# Patient Record
Sex: Male | Born: 1940
Health system: Southern US, Community
[De-identification: ages and names within clinical notes are randomized; demographics above are authoritative.]

## PROBLEM LIST (undated history)

## (undated) DIAGNOSIS — E538 Deficiency of other specified B group vitamins: Secondary | ICD-10-CM

## (undated) DIAGNOSIS — M109 Gout, unspecified: Secondary | ICD-10-CM

## (undated) DIAGNOSIS — I509 Heart failure, unspecified: Secondary | ICD-10-CM

## (undated) DIAGNOSIS — I219 Acute myocardial infarction, unspecified: Secondary | ICD-10-CM

## (undated) DIAGNOSIS — I10 Essential (primary) hypertension: Secondary | ICD-10-CM

## (undated) DIAGNOSIS — E119 Type 2 diabetes mellitus without complications: Secondary | ICD-10-CM

## (undated) DIAGNOSIS — R05 Cough: Secondary | ICD-10-CM

## (undated) DIAGNOSIS — R0902 Hypoxemia: Secondary | ICD-10-CM

## (undated) DIAGNOSIS — J42 Unspecified chronic bronchitis: Secondary | ICD-10-CM

## (undated) DIAGNOSIS — M1990 Unspecified osteoarthritis, unspecified site: Secondary | ICD-10-CM

## (undated) DIAGNOSIS — M545 Low back pain, unspecified: Secondary | ICD-10-CM

## (undated) DIAGNOSIS — M1710 Unilateral primary osteoarthritis, unspecified knee: Secondary | ICD-10-CM

## (undated) DIAGNOSIS — J189 Pneumonia, unspecified organism: Secondary | ICD-10-CM

## (undated) DIAGNOSIS — R269 Unspecified abnormalities of gait and mobility: Secondary | ICD-10-CM

## (undated) DIAGNOSIS — I2510 Atherosclerotic heart disease of native coronary artery without angina pectoris: Secondary | ICD-10-CM

## (undated) DIAGNOSIS — E785 Hyperlipidemia, unspecified: Secondary | ICD-10-CM

## (undated) DIAGNOSIS — I639 Cerebral infarction, unspecified: Secondary | ICD-10-CM

## (undated) DIAGNOSIS — G4733 Obstructive sleep apnea (adult) (pediatric): Secondary | ICD-10-CM

## (undated) DIAGNOSIS — J449 Chronic obstructive pulmonary disease, unspecified: Secondary | ICD-10-CM

## (undated) DIAGNOSIS — L409 Psoriasis, unspecified: Secondary | ICD-10-CM

## (undated) DIAGNOSIS — K219 Gastro-esophageal reflux disease without esophagitis: Secondary | ICD-10-CM

## (undated) DIAGNOSIS — E079 Disorder of thyroid, unspecified: Secondary | ICD-10-CM

## (undated) DIAGNOSIS — Z9989 Dependence on other enabling machines and devices: Secondary | ICD-10-CM

## (undated) DIAGNOSIS — E669 Obesity, unspecified: Secondary | ICD-10-CM

## (undated) DIAGNOSIS — G8929 Other chronic pain: Secondary | ICD-10-CM

## (undated) HISTORY — DX: Unilateral primary osteoarthritis, unspecified knee: M17.10

## (undated) HISTORY — PX: CORONARY ANGIOPLASTY WITH STENT PLACEMENT: SHX49

## (undated) HISTORY — DX: Essential (primary) hypertension: I10

## (undated) HISTORY — DX: Hypoxemia: R09.02

## (undated) HISTORY — DX: Gastro-esophageal reflux disease without esophagitis: K21.9

## (undated) HISTORY — PX: APPENDECTOMY: SHX54

## (undated) HISTORY — DX: Hyperlipidemia, unspecified: E78.5

## (undated) HISTORY — DX: Unspecified abnormalities of gait and mobility: R26.9

## (undated) HISTORY — DX: Psoriasis, unspecified: L40.9

## (undated) HISTORY — DX: Atherosclerotic heart disease of native coronary artery without angina pectoris: I25.10

## (undated) HISTORY — DX: Deficiency of other specified B group vitamins: E53.8

## (undated) HISTORY — DX: Obesity, unspecified: E66.9

## (undated) HISTORY — DX: Disorder of thyroid, unspecified: E07.9

## (undated) HISTORY — DX: Cough: R05

## (undated) HISTORY — DX: Type 2 diabetes mellitus without complications: E11.9

## (undated) HISTORY — DX: Heart failure, unspecified: I50.9

---

## 1997-05-19 DIAGNOSIS — I219 Acute myocardial infarction, unspecified: Secondary | ICD-10-CM

## 1997-05-19 HISTORY — DX: Acute myocardial infarction, unspecified: I21.9

## 1997-09-08 ENCOUNTER — Ambulatory Visit (HOSPITAL_COMMUNITY): Admission: RE | Admit: 1997-09-08 | Discharge: 1997-09-08 | Payer: Self-pay | Admitting: Cardiology

## 1998-11-05 ENCOUNTER — Ambulatory Visit (HOSPITAL_COMMUNITY): Admission: RE | Admit: 1998-11-05 | Discharge: 1998-11-05 | Payer: Self-pay | Admitting: Cardiology

## 1998-11-05 ENCOUNTER — Encounter: Payer: Self-pay | Admitting: Cardiology

## 2000-12-03 ENCOUNTER — Ambulatory Visit: Admission: RE | Admit: 2000-12-03 | Discharge: 2000-12-03 | Payer: Self-pay | Admitting: *Deleted

## 2002-02-11 ENCOUNTER — Encounter: Payer: Self-pay | Admitting: Cardiology

## 2002-02-11 ENCOUNTER — Ambulatory Visit (HOSPITAL_COMMUNITY): Admission: RE | Admit: 2002-02-11 | Discharge: 2002-02-11 | Payer: Self-pay | Admitting: Cardiology

## 2002-03-01 ENCOUNTER — Ambulatory Visit (HOSPITAL_COMMUNITY): Admission: RE | Admit: 2002-03-01 | Discharge: 2002-03-02 | Payer: Self-pay | Admitting: Cardiology

## 2002-03-17 ENCOUNTER — Ambulatory Visit (HOSPITAL_COMMUNITY): Admission: RE | Admit: 2002-03-17 | Discharge: 2002-03-18 | Payer: Self-pay | Admitting: Cardiovascular Disease

## 2002-12-12 ENCOUNTER — Ambulatory Visit (HOSPITAL_COMMUNITY): Admission: RE | Admit: 2002-12-12 | Discharge: 2002-12-12 | Payer: Self-pay | Admitting: Cardiology

## 2002-12-12 ENCOUNTER — Encounter: Payer: Self-pay | Admitting: Cardiology

## 2003-10-29 ENCOUNTER — Emergency Department (HOSPITAL_COMMUNITY): Admission: EM | Admit: 2003-10-29 | Discharge: 2003-10-29 | Payer: Self-pay | Admitting: Emergency Medicine

## 2004-04-18 ENCOUNTER — Ambulatory Visit: Payer: Self-pay | Admitting: Family Medicine

## 2004-05-27 ENCOUNTER — Ambulatory Visit: Payer: Self-pay | Admitting: Family Medicine

## 2004-05-28 ENCOUNTER — Ambulatory Visit (HOSPITAL_COMMUNITY): Admission: RE | Admit: 2004-05-28 | Discharge: 2004-05-28 | Payer: Self-pay | Admitting: Family Medicine

## 2004-06-18 ENCOUNTER — Ambulatory Visit (HOSPITAL_COMMUNITY): Admission: RE | Admit: 2004-06-18 | Discharge: 2004-06-18 | Payer: Self-pay | Admitting: Pulmonary Disease

## 2004-08-22 ENCOUNTER — Ambulatory Visit: Payer: Self-pay | Admitting: Family Medicine

## 2004-09-09 ENCOUNTER — Ambulatory Visit (HOSPITAL_COMMUNITY): Admission: RE | Admit: 2004-09-09 | Discharge: 2004-09-09 | Payer: Self-pay | Admitting: Pulmonary Disease

## 2004-12-09 ENCOUNTER — Ambulatory Visit: Payer: Self-pay | Admitting: Family Medicine

## 2004-12-31 ENCOUNTER — Emergency Department (HOSPITAL_COMMUNITY): Admission: EM | Admit: 2004-12-31 | Discharge: 2004-12-31 | Payer: Self-pay | Admitting: Emergency Medicine

## 2005-03-18 ENCOUNTER — Ambulatory Visit: Payer: Self-pay | Admitting: Family Medicine

## 2005-03-27 ENCOUNTER — Encounter: Admission: RE | Admit: 2005-03-27 | Discharge: 2005-03-27 | Payer: Self-pay | Admitting: Cardiology

## 2005-05-27 ENCOUNTER — Ambulatory Visit: Payer: Self-pay | Admitting: Family Medicine

## 2005-06-17 ENCOUNTER — Ambulatory Visit: Payer: Self-pay | Admitting: Family Medicine

## 2005-08-26 ENCOUNTER — Ambulatory Visit: Payer: Self-pay | Admitting: Family Medicine

## 2005-10-09 ENCOUNTER — Ambulatory Visit: Payer: Self-pay | Admitting: Family Medicine

## 2005-12-11 ENCOUNTER — Ambulatory Visit: Payer: Self-pay | Admitting: Family Medicine

## 2006-03-13 ENCOUNTER — Ambulatory Visit: Payer: Self-pay | Admitting: Family Medicine

## 2006-03-17 ENCOUNTER — Inpatient Hospital Stay (HOSPITAL_BASED_OUTPATIENT_CLINIC_OR_DEPARTMENT_OTHER): Admission: RE | Admit: 2006-03-17 | Discharge: 2006-03-17 | Payer: Self-pay | Admitting: Cardiology

## 2006-04-24 ENCOUNTER — Ambulatory Visit: Payer: Self-pay | Admitting: Family Medicine

## 2006-06-16 ENCOUNTER — Encounter: Payer: Self-pay | Admitting: Family Medicine

## 2006-06-16 LAB — CONVERTED CEMR LAB
AST: 12 units/L (ref 0–37)
BUN: 14 mg/dL (ref 6–23)
Bilirubin, Direct: 0.1 mg/dL (ref 0.0–0.3)
CO2: 24 meq/L (ref 19–32)
Cholesterol: 147 mg/dL (ref 0–200)
Creatinine, Ser: 1.12 mg/dL (ref 0.40–1.50)
Hgb A1c MFr Bld: 7.7 % — ABNORMAL HIGH (ref 4.6–6.1)
Indirect Bilirubin: 0.2 mg/dL (ref 0.0–0.9)
Potassium: 4.2 meq/L (ref 3.5–5.3)
Sodium: 140 meq/L (ref 135–145)
Total Bilirubin: 0.3 mg/dL (ref 0.3–1.2)
Total CHOL/HDL Ratio: 5.3
Triglycerides: 155 mg/dL — ABNORMAL HIGH (ref <150)

## 2006-06-18 ENCOUNTER — Ambulatory Visit: Payer: Self-pay | Admitting: Family Medicine

## 2006-07-30 ENCOUNTER — Ambulatory Visit: Payer: Self-pay | Admitting: Family Medicine

## 2006-12-24 ENCOUNTER — Ambulatory Visit: Payer: Self-pay | Admitting: Family Medicine

## 2006-12-29 ENCOUNTER — Encounter: Payer: Self-pay | Admitting: Family Medicine

## 2006-12-29 LAB — CONVERTED CEMR LAB: Microalb, Ur: 1.07 mg/dL (ref 0.00–1.89)

## 2007-03-31 ENCOUNTER — Encounter: Payer: Self-pay | Admitting: Family Medicine

## 2007-03-31 LAB — CONVERTED CEMR LAB
ALT: 16 units/L (ref 0–53)
Albumin: 4.4 g/dL (ref 3.5–5.2)
BUN: 16 mg/dL (ref 6–23)
Basophils Relative: 1 % (ref 0–1)
Bilirubin, Direct: 0.1 mg/dL (ref 0.0–0.3)
CO2: 22 meq/L (ref 19–32)
Calcium: 9.1 mg/dL (ref 8.4–10.5)
Cholesterol: 178 mg/dL (ref 0–200)
Eosinophils Absolute: 0.3 10*3/uL (ref 0.2–0.7)
Eosinophils Relative: 3 % (ref 0–5)
Indirect Bilirubin: 0.3 mg/dL (ref 0.0–0.9)
Lymphocytes Relative: 31 % (ref 12–46)
Platelets: 257 10*3/uL (ref 150–400)
Total Bilirubin: 0.4 mg/dL (ref 0.3–1.2)
Total CHOL/HDL Ratio: 5.4
Total Protein: 7.5 g/dL (ref 6.0–8.3)
WBC: 10.8 10*3/uL — ABNORMAL HIGH (ref 4.0–10.5)

## 2007-04-07 ENCOUNTER — Ambulatory Visit: Payer: Self-pay | Admitting: Family Medicine

## 2007-07-21 ENCOUNTER — Ambulatory Visit: Payer: Self-pay | Admitting: Family Medicine

## 2007-08-30 ENCOUNTER — Ambulatory Visit: Payer: Self-pay | Admitting: Family Medicine

## 2007-08-30 LAB — CONVERTED CEMR LAB
AST: 13 units/L (ref 0–37)
Alkaline Phosphatase: 67 units/L (ref 39–117)
HDL: 32 mg/dL — ABNORMAL LOW (ref >39)
Total Bilirubin: 0.4 mg/dL (ref 0.3–1.2)
Total CHOL/HDL Ratio: 6.5
Total Protein: 7 g/dL (ref 6.0–8.3)
VLDL: 30 mg/dL (ref 0–40)

## 2007-09-29 ENCOUNTER — Ambulatory Visit: Payer: Self-pay | Admitting: Family Medicine

## 2007-10-05 DIAGNOSIS — M1711 Unilateral primary osteoarthritis, right knee: Secondary | ICD-10-CM | POA: Insufficient documentation

## 2007-10-05 DIAGNOSIS — I10 Essential (primary) hypertension: Secondary | ICD-10-CM

## 2007-10-05 DIAGNOSIS — E109 Type 1 diabetes mellitus without complications: Secondary | ICD-10-CM | POA: Insufficient documentation

## 2007-10-05 DIAGNOSIS — E785 Hyperlipidemia, unspecified: Secondary | ICD-10-CM | POA: Insufficient documentation

## 2007-10-05 DIAGNOSIS — I2510 Atherosclerotic heart disease of native coronary artery without angina pectoris: Secondary | ICD-10-CM

## 2007-10-05 DIAGNOSIS — K219 Gastro-esophageal reflux disease without esophagitis: Secondary | ICD-10-CM | POA: Insufficient documentation

## 2007-10-27 ENCOUNTER — Ambulatory Visit: Payer: Self-pay | Admitting: Family Medicine

## 2007-11-15 ENCOUNTER — Ambulatory Visit (HOSPITAL_COMMUNITY): Admission: RE | Admit: 2007-11-15 | Discharge: 2007-11-15 | Payer: Self-pay | Admitting: Orthopedic Surgery

## 2007-11-15 ENCOUNTER — Ambulatory Visit: Payer: Self-pay | Admitting: Orthopedic Surgery

## 2007-12-15 ENCOUNTER — Ambulatory Visit: Payer: Self-pay | Admitting: Orthopedic Surgery

## 2008-02-05 ENCOUNTER — Encounter: Payer: Self-pay | Admitting: Family Medicine

## 2008-02-14 ENCOUNTER — Ambulatory Visit: Payer: Self-pay | Admitting: Family Medicine

## 2008-02-14 LAB — CONVERTED CEMR LAB
Glucose, Bld: 134 mg/dL
Hgb A1c MFr Bld: 6.8 %

## 2008-02-16 LAB — CONVERTED CEMR LAB
ALT: 10 units/L (ref 0–53)
Albumin: 3.9 g/dL (ref 3.5–5.2)
Alkaline Phosphatase: 84 units/L (ref 39–117)
BUN: 12 mg/dL (ref 6–23)
CO2: 24 meq/L (ref 19–32)
Chloride: 103 meq/L (ref 96–112)
Creatinine, Ser: 1.01 mg/dL (ref 0.40–1.50)
Total Bilirubin: 0.3 mg/dL (ref 0.3–1.2)
Total CHOL/HDL Ratio: 6.9

## 2008-03-01 ENCOUNTER — Ambulatory Visit: Payer: Self-pay | Admitting: Family Medicine

## 2008-07-07 ENCOUNTER — Encounter (HOSPITAL_COMMUNITY): Admission: RE | Admit: 2008-07-07 | Discharge: 2008-10-05 | Payer: Self-pay | Admitting: Cardiology

## 2008-11-06 ENCOUNTER — Ambulatory Visit: Payer: Self-pay | Admitting: Family Medicine

## 2008-11-06 LAB — CONVERTED CEMR LAB: Glucose, Bld: 149 mg/dL

## 2008-11-21 ENCOUNTER — Emergency Department (HOSPITAL_COMMUNITY): Admission: EM | Admit: 2008-11-21 | Discharge: 2008-11-22 | Payer: Self-pay | Admitting: Emergency Medicine

## 2008-11-28 ENCOUNTER — Ambulatory Visit: Payer: Self-pay | Admitting: Family Medicine

## 2008-11-28 DIAGNOSIS — E1142 Type 2 diabetes mellitus with diabetic polyneuropathy: Secondary | ICD-10-CM

## 2008-12-05 ENCOUNTER — Encounter: Payer: Self-pay | Admitting: Family Medicine

## 2008-12-05 LAB — CONVERTED CEMR LAB
Albumin: 4.2 g/dL (ref 3.5–5.2)
Alkaline Phosphatase: 61 units/L (ref 39–117)
Bilirubin, Direct: 0.1 mg/dL (ref 0.0–0.3)
Cholesterol: 188 mg/dL (ref 0–200)
Creatinine, Ser: 1.01 mg/dL (ref 0.40–1.50)
Creatinine, Urine: 101.9 mg/dL
Eosinophils Absolute: 0.3 10*3/uL (ref 0.0–0.7)
Eosinophils Relative: 2 % (ref 0–5)
Glucose, Bld: 95 mg/dL (ref 70–99)
Indirect Bilirubin: 0.2 mg/dL (ref 0.0–0.9)
LDL Cholesterol: 91 mg/dL (ref 0–99)
MCHC: 30.8 g/dL (ref 30.0–36.0)
MCV: 86 fL (ref 78.0–100.0)
Neutro Abs: 7.4 10*3/uL (ref 1.7–7.7)
Neutrophils Relative %: 65 % (ref 43–77)
PSA: 0.67 ng/mL (ref 0.10–4.00)
Potassium: 4.2 meq/L (ref 3.5–5.3)
Total Bilirubin: 0.3 mg/dL (ref 0.3–1.2)
Total Protein: 7.3 g/dL (ref 6.0–8.3)
VLDL: 74 mg/dL — ABNORMAL HIGH (ref 0–40)
WBC: 11.3 10*3/uL — ABNORMAL HIGH (ref 4.0–10.5)

## 2008-12-13 ENCOUNTER — Ambulatory Visit: Payer: Self-pay | Admitting: Family Medicine

## 2009-04-05 ENCOUNTER — Ambulatory Visit: Payer: Self-pay | Admitting: Family Medicine

## 2009-04-19 ENCOUNTER — Encounter: Payer: Self-pay | Admitting: Family Medicine

## 2009-04-26 LAB — CONVERTED CEMR LAB
AST: 15 units/L (ref 0–37)
Alkaline Phosphatase: 61 units/L (ref 39–117)
CO2: 23 meq/L (ref 19–32)
Chloride: 103 meq/L (ref 96–112)
Cholesterol: 208 mg/dL — ABNORMAL HIGH (ref 0–200)
Creatinine, Ser: 0.93 mg/dL (ref 0.40–1.50)
LDL Cholesterol: 135 mg/dL — ABNORMAL HIGH (ref 0–99)
Sodium: 140 meq/L (ref 135–145)
Total Protein: 7.2 g/dL (ref 6.0–8.3)
Triglycerides: 217 mg/dL — ABNORMAL HIGH (ref <150)

## 2009-05-07 ENCOUNTER — Ambulatory Visit: Payer: Self-pay | Admitting: Family Medicine

## 2009-07-28 ENCOUNTER — Emergency Department (HOSPITAL_COMMUNITY): Admission: EM | Admit: 2009-07-28 | Discharge: 2009-07-28 | Payer: Self-pay | Admitting: Emergency Medicine

## 2009-08-16 ENCOUNTER — Ambulatory Visit: Payer: Self-pay | Admitting: Family Medicine

## 2009-09-05 LAB — CONVERTED CEMR LAB
Alkaline Phosphatase: 51 units/L (ref 39–117)
CO2: 23 meq/L (ref 19–32)
Chloride: 106 meq/L (ref 96–112)
Cholesterol: 175 mg/dL (ref 0–200)
Glucose, Bld: 121 mg/dL — ABNORMAL HIGH (ref 70–99)
Hgb A1c MFr Bld: 7.6 % — ABNORMAL HIGH (ref <5.7)
Indirect Bilirubin: 0.3 mg/dL (ref 0.0–0.9)
Potassium: 4.3 meq/L (ref 3.5–5.3)
Total Protein: 6.3 g/dL (ref 6.0–8.3)
VLDL: 54 mg/dL — ABNORMAL HIGH (ref 0–40)

## 2009-10-03 ENCOUNTER — Emergency Department (HOSPITAL_COMMUNITY): Admission: EM | Admit: 2009-10-03 | Discharge: 2009-10-03 | Payer: Self-pay | Admitting: Emergency Medicine

## 2009-11-06 ENCOUNTER — Encounter: Payer: Self-pay | Admitting: Family Medicine

## 2009-11-15 ENCOUNTER — Encounter: Payer: Self-pay | Admitting: Family Medicine

## 2009-11-15 ENCOUNTER — Emergency Department (HOSPITAL_COMMUNITY): Admission: EM | Admit: 2009-11-15 | Discharge: 2009-11-15 | Payer: Self-pay | Admitting: Emergency Medicine

## 2009-11-20 ENCOUNTER — Ambulatory Visit: Payer: Self-pay | Admitting: Family Medicine

## 2009-11-21 DIAGNOSIS — N3000 Acute cystitis without hematuria: Secondary | ICD-10-CM

## 2009-12-04 ENCOUNTER — Telehealth: Payer: Self-pay | Admitting: Family Medicine

## 2009-12-04 ENCOUNTER — Encounter: Payer: Self-pay | Admitting: Family Medicine

## 2009-12-07 ENCOUNTER — Ambulatory Visit (HOSPITAL_COMMUNITY): Admission: RE | Admit: 2009-12-07 | Discharge: 2009-12-07 | Payer: Self-pay | Admitting: Urology

## 2009-12-07 LAB — CONVERTED CEMR LAB
ALT: 11 units/L (ref 0–53)
Albumin: 4.1 g/dL (ref 3.5–5.2)
Alkaline Phosphatase: 45 units/L (ref 39–117)
BUN: 17 mg/dL (ref 6–23)
Bilirubin, Direct: 0.1 mg/dL (ref 0.0–0.3)
Chloride: 103 meq/L (ref 96–112)
Cholesterol: 212 mg/dL — ABNORMAL HIGH (ref 0–200)
Creatinine, Urine: 182.5 mg/dL
Glucose, Bld: 118 mg/dL — ABNORMAL HIGH (ref 70–99)
HDL: 30 mg/dL — ABNORMAL LOW (ref >39)
Hgb A1c MFr Bld: 6.5 % — ABNORMAL HIGH (ref <5.7)
Microalb, Ur: 0.5 mg/dL (ref 0.00–1.89)
PSA: 0.81 ng/mL (ref 0.10–4.00)
Total CHOL/HDL Ratio: 7.1
Total Protein: 7 g/dL (ref 6.0–8.3)
VLDL: 58 mg/dL — ABNORMAL HIGH (ref 0–40)

## 2010-01-06 ENCOUNTER — Emergency Department (HOSPITAL_COMMUNITY): Admission: EM | Admit: 2010-01-06 | Discharge: 2010-01-06 | Payer: Self-pay | Admitting: Emergency Medicine

## 2010-01-07 ENCOUNTER — Ambulatory Visit: Payer: Self-pay | Admitting: Family Medicine

## 2010-01-07 DIAGNOSIS — M79609 Pain in unspecified limb: Secondary | ICD-10-CM

## 2010-01-08 LAB — CONVERTED CEMR LAB: Sodium: 141 meq/L (ref 135–145)

## 2010-01-13 DIAGNOSIS — M109 Gout, unspecified: Secondary | ICD-10-CM

## 2010-01-16 ENCOUNTER — Encounter: Payer: Self-pay | Admitting: Family Medicine

## 2010-06-18 NOTE — Progress Notes (Signed)
Summary: EYE EXAM  EYE EXAM   Imported By: Lind Guest 11/07/2009 14:25:06  _____________________________________________________________________  External Attachment:    Type:   Image     Comment:   External Document

## 2010-06-18 NOTE — Assessment & Plan Note (Signed)
Summary: office visit   Vital Signs:  Patient profile:   70 year old male Height:      71 inches Weight:      223 pounds BMI:     31.21 O2 Sat:      91 % Pulse rate:   62 / minute Pulse rhythm:   regular Resp:     18 per minute BP sitting:   120 / 82  (left arm) Cuff size:   large  Vitals Entered By: Everitt Amber LPN (August 16, 2009 3:38 PM)  Nutrition Counseling: Patient's BMI is greater than 25 and therefore counseled on weight management options. CC: Follow up chronic problems Is Patient Diabetic? Yes   CC:  Follow up chronic problems.  History of Present Illness: Reports  that the has been doing well. Denies recent fever or chills. Denies sinus pressure, nasal congestion , ear pain or sore throat. Denies chest congestion, or cough productive of sputum. Denies chest pain, palpitations, PND, orthopnea or leg swelling. Denies abdominal pain, nausea, vomitting, diarrhea or constipation. Denies change in bowel movements or bloody stool. Denies dysuria , frequency, incontinence or hesitancy. Denies  joint pain, swelling, or reduced mobility. Denies headaches, vertigo, seizures. Denies depression, anxiety or insomnia. Denies  rash, lesions, or itch.     Current Medications (verified): 1)  Plavix 75 Mg  Tabs (Clopidogrel Bisulfate) .... One Tab By Mouth Once Daily 2)  Isosorbide Mononitrate Cr 60 Mg  Tb24 (Isosorbide Mononitrate) .... One Tab By Mouth Every Morning 3)  Lisinopril 40 Mg  Tabs (Lisinopril) .... One Tab By Mouth Bid 4)  Albuterol 90 Mcg/act  Aers (Albuterol) .... Uad 5)  Trilipix 135 Mg Cpdr (Choline Fenofibrate) .... Take 1 Tab By Mouth At Bedtime 6)  Metoprolol Tartrate 100 Mg Tabs (Metoprolol Tartrate) .... One Tab By Mouth Bid 7)  Metformin Hcl 1000 Mg Tabs (Metformin Hcl) .... Take 1 Tablet By Mouth Two Times A Day 8)  Amlodipine Besylate 10 Mg Tabs (Amlodipine Besylate) .... Take 1 Tablet By Mouth Once A Day 9)  Simvastatin 80 Mg Tabs (Simvastatin)  .... One Tab By Mouth Qhs 10)  Maxzide-25 37.5-25 Mg Tabs (Triamterene-Hctz) .... Take 1 Tablet By Mouth Once A Day  Allergies (verified): No Known Drug Allergies  Review of Systems      See HPI Eyes:  Denies discharge, eye pain, and red eye. Endo:  Denies cold intolerance, excessive hunger, excessive thirst, excessive urination, heat intolerance, polyuria, and weight change; tests oncer weekly  avrages  around 110. Heme:  Denies abnormal bruising and bleeding. Allergy:  Complains of seasonal allergies; denies hives or rash and itching eyes.  Physical Exam  General:  Well-developed,well-nourished,in no acute distress; alert,appropriate and cooperative throughout examination HEENT: No facial asymmetry,  EOMI, No sinus tenderness, TM's Clear, oropharynx  pink and moist.   Chest: Clear to auscultation bilaterally.  CVS: S1, S2, No murmurs, No S3.   Abd: Soft, Nontender.  MS: Adequate ROM spine, hips, shoulders and knees.  Ext: No edema.   CNS: CN 2-12 intact, power tone and sensation normal throughout.   Skin: Intact, no visible lesions or rashes.  Psych: Good eye contact, normal affect.  Memory intact, not anxious or depressed appearing.   Diabetes Management Exam:    Foot Exam (with socks and/or shoes not present):       Sensory-Monofilament:          Left foot: diminished          Right foot:  diminished       Inspection:          Left foot: normal          Right foot: normal       Nails:          Left foot: thickened          Right foot: thickened   Impression & Recommendations:  Problem # 1:  DIABETES MELLITUS, TYPE II (ICD-250.00) Assessment Comment Only  The following medications were removed from the medication list:    Onglyza 5 Mg Tabs (Saxagliptin hcl) .Marland Kitchen... Take 1 tablet by mouth once a day    Onglyza 5 Mg Tabs (Saxagliptin hcl) ..... One tab by mouth qd His updated medication list for this problem includes:    Lisinopril 40 Mg Tabs (Lisinopril) ..... One tab  by mouth bid    Metformin Hcl 1000 Mg Tabs (Metformin hcl) .Marland Kitchen... Take 1 tablet by mouth two times a day  Future Orders: Ophthalmology Referral (Ophthalmology) ... 08/17/2009  Labs Reviewed: Creat: 0.93 (04/25/2009)    Reviewed HgBA1c results: 6.3 (04/05/2009)  6.8 (11/06/2008)  Problem # 2:  OBESITY (ICD-278.00) Assessment: Unchanged  Ht: 71 (08/16/2009)   Wt: 223 (08/16/2009)   BMI: 31.21 (08/16/2009)  Problem # 3:  HYPERTENSION (ICD-401.9) Assessment: Improved  His updated medication list for this problem includes:    Lisinopril 40 Mg Tabs (Lisinopril) ..... One tab by mouth bid    Metoprolol Tartrate 100 Mg Tabs (Metoprolol tartrate) ..... One tab by mouth bid    Amlodipine Besylate 10 Mg Tabs (Amlodipine besylate) .Marland Kitchen... Take 1 tablet by mouth once a day    Maxzide-25 37.5-25 Mg Tabs (Triamterene-hctz) .Marland Kitchen... Take 1 tablet by mouth once a day  Orders: T-Basic Metabolic Panel [phone])  BP today: 120/82 Prior BP: 160/90 (05/07/2009)  Labs Reviewed: K+: 4.2 (04/25/2009) Creat: : 0.93 (04/25/2009)   Chol: 208 (04/25/2009)   HDL: 30 (04/25/2009)   LDL: 135 (04/25/2009)   TG: 217 (04/25/2009)  Problem # 4:  HYPERLIPIDEMIA (ICD-272.4) Assessment: Comment Only  His updated medication list for this problem includes:    Trilipix 135 Mg Cpdr (Choline fenofibrate) .Marland Kitchen... Take 1 tab by mouth at bedtime    Simvastatin 80 Mg Tabs (Simvastatin) ..... One tab by mouth qhs  Orders: T-Hepatic Function [id number]) T-Lipid Profile [id number])  Labs Reviewed: SGOT: 15 (04/25/2009)   SGPT: 20 (04/25/2009)   HDL:30 (04/25/2009), 23 (12/05/2008)  LDL:135 (04/25/2009), 91 (41/32/4401)  Chol:208 (04/25/2009), 188 (12/05/2008)  Trig:217 (04/25/2009), 371 (12/05/2008)  Complete Medication List: 1)  Plavix 75 Mg Tabs (Clopidogrel bisulfate) .... One tab by mouth once daily 2)  Isosorbide Mononitrate Cr 60 Mg Tb24 (Isosorbide mononitrate) .... One tab by mouth every morning 3)   Lisinopril 40 Mg Tabs (Lisinopril) .... One tab by mouth bid 4)  Albuterol 90 Mcg/act Aers (Albuterol) .... Uad 5)  Trilipix 135 Mg Cpdr (Choline fenofibrate) .... Take 1 tab by mouth at bedtime 6)  Metoprolol Tartrate 100 Mg Tabs (Metoprolol tartrate) .... One tab by mouth bid 7)  Metformin Hcl 1000 Mg Tabs (Metformin hcl) .... Take 1 tablet by mouth two times a day 8)  Amlodipine Besylate 10 Mg Tabs (Amlodipine besylate) .... Take 1 tablet by mouth once a day 9)  Simvastatin 80 Mg Tabs (Simvastatin) .... One tab by mouth qhs 10)  Maxzide-25 37.5-25 Mg Tabs (Triamterene-hctz) .... Take 1 tablet by mouth once a day  Other Orders: T- Hemoglobin A1C ([id number])  Patient Instructions: 1)  Please schedule a follow-up appointment in 3 .5months. 2)  Your Blood pressure is great, no med chnges. 3)  BMP prior to visit, ICD-9: 4)  Hepatic Panel prior to visit, ICD-9: 5)  Lipid Panel prior to visit, ICD-9:   fasting i the morning  6)  HbgA1C prior to visit, ICD-9: 7)  I amm glad that you are doing better, no med changes  Prescriptions: MAXZIDE-25 37.5-25 MG TABS (TRIAMTERENE-HCTZ) Take 1 tablet by mouth once a day  #90 x 3   Entered by:   Everitt Amber LPN   Authorized by:   Syliva Overman MD   Signed by:   Everitt Amber LPN on 16/02/9603   Method used:   Electronically to        [facility] [phone]* (retail)       [address]       [place], [place]  [address]       Ph: [phone] or [phone]       Fax: [phone]   RxID:   [id number] METOPROLOL TARTRATE 100 MG TABS (METOPROLOL TARTRATE) one tab by mouth bid  #180 Tablet x 3   Entered by:   Everitt Amber LPN   Authorized by:   Syliva Overman MD   Signed by:   Everitt Amber LPN on 74/25/9563   Method used:   Electronically to        [facility] [phone]* (retail)       [address]       [place], [place]  [address]       Ph: [phone] or [phone]       Fax: [phone]   RxID:    [id number] LISINOPRIL 40 MG  TABS (LISINOPRIL) one tab by mouth bid  #180 x 3   Entered by:   Everitt Amber LPN   Authorized by:   Syliva Overman MD   Signed by:   Everitt Amber LPN on 61/60/7371   Method used:   Electronically to        [facility] [phone]* (retail)       [address]       [place], [place]  [address]       Ph: [phone] or [phone]       Fax: [phone]   RxID:   [id number]

## 2010-06-18 NOTE — Assessment & Plan Note (Signed)
Summary: F UP FROM ED WANTED TO COME IN TODAY   Vital Signs:  Patient profile:   70 year old male Height:      71 inches O2 Sat:      93 % on Room air Pulse rate:   88 / minute Pulse rhythm:   regular Resp:     16 per minute BP sitting:   130 / 62  (left arm)  Vitals Entered By: Adella Hare LPN (January 07, 2010 4:02 PM)  O2 Flow:  Room air CC: gout flare left foot Is Patient Diabetic? Yes Did you bring your meter with you today? Yes Pain Assessment Patient in pain? yes     Location: left foot Intensity: 10 Type: sharp Onset of pain  Constant   CC:  gout flare left foot.  History of Present Illness: Pt was seen in the Ed recently for acute gout , and states that he is still experiencing alot of pain and swelling of the affected foot. He has still not yet filled the prescription given in the ED .  He states that that his blood sugars continue to run well, with no reported hypo or hyperglycemic episodes. He denies any redcent fever or chills. He denies head or chest congestion. He denies abdominal pain, diarreah , constipation , nausea or vomitting.   Current Medications (verified): 1)  Plavix 75 Mg  Tabs (Clopidogrel Bisulfate) .... One Tab By Mouth Once Daily 2)  Isosorbide Mononitrate Cr 60 Mg  Tb24 (Isosorbide Mononitrate) .... One Tab By Mouth Every Morning 3)  Albuterol 90 Mcg/act  Aers (Albuterol) .... Uad 4)  Trilipix 135 Mg Cpdr (Choline Fenofibrate) .... Take 1 Tab By Mouth At Bedtime 5)  Metoprolol Tartrate 100 Mg Tabs (Metoprolol Tartrate) .... One Tab By Mouth Bid 6)  Janumet 50-1000 Mg Tabs (Sitagliptin-Metformin Hcl) .... Take 1 Tablet By Mouth Two Times A Day 7)  Amlodipine Besylate 10 Mg Tabs (Amlodipine Besylate) .... Take 1 Tablet By Mouth Once A Day 8)  Simvastatin 80 Mg Tabs (Simvastatin) .... One Tab By Mouth Qhs 9)  Maxzide-25 37.5-25 Mg Tabs (Triamterene-Hctz) .... Take 1 Tablet By Mouth Once A Day 10)  Benicar 40 Mg Tabs (Olmesartan Medoxomil)  .... One Tab By Mouth Once Daily 11)  Aspirin 325 Mg Tabs (Aspirin) .... One Tab By Mouth Once Daily 12)  Allopurinol 300 Mg Tabs (Allopurinol) .... Take 1 Tablet By Mouth Once A Day  Allergies (verified): No Known Drug Allergies  Review of Systems      See HPI General:  Complains of fatigue. Eyes:  Denies blurring and discharge. GU:  Denies dysuria, incontinence, and urinary frequency. Derm:  Complains of itching, lesion(s), and rash. Heme:  Denies abnormal bruising and bleeding. Allergy:  Denies hives or rash and itching eyes.  Physical Exam  General:  Well-developed,well-nourished,in no acute distress; alert,appropriate and cooperative throughout examination HEENT: No facial asymmetry,  EOMI, No sinus tenderness, TM's Clear, oropharynx  pink and moist.   Chest: Clear to auscultation bilaterally.  CVS: S1, S2, No murmurs, No S3.   Abd: Soft, Nontender.  MS: Adequate ROM spine, hips, shoulders and knees. Swelling and tenderness of left foot, with warmth , redness AND REDUCED MOBILITY. Ext: No edema.   CNS: CN 2-12 intact, power tone and sensation normal throughout.   Skin: Intact, no visible lesions or rashes.  Psych: Good eye contact, normal affect.  Memory intact, not anxious or depressed appearing.    Impression & Recommendations:  Problem #  1:  FOOT PAIN, LEFT (ICD-729.5) Assessment Comment Only  Orders: Depo- Medrol 80mg  (J1040) Ketorolac-Toradol 15mg  (F6213) Admin of Therapeutic Inj  intramuscular or subcutaneous (08657)  Problem # 2:  DIABETES MELLITUS, TYPE II (ICD-250.00) Assessment: Comment Only  His updated medication list for this problem includes:    Janumet 50-1000 Mg Tabs (Sitagliptin-metformin hcl) .Marland Kitchen... Take 1 tablet by mouth two times a day    Benicar 40 Mg Tabs (Olmesartan medoxomil) ..... One tab by mouth once daily    Aspirin 325 Mg Tabs (Aspirin) ..... One tab by mouth once daily  Labs Reviewed: Creat: 1.14 (12/06/2009)    Reviewed HgBA1c  results: 6.5 (12/06/2009)  7.6 (08/29/2009)  Problem # 3:  HYPERLIPIDEMIA (ICD-272.4) Assessment: Deteriorated  The following medications were removed from the medication list:    Niaspan 500 Mg Cr-tabs (Niacin (antihyperlipidemic)) .Marland Kitchen... 2 tabs at bedtime (begin with one tab daily for 5 days then increase to two) His updated medication list for this problem includes:    Trilipix 135 Mg Cpdr (Choline fenofibrate) .Marland Kitchen... Take 1 tab by mouth at bedtime    Simvastatin 80 Mg Tabs (Simvastatin) ..... One tab by mouth qhs  Labs Reviewed: SGOT: 12 (12/06/2009)   SGPT: 11 (12/06/2009)   HDL:30 (12/06/2009), 29 (08/29/2009)  LDL:124 (12/06/2009), 92 (84/69/6295)  Chol:212 (12/06/2009), 175 (08/29/2009)  Trig:292 (12/06/2009), 270 (08/29/2009)  Problem # 4:  HYPERTENSION (ICD-401.9) Assessment: Unchanged  His updated medication list for this problem includes:    Metoprolol Tartrate 100 Mg Tabs (Metoprolol tartrate) ..... One tab by mouth bid    Amlodipine Besylate 10 Mg Tabs (Amlodipine besylate) .Marland Kitchen... Take 1 tablet by mouth once a day    Maxzide-25 37.5-25 Mg Tabs (Triamterene-hctz) .Marland Kitchen... Take 1 tablet by mouth once a day    Benicar 40 Mg Tabs (Olmesartan medoxomil) ..... One tab by mouth once daily  Orders: T-Basic Metabolic Panel [id number])  BP today: 130/62 Prior BP: 118/70 (11/20/2009)  Labs Reviewed: K+: 4.1 (12/06/2009) Creat: : 1.14 (12/06/2009)   Chol: 212 (12/06/2009)   HDL: 30 (12/06/2009)   LDL: 124 (12/06/2009)   TG: 292 (12/06/2009)  Problem # 5:  GOUT, UNSPECIFIED (ICD-274.9) Assessment: Deteriorated  His updated medication list for this problem includes:    Allopurinol 300 Mg Tabs (Allopurinol) .Marland Kitchen... Take 1 tablet by mouth once a day  Orders: T-Uric Acid (Blood) ([id number])  Complete Medication List: 1)  Plavix 75 Mg Tabs (Clopidogrel bisulfate) .... One tab by mouth once daily 2)  Isosorbide Mononitrate Cr 60 Mg Tb24 (Isosorbide mononitrate) .... One tab by  mouth every morning 3)  Albuterol 90 Mcg/act Aers (Albuterol) .... Uad 4)  Trilipix 135 Mg Cpdr (Choline fenofibrate) .... Take 1 tab by mouth at bedtime 5)  Metoprolol Tartrate 100 Mg Tabs (Metoprolol tartrate) .... One tab by mouth bid 6)  Janumet 50-1000 Mg Tabs (Sitagliptin-metformin hcl) .... Take 1 tablet by mouth two times a day 7)  Amlodipine Besylate 10 Mg Tabs (Amlodipine besylate) .... Take 1 tablet by mouth once a day 8)  Simvastatin 80 Mg Tabs (Simvastatin) .... One tab by mouth qhs 9)  Maxzide-25 37.5-25 Mg Tabs (Triamterene-hctz) .... Take 1 tablet by mouth once a day 10)  Benicar 40 Mg Tabs (Olmesartan medoxomil) .... One tab by mouth once daily 11)  Aspirin 325 Mg Tabs (Aspirin) .... One tab by mouth once daily 12)  Allopurinol 300 Mg Tabs (Allopurinol) .... Take 1 tablet by mouth once a day 13)  Indomethacin 25 Mg Caps (Indomethacin) .Marland KitchenMarland KitchenMarland Kitchen  1 tablet 3 times daily for2 days, then twice daily for 2 days then 1 daily  Patient Instructions: 1)  F/U 10/ 21 /2011 or after. 2)  You are being treated for acute gout of left foot . 3)  You will get 2 injections in the office as well as prescriptions today. 4)  You need these labs today 5)  BMP prior to visit, ICD-9: and uric acid 6)  Plscall in 2 days if no better. 7)  Work excuse to return 01/14/2010 Prescriptions: PREDNISONE (PAK) 5 MG TABS (PREDNISONE) Use as directed  #21 x 0   Entered by:   Adella Hare LPN   Authorized by:   Syliva Overman MD   Signed by:   Adella Hare LPN on 16/02/9603   Method used:   Print then Give to Patient   RxID:   [id number] INDOMETHACIN 25 MG CAPS (INDOMETHACIN) 1 tablet 3 times daily for2 days, then twice daily for 2 days then 1 daily  #13 x 0   Entered and Authorized by:   Syliva Overman MD   Signed by:   Syliva Overman MD on 01/07/2010   Method used:   Print then Give to Patient   RxID:   [id number] PREDNISONE (PAK) 5 MG TABS (PREDNISONE) Use as directed  #21 x 0   Entered  and Authorized by:   Syliva Overman MD   Signed by:   Syliva Overman MD on 01/07/2010   Method used:   Electronically to        [facility] [phone]* (retail)       [address]       [place], [place]  [address]       Ph: [phone] or [phone]       Fax: [phone]   RxID:   [id number]    Medication Administration  Injection # 1:    Medication: Depo- Medrol 80mg     Diagnosis: FOOT PAIN, LEFT (ICD-729.5)    Route: IM    Site: RUOQ gluteus    Exp Date: 4/12    Lot #: [id number]    Mfr: [employer]    Patient tolerated injection without complications    Given by: Adella Hare LPN (January 07, 2010 4:57 PM)  Injection # 2:    Medication: Ketorolac-Toradol 15mg     Diagnosis: FOOT PAIN, LEFT (ICD-729.5)    Route: IM    Site: LUOQ gluteus    Exp Date: 07/18/2011    Lot #: [id number]    Mfr: [email or web address]    Comments: toradol 60mg  given    Patient tolerated injection without complications    Given by: Adella Hare LPN (January 07, 2010 4:58 PM)  Orders Added: 1)  Est. Patient Level IV [57322] 2)  T-Basic Metabolic Panel [[id number]] 3)  T-Uric Acid (Blood) [[id number]] 4)  Depo- Medrol 80mg  [J1040] 5)  Ketorolac-Toradol 15mg  [J1885] 6)  Admin of Therapeutic Inj  intramuscular or subcutaneous [16073]

## 2010-06-18 NOTE — Assessment & Plan Note (Signed)
Summary: office visit   Vital Signs:  Patient profile:   70 year old male Height:      71 inches Weight:      225.50 pounds BMI:     31.56 O2 Sat:      93 % on Room air Pulse rate:   74 / minute Pulse rhythm:   regular Resp:     16 per minute BP sitting:   118 / 70  (left arm)  Vitals Entered By: Adella Hare LPN (November 20, 1608 4:04 PM)  Nutrition Counseling: Patient's BMI is greater than 25 and therefore counseled on weight management options.  O2 Flow:  Room air CC: follow-up visit Is Patient Diabetic? Yes Did you bring your meter with you today? No Pain Assessment Patient in pain? no      Comments CIPRO HYDROCODONE 5/325 ZOFRAN 8MG  - ORDERED FROM ER WITH NO REFILLS   CC:  follow-up visit.  History of Present Illness: Treated foruTI in the Ed 5 days ago, he had fever anfd chills and lower abd pain, no dysuria or frequency, no h/o kidney stones, he feels much better now , and is appropriately on cipro for the problem. Prior to this he had been well. A recent visit to cardiology resulted in achange from lisinopril to benicar due to cough.   Denies sinus pressure, nasal congestion , ear pain or sore throat. Denies chest congestion, or cough productive of sputum. Denies chest pain, palpitations, PND, orthopnea or leg swelling. Denies abdominal pain, nausea, vomitting, diarrhea or constipation. .Denies change in bowel movements or bloody stool. Denies dysuria , frequency, incontinence or hesitancy. Denies  joint pain, swelling, or reduced mobility. Denies headaches, vertigo, seizures. Denies depression, anxiety or insomnia. Denies  rash, lesions, or itch. Pt checks blood sugars regularly and they are generally less than 130     Current Medications (verified): 1)  Plavix 75 Mg  Tabs (Clopidogrel Bisulfate) .... One Tab By Mouth Once Daily 2)  Isosorbide Mononitrate Cr 60 Mg  Tb24 (Isosorbide Mononitrate) .... One Tab By Mouth Every Morning 3)  Albuterol 90  Mcg/act  Aers (Albuterol) .... Uad 4)  Trilipix 135 Mg Cpdr (Choline Fenofibrate) .... Take 1 Tab By Mouth At Bedtime 5)  Metoprolol Tartrate 100 Mg Tabs (Metoprolol Tartrate) .... One Tab By Mouth Bid 6)  Janumet 50-1000 Mg Tabs (Sitagliptin-Metformin Hcl) .... Take 1 Tablet By Mouth Two Times A Day 7)  Amlodipine Besylate 10 Mg Tabs (Amlodipine Besylate) .... Take 1 Tablet By Mouth Once A Day 8)  Simvastatin 80 Mg Tabs (Simvastatin) .... One Tab By Mouth Qhs 9)  Maxzide-25 37.5-25 Mg Tabs (Triamterene-Hctz) .... Take 1 Tablet By Mouth Once A Day 10)  Benicar 40 Mg Tabs (Olmesartan Medoxomil) .... One Tab By Mouth Once Daily 11)  Aspirin 325 Mg Tabs (Aspirin) .... One Tab By Mouth Once Daily  Allergies (verified): No Known Drug Allergies  Review of Systems      See HPI Eyes:  Denies discharge, eye pain, and red eye. Endo:  Denies cold intolerance, excessive hunger, excessive thirst, excessive urination, heat intolerance, polyuria, and weight change. Heme:  Denies abnormal bruising and bleeding. Allergy:  Denies hives or rash and itching eyes.  Physical Exam  General:  Well-developed,well-nourished,in no acute distress; alert,appropriate and cooperative throughout examination HEENT: No facial asymmetry,  EOMI, No sinus tenderness, TM's Clear, oropharynx  pink and moist.   Chest: Clear to auscultation bilaterally.  CVS: S1, S2, No murmurs, No S3.  Abd: Soft, Nontender.  MS: Adequate ROM spine, hips, shoulders and knees.  Ext: No edema.   CNS: CN 2-12 intact, power tone and sensation normal throughout.   Skin: Intact, no visible lesions or rashes.  Psych: Good eye contact, normal affect.  Memory intact, not anxious or depressed appearing.    Impression & Recommendations:  Problem # 1:  DIABETES MELLITUS, TYPE II (ICD-250.00) Assessment Improved  The following medications were removed from the medication list:    Lisinopril 40 Mg Tabs (Lisinopril) ..... One tab by mouth  bid His updated medication list for this problem includes:    Janumet 50-1000 Mg Tabs (Sitagliptin-metformin hcl) .Marland Kitchen... Take 1 tablet by mouth two times a day    Benicar 40 Mg Tabs (Olmesartan medoxomil) ..... One tab by mouth once daily    Aspirin 325 Mg Tabs (Aspirin) ..... One tab by mouth once daily  Orders: T- Hemoglobin A1C ([address]-60454) T-Urine Microalbumin w/creat. ratio [phone])  Labs Reviewed: Creat: 1.35 (08/29/2009)    Reviewed HgBA1c results: 7.6 (08/29/2009)  6.3 (04/05/2009)  Problem # 2:  CAD (ICD-414.00) Assessment: Improved  The following medications were removed from the medication list:    Lisinopril 40 Mg Tabs (Lisinopril) ..... One tab by mouth bid His updated medication list for this problem includes:    Plavix 75 Mg Tabs (Clopidogrel bisulfate) ..... One tab by mouth once daily    Isosorbide Mononitrate Cr 60 Mg Tb24 (Isosorbide mononitrate) ..... One tab by mouth every morning    Metoprolol Tartrate 100 Mg Tabs (Metoprolol tartrate) ..... One tab by mouth bid    Amlodipine Besylate 10 Mg Tabs (Amlodipine besylate) .Marland Kitchen... Take 1 tablet by mouth once a day    Maxzide-25 37.5-25 Mg Tabs (Triamterene-hctz) .Marland Kitchen... Take 1 tablet by mouth once a day    Benicar 40 Mg Tabs (Olmesartan medoxomil) ..... One tab by mouth once daily    Aspirin 325 Mg Tabs (Aspirin) ..... One tab by mouth once daily  Problem # 3:  HYPERTENSION (ICD-401.9) Assessment: Unchanged  The following medications were removed from the medication list:    Lisinopril 40 Mg Tabs (Lisinopril) ..... One tab by mouth bid His updated medication list for this problem includes:    Metoprolol Tartrate 100 Mg Tabs (Metoprolol tartrate) ..... One tab by mouth bid    Amlodipine Besylate 10 Mg Tabs (Amlodipine besylate) .Marland Kitchen... Take 1 tablet by mouth once a day    Maxzide-25 37.5-25 Mg Tabs (Triamterene-hctz) .Marland Kitchen... Take 1 tablet by mouth once a day    Benicar 40 Mg Tabs (Olmesartan medoxomil) ..... One  tab by mouth once daily  Orders: T-Basic Metabolic Panel ([id number])  BP today: 118/70 Prior BP: 120/82 (08/16/2009)  Labs Reviewed: K+: 4.3 (08/29/2009) Creat: : 1.35 (08/29/2009)   Chol: 175 (08/29/2009)   HDL: 29 (08/29/2009)   LDL: 92 (08/29/2009)   TG: 270 (08/29/2009)  Problem # 4:  HYPERLIPIDEMIA (ICD-272.4) Assessment: Unchanged  His updated medication list for this problem includes:    Trilipix 135 Mg Cpdr (Choline fenofibrate) .Marland Kitchen... Take 1 tab by mouth at bedtime    Simvastatin 80 Mg Tabs (Simvastatin) ..... One tab by mouth qhs  Orders: T-Lipid Profile [phone]) T-Hepatic Function [phone])  Labs Reviewed: SGOT: 11 (08/29/2009)   SGPT: 13 (08/29/2009)   HDL:29 (08/29/2009), 30 (04/25/2009)  LDL:92 (08/29/2009), 135 (53/66/4403)  Chol:175 (08/29/2009), 208 (04/25/2009)  Trig:270 (08/29/2009), 217 (04/25/2009)  Problem # 5:  ACUTE CYSTITIS (ICD-595.0) Assessment: Comment Only  Orders: Urology Referral (Urology)  Complete  Medication List: 1)  Plavix 75 Mg Tabs (Clopidogrel bisulfate) .... One tab by mouth once daily 2)  Isosorbide Mononitrate Cr 60 Mg Tb24 (Isosorbide mononitrate) .... One tab by mouth every morning 3)  Albuterol 90 Mcg/act Aers (Albuterol) .... Uad 4)  Trilipix 135 Mg Cpdr (Choline fenofibrate) .... Take 1 tab by mouth at bedtime 5)  Metoprolol Tartrate 100 Mg Tabs (Metoprolol tartrate) .... One tab by mouth bid 6)  Janumet 50-1000 Mg Tabs (Sitagliptin-metformin hcl) .... Take 1 tablet by mouth two times a day 7)  Amlodipine Besylate 10 Mg Tabs (Amlodipine besylate) .... Take 1 tablet by mouth once a day 8)  Simvastatin 80 Mg Tabs (Simvastatin) .... One tab by mouth qhs 9)  Maxzide-25 37.5-25 Mg Tabs (Triamterene-hctz) .... Take 1 tablet by mouth once a day 10)  Benicar 40 Mg Tabs (Olmesartan medoxomil) .... One tab by mouth once daily 11)  Aspirin 325 Mg Tabs (Aspirin) .... One tab by mouth once daily  Other Orders: T-PSA  ([address]-60454) T-Uric Acid (Blood) [phone])  Patient Instructions: 1)  F/U end October 2)  BMP prior to visit, ICD-9: 3)  HbgA1C prior to visit, ICD-9:   july 21 or after 4)  PSA prior to visit, ICD-9: 5)  Hepatic Panel prior to visit, ICD-9: 6)  Lipid Panel prior to visit, ICD-9: 7)  Urine Microalbumin prior to visit, ICD-9:, uric acid level 8)  I will f/u on your abd scan and labs from the Ed and get back yo you ify you need a referral

## 2010-06-18 NOTE — Letter (Signed)
Summary: Out of Work  [facility]  [address]   [place], [place] [address]   Phone: [phone]  Fax: [phone]    January 07, 2010   Employee:  [name]    To Whom It May Concern:   For Medical reasons, please excuse the above named employee from work for the following dates:  Start:   01/07/10  End:   01/14/10  If you need additional information, please feel free to contact our office.         Sincerely,    Syliva Overman, MD

## 2010-06-18 NOTE — Progress Notes (Signed)
Summary: FAX  Phone Note Call from Patient   Summary of Call: PLEASE FAX OVER TO [phone] HIS PSA Initial call taken by: Lind Guest,  December 04, 2009 10:42 AM  Follow-up for Phone Call        faxed as requested Follow-up by: Adella Hare LPN,  December 04, 2009 11:43 AM

## 2010-06-21 NOTE — Letter (Signed)
Summary: THERAOE[employer]IC SHOES  THERAOE[employer]OES   Imported By: Lind Guest 01/16/2010 14:19:41  _____________________________________________________________________  External Attachment:    Type:   Image     Comment:   External Document

## 2010-06-21 NOTE — Progress Notes (Signed)
Summary: [name]  [facility]   Imported By: Lind Guest 12/07/2009 11:21:00  _____________________________________________________________________  External Attachment:    Type:   Image     Comment:   External Document

## 2010-07-18 ENCOUNTER — Encounter: Payer: Self-pay | Admitting: Family Medicine

## 2010-07-18 ENCOUNTER — Ambulatory Visit (INDEPENDENT_AMBULATORY_CARE_PROVIDER_SITE_OTHER): Payer: Self-pay | Admitting: Family Medicine

## 2010-07-18 ENCOUNTER — Other Ambulatory Visit: Payer: Self-pay | Admitting: Family Medicine

## 2010-07-18 ENCOUNTER — Ambulatory Visit (HOSPITAL_COMMUNITY)
Admission: RE | Admit: 2010-07-18 | Discharge: 2010-07-18 | Disposition: A | Discharge status: Home / Self Care | Payer: MEDICARE | Source: Ambulatory Visit | Attending: Family Medicine | Admitting: Family Medicine

## 2010-07-18 DIAGNOSIS — E109 Type 1 diabetes mellitus without complications: Secondary | ICD-10-CM

## 2010-07-18 DIAGNOSIS — E785 Hyperlipidemia, unspecified: Secondary | ICD-10-CM

## 2010-07-18 DIAGNOSIS — I10 Essential (primary) hypertension: Secondary | ICD-10-CM | POA: Insufficient documentation

## 2010-07-18 DIAGNOSIS — L408 Other psoriasis: Secondary | ICD-10-CM | POA: Insufficient documentation

## 2010-07-18 DIAGNOSIS — R0602 Shortness of breath: Secondary | ICD-10-CM

## 2010-07-18 DIAGNOSIS — R0600 Dyspnea, unspecified: Secondary | ICD-10-CM

## 2010-07-18 DIAGNOSIS — E119 Type 2 diabetes mellitus without complications: Secondary | ICD-10-CM | POA: Insufficient documentation

## 2010-07-18 DIAGNOSIS — I2510 Atherosclerotic heart disease of native coronary artery without angina pectoris: Secondary | ICD-10-CM

## 2010-07-18 DIAGNOSIS — M25559 Pain in unspecified hip: Secondary | ICD-10-CM | POA: Insufficient documentation

## 2010-07-18 DIAGNOSIS — Z87891 Personal history of nicotine dependence: Secondary | ICD-10-CM | POA: Insufficient documentation

## 2010-07-18 LAB — CONVERTED CEMR LAB
CO2: 29 meq/L (ref 19–32)
Calcium: 9.6 mg/dL (ref 8.4–10.5)
Chloride: 102 meq/L (ref 96–112)
Creatinine, Ser: 0.99 mg/dL (ref 0.40–1.50)

## 2010-07-19 ENCOUNTER — Encounter: Payer: Self-pay | Admitting: Family Medicine

## 2010-07-22 ENCOUNTER — Other Ambulatory Visit (HOSPITAL_COMMUNITY): Payer: Self-pay | Admitting: Cardiology

## 2010-07-22 ENCOUNTER — Encounter: Payer: Self-pay | Admitting: Family Medicine

## 2010-07-22 DIAGNOSIS — B353 Tinea pedis: Secondary | ICD-10-CM | POA: Insufficient documentation

## 2010-07-23 ENCOUNTER — Encounter: Payer: Self-pay | Admitting: Family Medicine

## 2010-07-23 LAB — LDL CHOLESTEROL, DIRECT: Direct LDL: 96 mg/dL

## 2010-07-23 LAB — HEPATIC FUNCTION PANEL
ALT: 14 U/L (ref 0–53)
AST: 11 U/L (ref 0–37)
Alkaline Phosphatase: 75 U/L (ref 39–117)
Bilirubin, Direct: 0.1 mg/dL (ref 0.0–0.3)
Indirect Bilirubin: 0.2 mg/dL (ref 0.0–0.9)
Total Bilirubin: 0.3 mg/dL (ref 0.3–1.2)
Total Protein: 6.9 g/dL (ref 6.0–8.3)

## 2010-07-23 LAB — CONVERTED CEMR LAB
ALT: 14 units/L (ref 0–53)
AST: 11 units/L (ref 0–37)
Albumin: 4.3 g/dL (ref 3.5–5.2)
Direct LDL: 96 mg/dL
Indirect Bilirubin: 0.2 mg/dL (ref 0.0–0.9)
Total Protein: 6.9 g/dL (ref 6.0–8.3)

## 2010-07-24 ENCOUNTER — Encounter: Payer: Self-pay | Admitting: Family Medicine

## 2010-07-25 LAB — CONVERTED CEMR LAB
HCT: 37.6 % — ABNORMAL LOW (ref 39.0–52.0)
HDL: 24 mg/dL — ABNORMAL LOW (ref >39)
MCHC: 31.9 g/dL (ref 30.0–36.0)
MCV: 82.8 fL (ref 78.0–100.0)
Platelets: 303 10*3/uL (ref 150–400)
RBC: 4.54 M/uL (ref 4.22–5.81)
WBC: 10.7 10*3/uL — ABNORMAL HIGH (ref 4.0–10.5)

## 2010-07-25 NOTE — Letter (Signed)
Summary: Letter  Letter   Imported By: Lind Guest 07/19/2010 16:01:50  _____________________________________________________________________  External Attachment:    Type:   Image     Comment:   External Document

## 2010-07-25 NOTE — Letter (Signed)
Summary: Out of Work  [facility]  [address]   [place], [place] [address]   Phone: [phone]  Fax: [phone]    July 18, 2010   Employee:  MARV ALFREY Tompson    To Whom It May Concern:   For Medical reasons, please excuse the above named employee from work for the following dates:  Start:   07/18/10  End:   08/18/10 or upon cardiology eval and release  If you need additional information, please feel free to contact our office.         Sincerely,    Milus Mallick. Lodema Hong, MD

## 2010-07-30 NOTE — Letter (Signed)
Summary: Letter  Letter   Imported By: Lind Guest 07/23/2010 16:51:25  _____________________________________________________________________  External Attachment:    Type:   Image     Comment:   External Document

## 2010-07-30 NOTE — Letter (Signed)
Summary: D/C MEDICINE  D/C MEDICINE   Imported By: Lind Guest 07/22/2010 14:42:25  _____________________________________________________________________  External Attachment:    Type:   Image     Comment:   External Document

## 2010-07-30 NOTE — Letter (Signed)
Summary: lab add on  lab add on   Imported By: Luann Bullins 07/24/2010 11:11:55  _____________________________________________________________________  External Attachment:    Type:   Image     Comment:   External Document

## 2010-07-30 NOTE — Assessment & Plan Note (Signed)
Summary: office visit   Vital Signs:  Patient profile:   70 year old male Height:      71 inches Weight:      229 pounds BMI:     32.05 O2 Sat:      94 % Pulse rate:   71 / minute Pulse rhythm:   irregular Resp:     16 per minute BP sitting:   160 / 92  (left arm) Cuff size:   large  Vitals Entered By: Everitt Amber LPN (July 17, 452 11:20 AM)  Nutrition Counseling: Patient's BMI is greater than 25 and therefore counseled on weight management options. CC: c/o hurting in both hips x 2 weeks  Pain Assessment Patient in pain? yes     Location: both hips Intensity: 10 Type: aching Onset of pain  2 weeks   CC:  c/o hurting in both hips x 2 weeks .  History of Present Illness: 1 month h/o exrtional dyspnea with minimal activity, using 2 pillows for months, denies chest pain palpitatons or pND. Has establiashed CAD. C/o bilateral hip pain x 2 weeks. States he has to rest on the job, he is aware of "pushing himself"  c/o dryness and rash on feet, and thick scaly rash on knees and elbows, recent  Current Medications (verified): 1)  Plavix 75 Mg  Tabs (Clopidogrel Bisulfate) .... One Tab By Mouth Once Daily 2)  Albuterol 90 Mcg/act  Aers (Albuterol) .... Uad 3)  Trilipix 135 Mg Cpdr (Choline Fenofibrate) .... Take 1 Tab By Mouth At Bedtime 4)  Metoprolol Tartrate 100 Mg Tabs (Metoprolol Tartrate) .... One Tab By Mouth Bid 5)  Janumet 50-1000 Mg Tabs (Sitagliptin-Metformin Hcl) .... Take 1 Tablet By Mouth Two Times A Day 6)  Amlodipine Besylate 10 Mg Tabs (Amlodipine Besylate) .... Take 1 Tablet By Mouth Once A Day 7)  Benicar 40 Mg Tabs (Olmesartan Medoxomil) .... One Tab By Mouth Once Daily 8)  Aspirin 325 Mg Tabs (Aspirin) .... One Tab By Mouth Once Daily 9)  Allopurinol 300 Mg Tabs (Allopurinol) .... Take 1 Tablet By Mouth Once A Day  Allergies (verified): No Known Drug Allergies  Review of Systems      See HPI General:  Complains of fatigue and weakness. Eyes:   Denies discharge and red eye. ENT:  Denies hoarseness, nasal congestion, postnasal drainage, and sinus pressure. CV:  Complains of difficulty breathing while lying down, fatigue, shortness of breath with exertion, and swelling of feet; denies chest pain or discomfort. Resp:  Denies cough and sputum productive. GI:  Denies abdominal pain, constipation, diarrhea, nausea, and vomiting. GU:  Denies dysuria and urinary frequency. MS:  Complains of joint pain and stiffness. Derm:  Complains of changes in color of skin, itching, lesion(s), and rash. Neuro:  Denies headaches, seizures, and sensation of room spinning. Psych:  Denies anxiety and depression. Endo:  Denies cold intolerance, excessive thirst, excessive urination, and heat intolerance. Heme:  Denies abnormal bruising and bleeding. Allergy:  Denies hives or rash and sneezing.  Physical Exam  General:  ill appearing male, in no obvious c/p distress. HEENT: No facial asymmetry,  EOMI, No sinus tenderness, TM's Clear, oropharynx  pink and moist. No jVD, no adenopathy  Chest: Cdecreased air entry with bibasilar crackles CVS: S1, S2, No murmurs, No S3.   Abd: Soft, Nontender.  MS: decreased  ROM spine and  hips,  and knees.  Ext:2 plus pitting edema CNS: CN 2-12 intact, power tone and sensation normal throughout.  Skin:severe tinea pedis bilaterally, thick plaques on knees and elbows, scaling Psych: Good eye contact, normal affect.  Memory intact, not anxious or depressed appearing.   Diabetes Management Exam:    Foot Exam (with socks and/or shoes not present):       Sensory-Monofilament:          Left foot: diminished          Right foot: diminished       Inspection:          Left foot: abnormal             Comments: severe tinea pedis          Right foot: abnormal             Comments: severe tinea pedis       Nails:          Left foot: fungal infection          Right foot: fungal infection   Impression &  Recommendations:  Problem # 1:  HIP PAIN, BILATERAL (ICD-719.45) Assessment Deteriorated  The following medications were removed from the medication list:    Indomethacin 25 Mg Caps (Indomethacin) .Marland Kitchen... 1 tablet 3 times daily for2 days, then twice daily for 2 days then 1 daily His updated medication list for this problem includes:    Aspirin 325 Mg Tabs (Aspirin) ..... One tab by mouth once daily  Orders: Depo- Medrol 80mg  (J1040) Ketorolac-Toradol 15mg  (Z6109) Admin of Therapeutic Inj  intramuscular or subcutaneous (60454)  Problem # 2:  PSORIASIS (ICD-696.1) Assessment: Comment Only  Future Orders: Dermatology Referral (Derma) ... 07/19/2010  Problem # 3:  DYSPNEA (ICD-786.05) Assessment: Deteriorated  Orders: CXR- 2view (CXR) EKG w/ Interpretation (93000) T-BNP  (B Natriuretic Peptide) (09811-91478) T-Basic Metabolic Panel (29562-13086) T-CBC w/Diff (85025-10010)Future Orders: Cardiology Referral (Cardiology) ... 07/19/2010  Problem # 4:  DIABETES MELLITUS, TYPE II (ICD-250.00) Assessment: Deteriorated  His updated medication list for this problem includes:    Janumet 50-1000 Mg Tabs (Sitagliptin-metformin hcl) .Marland Kitchen... Take 1 tablet by mouth two times a day    Benicar 40 Mg Tabs (Olmesartan medoxomil) ..... One tab by mouth once daily    Aspirin 325 Mg Tabs (Aspirin) ..... One tab by mouth once daily    Glipizide 5 Mg Xr24h-tab (Glipizide) .Marland Kitchen... Take 1 tablet by mouth once a day  Labs Reviewed: Creat: 1.34 (01/07/2010)    Reviewed HgBA1c results: 6.5 (12/06/2009)  7.6 (08/29/2009)  Problem # 5:  HYPERTENSION (ICD-401.9) Assessment: Deteriorated  The following medications were removed from the medication list:    Maxzide-25 37.5-25 Mg Tabs (Triamterene-hctz) .Marland Kitchen... Take 1 tablet by mouth once a day His updated medication list for this problem includes:    Metoprolol Tartrate 100 Mg Tabs (Metoprolol tartrate) ..... One tab by mouth bid    Amlodipine Besylate 10 Mg  Tabs (Amlodipine besylate) .Marland Kitchen... Take 1 tablet by mouth once a day    Benicar 40 Mg Tabs (Olmesartan medoxomil) ..... One tab by mouth once daily  Orders: CXR- 2view (CXR) Medicare Electronic Prescription [id number])  BP today: 160/92 Prior BP: 130/62 (01/07/2010)  Labs Reviewed: K+: 4.2 (01/07/2010) Creat: : 1.34 (01/07/2010)   Chol: 212 (12/06/2009)   HDL: 30 (12/06/2009)   LDL: 124 (12/06/2009)   TG: 292 (12/06/2009)  Problem # 6:  DERMATOPHYTOSIS OF FOOT (ICD-110.4) Assessment: Deteriorated  His updated medication list for this problem includes:    Clotrimazole-betamethasone 1-0.05 % Crea (Clotrimazole-betamethasone) .Marland Kitchen... Apply twice daily to rash on feet  Complete Medication List: 1)  Plavix 75 Mg Tabs (Clopidogrel bisulfate) .... One tab by mouth once daily 2)  Albuterol 90 Mcg/act Aers (Albuterol) .... Uad 3)  Trilipix 135 Mg Cpdr (Choline fenofibrate) .... Take 1 tab by mouth at bedtime 4)  Metoprolol Tartrate 100 Mg Tabs (Metoprolol tartrate) .... One tab by mouth bid 5)  Janumet 50-1000 Mg Tabs (Sitagliptin-metformin hcl) .... Take 1 tablet by mouth two times a day 6)  Amlodipine Besylate 10 Mg Tabs (Amlodipine besylate) .... Take 1 tablet by mouth once a day 7)  Benicar 40 Mg Tabs (Olmesartan medoxomil) .... One tab by mouth once daily 8)  Aspirin 325 Mg Tabs (Aspirin) .... One tab by mouth once daily 9)  Allopurinol 300 Mg Tabs (Allopurinol) .... Take 1 tablet by mouth once a day 10)  Pravastatin Sodium 80 Mg Tabs (Pravastatin sodium) .... Take 1 tab by mouth at bedtime stop simvastatin effective 07/18/2010 11)  Isosorbide Mononitrate Cr 60 Mg Xr24h-tab (Isosorbide mononitrate) .... Take 1 tablet by mouth once a day 12)  Clotrimazole-betamethasone 1-0.05 % Crea (Clotrimazole-betamethasone) .... Apply twice daily to rash on feet 13)  Glipizide 5 Mg Xr24h-tab (Glipizide) .... Take 1 tablet by mouth once a day  Other Orders: T-Lipid Profile (04540-98119) T- Hemoglobin A1C  (14782-95621)  Patient Instructions: 1)  Please schedule a follow-up appointment in 1 month. 2)  You need a cxr and labwork today. 3)  You need to be seen by your cardiologist soon, we will schedule. 4)  Work excuse for 1 month, or until cardiology has finished evaluating you. 5)  You will get 2 injections in the office for hip pain. 6)  Youir blood pressure is high, you need to start back on imdur. 7)  You need to take a new med for cholesterol , pravstatin , stop the simvastatin. 8)  Med is ssent for rash on feet. 9)  Youwill be referred to dermatology for the rash on your knees and elbows Prescriptions: GLIPIZIDE 5 MG XR24H-TAB (GLIPIZIDE) Take 1 tablet by mouth once a day  #30 x 3   Entered and Authorized by:   Syliva Overman MD   Signed by:   Syliva Overman MD on 07/22/2010   Method used:   Historical   RxID:   [id number] CLOTRIMAZOLE-BETAMETHASONE 1-0.05 % CREA (CLOTRIMAZOLE-BETAMETHASONE) apply twice daily to rash on feet  #60gm x 1   Entered and Authorized by:   Syliva Overman MD   Signed by:   Syliva Overman MD on 07/18/2010   Method used:   Electronically to        [facility] [phone]* (retail)       [address]       [place], [place]  [address]       Ph: [phone] or [phone]       Fax: [phone]   RxID:   [id number] ISOSORBIDE MONONITRATE CR 60 MG XR24H-TAB (ISOSORBIDE MONONITRATE) Take 1 tablet by mouth once a day  #30 x 1   Entered and Authorized by:   Syliva Overman MD   Signed by:   Syliva Overman MD on 07/18/2010   Method used:   Printed then faxed to ...       [address] [phone]* (retail)       [address]       [place], [place]  [address]       Ph: [phone]  or [phone]       Fax: [phone]   RxID:   [id number] PRAVASTATIN SODIUM 80 MG TABS (PRAVASTATIN SODIUM) Take 1 tab by mouth at bedtime stop simvastatin effective 07/18/2010  #30 x 3   Entered and Authorized by:    Syliva Overman MD   Signed by:   Syliva Overman MD on 07/18/2010   Method used:   Printed then faxed to ...       [address] [phone]* (retail)       [address]       [place], [place]  [address]       Ph: [phone] or [phone]       Fax: [phone]   RxID:   [id number]    Medication Administration  Injection # 1:    Medication: Depo- Medrol 80mg     Diagnosis: HIP PAIN, BILATERAL (ICD-719.45)    Route: IM    Site: RUOQ gluteus    Exp Date: 11/12    Lot #: [id number]    Mfr: [employer]    Patient tolerated injection without complications    Given by: Adella Hare LPN (July 17, 3498 1:43 PM)  Injection # 2:    Medication: Ketorolac-Toradol 15mg     Diagnosis: HIP PAIN, BILATERAL (ICD-719.45)    Route: IM    Site: LUOQ gluteus    Exp Date: 10/18/2011    Lot #: [id number]    Mfr: [email or web address]    Comments: toradol 60mg  given    Patient tolerated injection without complications    Given by: Adella Hare LPN (July 17, 9369 1:44 PM)  Orders Added: 1)  CXR- 2view [CXR] 2)  EKG w/ Interpretation [93000] 3)  Medicare Electronic Prescription [G8553] 4)  Est. Patient Level IV [69678] 5)  T-BNP  (B Natriuretic Peptide) [83880-55185] 6)  T-Basic Metabolic Panel [80048-22910] 7)  T-CBC w/Diff [93810-17510] 8)  T-Lipid Profile [80061-22930] 9)  T- Hemoglobin A1C [83036-23375] 10)  Depo- Medrol 80mg  [J1040] 11)  Ketorolac-Toradol 15mg  [J1885] 12)  Admin of Therapeutic Inj  intramuscular or subcutaneous [96372] 13)  Dermatology Referral [Derma] 14)  Cardiology Referral [Cardiology]     Medication Administration  Injection # 1:    Medication: Depo- Medrol 80mg     Diagnosis: HIP PAIN, BILATERAL (ICD-719.45)    Route: IM    Site: RUOQ gluteus    Exp Date: 11/12    Lot #: [id number]    Mfr: [employer]    Patient tolerated injection without complications    Given by: Adella Hare LPN (July 18, 2583 1:43 PM)  Injection # 2:    Medication:  Ketorolac-Toradol 15mg     Diagnosis: HIP PAIN, BILATERAL (ICD-719.45)    Route: IM    Site: LUOQ gluteus    Exp Date: 10/18/2011    Lot #: [id number]    Mfr: [email or web address]    Comments: toradol 60mg  given    Patient tolerated injection without complications    Given by: Adella Hare LPN (July 18, 3534 1:44 PM)  Orders Added: 1)  CXR- 2view [CXR] 2)  EKG w/ Interpretation [93000] 3)  Medicare Electronic Prescription [G8553] 4)  Est. Patient Level IV [14431] 5)  T-BNP  (B Natriuretic Peptide) [83880-55185] 6)  T-Basic Metabolic Panel [80048-22910] 7)  T-CBC w/Diff [54008-67619] 8)  T-Lipid Profile [80061-22930] 9)  T- Hemoglobin A1C [83036-23375] 10)  Depo- Medrol 80mg  [J1040] 11)  Ketorolac-Toradol 15mg  [J1885] 12)  Admin of Therapeutic Inj  intramuscular or  subcutaneous [11914] 13)  Dermatology Referral [Derma] 14)  Cardiology Referral [Cardiology]

## 2010-08-04 LAB — GLUCOSE, CAPILLARY: Glucose-Capillary: 113 mg/dL — ABNORMAL HIGH (ref 70–99)

## 2010-08-04 LAB — DIFFERENTIAL
Eosinophils Absolute: 0.2 10*3/uL (ref 0.0–0.7)
Eosinophils Relative: 2 % (ref 0–5)
Lymphs Abs: 1.5 10*3/uL (ref 0.7–4.0)

## 2010-08-04 LAB — BASIC METABOLIC PANEL
BUN: 18 mg/dL (ref 6–23)
CO2: 24 mEq/L (ref 19–32)
Chloride: 104 mEq/L (ref 96–112)
Creatinine, Ser: 1.63 mg/dL — ABNORMAL HIGH (ref 0.4–1.5)
GFR calc Af Amer: 51 mL/min — ABNORMAL LOW (ref >60)
Glucose, Bld: 155 mg/dL — ABNORMAL HIGH (ref 70–99)

## 2010-08-04 LAB — URINALYSIS, ROUTINE W REFLEX MICROSCOPIC
Bilirubin Urine: NEGATIVE
Glucose, UA: NEGATIVE mg/dL
Ketones, ur: NEGATIVE mg/dL
Nitrite: POSITIVE — AB
Specific Gravity, Urine: >1.03 — ABNORMAL HIGH (ref 1.005–1.030)

## 2010-08-04 LAB — CBC
MCH: 27.9 pg (ref 26.0–34.0)
MCHC: 33.5 g/dL (ref 30.0–36.0)
MCV: 83.3 fL (ref 78.0–100.0)
Platelets: 250 10*3/uL (ref 150–400)
RBC: 4.3 MIL/uL (ref 4.22–5.81)

## 2010-08-04 LAB — URINE CULTURE: Colony Count: >=100000

## 2010-08-04 LAB — URINE MICROSCOPIC-ADD ON

## 2010-08-05 LAB — GLUCOSE, CAPILLARY: Glucose-Capillary: 101 mg/dL — ABNORMAL HIGH (ref 70–99)

## 2010-08-11 LAB — DIFFERENTIAL
Basophils Absolute: 0.1 10*3/uL (ref 0.0–0.1)
Basophils Relative: 1 % (ref 0–1)
Eosinophils Absolute: 0.4 10*3/uL (ref 0.0–0.7)
Neutrophils Relative %: 65 % (ref 43–77)

## 2010-08-11 LAB — POCT CARDIAC MARKERS: Myoglobin, poc: 101 ng/mL (ref 12–200)

## 2010-08-11 LAB — BASIC METABOLIC PANEL
BUN: 17 mg/dL (ref 6–23)
CO2: 26 mEq/L (ref 19–32)
Chloride: 104 mEq/L (ref 96–112)
Creatinine, Ser: 1.25 mg/dL (ref 0.4–1.5)

## 2010-08-11 LAB — CBC
MCHC: 34.3 g/dL (ref 30.0–36.0)
MCV: 82.2 fL (ref 78.0–100.0)
Platelets: 256 10*3/uL (ref 150–400)
WBC: 11.7 10*3/uL — ABNORMAL HIGH (ref 4.0–10.5)

## 2010-08-13 ENCOUNTER — Ambulatory Visit (HOSPITAL_COMMUNITY)
Admission: RE | Admit: 2010-08-13 | Discharge: 2010-08-13 | Disposition: A | Discharge status: Home / Self Care | Payer: MEDICARE | Source: Ambulatory Visit | Attending: Cardiology | Admitting: Cardiology

## 2010-08-13 ENCOUNTER — Encounter (HOSPITAL_COMMUNITY)
Admission: RE | Admit: 2010-08-13 | Discharge: 2010-08-13 | Disposition: A | Discharge status: Home / Self Care | Payer: MEDICARE | Source: Ambulatory Visit | Attending: Cardiology | Admitting: Cardiology

## 2010-08-13 DIAGNOSIS — R079 Chest pain, unspecified: Secondary | ICD-10-CM | POA: Insufficient documentation

## 2010-08-13 DIAGNOSIS — Z9861 Coronary angioplasty status: Secondary | ICD-10-CM | POA: Insufficient documentation

## 2010-08-13 MED ORDER — TECHNETIUM TC 99M TETROFOSMIN IV KIT
30.0000 | PACK | Freq: Once | INTRAVENOUS | Status: AC | PRN
  Administered 2010-08-13: 30 via INTRAVENOUS

## 2010-08-13 MED ORDER — TECHNETIUM TC 99M TETROFOSMIN IV KIT
10.0000 | PACK | Freq: Once | INTRAVENOUS | Status: AC | PRN
  Administered 2010-08-13: 10 via INTRAVENOUS

## 2010-08-20 ENCOUNTER — Encounter: Payer: Self-pay | Admitting: Family Medicine

## 2010-08-21 ENCOUNTER — Encounter: Payer: Self-pay | Admitting: Family Medicine

## 2010-08-22 ENCOUNTER — Ambulatory Visit (INDEPENDENT_AMBULATORY_CARE_PROVIDER_SITE_OTHER): Payer: MEDICARE | Admitting: Family Medicine

## 2010-08-22 ENCOUNTER — Encounter: Payer: Self-pay | Admitting: Family Medicine

## 2010-08-22 VITALS — BP 134/82 | HR 63 | Resp 16 | Ht 68.0 in | Wt 230.4 lb

## 2010-08-22 DIAGNOSIS — I10 Essential (primary) hypertension: Secondary | ICD-10-CM

## 2010-08-22 DIAGNOSIS — M129 Arthropathy, unspecified: Secondary | ICD-10-CM

## 2010-08-22 DIAGNOSIS — I2510 Atherosclerotic heart disease of native coronary artery without angina pectoris: Secondary | ICD-10-CM

## 2010-08-22 DIAGNOSIS — M1990 Unspecified osteoarthritis, unspecified site: Secondary | ICD-10-CM

## 2010-08-22 DIAGNOSIS — E785 Hyperlipidemia, unspecified: Secondary | ICD-10-CM

## 2010-08-22 DIAGNOSIS — E119 Type 2 diabetes mellitus without complications: Secondary | ICD-10-CM

## 2010-08-22 DIAGNOSIS — Z125 Encounter for screening for malignant neoplasm of prostate: Secondary | ICD-10-CM

## 2010-08-22 MED ORDER — ALLOPURINOL 300 MG PO TABS: 300.0000 mg | ORAL_TABLET | Freq: Every day | ORAL | Status: DC

## 2010-08-22 MED ORDER — CLOPIDOGREL BISULFATE 75 MG PO TABS: 75.0000 mg | ORAL_TABLET | Freq: Every day | ORAL | Status: DC

## 2010-08-22 MED ORDER — AMLODIPINE BESYLATE 10 MG PO TABS: 10.0000 mg | ORAL_TABLET | Freq: Every day | ORAL | Status: DC

## 2010-08-22 MED ORDER — ISOSORBIDE MONONITRATE ER 60 MG PO TB24: 60.0000 mg | ORAL_TABLET | Freq: Every day | ORAL | Status: DC

## 2010-08-22 NOTE — Progress Notes (Signed)
  Subjective:    Patient ID: [name], male    DOB: 06/27/1940, 70 y.o.   MRN: [id number]  HPI Pt is in for f/u of his chronic  medical conditions. He has been to dermatology since his last visit , and is being treated for psoriasis , which is a new diagnosis for him. He states he has  decided not to ever return to work and has a form to be completed for insurance purposes to this effect. He c/o back and hip pain , states he simply is unable to work even on a part time basis anymore because of this He reports testing his blood sugars at least 4 mornings per week, fastings are reportedly between 120 to 140, he is not taking the glipizide prescribed. He reports getting a negative stress test and a good report from his cardiologist since he was last here     Review of Systems  Constitutional: Positive for fatigue.  HENT: Negative for congestion, sneezing, neck pain, postnasal drip and sinus pressure.   Eyes: Negative for discharge and itching.  Respiratory: Negative for cough, shortness of breath and wheezing.   Cardiovascular: Negative for chest pain, palpitations and leg swelling.  Gastrointestinal: Negative for abdominal pain.  Genitourinary: Negative for frequency.  Musculoskeletal: Positive for back pain and joint swelling.  Skin: Positive for rash.  Neurological: Negative for weakness.  Psychiatric/Behavioral: Negative.        Objective:   Physical Exam  [vitalsreviewed. Constitutional: He appears well-developed. No distress.  HENT:  Right Ear: External ear normal.  Left Ear: External ear normal.  Mouth/Throat: No oropharyngeal exudate.  Eyes: EOM are normal. Right eye exhibits no discharge. Left eye exhibits no discharge.  Neck: Neck supple. No tracheal deviation present. No thyromegaly present.  Cardiovascular: Normal rate, regular rhythm and normal heart sounds.   Pulmonary/Chest: No respiratory distress.  Abdominal: He exhibits distension. There is no tenderness.  There is no guarding.  Musculoskeletal:       Decreased ROM thoracolumbar spine with tenderness over the hip  Lymphadenopathy:    He has no cervical adenopathy.  Neurological: He is alert. No cranial nerve deficit. Coordination normal.  Skin: Skin is warm. Rash noted. He is not diaphoretic.          Assessment & Plan:  1.Back and hip pain: deteriorated, causing pt to state permanently unable to work, form completed to that effect. 2. Hypertension : controlled no med changes at this time. 3.CAD : stable 4, hyperlipidemia: Hyperlipidemia:Low fat diet discussed and encouraged. Pt to continue current meds.

## 2010-08-22 NOTE — Patient Instructions (Signed)
F/U end July.  You need HBA1C and chem 7, June 2 or after, non fasting  You need Fasting lipid, hepatic,eGFR, PSA and microalb July 22 or after.  You need to stop drinking regular sodas. I have completed the form requested.

## 2010-08-25 LAB — COMPREHENSIVE METABOLIC PANEL
ALT: 15 U/L (ref 0–53)
AST: 19 U/L (ref 0–37)
CO2: 27 mEq/L (ref 19–32)
Chloride: 107 mEq/L (ref 96–112)
GFR calc Af Amer: >60 mL/min (ref >60)
GFR calc non Af Amer: 56 mL/min — ABNORMAL LOW (ref >60)
Sodium: 140 mEq/L (ref 135–145)
Total Bilirubin: 0.4 mg/dL (ref 0.3–1.2)

## 2010-08-25 LAB — CBC
RBC: 4.14 MIL/uL — ABNORMAL LOW (ref 4.22–5.81)
WBC: 12.6 10*3/uL — ABNORMAL HIGH (ref 4.0–10.5)

## 2010-08-25 LAB — DIFFERENTIAL
Basophils Absolute: 0.1 10*3/uL (ref 0.0–0.1)
Eosinophils Absolute: 0.3 10*3/uL (ref 0.0–0.7)
Eosinophils Relative: 3 % (ref 0–5)
Lymphs Abs: 2.8 10*3/uL (ref 0.7–4.0)
Monocytes Absolute: 0.9 10*3/uL (ref 0.1–1.0)

## 2010-08-25 LAB — GLUCOSE, CAPILLARY
Glucose-Capillary: 133 mg/dL — ABNORMAL HIGH (ref 70–99)
Glucose-Capillary: 70 mg/dL (ref 70–99)

## 2010-08-25 LAB — POCT CARDIAC MARKERS: CKMB, poc: <1 ng/mL — ABNORMAL LOW (ref 1.0–8.0)

## 2010-08-25 LAB — PROTIME-INR: Prothrombin Time: 13.2 seconds (ref 11.6–15.2)

## 2010-09-20 ENCOUNTER — Other Ambulatory Visit: Payer: Self-pay | Admitting: Family Medicine

## 2010-10-03 ENCOUNTER — Telehealth: Payer: Self-pay

## 2010-10-03 NOTE — Telephone Encounter (Signed)
Advised urgent care

## 2010-10-04 NOTE — Cardiovascular Report (Signed)
NAME:  [name], [name]                        ACCOUNT NO.:  [id number]   MEDICAL RECORD NO.:  [id number]                   PATIENT TYPE:  OIB   LOCATION:  6529                                 FACILITY:  [facility]   PHYSICIAN:  Robynn Pane, M.D.               DATE OF BIRTH:  1941/01/21   DATE OF PROCEDURE:  03/01/2002  DATE OF DISCHARGE:                              CARDIAC CATHETERIZATION   PROCEDURE:  1. Left cardiac catheterization with selective left and right coronary     angiography, left ventricular angiography via right groin using Judkins     technique.  2. Successful percutaneous transluminal coronary angioplasty to proximal     left anterior descending artery using 3.5 x 15-mm long CrossSail balloon.  3. Successful deployment of 4.0 x 18-mm long Zeta Multilink stent in     proximal left anterior descending artery.   INDICATIONS FOR PROCEDURE:  The patient is a 70 year old black male with a  past medical history significant for coronary artery disease status post  inferior wall MI, status post PTCA to RCA in 1997, hypertension, tobacco  abuse, complains of retrosternal chest pain off without any associated  symptoms.  Denies any nausea, vomiting, diaphoresis.  Denies rest or  nocturnal angina.  Denies PND, orthopnea, leg swelling.  The patient  underwent stress Cardiolite on February 11, 2002, which showed mild  ischemia in the anteroapical wall with EF of 49%.  EKG done in my office  showed new T wave inversion in anterolateral leads.   PAST MEDICAL HISTORY:  As above.   PAST SURGICAL HISTORY:  None except for PTCA to RCA in May 1997.   SOCIAL HISTORY:  He is married.  Works for [employer].  Smoked one pack per  day for 35+ years, now smokes occasionally.  No history of alcohol abuse or  drug abuse.   MEDICATIONS AT HOME:  Tiazac 240 mg p.o. q.d., Avalide 300/12.5 mg p.o.  q.d., baby aspirin 81 mg p.o. q.d., Imdur 30 mg p.o. q.a.m.   ALLERGIES:  No known drug  allergies except for he has intolerance to beta  blockers due to decreased libido.   FAMILY HISTORY:  Noncontributory.   PHYSICAL EXAMINATION:  GENERAL:  He was alert, awake, oriented x3 in no  acute distress.  VITAL SIGNS:  Blood pressure was 128/78.  Pulse was 68 and regular.  HEENT:  Conjunctivae was pink.  NECK:  Supple, no JVD, no bruits.  LUNGS:  Clear to auscultation without rhonchi or rales.  CARDIOVASCULAR EXAM:  S1, S2 was normal.  There was no S3 gallop.  ABDOMEN:  Soft.  Bowel sounds were present.  Nontender.  EXTREMITIES:  There was no cyanosis, clubbing, or edema.   IMPRESSION:  New onset angina, positive stress Cardiolite, coronary artery  disease, history of inferior wall myocardial infarction, hypertension,  tobacco abuse, hypercholesterolemia controlled by diet.   DESCRIPTION OF PROCEDURE:  Discussed  with patient at length regarding left  catheterization, possible PTCA and stenting, its risks, i.e., death, MI,  stroke, need for emergency CABG, risk of restenosis, local vascular  complications, etc., and consented for PCI.   After obtaining the informed consent, the patient was brought to the  catheterization lab and was placed on the fluoroscopy table.  The right  groin was prepped and draped in the usual fashion.  Xylocaine, 2%, was used  for local anesthesia in the right groin.  With the help of a thin-walled  needle, a #6 [place] arterial and a #6 French venous sheath were placed.  Both the sheaths were aspirated and flushed.  Next, a #6 French left Judkins  catheter was advanced over the wire under fluoroscopic guidance up to the  ascending aorta.  The wire was pulled out.  The catheter was aspirated and  connected to the manifold.  The catheter was further advanced and engaged  into the left coronary ostium.  Multiple views of the left system were  taken.  Next, the catheter was disengaged and was pulled out over the wire  and was replaced with a #6 [place]  right Judkins catheter which was advanced  over the wire under fluoroscopic guidance up to the ascending aorta.  The  wire was pulled out.  The catheter was aspirated and connected to the  manifold.  The catheter was further advanced and engaged into the right  coronary ostium.  Multiple views of the right system were taken.  Next, the  catheter was disengaged and was pulled out over the wire and was replaced  with a #6 French pigtail catheter which was advanced over the wire under  fluoroscopic guidance up to the ascending aorta.  The catheter was further  advanced across the aortic valve into the LV.  LV pressures were recorded.  Next, left ventriculography was done in 30-degree RAO position.  Post  angiographic pressures were recorded from LV and then pullback pressures  were recorded from the aorta.  There was no gradient across the aortic  valve.  Next, the pigtail catheter was pulled out over the wire and sheaths  were aspirated and flushed.   FINDINGS:  1. Left ventricle showed good left ventricular systolic function.  2. The left main was patent.  3. The left anterior descending artery has 99% proximal complex lesion with     ulcerated plaque with TIMI grade 3 distal flow.  Diagonal #1 was very     small which had an 85% ostial stenosis.  Diagonal #2 and #3 are very,     very small which are patent.  4. The left circumflex has 60-70% restenosis with post stenotic dilatation.     Obtuse marginal #1 is very small.  Obtuse marginal #2 has 70% ostial     stenosis with bifurcation into a superior and inferior branch.  5. The right coronary artery has 85-90% mid stenosis and 40% mid and distal     junction stenosis and 50-60% distal stenosis.   INTERVENTIONAL PROCEDURE:  Successful PTCA to proximal LAD was done using  3.5 x 15-mm long CrossSail balloon for predilatation.  One inflation was done going up to 8 atmospheres of pressure and then 4.0 x 18-mm long Zeta  Multilink stent was  deployed at 8 atmospheres of pressure which was fully  expanded going up to 13 atmospheres of pressure.  The lesion was dilated  from 99% with ulcerated plaque to less than 10% residual with excellent TIMI  grade 3 distal flow without evidence of dissection or distal embolization.  The patient received weight-based heparin, ReoPro, and 300 mg of Plavix  during the procedure.  The patient tolerated the procedure well.  There were  no complications.  The patient was transferred to the recovery room in  stable condition.                                                Robynn Pane, M.D.    MNH/MEDQ  D:  03/02/2002  T:  03/02/2002  Job:  [id number]   cc:   Cardiac Catheterization Lab

## 2010-10-04 NOTE — Cardiovascular Report (Signed)
[name], [name]              ACCOUNT NO.:  [id number]   MEDICAL RECORD NO.:  [id number]          PATIENT TYPE:  OIB   LOCATION:  NA                           FACILITY:  [facility]   PHYSICIAN:  Mohan N. Sharyn Lull, M.D. DATE OF BIRTH:  02-28-1941   DATE OF PROCEDURE:  03/17/2006  DATE OF DISCHARGE:                              CARDIAC CATHETERIZATION   PROCEDURE:  Left cardiac cath with selective left and right coronary  angiography, left ventriculography and aortography via right groin using  Judkins technique.   INDICATIONS FOR PROCEDURE:  [name] is a 70 year old black male with a  past medical history significant for coronary artery disease, history of  anteroseptal wall MI in 1997, and then inferior wall MI in 2003, multivessel  coronary artery disease status post PCI to LAD and RCA in the past,  hypertension, non-insulin-dependent diabetes mellitus, history of tobacco  abuse, who complains of exertional dyspnea with minimal exertion associated  with feeling weak and tired.  He states lately he gets short of breath with  minimal activity.  He denies any chest pain, nausea, vomiting, diaphoresis.  He denies PND, orthopnea, leg swelling, although his activity is limited.  He denies any palpitation, light headedness or syncope.  He denies cough,  fever, or chills.   PAST MEDICAL HISTORY:  As above.   PAST SURGICAL HISTORY:  None.   ALLERGIES:  None.   SOCIAL HISTORY:  He is married,.  He smoked one pack per day for 35 years.  No history of alcohol abuse.   FAMILY HISTORY:  Noncontributory.   MEDICATIONS AT HOME:  He is on metoprolol XL 50 mg 1 1/2 tablet daily,  Benazepril 20 mg p.o. b.i.d., Imdur 60 mg p.o. daily, enteric coated aspirin  81 mg p.o. daily, Plavix 75 mg p.o. daily, Crestor 10 mg p.o. daily, Nexium  40 mg p.o. daily, metformin 500 mg two p.o. b.i.d., Nitrostat 0.4 mg  sublingual p.r.n.   PHYSICAL EXAMINATION:  On examination, he is alert and oriented x3  in no  acute distress.  Blood pressure was 150/90, pulse was 78 and regular.  Conjunctivae was pink.  No JVD, no bruit.  Lungs were clear to auscultation  without rhonchi or rales.  Cardiovascular exam shows normal S1 and S2.  There was a soft systolic murmur and S4 gallop.  Abdomen was soft, bowel  sounds present, obese, nontender.  Extremities reveals no clubbing, cyanosis  or edema.   IMPRESSION:  Exertional dyspnea, probably angina equivalent, multivessel  coronary artery disease, history of anteroseptal and inferior wall MI in the  past, hypertension, non-insulin-dependent diabetes mellitus,  hypercholesteremia, history of tobacco abuse, morbid obesity.   I discussed with the patient regarding noninvasive stress testing versus  left cath, its risks and benefits, i.e. death, MI, stroke, need for  emergency CABG, local vascular complications, he has accepted the risks and  consented for left cath.   DESCRIPTION OF PROCEDURE:  After obtaining informed consent, the patient was  brought to the cath lab and was placed on the fluoroscopy table.  His right  groin was prepped and draped  in the usual fashion.  2% Xylocaine was used  for local anesthesia in the right groin.  With the thin wall needle, a 4-  French arterial sheath was placed.  The sheath was aspirated and flushed.  Next, a 4-French left Judkins catheter was advanced over the wire under  fluoroscopic guidance up to the ascending aorta.  The wire was pulled out,  the catheter was aspirated and connected to the manifold.  The catheter was  further advanced and engaged into the left coronary ostium. Next, multiple  views of the left system were taken.  Next, the catheter was disengaged and  was pulled out over the wire and was replaced with a 4-French right 3D  diagnostic catheter which was advanced over the wire under fluoroscopic  guidance up to the ascending aorta.  The wire was pulled out, the catheter  was aspirated and  connected to the manifold.  The catheter was further  advanced and engaged into the right coronary ostium.  Multiple views of the  right system were taken.  Next, the catheter was disengaged and was pulled  out over the wire and was replaced with a 4-French pigtail catheter which  was advanced over the wire under fluoroscopic guidance into the ascending  aorta.  The catheter was further advanced across the aortic valve into the  LV.  LV pressures were recorded.  Next, LV-gram was done in 30 degrees RAO  position.  Post angiographic pressures were recorded from LV and then  pullback pressures were recorded from aorta.  There was no gradient across  the aortic valve.  Next, the pigtail catheter was pulled down to the  abdominal aorta.  Aortogram was done in PA position.  Next, the pigtail  catheter was pulled out over the wire.  The sheaths were aspirated and  flushed.   FINDINGS:  LV showed mild anterolateral wall hypokinesia, EF of 50-55%.   The left main was patent.   The LAD has 25-30% in stent proximal restenosis and 10% mid stenosis.  Diagonal one to diagonal three were very small.  The ramus was very small.   The left circumflex has 15-20% proximal stenosis and 50-60% mid sequential  stenosis with aneurysmal dilatation leading to ostial OM2.  OM1 was very  small.  OM2 has ostial 50-60% stenosis, as above.  OM3 was very small.  The  left circumflex distally tapers down in AV groove and is diffusely diseased.   The RCA has 30-40% proximal stenosis and 50-60% mid stenosis with post  stenotic dilatation. Distally the stent is patent.  PDA is small which is  patent.  PLV branches are small which are patent.   Aortography showed no aortic aneurysm, bilateral renal arteries were patent.   The patient tolerated procedure well.  There are no complications.  The  patient was transferred to recovery room in stable condition.  PLAN:  Maximize antianginal medication and continue with the  rest of the  medications.           ______________________________  Eduardo Osier Sharyn Lull, M.D.     MNH/MEDQ  D:  03/17/2006  T:  03/17/2006  Job:  [id number]

## 2010-10-04 NOTE — Discharge Summary (Addendum)
NAME:  [name], CAZAREZ                        ACCOUNT NO.:  [id number]   MEDICAL RECORD NO.:  [id number]                   PATIENT TYPE:  OIB   LOCATION:  6529                                 FACILITY:  [facility]   PHYSICIAN:  Robynn Pane, M.D.               DATE OF BIRTH:  May 06, 1941   DATE OF ADMISSION:  03/01/2002  DATE OF DISCHARGE:  03/02/2002                                 DISCHARGE SUMMARY   ADMITTING DIAGNOSES:  1. New onset angina.  Positive stress Cardiolite.  2. Coronary artery disease.  3. History of inferior wall myocardial infarction.  4. Status post percutaneous transluminal coronary angioplasty to right     coronary artery in May 1997.  5. Hypertension.  6. Tobacco abuse.  7. Hypercholesterolemia controlled by diet.   DISCHARGE DIAGNOSES:  1. New onset angina.  2. Status post percutaneous transluminal coronary angioplasty and stenting     to proximal left anterior descending.  3. Three vessel coronary artery disease.  4. Hypertension.  5. Hypercholesterolemia controlled by diet.  6. History of tobacco abuse.   DISCHARGE MEDICATIONS:  1. Enteric-coated aspirin 325 mg one tablet daily.  2. Plavix 75 mg one tablet daily with food.  3. Toprol XL 50 mg one tablet daily.  4. Altace 5 mg one capsule daily.  5. Imdur 60 mg one tablet daily in morning.  6. Nitrostat 0.4 mg sublingual use as directed.   ACTIVITY:  The patient has been advised to avoid heavy lifting, pushing, or  pulling for 48 hours and thereafter heavy exertion.   DIET:  Low salt, low cholesterol.   DISCHARGE INSTRUCTIONS:  Post angioplasty and stent instructions have been  given.  Follow up with me in one week.   CONDITION ON DISCHARGE:  Stable.   BRIEF HISTORY AND HOSPITAL COURSE:  The patient is a 70 year old black male  with a past medical history significant for coronary artery disease, history  of inferior wall MI in May 1997.  Subsequently had primary PTCA to RCA,  hypertension,  tobacco abuse.  Complains of retrosternal chest pain off and  on without associated symptoms.  Denies any nausea, vomiting, diaphoresis.  Denies exertional angina.  Denies PND, orthopnea, leg swelling.  The patient  underwent stress Cardiolite on September 26 which showed mild ischemia in  the anteroapical wall with EF of 49%.  EKG done in my office showed new T-  wave inversion in the anterolateral leads as compared to prior EKG.   PAST MEDICAL HISTORY:  As above.   PAST SURGICAL HISTORY:  No past surgeries except for PTCA to RCA in May  1997.   SOCIAL HISTORY:  He is married.  Works for ____ [employer]: [employer] ____.       Smoked one pack per day for 35+ years.  Now smokes occasionally.  No history  of alcohol or drug abuse.   MEDICATIONS:  1. Dyazide 240  mg p.o. q.d.  2. Imdur 30 mg p.o. q.d.  3. Avalide 300/12.5 mg p.o. q.d.  4. Baby aspirin 81 mg p.o. q.d.   ALLERGIES:  He has intolerance to beta blocker due to decreased libido.  Otherwise, no drug allergies.   FAMILY HISTORY:  Noncontributory.   PHYSICAL EXAMINATION:  GENERAL:  He was alert, awake, oriented x3 in no  acute distress.  VITAL SIGNS:  Blood pressure 128/78, pulse __________.  NECK:  Supple.  No JVD.  No bruits.  LUNGS:  Clear to auscultation without rhonchi, rales.  CARDIOVASCULAR:  S1, S2 was normal.  There was no S3, gallop.  ABDOMEN:  Soft, bowel sounds are present, nontender.  EXTREMITIES:  No clubbing, cyanosis, edema.   IMPRESSION:  1. New onset angina.  Positive stress Cardiolite.  2. Coronary artery disease.  3. History of inferior wall myocardial infarction in the past.  4. Hypertension.  5. Tobacco abuse.  6. History of hypercholesterolemia controlled by diet.   BRIEF HOSPITAL COURSE:  The patient was admitted and underwent left cardiac  cath with selective left and right coronary angiography, LV-graphy, and PTCA  and stenting to proximal LAD as per procedure report.  The patient tolerated   procedure well.  There were no complications.  Post procedure CPK was 69.  His hemoglobin and platelet count have been stable.  His kidney function was  stable.  His groin is stable.  There is no evidence of an abdominal bruit.  The patient has been ambulating in the hallway without any problems.  The  patient is off heparin and __________ inhibitors.  The patient will be  discharged home on above medications and will be followed up in my office in  one week.  We will schedule for PCI to RCA in approximately two weeks or  sooner if needed episodes of recurrent angina.                                               Robynn Pane, M.D.    MNH/MEDQ  D:  03/02/2002  T:  03/03/2002  Job:  [id number]

## 2010-10-04 NOTE — Procedures (Signed)
[name], [name]              ACCOUNT NO.:  [id number]   MEDICAL RECORD NO.:  [id number]          PATIENT TYPE:  OUT   LOCATION:  RESP                          FACILITY:  [facility]   PHYSICIAN:  Edward L. Juanetta Gosling, M.D.DATE OF BIRTH:  [birth year]   DATE OF PROCEDURE:  DATE OF DISCHARGE:                              PULMONARY FUNCTION TEST   1.  Spirometry shows a severe ventilatory defect with evidence of airflow      obstruction.  2.  Lung volumes show reduction in total lung capacity, but it is not to the      level of the airflow obstruction, so I suspect that there are two      separate problems, one a restrictive change, and the other obstructive      change.  3.  DLCO is moderately reduced.  4.  Arterial blood gas is normal.  5.  There is significant bronchodilator response.     Edwa   ELH/MEDQ  D:  05/29/2004  T:  05/29/2004  Job:  [id number]

## 2010-10-04 NOTE — Cardiovascular Report (Signed)
NAME:  [name], [name]                        ACCOUNT NO.:  [id number]   MEDICAL RECORD NO.:  [id number]                   PATIENT TYPE:  OIB   LOCATION:  6522                                 FACILITY:  [facility]   PHYSICIAN:  Mohan N. Sharyn Lull, M.D.              DATE OF BIRTH:  30-Apr-1941   DATE OF PROCEDURE:  03/17/2002  DATE OF DISCHARGE:                              CARDIAC CATHETERIZATION   PROCEDURE:  1. Left cardiac catheterization with selective left and right coronary     angiography via right groin using Judkins technique.  2. Successful percutaneous transluminal coronary angioplasty to distal right     coronary artery using 3.0 x 15-mm long CrossSail balloon.  3. Successful percutaneous transluminal coronary angioplasty to proximal     right coronary artery using 3.4 x 15-mm long CrossSail balloon.  4. Successful deployment of 3.5 x 23-mm long CYPHER drug-eluting stent in     distal right coronary artery.  5. Successful deployment of 4.0 x 13-mm long Zeta MultiLink stent in mid     right coronary artery.   INDICATIONS FOR PROCEDURE:  The patient is a 70 year old black male with a  past medical history significant for coronary artery disease status post  inferior wall MI, status post PTCA and stenting to RCA in 1997, status post  recent PTCA and stenting to proximal LAD and was noted to have three-vessel  coronary artery disease, hypertension, tobacco abuse, and is admitted for  elective PCI to RCA plus/minus to left circumflex.  The patient had  recurrent retrosternal chest pain and subsequently underwent stress  Cardiolite which showed related irreversible ischemia in the anteroapical  wall with EF of 49% and subsequently had left catheterization and PTCA and  stenting to proximal LAD on October 14th and was noted to have three-vessel  coronary artery disease.  The patient denies any further episodes of chest  pain although his activities are completely limited.  He denies  any nausea,  vomiting, diaphoresis.  Denies PND, orthopnea, or leg swelling.  The patient  denies palpitations, lightheadedness, or syncope.  The patient is admitted  for elective PCI.   DESCRIPTION OF PROCEDURE:  After obtaining the informed consent, the patient  was brought to the catheterization lab and was placed on the fluoroscopy  table.  The right groin was prepped and draped in the usual fashion.  Xylocaine, 2%, was used for local anesthesia in the right groin.  With the  help of a thin-walled needle, a #7 [place] arterial sheath and a #6 French  venous sheath were placed.  Both of the sheaths were aspirated and flushed.  Next a #6 [place] left Judkins catheter was advanced over the wire under  fluoroscopic guidance up to the ascending aorta.  The wire was pulled out.  The catheter was aspirated and connected to the manifold.  The catheter was  further advanced and engaged into the left coronary ostium.  Multiple views  of the left system were taken.  Next the catheter was disengaged and was  pulled out over the wire and was replaced with a #6 [place] right Judkins  catheter which was advanced over the wire under fluoroscopic guidance up to  the ascending aorta.  The wire was pulled out.  The catheter was aspirated  and connected to the manifold.  The catheter was further advanced and  engaged into the right coronary ostium.  Multiple views of the right system  were taken.  Next the catheter was disengaged and was pulled out over the  wire.  Sheaths were aspirated and flushed.   FINDINGS:  1. LV was not done as the patient had LV done approximately two weeks ago.  2. The left main was patent.  3. The LAD was patent at prior PTCA and stented site.  Diagonal #1 has 60-     70% ostial stenosis which is a very small vessel.  Diagonal #2 and     diagonal #3 are very, very small which are patent.  4. The left circumflex has 50-60% mid and post stenotic dilatation.  OM-1 is     very,  very small.  OM-2 has 40-50% ostial stenosis which appears not to     be critical and stenosis appears less as compared to prior angiogram.  5. RCA has 85-90% mid stenosis and 40% mid and distal junction stenosis and     60-70% distal sequential stenosis with haziness.   INTERVENTIONAL PROCEDURE:  Successful PTCA to distal and mid RCA was done  using 3.0 x 15-mm long CrossSail balloon for predilatation and then 3.5 x 23-  mm long CYPHER drug-eluting stent was deployed at 11 atmospheres of pressure  in the distal RCA which was fully expanded, going up to 13 atmospheres of  pressure.  The lesion was dilated from 60-70% to 0% residual with excellent  TIMI grade 3 distal flow to evidence of dissection or distal embolization  and then 4.0 x 13-mm long Zeta MultiLink stent was deployed in the mid RCA  to 10 atmospheres of pressure which was fully expanded going up to 15  atmospheres of pressure.  The lesion was dilated from 85-90% to 0% residual  with excellent TIMI grade 3 distal flow without evidence of dissection or  distal embolization.  The patient received weight-based heparin, Integrilin  during the procedure and was continued on aspirin and Plavix.  The patient  tolerated the procedure well.  There were no complications.  The patient was  transferred to the recovery room in stable condition.                                               Eduardo Osier. Sharyn Lull, M.D.    [facility]  D:  03/17/2002  T:  03/17/2002  Job:  [id number]   cc:   Cardiac Catheterization Lab

## 2010-10-05 ENCOUNTER — Emergency Department (HOSPITAL_COMMUNITY): Payer: Medicare Other

## 2010-10-05 ENCOUNTER — Emergency Department (HOSPITAL_COMMUNITY)
Admission: EM | Admit: 2010-10-05 | Discharge: 2010-10-05 | Disposition: A | Discharge status: Home / Self Care | Payer: Medicare Other | Attending: Emergency Medicine | Admitting: Emergency Medicine

## 2010-10-05 DIAGNOSIS — E785 Hyperlipidemia, unspecified: Secondary | ICD-10-CM | POA: Insufficient documentation

## 2010-10-05 DIAGNOSIS — J449 Chronic obstructive pulmonary disease, unspecified: Secondary | ICD-10-CM | POA: Insufficient documentation

## 2010-10-05 DIAGNOSIS — J209 Acute bronchitis, unspecified: Secondary | ICD-10-CM | POA: Insufficient documentation

## 2010-10-05 DIAGNOSIS — I10 Essential (primary) hypertension: Secondary | ICD-10-CM | POA: Insufficient documentation

## 2010-10-05 DIAGNOSIS — R05 Cough: Secondary | ICD-10-CM | POA: Insufficient documentation

## 2010-10-05 DIAGNOSIS — E119 Type 2 diabetes mellitus without complications: Secondary | ICD-10-CM | POA: Insufficient documentation

## 2010-10-05 DIAGNOSIS — R059 Cough, unspecified: Secondary | ICD-10-CM | POA: Insufficient documentation

## 2010-10-05 DIAGNOSIS — I252 Old myocardial infarction: Secondary | ICD-10-CM | POA: Insufficient documentation

## 2010-10-05 DIAGNOSIS — Z79899 Other long term (current) drug therapy: Secondary | ICD-10-CM | POA: Insufficient documentation

## 2010-10-05 DIAGNOSIS — J4489 Other specified chronic obstructive pulmonary disease: Secondary | ICD-10-CM | POA: Insufficient documentation

## 2010-10-19 LAB — BASIC METABOLIC PANEL
BUN: 12 mg/dL (ref 6–23)
Chloride: 101 mEq/L (ref 96–112)
Glucose, Bld: 151 mg/dL — ABNORMAL HIGH (ref 70–99)
Potassium: 3.7 mEq/L (ref 3.5–5.3)

## 2010-10-22 MED ORDER — GLIPIZIDE ER 5 MG PO TB24: 5.0000 mg | ORAL_TABLET | Freq: Two times a day (BID) | ORAL | Status: DC

## 2010-10-22 NOTE — Progress Notes (Signed)
Addended by: Adella Hare B on: 10/22/2010 11:50 AM   Modules accepted: Orders

## 2010-11-01 ENCOUNTER — Other Ambulatory Visit: Payer: Self-pay | Admitting: Family Medicine

## 2010-12-03 ENCOUNTER — Telehealth: Payer: Self-pay | Admitting: Family Medicine

## 2010-12-03 NOTE — Telephone Encounter (Signed)
Advised sallie to put him in tomorrow. She states no where to put him. He is upset and is going to call in the am to see if someone cancels. States he is having bad knee pain. I did not speak with him directly

## 2010-12-03 NOTE — Telephone Encounter (Signed)
Has appt in am 

## 2010-12-04 ENCOUNTER — Encounter: Payer: Self-pay | Admitting: Family Medicine

## 2010-12-04 ENCOUNTER — Ambulatory Visit: Payer: Medicare Other | Admitting: Family Medicine

## 2010-12-11 ENCOUNTER — Encounter: Payer: Self-pay | Admitting: Family Medicine

## 2010-12-13 ENCOUNTER — Ambulatory Visit (INDEPENDENT_AMBULATORY_CARE_PROVIDER_SITE_OTHER): Payer: Medicare Other | Admitting: Family Medicine

## 2010-12-13 ENCOUNTER — Encounter: Payer: Self-pay | Admitting: Family Medicine

## 2010-12-13 VITALS — BP 138/80 | HR 89 | Resp 16 | Ht 69.0 in | Wt 230.0 lb

## 2010-12-13 DIAGNOSIS — E119 Type 2 diabetes mellitus without complications: Secondary | ICD-10-CM

## 2010-12-13 DIAGNOSIS — M25569 Pain in unspecified knee: Secondary | ICD-10-CM

## 2010-12-13 DIAGNOSIS — M25562 Pain in left knee: Secondary | ICD-10-CM

## 2010-12-13 DIAGNOSIS — M25572 Pain in left ankle and joints of left foot: Secondary | ICD-10-CM

## 2010-12-13 DIAGNOSIS — M25579 Pain in unspecified ankle and joints of unspecified foot: Secondary | ICD-10-CM

## 2010-12-13 DIAGNOSIS — I10 Essential (primary) hypertension: Secondary | ICD-10-CM

## 2010-12-13 DIAGNOSIS — E785 Hyperlipidemia, unspecified: Secondary | ICD-10-CM

## 2010-12-13 MED ORDER — KETOROLAC TROMETHAMINE 60 MG/2ML IM SOLN
60.0000 mg | Freq: Once | INTRAMUSCULAR | Status: AC
  Administered 2010-12-13: 60 mg via INTRAMUSCULAR

## 2010-12-13 NOTE — Patient Instructions (Addendum)
F/U in mid September.  HBA1C CMP , fasting lipid  JUST before next visit  You will get toradol 60 mg IM   For the left knee pain, also advil one 3 tiimes daily for the next 10 days, pls call by Tuesday if no better, I will refer you to Dr Arloa Koh today

## 2010-12-14 NOTE — Assessment & Plan Note (Signed)
Pt non compliant with med , states cannot afford it, importance of lowering cholesterol was stressed

## 2010-12-14 NOTE — Assessment & Plan Note (Signed)
Sub optimal control, no med change at this time 

## 2010-12-14 NOTE — Assessment & Plan Note (Signed)
Uncontrolled. Medication compliance addressed. Commitment to regular exercise, and healthy  eating habits with portion control discussed. DASH diet, and low fat diet discussed, and literature offered. No changes in medication at this time.  

## 2010-12-14 NOTE — Progress Notes (Signed)
  Subjective:    Patient ID: [name], male    DOB: January 25, 1941, 70 y.o.   MRN: [id number]  HPI 10 day h/o incontroled knee and leg pain following a fall, he also notes limitation in his ability to bend the knee, and has point tenderness on the lateral aspect. The PT is here for follow up and re-evaluation of chronic medical conditions, medication management and review of any available recent lab and radiology data.  Preventive health is updated, specifically  Cancer screening and Immunization.   Questions or concerns regarding consultations or procedures which the PT has had in the interim are  addressed. The PT denies any adverse reactions to current medications since the last visit.  Reports marked improvement in his blood sugars with recent med change , denies hypoglycemic episodes, and states his fasting sugars are seldom over 135      Review of Systems Denies recent fever or chills. Denies sinus pressure, nasal congestion, ear pain or sore throat. Denies chest congestion, productive cough or wheezing. Denies chest pains, palpitations and leg swelling Denies abdominal pain, nausea, vomiting,diarrhea or constipation.   Denies dysuria, frequency, hesitancy or incontinence.  Denies headaches, seizures, numbness, or tingling. Denies depression, anxiety or insomnia. Denies skin break down or rash.        Objective:   Physical Exam Patient alert and oriented and in no cardiopulmonary distress.  HEENT: No facial asymmetry, EOMI, no sinus tenderness,  oropharynx pink and moist.  Neck supple no adenopathy.  Chest: Clear to auscultation bilaterally.  CVS: S1, S2 no murmurs, no S3.  ABD: Soft non tender. Bowel sounds normal.  Ext: No edema  MS: Adequate ROM spine, shoulders, hips and reduced in left  Knee with point tenderness  Skin: Intact, no ulcerations or rash noted.  Psych: Good eye contact, normal affect. Memory intact not anxious or depressed appearing.  CNS:  CN 2-12 intact, power, tone and sensation normal throughout.        Assessment & Plan:   No problem-specific assessment & plan notes found for this encounter.

## 2010-12-14 NOTE — Assessment & Plan Note (Signed)
Deteriorated, acute left knee pain with reduced mobility and point tenderness, aggressive anti-inflammatories, if no improvement , then will refer to orthopedics

## 2011-02-01 LAB — COMPLETE METABOLIC PANEL WITH GFR
ALT: 17 U/L (ref 0–53)
AST: 24 U/L (ref 0–37)
Albumin: 3.8 g/dL (ref 3.5–5.2)
Alkaline Phosphatase: 76 U/L (ref 39–117)
GFR, Est Non African American: >60 mL/min (ref >60)
Glucose, Bld: 227 mg/dL — ABNORMAL HIGH (ref 70–99)
Potassium: 3.7 mEq/L (ref 3.5–5.3)
Sodium: 138 mEq/L (ref 135–145)
Total Protein: 6.6 g/dL (ref 6.0–8.3)

## 2011-02-01 LAB — LIPID PANEL
HDL: 25 mg/dL — ABNORMAL LOW (ref >39)
LDL Cholesterol: 36 mg/dL (ref 0–99)
Total CHOL/HDL Ratio: 5.4 Ratio
VLDL: 73 mg/dL — ABNORMAL HIGH (ref 0–40)

## 2011-02-01 LAB — HEMOGLOBIN A1C
Hgb A1c MFr Bld: 10 % — ABNORMAL HIGH (ref <5.7)
Mean Plasma Glucose: 240 mg/dL — ABNORMAL HIGH (ref <117)

## 2011-02-05 ENCOUNTER — Encounter: Payer: Self-pay | Admitting: Family Medicine

## 2011-02-06 ENCOUNTER — Encounter: Payer: Self-pay | Admitting: Family Medicine

## 2011-02-06 ENCOUNTER — Ambulatory Visit (INDEPENDENT_AMBULATORY_CARE_PROVIDER_SITE_OTHER): Payer: Medicare Other | Admitting: Family Medicine

## 2011-02-06 VITALS — BP 126/72 | HR 75 | Resp 16 | Ht 71.0 in | Wt 227.0 lb

## 2011-02-06 DIAGNOSIS — E109 Type 1 diabetes mellitus without complications: Secondary | ICD-10-CM

## 2011-02-06 DIAGNOSIS — I2510 Atherosclerotic heart disease of native coronary artery without angina pectoris: Secondary | ICD-10-CM

## 2011-02-06 DIAGNOSIS — I10 Essential (primary) hypertension: Secondary | ICD-10-CM

## 2011-02-06 DIAGNOSIS — E785 Hyperlipidemia, unspecified: Secondary | ICD-10-CM

## 2011-02-06 DIAGNOSIS — E119 Type 2 diabetes mellitus without complications: Secondary | ICD-10-CM

## 2011-02-06 MED ORDER — ALBUTEROL 90 MCG/ACT IN AERS: 2.0000 | INHALATION_SPRAY | RESPIRATORY_TRACT | Status: DC

## 2011-02-06 NOTE — Patient Instructions (Signed)
F/u in 6 weeks.  BRING all medications, meter and your diary please   Need to take blood sugar medication as prescribed.  Janumet take TWO tablets every morning at breakfast.  Glucotrol take ONE tablet every morning at breakfast  Check and write down blood sugars every morning. Range you want is 90 to 120 in the morning

## 2011-02-09 NOTE — Assessment & Plan Note (Signed)
Stable at this time, asymptomatic

## 2011-02-09 NOTE — Assessment & Plan Note (Signed)
Uncontrolled due to non compliance with medication, not following directions on his bottles, and not testing. Counselled extensively re the need to address these

## 2011-02-09 NOTE — Progress Notes (Signed)
  Subjective:    Patient ID: [name], male    DOB: 1941/02/22, 70 y.o.   MRN: [id number]  HPI The PT is here for follow up and re-evaluation of chronic medical conditions, medication management and review of any available recent lab and radiology data.  Preventive health is updated, specifically  Cancer screening and Immunization.   Questions or concerns regarding consultations or procedures which the PT has had in the interim are  addressed. The PT denies any adverse reactions to current medications since the last visit.  Unfortunately, Mr Kenneth is under dosing his diabetic medication, for no reason, seems to lack an understanding of basic directions, he has not been testing his sugars , and he is very uncontrolled, worsening. He denies polyuria, polydipsia or blurred vision, he has no hypoglycemic episodes      Review of Systems See HPI Denies recent fever or chills. Denies sinus pressure, nasal congestion, ear pain or sore throat. Denies chest congestion, productive cough or wheezing. Denies chest pains, palpitations and leg swelling Denies abdominal pain, nausea, vomiting,diarrhea or constipation.   Denies dysuria, frequency, hesitancy or incontinence. Chronic back and knee pain , not significantly impacting mobility Denies headaches, seizures, numbness, or tingling. Denies depression, anxiety or insomnia. Denies skin break down or rash.        Objective:   Physical Exam Patient alert and oriented and in no cardiopulmonary distress.  HEENT: No facial asymmetry, EOMI, no sinus tenderness,  oropharynx pink and moist.  Neck supple no adenopathy.  Chest: Clear to auscultation bilaterally.  CVS: S1, S2 no murmurs, no S3.  ABD: Soft non tender. Bowel sounds normal.  Ext: No edema  MS: decreased  ROM spine, shoulders, hips and knees.  Skin: Intact, no flare of psoriasis currently  Psych: Good eye contact, normal affect. Memory impaired mildlynot anxious or  depressed appearing.  CNS: CN 2-12 intact, power, tone and sensation normal throughout.        Assessment & Plan:

## 2011-02-09 NOTE — Assessment & Plan Note (Signed)
Controlled, no change in medication Encouraged pt to commit to regular exercise in the hope that his HDL will improve

## 2011-02-09 NOTE — Assessment & Plan Note (Signed)
Controlled, no change in medication  

## 2011-03-18 ENCOUNTER — Encounter: Payer: Self-pay | Admitting: Family Medicine

## 2011-04-01 ENCOUNTER — Encounter: Payer: Self-pay | Admitting: Family Medicine

## 2011-04-02 ENCOUNTER — Encounter: Payer: Self-pay | Admitting: Family Medicine

## 2011-04-02 ENCOUNTER — Ambulatory Visit: Payer: Medicare Other | Admitting: Family Medicine

## 2011-08-22 ENCOUNTER — Other Ambulatory Visit: Payer: Self-pay | Admitting: Family Medicine

## 2011-09-18 ENCOUNTER — Ambulatory Visit (INDEPENDENT_AMBULATORY_CARE_PROVIDER_SITE_OTHER): Payer: Medicare Other | Admitting: Family Medicine

## 2011-09-18 ENCOUNTER — Other Ambulatory Visit: Payer: Self-pay | Admitting: Family Medicine

## 2011-09-18 ENCOUNTER — Encounter: Payer: Self-pay | Admitting: Family Medicine

## 2011-09-18 VITALS — BP 144/80 | HR 73 | Resp 16 | Ht 69.0 in | Wt 225.0 lb

## 2011-09-18 DIAGNOSIS — L259 Unspecified contact dermatitis, unspecified cause: Secondary | ICD-10-CM

## 2011-09-18 DIAGNOSIS — I2510 Atherosclerotic heart disease of native coronary artery without angina pectoris: Secondary | ICD-10-CM

## 2011-09-18 DIAGNOSIS — I10 Essential (primary) hypertension: Secondary | ICD-10-CM

## 2011-09-18 DIAGNOSIS — L309 Dermatitis, unspecified: Secondary | ICD-10-CM | POA: Insufficient documentation

## 2011-09-18 DIAGNOSIS — E119 Type 2 diabetes mellitus without complications: Secondary | ICD-10-CM

## 2011-09-18 DIAGNOSIS — E785 Hyperlipidemia, unspecified: Secondary | ICD-10-CM

## 2011-09-18 DIAGNOSIS — R5383 Other fatigue: Secondary | ICD-10-CM

## 2011-09-18 DIAGNOSIS — R5381 Other malaise: Secondary | ICD-10-CM

## 2011-09-18 DIAGNOSIS — M25559 Pain in unspecified hip: Secondary | ICD-10-CM

## 2011-09-18 DIAGNOSIS — Z125 Encounter for screening for malignant neoplasm of prostate: Secondary | ICD-10-CM

## 2011-09-18 MED ORDER — LISINOPRIL 40 MG PO TABS: 40.0000 mg | ORAL_TABLET | Freq: Every day | ORAL | Status: DC

## 2011-09-18 MED ORDER — ACCU-CHEK MULTICLIX LANCETS MISC: Status: DC

## 2011-09-18 MED ORDER — CEPHALEXIN 500 MG PO CAPS: 500.0000 mg | ORAL_CAPSULE | Freq: Three times a day (TID) | ORAL | Status: AC

## 2011-09-18 MED ORDER — GLUCOSE BLOOD VI STRP: ORAL_STRIP | Status: DC

## 2011-09-18 NOTE — Patient Instructions (Addendum)
F/u in 3 month  Toradol today for hip pain.    New additional medication for blood pressure, lisinopril 40mg  one daily.  Labs today, you will be contacted re results.  Pneumonia vaccine today.

## 2011-09-18 NOTE — Progress Notes (Signed)
  Subjective:    Patient ID: [name], male    DOB: February 15, 1941, 71 y.o.   MRN: [id number]  HPI Pt in with primary c/o increased bilateral hip pain x 1 month. He has had no direct trauma and has not fallen. Also c/o outbreak of rash on both upper extremities over the past 2 weeks, states this happens periodically. No fever or chills, does get drainage from some of the lesions Pt has not been taking any blood sugar medication and has not been testing his sugars, denies blurred vision, polyuria or polydipsia. I am unable to ascertain if he has been actually taking cholesterol medication   Review of Systems See HPI Denies recent fever or chills. Denies sinus pressure, nasal congestion, ear pain or sore throat. Denies chest congestion, productive cough or wheezing. Denies chest pains, palpitations and leg swelling Denies abdominal pain, nausea, vomiting,diarrhea or constipation.   Denies dysuria, frequency, hesitancy or incontinence.  Denies headaches, seizures, numbness, or tingling. Denies depression, anxiety or insomnia.        Objective:   Physical Exam Patient alert and oriented and in no cardiopulmonary distress.  HEENT: No facial asymmetry, EOMI, no sinus tenderness,  oropharynx pink and moist.  Neck supple no adenopathy.  Chest: Clear to auscultation bilaterally.  CVS: S1, S2 no murmurs, no S3.  ABD: Soft non tender. Bowel sounds normal.  Ext: No edema  MS: Adequate ROM spine, shoulders, and knees.Decreased in hips  Skin: hyperpigmented nodular lesions on both upper extremities  Psych: Good eye contact, normal affect. Memory intact not anxious or depressed appearing.  CNS: CN 2-12 intact, power, tone and sensation normal throughout.        Assessment & Plan:

## 2011-09-19 DIAGNOSIS — M25559 Pain in unspecified hip: Secondary | ICD-10-CM

## 2011-09-19 LAB — CBC WITH DIFFERENTIAL/PLATELET
Basophils Absolute: 0.1 10*3/uL (ref 0.0–0.1)
Eosinophils Relative: 2 % (ref 0–5)
HCT: 39.2 % (ref 39.0–52.0)
Lymphocytes Relative: 34 % (ref 12–46)
Lymphs Abs: 3.7 10*3/uL (ref 0.7–4.0)
MCV: 79.5 fL (ref 78.0–100.0)
Monocytes Absolute: 0.8 10*3/uL (ref 0.1–1.0)
Neutro Abs: 6.1 10*3/uL (ref 1.7–7.7)
Platelets: 300 10*3/uL (ref 150–400)
RBC: 4.93 MIL/uL (ref 4.22–5.81)
RDW: 14.7 % (ref 11.5–15.5)
WBC: 10.8 10*3/uL — ABNORMAL HIGH (ref 4.0–10.5)

## 2011-09-19 LAB — COMPLETE METABOLIC PANEL WITH GFR
AST: 22 U/L (ref 0–37)
Albumin: 4.1 g/dL (ref 3.5–5.2)
Alkaline Phosphatase: 88 U/L (ref 39–117)
BUN: 13 mg/dL (ref 6–23)
Creat: 0.83 mg/dL (ref 0.50–1.35)
GFR, Est Non African American: >89 mL/min
Glucose, Bld: 191 mg/dL — ABNORMAL HIGH (ref 70–99)

## 2011-09-19 LAB — LIPID PANEL
Cholesterol: 180 mg/dL (ref 0–200)
HDL: 25 mg/dL — ABNORMAL LOW (ref >39)
Total CHOL/HDL Ratio: 7.2 Ratio
Triglycerides: 658 mg/dL — ABNORMAL HIGH (ref <150)

## 2011-09-19 LAB — HEMOGLOBIN A1C: Mean Plasma Glucose: 278 mg/dL — ABNORMAL HIGH (ref <117)

## 2011-09-19 MED ORDER — KETOROLAC TROMETHAMINE 60 MG/2ML IM SOLN
60.0000 mg | Freq: Once | INTRAMUSCULAR | Status: AC
  Administered 2011-09-19: 60 mg via INTRAMUSCULAR

## 2011-09-20 NOTE — Assessment & Plan Note (Signed)
Denies any current symptoms.

## 2011-09-20 NOTE — Assessment & Plan Note (Signed)
Uncontrolled, non compliant, needs endo to manage

## 2011-09-20 NOTE — Assessment & Plan Note (Signed)
Uncontrolled, need to ascertain what pt is taking, still have a way to go with educating this pt, demonstrating a lot of non compliance

## 2011-09-20 NOTE — Assessment & Plan Note (Signed)
Uncontrolled and increased x 1 month, toradol in office

## 2011-09-20 NOTE — Assessment & Plan Note (Signed)
Acute flare of infected nodular lesions, antibiotic prescribed

## 2011-09-20 NOTE — Assessment & Plan Note (Signed)
Uncontrolled, pt to resume lisinopril

## 2011-09-22 ENCOUNTER — Other Ambulatory Visit: Payer: Self-pay

## 2011-09-22 MED ORDER — AMLODIPINE BESYLATE 10 MG PO TABS: ORAL_TABLET | ORAL | Status: DC

## 2011-09-22 MED ORDER — METOPROLOL TARTRATE 100 MG PO TABS: ORAL_TABLET | ORAL | Status: DC

## 2011-09-22 MED ORDER — ALLOPURINOL 300 MG PO TABS: ORAL_TABLET | ORAL | Status: DC

## 2011-09-23 LAB — LDL CHOLESTEROL, DIRECT: Direct LDL: 68 mg/dL

## 2011-09-24 ENCOUNTER — Other Ambulatory Visit: Payer: Self-pay | Admitting: Family Medicine

## 2011-09-24 DIAGNOSIS — E119 Type 2 diabetes mellitus without complications: Secondary | ICD-10-CM

## 2011-12-18 ENCOUNTER — Ambulatory Visit: Payer: Medicare Other | Admitting: Family Medicine

## 2011-12-23 ENCOUNTER — Encounter: Payer: Self-pay | Admitting: Family Medicine

## 2011-12-23 ENCOUNTER — Ambulatory Visit (INDEPENDENT_AMBULATORY_CARE_PROVIDER_SITE_OTHER): Payer: Medicare Other | Admitting: Family Medicine

## 2011-12-23 VITALS — BP 170/86 | HR 86 | Resp 18 | Ht 71.0 in | Wt 230.0 lb

## 2011-12-23 DIAGNOSIS — I10 Essential (primary) hypertension: Secondary | ICD-10-CM

## 2011-12-23 DIAGNOSIS — E785 Hyperlipidemia, unspecified: Secondary | ICD-10-CM

## 2011-12-23 DIAGNOSIS — E039 Hypothyroidism, unspecified: Secondary | ICD-10-CM

## 2011-12-23 DIAGNOSIS — E119 Type 2 diabetes mellitus without complications: Secondary | ICD-10-CM

## 2011-12-23 DIAGNOSIS — E559 Vitamin D deficiency, unspecified: Secondary | ICD-10-CM

## 2011-12-23 DIAGNOSIS — Z1211 Encounter for screening for malignant neoplasm of colon: Secondary | ICD-10-CM

## 2011-12-23 DIAGNOSIS — E669 Obesity, unspecified: Secondary | ICD-10-CM

## 2011-12-23 NOTE — Patient Instructions (Addendum)
Annual wellness in November.  You look much better, and I am happy that your blood sugar has improved, continue to follow closely with the diabetes specialist.  Blood pressure is high, pls resume the amlodipine, fill today.   It is important that you exercise regularly at least 30 minutes 5 times a week. If you develop chest pain, have severe difficulty breathing, or feel very tired, stop exercising immediately and seek medical attention    A healthy diet is rich in fruit, vegetables and whole grains. Poultry fish, nuts and beans are a healthy choice for protein rather then red meat. A low sodium diet and drinking 64 ounces of water daily is generally recommended. Oils and sweet should be limited. Carbohydrates especially for those who are diabetic or overweight, should be limited to 30-45 gram per meal. It is important to eat on a regular schedule, at least 3 times daily. Snacks should be primarily fruits, vegetables or nuts.  You are referred for screening colonoscopy.  You need a shingles vaccine, check [facility] or [facility]

## 2011-12-23 NOTE — Assessment & Plan Note (Signed)
Diagnosed 11/2011 by endo Dr Fransico Him and being treated by him

## 2011-12-25 ENCOUNTER — Encounter (INDEPENDENT_AMBULATORY_CARE_PROVIDER_SITE_OTHER): Payer: Self-pay | Admitting: *Deleted

## 2011-12-28 NOTE — Assessment & Plan Note (Signed)
Triglycerides elevated, low fat diet discussed and encouraged 

## 2011-12-28 NOTE — Assessment & Plan Note (Signed)
Deteriorated. Patient re-educated about  the importance of commitment to a  minimum of 150 minutes of exercise per week. The importance of healthy food choices with portion control discussed. Encouraged to start a food diary, count calories and to consider  joining a support group. Sample diet sheets offered. Goals set by the patient for the next several months.    

## 2011-12-28 NOTE — Progress Notes (Signed)
  Subjective:    Patient ID: [name], male    DOB: 07-31-1940, 71 y.o.   MRN: [id number]  HPI The PT is here for follow up and re-evaluation of chronic medical conditions, medication management and review of any available recent lab and radiology data.  Preventive health is updated, specifically  Cancer screening and Immunization.   Questions or concerns regarding consultations or procedures which the PT has had in the interim are  addressed. The PT denies any adverse reactions to current medications since the last visit. Has been out of blood pressure medication for a few days There are no new concerns.  There are no specific complaints       Review of Systems See HPI Denies recent fever or chills. Denies sinus pressure, nasal congestion, ear pain or sore throat. Denies chest congestion, productive cough or wheezing. Denies chest pains, palpitations and leg swelling Denies abdominal pain, nausea, vomiting,diarrhea or constipation.   Denies dysuria, frequency, hesitancy or incontinence. Denies joint pain, swelling and limitation in mobility. Denies headaches, seizures, numbness, or tingling. Denies depression, anxiety or insomnia. Denies skin break down or rash.        Objective:   Physical Exam Patient alert and oriented and in no cardiopulmonary distress.  HEENT: No facial asymmetry, EOMI, no sinus tenderness,  oropharynx pink and moist.  Neck supple no adenopathy.  Chest: Clear to auscultation bilaterally.  CVS: S1, S2 no murmurs, no S3.  ABD: Soft non tender. Bowel sounds normal.  Ext: No edema  MS: Adequate ROM spine, shoulders, hips and knees.  Skin: Intact, no ulcerations or rash noted.  Psych: Good eye contact, normal affect. Memory intact not anxious or depressed appearing.  CNS: CN 2-12 intact, power, tone and sensation normal throughout.        Assessment & Plan:

## 2011-12-28 NOTE — Assessment & Plan Note (Signed)
Improved control now that he is on insulin and being followed by endo

## 2011-12-28 NOTE — Assessment & Plan Note (Signed)
Uncontrolled, has been out of medication for a while

## 2012-01-22 ENCOUNTER — Other Ambulatory Visit (INDEPENDENT_AMBULATORY_CARE_PROVIDER_SITE_OTHER): Payer: Self-pay | Admitting: *Deleted

## 2012-01-22 ENCOUNTER — Telehealth (INDEPENDENT_AMBULATORY_CARE_PROVIDER_SITE_OTHER): Payer: Self-pay | Admitting: *Deleted

## 2012-01-22 ENCOUNTER — Encounter (INDEPENDENT_AMBULATORY_CARE_PROVIDER_SITE_OTHER): Payer: Self-pay | Admitting: *Deleted

## 2012-01-22 DIAGNOSIS — Z1211 Encounter for screening for malignant neoplasm of colon: Secondary | ICD-10-CM

## 2012-01-22 MED ORDER — PEG-KCL-NACL-NASULF-NA ASC-C 100 G PO SOLR: 1.0000 | Freq: Once | ORAL | Status: DC

## 2012-01-22 NOTE — Telephone Encounter (Signed)
Patient needs movi prep 

## 2012-01-29 ENCOUNTER — Other Ambulatory Visit: Payer: Self-pay | Admitting: Family Medicine

## 2012-02-25 ENCOUNTER — Telehealth (INDEPENDENT_AMBULATORY_CARE_PROVIDER_SITE_OTHER): Payer: Self-pay | Admitting: *Deleted

## 2012-02-25 NOTE — Telephone Encounter (Signed)
PCP/Requesting MD: simpson  Name & DOB: [name] Mar 22, 1941   Procedure: tcs  Reason/Indication:  screening  Has patient had this procedure before?  yes  If so, when, by whom and where?  More than 10 yrs ago  Is there a family history of colon cancer?  no  Who?  What age when diagnosed?    Is patient diabetic?   yes      Does patient have prosthetic heart valve?  no  Do you have a pacemaker?  no  Has patient had joint replacement within last 12 months?  no  Is patient on Coumadin, Plavix and/or Aspirin? yes  Medications: asa 325 mg daily, metoprolol 100 mg bid, allopurinol 30 mg daily, levothyroxine 50 mg daily, trilipix 135 mg daily, metformin 500 mg 2 in am & 2 in pm  Allergies: nkda  Medication Adjustment: asa 2 days, hold evening dose of metformin day before, dont take morning of  Procedure date & time: 03/17/12 at 830

## 2012-02-26 NOTE — Telephone Encounter (Signed)
agree

## 2012-03-10 ENCOUNTER — Encounter (HOSPITAL_COMMUNITY): Payer: Self-pay | Admitting: Pharmacy Technician

## 2012-03-17 ENCOUNTER — Encounter (HOSPITAL_COMMUNITY)
Admission: RE | Disposition: A | Discharge status: Home / Self Care | Payer: Self-pay | Source: Ambulatory Visit | Attending: Internal Medicine

## 2012-03-17 ENCOUNTER — Ambulatory Visit (HOSPITAL_COMMUNITY)
Admission: RE | Admit: 2012-03-17 | Discharge: 2012-03-17 | Disposition: A | Discharge status: Home / Self Care | Payer: Medicare Other | Source: Ambulatory Visit | Attending: Internal Medicine | Admitting: Internal Medicine

## 2012-03-17 ENCOUNTER — Encounter (HOSPITAL_COMMUNITY): Payer: Self-pay | Admitting: *Deleted

## 2012-03-17 DIAGNOSIS — D128 Benign neoplasm of rectum: Secondary | ICD-10-CM

## 2012-03-17 DIAGNOSIS — I10 Essential (primary) hypertension: Secondary | ICD-10-CM | POA: Insufficient documentation

## 2012-03-17 DIAGNOSIS — Z01812 Encounter for preprocedural laboratory examination: Secondary | ICD-10-CM | POA: Insufficient documentation

## 2012-03-17 DIAGNOSIS — K644 Residual hemorrhoidal skin tags: Secondary | ICD-10-CM

## 2012-03-17 DIAGNOSIS — D129 Benign neoplasm of anus and anal canal: Secondary | ICD-10-CM

## 2012-03-17 DIAGNOSIS — Z1211 Encounter for screening for malignant neoplasm of colon: Secondary | ICD-10-CM

## 2012-03-17 DIAGNOSIS — E119 Type 2 diabetes mellitus without complications: Secondary | ICD-10-CM | POA: Insufficient documentation

## 2012-03-17 DIAGNOSIS — D126 Benign neoplasm of colon, unspecified: Secondary | ICD-10-CM

## 2012-03-17 HISTORY — DX: Unspecified chronic bronchitis: J42

## 2012-03-17 HISTORY — DX: Gout, unspecified: M10.9

## 2012-03-17 HISTORY — PX: COLONOSCOPY: SHX5424

## 2012-03-17 LAB — GLUCOSE, CAPILLARY: Glucose-Capillary: 227 mg/dL — ABNORMAL HIGH (ref 70–99)

## 2012-03-17 SURGERY — COLONOSCOPY
Anesthesia: Moderate Sedation

## 2012-03-17 MED ORDER — MEPERIDINE HCL 50 MG/ML IJ SOLN
INTRAMUSCULAR | Status: DC | PRN
  Administered 2012-03-17 (×2): 25 mg via INTRAVENOUS

## 2012-03-17 MED ORDER — SODIUM CHLORIDE 0.45 % IV SOLN
INTRAVENOUS | Status: DC
  Administered 2012-03-17: 20 mL/h via INTRAVENOUS

## 2012-03-17 MED ORDER — MEPERIDINE HCL 50 MG/ML IJ SOLN
INTRAMUSCULAR | Status: AC
  Filled 2012-03-17: qty 1

## 2012-03-17 MED ORDER — MIDAZOLAM HCL 5 MG/5ML IJ SOLN
INTRAMUSCULAR | Status: DC | PRN
  Administered 2012-03-17 (×2): 2 mg via INTRAVENOUS

## 2012-03-17 MED ORDER — MIDAZOLAM HCL 5 MG/5ML IJ SOLN
INTRAMUSCULAR | Status: AC
  Filled 2012-03-17: qty 10

## 2012-03-17 MED ORDER — STERILE WATER FOR IRRIGATION IR SOLN
Status: DC | PRN
  Administered 2012-03-17: 08:00:00

## 2012-03-17 NOTE — H&P (Signed)
[name] is an 71 y.o. male.   Chief Complaint: Patient is here for colonoscopy. HPI: Patient is 71 year old African American male who is here for screening colonoscopy. He denies abdominal pain change in his bowel habits or rectal bleeding. He did have exam was 10 years ago and from his description it appears he had flexible sigmoidoscopy. Him history is negative for colorectal carcinoma.  Past Medical History  Diagnosis Date  . Arthritis of knee   . GERD (gastroesophageal reflux disease)   . CAD (coronary artery disease)   . Obesity   . Hyperlipidemia   . Diabetes mellitus, type 1   . Hypertension   . Chronic back pain   . Gout   . Chronic bronchitis     Past Surgical History  Procedure Date  . Appendectomy   . Stent in heart 2009  . Cardiac catheterization     3or 4 stents    Family History  Problem Relation Age of Onset  . Hypertension Mother   . Diabetes Mother   . Hypertension Father   . Diabetes Brother   . Arthritis      Family History   . Diabetes      family History    Social History:  reports that he has quit smoking. He does not have any smokeless tobacco history on file. He reports that he does not drink alcohol or use illicit drugs.  Allergies: No Known Allergies  Medications Prior to Admission  Medication Sig Dispense Refill  . albuterol (PROVENTIL,VENTOLIN) 90 MCG/ACT inhaler Inhale 2 puffs into the lungs as directed.  17 g  3  . allopurinol (ZYLOPRIM) 300 MG tablet TAKE ONE TABLET BY MOUTH EVERY DAY  90 tablet  1  . amLODipine (NORVASC) 10 MG tablet TAKE 1 TABLET BY MOUTH ONCE A DAY  90 tablet  1  . aspirin 325 MG tablet Take 325 mg by mouth daily.        . Choline Fenofibrate (TRILIPIX) 135 MG capsule Take 135 mg by mouth at bedtime.        Marland Kitchen glucose blood (ACCU-CHEK COMPACT STRIPS) test strip Use as instructed once daily  DX 250.00  100 each  12  . Lancets (ACCU-CHEK MULTICLIX) lancets Use as directed once daily  100 each  12  .  levothyroxine (SYNTHROID, LEVOTHROID) 50 MCG tablet Take 50 mcg by mouth daily.      Marland Kitchen lisinopril (PRINIVIL,ZESTRIL) 40 MG tablet Take 1 tablet (40 mg total) by mouth daily.  90 tablet  1  . metFORMIN (GLUCOPHAGE) 500 MG tablet Take 500 mg by mouth 2 (two) times daily with a meal.      . peg 3350 powder (MOVIPREP) 100 G SOLR Take 1 kit (100 g total) by mouth once.  1 kit  0    No results found for this or any previous visit (from the past 48 hour(s)). No results found.  ROS  Blood pressure 164/111, pulse 93, temperature 97.7 F (36.5 C), temperature source Oral, resp. rate 18, height 5\' 11"  (1.803 m), weight 229 lb (103.874 kg), SpO2 95.00%. Physical Exam  Constitutional: He appears well-developed and well-nourished.  HENT:  Mouth/Throat: Oropharynx is clear and moist.  Eyes: Conjunctivae normal are normal. No scleral icterus.  Neck: No thyromegaly present.  Cardiovascular: Normal rate, regular rhythm and normal heart sounds.   No murmur heard. Respiratory: Effort normal and breath sounds normal.  GI: Soft. He exhibits no distension and no mass. There is no tenderness.  Small umbilicus hernia which is completely reducible.  Musculoskeletal: He exhibits no edema.  Lymphadenopathy:    He has no cervical adenopathy.  Neurological: He is alert.  Skin: Skin is warm and dry.     Assessment/Plan Average risk screening colonoscopy.  Aaliyha Mumford U 03/17/2012, 8:15 AM

## 2012-03-17 NOTE — Op Note (Signed)
COLONOSCOPY PROCEDURE REPORT  PATIENT:  [name]  MR#:  [id number] Birthdate:  23-Mar-1941, 71 y.o., male Endoscopist:  Dr. Malissa Hippo, MD Referred By:  Dr. Syliva Overman, MD Procedure Date: 03/17/2012  Procedure:   Colonoscopy with snare polypectomy.  Indications:  Patient is 71 year old African American male who is undergoing screening colonoscopy. This appears to be his first colonoscopy. He has had flexible sigmoidoscopy several years ago.  Informed Consent:  The procedure and risks were reviewed with the patient and informed consent was obtained.  Medications:  Demerol 50 mg IV Versed 4 mg IV  Description of procedure:  After a digital rectal exam was performed, that colonoscope was advanced from the anus through the rectum and colon to the area of the cecum, ileocecal valve and appendiceal orifice. The cecum was deeply intubated. These structures were well-seen and photographed for the record. From the level of the cecum and ileocecal valve, the scope was slowly and cautiously withdrawn. The mucosal surfaces were carefully surveyed utilizing scope tip to flexion to facilitate fold flattening as needed. The scope was pulled down into the rectum where a thorough exam including retroflexion was performed.  Findings:   Prep satisfactory. 10 mm polyp snared from rectosigmoid junction. Small polyp at rectum was coagulated using snare tip. Small hemorrhoids below the dentate line.  Therapeutic/Diagnostic Maneuvers Performed:  See above  Complications:  None  Cecal Withdrawal Time:  8 minutes  Impression:  Examination performed to cecum. 10 mm polyp snared from rectosigmoid junction. Small rectal polyp coagulated using snare tip. Small external hemorrhoids.  Recommendations:  No aspirin for one week. I will contact patient with biopsy results and further recommendations.  Makai Dumond U  03/17/2012 8:53 AM  CC: Dr. Syliva Overman, MD & Dr. Bonnetta Barry ref. provider  found

## 2012-03-18 ENCOUNTER — Encounter (HOSPITAL_COMMUNITY): Payer: Self-pay | Admitting: Internal Medicine

## 2012-03-23 ENCOUNTER — Encounter: Payer: Medicare Other | Admitting: Family Medicine

## 2012-06-09 ENCOUNTER — Encounter: Payer: Self-pay | Admitting: Family Medicine

## 2012-06-09 ENCOUNTER — Ambulatory Visit (INDEPENDENT_AMBULATORY_CARE_PROVIDER_SITE_OTHER): Payer: Medicare Other | Admitting: Family Medicine

## 2012-06-09 VITALS — BP 158/92 | HR 89 | Resp 18 | Ht 71.0 in | Wt 233.0 lb

## 2012-06-09 DIAGNOSIS — L309 Dermatitis, unspecified: Secondary | ICD-10-CM

## 2012-06-09 DIAGNOSIS — Z0000 Encounter for general adult medical examination without abnormal findings: Secondary | ICD-10-CM | POA: Insufficient documentation

## 2012-06-09 DIAGNOSIS — L259 Unspecified contact dermatitis, unspecified cause: Secondary | ICD-10-CM

## 2012-06-09 MED ORDER — CLOTRIMAZOLE-BETAMETHASONE 1-0.05 % EX CREA: TOPICAL_CREAM | Freq: Two times a day (BID) | CUTANEOUS | Status: DC

## 2012-06-09 MED ORDER — CEPHALEXIN 500 MG PO CAPS: 500.0000 mg | ORAL_CAPSULE | Freq: Four times a day (QID) | ORAL | Status: AC

## 2012-06-09 NOTE — Patient Instructions (Addendum)
F/u in 4.5 month, rectal exam at that visit  Please commit to daily exercise to improve your health  You need to read and keep up with the news, sop that you stimulate your brain, and preserve your memory.  Pneumonia vaccine today.    Medication, cream and keflex , sent in for the rash on your arms, if no better, call for referral to dermatology  Blood pressure is high today, take meds at the same time every day , even when coming to the office

## 2012-06-09 NOTE — Assessment & Plan Note (Signed)
Rash on upper extremities, scaly and erythematous, antifungal and steroid cream prescribed, also short antibiotic course

## 2012-06-09 NOTE — Assessment & Plan Note (Signed)
Annual wellness exam completed as documented. Pt has no limitations  In activities of daily living, depression and fall screen are negative, memory is intact. He needs to be more consistent in his approach to healthy lifestyle as physical activity, and low carb diet to improve overall health Advanced directives were discussed, he intends to further discuss this with  His wife

## 2012-06-09 NOTE — Progress Notes (Signed)
Subjective:    Patient ID: [name], male    DOB: May 20, 1940, 72 y.o.   MRN: [id number]  HPI Preventive Screening-Counseling & Management   Patient present here today for a Medicare annual wellness visit.   Current Problems (verified)   Medications Prior to Visit Allergies (verified)   PAST HISTORY  Family History:6 sibling, most have HTn , some DMsibling has intestinal cancer  Social History Married  Father of 2 children, quit nicotine  approx 2004, no alcohol, nicotine or street drugs   Risk Factors  Current exercise habits:  None , needs to commit to daily exercise Dietary issues discussed:   Cardiac risk factors: Established CAd  Depression Screen  (Note: if answer to either of the following is "Yes", a more complete depression screening is indicated)   Over the past two weeks, have you felt down, depressed or hopeless? No  Over the past two weeks, have you felt little interest or pleasure in doing things? No  Have you lost interest or pleasure in daily life? No  Do you often feel hopeless? No  Do you cry easily over simple problems? No   Activities of Daily Living  In your present state of health, do you have any difficulty performing the following activities?  Driving?: No Managing money?: No Feeding yourself?:No Getting from bed to chair?:No Climbing a flight of stairs?:No Preparing food and eating?:No Bathing or showering?:No Getting dressed?:No Getting to the toilet?:No Using the toilet?:No Moving around from place to place?: No  Fall Risk Assessment In the past year have you fallen or had a near fall?:No Are you currently taking any medications that make you dizziness?:No   Hearing Difficulties: No Do you often ask people to speak up or repeat themselves?:No Do you experience ringing or noises in your ears?:No Do you have difficulty understanding soft or whispered voices?:No  Cognitive Testing  Alert? Yes Normal Appearance?Yes  Oriented  to person? Yes Place? Yes  Time? Yes  Displays appropriate judgment?Yes  Can read the correct time from a watch face? yes Are you having problems remembering things?No MMSE 28/30 Advanced Directives have been discussed with the patient?Yes , full code   List the Names of Other Physician/Practitioners you currently use: Dr. Fransico Him, . Dr Margo Aye, Dr Marni Griffon   I Assessment:    Annual Wellness Exam   Plan:    During the course of the visit the patient was educated and counseled about appropriate screening and preventive services including:  A healthy diet is rich in fruit, vegetables and whole grains. Poultry fish, nuts and beans are a healthy choice for protein rather then red meat. A low sodium diet and drinking 64 ounces of water daily is generally recommended. Oils and sweet should be limited. Carbohydrates especially for those who are diabetic or overweight, should be limited to 30-45 gram per meal. It is important to eat on a regular schedule, at least 3 times daily. Snacks should be primarily fruits, vegetables or nuts. It is important that you exercise regularly at least 30 minutes 5 times a week. If you develop chest pain, have severe difficulty breathing, or feel very tired, stop exercising immediately and seek medical attention  Immunization reviewed and updated. Cancer screening reviewed and updated    Patient Instructions (the written plan) was given to the patient.  Medicare Attestation  I have personally reviewed:  The patient's medical and social history  Their use of alcohol, tobacco or illicit drugs  Their current medications and supplements  The patient's functional ability including ADLs,fall risks, home safety risks, cognitive, and hearing and visual impairment  Diet and physical activities  Evidence for depression or mood disorders  The patient's weight, height, BMI, and visual acuity have been recorded in the chart. I have made referrals, counseling, and provided  education to the patient based on review of the above and I have provided the patient with a written personalized care plan for preventive services.      Review of Systems     Objective:   Physical Exam        Assessment & Plan:

## 2012-06-11 ENCOUNTER — Other Ambulatory Visit: Payer: Self-pay

## 2012-06-11 DIAGNOSIS — I10 Essential (primary) hypertension: Secondary | ICD-10-CM

## 2012-06-11 MED ORDER — ALLOPURINOL 300 MG PO TABS: ORAL_TABLET | ORAL | Status: DC

## 2012-06-11 MED ORDER — METOPROLOL TARTRATE 100 MG PO TABS: 100.0000 mg | ORAL_TABLET | Freq: Two times a day (BID) | ORAL | Status: DC

## 2012-06-11 MED ORDER — METFORMIN HCL 500 MG PO TABS: 500.0000 mg | ORAL_TABLET | Freq: Two times a day (BID) | ORAL | Status: DC

## 2012-06-11 MED ORDER — LISINOPRIL 40 MG PO TABS: 40.0000 mg | ORAL_TABLET | Freq: Every day | ORAL | Status: DC

## 2012-07-27 ENCOUNTER — Other Ambulatory Visit: Payer: Self-pay | Admitting: Family Medicine

## 2012-08-13 ENCOUNTER — Encounter (HOSPITAL_COMMUNITY): Payer: Self-pay | Admitting: General Practice

## 2012-08-13 ENCOUNTER — Inpatient Hospital Stay (HOSPITAL_COMMUNITY)
Admission: EM | Admit: 2012-08-13 | Discharge: 2012-08-15 | DRG: 872 | Disposition: A | Discharge status: Home / Self Care | Payer: Medicare Other | Attending: Internal Medicine | Admitting: Internal Medicine

## 2012-08-13 ENCOUNTER — Emergency Department (HOSPITAL_COMMUNITY): Payer: Medicare Other

## 2012-08-13 DIAGNOSIS — M1710 Unilateral primary osteoarthritis, unspecified knee: Secondary | ICD-10-CM

## 2012-08-13 DIAGNOSIS — J4489 Other specified chronic obstructive pulmonary disease: Secondary | ICD-10-CM | POA: Diagnosis present

## 2012-08-13 DIAGNOSIS — E119 Type 2 diabetes mellitus without complications: Secondary | ICD-10-CM

## 2012-08-13 DIAGNOSIS — IMO0001 Reserved for inherently not codable concepts without codable children: Secondary | ICD-10-CM | POA: Diagnosis present

## 2012-08-13 DIAGNOSIS — L309 Dermatitis, unspecified: Secondary | ICD-10-CM

## 2012-08-13 DIAGNOSIS — E1142 Type 2 diabetes mellitus with diabetic polyneuropathy: Secondary | ICD-10-CM | POA: Diagnosis present

## 2012-08-13 DIAGNOSIS — M79609 Pain in unspecified limb: Secondary | ICD-10-CM

## 2012-08-13 DIAGNOSIS — Z0000 Encounter for general adult medical examination without abnormal findings: Secondary | ICD-10-CM

## 2012-08-13 DIAGNOSIS — J42 Unspecified chronic bronchitis: Secondary | ICD-10-CM

## 2012-08-13 DIAGNOSIS — E039 Hypothyroidism, unspecified: Secondary | ICD-10-CM

## 2012-08-13 DIAGNOSIS — E669 Obesity, unspecified: Secondary | ICD-10-CM

## 2012-08-13 DIAGNOSIS — M549 Dorsalgia, unspecified: Secondary | ICD-10-CM | POA: Diagnosis present

## 2012-08-13 DIAGNOSIS — R0602 Shortness of breath: Secondary | ICD-10-CM

## 2012-08-13 DIAGNOSIS — R000 Tachycardia, unspecified: Secondary | ICD-10-CM

## 2012-08-13 DIAGNOSIS — J449 Chronic obstructive pulmonary disease, unspecified: Secondary | ICD-10-CM | POA: Diagnosis present

## 2012-08-13 DIAGNOSIS — I10 Essential (primary) hypertension: Secondary | ICD-10-CM

## 2012-08-13 DIAGNOSIS — Z79899 Other long term (current) drug therapy: Secondary | ICD-10-CM

## 2012-08-13 DIAGNOSIS — B353 Tinea pedis: Secondary | ICD-10-CM

## 2012-08-13 DIAGNOSIS — Z8249 Family history of ischemic heart disease and other diseases of the circulatory system: Secondary | ICD-10-CM

## 2012-08-13 DIAGNOSIS — Z833 Family history of diabetes mellitus: Secondary | ICD-10-CM

## 2012-08-13 DIAGNOSIS — M109 Gout, unspecified: Secondary | ICD-10-CM

## 2012-08-13 DIAGNOSIS — A419 Sepsis, unspecified organism: Principal | ICD-10-CM

## 2012-08-13 DIAGNOSIS — I2510 Atherosclerotic heart disease of native coronary artery without angina pectoris: Secondary | ICD-10-CM

## 2012-08-13 DIAGNOSIS — E559 Vitamin D deficiency, unspecified: Secondary | ICD-10-CM

## 2012-08-13 DIAGNOSIS — N390 Urinary tract infection, site not specified: Secondary | ICD-10-CM

## 2012-08-13 DIAGNOSIS — K219 Gastro-esophageal reflux disease without esophagitis: Secondary | ICD-10-CM

## 2012-08-13 DIAGNOSIS — Z8261 Family history of arthritis: Secondary | ICD-10-CM

## 2012-08-13 DIAGNOSIS — G8929 Other chronic pain: Secondary | ICD-10-CM | POA: Diagnosis present

## 2012-08-13 DIAGNOSIS — E785 Hyperlipidemia, unspecified: Secondary | ICD-10-CM

## 2012-08-13 DIAGNOSIS — L408 Other psoriasis: Secondary | ICD-10-CM

## 2012-08-13 DIAGNOSIS — R739 Hyperglycemia, unspecified: Secondary | ICD-10-CM

## 2012-08-13 LAB — COMPREHENSIVE METABOLIC PANEL
Albumin: 3.2 g/dL — ABNORMAL LOW (ref 3.5–5.2)
Alkaline Phosphatase: 93 U/L (ref 39–117)
BUN: 16 mg/dL (ref 6–23)
CO2: 26 mEq/L (ref 19–32)
Chloride: 93 mEq/L — ABNORMAL LOW (ref 96–112)
Creatinine, Ser: 1.07 mg/dL (ref 0.50–1.35)
GFR calc Af Amer: 79 mL/min — ABNORMAL LOW (ref >90)
GFR calc non Af Amer: 68 mL/min — ABNORMAL LOW (ref >90)
Glucose, Bld: 370 mg/dL — ABNORMAL HIGH (ref 70–99)
Potassium: 3.5 mEq/L (ref 3.5–5.1)
Total Bilirubin: 0.4 mg/dL (ref 0.3–1.2)

## 2012-08-13 LAB — CBC WITH DIFFERENTIAL/PLATELET
Basophils Absolute: 0.1 10*3/uL (ref 0.0–0.1)
Basophils Relative: 1 % (ref 0–1)
Eosinophils Relative: 0 % (ref 0–5)
HCT: 37.9 % — ABNORMAL LOW (ref 39.0–52.0)
MCHC: 33 g/dL (ref 30.0–36.0)
MCV: 80.1 fL (ref 78.0–100.0)
Monocytes Absolute: 1.2 10*3/uL — ABNORMAL HIGH (ref 0.1–1.0)
RDW: 14.4 % (ref 11.5–15.5)

## 2012-08-13 LAB — URINALYSIS, ROUTINE W REFLEX MICROSCOPIC
Bilirubin Urine: NEGATIVE
Ketones, ur: NEGATIVE mg/dL
Nitrite: NEGATIVE
Protein, ur: 30 mg/dL — AB
pH: 5.5 (ref 5.0–8.0)

## 2012-08-13 LAB — LACTIC ACID, PLASMA: Lactic Acid, Venous: 2.8 mmol/L — ABNORMAL HIGH (ref 0.5–2.2)

## 2012-08-13 LAB — GLUCOSE, CAPILLARY: Glucose-Capillary: 278 mg/dL — ABNORMAL HIGH (ref 70–99)

## 2012-08-13 MED ORDER — METFORMIN HCL 500 MG PO TABS: 500.0000 mg | ORAL_TABLET | Freq: Two times a day (BID) | ORAL | Status: DC

## 2012-08-13 MED ORDER — LEVOTHYROXINE SODIUM 75 MCG PO TABS
75.0000 ug | ORAL_TABLET | Freq: Every day | ORAL | Status: DC
  Administered 2012-08-14 – 2012-08-15 (×2): 75 ug via ORAL
  Filled 2012-08-13 (×2): qty 1

## 2012-08-13 MED ORDER — LISINOPRIL 10 MG PO TABS: 40.0000 mg | ORAL_TABLET | Freq: Every day | ORAL | Status: DC

## 2012-08-13 MED ORDER — LISINOPRIL 10 MG PO TABS
40.0000 mg | ORAL_TABLET | Freq: Every day | ORAL | Status: DC
  Administered 2012-08-13 – 2012-08-15 (×3): 40 mg via ORAL
  Filled 2012-08-13 (×3): qty 4

## 2012-08-13 MED ORDER — ALBUTEROL SULFATE HFA 108 (90 BASE) MCG/ACT IN AERS: 2.0000 | INHALATION_SPRAY | Freq: Four times a day (QID) | RESPIRATORY_TRACT | Status: DC | PRN

## 2012-08-13 MED ORDER — SODIUM CHLORIDE 0.9 % IV BOLUS (SEPSIS): 500.0000 mL | Freq: Once | INTRAVENOUS | Status: DC

## 2012-08-13 MED ORDER — SODIUM CHLORIDE 0.9 % IV SOLN: INTRAVENOUS | Status: DC

## 2012-08-13 MED ORDER — ALLOPURINOL 300 MG PO TABS
300.0000 mg | ORAL_TABLET | Freq: Every day | ORAL | Status: DC
  Administered 2012-08-13 – 2012-08-15 (×3): 300 mg via ORAL
  Filled 2012-08-13 (×3): qty 1

## 2012-08-13 MED ORDER — IPRATROPIUM BROMIDE 0.02 % IN SOLN
0.5000 mg | Freq: Once | RESPIRATORY_TRACT | Status: AC
  Administered 2012-08-13: 0.5 mg via RESPIRATORY_TRACT
  Filled 2012-08-13 (×2): qty 2.5

## 2012-08-13 MED ORDER — METFORMIN HCL 500 MG PO TABS: 1000.0000 mg | ORAL_TABLET | Freq: Two times a day (BID) | ORAL | Status: DC

## 2012-08-13 MED ORDER — INSULIN ASPART 100 UNIT/ML ~~LOC~~ SOLN
0.0000 [IU] | Freq: Every day | SUBCUTANEOUS | Status: DC
  Administered 2012-08-13: 3 [IU] via SUBCUTANEOUS

## 2012-08-13 MED ORDER — ACETAMINOPHEN 325 MG PO TABS
650.0000 mg | ORAL_TABLET | Freq: Once | ORAL | Status: AC
  Administered 2012-08-13: 650 mg via ORAL
  Filled 2012-08-13: qty 2

## 2012-08-13 MED ORDER — SODIUM CHLORIDE 0.9 % IV BOLUS (SEPSIS)
1000.0000 mL | Freq: Once | INTRAVENOUS | Status: AC
  Administered 2012-08-13: 1000 mL via INTRAVENOUS

## 2012-08-13 MED ORDER — INSULIN ASPART 100 UNIT/ML ~~LOC~~ SOLN
0.0000 [IU] | Freq: Three times a day (TID) | SUBCUTANEOUS | Status: DC
  Administered 2012-08-14: 3 [IU] via SUBCUTANEOUS
  Administered 2012-08-14 (×2): 7 [IU] via SUBCUTANEOUS
  Administered 2012-08-15: 4 [IU] via SUBCUTANEOUS

## 2012-08-13 MED ORDER — ONDANSETRON HCL 4 MG PO TABS: 4.0000 mg | ORAL_TABLET | Freq: Four times a day (QID) | ORAL | Status: DC | PRN

## 2012-08-13 MED ORDER — SODIUM CHLORIDE 0.9 % IV SOLN
INTRAVENOUS | Status: DC
  Administered 2012-08-14 (×3): via INTRAVENOUS

## 2012-08-13 MED ORDER — METFORMIN HCL 500 MG PO TABS
1000.0000 mg | ORAL_TABLET | Freq: Two times a day (BID) | ORAL | Status: DC
  Administered 2012-08-14 – 2012-08-15 (×3): 1000 mg via ORAL
  Filled 2012-08-13 (×3): qty 2

## 2012-08-13 MED ORDER — METOPROLOL TARTRATE 50 MG PO TABS
100.0000 mg | ORAL_TABLET | Freq: Two times a day (BID) | ORAL | Status: DC
  Administered 2012-08-13 – 2012-08-15 (×4): 100 mg via ORAL
  Filled 2012-08-13: qty 2
  Filled 2012-08-13: qty 1
  Filled 2012-08-13: qty 2
  Filled 2012-08-13: qty 1
  Filled 2012-08-13: qty 2

## 2012-08-13 MED ORDER — ONDANSETRON HCL 4 MG/2ML IJ SOLN: 4.0000 mg | Freq: Four times a day (QID) | INTRAMUSCULAR | Status: DC | PRN

## 2012-08-13 MED ORDER — HEPARIN SODIUM (PORCINE) 5000 UNIT/ML IJ SOLN
5000.0000 [IU] | Freq: Three times a day (TID) | INTRAMUSCULAR | Status: DC
  Administered 2012-08-13 – 2012-08-15 (×5): 5000 [IU] via SUBCUTANEOUS
  Filled 2012-08-13 (×5): qty 1

## 2012-08-13 MED ORDER — ALBUTEROL SULFATE (5 MG/ML) 0.5% IN NEBU
5.0000 mg | INHALATION_SOLUTION | Freq: Once | RESPIRATORY_TRACT | Status: AC
  Administered 2012-08-13: 5 mg via RESPIRATORY_TRACT
  Filled 2012-08-13 (×2): qty 1

## 2012-08-13 MED ORDER — DEXTROSE 5 % IV SOLN
1.0000 g | INTRAVENOUS | Status: DC
  Administered 2012-08-13 – 2012-08-14 (×2): 1 g via INTRAVENOUS
  Filled 2012-08-13 (×3): qty 10

## 2012-08-13 NOTE — ED Provider Notes (Addendum)
History     CSN: [id number]  Arrival date & time 08/13/12  1519   First MD Initiated Contact with Patient 08/13/12 1612      Chief Complaint  Patient presents with  . Chills    (Consider location/radiation/quality/duration/timing/severity/associated sxs/prior treatment) The history is provided by the patient.  pt with hx cad/stents c/o fever, chills, body aches, non productive cough for past 1-2 days. Had mild diarrhea, few stools, yesterday, no diarrhea today. No nv. No abd pain. Denies dysuria or unusual odor/cloudiness to urine. +urinary frequency. No hx uti. Denies sore throat. Mild nasal congestion, no sinus pain or headache. No neck pain or stiffness. No rash or extremity pain/redness/swelling. No known ill contacts. Mild sob, but states always sob w hx copd/chronic bronchitis. States uses inhaler prn. Non smoker. Denies any recent medication change or new meds. States is compliant w his normal meds.      Past Medical History  Diagnosis Date  . Arthritis of knee   . GERD (gastroesophageal reflux disease)   . CAD (coronary artery disease)   . Obesity   . Hyperlipidemia   . Diabetes mellitus, type 1   . Hypertension   . Chronic back pain   . Gout   . Chronic bronchitis   . Psoriasis     Past Surgical History  Procedure Laterality Date  . Appendectomy    . Stent in heart  2009  . Cardiac catheterization      3or 4 stents  . Colonoscopy  03/17/2012    Procedure: COLONOSCOPY;  Surgeon: Malissa Hippo, MD;  Location: [facility];  Service: Endoscopy;  Laterality: N/A;  830    Family History  Problem Relation Age of Onset  . Hypertension Mother   . Diabetes Mother   . Hypertension Father   . Diabetes Brother   . Arthritis      Family History   . Diabetes      family History     History  Substance Use Topics  . Smoking status: Former Games developer  . Smokeless tobacco: Not on file  . Alcohol Use: No      Review of Systems  Constitutional: Positive for  fever and chills.  HENT: Negative for sore throat, neck pain and neck stiffness.   Eyes: Negative for redness.  Respiratory: Positive for cough. Negative for shortness of breath.   Cardiovascular: Negative for chest pain and leg swelling.  Gastrointestinal: Negative for vomiting and abdominal pain.  Genitourinary: Negative for dysuria and flank pain.  Musculoskeletal: Negative for back pain.  Skin: Negative for rash.  Neurological: Negative for headaches.  Hematological: Does not bruise/bleed easily.  Psychiatric/Behavioral: Negative for confusion.    Allergies  Review of patient's allergies indicates no known allergies.  Home Medications   Current Outpatient Rx  Name  Route  Sig  Dispense  Refill  . albuterol (PROAIR HFA) 108 (90 BASE) MCG/ACT inhaler   Inhalation   Inhale 2 puffs into the lungs every 6 (six) hours as needed for wheezing or shortness of breath.         . levothyroxine (SYNTHROID, LEVOTHROID) 75 MCG tablet   Oral   Take 75 mcg by mouth daily.         Marland Kitchen allopurinol (ZYLOPRIM) 300 MG tablet   Oral   Take 300 mg by mouth daily.         Marland Kitchen lisinopril (PRINIVIL,ZESTRIL) 40 MG tablet   Oral   Take 1 tablet (40 mg total) by  mouth daily.   90 tablet   1   . metFORMIN (GLUCOPHAGE) 500 MG tablet   Oral   Take 1 tablet (500 mg total) by mouth 2 (two) times daily with a meal.   60 tablet   3   . metoprolol (LOPRESSOR) 100 MG tablet   Oral   Take 1 tablet (100 mg total) by mouth 2 (two) times daily.   60 tablet   3     BP 164/94  Pulse 162  Temp(Src) 101.4 F (38.6 C) (Oral)  Resp 36  Ht 5\' 11"  (1.803 m)  Wt 235 lb (106.595 kg)  BMI 32.79 kg/m2  SpO2 93%  Physical Exam  Nursing note and vitals reviewed. Constitutional: He is oriented to person, place, and time. He appears well-developed and well-nourished. No distress.  HENT:  Head: Atraumatic.  Nose: Nose normal.  Mouth/Throat: Oropharynx is clear and moist.  Eyes: Conjunctivae are  normal. Pupils are equal, round, and reactive to light. No scleral icterus.  Neck: Normal range of motion. Neck supple. No tracheal deviation present. No thyromegaly present.  No stiffness or rigidity  Cardiovascular: Regular rhythm, normal heart sounds and intact distal pulses.  Exam reveals no gallop and no friction rub.   No murmur heard. tachycardic  Pulmonary/Chest: Effort normal. No accessory muscle usage. No respiratory distress.  Diminished bs bil. Non prod cough, upper resp congestion.   Abdominal: Soft. Bowel sounds are normal. He exhibits no distension and no mass. There is no tenderness. There is no rebound and no guarding.  Genitourinary:  No cva tenderness. No scrotal or testicular pain, swelling, or tenderness.   Musculoskeletal: Normal range of motion. He exhibits no edema and no tenderness.  Neurological: He is alert and oriented to person, place, and time.  Skin: Skin is warm and dry. No rash noted.  Psychiatric: He has a normal mood and affect.    ED Course  Procedures (including critical care time)   Results for orders placed during the hospital encounter of 08/13/12  COMPREHENSIVE METABOLIC PANEL      Result Value Range   Sodium 131 (*) 135 - 145 mEq/L   Potassium 3.5  3.5 - 5.1 mEq/L   Chloride 93 (*) 96 - 112 mEq/L   CO2 26  19 - 32 mEq/L   Glucose, Bld 370 (*) 70 - 99 mg/dL   BUN 16  6 - 23 mg/dL   Creatinine, Ser 2.95  0.50 - 1.35 mg/dL   Calcium 9.5  8.4 - 62.1 mg/dL   Total Protein 7.0  6.0 - 8.3 g/dL   Albumin 3.2 (*) 3.5 - 5.2 g/dL   AST 18  0 - 37 U/L   ALT 23  0 - 53 U/L   Alkaline Phosphatase 93  39 - 117 U/L   Total Bilirubin 0.4  0.3 - 1.2 mg/dL   GFR calc non Af Amer 68 (*) >90 mL/min   GFR calc Af Amer 79 (*) >90 mL/min  LACTIC ACID, PLASMA      Result Value Range   Lactic Acid, Venous 2.8 (*) 0.5 - 2.2 mmol/L  URINALYSIS, ROUTINE W REFLEX MICROSCOPIC      Result Value Range   Color, Urine YELLOW  YELLOW   APPearance CLEAR  CLEAR    Specific Gravity, Urine 1.015  1.005 - 1.030   pH 5.5  5.0 - 8.0   Glucose, UA >1000 (*) NEGATIVE mg/dL   Hgb urine dipstick MODERATE (*) NEGATIVE   Bilirubin Urine  NEGATIVE  NEGATIVE   Ketones, ur NEGATIVE  NEGATIVE mg/dL   Protein, ur 30 (*) NEGATIVE mg/dL   Urobilinogen, UA 0.2  0.0 - 1.0 mg/dL   Nitrite NEGATIVE  NEGATIVE   Leukocytes, UA SMALL (*) NEGATIVE  TROPONIN I      Result Value Range   Troponin I <0.30  <0.30 ng/mL  CBC WITH DIFFERENTIAL      Result Value Range   WBC 11.3 (*) 4.0 - 10.5 K/uL   RBC 4.73  4.22 - 5.81 MIL/uL   Hemoglobin 12.5 (*) 13.0 - 17.0 g/dL   HCT 40.9 (*) 81.1 - 91.4 %   MCV 80.1  78.0 - 100.0 fL   MCH 26.4  26.0 - 34.0 pg   MCHC 33.0  30.0 - 36.0 g/dL   RDW 78.2  95.6 - 21.3 %   Platelets 233  150 - 400 K/uL   Neutrophils Relative 73  43 - 77 %   Neutro Abs 8.2 (*) 1.7 - 7.7 K/uL   Lymphocytes Relative 15  12 - 46 %   Lymphs Abs 1.7  0.7 - 4.0 K/uL   Monocytes Relative 11  3 - 12 %   Monocytes Absolute 1.2 (*) 0.1 - 1.0 K/uL   Eosinophils Relative 0  0 - 5 %   Eosinophils Absolute 0.1  0.0 - 0.7 K/uL   Basophils Relative 1  0 - 1 %   Basophils Absolute 0.1  0.0 - 0.1 K/uL  URINE MICROSCOPIC-ADD ON      Result Value Range   WBC, UA TOO NUMEROUS TO COUNT  <3 WBC/hpf   RBC / HPF 11-20  <3 RBC/hpf   Bacteria, UA MANY (*) RARE   Dg Chest 2 View  08/13/2012  *RADIOLOGY REPORT*  Clinical Data: Fever, cough  CHEST - 2 VIEW  Comparison: 10/05/2010  Findings: Normal heart size, mediastinal contours, and pulmonary vascularity. Scarring at mid lungs bilaterally unchanged. Bronchitic changes without infiltrate, pleural effusion, or pneumothorax. No acute osseous findings.  IMPRESSION: Chronic bronchitic changes with mid lung scarring bilaterally. No acute abnormalities.   Original Report Authenticated By: Ulyses Southward, M.D.       MDM  Iv ns. Labs. Cxr. Monitor. Ecg.  Iv ns bolus.  Reviewed nursing notes and prior charts for additional history.    Alb and atrovent neb.    Date: 08/13/2012  Rate: 156  Rhythm: sinus tachycardia  QRS Axis: left  Intervals: normal  ST/T Wave abnormalities: nonspecific ST/T changes  Conduction Disutrbances:none  Narrative Interpretation:   Old EKG Reviewed: changes noted  Tylenol for fever.  Recheck hr 115, sinus tach on monitor.   ua positive, urine culture sent.   Additional ns iv bolus.  Recheck abd soft nt.   Rocephin iv.   Med service called for admission re uti r/o urosepsis.               Suzi Roots, MD 08/13/12 1736  Suzi Roots, MD 08/13/12 [phone]

## 2012-08-13 NOTE — ED Notes (Signed)
Chill , body aches,  Onset last night, No NV, diarrhea yesterday.

## 2012-08-13 NOTE — H&P (Signed)
[facility] History and Physical  [name] ZOX:[id number] DOB: 27-Apr-1941 DOA: 08/13/2012   PCP: Syliva Overman, MD   Chief Complaint: Chills, dysuria, urinary incontinence.  HPI: [name] is a 72 y.o. male describes chills, dysuria and urinary incontinence for the last couple of days. He became unwell today that he had to come to the emergency room. There is no nausea, vomiting or any significant abdominal pain. He has not been lightheaded and has not been confused. When he was evaluated in the emergency room, he was found to have a fever and a resting tachycardia of 160. We are now asked to admit.   Review of Systems:   Apart from history of present illness, other systems negative.  Past Medical History  Diagnosis Date  . Arthritis of knee   . GERD (gastroesophageal reflux disease)   . CAD (coronary artery disease)   . Obesity   . Hyperlipidemia   . Diabetes mellitus, type 1   . Hypertension   . Chronic back pain   . Gout   . Chronic bronchitis   . Psoriasis    Past Surgical History  Procedure Laterality Date  . Appendectomy    . Stent in heart  2009  . Cardiac catheterization      3or 4 stents  . Colonoscopy  03/17/2012    Procedure: COLONOSCOPY;  Surgeon: Malissa Hippo, MD;  Location: [facility];  Service: Endoscopy;  Laterality: N/A;  830   Social History:  Patient has been married for 8 years and lives with his wife. He does not drink alcohol. He does not smoke cigarettes, he quit several years ago.   No Known Allergies  Family History  Problem Relation Age of Onset  . Hypertension Mother   . Diabetes Mother   . Hypertension Father   . Diabetes Brother   . Arthritis      Family History   . Diabetes      family History       Prior to Admission medications   Medication Sig Start Date End Date Taking? Authorizing Provider  albuterol (PROAIR HFA) 108 (90 BASE) MCG/ACT inhaler Inhale 2 puffs into the lungs every 6 (six) hours as  needed for wheezing or shortness of breath.   Yes Historical Provider, MD  allopurinol (ZYLOPRIM) 300 MG tablet Take 300 mg by mouth daily. 06/11/12  Yes Kerri Perches, MD  levothyroxine (SYNTHROID, LEVOTHROID) 75 MCG tablet Take 75 mcg by mouth daily.   Yes Historical Provider, MD  lisinopril (PRINIVIL,ZESTRIL) 40 MG tablet Take 1 tablet (40 mg total) by mouth daily. 06/11/12 06/11/13 Yes Kerri Perches, MD  metFORMIN (GLUCOPHAGE) 500 MG tablet Take 1,000 mg by mouth 2 (two) times daily. 06/11/12  Yes Kerri Perches, MD  metoprolol (LOPRESSOR) 100 MG tablet Take 1 tablet (100 mg total) by mouth 2 (two) times daily. 06/11/12  Yes Kerri Perches, MD   Physical Exam: Filed Vitals:   08/13/12 1548 08/13/12 1807  BP: 164/94   Pulse: 162   Temp: 101.4 F (38.6 C)   TempSrc: Oral   Resp: 36   Height: 5\' 11"  (1.803 m)   Weight: 106.595 kg (235 lb)   SpO2: 93% 95%     General:  He actually looks systemically well. Is not toxic or septic.  Eyes: No pallor. No jaundice.  ENT: Unremarkable.  Neck: No lymphadenopathy.  Cardiovascular: Resting sinus tachycardia. No murmurs or gallop rhythm.  Respiratory: Lung fields are clear.  Abdomen: Soft, nontender. No masses.  Skin: No rash. Eczema.  Musculoskeletal: No major abnormalities.  Psychiatric: Appropriate affect.  Neurologic: Alert and orientated without any focal neurological signs  Labs on Admission:  Basic Metabolic Panel:  Recent Labs Lab 08/13/12 1645  NA 131*  K 3.5  CL 93*  CO2 26  GLUCOSE 370*  BUN 16  CREATININE 1.07  CALCIUM 9.5   Liver Function Tests:  Recent Labs Lab 08/13/12 1645  AST 18  ALT 23  ALKPHOS 93  BILITOT 0.4  PROT 7.0  ALBUMIN 3.2*     CBC:  Recent Labs Lab 08/13/12 1645  WBC 11.3*  NEUTROABS 8.2*  HGB 12.5*  HCT 37.9*  MCV 80.1  PLT 233   Cardiac Enzymes:  Recent Labs Lab 08/13/12 1645  TROPONINI <0.30      Radiological Exams on Admission: Dg Chest  2 View  08/13/2012  *RADIOLOGY REPORT*  Clinical Data: Fever, cough  CHEST - 2 VIEW  Comparison: 10/05/2010  Findings: Normal heart size, mediastinal contours, and pulmonary vascularity. Scarring at mid lungs bilaterally unchanged. Bronchitic changes without infiltrate, pleural effusion, or pneumothorax. No acute osseous findings.  IMPRESSION: Chronic bronchitic changes with mid lung scarring bilaterally. No acute abnormalities.   Original Report Authenticated By: Ulyses Southward, M.D.     EKG: Independently reviewed. Probably sinus tachycardia although SVT cannot be excluded.  Assessment/Plan   1. UTI with sepsis. 2. Hypertension. 3. Type 2 diabetes mellitus. 4. Obesity. 5. Hypothyroidism. 6. Coronary artery disease, stable.  Plan: 1. Admit. 2. Intravenous fluids aggressively. 3. Intravenous antibiotics. Further recommendations will depend on patient's hospital progress.   Code Status: Full code.   Family Communication: Discussed plan with patient at the bedside.   Disposition Plan: Home when medically stable.   Time spent: 45 minutes.  Wilson Singer [facility] Pager [phone].  If 7PM-7AM, please contact night-coverage [email or web address] Password [facility] 08/13/2012, 6:10 PM

## 2012-08-14 DIAGNOSIS — I2510 Atherosclerotic heart disease of native coronary artery without angina pectoris: Secondary | ICD-10-CM

## 2012-08-14 LAB — CBC
Hemoglobin: 10.8 g/dL — ABNORMAL LOW (ref 13.0–17.0)
MCH: 26.3 pg (ref 26.0–34.0)
MCV: 80 fL (ref 78.0–100.0)
RBC: 4.1 MIL/uL — ABNORMAL LOW (ref 4.22–5.81)
WBC: 9.4 10*3/uL (ref 4.0–10.5)

## 2012-08-14 LAB — COMPREHENSIVE METABOLIC PANEL
AST: 19 U/L (ref 0–37)
Albumin: 2.8 g/dL — ABNORMAL LOW (ref 3.5–5.2)
Calcium: 8.7 mg/dL (ref 8.4–10.5)
Creatinine, Ser: 0.94 mg/dL (ref 0.50–1.35)
GFR calc non Af Amer: 83 mL/min — ABNORMAL LOW (ref >90)

## 2012-08-14 LAB — GLUCOSE, CAPILLARY
Glucose-Capillary: 241 mg/dL — ABNORMAL HIGH (ref 70–99)
Glucose-Capillary: 119 mg/dL — ABNORMAL HIGH (ref 70–99)

## 2012-08-14 MED ORDER — GLIPIZIDE 5 MG PO TABS
5.0000 mg | ORAL_TABLET | Freq: Every day | ORAL | Status: DC
  Administered 2012-08-14 – 2012-08-15 (×2): 5 mg via ORAL
  Filled 2012-08-14 (×2): qty 1

## 2012-08-14 MED ORDER — ACETAMINOPHEN 325 MG PO TABS
650.0000 mg | ORAL_TABLET | Freq: Four times a day (QID) | ORAL | Status: DC | PRN
  Administered 2012-08-14: 650 mg via ORAL

## 2012-08-14 NOTE — Progress Notes (Signed)
     Subjective: This man feels much improved. He has no further chills and no documented fevers. He appears to have more control over micturition.           Physical Exam: Blood pressure 144/71, pulse 86, temperature 98.5 F (36.9 C), temperature source Oral, resp. rate 20, height 5\' 11"  (1.803 m), weight 102.8 kg (226 lb 10.1 oz), SpO2 94.00%. Looks systemically well. Is not toxic or septic. Abdomen is soft and nontender. Lung fields are clear. He is alert and orientated.   Investigations:     Basic Metabolic Panel:  Recent Labs  16/10/96 1645 08/14/12 0644  NA 131* 135  K 3.5 3.6  CL 93* 99  CO2 26 26  GLUCOSE 370* 262*  BUN 16 12  CREATININE 1.07 0.94  CALCIUM 9.5 8.7   Liver Function Tests:  Recent Labs  08/13/12 1645 08/14/12 0644  AST 18 19  ALT 23 20  ALKPHOS 93 71  BILITOT 0.4 0.4  PROT 7.0 6.3  ALBUMIN 3.2* 2.8*     CBC:  Recent Labs  08/13/12 1645 08/14/12 0644  WBC 11.3* 9.4  NEUTROABS 8.2*  --   HGB 12.5* 10.8*  HCT 37.9* 32.8*  MCV 80.1 80.0  PLT 233 217    Dg Chest 2 View  08/13/2012  *RADIOLOGY REPORT*  Clinical Data: Fever, cough  CHEST - 2 VIEW  Comparison: 10/05/2010  Findings: Normal heart size, mediastinal contours, and pulmonary vascularity. Scarring at mid lungs bilaterally unchanged. Bronchitic changes without infiltrate, pleural effusion, or pneumothorax. No acute osseous findings.  IMPRESSION: Chronic bronchitic changes with mid lung scarring bilaterally. No acute abnormalities.   Original Report Authenticated By: Ulyses Southward, M.D.       Medications: I have reviewed the patient's current medications.  Impression: 1. UTI with sepsis, improving clinically. 2. Type 2 diabetes mellitus, uncontrolled. 3. Hypertension. 4. Coronary artery disease, stable. 5. Obesity.     Plan: 1. Reduce IV fluids. 2. Continue with IV Rocephin. 3. Add glipizide 5 mg daily in addition to metformin for diabetes. 4.  Mobilize.     LOS: 1 day   Wilson Singer Pager [phone]  08/14/2012, 9:02 AM

## 2012-08-14 NOTE — Progress Notes (Signed)
Patient ambulating hallways with supervision this afternoon.  Tolerating well.

## 2012-08-15 DIAGNOSIS — J42 Unspecified chronic bronchitis: Secondary | ICD-10-CM

## 2012-08-15 LAB — BASIC METABOLIC PANEL WITH GFR
BUN: 11 mg/dL (ref 6–23)
CO2: 27 meq/L (ref 19–32)
Calcium: 8.6 mg/dL (ref 8.4–10.5)
Chloride: 100 meq/L (ref 96–112)
Creatinine, Ser: 0.83 mg/dL (ref 0.50–1.35)
GFR calc Af Amer: >90 mL/min
GFR calc non Af Amer: 87 mL/min — ABNORMAL LOW
Glucose, Bld: 187 mg/dL — ABNORMAL HIGH (ref 70–99)
Potassium: 3.7 meq/L (ref 3.5–5.1)
Sodium: 135 meq/L (ref 135–145)

## 2012-08-15 LAB — CBC
HCT: 34.5 % — ABNORMAL LOW (ref 39.0–52.0)
Hemoglobin: 11.1 g/dL — ABNORMAL LOW (ref 13.0–17.0)
MCV: 80.6 fL (ref 78.0–100.0)
RDW: 14.6 % (ref 11.5–15.5)
WBC: 8.8 10*3/uL (ref 4.0–10.5)

## 2012-08-15 LAB — GLUCOSE, CAPILLARY: Glucose-Capillary: 188 mg/dL — ABNORMAL HIGH (ref 70–99)

## 2012-08-15 MED ORDER — CIPROFLOXACIN HCL 500 MG PO TABS: 500.0000 mg | ORAL_TABLET | Freq: Two times a day (BID) | ORAL | Status: DC

## 2012-08-15 NOTE — Progress Notes (Signed)
IV removed, site WNL.  Pt given d/c instructions and new prescription.  Discussed home care (drinking fluids etc) with patient and discussed home medications, patient verbalizes understanding, teachback completed. F/U appointment to be made by pt tomorrow when office opens, pt states they will make and keep appointment. Pt is stable at this time. Pt taken to main entrance in wheelchair by staff member.

## 2012-08-15 NOTE — Discharge Summary (Signed)
Physician Discharge Summary  [name]:[id number] DOB: 25-Dec-1940 DOA: 08/13/2012  PCP: Syliva Overman, MD  Admit date: 08/13/2012 Discharge date: 08/15/2012  Time spent: Greater than 30 minutes  Recommendations for Outpatient Follow-up:  1. Follow with primary care physician in the next few days to followup of urine culture and possible need for home oxygen.   Discharge Diagnoses:  1. UTI with sepsis, improved. Urine culture pending. 2. COPD,? Need for home oxygen. 3. Type 2 diabetes mellitus. 4. Hypertension. 5. Coronary artery disease, stable. 6. Hypothyroidism. 7. Obesity.   Discharge Condition: Stable and significantly improved.  Diet recommendation: Carbohydrate modified diet.  Filed Weights   08/13/12 1548 08/13/12 1917  Weight: 106.595 kg (235 lb) 102.8 kg (226 lb 10.1 oz)    History of present illness:  This is a 72 year old man presented to the hospital with symptoms of chills, dysuria and urinary incontinence. Please see initial history as outlined below: HPI: [name] is a 72 y.o. male describes chills, dysuria and urinary incontinence for the last couple of days. He became unwell today that he had to come to the emergency room. There is no nausea, vomiting or any significant abdominal pain. He has not been lightheaded and has not been confused. When he was evaluated in the emergency room, he was found to have a fever and a resting tachycardia of 160. We are now asked to admit.  Hospital Course:  He was felt to be almost in septic shock secondary to UTI. He was hydrated intravenously aggressively. He was started on intravenous Rocephin. He made a rapid improvement within 24 hours. Today he feels back to his usual self. Urine culture is still not available at the present time. He has no nausea, vomiting or abdominal pain. He is tolerating diet. He is mobilizing well. There is a possibility he does require home oxygen and this can be evaluated by his  primary care physician. He is stable for discharge. Urine culture must be followed up to make sure that ciprofloxacin is sensitive to the organism identified.  Procedures:  None.  Consultations:  None.  Discharge Exam: Filed Vitals:   08/14/12 1832 08/14/12 2031 08/14/12 2058 08/15/12 0624  BP:   142/61 151/76  Pulse:  83 84 98  Temp: 98.7 F (37.1 C)  98.7 F (37.1 C) 98.1 F (36.7 C)  TempSrc:   Oral Oral  Resp:  20 20 22   Height:      Weight:      SpO2:  93% 97% 98%    General: He looks systemically well. Is not toxic or septic. Cardiovascular: Heart sounds are present and normal without murmurs or added sounds. Respiratory: Lung fields are entirely clear, slightly reduced air entry. No wheezing or bronchial breathing. He is alert and orientated.  Discharge Instructions  Discharge Orders   Future Appointments Provider Department Dept Phone   10/20/2012 10:30 AM Kerri Perches, MD [facility] [phone]   Future Orders Complete By Expires     Diet - low sodium heart healthy  As directed     Increase activity slowly  As directed         Medication List    TAKE these medications       allopurinol 300 MG tablet  Commonly known as:  ZYLOPRIM  Take 300 mg by mouth daily.     aspirin EC 325 MG tablet  Take 325 mg by mouth daily.     ciprofloxacin 500 MG tablet  Commonly  known as:  CIPRO  Take 1 tablet (500 mg total) by mouth 2 (two) times daily.     HUMALOG KWIKPEN 100 UNIT/ML injection  Generic drug:  insulin lispro  Inject 40 Units into the skin at bedtime.     insulin glargine 100 units/mL Soln  Commonly known as:  LANTUS  Inject 0-13 Units into the skin 3 (three) times daily.     levothyroxine 75 MCG tablet  Commonly known as:  SYNTHROID, LEVOTHROID  Take 75 mcg by mouth daily.     lisinopril 40 MG tablet  Commonly known as:  PRINIVIL,ZESTRIL  Take 1 tablet (40 mg total) by mouth daily.     metFORMIN 500 MG tablet  Commonly  known as:  GLUCOPHAGE  Take 1,000 mg by mouth 2 (two) times daily.     metoprolol 100 MG tablet  Commonly known as:  LOPRESSOR  Take 1 tablet (100 mg total) by mouth 2 (two) times daily.     PROAIR HFA 108 (90 BASE) MCG/ACT inhaler  Generic drug:  albuterol  Inhale 2 puffs into the lungs every 6 (six) hours as needed for wheezing or shortness of breath.          The results of significant diagnostics from this hospitalization (including imaging, microbiology, ancillary and laboratory) are listed below for reference.    Significant Diagnostic Studies: Dg Chest 2 View  08/13/2012  *RADIOLOGY REPORT*  Clinical Data: Fever, cough  CHEST - 2 VIEW  Comparison: 10/05/2010  Findings: Normal heart size, mediastinal contours, and pulmonary vascularity. Scarring at mid lungs bilaterally unchanged. Bronchitic changes without infiltrate, pleural effusion, or pneumothorax. No acute osseous findings.  IMPRESSION: Chronic bronchitic changes with mid lung scarring bilaterally. No acute abnormalities.   Original Report Authenticated By: Ulyses Southward, M.D.         Labs: Basic Metabolic Panel:  Recent Labs Lab 08/13/12 1645 08/14/12 0644 08/15/12 0647  NA 131* 135 135  K 3.5 3.6 3.7  CL 93* 99 100  CO2 26 26 27   GLUCOSE 370* 262* 187*  BUN 16 12 11   CREATININE 1.07 0.94 0.83  CALCIUM 9.5 8.7 8.6   Liver Function Tests:  Recent Labs Lab 08/13/12 1645 08/14/12 0644  AST 18 19  ALT 23 20  ALKPHOS 93 71  BILITOT 0.4 0.4  PROT 7.0 6.3  ALBUMIN 3.2* 2.8*     CBC:  Recent Labs Lab 08/13/12 1645 08/14/12 0644 08/15/12 0647  WBC 11.3* 9.4 8.8  NEUTROABS 8.2*  --   --   HGB 12.5* 10.8* 11.1*  HCT 37.9* 32.8* 34.5*  MCV 80.1 80.0 80.6  PLT 233 217 210   Cardiac Enzymes:  Recent Labs Lab 08/13/12 1645  TROPONINI <0.30    CBG:  Recent Labs Lab 08/14/12 0721 08/14/12 1105 08/14/12 1601 08/14/12 2117 08/15/12 0713  GLUCAP 241* 234* 133* 119* 188*        Signed:  Keyler Hoge C  [facility] 08/15/2012, 8:48 AM

## 2012-08-17 ENCOUNTER — Encounter: Payer: Self-pay | Admitting: Family Medicine

## 2012-08-17 ENCOUNTER — Ambulatory Visit (INDEPENDENT_AMBULATORY_CARE_PROVIDER_SITE_OTHER): Payer: Medicare Other | Admitting: Family Medicine

## 2012-08-17 VITALS — BP 160/86 | HR 70 | Resp 18 | Ht 71.0 in | Wt 234.0 lb

## 2012-08-17 DIAGNOSIS — E1065 Type 1 diabetes mellitus with hyperglycemia: Secondary | ICD-10-CM

## 2012-08-17 DIAGNOSIS — I10 Essential (primary) hypertension: Secondary | ICD-10-CM

## 2012-08-17 DIAGNOSIS — IMO0001 Reserved for inherently not codable concepts without codable children: Secondary | ICD-10-CM

## 2012-08-17 DIAGNOSIS — E785 Hyperlipidemia, unspecified: Secondary | ICD-10-CM

## 2012-08-17 DIAGNOSIS — A419 Sepsis, unspecified organism: Secondary | ICD-10-CM

## 2012-08-17 DIAGNOSIS — N390 Urinary tract infection, site not specified: Secondary | ICD-10-CM

## 2012-08-17 LAB — URINE CULTURE: Colony Count: >=100000

## 2012-08-17 MED ORDER — AMLODIPINE BESYLATE 5 MG PO TABS: 5.0000 mg | ORAL_TABLET | Freq: Every day | ORAL | Status: DC

## 2012-08-17 MED ORDER — CIPROFLOXACIN HCL 500 MG PO TABS: 500.0000 mg | ORAL_TABLET | Freq: Two times a day (BID) | ORAL | Status: AC

## 2012-08-17 NOTE — Patient Instructions (Addendum)
F/u in 6 weeks.  You will get one additional week of ciprofloxacin , you need to rept urinalysis in 2 weeks, and you will be referred to urology for further eval  New additional med for blood pressure, amlodipine 5mg  one daily, continue what you are  currently on  You will be referred for evaluation for the need for oxygen at home during sleep  Chem 7 non fasting in 6 weeks just before f/u visit

## 2012-08-18 NOTE — Progress Notes (Signed)
UR Chart Review Completed  

## 2012-08-22 NOTE — Progress Notes (Signed)
  Subjective:    Patient ID: [name], male    DOB: 1940-10-02, 72 y.o.   MRN: [id number]  HPI Pt in for follow up of recent hospitalization for urosepsis.He feels back to himself, is almost finished the ciprofloxacin prescribed, denies fatigue , flank pain fever or chills. Has had UTI in the past, needs urology f/u Reports improvement in his blood sugars though still not at goal, being treated by endocrinology   Review of Systems See HPI Denies recent fever or chills. Denies sinus pressure, nasal congestion, ear pain or sore throat. Denies chest congestion, productive cough or wheezing. Denies chest pains, palpitations and leg swelling Denies abdominal pain, nausea, vomiting,diarrhea or constipation.   Denies dysuria, frequency, hesitancy or incontinence. Denies joint pain, swelling and limitation in mobility. Denies headaches, seizures, numbness, or tingling. Denies depression, anxiety or insomnia. Denies skin break down or rash.        Objective:   Physical Exam Patient alert and oriented and in no cardiopulmonary distress.  HEENT: No facial asymmetry, EOMI, no sinus tenderness,  oropharynx pink and moist.  Neck supple no adenopathy.  Chest: Clear to auscultation bilaterally.  CVS: S1, S2 no murmurs, no S3.  ABD: Soft non tender. Bowel sounds normal.  Ext: No edema  MS: Adequate ROM spine, shoulders, hips and knees.  Skin: Intact, no ulcerations or rash noted.  Psych: Good eye contact, normal affect. Memory intact not anxious or depressed appearing.  CNS: CN 2-12 intact, power, tone and sensation normal throughout.        Assessment & Plan:

## 2012-08-22 NOTE — Assessment & Plan Note (Signed)
Improving, followed by endo

## 2012-08-22 NOTE — Assessment & Plan Note (Signed)
Hyperlipidemia:Low fat diet discussed and encouraged.  Will send for updated lab from endo

## 2012-08-22 NOTE — Assessment & Plan Note (Signed)
Uncontrolled , add amlodipine DASH diet and commitment to daily physical activity for a minimum of 30 minutes discussed and encouraged, as a part of hypertension management. The importance of attaining a healthy weight is also discussed.  

## 2012-08-22 NOTE — Assessment & Plan Note (Signed)
Much improved, recently hospitalized for urosepsis

## 2012-08-22 NOTE — Assessment & Plan Note (Signed)
Hospitalized las t week for urosepsis, needs additional 1 week of cipro and also f/u with urology. Will rept UA on completion of antibioitc therapy

## 2012-08-30 ENCOUNTER — Other Ambulatory Visit: Payer: Self-pay | Admitting: Family Medicine

## 2012-08-30 ENCOUNTER — Telehealth: Payer: Self-pay | Admitting: Family Medicine

## 2012-08-30 DIAGNOSIS — R0902 Hypoxemia: Secondary | ICD-10-CM | POA: Insufficient documentation

## 2012-08-30 DIAGNOSIS — G4734 Idiopathic sleep related nonobstructive alveolar hypoventilation: Secondary | ICD-10-CM

## 2012-08-30 NOTE — Telephone Encounter (Signed)
Please let pt know based on recent sleep test he does qualify for supplemental oxygen when sleeping, and I will write a script, pls send to [facility] and follow through on this, thanks

## 2012-08-31 NOTE — Telephone Encounter (Signed)
Patient aware and rx sent to [facility].

## 2012-09-09 LAB — BASIC METABOLIC PANEL
BUN: 15 mg/dL (ref 6–23)
Creat: 0.84 mg/dL (ref 0.50–1.35)
Glucose, Bld: 144 mg/dL — ABNORMAL HIGH (ref 70–99)

## 2012-09-13 ENCOUNTER — Encounter: Payer: Self-pay | Admitting: Family Medicine

## 2012-09-17 ENCOUNTER — Emergency Department (HOSPITAL_COMMUNITY)
Admission: EM | Admit: 2012-09-17 | Discharge: 2012-09-17 | Disposition: A | Discharge status: Home / Self Care | Payer: Medicare Other | Attending: Emergency Medicine | Admitting: Emergency Medicine

## 2012-09-17 ENCOUNTER — Encounter (HOSPITAL_COMMUNITY): Payer: Self-pay | Admitting: Emergency Medicine

## 2012-09-17 DIAGNOSIS — Z8719 Personal history of other diseases of the digestive system: Secondary | ICD-10-CM | POA: Insufficient documentation

## 2012-09-17 DIAGNOSIS — R109 Unspecified abdominal pain: Secondary | ICD-10-CM | POA: Insufficient documentation

## 2012-09-17 DIAGNOSIS — Z794 Long term (current) use of insulin: Secondary | ICD-10-CM | POA: Insufficient documentation

## 2012-09-17 DIAGNOSIS — R05 Cough: Secondary | ICD-10-CM | POA: Insufficient documentation

## 2012-09-17 DIAGNOSIS — Z8639 Personal history of other endocrine, nutritional and metabolic disease: Secondary | ICD-10-CM | POA: Insufficient documentation

## 2012-09-17 DIAGNOSIS — Z79899 Other long term (current) drug therapy: Secondary | ICD-10-CM | POA: Insufficient documentation

## 2012-09-17 DIAGNOSIS — Z7982 Long term (current) use of aspirin: Secondary | ICD-10-CM | POA: Insufficient documentation

## 2012-09-17 DIAGNOSIS — Z87891 Personal history of nicotine dependence: Secondary | ICD-10-CM | POA: Insufficient documentation

## 2012-09-17 DIAGNOSIS — Z862 Personal history of diseases of the blood and blood-forming organs and certain disorders involving the immune mechanism: Secondary | ICD-10-CM | POA: Insufficient documentation

## 2012-09-17 DIAGNOSIS — Z872 Personal history of diseases of the skin and subcutaneous tissue: Secondary | ICD-10-CM | POA: Insufficient documentation

## 2012-09-17 DIAGNOSIS — Z9861 Coronary angioplasty status: Secondary | ICD-10-CM | POA: Insufficient documentation

## 2012-09-17 DIAGNOSIS — Z8739 Personal history of other diseases of the musculoskeletal system and connective tissue: Secondary | ICD-10-CM | POA: Insufficient documentation

## 2012-09-17 DIAGNOSIS — M25559 Pain in unspecified hip: Secondary | ICD-10-CM | POA: Insufficient documentation

## 2012-09-17 DIAGNOSIS — M109 Gout, unspecified: Secondary | ICD-10-CM | POA: Insufficient documentation

## 2012-09-17 DIAGNOSIS — I2510 Atherosclerotic heart disease of native coronary artery without angina pectoris: Secondary | ICD-10-CM | POA: Insufficient documentation

## 2012-09-17 DIAGNOSIS — R0602 Shortness of breath: Secondary | ICD-10-CM | POA: Insufficient documentation

## 2012-09-17 DIAGNOSIS — I10 Essential (primary) hypertension: Secondary | ICD-10-CM | POA: Insufficient documentation

## 2012-09-17 DIAGNOSIS — E109 Type 1 diabetes mellitus without complications: Secondary | ICD-10-CM | POA: Insufficient documentation

## 2012-09-17 DIAGNOSIS — R059 Cough, unspecified: Secondary | ICD-10-CM | POA: Insufficient documentation

## 2012-09-17 DIAGNOSIS — M25551 Pain in right hip: Secondary | ICD-10-CM

## 2012-09-17 DIAGNOSIS — Z8709 Personal history of other diseases of the respiratory system: Secondary | ICD-10-CM | POA: Insufficient documentation

## 2012-09-17 DIAGNOSIS — E669 Obesity, unspecified: Secondary | ICD-10-CM | POA: Insufficient documentation

## 2012-09-17 MED ORDER — CYCLOBENZAPRINE HCL 10 MG PO TABS: 10.0000 mg | ORAL_TABLET | Freq: Two times a day (BID) | ORAL | Status: DC | PRN

## 2012-09-17 MED ORDER — NAPROXEN 500 MG PO TABS: 500.0000 mg | ORAL_TABLET | Freq: Two times a day (BID) | ORAL | Status: DC

## 2012-09-17 NOTE — ED Notes (Signed)
In for evaluation

## 2012-09-17 NOTE — ED Provider Notes (Signed)
History  This chart was scribed for Donnetta Hutching, MD by Bennett Scrape, ED Scribe. This patient was seen in room [facility]07/[facility]07 and the patient's care was started at 3:59 PM.  CSN: [id number]  Arrival date & time 09/17/12  1544   First MD Initiated Contact with Patient 09/17/12 1559      Chief Complaint  Patient presents with  . Cough  . Shortness of Breath  . Hip Pain     The history is provided by the patient. No language interpreter was used.    HPI Comments: [name] is a 72 y.o. male who presents to the Emergency Department complaining of gradual onset, gradually worsening, constant bilateral hip pain that started yesterday. He denies any recent injuries or falls. Wife reports that the pt recently mowed the lawn on a riding mower which is a new activity for him. Pt states that he has been ambulating without difficulty since the onset. He also c/o cough with associated achy abdominal pain from coughing. He denies emesis, SOB, fever and CP as associated symptoms. He has a h/o GERd, CAD, HLD and DM. Pt is a former smoker but denies alcohol use.   PCP is Dr. Lodema Hong.  Past Medical History  Diagnosis Date  . Arthritis of knee   . GERD (gastroesophageal reflux disease)   . CAD (coronary artery disease)   . Obesity   . Hyperlipidemia   . Diabetes mellitus, type 1   . Hypertension   . Chronic back pain   . Gout   . Chronic bronchitis   . Psoriasis     Past Surgical History  Procedure Laterality Date  . Appendectomy    . Stent in heart  2009  . Cardiac catheterization      3or 4 stents  . Colonoscopy  03/17/2012    Procedure: COLONOSCOPY;  Surgeon: Malissa Hippo, MD;  Location: [facility];  Service: Endoscopy;  Laterality: N/A;  830    Family History  Problem Relation Age of Onset  . Hypertension Mother   . Diabetes Mother   . Hypertension Father   . Diabetes Brother   . Arthritis      Family History   . Diabetes      family History     History   Substance Use Topics  . Smoking status: Former Games developer  . Smokeless tobacco: Not on file  . Alcohol Use: No      Review of Systems  A complete 10 system review of systems was obtained and all systems are negative except as noted in the HPI and PMH.   Allergies  Review of patient's allergies indicates no known allergies.  Home Medications   Current Outpatient Rx  Name  Route  Sig  Dispense  Refill  . albuterol (PROAIR HFA) 108 (90 BASE) MCG/ACT inhaler   Inhalation   Inhale 2 puffs into the lungs every 6 (six) hours as needed for wheezing or shortness of breath.         . allopurinol (ZYLOPRIM) 300 MG tablet   Oral   Take 300 mg by mouth daily.         Marland Kitchen amLODipine (NORVASC) 5 MG tablet   Oral   Take 1 tablet (5 mg total) by mouth daily.   30 tablet   11   . aspirin EC 325 MG tablet   Oral   Take 325 mg by mouth daily.         . ciprofloxacin (CIPRO) 500  MG tablet   Oral   Take 1 tablet (500 mg total) by mouth 2 (two) times daily.   10 tablet   0   . insulin glargine (LANTUS) 100 units/mL SOLN   Subcutaneous   Inject 0-13 Units into the skin 3 (three) times daily.          . insulin lispro (HUMALOG KWIKPEN) 100 UNIT/ML injection   Subcutaneous   Inject 40 Units into the skin at bedtime.         Marland Kitchen levothyroxine (SYNTHROID, LEVOTHROID) 75 MCG tablet   Oral   Take 75 mcg by mouth daily.         Marland Kitchen lisinopril (PRINIVIL,ZESTRIL) 40 MG tablet   Oral   Take 1 tablet (40 mg total) by mouth daily.   90 tablet   1   . metFORMIN (GLUCOPHAGE) 500 MG tablet   Oral   Take 1,000 mg by mouth 2 (two) times daily.         . metoprolol (LOPRESSOR) 100 MG tablet   Oral   Take 1 tablet (100 mg total) by mouth 2 (two) times daily.   60 tablet   3     Triage Vitals: BP 162/77  Pulse 84  Temp(Src) 98.7 F (37.1 C)  Resp 22  Ht 5\' 11"  (1.803 m)  Wt 240 lb (108.863 kg)  BMI 33.49 kg/m2  SpO2 93%  Physical Exam  Nursing note and vitals  reviewed. Constitutional: He is oriented to person, place, and time. He appears well-developed and well-nourished.  HENT:  Head: Normocephalic and atraumatic.  Eyes: Conjunctivae and EOM are normal. Pupils are equal, round, and reactive to light.  Neck: Normal range of motion. Neck supple.  Cardiovascular: Normal rate, regular rhythm and normal heart sounds.   Pulmonary/Chest: Effort normal and breath sounds normal.  Abdominal: Soft. Bowel sounds are normal.  Musculoskeletal: Normal range of motion.  Minimal lateral hip tenderness bilaterally, normal ROM  Neurological: He is alert and oriented to person, place, and time.  Skin: Skin is warm and dry.  Psychiatric: He has a normal mood and affect.    ED Course  Procedures (including critical care time)  DIAGNOSTIC STUDIES: Oxygen Saturation is 93% on room air, adequate by my interpretation.    COORDINATION OF CARE: 4:50 PM-Advised pt that the cough is most likely from allergies and the hip pain is a muscle strain. Informed pt that he appears stable and that no further testing is necessary. Discussed treatment plan which includes steroids with pt at bedside and pt agreed to plan. Advised pt to take Claritin to improve the cough.  Labs Reviewed - No data to display No results found.   No diagnosis found.    MDM  No x-ray necessary.  Patient is ambulatory. No respiratory distress.   Color good.  Discharge meds Naprosyn 500 mg #20 and Flexeril 10 mg #20     I personally performed the services described in this documentation, which was scribed in my presence. The recorded information has been reviewed and is accurate.     Donnetta Hutching, MD 09/17/12 [phone]

## 2012-09-17 NOTE — ED Notes (Signed)
Pt c/o np cough since yesterday with some recent sob. Pt states he has bronchitis. Pt also c/o bilateral hip pain x 2 days.

## 2012-09-24 ENCOUNTER — Other Ambulatory Visit: Payer: Self-pay | Admitting: Urology

## 2012-09-24 ENCOUNTER — Ambulatory Visit (INDEPENDENT_AMBULATORY_CARE_PROVIDER_SITE_OTHER): Payer: Medicare Other | Admitting: Urology

## 2012-09-24 DIAGNOSIS — N390 Urinary tract infection, site not specified: Secondary | ICD-10-CM

## 2012-09-24 DIAGNOSIS — N400 Enlarged prostate without lower urinary tract symptoms: Secondary | ICD-10-CM

## 2012-09-28 ENCOUNTER — Ambulatory Visit (INDEPENDENT_AMBULATORY_CARE_PROVIDER_SITE_OTHER): Payer: Medicare Other | Admitting: Family Medicine

## 2012-09-28 ENCOUNTER — Encounter: Payer: Self-pay | Admitting: Family Medicine

## 2012-09-28 VITALS — BP 170/90 | HR 80 | Resp 18 | Ht 71.0 in | Wt 243.1 lb

## 2012-09-28 DIAGNOSIS — G4734 Idiopathic sleep related nonobstructive alveolar hypoventilation: Secondary | ICD-10-CM

## 2012-09-28 DIAGNOSIS — M109 Gout, unspecified: Secondary | ICD-10-CM

## 2012-09-28 DIAGNOSIS — E039 Hypothyroidism, unspecified: Secondary | ICD-10-CM

## 2012-09-28 DIAGNOSIS — IMO0002 Reserved for concepts with insufficient information to code with codable children: Secondary | ICD-10-CM

## 2012-09-28 DIAGNOSIS — I10 Essential (primary) hypertension: Secondary | ICD-10-CM

## 2012-09-28 DIAGNOSIS — R0602 Shortness of breath: Secondary | ICD-10-CM

## 2012-09-28 DIAGNOSIS — E1065 Type 1 diabetes mellitus with hyperglycemia: Secondary | ICD-10-CM

## 2012-09-28 DIAGNOSIS — E785 Hyperlipidemia, unspecified: Secondary | ICD-10-CM

## 2012-09-28 MED ORDER — METOPROLOL TARTRATE 100 MG PO TABS: 100.0000 mg | ORAL_TABLET | Freq: Two times a day (BID) | ORAL | Status: DC

## 2012-09-28 NOTE — Patient Instructions (Addendum)
Annual wellness in 2 month  You need to take the amlodipine prescribed every day for your blood pressure along with the lisinopril and metoprolol.  Blood pressure is still high since you do not take meds as prescribed  Stop naproxen and allopurinol  Non fasting chem 7 and uric acid in 2 month, before next visit.  You will be referred to Dr Juanetta Gosling for breathing next 1 to 2 month

## 2012-09-28 NOTE — Progress Notes (Signed)
  Subjective:    Patient ID: [name], male    DOB: 28-May-1940, 72 y.o.   MRN: [id number]  HPI The PT is here for follow up and re-evaluation of chronic medical conditions, medication management and review of any available recent lab and radiology data.  Preventive health is updated, specifically  Cancer screening and Immunization.   Questions or concerns regarding consultations or procedures which the PT has had in the interim are  addressed. The PT denies any adverse reactions to current medications since the last visit.  C/o increased shortness of breath evenm without activity and wheezing, needs pulmonary eval    Review of Systems See HPI Denies recent fever or chills. Denies sinus pressure, nasal congestion, ear pain or sore throat.  Denies chest pains, palpitations and leg swelling Denies abdominal pain, nausea, vomiting,diarrhea or constipation.   Denies dysuria, frequency, hesitancy or incontinence. Denies joint pain, swelling and limitation in mobility. Denies headaches, seizures, numbness, or tingling. Denies depression, anxiety or insomnia. Denies skin break down        Objective:   Physical Exam  Patient alert and oriented and in no cardiopulmonary distress.  HEENT: No facial asymmetry, EOMI, no sinus tenderness,  oropharynx pink and moist.  Neck supple no adenopathy.  Chest: decreased air entry with wheeze  CVS: S1, S2 no murmurs, no S3.  ABD: Soft non tender. Bowel sounds normal.  Ext: No edema  MS: Adequate ROM spine, shoulders, hips and knees.  Skin: Intact, no ulcerations or rash noted.  Psych: Good eye contact, normal affect. Memory intact not anxious or depressed appearing.  CNS: CN 2-12 intact, power, tone and sensation normal throughout.       Assessment & Plan:

## 2012-09-29 ENCOUNTER — Ambulatory Visit (HOSPITAL_COMMUNITY)
Admission: RE | Admit: 2012-09-29 | Discharge: 2012-09-29 | Disposition: A | Discharge status: Home / Self Care | Payer: Medicare Other | Source: Ambulatory Visit | Attending: Urology | Admitting: Urology

## 2012-09-29 DIAGNOSIS — N390 Urinary tract infection, site not specified: Secondary | ICD-10-CM | POA: Insufficient documentation

## 2012-10-20 ENCOUNTER — Ambulatory Visit: Payer: Medicare Other | Admitting: Family Medicine

## 2012-10-20 ENCOUNTER — Other Ambulatory Visit (HOSPITAL_COMMUNITY): Payer: Self-pay

## 2012-10-20 DIAGNOSIS — J441 Chronic obstructive pulmonary disease with (acute) exacerbation: Secondary | ICD-10-CM

## 2012-10-24 NOTE — Assessment & Plan Note (Signed)
Uncontrolled, followed by endo Patient advised to reduce carb and sweets, commit to regular physical activity, take meds as prescribed, test blood as directed, and attempt to lose weight, to improve blood sugar control.  

## 2012-10-24 NOTE — Assessment & Plan Note (Signed)
Uncontrolled Pt to start amlodipine

## 2012-10-24 NOTE — Assessment & Plan Note (Signed)
Treated by endo 

## 2012-10-24 NOTE — Assessment & Plan Note (Signed)
Pt to start nocturnal oxygen as he qualifies

## 2012-10-24 NOTE — Assessment & Plan Note (Signed)
Updated lab to be obtained from endo. Hyperlipidemia:Low fat diet discussed and encouraged.

## 2012-10-24 NOTE — Assessment & Plan Note (Signed)
Reports worsening shortness of breath , will be referred to pulmonary for further eval

## 2012-10-27 ENCOUNTER — Ambulatory Visit (HOSPITAL_COMMUNITY)
Admission: RE | Admit: 2012-10-27 | Discharge: 2012-10-27 | Disposition: A | Discharge status: Home / Self Care | Payer: Medicare Other | Source: Ambulatory Visit | Attending: Pulmonary Disease | Admitting: Pulmonary Disease

## 2012-10-27 DIAGNOSIS — R0989 Other specified symptoms and signs involving the circulatory and respiratory systems: Secondary | ICD-10-CM | POA: Insufficient documentation

## 2012-10-27 DIAGNOSIS — J4489 Other specified chronic obstructive pulmonary disease: Secondary | ICD-10-CM | POA: Insufficient documentation

## 2012-10-27 DIAGNOSIS — J449 Chronic obstructive pulmonary disease, unspecified: Secondary | ICD-10-CM | POA: Insufficient documentation

## 2012-10-27 DIAGNOSIS — R0609 Other forms of dyspnea: Secondary | ICD-10-CM | POA: Insufficient documentation

## 2012-10-27 MED ORDER — ALBUTEROL SULFATE (5 MG/ML) 0.5% IN NEBU
2.5000 mg | INHALATION_SOLUTION | Freq: Once | RESPIRATORY_TRACT | Status: AC
  Administered 2012-10-27: 2.5 mg via RESPIRATORY_TRACT

## 2012-10-29 ENCOUNTER — Other Ambulatory Visit: Payer: Self-pay | Admitting: Urology

## 2012-10-29 ENCOUNTER — Ambulatory Visit (INDEPENDENT_AMBULATORY_CARE_PROVIDER_SITE_OTHER): Payer: Medicare Other | Admitting: Urology

## 2012-10-29 DIAGNOSIS — IMO0002 Reserved for concepts with insufficient information to code with codable children: Secondary | ICD-10-CM

## 2012-10-29 DIAGNOSIS — N390 Urinary tract infection, site not specified: Secondary | ICD-10-CM

## 2012-10-29 NOTE — Procedures (Signed)
NAMEDONTERRIUS, SANTUCCI              ACCOUNT NO.:  [id number]  MEDICAL RECORD NO.:  [id number]  LOCATION:  RESP                          FACILITY:  [facility]  PHYSICIAN:  Yadier Bramhall L. Juanetta Gosling, M.D.DATE OF BIRTH:  February 21, 1941  DATE OF PROCEDURE: DATE OF DISCHARGE:  10/27/2012                           PULMONARY FUNCTION TEST   REASON FOR PULMONARY FUNCTION TESTING:  COPD.  1. Spirometry shows a moderate-to-severe ventilatory defect with     evidence of airflow obstruction. 2. Lung volumes show air trapping. 3. DLCO is moderately reduced. 4. Airway resistance is elevated confirming the presence of airflow     obstruction. 5. There is improvement with inhaled bronchodilator, but does reach     the level of significance. 6. This study is consistent with the clinical diagnosis of COPD.     Danila Eddie L. Juanetta Gosling, M.D.     EL[facility]/MEDQ  D:  10/28/2012  T:  10/29/2012  Job:  [id number]

## 2012-11-01 ENCOUNTER — Encounter (HOSPITAL_COMMUNITY): Payer: Self-pay | Admitting: Pharmacy Technician

## 2012-11-02 LAB — PULMONARY FUNCTION TEST

## 2012-11-05 NOTE — Patient Instructions (Addendum)
EDGERRIN CORREIA  11/05/2012   Your procedure is scheduled on:  11/12/2012  Report to [facility] at  1030  AM.  Call this number if you have problems the morning of surgery: [phone]   Remember:   Do not eat food or drink liquids after midnight.   Take these medicines the morning of surgery with A SIP OF WATER:  Norvasc,synthroid, lisinopril, metoprolol   Do not wear jewelry, make-up or nail polish.  Do not wear lotions, powders, or perfumes.   Do not shave 48 hours prior to surgery. Men may shave face and neck.  Do not bring valuables to the hospital.  [facility] is not responsible  for any belongings or valuables.  Contacts, dentures or bridgework may not be worn into surgery.  Leave suitcase in the car. After surgery it may be brought to your room.  For patients admitted to the hospital, checkout time is 11:00 AM the day of discharge.   Patients discharged the day of surgery will not be allowed to drive  home.  Name and phone number of your driver: family  Special Instructions: Shower using CHG 2 nights before surgery and the night before surgery.  If you shower the day of surgery use CHG.  Use special wash - you have one bottle of CHG for all showers.  You should use approximately 1/3 of the bottle for each shower.   Please read over the following fact sheets that you were given: Pain Booklet, Coughing and Deep Breathing, MRSA Information, Surgical Site Infection Prevention, Anesthesia Post-op Instructions and Care and Recovery After Surgery Cystoscopy Cystoscopy is a procedure that is used to help your caregiver diagnose and sometimes treat conditions that affect your lower urinary tract. Your lower urinary tract includes your bladder and the tube through which urine passes from your bladder out of your body (urethra). Cystoscopy is performed with a thin, tube-shaped instrument (cystoscope). The cystoscope has lenses and a light at the end so that your caregiver can see inside  your bladder. The cystoscope is inserted at the entrance of your urethra. Your caregiver guides it through your urethra and into your bladder. There are two main types of cystoscopy:  Flexible cystoscopy (with a flexible cystoscope).  Rigid cystoscopy (with a rigid cystoscope). Cystoscopy may be recommended for many conditions, including:  Urinary tract infections.  Blood in your urine (hematuria).  Loss of bladder control (urinary incontinence) or overactive bladder.  Unusual cells found in a urine sample.  Urinary blockage.  Painful urination. Cystoscopy may also be done to remove a sample of your tissue to be checked under a microscope (biopsy). It may also be done to remove or destroy bladder stones. LET YOUR CAREGIVER KNOW ABOUT:  Allergies to food or medicine.  Medicines taken, including vitamins, herbs, eyedrops, over-the-counter medicines, and creams.  Use of steroids (by mouth or creams).  Previous problems with anesthetics or numbing medicines.  History of bleeding problems or blood clots.  Previous surgery.  Other health problems, including diabetes and kidney problems.  Possibility of pregnancy, if this applies. PROCEDURE The area around the opening to your urethra will be cleaned. A medicine to numb your urethra (local anesthetic) is used. If a tissue sample or stone is removed during the procedure, you may be given a medicine to make you sleep (general anesthetic). Your caregiver will gently insert the tip of the cystoscope into your urethra. The cystoscope will be slowly glided through your urethra and  into your bladder. Sterile fluid will flow through the cystoscope and into your bladder. The fluid will expand and stretch your bladder. This gives your caregiver a better view of your bladder walls. The procedure lasts about 15 20 minutes. AFTER THE PROCEDURE If a local anesthetic is used, you will be allowed to go home as soon as you are ready. If a general  anesthetic is used, you will be taken to a recovery area until you are stable. You may have temporary bleeding and burning on urination. Document Released: 05/02/2000 Document Revised: 01/28/2012 Document Reviewed: 10/27/2011 [facility] Patient Information 2014 [place], [place]. PATIENT INSTRUCTIONS POST-ANESTHESIA  IMMEDIATELY FOLLOWING SURGERY:  Do not drive or operate machinery for the first twenty four hours after surgery.  Do not make any important decisions for twenty four hours after surgery or while taking narcotic pain medications or sedatives.  If you develop intractable nausea and vomiting or a severe headache please notify your doctor immediately.  FOLLOW-UP:  Please make an appointment with your surgeon as instructed. You do not need to follow up with anesthesia unless specifically instructed to do so.  WOUND CARE INSTRUCTIONS (if applicable):  Keep a dry clean dressing on the anesthesia/puncture wound site if there is drainage.  Once the wound has quit draining you may leave it open to air.  Generally you should leave the bandage intact for twenty four hours unless there is drainage.  If the epidural site drains for more than 36-48 hours please call the anesthesia department.  QUESTIONS?:  Please feel free to call your physician or the hospital operator if you have any questions, and they will be happy to assist you.

## 2012-11-08 ENCOUNTER — Encounter (HOSPITAL_COMMUNITY): Payer: Self-pay

## 2012-11-08 ENCOUNTER — Encounter (HOSPITAL_COMMUNITY)
Admission: RE | Admit: 2012-11-08 | Discharge: 2012-11-08 | Disposition: A | Discharge status: Home / Self Care | Payer: Medicare Other | Source: Ambulatory Visit | Attending: Urology | Admitting: Urology

## 2012-11-08 HISTORY — DX: Chronic obstructive pulmonary disease, unspecified: J44.9

## 2012-11-08 LAB — BASIC METABOLIC PANEL
Calcium: 9.3 mg/dL (ref 8.4–10.5)
GFR calc Af Amer: >90 mL/min (ref >90)
GFR calc non Af Amer: 84 mL/min — ABNORMAL LOW (ref >90)
Potassium: 3.9 mEq/L (ref 3.5–5.1)
Sodium: 135 mEq/L (ref 135–145)

## 2012-11-09 ENCOUNTER — Encounter (HOSPITAL_COMMUNITY): Payer: Self-pay | Admitting: Anesthesiology

## 2012-11-10 NOTE — H&P (Signed)
roblems 1. Benign Prostatic Hypertrophy 600.00 2. Urinary Tract Infection 599.0  History of Present Illness  Mr. Tep returns today in f/u.   He has a history of BPH with BOO and recurrent UTI's and returns for further evaluation.   He has nocturia x 3 starting at 4 am. He has no dysuria or hematuria.  He doesn't have any trouble during the day.   He completed his sulfa but didn't stay on it for suppression as suggested.   His culture was positive and sensitive to the bactrim.   He has some urgency.   His PVR today is 31cc and his PF is 14cc/sec.   Past Medical History 1. History of  Acute Myocardial Infarction V12.59 2. History of  Arthritis V13.4 3. History of  Diabetes Mellitus 250.00 4. History of  Gout 274.9 5. History of  Hypercholesterolemia 272.0 6. History of  Hypertension 401.9  Surgical History 1. History of  Appendectomy 2. History of  Cath Stent Placement  Current Meds 1. Allopurinol 300 MG Oral Tablet; Therapy: (Recorded:09May2014) to 2. AmLODIPine Besylate 10 MG Oral Tablet; Therapy: (Recorded:09May2014) to 3. Aspirin 325 MG Oral Tablet; Therapy: (Recorded:09May2014) to 4. Benicar HCT TABS; Therapy: (Recorded:09May2014) to 5. Clotrimazole-Betamethasone 1-0.05 % External Cream; Therapy: (Recorded:09May2014) to 6. Glucophage 500 MG Oral Tablet; Therapy: 09May2014 to 7. Isosorbide Mononitrate ER 60 MG Oral Tablet Extended Release 24 Hour; Therapy:  (Recorded:09May2014) to 8. Metoprolol Tartrate 100 MG Oral Tablet; Therapy: (Recorded:09May2014) to 9. Oxygen Use; Therapy: 09May2014 to 10. Pravastatin Sodium 80 MG Oral Tablet; Therapy: (Recorded:09May2014) to 11. Sulfamethoxazole-TMP DS 800-160 MG Oral Tablet; take 1 tablet by mouth twice a day for 7 days   THEN TAKE 1 TABLET AT BE DTIME; Therapy: 09May2014 to (Evaluate:17Jul2014)  Requested   for: 09May2014; Last Rx:09May2014 12. Trilipix 135 MG Oral Capsule Delayed Release; Therapy: (Recorded:09May2014) to 13.  Tudorza Pressair AEPB; Therapy: (Recorded:13Jun2014) to  Allergies 1. No Known Drug Allergies  Family History 1. Family history of  Death In The Family Father 2. Family history of  Death In The Family Mother 3. Family history of  Diabetes Mellitus V18.0 4. Family history of  Family Health Status Number Of Children  Social History 1. Former Smoker V15.82 1/2 ppd for 30 year. Quit 19 years ago. 2. Marital History - Currently Married 3. Never Drank Alcohol 4. Occupation: Retired  Review of Systems  Gastrointestinal: no flank pain.  Constitutional: no fever.    Vitals Vital Signs [Data Includes: Last 1 Day]  13Jun2014 09:28AM  Blood Pressure: 149 / 74 Temperature: 98.1 F Heart Rate: 69  Physical Exam Constitutional: Well nourished and well developed . No acute distress.  Pulmonary: No respiratory distress and normal respiratory rhythm and effort.  Cardiovascular: Heart rate and rhythm are normal . No peripheral edema.    Results/Data Urine [Data Includes: Last 1 Day]   13Jun2014  COLOR YELLOW   APPEARANCE CLOUDY   SPECIFIC GRAVITY 1.025   pH 6.0   GLUCOSE 250 mg/dL  BILIRUBIN NEG   KETONE NEG mg/dL  BLOOD TRACE   PROTEIN TRACE mg/dL  UROBILINOGEN 0.2 mg/dL  NITRITE NEG   LEUKOCYTE ESTERASE SMALL   SQUAMOUS EPITHELIAL/HPF RARE   WBC 11-20 WBC/hpf  RBC 0-2 RBC/hpf  BACTERIA MANY   CRYSTALS NONE SEEN   CASTS NONE SEEN    Flow Rate: Voided 214 ml. A peak flow rate of 14ml/s, mean flow rate of 11ml/s and normal flow curve .  PVR: Ultrasound PVR 31 ml.      Procedure  Procedure: Cystoscopy   Indication: Frequent UTI.  Prep: The patient was prepped with betadine.  Antibiotic prophylaxis: Ciprofloxacin.  Procedure Note:  Urethral meatus:. No abnormalities.  Anterior urethra:. A moderate stricture was present (with a tight area in the distal bulb that wouldn't allow passage of the scope. ) involving the entire urethra but was not manipulated.  Prostatic  urethra:. Unable to access.  Bladder: Unable to access because of the stricture. The patient tolerated the procedure well.  Complications: None.    Assessment 1. Urinary Tract Infection 599.0 2. Ureteral Stricture 593.3   He has recurrent UTI's with urethral stricture disease.   Plan Benign Prostatic Hypertrophy (600.00)  1. UA With REFLEX  Requested for: 10Jun2014 Urinary Tract Infection (599.0)  2. Sulfamethoxazole-TMP DS 800-160 MG Oral Tablet; take 1 tablet by mouth twice a day for 7 days  THEN TAKE 1 TABLET AT BE DTIME; Therapy: 09May2014 to (Evaluate:29Oct2014)  Requested  for: 13Jun2014; Last Rx:13Jun2014; Edited 3. URINE CULTURE  Requested for: 13Jun2014   Urine culture today and I will have him resume the bactrim and go to nightly suppression after a week as originally planned.  I will get him set up for cystoscopy and urethral dilation as an outpatient. I reviewed the risks of bleeding, infection, urethral injury and recurrent stricture, thrombotic events and anesthetic complications.   Discussion/Summary   CC: Dr. Margaret Simpson.  

## 2012-11-10 NOTE — Progress Notes (Signed)
Dr. Jayme Cloud consulted about preop management of diabetic pt. Pt may have 4-8 oz of apple juice or other pulpless juice if necessary to maintain glucose level. Pt will take 1/2 of Lantus as discussed on PAT visit.

## 2012-11-12 ENCOUNTER — Encounter (HOSPITAL_COMMUNITY)
Admission: RE | Disposition: A | Discharge status: Home / Self Care | Payer: Self-pay | Source: Ambulatory Visit | Attending: Urology

## 2012-11-12 ENCOUNTER — Encounter (HOSPITAL_COMMUNITY): Payer: Self-pay | Admitting: *Deleted

## 2012-11-12 ENCOUNTER — Ambulatory Visit (HOSPITAL_COMMUNITY)
Admission: RE | Admit: 2012-11-12 | Discharge: 2012-11-12 | Disposition: A | Discharge status: Home / Self Care | Payer: Medicare Other | Source: Ambulatory Visit | Attending: Urology | Admitting: Urology

## 2012-11-12 DIAGNOSIS — Z01812 Encounter for preprocedural laboratory examination: Secondary | ICD-10-CM | POA: Insufficient documentation

## 2012-11-12 DIAGNOSIS — N390 Urinary tract infection, site not specified: Secondary | ICD-10-CM | POA: Insufficient documentation

## 2012-11-12 DIAGNOSIS — N400 Enlarged prostate without lower urinary tract symptoms: Secondary | ICD-10-CM | POA: Insufficient documentation

## 2012-11-12 DIAGNOSIS — E119 Type 2 diabetes mellitus without complications: Secondary | ICD-10-CM | POA: Insufficient documentation

## 2012-11-12 DIAGNOSIS — Z01810 Encounter for preprocedural cardiovascular examination: Secondary | ICD-10-CM | POA: Insufficient documentation

## 2012-11-12 DIAGNOSIS — Z5309 Procedure and treatment not carried out because of other contraindication: Secondary | ICD-10-CM | POA: Insufficient documentation

## 2012-11-12 DIAGNOSIS — Z794 Long term (current) use of insulin: Secondary | ICD-10-CM | POA: Insufficient documentation

## 2012-11-12 SURGERY — CANCELLED PROCEDURE

## 2012-11-12 MED ORDER — LIDOCAINE HCL (PF) 1 % IJ SOLN
INTRAMUSCULAR | Status: AC
  Filled 2012-11-12: qty 5

## 2012-11-12 MED ORDER — PROPOFOL 10 MG/ML IV EMUL
INTRAVENOUS | Status: AC
  Filled 2012-11-12: qty 20

## 2012-11-12 MED ORDER — CIPROFLOXACIN IN D5W 400 MG/200ML IV SOLN: 400.0000 mg | INTRAVENOUS | Status: DC

## 2012-11-12 SURGICAL SUPPLY — 18 items
BAG DRAIN CYSTO-URO STER (DRAIN) ×3 IMPLANT
CATH FOLEY 2WAY SLVR  5CC 16FR (CATHETERS)
CATH FOLEY 2WAY SLVR 5CC 16FR (CATHETERS) IMPLANT
CLOTH BEACON ORANGE TIMEOUT ST (SAFETY) ×3 IMPLANT
ELECT REM PT RETURN 9FT ADLT (ELECTROSURGICAL) ×2
ELECTRODE REM PT RTRN 9FT ADLT (ELECTROSURGICAL) ×2 IMPLANT
GLOVE SURG SS PI 8.0 STRL IVOR (GLOVE) ×3 IMPLANT
GOWN PREVENTION PLUS LG XLONG (DISPOSABLE) ×3 IMPLANT
GOWN STRL REIN XL XLG (GOWN DISPOSABLE) ×3 IMPLANT
GUIDEWIRE STR DUAL SENSOR (WIRE) IMPLANT
NDL SAFETY ECLIPSE 18X1.5 (NEEDLE) IMPLANT
NEEDLE HYPO 18GX1.5 SHARP (NEEDLE)
NEEDLE HYPO 22GX1.5 SAFETY (NEEDLE) IMPLANT
NS IRRIG 500ML POUR BTL (IV SOLUTION) IMPLANT
PACK CYSTO (CUSTOM PROCEDURE TRAY) ×3 IMPLANT
SYR 20CC LL (SYRINGE) IMPLANT
SYR 30ML LL (SYRINGE) IMPLANT
WATER STERILE IRR 3000ML UROMA (IV SOLUTION) ×3 IMPLANT

## 2012-11-12 NOTE — Progress Notes (Signed)
Pt. Admitted to the pre-op area.  Pt. Verified name & procedure.  Pt. Stated that he had not taken BP medicines, thyroid medicine, or insulin since 11/10/12.  Pt. Was instructed to take norvasc, synthroid, lisiniopril, metoprolol, proair inhaler & pressair inhaler, pt. To take 1/2 of usual lantus dose on 11/11/12.  Pt. Was given a typed copy of pre-op instructions on 11/08/12.  Pt. Stated that he couldn't understand the instructions so he decided not to take any medications.  CBG obtained & was 193.  VS were HR 92, BP-154/92, O2 sat was 92% on RA.   Dr. Jayme Cloud notified.  Dr. Jayme Cloud spoke with Dr. Annabell Howells & informed Dr. Annabell Howells of situation.  Both MDs thought it would be best to reschedule pt. At a later time so pt. Would comply with pre-op instructions.  Pt. & wife informed & were instructed importance of being compliant with pre-op instructions. Pt. Instructed to continue taking Bactrim per Dr. Annabell Howells until surgery was performed.  Pt. & wife verbalized understanding of instructions.

## 2012-11-15 ENCOUNTER — Encounter: Payer: Self-pay | Admitting: Family Medicine

## 2012-11-22 ENCOUNTER — Encounter: Payer: Self-pay | Admitting: Family Medicine

## 2012-11-22 ENCOUNTER — Encounter (HOSPITAL_COMMUNITY)
Admission: RE | Admit: 2012-11-22 | Discharge: 2012-11-22 | Disposition: A | Discharge status: Home / Self Care | Payer: Medicare Other | Source: Ambulatory Visit | Admitting: Urology

## 2012-11-22 ENCOUNTER — Ambulatory Visit (INDEPENDENT_AMBULATORY_CARE_PROVIDER_SITE_OTHER): Payer: Medicare Other | Admitting: Family Medicine

## 2012-11-22 VITALS — BP 170/80 | HR 90 | Resp 16 | Wt 240.0 lb

## 2012-11-22 DIAGNOSIS — Z125 Encounter for screening for malignant neoplasm of prostate: Secondary | ICD-10-CM

## 2012-11-22 DIAGNOSIS — J449 Chronic obstructive pulmonary disease, unspecified: Secondary | ICD-10-CM | POA: Insufficient documentation

## 2012-11-22 DIAGNOSIS — E669 Obesity, unspecified: Secondary | ICD-10-CM

## 2012-11-22 DIAGNOSIS — IMO0002 Reserved for concepts with insufficient information to code with codable children: Secondary | ICD-10-CM

## 2012-11-22 DIAGNOSIS — N750 Cyst of Bartholin's gland: Secondary | ICD-10-CM | POA: Insufficient documentation

## 2012-11-22 DIAGNOSIS — E039 Hypothyroidism, unspecified: Secondary | ICD-10-CM

## 2012-11-22 DIAGNOSIS — E785 Hyperlipidemia, unspecified: Secondary | ICD-10-CM

## 2012-11-22 DIAGNOSIS — I10 Essential (primary) hypertension: Secondary | ICD-10-CM

## 2012-11-22 DIAGNOSIS — J4489 Other specified chronic obstructive pulmonary disease: Secondary | ICD-10-CM

## 2012-11-22 DIAGNOSIS — E1065 Type 1 diabetes mellitus with hyperglycemia: Secondary | ICD-10-CM

## 2012-11-22 MED ORDER — LISINOPRIL 40 MG PO TABS: 40.0000 mg | ORAL_TABLET | Freq: Every day | ORAL | Status: DC

## 2012-11-22 NOTE — Patient Instructions (Addendum)
F/u in 4.5 month, call if you need me before   Please discuss cost of  Insulin and medication for COPD with Dr Fransico Him and Dr Juanetta Gosling to see if you can get more affordable alternatives.  We will give you information re access to free medication  through the health dept if you qualify.  All the best with your procedure his Friday  PLEASE take all medications for blood pressure and heart daily, your blood pressure is too high today  Fasting lipid, cmp, PSA in 4.5 month

## 2012-11-23 ENCOUNTER — Encounter (HOSPITAL_COMMUNITY): Payer: Self-pay | Admitting: Pharmacy Technician

## 2012-11-26 ENCOUNTER — Encounter (HOSPITAL_COMMUNITY): Payer: Self-pay | Admitting: *Deleted

## 2012-11-26 ENCOUNTER — Ambulatory Visit (HOSPITAL_COMMUNITY)
Admission: RE | Admit: 2012-11-26 | Discharge: 2012-11-26 | Disposition: A | Discharge status: Home / Self Care | Payer: Medicare Other | Source: Ambulatory Visit | Attending: Urology | Admitting: Urology

## 2012-11-26 ENCOUNTER — Ambulatory Visit (HOSPITAL_COMMUNITY): Payer: Medicare Other | Admitting: Anesthesiology

## 2012-11-26 ENCOUNTER — Encounter (HOSPITAL_COMMUNITY): Payer: Self-pay | Admitting: Anesthesiology

## 2012-11-26 ENCOUNTER — Encounter (HOSPITAL_COMMUNITY)
Admission: RE | Disposition: A | Discharge status: Home / Self Care | Payer: Self-pay | Source: Ambulatory Visit | Attending: Urology

## 2012-11-26 DIAGNOSIS — N135 Crossing vessel and stricture of ureter without hydronephrosis: Secondary | ICD-10-CM | POA: Insufficient documentation

## 2012-11-26 DIAGNOSIS — E119 Type 2 diabetes mellitus without complications: Secondary | ICD-10-CM | POA: Insufficient documentation

## 2012-11-26 DIAGNOSIS — Z01812 Encounter for preprocedural laboratory examination: Secondary | ICD-10-CM | POA: Insufficient documentation

## 2012-11-26 DIAGNOSIS — N390 Urinary tract infection, site not specified: Secondary | ICD-10-CM | POA: Insufficient documentation

## 2012-11-26 DIAGNOSIS — I10 Essential (primary) hypertension: Secondary | ICD-10-CM | POA: Insufficient documentation

## 2012-11-26 HISTORY — PX: CYSTOSCOPY WITH URETHRAL DILATATION: SHX5125

## 2012-11-26 LAB — GLUCOSE, CAPILLARY
Glucose-Capillary: 282 mg/dL — ABNORMAL HIGH (ref 70–99)
Glucose-Capillary: 247 mg/dL — ABNORMAL HIGH (ref 70–99)

## 2012-11-26 SURGERY — CYSTOSCOPY, WITH URETHRAL DILATION
Anesthesia: General | Site: Urethra | Wound class: Clean Contaminated

## 2012-11-26 MED ORDER — STERILE WATER FOR IRRIGATION IR SOLN
Status: DC | PRN
  Administered 2012-11-26: 1000 mL

## 2012-11-26 MED ORDER — PROPOFOL 10 MG/ML IV BOLUS
INTRAVENOUS | Status: DC | PRN
  Administered 2012-11-26: 20 mg via INTRAVENOUS
  Administered 2012-11-26: 150 mg via INTRAVENOUS
  Administered 2012-11-26: 30 mg via INTRAVENOUS

## 2012-11-26 MED ORDER — LACTATED RINGERS IV SOLN
INTRAVENOUS | Status: DC
  Administered 2012-11-26: 1000 mL via INTRAVENOUS

## 2012-11-26 MED ORDER — MIDAZOLAM HCL 2 MG/2ML IJ SOLN
INTRAMUSCULAR | Status: AC
  Filled 2012-11-26: qty 2

## 2012-11-26 MED ORDER — CIPROFLOXACIN IN D5W 400 MG/200ML IV SOLN
400.0000 mg | Freq: Once | INTRAVENOUS | Status: AC
  Administered 2012-11-26: 400 mg via INTRAVENOUS

## 2012-11-26 MED ORDER — FENTANYL CITRATE 0.05 MG/ML IJ SOLN
INTRAMUSCULAR | Status: DC | PRN
  Administered 2012-11-26: 25 ug via INTRAVENOUS

## 2012-11-26 MED ORDER — FENTANYL CITRATE 0.05 MG/ML IJ SOLN: 25.0000 ug | INTRAMUSCULAR | Status: DC | PRN

## 2012-11-26 MED ORDER — MIDAZOLAM HCL 2 MG/2ML IJ SOLN
1.0000 mg | INTRAMUSCULAR | Status: DC | PRN
  Administered 2012-11-26: 2 mg via INTRAVENOUS

## 2012-11-26 MED ORDER — ONDANSETRON HCL 4 MG/2ML IJ SOLN: 4.0000 mg | Freq: Once | INTRAMUSCULAR | Status: AC | PRN

## 2012-11-26 MED ORDER — ONDANSETRON HCL 4 MG/2ML IJ SOLN
INTRAMUSCULAR | Status: AC
  Filled 2012-11-26: qty 2

## 2012-11-26 MED ORDER — CIPROFLOXACIN IN D5W 400 MG/200ML IV SOLN
INTRAVENOUS | Status: AC
  Filled 2012-11-26: qty 200

## 2012-11-26 MED ORDER — SODIUM CHLORIDE 0.9 % IR SOLN
Status: DC | PRN
  Administered 2012-11-26: 3000 mL via INTRAVESICAL

## 2012-11-26 MED ORDER — ONDANSETRON HCL 4 MG/2ML IJ SOLN
4.0000 mg | Freq: Once | INTRAMUSCULAR | Status: AC
  Administered 2012-11-26: 4 mg via INTRAVENOUS

## 2012-11-26 MED ORDER — FENTANYL CITRATE 0.05 MG/ML IJ SOLN
INTRAMUSCULAR | Status: AC
  Filled 2012-11-26: qty 2

## 2012-11-26 MED ORDER — LIDOCAINE HCL (PF) 1 % IJ SOLN
INTRAMUSCULAR | Status: AC
  Filled 2012-11-26: qty 5

## 2012-11-26 MED ORDER — PHENAZOPYRIDINE HCL 200 MG PO TABS: 200.0000 mg | ORAL_TABLET | Freq: Three times a day (TID) | ORAL | Status: DC | PRN

## 2012-11-26 MED ORDER — HYDROCODONE-ACETAMINOPHEN 5-325 MG PO TABS: 1.0000 | ORAL_TABLET | Freq: Four times a day (QID) | ORAL | Status: DC | PRN

## 2012-11-26 MED ORDER — PROPOFOL 10 MG/ML IV EMUL
INTRAVENOUS | Status: AC
  Filled 2012-11-26: qty 20

## 2012-11-26 SURGICAL SUPPLY — 24 items
BAG DRAIN CYSTO-URO STER (DRAIN) ×1 IMPLANT
CATH FOLEY 2WAY SLVR  5CC 16FR (CATHETERS)
CATH FOLEY 2WAY SLVR 5CC 16FR (CATHETERS) IMPLANT
CLOTH BEACON ORANGE TIMEOUT ST (SAFETY) ×2 IMPLANT
ELECT REM PT RETURN 9FT ADLT (ELECTROSURGICAL)
ELECTRODE REM PT RTRN 9FT ADLT (ELECTROSURGICAL) ×1 IMPLANT
GLOVE BIOGEL PI IND STRL 7.0 (GLOVE) IMPLANT
GLOVE BIOGEL PI INDICATOR 7.0 (GLOVE) ×2
GLOVE SS BIOGEL STRL SZ 6.5 (GLOVE) IMPLANT
GLOVE SUPERSENSE BIOGEL SZ 6.5 (GLOVE) ×1
GLOVE SURG SS PI 8.0 STRL IVOR (GLOVE) ×2 IMPLANT
GOWN PREVENTION PLUS LG XLONG (DISPOSABLE) ×2 IMPLANT
GOWN STRL REIN XL XLG (GOWN DISPOSABLE) ×2 IMPLANT
GUIDEWIRE STR DUAL SENSOR (WIRE) ×1 IMPLANT
IV NS IRRIG 3000ML ARTHROMATIC (IV SOLUTION) ×1 IMPLANT
NDL SAFETY ECLIPSE 18X1.5 (NEEDLE) IMPLANT
NEEDLE HYPO 18GX1.5 SHARP (NEEDLE)
NEEDLE HYPO 22GX1.5 SAFETY (NEEDLE) IMPLANT
NS IRRIG 500ML POUR BTL (IV SOLUTION) IMPLANT
PACK CYSTO (CUSTOM PROCEDURE TRAY) ×2 IMPLANT
SYR 20CC LL (SYRINGE) IMPLANT
SYR 30ML LL (SYRINGE) IMPLANT
WATER STERILE IRR 1000ML POUR (IV SOLUTION) ×1 IMPLANT
WATER STERILE IRR 3000ML UROMA (IV SOLUTION) ×1 IMPLANT

## 2012-11-26 NOTE — Interval H&P Note (Signed)
History and Physical Interval Note:  11/26/2012 12:00 PM  [name]  has presented today for surgery, with the diagnosis of urethral stricture with uti's  The various methods of treatment have been discussed with the patient and family. After consideration of risks, benefits and other options for treatment, the patient has consented to  Procedure(s): CYSTOSCOPY WITH URETHRAL DILATATION (N/A) as a surgical intervention .  The patient's history has been reviewed, patient examined, no change in status, stable for surgery.  I have reviewed the patient's chart and labs.  Questions were answered to the patient's satisfaction.     Zaccheus Edmister J

## 2012-11-26 NOTE — Brief Op Note (Signed)
11/26/2012  12:41 PM  PATIENT:  [name]  72 y.o. male  PRE-OPERATIVE DIAGNOSIS:  urethral stricture with uti's  POST-OPERATIVE DIAGNOSIS:  urethral stricture with uti's  PROCEDURE:  Procedure(s): CYSTOSCOPY WITH URETHRAL DILATATION (N/A)  SURGEON:  Surgeon(s) and Role:    * Anner Crete, MD - Primary  PHYSICIAN ASSISTANT:   ASSISTANTS: none   ANESTHESIA:   general  EBL:  Total I/O In: 300 [I.V.:300] Out: 0   BLOOD ADMINISTERED:none  DRAINS: none   LOCAL MEDICATIONS USED:  NONE  SPECIMEN:  No Specimen  DISPOSITION OF SPECIMEN:  N/A  COUNTS:  YES  TOURNIQUET:  * No tourniquets in log *  DICTATION: .Other Dictation: Dictation Number [id number]  PLAN OF CARE: Discharge to home after PACU  PATIENT DISPOSITION:  PACU - hemodynamically stable.   Delay start of Pharmacological VTE agent (>24hrs) due to surgical blood loss or risk of bleeding: not applicable

## 2012-11-26 NOTE — Anesthesia Procedure Notes (Signed)
Procedure Name: LMA Insertion Date/Time: 11/26/2012 12:21 PM Performed by: Franco Nones Pre-anesthesia Checklist: Patient identified, Patient being monitored, Emergency Drugs available, Timeout performed and Suction available Patient Re-evaluated:Patient Re-evaluated prior to inductionOxygen Delivery Method: Circle System Utilized Preoxygenation: Pre-oxygenation with 100% oxygen Intubation Type: IV induction Ventilation: Mask ventilation without difficulty LMA: LMA inserted LMA Size: 4.0 Number of attempts: 1 Placement Confirmation: positive ETCO2 and breath sounds checked- equal and bilateral

## 2012-11-26 NOTE — H&P (View-Only) (Signed)
roblems 1. Benign Prostatic Hypertrophy 600.00 2. Urinary Tract Infection 599.0  History of Present Illness  Mr. Manetta returns today in f/u.   He has a history of BPH with BOO and recurrent UTI's and returns for further evaluation.   He has nocturia x 3 starting at 4 am. He has no dysuria or hematuria.  He doesn't have any trouble during the day.   He completed his sulfa but didn't stay on it for suppression as suggested.   His culture was positive and sensitive to the bactrim.   He has some urgency.   His PVR today is 31cc and his PF is 14cc/sec.   Past Medical History 1. History of  Acute Myocardial Infarction V12.59 2. History of  Arthritis V13.4 3. History of  Diabetes Mellitus 250.00 4. History of  Gout 274.9 5. History of  Hypercholesterolemia 272.0 6. History of  Hypertension 401.9  Surgical History 1. History of  Appendectomy 2. History of  Cath Stent Placement  Current Meds 1. Allopurinol 300 MG Oral Tablet; Therapy: (Recorded:09May2014) to 2. AmLODIPine Besylate 10 MG Oral Tablet; Therapy: (Recorded:09May2014) to 3. Aspirin 325 MG Oral Tablet; Therapy: (Recorded:09May2014) to 4. Benicar HCT TABS; Therapy: (Recorded:09May2014) to 5. Clotrimazole-Betamethasone 1-0.05 % External Cream; Therapy: (Recorded:09May2014) to 6. Glucophage 500 MG Oral Tablet; Therapy: 09May2014 to 7. Isosorbide Mononitrate ER 60 MG Oral Tablet Extended Release 24 Hour; Therapy:  (Recorded:09May2014) to 8. Metoprolol Tartrate 100 MG Oral Tablet; Therapy: (Recorded:09May2014) to 9. Oxygen Use; Therapy: 09May2014 to 10. Pravastatin Sodium 80 MG Oral Tablet; Therapy: (Recorded:09May2014) to 11. Sulfamethoxazole-TMP DS 800-160 MG Oral Tablet; take 1 tablet by mouth twice a day for 7 days   THEN TAKE 1 TABLET AT BE DTIME; Therapy: 09May2014 to (Evaluate:17Jul2014)  Requested   for: 09May2014; Last Rx:09May2014 12. Trilipix 135 MG Oral Capsule Delayed Release; Therapy: (Recorded:09May2014) to 13.  Tudorza Pressair AEPB; Therapy: (Recorded:13Jun2014) to  Allergies 1. No Known Drug Allergies  Family History 1. Family history of  Death In The Family Father 2. Family history of  Death In The Family Mother 3. Family history of  Diabetes Mellitus V18.0 4. Family history of  Family Health Status Number Of Children  Social History 1. Former Smoker V15.82 1/2 ppd for 30 year. Quit 19 years ago. 2. Marital History - Currently Married 3. Never Drank Alcohol 4. Occupation: Retired  Review of Systems  Gastrointestinal: no flank pain.  Constitutional: no fever.    Vitals Vital Signs [Data Includes: Last 1 Day]  13Jun2014 09:28AM  Blood Pressure: 149 / 74 Temperature: 98.1 F Heart Rate: 69  Physical Exam Constitutional: Well nourished and well developed . No acute distress.  Pulmonary: No respiratory distress and normal respiratory rhythm and effort.  Cardiovascular: Heart rate and rhythm are normal . No peripheral edema.    Results/Data Urine [Data Includes: Last 1 Day]   13Jun2014  COLOR YELLOW   APPEARANCE CLOUDY   SPECIFIC GRAVITY 1.025   pH 6.0   GLUCOSE 250 mg/dL  BILIRUBIN NEG   KETONE NEG mg/dL  BLOOD TRACE   PROTEIN TRACE mg/dL  UROBILINOGEN 0.2 mg/dL  NITRITE NEG   LEUKOCYTE ESTERASE SMALL   SQUAMOUS EPITHELIAL/HPF RARE   WBC 11-20 WBC/hpf  RBC 0-2 RBC/hpf  BACTERIA MANY   CRYSTALS NONE SEEN   CASTS NONE SEEN    Flow Rate: Voided 214 ml. A peak flow rate of 57ml/s, mean flow rate of 30ml/s and normal flow curve .  PVR: Ultrasound PVR 31 ml.  Procedure  Procedure: Cystoscopy   Indication: Frequent UTI.  Prep: The patient was prepped with betadine.  Antibiotic prophylaxis: Ciprofloxacin.  Procedure Note:  Urethral meatus:. No abnormalities.  Anterior urethra:. A moderate stricture was present (with a tight area in the distal bulb that wouldn't allow passage of the scope. ) involving the entire urethra but was not manipulated.  Prostatic  urethra:. Unable to access.  Bladder: Unable to access because of the stricture. The patient tolerated the procedure well.  Complications: None.    Assessment 1. Urinary Tract Infection 599.0 2. Ureteral Stricture 593.3   He has recurrent UTI's with urethral stricture disease.   Plan Benign Prostatic Hypertrophy (600.00)  1. UA With REFLEX  Requested for: 10Jun2014 Urinary Tract Infection (599.0)  2. Sulfamethoxazole-TMP DS 800-160 MG Oral Tablet; take 1 tablet by mouth twice a day for 7 days  THEN TAKE 1 TABLET AT BE DTIME; Therapy: 09May2014 to (Evaluate:29Oct2014)  Requested  for: 13Jun2014; Last Rx:13Jun2014; Edited 3. URINE CULTURE  Requested for: 13Jun2014   Urine culture today and I will have him resume the bactrim and go to nightly suppression after a week as originally planned.  I will get him set up for cystoscopy and urethral dilation as an outpatient. I reviewed the risks of bleeding, infection, urethral injury and recurrent stricture, thrombotic events and anesthetic complications.   Discussion/Summary   CC: Dr. Syliva Overman.

## 2012-11-26 NOTE — Anesthesia Postprocedure Evaluation (Signed)
  Anesthesia Post-op Note  Patient: [name]  Procedure(s) Performed: Procedure(s): CYSTOSCOPY WITH URETHRAL DILATATION (N/A)  Patient Location: PACU  Anesthesia Type:General  Level of Consciousness: sedated  Airway and Oxygen Therapy: Patient Spontanous Breathing and non-rebreather face mask  Post-op Pain: none  Post-op Assessment: Post-op Vital signs reviewed, Patient's Cardiovascular Status Stable, Respiratory Function Stable and Patent Airway  Post-op Vital Signs: Reviewed and stable blood glucose 247  Complications: No apparent anesthesia complications

## 2012-11-26 NOTE — Anesthesia Preprocedure Evaluation (Addendum)
Anesthesia Evaluation  Patient identified by MRN, date of birth, ID band Patient awake    Reviewed: Allergy & Precautions, H&P , NPO status , Patient's Chart, lab work & pertinent test results  Airway Mallampati: I TM Distance: >3 FB     Dental  (+) Edentulous Upper   Pulmonary shortness of breath and with exertion, COPD oxygen dependent,  breath sounds clear to auscultation        Cardiovascular hypertension, Pt. on medications + CAD, + Cardiac Stents and + DOE Rhythm:Regular Rate:Normal     Neuro/Psych    GI/Hepatic GERD- (inactive)  Controlled,  Endo/Other  diabetes, Poorly Controlled, Type 2, Insulin Dependent and Oral Hypoglycemic AgentsHypothyroidism   Renal/GU      Musculoskeletal   Abdominal   Peds  Hematology   Anesthesia Other Findings   Reproductive/Obstetrics                          Anesthesia Physical Anesthesia Plan  ASA: III  Anesthesia Plan: General   Post-op Pain Management:    Induction: Intravenous  Airway Management Planned: LMA  Additional Equipment:   Intra-op Plan:   Post-operative Plan: Extubation in OR  Informed Consent: I have reviewed the patients History and Physical, chart, labs and discussed the procedure including the risks, benefits and alternatives for the proposed anesthesia with the patient or authorized representative who has indicated his/her understanding and acceptance.     Plan Discussed with:   Anesthesia Plan Comments:         Anesthesia Quick Evaluation

## 2012-11-26 NOTE — Transfer of Care (Signed)
Immediate Anesthesia Transfer of Care Note  Patient: [name]  Procedure(s) Performed: Procedure(s) (LRB): CYSTOSCOPY WITH URETHRAL DILATATION (N/A)  Patient Location: PACU  Anesthesia Type: General  Level of Consciousness: awake  Airway & Oxygen Therapy: Patient Spontanous Breathing and non-rebreather face mask  Post-op Assessment: Report given to PACU RN, Post -op Vital signs reviewed and stable and Patient moving all extremities  Post vital signs: Reviewed and stable  Complications: No apparent anesthesia complications

## 2012-11-26 NOTE — Progress Notes (Signed)
No drainage from meatus.

## 2012-11-26 NOTE — Op Note (Signed)
NAMEMASSIMILIANO, ROHLEDER              ACCOUNT NO.:  [id number]  MEDICAL RECORD NO.:  [id number]  LOCATION:  [facility]                          FACILITY:  [facility]  PHYSICIAN:  Excell Seltzer. Annabell Howells, M.D.    DATE OF BIRTH:  [birth year]  DATE OF PROCEDURE:  11/26/2012 DATE OF DISCHARGE:                              OPERATIVE REPORT   PROCEDURE:  Cystoscopy with urethral dilation.  PREOPERATIVE DIAGNOSIS:  Bulbar urethral stricture with recurrent urinary tract infections.  POSTOPERATIVE DIAGNOSIS:  Bulbar urethral stricture with recurrent urinary tract infections.  SURGEON:  Excell Seltzer. Annabell Howells, MD  ANESTHESIA:  General.  SPECIMEN:  None.  DRAINS:  None.  ESTIMATED BLOOD LOSS:  None.  COMPLICATIONS:  None.  INDICATIONS:  Mr. Douthit is a 72 year old African American male with a history of recurrent urinary tract infections, who was found to have bulbar stricture on office cystoscopy.  It was felt that dilation under anesthesia was indicated.  FINDINGS OF PROCEDURE:  He had been on ampicillin for suppression and was given Cipro for additional coverage.  He was taken to the operating room where general anesthetic was induced.  He was placed in lithotomy position and fitted with PAS hose.  His perineum and genitalia were prepped with Betadine solution.  He was draped in usual sterile fashion.  Cystoscopy was performed using a 22-French scope with 12 and 70 degree lens.  Examination revealed a normal anterior urethra.  There was a stricture in the bulb which appeared to be short in length, approximately 12-French in diameter.  A sensor guidewire was passed through the stricture and the cystoscope was passed along over the wire. I was able to disrupt the initial stricture as it was rather short, however, there was more proximal stricturing that was not quite as severe.  The scope could be advanced into the bladder.  The prostatic urethra was approximately 2 cm in length with some bilobar  hyperplasia with obstruction and a high median bar.  Examination of bladder revealed moderate severe trabeculation with no tumors, stones, or inflammation. Ureteral orifices were unremarkable.  Once cystoscopy was performed, the guidewire was advanced all the way into the bladder and the scope was removed.  The urethra was then dilated to 30-French with Geraldo Pitter sounds.  The sound and wire were then removed and the cystoscope was reinserted.  Inspection revealed disruption of the stricture in the bulb with no significant bleeding.  The bladder was then drained.  It was felt the catheter was not indicated.  He was taken down from lithotomy position.  His anesthetic was reversed.  He was moved to the recovery room in stable condition.  There were no complications.     Excell Seltzer. Annabell Howells, M.D.     JJW/MEDQ  D:  11/26/2012  T:  11/26/2012  Job:  [id number]

## 2012-11-29 ENCOUNTER — Encounter (HOSPITAL_COMMUNITY): Payer: Self-pay | Admitting: Urology

## 2012-12-03 ENCOUNTER — Ambulatory Visit (INDEPENDENT_AMBULATORY_CARE_PROVIDER_SITE_OTHER): Payer: Medicare Other | Admitting: Urology

## 2012-12-03 DIAGNOSIS — IMO0002 Reserved for concepts with insufficient information to code with codable children: Secondary | ICD-10-CM

## 2012-12-03 DIAGNOSIS — N390 Urinary tract infection, site not specified: Secondary | ICD-10-CM

## 2012-12-29 ENCOUNTER — Telehealth: Payer: Self-pay | Admitting: Family Medicine

## 2012-12-29 DIAGNOSIS — E785 Hyperlipidemia, unspecified: Secondary | ICD-10-CM

## 2012-12-29 NOTE — Assessment & Plan Note (Signed)
Uncontrolled, sttaes he has not taken mwdication on day of visit. Discussed importance of med compliance all the time DASH diet and commitment to daily physical activity for a minimum of 30 minutes discussed and encouraged, as a part of hypertension management. The importance of attaining a healthy weight is also discussed.

## 2012-12-29 NOTE — Assessment & Plan Note (Signed)
Improving under the care of endo. Patient advised to reduce carb and sweets, commit to regular physical activity, take meds as prescribed, test blood as directed, and attempt to lose weight, to improve blood sugar control.

## 2012-12-29 NOTE — Progress Notes (Signed)
  Subjective:    Patient ID: [name], male    DOB: June 08, 1940, 72 y.o.   MRN: [id number]  HPI The PT is here for follow up and re-evaluation of chronic medical conditions, medication management and review of any available recent lab and radiology data.  Preventive health is updated, specifically  Cancer screening and Immunization. Needs eye exam and foot exam  Has upcoming procedure by urology due to stenosed urethra resulting in UTI. He will continue close f/u with endocrine, his blood sugar is improving Has benefited from recent pulmonary evaluation, states breathing is better with his inhalers. The PT denies any adverse reactions to current medications since the last visit. Concerned about medication cost, espescialy of his insulin  There are no specific complaints       Review of Systems See HPI Denies recent fever or chills. Denies sinus pressure, nasal congestion, ear pain or sore throat. Denies chest congestion or  productive cough. Denies chest pains, palpitations and leg swelling Denies abdominal pain, nausea, vomiting,diarrhea or constipation.   Denies dysuria, frequency, hesitancy or incontinence. Chronic joint pain, no limitation in mobility. Denies headaches, seizures, numbness, or tingling. Denies depression, anxiety or insomnia. Chronic rash from psoriasis , unchanged       Objective:   Physical Exam  Patient alert and oriented and in no cardiopulmonary distress.  HEENT: No facial asymmetry, EOMI, no sinus tenderness,  oropharynx pink and moist.  Neck supple no adenopathy.  Chest: Clear, decreased though adequate air enntry  CVS: S1, S2 no murmurs, no S3.  ABD: Soft non tender. Bowel sounds normal.  Ext: No edema  MS: Adequate though reduced  ROM spine, shoulders, hips and knees.  Skin: Intact, psoriatic rash on knees  Psych: Good eye contact, normal affect. Memory intact not anxious or depressed appearing.  CNS: CN 2-12 intact, power, tone  and sensation normal throughout.       Assessment & Plan:

## 2012-12-29 NOTE — Assessment & Plan Note (Signed)
Unchanged. Patient re-educated about  the importance of commitment to a  minimum of 150 minutes of exercise per week. The importance of healthy food choices with portion control discussed. Encouraged to start a food diary, count calories and to consider  joining a support group. Sample diet sheets offered. Goals set by the patient for the next several months.    

## 2012-12-29 NOTE — Telephone Encounter (Signed)
Pls check pt's pharmacy and see if he is on any statin for cholesterol. None on his med list. If none current document from pharmacy most recent statin and the dose he was taking  And when pls Pt needs hepatic panel and microalb asap, non fasting.He ABSOLUTELY needs to be on a statin he has had  CABG He will still need the fasting labs for his next visit  Pls send me a response with the info requested

## 2012-12-29 NOTE — Assessment & Plan Note (Signed)
Managed by endo, updated lab scanned in, adequate control on current med

## 2012-12-29 NOTE — Assessment & Plan Note (Signed)
Recently evaluated by pulmonary, reports improvement in breathing on current meds

## 2012-12-29 NOTE — Assessment & Plan Note (Signed)
Hyperlipidemia:Low fat diet discussed and encouraged. Need updated lab also need to establish what statin pt is taking

## 2012-12-31 LAB — HEPATIC FUNCTION PANEL
ALT: 19 U/L (ref 0–53)
Alkaline Phosphatase: 70 U/L (ref 39–117)
Indirect Bilirubin: 0.3 mg/dL (ref 0.0–0.9)
Total Protein: 6.7 g/dL (ref 6.0–8.3)

## 2012-12-31 NOTE — Addendum Note (Signed)
Addended by: Kandis Fantasia B on: 12/31/2012 11:02 AM   Modules accepted: Orders

## 2012-12-31 NOTE — Telephone Encounter (Signed)
According to pharmacy.  No statin therapy noted.  And none noted on file for more than 2 years.

## 2012-12-31 NOTE — Telephone Encounter (Signed)
Spoke with patient and he will have labs drawn today.

## 2012-12-31 NOTE — Telephone Encounter (Signed)
Noted, thanks!

## 2013-01-01 ENCOUNTER — Other Ambulatory Visit: Payer: Self-pay | Admitting: Family Medicine

## 2013-01-01 LAB — MICROALBUMIN / CREATININE URINE RATIO: Microalb Creat Ratio: 38.4 mg/g — ABNORMAL HIGH (ref 0.0–30.0)

## 2013-01-28 ENCOUNTER — Ambulatory Visit (INDEPENDENT_AMBULATORY_CARE_PROVIDER_SITE_OTHER): Payer: Medicare Other | Admitting: Urology

## 2013-01-28 DIAGNOSIS — IMO0002 Reserved for concepts with insufficient information to code with codable children: Secondary | ICD-10-CM

## 2013-01-28 DIAGNOSIS — Z87440 Personal history of urinary (tract) infections: Secondary | ICD-10-CM

## 2013-03-24 ENCOUNTER — Other Ambulatory Visit: Payer: Self-pay

## 2013-03-24 DIAGNOSIS — I10 Essential (primary) hypertension: Secondary | ICD-10-CM

## 2013-03-24 DIAGNOSIS — E785 Hyperlipidemia, unspecified: Secondary | ICD-10-CM

## 2013-03-24 MED ORDER — PRAVASTATIN SODIUM 80 MG PO TABS: 80.0000 mg | ORAL_TABLET | Freq: Every evening | ORAL | Status: DC

## 2013-04-20 ENCOUNTER — Ambulatory Visit (INDEPENDENT_AMBULATORY_CARE_PROVIDER_SITE_OTHER): Payer: Medicare Other | Admitting: Family Medicine

## 2013-04-20 ENCOUNTER — Encounter: Payer: Self-pay | Admitting: Family Medicine

## 2013-04-20 ENCOUNTER — Encounter (INDEPENDENT_AMBULATORY_CARE_PROVIDER_SITE_OTHER): Payer: Self-pay

## 2013-04-20 ENCOUNTER — Other Ambulatory Visit: Payer: Self-pay | Admitting: Family Medicine

## 2013-04-20 VITALS — BP 160/84 | HR 78 | Resp 16 | Ht 71.0 in | Wt 238.1 lb

## 2013-04-20 DIAGNOSIS — M25559 Pain in unspecified hip: Secondary | ICD-10-CM

## 2013-04-20 DIAGNOSIS — L408 Other psoriasis: Secondary | ICD-10-CM

## 2013-04-20 DIAGNOSIS — N058 Unspecified nephritic syndrome with other morphologic changes: Secondary | ICD-10-CM

## 2013-04-20 DIAGNOSIS — E1065 Type 1 diabetes mellitus with hyperglycemia: Secondary | ICD-10-CM

## 2013-04-20 DIAGNOSIS — E1129 Type 2 diabetes mellitus with other diabetic kidney complication: Secondary | ICD-10-CM

## 2013-04-20 DIAGNOSIS — IMO0001 Reserved for inherently not codable concepts without codable children: Secondary | ICD-10-CM

## 2013-04-20 DIAGNOSIS — E1121 Type 2 diabetes mellitus with diabetic nephropathy: Secondary | ICD-10-CM

## 2013-04-20 DIAGNOSIS — I10 Essential (primary) hypertension: Secondary | ICD-10-CM

## 2013-04-20 DIAGNOSIS — E785 Hyperlipidemia, unspecified: Secondary | ICD-10-CM

## 2013-04-20 DIAGNOSIS — E039 Hypothyroidism, unspecified: Secondary | ICD-10-CM

## 2013-04-20 DIAGNOSIS — B351 Tinea unguium: Secondary | ICD-10-CM

## 2013-04-20 LAB — COMPLETE METABOLIC PANEL WITH GFR
Alkaline Phosphatase: 106 U/L (ref 39–117)
GFR, Est Non African American: 64 mL/min
Glucose, Bld: 327 mg/dL — ABNORMAL HIGH (ref 70–99)
Sodium: 134 mEq/L — ABNORMAL LOW (ref 135–145)
Total Bilirubin: 0.4 mg/dL (ref 0.3–1.2)
Total Protein: 7 g/dL (ref 6.0–8.3)

## 2013-04-20 LAB — LIPID PANEL
HDL: 23 mg/dL — ABNORMAL LOW (ref >39)
Total CHOL/HDL Ratio: 5.7 Ratio

## 2013-04-20 LAB — TSH: TSH: 2.003 u[IU]/mL (ref 0.350–4.500)

## 2013-04-20 MED ORDER — LISINOPRIL 40 MG PO TABS: 40.0000 mg | ORAL_TABLET | Freq: Every day | ORAL | Status: DC

## 2013-04-20 MED ORDER — AMLODIPINE BESYLATE 5 MG PO TABS: 5.0000 mg | ORAL_TABLET | Freq: Every morning | ORAL | Status: DC

## 2013-04-20 NOTE — Progress Notes (Signed)
   Subjective:    Patient ID: [name], male    DOB: 1941/01/26, 72 y.o.   MRN: [id number]  HPI The PT is here for follow up and re-evaluation of chronic medical conditions, medication management and review of any available recent lab and radiology data.  Preventive health is updated, specifically  Cancer screening and Immunization.  Needs eye exam  The PT denies any adverse reactions to current medications since the last visit.  States that despite taking his meds, his blood sugar fluctuates a lot, but in his opinion it is doing OK. C/o increased hip pain and instability, also flare up of rash on legs , requests derm eval and treatment Denies chest pain or exertional fatigue, has npot seen cardiologist for several years but wants to hold off on eval at this tme    Review of Systems See HPI Denies recent fever or chills. Denies sinus pressure, nasal congestion, ear pain or sore throat. Denies chest congestion, productive cough or wheezing. Denies chest pains, palpitations and leg swelling Denies abdominal pain, nausea, vomiting,diarrhea or constipation.   Denies dysuria, frequency, hesitancy or incontinence.  Denies headaches, seizures, numbness, or tingling. Denies depression, anxiety or insomnia.       Objective:   Physical Exam  Patient alert and oriented and in no cardiopulmonary distress.  HEENT: No facial asymmetry, EOMI, no sinus tenderness,  oropharynx pink and moist.  Neck supple no adenopathy.  Chest: Clear to auscultation bilaterally.  CVS: S1, S2 no murmurs, no S3.  ABD: Soft non tender. Bowel sounds normal.  Ext: No edema  MS: Decreased  ROM spine,  hips and knees.  Skin: plaque on anterior legs , no purulent drainage, no open skin  Psych: Good eye contact, normal affect. Memory mildly impaired, not anxious or depressed appearing.  CNS: CN 2-12 intact, power, tone and sensation normal throughout.       Assessment & Plan:

## 2013-04-20 NOTE — Patient Instructions (Signed)
F/u in 4 month, call if you need me before  You are referred to Dr Hilda Lias for hips, Taffeine for skin and Dr Nile Riggs for eyes.  Amlodipine will be sent to your current pharmacy, you need this, blood pressure is high  Labs today will be sent to Dr Fransico Him also lipid, cmp and EGFr, hBaC and tSH  If liver test is good you will take tablet for 3 months for infection in toenails  It is important that you exercise regularly at least 30 minutes 5 times a week. If you develop chest pain, have severe difficulty breathing, or feel very tired, stop exercising immediately and seek medical attention   A healthy diet is rich in fruit, vegetables and whole grains. Poultry fish, nuts and beans are a healthy choice for protein rather then red meat. A low sodium diet and drinking 64 ounces of water daily is generally recommended. Oils and sweet should be limited. Carbohydrates especially for those who are diabetic or overweight, should be limited to 34-45 gram per meal. It is important to eat on a regular schedule, at least 3 times daily. Snacks should be primarily fruits, vegetables or nuts.

## 2013-04-21 LAB — HEMOGLOBIN A1C: Mean Plasma Glucose: 275 mg/dL — ABNORMAL HIGH (ref <117)

## 2013-04-22 LAB — LDL CHOLESTEROL, DIRECT: Direct LDL: 54 mg/dL

## 2013-04-22 MED ORDER — EZETIMIBE 10 MG PO TABS: 10.0000 mg | ORAL_TABLET | Freq: Every day | ORAL | Status: DC

## 2013-04-29 ENCOUNTER — Ambulatory Visit: Payer: Medicare Other | Admitting: Urology

## 2013-05-09 ENCOUNTER — Emergency Department (HOSPITAL_COMMUNITY): Payer: Medicare Other

## 2013-05-09 ENCOUNTER — Emergency Department (HOSPITAL_COMMUNITY)
Admission: EM | Admit: 2013-05-09 | Discharge: 2013-05-09 | Disposition: A | Discharge status: Home / Self Care | Payer: Medicare Other | Attending: Emergency Medicine | Admitting: Emergency Medicine

## 2013-05-09 ENCOUNTER — Encounter (HOSPITAL_COMMUNITY): Payer: Self-pay | Admitting: Emergency Medicine

## 2013-05-09 DIAGNOSIS — I2510 Atherosclerotic heart disease of native coronary artery without angina pectoris: Secondary | ICD-10-CM | POA: Insufficient documentation

## 2013-05-09 DIAGNOSIS — Z8719 Personal history of other diseases of the digestive system: Secondary | ICD-10-CM | POA: Insufficient documentation

## 2013-05-09 DIAGNOSIS — I252 Old myocardial infarction: Secondary | ICD-10-CM | POA: Insufficient documentation

## 2013-05-09 DIAGNOSIS — E669 Obesity, unspecified: Secondary | ICD-10-CM | POA: Insufficient documentation

## 2013-05-09 DIAGNOSIS — M109 Gout, unspecified: Secondary | ICD-10-CM | POA: Insufficient documentation

## 2013-05-09 DIAGNOSIS — Z95818 Presence of other cardiac implants and grafts: Secondary | ICD-10-CM | POA: Insufficient documentation

## 2013-05-09 DIAGNOSIS — R609 Edema, unspecified: Secondary | ICD-10-CM | POA: Insufficient documentation

## 2013-05-09 DIAGNOSIS — M1710 Unilateral primary osteoarthritis, unspecified knee: Secondary | ICD-10-CM | POA: Insufficient documentation

## 2013-05-09 DIAGNOSIS — Z87891 Personal history of nicotine dependence: Secondary | ICD-10-CM | POA: Insufficient documentation

## 2013-05-09 DIAGNOSIS — G8929 Other chronic pain: Secondary | ICD-10-CM | POA: Insufficient documentation

## 2013-05-09 DIAGNOSIS — E109 Type 1 diabetes mellitus without complications: Secondary | ICD-10-CM | POA: Insufficient documentation

## 2013-05-09 DIAGNOSIS — Z872 Personal history of diseases of the skin and subcutaneous tissue: Secondary | ICD-10-CM | POA: Insufficient documentation

## 2013-05-09 DIAGNOSIS — Z794 Long term (current) use of insulin: Secondary | ICD-10-CM | POA: Insufficient documentation

## 2013-05-09 DIAGNOSIS — J441 Chronic obstructive pulmonary disease with (acute) exacerbation: Secondary | ICD-10-CM | POA: Insufficient documentation

## 2013-05-09 DIAGNOSIS — I10 Essential (primary) hypertension: Secondary | ICD-10-CM | POA: Insufficient documentation

## 2013-05-09 DIAGNOSIS — Z79899 Other long term (current) drug therapy: Secondary | ICD-10-CM | POA: Insufficient documentation

## 2013-05-09 DIAGNOSIS — Z7982 Long term (current) use of aspirin: Secondary | ICD-10-CM | POA: Insufficient documentation

## 2013-05-09 DIAGNOSIS — IMO0002 Reserved for concepts with insufficient information to code with codable children: Secondary | ICD-10-CM | POA: Insufficient documentation

## 2013-05-09 DIAGNOSIS — Z990 Dependence on aspirator: Secondary | ICD-10-CM | POA: Insufficient documentation

## 2013-05-09 DIAGNOSIS — Z791 Long term (current) use of non-steroidal anti-inflammatories (NSAID): Secondary | ICD-10-CM | POA: Insufficient documentation

## 2013-05-09 LAB — URINALYSIS, ROUTINE W REFLEX MICROSCOPIC
Glucose, UA: >1000 mg/dL — AB
Ketones, ur: NEGATIVE mg/dL
Leukocytes, UA: NEGATIVE
Protein, ur: NEGATIVE mg/dL
Specific Gravity, Urine: 1.025 (ref 1.005–1.030)
pH: 5.5 (ref 5.0–8.0)

## 2013-05-09 LAB — URINE MICROSCOPIC-ADD ON

## 2013-05-09 LAB — BASIC METABOLIC PANEL
Chloride: 95 mEq/L — ABNORMAL LOW (ref 96–112)
GFR calc Af Amer: >90 mL/min (ref >90)
Potassium: 4.1 mEq/L (ref 3.5–5.1)
Sodium: 133 mEq/L — ABNORMAL LOW (ref 135–145)

## 2013-05-09 LAB — CBC WITH DIFFERENTIAL/PLATELET
Basophils Absolute: 0.1 10*3/uL (ref 0.0–0.1)
Basophils Relative: 1 % (ref 0–1)
Hemoglobin: 12.4 g/dL — ABNORMAL LOW (ref 13.0–17.0)
MCHC: 32.9 g/dL (ref 30.0–36.0)
Neutro Abs: 7.9 10*3/uL — ABNORMAL HIGH (ref 1.7–7.7)
Neutrophils Relative %: 76 % (ref 43–77)
Platelets: 235 10*3/uL (ref 150–400)
RDW: 14.6 % (ref 11.5–15.5)

## 2013-05-09 LAB — TROPONIN I: Troponin I: <0.3 ng/mL (ref <0.30)

## 2013-05-09 LAB — PRO B NATRIURETIC PEPTIDE: Pro B Natriuretic peptide (BNP): 132.2 pg/mL — ABNORMAL HIGH (ref 0–125)

## 2013-05-09 MED ORDER — ALBUTEROL SULFATE (5 MG/ML) 0.5% IN NEBU
5.0000 mg | INHALATION_SOLUTION | Freq: Once | RESPIRATORY_TRACT | Status: AC
  Administered 2013-05-09: 5 mg via RESPIRATORY_TRACT
  Filled 2013-05-09: qty 1

## 2013-05-09 MED ORDER — PREDNISONE 20 MG PO TABS: 20.0000 mg | ORAL_TABLET | Freq: Two times a day (BID) | ORAL | Status: DC

## 2013-05-09 MED ORDER — IPRATROPIUM BROMIDE 0.02 % IN SOLN
0.5000 mg | Freq: Once | RESPIRATORY_TRACT | Status: AC
  Administered 2013-05-09: 0.5 mg via RESPIRATORY_TRACT
  Filled 2013-05-09: qty 2.5

## 2013-05-09 MED ORDER — PREDNISONE 50 MG PO TABS
60.0000 mg | ORAL_TABLET | Freq: Once | ORAL | Status: AC
  Administered 2013-05-09: 22:00:00 60 mg via ORAL
  Filled 2013-05-09 (×2): qty 1

## 2013-05-09 MED ORDER — IOHEXOL 350 MG/ML SOLN
120.0000 mL | Freq: Once | INTRAVENOUS | Status: AC | PRN
  Administered 2013-05-09: 120 mL via INTRAVENOUS

## 2013-05-09 MED ORDER — LEVOFLOXACIN 750 MG PO TABS: 750.0000 mg | ORAL_TABLET | Freq: Every day | ORAL | Status: DC

## 2013-05-09 NOTE — ED Notes (Signed)
Pt  Sat 92% HR 99 Ambulated pt entire circle on ED, Sat 91%, HR 102, pt denies sob

## 2013-05-09 NOTE — ED Provider Notes (Signed)
CSN: [id number]     Arrival date & time 05/09/13  1934 History  This chart was scribed for Flint Melter, MD by Carl Best, ED Scribe. This patient was seen in room [facility]11/[facility]11 and the patient's care was started at 8:03 PM.     Chief Complaint  Patient presents with  . Cough  . Chest Pain    Patient is a 72 y.o. male presenting with cough and chest pain. The history is provided by the patient. No language interpreter was used.  Cough Associated symptoms: chest pain   Associated symptoms: no fever   Chest Pain Associated symptoms: cough   Associated symptoms: no dizziness, no fever and not vomiting    HPI Comments: [name] is a 72 y.o. male with a history of Chronic Bronchitis, Hyperlipidemia, Gout and COPD who presents to the Emergency Department complaining of constant, non-productive cough and centralized chest pain that started at 12 PM today.  The patient states that he took Tylenol for his symptoms with no relief.  He denies fever, emesis, and dizziness as associated symptoms.  He denies any sick contacts.  He states that he has a history of MI.  He states that he has never had trouble with COPD.  He states that he takes Spiriva.  He states that his doctor is Dr. Juanetta Gosling.  He states that his wife brought him to the ED.    Past Medical History  Diagnosis Date  . Arthritis of knee   . GERD (gastroesophageal reflux disease)   . CAD (coronary artery disease)   . Obesity   . Hyperlipidemia   . Diabetes mellitus, type 1   . Hypertension   . Chronic back pain   . Gout   . Chronic bronchitis   . Psoriasis   . COPD (chronic obstructive pulmonary disease)   . Requires supplemental oxygen     2LPM [place]   Past Surgical History  Procedure Laterality Date  . Appendectomy    . Stent in heart  2009  . Cardiac catheterization      3or 4 stents  . Colonoscopy  03/17/2012    Procedure: COLONOSCOPY;  Surgeon: Malissa Hippo, MD;  Location: [facility];  Service:  Endoscopy;  Laterality: N/A;  830  . Cystoscopy with urethral dilatation N/A 11/26/2012    Procedure: CYSTOSCOPY WITH URETHRAL DILATATION;  Surgeon: Anner Crete, MD;  Location: [facility]S;  Service: Urology;  Laterality: N/A;   Family History  Problem Relation Age of Onset  . Hypertension Mother   . Diabetes Mother   . Hypertension Father   . Diabetes Brother   . Arthritis      Family History   . Diabetes      family History    History  Substance Use Topics  . Smoking status: Former Games developer  . Smokeless tobacco: Not on file  . Alcohol Use: No    Review of Systems  Constitutional: Negative for fever.  Respiratory: Positive for cough.   Cardiovascular: Positive for chest pain.  Gastrointestinal: Negative for vomiting.  Neurological: Negative for dizziness.  All other systems reviewed and are negative.    Allergies  Review of patient's allergies indicates no known allergies.  Home Medications   Current Outpatient Rx  Name  Route  Sig  Dispense  Refill  . albuterol (PROVENTIL HFA;VENTOLIN HFA) 108 (90 BASE) MCG/ACT inhaler   Inhalation   Inhale 1 puff into the lungs every 6 (six) hours as needed for  wheezing or shortness of breath.         . allopurinol (ZYLOPRIM) 300 MG tablet   Oral   Take 300 mg by mouth daily.         Marland Kitchen amLODipine (NORVASC) 5 MG tablet   Oral   Take 1 tablet (5 mg total) by mouth every morning.   90 tablet   1   . aspirin EC 325 MG tablet   Oral   Take 325 mg by mouth every morning.          . Choline Fenofibrate (TRILIPIX) 135 MG capsule   Oral   Take 135 mg by mouth daily.         Marland Kitchen ezetimibe (ZETIA) 10 MG tablet   Oral   Take 1 tablet (10 mg total) by mouth daily.   30 tablet   11   . insulin glargine (LANTUS) 100 units/mL SOLN   Subcutaneous   Inject 50 Units into the skin at bedtime.          . insulin lispro (HUMALOG KWIKPEN) 100 UNIT/ML injection   Subcutaneous   Inject 1-20 Units into the skin 3 (three) times daily  before meals. Sliding scale         . levothyroxine (SYNTHROID, LEVOTHROID) 75 MCG tablet   Oral   Take 75 mcg by mouth every morning.          Marland Kitchen lisinopril (PRINIVIL,ZESTRIL) 40 MG tablet   Oral   Take 1 tablet (40 mg total) by mouth daily.   90 tablet   1   . metFORMIN (GLUCOPHAGE) 500 MG tablet   Oral   Take 1,000 mg by mouth 2 (two) times daily.         . metoprolol (LOPRESSOR) 100 MG tablet   Oral   Take 1 tablet (100 mg total) by mouth 2 (two) times daily.   60 tablet   3   . naproxen (NAPROSYN) 500 MG tablet   Oral   Take 500 mg by mouth 2 (two) times daily.         . OXYGEN-HELIUM IN   Inhalation   Inhale 2 L into the lungs at bedtime.          Marland Kitchen tiotropium (SPIRIVA) 18 MCG inhalation capsule   Inhalation   Place 18 mcg into inhaler and inhale daily.         Marland Kitchen levofloxacin (LEVAQUIN) 750 MG tablet   Oral   Take 1 tablet (750 mg total) by mouth daily. X 7 days   7 tablet   0   . predniSONE (DELTASONE) 20 MG tablet   Oral   Take 1 tablet (20 mg total) by mouth 2 (two) times daily.   10 tablet   0    Triage Vitals: BP 177/80  Pulse 90  Temp(Src) 98.5 F (36.9 C) (Oral)  Resp 20  Ht 5\' 10"  (1.778 m)  Wt 237 lb (107.502 kg)  BMI 34.01 kg/m2  SpO2 93%  Physical Exam  Nursing note and vitals reviewed. Constitutional: He is oriented to person, place, and time. He appears well-developed and well-nourished.  HENT:  Head: Normocephalic and atraumatic.  Right Ear: External ear normal.  Left Ear: External ear normal.  Eyes: Conjunctivae and EOM are normal. Pupils are equal, round, and reactive to light.  Neck: Normal range of motion and phonation normal. Neck supple.  Cardiovascular: Normal rate, regular rhythm, normal heart sounds and intact distal pulses.   No JVD.  Pulmonary/Chest: Effort normal. He has decreased breath sounds (bilaterally at the bases). He exhibits no bony tenderness.  Frequent non-productive cough. Scattered rales.     Abdominal: Soft. Normal appearance. There is no tenderness.  Musculoskeletal: Normal range of motion. He exhibits edema (2+ bilaterally. ). He exhibits no tenderness (of his legs).  Neurological: He is alert and oriented to person, place, and time. No cranial nerve deficit or sensory deficit. He exhibits normal muscle tone. Coordination normal.  Skin: Skin is warm, dry and intact.  Psychiatric: He has a normal mood and affect. His behavior is normal. Judgment and thought content normal.    ED Course  Procedures (including critical care time)  DIAGNOSTIC STUDIES: Oxygen Saturation is 93% on room air, low by my interpretation.    COORDINATION OF CARE: 8:08 PM- Discussed administering medication in the ED and obtaining blood work with the patient.  The patient agreed to the treatment plan.    11:24 PM Reevaluation with update and discussion. After initial assessment and treatment, an updated evaluation reveals he is sitting up comfortably and has no further complaints. He has been able to ambulate with oxygen saturations greater than 90% and tolerated it well. Geovanie Winnett L   Patient Vitals for the past 24 hrs:  BP Temp Temp src Pulse Resp SpO2 Height Weight  05/09/13 2159 161/75 mmHg - - 97 20 95 % - -  05/09/13 2025 - - - - - 92 % - -  05/09/13 2022 - - - - - 92 % - -  05/09/13 1949 177/80 mmHg 98.5 F (36.9 C) Oral 90 20 93 % 5\' 10"  (1.778 m) 237 lb (107.502 kg)    Labs Review Labs Reviewed  CBC WITH DIFFERENTIAL - Abnormal; Notable for the following:    Hemoglobin 12.4 (*)    HCT 37.7 (*)    Neutro Abs 7.9 (*)    All other components within normal limits  BASIC METABOLIC PANEL - Abnormal; Notable for the following:    Sodium 133 (*)    Chloride 95 (*)    Glucose, Bld 339 (*)    GFR calc non Af Amer 84 (*)    All other components within normal limits  D-DIMER, QUANTITATIVE - Abnormal; Notable for the following:    D-Dimer, Quant 0.76 (*)    All other components within  normal limits  PRO B NATRIURETIC PEPTIDE - Abnormal; Notable for the following:    Pro B Natriuretic peptide (BNP) 132.2 (*)    All other components within normal limits  URINALYSIS, ROUTINE W REFLEX MICROSCOPIC - Abnormal; Notable for the following:    Glucose, UA >1000 (*)    Hgb urine dipstick TRACE (*)    Nitrite POSITIVE (*)    All other components within normal limits  URINE MICROSCOPIC-ADD ON - Abnormal; Notable for the following:    Bacteria, UA MANY (*)    All other components within normal limits  URINE CULTURE  TROPONIN I   Imaging Review Ct Angio Chest Pe W/cm &/or Wo Cm  05/09/2013   CLINICAL DATA:  Pulmonary embolism. Chest pain. Short of breath. Cough.  EXAM: CT ANGIOGRAPHY CHEST WITH CONTRAST  TECHNIQUE: Multidetector CT imaging of the chest was performed using the standard protocol during bolus administration of intravenous contrast. Multiplanar CT image reconstructions including MIPs were obtained to evaluate the vascular anatomy.  CONTRAST:  OMNIPAQUE IOHEXOL 350 MG/ML SOLN  COMPARISON:  CT chest 09/09/2004.  FINDINGS: Technically adequate study. No pulmonary embolism is present.  Coronary artery atherosclerosis is present. If office based assessment of coronary risk factors has not been performed, it is now recommended. No pericardial or pleural effusion. No axillary adenopathy. Prominent hilar lymph nodes are present which are likely congestive or reactive. 1 cm right hilar node is borderline in size (image 44 series 4).  Borderline size of the ascending aorta at 39 mm, unchanged from prior. Mural plaque is present in the aortic arch. Aortic branch vessels appear patent. There is no pleural effusion. The matter [place] all and scarring in the right lung appears unchanged compared to prior exam. No pneumonia. Mild bronchial wall thickening compatible with bronchitis. Spiculated density in the left upper lobe laterally is unchanged compared to 2006, also consistent with scarring  and benign. Incidental imaging of the upper abdomen is within normal limits. Descending thoracic and abdominal aortic atherosclerosis is incidentally noted.  The pulmonary artery is enlarged, consistent with pulmonary arterial hypertension. No aggressive osseous lesions. Small radiolucent lesions in the mid thoracic spine with sclerotic margins are most compatible with Schmorl's nodes.  Review of the MIP images confirms the above findings.  IMPRESSION: 1. No acute abnormality. Negative for pulmonary embolism or acute aortic injury. 2. Enlargement of the pulmonary arteries suggesting pulmonary arterial hypertension. 3. Borderline size of right hilar lymph nodes which may be congestive or reactive.   Electronically Signed   By: Andreas Newport M.D.   On: 05/09/2013 21:58   Dg Chest Port 1 View  05/09/2013   CLINICAL DATA:  Cough and chest pain  EXAM: PORTABLE CHEST - 1 VIEW  COMPARISON:  08/13/2012  FINDINGS: Cardiac shadow is stable. The lungs are clear bilaterally. No acute bony abnormality is seen.  IMPRESSION: No acute abnormality noted.   Electronically Signed   By: Alcide Clever M.D.   On: 05/09/2013 20:25    EKG Interpretation    Date/Time:  Monday May 09 2013 19:50:06 EST Ventricular Rate:  89 PR Interval:  198 QRS Duration: 76 QT Interval:  366 QTC Calculation: 445 R Axis:   -31 Text Interpretation:  Normal sinus rhythm Left axis deviation Abnormal ECG When compared with ECG of 08-Nov-2012 11:06, No significant change was found Confirmed by Effie Shy  MD, Arshia Spellman ([id number]) on 05/09/2013 8:02:43 PM            MDM   1. COPD exacerbation    Evaluation is consistent with exacerbation of COPD. No evidence for PE, pneumonia, or metabolic instability.  The patient appears reasonably screened and/or stabilized for discharge and I doubt any other medical condition or other [facility] requiring further screening, evaluation, or treatment in the ED at this time prior to discharge.  Nursing Notes  Reviewed/ Care Coordinated, and agree without changes. Applicable Imaging Reviewed.  Interpretation of Laboratory Data incorporated into ED treatment   Plan: Home Medications-  prednisone, Levaquin ; Home Treatments and Observation- Rest, Fluids; return here if the recommended treatment, does not improve the symptoms; Recommended follow up- PCP 1 week    I personally performed the services described in this documentation, which was scribed in my presence. The recorded information has been reviewed and is accurate.     Flint Melter, MD 05/09/13 [phone]

## 2013-05-09 NOTE — ED Notes (Signed)
Patient complaining of cough and central chest pain that started today.

## 2013-05-09 NOTE — ED Notes (Signed)
Called RT for breathing treatment, pt wife in waiting area, call bell in reach

## 2013-05-11 LAB — URINE CULTURE: Colony Count: >=100000

## 2013-05-16 ENCOUNTER — Telehealth: Payer: Self-pay | Admitting: Family Medicine

## 2013-05-16 NOTE — Telephone Encounter (Signed)
Patient states he doesn't need anything for cough now, he has gotten over it

## 2013-05-18 ENCOUNTER — Encounter: Payer: Self-pay | Admitting: Family Medicine

## 2013-05-18 ENCOUNTER — Emergency Department (HOSPITAL_COMMUNITY): Payer: Medicare Other

## 2013-05-18 ENCOUNTER — Encounter (HOSPITAL_COMMUNITY): Payer: Self-pay | Admitting: Emergency Medicine

## 2013-05-18 ENCOUNTER — Emergency Department (HOSPITAL_COMMUNITY)
Admission: EM | Admit: 2013-05-18 | Discharge: 2013-05-18 | Disposition: A | Discharge status: Home / Self Care | Payer: Medicare Other | Attending: Emergency Medicine | Admitting: Emergency Medicine

## 2013-05-18 ENCOUNTER — Ambulatory Visit (INDEPENDENT_AMBULATORY_CARE_PROVIDER_SITE_OTHER): Payer: Medicare Other | Admitting: Family Medicine

## 2013-05-18 VITALS — BP 164/90 | HR 73 | Resp 18 | Wt 229.1 lb

## 2013-05-18 DIAGNOSIS — I10 Essential (primary) hypertension: Secondary | ICD-10-CM | POA: Insufficient documentation

## 2013-05-18 DIAGNOSIS — E109 Type 1 diabetes mellitus without complications: Secondary | ICD-10-CM | POA: Insufficient documentation

## 2013-05-18 DIAGNOSIS — J449 Chronic obstructive pulmonary disease, unspecified: Secondary | ICD-10-CM

## 2013-05-18 DIAGNOSIS — I2510 Atherosclerotic heart disease of native coronary artery without angina pectoris: Secondary | ICD-10-CM | POA: Insufficient documentation

## 2013-05-18 DIAGNOSIS — G8929 Other chronic pain: Secondary | ICD-10-CM | POA: Insufficient documentation

## 2013-05-18 DIAGNOSIS — M549 Dorsalgia, unspecified: Secondary | ICD-10-CM | POA: Insufficient documentation

## 2013-05-18 DIAGNOSIS — Z95818 Presence of other cardiac implants and grafts: Secondary | ICD-10-CM | POA: Insufficient documentation

## 2013-05-18 DIAGNOSIS — Z9981 Dependence on supplemental oxygen: Secondary | ICD-10-CM | POA: Insufficient documentation

## 2013-05-18 DIAGNOSIS — Z9861 Coronary angioplasty status: Secondary | ICD-10-CM | POA: Insufficient documentation

## 2013-05-18 DIAGNOSIS — Z87891 Personal history of nicotine dependence: Secondary | ICD-10-CM | POA: Insufficient documentation

## 2013-05-18 DIAGNOSIS — Z794 Long term (current) use of insulin: Secondary | ICD-10-CM | POA: Insufficient documentation

## 2013-05-18 DIAGNOSIS — K219 Gastro-esophageal reflux disease without esophagitis: Secondary | ICD-10-CM | POA: Insufficient documentation

## 2013-05-18 DIAGNOSIS — L408 Other psoriasis: Secondary | ICD-10-CM | POA: Insufficient documentation

## 2013-05-18 DIAGNOSIS — Z791 Long term (current) use of non-steroidal anti-inflammatories (NSAID): Secondary | ICD-10-CM | POA: Insufficient documentation

## 2013-05-18 DIAGNOSIS — IMO0002 Reserved for concepts with insufficient information to code with codable children: Secondary | ICD-10-CM | POA: Insufficient documentation

## 2013-05-18 DIAGNOSIS — Z79899 Other long term (current) drug therapy: Secondary | ICD-10-CM | POA: Insufficient documentation

## 2013-05-18 DIAGNOSIS — J441 Chronic obstructive pulmonary disease with (acute) exacerbation: Secondary | ICD-10-CM

## 2013-05-18 DIAGNOSIS — N390 Urinary tract infection, site not specified: Secondary | ICD-10-CM

## 2013-05-18 DIAGNOSIS — M109 Gout, unspecified: Secondary | ICD-10-CM | POA: Insufficient documentation

## 2013-05-18 DIAGNOSIS — E1065 Type 1 diabetes mellitus with hyperglycemia: Secondary | ICD-10-CM

## 2013-05-18 DIAGNOSIS — E785 Hyperlipidemia, unspecified: Secondary | ICD-10-CM | POA: Insufficient documentation

## 2013-05-18 DIAGNOSIS — Z7982 Long term (current) use of aspirin: Secondary | ICD-10-CM | POA: Insufficient documentation

## 2013-05-18 LAB — CBC WITH DIFFERENTIAL/PLATELET
Basophils Absolute: 0.1 10*3/uL (ref 0.0–0.1)
Basophils Relative: 0 % (ref 0–1)
Eosinophils Absolute: 0.2 10*3/uL (ref 0.0–0.7)
Eosinophils Relative: 2 % (ref 0–5)
HCT: 39.5 % (ref 39.0–52.0)
Hemoglobin: 12.7 g/dL — ABNORMAL LOW (ref 13.0–17.0)
Lymphocytes Relative: 27 % (ref 12–46)
Lymphs Abs: 3.5 10*3/uL (ref 0.7–4.0)
MCH: 25.9 pg — ABNORMAL LOW (ref 26.0–34.0)
MCHC: 32.2 g/dL (ref 30.0–36.0)
MCV: 80.6 fL (ref 78.0–100.0)
Monocytes Absolute: 1 10*3/uL (ref 0.1–1.0)
Monocytes Relative: 8 % (ref 3–12)
Neutro Abs: 8.1 10*3/uL — ABNORMAL HIGH (ref 1.7–7.7)
Neutrophils Relative %: 63 % (ref 43–77)
Platelets: 252 10*3/uL (ref 150–400)
RBC: 4.9 MIL/uL (ref 4.22–5.81)
RDW: 14.9 % (ref 11.5–15.5)
WBC: 12.9 10*3/uL — ABNORMAL HIGH (ref 4.0–10.5)

## 2013-05-18 LAB — BASIC METABOLIC PANEL
BUN: 15 mg/dL (ref 6–23)
CO2: 27 mEq/L (ref 19–32)
Calcium: 9.1 mg/dL (ref 8.4–10.5)
Chloride: 97 mEq/L (ref 96–112)
Creatinine, Ser: 0.78 mg/dL (ref 0.50–1.35)
GFR calc Af Amer: >90 mL/min (ref >90)
GFR calc non Af Amer: 89 mL/min — ABNORMAL LOW (ref >90)
Glucose, Bld: 308 mg/dL — ABNORMAL HIGH (ref 70–99)
Potassium: 4.4 mEq/L (ref 3.7–5.3)
Sodium: 137 mEq/L (ref 137–147)

## 2013-05-18 LAB — PRO B NATRIURETIC PEPTIDE: Pro B Natriuretic peptide (BNP): 137.1 pg/mL — ABNORMAL HIGH (ref 0–125)

## 2013-05-18 LAB — GLUCOSE, POCT (MANUAL RESULT ENTRY): POC Glucose: 330 mg/dl — AB (ref 70–99)

## 2013-05-18 LAB — TROPONIN I: Troponin I: <0.3 ng/mL (ref <0.30)

## 2013-05-18 MED ORDER — PREDNISONE (PAK) 10 MG PO TABS: ORAL_TABLET | Freq: Every day | ORAL | Status: DC

## 2013-05-18 MED ORDER — ALBUTEROL SULFATE (2.5 MG/3ML) 0.083% IN NEBU
5.0000 mg | INHALATION_SOLUTION | Freq: Once | RESPIRATORY_TRACT | Status: AC
  Administered 2013-05-18: 5 mg via RESPIRATORY_TRACT
  Filled 2013-05-18: qty 6

## 2013-05-18 MED ORDER — METHYLPREDNISOLONE SODIUM SUCC 125 MG IJ SOLR
125.0000 mg | Freq: Once | INTRAMUSCULAR | Status: AC
  Administered 2013-05-18: 125 mg via INTRAVENOUS
  Filled 2013-05-18: qty 2

## 2013-05-18 MED ORDER — TIOTROPIUM BROMIDE MONOHYDRATE 18 MCG IN CAPS: 18.0000 ug | ORAL_CAPSULE | Freq: Every day | RESPIRATORY_TRACT | Status: DC

## 2013-05-18 MED ORDER — ALBUTEROL SULFATE (2.5 MG/3ML) 0.083% IN NEBU
5.0000 mg | INHALATION_SOLUTION | Freq: Once | RESPIRATORY_TRACT | Status: DC
  Filled 2013-05-18: qty 6

## 2013-05-18 NOTE — ED Notes (Signed)
EDP states to cancel 2nd breathing tx and to d/c pt. Pt in nad. States feels better.

## 2013-05-18 NOTE — ED Provider Notes (Signed)
CSN: [id number]     Arrival date & time 05/18/13  1425 History  This chart was scribed for Raeford Razor, MD by Blanchard Kelch, ED Scribe. The patient was seen in room APA02/APA02. Patient's care was started at 3:32 PM.     No chief complaint on file.   The history is provided by the patient. No language interpreter was used.    HPI Comments: [name] is a 72 y.o. male with a history of COPD, CAD and MI who presents to the Emergency Department complaining of constant, worsening shortness of breath that began nine days ago. The SOB is not exacerbated by any certain position that he has noticed. He was seen here in the ED nine days ago for similar symptoms and was discharged with a prescription for Prednisone and Levaquin. He states he has been taking the medications compliantly without improvement. He states he is out of both medications. He finished one medication yesterday and one today but does not remember which medication was finished yesterday versus today. He has a dry cough that he states is baseline for him. His relative states he has been sleeping much more frequently recently. He is on 2L oxygen at home when he sleeps. He is not on oxygen during the day. He denies fever, chills, abdominal pain, chest pain, myalgias, or abnormal leg swelling.       Past Medical History  Diagnosis Date  . Arthritis of knee   . GERD (gastroesophageal reflux disease)   . CAD (coronary artery disease)   . Obesity   . Hyperlipidemia   . Diabetes mellitus, type 1   . Hypertension   . Chronic back pain   . Gout   . Chronic bronchitis   . Psoriasis   . COPD (chronic obstructive pulmonary disease)   . Requires supplemental oxygen     2LPM [place]   Past Surgical History  Procedure Laterality Date  . Appendectomy    . Stent in heart  2009  . Cardiac catheterization      3or 4 stents  . Colonoscopy  03/17/2012    Procedure: COLONOSCOPY;  Surgeon: Malissa Hippo, MD;  Location: [facility];   Service: Endoscopy;  Laterality: N/A;  830  . Cystoscopy with urethral dilatation N/A 11/26/2012    Procedure: CYSTOSCOPY WITH URETHRAL DILATATION;  Surgeon: Anner Crete, MD;  Location: [facility];  Service: Urology;  Laterality: N/A;   Family History  Problem Relation Age of Onset  . Hypertension Mother   . Diabetes Mother   . Hypertension Father   . Diabetes Brother   . Arthritis      Family History   . Diabetes      family History    History  Substance Use Topics  . Smoking status: Former Games developer  . Smokeless tobacco: Not on file  . Alcohol Use: No    Review of Systems  Constitutional: Negative for fever and chills.  Respiratory: Positive for cough and shortness of breath.   Cardiovascular: Negative for chest pain and leg swelling.  Gastrointestinal: Negative for abdominal pain.  Musculoskeletal: Negative for myalgias.  All other systems reviewed and are negative.    Allergies  Review of patient's allergies indicates no known allergies.  Home Medications   Current Outpatient Rx  Name  Route  Sig  Dispense  Refill  . albuterol (PROVENTIL HFA;VENTOLIN HFA) 108 (90 BASE) MCG/ACT inhaler   Inhalation   Inhale 1 puff into the lungs every 6 (six)  hours as needed for wheezing or shortness of breath.         . allopurinol (ZYLOPRIM) 300 MG tablet   Oral   Take 300 mg by mouth daily.         Marland Kitchen amLODipine (NORVASC) 5 MG tablet   Oral   Take 1 tablet (5 mg total) by mouth every morning.   90 tablet   1   . aspirin EC 325 MG tablet   Oral   Take 325 mg by mouth every morning.          . Choline Fenofibrate (TRILIPIX) 135 MG capsule   Oral   Take 135 mg by mouth daily.         Marland Kitchen ezetimibe (ZETIA) 10 MG tablet   Oral   Take 1 tablet (10 mg total) by mouth daily.   30 tablet   11   . insulin glargine (LANTUS) 100 units/mL SOLN   Subcutaneous   Inject 50 Units into the skin at bedtime.          . insulin lispro (HUMALOG KWIKPEN) 100 UNIT/ML injection    Subcutaneous   Inject 1-20 Units into the skin 3 (three) times daily before meals. Sliding scale         . levothyroxine (SYNTHROID, LEVOTHROID) 75 MCG tablet   Oral   Take 75 mcg by mouth every morning.          Marland Kitchen lisinopril (PRINIVIL,ZESTRIL) 40 MG tablet   Oral   Take 1 tablet (40 mg total) by mouth daily.   90 tablet   1   . metFORMIN (GLUCOPHAGE) 500 MG tablet   Oral   Take 1,000 mg by mouth 2 (two) times daily.         . metoprolol (LOPRESSOR) 100 MG tablet   Oral   Take 1 tablet (100 mg total) by mouth 2 (two) times daily.   60 tablet   3   . naproxen (NAPROSYN) 500 MG tablet   Oral   Take 500 mg by mouth 2 (two) times daily.         . OXYGEN-HELIUM IN   Inhalation   Inhale 2 L into the lungs at bedtime.          . predniSONE (DELTASONE) 20 MG tablet   Oral   Take 1 tablet (20 mg total) by mouth 2 (two) times daily.   10 tablet   0   . tiotropium (SPIRIVA) 18 MCG inhalation capsule   Inhalation   Place 18 mcg into inhaler and inhale daily.          Triage Vitals: BP 156/71  Pulse 67  Temp(Src) 97.4 F (36.3 C) (Oral)  Resp 24  Ht 5\' 11"  (1.803 m)  Wt 229 lb (103.874 kg)  BMI 31.95 kg/m2  SpO2 93%  Physical Exam  Nursing note and vitals reviewed. Constitutional: He is oriented to person, place, and time. He appears well-developed and well-nourished.  HENT:  Head: Normocephalic and atraumatic.  Eyes: Conjunctivae and EOM are normal. Pupils are equal, round, and reactive to light.  Neck: Normal range of motion. Neck supple.  Cardiovascular: Normal rate, regular rhythm and normal heart sounds.   Pulmonary/Chest: Effort normal. He has wheezes. He has rales.  Audibly wheezing. Rales at both bases.   Abdominal: Soft. Bowel sounds are normal.  Musculoskeletal: Normal range of motion.  Symmetric bilateral lower extremity edema, mild.   Neurological: He is alert and oriented to person, place, and  time.  Skin: Skin is warm and dry.   Psychiatric: He has a normal mood and affect. His behavior is normal.    ED Course  Procedures (including critical care time)  DIAGNOSTIC STUDIES: Oxygen Saturation is 93% on room air, adequate by my interpretation.    COORDINATION OF CARE: 3:40 PM -Will order breathing treatment and chest x-ray. Patient verbalizes understanding and agrees with treatment plan.  5:06 PM -Discussed with patient plan to place him back on Prednisone. Patient uses an albuterol pump at home but is not currently on Spiriva because he ran out. Will also give patient prescription for Spiriva. He is improved and feels well enough to go home. Offered admission for observation since he has not had significant improvement despite finishes out of levaquin and steroids. Pt electing to go home though. I think this is reasonable. Patient verbalizes understanding and agrees with treatment plan.  Labs Review Labs Reviewed  PRO B NATRIURETIC PEPTIDE - Abnormal; Notable for the following:    Pro B Natriuretic peptide (BNP) 137.1 (*)    All other components within normal limits  BASIC METABOLIC PANEL - Abnormal; Notable for the following:    Glucose, Bld 308 (*)    GFR calc non Af Amer 89 (*)    All other components within normal limits  CBC WITH DIFFERENTIAL - Abnormal; Notable for the following:    WBC 12.9 (*)    Hemoglobin 12.7 (*)    MCH 25.9 (*)    Neutro Abs 8.1 (*)    All other components within normal limits  TROPONIN I   Imaging Review Dg Chest 2 View  05/18/2013   CLINICAL DATA:  Cough. Shortness of breath. COPD. Coronary artery disease.  EXAM: CHEST  2 VIEW  COMPARISON:  05/09/2013  FINDINGS: Heart size is within normal limits. Pulmonary hyperinflation again seen, consistent with COPD. Bilateral pleural-parenchymal scarring noted. No evidence of acute infiltrate or edema. No evidence of pleural effusion. No mass or lymphadenopathy identified.  IMPRESSION: COPD.  No active disease.   Electronically Signed    By: Myles Rosenthal M.D.   On: 05/18/2013 16:12    EKG Interpretation    Date/Time:  Wednesday May 18 2013 14:35:59 EST Ventricular Rate:  64 PR Interval:  196 QRS Duration: 78 QT Interval:  408 QTC Calculation: 420 R Axis:   -16 Text Interpretation:  Normal sinus rhythm hard to interpret with wander in v3, buts appears to have poor r wave progression No significant change since last tracing Confirmed by Debara Kamphuis  MD, Annais Crafts ([id number]) on 05/18/2013 4:55:50 PM            MDM   1. COPD exacerbation    72yM with likely COPD exacerbation/bronchitis. No focal infiltrate on CXR. Just finished course of levaquin. Will not continue abx at this time but will place back on steroids. Return precautions discussed. Outpt FU.  I personally reviewed pt's radiographic studies and prior medical records.  I personally preformed the services scribed in my presence. The recorded information has been reviewed is accurate. Raeford Razor, MD.     Raeford Razor, MD 05/18/13 [phone]

## 2013-05-18 NOTE — Patient Instructions (Signed)
You need to go directly to the ED for evaluation.  I have spoken directly with the Ed Doctor , so you are expected.  I will also also attempt to spk with the admitting Doctor about you

## 2013-05-18 NOTE — Progress Notes (Signed)
   Subjective:    Patient ID: [name], male    DOB: 03-18-1941, 72 y.o.   MRN: [id number]  HPI Pt in for Ed f/u approx 10 days ago for COPD exacerbation. Pt reports breathing is improved, but he has neither any appetite or energy adn is noted to e excessively sleepy. Spouse accompanies him and notes continued deterioation in his condition  Over th past week. Record review reveals that pt actually had a documented UTI when seen in the ED, neither he or hois wife recall being aware of this and state he has had no antibiotics. \Of note, this past summer , due to recurrent UTI pt had urethral dilation and is followed by urology. No known fever  In padst week but reports chills   Review of Systems See HPI  Denies sinus pressure, nasal congestion, ear pain or sore throat.  Denies chest pains or  palpitations chronic  leg swelling . Chronic joint pain  and limitation in mobility. Denies headaches, seizures, numbness, or tingling. Denies depression, anxiety or insomnia. Chronic severe psoriasis      Objective:   Physical Exam Patient drowsy, incapable of history without assistance from spouse, in no C/P distress  HEENT: No facial asymmetry, EOMI, no sinus tenderness,  oropharynx pink and moist.  Neck decreased ROM no adenopathy.  Chest: decreased air entry, though adequate , few wheezes scattered bilaterally, no crackles  CVS: S1, S2 no murmurs, no S3.  ABD: Soft non tender. Bowel sounds normal.  Ext: 2 plus pitting  edema  MS: decreased ROM spine, shoulders, hips and knees.  Skin: Intact, no ulcerations or rash noted.  CNS: CN 2-12 intact, power,  normal throughout.        Assessment & Plan:

## 2013-05-18 NOTE — ED Notes (Signed)
Pt reports was evaluated here recently and sent to PCP for follow up.  Dr. Lodema Hong sent pt back for evaluation of COPD exacerbation.  Reports pt still has productive cough, SOB and fatigue.  Pt alert and oriented.  Denies any pain.  NAD noted.

## 2013-05-22 ENCOUNTER — Telehealth: Payer: Self-pay | Admitting: Family Medicine

## 2013-05-22 DIAGNOSIS — N390 Urinary tract infection, site not specified: Secondary | ICD-10-CM | POA: Insufficient documentation

## 2013-05-22 DIAGNOSIS — B351 Tinea unguium: Secondary | ICD-10-CM | POA: Insufficient documentation

## 2013-05-22 DIAGNOSIS — Z794 Long term (current) use of insulin: Secondary | ICD-10-CM

## 2013-05-22 DIAGNOSIS — E1121 Type 2 diabetes mellitus with diabetic nephropathy: Secondary | ICD-10-CM | POA: Insufficient documentation

## 2013-05-22 DIAGNOSIS — E119 Type 2 diabetes mellitus without complications: Secondary | ICD-10-CM | POA: Insufficient documentation

## 2013-05-22 NOTE — Assessment & Plan Note (Signed)
imporatnce of  Blood sugar improvement and controlled for renal protection

## 2013-05-22 NOTE — Assessment & Plan Note (Signed)
Uncontrolled, will re address at f/u

## 2013-05-22 NOTE — Assessment & Plan Note (Signed)
Reports increased uncontrolle dpain woith poor mobility and instability, refer ortho for further eval and management

## 2013-05-22 NOTE — Assessment & Plan Note (Signed)
Uncontrolled, pt out of medication. Importance of daily med compliance  Stressed to reduce risk of stroke, or heart failure. Med refill sent

## 2013-05-22 NOTE — Assessment & Plan Note (Signed)
Deteriorated, needss to re establish with endo, I again advised him of this

## 2013-05-22 NOTE — Telephone Encounter (Signed)
Pls contact pt , let him know and send in medication to take daily for fungal toenail infection (terbinafine is already entered) since liver function is normal it is safe for him to take this.(This is from early Dec visit) Also remind him of need to get ack to Dr [name]. I just spoke with him and he states much better now getting antibiotics for UTIU from recent Ed visit

## 2013-05-22 NOTE — Assessment & Plan Note (Signed)
Recurrent UTI approx 10 days ago, pt and spouse state he hjas been on no antibiotic and his condition has deteriorated. Concerned re possible urosepsis , based on history and exam , ED evl, also spoke with admitting Doc

## 2013-05-22 NOTE — Assessment & Plan Note (Signed)
Deterioratede, extensive scaling and rash on anterior shins, refer dermatology for assistance with care

## 2013-05-22 NOTE — Assessment & Plan Note (Signed)
Elevated TG and low LDl , needs to reduce fat intake , increase activity as tolerated, established CAD

## 2013-05-22 NOTE — Assessment & Plan Note (Signed)
Improved since recent Ed visit

## 2013-05-23 ENCOUNTER — Other Ambulatory Visit: Payer: Self-pay

## 2013-05-23 DIAGNOSIS — B351 Tinea unguium: Secondary | ICD-10-CM

## 2013-05-23 MED ORDER — TERBINAFINE HCL 250 MG PO TABS: 250.0000 mg | ORAL_TABLET | Freq: Every day | ORAL | Status: DC

## 2013-05-23 NOTE — Telephone Encounter (Signed)
Patient aware.  Already has appt with Dr [name]

## 2013-05-24 ENCOUNTER — Encounter (HOSPITAL_COMMUNITY): Payer: Self-pay | Admitting: Emergency Medicine

## 2013-05-24 ENCOUNTER — Inpatient Hospital Stay (HOSPITAL_COMMUNITY)
Admission: EM | Admit: 2013-05-24 | Discharge: 2013-05-28 | DRG: 193 | Disposition: A | Discharge status: Home / Self Care | Payer: Medicare Other | Attending: Family Medicine | Admitting: Family Medicine

## 2013-05-24 ENCOUNTER — Emergency Department (HOSPITAL_COMMUNITY): Payer: Medicare Other

## 2013-05-24 DIAGNOSIS — R739 Hyperglycemia, unspecified: Secondary | ICD-10-CM

## 2013-05-24 DIAGNOSIS — E039 Hypothyroidism, unspecified: Secondary | ICD-10-CM | POA: Diagnosis present

## 2013-05-24 DIAGNOSIS — Z91199 Patient's noncompliance with other medical treatment and regimen due to unspecified reason: Secondary | ICD-10-CM

## 2013-05-24 DIAGNOSIS — K219 Gastro-esophageal reflux disease without esophagitis: Secondary | ICD-10-CM | POA: Diagnosis present

## 2013-05-24 DIAGNOSIS — G8929 Other chronic pain: Secondary | ICD-10-CM | POA: Diagnosis present

## 2013-05-24 DIAGNOSIS — E871 Hypo-osmolality and hyponatremia: Secondary | ICD-10-CM | POA: Diagnosis present

## 2013-05-24 DIAGNOSIS — I2510 Atherosclerotic heart disease of native coronary artery without angina pectoris: Secondary | ICD-10-CM | POA: Diagnosis present

## 2013-05-24 DIAGNOSIS — E1165 Type 2 diabetes mellitus with hyperglycemia: Secondary | ICD-10-CM

## 2013-05-24 DIAGNOSIS — Z9119 Patient's noncompliance with other medical treatment and regimen: Secondary | ICD-10-CM

## 2013-05-24 DIAGNOSIS — Z833 Family history of diabetes mellitus: Secondary | ICD-10-CM

## 2013-05-24 DIAGNOSIS — L408 Other psoriasis: Secondary | ICD-10-CM | POA: Diagnosis present

## 2013-05-24 DIAGNOSIS — J189 Pneumonia, unspecified organism: Principal | ICD-10-CM | POA: Diagnosis present

## 2013-05-24 DIAGNOSIS — R0602 Shortness of breath: Secondary | ICD-10-CM

## 2013-05-24 DIAGNOSIS — IMO0001 Reserved for inherently not codable concepts without codable children: Secondary | ICD-10-CM | POA: Diagnosis present

## 2013-05-24 DIAGNOSIS — IMO0002 Reserved for concepts with insufficient information to code with codable children: Secondary | ICD-10-CM

## 2013-05-24 DIAGNOSIS — E785 Hyperlipidemia, unspecified: Secondary | ICD-10-CM | POA: Diagnosis present

## 2013-05-24 DIAGNOSIS — K5900 Constipation, unspecified: Secondary | ICD-10-CM | POA: Diagnosis present

## 2013-05-24 DIAGNOSIS — Z7982 Long term (current) use of aspirin: Secondary | ICD-10-CM

## 2013-05-24 DIAGNOSIS — Z8249 Family history of ischemic heart disease and other diseases of the circulatory system: Secondary | ICD-10-CM

## 2013-05-24 DIAGNOSIS — Z87891 Personal history of nicotine dependence: Secondary | ICD-10-CM

## 2013-05-24 DIAGNOSIS — Z794 Long term (current) use of insulin: Secondary | ICD-10-CM

## 2013-05-24 DIAGNOSIS — J449 Chronic obstructive pulmonary disease, unspecified: Secondary | ICD-10-CM | POA: Diagnosis present

## 2013-05-24 DIAGNOSIS — M549 Dorsalgia, unspecified: Secondary | ICD-10-CM | POA: Diagnosis present

## 2013-05-24 DIAGNOSIS — I10 Essential (primary) hypertension: Secondary | ICD-10-CM | POA: Diagnosis present

## 2013-05-24 DIAGNOSIS — Z9981 Dependence on supplemental oxygen: Secondary | ICD-10-CM

## 2013-05-24 DIAGNOSIS — E876 Hypokalemia: Secondary | ICD-10-CM | POA: Diagnosis not present

## 2013-05-24 DIAGNOSIS — M1710 Unilateral primary osteoarthritis, unspecified knee: Secondary | ICD-10-CM | POA: Diagnosis present

## 2013-05-24 DIAGNOSIS — J9620 Acute and chronic respiratory failure, unspecified whether with hypoxia or hypercapnia: Secondary | ICD-10-CM | POA: Diagnosis present

## 2013-05-24 DIAGNOSIS — E1142 Type 2 diabetes mellitus with diabetic polyneuropathy: Secondary | ICD-10-CM | POA: Diagnosis present

## 2013-05-24 LAB — CBC WITH DIFFERENTIAL/PLATELET
BASOS PCT: 0 % (ref 0–1)
Basophils Absolute: 0 10*3/uL (ref 0.0–0.1)
EOS ABS: 0 10*3/uL (ref 0.0–0.7)
EOS PCT: 0 % (ref 0–5)
HEMATOCRIT: 41.5 % (ref 39.0–52.0)
Hemoglobin: 13.7 g/dL (ref 13.0–17.0)
Lymphocytes Relative: 12 % (ref 12–46)
Lymphs Abs: 2.3 10*3/uL (ref 0.7–4.0)
MCH: 25.9 pg — AB (ref 26.0–34.0)
MCHC: 33 g/dL (ref 30.0–36.0)
MCV: 78.6 fL (ref 78.0–100.0)
MONO ABS: 2.1 10*3/uL — AB (ref 0.1–1.0)
Monocytes Relative: 11 % (ref 3–12)
NEUTROS ABS: 14.8 10*3/uL — AB (ref 1.7–7.7)
NEUTROS PCT: 77 % (ref 43–77)
Platelets: 311 10*3/uL (ref 150–400)
RBC: 5.28 MIL/uL (ref 4.22–5.81)
RDW: 15.1 % (ref 11.5–15.5)
WBC: 19.2 10*3/uL — ABNORMAL HIGH (ref 4.0–10.5)

## 2013-05-24 LAB — BASIC METABOLIC PANEL
BUN: 25 mg/dL — AB (ref 6–23)
CALCIUM: 9.2 mg/dL (ref 8.4–10.5)
CO2: 23 meq/L (ref 19–32)
Chloride: 91 mEq/L — ABNORMAL LOW (ref 96–112)
Creatinine, Ser: 0.9 mg/dL (ref 0.50–1.35)
GFR calc Af Amer: >90 mL/min (ref >90)
GFR calc non Af Amer: 84 mL/min — ABNORMAL LOW (ref >90)
GLUCOSE: 528 mg/dL — AB (ref 70–99)
Potassium: 4 mEq/L (ref 3.7–5.3)
Sodium: 129 mEq/L — ABNORMAL LOW (ref 137–147)

## 2013-05-24 LAB — TROPONIN I: Troponin I: <0.3 ng/mL (ref <0.30)

## 2013-05-24 MED ORDER — DEXTROSE-NACL 5-0.45 % IV SOLN
INTRAVENOUS | Status: DC
Start: 2013-05-24 — End: 2013-05-25

## 2013-05-24 MED ORDER — DEXTROSE 5 % IV SOLN
1.0000 g | Freq: Once | INTRAVENOUS | Status: AC
  Administered 2013-05-24: 1 g via INTRAVENOUS
  Filled 2013-05-24: qty 10

## 2013-05-24 MED ORDER — SODIUM CHLORIDE 0.9 % IV BOLUS (SEPSIS)
1000.0000 mL | Freq: Once | INTRAVENOUS | Status: AC
  Administered 2013-05-24: 1000 mL via INTRAVENOUS

## 2013-05-24 MED ORDER — SODIUM CHLORIDE 0.9 % IV SOLN
INTRAVENOUS | Status: DC
  Filled 2013-05-24: qty 1

## 2013-05-24 MED ORDER — INSULIN ASPART 100 UNIT/ML ~~LOC~~ SOLN: 10.0000 [IU] | Freq: Once | SUBCUTANEOUS | Status: DC

## 2013-05-24 MED ORDER — DEXTROSE 5 % IV SOLN
500.0000 mg | Freq: Once | INTRAVENOUS | Status: DC
  Administered 2013-05-25: 500 mg via INTRAVENOUS

## 2013-05-24 NOTE — ED Notes (Signed)
Pt reports cough and congestion for several weeks, no fever, dry cough. Denies any nausea or vomiting.  General body aches.  Went to his pmd last week for the same. Also reports no bm x2 weeks.  Has not tried any laxatives or stool softeners.

## 2013-05-24 NOTE — ED Provider Notes (Signed)
CSN: [id number]     Arrival date & time 05/24/13  1554 History   First MD Initiated Contact with Patient 05/24/13 2248     Chief Complaint  Patient presents with  . Cough  . Nasal Congestion  . Constipation   (Consider location/radiation/quality/duration/timing/severity/associated sxs/prior Treatment) HPI Comments: [name] is a 73 y.o. Male with a past medical history significant for COPD on chronic home oxygen at night and diabetes presenting with a 2 week history of persistent non productive cough, shortness of breath, weakness and generalized body aches.  He has had intermittent wheezing worsening with exertion, none currently. He denies fevers, but has had anorexia with no oral intake since yesterday.  He also notes has not had a bm in 2 weeks.  However,  Denies abdominal or rectal pain or abdominal distention.  He was seen here one week ago with a COPD exacerbation at which time his chest xray was negative.  He was placed on steroids which he finished today, prior to that visit had just completed a course of levaquin.     Patient is a 73 y.o. male presenting with constipation. The history is provided by the patient.  Constipation Associated symptoms: no abdominal pain, no fever, no nausea and no vomiting     Past Medical History  Diagnosis Date  . Arthritis of knee   . GERD (gastroesophageal reflux disease)   . CAD (coronary artery disease)   . Obesity   . Hyperlipidemia   . Diabetes mellitus, type 1   . Hypertension   . Chronic back pain   . Gout   . Chronic bronchitis   . Psoriasis   . COPD (chronic obstructive pulmonary disease)   . Requires supplemental oxygen     2LPM [place]   Past Surgical History  Procedure Laterality Date  . Appendectomy    . Stent in heart  2009  . Cardiac catheterization      3or 4 stents  . Colonoscopy  03/17/2012    Procedure: COLONOSCOPY;  Surgeon: Rogene Houston, MD;  Location: [facility];  Service: Endoscopy;  Laterality: N/A;   830  . Cystoscopy with urethral dilatation N/A 11/26/2012    Procedure: CYSTOSCOPY WITH URETHRAL DILATATION;  Surgeon: Malka So, MD;  Location: [facility];  Service: Urology;  Laterality: N/A;   Family History  Problem Relation Age of Onset  . Hypertension Mother   . Diabetes Mother   . Hypertension Father   . Diabetes Brother   . Arthritis      Family History   . Diabetes      family History    History  Substance Use Topics  . Smoking status: Former Research scientist (life sciences)  . Smokeless tobacco: Not on file  . Alcohol Use: No    Review of Systems  Constitutional: Positive for activity change, appetite change and fatigue. Negative for fever.  HENT: Negative for congestion and sore throat.   Eyes: Negative.   Respiratory: Positive for cough and shortness of breath. Negative for chest tightness and wheezing.   Cardiovascular: Negative for chest pain, palpitations and leg swelling.  Gastrointestinal: Positive for constipation. Negative for nausea, vomiting and abdominal pain.  Genitourinary: Negative.   Musculoskeletal: Negative for arthralgias, joint swelling and neck pain.  Skin: Negative.  Negative for rash and wound.  Neurological: Positive for weakness. Negative for dizziness, light-headedness, numbness and headaches.  Psychiatric/Behavioral: Negative.     Allergies  Review of patient's allergies indicates no known allergies.  Home  Medications   Current Outpatient Rx  Name  Route  Sig  Dispense  Refill  . allopurinol (ZYLOPRIM) 300 MG tablet   Oral   Take 300 mg by mouth daily.         Marland Kitchen amLODipine (NORVASC) 5 MG tablet   Oral   Take 1 tablet (5 mg total) by mouth every morning.   90 tablet   1   . aspirin EC 325 MG tablet   Oral   Take 325 mg by mouth every morning.          . Choline Fenofibrate (TRILIPIX) 135 MG capsule   Oral   Take 135 mg by mouth daily.         Marland Kitchen ezetimibe (ZETIA) 10 MG tablet   Oral   Take 1 tablet (10 mg total) by mouth daily.   30  tablet   11   . HYDROcodone-acetaminophen (NORCO/VICODIN) 5-325 MG per tablet   Oral   Take 1 tablet by mouth every 6 (six) hours as needed.         . insulin glargine (LANTUS) 100 units/mL SOLN   Subcutaneous   Inject 50 Units into the skin at bedtime.          . insulin lispro (HUMALOG KWIKPEN) 100 UNIT/ML injection   Subcutaneous   Inject 1-20 Units into the skin 3 (three) times daily before meals. Sliding scale         . levothyroxine (SYNTHROID, LEVOTHROID) 75 MCG tablet   Oral   Take 75 mcg by mouth every morning.          Marland Kitchen lisinopril (PRINIVIL,ZESTRIL) 40 MG tablet   Oral   Take 1 tablet (40 mg total) by mouth daily.   90 tablet   1   . metFORMIN (GLUCOPHAGE) 500 MG tablet   Oral   Take 1,000 mg by mouth 2 (two) times daily.         . metoprolol (LOPRESSOR) 100 MG tablet   Oral   Take 1 tablet (100 mg total) by mouth 2 (two) times daily.   60 tablet   3   . naproxen (NAPROSYN) 500 MG tablet   Oral   Take 500 mg by mouth 2 (two) times daily.         . OXYGEN-HELIUM IN   Inhalation   Inhale 2 L into the lungs at bedtime.          . predniSONE (STERAPRED UNI-PAK) 10 MG tablet   Oral   Take by mouth daily. 6 tablets (60mg ) day one, 50mg  day 2, 40mg  day 3, 30 mg day 4, 20mg  day 5, 10 mg day 6   21 tablet   0   . terbinafine (LAMISIL) 250 MG tablet   Oral   Take 1 tablet (250 mg total) by mouth daily.   30 tablet   2   . tiotropium (SPIRIVA) 18 MCG inhalation capsule   Inhalation   Place 1 capsule (18 mcg total) into inhaler and inhale daily.   30 capsule   0   . albuterol (PROVENTIL HFA;VENTOLIN HFA) 108 (90 BASE) MCG/ACT inhaler   Inhalation   Inhale 1 puff into the lungs every 6 (six) hours as needed for wheezing or shortness of breath.          BP 106/61  Pulse 88  Temp(Src) 98.4 F (36.9 C) (Oral)  Resp 20  Ht 5\' 11"  (1.803 m)  Wt 222 lb (100.699 kg)  BMI 30.98  kg/m2  SpO2 94% Physical Exam  Nursing note and vitals  reviewed. Constitutional: He appears well-developed and well-nourished.  HENT:  Head: Normocephalic and atraumatic.  Mouth/Throat: Mucous membranes are dry.  Eyes: Conjunctivae are normal.  Neck: Normal range of motion.  Cardiovascular: Normal rate, regular rhythm, normal heart sounds and intact distal pulses.   Pulmonary/Chest: He is in respiratory distress. He has no wheezes. He has rales in the right lower field and the left lower field.  Abdominal: Soft. Bowel sounds are normal. There is no tenderness.  Musculoskeletal: Normal range of motion.  Neurological: He is alert.  Skin: Skin is warm and dry.  Psychiatric: He has a normal mood and affect.    ED Course  Procedures (including critical care time) Labs Review Labs Reviewed  CBC WITH DIFFERENTIAL - Abnormal; Notable for the following:    WBC 19.2 (*)    MCH 25.9 (*)    Neutro Abs 14.8 (*)    Monocytes Absolute 2.1 (*)    All other components within normal limits  BASIC METABOLIC PANEL - Abnormal; Notable for the following:    Sodium 129 (*)    Chloride 91 (*)    Glucose, Bld 528 (*)    BUN 25 (*)    GFR calc non Af Amer 84 (*)    All other components within normal limits  TROPONIN I  INFLUENZA PANEL BY PCR   Imaging Review Dg Chest 2 View  05/24/2013   CLINICAL DATA:  Cough and congestion.  EXAM: CHEST  2 VIEW  COMPARISON:  05/18/2013.  FINDINGS: The cardiac silhouette, mediastinal and hilar contours are within normal limits and stable. There are bibasilar infiltrates which are new since the prior study no pleural effusion or pulmonary edema. The bony thorax is intact.  IMPRESSION: Bibasilar infiltrates   Electronically Signed   By: Kalman Jewels M.D.   On: 05/24/2013 17:18    EKG Interpretation   None       MDM   1. CAP (community acquired pneumonia)   2. Hyperglycemia without ketosis    Pt given rocephin and zithromax IV.  Also started glucostabilizer for hyperglycemia. Pt admitted to step down, pt was  seen by Dr Rogene Houston prior to dispo.    Evalee Jefferson, PA-C 05/24/13 2347

## 2013-05-24 NOTE — ED Provider Notes (Signed)
Medical screening examination/treatment/procedure(s) were conducted as a shared visit with non-physician practitioner(s) and myself.  I personally evaluated the patient during the encounter.  EKG Interpretation   None      Results for orders placed during the hospital encounter of 05/24/13  TROPONIN I      Result Value Range   Troponin I <0.30  <0.30 ng/mL  CBC WITH DIFFERENTIAL      Result Value Range   WBC 19.2 (*) 4.0 - 10.5 K/uL   RBC 5.28  4.22 - 5.81 MIL/uL   Hemoglobin 13.7  13.0 - 17.0 g/dL   HCT 41.5  39.0 - 52.0 %   MCV 78.6  78.0 - 100.0 fL   MCH 25.9 (*) 26.0 - 34.0 pg   MCHC 33.0  30.0 - 36.0 g/dL   RDW 15.1  11.5 - 15.5 %   Platelets 311  150 - 400 K/uL   Neutrophils Relative % 77  43 - 77 %   Lymphocytes Relative 12  12 - 46 %   Monocytes Relative 11  3 - 12 %   Eosinophils Relative 0  0 - 5 %   Basophils Relative 0  0 - 1 %   Neutro Abs 14.8 (*) 1.7 - 7.7 K/uL   Lymphs Abs 2.3  0.7 - 4.0 K/uL   Monocytes Absolute 2.1 (*) 0.1 - 1.0 K/uL   Eosinophils Absolute 0.0  0.0 - 0.7 K/uL   Basophils Absolute 0.0  0.0 - 0.1 K/uL   WBC Morphology ATYPICAL LYMPHOCYTES    BASIC METABOLIC PANEL      Result Value Range   Sodium 129 (*) 137 - 147 mEq/L   Potassium 4.0  3.7 - 5.3 mEq/L   Chloride 91 (*) 96 - 112 mEq/L   CO2 23  19 - 32 mEq/L   Glucose, Bld 528 (*) 70 - 99 mg/dL   BUN 25 (*) 6 - 23 mg/dL   Creatinine, Ser 0.90  0.50 - 1.35 mg/dL   Calcium 9.2  8.4 - 10.5 mg/dL   GFR calc non Af Amer 84 (*) >90 mL/min   GFR calc Af Amer >90  >90 mL/min   Dg Chest 2 View  05/24/2013   CLINICAL DATA:  Cough and congestion.  EXAM: CHEST  2 VIEW  COMPARISON:  05/18/2013.  FINDINGS: The cardiac silhouette, mediastinal and hilar contours are within normal limits and stable. There are bibasilar infiltrates which are new since the prior study no pleural effusion or pulmonary edema. The bony thorax is intact.  IMPRESSION: Bibasilar infiltrates   Electronically Signed   By: Kalman Jewels M.D.   On: 05/24/2013 17:18   Dg Chest 2 View  05/18/2013   CLINICAL DATA:  Cough. Shortness of breath. COPD. Coronary artery disease.  EXAM: CHEST  2 VIEW  COMPARISON:  05/09/2013  FINDINGS: Heart size is within normal limits. Pulmonary hyperinflation again seen, consistent with COPD. Bilateral pleural-parenchymal scarring noted. No evidence of acute infiltrate or edema. No evidence of pleural effusion. No mass or lymphadenopathy identified.  IMPRESSION: COPD.  No active disease.   Electronically Signed   By: Earle Gell M.D.   On: 05/18/2013 16:12   Ct Angio Chest Pe W/cm &/or Wo Cm  05/09/2013   CLINICAL DATA:  Pulmonary embolism. Chest pain. Short of breath. Cough.  EXAM: CT ANGIOGRAPHY CHEST WITH CONTRAST  TECHNIQUE: Multidetector CT imaging of the chest was performed using the standard protocol during bolus administration of intravenous contrast. Multiplanar CT  image reconstructions including MIPs were obtained to evaluate the vascular anatomy.  CONTRAST:  184mL OMNIPAQUE IOHEXOL 350 MG/ML SOLN  COMPARISON:  CT chest 09/09/2004.  FINDINGS: Technically adequate study. No pulmonary embolism is present. Coronary artery atherosclerosis is present. If office based assessment of coronary risk factors has not been performed, it is now recommended. No pericardial or pleural effusion. No axillary adenopathy. Prominent hilar lymph nodes are present which are likely congestive or reactive. 1 cm right hilar node is borderline in size (image 44 series 4).  Borderline size of the ascending aorta at 39 mm, unchanged from prior. Mural plaque is present in the aortic arch. Aortic branch vessels appear patent. There is no pleural effusion. The matter [place] all and scarring in the right lung appears unchanged compared to prior exam. No pneumonia. Mild bronchial wall thickening compatible with bronchitis. Spiculated density in the left upper lobe laterally is unchanged compared to 2006, also consistent with  scarring and benign. Incidental imaging of the upper abdomen is within normal limits. Descending thoracic and abdominal aortic atherosclerosis is incidentally noted.  The pulmonary artery is enlarged, consistent with pulmonary arterial hypertension. No aggressive osseous lesions. Small radiolucent lesions in the mid thoracic spine with sclerotic margins are most compatible with Schmorl's nodes.  Review of the MIP images confirms the above findings.  IMPRESSION: 1. No acute abnormality. Negative for pulmonary embolism or acute aortic injury. 2. Enlargement of the pulmonary arteries suggesting pulmonary arterial hypertension. 3. Borderline size of right hilar lymph nodes which may be congestive or reactive.   Electronically Signed   By: Dereck Ligas M.D.   On: 05/09/2013 21:58   Dg Chest Port 1 View  05/09/2013   CLINICAL DATA:  Cough and chest pain  EXAM: PORTABLE CHEST - 1 VIEW  COMPARISON:  08/13/2012  FINDINGS: Cardiac shadow is stable. The lungs are clear bilaterally. No acute bony abnormality is seen.  IMPRESSION: No acute abnormality noted.   Electronically Signed   By: Inez Catalina M.D.   On: 05/09/2013 20:25    Patient with known COPD recent visits for exacerbations of that. Today patient the with worse breathing problems and cough x-rays consistent with bilateral pneumonia. This would be a community-acquired pneumonia. Patient is from home not from a nursing facility and has not had a admission since March. Patient's blood sugar is also in the 500 range we'll require IV fluids for that and insulin. Patient will be started on antibiotics for community-acquired pneumonia. Patient will require admission.   Patient normally on oxygen at night. Him on that his saturations are now below 90% but he is tacypnea.  Patient with a significant leukocytosis as well.  Mervin Kung, MD 05/24/13 [phone]

## 2013-05-25 ENCOUNTER — Encounter (HOSPITAL_COMMUNITY): Payer: Self-pay | Admitting: Internal Medicine

## 2013-05-25 DIAGNOSIS — J189 Pneumonia, unspecified organism: Principal | ICD-10-CM

## 2013-05-25 DIAGNOSIS — IMO0002 Reserved for concepts with insufficient information to code with codable children: Secondary | ICD-10-CM

## 2013-05-25 DIAGNOSIS — J449 Chronic obstructive pulmonary disease, unspecified: Secondary | ICD-10-CM

## 2013-05-25 DIAGNOSIS — E039 Hypothyroidism, unspecified: Secondary | ICD-10-CM

## 2013-05-25 DIAGNOSIS — E871 Hypo-osmolality and hyponatremia: Secondary | ICD-10-CM | POA: Diagnosis present

## 2013-05-25 DIAGNOSIS — E1065 Type 1 diabetes mellitus with hyperglycemia: Secondary | ICD-10-CM

## 2013-05-25 DIAGNOSIS — I517 Cardiomegaly: Secondary | ICD-10-CM

## 2013-05-25 DIAGNOSIS — E876 Hypokalemia: Secondary | ICD-10-CM | POA: Diagnosis not present

## 2013-05-25 DIAGNOSIS — I2510 Atherosclerotic heart disease of native coronary artery without angina pectoris: Secondary | ICD-10-CM

## 2013-05-25 LAB — INFLUENZA PANEL BY PCR (TYPE A & B)
H1N1 flu by pcr: NOT DETECTED
Influenza A By PCR: NEGATIVE
Influenza B By PCR: NEGATIVE

## 2013-05-25 LAB — TROPONIN I
Troponin I: <0.3 ng/mL (ref <0.30)
Troponin I: <0.3 ng/mL (ref <0.30)

## 2013-05-25 LAB — BASIC METABOLIC PANEL
BUN: 15 mg/dL (ref 6–23)
CALCIUM: 8.4 mg/dL (ref 8.4–10.5)
CO2: 25 mEq/L (ref 19–32)
CREATININE: 0.79 mg/dL (ref 0.50–1.35)
Chloride: 100 mEq/L (ref 96–112)
GFR calc Af Amer: >90 mL/min (ref >90)
GFR calc non Af Amer: 88 mL/min — ABNORMAL LOW (ref >90)
GLUCOSE: 179 mg/dL — AB (ref 70–99)
Potassium: 3.7 mEq/L (ref 3.7–5.3)
Sodium: 135 mEq/L — ABNORMAL LOW (ref 137–147)

## 2013-05-25 LAB — GLUCOSE, CAPILLARY
GLUCOSE-CAPILLARY: 408 mg/dL — AB (ref 70–99)
GLUCOSE-CAPILLARY: 389 mg/dL — AB (ref 70–99)
GLUCOSE-CAPILLARY: 151 mg/dL — AB (ref 70–99)
GLUCOSE-CAPILLARY: 152 mg/dL — AB (ref 70–99)
GLUCOSE-CAPILLARY: 167 mg/dL — AB (ref 70–99)
GLUCOSE-CAPILLARY: 232 mg/dL — AB (ref 70–99)
Glucose-Capillary: 268 mg/dL — ABNORMAL HIGH (ref 70–99)
Glucose-Capillary: 217 mg/dL — ABNORMAL HIGH (ref 70–99)
Glucose-Capillary: 143 mg/dL — ABNORMAL HIGH (ref 70–99)
Glucose-Capillary: 177 mg/dL — ABNORMAL HIGH (ref 70–99)
Glucose-Capillary: 177 mg/dL — ABNORMAL HIGH (ref 70–99)
Glucose-Capillary: 193 mg/dL — ABNORMAL HIGH (ref 70–99)
Glucose-Capillary: 96 mg/dL (ref 70–99)
Glucose-Capillary: 142 mg/dL — ABNORMAL HIGH (ref 70–99)

## 2013-05-25 LAB — COMPREHENSIVE METABOLIC PANEL
ALK PHOS: 81 U/L (ref 39–117)
ALT: 13 U/L (ref 0–53)
AST: 8 U/L (ref 0–37)
Albumin: 2.6 g/dL — ABNORMAL LOW (ref 3.5–5.2)
BUN: 19 mg/dL (ref 6–23)
CHLORIDE: 101 meq/L (ref 96–112)
CO2: 26 mEq/L (ref 19–32)
Calcium: 8.8 mg/dL (ref 8.4–10.5)
Creatinine, Ser: 0.83 mg/dL (ref 0.50–1.35)
GFR calc Af Amer: >90 mL/min (ref >90)
GFR calc non Af Amer: 86 mL/min — ABNORMAL LOW (ref >90)
GLUCOSE: 172 mg/dL — AB (ref 70–99)
POTASSIUM: 3.6 meq/L — AB (ref 3.7–5.3)
Sodium: 138 mEq/L (ref 137–147)
Total Bilirubin: 0.5 mg/dL (ref 0.3–1.2)
Total Protein: 6.4 g/dL (ref 6.0–8.3)

## 2013-05-25 LAB — HIV ANTIBODY (ROUTINE TESTING W REFLEX): HIV: NONREACTIVE

## 2013-05-25 LAB — TSH: TSH: 1.259 u[IU]/mL (ref 0.350–4.500)

## 2013-05-25 LAB — CBC
HEMATOCRIT: 35.3 % — AB (ref 39.0–52.0)
Hemoglobin: 11.7 g/dL — ABNORMAL LOW (ref 13.0–17.0)
MCH: 26.1 pg (ref 26.0–34.0)
MCHC: 33.1 g/dL (ref 30.0–36.0)
MCV: 78.6 fL (ref 78.0–100.0)
Platelets: 238 10*3/uL (ref 150–400)
RBC: 4.49 MIL/uL (ref 4.22–5.81)
RDW: 15.1 % (ref 11.5–15.5)
WBC: 13.5 10*3/uL — ABNORMAL HIGH (ref 4.0–10.5)

## 2013-05-25 LAB — MRSA PCR SCREENING: MRSA BY PCR: POSITIVE — AB

## 2013-05-25 LAB — HEMOGLOBIN A1C
Hgb A1c MFr Bld: 14 % — ABNORMAL HIGH (ref <5.7)
Mean Plasma Glucose: 355 mg/dL — ABNORMAL HIGH (ref <117)

## 2013-05-25 MED ORDER — LEVALBUTEROL HCL 0.63 MG/3ML IN NEBU
0.6300 mg | INHALATION_SOLUTION | Freq: Four times a day (QID) | RESPIRATORY_TRACT | Status: DC
  Administered 2013-05-25 – 2013-05-28 (×12): 0.63 mg via RESPIRATORY_TRACT
  Filled 2013-05-25 (×12): qty 3

## 2013-05-25 MED ORDER — INSULIN REGULAR BOLUS VIA INFUSION
0.0000 [IU] | Freq: Three times a day (TID) | INTRAVENOUS | Status: DC
  Administered 2013-05-25: 4.5 [IU] via INTRAVENOUS
  Filled 2013-05-25: qty 10

## 2013-05-25 MED ORDER — SODIUM CHLORIDE 0.9 % IV SOLN
INTRAVENOUS | Status: DC
  Administered 2013-05-25: 4.2 [IU]/h via INTRAVENOUS
  Administered 2013-05-25: 3.5 [IU]/h via INTRAVENOUS
  Administered 2013-05-25: 2.5 [IU]/h via INTRAVENOUS
  Filled 2013-05-25: qty 1

## 2013-05-25 MED ORDER — POTASSIUM CHLORIDE CRYS ER 20 MEQ PO TBCR
30.0000 meq | EXTENDED_RELEASE_TABLET | Freq: Two times a day (BID) | ORAL | Status: AC
  Administered 2013-05-25 – 2013-05-26 (×2): 30 meq via ORAL
  Filled 2013-05-25 (×4): qty 1

## 2013-05-25 MED ORDER — ACETAMINOPHEN 325 MG PO TABS: 650.0000 mg | ORAL_TABLET | Freq: Four times a day (QID) | ORAL | Status: DC | PRN

## 2013-05-25 MED ORDER — EZETIMIBE 10 MG PO TABS
10.0000 mg | ORAL_TABLET | Freq: Every day | ORAL | Status: DC
  Administered 2013-05-25 – 2013-05-28 (×4): 10 mg via ORAL
  Filled 2013-05-25 (×4): qty 1

## 2013-05-25 MED ORDER — AMLODIPINE BESYLATE 5 MG PO TABS
5.0000 mg | ORAL_TABLET | Freq: Every morning | ORAL | Status: DC
  Administered 2013-05-25 – 2013-05-28 (×4): 5 mg via ORAL
  Filled 2013-05-25 (×4): qty 1

## 2013-05-25 MED ORDER — DOCUSATE SODIUM 100 MG PO CAPS
100.0000 mg | ORAL_CAPSULE | Freq: Two times a day (BID) | ORAL | Status: DC
Start: 2013-05-25 — End: 2013-05-28
  Administered 2013-05-25 – 2013-05-28 (×7): 100 mg via ORAL
  Filled 2013-05-25 (×9): qty 1

## 2013-05-25 MED ORDER — ONDANSETRON HCL 4 MG PO TABS: 4.0000 mg | ORAL_TABLET | Freq: Four times a day (QID) | ORAL | Status: DC | PRN

## 2013-05-25 MED ORDER — DM-GUAIFENESIN ER 30-600 MG PO TB12
1.0000 | ORAL_TABLET | Freq: Two times a day (BID) | ORAL | Status: DC | PRN
  Administered 2013-05-25 – 2013-05-28 (×2): 1 via ORAL
  Filled 2013-05-25 (×2): qty 1

## 2013-05-25 MED ORDER — ALLOPURINOL 300 MG PO TABS
300.0000 mg | ORAL_TABLET | Freq: Every day | ORAL | Status: DC
  Administered 2013-05-25 – 2013-05-28 (×4): 300 mg via ORAL
  Filled 2013-05-25 (×4): qty 1

## 2013-05-25 MED ORDER — TIOTROPIUM BROMIDE MONOHYDRATE 18 MCG IN CAPS
18.0000 ug | ORAL_CAPSULE | Freq: Every day | RESPIRATORY_TRACT | Status: DC
  Administered 2013-05-25 – 2013-05-27 (×2): 18 ug via RESPIRATORY_TRACT
  Filled 2013-05-25 (×3): qty 5

## 2013-05-25 MED ORDER — DEXTROSE-NACL 5-0.45 % IV SOLN
INTRAVENOUS | Status: DC
  Administered 2013-05-25: 06:00:00 via INTRAVENOUS

## 2013-05-25 MED ORDER — INSULIN ASPART PROT & ASPART (70-30 MIX) 100 UNIT/ML ~~LOC~~ SUSP
25.0000 [IU] | Freq: Two times a day (BID) | SUBCUTANEOUS | Status: DC
  Administered 2013-05-25 – 2013-05-26 (×2): 25 [IU] via SUBCUTANEOUS
  Filled 2013-05-25 (×2): qty 10

## 2013-05-25 MED ORDER — SODIUM CHLORIDE 0.9 % IV SOLN
INTRAVENOUS | Status: DC
  Administered 2013-05-25: 01:00:00 via INTRAVENOUS

## 2013-05-25 MED ORDER — ACETAMINOPHEN 650 MG RE SUPP: 650.0000 mg | Freq: Four times a day (QID) | RECTAL | Status: DC | PRN

## 2013-05-25 MED ORDER — HYDROCODONE-ACETAMINOPHEN 5-325 MG PO TABS: 1.0000 | ORAL_TABLET | ORAL | Status: DC | PRN

## 2013-05-25 MED ORDER — MUPIROCIN 2 % EX OINT
1.0000 "application " | TOPICAL_OINTMENT | Freq: Two times a day (BID) | CUTANEOUS | Status: DC
  Administered 2013-05-25 – 2013-05-28 (×6): 1 via NASAL
  Filled 2013-05-25 (×3): qty 22

## 2013-05-25 MED ORDER — ONDANSETRON HCL 4 MG/2ML IJ SOLN: 4.0000 mg | Freq: Four times a day (QID) | INTRAMUSCULAR | Status: DC | PRN

## 2013-05-25 MED ORDER — METOPROLOL TARTRATE 50 MG PO TABS
100.0000 mg | ORAL_TABLET | Freq: Two times a day (BID) | ORAL | Status: DC
  Administered 2013-05-25 – 2013-05-28 (×7): 100 mg via ORAL
  Filled 2013-05-25 (×7): qty 2

## 2013-05-25 MED ORDER — LEVALBUTEROL HCL 0.63 MG/3ML IN NEBU: 0.6300 mg | INHALATION_SOLUTION | Freq: Four times a day (QID) | RESPIRATORY_TRACT | Status: DC | PRN

## 2013-05-25 MED ORDER — ASPIRIN EC 325 MG PO TBEC
325.0000 mg | DELAYED_RELEASE_TABLET | Freq: Every morning | ORAL | Status: DC
  Administered 2013-05-25 – 2013-05-28 (×4): 325 mg via ORAL
  Filled 2013-05-25 (×4): qty 1

## 2013-05-25 MED ORDER — LISINOPRIL 10 MG PO TABS
40.0000 mg | ORAL_TABLET | Freq: Every day | ORAL | Status: DC
  Administered 2013-05-25 – 2013-05-28 (×4): 40 mg via ORAL
  Filled 2013-05-25 (×4): qty 4

## 2013-05-25 MED ORDER — POTASSIUM CHLORIDE IN NACL 20-0.9 MEQ/L-% IV SOLN
INTRAVENOUS | Status: DC
  Administered 2013-05-25 – 2013-05-26 (×2): via INTRAVENOUS

## 2013-05-25 MED ORDER — DEXTROSE 5 % IV SOLN
1.0000 g | INTRAVENOUS | Status: DC
  Administered 2013-05-25 – 2013-05-26 (×2): 1 g via INTRAVENOUS
  Filled 2013-05-25 (×2): qty 10

## 2013-05-25 MED ORDER — DEXTROSE 5 % IV SOLN
500.0000 mg | INTRAVENOUS | Status: DC
  Administered 2013-05-25 – 2013-05-27 (×2): 500 mg via INTRAVENOUS
  Filled 2013-05-25 (×2): qty 500

## 2013-05-25 MED ORDER — SODIUM CHLORIDE 0.9 % IJ SOLN
3.0000 mL | Freq: Two times a day (BID) | INTRAMUSCULAR | Status: DC
  Administered 2013-05-25 – 2013-05-28 (×6): 3 mL via INTRAVENOUS

## 2013-05-25 MED ORDER — ENOXAPARIN SODIUM 40 MG/0.4ML ~~LOC~~ SOLN
40.0000 mg | SUBCUTANEOUS | Status: DC
  Administered 2013-05-25 – 2013-05-28 (×4): 40 mg via SUBCUTANEOUS
  Filled 2013-05-25 (×4): qty 0.4

## 2013-05-25 MED ORDER — LEVOTHYROXINE SODIUM 75 MCG PO TABS
75.0000 ug | ORAL_TABLET | Freq: Every day | ORAL | Status: DC
  Administered 2013-05-25 – 2013-05-28 (×4): 75 ug via ORAL
  Filled 2013-05-25 (×7): qty 1

## 2013-05-25 MED ORDER — DEXTROSE 50 % IV SOLN: 25.0000 mL | INTRAVENOUS | Status: DC | PRN

## 2013-05-25 MED ORDER — CHLORHEXIDINE GLUCONATE CLOTH 2 % EX PADS
6.0000 | MEDICATED_PAD | Freq: Every day | CUTANEOUS | Status: DC
  Administered 2013-05-26 – 2013-05-28 (×3): 6 via TOPICAL

## 2013-05-25 NOTE — Plan of Care (Signed)
Problem: Phase I Progression Outcomes Goal: Dyspnea controlled at rest Outcome: Progressing RR elevated at 30, dyspnea with exertion, Oxygen saturation at 91-92% on room air sleeping.   Goal: Pain controlled with appropriate interventions Outcome: Progressing No complaints of pain at present Goal: First antibiotic given within 6hrs of admit Outcome: Completed/Met Date Met:  05/25/13 Rocephin and azithromycin given in ED Goal: Code status addressed with pt/family Outcome: Completed/Met Date Met:  05/25/13 This patient is a full code Goal: Initial discharge plan identified Outcome: Progressing To home with spouse Goal: Voiding-avoid urinary catheter unless indicated Outcome: Completed/Met Date Met:  05/25/13 Using urinal

## 2013-05-25 NOTE — ED Notes (Signed)
Floor unable to take report at this time.

## 2013-05-25 NOTE — Progress Notes (Signed)
Noted patient admitted with Pneumonia and hyperglycemia with initial lab glucose of 528 mg/dl on 05/24/13 at 18:17.  Therefore patient was started on an insulin drip and currently remains on an insulin drip.  Note last BMET was at 6:12 this morning. Blood glucose over the past 9 hours has ranged from 96-193 mg/dl. Recommend ordering BMET.  Talked with Legrand Como, RN to discuss plan for patient and request he talk with Dr. Caryn Section about potentially transitioning from IV insulin to SQ insulin and request BMET.    Noted in H&P that patient has not been taking medications for diabetes.  Called patient to discuss barriers to taking DM meds but he was asleep.  Talked with patient's wife [name]) and she reports that patient has Medicare but he has to pay about $200 out of pocket each time he gets his insulin and they are having difficulty with affording the insulin.  At time of transition to SQ insulin, MD may want to consider ordering 70/30 as a basal insulin.  If 70/30 is ordered, the equivalent of Lantus 50 units daily would be 70/30 44 units BID.  Novolin 70/30, Novolin N, and Novolin R can be purchased at [facility] for $25 per vial which would be more affordable for patient.  Informed [name] there are more affordable insulin options if cost is an issue and it would be discussed with MD and Nursing staff.  Called Tammy, RN, CM to make aware of noncompliance with DM meds due to cost.   Will continue to follow.  Thanks, Barnie Alderman, RN, MSN, CCRN Diabetes Coordinator [facility]patient [facility] [phone] (Team Pager) [phone] ([facility] office) [phone] [facility] office)

## 2013-05-25 NOTE — ED Notes (Signed)
Pt reports "not feeling well" x2 weeks - pt admits to dry, non-productive cough, fever and body aches, states he came in tonight b/c "his wife did not give him a choice" - at present pt denies any pain - pt also admits that he does not check his blood sugar nor is he compliant w/ his DM medications d/t having gout.

## 2013-05-25 NOTE — Progress Notes (Signed)
The patient is a 73 year old man with COPD, coronary artery disease, and diabetes, who was admitted this morning for uncontrolled diabetes and community-acquired pneumonia. He was briefly seen. His vital signs, laboratory studies, and chart were reviewed. He is status post insulin drip. His blood glucose is now less than 100. He cannot afford Lantus which led to his noncompliance, in part. We'll therefore start 70/30 insulin in the hospital which would be less expensive for him when he is discharged. We'll continue antibiotics for treatment of pneumonia and Xopenex for COPD. Will add potassium chloride supplementation. Of note, his influenza panel is negative. His troponin I is negative x3. His hemoglobin A1c is 14.0. His TSH is within normal limits at 1.25.

## 2013-05-25 NOTE — H&P (Signed)
[facility] History and Physical  DAEGEN BERROCAL DDU:[id number] DOB: 06-05-1940 DOA: 05/24/2013   PCP: Tula Nakayama, MD  Specialists: Dr. Luan Pulling is his pulmonologist.  Chief Complaint: Shortness of breath, weakness, cough  HPI: [name] is a 73 y.o. male with a past medical history of COPD, coronary artery disease, diabetes, poorly controlled, who was in his usual state of health about 3 weeks ago, when he started having weakness, and a dry cough. He denies any fever or chills. No nausea, vomiting. No headaches. He has had some chest pain, especially with coughing, but none currently. Has had some shortness of breath. Denies any leg swelling. No diarrhea. No urinary complaints. He did not get a flu shot this season. His family was sick with upper respiratory infection during Christmas time. He is unsure of his pneumonia vaccine status. This is his third visit to the emergency department in the last 3 weeks with similar complaints.  Home Medications: Prior to Admission medications   Medication Sig Start Date End Date Taking? Authorizing Provider  allopurinol (ZYLOPRIM) 300 MG tablet Take 300 mg by mouth daily. 05/02/13  Yes Historical Provider, MD  amLODipine (NORVASC) 5 MG tablet Take 1 tablet (5 mg total) by mouth every morning. 04/20/13 04/20/14 Yes Fayrene Helper, MD  aspirin EC 325 MG tablet Take 325 mg by mouth every morning.    Yes Historical Provider, MD  Choline Fenofibrate (TRILIPIX) 135 MG capsule Take 135 mg by mouth daily.   Yes Historical Provider, MD  ezetimibe (ZETIA) 10 MG tablet Take 1 tablet (10 mg total) by mouth daily. 04/22/13 04/22/14 Yes Fayrene Helper, MD  HYDROcodone-acetaminophen (NORCO/VICODIN) 5-325 MG per tablet Take 1 tablet by mouth every 6 (six) hours as needed. 05/02/13  Yes Historical Provider, MD  insulin glargine (LANTUS) 100 units/mL SOLN Inject 50 Units into the skin at bedtime.    Yes Historical Provider, MD  insulin lispro (HUMALOG  KWIKPEN) 100 UNIT/ML injection Inject 1-20 Units into the skin 3 (three) times daily before meals. Sliding scale   Yes Historical Provider, MD  levothyroxine (SYNTHROID, LEVOTHROID) 75 MCG tablet Take 75 mcg by mouth every morning.    Yes Historical Provider, MD  lisinopril (PRINIVIL,ZESTRIL) 40 MG tablet Take 1 tablet (40 mg total) by mouth daily. 04/20/13 04/20/14 Yes Fayrene Helper, MD  metFORMIN (GLUCOPHAGE) 500 MG tablet Take 1,000 mg by mouth 2 (two) times daily. 06/11/12  Yes Fayrene Helper, MD  metoprolol (LOPRESSOR) 100 MG tablet Take 1 tablet (100 mg total) by mouth 2 (two) times daily. 06/11/12  Yes Fayrene Helper, MD  naproxen (NAPROSYN) 500 MG tablet Take 500 mg by mouth 2 (two) times daily. 05/02/13  Yes Historical Provider, MD  OXYGEN-HELIUM IN Inhale 2 L into the lungs at bedtime.    Yes Historical Provider, MD  predniSONE (STERAPRED UNI-PAK) 10 MG tablet Take by mouth daily. 6 tablets (25m) day one, 559mday 2, 4049may 3, 30 mg day 4, 27m39my 5, 10 mg day 6 05/18/13  Yes StepVirgel Manifold  terbinafine (LAMISIL) 250 MG tablet Take 1 tablet (250 mg total) by mouth daily. 05/23/13 08/21/13 Yes MargFayrene Helper  tiotropium (SPIRIVA) 18 MCG inhalation capsule Place 1 capsule (18 mcg total) into inhaler and inhale daily. 05/18/13  Yes StepVirgel Manifold  albuterol (PROVENTIL HFA;VENTOLIN HFA) 108 (90 BASE) MCG/ACT inhaler Inhale 1 puff into the lungs every 6 (six) hours as needed for wheezing or shortness of breath.  Historical Provider, MD    Allergies: No Known Allergies  Past Medical History: Past Medical History  Diagnosis Date  . Arthritis of knee   . GERD (gastroesophageal reflux disease)   . CAD (coronary artery disease)   . Obesity   . Hyperlipidemia   . Diabetes mellitus, type 1   . Hypertension   . Chronic back pain   . Gout   . Chronic bronchitis   . Psoriasis   . COPD (chronic obstructive pulmonary disease)   . Requires supplemental oxygen     2LPM      Past Surgical History  Procedure Laterality Date  . Appendectomy    . Stent in heart  2009  . Cardiac catheterization      3or 4 stents  . Colonoscopy  03/17/2012    Procedure: COLONOSCOPY;  Surgeon: Rogene Houston, MD;  Location: [facility];  Service: Endoscopy;  Laterality: N/A;  830  . Cystoscopy with urethral dilatation N/A 11/26/2012    Procedure: CYSTOSCOPY WITH URETHRAL DILATATION;  Surgeon: Malka So, MD;  Location: [facility] ORS;  Service: Urology;  Laterality: N/A;    Social History: Patient lives in [place] with his wife. He quit smoking 20 years ago. Denies any alcohol use or illicit drug use.  Family History:  Family History  Problem Relation Age of Onset  . Hypertension Mother   . Diabetes Mother   . Hypertension Father   . Diabetes Brother   . Arthritis      Family History   . Diabetes      family History      Review of Systems - History obtained from the patient General ROS: positive for  - fatigue Psychological ROS: negative Ophthalmic ROS: negative ENT ROS: negative Allergy and Immunology ROS: negative Hematological and Lymphatic ROS: negative Endocrine ROS: negative Respiratory ROS: as in hpi Cardiovascular ROS: no chest pain or dyspnea on exertion Gastrointestinal ROS: no abdominal pain, change in bowel habits, or black or bloody stools Genito-Urinary ROS: no dysuria, trouble voiding, or hematuria Musculoskeletal ROS: negative Neurological ROS: no TIA or stroke symptoms Dermatological ROS: negative  Physical Examination  Filed Vitals:   05/24/13 1704 05/24/13 2129 05/25/13 0012  BP: 139/76 106/61 154/73  Pulse: 98 88 129  Temp: 99 F (37.2 C) 98.4 F (36.9 C)   TempSrc: Oral Oral   Resp: _0 Height: _1  (1.803 m)    Weight: 100.699 kg (222 lb)    SpO2: 95% 94% 94%    General appearance: alert, cooperative, appears stated age and no distress Head: Normocephalic, without obvious abnormality, atraumatic Eyes:  conjunctivae/corneas clear. PERRL, EOM's intact.  Throat: lips, mucosa, and tongue normal; teeth and gums normal Neck: no adenopathy, no carotid bruit, no JVD, supple, symmetrical, trachea midline and thyroid not enlarged, symmetric, no tenderness/mass/nodules Resp: Entry at the bases with a few crackles. No definite wheezing Cardio: S1-S2 is tachycardic. Regular. No S3, S4. No rubs, murmurs, or bruit. Pedal edema is appreciated GI: soft, non-tender; bowel sounds normal; no masses,  no organomegaly Extremities: 1+ pitting edema bilateral lower extremities. Pulses: 2+ and symmetric Skin: Psoriatic lesions appreciated over both knees Lymph nodes: Cervical, supraclavicular, and axillary nodes normal. Neurologic: No focal deficits  Laboratory Data: Results for orders placed during the hospital encounter of 05/24/13 (from the past 48 hour(s))  TROPONIN I     Status: None   Collection Time    05/24/13  6:17 PM      Result  Value Range   Troponin I <0.30  <0.30 ng/mL   Comment:            Due to the release kinetics of cTnI,     a negative result within the first hours     of the onset of symptoms does not rule out     myocardial infarction with certainty.     If myocardial infarction is still suspected,     repeat the test at appropriate intervals.  CBC WITH DIFFERENTIAL     Status: Abnormal   Collection Time    05/24/13  6:17 PM      Result Value Range   WBC 19.2 (*) 4.0 - 10.5 K/uL   RBC 5.28  4.22 - 5.81 MIL/uL   Hemoglobin 13.7  13.0 - 17.0 g/dL   HCT 41.5  39.0 - 52.0 %   MCV 78.6  78.0 - 100.0 fL   MCH 25.9 (*) 26.0 - 34.0 pg   MCHC 33.0  30.0 - 36.0 g/dL   RDW 15.1  11.5 - 15.5 %   Platelets 311  150 - 400 K/uL   Neutrophils Relative % 77  43 - 77 %   Lymphocytes Relative 12  12 - 46 %   Monocytes Relative 11  3 - 12 %   Eosinophils Relative 0  0 - 5 %   Basophils Relative 0  0 - 1 %   Neutro Abs 14.8 (*) 1.7 - 7.7 K/uL   Lymphs Abs 2.3  0.7 - 4.0 K/uL   Monocytes  Absolute 2.1 (*) 0.1 - 1.0 K/uL   Eosinophils Absolute 0.0  0.0 - 0.7 K/uL   Basophils Absolute 0.0  0.0 - 0.1 K/uL   WBC Morphology ATYPICAL LYMPHOCYTES    BASIC METABOLIC PANEL     Status: Abnormal   Collection Time    05/24/13  6:17 PM      Result Value Range   Sodium 129 (*) 137 - 147 mEq/L   Potassium 4.0  3.7 - 5.3 mEq/L   Chloride 91 (*) 96 - 112 mEq/L   CO2 23  19 - 32 mEq/L   Glucose, Bld 528 (*) 70 - 99 mg/dL   BUN 25 (*) 6 - 23 mg/dL   Creatinine, Ser 0.90  0.50 - 1.35 mg/dL   Calcium 9.2  8.4 - 10.5 mg/dL   GFR calc non Af Amer 84 (*) >90 mL/min   GFR calc Af Amer >90  >90 mL/min   Comment: (NOTE)     The eGFR has been calculated using the CKD EPI equation.     This calculation has not been validated in all clinical situations.     eGFR's persistently <90 mL/min signify possible Chronic Kidney     Disease.    Radiology Reports: Dg Chest 2 View  05/24/2013   CLINICAL DATA:  Cough and congestion.  EXAM: CHEST  2 VIEW  COMPARISON:  05/18/2013.  FINDINGS: The cardiac silhouette, mediastinal and hilar contours are within normal limits and stable. There are bibasilar infiltrates which are new since the prior study no pleural effusion or pulmonary edema. The bony thorax is intact.  IMPRESSION: Bibasilar infiltrates   Electronically Signed   By: Kalman Jewels M.D.   On: 05/24/2013 17:18    Electrocardiogram: EKG shows sinus tachycardia 124 beats per minute. Left axis deviation is noted. Nonspecific T wave, changes. No concerning ST changes. Similar to old EKG  Problem List  Principal Problem:   CAP (  community acquired pneumonia) Active Problems:   Diabetes mellitus, insulin dependent (IDDM), uncontrolled   CAD   PSORIASIS   COPD (chronic obstructive pulmonary disease)   Assessment: This is a 73 year old, African American male, who presents with cough, shortness of breath, worsening over the last few days. Chest x-ray suggests bibasilar infiltrates. This is most likely  community-acquired pneumonia. Influenza is another possibility though less likely.  Plan: #1 community-acquired pneumonia with a history of COPD: He'll be treated with ceftriaxone and azithromycin. Oxygen will be continued. Urine for strep and Legionella antigens will be obtained. Nebulizers as needed  #2 sinus tachycardia: Probably from acute infection and acute illness, along with hyperglycemia. Monitor on telemetry. Get TSH.  #3 diabetes mellitus, type II, uncontrolled, with hyperglycemia: He states that he hasn't taken his diabetes medications in many days. He cannot give a good reason for his noncompliance. He will be placed on insulin infusion and then will be transitioned over to his Lantus. I have explained to him the importance of being compliant with his medications. HbA1c will be checked.  #4 peripheral edema: No obvious reason for same. No erythema is noted. No calf tenderness We'll get an echocardiogram. TSH will be checked.  #5 history of coronary artery disease: Appears to be stable. Troponin is normal. Continue with his home medications  He has pseudohyponatremia due to hyperglycemia DVT Prophylaxis: Lovenox Code Status: Full code Family Communication: Discussed with the patient  Disposition Plan: Admit to step down   Further management decisions will depend on results of further testing and patient's response to treatment.  [facility]  [facility] Pager [phone]  If 7PM-7AM, please contact night-coverage [email or web address] Password [facility]  05/25/2013, 12:34 AM

## 2013-05-25 NOTE — Progress Notes (Signed)
*  PRELIMINARY RESULTS* Echocardiogram 2D Echocardiogram has been performed.  [place], [place] 05/25/2013, 4:42 PM

## 2013-05-25 NOTE — Progress Notes (Signed)
[facility] chart review completed.  

## 2013-05-26 DIAGNOSIS — R7309 Other abnormal glucose: Secondary | ICD-10-CM

## 2013-05-26 LAB — GLUCOSE, CAPILLARY
GLUCOSE-CAPILLARY: 231 mg/dL — AB (ref 70–99)
GLUCOSE-CAPILLARY: 231 mg/dL — AB (ref 70–99)
Glucose-Capillary: 260 mg/dL — ABNORMAL HIGH (ref 70–99)
Glucose-Capillary: 208 mg/dL — ABNORMAL HIGH (ref 70–99)
Glucose-Capillary: 169 mg/dL — ABNORMAL HIGH (ref 70–99)

## 2013-05-26 LAB — BASIC METABOLIC PANEL
BUN: 11 mg/dL (ref 6–23)
CHLORIDE: 98 meq/L (ref 96–112)
CO2: 21 mEq/L (ref 19–32)
Calcium: 8.4 mg/dL (ref 8.4–10.5)
Creatinine, Ser: 0.79 mg/dL (ref 0.50–1.35)
GFR calc Af Amer: >90 mL/min (ref >90)
GFR calc non Af Amer: 88 mL/min — ABNORMAL LOW (ref >90)
Glucose, Bld: 240 mg/dL — ABNORMAL HIGH (ref 70–99)
Potassium: 4.5 mEq/L (ref 3.7–5.3)
Sodium: 132 mEq/L — ABNORMAL LOW (ref 137–147)

## 2013-05-26 LAB — LEGIONELLA ANTIGEN, URINE: LEGIONELLA ANTIGEN, URINE: NEGATIVE

## 2013-05-26 LAB — STREP PNEUMONIAE URINARY ANTIGEN: STREP PNEUMO URINARY ANTIGEN: NEGATIVE

## 2013-05-26 MED ORDER — INSULIN ASPART PROT & ASPART (70-30 MIX) 100 UNIT/ML ~~LOC~~ SUSP
35.0000 [IU] | Freq: Two times a day (BID) | SUBCUTANEOUS | Status: DC
  Administered 2013-05-26 – 2013-05-28 (×4): 35 [IU] via SUBCUTANEOUS
  Filled 2013-05-26: qty 10

## 2013-05-26 NOTE — Progress Notes (Signed)
[facility] PROGRESS NOTE  [name] FBP:[id number] DOB: [birth year] DOA: 05/24/2013 PCP: Tula Nakayama, MD  Assessment/Plan: 1. Community acquired pneumonia. Clinically improving. 2. Diabetes mellitus type 2, uncontrolled with hyperglycemia on admission. Out of insulin secondary to financial reasons. Sugars now stable. 3. History of COPD, chronic respiratory failure on oxygen at night at home. Stable. 4. History of coronary artery disease. Appears to be asymptomatic. 5. Noncompliance with insulin secondary to financial reasons. Insulin 70/30 on discharge. 6. History of gout 7. Constipation   Continue empiric antibiotic therapy.  Increase 70/30 insulin to 35 units BID  Transfer to telemetry  Likely home in 48 hours   Code Status: full code DVT prophylaxis: Lovenox Family Communication: none present Disposition Plan: home   [name], MD  [facility]  Pager [phone] If 7PM-7AM, please contact night-coverage at [email or web address], password [facility] 05/26/2013, 9:49 AM  LOS: 2 days   Summary: 73 year old man presented with cough, shortness of breath. Admitted for community-acquired pneumonia.  Consultants:    Procedures:  2-D echocardiogram: LVEF 61-44%, grade 1 diastolic dysfunction  Antibiotics:  Zithromax 1/7 >>   Ceftriaxone 1/7 >>   HPI/Subjective: Overall feels better. Breathing better.  Objective: Filed Vitals:   05/26/13 0700 05/26/13 0730 05/26/13 0736 05/26/13 0800  BP: 122/50   136/69  Pulse: 85   87  Temp:  98.7 F (37.1 C)    TempSrc:  Oral    Resp: 21   25  Height:      Weight:      SpO2: 96%  93% 93%    Intake/Output Summary (Last 24 hours) at 05/26/13 0949 Last data filed at 05/26/13 0100  Gross per 24 hour  Intake  611.8 ml  Output    900 ml  Net -288.2 ml     Filed Weights   05/24/13 1704  Weight: 100.699 kg (222 lb)    Exam:   Afebrile, hemodynamically stable. Hypoxic on 2 L, 93%. Tachypnea mid  20s.  General: Appears calm and comfortable. Speech fluent and clear.  Cardiovascular: Regular rate and rhythm. No murmur, rub or gallop. No lower extremity edema.  Telemetry: SR, 9 beat VT  Respiratory: Clear to auscultation bilaterally. No wheezes, rales rhonchi. Normal respiratory effort.  Data Reviewed:  Capillary blood sugars stable  Basic metabolic panel unremarkable  Troponins negative CBC in a.m.  Hemoglobin A1c 14. TSH normal.  Influenza screen negative.  CXR bibasilar infiltrates   Scheduled Meds: . allopurinol  300 mg Oral Daily  . amLODipine  5 mg Oral q morning - 10a  . aspirin EC  325 mg Oral q morning - 10a  . azithromycin  500 mg Intravenous Q24H  . cefTRIAXone (ROCEPHIN)  IV  1 g Intravenous Q24H  . Chlorhexidine Gluconate Cloth  6 each Topical Q0600  . docusate sodium  100 mg Oral BID  . enoxaparin (LOVENOX) injection  40 mg Subcutaneous Q24H  . ezetimibe  10 mg Oral Daily  . insulin aspart protamine- aspart  25 Units Subcutaneous BID WC  . levalbuterol  0.63 mg Nebulization Q6H  . levothyroxine  75 mcg Oral QAC breakfast  . lisinopril  40 mg Oral Daily  . metoprolol  100 mg Oral BID  . mupirocin ointment  1 application Nasal BID  . potassium chloride  30 mEq Oral BID  . sodium chloride  3 mL Intravenous Q12H  . tiotropium  18 mcg Inhalation Daily   Continuous Infusions: . 0.9 % NaCl with KCl 20 mEq /  L 75 mL/hr at 05/26/13 0100    Principal Problem:   CAP (community acquired pneumonia) Active Problems:   Diabetes mellitus, insulin dependent (IDDM), uncontrolled   CAD   PSORIASIS   Hypothyroid   COPD (chronic obstructive pulmonary disease)   Hypokalemia   Hyponatremia   Time spent 25 minutes

## 2013-05-26 NOTE — Progress Notes (Addendum)
Nutrition Brief Note  Patient identified on the Malnutrition Screening Tool (MST) Report  Wt Readings from Last 15 Encounters:  05/24/13 222 lb (100.699 kg)  05/18/13 229 lb (103.874 kg)  05/18/13 229 lb 1.9 oz (103.928 kg)  05/09/13 237 lb (107.502 kg)  04/20/13 238 lb 1.9 oz (108.011 kg)  11/22/12 240 lb (108.863 kg)  11/12/12 243 lb (110.224 kg)  11/12/12 243 lb (110.224 kg)  11/08/12 243 lb (110.224 kg)  09/28/12 243 lb 1.9 oz (110.279 kg)  09/17/12 240 lb (108.863 kg)  08/17/12 234 lb (106.142 kg)  08/13/12 226 lb 10.1 oz (102.8 kg)  06/09/12 233 lb (105.688 kg)  03/17/12 229 lb (103.874 kg)   Chart review reveals a 6.7% wt loss x 1 month and a 2.2% wt loss x 1 week, both of which are clinically significant. Wt loss likely due to uncontrolled diabetes. Pt noncompliant with insulin regimen due to financial reasons. Hgb A1c: 14.0. Attempted edu, however, pt asleep at time of visit and unable to arouse.  Body mass index is 31.85 kg/(m^2). Patient meets criteria for obesity, class I based on current BMI.   Current diet order is carb modified, patient is consuming approximately 75% of meals at this time. Labs and medications reviewed.   No nutrition interventions warranted at this time. If nutrition issues arise, please consult RD.   Arrick Dutton A. Jimmye Norman, RD, LDN Pager: [phone]

## 2013-05-26 NOTE — Progress Notes (Signed)
CPAP machine pulled from pt room because of patient refusal to wear because of full face mask and not willing to try to wear nasal mask. Michela Pitcher he is use to wearing the nasal prongs and no need in bringing anything else in.

## 2013-05-26 NOTE — Progress Notes (Signed)
Inpatient Diabetes Program Recommendations  AACE/ADA: New Consensus Statement on Inpatient Glycemic Control (2013)  Target Ranges:  Prepandial:   less than 140 mg/dL      Peak postprandial:   less than 180 mg/dL (1-2 hours)      Critically ill patients:  140 - 180 mg/dL  Results for CAROLD, EISNER (MRN [id number]) as of 05/26/2013 07:56  Ref. Range 05/25/2013 12:45 05/25/2013 14:26 05/25/2013 15:41 05/25/2013 17:43 05/26/2013 03:07  Glucose-Capillary Latest Range: 70-99 mg/dL 167 (H) 96 142 (H) 232 (H) 260 (H)   Inpatient Diabetes Program Recommendations Insulin - Basal: Please consider increasing 70/30 to 35 units BID. Correction (SSI): Please consider also ordering CBGs with Novolog correction ACHS.  Thanks, Barnie Alderman, RN, MSN, CCRN Diabetes Coordinator Inpatient Diabetes Program [phone] (Team Pager) [phone] (AP office) [phone] [facility] office)

## 2013-05-27 ENCOUNTER — Encounter (HOSPITAL_COMMUNITY): Payer: Self-pay

## 2013-05-27 LAB — BASIC METABOLIC PANEL
BUN: 10 mg/dL (ref 6–23)
CALCIUM: 8.7 mg/dL (ref 8.4–10.5)
CO2: 23 mEq/L (ref 19–32)
Chloride: 99 mEq/L (ref 96–112)
Creatinine, Ser: 0.81 mg/dL (ref 0.50–1.35)
GFR, EST NON AFRICAN AMERICAN: 87 mL/min — AB (ref >90)
Glucose, Bld: 101 mg/dL — ABNORMAL HIGH (ref 70–99)
Potassium: 4.1 mEq/L (ref 3.7–5.3)
Sodium: 136 mEq/L — ABNORMAL LOW (ref 137–147)

## 2013-05-27 LAB — MAGNESIUM: Magnesium: 1.9 mg/dL (ref 1.5–2.5)

## 2013-05-27 LAB — GLUCOSE, CAPILLARY
GLUCOSE-CAPILLARY: 126 mg/dL — AB (ref 70–99)
Glucose-Capillary: 153 mg/dL — ABNORMAL HIGH (ref 70–99)
Glucose-Capillary: 160 mg/dL — ABNORMAL HIGH (ref 70–99)

## 2013-05-27 MED ORDER — CEFUROXIME AXETIL 250 MG PO TABS
500.0000 mg | ORAL_TABLET | Freq: Two times a day (BID) | ORAL | Status: DC
  Administered 2013-05-27 – 2013-05-28 (×2): 500 mg via ORAL
  Filled 2013-05-27 (×2): qty 2

## 2013-05-27 MED ORDER — AZITHROMYCIN 250 MG PO TABS
500.0000 mg | ORAL_TABLET | Freq: Every day | ORAL | Status: DC
  Administered 2013-05-27: 500 mg via ORAL
  Filled 2013-05-27: qty 2

## 2013-05-27 NOTE — Care Management Note (Signed)
    Page 1 of 1   05/27/2013     1:34:11 PM   CARE MANAGEMENT NOTE 05/27/2013  Patient:  [name], [name]   Account Number:  [id number]  Date Initiated:  05/27/2013  Documentation initiated by:  Theophilus Kinds  Subjective/Objective Assessment:   Pt admitted from home with pneumonia and hyperglycemia. Pt lives with his wife and will return home at discharge. Pt has expressed concerns about affording insulin and MD aware. Pt has home O2 with [employer]. Pt is independent with ADL's.     Action/Plan:   MD has changed insulin to something pt can afford. No other CM needs noted.   Anticipated DC Date:  05/29/2013   Anticipated DC Plan:  [place]  CM consult      Choice offered to / List presented to:             Status of service:  Completed, signed off Medicare Important Message given?   (If response is "NO", the following Medicare IM given date fields will be blank) Date Medicare IM given:   Date Additional Medicare IM given:    Discharge Disposition:  HOME/SELF CARE  Per UR Regulation:    If discussed at Long Length of Stay Meetings, dates discussed:    Comments:  05/27/13 [place], RN BSN CM

## 2013-05-27 NOTE — Progress Notes (Signed)
[facility] PROGRESS NOTE  [name] MBW:[id number] DOB: April 22, 1941 DOA: 05/24/2013 PCP: Tula Nakayama, MD  Assessment/Plan: 1. Community acquired pneumonia. Rapidly improving, hypoxia has resolved. 2. Acute hypoxic respiratory failure, resolved with treatment of pneumonia. 3. Diabetes mellitus type 2, uncontrolled with hyperglycemia on admission. Secondary to financial reasons, patient could not afford Lantus. Blood sugars now stable. Continue insulin 70/30. 4. History of COPD, chronic respiratory failure on oxygen at night. Stable. 5. History of coronary artery disease. Asymptomatic. 6. Noncompliance with insulin secondary to financial reasons.   Change to oral antibiotics.  Continue to monitor blood sugars, no change in insulin today. Will discharge home on insulin 70/30.   Continue oxygen at night.   Discontinue telemetry.  Anticipate discharge 1/10.   Code Status: full code DVT prophylaxis: Lovenox Family Communication: none present Disposition Plan: home   [name], MD  [facility]  Pager [phone] If 7PM-7AM, please contact night-coverage at [email or web address], password [facility] 05/27/2013, 9:33 AM  LOS: 3 days   Summary: 73 year old man presented with cough, shortness of breath. Admitted for community-acquired pneumonia.  Consultants:    Procedures:  2-D echocardiogram: LVEF 03-00%, grade 1 diastolic dysfunction  Antibiotics:  Zithromax 1/7 >>   Ceftriaxone 1/7 >>   HPI/Subjective: No issues overnight per RN. The patient feels better. Breathing better. Patient refuses CPAP  Objective: Filed Vitals:   05/27/13 0300 05/27/13 0400 05/27/13 0500 05/27/13 0856  BP: 142/72 122/45 149/70   Pulse: 76 75 78   Temp:  99.2 F (37.3 C)    TempSrc:      Resp:      Height:      Weight:   102 kg (224 lb 13.9 oz)   SpO2: 93% 91% 94% 91%    Intake/Output Summary (Last 24 hours) at 05/27/13 0933 Last data filed at 05/27/13 0200  Gross per  24 hour  Intake     75 ml  Output   1000 ml  Net   -925 ml     Filed Weights   05/24/13 1704 05/27/13 0500  Weight: 100.699 kg (222 lb) 102 kg (224 lb 13.9 oz)    Exam:   Afebrile, vital signs stable. Hypoxia resolved.  General: Appears calm and comfortable.  Cardiovascular: Regular rate and rhythm. No murmur, rub or gallop.  Respiratory: Improved air movement. Some rhonchi persist. No wheezes or rales. Normal respiratory effort.  Psychiatric: Grossly normal mood and affect. Speech fluent and clear.  Data Reviewed:  Capillary blood sugars stable. Fasting blood sugar 101.  Basic metabolic panel unremarkable  Scheduled Meds: . allopurinol  300 mg Oral Daily  . amLODipine  5 mg Oral q morning - 10a  . aspirin EC  325 mg Oral q morning - 10a  . azithromycin  500 mg Intravenous Q24H  . cefTRIAXone (ROCEPHIN)  IV  1 g Intravenous Q24H  . Chlorhexidine Gluconate Cloth  6 each Topical Q0600  . docusate sodium  100 mg Oral BID  . enoxaparin (LOVENOX) injection  40 mg Subcutaneous Q24H  . ezetimibe  10 mg Oral Daily  . insulin aspart protamine- aspart  35 Units Subcutaneous BID WC  . levalbuterol  0.63 mg Nebulization Q6H  . levothyroxine  75 mcg Oral QAC breakfast  . lisinopril  40 mg Oral Daily  . metoprolol  100 mg Oral BID  . mupirocin ointment  1 application Nasal BID  . sodium chloride  3 mL Intravenous Q12H  . tiotropium  18 mcg Inhalation Daily  Continuous Infusions:    Principal Problem:   CAP (community acquired pneumonia) Active Problems:   Diabetes mellitus, insulin dependent (IDDM), uncontrolled   CAD   PSORIASIS   Hypothyroid   COPD (chronic obstructive pulmonary disease)   Hypokalemia   Hyponatremia   Time spent 20 minutes

## 2013-05-27 NOTE — Progress Notes (Signed)
Report given to [facility] Patient transferred to 325 in stable condition via wheelchair.

## 2013-05-28 LAB — GLUCOSE, CAPILLARY
GLUCOSE-CAPILLARY: 113 mg/dL — AB (ref 70–99)
Glucose-Capillary: 109 mg/dL — ABNORMAL HIGH (ref 70–99)
Glucose-Capillary: 153 mg/dL — ABNORMAL HIGH (ref 70–99)

## 2013-05-28 MED ORDER — INSULIN NPH ISOPHANE & REGULAR (70-30) 100 UNIT/ML ~~LOC~~ SUSP: 30.0000 [IU] | Freq: Two times a day (BID) | SUBCUTANEOUS | Status: DC

## 2013-05-28 MED ORDER — AZITHROMYCIN 250 MG PO TABS: 250.0000 mg | ORAL_TABLET | Freq: Every day | ORAL | Status: DC

## 2013-05-28 MED ORDER — CEFUROXIME AXETIL 500 MG PO TABS: 500.0000 mg | ORAL_TABLET | Freq: Two times a day (BID) | ORAL | Status: DC

## 2013-05-28 NOTE — Progress Notes (Signed)
[facility] PROGRESS NOTE  [name]:[id number] DOB: [birth year] DOA: 05/24/2013 PCP: Tula Nakayama, MD  Assessment/Plan: 1. Community acquired pneumonia. Appears clinically resolved, hypoxia has resolved. Influenza negative.  2. Acute hypoxic respiratory failure, resolved with treatment of pneumonia. 3. Diabetes mellitus type 2, uncontrolled with hyperglycemia on admission. Hgb A1c 14.0. Secondary to financial reasons, patient could not afford Lantus. Blood sugars now stable. Continue insulin 70/30 on discharge. 4. History of COPD, chronic respiratory failure on oxygen at night. Stable. 5. History of coronary artery disease. Asymptomatic. 6. Noncompliance with insulin secondary to financial reasons.   Finish abx as outpatient  Discharge home on insulin 70/30. He has needles, glucometer, strips.  Continue chronic oxygen at night.   Discussed with wife at bedside  Code Status: full code DVT prophylaxis: Lovenox Family Communication: none present Disposition Plan: home   [name], MD  [facility]  Pager [phone] If 7PM-7AM, please contact night-coverage at [email or web address], password [facility] 05/28/2013, 11:36 AM  LOS: 4 days   Summary: 73 year old man presented with cough, shortness of breath. Admitted for community-acquired pneumonia.  Consultants:  none  Procedures:  2-D echocardiogram: LVEF 86-57%, grade 1 diastolic dysfunction  Antibiotics:  Zithromax 1/7 >> 1/11  Ceftriaxone 1/7 >> 1/9  Ceftin 1/9 >> 1/14  HPI/Subjective: Feels better. Breathing better. No complaints.  Objective: Filed Vitals:   05/27/13 1937 05/27/13 2009 05/28/13 0441 05/28/13 0851  BP:  164/78 136/72 138/74  Pulse:  82 78   Temp:  98.3 F (36.8 C) 98.7 F (37.1 C)   TempSrc:  Oral Oral   Resp:   20   Height:      Weight:      SpO2: 91%  90%     Intake/Output Summary (Last 24 hours) at 05/28/13 1136 Last data filed at 05/28/13 1024  Gross per 24 hour   Intake   1380 ml  Output      0 ml  Net   1380 ml     Filed Weights   05/24/13 1704 05/27/13 0500  Weight: 100.699 kg (222 lb) 102 kg (224 lb 13.9 oz)    Exam:   Afebrile, vital signs stable. No hypoxia.  General: Appears calm and comfortable.  Cardiovascular: Regular rate and rhythm. No murmur, rub or gallop.  Respiratory: Clear to auscultation bilaterally. No wheezes, rales or rhonchi. Normal respiratory effort.  Psychiatric: Grossly normal mood and affect. Speech fluent and appropriate.  Data Reviewed:  Capillary blood sugars stable. Fasting blood sugar 153  Scheduled Meds: . allopurinol  300 mg Oral Daily  . amLODipine  5 mg Oral q morning - 10a  . aspirin EC  325 mg Oral q morning - 10a  . azithromycin  500 mg Oral Daily  . cefUROXime  500 mg Oral BID WC  . Chlorhexidine Gluconate Cloth  6 each Topical Q0600  . docusate sodium  100 mg Oral BID  . enoxaparin (LOVENOX) injection  40 mg Subcutaneous Q24H  . ezetimibe  10 mg Oral Daily  . insulin aspart protamine- aspart  35 Units Subcutaneous BID WC  . levalbuterol  0.63 mg Nebulization Q6H  . levothyroxine  75 mcg Oral QAC breakfast  . lisinopril  40 mg Oral Daily  . metoprolol  100 mg Oral BID  . mupirocin ointment  1 application Nasal BID  . sodium chloride  3 mL Intravenous Q12H  . tiotropium  18 mcg Inhalation Daily   Continuous Infusions:    Principal Problem:   CAP (community  acquired pneumonia) Active Problems:   Diabetes mellitus, insulin dependent (IDDM), uncontrolled   CAD   PSORIASIS   Hypothyroid   COPD (chronic obstructive pulmonary disease)   Hypokalemia   Hyponatremia

## 2013-05-28 NOTE — Discharge Planning (Addendum)
Pt O2 levels remained at 94% or above at rest (RA) While ambulating pt O2 remained at 90% in better on RA

## 2013-05-28 NOTE — Discharge Planning (Signed)
Pt stated he was ready to be [facility] and he had no pain.  Pt's IV was removed and he was also given Scripts and DC papers.  Pt told of follow up appt. And given education on S/sx to notice indicating future BS issues or signs of infection. Pt instructed to call doctor with further problems or concerns.  Pt will be wheeled to car by PCT and family when ready.

## 2013-05-28 NOTE — Plan of Care (Signed)
Pt educated and encouraged to walk, walk, walk - to encouraged bowels.  Pt independent but told if needs help or feels weak or dizzy to let us know.

## 2013-05-28 NOTE — Discharge Summary (Signed)
Physician Discharge Summary  [name] [id number] DOB: May 30, 1940 DOA: 05/24/2013  PCP: Tula Nakayama, MD  Admit date: 05/24/2013 Discharge date: 05/28/2013  Recommendations for Outpatient Follow-up:  1. Resolution of community-acquired pneumonia 2. Diabetes mellitus type 2, uncontrolled with hemoglobin A1c 14.0. Patient cannot afford Lantus, therefore transitioned to insulin 70/30. He has other supplies at home.   Follow-up Information   Follow up with Tula Nakayama, MD. Schedule an appointment as soon as possible for a visit in 1 week.   Specialty:  Family Medicine   Contact information:   [address], [place] [address] [phone]      Discharge Diagnoses:  1. Community acquired pneumonia 2. Acute hypoxic respiratory failure 3. Diabetes mellitus type 2, uncontrolled with hyperglycemia on admission. 4. History of COPD, chronic respiratory failure on oxygen at night 5. Noncompliance with insulin secondary to financial reasons  Discharge Condition: Improved Disposition: Home  Diet recommendation: Diabetic diet  Filed Weights   05/24/13 1704 05/27/13 0500  Weight: 100.699 kg (222 lb) 102 kg (224 lb 13.9 oz)    History of present illness:  73 year old man presented with cough, shortness of breath. Admitted for community-acquired pneumonia.  Hospital Course:  [name] was treated with empiric antibiotics and his condition rapidly improved with resolution of daytime hypoxia. Hyperglycemia improved and is now stable on subcutaneous insulin. Hospitalization was uncomplicated and he is now stable for discharge. See individual issues below.   1. Community acquired pneumonia. Appears clinically resolved, hypoxia has resolved. Influenza negative.  2. Acute hypoxic respiratory failure, resolved with treatment of pneumonia. 3. Diabetes mellitus type 2, uncontrolled with hyperglycemia on admission. Hgb A1c 14.0. Secondary to financial reasons, patient  could not afford Lantus. Blood sugars now stable. Continue insulin 70/30 on discharge. 4. History of COPD, chronic respiratory failure on oxygen at night. Stable. 5. History of coronary artery disease. Asymptomatic. 6. Noncompliance with insulin secondary to financial reasons.  Consultants:  none Procedures:  2-D echocardiogram: LVEF 123456, grade 1 diastolic dysfunction Antibiotics:  Zithromax 1/7 >> 1/11  Ceftriaxone 1/7 >> 1/9  Ceftin 1/9 >> 1/14  Discharge Instructions  Discharge Orders   Future Appointments Provider Department Dept Phone   08/23/2013 10:15 AM Fayrene Helper, MD [facility] [phone]   Future Orders Complete By Expires   Activity as tolerated - No restrictions  As directed    Diet - low sodium heart healthy  As directed    Diet Carb Modified  As directed    Discharge instructions  As directed    Comments:     Call your physician or seek immediate medical attention for shortness of breath, blood sugar greater than 400, less than 70 or worsening of condition. You were started on a new form of insulin which is more affordable. Be sure to stop Lantus.       Medication List    STOP taking these medications       HUMALOG KWIKPEN 100 UNIT/ML injection  Generic drug:  insulin lispro     insulin glargine 100 units/mL Soln  Commonly known as:  LANTUS     naproxen 500 MG tablet  Commonly known as:  NAPROSYN     predniSONE 10 MG tablet  Commonly known as:  STERAPRED UNI-PAK      TAKE these medications       albuterol 108 (90 BASE) MCG/ACT inhaler  Commonly known as:  PROVENTIL HFA;VENTOLIN HFA  Inhale 1 puff into the lungs every 6 (  six) hours as needed for wheezing or shortness of breath.     allopurinol 300 MG tablet  Commonly known as:  ZYLOPRIM  Take 300 mg by mouth daily.     amLODipine 5 MG tablet  Commonly known as:  NORVASC  Take 1 tablet (5 mg total) by mouth every morning.     aspirin EC 325 MG tablet  Take 325 mg by  mouth every morning.     azithromycin 250 MG tablet  Commonly known as:  ZITHROMAX  Take 1 tablet (250 mg total) by mouth daily. First dose evening January 10.     cefUROXime 500 MG tablet  Commonly known as:  CEFTIN  Take 1 tablet (500 mg total) by mouth 2 (two) times daily with a meal.     ezetimibe 10 MG tablet  Commonly known as:  ZETIA  Take 1 tablet (10 mg total) by mouth daily.     HYDROcodone-acetaminophen 5-325 MG per tablet  Commonly known as:  NORCO/VICODIN  Take 1 tablet by mouth every 6 (six) hours as needed.     insulin NPH-regular Human (70-30) 100 UNIT/ML injection  Commonly known as:  NOVOLIN 70/30  Inject 30 Units into the skin 2 (two) times daily with a meal.     levothyroxine 75 MCG tablet  Commonly known as:  SYNTHROID, LEVOTHROID  Take 75 mcg by mouth every morning.     lisinopril 40 MG tablet  Commonly known as:  PRINIVIL,ZESTRIL  Take 1 tablet (40 mg total) by mouth daily.     metFORMIN 500 MG tablet  Commonly known as:  GLUCOPHAGE  Take 1,000 mg by mouth 2 (two) times daily.     metoprolol 100 MG tablet  Commonly known as:  LOPRESSOR  Take 1 tablet (100 mg total) by mouth 2 (two) times daily.     OXYGEN-HELIUM IN  Inhale 2 L into the lungs at bedtime.     terbinafine 250 MG tablet  Commonly known as:  LAMISIL  Take 1 tablet (250 mg total) by mouth daily.     tiotropium 18 MCG inhalation capsule  Commonly known as:  SPIRIVA  Place 1 capsule (18 mcg total) into inhaler and inhale daily.     TRILIPIX 135 MG capsule  Generic drug:  Choline Fenofibrate  Take 135 mg by mouth daily.       No Known Allergies  The results of significant diagnostics from this hospitalization (including imaging, microbiology, ancillary and laboratory) are listed below for reference.    Significant Diagnostic Studies: Dg Chest 2 View  05/24/2013   CLINICAL DATA:  Cough and congestion.  EXAM: CHEST  2 VIEW  COMPARISON:  05/18/2013.  FINDINGS: The cardiac  silhouette, mediastinal and hilar contours are within normal limits and stable. There are bibasilar infiltrates which are new since the prior study no pleural effusion or pulmonary edema. The bony thorax is intact.  IMPRESSION: Bibasilar infiltrates   Electronically Signed   By: Kalman Jewels M.D.   On: 05/24/2013 17:18   Microbiology: Recent Results (from the past 240 hour(s))  MRSA PCR SCREENING     Status: Abnormal   Collection Time    05/25/13  1:10 PM      Result Value Range Status   MRSA by PCR POSITIVE (*) NEGATIVE Final   Comment:            The GeneXpert MRSA Assay (FDA     approved for NASAL specimens     only), is  one component of a     comprehensive MRSA colonization     surveillance program. It is not     intended to diagnose MRSA     infection nor to guide or     monitor treatment for     MRSA infections.     RESULT CALLED TO, READ BACK BY AND VERIFIED WITH:     GRAY,M. AT 1701 BY BAUGHAM,M.     Labs: Basic Metabolic Panel:  Recent Labs Lab 05/24/13 1817 05/25/13 0612 05/25/13 1625 05/26/13 0444 05/27/13 0431  NA 129* 138 135* 132* 136*  K 4.0 3.6* 3.7 4.5 4.1  CL 91* 101 100 98 99  CO2 23 26 25 21 23   GLUCOSE 528* 172* 179* 240* 101*  BUN 25* 19 15 11 10   CREATININE 0.90 0.83 0.79 0.79 0.81  CALCIUM 9.2 8.8 8.4 8.4 8.7  MG  --   --   --   --  1.9   Liver Function Tests:  Recent Labs Lab 05/25/13 0612  AST 8  ALT 13  ALKPHOS 81  BILITOT 0.5  PROT 6.4  ALBUMIN 2.6*   No results found for this basename: LIPASE, AMYLASE,  in the last 168 hours No results found for this basename: AMMONIA,  in the last 168 hours CBC:  Recent Labs Lab 05/24/13 1817 05/25/13 0612  WBC 19.2* 13.5*  NEUTROABS 14.8*  --   HGB 13.7 11.7*  HCT 41.5 35.3*  MCV 78.6 78.6  PLT 311 238   Cardiac Enzymes:  Recent Labs Lab 05/24/13 1817 05/25/13 0140 05/25/13 0612 05/25/13 1305  TROPONINI <0.30 <0.30 <0.30 <0.30   BNP: BNP (last 3 results)  Recent  Labs  05/09/13 2028 05/18/13 1553  PROBNP 132.2* 137.1*   CBG:  Recent Labs Lab 05/27/13 1122 05/27/13 1654 05/27/13 2008 05/28/13 0803 05/28/13 1140  GLUCAP 160* 126* 109* 153* 113*    Principal Problem:   CAP (community acquired pneumonia) Active Problems:   Diabetes mellitus, insulin dependent (IDDM), uncontrolled   CAD   PSORIASIS   Hypothyroid   COPD (chronic obstructive pulmonary disease)   Hypokalemia   Hyponatremia   Time coordinating discharge: 35 minutes  Signed:  [name], MD [facility] 05/28/2013, 12:29 PM

## 2013-06-07 ENCOUNTER — Ambulatory Visit: Payer: Medicare Other | Admitting: Family Medicine

## 2013-06-07 ENCOUNTER — Telehealth: Payer: Self-pay | Admitting: Family Medicine

## 2013-06-07 NOTE — Telephone Encounter (Signed)
Patient could not get here something happened to car so I scheduled him for tomorrow he wanted to know could he come in today later when his wife got home around 3:30 and I asked Loma Sousa she stated to schedule him in the morning

## 2013-06-07 NOTE — Telephone Encounter (Signed)
Noted  

## 2013-06-08 ENCOUNTER — Ambulatory Visit (HOSPITAL_COMMUNITY)
Admission: RE | Admit: 2013-06-08 | Discharge: 2013-06-08 | Disposition: A | Discharge status: Home / Self Care | Payer: Medicare Other | Source: Ambulatory Visit | Attending: Family Medicine | Admitting: Family Medicine

## 2013-06-08 ENCOUNTER — Encounter (INDEPENDENT_AMBULATORY_CARE_PROVIDER_SITE_OTHER): Payer: Self-pay

## 2013-06-08 ENCOUNTER — Encounter: Payer: Self-pay | Admitting: Family Medicine

## 2013-06-08 ENCOUNTER — Ambulatory Visit (INDEPENDENT_AMBULATORY_CARE_PROVIDER_SITE_OTHER): Payer: Medicare Other | Admitting: Family Medicine

## 2013-06-08 VITALS — BP 150/84 | HR 90 | Resp 16 | Ht 71.0 in | Wt 225.4 lb

## 2013-06-08 DIAGNOSIS — IMO0002 Reserved for concepts with insufficient information to code with codable children: Secondary | ICD-10-CM

## 2013-06-08 DIAGNOSIS — I10 Essential (primary) hypertension: Secondary | ICD-10-CM

## 2013-06-08 DIAGNOSIS — E785 Hyperlipidemia, unspecified: Secondary | ICD-10-CM

## 2013-06-08 DIAGNOSIS — E669 Obesity, unspecified: Secondary | ICD-10-CM

## 2013-06-08 DIAGNOSIS — L408 Other psoriasis: Secondary | ICD-10-CM

## 2013-06-08 DIAGNOSIS — J189 Pneumonia, unspecified organism: Secondary | ICD-10-CM

## 2013-06-08 DIAGNOSIS — N3000 Acute cystitis without hematuria: Secondary | ICD-10-CM

## 2013-06-08 DIAGNOSIS — E1165 Type 2 diabetes mellitus with hyperglycemia: Secondary | ICD-10-CM

## 2013-06-08 DIAGNOSIS — IMO0001 Reserved for inherently not codable concepts without codable children: Secondary | ICD-10-CM

## 2013-06-08 DIAGNOSIS — E1065 Type 1 diabetes mellitus with hyperglycemia: Secondary | ICD-10-CM

## 2013-06-08 DIAGNOSIS — N390 Urinary tract infection, site not specified: Secondary | ICD-10-CM

## 2013-06-08 DIAGNOSIS — Z794 Long term (current) use of insulin: Secondary | ICD-10-CM

## 2013-06-08 LAB — POCT URINALYSIS DIPSTICK
Bilirubin, UA: NEGATIVE
Glucose, UA: NEGATIVE
Nitrite, UA: NEGATIVE
Protein, UA: NEGATIVE
RBC UA: NEGATIVE
SPEC GRAV UA: 1.025
Urobilinogen, UA: 0.2
pH, UA: 5.5

## 2013-06-08 MED ORDER — LEVOFLOXACIN 500 MG PO TABS: 500.0000 mg | ORAL_TABLET | Freq: Every day | ORAL | Status: AC

## 2013-06-08 NOTE — Patient Instructions (Signed)
F/u in early May call if you need me before  CXR today  CCUA today in office   Call Dr Danella Penton in [place] to reschedule your appointment for your skin number is [phone]  Call Dr Gershon Crane or go to office on [address] and reschedule your eye exam, you missed the December appointment  Schedule appointment with Dr [name] to follow upon your blood sugar , and new medication  you are taking

## 2013-06-10 LAB — URINE CULTURE
Colony Count: NO GROWTH
ORGANISM ID, BACTERIA: NO GROWTH

## 2013-06-13 ENCOUNTER — Other Ambulatory Visit: Payer: Self-pay | Admitting: Family Medicine

## 2013-07-17 ENCOUNTER — Telehealth: Payer: Self-pay | Admitting: Family Medicine

## 2013-07-17 NOTE — Assessment & Plan Note (Signed)
Improved. Pt applauded on succesful weight loss through lifestyle change, and encouraged to continue same. Weight loss goal set for the next several months.  

## 2013-07-17 NOTE — Assessment & Plan Note (Addendum)
Urinalysis in office today trace leukocyte, will send for c/s

## 2013-07-17 NOTE — Assessment & Plan Note (Signed)
elevated TG and low  And low HDL, pt on no statin, unsure why, ned to contact hinm for nurse encounter will his meds to have this addressed, also of note the BP is elevated so this will need to be addressed  Also at the time, so will likely make it MD visit with CLEAR directions to bring ALL meds to visit

## 2013-07-17 NOTE — Assessment & Plan Note (Signed)
rept imaging still shows infiltrates and pt still has cough, additional course of antibiotic prescribed, will need re imaging early in March

## 2013-07-17 NOTE — Assessment & Plan Note (Signed)
Deteriorated, needs to re establish with endo and hopefully get on an affordable regime so that his blood sugar xan be controlled Patient advised to reduce carb and sweets, commit to regular physical activity, take meds as prescribed, test blood as directed, and attempt to lose weight, to improve blood sugar control.

## 2013-07-17 NOTE — Assessment & Plan Note (Signed)
Uncontrolled and not at goal. DASH diet and commitment to daily physical activity for a minimum of 30 minutes discussed and encouraged, as a part of hypertension management. The importance of attaining a healthy weight is also discussed. May need medciation adjustment will re evaluate at next visit

## 2013-07-17 NOTE — Telephone Encounter (Signed)
Pls contact pt, he needs f/u CXr this week, entered for Wednesday, so on that day or after had pneumonia and rept was not clear in Jan, also pls ask him to bring medication for you to review and enter accurately, he is not on a statin, should be on one , and his BP was elevated. Generally does not bring his meds to visit and he has multiple chronic illnesses not well controlled

## 2013-07-17 NOTE — Progress Notes (Signed)
   Subjective:    Patient ID: [name], male    DOB: 1940-12-24, 73 y.o.   MRN: [id number]  HPI Pt in for f/u recent hospitalization for CAP. States he is generally feeling better, but still has cough and chest congestion, sputum is yellow and he is experiencing shortness of breath with activity . Denies current fever or chills. Blood sugars are still high, he will make appt with endo,  But finances are a limiting factor in his care. Still needs eye exam and to keep appt re severe psoriasis   Review of Systems See HPI  Denies sinus pressure, nasal congestion, ear pain or sore throat.  Denies chest pains, palpitations and leg swelling Denies abdominal pain, nausea, vomiting,diarrhea or constipation.   Denies dysuria, frequency, hesitancy or incontinence. Denies joint pain, swelling and limitation in mobility. Denies headaches, seizures, numbness, or tingling. Denies depression, anxiety or insomnia.        Objective:   Physical Exam BP 150/84  Pulse 90  Resp 16  Ht 5\' 11"  (1.803 m)  Wt 225 lb 6.4 oz (102.241 kg)  BMI 31.45 kg/m2  SpO2 91% Patient alert and oriented and in no cardiopulmonary distress.  HEENT: No facial asymmetry, EOMI, no sinus tenderness,  oropharynx pink and moist.  Neck supple no adenopathy.  Chest: decreased air entry scattered bibasilar crackles, no wheezes  CVS: S1, S2 no murmurs, no S3.  ABD: Soft non tender. Bowel sounds normal.  Ext: No edema  MS: Adequate ROM spine, shoulders, hips and knees.  Skin: Intact, no ulcerations or rash noted.  Psych: Good eye contact, normal affect. Memory intact not anxious or depressed appearing.  CNS: CN 2-12 intact, power, tone and sensation normal throughout.        Assessment & Plan:  CAP (community acquired pneumonia) rept imaging still shows infiltrates and pt still has cough, additional course of antibiotic prescribed, will need re imaging early in March  Diabetes mellitus, insulin  dependent (IDDM), uncontrolled Deteriorated, needs to re establish with endo and hopefully get on an affordable regime so that his blood sugar xan be controlled Patient advised to reduce carb and sweets, commit to regular physical activity, take meds as prescribed, test blood as directed, and attempt to lose weight, to improve blood sugar control.   Recurrent UTI Urinalysis in office today trace leukocyte, will send for c/s  Hyperlipidemia LDL goal < 70 elevated TG and low  And low HDL, pt on no statin, unsure why, ned to contact hinm for nurse encounter will his meds to have this addressed, also of note the BP is elevated so this will need to be addressed  Also at the time, so will likely make it MD visit with CLEAR directions to bring ALL meds to visit  PSORIASIS Severe , needs definitve care and has been referred to dermatology in the past, needs to go  OBESITY Improved. Pt applauded on succesful weight loss through lifestyle change, and encouraged to continue same. Weight loss goal set for the next several months.   HYPERTENSION Uncontrolled and not at goal. DASH diet and commitment to daily physical activity for a minimum of 30 minutes discussed and encouraged, as a part of hypertension management. The importance of attaining a healthy weight is also discussed. May need medciation adjustment will re evaluate at next visit

## 2013-07-17 NOTE — Assessment & Plan Note (Signed)
Severe , needs definitve care and has been referred to dermatology in the past, needs to go

## 2013-07-21 NOTE — Telephone Encounter (Signed)
Patient aware and will go have chest xray and come by office and bring his meds

## 2013-07-25 ENCOUNTER — Other Ambulatory Visit: Payer: Self-pay

## 2013-07-25 ENCOUNTER — Telehealth: Payer: Self-pay

## 2013-07-25 ENCOUNTER — Ambulatory Visit (HOSPITAL_COMMUNITY)
Admission: RE | Admit: 2013-07-25 | Discharge: 2013-07-25 | Disposition: A | Discharge status: Home / Self Care | Payer: Medicare Other | Source: Ambulatory Visit | Attending: Family Medicine | Admitting: Family Medicine

## 2013-07-25 DIAGNOSIS — E785 Hyperlipidemia, unspecified: Secondary | ICD-10-CM | POA: Insufficient documentation

## 2013-07-25 DIAGNOSIS — J984 Other disorders of lung: Secondary | ICD-10-CM | POA: Insufficient documentation

## 2013-07-25 DIAGNOSIS — J4489 Other specified chronic obstructive pulmonary disease: Secondary | ICD-10-CM | POA: Insufficient documentation

## 2013-07-25 DIAGNOSIS — E119 Type 2 diabetes mellitus without complications: Secondary | ICD-10-CM | POA: Insufficient documentation

## 2013-07-25 DIAGNOSIS — I2510 Atherosclerotic heart disease of native coronary artery without angina pectoris: Secondary | ICD-10-CM | POA: Insufficient documentation

## 2013-07-25 DIAGNOSIS — J449 Chronic obstructive pulmonary disease, unspecified: Secondary | ICD-10-CM | POA: Insufficient documentation

## 2013-07-25 DIAGNOSIS — J9819 Other pulmonary collapse: Secondary | ICD-10-CM | POA: Insufficient documentation

## 2013-07-25 DIAGNOSIS — I10 Essential (primary) hypertension: Secondary | ICD-10-CM | POA: Insufficient documentation

## 2013-07-25 DIAGNOSIS — Z09 Encounter for follow-up examination after completed treatment for conditions other than malignant neoplasm: Secondary | ICD-10-CM | POA: Insufficient documentation

## 2013-07-25 DIAGNOSIS — J189 Pneumonia, unspecified organism: Secondary | ICD-10-CM | POA: Insufficient documentation

## 2013-07-25 NOTE — Telephone Encounter (Signed)
He needs to be on a statin, pls let him know and send in atorvastatin 20mg  one daily #30 refill 5, thanks!

## 2013-07-26 MED ORDER — ATORVASTATIN CALCIUM 20 MG PO TABS: 20.0000 mg | ORAL_TABLET | Freq: Every day | ORAL | Status: DC

## 2013-07-26 NOTE — Telephone Encounter (Signed)
Patient aware and med sent to pharmacy.  He will call if copay is too expensive.

## 2013-07-26 NOTE — Addendum Note (Signed)
Addended by: Denman George B on: 07/26/2013 09:03 AM   Modules accepted: Orders

## 2013-08-12 ENCOUNTER — Telehealth: Payer: Self-pay

## 2013-08-12 NOTE — Telephone Encounter (Signed)
noted 

## 2013-08-15 ENCOUNTER — Ambulatory Visit (INDEPENDENT_AMBULATORY_CARE_PROVIDER_SITE_OTHER): Payer: Medicare Other | Admitting: Family Medicine

## 2013-08-15 ENCOUNTER — Encounter (INDEPENDENT_AMBULATORY_CARE_PROVIDER_SITE_OTHER): Payer: Self-pay

## 2013-08-15 VITALS — BP 160/80 | HR 70 | Resp 20 | Ht 71.0 in | Wt 239.0 lb

## 2013-08-15 DIAGNOSIS — E1065 Type 1 diabetes mellitus with hyperglycemia: Secondary | ICD-10-CM

## 2013-08-15 DIAGNOSIS — I10 Essential (primary) hypertension: Secondary | ICD-10-CM

## 2013-08-15 DIAGNOSIS — Z794 Long term (current) use of insulin: Secondary | ICD-10-CM

## 2013-08-15 DIAGNOSIS — IMO0001 Reserved for inherently not codable concepts without codable children: Secondary | ICD-10-CM

## 2013-08-15 DIAGNOSIS — IMO0002 Reserved for concepts with insufficient information to code with codable children: Secondary | ICD-10-CM

## 2013-08-15 DIAGNOSIS — E669 Obesity, unspecified: Secondary | ICD-10-CM

## 2013-08-15 DIAGNOSIS — I2510 Atherosclerotic heart disease of native coronary artery without angina pectoris: Secondary | ICD-10-CM

## 2013-08-15 DIAGNOSIS — E1165 Type 2 diabetes mellitus with hyperglycemia: Secondary | ICD-10-CM

## 2013-08-15 DIAGNOSIS — E785 Hyperlipidemia, unspecified: Secondary | ICD-10-CM

## 2013-08-15 MED ORDER — AMLODIPINE BESYLATE 10 MG PO TABS
10.0000 mg | ORAL_TABLET | Freq: Every day | ORAL | Status: DC
Start: 2013-08-15 — End: 2013-08-26

## 2013-08-15 NOTE — Progress Notes (Signed)
   Subjective:    Patient ID: [name], male    DOB: [birth year], 73 y.o.   MRN: [id number]  HPI The PT is here for follow up and re-evaluation of chronic medical conditions, medication management and review of any available recent lab and radiology data.  Preventive health is updated, specifically  Cancer screening and Immunization.   Questions or concerns regarding consultations or procedures which the PT has had in the interim are  Addressed.Has seen dermatology re skin lesions which are improved The PT denies any adverse reactions to current medications since the last visit.  C/o increased exertional fatigue most noted in past month, denies chest discomfort, nausea or diaphoresis. Does have cAD and needs cardiology re evaluation Denies polyuria , polydipsia or hypoglycemic episodes    Review of Systems See HPI Denies recent fever or chills. Denies sinus pressure, nasal congestion, ear pain or sore throat. Denies chest congestion, productive cough or wheezing.  Denies abdominal pain, nausea, vomiting,diarrhea or constipation.   Denies dysuria, frequency, hesitancy or incontinence. Chronic  joint swelling and limitation in mobility. Denies headaches, seizures, numbness, or tingling. Denies depression, anxiety or insomnia.       Objective:   Physical Exam  BP 160/80  Pulse 70  Resp 20  Ht 5\' 11"  (1.803 m)  Wt 239 lb (108.41 kg)  BMI 33.35 kg/m2  SpO2 95% Patient alert and oriented and in no cardiopulmonary distress.  HEENT: No facial asymmetry, EOMI, no sinus tenderness,  oropharynx pink and moist.  Neck supple no adenopathy.No jVD  Chest: Clear to auscultation bilaterally.  CVS: S1, S2 no murmurs, no S3.  ABD: Soft non tender. Bowel sounds normal.  Ext: No edema  MS: Adequate ROM spine, shoulders, hips and knees.  Skin: Intact, scaly psoriatic  rash noted.  Psych: Good eye contact, normal affect. Memory intact not anxious or depressed appearing.  CNS:  CN 2-12 intact, power, tone and sensation normal throughout.       Assessment & Plan:  HYPERTENSION Uncontrolled, increase dose of amlodipine DASH diet and commitment to daily physical activity for a minimum of 30 minutes discussed and encouraged, as a part of hypertension management. The importance of attaining a healthy weight is also discussed.   CAD Increased exertional fatigue suspicious for new obstructive heart disease, refer to cardiology for re eval, pot to go to ED if symptoms worsen  Diabetes mellitus, insulin dependent (IDDM), uncontrolled Still uncontrolled, has difficulty with medication being followed by endo and he has advised to be diligent with management per endo  Hyperlipidemia LDL goal < 70 Improved, but tG still high and hDL low Hyperlipidemia:Low fat diet discussed and encouraged.  No med change  OBESITY Unchnaged. Patient re-educated about  the importance of commitment to a  minimum of 150 minutes of exercise per week. The importance of healthy food choices with portion control discussed. Encouraged to start a food diary, count calories and to consider  joining a support group. Sample diet sheets offered. Goals set by the patient for the next several months.

## 2013-08-15 NOTE — Patient Instructions (Signed)
Annual wellness in  3.5 month, call if you need me before  You have an increase in amlodipine to 10mg  one daily, please fill today, your blood pressure is too high.   You are referred to Dr Terrence Dupont due to increased tiredness with activity.   Please try and get to your  Eye specialist in the next 3.5 month

## 2013-08-22 ENCOUNTER — Other Ambulatory Visit (HOSPITAL_COMMUNITY): Payer: Self-pay | Admitting: Cardiology

## 2013-08-22 DIAGNOSIS — R079 Chest pain, unspecified: Secondary | ICD-10-CM

## 2013-08-23 ENCOUNTER — Ambulatory Visit: Payer: Medicare Other | Admitting: Family Medicine

## 2013-08-24 ENCOUNTER — Encounter (HOSPITAL_COMMUNITY)
Admission: RE | Admit: 2013-08-24 | Discharge: 2013-08-24 | Disposition: A | Discharge status: Home / Self Care | Payer: Medicare Other | Source: Ambulatory Visit | Attending: Cardiology | Admitting: Cardiology

## 2013-08-24 ENCOUNTER — Other Ambulatory Visit: Payer: Medicare Other

## 2013-08-24 DIAGNOSIS — I2510 Atherosclerotic heart disease of native coronary artery without angina pectoris: Secondary | ICD-10-CM | POA: Insufficient documentation

## 2013-08-24 DIAGNOSIS — K219 Gastro-esophageal reflux disease without esophagitis: Secondary | ICD-10-CM | POA: Insufficient documentation

## 2013-08-24 DIAGNOSIS — R079 Chest pain, unspecified: Secondary | ICD-10-CM | POA: Insufficient documentation

## 2013-08-24 DIAGNOSIS — E785 Hyperlipidemia, unspecified: Secondary | ICD-10-CM | POA: Insufficient documentation

## 2013-08-24 DIAGNOSIS — I259 Chronic ischemic heart disease, unspecified: Secondary | ICD-10-CM | POA: Insufficient documentation

## 2013-08-24 DIAGNOSIS — E119 Type 2 diabetes mellitus without complications: Secondary | ICD-10-CM | POA: Insufficient documentation

## 2013-08-24 LAB — LIPID PANEL
CHOL/HDL RATIO: 5.9 ratio
CHOLESTEROL: 141 mg/dL (ref 0–200)
HDL: 24 mg/dL — ABNORMAL LOW (ref >39)
LDL CALC: 78 mg/dL (ref 0–99)
Triglycerides: 195 mg/dL — ABNORMAL HIGH (ref <150)
VLDL: 39 mg/dL (ref 0–40)

## 2013-08-24 LAB — BASIC METABOLIC PANEL
BUN: 19 mg/dL (ref 6–23)
CHLORIDE: 101 meq/L (ref 96–112)
CO2: 26 mEq/L (ref 19–32)
Calcium: 9.8 mg/dL (ref 8.4–10.5)
Creatinine, Ser: 0.96 mg/dL (ref 0.50–1.35)
GFR calc non Af Amer: 81 mL/min — ABNORMAL LOW (ref >90)
GLUCOSE: 120 mg/dL — AB (ref 70–99)
POTASSIUM: 4 meq/L (ref 3.7–5.3)
SODIUM: 141 meq/L (ref 137–147)

## 2013-08-24 LAB — HEPATIC FUNCTION PANEL
ALBUMIN: 3.5 g/dL (ref 3.5–5.2)
ALT: 14 U/L (ref 0–53)
AST: 15 U/L (ref 0–37)
Alkaline Phosphatase: 94 U/L (ref 39–117)
BILIRUBIN TOTAL: 0.3 mg/dL (ref 0.3–1.2)
Total Protein: 7.6 g/dL (ref 6.0–8.3)

## 2013-08-24 LAB — HEMOGLOBIN A1C
Hgb A1c MFr Bld: 7.8 % — ABNORMAL HIGH (ref <5.7)
Mean Plasma Glucose: 177 mg/dL — ABNORMAL HIGH (ref <117)

## 2013-08-24 MED ORDER — REGADENOSON 0.4 MG/5ML IV SOLN
INTRAVENOUS | Status: AC
  Administered 2013-08-24: 0.4 mg via INTRAVENOUS
  Filled 2013-08-24: qty 5

## 2013-08-24 MED ORDER — TECHNETIUM TC 99M SESTAMIBI GENERIC - CARDIOLITE
30.0000 | Freq: Once | INTRAVENOUS | Status: AC | PRN
  Administered 2013-08-24: 30 via INTRAVENOUS

## 2013-08-24 MED ORDER — REGADENOSON 0.4 MG/5ML IV SOLN
0.4000 mg | Freq: Once | INTRAVENOUS | Status: AC
  Administered 2013-08-24: 0.4 mg via INTRAVENOUS

## 2013-08-24 MED ORDER — TECHNETIUM TC 99M SESTAMIBI GENERIC - CARDIOLITE
10.0000 | Freq: Once | INTRAVENOUS | Status: AC | PRN
  Administered 2013-08-24: 10 via INTRAVENOUS

## 2013-08-26 ENCOUNTER — Encounter (HOSPITAL_COMMUNITY): Payer: Self-pay | Admitting: Pharmacy Technician

## 2013-08-30 ENCOUNTER — Other Ambulatory Visit: Payer: Self-pay

## 2013-08-30 ENCOUNTER — Encounter (HOSPITAL_COMMUNITY)
Admission: RE | Disposition: A | Discharge status: Home / Self Care | Payer: Medicare Other | Source: Ambulatory Visit | Attending: Cardiology

## 2013-08-30 ENCOUNTER — Ambulatory Visit (HOSPITAL_COMMUNITY)
Admission: RE | Admit: 2013-08-30 | Discharge: 2013-08-31 | Disposition: A | Discharge status: Home / Self Care | Payer: Medicare Other | Source: Ambulatory Visit | Attending: Cardiology | Admitting: Cardiology

## 2013-08-30 ENCOUNTER — Encounter (HOSPITAL_COMMUNITY): Payer: Self-pay | Admitting: General Practice

## 2013-08-30 DIAGNOSIS — Z794 Long term (current) use of insulin: Secondary | ICD-10-CM | POA: Insufficient documentation

## 2013-08-30 DIAGNOSIS — I209 Angina pectoris, unspecified: Secondary | ICD-10-CM | POA: Insufficient documentation

## 2013-08-30 DIAGNOSIS — J449 Chronic obstructive pulmonary disease, unspecified: Secondary | ICD-10-CM

## 2013-08-30 DIAGNOSIS — E119 Type 2 diabetes mellitus without complications: Secondary | ICD-10-CM | POA: Insufficient documentation

## 2013-08-30 DIAGNOSIS — Z955 Presence of coronary angioplasty implant and graft: Secondary | ICD-10-CM

## 2013-08-30 DIAGNOSIS — E7800 Pure hypercholesterolemia, unspecified: Secondary | ICD-10-CM | POA: Insufficient documentation

## 2013-08-30 DIAGNOSIS — M109 Gout, unspecified: Secondary | ICD-10-CM | POA: Insufficient documentation

## 2013-08-30 DIAGNOSIS — I252 Old myocardial infarction: Secondary | ICD-10-CM | POA: Insufficient documentation

## 2013-08-30 DIAGNOSIS — E039 Hypothyroidism, unspecified: Secondary | ICD-10-CM | POA: Insufficient documentation

## 2013-08-30 DIAGNOSIS — I2510 Atherosclerotic heart disease of native coronary artery without angina pectoris: Principal | ICD-10-CM | POA: Insufficient documentation

## 2013-08-30 DIAGNOSIS — Z87891 Personal history of nicotine dependence: Secondary | ICD-10-CM | POA: Insufficient documentation

## 2013-08-30 DIAGNOSIS — I10 Essential (primary) hypertension: Secondary | ICD-10-CM | POA: Insufficient documentation

## 2013-08-30 DIAGNOSIS — E1121 Type 2 diabetes mellitus with diabetic nephropathy: Secondary | ICD-10-CM

## 2013-08-30 HISTORY — PX: LEFT HEART CATHETERIZATION WITH CORONARY ANGIOGRAM: SHX5451

## 2013-08-30 HISTORY — PX: PERCUTANEOUS CORONARY STENT INTERVENTION (PCI-S): SHX5485

## 2013-08-30 HISTORY — DX: Unspecified osteoarthritis, unspecified site: M19.90

## 2013-08-30 HISTORY — DX: Pneumonia, unspecified organism: J18.9

## 2013-08-30 HISTORY — DX: Acute myocardial infarction, unspecified: I21.9

## 2013-08-30 LAB — POCT ACTIVATED CLOTTING TIME: ACTIVATED CLOTTING TIME: 359 s

## 2013-08-30 LAB — CBC
HCT: 37.3 % — ABNORMAL LOW (ref 39.0–52.0)
Hemoglobin: 12 g/dL — ABNORMAL LOW (ref 13.0–17.0)
MCH: 26 pg (ref 26.0–34.0)
MCHC: 32.2 g/dL (ref 30.0–36.0)
MCV: 80.9 fL (ref 78.0–100.0)
PLATELETS: 304 10*3/uL (ref 150–400)
RBC: 4.61 MIL/uL (ref 4.22–5.81)
RDW: 14.9 % (ref 11.5–15.5)
WBC: 12.6 10*3/uL — AB (ref 4.0–10.5)

## 2013-08-30 LAB — GLUCOSE, CAPILLARY
GLUCOSE-CAPILLARY: 107 mg/dL — AB (ref 70–99)
Glucose-Capillary: 93 mg/dL (ref 70–99)
Glucose-Capillary: 94 mg/dL (ref 70–99)

## 2013-08-30 LAB — MRSA PCR SCREENING: MRSA by PCR: NEGATIVE

## 2013-08-30 SURGERY — LEFT HEART CATHETERIZATION WITH CORONARY ANGIOGRAM
Anesthesia: LOCAL | Site: Groin | Laterality: Right

## 2013-08-30 MED ORDER — LISINOPRIL 40 MG PO TABS
40.0000 mg | ORAL_TABLET | Freq: Every day | ORAL | Status: DC
  Administered 2013-08-30 – 2013-08-31 (×2): 40 mg via ORAL
  Filled 2013-08-30 (×2): qty 1

## 2013-08-30 MED ORDER — LIDOCAINE HCL (PF) 1 % IJ SOLN
INTRAMUSCULAR | Status: AC
  Filled 2013-08-30: qty 30

## 2013-08-30 MED ORDER — ATORVASTATIN CALCIUM 20 MG PO TABS
20.0000 mg | ORAL_TABLET | Freq: Every day | ORAL | Status: DC
  Administered 2013-08-30 – 2013-08-31 (×2): 20 mg via ORAL
  Filled 2013-08-30 (×2): qty 1

## 2013-08-30 MED ORDER — FENTANYL CITRATE 0.05 MG/ML IJ SOLN
INTRAMUSCULAR | Status: AC
  Filled 2013-08-30: qty 2

## 2013-08-30 MED ORDER — TICAGRELOR 90 MG PO TABS
ORAL_TABLET | ORAL | Status: AC
  Filled 2013-08-30: qty 2

## 2013-08-30 MED ORDER — ALBUTEROL SULFATE (2.5 MG/3ML) 0.083% IN NEBU: 2.5000 mg | INHALATION_SOLUTION | Freq: Four times a day (QID) | RESPIRATORY_TRACT | Status: DC | PRN

## 2013-08-30 MED ORDER — BIVALIRUDIN 250 MG IV SOLR
INTRAVENOUS | Status: AC
  Filled 2013-08-30: qty 250

## 2013-08-30 MED ORDER — LEVOTHYROXINE SODIUM 75 MCG PO TABS
75.0000 ug | ORAL_TABLET | Freq: Every day | ORAL | Status: DC
  Administered 2013-08-31: 08:00:00 75 ug via ORAL
  Filled 2013-08-30 (×2): qty 1

## 2013-08-30 MED ORDER — SODIUM CHLORIDE 0.9 % IV SOLN
INTRAVENOUS | Status: AC
  Administered 2013-08-30 (×2): via INTRAVENOUS

## 2013-08-30 MED ORDER — SODIUM CHLORIDE 0.9 % IV SOLN
INTRAVENOUS | Status: DC
  Administered 2013-08-30: 08:00:00 via INTRAVENOUS

## 2013-08-30 MED ORDER — ALLOPURINOL 300 MG PO TABS
300.0000 mg | ORAL_TABLET | Freq: Every day | ORAL | Status: DC
  Administered 2013-08-30 – 2013-08-31 (×2): 300 mg via ORAL
  Filled 2013-08-30 (×2): qty 1

## 2013-08-30 MED ORDER — TIOTROPIUM BROMIDE MONOHYDRATE 18 MCG IN CAPS
18.0000 ug | ORAL_CAPSULE | Freq: Every day | RESPIRATORY_TRACT | Status: DC
  Administered 2013-08-31: 08:00:00 18 ug via RESPIRATORY_TRACT
  Filled 2013-08-30: qty 5

## 2013-08-30 MED ORDER — SODIUM CHLORIDE 0.9 % IJ SOLN: 3.0000 mL | INTRAMUSCULAR | Status: DC | PRN

## 2013-08-30 MED ORDER — METOPROLOL TARTRATE 50 MG PO TABS
50.0000 mg | ORAL_TABLET | Freq: Two times a day (BID) | ORAL | Status: DC
  Administered 2013-08-30 – 2013-08-31 (×3): 50 mg via ORAL
  Filled 2013-08-30 (×4): qty 1

## 2013-08-30 MED ORDER — MIDAZOLAM HCL 2 MG/2ML IJ SOLN
INTRAMUSCULAR | Status: AC
  Filled 2013-08-30: qty 2

## 2013-08-30 MED ORDER — INSULIN ASPART PROT & ASPART (70-30 MIX) 100 UNIT/ML ~~LOC~~ SUSP
30.0000 [IU] | Freq: Two times a day (BID) | SUBCUTANEOUS | Status: DC
  Administered 2013-08-30 – 2013-08-31 (×2): 30 [IU] via SUBCUTANEOUS
  Filled 2013-08-30: qty 10

## 2013-08-30 MED ORDER — OXYCODONE-ACETAMINOPHEN 5-325 MG PO TABS: 1.0000 | ORAL_TABLET | ORAL | Status: DC | PRN

## 2013-08-30 MED ORDER — ALBUTEROL SULFATE HFA 108 (90 BASE) MCG/ACT IN AERS: 1.0000 | INHALATION_SPRAY | Freq: Four times a day (QID) | RESPIRATORY_TRACT | Status: DC | PRN

## 2013-08-30 MED ORDER — SODIUM CHLORIDE 0.9 % IJ SOLN: 3.0000 mL | Freq: Two times a day (BID) | INTRAMUSCULAR | Status: DC

## 2013-08-30 MED ORDER — ASPIRIN 81 MG PO CHEW
81.0000 mg | CHEWABLE_TABLET | ORAL | Status: AC
  Administered 2013-08-30: 81 mg via ORAL
  Filled 2013-08-30: qty 1

## 2013-08-30 MED ORDER — HEPARIN (PORCINE) IN NACL 2-0.9 UNIT/ML-% IJ SOLN
INTRAMUSCULAR | Status: AC
  Filled 2013-08-30: qty 1500

## 2013-08-30 MED ORDER — DIAZEPAM 5 MG PO TABS
5.0000 mg | ORAL_TABLET | ORAL | Status: AC
  Administered 2013-08-30: 5 mg via ORAL
  Filled 2013-08-30: qty 1

## 2013-08-30 MED ORDER — ASPIRIN EC 81 MG PO TBEC
81.0000 mg | DELAYED_RELEASE_TABLET | Freq: Every day | ORAL | Status: DC
  Administered 2013-08-31: 81 mg via ORAL
  Filled 2013-08-30 (×2): qty 1

## 2013-08-30 MED ORDER — SODIUM CHLORIDE 0.9 % IV SOLN: 250.0000 mL | INTRAVENOUS | Status: DC | PRN

## 2013-08-30 MED ORDER — TICAGRELOR 90 MG PO TABS
90.0000 mg | ORAL_TABLET | Freq: Two times a day (BID) | ORAL | Status: DC
  Administered 2013-08-30 – 2013-08-31 (×2): 90 mg via ORAL
  Filled 2013-08-30 (×4): qty 1

## 2013-08-30 MED ORDER — NITROGLYCERIN 0.2 MG/ML ON CALL CATH LAB
INTRAVENOUS | Status: AC
  Filled 2013-08-30: qty 1

## 2013-08-30 MED ORDER — TIOTROPIUM BROMIDE MONOHYDRATE 18 MCG IN CAPS
18.0000 ug | ORAL_CAPSULE | Freq: Every day | RESPIRATORY_TRACT | Status: DC
  Filled 2013-08-30: qty 5

## 2013-08-30 MED ORDER — SODIUM CHLORIDE 0.9 % IV SOLN
1.7500 mg/kg/h | INTRAVENOUS | Status: DC
  Filled 2013-08-30: qty 250

## 2013-08-30 MED ORDER — TERBINAFINE HCL 250 MG PO TABS
250.0000 mg | ORAL_TABLET | Freq: Every day | ORAL | Status: DC
  Administered 2013-08-30 – 2013-08-31 (×2): 250 mg via ORAL
  Filled 2013-08-30 (×2): qty 1

## 2013-08-30 MED ORDER — ASPIRIN 81 MG PO CHEW: 81.0000 mg | CHEWABLE_TABLET | Freq: Every day | ORAL | Status: DC

## 2013-08-30 MED ORDER — INSULIN ASPART 100 UNIT/ML ~~LOC~~ SOLN: 0.0000 [IU] | Freq: Three times a day (TID) | SUBCUTANEOUS | Status: DC

## 2013-08-30 MED ORDER — NITROGLYCERIN IN D5W 200-5 MCG/ML-% IV SOLN
5.0000 ug/min | INTRAVENOUS | Status: DC
Start: 2013-08-30 — End: 2013-08-31

## 2013-08-30 NOTE — Progress Notes (Signed)
Site area: right groin  Site Prior to Removal:  Level 0  Pressure Applied For 20 MINUTES    Minutes Beginning at 1440  Manual:   yes  Patient Status During Pull:  stable  Post Pull Groin Site:  Level 0  Post Pull Instructions Given:  yes  Post Pull Pulses Present:  yes  Dressing Applied:  yes  Comments:  Gauze pressure dressing applied

## 2013-08-30 NOTE — Care Management Note (Addendum)
  Page 1 of 1   08/31/2013     1:18:50 PM   CARE MANAGEMENT NOTE 08/31/2013  Patient:  MIN, COLLYMORE   Account Number:  [id number]  Date Initiated:  08/30/2013  Documentation initiated by:  Mariann Laster  Subjective/Objective Assessment:   LEFT HEART CATHETERIZATION WITH CORONARY ANGIOGRAM (N/A)     Action/Plan:   CM to follow for disposition needs   Anticipated DC Date:  08/30/2013   Anticipated DC Plan:  [place]  CM consult  Medication Assistance      Choice offered to / List presented to:             Status of service:  Completed, signed off Medicare Important Message given?   (If response is "NO", the following Medicare IM given date fields will be blank) Date Medicare IM given:   Date Additional Medicare IM given:    Discharge Disposition:  HOME/SELF CARE  Per UR Regulation:    If discussed at Long Length of Stay Meetings, dates discussed:    Comments:  [place] (N/A) this admission Dispositon:  Home / [place]. Crysal Kumiko Fishman RN, BSN, MSHL, CCM 08/31/2013  Benefits Check: (BRILINTA) tablet 90 mg 2x/day Please check for coverage, co-pays, deductibles, authorizations and pharmacy. Thanks, Per report: ---08/30/2013 1432 by NIA Azzie Glatter--- PT COPAY WILL BE $45-NO Cornelius RN, BSN, [place], CCM 08/30/2013

## 2013-08-30 NOTE — Interval H&P Note (Signed)
Cath Lab Visit (complete for each Cath Lab visit)  Clinical Evaluation Leading to the Procedure:   ACS: no  Non-ACS:    Anginal Classification: CCS III  Anti-ischemic medical therapy: Maximal Therapy (2 or more classes of medications)  Non-Invasive Test Results: Intermediate-risk stress test findings: cardiac mortality 1-3%/year  Prior CABG: No previous CABG      History and Physical Interval Note:  08/30/2013 8:54 AM  [name]  has presented today for surgery, with the diagnosis of CP  The various methods of treatment have been discussed with the patient and family. After consideration of risks, benefits and other options for treatment, the patient has consented to  Procedure(s): LEFT HEART CATHETERIZATION WITH CORONARY ANGIOGRAM (N/A) as a surgical intervention .  The patient's history has been reviewed, patient examined, no change in status, stable for surgery.  I have reviewed the patient's chart and labs.  Questions were answered to the patient's satisfaction.     Allegra Lai Timarie Labell

## 2013-08-30 NOTE — H&P (Signed)
  Handwritten H&P needs to be scanned

## 2013-08-31 LAB — BASIC METABOLIC PANEL
BUN: 11 mg/dL (ref 6–23)
CO2: 22 meq/L (ref 19–32)
CREATININE: 0.84 mg/dL (ref 0.50–1.35)
Calcium: 9.4 mg/dL (ref 8.4–10.5)
Chloride: 101 mEq/L (ref 96–112)
GFR calc Af Amer: >90 mL/min (ref >90)
GFR calc non Af Amer: 86 mL/min — ABNORMAL LOW (ref >90)
GLUCOSE: 79 mg/dL (ref 70–99)
Potassium: 3.9 mEq/L (ref 3.7–5.3)
SODIUM: 137 meq/L (ref 137–147)

## 2013-08-31 LAB — CBC
HCT: 38.1 % — ABNORMAL LOW (ref 39.0–52.0)
Hemoglobin: 12.4 g/dL — ABNORMAL LOW (ref 13.0–17.0)
MCH: 26.2 pg (ref 26.0–34.0)
MCHC: 32.5 g/dL (ref 30.0–36.0)
MCV: 80.4 fL (ref 78.0–100.0)
Platelets: 291 10*3/uL (ref 150–400)
RBC: 4.74 MIL/uL (ref 4.22–5.81)
RDW: 14.8 % (ref 11.5–15.5)
WBC: 14.3 10*3/uL — AB (ref 4.0–10.5)

## 2013-08-31 LAB — GLUCOSE, CAPILLARY: GLUCOSE-CAPILLARY: 106 mg/dL — AB (ref 70–99)

## 2013-08-31 MED ORDER — METOPROLOL TARTRATE 100 MG PO TABS: 50.0000 mg | ORAL_TABLET | Freq: Two times a day (BID) | ORAL | Status: DC

## 2013-08-31 MED ORDER — TICAGRELOR 90 MG PO TABS: 90.0000 mg | ORAL_TABLET | Freq: Two times a day (BID) | ORAL | Status: DC

## 2013-08-31 MED ORDER — METFORMIN HCL 500 MG PO TABS: 500.0000 mg | ORAL_TABLET | Freq: Two times a day (BID) | ORAL | Status: DC

## 2013-08-31 MED FILL — Sodium Chloride IV Soln 0.9%: INTRAVENOUS | Qty: 50 | Status: AC

## 2013-08-31 NOTE — Discharge Instructions (Signed)
Angina Pectoris Angina pectoris, often just called angina, is extreme discomfort in your chest, neck, or arm caused by a lack of blood in the middle and thickest layer of your heart wall (myocardium). It may feel like tightness or heavy pressure. It may feel like a crushing or squeezing pain. Some people say it feels like gas or indigestion. It may go down your shoulders, back, and arms. Some people may have symptoms other than pain. These symptoms include fatigue, shortness of breath, cold sweats, or nausea. There are four different types of angina:  Stable angina Stable angina usually occurs in episodes of predictable frequency and duration. It usually is brought on by physical activity, emotional stress, or excitement. These are all times when the myocardium needs more oxygen. Stable angina usually lasts a few minutes and often is relieved by taking a medicine that can be taken under your tongue (sublingually). The medicine is called nitroglycerin. Stable angina is caused by a buildup of plaque inside the arteries, which restricts blood flow to the heart muscle (atherosclerosis).  Unstable angina Unstable angina can occur even when your body experiences little or no physical exertion. It can occur during sleep. It can also occur at rest. It can suddenly increase in severity or frequency. It might not be relieved by sublingual nitroglycerin. It can last up to 30 minutes. The most common cause of unstable angina is a blood clot that has developed on the top of plaque buildup inside a coronary artery. It can lead to a heart attack if the blood clot completely blocks the artery.  Microvascular angina This type of angina is caused by a disorder of tiny blood vessels called arterioles. Microvascular angina is more common in women. The pain may be more severe and last longer than other types of angina pectoris.  Prinzmetal or variant angina This type of angina pectoris usually occurs when your body experiences  little or no physical exertion. It especially occurs in the early morning hours. It is caused by a spasm of your coronary artery. HOME CARE INSTRUCTIONS   Only take over-the-counter and prescription medicines as directed by your caregiver.  Stay active or increase your exercise as directed by your caregiver.  Limit strenuous activity as directed by your caregiver.  Limit heavy lifting as directed by your caregiver.  Maintain a healthy weight.  Learn about and eat heart-healthy foods.  Do not smoke. SEEK IMMEDIATE MEDICAL CARE IF:  You experience the following symptoms:  Chest, neck, deep shoulder, or arm pain or discomfort that lasts more than a few minutes.  Chest, neck, deep shoulder, or arm pain or discomfort that goes away and comes back, repeatedly.  Heavy sweating with discomfort, without a noticeable cause.  Shortness of breath or difficulty breathing.  Angina that does not get better after a few minutes of rest or after taking sublingual nitroglycerin. These can all be symptoms of a heart attack, which is a medical emergency! Get medical help at once. Call your local emergency service (911 in [place] immediately. Do not  drive yourself to the hospital and do not  wait to for your symptoms to go away. MAKE SURE YOU:  Understand these instructions.  Will watch your condition.  Will get help right away if you are not doing well or get worse. Document Released: 05/05/2005 Document Revised: 04/21/2012 Document Reviewed: 02/12/2012 [facility] Patient Information 2014 [place], [place].  Coronary Angiography with Stent Coronary angiography with stent placement is a procedure to widen or open a narrow  blood vessel of the heart (coronary artery). When a coronary artery becomes partially blocked, it decreases blood flow to that area. This may lead to chest pain or a heart attack (myocardial infarction). Arteries may become blocked by cholesterol buildup (plaque) in the lining or wall.   A stent is a small piece of metal that looks like a mesh or a spring. Stent placement may be done right after a coronary angiography in which a blocked artery is found or as a treatment for a heart attack.  LET [facility] CARE PROVIDER KNOW ABOUT:  Any allergies you have.   All medicines you are taking, including vitamins, herbs, eye drops, creams, and over-the-counter medicines.   Previous problems you or members of your family have had with the use of anesthetics.   Any blood disorders you have.   Previous surgeries you have had.   Medical conditions you have. RISKS AND COMPLICATIONS Generally, coronary angiography with stent is a safe procedure. However, as with any procedure, complications can occur. Possible complications include:   Damage to the heart or its blood vessels.   A return of blockage.   Bleeding at the site.   Blood clot in another part of the body.   Kidney injury.   Allergic reaction to the dye or contrast used.  BEFORE THE PROCEDURE  Do not eat or drink anything for 6 hours before the procedure.   Ask your health care provider if medicines can be taken with a sip of water.   Your health care provider will make sure you understand the procedure and the risks and potential complications associated with the procedure.  PROCEDURE  You may be given a medicine to help you relax before and during the procedure (sedative). This medicine will be given through an IV tube that is put into one of your veins.   The area where the catheter will be inserted is shaved and cleaned. This is usually done in the groin but may be done in the fold of your arm (near your elbow) or in the wrist.   A medicine will be given to numb the area where the catheter will be inserted (local anesthetic).   The catheter is inserted into an artery using a guide wire. A type of X-ray (fluoroscopy) is used to help guide the catheter to the opening of the blocked artery.    A dye is then injected into the catheter, and X-rays are taken. The dye helps to show where any narrowing or blockages are located in the heart arteries.   A tiny wire is guided to the blocked spot, and a balloon is inflated to make the artery wider. The stent is expanded and crushes the plaque into the wall of the vessel. The stent holds the area open like a scaffolding and improves the blood flow.   Sometimes the artery may be made wider using a laser or other tools to remove plaque.   When the blood flow is better, the catheter is removed. The lining of the artery will grow over the stent, which stays where it was placed.  AFTER THE PROCEDURE  If the procedure is done through the leg, you will be kept in bed lying flat for about 6 hours. You will be instructed to not bend or cross your legs.   The insertion site will be checked frequently.   The pulse in your feet or wrist will be checked frequently.   Additional blood tests, X-rays, and electrocardiography may be  done. Document Released: 11/09/2002 Document Revised: 02/23/2013 Document Reviewed: 11/11/2012 [facility] Patient Information 2014 [place], [place].

## 2013-08-31 NOTE — Cardiovascular Report (Signed)
NAMEFAHEEM, ZIEMANN              ACCOUNT NO.:  [id number]  MEDICAL RECORD NO.:  [id number]  LOCATION:  6C05C                        FACILITY:  [facility]  PHYSICIAN:  Tanish Prien N. Terrence Dupont, M.D. DATE OF BIRTH:  [birth year]  DATE OF PROCEDURE:  08/30/2013 DATE OF DISCHARGE:                           CARDIAC CATHETERIZATION   PROCEDURE: 1. Left cardiac catheterization with selective left and right coronary     angiography, left ventriculography via right groin using Judkins     technique. 2. Successful percutaneous transluminal coronary angioplasty to mid     and distal junction of right coronary artery using 3.0 x 12 mm long     Emerge balloon and then 3.75 x 20 mm long [place] Emerge balloon. 3. Successful deployment of 3.5 x 33 mm long Xience Alpine drug-     eluting stent using GuideLiner assistance in mid and distal     junction of right coronary artery. 4. Successful postdilatation of this stent using 3.75 x 20 mm long [place]     Emerge balloon followed by a 4.0 x 8 mm long [place] Emerge balloon.  INDICATION FOR THE PROCEDURE:  Mr. Speyer is a 73 year old male with past medical history significant for coronary artery disease, history of inferior wall MI in the past status post PCI to RCA noted to have multivessel coronary artery disease requiring PCI to LAD in the past, hypertension, insulin-requiring diabetes mellitus, hypercholesteremia, morbid obesity, complains of exertional dyspnea with minimal exertion associated with feeling tired, fatigued and no energy.  The patient denies any chest pain, nausea, vomiting, diaphoresis.  Denies palpitation, lightheadedness, or syncope.  Denies rest or nocturnal angina.  Denies PND, orthopnea, or leg swelling.  The patient underwent Lexiscan Myoview on April 8, which showed small area of apical ischemia with EF of 40-58%.  Due to multiple risk factors, new onset exertional dyspnea probably angina equivalent and positive nuclear stress test discussed with  the patient regarding left cath, possible PTCA and stenting, its risks and benefits, i.e., death, MI, stroke, need for emergency CABG, local vascular complications, etc. and consented for PCI.  DESCRIPTION OF PROCEDURE:  After obtaining the informed consent, the patient was brought to the cath lab and was placed on fluoroscopy table. Right groin was prepped and draped in usual fashion.  Xylocaine 1% was used for local anesthesia in the right groin.  With the help of thin wall needle, 5-French arterial sheath was placed.  The sheath was aspirated and flushed.  Next, 5-French left Judkins catheter was advanced over the wire under fluoroscopic guidance up to the ascending aorta.  Wire was pulled out.  The catheter was aspirated and connected to the Manifold.  Catheter was further advanced and engaged into left coronary ostium.  Multiple views of the left system were taken.  Next, catheter was disengaged and was pulled out over the wire and was replaced with 5-French right Judkins catheter, which was advanced over the wire under fluoroscopic guidance up to the ascending aorta.  The wire was pulled out.  The catheter was aspirated and connected to the Manifold.  Catheter was further advanced and engaged into right coronary ostium.  Multiple views of the right system were taken.  Next, catheter was disengaged and was pulled out over the wire and was replaced with 5- French pigtail catheter which was advanced over the wire under fluoroscopic guidance up to the ascending aorta.  Catheter was further advanced across the aortic valve into the LV.  LV pressures were recorded.  Next, LV graphy was done in 30-degree RAO position.  Post- angiographic pressures were recorded from LV and then pullback pressures were recorded from aorta.  There was no gradient across the aortic valve.  Next, pigtail catheter was pulled out over the wire.  Sheaths were aspirated and flushed.  FINDINGS:  LV showed LVH  with good LV systolic function, EF of 94-85%. Left main was patent.  LAD has 30-40% proximal sequential in-stent restenosis.  Diagonal 1 is very small which has 85-90% ostial stenosis. Vessel is not suitable for PCI.  The diagonal 2 and 3 were very small which were patent.  Left circumflex has 50-60% proximal and then 60-70% mid sequential stenosis with aneurysmal dilatation.  OM 1 and OM 2 were very small.  OM 3 has 60-65% ostial stenosis.  OM 4 is very small.  RCA has 30-40% proximal in-stent restenosis and 80-85% sequential mid and distal junction stenosis with aneurysmal dilatation.  Distal RCA stent is widely patent.  PDA and PLV branches were patent.  INTERVENTIONAL PROCEDURE: 1. Successful PTCA to mid and distal junction of RCA was done using     3.0 x 12 mm long, Emerge balloon going up to 8-10 atmospheric     pressure.  Multiple inflations were done, and then attempted to     deploy a 3.5 x 33 mm long Xience Alpine drug-eluting stent without     success as the stent would not track down the run-through wire. 2. PTCA to mid and distal junction of RCA was done using 3.75 x 20 mm     long [name] Emerge balloon and then re-attempted to deploy the stent in     mid and distal junction of RCA without success. 3. Then, GuideLiner was advanced over a 3.0 x 12 mm long balloon under     fluoroscopic guidance up to the mid RCA without difficulty, and     then 3.5 x 33 mm long Xience Alpine drug-eluting stent was deployed     in mid and distal junction of RCA at 10 atmospheric pressure.  The     stent was post dilated using 3.75 x 20 mm long [place] Emerge balloon     going up to 18 atmospheric pressure, followed by a 4.0 x 8 mm long     [place] Emerge balloon which was used to expand the stent in the     aneurysmal segment going up to 15-18 atmospheric pressure.  Lesions     dilated from 80-85% to 0% residual with excellent TIMI grade 3     distal flow without evidence of dissection or distal  embolization.     The patient received weight based Angiomax and 180 mg of Brilinta     prior to the procedure.  The patient tolerated the procedure well.     There were no complications.  The patient was transferred to     recovery room in stable condition.  We discussed with the patient     regarding further evaluation of left circumflex lesion by doing a     nuclear stress test in few months as outpatient.  The patient     tolerated the procedure well.  The  patient had significant back     pain and has history of chronic degenerative joint disease, so FFR     was not attempted as the patient was anxious and wanted to get out     of the cath lab table.     Allegra Lai. Terrence Dupont, M.D.     MNH/MEDQ  D:  08/30/2013  T:  08/31/2013  Job:  [id number]

## 2013-08-31 NOTE — Discharge Summary (Signed)
Discharge summary dictated on 08/31/2013 dictation number is [id number]

## 2013-08-31 NOTE — Discharge Summary (Signed)
NAMEBRECK, MARYLAND              ACCOUNT NO.:  [id number]  MEDICAL RECORD NO.:  [id number]  LOCATION:  6C05C                        FACILITY:  [facility]  PHYSICIAN:  Jaiveon Suppes N. Terrence Dupont, M.D. DATE OF BIRTH:  [birth year]  DATE OF ADMISSION:  08/30/2013 DATE OF DISCHARGE:  08/31/2013                              DISCHARGE SUMMARY   ADMITTING DIAGNOSES: 1. Exertional dyspnea probably angina equivalent.  Positive Lexiscan     Myoview rule out progression of coronary artery disease. 2. Coronary artery disease, history of inferior wall myocardial     infarction, status post multivessel percutaneous coronary     intervention in the past. 3. Hypertension. 4. Insulin-requiring diabetes mellitus. 5. Hypercholesteremia. 6. Hypothyroidism. 7. Morbid obesity. 8. History of gouty arthritis. 9. History of tobacco abuse.  DISCHARGE DIAGNOSES: 1. Stable angina status post left catheterization/percutaneous     transluminal coronary angioplasty and stenting to mid and distal     junction of right coronary artery with excellent results. 2. Coronary artery disease, history of inferior wall myocardial     infarction, status post multivessel percutaneous coronary     intervention in the past. 3. Hypertension. 4. Insulin-requiring diabetes mellitus. 5. Hypercholesteremia. 6. Hypothyroidism. 7. Morbid obesity. 8. History of gouty arthritis. 9. History of tobacco abuse.  DISCHARGE HOME MEDICATIONS: 1. Brilinta 90 mg 1 tablet twice daily. 2. Aspirin 81 mg 1 tablet daily. 3. Metoprolol tartrate 50 mg 1 tablet twice daily. 4. Albuterol inhaler 1 puff every 6 hours as needed as before. 5. Allopurinol 300 mg 1 tablet daily. 6. Amlodipine 10 mg 1 tablet daily. 7. Atorvastatin 20 mg 1 tablet daily. 8. Vitamin D2 50,000 units every week. 9. Novolin 70/30 insulin 45 units twice daily as before. 10.Invokana 100 mg 1 tablet daily. 11.Levothyroxine 75 mcg daily. 12.Metformin 1000 mg twice daily starting from  September 02, 2013. 13.Oxygen helium inhaler 2 inhalations at bedtime as before. 14.Levasil 250 mg by mouth daily. 15.Spiriva 18 mcg, 1 inhaler. 16.Kenalog 0.1% apply locally. 17.The patient has been advised to stop Naprosyn.  DIET:  Low salt, low cholesterol 1800 calories ADA diet.  Post cardiac cath/PTCA instructions have been given.  The patient will be scheduled for phase 2 cardiac rehab in [place].  The patient has been discussed at length regarding lifestyle modification, diet, and exercise.  CONDITION AT DISCHARGE:  Stable.  BRIEF HISTORY AND HOSPITAL COURSE:  Mr. Foot is a 73 year old male with past medical history significant for coronary artery disease, history of inferior wall MI in the past status post PCI to RCA noted to have multivessel coronary artery disease requiring PCI to LAD in the past, hypertension, insulin-requiring diabetes mellitus, hypercholesteremia, morbid obesity, gouty arthritis, hypothyroidism complains of exertional dyspnea with minimal exertion associated with feeling weak, tired, and fatigued and no energy.  The patient denies any chest pain, nausea, vomiting, diaphoresis.  Denies palpitation, lightheadedness, or syncope.  The patient denies any rest or nocturnal angina.  Denies PND, orthopnea or leg swelling.  The patient underwent Lexiscan Myoview on August 24, 2013, which showed small area of apical ischemia with EF of 58%.  PAST MEDICAL HISTORY:  As above.  ALLERGIES:  None.  SOCIAL HISTORY:  He is  retired, worked for [employer] in the past. Smoked 1 pack per day for 35+ years in the past.  No history of alcohol abuse.  FAMILY HISTORY:  Noncontributory.  MEDICATIONS AT HOME: 1. He was on aspirin 81 mg daily. 2. Metoprolol 100 mg twice daily. 3. Amlodipine 10 mg daily. 4. Lisinopril 40 mg daily. 5. Atorvastatin 20 mg daily. 6. Levothyroxine 75 mcg daily. 7. Invokana 100 mg daily. 8. Novolin insulin. 9. Allopurinol 300 mg  daily. 10.Aldactone 25 mg daily. 11.Nitrostat sublingual p.r.n.  PHYSICAL EXAMINATION:  GENERAL:  He was alert, awake, oriented x3. VITAL SIGNS:  Blood pressure was 154/78, pulse was 70 and regular. NECK:  Supple.  No JVD.  No bruit. LUNGS:  Clear to auscultation without rhonchi or rales. CARDIOVASCULAR:  S1, S2 was normal.  There was soft systolic murmur and S4 gallop. ABDOMEN:  Soft.  Bowel sounds present.  Nontender. EXTREMITIES:  There was no clubbing, cyanosis, or edema.  LABORATORY DATA:  Sodium was 141, potassium 4.0, glucose 177, BUN 19, creatinine 0.96.  Cholesterol was 141, triglycerides 195, HDL was low at 24, LDL 78.  Hemoglobin was 12, hematocrit 37.3, white count of 12.6, platelet count was 304,000.  Hemoglobin A1c was 7.8.  EKG showed normal sinus rhythm with first-degree AV block.  BRIEF HOSPITAL COURSE:  The patient was a.m. admit and underwent right and left cardiac cath with selective left and right coronary angiography and PTCA and stenting to mid and distal junction of RCA with excellent results.  The patient tolerated the procedure well.  There were no complications.  Postprocedure, the patient did not had any episodes of chest pain during the hospital stay.  Phase 1 cardiac rehab was called. The patient has been ambulating in hallway without any problems.  His groin is stable with no evidence of hematoma or bruit.  The patient will be discharged home on above medications and will be followed up in my office in 1 week.     Allegra Lai. Terrence Dupont, M.D.     MNH/MEDQ  D:  08/31/2013  T:  08/31/2013  Job:  [id number]

## 2013-08-31 NOTE — Progress Notes (Signed)
CARDIAC REHAB PHASE I   PRE:  Rate/Rhythm: 77SR  BP:  Supine:   Sitting: 178/85  Standing:    SaO2:   MODE:  Ambulation: 500 ft   POST:  Rate/Rhythm: 83 SR  BP:  Supine:   Sitting: 198/89, 165/98  Standing:    SaO2:  0850-1000 Pt walked 500 ft with steady gait. Stated he felt that his breathing is better. No CP. Tolerated well. Education completed with pt who voiced understanding. Has not been watching diet closely. Discussed watching carbs and heart healthy choices. Discussed CRP 2 which pt attended 17 years ago. Pt agreed to referral to [facility]. Strongly recommend program for pt as he needs help with diet and dietitan could work with pt in program. Pt stated he is limited in walking due to COPD. Encouraged starting out slow and gradually adding distance and stopping to catch his breath as needed.   Graylon Good, RN BSN  08/31/2013 9:56 AM

## 2013-09-18 ENCOUNTER — Encounter: Payer: Self-pay | Admitting: Family Medicine

## 2013-09-18 NOTE — Assessment & Plan Note (Signed)
Increased exertional fatigue suspicious for new obstructive heart disease, refer to cardiology for re eval, pot to go to ED if symptoms worsen

## 2013-09-18 NOTE — Assessment & Plan Note (Signed)
Unchnaged. Patient re-educated about  the importance of commitment to a  minimum of 150 minutes of exercise per week. The importance of healthy food choices with portion control discussed. Encouraged to start a food diary, count calories and to consider  joining a support group. Sample diet sheets offered. Goals set by the patient for the next several months.    

## 2013-09-18 NOTE — Assessment & Plan Note (Signed)
Improved, but tG still high and hDL low Hyperlipidemia:Low fat diet discussed and encouraged.  No med change

## 2013-09-18 NOTE — Assessment & Plan Note (Signed)
Still uncontrolled, has difficulty with medication being followed by endo and he has advised to be diligent with management per endo

## 2013-09-18 NOTE — Assessment & Plan Note (Signed)
Uncontrolled, increase dose of amlodipine DASH diet and commitment to daily physical activity for a minimum of 30 minutes discussed and encouraged, as a part of hypertension management. The importance of attaining a healthy weight is also discussed.  

## 2013-09-21 ENCOUNTER — Ambulatory Visit: Payer: Medicare Other | Admitting: Family Medicine

## 2013-09-27 ENCOUNTER — Other Ambulatory Visit: Payer: Self-pay | Admitting: Family Medicine

## 2013-10-03 ENCOUNTER — Other Ambulatory Visit: Payer: Self-pay | Admitting: Family Medicine

## 2013-10-14 ENCOUNTER — Encounter (HOSPITAL_COMMUNITY): Payer: Medicare Other

## 2013-12-08 ENCOUNTER — Encounter: Payer: Self-pay | Admitting: Family Medicine

## 2013-12-08 ENCOUNTER — Encounter (INDEPENDENT_AMBULATORY_CARE_PROVIDER_SITE_OTHER): Payer: Self-pay

## 2013-12-08 ENCOUNTER — Ambulatory Visit (INDEPENDENT_AMBULATORY_CARE_PROVIDER_SITE_OTHER): Payer: Medicare Other | Admitting: Family Medicine

## 2013-12-08 VITALS — BP 134/82 | HR 70 | Resp 18 | Wt 239.1 lb

## 2013-12-08 DIAGNOSIS — Z794 Long term (current) use of insulin: Secondary | ICD-10-CM

## 2013-12-08 DIAGNOSIS — E1165 Type 2 diabetes mellitus with hyperglycemia: Secondary | ICD-10-CM

## 2013-12-08 DIAGNOSIS — R0989 Other specified symptoms and signs involving the circulatory and respiratory systems: Secondary | ICD-10-CM

## 2013-12-08 DIAGNOSIS — Z0000 Encounter for general adult medical examination without abnormal findings: Secondary | ICD-10-CM

## 2013-12-08 DIAGNOSIS — Z23 Encounter for immunization: Secondary | ICD-10-CM

## 2013-12-08 DIAGNOSIS — E785 Hyperlipidemia, unspecified: Secondary | ICD-10-CM

## 2013-12-08 DIAGNOSIS — IMO0001 Reserved for inherently not codable concepts without codable children: Secondary | ICD-10-CM

## 2013-12-08 NOTE — Progress Notes (Signed)
Subjective:    Patient ID: [name], male    DOB: 08/31/1940, 73 y.o.   MRN: [id number]  HPI  Preventive Screening-Counseling & Management   Patient present here today for a subsequent  Medicare annual wellness visit. He does c/o 2 episodes of light headedness with near syncope on 2 occasions in the past 6 months,not related to change in position, and he does have established vascular disease, no record of carotid imaging on file, needs this   Current Problems (verified)   Medications Prior to Visit Allergies (verified)   PAST HISTORY  Family History (verified)  Social History (verified)Married father of 2 adult children, daughter and son, retired at age 89, was at [employer] for 29.5 years. Quit nicotine  approx at age 54, started at age 73 on avg 0.5 PPD No alcohol, , no street drugs   Risk Factors  Current exercise habits:  [name] , encouraged to increase to 5 days per week  For 30 mins as able  Dietary issues discussed: heart healthy/ low fat diet discussed carb restricted   Cardiac risk factors: established CAD  Depression Screen  (Note: if answer to either of the following is "Yes", a more complete depression screening is indicated)   Over the past two weeks, have you felt down, depressed or hopeless? No  Over the past two weeks, have you felt little interest or pleasure in doing things? No  Have you lost interest or pleasure in daily life? No  Do you often feel hopeless? No  Do you cry easily over simple problems? No   Activities of Daily Living  In your present state of health, do you have any difficulty performing the following activities?  Driving?: No Managing money?: No Feeding yourself?:No Getting from bed to chair?:No Climbing a flight of stairs?:No Preparing food and eating?:No Bathing or showering?:No Getting dressed?:No Getting to the toilet?:No Using the toilet?:No Moving around from place to place?: yes, due to SOB,   Fall Risk  Assessment In the past year have you fallen or had a near fall?:Yes, syncope x 2 months ago Are you currently taking any medications that make you dizzy?:No   Hearing Difficulties: No Do you often ask people to speak up or repeat themselves?:No Do you experience ringing or noises in your ears?:No Do you have difficulty understanding soft or whispered voices?:No  Cognitive Testing  Alert? Yes Normal Appearance?Yes  Oriented to person? Yes Place? Yes  Time? Yes  Displays appropriate judgment?Yes  Can read the correct time from a watch face? yes Are you having problems remembering things?No  Advanced Directives have been discussed with the patient?Yes, full code given pamphlet    List the Names of Other Physician/Practitioners you currently use: [facility]-pulmonology, Harwani-Cardio, Gershon Crane- Opthalmology , Dr Jeffie Pollock   Indicate any recent Medical Services you may have received from other than [facility] providers in the past year (date may be approximate).   Assessment:    Annual Wellness Exam   Plan:        .  Medicare Attestation  I have personally reviewed:  The patient's medical and social history  Their use of alcohol, tobacco or illicit drugs  Their current medications and supplements  The patient's functional ability including ADLs,fall risks, home safety risks, cognitive, and hearing and visual impairment  Diet and physical activities  Evidence for depression or mood disorders  The patient's weight, height, BMI, and visual acuity have been recorded in the chart. I have made referrals, counseling,  and provided education to the patient based on review of the above and I have provided the patient with a written personalized care plan for preventive services.     Review of Systems     Objective:   Physical Exam BP 134/82  Pulse 70  Resp 18  Wt 239 lb 1.3 oz (108.446 kg)  SpO2 98%  HEENT: EOMI, No facial asymetry. Neck supple, no JVD, bilateral carotid  Bruits, no  lymphadenopathy, external ear normal, TM clear bilaterally       Assessment & Plan:  Carotid bruit Physical exam on day of visit, pt has bruits and he has had 2 near syncopal episodes in the past 6 months, doppler ordered  Medicare annual wellness visit, subsequent Annual exam as documented. Counseling done  re healthy lifestyle involving commitment to 150 minutes exercise per week, heart healthy diet, and attaining healthy weight.The importance of adequate sleep also discussed. Regular seat belt use and safe storage  of firearms if patient has them, is also discussed. Changes in health habits are decided on by the patient with goals and time frames  set for achieving them. Immunization and cancer screening needs are specifically addressed at this visit.   Need for vaccination with 13-polyvalent pneumococcal conjugate vaccine Vaccine administered during the visit

## 2013-12-08 NOTE — Patient Instructions (Addendum)
F/u in  Early November, call if you need me before  Pneumonia vaccine today  You are much healthier , stick to healthy habits , and continue to work on them  PLS reschedule appt for eye exam, and you also need to see Dr Luan Pulling  Fasting lipid, cmp and EGFR in November before visit  You are referred for an [place] of your neck arteries to check circulation

## 2013-12-09 ENCOUNTER — Other Ambulatory Visit: Payer: Self-pay

## 2013-12-09 LAB — HEMOGLOBIN A1C
Hemoglobin-A1c: 6.9
LDL Cholesterol: 72 mg/dL

## 2013-12-09 MED ORDER — LISINOPRIL 40 MG PO TABS: 40.0000 mg | ORAL_TABLET | Freq: Every day | ORAL | Status: DC

## 2013-12-09 MED ORDER — ATORVASTATIN CALCIUM 20 MG PO TABS: ORAL_TABLET | ORAL | Status: DC

## 2013-12-11 ENCOUNTER — Encounter: Payer: Self-pay | Admitting: Family Medicine

## 2013-12-11 DIAGNOSIS — Z0000 Encounter for general adult medical examination without abnormal findings: Secondary | ICD-10-CM | POA: Insufficient documentation

## 2013-12-11 DIAGNOSIS — Z23 Encounter for immunization: Secondary | ICD-10-CM | POA: Insufficient documentation

## 2013-12-11 HISTORY — DX: Encounter for general adult medical examination without abnormal findings: Z00.00

## 2013-12-11 NOTE — Assessment & Plan Note (Signed)
Vaccine administered during the visit

## 2013-12-11 NOTE — Assessment & Plan Note (Signed)
Physical exam on day of visit, pt has bruits and he has had 2 near syncopal episodes in the past 6 months, doppler ordered

## 2013-12-11 NOTE — Assessment & Plan Note (Signed)
Annual exam as documented. Counseling done  re healthy lifestyle involving commitment to 150 minutes exercise per week, heart healthy diet, and attaining healthy weight.The importance of adequate sleep also discussed. Regular seat belt use and safe storage  of firearms if patient has them, is also discussed. Changes in health habits are decided on by the patient with goals and time frames  set for achieving them. Immunization and cancer screening needs are specifically addressed at this visit.  

## 2014-03-01 ENCOUNTER — Other Ambulatory Visit: Payer: Self-pay | Admitting: Family Medicine

## 2014-03-16 ENCOUNTER — Telehealth: Payer: Self-pay | Admitting: Family Medicine

## 2014-03-16 DIAGNOSIS — Z794 Long term (current) use of insulin: Principal | ICD-10-CM

## 2014-03-16 DIAGNOSIS — IMO0001 Reserved for inherently not codable concepts without codable children: Secondary | ICD-10-CM

## 2014-03-16 DIAGNOSIS — E785 Hyperlipidemia, unspecified: Secondary | ICD-10-CM

## 2014-03-16 DIAGNOSIS — E1165 Type 2 diabetes mellitus with hyperglycemia: Principal | ICD-10-CM

## 2014-03-16 NOTE — Telephone Encounter (Signed)
pls contact pt, he needs fasting lipid, cmp and EGFR, and microalb by next week Monday, has appt on 11/04 Also let him know [employer] has notified me he does not appear to be taking lipitor $RemoveBefore'20mg'[id number]$  as prescribed , needs t do this as a part of his heart protection management

## 2014-03-21 NOTE — Telephone Encounter (Signed)
Spoke with patient and he has eaten today already.  Will have labs done on 11/4.  His appointment in 11/5.  Aware that he should be taking lipitor and this will be further discussed at ov.

## 2014-03-21 NOTE — Addendum Note (Signed)
Addended by: Denman George B on: 03/21/2014 10:21 AM   Modules accepted: Orders

## 2014-03-22 ENCOUNTER — Other Ambulatory Visit: Payer: Self-pay | Admitting: Family Medicine

## 2014-03-22 LAB — COMPLETE METABOLIC PANEL WITH GFR
ALBUMIN: 3.7 g/dL (ref 3.5–5.2)
ALT: 10 U/L (ref 0–53)
AST: 12 U/L (ref 0–37)
Alkaline Phosphatase: 91 U/L (ref 39–117)
BUN: 15 mg/dL (ref 6–23)
CHLORIDE: 102 meq/L (ref 96–112)
CO2: 26 mEq/L (ref 19–32)
Calcium: 9 mg/dL (ref 8.4–10.5)
Creat: 0.95 mg/dL (ref 0.50–1.35)
GFR, EST NON AFRICAN AMERICAN: 80 mL/min
GFR, Est African American: >89 mL/min
Glucose, Bld: 123 mg/dL — ABNORMAL HIGH (ref 70–99)
Potassium: 4 mEq/L (ref 3.5–5.3)
SODIUM: 138 meq/L (ref 135–145)
TOTAL PROTEIN: 6.8 g/dL (ref 6.0–8.3)
Total Bilirubin: 0.3 mg/dL (ref 0.2–1.2)

## 2014-03-22 LAB — LIPID PANEL
Cholesterol: 161 mg/dL (ref 0–200)
HDL: 20 mg/dL — ABNORMAL LOW (ref >39)
Total CHOL/HDL Ratio: 8.1 Ratio
Triglycerides: 545 mg/dL — ABNORMAL HIGH (ref <150)

## 2014-03-23 ENCOUNTER — Encounter: Payer: Self-pay | Admitting: Family Medicine

## 2014-03-23 ENCOUNTER — Ambulatory Visit (INDEPENDENT_AMBULATORY_CARE_PROVIDER_SITE_OTHER): Payer: Medicare Other | Admitting: Family Medicine

## 2014-03-23 VITALS — BP 138/72 | HR 89 | Resp 16 | Ht 71.0 in | Wt 243.0 lb

## 2014-03-23 DIAGNOSIS — I10 Essential (primary) hypertension: Secondary | ICD-10-CM

## 2014-03-23 DIAGNOSIS — E038 Other specified hypothyroidism: Secondary | ICD-10-CM

## 2014-03-23 DIAGNOSIS — E669 Obesity, unspecified: Secondary | ICD-10-CM

## 2014-03-23 DIAGNOSIS — E1065 Type 1 diabetes mellitus with hyperglycemia: Secondary | ICD-10-CM

## 2014-03-23 DIAGNOSIS — IMO0001 Reserved for inherently not codable concepts without codable children: Secondary | ICD-10-CM

## 2014-03-23 DIAGNOSIS — E66811 Obesity, class 1: Secondary | ICD-10-CM

## 2014-03-23 DIAGNOSIS — E785 Hyperlipidemia, unspecified: Secondary | ICD-10-CM

## 2014-03-23 DIAGNOSIS — J449 Chronic obstructive pulmonary disease, unspecified: Secondary | ICD-10-CM

## 2014-03-23 DIAGNOSIS — E790 Hyperuricemia without signs of inflammatory arthritis and tophaceous disease: Secondary | ICD-10-CM

## 2014-03-23 DIAGNOSIS — E1165 Type 2 diabetes mellitus with hyperglycemia: Principal | ICD-10-CM

## 2014-03-23 DIAGNOSIS — D539 Nutritional anemia, unspecified: Secondary | ICD-10-CM

## 2014-03-23 DIAGNOSIS — Z794 Long term (current) use of insulin: Principal | ICD-10-CM

## 2014-03-23 DIAGNOSIS — E1121 Type 2 diabetes mellitus with diabetic nephropathy: Secondary | ICD-10-CM

## 2014-03-23 LAB — LDL CHOLESTEROL, DIRECT: Direct LDL: 78 mg/dL

## 2014-03-23 MED ORDER — LISINOPRIL 40 MG PO TABS: 40.0000 mg | ORAL_TABLET | Freq: Every day | ORAL | Status: DC

## 2014-03-23 MED ORDER — PRAVASTATIN SODIUM 20 MG PO TABS: 20.0000 mg | ORAL_TABLET | Freq: Every day | ORAL | Status: DC

## 2014-03-23 NOTE — Progress Notes (Signed)
   Subjective:    Patient ID: [name], male    DOB: Jul 12, 1940, 73 y.o.   MRN: [id number]  HPI The PT is here for follow up and re-evaluation of chronic medical conditions, medication management and review of any available recent lab and radiology data.  Preventive health is updated, specifically  Cancer screening and Immunization.   Questions or concerns regarding consultations or procedures which the PT has had in the interim are  addressed. The PT denies any adverse reactions to current medications since the last visit.  There are no new concerns.  There are no specific complaints  Denies polyuria, polydipsia, blurred vision , or hypoglycemic episodes.       Review of Systems See HPI Denies recent fever or chills. Denies sinus pressure, nasal congestion, ear pain or sore throat. Denies chest congestion, productive cough or wheezing. Denies chest pains, palpitations and leg swelling Denies abdominal pain, nausea, vomiting,diarrhea or constipation.   Denies dysuria, frequency, hesitancy or incontinence. Denies joint pain, swelling and limitation in mobility. Denies headaches, seizures, numbness, or tingling. Denies depression, anxiety or insomnia. Denies skin break down or rash.        Objective:   Physical Exam  BP 138/72 mmHg  Pulse 89  Resp 16  Ht 5\' 11"  (1.803 m)  Wt 243 lb (110.224 kg)  BMI 33.91 kg/m2  SpO2 92% Patient alert and oriented and in no cardiopulmonary distress.  HEENT: No facial asymmetry, EOMI,   oropharynx pink and moist.  Neck supple no JVD, no mass.  Chest: Clear to auscultation bilaterally.  CVS: S1, S2 no murmurs, no S3.Regular rate.  ABD: Soft non tender.   Ext: No edema  MS: Adequate ROM spine, shoulders, hips and knees.  Skin: Intact, no ulcerations or rash noted.  Psych: Good eye contact, normal affect. Memory intact not anxious or depressed appearing.  CNS: CN 2-12 intact, power,  normal throughout.no focal deficits  noted.       Assessment & Plan:  Essential hypertension Controlled, no change in medication DASH diet and commitment to daily physical activity for a minimum of 30 minutes discussed and encouraged, as a part of hypertension management. The importance of attaining a healthy weight is also discussed.    Diabetes mellitus, insulin dependent (IDDM), uncontrolled Improving under the care of endo, pt to continue same Patient advised to reduce carb and sweets, commit to regular physical activity, take meds as prescribed, test blood as directed, and attempt to lose weight, to improve blood sugar control.     Hyperlipidemia with target LDL less than 70 Uncontrolled Low fat diet discussed and encouraged Updated lab needed at/ before next visit.    Obesity (BMI 30.0-34.9) Unchnaged Patient re-educated about  the importance of commitment to a  minimum of 150 minutes of exercise per week. The importance of healthy food choices with portion control discussed. Encouraged to start a food diary, count calories and to consider  joining a support group. Sample diet sheets offered. Goals set by the patient for the next several months.      Hypothyroid Managed and treated by endo

## 2014-03-23 NOTE — Patient Instructions (Addendum)
F/u in 3.5 month, call if you need me before  New med sent for cholesterol pravachol take every evening, and reduce cheese, butter, fried and fatty foods  We will send microalb, and also see if your oxygen falls too low after you walk for a short time, not indicated, you did very well!     Fasting lipid, cBc, iron and ferritin,cmp and eGFr, CBc and uric acid level in 4 month  You are referred to Dr Gershon Crane for eye exam

## 2014-03-24 LAB — MICROALBUMIN / CREATININE URINE RATIO
CREATININE, URINE: 213 mg/dL
MICROALB UR: 7.8 mg/dL — AB (ref <2.0)
Microalb Creat Ratio: 36.6 mg/g — ABNORMAL HIGH (ref 0.0–30.0)

## 2014-04-18 LAB — HM DIABETES EYE EXAM

## 2014-04-27 ENCOUNTER — Encounter (HOSPITAL_COMMUNITY): Payer: Self-pay | Admitting: Cardiology

## 2014-05-25 DIAGNOSIS — E782 Mixed hyperlipidemia: Secondary | ICD-10-CM | POA: Diagnosis not present

## 2014-05-25 DIAGNOSIS — I10 Essential (primary) hypertension: Secondary | ICD-10-CM | POA: Diagnosis not present

## 2014-05-25 DIAGNOSIS — E1165 Type 2 diabetes mellitus with hyperglycemia: Secondary | ICD-10-CM | POA: Diagnosis not present

## 2014-05-25 DIAGNOSIS — E6609 Other obesity due to excess calories: Secondary | ICD-10-CM | POA: Diagnosis not present

## 2014-05-25 DIAGNOSIS — E039 Hypothyroidism, unspecified: Secondary | ICD-10-CM | POA: Diagnosis not present

## 2014-05-26 LAB — HEMOGLOBIN A1C: A1c: 8.1

## 2014-06-01 ENCOUNTER — Encounter (HOSPITAL_COMMUNITY): Payer: Self-pay | Admitting: Urology

## 2014-06-03 DIAGNOSIS — R0602 Shortness of breath: Secondary | ICD-10-CM | POA: Diagnosis not present

## 2014-06-03 DIAGNOSIS — R0902 Hypoxemia: Secondary | ICD-10-CM | POA: Diagnosis not present

## 2014-06-13 NOTE — Assessment & Plan Note (Signed)
Improving under the care of endo, pt to continue same Patient advised to reduce carb and sweets, commit to regular physical activity, take meds as prescribed, test blood as directed, and attempt to lose weight, to improve blood sugar control.

## 2014-06-13 NOTE — Assessment & Plan Note (Signed)
Unchnaged. Patient re-educated about  the importance of commitment to a  minimum of 150 minutes of exercise per week. The importance of healthy food choices with portion control discussed. Encouraged to start a food diary, count calories and to consider  joining a support group. Sample diet sheets offered. Goals set by the patient for the next several months.    

## 2014-06-13 NOTE — Assessment & Plan Note (Signed)
Uncontrolled Low fat diet discussed and encouraged Updated lab needed at/ before next visit.

## 2014-06-13 NOTE — Assessment & Plan Note (Signed)
Managed and treated by endo

## 2014-06-13 NOTE — Assessment & Plan Note (Signed)
Controlled, no change in medication DASH diet and commitment to daily physical activity for a minimum of 30 minutes discussed and encouraged, as a part of hypertension management. The importance of attaining a healthy weight is also discussed.  

## 2014-06-14 DIAGNOSIS — D539 Nutritional anemia, unspecified: Secondary | ICD-10-CM | POA: Diagnosis not present

## 2014-06-14 DIAGNOSIS — E1121 Type 2 diabetes mellitus with diabetic nephropathy: Secondary | ICD-10-CM | POA: Diagnosis not present

## 2014-06-14 DIAGNOSIS — E790 Hyperuricemia without signs of inflammatory arthritis and tophaceous disease: Secondary | ICD-10-CM | POA: Diagnosis not present

## 2014-06-14 DIAGNOSIS — E785 Hyperlipidemia, unspecified: Secondary | ICD-10-CM | POA: Diagnosis not present

## 2014-06-14 LAB — CBC
HCT: 37 % — ABNORMAL LOW (ref 39.0–52.0)
HEMOGLOBIN: 12.2 g/dL — AB (ref 13.0–17.0)
MCH: 26.3 pg (ref 26.0–34.0)
MCHC: 33 g/dL (ref 30.0–36.0)
MCV: 79.7 fL (ref 78.0–100.0)
PLATELETS: 274 10*3/uL (ref 150–400)
RBC: 4.64 MIL/uL (ref 4.22–5.81)
RDW: 14.8 % (ref 11.5–15.5)
WBC: 13.5 10*3/uL — ABNORMAL HIGH (ref 4.0–10.5)

## 2014-06-14 LAB — URIC ACID: Uric Acid, Serum: 5 mg/dL (ref 4.0–7.8)

## 2014-06-14 LAB — LIPID PANEL
CHOL/HDL RATIO: 4.5 ratio
Cholesterol: 134 mg/dL (ref 0–200)
HDL: 30 mg/dL — ABNORMAL LOW (ref >39)
LDL Cholesterol: 37 mg/dL (ref 0–99)
Triglycerides: 333 mg/dL — ABNORMAL HIGH (ref <150)
VLDL: 67 mg/dL — AB (ref 0–40)

## 2014-06-14 LAB — COMPLETE METABOLIC PANEL WITH GFR
ALK PHOS: 89 U/L (ref 39–117)
ALT: 9 U/L (ref 0–53)
AST: 11 U/L (ref 0–37)
Albumin: 3.9 g/dL (ref 3.5–5.2)
BUN: 12 mg/dL (ref 6–23)
CALCIUM: 9.2 mg/dL (ref 8.4–10.5)
CHLORIDE: 100 meq/L (ref 96–112)
CO2: 27 mEq/L (ref 19–32)
CREATININE: 0.91 mg/dL (ref 0.50–1.35)
GFR, EST NON AFRICAN AMERICAN: 84 mL/min
GFR, Est African American: >89 mL/min
GLUCOSE: 191 mg/dL — AB (ref 70–99)
POTASSIUM: 4.1 meq/L (ref 3.5–5.3)
Sodium: 137 mEq/L (ref 135–145)
Total Bilirubin: 0.3 mg/dL (ref 0.2–1.2)
Total Protein: 6.8 g/dL (ref 6.0–8.3)

## 2014-06-14 LAB — IRON: Iron: 51 ug/dL (ref 42–165)

## 2014-06-14 LAB — FERRITIN: Ferritin: 186 ng/mL (ref 22–322)

## 2014-06-21 ENCOUNTER — Encounter: Payer: Self-pay | Admitting: Family Medicine

## 2014-06-21 ENCOUNTER — Ambulatory Visit (INDEPENDENT_AMBULATORY_CARE_PROVIDER_SITE_OTHER): Payer: Commercial Managed Care - HMO | Admitting: Family Medicine

## 2014-06-21 VITALS — BP 142/80 | HR 71 | Resp 16 | Ht 71.0 in | Wt 241.0 lb

## 2014-06-21 DIAGNOSIS — J42 Unspecified chronic bronchitis: Secondary | ICD-10-CM | POA: Diagnosis not present

## 2014-06-21 DIAGNOSIS — I150 Renovascular hypertension: Secondary | ICD-10-CM

## 2014-06-21 DIAGNOSIS — E785 Hyperlipidemia, unspecified: Secondary | ICD-10-CM | POA: Diagnosis not present

## 2014-06-21 DIAGNOSIS — Z125 Encounter for screening for malignant neoplasm of prostate: Secondary | ICD-10-CM | POA: Diagnosis not present

## 2014-06-21 DIAGNOSIS — IMO0001 Reserved for inherently not codable concepts without codable children: Secondary | ICD-10-CM

## 2014-06-21 DIAGNOSIS — R0600 Dyspnea, unspecified: Secondary | ICD-10-CM | POA: Diagnosis not present

## 2014-06-21 DIAGNOSIS — I25118 Atherosclerotic heart disease of native coronary artery with other forms of angina pectoris: Secondary | ICD-10-CM

## 2014-06-21 DIAGNOSIS — J441 Chronic obstructive pulmonary disease with (acute) exacerbation: Secondary | ICD-10-CM | POA: Diagnosis not present

## 2014-06-21 DIAGNOSIS — E1065 Type 1 diabetes mellitus with hyperglycemia: Secondary | ICD-10-CM

## 2014-06-21 DIAGNOSIS — E1165 Type 2 diabetes mellitus with hyperglycemia: Secondary | ICD-10-CM

## 2014-06-21 DIAGNOSIS — B351 Tinea unguium: Secondary | ICD-10-CM

## 2014-06-21 DIAGNOSIS — Z794 Long term (current) use of insulin: Secondary | ICD-10-CM

## 2014-06-21 DIAGNOSIS — G4734 Idiopathic sleep related nonobstructive alveolar hypoventilation: Secondary | ICD-10-CM

## 2014-06-21 DIAGNOSIS — I10 Essential (primary) hypertension: Secondary | ICD-10-CM

## 2014-06-21 MED ORDER — PREDNISONE (PAK) 5 MG PO TABS: 5.0000 mg | ORAL_TABLET | ORAL | Status: DC

## 2014-06-21 MED ORDER — ALBUTEROL SULFATE (2.5 MG/3ML) 0.083% IN NEBU
2.5000 mg | INHALATION_SOLUTION | Freq: Once | RESPIRATORY_TRACT | Status: AC
  Administered 2014-06-21: 2.5 mg via RESPIRATORY_TRACT

## 2014-06-21 MED ORDER — IPRATROPIUM BROMIDE 0.02 % IN SOLN
0.5000 mg | Freq: Once | RESPIRATORY_TRACT | Status: AC
  Administered 2014-06-21: 0.5 mg via RESPIRATORY_TRACT

## 2014-06-21 NOTE — Assessment & Plan Note (Addendum)
Neb treatment, uncontrolled with increased exertional dyspnea and SOB Refer to pulmonary for re eval

## 2014-06-21 NOTE — Patient Instructions (Addendum)
Annual physical exam in 4 month, call if you need me before  Neb treatmkent today and 6 day course of prednisone to help breathing  You are referred to Dr Johnell Comings are referred to Dr Terrence Dupont  Handicap sticker  CUT BACK on cheese, butter, egg yold, fried foods and red meat please, triglycerides (fat) are twice as high as they shoyuld be  Liver and kidneys look good  Fill script sent in Septmeber for the fungal infection of feet and toenails  Fasting lipid, cmp and EGFR in 4 month

## 2014-06-21 NOTE — Progress Notes (Signed)
   Subjective:    Patient ID: [name], male    DOB: [birth year], 74 y.o.   MRN: [id number]  HPI The PT is here for follow up and re-evaluation of chronic medical conditions, medication management and review of any available recent lab and radiology data.  Preventive health is updated, specifically  Cancer screening and Immunization.   Questions or concerns regarding consultations or procedures which the PT has had in the interim are  addressed. The PT denies any adverse reactions to current medications since the last visit.  C/o increased exertional dyspnea and exercise intolerance in the past 3 to 5 weeks. denies specifically chest pain, experiences the symptom more as an airway problem Denies polyuria, polydipsia, blurred vision , or hypoglycemic episodes. Requests referral to cardiology and pulmonary nboth of which are very appropriate for continuity of care esp in light of reported decompensation        Review of Systems See HPI Denies recent fever or chills. Denies sinus pressure, nasal congestion, ear pain or sore throat. Denies chest congestion, productive cough, doe have chest tightness and  wheezing. Denies chest pains, palpitations, does have orthopnea  And mild  leg swelling Denies abdominal pain, nausea, vomiting,diarrhea or constipation.   Denies dysuria, frequency, hesitancy or incontinence. C/o chronic  joint pain, and limitation in mobility. Denies headaches, seizures, numbness, or tingling. Denies depression, anxiety or insomnia. Cjronic rash improved     Objective:   Physical Exam BP 142/80 mmHg  Pulse 71  Resp 16  Ht 5\' 11"  (1.803 m)  Wt 241 lb (109.317 kg)  BMI 33.63 kg/m2  SpO2 93% Patient alert and oriented and in no cardiopulmonary distress.  HEENT: No facial asymmetry, EOMI,   oropharynx pink and moist.  Neck supple no JVD, no mass.  Chest: decreased air entry with bilateral wheezes no crackles  CVS: S1, S2 no murmurs, no S3.Regular  rate.  ABD: Soft non tender.   Ext: trace edema  MS: Adequate thiugh reduced ROM spine, shoulders, hips and knees.  Skin: severe tinea pedis,  Onychomycosis and psoriasis of knees improved.  Psych: Good eye contact, normal affect. Memory intact not anxious or depressed appearing.  CNS: CN 2-12 intact, power,  normal throughout.no focal deficits noted.        Assessment & Plan:  COPD (chronic obstructive pulmonary disease) Neb treatment, uncontrolled with increased exertional dyspnea and SOB Refer to pulmonary for re eval   Essential hypertension Uncontrolled.Med adjustment made DASH diet and commitment to daily physical activity for a minimum of 30 minutes discussed and encouraged, as a part of hypertension management. The importance of attaining a healthy weight is also discussed.    Diabetes mellitus, insulin dependent (IDDM), uncontrolled  vastly improved treated by endo    Onychomycosis of toenail Severe , needs re treatment with oral agent also extensive bilteral tinea pedis   Sleep related hypoxia Relies on supplemental oxygen while asleep, which he does use

## 2014-06-24 NOTE — Assessment & Plan Note (Signed)
Relies on supplemental oxygen while asleep, which he does use

## 2014-06-24 NOTE — Assessment & Plan Note (Signed)
vastly improved treated by endo

## 2014-06-24 NOTE — Assessment & Plan Note (Signed)
Uncontrolled.Med adjustment made DASH diet and commitment to daily physical activity for a minimum of 30 minutes discussed and encouraged, as a part of hypertension management. The importance of attaining a healthy weight is also discussed.

## 2014-06-24 NOTE — Assessment & Plan Note (Signed)
Severe , needs re treatment with oral agent also extensive bilteral tinea pedis

## 2014-06-29 DIAGNOSIS — I252 Old myocardial infarction: Secondary | ICD-10-CM | POA: Diagnosis not present

## 2014-06-29 DIAGNOSIS — I5030 Unspecified diastolic (congestive) heart failure: Secondary | ICD-10-CM | POA: Diagnosis not present

## 2014-06-29 DIAGNOSIS — E785 Hyperlipidemia, unspecified: Secondary | ICD-10-CM | POA: Diagnosis not present

## 2014-06-29 DIAGNOSIS — I10 Essential (primary) hypertension: Secondary | ICD-10-CM | POA: Diagnosis not present

## 2014-06-29 DIAGNOSIS — M1000 Idiopathic gout, unspecified site: Secondary | ICD-10-CM | POA: Diagnosis not present

## 2014-06-29 DIAGNOSIS — I209 Angina pectoris, unspecified: Secondary | ICD-10-CM | POA: Diagnosis not present

## 2014-06-29 DIAGNOSIS — I2510 Atherosclerotic heart disease of native coronary artery without angina pectoris: Secondary | ICD-10-CM | POA: Diagnosis not present

## 2014-06-29 DIAGNOSIS — E119 Type 2 diabetes mellitus without complications: Secondary | ICD-10-CM | POA: Diagnosis not present

## 2014-07-04 DIAGNOSIS — R0902 Hypoxemia: Secondary | ICD-10-CM | POA: Diagnosis not present

## 2014-07-04 DIAGNOSIS — R0602 Shortness of breath: Secondary | ICD-10-CM | POA: Diagnosis not present

## 2014-07-06 DIAGNOSIS — R062 Wheezing: Secondary | ICD-10-CM | POA: Diagnosis not present

## 2014-07-06 DIAGNOSIS — I10 Essential (primary) hypertension: Secondary | ICD-10-CM | POA: Diagnosis not present

## 2014-07-06 DIAGNOSIS — E119 Type 2 diabetes mellitus without complications: Secondary | ICD-10-CM | POA: Diagnosis not present

## 2014-07-06 DIAGNOSIS — J449 Chronic obstructive pulmonary disease, unspecified: Secondary | ICD-10-CM | POA: Diagnosis not present

## 2014-08-02 DIAGNOSIS — R0602 Shortness of breath: Secondary | ICD-10-CM | POA: Diagnosis not present

## 2014-08-02 DIAGNOSIS — R0902 Hypoxemia: Secondary | ICD-10-CM | POA: Diagnosis not present

## 2014-08-18 ENCOUNTER — Other Ambulatory Visit: Payer: Self-pay | Admitting: Family Medicine

## 2014-08-18 DIAGNOSIS — I10 Essential (primary) hypertension: Secondary | ICD-10-CM | POA: Diagnosis not present

## 2014-08-18 DIAGNOSIS — E1165 Type 2 diabetes mellitus with hyperglycemia: Secondary | ICD-10-CM | POA: Diagnosis not present

## 2014-08-18 DIAGNOSIS — E109 Type 1 diabetes mellitus without complications: Secondary | ICD-10-CM | POA: Diagnosis not present

## 2014-08-18 DIAGNOSIS — Z713 Dietary counseling and surveillance: Secondary | ICD-10-CM | POA: Diagnosis not present

## 2014-08-18 DIAGNOSIS — Z125 Encounter for screening for malignant neoplasm of prostate: Secondary | ICD-10-CM | POA: Diagnosis not present

## 2014-08-18 DIAGNOSIS — E785 Hyperlipidemia, unspecified: Secondary | ICD-10-CM | POA: Diagnosis not present

## 2014-08-18 DIAGNOSIS — E782 Mixed hyperlipidemia: Secondary | ICD-10-CM | POA: Diagnosis not present

## 2014-08-18 DIAGNOSIS — E1065 Type 1 diabetes mellitus with hyperglycemia: Secondary | ICD-10-CM | POA: Diagnosis not present

## 2014-08-19 LAB — LIPID PANEL
CHOLESTEROL: 148 mg/dL (ref 0–200)
HDL: 17 mg/dL — AB (ref >=40)
TRIGLYCERIDES: 724 mg/dL — AB (ref <150)
Total CHOL/HDL Ratio: 8.7 Ratio

## 2014-08-19 LAB — COMPLETE METABOLIC PANEL WITH GFR
ALK PHOS: 108 U/L (ref 39–117)
ALT: 11 U/L (ref 0–53)
AST: 12 U/L (ref 0–37)
Albumin: 3.7 g/dL (ref 3.5–5.2)
BUN: 13 mg/dL (ref 6–23)
CHLORIDE: 100 meq/L (ref 96–112)
CO2: 23 meq/L (ref 19–32)
CREATININE: 0.91 mg/dL (ref 0.50–1.35)
Calcium: 9 mg/dL (ref 8.4–10.5)
GFR, EST NON AFRICAN AMERICAN: 84 mL/min
GFR, Est African American: >89 mL/min
Glucose, Bld: 281 mg/dL — ABNORMAL HIGH (ref 70–99)
Potassium: 4.1 mEq/L (ref 3.5–5.3)
SODIUM: 135 meq/L (ref 135–145)
Total Bilirubin: 0.3 mg/dL (ref 0.2–1.2)
Total Protein: 6.6 g/dL (ref 6.0–8.3)

## 2014-08-19 LAB — PSA, MEDICARE: PSA: 0.9 ng/mL (ref <=4.00)

## 2014-08-21 ENCOUNTER — Other Ambulatory Visit: Payer: Self-pay | Admitting: Family Medicine

## 2014-08-21 LAB — HEMOGLOBIN A1C: A1c: 9

## 2014-08-21 LAB — LDL CHOLESTEROL, DIRECT: LDL DIRECT: 50 mg/dL

## 2014-08-24 ENCOUNTER — Other Ambulatory Visit: Payer: Self-pay | Admitting: Family Medicine

## 2014-08-24 ENCOUNTER — Telehealth: Payer: Self-pay | Admitting: Family Medicine

## 2014-08-24 NOTE — Telephone Encounter (Signed)
Thanks for med printout. I have added fenofibrate  And increased his pravachol dose from 20 mg to 40 mg daily, both are entered historically. Pls contact pt, make him aware of abnormal lipid labs, need to change diet , and the changes in his meds, pls,s fax in after you spk with him, both are entered historically, thanks  ??pls ask

## 2014-08-25 NOTE — Telephone Encounter (Signed)
Multiple attempts made to reach patient.  Letter sent for him to contact office.  See result note.

## 2014-08-28 ENCOUNTER — Other Ambulatory Visit: Payer: Self-pay | Admitting: Family Medicine

## 2014-08-28 DIAGNOSIS — I252 Old myocardial infarction: Secondary | ICD-10-CM | POA: Diagnosis not present

## 2014-08-28 DIAGNOSIS — I5030 Unspecified diastolic (congestive) heart failure: Secondary | ICD-10-CM | POA: Diagnosis not present

## 2014-08-28 DIAGNOSIS — E785 Hyperlipidemia, unspecified: Secondary | ICD-10-CM | POA: Diagnosis not present

## 2014-08-28 DIAGNOSIS — I2510 Atherosclerotic heart disease of native coronary artery without angina pectoris: Secondary | ICD-10-CM | POA: Diagnosis not present

## 2014-08-28 DIAGNOSIS — E669 Obesity, unspecified: Secondary | ICD-10-CM | POA: Diagnosis not present

## 2014-08-28 DIAGNOSIS — I10 Essential (primary) hypertension: Secondary | ICD-10-CM | POA: Diagnosis not present

## 2014-08-28 DIAGNOSIS — E119 Type 2 diabetes mellitus without complications: Secondary | ICD-10-CM | POA: Diagnosis not present

## 2014-08-28 DIAGNOSIS — I209 Angina pectoris, unspecified: Secondary | ICD-10-CM | POA: Diagnosis not present

## 2014-08-31 ENCOUNTER — Other Ambulatory Visit: Payer: Self-pay

## 2014-08-31 MED ORDER — FENOFIBRATE MICRONIZED 67 MG PO CAPS: 67.0000 mg | ORAL_CAPSULE | Freq: Every day | ORAL | Status: DC

## 2014-09-02 DIAGNOSIS — R0602 Shortness of breath: Secondary | ICD-10-CM | POA: Diagnosis not present

## 2014-09-02 DIAGNOSIS — R0902 Hypoxemia: Secondary | ICD-10-CM | POA: Diagnosis not present

## 2014-09-06 ENCOUNTER — Other Ambulatory Visit: Payer: Self-pay

## 2014-09-06 MED ORDER — PRAVASTATIN SODIUM 40 MG PO TABS: 40.0000 mg | ORAL_TABLET | Freq: Every day | ORAL | Status: DC

## 2014-09-06 MED ORDER — AMLODIPINE BESYLATE 10 MG PO TABS: 10.0000 mg | ORAL_TABLET | Freq: Every day | ORAL | Status: DC

## 2014-09-06 MED ORDER — LISINOPRIL 40 MG PO TABS: 40.0000 mg | ORAL_TABLET | Freq: Every day | ORAL | Status: DC

## 2014-09-20 ENCOUNTER — Other Ambulatory Visit: Payer: Self-pay

## 2014-09-20 MED ORDER — AMLODIPINE BESYLATE 10 MG PO TABS: 10.0000 mg | ORAL_TABLET | Freq: Every day | ORAL | Status: DC

## 2014-09-20 MED ORDER — PRAVASTATIN SODIUM 40 MG PO TABS: 40.0000 mg | ORAL_TABLET | Freq: Every day | ORAL | Status: DC

## 2014-09-20 MED ORDER — LISINOPRIL 40 MG PO TABS: 40.0000 mg | ORAL_TABLET | Freq: Every day | ORAL | Status: DC

## 2014-10-02 DIAGNOSIS — R0902 Hypoxemia: Secondary | ICD-10-CM | POA: Diagnosis not present

## 2014-10-02 DIAGNOSIS — R0602 Shortness of breath: Secondary | ICD-10-CM | POA: Diagnosis not present

## 2014-11-09 ENCOUNTER — Encounter: Payer: Self-pay | Admitting: *Deleted

## 2014-11-09 ENCOUNTER — Encounter: Payer: Commercial Managed Care - HMO | Admitting: Family Medicine

## 2014-11-13 ENCOUNTER — Encounter (HOSPITAL_COMMUNITY): Payer: Self-pay | Admitting: *Deleted

## 2014-11-13 ENCOUNTER — Emergency Department (HOSPITAL_COMMUNITY): Payer: Commercial Managed Care - HMO

## 2014-11-13 ENCOUNTER — Ambulatory Visit (INDEPENDENT_AMBULATORY_CARE_PROVIDER_SITE_OTHER): Payer: Commercial Managed Care - HMO | Admitting: Family Medicine

## 2014-11-13 ENCOUNTER — Encounter: Payer: Self-pay | Admitting: Family Medicine

## 2014-11-13 ENCOUNTER — Other Ambulatory Visit: Payer: Self-pay | Admitting: Family Medicine

## 2014-11-13 ENCOUNTER — Emergency Department (HOSPITAL_COMMUNITY)
Admission: EM | Admit: 2014-11-13 | Discharge: 2014-11-13 | Disposition: A | Discharge status: Home / Self Care | Payer: Commercial Managed Care - HMO | Attending: Emergency Medicine | Admitting: Emergency Medicine

## 2014-11-13 VITALS — BP 130/64 | HR 87 | Temp 99.2°F | Resp 16 | Ht 71.0 in | Wt 230.1 lb

## 2014-11-13 DIAGNOSIS — I2510 Atherosclerotic heart disease of native coronary artery without angina pectoris: Secondary | ICD-10-CM | POA: Diagnosis not present

## 2014-11-13 DIAGNOSIS — E785 Hyperlipidemia, unspecified: Secondary | ICD-10-CM | POA: Insufficient documentation

## 2014-11-13 DIAGNOSIS — Z794 Long term (current) use of insulin: Secondary | ICD-10-CM

## 2014-11-13 DIAGNOSIS — J441 Chronic obstructive pulmonary disease with (acute) exacerbation: Secondary | ICD-10-CM | POA: Insufficient documentation

## 2014-11-13 DIAGNOSIS — M109 Gout, unspecified: Secondary | ICD-10-CM | POA: Diagnosis not present

## 2014-11-13 DIAGNOSIS — Z872 Personal history of diseases of the skin and subcutaneous tissue: Secondary | ICD-10-CM | POA: Insufficient documentation

## 2014-11-13 DIAGNOSIS — N3000 Acute cystitis without hematuria: Secondary | ICD-10-CM

## 2014-11-13 DIAGNOSIS — R0602 Shortness of breath: Secondary | ICD-10-CM | POA: Diagnosis not present

## 2014-11-13 DIAGNOSIS — K219 Gastro-esophageal reflux disease without esophagitis: Secondary | ICD-10-CM | POA: Insufficient documentation

## 2014-11-13 DIAGNOSIS — Z9981 Dependence on supplemental oxygen: Secondary | ICD-10-CM | POA: Diagnosis not present

## 2014-11-13 DIAGNOSIS — G8929 Other chronic pain: Secondary | ICD-10-CM | POA: Diagnosis not present

## 2014-11-13 DIAGNOSIS — I10 Essential (primary) hypertension: Secondary | ICD-10-CM

## 2014-11-13 DIAGNOSIS — Z7982 Long term (current) use of aspirin: Secondary | ICD-10-CM | POA: Insufficient documentation

## 2014-11-13 DIAGNOSIS — I252 Old myocardial infarction: Secondary | ICD-10-CM | POA: Insufficient documentation

## 2014-11-13 DIAGNOSIS — J449 Chronic obstructive pulmonary disease, unspecified: Secondary | ICD-10-CM | POA: Diagnosis not present

## 2014-11-13 DIAGNOSIS — E109 Type 1 diabetes mellitus without complications: Secondary | ICD-10-CM | POA: Insufficient documentation

## 2014-11-13 DIAGNOSIS — Z87891 Personal history of nicotine dependence: Secondary | ICD-10-CM | POA: Diagnosis not present

## 2014-11-13 DIAGNOSIS — E1065 Type 1 diabetes mellitus with hyperglycemia: Secondary | ICD-10-CM | POA: Diagnosis not present

## 2014-11-13 DIAGNOSIS — Z79899 Other long term (current) drug therapy: Secondary | ICD-10-CM | POA: Diagnosis not present

## 2014-11-13 DIAGNOSIS — E669 Obesity, unspecified: Secondary | ICD-10-CM | POA: Diagnosis not present

## 2014-11-13 DIAGNOSIS — R739 Hyperglycemia, unspecified: Secondary | ICD-10-CM | POA: Diagnosis not present

## 2014-11-13 DIAGNOSIS — Z9861 Coronary angioplasty status: Secondary | ICD-10-CM | POA: Diagnosis not present

## 2014-11-13 DIAGNOSIS — E1165 Type 2 diabetes mellitus with hyperglycemia: Secondary | ICD-10-CM

## 2014-11-13 DIAGNOSIS — M1990 Unspecified osteoarthritis, unspecified site: Secondary | ICD-10-CM | POA: Insufficient documentation

## 2014-11-13 DIAGNOSIS — R05 Cough: Secondary | ICD-10-CM | POA: Diagnosis not present

## 2014-11-13 DIAGNOSIS — Z8701 Personal history of pneumonia (recurrent): Secondary | ICD-10-CM | POA: Diagnosis not present

## 2014-11-13 DIAGNOSIS — Z9889 Other specified postprocedural states: Secondary | ICD-10-CM | POA: Diagnosis not present

## 2014-11-13 DIAGNOSIS — IMO0001 Reserved for inherently not codable concepts without codable children: Secondary | ICD-10-CM

## 2014-11-13 LAB — POCT URINALYSIS DIPSTICK
Bilirubin, UA: NEGATIVE
Blood, UA: NEGATIVE
Glucose, UA: 500
KETONES UA: NEGATIVE
Nitrite, UA: NEGATIVE
PH UA: 5
PROTEIN UA: NEGATIVE
SPEC GRAV UA: 1.015
Urobilinogen, UA: 0.2

## 2014-11-13 LAB — GLUCOSE, POCT (MANUAL RESULT ENTRY): POC GLUCOSE: 329 mg/dL — AB (ref 70–99)

## 2014-11-13 MED ORDER — PREDNISONE 50 MG PO TABS: ORAL_TABLET | ORAL | Status: DC

## 2014-11-13 MED ORDER — PROMETHAZINE-DM 6.25-15 MG/5ML PO SYRP: 5.0000 mL | ORAL_SOLUTION | Freq: Every evening | ORAL | Status: DC | PRN

## 2014-11-13 MED ORDER — INSULIN ASPART 100 UNIT/ML ~~LOC~~ SOLN
5.0000 [IU] | Freq: Once | SUBCUTANEOUS | Status: AC
  Administered 2014-11-13: 5 [IU] via SUBCUTANEOUS

## 2014-11-13 MED ORDER — PREDNISONE 5 MG (21) PO TBPK: ORAL_TABLET | ORAL | Status: DC

## 2014-11-13 MED ORDER — PREDNISONE 50 MG PO TABS
60.0000 mg | ORAL_TABLET | Freq: Once | ORAL | Status: AC
  Administered 2014-11-13: 60 mg via ORAL
  Filled 2014-11-13 (×2): qty 1

## 2014-11-13 MED ORDER — HYDROCOD POLST-CPM POLST ER 10-8 MG/5ML PO SUER: 5.0000 mL | Freq: Two times a day (BID) | ORAL | Status: DC | PRN

## 2014-11-13 MED ORDER — ALBUTEROL SULFATE (2.5 MG/3ML) 0.083% IN NEBU
2.5000 mg | INHALATION_SOLUTION | Freq: Once | RESPIRATORY_TRACT | Status: AC
  Administered 2014-11-13: 2.5 mg via RESPIRATORY_TRACT
  Filled 2014-11-13: qty 3

## 2014-11-13 MED ORDER — AZITHROMYCIN 250 MG PO TABS: ORAL_TABLET | ORAL | Status: DC

## 2014-11-13 MED ORDER — HYDROCOD POLST-CPM POLST ER 10-8 MG/5ML PO SUER
5.0000 mL | Freq: Once | ORAL | Status: AC
  Administered 2014-11-13: 5 mL via ORAL
  Filled 2014-11-13: qty 5

## 2014-11-13 MED ORDER — IPRATROPIUM-ALBUTEROL 0.5-2.5 (3) MG/3ML IN SOLN
3.0000 mL | Freq: Once | RESPIRATORY_TRACT | Status: AC
  Administered 2014-11-13: 3 mL via RESPIRATORY_TRACT
  Filled 2014-11-13: qty 3

## 2014-11-13 MED ORDER — ALBUTEROL SULFATE HFA 108 (90 BASE) MCG/ACT IN AERS: 1.0000 | INHALATION_SPRAY | Freq: Four times a day (QID) | RESPIRATORY_TRACT | Status: DC | PRN

## 2014-11-13 MED ORDER — AZITHROMYCIN 250 MG PO TABS
ORAL_TABLET | ORAL | Status: DC
Start: 2014-11-13 — End: 2014-11-13

## 2014-11-13 MED ORDER — METHYLPREDNISOLONE ACETATE 80 MG/ML IJ SUSP
80.0000 mg | Freq: Once | INTRAMUSCULAR | Status: AC
  Administered 2014-11-13: 80 mg via INTRAMUSCULAR

## 2014-11-13 NOTE — ED Provider Notes (Signed)
CSN: [id number]     Arrival date & time 11/13/14  1702 History   First MD Initiated Contact with Patient 11/13/14 2007     Chief Complaint  Patient presents with  . Cough     (Consider location/radiation/quality/duration/timing/severity/associated sxs/prior Treatment) HPI..... Cough for 4 days without fever, chills, rusty sputum, chest pain. Patient has COPD, but  no longer smokes. He uses oxygen at night. He is ambulatory.  Past Medical History  Diagnosis Date  . GERD (gastroesophageal reflux disease)   . CAD (coronary artery disease)   . Obesity   . Hyperlipidemia   . Hypertension   . Chronic back pain   . Gout   . Chronic bronchitis     "get it q yr"  . Psoriasis   . COPD (chronic obstructive pulmonary disease)   . Myocardial infarction 1999  . Pneumonia     "a few times; last time was ~ 06/2013"  . On home oxygen therapy     "2L; only at night" (08/30/2013)  . Diabetes mellitus, type 1   . Arthritis of knee   . Arthritis     "left leg" (08/30/2013)   Past Surgical History  Procedure Laterality Date  . Appendectomy    . Colonoscopy  03/17/2012    Procedure: COLONOSCOPY;  Surgeon: Rogene Houston, MD;  Location: [facility] SUITE;  Service: Endoscopy;  Laterality: N/A;  830  . Cystoscopy with urethral dilatation N/A 11/26/2012    Procedure: CYSTOSCOPY WITH URETHRAL DILATATION;  Surgeon: Malka So, MD;  Location: [facility] ORS;  Service: Urology;  Laterality: N/A;  . Coronary angioplasty with stent placement  1999- 2009-08/30/2013    "counting today's, I have 5 stents" (08/30/2013)  . Left heart catheterization with coronary angiogram N/A 08/30/2013    Procedure: LEFT HEART CATHETERIZATION WITH CORONARY ANGIOGRAM;  Surgeon: Clent Demark, MD;  Location: [facility] CATH LAB;  Service: Cardiovascular;  Laterality: N/A;  . Percutaneous coronary stent intervention (pci-s) Right 08/30/2013    Procedure: PERCUTANEOUS CORONARY STENT INTERVENTION (PCI-S);  Surgeon: Clent Demark, MD;  Location:  [facility] CATH LAB;  Service: Cardiovascular;  Laterality: Right;   Family History  Problem Relation Age of Onset  . Hypertension Mother   . Diabetes Mother   . Hypertension Father   . Diabetes Brother   . Arthritis      Family History   . Diabetes      family History    History  Substance Use Topics  . Smoking status: Former Smoker -- 0.50 packs/day for 33 years    Types: Cigarettes  . Smokeless tobacco: Never Used     Comment: "stopped smoking in the 1990's  . Alcohol Use: No    Review of Systems  All other systems reviewed and are negative.     Allergies  Review of patient's allergies indicates no known allergies.  Home Medications   Prior to Admission medications   Medication Sig Start Date End Date Taking? Authorizing Provider  allopurinol (ZYLOPRIM) 300 MG tablet Take 300 mg by mouth daily. 05/02/13  Yes Historical Provider, MD  aspirin 325 MG tablet Take 325 mg by mouth daily.   Yes Historical Provider, MD  insulin NPH-regular Human (NOVOLIN 70/30) (70-30) 100 UNIT/ML injection Inject 45 Units into the skin 2 (two) times daily with a meal. 07/25/13  Yes Fayrene Helper, MD  levothyroxine (SYNTHROID, LEVOTHROID) 75 MCG tablet Take 75 mcg by mouth every morning.    Yes Historical Provider, MD  metFORMIN (GLUCOPHAGE)  500 MG tablet Take 1 tablet (500 mg total) by mouth 2 (two) times daily. 09/02/13  Yes Charolette Forward, MD  metoprolol (LOPRESSOR) 100 MG tablet Take 0.5 tablets (50 mg total) by mouth 2 (two) times daily. 08/31/13  Yes Charolette Forward, MD  olmesartan (BENICAR) 40 MG tablet Take 40 mg by mouth daily.   Yes Historical Provider, MD  pravastatin (PRAVACHOL) 40 MG tablet Take 1 tablet (40 mg total) by mouth daily. 09/20/14  Yes Fayrene Helper, MD  promethazine-dextromethorphan (PROMETHAZINE-DM) 6.25-15 MG/5ML syrup Take 5 mLs by mouth at bedtime as needed for cough. 11/13/14  Yes Fayrene Helper, MD  Ticagrelor (BRILINTA) 90 MG TABS tablet Take 1 tablet (90 mg total)  by mouth 2 (two) times daily. 08/31/13  Yes Charolette Forward, MD  albuterol (PROVENTIL HFA;VENTOLIN HFA) 108 (90 BASE) MCG/ACT inhaler Inhale 1-2 puffs into the lungs every 6 (six) hours as needed for wheezing or shortness of breath. 11/13/14   Nat Christen, MD  amLODipine (NORVASC) 10 MG tablet Take 1 tablet (10 mg total) by mouth daily. Patient not taking: Reported on 11/13/2014 09/20/14   Fayrene Helper, MD  azithromycin [facility]) 250 MG tablet Take as directed on package Patient not taking: Reported on 11/13/2014 11/13/14   Fayrene Helper, MD  chlorpheniramine-HYDROcodone [facility] PENNKINETIC ER) 10-8 MG/5ML SUER Take 5 mLs by mouth every 12 (twelve) hours as needed for cough. 11/13/14   Nat Christen, MD  fenofibrate micronized (LOFIBRA) 67 MG capsule Take 1 capsule (67 mg total) by mouth daily before breakfast. Patient not taking: Reported on 11/13/2014 08/31/14   Fayrene Helper, MD  lisinopril (PRINIVIL,ZESTRIL) 40 MG tablet Take 1 tablet (40 mg total) by mouth daily. Patient not taking: Reported on 11/13/2014 09/20/14   Fayrene Helper, MD  predniSONE (DELTASONE) 50 MG tablet 1 tablet for 5 days, one half tablet for 5 days 11/13/14   Nat Christen, MD   BP 126/59 mmHg  Pulse 76  Temp(Src) 97.9 F (36.6 C) (Oral)  Resp 20  Ht 5\' 10"  (1.778 m)  Wt 230 lb (104.327 kg)  BMI 33.00 kg/m2  SpO2 92% Physical Exam  Constitutional: He is oriented to person, place, and time.  Does not appear toxic.  HENT:  Head: Normocephalic and atraumatic.  Eyes: Conjunctivae and EOM are normal. Pupils are equal, round, and reactive to light.  Neck: Normal range of motion. Neck supple.  Cardiovascular: Normal rate and regular rhythm.   Pulmonary/Chest: Effort normal and breath sounds normal.  Abdominal: Soft. Bowel sounds are normal.  Musculoskeletal: Normal range of motion.  Neurological: He is alert and oriented to person, place, and time.  Skin: Skin is warm and dry.  Psychiatric: He has a normal mood  and affect. His behavior is normal.  Nursing note and vitals reviewed.   ED Course  Procedures (including critical care time) Labs Review Labs Reviewed - No data to display  Imaging Review Dg Chest 2 View  11/13/2014   CLINICAL DATA:  Nonproductive cough for 4 days.  EXAM: CHEST  2 VIEW  COMPARISON:  07/25/2013  FINDINGS: The lungs are hyperinflated with emphysema, bilateral upper lobe scarring, and scarring in the lingula.The cardiomediastinal contours are normal. Pulmonary vasculature is normal. No consolidation, pleural effusion, or pneumothorax. No acute osseous abnormalities are seen.  IMPRESSION: Chronic emphysema and scarring without acute pulmonary process.   Electronically Signed   By: Jeb Levering M.D.   On: 11/13/2014 20:35     EKG Interpretation None  MDM   Final diagnoses:  COPD exacerbation   Patient feels much better after albuterol/Atrovent nebulizer treatment. Chest x-ray shows no pneumonia. Discharge medications prednisone, albuterol inhaler, Tussionex cough syrup.    Nat Christen, MD 11/13/14 2140

## 2014-11-13 NOTE — Patient Instructions (Addendum)
Annual wellness in 5 weeks, call if you need me before  You are treated for flare of COPD today  CXR and labs today  Medications sent to your pharnmacy

## 2014-11-13 NOTE — Discharge Instructions (Signed)
Chest x-ray showed no pneumonia. Prescription for cough syrup, inhaler, prednisone. Follow-up your Dr.

## 2014-11-13 NOTE — ED Notes (Addendum)
Non-productive cough x 4 days.  Denies fever, chills, chest pain.  No known sick contacts.

## 2014-11-14 ENCOUNTER — Telehealth: Payer: Self-pay | Admitting: Family Medicine

## 2014-11-14 ENCOUNTER — Emergency Department (HOSPITAL_COMMUNITY)
Admission: EM | Admit: 2014-11-14 | Discharge: 2014-11-15 | Disposition: A | Discharge status: Home / Self Care | Payer: Commercial Managed Care - HMO | Attending: Emergency Medicine | Admitting: Emergency Medicine

## 2014-11-14 ENCOUNTER — Other Ambulatory Visit: Payer: Self-pay | Admitting: Family Medicine

## 2014-11-14 ENCOUNTER — Encounter (HOSPITAL_COMMUNITY): Payer: Self-pay | Admitting: *Deleted

## 2014-11-14 ENCOUNTER — Ambulatory Visit: Payer: Commercial Managed Care - HMO | Admitting: Family Medicine

## 2014-11-14 DIAGNOSIS — Z9861 Coronary angioplasty status: Secondary | ICD-10-CM | POA: Insufficient documentation

## 2014-11-14 DIAGNOSIS — Z87891 Personal history of nicotine dependence: Secondary | ICD-10-CM | POA: Diagnosis not present

## 2014-11-14 DIAGNOSIS — E1065 Type 1 diabetes mellitus with hyperglycemia: Secondary | ICD-10-CM | POA: Insufficient documentation

## 2014-11-14 DIAGNOSIS — Z9981 Dependence on supplemental oxygen: Secondary | ICD-10-CM | POA: Insufficient documentation

## 2014-11-14 DIAGNOSIS — M109 Gout, unspecified: Secondary | ICD-10-CM | POA: Diagnosis not present

## 2014-11-14 DIAGNOSIS — I252 Old myocardial infarction: Secondary | ICD-10-CM | POA: Insufficient documentation

## 2014-11-14 DIAGNOSIS — M1710 Unilateral primary osteoarthritis, unspecified knee: Secondary | ICD-10-CM | POA: Diagnosis not present

## 2014-11-14 DIAGNOSIS — R739 Hyperglycemia, unspecified: Secondary | ICD-10-CM

## 2014-11-14 DIAGNOSIS — G8929 Other chronic pain: Secondary | ICD-10-CM | POA: Diagnosis not present

## 2014-11-14 DIAGNOSIS — Z79899 Other long term (current) drug therapy: Secondary | ICD-10-CM | POA: Diagnosis not present

## 2014-11-14 DIAGNOSIS — Z7982 Long term (current) use of aspirin: Secondary | ICD-10-CM | POA: Diagnosis not present

## 2014-11-14 DIAGNOSIS — Z9889 Other specified postprocedural states: Secondary | ICD-10-CM | POA: Diagnosis not present

## 2014-11-14 DIAGNOSIS — I10 Essential (primary) hypertension: Secondary | ICD-10-CM | POA: Insufficient documentation

## 2014-11-14 DIAGNOSIS — J449 Chronic obstructive pulmonary disease, unspecified: Secondary | ICD-10-CM | POA: Insufficient documentation

## 2014-11-14 DIAGNOSIS — I2510 Atherosclerotic heart disease of native coronary artery without angina pectoris: Secondary | ICD-10-CM | POA: Insufficient documentation

## 2014-11-14 DIAGNOSIS — Z872 Personal history of diseases of the skin and subcutaneous tissue: Secondary | ICD-10-CM | POA: Diagnosis not present

## 2014-11-14 DIAGNOSIS — Z8719 Personal history of other diseases of the digestive system: Secondary | ICD-10-CM | POA: Insufficient documentation

## 2014-11-14 DIAGNOSIS — E785 Hyperlipidemia, unspecified: Secondary | ICD-10-CM | POA: Diagnosis not present

## 2014-11-14 DIAGNOSIS — Z794 Long term (current) use of insulin: Secondary | ICD-10-CM | POA: Diagnosis not present

## 2014-11-14 DIAGNOSIS — J441 Chronic obstructive pulmonary disease with (acute) exacerbation: Secondary | ICD-10-CM

## 2014-11-14 DIAGNOSIS — Z8701 Personal history of pneumonia (recurrent): Secondary | ICD-10-CM | POA: Diagnosis not present

## 2014-11-14 DIAGNOSIS — E669 Obesity, unspecified: Secondary | ICD-10-CM | POA: Diagnosis not present

## 2014-11-14 LAB — CBC WITH DIFFERENTIAL/PLATELET
Basophils Absolute: 0 10*3/uL (ref 0.0–0.1)
Basophils Absolute: 0.1 10*3/uL (ref 0.0–0.1)
Basophils Relative: 0 % (ref 0–1)
Basophils Relative: 0 % (ref 0–1)
EOS ABS: 0 10*3/uL (ref 0.0–0.7)
Eosinophils Absolute: 0 10*3/uL (ref 0.0–0.7)
Eosinophils Relative: 0 % (ref 0–5)
Eosinophils Relative: 0 % (ref 0–5)
HCT: 30.6 % — ABNORMAL LOW (ref 39.0–52.0)
HEMATOCRIT: 33.7 % — AB (ref 39.0–52.0)
Hemoglobin: 10.9 g/dL — ABNORMAL LOW (ref 13.0–17.0)
Hemoglobin: 10.2 g/dL — ABNORMAL LOW (ref 13.0–17.0)
LYMPHS ABS: 1.2 10*3/uL (ref 0.7–4.0)
Lymphocytes Relative: 10 % — ABNORMAL LOW (ref 12–46)
Lymphocytes Relative: 19 % (ref 12–46)
Lymphs Abs: 2.8 10*3/uL (ref 0.7–4.0)
MCH: 27.1 pg (ref 26.0–34.0)
MCH: 27.3 pg (ref 26.0–34.0)
MCHC: 32.3 g/dL (ref 30.0–36.0)
MCHC: 33.3 g/dL (ref 30.0–36.0)
MCV: 83.8 fL (ref 78.0–100.0)
MCV: 82 fL (ref 78.0–100.0)
MONO ABS: 0.1 10*3/uL (ref 0.1–1.0)
MPV: 9.8 fL (ref 8.6–12.4)
Monocytes Absolute: 0.9 10*3/uL (ref 0.1–1.0)
Monocytes Relative: 1 % — ABNORMAL LOW (ref 3–12)
Monocytes Relative: 6 % (ref 3–12)
Neutro Abs: 11 10*3/uL — ABNORMAL HIGH (ref 1.7–7.7)
Neutro Abs: 10.8 10*3/uL — ABNORMAL HIGH (ref 1.7–7.7)
Neutrophils Relative %: 89 % — ABNORMAL HIGH (ref 43–77)
Neutrophils Relative %: 74 % (ref 43–77)
Platelets: 330 10*3/uL (ref 150–400)
Platelets: 290 10*3/uL (ref 150–400)
RBC: 4.02 MIL/uL — ABNORMAL LOW (ref 4.22–5.81)
RBC: 3.73 MIL/uL — ABNORMAL LOW (ref 4.22–5.81)
RDW: 14.7 % (ref 11.5–15.5)
RDW: 14.1 % (ref 11.5–15.5)
WBC: 12.4 10*3/uL — ABNORMAL HIGH (ref 4.0–10.5)
WBC: 14.6 10*3/uL — ABNORMAL HIGH (ref 4.0–10.5)

## 2014-11-14 LAB — COMPREHENSIVE METABOLIC PANEL
ALT: 9 U/L — ABNORMAL LOW (ref 17–63)
AST: 13 U/L — ABNORMAL LOW (ref 15–41)
Albumin: 3.3 g/dL — ABNORMAL LOW (ref 3.5–5.0)
Alkaline Phosphatase: 100 U/L (ref 38–126)
Anion gap: 9 (ref 5–15)
BUN: 31 mg/dL — ABNORMAL HIGH (ref 6–20)
CO2: 22 mmol/L (ref 22–32)
Calcium: 8.7 mg/dL — ABNORMAL LOW (ref 8.9–10.3)
Chloride: 95 mmol/L — ABNORMAL LOW (ref 101–111)
Creatinine, Ser: 1.46 mg/dL — ABNORMAL HIGH (ref 0.61–1.24)
GFR calc Af Amer: 54 mL/min — ABNORMAL LOW (ref >60)
GFR calc non Af Amer: 46 mL/min — ABNORMAL LOW (ref >60)
Glucose, Bld: 511 mg/dL — ABNORMAL HIGH (ref 65–99)
Potassium: 4.4 mmol/L (ref 3.5–5.1)
Sodium: 126 mmol/L — ABNORMAL LOW (ref 135–145)
Total Bilirubin: 0.3 mg/dL (ref 0.3–1.2)
Total Protein: 7.3 g/dL (ref 6.5–8.1)

## 2014-11-14 LAB — BASIC METABOLIC PANEL
BUN: 33 mg/dL — ABNORMAL HIGH (ref 6–23)
CHLORIDE: 94 meq/L — AB (ref 96–112)
CO2: 17 meq/L — AB (ref 19–32)
CREATININE: 1.48 mg/dL — AB (ref 0.50–1.35)
Calcium: 8.9 mg/dL (ref 8.4–10.5)
Glucose, Bld: 539 mg/dL (ref 70–99)
Potassium: 5.1 mEq/L (ref 3.5–5.3)
SODIUM: 129 meq/L — AB (ref 135–145)

## 2014-11-14 LAB — CBG MONITORING, ED: GLUCOSE-CAPILLARY: 504 mg/dL — AB (ref 65–99)

## 2014-11-14 MED ORDER — SODIUM CHLORIDE 0.9 % IV BOLUS (SEPSIS)
1000.0000 mL | Freq: Once | INTRAVENOUS | Status: AC
  Administered 2014-11-15: 1000 mL via INTRAVENOUS

## 2014-11-14 MED ORDER — SODIUM CHLORIDE 0.9 % IV BOLUS (SEPSIS)
1000.0000 mL | Freq: Once | INTRAVENOUS | Status: AC
  Administered 2014-11-14: 1000 mL via INTRAVENOUS

## 2014-11-14 MED ORDER — INSULIN ASPART 100 UNIT/ML ~~LOC~~ SOLN
16.0000 [IU] | Freq: Once | SUBCUTANEOUS | Status: AC
  Administered 2014-11-14: 16 [IU] via INTRAVENOUS
  Filled 2014-11-14: qty 1

## 2014-11-14 NOTE — ED Notes (Addendum)
Pt states he has had high blood sugar for a couple of days.

## 2014-11-14 NOTE — Telephone Encounter (Signed)
Pt went to Ed yesterday, and got neb treatment, states this helped States his breathing machine does not work. Need to  See Dr Luan Pulling asap to get his equipment and COPD management controlled, pt told me in the hallway that his neb machine does not work and he does not have the necessary meds

## 2014-11-14 NOTE — Telephone Encounter (Signed)
Patient has an appointment with Dr Luan Pulling Thursday at 11:30 patient is aware of this appointment

## 2014-11-14 NOTE — ED Notes (Signed)
Pt states he has not been taking insulin for the past 3 months. Pt told by PCP to come to the ER after given blood today.

## 2014-11-15 ENCOUNTER — Other Ambulatory Visit: Payer: Self-pay | Admitting: Family Medicine

## 2014-11-15 DIAGNOSIS — J449 Chronic obstructive pulmonary disease, unspecified: Secondary | ICD-10-CM | POA: Diagnosis not present

## 2014-11-15 DIAGNOSIS — G8929 Other chronic pain: Secondary | ICD-10-CM | POA: Diagnosis not present

## 2014-11-15 DIAGNOSIS — E785 Hyperlipidemia, unspecified: Secondary | ICD-10-CM | POA: Diagnosis not present

## 2014-11-15 DIAGNOSIS — E669 Obesity, unspecified: Secondary | ICD-10-CM | POA: Diagnosis not present

## 2014-11-15 DIAGNOSIS — I252 Old myocardial infarction: Secondary | ICD-10-CM | POA: Diagnosis not present

## 2014-11-15 DIAGNOSIS — M109 Gout, unspecified: Secondary | ICD-10-CM | POA: Diagnosis not present

## 2014-11-15 DIAGNOSIS — Z794 Long term (current) use of insulin: Principal | ICD-10-CM

## 2014-11-15 DIAGNOSIS — E1065 Type 1 diabetes mellitus with hyperglycemia: Secondary | ICD-10-CM | POA: Diagnosis not present

## 2014-11-15 DIAGNOSIS — I2510 Atherosclerotic heart disease of native coronary artery without angina pectoris: Secondary | ICD-10-CM | POA: Diagnosis not present

## 2014-11-15 DIAGNOSIS — E1165 Type 2 diabetes mellitus with hyperglycemia: Principal | ICD-10-CM

## 2014-11-15 DIAGNOSIS — I10 Essential (primary) hypertension: Secondary | ICD-10-CM | POA: Diagnosis not present

## 2014-11-15 DIAGNOSIS — IMO0001 Reserved for inherently not codable concepts without codable children: Secondary | ICD-10-CM

## 2014-11-15 LAB — CBG MONITORING, ED: Glucose-Capillary: 244 mg/dL — ABNORMAL HIGH (ref 65–99)

## 2014-11-15 LAB — HEMOGLOBIN A1C
Hgb A1c MFr Bld: 12.3 % — ABNORMAL HIGH (ref <5.7)
Mean Plasma Glucose: 306 mg/dL — ABNORMAL HIGH (ref <117)

## 2014-11-15 LAB — BRAIN NATRIURETIC PEPTIDE: BRAIN NATRIURETIC PEPTIDE: 23.3 pg/mL (ref 0.0–100.0)

## 2014-11-15 MED ORDER — INSULIN NPH ISOPHANE & REGULAR (70-30) 100 UNIT/ML ~~LOC~~ SUSP: 45.0000 [IU] | Freq: Two times a day (BID) | SUBCUTANEOUS | Status: DC

## 2014-11-15 MED ORDER — BLOOD GLUCOSE MONITOR KIT: PACK | Status: DC

## 2014-11-15 NOTE — ED Notes (Signed)
Pt alert & oriented x4, stable gait. Patient given discharge instructions, paperwork & prescription(s). Patient  instructed to stop at the registration desk to finish any additional paperwork. Patient verbalized understanding. Pt left department w/ no further questions. 

## 2014-11-15 NOTE — Discharge Instructions (Signed)

## 2014-11-15 NOTE — ED Provider Notes (Signed)
CSN: [id number]     Arrival date & time 11/14/14  2211 History  This chart was scribed for Virgel Manifold, MD by Sima Matas, ED Scribe. This patient was seen in room APA17/APA17 and the patient's care was started at 12:06 AM.    Chief Complaint  Patient presents with  . Hyperglycemia    The history is provided by the patient. No language interpreter was used.    HPI Comments: [name] is a 74 y.o. male with a PMHx of DM, who presents to the Emergency Department complaining of hyperglycemia (Current CBG is 504). Patient is prescribed 45 units insulin 2x a day and states he has not taken insulin for the past 3 months. He was told to come to the ED after blood test earlier today. Patient reports his blood sugar ranges from 100-110 with insulin. Patient was also prescribed steroids 3 days ago but has not taken any. Patient denies thirst, frequency, fatigue and nausea. Patient states he is otherwise feeling well.   Past Medical History  Diagnosis Date  . GERD (gastroesophageal reflux disease)   . CAD (coronary artery disease)   . Obesity   . Hyperlipidemia   . Hypertension   . Chronic back pain   . Gout   . Chronic bronchitis     "get it q yr"  . Psoriasis   . COPD (chronic obstructive pulmonary disease)   . Myocardial infarction 1999  . Pneumonia     "a few times; last time was ~ 06/2013"  . On home oxygen therapy     "2L; only at night" (08/30/2013)  . Diabetes mellitus, type 1   . Arthritis of knee   . Arthritis     "left leg" (08/30/2013)   Past Surgical History  Procedure Laterality Date  . Appendectomy    . Colonoscopy  03/17/2012    Procedure: COLONOSCOPY;  Surgeon: Rogene Houston, MD;  Location: [facility];  Service: Endoscopy;  Laterality: N/A;  830  . Cystoscopy with urethral dilatation N/A 11/26/2012    Procedure: CYSTOSCOPY WITH URETHRAL DILATATION;  Surgeon: Malka So, MD;  Location: [facility] ORS;  Service: Urology;  Laterality: N/A;  . Coronary angioplasty  with stent placement  1999- 2009-08/30/2013    "counting today's, I have 5 stents" (08/30/2013)  . Left heart catheterization with coronary angiogram N/A 08/30/2013    Procedure: LEFT HEART CATHETERIZATION WITH CORONARY ANGIOGRAM;  Surgeon: Clent Demark, MD;  Location: [facility] CATH LAB;  Service: Cardiovascular;  Laterality: N/A;  . Percutaneous coronary stent intervention (pci-s) Right 08/30/2013    Procedure: PERCUTANEOUS CORONARY STENT INTERVENTION (PCI-S);  Surgeon: Clent Demark, MD;  Location: [facility] CATH LAB;  Service: Cardiovascular;  Laterality: Right;   Family History  Problem Relation Age of Onset  . Hypertension Mother   . Diabetes Mother   . Hypertension Father   . Diabetes Brother   . Arthritis      Family History   . Diabetes      family History    History  Substance Use Topics  . Smoking status: Former Smoker -- 0.50 packs/day for 33 years    Types: Cigarettes  . Smokeless tobacco: Never Used     Comment: "stopped smoking in the 1990's  . Alcohol Use: No    Review of Systems  Constitutional: Negative for fatigue.  Gastrointestinal: Negative for nausea.  Genitourinary: Negative for frequency.  All other systems reviewed and are negative.    Allergies  Review of  patient's allergies indicates no known allergies.  Home Medications   Prior to Admission medications   Medication Sig Start Date End Date Taking? Authorizing Provider  albuterol (PROVENTIL HFA;VENTOLIN HFA) 108 (90 BASE) MCG/ACT inhaler Inhale 1-2 puffs into the lungs every 6 (six) hours as needed for wheezing or shortness of breath. 11/13/14   Nat Christen, MD  allopurinol (ZYLOPRIM) 300 MG tablet Take 300 mg by mouth daily. 05/02/13   Historical Provider, MD  amLODipine (NORVASC) 10 MG tablet Take 1 tablet (10 mg total) by mouth daily. Patient not taking: Reported on 11/13/2014 09/20/14   Fayrene Helper, MD  aspirin 325 MG tablet Take 325 mg by mouth daily.    Historical Provider, MD  azithromycin  (ZITHROMAX) 250 MG tablet Take as directed on package Patient not taking: Reported on 11/13/2014 11/13/14   Fayrene Helper, MD  chlorpheniramine-HYDROcodone [facility] PENNKINETIC ER) 10-8 MG/5ML SUER Take 5 mLs by mouth every 12 (twelve) hours as needed for cough. 11/13/14   Nat Christen, MD  fenofibrate micronized (LOFIBRA) 67 MG capsule Take 1 capsule (67 mg total) by mouth daily before breakfast. Patient not taking: Reported on 11/13/2014 08/31/14   Fayrene Helper, MD  insulin NPH-regular Human (NOVOLIN 70/30) (70-30) 100 UNIT/ML injection Inject 45 Units into the skin 2 (two) times daily with a meal. 07/25/13   Fayrene Helper, MD  levothyroxine (SYNTHROID, LEVOTHROID) 75 MCG tablet Take 75 mcg by mouth every morning.     Historical Provider, MD  lisinopril (PRINIVIL,ZESTRIL) 40 MG tablet Take 1 tablet (40 mg total) by mouth daily. Patient not taking: Reported on 11/13/2014 09/20/14   Fayrene Helper, MD  metFORMIN (GLUCOPHAGE) 500 MG tablet Take 1 tablet (500 mg total) by mouth 2 (two) times daily. 09/02/13   Charolette Forward, MD  metoprolol (LOPRESSOR) 100 MG tablet Take 0.5 tablets (50 mg total) by mouth 2 (two) times daily. 08/31/13   Charolette Forward, MD  olmesartan (BENICAR) 40 MG tablet Take 40 mg by mouth daily.    Historical Provider, MD  pravastatin (PRAVACHOL) 40 MG tablet Take 1 tablet (40 mg total) by mouth daily. 09/20/14   Fayrene Helper, MD  predniSONE (DELTASONE) 50 MG tablet 1 tablet for 5 days, one half tablet for 5 days 11/13/14   Nat Christen, MD  promethazine-dextromethorphan (PROMETHAZINE-DM) 6.25-15 MG/5ML syrup Take 5 mLs by mouth at bedtime as needed for cough. 11/13/14   Fayrene Helper, MD  Ticagrelor (BRILINTA) 90 MG TABS tablet Take 1 tablet (90 mg total) by mouth 2 (two) times daily. 08/31/13   Charolette Forward, MD   Triage VItals: BP 123/61 mmHg  Pulse 73  Temp(Src) 98.1 F (36.7 C) (Oral)  Resp 19  Ht 5\' 10"  (1.778 m)  Wt 230 lb (104.327 kg)  BMI 33.00 kg/m2   SpO2 94% Physical Exam  Constitutional: He is oriented to person, place, and time. He appears well-developed and well-nourished. No distress.  HENT:  Head: Normocephalic and atraumatic.  Eyes: Conjunctivae and EOM are normal.  Neck: Neck supple. No tracheal deviation present.  Cardiovascular: Normal rate and regular rhythm.   No murmur heard. Pulmonary/Chest: Effort normal and breath sounds normal. No respiratory distress.  Musculoskeletal: Normal range of motion.  Neurological: He is alert and oriented to person, place, and time.  Skin: Skin is warm and dry.  Psychiatric: He has a normal mood and affect. His behavior is normal.  Nursing note and vitals reviewed.   ED Course  Procedures  DIAGNOSTIC  STUDIES: Oxygen Saturation is 94% on room air, adequate by my interpretation.    COORDINATION OF CARE: 12:09 AM- Advised patient not to fill prescription for steroids. Discussed plan for diagnostic lab work. Pt advised of plan for treatment, which includes prescription for insulin and pt agrees.  Labs Review Labs Reviewed  CBC WITH DIFFERENTIAL/PLATELET - Abnormal; Notable for the following:    WBC 14.6 (*)    RBC 3.73 (*)    Hemoglobin 10.2 (*)    HCT 30.6 (*)    Neutro Abs 10.8 (*)    All other components within normal limits  COMPREHENSIVE METABOLIC PANEL - Abnormal; Notable for the following:    Sodium 126 (*)    Chloride 95 (*)    Glucose, Bld 511 (*)    BUN 31 (*)    Creatinine, Ser 1.46 (*)    Calcium 8.7 (*)    Albumin 3.3 (*)    AST 13 (*)    ALT 9 (*)    GFR calc non Af Amer 46 (*)    GFR calc Af Amer 54 (*)    All other components within normal limits  CBG MONITORING, ED - Abnormal; Notable for the following:    Glucose-Capillary 504 (*)    All other components within normal limits    Imaging Review Dg Chest 2 View  11/13/2014   CLINICAL DATA:  Nonproductive cough for 4 days.  EXAM: CHEST  2 VIEW  COMPARISON:  07/25/2013  FINDINGS: The lungs are  hyperinflated with emphysema, bilateral upper lobe scarring, and scarring in the lingula.The cardiomediastinal contours are normal. Pulmonary vasculature is normal. No consolidation, pleural effusion, or pneumothorax. No acute osseous abnormalities are seen.  IMPRESSION: Chronic emphysema and scarring without acute pulmonary process.   Electronically Signed   By: Jeb Levering M.D.   On: 11/13/2014 20:35     EKG Interpretation None      MDM   Final diagnoses:  Hyperglycemia    74 year old male with hyperglycemia. It sounds like this is incidentally noted on blood work. Patient actually has little in terms of any acute complaints. Hyperglycemia likely secondary to noncompliance. Patient was previously on insulin 70/30. He has not been taking this for the past several months though. Says he simply ran out. Patient does not appear ill. No metabolic acidosis. No anion gap. Blood sugar was improved with IV fluids and some insulin. Recently prescribed steroids, but states he has not started this yet. He denies any shortness of breath to me. I do not appreciate any adventitious breath sounds. Advised him to not take the steroids at this point as I do not feel he would get great benefit from them and they would only further exacerbate his hyperglycemia. Provided prescription for insulin the previous dosing. Discussed the need to follow-up with PCP.   I personally preformed the services scribed in my presence. The recorded information has been reviewed is accurate. Virgel Manifold, MD.     Virgel Manifold, MD 11/15/14 [phone]

## 2014-11-16 ENCOUNTER — Encounter: Payer: Self-pay | Admitting: *Deleted

## 2014-11-16 ENCOUNTER — Other Ambulatory Visit: Payer: Self-pay | Admitting: *Deleted

## 2014-11-16 LAB — URINE CULTURE: Colony Count: 6000
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
## 2014-11-16 NOTE — Patient Outreach (Signed)
Call to patient for initial referral outreach. Spoke with patient, he reports he had an MD appointment this am, but it was canceled as there is no Power at the doctor office and they hope to reschedule for tomorrow. Patient agrees to initial visit from [place] today. Plan to visit patient in his home later this morning for initial assessment. Royetta Crochet. Laymond Purser, RN, BSN, [place] [phone]

## 2014-11-22 DIAGNOSIS — E6609 Other obesity due to excess calories: Secondary | ICD-10-CM | POA: Diagnosis not present

## 2014-11-22 DIAGNOSIS — E039 Hypothyroidism, unspecified: Secondary | ICD-10-CM | POA: Diagnosis not present

## 2014-11-22 DIAGNOSIS — I10 Essential (primary) hypertension: Secondary | ICD-10-CM | POA: Diagnosis not present

## 2014-11-22 DIAGNOSIS — E1122 Type 2 diabetes mellitus with diabetic chronic kidney disease: Secondary | ICD-10-CM | POA: Diagnosis not present

## 2014-11-22 DIAGNOSIS — E785 Hyperlipidemia, unspecified: Secondary | ICD-10-CM | POA: Diagnosis not present

## 2014-11-29 DIAGNOSIS — I10 Essential (primary) hypertension: Secondary | ICD-10-CM | POA: Diagnosis not present

## 2014-11-29 DIAGNOSIS — E119 Type 2 diabetes mellitus without complications: Secondary | ICD-10-CM | POA: Diagnosis not present

## 2014-11-29 DIAGNOSIS — I209 Angina pectoris, unspecified: Secondary | ICD-10-CM | POA: Diagnosis not present

## 2014-11-29 DIAGNOSIS — E785 Hyperlipidemia, unspecified: Secondary | ICD-10-CM | POA: Diagnosis not present

## 2014-11-29 DIAGNOSIS — I2510 Atherosclerotic heart disease of native coronary artery without angina pectoris: Secondary | ICD-10-CM | POA: Diagnosis not present

## 2014-11-29 DIAGNOSIS — I252 Old myocardial infarction: Secondary | ICD-10-CM | POA: Diagnosis not present

## 2014-11-29 DIAGNOSIS — I5030 Unspecified diastolic (congestive) heart failure: Secondary | ICD-10-CM | POA: Diagnosis not present

## 2014-11-29 DIAGNOSIS — E669 Obesity, unspecified: Secondary | ICD-10-CM | POA: Diagnosis not present

## 2014-11-30 ENCOUNTER — Other Ambulatory Visit: Payer: Self-pay | Admitting: *Deleted

## 2014-11-30 DIAGNOSIS — E1159 Type 2 diabetes mellitus with other circulatory complications: Secondary | ICD-10-CM | POA: Diagnosis not present

## 2014-11-30 DIAGNOSIS — E1122 Type 2 diabetes mellitus with diabetic chronic kidney disease: Secondary | ICD-10-CM | POA: Diagnosis not present

## 2014-11-30 DIAGNOSIS — E039 Hypothyroidism, unspecified: Secondary | ICD-10-CM | POA: Diagnosis not present

## 2014-11-30 DIAGNOSIS — I10 Essential (primary) hypertension: Secondary | ICD-10-CM | POA: Diagnosis not present

## 2014-11-30 NOTE — Patient Outreach (Signed)
[facility]) Care Management   11/30/2014  [name] 1941-03-26 [id number]  [name] is an 74 y.o. male  Subjective:   Patient reporting he is "doing good" today. Patient reporting he saw Dr. [name] this am and gave his blood sugar log. His blood sugars are much lower and the Doctor is pleased. He did increase insulin to 60 units twice a day. Patient to return in 3 months. Patient reports he is taking blood sugars regularly, he gave his chart to Dr. [name] today and plans to start at new one today.  Patient states if his sugar is 90 or below he is to hold insulin. Patient also verbalizes how to treat low blood sugar, he is keeping candy in pocket and car for low sugar symptoms.  Patient saw Cardiologist yesterday. He reports that he was given a good report, he is to return in 3 months. Patient to see Primary care in August. Patient has appointment with Dr. Luan Pulling 12/12/14  Patient reports [employer] took his oxygen tank, he states there were 2 reasons, one is contract with his insurance and the other is he did not like the sound the tank made, it kept him from sleeping.   Objective:   BP 136/64 mmHg  Pulse 72  Resp 20  Wt 229 lb (103.874 kg)  SpO2 97%  CBGS: 7 day average: 204, 14 day average: 204 30 day average: 271.   Review of Systems  HENT: Negative.   Eyes: Negative.   Respiratory: Negative.        Crackles in bases  Cardiovascular: Negative.   Gastrointestinal: Negative.   Musculoskeletal: Negative.   Skin: Negative.   Neurological: Negative.   Endo/Heme/Allergies: Negative.   Psychiatric/Behavioral: Negative.     Physical Exam  Constitutional: He is oriented to person, place, and time. He appears well-developed.  Cardiovascular: Normal rate and regular rhythm.   Respiratory: Effort normal. He has rales.  Crackles in bases  Neurological: He is alert and oriented to person, place, and time.    Current Medications:   Current Outpatient  Prescriptions  Medication Sig Dispense Refill  . albuterol (PROVENTIL HFA;VENTOLIN HFA) 108 (90 BASE) MCG/ACT inhaler Inhale 1-2 puffs into the lungs every 6 (six) hours as needed for wheezing or shortness of breath. 1 Inhaler 2  . allopurinol (ZYLOPRIM) 300 MG tablet Take 300 mg by mouth daily.    Marland Kitchen amLODipine (NORVASC) 10 MG tablet Take 1 tablet (10 mg total) by mouth daily. 90 tablet 1  . aspirin 325 MG tablet Take 325 mg by mouth daily.    . blood glucose meter kit and supplies KIT Dispense based on patient and insurance preference. Use up to four times daily as directed. (FOR ICD-9 250.00, 250.01). 1 each 0  . chlorpheniramine-HYDROcodone (TUSSIONEX PENNKINETIC ER) 10-8 MG/5ML SUER Take 5 mLs by mouth every 12 (twelve) hours as needed for cough. 120 mL 0  . insulin NPH-regular Human (NOVOLIN 70/30) (70-30) 100 UNIT/ML injection Inject 45 Units into the skin 2 (two) times daily with a meal. 10 mL 2  . levothyroxine (SYNTHROID, LEVOTHROID) 75 MCG tablet Take 75 mcg by mouth every morning.     Marland Kitchen lisinopril (PRINIVIL,ZESTRIL) 40 MG tablet Take 1 tablet (40 mg total) by mouth daily. 90 tablet 1  . metFORMIN (GLUCOPHAGE) 500 MG tablet Take 1 tablet (500 mg total) by mouth 2 (two) times daily. 60 tablet 3  . metoprolol (LOPRESSOR) 100 MG tablet Take 0.5 tablets (50 mg total)  by mouth 2 (two) times daily. 60 tablet 3  . olmesartan (BENICAR) 40 MG tablet Take 40 mg by mouth daily.    Marland Kitchen spironolactone (ALDACTONE) 25 MG tablet Take 25 mg by mouth daily.    Marland Kitchen azithromycin (ZITHROMAX) 250 MG tablet Take as directed on package (Patient not taking: Reported on 11/13/2014) 6 tablet 0  . fenofibrate micronized (LOFIBRA) 67 MG capsule Take 1 capsule (67 mg total) by mouth daily before breakfast. (Patient not taking: Reported on 11/13/2014) 30 capsule 4  . pravastatin (PRAVACHOL) 40 MG tablet Take 1 tablet (40 mg total) by mouth daily. (Patient not taking: Reported on 11/30/2014) 90 tablet 1  . predniSONE  (DELTASONE) 50 MG tablet 1 tablet for 5 days, one half tablet for 5 days (Patient not taking: Reported on 11/16/2014) 8 tablet 0  . promethazine-dextromethorphan (PROMETHAZINE-DM) 6.25-15 MG/5ML syrup Take 5 mLs by mouth at bedtime as needed for cough. (Patient not taking: Reported on 11/16/2014) 118 mL 0  . Ticagrelor (BRILINTA) 90 MG TABS tablet Take 1 tablet (90 mg total) by mouth 2 (two) times daily. (Patient not taking: Reported on 11/16/2014) 60 tablet 11  . Umeclidinium-Vilanterol (ANORO ELLIPTA) 62.5-25 MCG/INH AEPB Inhale 1 puff into the lungs every morning.     No current facility-administered medications for this visit.    Functional Status:   In your present state of health, do you have any difficulty performing the following activities: 11/16/2014  Hearing? N  Vision? N  Difficulty concentrating or making decisions? N  Walking or climbing stairs? N  Dressing or bathing? N  Doing errands, shopping? N  Preparing Food and eating ? N  Using the Toilet? N  In the past six months, have you accidently leaked urine? N  Do you have problems with loss of bowel control? N  Managing your Medications? N  Managing your Finances? N  Housekeeping or managing your Housekeeping? N    Fall/Depression Screening:    Fall Risk  11/16/2014 11/16/2014 12/08/2013 08/15/2013 11/22/2012  Falls in the past year? (No Data) No Yes No No  Number falls in past yr: 1 - 1 - -  Injury with Fall? No - Yes - -  Risk Factor Category  - - High Fall Risk - -  Follow up Falls prevention discussed;Education provided - - - -   PHQ 2/9 Scores 11/16/2014 12/08/2013 08/15/2013  PHQ - 2 Score 0 0 0    Assessment:   Patient out of anoro, call to Dr. Luan Pulling office, left Voicemail with [employer] regarding anoro samples and also that patient not using oxygen anymore-returned tank to company. See plan of care.  Plan: Visit again in August Patient to keep appointment with Dr. Luan Pulling on 7/26 and Dr. Moshe Cipro 8/8 Will forward  visit note to Dr. Roda Shutters E. Laymond Purser, RN, BSN, [place] [phone]  [facility] CM Care Plan Problem One        Patient Outreach from 11/30/2014 in [place] Problem One  Knowledge deficit related to diabetes AEB patient questions regarding hyperglycemia   Care Plan for Problem One  Active   THN Long Term Goal (31-90 days)  Patient CBG readings will show improvement over the next 90 days   THN Long Term Goal Start Date  11/16/14   Interventions for Problem One Long Term Goal  reviewed CBG readings on meter, reinforced for patient to continue to check regularly and report to MD at appoitnments  THN CM Short Term Goal #1 (0-30 days)  Patient will be compliant with taking inuslin over the next 30 days   THN CM Short Term Goal #1 Start Date  11/16/14   Interventions for Short Term Goal #1  using teachback method reviewed patient insulin regimen and reinforce improtance of mediacation adherence    THN CM Short Term Goal #2 (0-30 days)  Patient will keep MD apointment with Endocrinologist in the next 7 days   THN CM Short Term Goal #2 Start Date  11/16/14   [facility] CM Short Term Goal #2 Met Date  11/30/14 [patient kept appointment with Dr. Nida]   Interventions for Short Term Goal #2  patient kept appointment with Dr. [name], reviewed results of appointment   [facility] CM Short Term Goal #3 (0-30 days)  Patient will be aware of signs and symptoms of low and high blood sugars over the next 30 days   THN CM Short Term Goal #3 Start Date  11/16/14   Interventions for Short Tern Goal #3  Using teachback method reinforced with patient his knowledge of the usual signs of low and high blood sugars    THN CM Care Plan Problem Two        Patient Outreach from 11/30/2014 in [place] for Problem Two  Active   Interventions for Problem Two Long Term Goal   using teachback method reviewed with patient signs and symptoms of COPD to report to  MD   [facility] Long Term Goal (31-90) days  Patient will not have COPD exacerbation in the next 60 days   THN Long Term Goal Start Date  11/30/14   THN CM Short Term Goal #1 (0-30 days)  Patient will report signs and symptoms of COPD exacerbation and will keep appointment with pulmonologist over next 30 days   THN CM Short Term Goal #1 Start Date  11/30/14   Interventions for Short Term Goal #2   Using teachback method reviewed signs and symptoms of COPD to report to MD. Call to Dr. Luan Pulling office left VM regarding patient is out of anoro and oxygen has been returned to company

## 2014-12-12 DIAGNOSIS — I10 Essential (primary) hypertension: Secondary | ICD-10-CM | POA: Diagnosis not present

## 2014-12-12 DIAGNOSIS — J449 Chronic obstructive pulmonary disease, unspecified: Secondary | ICD-10-CM | POA: Diagnosis not present

## 2014-12-12 DIAGNOSIS — E119 Type 2 diabetes mellitus without complications: Secondary | ICD-10-CM | POA: Diagnosis not present

## 2014-12-25 ENCOUNTER — Ambulatory Visit (INDEPENDENT_AMBULATORY_CARE_PROVIDER_SITE_OTHER): Payer: Commercial Managed Care - HMO | Admitting: Family Medicine

## 2014-12-25 ENCOUNTER — Encounter: Payer: Self-pay | Admitting: Family Medicine

## 2014-12-25 VITALS — BP 150/70 | HR 84 | Resp 16 | Ht 70.0 in | Wt 242.0 lb

## 2014-12-25 DIAGNOSIS — M25562 Pain in left knee: Secondary | ICD-10-CM

## 2014-12-25 DIAGNOSIS — E1065 Type 1 diabetes mellitus with hyperglycemia: Secondary | ICD-10-CM

## 2014-12-25 DIAGNOSIS — M129 Arthropathy, unspecified: Secondary | ICD-10-CM

## 2014-12-25 DIAGNOSIS — Z0000 Encounter for general adult medical examination without abnormal findings: Secondary | ICD-10-CM | POA: Diagnosis not present

## 2014-12-25 DIAGNOSIS — M1711 Unilateral primary osteoarthritis, right knee: Secondary | ICD-10-CM

## 2014-12-25 DIAGNOSIS — IMO0001 Reserved for inherently not codable concepts without codable children: Secondary | ICD-10-CM

## 2014-12-25 DIAGNOSIS — Z794 Long term (current) use of insulin: Secondary | ICD-10-CM

## 2014-12-25 DIAGNOSIS — I10 Essential (primary) hypertension: Secondary | ICD-10-CM

## 2014-12-25 DIAGNOSIS — E1165 Type 2 diabetes mellitus with hyperglycemia: Secondary | ICD-10-CM

## 2014-12-25 DIAGNOSIS — E785 Hyperlipidemia, unspecified: Secondary | ICD-10-CM

## 2014-12-25 DIAGNOSIS — I2510 Atherosclerotic heart disease of native coronary artery without angina pectoris: Secondary | ICD-10-CM | POA: Diagnosis not present

## 2014-12-25 MED ORDER — METOPROLOL TARTRATE 50 MG PO TABS
50.0000 mg | ORAL_TABLET | Freq: Two times a day (BID) | ORAL | Status: DC
Start: 2014-12-25 — End: 2015-05-18

## 2014-12-25 MED ORDER — SPIRONOLACTONE 25 MG PO TABS: 25.0000 mg | ORAL_TABLET | Freq: Every day | ORAL | Status: DC

## 2014-12-25 NOTE — Progress Notes (Signed)
Subjective:    Patient ID: [name], male    DOB: [birth year], 74 y.o.   MRN: [id number]  HPI Preventive Screening-Counseling & Management   Patient present here today for a Medicare annual wellness visit.   Current Problems (verified)   Medications Prior to Visit Allergies (verified)   PAST HISTORY  Family History (verified)   Social History Married father of 2 adult children, daughter and son, retired at age 61, was at [employer]  Current exercise habits:  Does yard work, walks a lot outside but not too far due to breathing problems   Dietary issues discussed: low carb and low fat diet encouraged   Cardiac risk factors: Type 2 diabtetes, established CAD  Depression Screen  (Note: if answer to either of the following is "Yes", a more complete depression screening is indicated)   Over the past two weeks, have you felt down, depressed or hopeless? No  Over the past two weeks, have you felt little interest or pleasure in doing things? No  Have you lost interest or pleasure in daily life? No  Do you often feel hopeless? No  Do you cry easily over simple problems? No   Activities of Daily Living  In your present state of health, do you have any difficulty performing the following activities?  Driving?: No Managing money?: No Feeding yourself?:No Getting from bed to chair?:No Climbing a flight of stairs?: if he takes his time  Preparing food and eating?:No Bathing or showering?:No Getting dressed?:No Getting to the toilet?:No Using the toilet?:No Moving around from place to place?: Yes will get short of breath, also has left leg pain and stiffness  Fall Risk Assessment In the past year have you fallen or had a near fall?:no  Are you currently taking any medications that make you dizzy?:No   Hearing Difficulties: No Do you often ask people to speak up or repeat themselves?:No Do you experience ringing or noises in your ears?:No Do you have  difficulty understanding soft or whispered voices?:No  Cognitive Testing  Alert? Yes Normal Appearance?Yes  Oriented to person? Yes Place? Yes  Time? Yes  Displays appropriate judgment?Yes  Can read the correct time from a watch face? yes Are you having problems remembering things?No  Advanced Directives have been discussed with the patient?Yes, doesn't have one. WIll give a brochure  , full code   List the Names of Other Physician/Practitioners you currently use:  Dr Luan Pulling (pulmonary)  Dr Candie Echevaria (cardiology) Dr [name] (endo)   Indicate any recent Medical Services you may have received from other than [facility] providers in the past year (date may be approximate).   Assessment:    Annual Wellness Exam   Plan:      Patient Instructions (the written plan) was given to the patient.  Medicare Attestation  I have personally reviewed:  The patient's medical and social history  Their use of alcohol, tobacco or illicit drugs  Their current medications and supplements  The patient's functional ability including ADLs,fall risks, home safety risks, cognitive, and hearing and visual impairment  Diet and physical activities  Evidence for depression or mood disorders  The patient's weight, height, BMI, and visual acuity have been recorded in the chart. I have made referrals, counseling, and provided education to the patient based on review of the above and I have provided the patient with a written personalized care plan for preventive services.      Review of Systems  Objective:   Physical Exam  BP 150/70 mmHg  Pulse 84  Resp 16  Ht 5\' 10"  (1.778 m)  Wt 242 lb (109.77 kg)  BMI 34.72 kg/m2  SpO2 95%   CVS: Heart sounds S1, S2, no S3 or S4 , systolic murmur present, no JVD, no edema  MS: decreased ROM spine and right knee     Assessment & Plan:  Medicare annual wellness visit, subsequent Annual exam as documented. Counseling done  re healthy lifestyle involving  commitment to 150 minutes exercise per week, heart healthy diet, and attaining healthy weight.The importance of adequate sleep also discussed. Regular seat belt use and home safety, is also discussed. Changes in health habits are decided on by the patient with goals and time frames  set for achieving them. Immunization and cancer screening needs are specifically addressed at this visit.   Essential hypertension elevated at vist, medical compliance remains an issue, currently out of some of his BP medication Dose adjustment also made, he is for nurse BP check in 5 weeks Involvement in [facility]  To assist with management of his multiple chronic conditions already in place DASH diet and commitment to daily physical activity for a minimum of 30 minutes discussed and encouraged, as a part of hypertension management. The importance of attaining a healthy weight is also discussed.  BP/Weight 12/28/2014 12/25/2014 11/30/2014 11/16/2014 11/14/2014 11/13/2014 4/97/0263  Systolic BP 785 885 027 741 [phone]  Diastolic BP 60 70 64 62 61 59 64  Wt. (Lbs) 242 242 229 230 230 230 230.12  BMI 34.72 34.72 32.86 33 33 33 32.11        Arthritis of knee, right Increased pain and stiffness, ortho to eval and treat , referred

## 2014-12-25 NOTE — Patient Instructions (Addendum)
Annual physical  End Novemebr, call if you need me before  BP is high today , out of medication, nurse BP check in 5 weeks,  I have sent in lopressor 50 mg ONE twice daily, and the spironolactone Reconsider the flu vaccine, you need this  Check pharmacy for shingles vaccine  We will see if [facility] can help you get some medication that you need that you do not have , the med from Dr Luan Pulling, Terrence Dupont and fenofibrate, brilinta 90 mg daily and elipta one puff daily   The prednisone 50 mg tab, if you ever need prednisone short term break in HAL:F do not take the whole will get blood sugar too high  Fasting lipid, cmp and EGFR  For Novemebr visit  Cut back on fried and fatty foods  Thanks for choosing [facility], we consider it a privelige to serve you.   You are referred to Dr Luna Glasgow for left knee pain

## 2014-12-27 ENCOUNTER — Other Ambulatory Visit: Payer: Self-pay | Admitting: *Deleted

## 2014-12-27 ENCOUNTER — Ambulatory Visit: Payer: Commercial Managed Care - HMO | Admitting: *Deleted

## 2014-12-27 ENCOUNTER — Encounter: Payer: Self-pay | Admitting: *Deleted

## 2014-12-27 DIAGNOSIS — Z596 Low income: Secondary | ICD-10-CM

## 2014-12-27 NOTE — Patient Outreach (Signed)
Arrived at patient home, no answer to door. Call to patient on his cell phone, he reports he thought our appointment was tomorrow, he states he provided transportation to the doctor for his sister.  Requests to reschedule. Plan to reschedule appointment for tomorrow am. Royetta Crochet. Laymond Purser, RN, BSN, [place] [phone]

## 2014-12-27 NOTE — Patient Outreach (Signed)
[facility]) Care Management  12/27/2014  HERO KULISH June 27, 1940 [id number]   Epic in basket message from [place] at Primary care office asking about medication assistance for patient for several medications that he is having difficulty affording.  Plan RNCM will place an order for [facility] pharmacy to review and see if any assistance may be available to patient for medication assistance.    Royetta Crochet. Laymond Purser, RN, BSN, [place] [phone]

## 2014-12-28 ENCOUNTER — Encounter: Payer: Self-pay | Admitting: *Deleted

## 2014-12-28 ENCOUNTER — Other Ambulatory Visit: Payer: Self-pay | Admitting: Family Medicine

## 2014-12-28 ENCOUNTER — Other Ambulatory Visit: Payer: Self-pay | Admitting: *Deleted

## 2014-12-28 NOTE — Patient Outreach (Signed)
[name]) Care Management   12/28/2014  [name] [birth year] [id number]  [name] is an 74 y.o. male  Subjective:  Patient reports he is doing well, except his BP was high on Monday at Dr. Griffin [name] office, and also Dr. Moshe [name] is going to refer to Dr. Luna [name] due to new intermittent pain in left femur area.  Patient states he is going to Dr. Dorris [name] in September, he is compliant with taking insulin and CBG's. Patient saw Dr. Luan [name], he said patient doing well, does not need to use oxygen so did not reorder, agreed for patient to use ceiling fan for the air circulation. MD did not reorder the anoro, still using the albuterol inhaler for shortness of breath.  Patient had a couple of days of elevated CBG's after reviewing chart noted patient had missed his insulin at night for 2 nights. Patient verbalizes he is usually compliant and reviewing his records he is consistent with both checking blood sugars 3-4 times a day and taking his insulin.   Objective:   BP 158/60 mmHg  Pulse 96  Resp 20  Ht 1.778 m (_0 )  Wt 242 lb (109.77 kg)  BMI 34.72 kg/m2  SpO2 97% Review of Systems  Constitutional: Negative.   HENT: Negative.   Eyes: Negative.   Respiratory: Negative.   Cardiovascular: Negative.   Gastrointestinal: Negative.   Genitourinary: Negative.   Musculoskeletal: Negative.   Skin: Negative.   Neurological: Negative.   Endo/Heme/Allergies: Negative.   Psychiatric/Behavioral: Negative.     Physical Exam  Constitutional: He is oriented to person, place, and time. He appears well-developed.  Neck: Normal range of motion.  Cardiovascular: Normal rate and regular rhythm.   Respiratory: Effort normal.  GI: Soft.  Musculoskeletal: Normal range of motion.  Neurological: He is alert and oriented to person, place, and time.  Skin: Skin is warm.    Current Medications:   Current Outpatient Prescriptions  Medication Sig Dispense Refill  .  albuterol (PROVENTIL HFA;VENTOLIN HFA) 108 (90 BASE) MCG/ACT inhaler Inhale 1-2 puffs into the lungs every 6 (six) hours as needed for wheezing or shortness of breath. 1 Inhaler 2  . allopurinol (ZYLOPRIM) 300 MG tablet Take 300 mg by mouth daily.    Marland Kitchen amLODipine (NORVASC) 10 MG tablet Take 1 tablet (10 mg total) by mouth daily. 90 tablet 1  . aspirin 81 MG tablet Take 81 mg by mouth daily.    . blood glucose meter kit and supplies KIT Dispense based on patient and insurance preference. Use up to four times daily as directed. (FOR ICD-9 250.00, 250.01). 1 each 0  . insulin NPH-regular Human (NOVOLIN 70/30) (70-30) 100 UNIT/ML injection Inject 45 Units into the skin 2 (two) times daily with a meal. 10 mL 2  . levothyroxine (SYNTHROID, LEVOTHROID) 75 MCG tablet Take 75 mcg by mouth every morning.     Marland Kitchen lisinopril (PRINIVIL,ZESTRIL) 40 MG tablet Take 1 tablet (40 mg total) by mouth daily. 90 tablet 1  . metFORMIN (GLUCOPHAGE) 500 MG tablet Take 1 tablet (500 mg total) by mouth 2 (two) times daily. 60 tablet 3  . metoprolol (LOPRESSOR) 50 MG tablet Take 1 tablet (50 mg total) by mouth 2 (two) times daily. 180 tablet 3  . pravastatin (PRAVACHOL) 40 MG tablet Take 1 tablet (40 mg total) by mouth daily. 90 tablet 1  . predniSONE (DELTASONE) 50 MG tablet 1 tablet for 5 days, one half tablet for 5 days 8 tablet 0  .  spironolactone (ALDACTONE) 25 MG tablet Take 1 tablet (25 mg total) by mouth daily. 90 tablet 3  . fenofibrate micronized (LOFIBRA) 67 MG capsule Take 1 capsule (67 mg total) by mouth daily before breakfast. (Patient not taking: Reported on 11/13/2014) 30 capsule 4   No current facility-administered medications for this visit.    Functional Status:   In your present state of health, do you have any difficulty performing the following activities: 11/16/2014  Hearing? N  Vision? N  Difficulty concentrating or making decisions? N  Walking or climbing stairs? N  Dressing or bathing? N  Doing  errands, shopping? N  Preparing Food and eating ? N  Using the Toilet? N  In the past six months, have you accidently leaked urine? N  Do you have problems with loss of bowel control? N  Managing your Medications? N  Managing your Finances? N  Housekeeping or managing your Housekeeping? N    Fall/Depression Screening:    Fall Risk  11/30/2014 11/16/2014 11/16/2014 12/08/2013 08/15/2013  Falls in the past year? Yes (No Data) No Yes No  Number falls in past yr: 1 1 - 1 -  Injury with Fall? No No - Yes -  Risk Factor Category  - - - High Fall Risk -  Risk for fall due to : History of fall(s);Impaired balance/gait - - - -  Follow up Falls evaluation completed Falls prevention discussed;Education provided - - -   PHQ 2/9 Scores 11/30/2014 11/16/2014 12/08/2013 08/15/2013  PHQ - 2 Score 0 0 0 0    Assessment:    Plan:  Epic in basket to [place] re: CBG monitor strips to increase number from 3x a day to 4x a day on  Prescription, also let her know about BP, patient still waiting for Metoprolol to come in from mail order pharmacy Route note to MD  Visit next month, possible discharge if no new needs Patient will continue to monitor CBG's, record for Dr. Dorris [name]  Patient will contact RNCM if any new needs or concerns.  [name]. John Cerezo, RN, BSN, [name] [phone]  [name] CM Care Plan Problem One        Patient Outreach from 12/28/2014 in [place] Problem One  Knowledge deficit related to diabetes AEB patient questions regarding hyperglycemia   Care Plan for Problem One  Active   THN Long Term Goal (31-90 days)  Patient CBG readings will show improvement over the next 90 days   THN Long Term Goal Start Date  11/16/14   Interventions for Problem One Long Term Goal  using teachback method reviewed meter for 7, 14, 28 day averages   THN CM Short Term Goal #1 (0-30 days)  Patient will be compliant with taking inuslin over the next 30 days   THN  CM Short Term Goal #1 Start Date  11/16/14   Interventions for Short Term Goal #1  Using teachback method reviewed with patient his CBG and insulin log he completes for Dr. Dorris [name] reinforced need for adherence to insulin as evidenced by his elevated  CBG's when he missed a couple of doses   THN CM Short Term Goal #3 (0-30 days)  Patient will be aware of signs and symptoms of low and high blood sugars over the next 30 days   THN CM Short Term Goal #3 Start Date  11/16/14   Interventions for Short Tern Goal #3  Using teachback reviewed again signs and  symptoms of high and low CBG    THN CM Care Plan Problem Two        Patient Outreach from 12/28/2014 in [place] for Problem Two  Active   Interventions for Problem Two Long Term Goal   using teachback method had patient tell RNCM signs and symptoms of COPD to report to MD   [facility] [name] Long Term Goal (31-90) days  Patient will not have COPD exacerbation in the next 60 days   THN Long Term Goal Start Date  11/30/14   THN CM Short Term Goal #1 (0-30 days)  Patient will report signs and symptoms of COPD exacerbation and will keep appointment with pulmonologist over next 30 days   THN CM Short Term Goal #1 Start Date  11/30/14   Interventions for Short Term Goal #2   Using teachback method verified patient aware of signs and symptoms of COPD to report to MD

## 2014-12-29 ENCOUNTER — Other Ambulatory Visit: Payer: Self-pay | Admitting: *Deleted

## 2014-12-29 NOTE — Patient Outreach (Signed)
Call to patient in response to message back from [place], nurse at Dr. Griffin Dakin office. Office will send in one time prescription for blood sugar testing strips to [facility]. Request that patient have Dr. [name] prescribe testing strips as he is monitoring diabetes for patient. Gave above information to patient he verbalizes understanding. Plan for visit in September. Royetta Crochet. Laymond Purser, RN, BSN, [place] [phone]

## 2015-01-13 ENCOUNTER — Encounter: Payer: Self-pay | Admitting: Family Medicine

## 2015-01-13 NOTE — Assessment & Plan Note (Signed)

## 2015-01-13 NOTE — Assessment & Plan Note (Signed)
Increased pain and stiffness, ortho to eval and treat , referred

## 2015-01-13 NOTE — Assessment & Plan Note (Signed)
elevated at vist, medical compliance remains an issue, currently out of some of his BP medication Dose adjustment also made, he is for nurse BP check in 5 weeks Involvement in [facility]  To assist with management of his multiple chronic conditions already in place DASH diet and commitment to daily physical activity for a minimum of 30 minutes discussed and encouraged, as a part of hypertension management. The importance of attaining a healthy weight is also discussed.  BP/Weight 12/28/2014 12/25/2014 11/30/2014 11/16/2014 11/14/2014 11/13/2014 7/35/3299  Systolic BP 242 683 419 [phone] 211  Diastolic BP 60 70 64 62 61 59 64  Wt. (Lbs) 242 242 229 230 230 230 230.12  BMI 34.72 34.72 32.86 33 33 33 32.11

## 2015-01-29 ENCOUNTER — Other Ambulatory Visit: Payer: Self-pay | Admitting: Family Medicine

## 2015-01-30 ENCOUNTER — Ambulatory Visit: Payer: Commercial Managed Care - HMO

## 2015-01-30 VITALS — BP 144/76

## 2015-01-30 DIAGNOSIS — I10 Essential (primary) hypertension: Secondary | ICD-10-CM

## 2015-01-30 NOTE — Progress Notes (Signed)
Patient in for nurse visit for blood pressure check.  Blood pressure checked manually.  Blood pressure of 144/76.  Patient is taking meds as prescribed.   Will continue meds and keep next scheduled appointment.

## 2015-01-31 ENCOUNTER — Encounter: Payer: Self-pay | Admitting: *Deleted

## 2015-01-31 ENCOUNTER — Other Ambulatory Visit: Payer: Commercial Managed Care - HMO | Admitting: *Deleted

## 2015-01-31 VITALS — BP 134/82 | HR 70 | Resp 20 | Ht 70.0 in | Wt 230.0 lb

## 2015-01-31 NOTE — Patient Outreach (Signed)
[place] [facility]) Care Management  01/31/2015  [name] 20-Jun-1940 [id number]   Request from Kandis Mannan, RN to assign Pharmacy, assigned Deanne Coffer, PharmD.  Thanks, Ronnell Freshwater. [place], [place] Assistant Phone: [phone] Fax: [phone]

## 2015-01-31 NOTE — Patient Outreach (Signed)
[name]) Care Management   01/31/2015  [name] 07/09/1940 [id number]  [name] is an 74 y.o. male  Subjective:  Patient states he had "blood check yesterday" and it was fine, recheck of BP Patient reports he has some upcoming appointments: Dr. [name] 9/26, labs on 9/19 (A1C, thyroid and Basic Met Panel) Dr. Terrence Dupont 02/28/15 Dr. Moshe Cipro 05/09/15 Dr. Curt Jews year" Patient also has referral in to see Dr. Luna Glasgow for left leg/thigh pain, states referral was for left knee and they are having to change the referral to reflect correct issue.   Patient states he agrees to transfer to Health coach would like focus on his COPD as he feels he has his diabetes under good control.   Patient denies any pain at this moment but is having pain in left thigh when he bends over, going to ortho for pain.  Patient states Dr. Luan Pulling agreed with him not being on his oxygen, patient states he runs a ceiling fan at night and it helps him breath. Patient also staying in the house during high heat. He has wife turn on car and a/c before he gets into the car.   Patient out of strips again, he states he only get 100 and is checking blood sugars 4 times a day, needs increased RX, he has them ordered for this time, expects to pick up this pm. Patient is completing a blood sugar log for endocrinologist.   Objective: Patient neatly groomed and dressed, very talkative during visit.    BP 134/82 mmHg  Pulse 70  Resp 20  Ht 1.778 m ($Remove'5\' 10"'NtebZHV$ )  Wt 230 lb (104.327 kg)  BMI 33.00 kg/m2  SpO2 96% Review of Systems  Constitutional: Negative.   HENT: Negative.   Eyes: Negative.   Respiratory: Negative.   Cardiovascular: Negative.   Gastrointestinal: Negative.   Genitourinary: Negative.   Musculoskeletal: Negative.   Skin: Negative.   Neurological: Negative.   Endo/Heme/Allergies: Negative.   Psychiatric/Behavioral: Negative.     Physical Exam  Constitutional: He is  oriented to person, place, and time. He appears well-developed.  HENT:  Head: Normocephalic.  Neck: Normal range of motion.  Cardiovascular: Normal rate and regular rhythm.   Respiratory: Effort normal and breath sounds normal.  GI: Soft. Bowel sounds are normal.  Musculoskeletal: Normal range of motion.  Neurological: He is alert and oriented to person, place, and time.  Skin: Skin is warm.    Current Medications:   Current Outpatient Prescriptions  Medication Sig Dispense Refill  . acetaminophen (TYLENOL) 500 MG tablet Take 500 mg by mouth every 6 (six) hours as needed for moderate pain (left thigh).    Marland Kitchen albuterol (PROVENTIL HFA;VENTOLIN HFA) 108 (90 BASE) MCG/ACT inhaler Inhale 1-2 puffs into the lungs every 6 (six) hours as needed for wheezing or shortness of breath. 1 Inhaler 2  . allopurinol (ZYLOPRIM) 300 MG tablet Take 300 mg by mouth daily.    Marland Kitchen amLODipine (NORVASC) 10 MG tablet Take 1 tablet (10 mg total) by mouth daily. 90 tablet 1  . aspirin 81 MG tablet Take 81 mg by mouth daily.    . blood glucose meter kit and supplies KIT Dispense based on patient and insurance preference. Use up to four times daily as directed. (FOR ICD-9 250.00, 250.01). 1 each 0  . fenofibrate micronized (LOFIBRA) 67 MG capsule Take 1 capsule (67 mg total) by mouth daily before breakfast. 30 capsule 4  . glucose blood (ACCU-CHEK AVIVA PLUS) test  strip Use one strip to check glucose four times daily 100 each 0  . insulin NPH-regular Human (NOVOLIN 70/30) (70-30) 100 UNIT/ML injection Inject 45 Units into the skin 2 (two) times daily with a meal. 10 mL 2  . levothyroxine (SYNTHROID, LEVOTHROID) 75 MCG tablet Take 75 mcg by mouth every morning.     Marland Kitchen lisinopril (PRINIVIL,ZESTRIL) 40 MG tablet Take 1 tablet (40 mg total) by mouth daily. 90 tablet 1  . metFORMIN (GLUCOPHAGE) 500 MG tablet Take 1 tablet (500 mg total) by mouth 2 (two) times daily. 60 tablet 3  . metoprolol (LOPRESSOR) 50 MG tablet Take 1  tablet (50 mg total) by mouth 2 (two) times daily. 180 tablet 3  . pravastatin (PRAVACHOL) 40 MG tablet Take 1 tablet (40 mg total) by mouth daily. 90 tablet 1  . predniSONE (DELTASONE) 50 MG tablet 1 tablet for 5 days, one half tablet for 5 days 8 tablet 0  . spironolactone (ALDACTONE) 25 MG tablet Take 1 tablet (25 mg total) by mouth daily. 90 tablet 3   No current facility-administered medications for this visit.    Functional Status:   In your present state of health, do you have any difficulty performing the following activities: 01/31/2015 11/16/2014  Hearing? N N  Vision? N N  Difficulty concentrating or making decisions? N N  Walking or climbing stairs? N N  Dressing or bathing? N N  Doing errands, shopping? N N  Preparing Food and eating ? N N  Using the Toilet? N N  In the past six months, have you accidently leaked urine? N N  Do you have problems with loss of bowel control? N N  Managing your Medications? N N  Managing your Finances? N N  Housekeeping or managing your Housekeeping? N N    Fall/Depression Screening:    Fall Risk  01/31/2015 11/30/2014 11/16/2014 11/16/2014 12/08/2013  Falls in the past year? No Yes (No Data) No Yes  Number falls in past yr: - 1 1 - 1  Injury with Fall? - No No - Yes  Risk Factor Category  - - - - High Fall Risk  Risk for fall due to : - History of fall(s);Impaired balance/gait - - -  Follow up - Falls evaluation completed Falls prevention discussed;Education provided - -   PHQ 2/9 Scores 01/31/2015 11/30/2014 11/16/2014 12/08/2013 08/15/2013  PHQ - 2 Score 0 0 0 0 0    Assessment:   COPD Diabetes Medication assistance needs.  Plan:  Reviewed COPD exacerbation signs and symptoms. Reviewed Blood sugar log, running in low 100's to low 200's, checking 4 times a day, instructed patient to get Dr. [name] to write prescription for blood sugar test strips to cover 4x a day.  Reviewed medications with patient Refer to pharmacy for medication  assistance Refer to Health Coach for COPD disease management  [facility] CM Care Plan Problem One        Most Recent Value   Care Plan Problem One  Knowledge deficit related to diabetes AEB patient questions regarding hyperglycemia   Role Documenting the Problem One  Care Management Coordinator   Care Plan for Problem One  Not Active    [name] CM Care Plan Problem Two        Most Recent Value   Care Plan Problem Two  High risk for COPD exacerbation   Role Documenting the Problem Two  Care Management Coordinator   Care Plan for Problem Two  Active   Interventions  for Problem Two Long Term Goal   Using teachback methond reviewed signs and symptoms of COPD exacerbation and also reivewed use of medications   [facility] Long Term Goal (31-90) days  Patient will not have COPD exacerbation in the next 60 days   [facility] Long Term Goal Start Date  01/31/15   [facility] CM Short Term Goal #1 (0-30 days)  Patient will report signs and symptoms of COPD exacerbation and will keep appointment with pulmonologist over next 30 days   [name] CM Short Term Goal #1 Met Date   01/31/15     Royetta Crochet. Laymond Purser, RN, BSN, [place] [phone]

## 2015-01-31 NOTE — Patient Outreach (Signed)
[place] [facility]) Care Management  01/31/2015  [name] [birth year] [id number]   Request from Kandis Mannan, RN to assigned RN Health Coach, assigned [name], RN.  Thanks, Ronnell Freshwater. [place], [place] Assistant Phone: [phone] Fax: [phone]

## 2015-02-02 ENCOUNTER — Other Ambulatory Visit: Payer: Self-pay

## 2015-02-02 ENCOUNTER — Other Ambulatory Visit: Payer: Self-pay | Admitting: Pharmacist

## 2015-02-02 NOTE — Patient Outreach (Signed)
[place] [facility]) Care Management  [place]   02/02/2015  HYDEN SOLEY 05/10/1941 [id number]  Subjective: [name] is a 74 y.o. male who was referred to [place] for medication assistance. It is reported that patient cannot afford fenofibrate, Brilinta, and Ellipta.  Patient also reports that he cannot afford his insulin. He is receiving his medications through [facility] mail order and they are costing too much money for him to afford the Brand name drugs.  Patient reports that he has all of his medications currently.   Patient denies that he has applied for Medicare Extra Help before.   Objective:   Current Medications: Current Outpatient Prescriptions  Medication Sig Dispense Refill  . acetaminophen (TYLENOL) 500 MG tablet Take 500 mg by mouth every 6 (six) hours as needed for moderate pain (left thigh).    Marland Kitchen albuterol (PROVENTIL HFA;VENTOLIN HFA) 108 (90 BASE) MCG/ACT inhaler Inhale 1-2 puffs into the lungs every 6 (six) hours as needed for wheezing or shortness of breath. 1 Inhaler 2  . allopurinol (ZYLOPRIM) 300 MG tablet Take 300 mg by mouth daily.    Marland Kitchen amLODipine (NORVASC) 10 MG tablet Take 1 tablet (10 mg total) by mouth daily. 90 tablet 1  . aspirin 81 MG tablet Take 81 mg by mouth daily.    . blood glucose meter kit and supplies KIT Dispense based on patient and insurance preference. Use up to four times daily as directed. (FOR ICD-9 250.00, 250.01). 1 each 0  . fenofibrate micronized (LOFIBRA) 67 MG capsule Take 1 capsule (67 mg total) by mouth daily before breakfast. 30 capsule 4  . glucose blood (ACCU-CHEK AVIVA PLUS) test strip Use one strip to check glucose four times daily 100 each 0  . insulin NPH-regular Human (NOVOLIN 70/30) (70-30) 100 UNIT/ML injection Inject 45 Units into the skin 2 (two) times daily with a meal. 10 mL 2  . levothyroxine (SYNTHROID, LEVOTHROID) 75 MCG tablet Take 75 mcg by mouth every morning.     Marland Kitchen lisinopril  (PRINIVIL,ZESTRIL) 40 MG tablet Take 1 tablet (40 mg total) by mouth daily. 90 tablet 1  . metFORMIN (GLUCOPHAGE) 500 MG tablet Take 1 tablet (500 mg total) by mouth 2 (two) times daily. 60 tablet 3  . metoprolol (LOPRESSOR) 50 MG tablet Take 1 tablet (50 mg total) by mouth 2 (two) times daily. 180 tablet 3  . pravastatin (PRAVACHOL) 40 MG tablet Take 1 tablet (40 mg total) by mouth daily. 90 tablet 1  . predniSONE (DELTASONE) 50 MG tablet 1 tablet for 5 days, one half tablet for 5 days 8 tablet 0  . spironolactone (ALDACTONE) 25 MG tablet Take 1 tablet (25 mg total) by mouth daily. 90 tablet 3   No current facility-administered medications for this visit.    Functional Status: In your present state of health, do you have any difficulty performing the following activities: 01/31/2015 11/16/2014  Hearing? N N  Vision? N N  Difficulty concentrating or making decisions? N N  Walking or climbing stairs? N N  Dressing or bathing? N N  Doing errands, shopping? N N  Preparing Food and eating ? N N  Using the Toilet? N N  In the past six months, have you accidently leaked urine? N N  Do you have problems with loss of bowel control? N N  Managing your Medications? N N  Managing your Finances? N N  Housekeeping or managing your Housekeeping? N N    Fall/Depression Screening:  PHQ 2/9 Scores 01/31/2015 11/30/2014 11/16/2014 12/08/2013 08/15/2013  PHQ - 2 Score 0 0 0 0 0    Assessment: 1. Medication assistance: patient in need of assistance for medications Ellipta, fenofibrate, Brilinta, and insulin. Patient currently has these medications so no emergency funds need to be used.   Plan: 1. Medication assistance: will send a referral to [place], care management assistant, for medication assistance programs and Medicare Extra Help Application. Also made patient aware that he can get his insulin at [facility] for $25 per vial in case of emergencies Patient verbalized understanding. Will be available  for further assistance.    Nicoletta Ba, PharmD, [facility] [phone]

## 2015-02-02 NOTE — Patient Outreach (Signed)
[place] [facility]) Care Management  02/02/2015  [name] [birth year] [id number]   Telephone call to patient for health coach introductory call.  No answer.  Unable to leave a message.   Plan: RN Health Coach will attempt patient again in 1-2 weeks.    Jone Baseman, RN, MSN [place] [phone]

## 2015-02-05 ENCOUNTER — Other Ambulatory Visit: Payer: Self-pay | Admitting: *Deleted

## 2015-02-05 DIAGNOSIS — I10 Essential (primary) hypertension: Secondary | ICD-10-CM | POA: Diagnosis not present

## 2015-02-05 DIAGNOSIS — E1122 Type 2 diabetes mellitus with diabetic chronic kidney disease: Secondary | ICD-10-CM | POA: Diagnosis not present

## 2015-02-05 DIAGNOSIS — E782 Mixed hyperlipidemia: Secondary | ICD-10-CM | POA: Diagnosis not present

## 2015-02-05 NOTE — Patient Outreach (Signed)
VM left for RNCM to contact patient. Call to patient, patient reports he had issues getting his strips over weekend. He is asking about getting them directly from [facility] versus getting them at local pharmacy.  RNCM re-instructed patient to have endocrinologist, Dr. [name], fax a prescription for diabetic supplies with the correct number of times he is to check his blood sugar daily to [place] and they will fill this and he should not run out of supplies at the end of the month.  Patient able to verbalize instructions and agrees to them. Plan is that patient has been  referred to [facility] coach. Community RNCM signing off case.  Royetta Crochet. Laymond Purser, RN, BSN, [place] [phone]

## 2015-02-08 ENCOUNTER — Other Ambulatory Visit: Payer: Self-pay

## 2015-02-08 NOTE — Patient Outreach (Signed)
[place] [facility]) Care Management  02/08/2015  [name] 24-Aug-1940 [id number]   Telephone call to patient for Health Coach introductory call.  Patient reports he is doing well and is receptive to call.  Advised on health coach role and next outreach.   Plan: RN Health Coach will contact patient within one month and patient agrees to next contact.    Jone Baseman, RN, MSN [place] [phone]

## 2015-02-11 DIAGNOSIS — N3000 Acute cystitis without hematuria: Secondary | ICD-10-CM | POA: Insufficient documentation

## 2015-02-11 DIAGNOSIS — J441 Chronic obstructive pulmonary disease with (acute) exacerbation: Secondary | ICD-10-CM | POA: Insufficient documentation

## 2015-02-11 NOTE — Assessment & Plan Note (Signed)
Uncontrolled,  LDL at  goal of less than 70, medication compliance is a challenge, also dietary compliance , TG markedly elevated Hyperlipidemia:Low fat diet discussed and encouraged.   Lipid Panel  Lab Results  Component Value Date   CHOL 148 08/18/2014   HDL 17* 08/18/2014   LDLCALC NOT CALC 08/18/2014   LDLDIRECT 50 08/18/2014   TRIG 724* 08/18/2014   CHOLHDL 8.7 08/18/2014   Updated lab needed at/ before next visit.

## 2015-02-11 NOTE — Assessment & Plan Note (Signed)
Neb treatment and depo medrol in office followed by prednisone dose pack

## 2015-02-11 NOTE — Progress Notes (Signed)
Subjective:    Patient ID: [name], male    DOB: Oct 29, 1940, 74 y.o.   MRN: [id number]  HPI 4 day h/o increased cough, chest tightness and wheezing. Denies fever or chills, denies sputum production. Blood sugar remains high and fluctuates a lot, still severely challenged as far as sticking to diet   Review of Systems See HPI Denies recent fever or chills. Denies sinus pressure, nasal congestion, ear pain or sore throat. Denies chest pains, palpitations and leg swelling Denies abdominal pain, nausea, vomiting,diarrhea or constipation.   C/o mild dysuria and malodorous urine with frequency for past 3 days , has past UTI history Denies severe joint pain, swelling and limitation in mobility. Denies headaches, seizures, numbness, or tingling. Denies depression, anxiety or insomnia. Denies skin break down or rash.        Objective:   Physical Exam BP 130/64 mmHg  Pulse 87  Temp(Src) 99.2 F (37.3 C) (Oral)  Resp 16  Ht 5\' 11"  (1.803 m)  Wt 230 lb 1.9 oz (104.382 kg)  BMI 32.11 kg/m2  SpO2 95% Patient alert and oriented and in pulmonary distress.  HEENT: No facial asymmetry, EOMI,   oropharynx pink and moist.  Neck supple no JVD, no mass.  Chest: Markedly decreased air entry , bilateral wheezing prior to neb treatment, marked improvement post treatment  CVS: S1, S2 no murmurs, no S3.Regular rate.  ABD: Soft non tender. No renal angle or suprapubic tenderness  Ext: No edema  MS: Adequate ROM spine, shoulders, hips and knees.  Skin: Intact, no ulcerations or rash noted.  Psych: Good eye contact, normal affect. Memory intact not anxious or depressed appearing.  CNS: CN 2-12 intact, power,  normal throughout.no focal deficits noted.        Assessment & Plan:   COPD with acute exacerbation Neb treatment and depo medrol in office followed by prednisone dose pack  Diabetes mellitus, insulin dependent (IDDM), uncontrolled Mr. Werth is reminded of the  importance of commitment to daily physical activity for 30 minutes or more, as able and the need to limit carbohydrate intake to 30 to 60 grams per meal to help with blood sugar control.   The need to take medication as prescribed, test blood sugar as directed, and to call between visits if there is a concern that blood sugar is uncontrolled is also discussed.   Mr. Merrick is reminded of the importance of daily foot exam, annual eye examination, and good blood sugar, blood pressure and cholesterol control. Uncontrolled despite endo ivolvement , pt needs to engage more consistently in his diabetic care  Diabetic Labs Latest Ref Rng 11/14/2014 11/13/2014 08/18/2014 06/14/2014 03/23/2014  HbA1c <5.7 % - 12.3(H) - - -  Microalbumin <2.0 mg/dL - - - - 7.8(H)  Micro/Creat Ratio 0.0 - 30.0 mg/g - - - - 36.6(H)  Chol 0 - 200 mg/dL - - 148 134 -  HDL >=40 mg/dL - - 17(L) 30(L) -  Calc LDL 0 - 99 mg/dL - - NOT CALC 37 -  Triglycerides <150 mg/dL - - 724(H) 333(H) -  Creatinine 0.61 - 1.24 mg/dL 1.46(H) 1.48(H) 0.91 0.91 -   BP/Weight 01/31/2015 01/30/2015 12/28/2014 12/25/2014 11/30/2014 11/16/2014 3/53/6144  Systolic BP 315 400 867 619 509 326 712  Diastolic BP 82 76 60 70 64 62 61  Wt. (Lbs) 230 - 242 242 229 230 230  BMI 33 - 34.72 34.72 32.86 33 33   Foot/eye exam completion dates Latest Ref Rng 12/28/2014 11/16/2014  Eye Exam No Retinopathy - -  Foot exam Order - - -  Foot Form Completion - Done Done         Hyperlipidemia with target LDL less than 70 Uncontrolled,  LDL at  goal of less than 70, medication compliance is a challenge, also dietary compliance , TG markedly elevated Hyperlipidemia:Low fat diet discussed and encouraged.   Lipid Panel  Lab Results  Component Value Date   CHOL 148 08/18/2014   HDL 17* 08/18/2014   LDLCALC NOT CALC 08/18/2014   LDLDIRECT 50 08/18/2014   TRIG 724* 08/18/2014   CHOLHDL 8.7 08/18/2014   Updated lab needed at/ before next  visit.      Essential hypertension Controlled, no change in medication DASH diet and commitment to daily physical activity for a minimum of 30 minutes discussed and encouraged, as a part of hypertension management. The importance of attaining a healthy weight is also discussed.  BP/Weight 01/31/2015 01/30/2015 12/28/2014 12/25/2014 11/30/2014 11/16/2014 4/94/4967  Systolic BP 591 638 466 599 357 017 793  Diastolic BP 82 76 60 70 64 62 61  Wt. (Lbs) 230 - 242 242 229 230 230  BMI 33 - 34.72 34.72 32.86 33 33        Acute cystitis without hematuria Symptomatic with mildly abn UA, will f/u c/s prior to prescribing antibiotic

## 2015-02-11 NOTE — Assessment & Plan Note (Signed)
Symptomatic with mildly abn UA, will f/u c/s prior to prescribing antibiotic

## 2015-02-11 NOTE — Assessment & Plan Note (Signed)
Controlled, no change in medication DASH diet and commitment to daily physical activity for a minimum of 30 minutes discussed and encouraged, as a part of hypertension management. The importance of attaining a healthy weight is also discussed.  BP/Weight 01/31/2015 01/30/2015 12/28/2014 12/25/2014 11/30/2014 11/16/2014 3/41/9622  Systolic BP 297 989 211 941 740 814 481  Diastolic BP 82 76 60 70 64 62 61  Wt. (Lbs) 230 - 242 242 229 230 230  BMI 33 - 34.72 34.72 32.86 33 33

## 2015-02-11 NOTE — Assessment & Plan Note (Signed)
[name] is reminded of the importance of commitment to daily physical activity for 30 minutes or more, as able and the need to limit carbohydrate intake to 30 to 60 grams per meal to help with blood sugar control.   The need to take medication as prescribed, test blood sugar as directed, and to call between visits if there is a concern that blood sugar is uncontrolled is also discussed.   [name] is reminded of the importance of daily foot exam, annual eye examination, and good blood sugar, blood pressure and cholesterol control. Uncontrolled despite endo ivolvement , pt needs to engage more consistently in his diabetic care  Diabetic Labs Latest Ref Rng 11/14/2014 11/13/2014 08/18/2014 06/14/2014 03/23/2014  HbA1c <5.7 % - 12.3(H) - - -  Microalbumin <2.0 mg/dL - - - - 7.8(H)  Micro/Creat Ratio 0.0 - 30.0 mg/g - - - - 36.6(H)  Chol 0 - 200 mg/dL - - 148 134 -  HDL >=40 mg/dL - - 17(L) 30(L) -  Calc LDL 0 - 99 mg/dL - - NOT CALC 37 -  Triglycerides <150 mg/dL - - 724(H) 333(H) -  Creatinine 0.61 - 1.24 mg/dL 1.46(H) 1.48(H) 0.91 0.91 -   BP/Weight 01/31/2015 01/30/2015 12/28/2014 12/25/2014 11/30/2014 11/16/2014 2/95/2841  Systolic BP 324 401 027 253 664 403 474  Diastolic BP 82 76 60 70 64 62 61  Wt. (Lbs) 230 - 242 242 229 230 230  BMI 33 - 34.72 34.72 32.86 33 33   Foot/eye exam completion dates Latest Ref Rng 12/28/2014 11/16/2014  Eye Exam No Retinopathy - -  Foot exam Order - - -  Foot Form Completion - Done Done

## 2015-02-12 DIAGNOSIS — E1122 Type 2 diabetes mellitus with diabetic chronic kidney disease: Secondary | ICD-10-CM | POA: Diagnosis not present

## 2015-02-12 DIAGNOSIS — E1159 Type 2 diabetes mellitus with other circulatory complications: Secondary | ICD-10-CM | POA: Diagnosis not present

## 2015-02-12 DIAGNOSIS — E039 Hypothyroidism, unspecified: Secondary | ICD-10-CM | POA: Diagnosis not present

## 2015-02-12 DIAGNOSIS — I10 Essential (primary) hypertension: Secondary | ICD-10-CM | POA: Diagnosis not present

## 2015-02-12 DIAGNOSIS — E785 Hyperlipidemia, unspecified: Secondary | ICD-10-CM | POA: Diagnosis not present

## 2015-02-12 NOTE — Patient Outreach (Signed)
[place]) Care Management  02/12/2015  [name] 09-30-1940 [id number]   Request received from Nicoletta Ba, PharmD to assist patient in extra help application and patient assistance applications. I called [name] today to enroll him into Extra Help.  He was willing to do it over the phone.  We completed the application online and submitted it today.  He should hear from social security administration in the next 2 to 4 weeks.  He is going to call me when he hears from them.  If he gets denied, I will begin the patient assistance application for [place] access, [place], and [place] patient assistance programs. I will follow up in 4 weeks.  Damita L. Rhodie, [place] Care Management Assistant

## 2015-02-19 ENCOUNTER — Other Ambulatory Visit: Payer: Self-pay

## 2015-02-19 MED ORDER — "INSULIN SYRINGE 31G X 5/16"" 1 ML MISC": 60.0000 [IU] | Freq: Two times a day (BID) | Status: DC

## 2015-02-27 ENCOUNTER — Other Ambulatory Visit: Payer: Self-pay

## 2015-02-27 NOTE — Patient Outreach (Signed)
[place] [facility]) Care Management  02/27/2015  JORDON KRISTIANSEN 01/10/1941 [id number]   Telephone call back to patient to instruct on medication assistance paperwork.  Instructed patient to follow instructions in letter included with information sent.  He verbalized understanding.    Jone Baseman, RN, MSN [place] [phone]

## 2015-02-27 NOTE — Patient Outreach (Signed)
[facility]) Care Management  [place]  02/27/2015   VA BROADWELL 03/16/1941 [id number]  Subjective:  Telephone call to patient for monthly outreach.  Patient reports he received a denial letter from medicare for medication assistance but shares he has some other papers. Patient able to read forms to health coach.  Advised patient to work on drug company medication assistance applications and that I would follow up once I found out more information from [facility] [employer], [place].  He verbalized understanding.  Patient reports next PCP appointment is in December.     COPD: Patient reports his breathing has been ok but did have an episode where he had to use his rescue inhaler this week.  Discussed with patient his COPD and what to do to prevent exacerbation.  Also discussed action plan with patient and will send one to patient.  He verbalized understanding. Patient reports he does not take the flu vaccine.    Objective:   Current Medications:  Current Outpatient Prescriptions  Medication Sig Dispense Refill  . acetaminophen (TYLENOL) 500 MG tablet Take 500 mg by mouth every 6 (six) hours as needed for moderate pain (left thigh).    Marland Kitchen albuterol (PROVENTIL HFA;VENTOLIN HFA) 108 (90 BASE) MCG/ACT inhaler Inhale 1-2 puffs into the lungs every 6 (six) hours as needed for wheezing or shortness of breath. 1 Inhaler 2  . allopurinol (ZYLOPRIM) 300 MG tablet Take 300 mg by mouth daily.    Marland Kitchen amLODipine (NORVASC) 10 MG tablet Take 1 tablet (10 mg total) by mouth daily. 90 tablet 1  . aspirin 81 MG tablet Take 81 mg by mouth daily.    . blood glucose meter kit and supplies KIT Dispense based on patient and insurance preference. Use up to four times daily as directed. (FOR ICD-9 250.00, 250.01). 1 each 0  . glucose blood (ACCU-CHEK AVIVA PLUS) test strip Use one strip to check glucose four times daily 100 each 0  . insulin NPH-regular Human (NOVOLIN 70/30) (70-30) 100 UNIT/ML  injection Inject 45 Units into the skin 2 (two) times daily with a meal. 10 mL 2  . Insulin Syringe-Needle U-100 (INSULIN SYRINGE 1CC/31GX5/16") 31G X 5/16" 1 ML MISC 60 Units by Does not apply route 2 (two) times daily. 300 each 2  . levothyroxine (SYNTHROID, LEVOTHROID) 75 MCG tablet Take 75 mcg by mouth every morning.     Marland Kitchen lisinopril (PRINIVIL,ZESTRIL) 40 MG tablet Take 1 tablet (40 mg total) by mouth daily. 90 tablet 1  . metFORMIN (GLUCOPHAGE) 500 MG tablet Take 1 tablet (500 mg total) by mouth 2 (two) times daily. 60 tablet 3  . pravastatin (PRAVACHOL) 40 MG tablet Take 1 tablet (40 mg total) by mouth daily. 90 tablet 1  . spironolactone (ALDACTONE) 25 MG tablet Take 1 tablet (25 mg total) by mouth daily. 90 tablet 3  . fenofibrate micronized (LOFIBRA) 67 MG capsule Take 1 capsule (67 mg total) by mouth daily before breakfast. (Patient not taking: Reported on 02/27/2015) 30 capsule 4  . metoprolol (LOPRESSOR) 50 MG tablet Take 1 tablet (50 mg total) by mouth 2 (two) times daily. (Patient not taking: Reported on 02/27/2015) 180 tablet 3  . predniSONE (DELTASONE) 50 MG tablet 1 tablet for 5 days, one half tablet for 5 days (Patient not taking: Reported on 02/27/2015) 8 tablet 0  . ticagrelor (BRILINTA) 90 MG TABS tablet Take 90 mg by mouth 2 (two) times daily.     No current facility-administered medications for this  visit.    Functional Status:  In your present state of health, do you have any difficulty performing the following activities: 02/27/2015 01/31/2015  Hearing? N N  Vision? N N  Difficulty concentrating or making decisions? N N  Walking or climbing stairs? N N  Dressing or bathing? N N  Doing errands, shopping? N N  Preparing Food and eating ? N N  Using the Toilet? N N  In the past six months, have you accidently leaked urine? N N  Do you have problems with loss of bowel control? N N  Managing your Medications? N N  Managing your Finances? N N  Housekeeping or managing  your Housekeeping? N N    Fall/Depression Screening: PHQ 2/9 Scores 02/27/2015 01/31/2015 11/30/2014 11/16/2014 12/08/2013 08/15/2013  PHQ - 2 Score 0 0 0 0 0 0    Assessment: Patient will benefit from health coach outreach for disease management.    Plan:  [facility] CM Care Plan Problem One        Most Recent Value   Care Plan Problem One  Knowledge deficit related to diabetes AEB patient questions regarding hyperglycemia   Role Documenting the Problem One  Care Management Coordinator   Care Plan for Problem One  Not Active    [facility] CM Care Plan Problem Two        Most Recent Value   Care Plan Problem Two  High risk for COPD exacerbation   Role Documenting the Problem Two  [place] for Problem Two  Active   Interventions for Problem Two Long Term Goal   Discussed with patient COPD action plan and green zone.  Health Coach will send patient COPD action plan.     THN Long Term Goal (31-90) days  Patient will not have COPD exacerbation in the next 60 days   THN Long Term Goal Start Date  01/31/15   THN CM Short Term Goal #1 (0-30 days)  Patient will be able to describe COPD green zone within 30 days.     THN CM Short Term Goal #1 Start Date  02/27/15   Interventions for Short Term Goal #2   Health Coach discussed with patient green zone of action plan.       RN Health Coach will provide ongoing education for patient on COPD through phone calls and sending printed information to patient for further discussion.  RN Health Coach will send  printed information on COPD.  RN Health Coach will send initial barriers letter, assessment, and care plan to primary care physician.  RN Health Coach will contact patient within one month and patient agrees to next contact.  Jone Baseman, RN, MSN [place] [phone]

## 2015-02-28 DIAGNOSIS — I119 Hypertensive heart disease without heart failure: Secondary | ICD-10-CM | POA: Diagnosis not present

## 2015-02-28 DIAGNOSIS — I2510 Atherosclerotic heart disease of native coronary artery without angina pectoris: Secondary | ICD-10-CM | POA: Diagnosis not present

## 2015-02-28 DIAGNOSIS — E669 Obesity, unspecified: Secondary | ICD-10-CM | POA: Diagnosis not present

## 2015-02-28 DIAGNOSIS — E119 Type 2 diabetes mellitus without complications: Secondary | ICD-10-CM | POA: Diagnosis not present

## 2015-02-28 DIAGNOSIS — E785 Hyperlipidemia, unspecified: Secondary | ICD-10-CM | POA: Diagnosis not present

## 2015-02-28 DIAGNOSIS — M1000 Idiopathic gout, unspecified site: Secondary | ICD-10-CM | POA: Diagnosis not present

## 2015-02-28 DIAGNOSIS — I252 Old myocardial infarction: Secondary | ICD-10-CM | POA: Diagnosis not present

## 2015-02-28 DIAGNOSIS — I209 Angina pectoris, unspecified: Secondary | ICD-10-CM | POA: Diagnosis not present

## 2015-03-06 DIAGNOSIS — I2510 Atherosclerotic heart disease of native coronary artery without angina pectoris: Secondary | ICD-10-CM | POA: Diagnosis not present

## 2015-03-06 DIAGNOSIS — E119 Type 2 diabetes mellitus without complications: Secondary | ICD-10-CM | POA: Diagnosis not present

## 2015-03-06 DIAGNOSIS — M1000 Idiopathic gout, unspecified site: Secondary | ICD-10-CM | POA: Diagnosis not present

## 2015-03-06 DIAGNOSIS — E785 Hyperlipidemia, unspecified: Secondary | ICD-10-CM | POA: Diagnosis not present

## 2015-03-06 DIAGNOSIS — I10 Essential (primary) hypertension: Secondary | ICD-10-CM | POA: Diagnosis not present

## 2015-03-27 ENCOUNTER — Other Ambulatory Visit: Payer: Self-pay

## 2015-03-27 NOTE — Patient Outreach (Signed)
[facility]) Care Management  [place]  03/27/2015   RAD GRAMLING 1941-01-12 [id number]  Subjective: Telephone call to patient for monthly outreach.  Patient reports he is doing ok. Patient reports morning blood sugar was 133. Patient denies any problems with diabetes.    COPD: Patient reports that his breathing has bothered him some.  He reports using his inhaler about once a day right now.  Discussed with patient COPD action plan.  Patient verbalized understanding.    Objective:   Current Medications:  Current Outpatient Prescriptions  Medication Sig Dispense Refill  . acetaminophen (TYLENOL) 500 MG tablet Take 500 mg by mouth every 6 (six) hours as needed for moderate pain (left thigh).    Marland Kitchen albuterol (PROVENTIL HFA;VENTOLIN HFA) 108 (90 BASE) MCG/ACT inhaler Inhale 1-2 puffs into the lungs every 6 (six) hours as needed for wheezing or shortness of breath. 1 Inhaler 2  . allopurinol (ZYLOPRIM) 300 MG tablet Take 300 mg by mouth daily.    Marland Kitchen amLODipine (NORVASC) 10 MG tablet Take 1 tablet (10 mg total) by mouth daily. 90 tablet 1  . aspirin 81 MG tablet Take 81 mg by mouth daily.    . blood glucose meter kit and supplies KIT Dispense based on patient and insurance preference. Use up to four times daily as directed. (FOR ICD-9 250.00, 250.01). 1 each 0  . glucose blood (ACCU-CHEK AVIVA PLUS) test strip Use one strip to check glucose four times daily 100 each 0  . insulin NPH-regular Human (NOVOLIN 70/30) (70-30) 100 UNIT/ML injection Inject 45 Units into the skin 2 (two) times daily with a meal. 10 mL 2  . Insulin Syringe-Needle U-100 (INSULIN SYRINGE 1CC/31GX5/16") 31G X 5/16" 1 ML MISC 60 Units by Does not apply route 2 (two) times daily. 300 each 2  . levothyroxine (SYNTHROID, LEVOTHROID) 75 MCG tablet Take 75 mcg by mouth every morning.     Marland Kitchen lisinopril (PRINIVIL,ZESTRIL) 40 MG tablet Take 1 tablet (40 mg total) by mouth daily. 90 tablet 1  . metFORMIN  (GLUCOPHAGE) 500 MG tablet Take 1 tablet (500 mg total) by mouth 2 (two) times daily. 60 tablet 3  . metoprolol (LOPRESSOR) 50 MG tablet Take 1 tablet (50 mg total) by mouth 2 (two) times daily. 180 tablet 3  . pravastatin (PRAVACHOL) 40 MG tablet Take 1 tablet (40 mg total) by mouth daily. 90 tablet 1  . spironolactone (ALDACTONE) 25 MG tablet Take 1 tablet (25 mg total) by mouth daily. 90 tablet 3  . ticagrelor (BRILINTA) 90 MG TABS tablet Take 90 mg by mouth 2 (two) times daily.    . fenofibrate micronized (LOFIBRA) 67 MG capsule Take 1 capsule (67 mg total) by mouth daily before breakfast. (Patient not taking: Reported on 02/27/2015) 30 capsule 4  . predniSONE (DELTASONE) 50 MG tablet 1 tablet for 5 days, one half tablet for 5 days (Patient not taking: Reported on 02/27/2015) 8 tablet 0   No current facility-administered medications for this visit.    Functional Status:  In your present state of health, do you have any difficulty performing the following activities: 02/27/2015 01/31/2015  Hearing? N N  Vision? N N  Difficulty concentrating or making decisions? N N  Walking or climbing stairs? N N  Dressing or bathing? N N  Doing errands, shopping? N N  Preparing Food and eating ? N N  Using the Toilet? N N  In the past six months, have you accidently leaked urine? N  N  Do you have problems with loss of bowel control? N N  Managing your Medications? N N  Managing your Finances? N N  Housekeeping or managing your Housekeeping? N N    Fall/Depression Screening: PHQ 2/9 Scores 03/27/2015 02/27/2015 01/31/2015 11/30/2014 11/16/2014 12/08/2013 08/15/2013  PHQ - 2 Score 0 0 0 0 0 0 0    Assessment:  Patient continues to benefit from health coach outreach for disease management.   Plan:  [facility] CM Care Plan Problem One        Most Recent Value   Care Plan Problem One  Knowledge deficit related to diabetes AEB patient questions regarding hyperglycemia   Role Documenting the Problem One  Care  Management Coordinator   Care Plan for Problem One  Not Active    [facility] CM Care Plan Problem Two        Most Recent Value   Care Plan Problem Two  High risk for COPD exacerbation   Role Documenting the Problem Two  [place] for Problem Two  Active   Interventions for Problem Two Long Term Goal   Reviewed COPD action plan.     THN Long Term Goal (31-90) days  Patient will not have COPD exacerbation in the next 60 days   THN Long Term Goal Start Date  03/27/15 [goal continued]   THN CM Short Term Goal #1 (0-30 days)  Patient will be able to describe COPD green zone within 30 days.     THN CM Short Term Goal #1 Start Date  03/27/15 [goal continued]   Interventions for Short Term Goal #2   Reviewed green zone with patient.     RN Health Coach will contact patient within one month and patient agrees to next outreach.    Jone Baseman, RN, MSN [place] [phone]

## 2015-03-31 DIAGNOSIS — E109 Type 1 diabetes mellitus without complications: Secondary | ICD-10-CM | POA: Diagnosis not present

## 2015-03-31 DIAGNOSIS — E1065 Type 1 diabetes mellitus with hyperglycemia: Secondary | ICD-10-CM | POA: Diagnosis not present

## 2015-03-31 DIAGNOSIS — E785 Hyperlipidemia, unspecified: Secondary | ICD-10-CM | POA: Diagnosis not present

## 2015-03-31 LAB — COMPLETE METABOLIC PANEL WITH GFR
ALBUMIN: 3.8 g/dL (ref 3.6–5.1)
ALK PHOS: 73 U/L (ref 40–115)
ALT: 7 U/L — AB (ref 9–46)
AST: 9 U/L — AB (ref 10–35)
BUN: 22 mg/dL (ref 7–25)
CALCIUM: 9.2 mg/dL (ref 8.6–10.3)
CO2: 24 mmol/L (ref 20–31)
CREATININE: 1.04 mg/dL (ref 0.70–1.18)
Chloride: 101 mmol/L (ref 98–110)
GFR, Est African American: 82 mL/min (ref >=60)
GFR, Est Non African American: 71 mL/min (ref >=60)
GLUCOSE: 123 mg/dL — AB (ref 65–99)
Potassium: 4.4 mmol/L (ref 3.5–5.3)
Sodium: 136 mmol/L (ref 135–146)
Total Bilirubin: 0.3 mg/dL (ref 0.2–1.2)
Total Protein: 6.7 g/dL (ref 6.1–8.1)

## 2015-03-31 LAB — LIPID PANEL
CHOLESTEROL: 128 mg/dL (ref 125–200)
HDL: 24 mg/dL — ABNORMAL LOW (ref >=40)
LDL Cholesterol: 64 mg/dL (ref <130)
Total CHOL/HDL Ratio: 5.3 Ratio — ABNORMAL HIGH (ref <=5.0)
Triglycerides: 201 mg/dL — ABNORMAL HIGH (ref <150)
VLDL: 40 mg/dL — AB (ref <30)

## 2015-04-06 ENCOUNTER — Telehealth: Payer: Self-pay | Admitting: "Endocrinology

## 2015-04-18 ENCOUNTER — Encounter (HOSPITAL_COMMUNITY): Payer: Self-pay | Admitting: Emergency Medicine

## 2015-04-18 ENCOUNTER — Observation Stay (HOSPITAL_COMMUNITY)
Admission: EM | Admit: 2015-04-18 | Discharge: 2015-04-20 | Disposition: A | Discharge status: Home / Self Care | Payer: Commercial Managed Care - HMO | Attending: Internal Medicine | Admitting: Internal Medicine

## 2015-04-18 ENCOUNTER — Emergency Department (HOSPITAL_COMMUNITY): Payer: Commercial Managed Care - HMO

## 2015-04-18 DIAGNOSIS — M542 Cervicalgia: Secondary | ICD-10-CM | POA: Diagnosis not present

## 2015-04-18 DIAGNOSIS — E784 Other hyperlipidemia: Secondary | ICD-10-CM | POA: Diagnosis not present

## 2015-04-18 DIAGNOSIS — Z87891 Personal history of nicotine dependence: Secondary | ICD-10-CM | POA: Insufficient documentation

## 2015-04-18 DIAGNOSIS — J441 Chronic obstructive pulmonary disease with (acute) exacerbation: Secondary | ICD-10-CM | POA: Diagnosis not present

## 2015-04-18 DIAGNOSIS — Z7982 Long term (current) use of aspirin: Secondary | ICD-10-CM | POA: Diagnosis not present

## 2015-04-18 DIAGNOSIS — E108 Type 1 diabetes mellitus with unspecified complications: Secondary | ICD-10-CM

## 2015-04-18 DIAGNOSIS — IMO0002 Reserved for concepts with insufficient information to code with codable children: Secondary | ICD-10-CM

## 2015-04-18 DIAGNOSIS — I2510 Atherosclerotic heart disease of native coronary artery without angina pectoris: Secondary | ICD-10-CM | POA: Diagnosis present

## 2015-04-18 DIAGNOSIS — D649 Anemia, unspecified: Secondary | ICD-10-CM | POA: Diagnosis present

## 2015-04-18 DIAGNOSIS — E1142 Type 2 diabetes mellitus with diabetic polyneuropathy: Secondary | ICD-10-CM | POA: Diagnosis present

## 2015-04-18 DIAGNOSIS — S199XXA Unspecified injury of neck, initial encounter: Secondary | ICD-10-CM | POA: Diagnosis not present

## 2015-04-18 DIAGNOSIS — Z79899 Other long term (current) drug therapy: Secondary | ICD-10-CM | POA: Insufficient documentation

## 2015-04-18 DIAGNOSIS — M109 Gout, unspecified: Secondary | ICD-10-CM | POA: Diagnosis present

## 2015-04-18 DIAGNOSIS — I10 Essential (primary) hypertension: Secondary | ICD-10-CM | POA: Diagnosis not present

## 2015-04-18 DIAGNOSIS — M25562 Pain in left knee: Secondary | ICD-10-CM | POA: Diagnosis not present

## 2015-04-18 DIAGNOSIS — Z794 Long term (current) use of insulin: Secondary | ICD-10-CM | POA: Insufficient documentation

## 2015-04-18 DIAGNOSIS — E119 Type 2 diabetes mellitus without complications: Secondary | ICD-10-CM | POA: Insufficient documentation

## 2015-04-18 DIAGNOSIS — S0990XA Unspecified injury of head, initial encounter: Secondary | ICD-10-CM | POA: Diagnosis not present

## 2015-04-18 DIAGNOSIS — R1310 Dysphagia, unspecified: Secondary | ICD-10-CM

## 2015-04-18 DIAGNOSIS — E1065 Type 1 diabetes mellitus with hyperglycemia: Secondary | ICD-10-CM

## 2015-04-18 DIAGNOSIS — R51 Headache: Secondary | ICD-10-CM | POA: Diagnosis not present

## 2015-04-18 DIAGNOSIS — N179 Acute kidney failure, unspecified: Secondary | ICD-10-CM | POA: Diagnosis present

## 2015-04-18 DIAGNOSIS — R1319 Other dysphagia: Secondary | ICD-10-CM

## 2015-04-18 DIAGNOSIS — E782 Mixed hyperlipidemia: Secondary | ICD-10-CM | POA: Diagnosis present

## 2015-04-18 DIAGNOSIS — E785 Hyperlipidemia, unspecified: Secondary | ICD-10-CM | POA: Diagnosis present

## 2015-04-18 DIAGNOSIS — R0602 Shortness of breath: Secondary | ICD-10-CM | POA: Insufficient documentation

## 2015-04-18 LAB — CBC WITH DIFFERENTIAL/PLATELET
BASOS ABS: 0.1 10*3/uL (ref 0.0–0.1)
BASOS PCT: 1 %
EOS ABS: 0.3 10*3/uL (ref 0.0–0.7)
Eosinophils Relative: 2 %
HCT: 31.5 % — ABNORMAL LOW (ref 39.0–52.0)
HEMOGLOBIN: 10.1 g/dL — AB (ref 13.0–17.0)
Lymphocytes Relative: 26 %
Lymphs Abs: 3.3 10*3/uL (ref 0.7–4.0)
MCH: 27.4 pg (ref 26.0–34.0)
MCHC: 32.1 g/dL (ref 30.0–36.0)
MCV: 85.4 fL (ref 78.0–100.0)
MONO ABS: 1 10*3/uL (ref 0.1–1.0)
MONOS PCT: 8 %
NEUTROS PCT: 63 %
Neutro Abs: 8 10*3/uL — ABNORMAL HIGH (ref 1.7–7.7)
Platelets: 354 10*3/uL (ref 150–400)
RBC: 3.69 MIL/uL — ABNORMAL LOW (ref 4.22–5.81)
RDW: 14.2 % (ref 11.5–15.5)
WBC: 12.8 10*3/uL — ABNORMAL HIGH (ref 4.0–10.5)

## 2015-04-18 MED ORDER — ALBUTEROL SULFATE (2.5 MG/3ML) 0.083% IN NEBU
2.5000 mg | INHALATION_SOLUTION | Freq: Once | RESPIRATORY_TRACT | Status: AC
  Administered 2015-04-18: 2.5 mg via RESPIRATORY_TRACT
  Filled 2015-04-18: qty 3

## 2015-04-18 MED ORDER — ALBUTEROL SULFATE (2.5 MG/3ML) 0.083% IN NEBU: 5.0000 mg | INHALATION_SOLUTION | Freq: Once | RESPIRATORY_TRACT | Status: DC

## 2015-04-18 MED ORDER — IPRATROPIUM BROMIDE 0.02 % IN SOLN: 0.5000 mg | Freq: Once | RESPIRATORY_TRACT | Status: DC

## 2015-04-18 MED ORDER — IPRATROPIUM-ALBUTEROL 0.5-2.5 (3) MG/3ML IN SOLN
3.0000 mL | Freq: Once | RESPIRATORY_TRACT | Status: AC
  Administered 2015-04-18: 3 mL via RESPIRATORY_TRACT
  Filled 2015-04-18: qty 3

## 2015-04-18 NOTE — ED Notes (Signed)
Pt states he is feeling SOB, denies chest pain, pt denies N/V. Pt states when he is moving he becomes SOB.

## 2015-04-18 NOTE — ED Provider Notes (Signed)
CSN: [id number]   Arrival date & time 04/18/15 2135  History  By signing my name below, I, [name], attest that this documentation has been prepared under the direction and in the presence of  , MD at 2330. Electronically Signed: Britney [name], ED Scribe. 04/19/2015. 12:07 AM.  Chief Complaint  Patient presents with  . Shortness of Breath    HPI The history is provided by the patient. No language interpreter was used.   [name] is a 74 y.o. male with history of COPD, CAD, MI, HTN, HLD, DM who presents to the Emergency Department complaining of increased exertional SOB that is chronic but got worse today. Patient states his breathing has "been bad" for a long time. He states his pulmonologist told him he has COPD. Pt states that he is able to walk less than usual without getting short of breath.  He is now only able to walk less than 5 feet without getting very short of breath. He is no longer on oxygen at home because of an insurance issue and until recently he felt that he was functioning well without it. Pt does not have an inhaler or nebulizer machine at home but states that they have helped in the hospital in the past.  Associated symptoms include dry cough, wheezing, and chronic abdominal swelling/tightness with no recent change. Pt denies fever, chest pain, and LE swelling or pain. He is a former smoker.   PCP Dr M Simpson Pulmonologist Dr Hawkins  Past Medical History  Diagnosis Date  . GERD (gastroesophageal reflux disease)   . CAD (coronary artery disease)   . Obesity   . Hyperlipidemia   . Hypertension   . Chronic back pain   . Gout   . Chronic bronchitis (HCC)     "get it q yr"  . Psoriasis   . COPD (chronic obstructive pulmonary disease) (HCC)   . Myocardial infarction (HCC) 1999  . Pneumonia     "a few times; last time was ~ 06/2013"  . On home oxygen therapy     "2L; only at night" (08/30/2013)  . Diabetes mellitus, type 1   . Arthritis of  knee   . Arthritis     "left leg" (08/30/2013)    Past Surgical History  Procedure Laterality Date  . Appendectomy    . Colonoscopy  03/17/2012    Procedure: COLONOSCOPY;  Surgeon: Najeeb U Rehman, MD;  Location: [facility];  Service: Endoscopy;  Laterality: N/A;  830  . Cystoscopy with urethral dilatation N/A 11/26/2012    Procedure: CYSTOSCOPY WITH URETHRAL DILATATION;  Surgeon: John J Wrenn, MD;  Location: [facility] ORS;  Service: Urology;  Laterality: N/A;  . Coronary angioplasty with stent placement  1999- 2009-08/30/2013    "counting today's, I have 5 stents" (08/30/2013)  . Left heart catheterization with coronary angiogram N/A 08/30/2013    Procedure: LEFT HEART CATHETERIZATION WITH CORONARY ANGIOGRAM;  Surgeon: Mohan N Harwani, MD;  Location: [facility];  Service: Cardiovascular;  Laterality: N/A;  . Percutaneous coronary stent intervention (pci-s) Right 08/30/2013    Procedure: PERCUTANEOUS CORONARY STENT INTERVENTION (PCI-S);  Surgeon: Mohan N Harwani, MD;  Location: [facility]ATH [facility];  Service: Cardiovascular;  Laterality: Right;    Family History  Problem Relation Age of Onset  . Hypertension Mother   . Diabetes Mother   . Hypertension Father   . Diabetes Brother   . Arthritis      Family History   . Diabetes        family History   . Stroke Daughter     Social History  Substance Use Topics  . Smoking status: Former Smoker -- 0.50 packs/day for 33 years    Types: Cigarettes  . Smokeless tobacco: Never Used     Comment: "stopped smoking in the 1990's  . Alcohol Use: No   lives at home Lives with spouse  Review of Systems  Constitutional: Negative for fever.  Respiratory: Positive for cough, shortness of breath and wheezing.   Cardiovascular: Negative for chest pain and leg swelling.  Gastrointestinal:       Abdominal swelling  All other systems reviewed and are negative.  Home Medications   Prior to Admission medications   Medication Sig Start Date End Date Taking?  Authorizing Provider  acetaminophen (TYLENOL) 500 MG tablet Take 500 mg by mouth every 6 (six) hours as needed for moderate pain (left thigh).    Historical Provider, MD  albuterol (PROVENTIL HFA;VENTOLIN HFA) 108 (90 BASE) MCG/ACT inhaler Inhale 1-2 puffs into the lungs every 6 (six) hours as needed for wheezing or shortness of breath. 11/13/14   Nat Christen, MD  allopurinol (ZYLOPRIM) 300 MG tablet Take 300 mg by mouth daily. 05/02/13   Historical Provider, MD  amLODipine (NORVASC) 10 MG tablet Take 1 tablet (10 mg total) by mouth daily. 09/20/14   Fayrene Helper, MD  aspirin 81 MG tablet Take 81 mg by mouth daily.    Historical Provider, MD  blood glucose meter kit and supplies KIT Dispense based on patient and insurance preference. Use up to four times daily as directed. (FOR ICD-9 250.00, 250.01). 11/15/14   Virgel Manifold, MD  fenofibrate micronized (LOFIBRA) 67 MG capsule Take 1 capsule (67 mg total) by mouth daily before breakfast. Patient not taking: Reported on 02/27/2015 08/31/14   Fayrene Helper, MD  glucose blood (ACCU-CHEK AVIVA PLUS) test strip Use one strip to check glucose four times daily 12/28/14   Fayrene Helper, MD  insulin NPH-regular Human (NOVOLIN 70/30) (70-30) 100 UNIT/ML injection Inject 45 Units into the skin 2 (two) times daily with a meal. 11/15/14   Virgel Manifold, MD  Insulin Syringe-Needle U-100 (INSULIN SYRINGE 1CC/31GX5/16") 31G X 5/16" 1 ML MISC 60 Units by Does not apply route 2 (two) times daily. 02/19/15   Cassandria Anger, MD  levothyroxine (SYNTHROID, LEVOTHROID) 75 MCG tablet Take 75 mcg by mouth every morning.     Historical Provider, MD  lisinopril (PRINIVIL,ZESTRIL) 40 MG tablet Take 1 tablet (40 mg total) by mouth daily. 09/20/14   Fayrene Helper, MD  metFORMIN (GLUCOPHAGE) 500 MG tablet Take 1 tablet (500 mg total) by mouth 2 (two) times daily. 09/02/13   Charolette Forward, MD  metoprolol (LOPRESSOR) 50 MG tablet Take 1 tablet (50 mg total) by mouth 2  (two) times daily. 12/25/14   Fayrene Helper, MD  pravastatin (PRAVACHOL) 40 MG tablet Take 1 tablet (40 mg total) by mouth daily. 09/20/14   Fayrene Helper, MD  predniSONE (DELTASONE) 50 MG tablet 1 tablet for 5 days, one half tablet for 5 days Patient not taking: Reported on 02/27/2015 11/13/14   Nat Christen, MD  spironolactone (ALDACTONE) 25 MG tablet Take 1 tablet (25 mg total) by mouth daily. 12/25/14   Fayrene Helper, MD  ticagrelor (BRILINTA) 90 MG TABS tablet Take 90 mg by mouth 2 (two) times daily.    Historical Provider, MD    Allergies  Review of patient's allergies indicates no known allergies.  Triage Vitals: BP 126/59 mmHg  Pulse 76  Temp(Src) 98.3 F (36.8 C) (Oral)  Resp 22  Ht 5' 10" (1.778 m)  Wt 235 lb (106.595 kg)  BMI 33.72 kg/m2  SpO2 95%  Vital signs normal    Physical Exam  Constitutional: He is oriented to person, place, and time. He appears well-developed and well-nourished.  Non-toxic appearance. He does not appear ill. No distress.  HENT:  Head: Normocephalic and atraumatic.  Right Ear: External ear normal.  Left Ear: External ear normal.  Nose: Nose normal. No mucosal edema or rhinorrhea.  Mouth/Throat: Oropharynx is clear and moist and mucous membranes are normal. No dental abscesses or uvula swelling.  Eyes: Conjunctivae and EOM are normal. Pupils are equal, round, and reactive to light.  Neck: Normal range of motion and full passive range of motion without pain. Neck supple.  Cardiovascular: Normal rate, regular rhythm and normal heart sounds.  Exam reveals no gallop and no friction rub.   No murmur heard. Pulmonary/Chest: Effort normal. No respiratory distress. He has wheezes (Scattered). He has no rhonchi. He has no rales. He exhibits no tenderness and no crepitus.  Diminished breath sounds  Abdominal: Soft. Normal appearance and bowel sounds are normal. He exhibits no distension. There is no tenderness. There is no rebound and no guarding.   Musculoskeletal: Normal range of motion. He exhibits edema. He exhibits no tenderness.  Moves all extremities well.  Trace pitting edema up top the knees.  Neurological: He is alert and oriented to person, place, and time. He has normal strength. No cranial nerve deficit.  Skin: Skin is warm, dry and intact. No rash noted. No erythema. No pallor.  Psychiatric: He has a normal mood and affect. His speech is normal and behavior is normal. His mood appears not anxious.  Nursing note and vitals reviewed.   ED Course  Procedures   Medications  ipratropium-albuterol (DUONEB) 0.5-2.5 (3) MG/3ML nebulizer solution 3 mL (3 mLs Nebulization Given 04/18/15 2339)  albuterol (PROVENTIL) (2.5 MG/3ML) 0.083% nebulizer solution 2.5 mg (2.5 mg Nebulization Given 04/18/15 2338)  albuterol (PROVENTIL,VENTOLIN) solution continuous neb (10 mg/hr Nebulization Given 04/19/15 0039)  ipratropium (ATROVENT) nebulizer solution 0.5 mg (0.5 mg Nebulization Given 04/19/15 0039)  predniSONE (DELTASONE) tablet 60 mg (60 mg Oral Given 04/19/15 0200)  albuterol (PROVENTIL) (2.5 MG/3ML) 0.083% nebulizer solution 5 mg (5 mg Nebulization Given 04/19/15 0252)  ipratropium (ATROVENT) nebulizer solution 0.5 mg (0.5 mg Nebulization Given 04/19/15 0252)    DIAGNOSTIC STUDIES: Oxygen Saturation is 95% on 2L, adequate by my interpretation.    COORDINATION OF CARE: 11:23 PM Discussed treatment plan which includes lab work, CXR, EKG, and a breathing treatment with pt at bedside and pt agreed to plan.  12:06 AM I re-evaluated the patient after his breathing treatment and provided an update on the results of his CXR and lab work. He has improved air movement but more diffuse rhonchi especially towards the bases bilaterally. A continuous nebulizer was ordered.  Recheck at 1:40 AM patient has finished his continuous nebulizer. He sitting on the edge of the stretcher. He states he's feeling much improved. When I listen to him he has  improved air movement without wheezing or rhonchi. We will do a trial of ambulation to see how he does. He was given prednisone 60 mg orally.  02:40 patient was ambulated by nursing staff. They report his pulse ox was 92% when ambulating. Patient reports he was doing well until he got back to the room  and then he started feeling more short of breath. He also states he choked twice when drinking water. She states he feels like he needs to stay in the hospital. Another nebulizer was ordered.  02:57 Dr H Jenkins, hospitalist, admit to med-surg    Labs Reviewed   Results for orders placed or performed during the hospital encounter of 04/18/15  Comprehensive metabolic panel  Result Value Ref Range   Sodium 137 135 - 145 mmol/L   Potassium 4.0 3.5 - 5.1 mmol/L   Chloride 102 101 - 111 mmol/L   CO2 25 22 - 32 mmol/L   Glucose, Bld 87 65 - 99 mg/dL   BUN 23 (H) 6 - 20 mg/dL   Creatinine, Ser 1.25 (H) 0.61 - 1.24 mg/dL   Calcium 9.1 8.9 - 10.3 mg/dL   Total Protein 7.4 6.5 - 8.1 g/dL   Albumin 3.4 (L) 3.5 - 5.0 g/dL   AST 15 15 - 41 U/L   ALT 9 (L) 17 - 63 U/L   Alkaline Phosphatase 68 38 - 126 U/L   Total Bilirubin 0.4 0.3 - 1.2 mg/dL   GFR calc non Af Amer 55 (L) >60 mL/min   GFR calc Af Amer >60 >60 mL/min   Anion gap 10 5 - 15  Brain natriuretic peptide  Result Value Ref Range   B Natriuretic Peptide 22.0 0.0 - 100.0 pg/mL  Troponin I  Result Value Ref Range   Troponin I <0.03 <0.031 ng/mL  CBC with Differential  Result Value Ref Range   WBC 12.8 (H) 4.0 - 10.5 K/uL   RBC 3.69 (L) 4.22 - 5.81 MIL/uL   Hemoglobin 10.1 (L) 13.0 - 17.0 g/dL   HCT 31.5 (L) 39.0 - 52.0 %   MCV 85.4 78.0 - 100.0 fL   MCH 27.4 26.0 - 34.0 pg   MCHC 32.1 30.0 - 36.0 g/dL   RDW 14.2 11.5 - 15.5 %   Platelets 354 150 - 400 K/uL   Neutrophils Relative % 63 %   Neutro Abs 8.0 (H) 1.7 - 7.7 K/uL   Lymphocytes Relative 26 %   Lymphs Abs 3.3 0.7 - 4.0 K/uL   Monocytes Relative 8 %   Monocytes Absolute  1.0 0.1 - 1.0 K/uL   Eosinophils Relative 2 %   Eosinophils Absolute 0.3 0.0 - 0.7 K/uL   Basophils Relative 1 %   Basophils Absolute 0.1 0.0 - 0.1 K/uL   Laboratory interpretation all normal except stable anemia, mild renal insufficiency which he has had in the past however his last urine creatinine in my system is normal     Imaging Review Dg Chest 2 View  04/18/2015  CLINICAL DATA:  74-year-old male with worsening shortness of breath. EXAM: CHEST  2 VIEW COMPARISON:  Radiograph dated 11/13/2014 FINDINGS: The heart size and mediastinal contours are within normal limits. Both lungs are clear. The visualized skeletal structures are unremarkable. IMPRESSION: No active cardiopulmonary disease. Electronically Signed   By: Arash  Radparvar M.D.   On: 04/18/2015 22:25    I personally reviewed and evaluated these images and lab results as a part of my medical decision-making.   EKG Interpretation  Date/Time:  Wednesday April 18 2015 21:47:59 EST Ventricular Rate:  77 PR Interval:  216 QRS Duration: 86 QT Interval:  383 QTC Calculation: 433 R Axis:   -20 Text Interpretation:  Sinus rhythm Atrial premature complex Borderline prolonged PR interval Borderline left axis deviation Abnormal R-wave progression, early transition No significant change since   last tracing Confirmed by Raeann Offner  MD-J, JON [phone]) on 04/18/2015 9:52:45 PM    MDM   Final diagnoses:  COPD exacerbation ([place])    Plan admission   CRITICAL CARE Performed by: Rolland Porter L Total critical care time: 35 minutes Critical care time was exclusive of separately billable procedures and treating other patients. Critical care was necessary to treat or prevent imminent or life-threatening deterioration. Critical care was time spent personally by me on the following activities: development of treatment plan with patient and/or surrogate as well as nursing, discussions with consultants, evaluation of patient's response to  treatment, examination of patient, obtaining history from patient or surrogate, ordering and performing treatments and interventions, ordering and review of laboratory studies, ordering and review of radiographic studies, pulse oximetry and re-evaluation of patient's condition.    I personally performed the services described in this documentation, which was scribed in my presence. The recorded information has been reviewed and considered.  Rolland Porter, MD, Barbette Or, MD 04/19/15 [phone]

## 2015-04-19 ENCOUNTER — Inpatient Hospital Stay (HOSPITAL_COMMUNITY): Payer: Commercial Managed Care - HMO

## 2015-04-19 ENCOUNTER — Other Ambulatory Visit: Payer: Self-pay

## 2015-04-19 DIAGNOSIS — Z794 Long term (current) use of insulin: Secondary | ICD-10-CM | POA: Diagnosis not present

## 2015-04-19 DIAGNOSIS — D649 Anemia, unspecified: Secondary | ICD-10-CM

## 2015-04-19 DIAGNOSIS — Z87891 Personal history of nicotine dependence: Secondary | ICD-10-CM | POA: Diagnosis not present

## 2015-04-19 DIAGNOSIS — E1065 Type 1 diabetes mellitus with hyperglycemia: Secondary | ICD-10-CM

## 2015-04-19 DIAGNOSIS — E784 Other hyperlipidemia: Secondary | ICD-10-CM | POA: Diagnosis not present

## 2015-04-19 DIAGNOSIS — J441 Chronic obstructive pulmonary disease with (acute) exacerbation: Secondary | ICD-10-CM

## 2015-04-19 DIAGNOSIS — Z7982 Long term (current) use of aspirin: Secondary | ICD-10-CM | POA: Diagnosis not present

## 2015-04-19 DIAGNOSIS — N179 Acute kidney failure, unspecified: Secondary | ICD-10-CM | POA: Diagnosis not present

## 2015-04-19 DIAGNOSIS — I10 Essential (primary) hypertension: Secondary | ICD-10-CM

## 2015-04-19 DIAGNOSIS — Z79899 Other long term (current) drug therapy: Secondary | ICD-10-CM | POA: Diagnosis not present

## 2015-04-19 DIAGNOSIS — E785 Hyperlipidemia, unspecified: Secondary | ICD-10-CM

## 2015-04-19 DIAGNOSIS — M109 Gout, unspecified: Secondary | ICD-10-CM

## 2015-04-19 DIAGNOSIS — R1319 Other dysphagia: Secondary | ICD-10-CM | POA: Diagnosis not present

## 2015-04-19 DIAGNOSIS — E108 Type 1 diabetes mellitus with unspecified complications: Secondary | ICD-10-CM

## 2015-04-19 DIAGNOSIS — R1310 Dysphagia, unspecified: Secondary | ICD-10-CM | POA: Diagnosis not present

## 2015-04-19 DIAGNOSIS — R0602 Shortness of breath: Secondary | ICD-10-CM | POA: Diagnosis not present

## 2015-04-19 DIAGNOSIS — E119 Type 2 diabetes mellitus without complications: Secondary | ICD-10-CM | POA: Diagnosis not present

## 2015-04-19 DIAGNOSIS — I2510 Atherosclerotic heart disease of native coronary artery without angina pectoris: Secondary | ICD-10-CM

## 2015-04-19 HISTORY — DX: Anemia, unspecified: D64.9

## 2015-04-19 LAB — IRON AND TIBC
IRON: 23 ug/dL — AB (ref 45–182)
SATURATION RATIOS: 8 % — AB (ref 17.9–39.5)
TIBC: 277 ug/dL (ref 250–450)
UIBC: 254 ug/dL

## 2015-04-19 LAB — GLUCOSE, CAPILLARY
GLUCOSE-CAPILLARY: 178 mg/dL — AB (ref 65–99)
GLUCOSE-CAPILLARY: 223 mg/dL — AB (ref 65–99)
Glucose-Capillary: 304 mg/dL — ABNORMAL HIGH (ref 65–99)
Glucose-Capillary: 216 mg/dL — ABNORMAL HIGH (ref 65–99)

## 2015-04-19 LAB — BASIC METABOLIC PANEL
ANION GAP: 7 (ref 5–15)
BUN: 27 mg/dL — ABNORMAL HIGH (ref 6–20)
CALCIUM: 8.7 mg/dL — AB (ref 8.9–10.3)
CO2: 25 mmol/L (ref 22–32)
Chloride: 104 mmol/L (ref 101–111)
Creatinine, Ser: 1.32 mg/dL — ABNORMAL HIGH (ref 0.61–1.24)
GFR, EST NON AFRICAN AMERICAN: 52 mL/min — AB (ref >60)
GLUCOSE: 157 mg/dL — AB (ref 65–99)
POTASSIUM: 4.4 mmol/L (ref 3.5–5.1)
Sodium: 136 mmol/L (ref 135–145)

## 2015-04-19 LAB — COMPREHENSIVE METABOLIC PANEL
ALBUMIN: 3.4 g/dL — AB (ref 3.5–5.0)
ALT: 9 U/L — ABNORMAL LOW (ref 17–63)
ANION GAP: 10 (ref 5–15)
AST: 15 U/L (ref 15–41)
Alkaline Phosphatase: 68 U/L (ref 38–126)
BILIRUBIN TOTAL: 0.4 mg/dL (ref 0.3–1.2)
BUN: 23 mg/dL — ABNORMAL HIGH (ref 6–20)
CO2: 25 mmol/L (ref 22–32)
Calcium: 9.1 mg/dL (ref 8.9–10.3)
Chloride: 102 mmol/L (ref 101–111)
Creatinine, Ser: 1.25 mg/dL — ABNORMAL HIGH (ref 0.61–1.24)
GFR calc Af Amer: >60 mL/min (ref >60)
GFR, EST NON AFRICAN AMERICAN: 55 mL/min — AB (ref >60)
Glucose, Bld: 87 mg/dL (ref 65–99)
POTASSIUM: 4 mmol/L (ref 3.5–5.1)
Sodium: 137 mmol/L (ref 135–145)
Total Protein: 7.4 g/dL (ref 6.5–8.1)

## 2015-04-19 LAB — CBC
HEMATOCRIT: 30 % — AB (ref 39.0–52.0)
HEMOGLOBIN: 9.5 g/dL — AB (ref 13.0–17.0)
MCH: 26.8 pg (ref 26.0–34.0)
MCHC: 31.7 g/dL (ref 30.0–36.0)
MCV: 84.7 fL (ref 78.0–100.0)
Platelets: 301 10*3/uL (ref 150–400)
RBC: 3.54 MIL/uL — AB (ref 4.22–5.81)
RDW: 14.1 % (ref 11.5–15.5)
WBC: 11.6 10*3/uL — ABNORMAL HIGH (ref 4.0–10.5)

## 2015-04-19 LAB — BRAIN NATRIURETIC PEPTIDE: B NATRIURETIC PEPTIDE 5: 22 pg/mL (ref 0.0–100.0)

## 2015-04-19 LAB — TROPONIN I: Troponin I: <0.03 ng/mL (ref <0.031)

## 2015-04-19 LAB — FOLATE: FOLATE: 6.8 ng/mL (ref >5.9)

## 2015-04-19 LAB — RETICULOCYTES
RBC.: 3.54 MIL/uL — ABNORMAL LOW (ref 4.22–5.81)
RETIC COUNT ABSOLUTE: 56.6 10*3/uL (ref 19.0–186.0)
Retic Ct Pct: 1.6 % (ref 0.4–3.1)

## 2015-04-19 LAB — FERRITIN: Ferritin: 130 ng/mL (ref 24–336)

## 2015-04-19 LAB — VITAMIN B12: VITAMIN B 12: 169 pg/mL — AB (ref 180–914)

## 2015-04-19 MED ORDER — ALLOPURINOL 300 MG PO TABS
300.0000 mg | ORAL_TABLET | Freq: Every day | ORAL | Status: DC
  Administered 2015-04-19 – 2015-04-20 (×2): 300 mg via ORAL
  Filled 2015-04-19 (×2): qty 1

## 2015-04-19 MED ORDER — ONDANSETRON HCL 4 MG PO TABS: 4.0000 mg | ORAL_TABLET | Freq: Four times a day (QID) | ORAL | Status: DC | PRN

## 2015-04-19 MED ORDER — SODIUM CHLORIDE 0.9 % IV SOLN
INTRAVENOUS | Status: DC
  Administered 2015-04-19: 06:00:00 via INTRAVENOUS

## 2015-04-19 MED ORDER — PREDNISONE 50 MG PO TABS
60.0000 mg | ORAL_TABLET | Freq: Once | ORAL | Status: AC
  Administered 2015-04-19: 60 mg via ORAL
  Filled 2015-04-19: qty 1

## 2015-04-19 MED ORDER — ENOXAPARIN SODIUM 30 MG/0.3ML ~~LOC~~ SOLN: 30.0000 mg | SUBCUTANEOUS | Status: DC

## 2015-04-19 MED ORDER — LEVOTHYROXINE SODIUM 75 MCG PO TABS
75.0000 ug | ORAL_TABLET | Freq: Every day | ORAL | Status: DC
  Administered 2015-04-19 – 2015-04-20 (×2): 75 ug via ORAL
  Filled 2015-04-19 (×2): qty 1

## 2015-04-19 MED ORDER — INSULIN ASPART PROT & ASPART (70-30 MIX) 100 UNIT/ML ~~LOC~~ SUSP
60.0000 [IU] | Freq: Two times a day (BID) | SUBCUTANEOUS | Status: DC
Start: 2015-04-19 — End: 2015-04-20
  Administered 2015-04-19 – 2015-04-20 (×3): 60 [IU] via SUBCUTANEOUS
  Filled 2015-04-19: qty 10

## 2015-04-19 MED ORDER — ACETAMINOPHEN 650 MG RE SUPP: 650.0000 mg | Freq: Four times a day (QID) | RECTAL | Status: DC | PRN

## 2015-04-19 MED ORDER — IPRATROPIUM-ALBUTEROL 0.5-2.5 (3) MG/3ML IN SOLN
3.0000 mL | RESPIRATORY_TRACT | Status: DC
  Administered 2015-04-19: 3 mL via RESPIRATORY_TRACT
  Filled 2015-04-19: qty 3

## 2015-04-19 MED ORDER — SODIUM CHLORIDE 0.9 % IJ SOLN
3.0000 mL | Freq: Two times a day (BID) | INTRAMUSCULAR | Status: DC
  Administered 2015-04-19 – 2015-04-20 (×2): 3 mL via INTRAVENOUS

## 2015-04-19 MED ORDER — HYDROMORPHONE HCL 1 MG/ML IJ SOLN: 0.5000 mg | INTRAMUSCULAR | Status: DC | PRN

## 2015-04-19 MED ORDER — SPIRONOLACTONE 25 MG PO TABS
25.0000 mg | ORAL_TABLET | Freq: Every day | ORAL | Status: DC
  Administered 2015-04-19 – 2015-04-20 (×2): 25 mg via ORAL
  Filled 2015-04-19 (×2): qty 1

## 2015-04-19 MED ORDER — OXYCODONE HCL 5 MG PO TABS: 5.0000 mg | ORAL_TABLET | ORAL | Status: DC | PRN

## 2015-04-19 MED ORDER — ALUM & MAG HYDROXIDE-SIMETH 200-200-20 MG/5ML PO SUSP: 30.0000 mL | Freq: Four times a day (QID) | ORAL | Status: DC | PRN

## 2015-04-19 MED ORDER — ALBUTEROL (5 MG/ML) CONTINUOUS INHALATION SOLN
10.0000 mg/h | INHALATION_SOLUTION | Freq: Once | RESPIRATORY_TRACT | Status: AC
  Administered 2015-04-19: 10 mg/h via RESPIRATORY_TRACT
  Filled 2015-04-19: qty 20

## 2015-04-19 MED ORDER — IPRATROPIUM BROMIDE 0.02 % IN SOLN
0.5000 mg | Freq: Once | RESPIRATORY_TRACT | Status: AC
  Administered 2015-04-19: 0.5 mg via RESPIRATORY_TRACT
  Filled 2015-04-19: qty 2.5

## 2015-04-19 MED ORDER — SODIUM CHLORIDE 0.9 % IV SOLN: 250.0000 mL | INTRAVENOUS | Status: DC | PRN

## 2015-04-19 MED ORDER — TICAGRELOR 90 MG PO TABS
90.0000 mg | ORAL_TABLET | Freq: Two times a day (BID) | ORAL | Status: DC
  Administered 2015-04-19 – 2015-04-20 (×3): 90 mg via ORAL
  Filled 2015-04-19 (×9): qty 1

## 2015-04-19 MED ORDER — SODIUM CHLORIDE 0.9 % IJ SOLN: 3.0000 mL | INTRAMUSCULAR | Status: DC | PRN

## 2015-04-19 MED ORDER — ONDANSETRON HCL 4 MG/2ML IJ SOLN: 4.0000 mg | Freq: Four times a day (QID) | INTRAMUSCULAR | Status: DC | PRN

## 2015-04-19 MED ORDER — ALBUTEROL SULFATE (2.5 MG/3ML) 0.083% IN NEBU
5.0000 mg | INHALATION_SOLUTION | Freq: Once | RESPIRATORY_TRACT | Status: AC
  Administered 2015-04-19: 5 mg via RESPIRATORY_TRACT
  Filled 2015-04-19: qty 6

## 2015-04-19 MED ORDER — ASPIRIN EC 81 MG PO TBEC
81.0000 mg | DELAYED_RELEASE_TABLET | Freq: Every day | ORAL | Status: DC
  Administered 2015-04-19 – 2015-04-20 (×2): 81 mg via ORAL
  Filled 2015-04-19 (×2): qty 1

## 2015-04-19 MED ORDER — ACETAMINOPHEN 325 MG PO TABS: 650.0000 mg | ORAL_TABLET | Freq: Four times a day (QID) | ORAL | Status: DC | PRN

## 2015-04-19 MED ORDER — ENOXAPARIN SODIUM 40 MG/0.4ML ~~LOC~~ SOLN
40.0000 mg | SUBCUTANEOUS | Status: DC
  Administered 2015-04-19 – 2015-04-20 (×2): 40 mg via SUBCUTANEOUS
  Filled 2015-04-19 (×2): qty 0.4

## 2015-04-19 MED ORDER — INSULIN ASPART 100 UNIT/ML ~~LOC~~ SOLN
0.0000 [IU] | Freq: Every day | SUBCUTANEOUS | Status: DC
  Administered 2015-04-19: 2 [IU] via SUBCUTANEOUS

## 2015-04-19 MED ORDER — ASPIRIN 81 MG PO TABS
81.0000 mg | ORAL_TABLET | Freq: Every day | ORAL | Status: DC
  Filled 2015-04-19 (×2): qty 1

## 2015-04-19 MED ORDER — INSULIN ASPART 100 UNIT/ML ~~LOC~~ SOLN
0.0000 [IU] | Freq: Three times a day (TID) | SUBCUTANEOUS | Status: DC
  Administered 2015-04-19: 2 [IU] via SUBCUTANEOUS
  Administered 2015-04-19: 3 [IU] via SUBCUTANEOUS
  Administered 2015-04-19: 7 [IU] via SUBCUTANEOUS
  Administered 2015-04-20: 2 [IU] via SUBCUTANEOUS

## 2015-04-19 MED ORDER — PRAVASTATIN SODIUM 40 MG PO TABS
40.0000 mg | ORAL_TABLET | Freq: Every day | ORAL | Status: DC
  Administered 2015-04-19 – 2015-04-20 (×2): 40 mg via ORAL
  Filled 2015-04-19 (×2): qty 1

## 2015-04-19 MED ORDER — METHYLPREDNISOLONE SODIUM SUCC 125 MG IJ SOLR
125.0000 mg | Freq: Once | INTRAMUSCULAR | Status: AC
Start: 2015-04-19 — End: 2015-04-19
  Administered 2015-04-19: 125 mg via INTRAVENOUS
  Filled 2015-04-19: qty 2

## 2015-04-19 MED ORDER — METOPROLOL TARTRATE 50 MG PO TABS
50.0000 mg | ORAL_TABLET | Freq: Two times a day (BID) | ORAL | Status: DC
  Administered 2015-04-19 – 2015-04-20 (×3): 50 mg via ORAL
  Filled 2015-04-19 (×3): qty 1

## 2015-04-19 MED ORDER — IPRATROPIUM-ALBUTEROL 0.5-2.5 (3) MG/3ML IN SOLN
3.0000 mL | Freq: Four times a day (QID) | RESPIRATORY_TRACT | Status: DC
  Administered 2015-04-19 – 2015-04-20 (×4): 3 mL via RESPIRATORY_TRACT
  Filled 2015-04-19 (×4): qty 3

## 2015-04-19 MED ORDER — ALBUTEROL SULFATE (2.5 MG/3ML) 0.083% IN NEBU: 5.0000 mg | INHALATION_SOLUTION | RESPIRATORY_TRACT | Status: AC | PRN

## 2015-04-19 NOTE — Care Management Note (Signed)
Case Management Note  Patient Details  Name: [name] MRN: [id number] Date of Birth: April 21, 1941  Subjective/Objective:                  Pt admitted from home with COPD exacerbation. Pt lives with his wife and will return home at discharge. Pt has a walker and cane for home use. Pt is fairly independent with ADL's.  Action/Plan: Pt will need home O2 assessment prior to discharge. Will continue to follow for discharge planning needs.  Expected Discharge Date:                  Expected Discharge Plan:  Home/Self Care  In-House Referral:  NA  Discharge planning Services  CM Consult  Post Acute Care Choice:  Durable Medical Equipment Choice offered to:  Patient  DME Arranged:    DME Agency:     HH Arranged:    [place] Agency:     Status of Service:  In process, will continue to follow  Medicare Important Message Given:    Date Medicare IM Given:    Medicare IM give by:    Date Additional Medicare IM Given:    Additional Medicare Important Message give by:     If discussed at [place] of Stay Meetings, dates discussed:    Additional Comments:  Joylene Draft, RN 04/19/2015, 2:08 PM

## 2015-04-19 NOTE — Telephone Encounter (Signed)
Closed encounter °

## 2015-04-19 NOTE — Patient Outreach (Addendum)
[facility]) Care Management  04/19/2015  [name] March 31, 1941 [id number]   Patient noted to be admitted to hospital. Message sent to hospital liaison for follow up while hospitalized.    Plan: RN Health Coach will send letter to primary doctor to notify of health coach signing off and that community nurse will follow once discharged home.  Jone Baseman, RN, MSN [place] [phone]

## 2015-04-19 NOTE — Consult Note (Signed)
   [facility] Inpatient Consult   04/19/2015  KEELAND TALKINGTON 1940-06-15 [id number]   Patient is currently active [prior to admission] with [facility] for disease management. Patient has been engaged by a [place]. Consent is current for [name] Care Management.  Patient will receive a post discharge transition of care follow up form [facility] CM community RN and will be evaluated for monthly home visits for assessments and disease process education, pending her disposition from the hospital. Made Inpatient Case Manager aware that [facility] following.  Of note, [facility] Care Management services does not replace or interfere with any services that are needed or arranged by inpatient case management or social work.  For additional questions or referrals please contact: Royetta Crochet. Laymond Purser, RN, BSN, [place] [facility] Liaison [phone]

## 2015-04-19 NOTE — Progress Notes (Signed)
**Note De-Identified  Obfuscation** Patients family is concerned that oxygen is needed for home use.

## 2015-04-19 NOTE — Progress Notes (Signed)
Patient briefly seen and examined, chart reviewed. Admitted earlier today for shortness of breath and dysphagia. Found to have a COPD exacerbation. He is already improving, less wheezing on exam. Continues to complain of dysphagia. Barium swallow pending. Will request speech therapy evaluation. May require GI consult based on results. We'll continue to follow.  Domingo Mend, MD [facility] Pager: [phone]

## 2015-04-19 NOTE — Evaluation (Addendum)
Clinical/Bedside Swallow Evaluation Patient Details  Name: [name] MRN: [id number] Date of Birth: [birth year]  Today's Date: 04/19/2015 Time: SLP Start Time (ACUTE ONLY): 1920 SLP Stop Time (ACUTE ONLY): 2002 SLP Time Calculation (min) (ACUTE ONLY): 42 min  Past Medical History:  Past Medical History  Diagnosis Date  . GERD (gastroesophageal reflux disease)   . CAD (coronary artery disease)   . Obesity   . Hyperlipidemia   . Hypertension   . Chronic back pain   . Gout   . Chronic bronchitis ([place])     "get it q yr"  . Psoriasis   . COPD (chronic obstructive pulmonary disease) ([place])   . Myocardial infarction ([place]) 1999  . Pneumonia     "a few times; last time was ~ 06/2013"  . On home oxygen therapy     "2L; only at night" (08/30/2013)  . Diabetes mellitus, type 1   . Arthritis of knee   . Arthritis     "left leg" (08/30/2013)   Past Surgical History:  Past Surgical History  Procedure Laterality Date  . Appendectomy    . Colonoscopy  03/17/2012    Procedure: COLONOSCOPY;  Surgeon: Rogene Houston, MD;  [facility];  Service: Endoscopy;  Laterality: N/A;  830  . Cystoscopy with urethral dilatation N/A 11/26/2012    Procedure: CYSTOSCOPY WITH URETHRAL DILATATION;  Surgeon: Malka So, MD;  Location: [facility] ORS;  Service: Urology;  Laterality: N/A;  . Coronary angioplasty with stent placement  1999- 2009-08/30/2013    "counting today's, I have 5 stents" (08/30/2013)  . Left heart catheterization with coronary angiogram N/A 08/30/2013    Procedure: LEFT HEART CATHETERIZATION WITH CORONARY ANGIOGRAM;  Surgeon: Clent Demark, MD;  Location: [facility] CATH LAB;  Service: Cardiovascular;  Laterality: N/A;  . Percutaneous coronary stent intervention (pci-s) Right 08/30/2013    Procedure: PERCUTANEOUS CORONARY STENT INTERVENTION (PCI-S);  Surgeon: Clent Demark, MD;  Location: [facility] CATH LAB;  Service: Cardiovascular;  Laterality: Right;   HPI:  Mr. [name] is a 74 yo  male who was admitted with SOB. He has a history of COPD, CAD S/P PCI with Stents, HTN, DM2 who presents to the ED with complaints of worsening SOB and DOE x 2 days. He denies having chest pain but reports having chest tightness and wheezing. He denies any fevers or chills. He also reports having bouts of choking spells when he eats and states it has been going on for months. In the ED, he was found to mild hypoxia to 92%and was placed on NCO2 of 3.5 liters initially. He was administered nebulizer treatments and 60 mg of Prednisone and began to slowly improve. Chest x-ray showed: No active cardiopulmonary disease. Esophagram was ordered and completed this afternoon which showed: Laryngeal penetration and aspiration of contrast with an additional episode of prolonged strong coughing when swallowing water with the barium tablet likely representing additional nonvisualized aspiration. SLP asked to evaluate swallow.   Assessment / Plan / Recommendation Clinical Impression  Clinical swallow evaluation completed after thorough chart review. Pt/wife report several year history of coughing during meals. Wife also reports that pt coughs when exposed to temperature extremes (hot car, cold day). Pt reports history of COPD and is followed by Dr. Luan Pulling ("he said I ruined one lung from smoking"). Esophagram completed today did show aspiration of contrast. Unfortunately, that test uses a barium that is not a true "thin liquid" so SLP unable to fully use those results for  interpretation of pharyngeal swallow. Pt assessed at bedside this evening with thin liquids, applesauce, and crackers. Pt with strong coughing episode midway through graham crackers (did well with saltines). SLP suspects that pt bothered by dry/flaky pieces in setting of what sounds like fairly severe COPD (per pt report). SLP explained to pt and his wife the relationship between COPD and its effect on coordination or respiration and swallow. Suspect that  pt's symptoms can be managed by slight diet modification and implementation of strategies to avoid thickening liquids which are as follows: Pt to sit fully upright for all eating/drinking, take small bites/sips, avoid use of straw and use cup sip, go slowly, avoid talking during meals, exercise caution with mixed consistencies, and masticate solids well (pt has upper dentures and likely has decreased sensory feedback). Also recommend MBSS tomorrow if SLP able to complete- if unable, this can be completed as an outpatient to provide objective view of oropharyngeal swallow with use of strategies as needed. Pt and wife in agreement with plan of care. Will downgrade textures to D3/mechanical soft and thin liquids. Recommendations placed in room.    Aspiration Risk  Mild aspiration risk    Diet Recommendation Dysphagia 3 (Mech soft);Thin liquid   Liquid Administration via: No straw;Cup Medication Administration: Whole meds with puree Supervision: Patient able to self feed;Intermittent supervision to cue for compensatory strategies Compensations: Slow rate;Small sips/bites;Multiple dry swallows after each bite/sip Postural Changes: Seated upright at 90 degrees;Remain upright for at least 30 minutes after po intake    Other  Recommendations Oral Care Recommendations: Oral care BID;Patient independent with oral care Other Recommendations: Clarify dietary restrictions   Follow up Recommendations  None    Frequency and Duration  2x         Prognosis Prognosis for Safe Diet Advancement: Good Barriers/Prognosis Comment: COPD severity      Swallow Study   General Date of Onset: 04/18/15 HPI: [name] is a 74 yo male who was admitted with SOB. He has a history of COPD, CAD S/P PCI with Stents, HTN, DM2 who presents to the ED with complaints of worsening SOB and DOE x 2 days. He denies having chest pain but reports having chest tightness and wheezing. He denies any fevers or chills. He also  reports having bouts of choking spells when he eats and states it has been going on for months. In the ED, he was found to mild hypoxia to 92%and was placed on NCO2 of 3.5 liters initially. He was administered nebulizer treatments and 60 mg of Prednisone and began to slowly improve. Chest x-ray showed: No active cardiopulmonary disease. Esophagram was ordered and completed this afternoon which showed: Laryngeal penetration and aspiration of contrast with an additional episode of prolonged strong coughing when swallowing water with the barium tablet likely representing additional nonvisualized aspiration. SLP asked to evaluate swallow. Type of Study: Bedside Swallow Evaluation Previous Swallow Assessment: barium swallow completed this afternoon which showed aspiration of contrast Diet Prior to this Study: Regular;Thin liquids Temperature Spikes Noted: No Respiratory Status: Nasal cannula History of Recent Intubation: No Behavior/Cognition: Alert;Cooperative;Pleasant mood Oral Cavity Assessment: Within Functional Limits Oral Care Completed by SLP: No Oral Cavity - Dentition: Dentures, top Vision: Functional for self-feeding Self-Feeding Abilities: Able to feed self Patient Positioning: Upright in bed Baseline Vocal Quality: Normal (can hear mild wheeze with breathing) Volitional Cough: Strong Volitional Swallow: Able to elicit    Oral/Motor/Sensory Function Overall Oral Motor/Sensory Function: Within functional limits   Ice Chips Ice  chips: Within functional limits   Thin Liquid Thin Liquid: Within functional limits Presentation: Cup;Self Fed;Straw    Nectar Thick Nectar Thick Liquid: Not tested   Honey Thick Honey Thick Liquid: Not tested   Puree Puree: Within functional limits Presentation: Self Fed;Spoon   Solid Solid: Impaired Presentation: Self Fed;Spoon Pharyngeal Phase Impairments: Cough - Delayed;Cough - Immediate      Thank you,  Genene Churn,  [place]  PORTER,DABNEY May 02, 2015,8:21 PM  G-Codes Swallowing; clinical judgment Initial: CJ Goal: CJ Discharge: CJ

## 2015-04-19 NOTE — Progress Notes (Signed)
SATURATION QUALIFICATIONS: (This note is used to comply with regulatory documentation for home oxygen)  Patient Saturations on Room Air at Rest = 96%  Patient Saturations on Room Air while Ambulating = 93%  

## 2015-04-19 NOTE — H&P (Addendum)
[facility] Admission History and Physical       [name] OIT:[id number] DOB: 11-30-1940 DOA: 04/18/2015  Referring physician: EDP PCP: Tula Nakayama, MD  Specialists:   Chief Complaint: SOB  HPI: [name] is a 74 y.o. male with a history of COPD, CAD S/P PCI with Stents, HTN, DM2 who presents to the ED with complaints of worsening SOB and DOE x 2 days.  He denies  having chest pain but reports having chest tightness and wheezing.   He denies any fevers or chills.   He also reports having bouts of choking spells when he eats and states it has been going on for months.    In the ED, he was found to mild hypoxia to 92%and was placed on NCO2 of 3.5 liters initially. He was administered nebulizer treatments and 60 mg of Prednisone and began to slowly improve.  He was referred for admission.      Review of Systems:  Constitutional: No Weight Loss, No Weight Gain, Night Sweats, Fevers, Chills, Dizziness, Light Headedness, Fatigue, or Generalized Weakness HEENT: No Headaches, +Difficulty Swallowing,Tooth/Dental Problems,Sore Throat,  No Sneezing, Rhinitis, Ear Ache, Nasal Congestion, or Post Nasal Drip,  Cardio-vascular:  No Chest pain, Orthopnea, PND, Edema in Lower Extremities, Anasarca, Dizziness, Palpitations  Resp:   +Dyspnea, +DOE, No Productive Cough, No Non-Productive Cough, No Hemoptysis, +Wheezing.    GI: No Heartburn, Indigestion, Abdominal Pain, Nausea, Vomiting, Diarrhea, Constipation, Hematemesis, Hematochezia, Melena, Change in Bowel Habits,  Loss of Appetite  GU: No Dysuria, No Change in Color of Urine, No Urgency or Urinary Frequency, No Flank pain.  Musculoskeletal: No Joint Pain or Swelling, No Decreased Range of Motion, No Back Pain.  Neurologic: No Syncope, No Seizures, Muscle Weakness, Paresthesia, Vision Disturbance or Loss, No Diplopia, No Vertigo, No Difficulty Walking,  Skin: No Rash or Lesions. Psych: No Change in Mood or Affect, No  Depression or Anxiety, No Memory loss, No Confusion, or Hallucinations   Past Medical History  Diagnosis Date  . GERD (gastroesophageal reflux disease)   . CAD (coronary artery disease)   . Obesity   . Hyperlipidemia   . Hypertension   . Chronic back pain   . Gout   . Chronic bronchitis ([place])     "get it q yr"  . Psoriasis   . COPD (chronic obstructive pulmonary disease) ([place])   . Myocardial infarction ([place]) 1999  . Pneumonia     "a few times; last time was ~ 06/2013"  . On home oxygen therapy     "2L; only at night" (08/30/2013)  . Diabetes mellitus, type 1   . Arthritis of knee   . Arthritis     "left leg" (08/30/2013)     Past Surgical History  Procedure Laterality Date  . Appendectomy    . Colonoscopy  03/17/2012    Procedure: COLONOSCOPY;  Surgeon: Rogene Houston, MD;  [facility];  Service: Endoscopy;  Laterality: N/A;  830  . Cystoscopy with urethral dilatation N/A 11/26/2012    Procedure: CYSTOSCOPY WITH URETHRAL DILATATION;  Surgeon: Malka So, MD;  Location: [facility] ORS;  Service: Urology;  Laterality: N/A;  . Coronary angioplasty with stent placement  1999- 2009-08/30/2013    "counting today's, I have 5 stents" (08/30/2013)  . Left heart catheterization with coronary angiogram N/A 08/30/2013    Procedure: LEFT HEART CATHETERIZATION WITH CORONARY ANGIOGRAM;  Surgeon: Clent Demark, MD;  Location: [facility] CATH LAB;  Service: Cardiovascular;  Laterality: N/A;  .  Percutaneous coronary stent intervention (pci-s) Right 08/30/2013    Procedure: PERCUTANEOUS CORONARY STENT INTERVENTION (PCI-S);  Surgeon: Clent Demark, MD;  Location: [facility] CATH LAB;  Service: Cardiovascular;  Laterality: Right;      Prior to Admission medications   Medication Sig Start Date End Date Taking? Authorizing Provider  acetaminophen (TYLENOL) 500 MG tablet Take 500 mg by mouth every 6 (six) hours as needed for moderate pain (left thigh).    Historical Provider, MD  albuterol (PROVENTIL  HFA;VENTOLIN HFA) 108 (90 BASE) MCG/ACT inhaler Inhale 1-2 puffs into the lungs every 6 (six) hours as needed for wheezing or shortness of breath. 11/13/14   Nat Christen, MD  allopurinol (ZYLOPRIM) 300 MG tablet Take 300 mg by mouth daily. 05/02/13   Historical Provider, MD  amLODipine (NORVASC) 10 MG tablet Take 1 tablet (10 mg total) by mouth daily. 09/20/14   Fayrene Helper, MD  aspirin 81 MG tablet Take 81 mg by mouth daily.    Historical Provider, MD  blood glucose meter kit and supplies KIT Dispense based on patient and insurance preference. Use up to four times daily as directed. (FOR ICD-9 250.00, 250.01). 11/15/14   Virgel Manifold, MD  fenofibrate micronized (LOFIBRA) 67 MG capsule Take 1 capsule (67 mg total) by mouth daily before breakfast. Patient not taking: Reported on 02/27/2015 08/31/14   Fayrene Helper, MD  glucose blood (ACCU-CHEK AVIVA PLUS) test strip Use one strip to check glucose four times daily 12/28/14   Fayrene Helper, MD  insulin NPH-regular Human (NOVOLIN 70/30) (70-30) 100 UNIT/ML injection Inject 45 Units into the skin 2 (two) times daily with a meal. 11/15/14   Virgel Manifold, MD  Insulin Syringe-Needle U-100 (INSULIN SYRINGE 1CC/31GX5/16") 31G X 5/16" 1 ML MISC 60 Units by Does not apply route 2 (two) times daily. 02/19/15   Cassandria Anger, MD  levothyroxine (SYNTHROID, LEVOTHROID) 75 MCG tablet Take 75 mcg by mouth every morning.     Historical Provider, MD  lisinopril (PRINIVIL,ZESTRIL) 40 MG tablet Take 1 tablet (40 mg total) by mouth daily. 09/20/14   Fayrene Helper, MD  metFORMIN (GLUCOPHAGE) 500 MG tablet Take 1 tablet (500 mg total) by mouth 2 (two) times daily. 09/02/13   Charolette Forward, MD  metoprolol (LOPRESSOR) 50 MG tablet Take 1 tablet (50 mg total) by mouth 2 (two) times daily. 12/25/14   Fayrene Helper, MD  pravastatin (PRAVACHOL) 40 MG tablet Take 1 tablet (40 mg total) by mouth daily. 09/20/14   Fayrene Helper, MD  predniSONE (DELTASONE) 50  MG tablet 1 tablet for 5 days, one half tablet for 5 days Patient not taking: Reported on 02/27/2015 11/13/14   Nat Christen, MD  spironolactone (ALDACTONE) 25 MG tablet Take 1 tablet (25 mg total) by mouth daily. 12/25/14   Fayrene Helper, MD  ticagrelor (BRILINTA) 90 MG TABS tablet Take 90 mg by mouth 2 (two) times daily.    Historical Provider, MD     No Known Allergies    Social History:  Married, Former Smoker  reports that he has quit smoking. His smoking use included Cigarettes. He has a 16.5 pack-year smoking history. He has never used smokeless tobacco. He reports that he does not drink alcohol or use illicit drugs.    Family History  Problem Relation Age of Onset  . Hypertension Mother   . Diabetes Mother   . Hypertension Father   . Diabetes Brother   . Arthritis  Family History   . Diabetes      family History   . Stroke Daughter        Physical Exam:  GEN:  Pleasant Obese  74 y.o. African American male examined and in no acute distress; cooperative with exam Filed Vitals:   04/19/15 0245 04/19/15 0254 04/19/15 0300 04/19/15 0330  BP:   118/66 121/52  Pulse: 79  79 80  Temp:      TempSrc:      Resp:      Height:      Weight:      SpO2: 96% 95% 93% 95%   Blood pressure 121/52, pulse 80, temperature 98.3 F (36.8 C), temperature source Oral, resp. rate 24, height $RemoveBe'5\' 10"'kHVDbCcTV$  (1.778 m), weight 106.595 kg (235 lb), SpO2 95 %. PSYCH: He is alert and oriented x4; does not appear anxious does not appear depressed; affect is normal HEENT: Normocephalic and Atraumatic, Mucous membranes pink; PERRLA; EOM intact; Fundi:  Benign;  No scleral icterus, Nares: Patent, Oropharynx: Clear, Edentulous Upper palate with Upper Denture Present, Lower mandible with sparse Dentition;     Neck:  FROM, No Cervical Lymphadenopathy nor Thyromegaly or Carotid Bruit; No JVD; Breasts:: Not examined CHEST WALL: No tenderness CHEST:  Decreased Breath Sounds, No Rales, Rhonchi,  +  Wheezes HEART: Regular rate and rhythm; no murmurs rubs or gallops BACK: No kyphosis or scoliosis; No CVA tenderness ABDOMEN: Positive Bowel Sounds, Obese, Soft Non-Tender, No Rebound or Guarding; No Masses, No Organomegaly Rectal Exam: Not done EXTREMITIES: No Cyanosis, Clubbing, or Edema; No Ulcerations. Genitalia: not examined PULSES: 2+ and symmetric SKIN: Normal hydration no rash or ulceration CNS:  Alert and Oriented x 4, No Focal Deficits Vascular: pulses palpable throughout    Labs on Admission:  Basic Metabolic Panel:  Recent Labs Lab 04/18/15 2200  NA 137  K 4.0  CL 102  CO2 25  GLUCOSE 87  BUN 23*  CREATININE 1.25*  CALCIUM 9.1   Liver Function Tests:  Recent Labs Lab 04/18/15 2200  AST 15  ALT 9*  ALKPHOS 68  BILITOT 0.4  PROT 7.4  ALBUMIN 3.4*   No results for input(s): LIPASE, AMYLASE in the last 168 hours. No results for input(s): AMMONIA in the last 168 hours. CBC:  Recent Labs Lab 04/18/15 2200  WBC 12.8*  NEUTROABS 8.0*  HGB 10.1*  HCT 31.5*  MCV 85.4  PLT 354   Cardiac Enzymes:  Recent Labs Lab 04/18/15 2200  TROPONINI <0.03    BNP (last 3 results)  Recent Labs  04/18/15 2200  BNP 22.0    ProBNP (last 3 results) No results for input(s): PROBNP in the last 8760 hours.  CBG: No results for input(s): GLUCAP in the last 168 hours.  Radiological Exams on Admission: Dg Chest 2 View  04/18/2015  CLINICAL DATA:  74 year old male with worsening shortness of breath. EXAM: CHEST  2 VIEW COMPARISON:  Radiograph dated 11/13/2014 FINDINGS: The heart size and mediastinal contours are within normal limits. Both lungs are clear. The visualized skeletal structures are unremarkable. IMPRESSION: No active cardiopulmonary disease. Electronically Signed   By: Anner Crete M.D.   On: 04/18/2015 22:25     EKG: Independently reviewed. Normal Sinus Rhythm Rate = 77, No Acute Changes    Assessment/Plan:      74 y.o. male with   Principal Problem:   1.     COPD exacerbation (HCC)   Duonebs   Steroid taper   O2 PRN  Monitor O2 sats   Active Problems:   2.    Dysphagia   Esophagram ordered in AM   May Need GI referral      3.    Coronary atherosclerosis   Continue Brilinta, ASA, and Metoprolol Rx      4.    Diabetes mellitus, insulin dependent (IDDM), uncontrolled (HCC)   Continue Insulin 70/30  50 Units SQ BID ( reduced by 10 Units)     Hold if Glucose < 100   Hold Metformin Rx   SSI coverage PRN   Check HbA1C in AM     4.    Hyperlipidemia with target LDL less than 70   Continue Pravasatin Rx     5.    Essential hypertension   On Amlodipine, adn Metoprolol, and Lisinopril Rx   Holding Lisinopril Rx due to AKI     6.    Gout   Continue Allopurinol Rx      7.    Anemia   Anemia Panel ordered   Check FOBT   Monitor Trend     8.    AKI (acute kidney injury) ([place])   Hold Lisinopril Rx   Gentle IVFs   Monitor BUN/Cr     9.    DVT Prophylaxis   Lovenox    Code Status:     FULL CODE        Family Communication:    Wife at Bedside      Disposition Plan:    Inpatient Status        Time spent:  94 Minutes      Theressa Millard [facility] Pager [phone]   If [place] Please Contact the Day Rounding Team MD for [facility]  If 7PM-7AM, Please Contact Night-Floor Coverage  [email or web address] Password [email or web address] 04/19/2015, 3:46 AM     ADDENDUM:   Patient was seen and examined on 04/19/2015

## 2015-04-20 ENCOUNTER — Encounter (HOSPITAL_COMMUNITY): Payer: Self-pay

## 2015-04-20 ENCOUNTER — Emergency Department (HOSPITAL_COMMUNITY)
Admission: EM | Admit: 2015-04-20 | Discharge: 2015-04-20 | Disposition: A | Discharge status: Home / Self Care | Payer: Commercial Managed Care - HMO | Attending: Emergency Medicine | Admitting: Emergency Medicine

## 2015-04-20 ENCOUNTER — Emergency Department (HOSPITAL_COMMUNITY): Payer: Commercial Managed Care - HMO

## 2015-04-20 DIAGNOSIS — Z7982 Long term (current) use of aspirin: Secondary | ICD-10-CM | POA: Diagnosis not present

## 2015-04-20 DIAGNOSIS — Z9981 Dependence on supplemental oxygen: Secondary | ICD-10-CM | POA: Insufficient documentation

## 2015-04-20 DIAGNOSIS — S00512A Abrasion of oral cavity, initial encounter: Secondary | ICD-10-CM | POA: Diagnosis not present

## 2015-04-20 DIAGNOSIS — W01198A Fall on same level from slipping, tripping and stumbling with subsequent striking against other object, initial encounter: Secondary | ICD-10-CM | POA: Diagnosis not present

## 2015-04-20 DIAGNOSIS — J441 Chronic obstructive pulmonary disease with (acute) exacerbation: Secondary | ICD-10-CM | POA: Insufficient documentation

## 2015-04-20 DIAGNOSIS — Z9889 Other specified postprocedural states: Secondary | ICD-10-CM | POA: Diagnosis not present

## 2015-04-20 DIAGNOSIS — S199XXA Unspecified injury of neck, initial encounter: Secondary | ICD-10-CM | POA: Diagnosis not present

## 2015-04-20 DIAGNOSIS — R51 Headache: Secondary | ICD-10-CM | POA: Diagnosis not present

## 2015-04-20 DIAGNOSIS — M109 Gout, unspecified: Secondary | ICD-10-CM | POA: Diagnosis not present

## 2015-04-20 DIAGNOSIS — M542 Cervicalgia: Secondary | ICD-10-CM | POA: Diagnosis not present

## 2015-04-20 DIAGNOSIS — S8992XA Unspecified injury of left lower leg, initial encounter: Secondary | ICD-10-CM | POA: Insufficient documentation

## 2015-04-20 DIAGNOSIS — Y998 Other external cause status: Secondary | ICD-10-CM | POA: Insufficient documentation

## 2015-04-20 DIAGNOSIS — Y9289 Other specified places as the place of occurrence of the external cause: Secondary | ICD-10-CM | POA: Diagnosis not present

## 2015-04-20 DIAGNOSIS — R55 Syncope and collapse: Secondary | ICD-10-CM | POA: Diagnosis present

## 2015-04-20 DIAGNOSIS — Y9389 Activity, other specified: Secondary | ICD-10-CM | POA: Insufficient documentation

## 2015-04-20 DIAGNOSIS — M1990 Unspecified osteoarthritis, unspecified site: Secondary | ICD-10-CM | POA: Insufficient documentation

## 2015-04-20 DIAGNOSIS — E669 Obesity, unspecified: Secondary | ICD-10-CM | POA: Diagnosis not present

## 2015-04-20 DIAGNOSIS — Z872 Personal history of diseases of the skin and subcutaneous tissue: Secondary | ICD-10-CM | POA: Diagnosis not present

## 2015-04-20 DIAGNOSIS — I2510 Atherosclerotic heart disease of native coronary artery without angina pectoris: Secondary | ICD-10-CM | POA: Diagnosis not present

## 2015-04-20 DIAGNOSIS — Z87891 Personal history of nicotine dependence: Secondary | ICD-10-CM | POA: Diagnosis not present

## 2015-04-20 DIAGNOSIS — Z8719 Personal history of other diseases of the digestive system: Secondary | ICD-10-CM | POA: Insufficient documentation

## 2015-04-20 DIAGNOSIS — G8929 Other chronic pain: Secondary | ICD-10-CM | POA: Insufficient documentation

## 2015-04-20 DIAGNOSIS — S0990XA Unspecified injury of head, initial encounter: Secondary | ICD-10-CM | POA: Diagnosis not present

## 2015-04-20 DIAGNOSIS — I10 Essential (primary) hypertension: Secondary | ICD-10-CM | POA: Insufficient documentation

## 2015-04-20 DIAGNOSIS — R0602 Shortness of breath: Secondary | ICD-10-CM | POA: Diagnosis not present

## 2015-04-20 DIAGNOSIS — Z8701 Personal history of pneumonia (recurrent): Secondary | ICD-10-CM | POA: Insufficient documentation

## 2015-04-20 DIAGNOSIS — I252 Old myocardial infarction: Secondary | ICD-10-CM | POA: Insufficient documentation

## 2015-04-20 DIAGNOSIS — M25562 Pain in left knee: Secondary | ICD-10-CM | POA: Diagnosis not present

## 2015-04-20 DIAGNOSIS — E785 Hyperlipidemia, unspecified: Secondary | ICD-10-CM | POA: Insufficient documentation

## 2015-04-20 DIAGNOSIS — E784 Other hyperlipidemia: Secondary | ICD-10-CM | POA: Diagnosis not present

## 2015-04-20 DIAGNOSIS — E119 Type 2 diabetes mellitus without complications: Secondary | ICD-10-CM | POA: Insufficient documentation

## 2015-04-20 DIAGNOSIS — Z794 Long term (current) use of insulin: Secondary | ICD-10-CM | POA: Diagnosis not present

## 2015-04-20 DIAGNOSIS — Z79899 Other long term (current) drug therapy: Secondary | ICD-10-CM | POA: Diagnosis not present

## 2015-04-20 LAB — CBC WITH DIFFERENTIAL/PLATELET
BASOS PCT: 0 %
Basophils Absolute: 0 10*3/uL (ref 0.0–0.1)
EOS ABS: 0 10*3/uL (ref 0.0–0.7)
Eosinophils Relative: 0 %
HCT: 32.3 % — ABNORMAL LOW (ref 39.0–52.0)
Hemoglobin: 10.5 g/dL — ABNORMAL LOW (ref 13.0–17.0)
Lymphocytes Relative: 11 %
Lymphs Abs: 2.4 10*3/uL (ref 0.7–4.0)
MCH: 27.5 pg (ref 26.0–34.0)
MCHC: 32.5 g/dL (ref 30.0–36.0)
MCV: 84.6 fL (ref 78.0–100.0)
MONO ABS: 1.3 10*3/uL — AB (ref 0.1–1.0)
MONOS PCT: 6 %
NEUTROS PCT: 83 %
Neutro Abs: 17.5 10*3/uL — ABNORMAL HIGH (ref 1.7–7.7)
PLATELETS: 361 10*3/uL (ref 150–400)
RBC: 3.82 MIL/uL — ABNORMAL LOW (ref 4.22–5.81)
RDW: 14.4 % (ref 11.5–15.5)
WBC: 21.3 10*3/uL — ABNORMAL HIGH (ref 4.0–10.5)

## 2015-04-20 LAB — COMPREHENSIVE METABOLIC PANEL
ALT: 12 U/L — ABNORMAL LOW (ref 17–63)
AST: 21 U/L (ref 15–41)
Albumin: 3.5 g/dL (ref 3.5–5.0)
Alkaline Phosphatase: 67 U/L (ref 38–126)
Anion gap: 10 (ref 5–15)
BILIRUBIN TOTAL: 0.1 mg/dL — AB (ref 0.3–1.2)
BUN: 34 mg/dL — AB (ref 6–20)
CO2: 26 mmol/L (ref 22–32)
Calcium: 9.2 mg/dL (ref 8.9–10.3)
Chloride: 100 mmol/L — ABNORMAL LOW (ref 101–111)
Creatinine, Ser: 1.18 mg/dL (ref 0.61–1.24)
GFR calc Af Amer: >60 mL/min (ref >60)
GFR, EST NON AFRICAN AMERICAN: 59 mL/min — AB (ref >60)
Glucose, Bld: 156 mg/dL — ABNORMAL HIGH (ref 65–99)
POTASSIUM: 4.5 mmol/L (ref 3.5–5.1)
Sodium: 136 mmol/L (ref 135–145)
TOTAL PROTEIN: 7.7 g/dL (ref 6.5–8.1)

## 2015-04-20 LAB — URINALYSIS, ROUTINE W REFLEX MICROSCOPIC
BILIRUBIN URINE: NEGATIVE
GLUCOSE, UA: NEGATIVE mg/dL
HGB URINE DIPSTICK: NEGATIVE
KETONES UR: NEGATIVE mg/dL
LEUKOCYTES UA: NEGATIVE
Nitrite: NEGATIVE
PH: 6 (ref 5.0–8.0)
Protein, ur: NEGATIVE mg/dL
Specific Gravity, Urine: 1.01 (ref 1.005–1.030)

## 2015-04-20 LAB — GLUCOSE, CAPILLARY
GLUCOSE-CAPILLARY: 158 mg/dL — AB (ref 65–99)
GLUCOSE-CAPILLARY: 100 mg/dL — AB (ref 65–99)

## 2015-04-20 LAB — TROPONIN I

## 2015-04-20 LAB — LACTIC ACID, PLASMA: Lactic Acid, Venous: 1.9 mmol/L (ref 0.5–2.0)

## 2015-04-20 MED ORDER — INSULIN NPH ISOPHANE & REGULAR (70-30) 100 UNIT/ML ~~LOC~~ SUSP: 60.0000 [IU] | Freq: Two times a day (BID) | SUBCUTANEOUS | Status: DC

## 2015-04-20 MED ORDER — SODIUM CHLORIDE 0.9 % IV BOLUS (SEPSIS)
1000.0000 mL | Freq: Once | INTRAVENOUS | Status: AC
  Administered 2015-04-20: 1000 mL via INTRAVENOUS

## 2015-04-20 NOTE — Care Management Obs Status (Signed)
[place] NOTIFICATION   Patient Details  Name: CYREE HILINSKI MRN: [id number] Date of Birth: November 23, 1940   Medicare Observation Status Notification Given:  Yes    Joylene Draft, RN 04/20/2015, 12:21 PM

## 2015-04-20 NOTE — ED Provider Notes (Signed)
CSN: [id number]     Arrival date & time 04/20/15  2006 History  By signing my name below, I, Arianna Nassar, attest that this documentation has been prepared under the direction and in the presence of Merrily Pew, MD. Electronically Signed: Julien Nordmann, ED Scribe. 04/20/2015. 9:27 PM.    Chief Complaint  Patient presents with  . Loss of Consciousness      The history is provided by the patient and the spouse.   HPI Comments: [name] is a 74 y.o. male who has a hx of MI, CAD, COPD presents to the Emergency Department complaining of moderate, sudden, gradual worsening loss of consciousness onset this evening.He only remembers sitting down in his recliner and woke up in the kitchen on the floor. Pt complains of baseline shortness of breath, head pain, left knee pain and has abrasions in his mouth from his dentures cracking when he fell.  Wife states she did not see the fall but she heard him. Pt was released from the hospital today after being admitted 11/30 for shortness of breath. Pt denies of seizures, chest pain, abdominal pain, light headedness, dizziness, and being on blood thinners.   Past Medical History  Diagnosis Date  . GERD (gastroesophageal reflux disease)   . CAD (coronary artery disease)   . Obesity   . Hyperlipidemia   . Hypertension   . Chronic back pain   . Gout   . Chronic bronchitis ([place])     "get it q yr"  . Psoriasis   . COPD (chronic obstructive pulmonary disease) ([place])   . Myocardial infarction ([place]) 1999  . Pneumonia     "a few times; last time was ~ 06/2013"  . On home oxygen therapy     "2L; only at night" (08/30/2013)  . Diabetes mellitus, type 1   . Arthritis of knee   . Arthritis     "left leg" (08/30/2013)   Past Surgical History  Procedure Laterality Date  . Appendectomy    . Colonoscopy  03/17/2012    Procedure: COLONOSCOPY;  Surgeon: Rogene Houston, MD;  Location: [facility] END[facility] SU[facility];  Service: Endoscopy;  Laterality: N/A;  830  .  Cystoscopy with urethral dilatation N/A 11/26/2012    Procedure: CYSTOSCOPY WITH URETHRAL DILATATION;  Surgeon: Malka So, MD;  Location: [facility] ORS;  Service: Urology;  Laterality: N/A;  . Coronary angioplasty with stent placement  1999- 2009-08/30/2013    "counting today's, I have 5 stents" (08/30/2013)  . Left heart catheterization with coronary angiogram N/A 08/30/2013    Procedure: LEFT HEART CATHETERIZATION WITH CORONARY ANGIOGRAM;  Surgeon: Clent Demark, MD;  Location: [facility] CATH LAB;  Service: Cardiovascular;  Laterality: N/A;  . Percutaneous coronary stent intervention (pci-s) Right 08/30/2013    Procedure: PERCUTANEOUS CORONARY STENT INTERVENTION (PCI-S);  Surgeon: Clent Demark, MD;  Location: [facility] CATH LAB;  Service: Cardiovascular;  Laterality: Right;   Family History  Problem Relation Age of Onset  . Hypertension Mother   . Diabetes Mother   . Hypertension Father   . Diabetes Brother   . Arthritis      Family History   . Diabetes      family History   . Stroke Daughter    Social History  Substance Use Topics  . Smoking status: Former Smoker -- 0.50 packs/day for 33 years    Types: Cigarettes  . Smokeless tobacco: Never Used     Comment: "stopped smoking in the 1990's  . Alcohol Use:  No    Review of Systems  Respiratory: Positive for shortness of breath.   Cardiovascular: Negative for chest pain.  Gastrointestinal: Negative for abdominal pain.  Neurological: Positive for headaches. Negative for light-headedness.  All other systems reviewed and are negative.     Allergies  Review of patient's allergies indicates no known allergies.  Home Medications   Prior to Admission medications   Medication Sig Start Date End Date Taking? Authorizing Provider  acetaminophen (TYLENOL) 500 MG tablet Take 500 mg by mouth every 6 (six) hours as needed for moderate pain (left thigh).    Historical Provider, MD  albuterol (PROVENTIL HFA;VENTOLIN HFA) 108 (90 BASE) MCG/ACT inhaler  Inhale 1-2 puffs into the lungs every 6 (six) hours as needed for wheezing or shortness of breath. 11/13/14   Nat Christen, MD  allopurinol (ZYLOPRIM) 300 MG tablet Take 300 mg by mouth daily. 05/02/13   Historical Provider, MD  amLODipine (NORVASC) 10 MG tablet Take 1 tablet (10 mg total) by mouth daily. 09/20/14   Fayrene Helper, MD  aspirin 81 MG tablet Take 81 mg by mouth daily.    Historical Provider, MD  blood glucose meter kit and supplies KIT Dispense based on patient and insurance preference. Use up to four times daily as directed. (FOR ICD-9 250.00, 250.01). 11/15/14   Virgel Manifold, MD  glucose blood (ACCU-CHEK AVIVA PLUS) test strip Use one strip to check glucose four times daily 12/28/14   Fayrene Helper, MD  insulin NPH-regular Human (NOVOLIN 70/30) (70-30) 100 UNIT/ML injection Inject 60 Units into the skin 2 (two) times daily with a meal. 04/20/15   Erline Hau, MD  Insulin Syringe-Needle U-100 (INSULIN SYRINGE 1CC/31GX5/16") 31G X 5/16" 1 ML MISC 60 Units by Does not apply route 2 (two) times daily. 02/19/15   Cassandria Anger, MD  levothyroxine (SYNTHROID, LEVOTHROID) 75 MCG tablet Take 75 mcg by mouth every morning.     Historical Provider, MD  lisinopril (PRINIVIL,ZESTRIL) 40 MG tablet Take 1 tablet (40 mg total) by mouth daily. 09/20/14   Fayrene Helper, MD  metFORMIN (GLUCOPHAGE) 500 MG tablet Take 1 tablet (500 mg total) by mouth 2 (two) times daily. 09/02/13   Charolette Forward, MD  metoprolol (LOPRESSOR) 50 MG tablet Take 1 tablet (50 mg total) by mouth 2 (two) times daily. 12/25/14   Fayrene Helper, MD  pravastatin (PRAVACHOL) 40 MG tablet Take 1 tablet (40 mg total) by mouth daily. 09/20/14   Fayrene Helper, MD  spironolactone (ALDACTONE) 25 MG tablet Take 1 tablet (25 mg total) by mouth daily. 12/25/14   Fayrene Helper, MD   Triage vitals: BP 141/66 mmHg  Pulse 80  Temp(Src) 98 F (36.7 C) (Oral)  Resp 13  Ht $R'5\' 10"'ro$  (1.778 m)  Wt 230 lb (104.327  kg)  BMI 33.00 kg/m2  SpO2 96% Physical Exam  Constitutional: He is oriented to person, place, and time. He appears well-developed and well-nourished.  HENT:  Head: Normocephalic and atraumatic.  Multiple abrasions to gumline  Eyes: EOM are normal.  Neck: Normal range of motion.  Cardiovascular: Normal rate, regular rhythm, normal heart sounds and intact distal pulses.   Pulmonary/Chest: Effort normal and breath sounds normal. No respiratory distress. He exhibits no tenderness.  Abdominal: Soft. He exhibits no distension. There is no tenderness.  Musculoskeletal: Normal range of motion.  Extremities non tender  Neurological: He is alert and oriented to person, place, and time.  Skin: Skin is warm and  dry.  Psychiatric: He has a normal mood and affect. Judgment normal.  Nursing note and vitals reviewed.   ED Course  Procedures  DIAGNOSTIC STUDIES: Oxygen Saturation is 96% on RA, adequate by my interpretation.  COORDINATION OF CARE:  9:27 PM Discussed treatment plan with pt at bedside and pt agreed to plan.  Labs Review Labs Reviewed  COMPREHENSIVE METABOLIC PANEL - Abnormal; Notable for the following:    Chloride 100 (*)    Glucose, Bld 156 (*)    BUN 34 (*)    ALT 12 (*)    Total Bilirubin 0.1 (*)    GFR calc non Af Amer 59 (*)    All other components within normal limits  CBC WITH DIFFERENTIAL/PLATELET - Abnormal; Notable for the following:    WBC 21.3 (*)    RBC 3.82 (*)    Hemoglobin 10.5 (*)    HCT 32.3 (*)    Neutro Abs 17.5 (*)    Monocytes Absolute 1.3 (*)    All other components within normal limits  URINALYSIS, ROUTINE W REFLEX MICROSCOPIC (NOT AT [facility])  TROPONIN I  LACTIC ACID, PLASMA    Imaging Review Ct Head Wo Contrast  04/20/2015  CLINICAL DATA:  [place] tonight. Amnestic for the event. Now with left-sided head and neck pain. EXAM: CT HEAD WITHOUT CONTRAST CT CERVICAL SPINE WITHOUT CONTRAST TECHNIQUE: Multidetector CT imaging of the head and cervical  spine was performed following the standard protocol without intravenous contrast. Multiplanar CT image reconstructions of the cervical spine were also generated. COMPARISON:  10/03/2009 FINDINGS: CT HEAD FINDINGS There is no intracranial hemorrhage or extra-axial fluid collection there is moderate generalized atrophy. There is remote lacunar infarction in the right thalamus and left subinsular region. Mild white matter hypodensity is consistent with chronic small vessel disease. No acute intracranial findings are evident, and there is no significant interval change. Calvarium and skullbase are intact. CT CERVICAL SPINE FINDINGS The vertebral column, pedicles and facet articulations are intact. There is no evidence of acute fracture. No acute soft tissue abnormalities are evident. Moderate degenerative disc changes are present from C3 through C6. IMPRESSION: 1. Negative for acute intracranial traumatic injury. Remote lacunar infarctions, mild chronic small vessel disease and moderate generalized atrophy are present. 2. Negative for acute cervical spine fracture. Electronically Signed   By: Andreas Newport M.D.   On: 04/20/2015 21:26   Ct Cervical Spine Wo Contrast  04/20/2015  CLINICAL DATA:  [place] tonight. Amnestic for the event. Now with left-sided head and neck pain. EXAM: CT HEAD WITHOUT CONTRAST CT CERVICAL SPINE WITHOUT CONTRAST TECHNIQUE: Multidetector CT imaging of the head and cervical spine was performed following the standard protocol without intravenous contrast. Multiplanar CT image reconstructions of the cervical spine were also generated. COMPARISON:  10/03/2009 FINDINGS: CT HEAD FINDINGS There is no intracranial hemorrhage or extra-axial fluid collection there is moderate generalized atrophy. There is remote lacunar infarction in the right thalamus and left subinsular region. Mild white matter hypodensity is consistent with chronic small vessel disease. No acute intracranial findings are evident,  and there is no significant interval change. Calvarium and skullbase are intact. CT CERVICAL SPINE FINDINGS The vertebral column, pedicles and facet articulations are intact. There is no evidence of acute fracture. No acute soft tissue abnormalities are evident. Moderate degenerative disc changes are present from C3 through C6. IMPRESSION: 1. Negative for acute intracranial traumatic injury. Remote lacunar infarctions, mild chronic small vessel disease and moderate generalized atrophy are present. 2. Negative for acute  cervical spine fracture. Electronically Signed   By: Andreas Newport M.D.   On: 04/20/2015 21:26   Dg Knee Complete 4 Views Left  04/20/2015  CLINICAL DATA:  Anterior knee pain.  Syncopal episode with fall. EXAM: LEFT KNEE - COMPLETE 4+ VIEW COMPARISON:  None. FINDINGS: The mineralization and alignment are normal. There is no evidence of acute fracture or dislocation. Minimal medial compartment joint space loss. Mild spurring at the quadriceps insertion on the patella. No significant joint effusion. There is atherosclerosis of the femoral and popliteal arteries. IMPRESSION: No acute osseous findings. Mild medial compartment degenerative changes. Electronically Signed   By: Richardean Sale M.D.   On: 04/20/2015 21:43   I have personally reviewed and evaluated these images and lab results as part of my medical decision-making.   EKG Interpretation   Date/Time:  Friday April 20 2015 20:22:43 EST Ventricular Rate:  71 PR Interval:  207 QRS Duration: 80 QT Interval:  386 QTC Calculation: 419 R Axis:   6 Text Interpretation:  Sinus rhythm Confirmed by Alinda Egolf MD, Corene Cornea [phone])  on 04/21/2015 11:42:57 PM      MDM   Final diagnoses:  Head trauma, initial encounter   74 year old male recently released the hospital secondary to COPD exacerbation presents to the emergency department today after a fall. Patient doesn't remember the situation. Here his neurologic exam is fine. He  walked fine. Has no dizziness or vertigo. EKG without evidence of prolonged QT, Parkinson White, [place] or ST elevations neck to be to his symptoms. [place] the patient syncopized and fell or fell first and has anterograde amnesia. I suspect he fell and hit his head and has amnesia secondary to that as otherwise noted expect that he would've remembered walking to the kitchen. After prolonged observation in the emergency department and negative imaging studies a feeling patient stable for discharge. If he is to fall again or have witnessed syncope would need reevaluation. Wife states that she'll watch him closely for the next few days and have him follow-up with his doctor next week as scheduled.  I personally performed the services described in this documentation, which was scribed in my presence. The recorded information has been reviewed and is accurate.     Merrily Pew, MD 04/21/15 [phone]

## 2015-04-20 NOTE — ED Notes (Signed)
Patient states he went to sit down in a chair and woke up in the floor. Patient does not remember event. Patient c/o pain to left side of head, upper gum pain and lower lip pain. Patient states he broke his dentures and it cut his mouth.

## 2015-04-20 NOTE — Progress Notes (Signed)
Pt's IV catheter removed and intact. Pt's IV site clean dry and intact. Discharge instructions, medications and follow up appointments reviewed and discussed with patient. Pt verbalized understanding of discharge instructions including medications. All questions were answered and no further questions at this time. Pt in stable condition and in no acute distress at this time. Pt will be escorted by nurse tech.

## 2015-04-20 NOTE — ED Notes (Signed)
[place] and hit her head, lost consciousness per family. Denies any vomiting. Alert and oriented at this time. Head hurting on the left side of forehead, has cuts in his mouth from breaking his upper denture plate, and left knee is hurt also. Remembers trying to make a sandwich and remembers sitting in a chair and woke up in the floor. He was able to get up out of the floor by himself. He was discharged from the hospital this morning for his COPD.

## 2015-04-20 NOTE — Discharge Instructions (Signed)
Soft diet. Sit up after eating. Follow each bite with a sip of water.

## 2015-04-20 NOTE — Discharge Summary (Signed)
Physician Discharge Summary  [name] JKK:[id number] DOB: 1941/02/08 DOA: 04/18/2015  PCP: Tula Nakayama, MD  Admit date: 04/18/2015 Discharge date: 04/20/2015  Time spent: 45 minutes  Recommendations for Outpatient Follow-up:  -Will be discharged home today. -Advised to follow-up with primary care provider in 2 weeks.   Discharge Diagnoses:  Principal Problem:   COPD exacerbation ([place]) Active Problems:   Diabetes mellitus, insulin dependent (IDDM), uncontrolled ([place])   Hyperlipidemia with target LDL less than 70   Gout   Essential hypertension   Coronary atherosclerosis   Anemia   AKI (acute kidney injury) [facility])   Discharge Condition: Stable and improved  Filed Weights   04/18/15 2148 04/19/15 0421  Weight: 106.595 kg (235 lb) 109.226 kg (240 lb 12.8 oz)    History of present illness:  As per Dr. Arnoldo Morale 04/19/15: [name] is a 74 y.o. male with a history of COPD, CAD S/P PCI with Stents, HTN, DM2 who presents to the ED with complaints of worsening SOB and DOE x 2 days. He denies having chest pain but reports having chest tightness and wheezing. He denies any fevers or chills. He also reports having bouts of choking spells when he eats and states it has been going on for months. In the ED, he was found to mild hypoxia to 92%and was placed on NCO2 of 3.5 liters initially. He was administered nebulizer treatments and 60 mg of Prednisone and began to slowly improve. He was referred for admission.   Hospital Course:   COPD with mild acute exacerbation -Quickly improved. -No current oxygen requirements. -Excellent air movement without wheezing on lung auscultation. -We'll not prescribe any steroids or antibiotics upon discharge.  Dysphagia -Evaluated by speech therapy with recommendations for a dysphagia 3 diet with thin liquids, to sit upright while eating and to follow each bite with a sip of water. -Esophagram showed laryngeal  penetration and aspiration of contrast with an additional episode of prolonged strong coughing when swallowing water with the barium tablet, likely representing additional non-visualized aspiration.  Procedures:  None   Consultations:  None  Discharge Instructions  Discharge Instructions    AMB Referral to [facility]    Complete by:  As directed   Please refer to [facility] RN for Transition of Care program Patient was active with [place] [facility]. Patient inpatient with COPD exacerbation Dx of COPD, DM 1 admit and 2 ED visits in past 6 months.  Please call with any questions or concerns. Royetta Crochet. Niemczura, RN, BSN, CCM  [facility] [phone]  Reason for consult:  [facility] Active  Diagnoses of:   COPD/ Pneumonia Diabetes    Expected date of contact:  1-3 days (reserved for hospital discharges)     Diet - low sodium heart healthy    Complete by:  As directed      Increase activity slowly    Complete by:  As directed             Medication List    TAKE these medications        acetaminophen 500 MG tablet  Commonly known as:  TYLENOL  Take 500 mg by mouth every 6 (six) hours as needed for moderate pain (left thigh).     albuterol 108 (90 BASE) MCG/ACT inhaler  Commonly known as:  PROVENTIL HFA;VENTOLIN HFA  Inhale 1-2 puffs into the lungs every 6 (six) hours as needed for wheezing or shortness of breath.  allopurinol 300 MG tablet  Commonly known as:  ZYLOPRIM  Take 300 mg by mouth daily.     amLODipine 10 MG tablet  Commonly known as:  NORVASC  Take 1 tablet (10 mg total) by mouth daily.     aspirin 81 MG tablet  Take 81 mg by mouth daily.     blood glucose meter kit and supplies Kit  Dispense based on patient and insurance preference. Use up to four times daily as directed. (FOR ICD-9 250.00, 250.01).     glucose blood test strip  Commonly known as:  ACCU-CHEK AVIVA PLUS  Use one strip to check glucose four times daily      insulin NPH-regular Human (70-30) 100 UNIT/ML injection  Commonly known as:  NOVOLIN 70/30  Inject 60 Units into the skin 2 (two) times daily with a meal.     INSULIN SYRINGE 1CC/31GX5/16" 31G X 5/16" 1 ML Misc  60 Units by Does not apply route 2 (two) times daily.     levothyroxine 75 MCG tablet  Commonly known as:  SYNTHROID, LEVOTHROID  Take 75 mcg by mouth every morning.     lisinopril 40 MG tablet  Commonly known as:  PRINIVIL,ZESTRIL  Take 1 tablet (40 mg total) by mouth daily.     metFORMIN 500 MG tablet  Commonly known as:  GLUCOPHAGE  Take 1 tablet (500 mg total) by mouth 2 (two) times daily.     metoprolol 50 MG tablet  Commonly known as:  LOPRESSOR  Take 1 tablet (50 mg total) by mouth 2 (two) times daily.     pravastatin 40 MG tablet  Commonly known as:  PRAVACHOL  Take 1 tablet (40 mg total) by mouth daily.     spironolactone 25 MG tablet  Commonly known as:  ALDACTONE  Take 1 tablet (25 mg total) by mouth daily.       No Known Allergies     Follow-up Information    Follow up with Tula Nakayama, MD. Schedule an appointment as soon as possible for a visit in 2 weeks.   Specialty:  Family Medicine   Contact information:   [address], [place] [address] [phone]        The results of significant diagnostics from this hospitalization (including imaging, microbiology, ancillary and laboratory) are listed below for reference.    Significant Diagnostic Studies: Dg Chest 2 View  04/18/2015  CLINICAL DATA:  74 year old male with worsening shortness of breath. EXAM: CHEST  2 VIEW COMPARISON:  Radiograph dated 11/13/2014 FINDINGS: The heart size and mediastinal contours are within normal limits. Both lungs are clear. The visualized skeletal structures are unremarkable. IMPRESSION: No active cardiopulmonary disease. Electronically Signed   By: Anner Crete M.D.   On: 04/18/2015 22:25   Dg Esophagus  04/19/2015  CLINICAL DATA:   Difficulty swallowing solid foods and large pills, coughing episodes with eating and drinking EXAM: ESOPHOGRAM / BARIUM SWALLOW / BARIUM TABLET STUDY TECHNIQUE: Combined double contrast and single contrast examination performed using effervescent crystals, thick barium liquid, and thin barium liquid. The patient was observed with fluoroscopy swallowing a 13 mm barium sulphate tablet. FLUOROSCOPY TIME:  Radiation Exposure Index (as provided by the fluoroscopic device): Not provided If the device does not provide the exposure index: Fluoroscopy Time:  1 minutes 42 seconds Number of Acquired Images: 16 plus multiple screen captures during fluoroscopy COMPARISON:  None FINDINGS: Normal esophageal distention. No esophageal mass or stricture. 12.5 mm diameter barium  tablet passed from oral cavity to stomach without delay. No persistent intraluminal filling defects. Smooth appearance of esophageal walls on air contrast imaging without irregularity or ulceration. No hiatal hernia. Diffuse age-related impairment of esophageal motility. Targeted rapid sequence imaging of the cervical esophagus and hypopharynx demonstrated laryngeal penetration with contrast extending to the vocal cords. A subtle amount of contrast is seen aspirated below the vocal cords along the anterior wall of the proximal trachea without identification of a spontaneous cough. When the patient swallowed the barium tablet with water, he experienced a marked coughing episode which persisted for 20-30 seconds, clinically suspect nonvisualized aspiration (water not fluoroscopically identifiable). Vallecular and piriform sinus residuals were noted. IMPRESSION: Laryngeal penetration and aspiration of contrast with an additional episode of prolonged strong coughing when swallowing water with the barium tablet likely representing additional nonvisualized aspiration. Electronically Signed   By: Lavonia Dana M.D.   On: 04/19/2015 16:14    Microbiology: No results  found for this or any previous visit (from the past 240 hour(s)).   Labs: Basic Metabolic Panel:  Recent Labs Lab 04/18/15 2200 04/19/15 0647  NA 137 136  K 4.0 4.4  CL 102 104  CO2 25 25  GLUCOSE 87 157*  BUN 23* 27*  CREATININE 1.25* 1.32*  CALCIUM 9.1 8.7*   Liver Function Tests:  Recent Labs Lab 04/18/15 2200  AST 15  ALT 9*  ALKPHOS 68  BILITOT 0.4  PROT 7.4  ALBUMIN 3.4*   No results for input(s): LIPASE, AMYLASE in the last 168 hours. No results for input(s): AMMONIA in the last 168 hours. CBC:  Recent Labs Lab 04/18/15 2200 04/19/15 0647  WBC 12.8* 11.6*  NEUTROABS 8.0*  --   HGB 10.1* 9.5*  HCT 31.5* 30.0*  MCV 85.4 84.7  PLT 354 301   Cardiac Enzymes:  Recent Labs Lab 04/18/15 2200  TROPONINI <0.03   BNP: BNP (last 3 results)  Recent Labs  04/18/15 2200  BNP 22.0    ProBNP (last 3 results) No results for input(s): PROBNP in the last 8760 hours.  CBG:  Recent Labs Lab 04/19/15 0743 04/19/15 1200 04/19/15 1613 04/19/15 2052 04/20/15 0808  GLUCAP 178* 304* 223* 216* 158*       Signed:  HERNANDEZ ACOSTA,ESTELA  [facility] Pager: [phone] 04/20/2015, 11:08 AM

## 2015-04-20 NOTE — Plan of Care (Signed)
Problem: Nutritional: Goal: Maintenance of adequate nutrition will improve Outcome: Not Met (add Reason) Speech pathologists completed assessment. Pt now on Dysphagia 3 Diet.  Problem: Respiratory: Goal: Ability to maintain adequate ventilation will improve Outcome: Progressing See flowsheet  Problem: Pain Management: Goal: Expressions of feelings of enhanced comfort will increase Outcome: Progressing Pt denies pain.     

## 2015-04-20 NOTE — Care Management Note (Signed)
Case Management Note  Patient Details  Name: [name] MRN: [id number] Date of Birth: 02/25/1941  Subjective/Objective:                    Action/Plan:   Expected Discharge Date:  04/20/15               Expected Discharge Plan:  Home/Self Care  In-House Referral:  NA  Discharge planning Services  CM Consult  Post Acute Care Choice:  NA Choice offered to:  NA  DME Arranged:    DME Agency:     HH Arranged:    [facility] Agency:     Status of Service:  Completed, signed off  Medicare Important Message Given:    Date Medicare IM Given:    Medicare IM give by:    Date Additional Medicare IM Given:    Additional Medicare Important Message give by:     If discussed at [place] of Stay Meetings, dates discussed:    Additional Comments: Pt discharged home today. No Cm needs noted. Pt does not qualify for home O2 at this time. Pt changed to OBS. CC 44 given. Christinia Gully Hoffman, RN 04/20/2015, 12:46 PM

## 2015-04-21 LAB — HEMOGLOBIN A1C
Hgb A1c MFr Bld: 7.4 % — ABNORMAL HIGH (ref 4.8–5.6)
MEAN PLASMA GLUCOSE: 166 mg/dL

## 2015-04-23 ENCOUNTER — Emergency Department (HOSPITAL_COMMUNITY): Payer: Commercial Managed Care - HMO

## 2015-04-23 ENCOUNTER — Encounter (HOSPITAL_COMMUNITY): Payer: Self-pay | Admitting: *Deleted

## 2015-04-23 ENCOUNTER — Emergency Department (HOSPITAL_COMMUNITY)
Admission: EM | Admit: 2015-04-23 | Discharge: 2015-04-23 | Disposition: A | Discharge status: Home / Self Care | Payer: Commercial Managed Care - HMO | Attending: Emergency Medicine | Admitting: Emergency Medicine

## 2015-04-23 DIAGNOSIS — Z7982 Long term (current) use of aspirin: Secondary | ICD-10-CM | POA: Diagnosis not present

## 2015-04-23 DIAGNOSIS — F32A Depression, unspecified: Secondary | ICD-10-CM

## 2015-04-23 DIAGNOSIS — Z794 Long term (current) use of insulin: Secondary | ICD-10-CM | POA: Insufficient documentation

## 2015-04-23 DIAGNOSIS — E785 Hyperlipidemia, unspecified: Secondary | ICD-10-CM | POA: Insufficient documentation

## 2015-04-23 DIAGNOSIS — I10 Essential (primary) hypertension: Secondary | ICD-10-CM | POA: Diagnosis not present

## 2015-04-23 DIAGNOSIS — Z79899 Other long term (current) drug therapy: Secondary | ICD-10-CM | POA: Insufficient documentation

## 2015-04-23 DIAGNOSIS — S0990XA Unspecified injury of head, initial encounter: Secondary | ICD-10-CM | POA: Diagnosis not present

## 2015-04-23 DIAGNOSIS — E669 Obesity, unspecified: Secondary | ICD-10-CM | POA: Insufficient documentation

## 2015-04-23 DIAGNOSIS — F329 Major depressive disorder, single episode, unspecified: Secondary | ICD-10-CM | POA: Diagnosis not present

## 2015-04-23 DIAGNOSIS — Z872 Personal history of diseases of the skin and subcutaneous tissue: Secondary | ICD-10-CM | POA: Diagnosis not present

## 2015-04-23 DIAGNOSIS — R0989 Other specified symptoms and signs involving the circulatory and respiratory systems: Secondary | ICD-10-CM | POA: Insufficient documentation

## 2015-04-23 DIAGNOSIS — J449 Chronic obstructive pulmonary disease, unspecified: Secondary | ICD-10-CM | POA: Diagnosis not present

## 2015-04-23 DIAGNOSIS — F0391 Unspecified dementia with behavioral disturbance: Secondary | ICD-10-CM | POA: Insufficient documentation

## 2015-04-23 DIAGNOSIS — Z8701 Personal history of pneumonia (recurrent): Secondary | ICD-10-CM | POA: Diagnosis not present

## 2015-04-23 DIAGNOSIS — I2510 Atherosclerotic heart disease of native coronary artery without angina pectoris: Secondary | ICD-10-CM | POA: Insufficient documentation

## 2015-04-23 DIAGNOSIS — Z87891 Personal history of nicotine dependence: Secondary | ICD-10-CM | POA: Diagnosis not present

## 2015-04-23 DIAGNOSIS — R410 Disorientation, unspecified: Secondary | ICD-10-CM | POA: Diagnosis present

## 2015-04-23 DIAGNOSIS — E109 Type 1 diabetes mellitus without complications: Secondary | ICD-10-CM | POA: Diagnosis not present

## 2015-04-23 DIAGNOSIS — Z9981 Dependence on supplemental oxygen: Secondary | ICD-10-CM | POA: Diagnosis not present

## 2015-04-23 DIAGNOSIS — G8929 Other chronic pain: Secondary | ICD-10-CM | POA: Insufficient documentation

## 2015-04-23 DIAGNOSIS — M109 Gout, unspecified: Secondary | ICD-10-CM | POA: Diagnosis not present

## 2015-04-23 DIAGNOSIS — S299XXA Unspecified injury of thorax, initial encounter: Secondary | ICD-10-CM | POA: Diagnosis not present

## 2015-04-23 DIAGNOSIS — R51 Headache: Secondary | ICD-10-CM | POA: Insufficient documentation

## 2015-04-23 DIAGNOSIS — I252 Old myocardial infarction: Secondary | ICD-10-CM | POA: Diagnosis not present

## 2015-04-23 DIAGNOSIS — M1990 Unspecified osteoarthritis, unspecified site: Secondary | ICD-10-CM | POA: Diagnosis not present

## 2015-04-23 LAB — CBC WITH DIFFERENTIAL/PLATELET
Basophils Absolute: 0.1 10*3/uL (ref 0.0–0.1)
Basophils Relative: 1 %
EOS PCT: 1 %
Eosinophils Absolute: 0.2 10*3/uL (ref 0.0–0.7)
HEMATOCRIT: 32.4 % — AB (ref 39.0–52.0)
Hemoglobin: 10.4 g/dL — ABNORMAL LOW (ref 13.0–17.0)
LYMPHS PCT: 21 %
Lymphs Abs: 2.8 10*3/uL (ref 0.7–4.0)
MCH: 27.2 pg (ref 26.0–34.0)
MCHC: 32.1 g/dL (ref 30.0–36.0)
MCV: 84.6 fL (ref 78.0–100.0)
MONO ABS: 1 10*3/uL (ref 0.1–1.0)
MONOS PCT: 7 %
NEUTROS ABS: 9.4 10*3/uL — AB (ref 1.7–7.7)
Neutrophils Relative %: 70 %
Platelets: 332 10*3/uL (ref 150–400)
RBC: 3.83 MIL/uL — ABNORMAL LOW (ref 4.22–5.81)
RDW: 14.4 % (ref 11.5–15.5)
WBC: 13.4 10*3/uL — ABNORMAL HIGH (ref 4.0–10.5)

## 2015-04-23 LAB — COMPREHENSIVE METABOLIC PANEL
ALT: 13 U/L — ABNORMAL LOW (ref 17–63)
ANION GAP: 7 (ref 5–15)
AST: 14 U/L — ABNORMAL LOW (ref 15–41)
Albumin: 3.3 g/dL — ABNORMAL LOW (ref 3.5–5.0)
Alkaline Phosphatase: 58 U/L (ref 38–126)
BILIRUBIN TOTAL: 0.5 mg/dL (ref 0.3–1.2)
BUN: 21 mg/dL — AB (ref 6–20)
CO2: 26 mmol/L (ref 22–32)
Calcium: 8.7 mg/dL — ABNORMAL LOW (ref 8.9–10.3)
Chloride: 103 mmol/L (ref 101–111)
Creatinine, Ser: 1 mg/dL (ref 0.61–1.24)
Glucose, Bld: 73 mg/dL (ref 65–99)
POTASSIUM: 4.3 mmol/L (ref 3.5–5.1)
Sodium: 136 mmol/L (ref 135–145)
TOTAL PROTEIN: 7 g/dL (ref 6.5–8.1)

## 2015-04-23 LAB — URINALYSIS, ROUTINE W REFLEX MICROSCOPIC
Bilirubin Urine: NEGATIVE
GLUCOSE, UA: NEGATIVE mg/dL
Hgb urine dipstick: NEGATIVE
Ketones, ur: NEGATIVE mg/dL
LEUKOCYTES UA: NEGATIVE
Nitrite: NEGATIVE
PH: 6 (ref 5.0–8.0)
Protein, ur: NEGATIVE mg/dL
Specific Gravity, Urine: 1.025 (ref 1.005–1.030)

## 2015-04-23 LAB — CBG MONITORING, ED: Glucose-Capillary: 74 mg/dL (ref 65–99)

## 2015-04-23 NOTE — ED Notes (Signed)
Pt knows where he is, month, day of week, knows family member with him at bedside but does not remember events of today,

## 2015-04-23 NOTE — ED Provider Notes (Signed)
CSN: [id number]     Arrival date & time 04/23/15  0602 History   First MD Initiated Contact with Patient 04/23/15 0700     Chief Complaint  Patient presents with  . Aggressive Behavior      HPI  Patient presents for evaluation with his wife. His wife states "he wants to know what's going on all up in his head."  Patient reports that he said slow progressive worsening of memory over the last several months. He tells her that he is emotionally labile will sometimes "just cry" for no apparent reason at home. He occasionally has some outbursts. This morning he began very frustrated because the TV wouldn't "cut on". He threr the TV remote. He has not been harmful or threatening to his wife. However, at times he will tell her to "just get out of here".  He states some balance issues. He has falls frequently. He fell on Friday nights the day after he was discharged from the hospital for COPD. He fractured his upper dental plate had some injuries to his gum. Had normal evaluation at night and was again discharged home.  He denies any physical complaints. Wife states that he "choked" a lot since he fractured his upper dental plate. Denies fever. No shortness of breath. No nausea or vomiting.  Past Medical History  Diagnosis Date  . GERD (gastroesophageal reflux disease)   . CAD (coronary artery disease)   . Obesity   . Hyperlipidemia   . Hypertension   . Chronic back pain   . Gout   . Chronic bronchitis ([place])     "get it q yr"  . Psoriasis   . COPD (chronic obstructive pulmonary disease) ([place])   . Myocardial infarction ([place]) 1999  . Pneumonia     "a few times; last time was ~ 06/2013"  . On home oxygen therapy     "2L; only at night" (08/30/2013)  . Diabetes mellitus, type 1   . Arthritis of knee   . Arthritis     "left leg" (08/30/2013)   Past Surgical History  Procedure Laterality Date  . Appendectomy    . Colonoscopy  03/17/2012    Procedure: COLONOSCOPY;  Surgeon: Rogene Houston, MD;  Location: [facility] ENDO SUITE;  Service: Endoscopy;  Laterality: N/A;  830  . Cystoscopy with urethral dilatation N/A 11/26/2012    Procedure: CYSTOSCOPY WITH URETHRAL DILATATION;  Surgeon: Malka So, MD;  Location: [facility] ORS;  Service: Urology;  Laterality: N/A;  . Coronary angioplasty with stent placement  1999- 2009-08/30/2013    "counting today's, I have 5 stents" (08/30/2013)  . Left heart catheterization with coronary angiogram N/A 08/30/2013    Procedure: LEFT HEART CATHETERIZATION WITH CORONARY ANGIOGRAM;  Surgeon: Clent Demark, MD;  Location: [facility] CATH LAB;  Service: Cardiovascular;  Laterality: N/A;  . Percutaneous coronary stent intervention (pci-s) Right 08/30/2013    Procedure: PERCUTANEOUS CORONARY STENT INTERVENTION (PCI-S);  Surgeon: Clent Demark, MD;  Location: [facility] CATH LAB;  Service: Cardiovascular;  Laterality: Right;   Family History  Problem Relation Age of Onset  . Hypertension Mother   . Diabetes Mother   . Hypertension Father   . Diabetes Brother   . Arthritis      Family History   . Diabetes      family History   . Stroke Daughter    Social History  Substance Use Topics  . Smoking status: Former Smoker -- 0.50 packs/day for 33 years  Types: Cigarettes  . Smokeless tobacco: Never Used     Comment: "stopped smoking in the 1990's  . Alcohol Use: No    Review of Systems  Constitutional: Negative for fever, chills, diaphoresis, appetite change and fatigue.  HENT: Negative for mouth sores, sore throat and trouble swallowing.   Eyes: Negative for visual disturbance.  Respiratory: Positive for cough and choking. Negative for chest tightness, shortness of breath and wheezing.   Cardiovascular: Negative for chest pain.  Gastrointestinal: Negative for nausea, vomiting, abdominal pain, diarrhea and abdominal distention.  Endocrine: Negative for polydipsia, polyphagia and polyuria.  Genitourinary: Negative for dysuria, frequency and hematuria.    Musculoskeletal: Negative for gait problem.  Skin: Negative for color change, pallor and rash.  Neurological: Positive for headaches. Negative for dizziness, syncope and light-headedness.  Hematological: Does not bruise/bleed easily.  Psychiatric/Behavioral: Positive for confusion, dysphoric mood and agitation. Negative for behavioral problems.      Allergies  Review of patient's allergies indicates no known allergies.  Home Medications   Prior to Admission medications   Medication Sig Start Date End Date Taking? Authorizing Provider  acetaminophen (TYLENOL) 500 MG tablet Take 500 mg by mouth every 6 (six) hours as needed for moderate pain (left thigh).    Historical Provider, MD  albuterol (PROVENTIL HFA;VENTOLIN HFA) 108 (90 BASE) MCG/ACT inhaler Inhale 1-2 puffs into the lungs every 6 (six) hours as needed for wheezing or shortness of breath. 11/13/14   Nat Christen, MD  allopurinol (ZYLOPRIM) 300 MG tablet Take 300 mg by mouth daily. 05/02/13   Historical Provider, MD  amLODipine (NORVASC) 10 MG tablet Take 1 tablet (10 mg total) by mouth daily. 09/20/14   Fayrene Helper, MD  aspirin 81 MG tablet Take 81 mg by mouth daily.    Historical Provider, MD  blood glucose meter kit and supplies KIT Dispense based on patient and insurance preference. Use up to four times daily as directed. (FOR ICD-9 250.00, 250.01). 11/15/14   Virgel Manifold, MD  glucose blood (ACCU-CHEK AVIVA PLUS) test strip Use one strip to check glucose four times daily 12/28/14   Fayrene Helper, MD  insulin NPH-regular Human (NOVOLIN 70/30) (70-30) 100 UNIT/ML injection Inject 60 Units into the skin 2 (two) times daily with a meal. 04/20/15   Erline Hau, MD  Insulin Syringe-Needle U-100 (INSULIN SYRINGE 1CC/31GX5/16") 31G X 5/16" 1 ML MISC 60 Units by Does not apply route 2 (two) times daily. 02/19/15   Cassandria Anger, MD  levothyroxine (SYNTHROID, LEVOTHROID) 75 MCG tablet Take 75 mcg by mouth every  morning.     Historical Provider, MD  lisinopril (PRINIVIL,ZESTRIL) 40 MG tablet Take 1 tablet (40 mg total) by mouth daily. 09/20/14   Fayrene Helper, MD  metFORMIN (GLUCOPHAGE) 500 MG tablet Take 1 tablet (500 mg total) by mouth 2 (two) times daily. 09/02/13   Charolette Forward, MD  metoprolol (LOPRESSOR) 50 MG tablet Take 1 tablet (50 mg total) by mouth 2 (two) times daily. 12/25/14   Fayrene Helper, MD  pravastatin (PRAVACHOL) 40 MG tablet Take 1 tablet (40 mg total) by mouth daily. 09/20/14   Fayrene Helper, MD  spironolactone (ALDACTONE) 25 MG tablet Take 1 tablet (25 mg total) by mouth daily. 12/25/14   Fayrene Helper, MD   BP 138/77 mmHg  Pulse 84  Temp(Src) 97.9 F (36.6 C) (Oral)  Resp 24  Ht 5' 10.5" (1.791 m)  Wt 240 lb (108.863 kg)  BMI 33.94 kg/m2  SpO2 94% Physical Exam  Constitutional: He is oriented to person, place, and time. He appears well-developed and well-nourished. No distress.  HENT:  Head: Normocephalic.  Mouth/Throat:    Eyes: Conjunctivae are normal. Pupils are equal, round, and reactive to light. No scleral icterus.  Neck: Normal range of motion. Neck supple. No thyromegaly present.  Cardiovascular: Normal rate and regular rhythm.  Exam reveals no gallop and no friction rub.   No murmur heard. Pulmonary/Chest: Effort normal and breath sounds normal. No respiratory distress. He has no wheezes. He has no rales.  Abdominal: Soft. Bowel sounds are normal. He exhibits no distension. There is no tenderness. There is no rebound.  Musculoskeletal: Normal range of motion.  Neurological: He is alert and oriented to person, place, and time. He has normal strength. No cranial nerve deficit or sensory deficit. He displays a negative Romberg sign.  Skin: Skin is warm and dry. No rash noted.  Psychiatric: He has a normal mood and affect. His behavior is normal.    ED Course  Procedures (including critical care time) Labs Review Labs Reviewed  CBC WITH  DIFFERENTIAL/PLATELET - Abnormal; Notable for the following:    WBC 13.4 (*)    RBC 3.83 (*)    Hemoglobin 10.4 (*)    HCT 32.4 (*)    Neutro Abs 9.4 (*)    All other components within normal limits  COMPREHENSIVE METABOLIC PANEL - Abnormal; Notable for the following:    BUN 21 (*)    Calcium 8.7 (*)    Albumin 3.3 (*)    AST 14 (*)    ALT 13 (*)    All other components within normal limits  URINALYSIS, ROUTINE W REFLEX MICROSCOPIC (NOT AT [facility])  CBG MONITORING, ED    Imaging Review Dg Chest 2 View  04/23/2015  CLINICAL DATA:  74 year old male with acute agitation, aggressive behavior. Recent falls. Initial encounter. EXAM: CHEST  2 VIEW COMPARISON:  04/18/2015 and earlier. FINDINGS: Stable cardiac size and mediastinal contours. Cardiac size at the upper limits of normal. Visualized tracheal air column is within normal limits. Mild apical and peripheral scarring in the upper lobes appear stable and similar to that on chest CTA 05/09/2013. No pneumothorax, pulmonary edema, pleural effusion or acute pulmonary opacity. No acute osseous abnormality identified. IMPRESSION: No acute cardiopulmonary abnormality. Electronically Signed   By: Genevie [name] M.D.   On: 04/23/2015 08:14   Ct Head Wo Contrast  04/23/2015  CLINICAL DATA:  74 year old male with agitation, aggressive behavior this morning. Recent falls. Initial encounter. EXAM: CT HEAD WITHOUT CONTRAST TECHNIQUE: Contiguous axial images were obtained from the base of the skull through the vertex without intravenous contrast. COMPARISON:  04/20/2015 and earlier. FINDINGS: Visualized paranasal sinuses and mastoids are clear. No scalp hematoma. Visualized orbit soft tissues are within normal limits. No acute osseous abnormality identified. Calcified atherosclerosis at the skull base. Cerebral volume remains within normal limits for age. Chronic bilateral thalamic lacunar infarcts appear stable since 2011. Elsewhere normal for age gray-white matter  differentiation. No cortically based acute infarct identified. No ventriculomegaly. No midline shift, mass effect, or evidence of intracranial mass lesion. No acute intracranial hemorrhage identified. No suspicious intracranial vascular hyperdensity. IMPRESSION: No acute intracranial abnormality. Chronic lacunar infarcts in the bilateral thalami. Electronically Signed   By: Genevie [name] M.D.   On: 04/23/2015 08:17   I have personally reviewed and evaluated these images and lab results as part of my medical decision-making.   EKG Interpretation  Date/Time:  Monday April 23 2015 07:34:26 EST Ventricular Rate:  76 PR Interval:  201 QRS Duration: 74 QT Interval:  409 QTC Calculation: 460 R Axis:   -16 Text Interpretation:  Sinus rhythm Borderline left axis deviation  Confirmed by Jeneen Rinks  MD, [place] ([id number]) on 04/23/2015 7:39:46 AM      MDM   Final diagnoses:  Dementia, with behavioral disturbance  Depression    Medical testing including lab and imaging shows no acute abnormalities today. His symptoms and findings and history are consistent with that of a mild progressive dementia. No signs or symptoms suggestive of an acute delirium. He does have some depressive and emotional symptoms which may be explainable by his dementia as well. I recommended they see primary care regarding further discussion about different screening testing for, and possible treatment for depression and dementia.    Tanna Furry, MD 04/23/15 [phone]

## 2015-04-23 NOTE — ED Notes (Signed)
Pt presents to er with family for further evaluation of previous falls and aggressive behavior this am, family reports that pt has had several falls and has been seen in er for same, this am pt became angry and threw the remote control. Pt denies any pain, pt denies any memory of event of this am, states that he just remembers getting up.

## 2015-04-23 NOTE — ED Notes (Signed)
MD at bedside. 

## 2015-04-23 NOTE — Discharge Instructions (Signed)
Follow-up with his primary care physician to discuss additional testing 4, or possible treatment for dementia and mild depression.   Dementia Dementia is a general term for problems with brain function. A person with dementia has memory loss and a hard time with at least one other brain function such as thinking, speaking, or problem solving. Dementia can affect social functioning, how you do your job, your mood, or your personality. The changes may be hidden for a long time. The earliest forms of this disease are usually not detected by family or friends. Dementia can be:  Irreversible.  Potentially reversible.  Partially reversible.  Progressive. This means it can get worse over time. CAUSES  Irreversible dementia causes may include:  Degeneration of brain cells (Alzheimer disease or Lewy body dementia).  Multiple small strokes (vascular dementia).  Infection (chronic meningitis or Creutzfeldt-Jakob disease).  Frontotemporal dementia. This affects younger people, age 55 to 39, compared to those who have Alzheimer disease.  Dementia associated with other disorders like Parkinson disease, Huntington disease, or HIV-associated dementia. Potentially or partially reversible dementia causes may include:  Medicines.  Metabolic causes such as excessive alcohol intake, vitamin B12 deficiency, or thyroid disease.  Masses or pressure in the brain such as a tumor, blood clot, or hydrocephalus. SIGNS AND SYMPTOMS  Symptoms are often hard to detect. Family members or coworkers may not notice them early in the disease process. Different people with dementia may have different symptoms. Symptoms can include:  A hard time with memory, especially recent memory. Long-term memory may not be impaired.  Asking the same question multiple times or forgetting something someone just said.  A hard time speaking your thoughts or finding certain words.  A hard time solving problems or performing  familiar tasks (such as how to use a telephone).  Sudden changes in mood.  Changes in personality, especially increasing moodiness or mistrust.  Depression.  A hard time understanding complex ideas that were never a problem in the past. DIAGNOSIS  There are no specific tests for dementia.   Your health care provider may recommend a thorough evaluation. This is because some forms of dementia can be reversible. The evaluation will likely include a physical exam and getting a detailed history from you and a family member. The history often gives the best clues and suggestions for a diagnosis.  Memory testing may be done. A detailed brain function evaluation called neuropsychologic testing may be helpful.  Lab tests and brain imaging (such as a CT scan or MRI scan) are sometimes important.  Sometimes observation and re-evaluation over time is very helpful. TREATMENT  Treatment depends on the cause.   If the problem is a vitamin deficiency, it may be helped or cured with supplements.  For dementias such as Alzheimer disease, medicines are available to stabilize or slow the course of the disease. There are no cures for this type of dementia.  Your health care provider can help direct you to groups, organizations, and other health care providers to help with decisions in the care of you or your loved one. HOME CARE INSTRUCTIONS The care of individuals with dementia is varied and dependent upon the progression of the dementia. The following suggestions are intended for the person living with, or caring for, the person with dementia.  Create a safe environment.  Remove the locks on bathroom doors to prevent the person from accidentally locking himself or herself in.  Use childproof latches on kitchen cabinets and any place where cleaning supplies, chemicals,  or alcohol are kept.  Use childproof covers in unused electrical outlets.  Install childproof devices to keep doors and windows  secured.  Remove stove knobs or install safety knobs and an automatic shut-off on the stove.  Lower the temperature on water heaters.  Label medicines and keep them locked up.  Secure knives, lighters, matches, power tools, and guns, and keep these items out of reach.  Keep the house free from clutter. Remove rugs or anything that might contribute to a fall.  Remove objects that might break and hurt the person.  Make sure lighting is good, both inside and outside.  Install grab rails as needed.  Use a monitoring device to alert you to falls or other needs for help.  Reduce confusion.  Keep familiar objects and people around.  Use night lights or dim lights at night.  Label items or areas.  Use reminders, notes, or directions for daily activities or tasks.  Keep a simple, consistent routine for waking, meals, bathing, dressing, and bedtime.  Create a calm, quiet environment.  Place large clocks and calendars prominently.  Display emergency numbers and home address near all telephones.  Use cues to establish different times of the day. An example is to open curtains to let the natural light in during the day.   Use effective communication.  Choose simple words and short sentences.  Use a gentle, calm tone of voice.  Be careful not to interrupt.  If the person is struggling to find a word or communicate a thought, try to provide the word or thought.  Ask one question at a time. Allow the person ample time to answer questions. Repeat the question again if the person does not respond.  Reduce nighttime restlessness.  Provide a comfortable bed.  Have a consistent nighttime routine.  Ensure a regular walking or physical activity schedule. Involve the person in daily activities as much as possible.  Limit napping during the day.  Limit caffeine.  Attend social events that stimulate rather than overwhelm the senses.  Encourage good nutrition and  hydration.  Reduce distractions during meal times and snacks.  Avoid foods that are too hot or too cold.  Monitor chewing and swallowing ability.  Continue with routine vision, hearing, dental, and medical screenings.  Give medicines only as directed by the health care provider.  Monitor driving abilities. Do not allow the person to drive when safe driving is no longer possible.  Register with an identification program which could provide location assistance in the event of a missing person situation. SEEK MEDICAL CARE IF:   New behavioral problems start such as moodiness, aggressiveness, or seeing things that are not there (hallucinations).  Any new problem with brain function happens. This includes problems with balance, speech, or falling a lot.  Problems with swallowing develop.  Any symptoms of other illness happen. Small changes or worsening in any aspect of brain function can be a sign that the illness is getting worse. It can also be a sign of another medical illness such as infection. Seeing a health care provider right away is important. SEEK IMMEDIATE MEDICAL CARE IF:   A fever develops.  New or worsened confusion develops.  New or worsened sleepiness develops.  Staying awake becomes hard to do.   This information is not intended to replace advice given to you by your health care provider. Make sure you discuss any questions you have with your health care provider.   Document Released: 10/29/2000 Document Revised: 05/26/2014  Document Reviewed: 09/30/2010 Elsevier Interactive Patient Education [employer].

## 2015-04-24 ENCOUNTER — Ambulatory Visit: Payer: Self-pay

## 2015-04-24 ENCOUNTER — Other Ambulatory Visit: Payer: Self-pay | Admitting: *Deleted

## 2015-04-24 NOTE — Patient Outreach (Signed)
Call for Transition of Care  Set up home visit for tomorrow am. Plan to visit tomorrow [name]. Laymond Purser, RN, BSN, [place] [phone]

## 2015-04-25 ENCOUNTER — Other Ambulatory Visit: Payer: Self-pay | Admitting: *Deleted

## 2015-04-25 NOTE — Patient Outreach (Signed)
[facility]) Care Management   04/25/2015  [name] 1941-02-22 [id number]  [name] is an 74 y.o. male  Met with patient and his wife.  Subjective:  Patient and wife report patient has had several falls over the past week, some due to weakness, some due to dizziness, unsure if he had loss of consciousness. Patient has been in ED several times over weekend, no admission since last week. Wife reports she plans to discuss changes in patient behavior, memory and weakness with primary care MD at 12/8 appointment. Wife plans to retire in order to stay home with patient as he is in need of increased oversight and assistance.   Patient still has some intermittent coughing from COPD, did not qualify for oxygen.   Objective:   Patient neatly groomed and dressed. Home neat and clean BP 126/64 mmHg  Pulse 68  Resp 20  Wt 230 lb (104.327 kg)  SpO2 92% Review of Systems  Constitutional: Negative.   HENT: Negative.   Eyes: Negative.   Respiratory: Positive for cough.   Cardiovascular: Negative.   Gastrointestinal: Negative.   Genitourinary: Negative.   Musculoskeletal: Negative.   Skin: Negative.   Neurological: Negative.   Endo/Heme/Allergies: Negative.   Psychiatric/Behavioral: Negative.     Physical Exam  Constitutional: He appears well-developed and well-nourished.  Neck: Normal range of motion.  Cardiovascular: Normal rate and regular rhythm.   Respiratory: Effort normal.  Decreased breath sounds  GI: Soft. Bowel sounds are normal.  Musculoskeletal: Normal range of motion.  Neurological: He is alert.  Issues with memory   Skin: Skin is warm and dry.    Current Medications:   Current Outpatient Prescriptions  Medication Sig Dispense Refill  . acetaminophen (TYLENOL) 500 MG tablet Take 500 mg by mouth every 6 (six) hours as needed for moderate pain (left thigh).    Marland Kitchen albuterol (PROVENTIL HFA;VENTOLIN HFA) 108 (90 BASE) MCG/ACT inhaler  Inhale 1-2 puffs into the lungs every 6 (six) hours as needed for wheezing or shortness of breath. 1 Inhaler 2  . allopurinol (ZYLOPRIM) 300 MG tablet Take 300 mg by mouth daily.    Marland Kitchen amLODipine (NORVASC) 10 MG tablet Take 1 tablet (10 mg total) by mouth daily. 90 tablet 1  . aspirin 81 MG tablet Take 81 mg by mouth daily.    . insulin NPH-regular Human (NOVOLIN 70/30) (70-30) 100 UNIT/ML injection Inject 60 Units into the skin 2 (two) times daily with a meal. 10 mL 2  . levothyroxine (SYNTHROID, LEVOTHROID) 75 MCG tablet Take 75 mcg by mouth every morning.     Marland Kitchen lisinopril (PRINIVIL,ZESTRIL) 40 MG tablet Take 1 tablet (40 mg total) by mouth daily. 90 tablet 1  . metFORMIN (GLUCOPHAGE) 500 MG tablet Take 1 tablet (500 mg total) by mouth 2 (two) times daily. 60 tablet 3  . metoprolol (LOPRESSOR) 50 MG tablet Take 1 tablet (50 mg total) by mouth 2 (two) times daily. 180 tablet 3  . pravastatin (PRAVACHOL) 40 MG tablet Take 1 tablet (40 mg total) by mouth daily. 90 tablet 1  . spironolactone (ALDACTONE) 25 MG tablet Take 1 tablet (25 mg total) by mouth daily. 90 tablet 3   No current facility-administered medications for this visit.    Functional Status:   In your present state of health, do you have any difficulty performing the following activities: 04/25/2015 04/20/2015  Hearing? N -  Vision? N -  Difficulty concentrating or making decisions? N -  Walking or  climbing stairs? Y -  Dressing or bathing? N -  Doing errands, shopping? Y N  Preparing Food and eating ? Y -  Using the Toilet? N -  In the past six months, have you accidently leaked urine? N -  Do you have problems with loss of bowel control? N -  Managing your Medications? Y -  Managing your Finances? N -  Housekeeping or managing your Housekeeping? Y -    Fall/Depression Screening:    Fall Risk  04/25/2015 03/27/2015 02/27/2015 01/31/2015 11/30/2014  Falls in the past year? Yes No No No Yes  Number falls in past yr: 2 or more -  - - 1  Injury with Fall? No - - - No  Risk Factor Category  High Fall Risk - - - -  Risk for fall due to : History of fall(s);Mental status change - - - History of fall(s);Impaired balance/gait  Follow up Falls evaluation completed;Education provided - - - Falls evaluation completed   PHQ 2/9 Scores 04/25/2015 03/27/2015 02/27/2015 01/31/2015 11/30/2014 11/16/2014 12/08/2013  PHQ - 2 Score 0 0 0 0 0 0 0    Assessment:   Diabetes: Patient sees Dr. [name] for diabetes. Patient keeps good record of blood sugars and understands how to take his insulin  HF-Patient without any symptoms at this time, has appointment with Cardiologist in January  COPD-Patient with recent hospitalization for COPD exacerbation, reviewed zones and signs and symptoms of exacerbation, Patient sees Dr. Luan Pulling and has appointment in January  Memory issues- Patient with recent ED visit due to confusion, wife very concerned, instructed to speak with MD at appoint on 12/8 regarding concerns. RNCM gave med box for wife to assist patient with medication management.  Risk for falls- Patient has had several falls due to dizziness over the past week, encouraged use of walker in home and to consider bath bench for bathing.   Plan:   [facility] CM Care Plan Problem One        Most Recent Value   Care Plan Problem One  Safety-increased Fall risk as evidenced by recent falls   Role Documenting the Problem One  Care Management [place] for Problem One  Active   [facility] CM Short Term Goal #1 (0-30 days)  Patient will use walker or cane for ambulation around home over next 30 days    [facility] CM Short Term Goal #1 Start Date  04/25/15   Interventions for Short Term Goal #1  Using teachback reviewed safety risk, discussed using cane or walker. Also, encouraged getting bath bench for bathroom safety    [facility] CM Care Plan Problem Two        Most Recent Value   Care Plan Problem Two  High risk for COPD exacerbation   Role Documenting the  Problem Two  Care Management Coordinator   Care Plan for Problem Two  Active   Interventions for Problem Two Long Term Goal   Using teachback, reviewed COPD signs and symptoms and action plan to prevent exacerbation with patient and wife   [facility] Long Term Goal (31-90) days  Patient will not have COPD exacerbation in the next 60 days   [facility] Long Term Goal Start Date  04/25/15   [facility] CM Short Term Goal #1 (0-30 days)  Patient will be able to describe COPD green zone within 30 days.     [facility] CM Short Term Goal #1 Met Date   04/25/15   Interventions for Short Term  Goal #2   Reviewed COPD zones with patient and wife     Will revisit in January Wife or patient will call RNCM with concerns  Royetta Crochet. Laymond Purser, RN, BSN, [place] [phone]

## 2015-04-26 ENCOUNTER — Encounter: Payer: Self-pay | Admitting: *Deleted

## 2015-04-26 ENCOUNTER — Encounter: Payer: Self-pay | Admitting: Family Medicine

## 2015-04-26 ENCOUNTER — Ambulatory Visit (INDEPENDENT_AMBULATORY_CARE_PROVIDER_SITE_OTHER): Payer: Commercial Managed Care - HMO | Admitting: Family Medicine

## 2015-04-26 VITALS — BP 128/62 | HR 76 | Resp 16 | Ht 71.0 in | Wt 242.8 lb

## 2015-04-26 DIAGNOSIS — J441 Chronic obstructive pulmonary disease with (acute) exacerbation: Secondary | ICD-10-CM

## 2015-04-26 DIAGNOSIS — R410 Disorientation, unspecified: Secondary | ICD-10-CM | POA: Insufficient documentation

## 2015-04-26 DIAGNOSIS — Z09 Encounter for follow-up examination after completed treatment for conditions other than malignant neoplasm: Secondary | ICD-10-CM

## 2015-04-26 DIAGNOSIS — R1310 Dysphagia, unspecified: Secondary | ICD-10-CM

## 2015-04-26 DIAGNOSIS — E1065 Type 1 diabetes mellitus with hyperglycemia: Secondary | ICD-10-CM

## 2015-04-26 DIAGNOSIS — R296 Repeated falls: Secondary | ICD-10-CM

## 2015-04-26 DIAGNOSIS — R55 Syncope and collapse: Secondary | ICD-10-CM | POA: Diagnosis not present

## 2015-04-26 DIAGNOSIS — IMO0002 Reserved for concepts with insufficient information to code with codable children: Secondary | ICD-10-CM

## 2015-04-26 DIAGNOSIS — F05 Delirium due to known physiological condition: Secondary | ICD-10-CM

## 2015-04-26 DIAGNOSIS — I10 Essential (primary) hypertension: Secondary | ICD-10-CM

## 2015-04-26 DIAGNOSIS — R0989 Other specified symptoms and signs involving the circulatory and respiratory systems: Secondary | ICD-10-CM

## 2015-04-26 DIAGNOSIS — E1059 Type 1 diabetes mellitus with other circulatory complications: Secondary | ICD-10-CM

## 2015-04-26 NOTE — Assessment & Plan Note (Addendum)
3 week h/o intermittent confusion and emotional lability, at times resulting in verbally abusive behavior in pt with recent h/o recurrent falls and  Near syncope Carotid dopplwr, brain scan, and cardiology eval to evaluate for possible cardiac source of new confusion, as well as neurology eval asap

## 2015-04-26 NOTE — Patient Instructions (Addendum)
F/u in 5 weeks, call if you need me sooner  You are being referred to a neurologist , and also to your cardiologist  You are referred for an [place] of neck arteries  Hope you get to feeling better  Thanks for choosing [facility], we consider it a privelige to serve you.

## 2015-04-26 NOTE — Assessment & Plan Note (Signed)
Four witnessed falls in past 7 days, not associated with change in position and causing pt to fall, needs brain mRI and carotid doppler. Will also refer to both cardiology and neurology

## 2015-04-26 NOTE — Progress Notes (Signed)
Subjective:    Patient ID: [name], male    DOB: 10-24-1940, 74 y.o.   MRN: [id number]  HPI  Pt in for hospital follow up due to altered mental status, repeated near syncope with falls, 4 in the past 1 week, and also behavioral changes with crying spells and episodes of confusion. The behavioral changes have been notes for the past 6 to 8 weeks by his spouse. He is noted to reprot increased forgetfulness, with episodes of crying spells and depression . In the past 2 to 3 weeks, his wife reports noting him just having near passing out spells , when he falls to the floor, even when sitting in a chair. He has had 4 falls witnessed in the past 4 weeks Hospital records have been and are reviewed at the time of  the visit Review of Systems See HPI Denies recent fever or chills. Denies sinus pressure, nasal congestion, ear pain or sore throat. Denies chest congestion, productive cough or wheezing. Denies chest pains, palpitations and leg swelling Denies abdominal pain, nausea, vomiting,diarrhea or constipation.   Denies dysuria, frequency, hesitancy or incontinence.  Denies skin break down or rash.        Objective:   Physical Exam BP 128/62 mmHg  Pulse 76  Resp 16  Ht 5\' 11"  (1.803 m)  Wt 242 lb 12.8 oz (110.133 kg)  BMI 33.88 kg/m2  SpO2 92%  Patient alert and oriented and in no cardiopulmonary distress.  HEENT: No facial asymmetry, EOMI,   oropharynx pink and moist.  Neck supple no JVD, no mass.Bilateral bruits Chest: Clear to auscultation bilaterally.  CVS: S1, S2 no murmurs, no S3.Regular rate.  ABD: Soft non tender.   Ext: No edema  MS: decreased ROM spine, shoulders, hips and knees.  Skin: Intact, no ulcerations or rash noted.  Psych: Good eye contact,labile affect. Memory impaired, becomes confused at times not anxious however tearful and depressed  depressed appearing during the visit when he started recalling his acute decompensation mentally 2 days  ago when he said unkind words to his spouse which actually scared her  CNS: CN 2-12 intact, power,  normal throughout.no focal deficits noted.        Assessment & Plan:  Near syncope Four witnessed falls in past 7 days, not associated with change in position and causing pt to fall, needs brain mRI and carotid doppler. Will also refer to both cardiology and neurology  Subacute confusional state 3 week h/o intermittent confusion and emotional lability, at times resulting in verbally abusive behavior in pt with recent h/o recurrent falls and  Near syncope Carotid dopplwr, brain scan, and cardiology eval to evaluate for possible cardiac source of new confusion, as well as neurology eval asap  Dysphagia C/o ongoing "choking episodes" when attempting to swallow regular food at times becomes extremely short of breath, needs to follow recommended diet  COPD with acute exacerbation Resolved following recent inpatient stay  Essential hypertension Controlled, no change in medication DASH diet and commitment to daily physical activity for a minimum of 30 minutes discussed and encouraged, as a part of hypertension management. The importance of attaining a healthy weight is also discussed.  BP/Weight 04/26/2015 04/25/2015 04/23/2015 04/20/2015 04/20/2015 04/19/2015 99991111  Systolic BP 0000000 123XX123 123456 [id number] [id number] - Q000111Q  Diastolic BP 62 64 77 77 63 - 82  Wt. (Lbs) 242.8 230 240 230 - 240.8 230  BMI 33.88 32.52 33.94 33 - 34.55 33  Diabetes mellitus, insulin dependent (IDDM), uncontrolled ([place]) Improved, followed by [name] is reminded of the importance of commitment to daily physical activity for 30 minutes or more, as able and the need to limit carbohydrate intake to 30 to 60 grams per meal to help with blood sugar control.   The need to take medication as prescribed, test blood sugar as directed, and to call between visits if there is a concern that blood sugar is uncontrolled is  also discussed.   [name] is reminded of the importance of daily foot exam, annual eye examination, and good blood sugar, blood pressure and cholesterol control.  Diabetic Labs Latest Ref Rng 04/23/2015 04/20/2015 04/19/2015 04/18/2015 03/31/2015  HbA1c 4.8 - 5.6 % - - 7.4(H) - -  Microalbumin <2.0 mg/dL - - - - -  Micro/Creat Ratio 0.0 - 30.0 mg/g - - - - -  Chol 125 - 200 mg/dL - - - - 128  HDL >=40 mg/dL - - - - 24(L)  Calc LDL <130 mg/dL - - - - 64  Triglycerides <150 mg/dL - - - - 201(H)  Creatinine 0.61 - 1.24 mg/dL 1.00 1.18 1.32(H) 1.25(H) 1.04   BP/Weight 04/26/2015 04/25/2015 04/23/2015 04/20/2015 04/20/2015 04/19/2015 99991111  Systolic BP 0000000 [id number] 123456 123456 [id number] - Q000111Q  Diastolic BP 62 64 77 77 63 - 82  Wt. (Lbs) 242.8 230 240 230 - 240.8 230  BMI 33.88 32.52 33.94 33 - 34.55 33   Foot/eye exam completion dates Latest Ref Rng 12/28/2014 11/16/2014  Eye Exam No Retinopathy - -  Foot exam Order - - -  Foot Form Completion - Done Done         Recurrent falls 4 falls in past 1 week, some due to near syncope, has fallen while sitting on a chair  Hospital discharge follow-up Recent hospitalization with COPD exaccerbation , now resolved , however, has had several near syncopal episodes with 4 falls since this past week following discharge

## 2015-04-27 ENCOUNTER — Other Ambulatory Visit: Payer: Self-pay | Admitting: Cardiology

## 2015-04-27 DIAGNOSIS — E785 Hyperlipidemia, unspecified: Secondary | ICD-10-CM | POA: Diagnosis not present

## 2015-04-27 DIAGNOSIS — I252 Old myocardial infarction: Secondary | ICD-10-CM | POA: Diagnosis not present

## 2015-04-27 DIAGNOSIS — I119 Hypertensive heart disease without heart failure: Secondary | ICD-10-CM | POA: Diagnosis not present

## 2015-04-27 DIAGNOSIS — E119 Type 2 diabetes mellitus without complications: Secondary | ICD-10-CM | POA: Diagnosis not present

## 2015-04-27 DIAGNOSIS — E669 Obesity, unspecified: Secondary | ICD-10-CM | POA: Diagnosis not present

## 2015-04-27 DIAGNOSIS — I2510 Atherosclerotic heart disease of native coronary artery without angina pectoris: Secondary | ICD-10-CM | POA: Diagnosis not present

## 2015-04-27 DIAGNOSIS — R079 Chest pain, unspecified: Secondary | ICD-10-CM

## 2015-04-27 DIAGNOSIS — R0609 Other forms of dyspnea: Secondary | ICD-10-CM | POA: Diagnosis not present

## 2015-04-29 DIAGNOSIS — R296 Repeated falls: Secondary | ICD-10-CM | POA: Insufficient documentation

## 2015-04-29 DIAGNOSIS — Z09 Encounter for follow-up examination after completed treatment for conditions other than malignant neoplasm: Secondary | ICD-10-CM | POA: Insufficient documentation

## 2015-04-29 NOTE — Assessment & Plan Note (Signed)
Controlled, no change in medication DASH diet and commitment to daily physical activity for a minimum of 30 minutes discussed and encouraged, as a part of hypertension management. The importance of attaining a healthy weight is also discussed.  BP/Weight 04/26/2015 04/25/2015 04/23/2015 04/20/2015 04/20/2015 04/19/2015 99991111  Systolic BP 0000000 [id number] 123456 123456 [id number] - Q000111Q  Diastolic BP 62 64 77 77 63 - 82  Wt. (Lbs) 242.8 230 240 230 - 240.8 230  BMI 33.88 32.52 33.94 33 - 34.55 33

## 2015-04-29 NOTE — Assessment & Plan Note (Signed)
C/o ongoing "choking episodes" when attempting to swallow regular food at times becomes extremely short of breath, needs to follow recommended diet

## 2015-04-29 NOTE — Assessment & Plan Note (Signed)
Improved, followed by [name] is reminded of the importance of commitment to daily physical activity for 30 minutes or more, as able and the need to limit carbohydrate intake to 30 to 60 grams per meal to help with blood sugar control.   The need to take medication as prescribed, test blood sugar as directed, and to call between visits if there is a concern that blood sugar is uncontrolled is also discussed.   [name] is reminded of the importance of daily foot exam, annual eye examination, and good blood sugar, blood pressure and cholesterol control.  Diabetic Labs Latest Ref Rng 04/23/2015 04/20/2015 04/19/2015 04/18/2015 03/31/2015  HbA1c 4.8 - 5.6 % - - 7.4(H) - -  Microalbumin <2.0 mg/dL - - - - -  Micro/Creat Ratio 0.0 - 30.0 mg/g - - - - -  Chol 125 - 200 mg/dL - - - - 128  HDL >=40 mg/dL - - - - 24(L)  Calc LDL <130 mg/dL - - - - 64  Triglycerides <150 mg/dL - - - - 201(H)  Creatinine 0.61 - 1.24 mg/dL 1.00 1.18 1.32(H) 1.25(H) 1.04   BP/Weight 04/26/2015 04/25/2015 04/23/2015 04/20/2015 04/20/2015 04/19/2015 99991111  Systolic BP 0000000 [id number] 123456 123456 [id number] - Q000111Q  Diastolic BP 62 64 77 77 63 - 82  Wt. (Lbs) 242.8 230 240 230 - 240.8 230  BMI 33.88 32.52 33.94 33 - 34.55 33   Foot/eye exam completion dates Latest Ref Rng 12/28/2014 11/16/2014  Eye Exam No Retinopathy - -  Foot exam Order - - -  Foot Form Completion - Done Done

## 2015-04-29 NOTE — Assessment & Plan Note (Signed)
4 falls in past 1 week, some due to near syncope, has fallen while sitting on a chair

## 2015-04-29 NOTE — Assessment & Plan Note (Signed)
Recent hospitalization with COPD exaccerbation , now resolved , however, has had several near syncopal episodes with 4 falls since this past week following discharge

## 2015-04-29 NOTE — Assessment & Plan Note (Signed)
Resolved following recent inpatient stay

## 2015-05-01 ENCOUNTER — Ambulatory Visit (INDEPENDENT_AMBULATORY_CARE_PROVIDER_SITE_OTHER): Payer: Commercial Managed Care - HMO | Admitting: Neurology

## 2015-05-01 ENCOUNTER — Encounter: Payer: Self-pay | Admitting: Neurology

## 2015-05-01 VITALS — BP 145/69 | HR 76 | Ht 70.0 in | Wt 238.5 lb

## 2015-05-01 DIAGNOSIS — R05 Cough: Secondary | ICD-10-CM

## 2015-05-01 DIAGNOSIS — F05 Delirium due to known physiological condition: Secondary | ICD-10-CM | POA: Diagnosis not present

## 2015-05-01 DIAGNOSIS — R1310 Dysphagia, unspecified: Secondary | ICD-10-CM | POA: Diagnosis not present

## 2015-05-01 DIAGNOSIS — R054 Cough syncope: Secondary | ICD-10-CM

## 2015-05-01 DIAGNOSIS — R269 Unspecified abnormalities of gait and mobility: Secondary | ICD-10-CM | POA: Insufficient documentation

## 2015-05-01 DIAGNOSIS — E538 Deficiency of other specified B group vitamins: Secondary | ICD-10-CM

## 2015-05-01 DIAGNOSIS — R410 Disorientation, unspecified: Secondary | ICD-10-CM

## 2015-05-01 DIAGNOSIS — R55 Syncope and collapse: Secondary | ICD-10-CM | POA: Diagnosis not present

## 2015-05-01 HISTORY — DX: Unspecified abnormalities of gait and mobility: R26.9

## 2015-05-01 HISTORY — DX: Syncope and collapse: R55

## 2015-05-01 HISTORY — DX: Deficiency of other specified B group vitamins: E53.8

## 2015-05-01 HISTORY — DX: Cough syncope: R05.4

## 2015-05-01 MED ORDER — CYANOCOBALAMIN 1000 MCG/ML IJ SOLN
1000.0000 ug | Freq: Once | INTRAMUSCULAR | Status: AC
  Administered 2015-05-01: 1000 ug via INTRAMUSCULAR

## 2015-05-01 NOTE — Patient Instructions (Addendum)
Start B12 tablets, 1000 mcg daily. We will get speech therapy to follow you and get an EEG study.   Fall Prevention in the Home  Falls can cause injuries and can affect people from all age groups. There are many simple things that you can do to make your home safe and to help prevent falls. WHAT CAN I DO ON THE OUTSIDE OF MY HOME?  Regularly repair the edges of walkways and driveways and fix any cracks.  Remove high doorway thresholds.  Trim any shrubbery on the main path into your home.  Use bright outdoor lighting.  Clear walkways of debris and clutter, including tools and rocks.  Regularly check that handrails are securely fastened and in good repair. Both sides of any steps should have handrails.  Install guardrails along the edges of any raised decks or porches.  Have leaves, snow, and ice cleared regularly.  Use sand or salt on walkways during winter months.  In the garage, clean up any spills right away, including grease or oil spills. WHAT CAN I DO IN THE BATHROOM?  Use night lights.  Install grab bars by the toilet and in the tub and shower. Do not use towel bars as grab bars.  Use non-skid mats or decals on the floor of the tub or shower.  If you need to sit down while you are in the shower, use a plastic, non-slip stool.Marland Kitchen  Keep the floor dry. Immediately clean up any water that spills on the floor.  Remove soap buildup in the tub or shower on a regular basis.  Attach bath mats securely with double-sided non-slip rug tape.  Remove throw rugs and other tripping hazards from the floor. WHAT CAN I DO IN THE BEDROOM?  Use night lights.  Make sure that a bedside light is easy to reach.  Do not use oversized bedding that drapes onto the floor.  Have a firm chair that has side arms to use for getting dressed.  Remove throw rugs and other tripping hazards from the floor. WHAT CAN I DO IN THE KITCHEN?   Clean up any spills right away.  Avoid walking on  wet floors.  Place frequently used items in easy-to-reach places.  If you need to reach for something above you, use a sturdy step stool that has a grab bar.  Keep electrical cables out of the way.  Do not use floor polish or wax that makes floors slippery. If you have to use wax, make sure that it is non-skid floor wax.  Remove throw rugs and other tripping hazards from the floor. WHAT CAN I DO IN THE STAIRWAYS?  Do not leave any items on the stairs.  Make sure that there are handrails on both sides of the stairs. Fix handrails that are broken or loose. Make sure that handrails are as long as the stairways.  Check any carpeting to make sure that it is firmly attached to the stairs. Fix any carpet that is loose or worn.  Avoid having throw rugs at the top or bottom of stairways, or secure the rugs with carpet tape to prevent them from moving.  Make sure that you have a light switch at the top of the stairs and the bottom of the stairs. If you do not have them, have them installed. WHAT ARE SOME OTHER FALL PREVENTION TIPS?  Wear closed-toe shoes that fit well and support your feet. Wear shoes that have rubber soles or low heels.  When you use a  stepladder, make sure that it is completely opened and that the sides are firmly locked. Have someone hold the ladder while you are using it. Do not climb a closed stepladder.  Add color or contrast paint or tape to grab bars and handrails in your home. Place contrasting color strips on the first and last steps.  Use mobility aids as needed, such as canes, walkers, scooters, and crutches.  Turn on lights if it is dark. Replace any light bulbs that burn out.  Set up furniture so that there are clear paths. Keep the furniture in the same spot.  Fix any uneven floor surfaces.  Choose a carpet design that does not hide the edge of steps of a stairway.  Be aware of any and all pets.  Review your medicines with your healthcare provider. Some  medicines can cause dizziness or changes in blood pressure, which increase your risk of falling. Talk with your health care provider about other ways that you can decrease your risk of falls. This may include working with a physical therapist or trainer to improve your strength, balance, and endurance.   This information is not intended to replace advice given to you by your health care provider. Make sure you discuss any questions you have with your health care provider.   Document Released: 04/25/2002 Document Revised: 09/19/2014 Document Reviewed: 06/09/2014 Elsevier Interactive Patient Education [employer].

## 2015-05-01 NOTE — Progress Notes (Signed)
Reason for visit: Memory disturbance, confusion  Referring physician: Dr. Si Raider is a 74 y.o. male  History of present illness:  Mr. Srinivas is a 74 year old left-handed black male with a history of diabetes and hypertension. The patient has COPD, he recently was in the hospital around 04/18/2015 with an exacerbation of his COPD. The patient was noted to have significantly elevated white blood count of greater than 20,000. The patient has some history of memory issues that date back at least 2 years, this has gradually worsened over time, the patient has become more forgetful. He has had episodes of inappropriate crying episodes, without reports of depression that occurred over the last month or 2. He is having increasing problems with walking, he reports that he gets fatigued in the hips and knees, with some discomfort in hips and knees with walking. He gets short winded with ambulation, and his legs will tend to give out. The patient has fallen on occasion. The patient has also begun having problems with swallowing over the last 2 months, he is choking on liquids and solids. The patient will get into coughing episodes, and with these events, he may black out. These events have been witnessed, the patient denies any palpitations of the heart, or chest pain with these episodes. The patient will begin to cough frequently, then he will start to lose consciousness. The patient will not jerk or twitch, he does not bite his tongue or lose control of the bowels or the bladder. He will regain consciousness rapidly. The patient has undergone a swallowing evaluation that shows some penetration with swallowing. He was told to go on thickened liquids, he has not done this. He is not actively followed by speech therapist. The patient is having frequent events of loss of consciousness. He is not operating a motor vehicle at this time because of this. He has had recent blood work that shows a  low vitamin B12 level that was done on 04/19/2015. He has not received B12 supplementation. CT of the brain shows evidence of bilateral thalamic stroke events, the event on the left side is in the medial aspect of the thalamus. The patient has been set up for a MRI of the brain, and a carotid Doppler study, he is on aspirin therapy. He is sent to this office for an evaluation.  Past Medical History  Diagnosis Date  . GERD (gastroesophageal reflux disease)   . CAD (coronary artery disease)   . Obesity   . Hyperlipidemia   . Hypertension   . Chronic back pain   . Gout   . Chronic bronchitis ([place])     "get it q yr"  . Psoriasis   . COPD (chronic obstructive pulmonary disease) ([place])   . Myocardial infarction ([place]) 1999  . Pneumonia     "a few times; last time was ~ 06/2013"  . On home oxygen therapy     "2L; only at night" (08/30/2013)  . Diabetes mellitus, type 1   . Arthritis of knee   . Arthritis     "left leg" (08/30/2013)  . Vitamin B12 deficiency 05/01/2015  . Abnormality of gait 05/01/2015  . Tussive syncope 05/01/2015    Past Surgical History  Procedure Laterality Date  . Appendectomy    . Colonoscopy  03/17/2012    Procedure: COLONOSCOPY;  Surgeon: Rogene Houston, MD;  [facility];  Service: Endoscopy;  Laterality: N/A;  830  . Cystoscopy with urethral dilatation  N/A 11/26/2012    Procedure: CYSTOSCOPY WITH URETHRAL DILATATION;  Surgeon: Malka So, MD;  Location: [facility] ORS;  Service: Urology;  Laterality: N/A;  . Coronary angioplasty with stent placement  1999- 2009-08/30/2013    "counting today's, I have 5 stents" (08/30/2013)  . Left heart catheterization with coronary angiogram N/A 08/30/2013    Procedure: LEFT HEART CATHETERIZATION WITH CORONARY ANGIOGRAM;  Surgeon: Clent Demark, MD;  Location: [facility] CATH LAB;  Service: Cardiovascular;  Laterality: N/A;  . Percutaneous coronary stent intervention (pci-s) Right 08/30/2013    Procedure: PERCUTANEOUS CORONARY STENT  INTERVENTION (PCI-S);  Surgeon: Clent Demark, MD;  Location: [facility] CATH LAB;  Service: Cardiovascular;  Laterality: Right;    Family History  Problem Relation Age of Onset  . Hypertension Mother   . Diabetes Mother   . Hypertension Father   . Diabetes Brother   . Arthritis      Family History   . Diabetes      family History   . Stroke Daughter     Social history:  reports that he has quit smoking. His smoking use included Cigarettes. He has a 16.5 pack-year smoking history. He has never used smokeless tobacco. He reports that he does not drink alcohol or use illicit drugs.  Medications:  Prior to Admission medications   Medication Sig Start Date End Date Taking? Authorizing Provider  acetaminophen (TYLENOL) 500 MG tablet Take 500 mg by mouth every 6 (six) hours as needed for moderate pain (left thigh).   Yes Historical Provider, MD  albuterol (PROVENTIL HFA;VENTOLIN HFA) 108 (90 BASE) MCG/ACT inhaler Inhale 1-2 puffs into the lungs every 6 (six) hours as needed for wheezing or shortness of breath. 11/13/14  Yes Nat Christen, MD  allopurinol (ZYLOPRIM) 300 MG tablet Take 300 mg by mouth daily. 05/02/13  Yes Historical Provider, MD  amLODipine (NORVASC) 10 MG tablet Take 1 tablet (10 mg total) by mouth daily. 09/20/14  Yes Fayrene Helper, MD  aspirin 81 MG tablet Take 81 mg by mouth daily.   Yes Historical Provider, MD  insulin NPH-regular Human (NOVOLIN 70/30) (70-30) 100 UNIT/ML injection Inject 60 Units into the skin 2 (two) times daily with a meal. 04/20/15  Yes Estela Leonie Green, MD  levothyroxine (SYNTHROID, LEVOTHROID) 75 MCG tablet Take 75 mcg by mouth every morning.    Yes Historical Provider, MD  lisinopril (PRINIVIL,ZESTRIL) 40 MG tablet Take 1 tablet (40 mg total) by mouth daily. 09/20/14  Yes Fayrene Helper, MD  metFORMIN (GLUCOPHAGE) 500 MG tablet Take 1 tablet (500 mg total) by mouth 2 (two) times daily. 09/02/13  Yes Charolette Forward, MD  metoprolol (LOPRESSOR) 50 MG  tablet Take 1 tablet (50 mg total) by mouth 2 (two) times daily. 12/25/14  Yes Fayrene Helper, MD  pravastatin (PRAVACHOL) 40 MG tablet Take 1 tablet (40 mg total) by mouth daily. 09/20/14  Yes Fayrene Helper, MD  spironolactone (ALDACTONE) 25 MG tablet Take 1 tablet (25 mg total) by mouth daily. 12/25/14  Yes Fayrene Helper, MD     No Known Allergies  ROS:  Out of a complete 14 system review of symptoms, the patient complains only of the following symptoms, and all other reviewed systems are negative.  Swelling in the legs Difficulty swallowing Shortness of breath, cough, wheezing Syncope Memory disturbance  Blood pressure 145/69, pulse 76, height 5\' 10"  (1.778 m), weight 238 lb 8 oz (108.183 kg).  Physical Exam  General: The patient is alert  and cooperative at the time of the examination. The patient is moderately obese.  Eyes: Pupils are equal, round, and reactive to light. Discs are flat bilaterally.  Neck: The neck is supple, no carotid bruits are noted.  Respiratory: The respiratory examination is clear.  Cardiovascular: The cardiovascular examination reveals a regular rate and rhythm, no obvious murmurs or rubs are noted.  Skin: Extremities are with 1+ edema below the knees bilaterally.  Neurologic Exam  Mental status: The patient is alert and oriented x 3 at the time of the examination. The patient has apparent normal recent and remote memory, with an apparently normal attention span and concentration ability. Mini-Mental Status Examination done today shows a total score 27/30. The patient is able to name 17 animals in 30 seconds.  Cranial nerves: Facial symmetry is present. There is good sensation of the face to pinprick and soft touch bilaterally. The strength of the facial muscles and the muscles to head turning and shoulder shrug are normal bilaterally. Speech is well enunciated, no aphasia or dysarthria is noted. Extraocular movements are full. Visual fields  are full. The tongue is midline, and the patient has symmetric elevation of the soft palate. No obvious hearing deficits are noted.  Motor: The motor testing reveals 5 over 5 strength of all 4 extremities. Good symmetric motor tone is noted throughout.  Sensory: Sensory testing is intact to pinprick, soft touch, vibration sensation, and position sense on all 4 extremities, with the exception that there is a stocking pattern pinprick sensory deficit up to the knees bilaterally, and vibration sensation is decreased on the right foot compared to the left. No evidence of extinction is noted.  Coordination: Cerebellar testing reveals good finger-nose-finger and heel-to-shin bilaterally.  Gait and station: Gait is normal. Tandem gait is minimally unsteady. Romberg is negative. No drift is seen.  Reflexes: Deep tendon reflexes are symmetric and normal bilaterally, with exception that the right biceps reflex is decreased relative to the left, and the left ankle jerk reflex is decreased. Toes are downgoing bilaterally.   CT head 04/23/15:  IMPRESSION: No acute intracranial abnormality. Chronic lacunar infarcts in the bilateral thalami.  * CT scan images were reviewed online. I agree with the written report. Left thalamic infarct is medial.   Assessment/Plan:  1. Diabetes  2. COPD  3. Tussive syncope  4. Mild memory disturbance  5. Gait disturbance  6. Cerebrovascular disease by CT  7. Vitamin B12 deficiency  8. Dysphagia  The patient has multiple issues. He is having significant issues with dysphagia, he is coughing with this, and this is resulting in tussive syncope. The patient has a mild underlying memory disturbance, he appears to have a pseudobulbar affect with inappropriate crying episodes. He does have a vitamin B12 deficiency, he will receive a vitamin B12 injection today, he will go on oral supplementation of 1000 g daily. He does have a history of cerebrovascular disease, the  CT shows a medial left thalamic infarct that may impair memory. MRI of the brain will be done in the future, and a carotid Doppler study will be done. He is to remain on aspirin. The patient will be set up for a speech therapy evaluation to follow him for the dysphagia, and to ensure that he is doing the appropriate things at home to reduce the risk of aspiration. The patient reports no significant episodes of hypo-or hyperglycemia. The balance issue seems to be minimal at this time, he reports fatigue issues that seem to parallel problems  with shortness of breath with walking, and discomfort in the hips and knees. He will follow-up in 3-4 months. We will check an EEG study.  Jill Alexanders MD 05/01/2015 8:46 AM  [facility] [address] [place], [place] [address]  Phone [phone] Fax [phone]

## 2015-05-02 ENCOUNTER — Encounter: Payer: Self-pay | Admitting: *Deleted

## 2015-05-02 ENCOUNTER — Other Ambulatory Visit: Payer: Self-pay | Admitting: *Deleted

## 2015-05-02 LAB — RPR: RPR Ser Ql: NONREACTIVE

## 2015-05-02 LAB — COPPER, SERUM: Copper: 122 ug/dL (ref 72–166)

## 2015-05-02 NOTE — Patient Outreach (Signed)
Call to patient for transition of care week #2  Spoke with both patient and wife. Patient has lots of upcoming appointments to evaluate why his memory is failing, saw neurologist, he added vitamin B12, thinks that patient may have had a stroke.  Patient has some upcoming tests also. Patient reports wife is assisting him with appointments and any changes. At this time, patient reports not many answers and not concerns for [facility], just hoping to get some answers soon.  Plan to continue transition of care calls Patient will keep appointments, will call RNCM with any questions or concerns.  Royetta Crochet. Laymond Purser, RN, BSN, [place] [phone]

## 2015-05-04 ENCOUNTER — Other Ambulatory Visit (HOSPITAL_COMMUNITY): Payer: Commercial Managed Care - HMO

## 2015-05-04 ENCOUNTER — Encounter (HOSPITAL_COMMUNITY): Payer: Commercial Managed Care - HMO

## 2015-05-04 DIAGNOSIS — I252 Old myocardial infarction: Secondary | ICD-10-CM | POA: Diagnosis not present

## 2015-05-04 DIAGNOSIS — J439 Emphysema, unspecified: Secondary | ICD-10-CM | POA: Diagnosis not present

## 2015-05-04 DIAGNOSIS — I2510 Atherosclerotic heart disease of native coronary artery without angina pectoris: Secondary | ICD-10-CM | POA: Diagnosis not present

## 2015-05-04 DIAGNOSIS — Z8673 Personal history of transient ischemic attack (TIA), and cerebral infarction without residual deficits: Secondary | ICD-10-CM | POA: Diagnosis not present

## 2015-05-04 DIAGNOSIS — E669 Obesity, unspecified: Secondary | ICD-10-CM | POA: Diagnosis not present

## 2015-05-04 DIAGNOSIS — Z0000 Encounter for general adult medical examination without abnormal findings: Secondary | ICD-10-CM | POA: Diagnosis not present

## 2015-05-04 DIAGNOSIS — E1142 Type 2 diabetes mellitus with diabetic polyneuropathy: Secondary | ICD-10-CM | POA: Diagnosis not present

## 2015-05-04 DIAGNOSIS — E784 Other hyperlipidemia: Secondary | ICD-10-CM | POA: Diagnosis not present

## 2015-05-07 ENCOUNTER — Encounter (HOSPITAL_COMMUNITY)
Admission: RE | Admit: 2015-05-07 | Discharge: 2015-05-07 | Disposition: A | Discharge status: Home / Self Care | Payer: Commercial Managed Care - HMO | Source: Ambulatory Visit | Attending: Cardiology | Admitting: Cardiology

## 2015-05-07 DIAGNOSIS — E669 Obesity, unspecified: Secondary | ICD-10-CM | POA: Diagnosis not present

## 2015-05-07 DIAGNOSIS — R0609 Other forms of dyspnea: Secondary | ICD-10-CM | POA: Diagnosis not present

## 2015-05-07 DIAGNOSIS — R079 Chest pain, unspecified: Secondary | ICD-10-CM | POA: Insufficient documentation

## 2015-05-07 DIAGNOSIS — I119 Hypertensive heart disease without heart failure: Secondary | ICD-10-CM | POA: Diagnosis not present

## 2015-05-07 DIAGNOSIS — E785 Hyperlipidemia, unspecified: Secondary | ICD-10-CM | POA: Diagnosis not present

## 2015-05-07 DIAGNOSIS — E119 Type 2 diabetes mellitus without complications: Secondary | ICD-10-CM | POA: Diagnosis not present

## 2015-05-07 DIAGNOSIS — I252 Old myocardial infarction: Secondary | ICD-10-CM | POA: Diagnosis not present

## 2015-05-07 DIAGNOSIS — I10 Essential (primary) hypertension: Secondary | ICD-10-CM | POA: Diagnosis not present

## 2015-05-07 DIAGNOSIS — I2510 Atherosclerotic heart disease of native coronary artery without angina pectoris: Secondary | ICD-10-CM | POA: Diagnosis not present

## 2015-05-07 LAB — HEPATIC FUNCTION PANEL
ALBUMIN: 3.4 g/dL — AB (ref 3.5–5.0)
ALT: 10 U/L — ABNORMAL LOW (ref 17–63)
AST: 16 U/L (ref 15–41)
Alkaline Phosphatase: 67 U/L (ref 38–126)
Bilirubin, Direct: 0.1 mg/dL (ref 0.1–0.5)
Indirect Bilirubin: 0.5 mg/dL (ref 0.3–0.9)
TOTAL PROTEIN: 6.9 g/dL (ref 6.5–8.1)
Total Bilirubin: 0.6 mg/dL (ref 0.3–1.2)

## 2015-05-07 LAB — BASIC METABOLIC PANEL
Anion gap: 9 (ref 5–15)
BUN: 16 mg/dL (ref 6–20)
CALCIUM: 9.6 mg/dL (ref 8.9–10.3)
CHLORIDE: 104 mmol/L (ref 101–111)
CO2: 25 mmol/L (ref 22–32)
CREATININE: 1.19 mg/dL (ref 0.61–1.24)
GFR calc non Af Amer: 59 mL/min — ABNORMAL LOW (ref >60)
Glucose, Bld: 102 mg/dL — ABNORMAL HIGH (ref 65–99)
Potassium: 4.8 mmol/L (ref 3.5–5.1)
SODIUM: 138 mmol/L (ref 135–145)

## 2015-05-07 LAB — LIPID PANEL
CHOL/HDL RATIO: 6.1 ratio
CHOLESTEROL: 147 mg/dL (ref 0–200)
HDL: 24 mg/dL — AB (ref >40)
LDL Cholesterol: 78 mg/dL (ref 0–99)
TRIGLYCERIDES: 223 mg/dL — AB (ref <150)
VLDL: 45 mg/dL — AB (ref 0–40)

## 2015-05-07 LAB — TSH: TSH: 1.312 u[IU]/mL (ref 0.350–4.500)

## 2015-05-07 MED ORDER — TECHNETIUM TC 99M SESTAMIBI GENERIC - CARDIOLITE
10.0000 | Freq: Once | INTRAVENOUS | Status: AC | PRN
  Administered 2015-05-07: 10 via INTRAVENOUS

## 2015-05-07 MED ORDER — REGADENOSON 0.4 MG/5ML IV SOLN
0.4000 mg | Freq: Once | INTRAVENOUS | Status: AC
  Administered 2015-05-07: 0.4 mg via INTRAVENOUS

## 2015-05-07 MED ORDER — TECHNETIUM TC 99M SESTAMIBI GENERIC - CARDIOLITE
30.0000 | Freq: Once | INTRAVENOUS | Status: AC | PRN
  Administered 2015-05-07: 30 via INTRAVENOUS

## 2015-05-07 MED ORDER — REGADENOSON 0.4 MG/5ML IV SOLN
INTRAVENOUS | Status: AC
  Administered 2015-05-07: 0.4 mg via INTRAVENOUS
  Filled 2015-05-07: qty 5

## 2015-05-08 LAB — NM MYOCAR MULTI W/SPECT W/WALL MOTION / EF
CHL CUP STRESS STAGE 1 GRADE: 0 %
CHL CUP STRESS STAGE 1 HR: 83 {beats}/min
CHL CUP STRESS STAGE 1 SBP: 149 mmHg
CHL CUP STRESS STAGE 1 SPEED: 0 mph
CHL CUP STRESS STAGE 3 DBP: 74 mmHg
CHL CUP STRESS STAGE 3 SPEED: 0 mph
CHL CUP STRESS STAGE 4 HR: 94 {beats}/min
CHL CUP STRESS STAGE 4 SPEED: 0 mph
CHL CUP STRESS STAGE 5 GRADE: 0 %
CHL CUP STRESS STAGE 5 SBP: 171 mmHg
CHL CUP STRESS STAGE 5 SPEED: 0 mph
CSEPEW: 1 METS
CSEPPMHR: 65 %
Peak BP: 171 mmHg
Peak HR: 96 {beats}/min
Stage 1 DBP: 75 mmHg
Stage 2 Grade: 0 %
Stage 2 HR: 83 {beats}/min
Stage 2 Speed: 0 mph
Stage 3 Grade: 0 %
Stage 3 HR: 102 {beats}/min
Stage 3 SBP: 165 mmHg
Stage 4 DBP: 68 mmHg
Stage 4 Grade: 0 %
Stage 4 SBP: 163 mmHg
Stage 5 DBP: 64 mmHg
Stage 5 HR: 96 {beats}/min

## 2015-05-08 LAB — HEMOGLOBIN A1C
HEMOGLOBIN A1C: 7.1 % — AB (ref 4.8–5.6)
MEAN PLASMA GLUCOSE: 157 mg/dL

## 2015-05-09 ENCOUNTER — Inpatient Hospital Stay (HOSPITAL_COMMUNITY)
Admission: EM | Admit: 2015-05-09 | Discharge: 2015-05-12 | DRG: 065 | Disposition: A | Discharge status: Home Health | Payer: Commercial Managed Care - HMO | Attending: Internal Medicine | Admitting: Internal Medicine

## 2015-05-09 ENCOUNTER — Emergency Department (HOSPITAL_COMMUNITY): Payer: Commercial Managed Care - HMO

## 2015-05-09 ENCOUNTER — Encounter (HOSPITAL_COMMUNITY): Payer: Self-pay

## 2015-05-09 ENCOUNTER — Other Ambulatory Visit: Payer: Self-pay

## 2015-05-09 ENCOUNTER — Encounter: Payer: Commercial Managed Care - HMO | Admitting: Family Medicine

## 2015-05-09 ENCOUNTER — Other Ambulatory Visit: Payer: Self-pay | Admitting: *Deleted

## 2015-05-09 DIAGNOSIS — D72829 Elevated white blood cell count, unspecified: Secondary | ICD-10-CM | POA: Diagnosis present

## 2015-05-09 DIAGNOSIS — E875 Hyperkalemia: Secondary | ICD-10-CM | POA: Diagnosis present

## 2015-05-09 DIAGNOSIS — J441 Chronic obstructive pulmonary disease with (acute) exacerbation: Secondary | ICD-10-CM | POA: Diagnosis present

## 2015-05-09 DIAGNOSIS — G4089 Other seizures: Secondary | ICD-10-CM | POA: Diagnosis present

## 2015-05-09 DIAGNOSIS — F482 Pseudobulbar affect: Secondary | ICD-10-CM | POA: Diagnosis present

## 2015-05-09 DIAGNOSIS — E1142 Type 2 diabetes mellitus with diabetic polyneuropathy: Secondary | ICD-10-CM | POA: Diagnosis present

## 2015-05-09 DIAGNOSIS — D649 Anemia, unspecified: Secondary | ICD-10-CM | POA: Diagnosis present

## 2015-05-09 DIAGNOSIS — I252 Old myocardial infarction: Secondary | ICD-10-CM | POA: Diagnosis not present

## 2015-05-09 DIAGNOSIS — Z833 Family history of diabetes mellitus: Secondary | ICD-10-CM

## 2015-05-09 DIAGNOSIS — R269 Unspecified abnormalities of gait and mobility: Secondary | ICD-10-CM | POA: Diagnosis not present

## 2015-05-09 DIAGNOSIS — I6320 Cerebral infarction due to unspecified occlusion or stenosis of unspecified precerebral arteries: Secondary | ICD-10-CM | POA: Diagnosis not present

## 2015-05-09 DIAGNOSIS — M1990 Unspecified osteoarthritis, unspecified site: Secondary | ICD-10-CM | POA: Diagnosis present

## 2015-05-09 DIAGNOSIS — R079 Chest pain, unspecified: Secondary | ICD-10-CM | POA: Diagnosis not present

## 2015-05-09 DIAGNOSIS — I2510 Atherosclerotic heart disease of native coronary artery without angina pectoris: Secondary | ICD-10-CM | POA: Diagnosis present

## 2015-05-09 DIAGNOSIS — I10 Essential (primary) hypertension: Secondary | ICD-10-CM | POA: Diagnosis present

## 2015-05-09 DIAGNOSIS — Z8249 Family history of ischemic heart disease and other diseases of the circulatory system: Secondary | ICD-10-CM

## 2015-05-09 DIAGNOSIS — E039 Hypothyroidism, unspecified: Secondary | ICD-10-CM | POA: Diagnosis present

## 2015-05-09 DIAGNOSIS — Z9981 Dependence on supplemental oxygen: Secondary | ICD-10-CM | POA: Diagnosis not present

## 2015-05-09 DIAGNOSIS — I639 Cerebral infarction, unspecified: Principal | ICD-10-CM | POA: Diagnosis present

## 2015-05-09 DIAGNOSIS — E782 Mixed hyperlipidemia: Secondary | ICD-10-CM | POA: Diagnosis present

## 2015-05-09 DIAGNOSIS — E1165 Type 2 diabetes mellitus with hyperglycemia: Secondary | ICD-10-CM | POA: Diagnosis present

## 2015-05-09 DIAGNOSIS — E1059 Type 1 diabetes mellitus with other circulatory complications: Secondary | ICD-10-CM | POA: Diagnosis not present

## 2015-05-09 DIAGNOSIS — R569 Unspecified convulsions: Secondary | ICD-10-CM

## 2015-05-09 DIAGNOSIS — Z823 Family history of stroke: Secondary | ICD-10-CM

## 2015-05-09 DIAGNOSIS — Z794 Long term (current) use of insulin: Secondary | ICD-10-CM

## 2015-05-09 DIAGNOSIS — N179 Acute kidney failure, unspecified: Secondary | ICD-10-CM | POA: Diagnosis present

## 2015-05-09 DIAGNOSIS — R062 Wheezing: Secondary | ICD-10-CM | POA: Diagnosis not present

## 2015-05-09 DIAGNOSIS — R410 Disorientation, unspecified: Secondary | ICD-10-CM | POA: Diagnosis not present

## 2015-05-09 DIAGNOSIS — E538 Deficiency of other specified B group vitamins: Secondary | ICD-10-CM | POA: Diagnosis present

## 2015-05-09 DIAGNOSIS — I6789 Other cerebrovascular disease: Secondary | ICD-10-CM | POA: Diagnosis not present

## 2015-05-09 DIAGNOSIS — I6350 Cerebral infarction due to unspecified occlusion or stenosis of unspecified cerebral artery: Secondary | ICD-10-CM

## 2015-05-09 DIAGNOSIS — K219 Gastro-esophageal reflux disease without esophagitis: Secondary | ICD-10-CM | POA: Diagnosis present

## 2015-05-09 DIAGNOSIS — G4733 Obstructive sleep apnea (adult) (pediatric): Secondary | ICD-10-CM | POA: Diagnosis present

## 2015-05-09 DIAGNOSIS — R531 Weakness: Secondary | ICD-10-CM | POA: Diagnosis present

## 2015-05-09 DIAGNOSIS — G8191 Hemiplegia, unspecified affecting right dominant side: Secondary | ICD-10-CM | POA: Diagnosis present

## 2015-05-09 DIAGNOSIS — R4781 Slurred speech: Secondary | ICD-10-CM | POA: Diagnosis not present

## 2015-05-09 DIAGNOSIS — Z87891 Personal history of nicotine dependence: Secondary | ICD-10-CM

## 2015-05-09 DIAGNOSIS — I6523 Occlusion and stenosis of bilateral carotid arteries: Secondary | ICD-10-CM | POA: Diagnosis not present

## 2015-05-09 DIAGNOSIS — E785 Hyperlipidemia, unspecified: Secondary | ICD-10-CM | POA: Diagnosis present

## 2015-05-09 NOTE — Patient Outreach (Signed)
Transition of Care week #3  Spoke with wife, she reports patient is doing well. She states he has some "ups and downs" They are still going to some appointments for specialist and for some tests. Nothing definitive at this point. She voices no new issues or concerns for this call. Plan to continue Transition of care program  Wife or patient to contact RNCM with new issues or concerns. Will visit in January Mary E. Laymond Purser, RN, BSN, [place] [phone]

## 2015-05-09 NOTE — ED Provider Notes (Signed)
CSN: [id number]     Arrival date & time 05/09/15  2320 History  By signing my name below, I, Jolayne Panther, attest that this documentation has been prepared under the direction and in the presence of Delora Fuel, MD. Electronically Signed: Jolayne Panther, Scribe. 05/09/2015. 11:40 PM.    Chief Complaint  Patient presents with  . Weakness    The history is provided by the patient. No language interpreter was used.   HPI Comments: [name] is a 74 y.o. male who presents to the Emergency Department complaining of weakness in his right arm and right leg with radiation down his right side onset yesterday around 3:00 PM. Pt reports that by 6:00 PM he was able to walk normally again. When he woke up at 7 AM, he was able to walk normally. He also reports that around 1:00 PM this afternoon he began dragging his right foot. Last time he remembers being able to walk normally was when he woke up at 7 AM. Pt also reports mild dysphasia and numbness in his right arm and leg. Pt denies HA, visual disturbance, chest pain, chest tightness, nausea, vomiting, and diarrhea.   Past Medical History  Diagnosis Date  . GERD (gastroesophageal reflux disease)   . CAD (coronary artery disease)   . Obesity   . Hyperlipidemia   . Hypertension   . Chronic back pain   . Gout   . Chronic bronchitis ([place])     "get it q yr"  . Psoriasis   . COPD (chronic obstructive pulmonary disease) ([place])   . Myocardial infarction ([place]) 1999  . Pneumonia     "a few times; last time was ~ 06/2013"  . On home oxygen therapy     "2L; only at night" (08/30/2013)  . Diabetes mellitus, type 1   . Arthritis of knee   . Arthritis     "left leg" (08/30/2013)  . Vitamin B12 deficiency 05/01/2015  . Abnormality of gait 05/01/2015  . Tussive syncope 05/01/2015   Past Surgical History  Procedure Laterality Date  . Appendectomy    . Colonoscopy  03/17/2012    Procedure: COLONOSCOPY;  Surgeon: Rogene Houston, MD;   Location: [facility];  Service: Endoscopy;  Laterality: N/A;  830  . Cystoscopy with urethral dilatation N/A 11/26/2012    Procedure: CYSTOSCOPY WITH URETHRAL DILATATION;  Surgeon: Malka So, MD;  Location: [facility] ORS;  Service: Urology;  Laterality: N/A;  . Coronary angioplasty with stent placement  1999- 2009-08/30/2013    "counting today's, I have 5 stents" (08/30/2013)  . Left heart catheterization with coronary angiogram N/A 08/30/2013    Procedure: LEFT HEART CATHETERIZATION WITH CORONARY ANGIOGRAM;  Surgeon: Clent Demark, MD;  Location: [facility] CATH LAB;  Service: Cardiovascular;  Laterality: N/A;  . Percutaneous coronary stent intervention (pci-s) Right 08/30/2013    Procedure: PERCUTANEOUS CORONARY STENT INTERVENTION (PCI-S);  Surgeon: Clent Demark, MD;  Location: [facility] CATH LAB;  Service: Cardiovascular;  Laterality: Right;   Family History  Problem Relation Age of Onset  . Hypertension Mother   . Diabetes Mother   . Hypertension Father   . Diabetes Brother   . Arthritis      Family History   . Diabetes      family History   . Stroke Daughter    Social History  Substance Use Topics  . Smoking status: Former Smoker -- 0.50 packs/day for 33 years    Types: Cigarettes  . Smokeless  tobacco: Never Used     Comment: "stopped smoking in the 1990's  . Alcohol Use: No    Review of Systems  Eyes: Negative for visual disturbance.  Respiratory: Negative for chest tightness.   Cardiovascular: Negative for chest pain.  Gastrointestinal: Negative for nausea, vomiting and diarrhea.  Neurological: Positive for weakness and numbness. Negative for headaches.  All other systems reviewed and are negative.  Allergies  Review of patient's allergies indicates no known allergies.  Home Medications   Prior to Admission medications   Medication Sig Start Date End Date Taking? Authorizing Provider  acetaminophen (TYLENOL) 500 MG tablet Take 500 mg by mouth every 6 (six) hours as needed for  moderate pain (left thigh).    Historical Provider, MD  albuterol (PROVENTIL HFA;VENTOLIN HFA) 108 (90 BASE) MCG/ACT inhaler Inhale 1-2 puffs into the lungs every 6 (six) hours as needed for wheezing or shortness of breath. 11/13/14   Nat Christen, MD  allopurinol (ZYLOPRIM) 300 MG tablet Take 300 mg by mouth daily. 05/02/13   Historical Provider, MD  amLODipine (NORVASC) 10 MG tablet Take 1 tablet (10 mg total) by mouth daily. 09/20/14   Fayrene Helper, MD  aspirin 81 MG tablet Take 81 mg by mouth daily.    Historical Provider, MD  insulin NPH-regular Human (NOVOLIN 70/30) (70-30) 100 UNIT/ML injection Inject 60 Units into the skin 2 (two) times daily with a meal. 04/20/15   Erline Hau, MD  levothyroxine (SYNTHROID, LEVOTHROID) 75 MCG tablet Take 75 mcg by mouth every morning.     Historical Provider, MD  lisinopril (PRINIVIL,ZESTRIL) 40 MG tablet Take 1 tablet (40 mg total) by mouth daily. 09/20/14   Fayrene Helper, MD  metFORMIN (GLUCOPHAGE) 500 MG tablet Take 1 tablet (500 mg total) by mouth 2 (two) times daily. 09/02/13   Charolette Forward, MD  metoprolol (LOPRESSOR) 50 MG tablet Take 1 tablet (50 mg total) by mouth 2 (two) times daily. 12/25/14   Fayrene Helper, MD  pravastatin (PRAVACHOL) 40 MG tablet Take 1 tablet (40 mg total) by mouth daily. 09/20/14   Fayrene Helper, MD  spironolactone (ALDACTONE) 25 MG tablet Take 1 tablet (25 mg total) by mouth daily. 12/25/14   Fayrene Helper, MD  vitamin B-12 (CYANOCOBALAMIN) 1000 MCG tablet Take 1,000 mcg by mouth daily.    Historical Provider, MD   There were no vitals taken for this visit. Physical Exam  Constitutional: He is oriented to person, place, and time. He appears well-developed and well-nourished. No distress.  HENT:  Head: Normocephalic and atraumatic.  Eyes: Conjunctivae and EOM are normal. Pupils are equal, round, and reactive to light.  Neck: Normal range of motion. Neck supple. No JVD present.  No carotid bruits   Cardiovascular: Normal rate, regular rhythm and normal heart sounds.   No murmur heard. Pulmonary/Chest: Effort normal and breath sounds normal. He has no wheezes. He has no rales. He exhibits no tenderness.  Abdominal: Soft. Bowel sounds are normal. He exhibits no distension and no mass. There is no tenderness.  Musculoskeletal: Normal range of motion.  1+ pitting edema   Lymphadenopathy:    He has no cervical adenopathy.  Neurological: He is alert and oriented to person, place, and time. A cranial nerve deficit is present. Coordination abnormal.  Speech mildly dysarthric  No facial droop Tongue protrudes slightly to the right Decreased sensation right arm and right leg Normal sensation throughout the face Mild weakness right arm with strength 4/5  Severe weakness of right hip flexion 2/5 Moderate weakness of right leg muscles 3/5   Skin: Skin is warm and dry. No rash noted.  Psychiatric: He has a normal mood and affect. His behavior is normal. Thought content normal.  Nursing note and vitals reviewed.   ED Course  Procedures  DIAGNOSTIC STUDIES:    Oxygen Saturation is 94% on RA, adeqaute by my interpretation.   COORDINATION OF CARE:  11:30 PM Will order CT scan. Will review lab work. Will order MRI scans for tomorrow. Discussed treatment plan with pt at bedside and pt agreed to plan.   Labs Review Results for orders placed or performed during the hospital encounter of 05/09/15  Ethanol  Result Value Ref Range   Alcohol, Ethyl (B) <5 <5 mg/dL  Protime-INR  Result Value Ref Range   Prothrombin Time 14.5 11.6 - 15.2 seconds   INR 1.11 0.00 - 1.49  APTT  Result Value Ref Range   aPTT 33 24 - 37 seconds  CBC  Result Value Ref Range   WBC 15.5 (H) 4.0 - 10.5 K/uL   RBC 3.84 (L) 4.22 - 5.81 MIL/uL   Hemoglobin 10.4 (L) 13.0 - 17.0 g/dL   HCT 33.0 (L) 39.0 - 52.0 %   MCV 85.9 78.0 - 100.0 fL   MCH 27.1 26.0 - 34.0 pg   MCHC 31.5 30.0 - 36.0 g/dL   RDW 14.8 11.5 -  15.5 %   Platelets 297 150 - 400 K/uL  Differential  Result Value Ref Range   Neutrophils Relative % 71 %   Neutro Abs 11.0 (H) 1.7 - 7.7 K/uL   Lymphocytes Relative 21 %   Lymphs Abs 3.3 0.7 - 4.0 K/uL   Monocytes Relative 5 %   Monocytes Absolute 0.8 0.1 - 1.0 K/uL   Eosinophils Relative 2 %   Eosinophils Absolute 0.3 0.0 - 0.7 K/uL   Basophils Relative 1 %   Basophils Absolute 0.1 0.0 - 0.1 K/uL  Comprehensive metabolic panel  Result Value Ref Range   Sodium 136 135 - 145 mmol/L   Potassium 5.3 (H) 3.5 - 5.1 mmol/L   Chloride 103 101 - 111 mmol/L   CO2 27 22 - 32 mmol/L   Glucose, Bld 113 (H) 65 - 99 mg/dL   BUN 36 (H) 6 - 20 mg/dL   Creatinine, Ser 1.60 (H) 0.61 - 1.24 mg/dL   Calcium 9.2 8.9 - 10.3 mg/dL   Total Protein 7.6 6.5 - 8.1 g/dL   Albumin 3.7 3.5 - 5.0 g/dL   AST 14 (L) 15 - 41 U/L   ALT 13 (L) 17 - 63 U/L   Alkaline Phosphatase 72 38 - 126 U/L   Total Bilirubin 0.5 0.3 - 1.2 mg/dL   GFR calc non Af Amer 41 (L) >60 mL/min   GFR calc Af Amer 48 (L) >60 mL/min   Anion gap 6 5 - 15  Troponin I  Result Value Ref Range   Troponin I <0.03 <0.031 ng/mL   Imaging Review Ct Head Wo Contrast  05/10/2015  CLINICAL DATA:  74 year old male with right-sided weakness since started yesterday. EXAM: CT HEAD WITHOUT CONTRAST TECHNIQUE: Contiguous axial images were obtained from the base of the skull through the vertex without intravenous contrast. COMPARISON:  Head CT dated 06/23/2014 FINDINGS: There is slight prominence of the ventricles and sulci compatible with age-related volume loss. Mild periventricular and deep white matter hypodensities represent chronic microvascular ischemic changes. Small left basal ganglia old lacunar  infarct. There is no intracranial hemorrhage. No mass effect or midline shift identified. The visualized paranasal sinuses and mastoid air cells are well aerated. The calvarium is intact. IMPRESSION: No acute intracranial hemorrhage. Mild age-related  atrophy and chronic microvascular ischemic disease. If symptoms persist and there are no contraindications, MRI may provide better evaluation if clinically indicated Electronically Signed   By: Anner Crete M.D.   On: 05/10/2015 01:12   I have personally reviewed and evaluated these images and lab results as part of my medical decision-making.   EKG Interpretation   Date/Time:  Wednesday May 09 2015 23:27:17 EST Ventricular Rate:  87 PR Interval:  210 QRS Duration: 83 QT Interval:  351 QTC Calculation: 422 R Axis:   -9 Text Interpretation:  Sinus rhythm Nonspecific ST abnormality When  compared with ECG of 04/23/2015, No significant change was found Confirmed  by [facility]  MD, Sabriya Yono ([id number]) on 05/09/2015 11:37:39 PM      MDM   Final diagnoses:  Cerebrovascular accident (CVA) due to occlusion of cerebral artery ([place])  Acute kidney injury (nontraumatic) (HCC)  Normochromic normocytic anemia    Apparent left hemisphere stroke. Old records are reviewed and he has been seeing a neurologist. He had MRI of the brain and carotid ultrasound scheduled. He has been treated for B12 deficiency and has been noted to have pseudobulbar affect and has had some intermittent neurologic symptoms which have included waxing and waning levels of consciousness and episodes where he falls. He has had swallowing difficulty in the past and has failed his stroke swallow screen today. He is outside the code stroke window with last known normal being more than 16 hours ago. CT had shown prior thalamic infarcts. He will need to be admitted for stroke workup.  CT shows no acute changes and laboratory workup is significant for slight increase in creatinine and BUN over baseline. Chronic anemia is present and unchanged. Case is discussed with Dr. Arnoldo Morale of [facility] who agrees to admit the patient under observation status.  I personally performed the services described in this documentation, which was  scribed in my presence. The recorded information has been reviewed and is accurate.      Delora Fuel, MD [id number]

## 2015-05-09 NOTE — ED Notes (Signed)
Pt has been having intermittent weakness in right side since yesterday at 3 pm.

## 2015-05-10 ENCOUNTER — Inpatient Hospital Stay (HOSPITAL_COMMUNITY): Payer: Commercial Managed Care - HMO

## 2015-05-10 ENCOUNTER — Encounter (HOSPITAL_COMMUNITY): Payer: Self-pay | Admitting: *Deleted

## 2015-05-10 DIAGNOSIS — G8191 Hemiplegia, unspecified affecting right dominant side: Secondary | ICD-10-CM | POA: Diagnosis present

## 2015-05-10 DIAGNOSIS — Z87891 Personal history of nicotine dependence: Secondary | ICD-10-CM | POA: Diagnosis not present

## 2015-05-10 DIAGNOSIS — E785 Hyperlipidemia, unspecified: Secondary | ICD-10-CM

## 2015-05-10 DIAGNOSIS — Z823 Family history of stroke: Secondary | ICD-10-CM | POA: Diagnosis not present

## 2015-05-10 DIAGNOSIS — E1165 Type 2 diabetes mellitus with hyperglycemia: Secondary | ICD-10-CM | POA: Diagnosis present

## 2015-05-10 DIAGNOSIS — K219 Gastro-esophageal reflux disease without esophagitis: Secondary | ICD-10-CM | POA: Diagnosis present

## 2015-05-10 DIAGNOSIS — R569 Unspecified convulsions: Secondary | ICD-10-CM | POA: Diagnosis not present

## 2015-05-10 DIAGNOSIS — J441 Chronic obstructive pulmonary disease with (acute) exacerbation: Secondary | ICD-10-CM | POA: Diagnosis present

## 2015-05-10 DIAGNOSIS — Z833 Family history of diabetes mellitus: Secondary | ICD-10-CM | POA: Diagnosis not present

## 2015-05-10 DIAGNOSIS — Z9981 Dependence on supplemental oxygen: Secondary | ICD-10-CM | POA: Diagnosis not present

## 2015-05-10 DIAGNOSIS — F482 Pseudobulbar affect: Secondary | ICD-10-CM | POA: Diagnosis present

## 2015-05-10 DIAGNOSIS — I2510 Atherosclerotic heart disease of native coronary artery without angina pectoris: Secondary | ICD-10-CM | POA: Diagnosis present

## 2015-05-10 DIAGNOSIS — D649 Anemia, unspecified: Secondary | ICD-10-CM | POA: Diagnosis present

## 2015-05-10 DIAGNOSIS — M1990 Unspecified osteoarthritis, unspecified site: Secondary | ICD-10-CM | POA: Diagnosis present

## 2015-05-10 DIAGNOSIS — D72829 Elevated white blood cell count, unspecified: Secondary | ICD-10-CM | POA: Diagnosis present

## 2015-05-10 DIAGNOSIS — G4733 Obstructive sleep apnea (adult) (pediatric): Secondary | ICD-10-CM | POA: Diagnosis present

## 2015-05-10 DIAGNOSIS — R531 Weakness: Secondary | ICD-10-CM | POA: Diagnosis present

## 2015-05-10 DIAGNOSIS — E039 Hypothyroidism, unspecified: Secondary | ICD-10-CM

## 2015-05-10 DIAGNOSIS — E875 Hyperkalemia: Secondary | ICD-10-CM | POA: Diagnosis present

## 2015-05-10 DIAGNOSIS — E1059 Type 1 diabetes mellitus with other circulatory complications: Secondary | ICD-10-CM | POA: Diagnosis not present

## 2015-05-10 DIAGNOSIS — I10 Essential (primary) hypertension: Secondary | ICD-10-CM | POA: Diagnosis present

## 2015-05-10 DIAGNOSIS — N179 Acute kidney failure, unspecified: Secondary | ICD-10-CM | POA: Diagnosis present

## 2015-05-10 DIAGNOSIS — G4089 Other seizures: Secondary | ICD-10-CM | POA: Diagnosis present

## 2015-05-10 DIAGNOSIS — I6320 Cerebral infarction due to unspecified occlusion or stenosis of unspecified precerebral arteries: Secondary | ICD-10-CM | POA: Diagnosis not present

## 2015-05-10 DIAGNOSIS — E538 Deficiency of other specified B group vitamins: Secondary | ICD-10-CM | POA: Diagnosis present

## 2015-05-10 DIAGNOSIS — I639 Cerebral infarction, unspecified: Secondary | ICD-10-CM | POA: Diagnosis present

## 2015-05-10 DIAGNOSIS — R079 Chest pain, unspecified: Secondary | ICD-10-CM | POA: Diagnosis not present

## 2015-05-10 DIAGNOSIS — Z8249 Family history of ischemic heart disease and other diseases of the circulatory system: Secondary | ICD-10-CM | POA: Diagnosis not present

## 2015-05-10 DIAGNOSIS — I252 Old myocardial infarction: Secondary | ICD-10-CM | POA: Diagnosis not present

## 2015-05-10 DIAGNOSIS — I6350 Cerebral infarction due to unspecified occlusion or stenosis of unspecified cerebral artery: Secondary | ICD-10-CM

## 2015-05-10 DIAGNOSIS — Z794 Long term (current) use of insulin: Secondary | ICD-10-CM | POA: Diagnosis not present

## 2015-05-10 LAB — URINE MICROSCOPIC-ADD ON
BACTERIA UA: NONE SEEN
RBC / HPF: NONE SEEN RBC/hpf (ref 0–5)
Squamous Epithelial / LPF: NONE SEEN

## 2015-05-10 LAB — GLUCOSE, CAPILLARY
GLUCOSE-CAPILLARY: 155 mg/dL — AB (ref 65–99)
GLUCOSE-CAPILLARY: 253 mg/dL — AB (ref 65–99)
Glucose-Capillary: 83 mg/dL (ref 65–99)
Glucose-Capillary: 103 mg/dL — ABNORMAL HIGH (ref 65–99)
Glucose-Capillary: 181 mg/dL — ABNORMAL HIGH (ref 65–99)

## 2015-05-10 LAB — URINALYSIS, ROUTINE W REFLEX MICROSCOPIC
BILIRUBIN URINE: NEGATIVE
GLUCOSE, UA: NEGATIVE mg/dL
HGB URINE DIPSTICK: NEGATIVE
Ketones, ur: NEGATIVE mg/dL
Nitrite: NEGATIVE
Protein, ur: NEGATIVE mg/dL
SPECIFIC GRAVITY, URINE: 1.025 (ref 1.005–1.030)
pH: 5.5 (ref 5.0–8.0)

## 2015-05-10 LAB — DIFFERENTIAL
Basophils Absolute: 0.1 10*3/uL (ref 0.0–0.1)
Basophils Relative: 1 %
EOS ABS: 0.3 10*3/uL (ref 0.0–0.7)
EOS PCT: 2 %
Lymphocytes Relative: 21 %
Lymphs Abs: 3.3 10*3/uL (ref 0.7–4.0)
MONO ABS: 0.8 10*3/uL (ref 0.1–1.0)
Monocytes Relative: 5 %
NEUTROS PCT: 71 %
Neutro Abs: 11 10*3/uL — ABNORMAL HIGH (ref 1.7–7.7)

## 2015-05-10 LAB — RAPID URINE DRUG SCREEN, HOSP PERFORMED
AMPHETAMINES: NOT DETECTED
BENZODIAZEPINES: NOT DETECTED
Barbiturates: NOT DETECTED
COCAINE: NOT DETECTED
Opiates: NOT DETECTED
Tetrahydrocannabinol: NOT DETECTED

## 2015-05-10 LAB — CBC
HCT: 33 % — ABNORMAL LOW (ref 39.0–52.0)
Hemoglobin: 10.4 g/dL — ABNORMAL LOW (ref 13.0–17.0)
MCH: 27.1 pg (ref 26.0–34.0)
MCHC: 31.5 g/dL (ref 30.0–36.0)
MCV: 85.9 fL (ref 78.0–100.0)
PLATELETS: 297 10*3/uL (ref 150–400)
RBC: 3.84 MIL/uL — ABNORMAL LOW (ref 4.22–5.81)
RDW: 14.8 % (ref 11.5–15.5)
WBC: 15.5 10*3/uL — ABNORMAL HIGH (ref 4.0–10.5)

## 2015-05-10 LAB — TROPONIN I: Troponin I: <0.03 ng/mL (ref <0.031)

## 2015-05-10 LAB — PROTIME-INR
INR: 1.11 (ref 0.00–1.49)
PROTHROMBIN TIME: 14.5 s (ref 11.6–15.2)

## 2015-05-10 LAB — COMPREHENSIVE METABOLIC PANEL
ALBUMIN: 3.7 g/dL (ref 3.5–5.0)
ALK PHOS: 72 U/L (ref 38–126)
ALT: 13 U/L — ABNORMAL LOW (ref 17–63)
ANION GAP: 6 (ref 5–15)
AST: 14 U/L — ABNORMAL LOW (ref 15–41)
BUN: 36 mg/dL — ABNORMAL HIGH (ref 6–20)
CALCIUM: 9.2 mg/dL (ref 8.9–10.3)
CO2: 27 mmol/L (ref 22–32)
Chloride: 103 mmol/L (ref 101–111)
Creatinine, Ser: 1.6 mg/dL — ABNORMAL HIGH (ref 0.61–1.24)
GFR calc non Af Amer: 41 mL/min — ABNORMAL LOW (ref >60)
GFR, EST AFRICAN AMERICAN: 48 mL/min — AB (ref >60)
GLUCOSE: 113 mg/dL — AB (ref 65–99)
POTASSIUM: 5.3 mmol/L — AB (ref 3.5–5.1)
SODIUM: 136 mmol/L (ref 135–145)
TOTAL PROTEIN: 7.6 g/dL (ref 6.5–8.1)
Total Bilirubin: 0.5 mg/dL (ref 0.3–1.2)

## 2015-05-10 LAB — ETHANOL

## 2015-05-10 LAB — APTT: aPTT: 33 seconds (ref 24–37)

## 2015-05-10 MED ORDER — ENOXAPARIN SODIUM 40 MG/0.4ML ~~LOC~~ SOLN
40.0000 mg | SUBCUTANEOUS | Status: DC
  Administered 2015-05-10 – 2015-05-12 (×3): 40 mg via SUBCUTANEOUS
  Filled 2015-05-10 (×3): qty 0.4

## 2015-05-10 MED ORDER — PANTOPRAZOLE SODIUM 40 MG IV SOLR
40.0000 mg | INTRAVENOUS | Status: DC
  Administered 2015-05-10 – 2015-05-11 (×2): 40 mg via INTRAVENOUS
  Filled 2015-05-10 (×2): qty 40

## 2015-05-10 MED ORDER — SENNOSIDES-DOCUSATE SODIUM 8.6-50 MG PO TABS: 1.0000 | ORAL_TABLET | Freq: Every evening | ORAL | Status: DC | PRN

## 2015-05-10 MED ORDER — IPRATROPIUM-ALBUTEROL 0.5-2.5 (3) MG/3ML IN SOLN: 3.0000 mL | RESPIRATORY_TRACT | Status: DC

## 2015-05-10 MED ORDER — INSULIN ASPART 100 UNIT/ML ~~LOC~~ SOLN
0.0000 [IU] | SUBCUTANEOUS | Status: DC
  Administered 2015-05-10 (×2): 4 [IU] via SUBCUTANEOUS
  Administered 2015-05-10: 11 [IU] via SUBCUTANEOUS
  Administered 2015-05-11: 4 [IU] via SUBCUTANEOUS
  Administered 2015-05-11 (×2): 3 [IU] via SUBCUTANEOUS
  Administered 2015-05-11: 4 [IU] via SUBCUTANEOUS

## 2015-05-10 MED ORDER — ASPIRIN 325 MG PO TABS
325.0000 mg | ORAL_TABLET | Freq: Every day | ORAL | Status: DC
  Administered 2015-05-11 – 2015-05-12 (×2): 325 mg via ORAL
  Filled 2015-05-10 (×2): qty 1

## 2015-05-10 MED ORDER — METHYLPREDNISOLONE SODIUM SUCC 125 MG IJ SOLR
125.0000 mg | Freq: Once | INTRAMUSCULAR | Status: AC
  Administered 2015-05-10: 125 mg via INTRAVENOUS
  Filled 2015-05-10: qty 2

## 2015-05-10 MED ORDER — IPRATROPIUM-ALBUTEROL 0.5-2.5 (3) MG/3ML IN SOLN: 3.0000 mL | RESPIRATORY_TRACT | Status: DC | PRN

## 2015-05-10 MED ORDER — SODIUM CHLORIDE 0.9 % IV SOLN
INTRAVENOUS | Status: DC
  Administered 2015-05-10 – 2015-05-11 (×2): via INTRAVENOUS

## 2015-05-10 MED ORDER — STROKE: EARLY STAGES OF RECOVERY BOOK
Freq: Once | Status: AC
  Administered 2015-05-10: 11:00:00
  Filled 2015-05-10: qty 1

## 2015-05-10 MED ORDER — ASPIRIN 300 MG RE SUPP
300.0000 mg | Freq: Once | RECTAL | Status: AC
  Administered 2015-05-10: 300 mg via RECTAL

## 2015-05-10 MED ORDER — ASPIRIN 300 MG RE SUPP
300.0000 mg | Freq: Every day | RECTAL | Status: DC
  Administered 2015-05-10: 300 mg via RECTAL
  Filled 2015-05-10: qty 1

## 2015-05-10 MED ORDER — PERFLUTREN LIPID MICROSPHERE
1.0000 mL | INTRAVENOUS | Status: AC | PRN
  Administered 2015-05-10: 3 mL via INTRAVENOUS
  Filled 2015-05-10: qty 10

## 2015-05-10 NOTE — Procedures (Signed)
Objective Swallowing Evaluation: MBS-Modified Barium Swallow Study  Patient Details  Name: [name] MRN: [id number] Date of Birth: [birth year]  Today's Date: 05/10/2015 Time: SLP Start Time (ACUTE ONLY): 1613-SLP Stop Time (ACUTE ONLY): 1643 SLP Time Calculation (min) (ACUTE ONLY): 30 min  Past Medical History:  Past Medical History  Diagnosis Date  . GERD (gastroesophageal reflux disease)   . CAD (coronary artery disease)   . Obesity   . Hyperlipidemia   . Hypertension   . Chronic back pain   . Gout   . Chronic bronchitis ([place])     "get it q yr"  . Psoriasis   . COPD (chronic obstructive pulmonary disease) ([place])   . Myocardial infarction ([place]) 1999  . Pneumonia     "a few times; last time was ~ 06/2013"  . On home oxygen therapy     "2L; only at night" (08/30/2013)  . Diabetes mellitus, type 1   . Arthritis of knee   . Arthritis     "left leg" (08/30/2013)  . Vitamin B12 deficiency 05/01/2015  . Abnormality of gait 05/01/2015  . Tussive syncope 05/01/2015   Past Surgical History:  Past Surgical History  Procedure Laterality Date  . Appendectomy    . Colonoscopy  03/17/2012    Procedure: COLONOSCOPY;  Surgeon: Rogene Houston, MD;  Location: [facility] SUITE;  Service: Endoscopy;  Laterality: N/A;  830  . Cystoscopy with urethral dilatation N/A 11/26/2012    Procedure: CYSTOSCOPY WITH URETHRAL DILATATION;  Surgeon: Malka So, MD;  Location: [facility] ORS;  Service: Urology;  Laterality: N/A;  . Coronary angioplasty with stent placement  1999- 2009-08/30/2013    "counting today's, I have 5 stents" (08/30/2013)  . Left heart catheterization with coronary angiogram N/A 08/30/2013    Procedure: LEFT HEART CATHETERIZATION WITH CORONARY ANGIOGRAM;  Surgeon: Clent Demark, MD;  Location[facility] CATH LAB;  Service: Cardiovascular;  Laterality: N/A;  . Percutaneous coronary stent intervention (pci-s) Right 08/30/2013    Procedure: PERCUTANEOUS CORONARY STENT INTERVENTION (PCI-S);   Surgeon: Clent Demark, MD;  Location: [facility] CATH LAB;  Service: Cardiovascular;  Laterality: Right;   HPI: Mr. [name] is a 74 yo male who was admitted yesterday with symptoms consistent with stroke. MRI showed acute lacunar infarct in the left paracentral pons, basilar artery perforator territory. No associated hemorrage or mass effect; Chronic small vessle ischemia in the deep gray matter nuclei, primarily the thalamostriate artery territory; Intracranial artery dolichoectasia. Moderate stenosis of the left ICA siphon. No major circle of Willis branch occlusion. Mr. [name] is known to this SLP from previous hospitalization a few weeks ago for COPD exacerbation and c/o of frequently choking during meals. A barium swallow was completed during that hospitalization which showed: Laryngeal penetration and aspiration of contrast with an additional episode of prolonged strong coughing when swallowing water with the barium tablet likely representing additional nonvisualized aspiration. MBSS recommended due to ongoing  c/o dysphagia now in setting of CVA.  Subjective: Pt alert and cooperative, up in chair for MBSS  Assessment / Plan / Recommendation  CHL IP CLINICAL IMPRESSIONS 05/10/2015  Therapy Diagnosis WFL;Mild pharyngeal phase dysphagia  Clinical Impression Mr. [name] demonstrated mild pharyngeal phase dysphagia characterized by premature spillage with delay in swallow initiation with thin liquids triggering after filling the pyriforms with cup and straw sips; mild post swallow residuals noted in the pyriforms and lateral channels which cleared with cued repeat/dry swallow. No penetration or aspiration observed. Swallow function WNL for solid  textures, however pt with subjective report of difficulty with dry, crunchy foods which elicit cough response. Pt with history of COPD and was recently hospitalized for the same and barium swallow completed during that hospitalization demonstrated aspiration  of barium contrast (breathing was compromised during that stay). Recommend D3/mech soft with thin liquids with use of aspiration and reflux precautions. Pt to take small bites/sips, swallow 2x for each bite/sip, and avoid talking during meals.   Impact on safety and function Mild aspiration risk      CHL IP TREATMENT RECOMMENDATION 05/10/2015  Treatment Recommendations No treatment recommended at this time     Prognosis 05/10/2015  Prognosis for Safe Diet Advancement Good  Barriers to Reach Goals --  Barriers/Prognosis Comment h/o COPD    CHL IP DIET RECOMMENDATION 05/10/2015  SLP Diet Recommendations Dysphagia 3 (Mech soft) solids;Thin liquid  Liquid Administration via Cup  Medication Administration Whole meds with liquid  Compensations Slow rate;Small sips/bites;Multiple dry swallows after each bite/sip  Postural Changes Remain semi-upright after after feeds/meals (Comment);Seated upright at 90 degrees      CHL IP OTHER RECOMMENDATIONS 05/10/2015  Recommended Consults --  Oral Care Recommendations Oral care BID  Other Recommendations Clarify dietary restrictions      CHL IP FOLLOW UP RECOMMENDATIONS 05/10/2015  Follow up Recommendations None      No flowsheet data found.         CHL IP ORAL PHASE 05/10/2015  Oral Phase WFL  Oral - Pudding Teaspoon --  Oral - Pudding Cup --  Oral - Honey Teaspoon --  Oral - Honey Cup --  Oral - Nectar Teaspoon --  Oral - Nectar Cup --  Oral - Nectar Straw --  Oral - Thin Teaspoon --  Oral - Thin Cup --  Oral - Thin Straw --  Oral - Puree --  Oral - Mech Soft --  Oral - Regular --  Oral - Multi-Consistency --  Oral - Pill --  Oral Phase - Comment --    CHL IP PHARYNGEAL PHASE 05/10/2015  Pharyngeal Phase Impaired  Pharyngeal- Pudding Teaspoon --  Pharyngeal --  Pharyngeal- Pudding Cup --  Pharyngeal --  Pharyngeal- Honey Teaspoon --  Pharyngeal --  Pharyngeal- Honey Cup --  Pharyngeal --  Pharyngeal- Nectar Teaspoon --   Pharyngeal --  Pharyngeal- Nectar Cup --  Pharyngeal --  Pharyngeal- Nectar Straw --  Pharyngeal --  Pharyngeal- Thin Teaspoon --  Pharyngeal --  Pharyngeal- Thin Cup Delayed swallow initiation-vallecula;Delayed swallow initiation-pyriform sinuses;Pharyngeal residue - pyriform  Pharyngeal --  Pharyngeal- Thin Straw Delayed swallow initiation-pyriform sinuses;Pharyngeal residue - pyriform;Lateral channel residue  Pharyngeal --  Pharyngeal- Puree WFL;Delayed swallow initiation-vallecula  Pharyngeal --  Pharyngeal- Mechanical Soft --  Pharyngeal --  Pharyngeal- Regular Delayed swallow initiation-vallecula;WFL  Pharyngeal --  Pharyngeal- Multi-consistency --  Pharyngeal --  Pharyngeal- Pill WFL  Pharyngeal --  Pharyngeal Comment --     CHL IP CERVICAL ESOPHAGEAL PHASE 05/10/2015  Cervical Esophageal Phase WFL  Pudding Teaspoon --  Pudding Cup --  Honey Teaspoon --  Honey Cup --  Nectar Teaspoon --  Nectar Cup --  Nectar Straw --  Thin Teaspoon --  Thin Cup --  Thin Straw --  Puree --  Mechanical Soft --  Regular --  Multi-consistency --  Pill --  Cervical Esophageal Comment --   Thank you,  Genene Churn, [place]   PORTER,DABNEY 05/10/2015, 5:34 PM

## 2015-05-10 NOTE — Consult Note (Signed)
   [facility] Inpatient Consult   05/10/2015  [name] May 21, 1940 [id number]   Patient is currently active with [facility] for chronic disease management services.  Patient has been engaged by a [employer] and Pharmacy.  Our community based plan of care has focused on disease management and community resource support.  Patient will receive a post discharge transition of care call and will be evaluated for monthly home visits for assessments and disease process education.  Made Inpatient Case Manager aware that [place] [facility] following.  Of note, [facility] Care Management services does not replace or interfere with any services that are arranged by inpatient case management or social work.    For additional questions or referrals please contact:  Royetta Crochet. Laymond Purser, RN, BSN, [facility] Liaison [phone]

## 2015-05-10 NOTE — Progress Notes (Signed)
SLP Cancellation Note  Patient Details Name: [name] MRN: [id number] DOB: 1940-05-20   Cancelled treatment:       Reason Eval/Treat Not Completed: SLP screened, no needs identified, will sign off; Pt seen for MBSS today and recommendation made for D3/thin. Pt denies any changes with speech, language, or cognitive linguistic skills with this hospitalization. He does endorse that his memory "isn't what it used to be".  No further acute SLP needs identified at this time.  Thank you,  Genene Churn, [place]    [place] 05/10/2015, 5:37 PM

## 2015-05-10 NOTE — Consult Note (Addendum)
John A. Merlene Laughter, MD     [email or web address]          [name] is an 74 y.o. male.   ASSESSMENT/PLAN: Acute left pontine infarct. This is likely due to thrombosis of a perforating vessel off the basilar artery. Risk factors hypertension, age, dyslipidemia, and coronary artery disease. Agree with increased dose of aspirin to 325. Unexplained dysphagia over the last several months with difficulty swallowing liquids. Cough and swallowing due to induced syncope. Gait impairment. Vitamin B12 deficiency. Pseudobulbar affect. Likely obstructive sleep apnea syndrome  RECOMMENDATION: Agree with increased dose of aspirin. Swallowing evaluation/ evaluation. Physical and occupational therapy. Prozac will be added to help with pseudobulbar affect and the rehabilitation post stroke. This is preferred over nuedexta for now as the Prozac can help improve post stroke rehabilitation. Replace vitamin B12. EEG. Acetylcholine receptor antibodies for workup of chronic dysphagia. Consider outpatient sleep study.  The patient presents with a two-day history of the right-sided weakness. This occurred at about 3 PM on Tuesday. Patient has been seen by Dr. Moshe Cipro for episodic confusion and falling. He actually had a MRI scheduled for later this month.The patient has had recurrent episodes of coughing and difficulty swallowing liquids for the last year. Symptoms are gotten worse significantly over the last 3 weeks. It appears that the difficult swallowing liquids causes the patient to cough and then a cough induced a brief episode of syncope. He is having recurrent spells usually one or more a day. He does not report having dysphagia with at this solid foods. He denies any dysarthria although he did have dysarthria stay when he developed right-sided numbness and weakness. He does have some shortness of breath which she thinks is coming from baseline COPD. The patient has had  multiple episodes of crying spells occurring spontaneously without being sad or depressed. The review of systems otherwise negative.  GENERAL: Pleasant overweight man in no acute distress.  HEENT: Supple. Atraumatic normocephalic. Short stocky neck.  ABDOMEN: soft  EXTREMITIES: No edema   BACK: Normal.  SKIN: Normal by inspection.    MENTAL STATUS: Alert and oriented. The patient states his age and month appropriately. Speech- reveals slow dysarthria, language and cognition are generally intact. Judgment and insight normal.   CRANIAL NERVES: Pupils are equal, round and reactive to light and accommodation; extra ocular movements are full, there is no significant nystagmus; visual fields are full; upper and lower facial muscles are normal in strength and symmetric, there is no flattening of the nasolabial folds; tongue is midline; uvula is midline; shoulder elevation is normal.  MOTOR: Right upper extremity: Tricep and deltoid 4+. Hand grip 5. There is a mild right upper extremity drift. Right lower extremity hip flexion 4 minus/5. There is a drift right leg. Left side shows normal tone, bulk and strength.  COORDINATION: Left finger to nose is normal, right finger to nose is normal, No rest tremor; no intention tremor; no postural tremor; no bradykinesia.  REFLEXES: Deep tendon reflexes are symmetrical and normal. Babinski reflexes are flexor bilaterally.   SENSATION: Reduced sensation in right upper and lower extremity. No extinction to tactile or visual double simultaneous civilization.    NIH stroke scale 5.      Blood pressure 103/48, pulse 83, temperature 97.6 F (36.4 C), temperature source Oral, resp. rate 20, height 5' 10.5" (1.791 m), weight 107.049 kg (236 lb), SpO2 94 %.  Past Medical History  Diagnosis Date  . GERD (gastroesophageal reflux disease)   .  CAD (coronary artery disease)   . Obesity   . Hyperlipidemia   . Hypertension   . Chronic back pain   . Gout     . Chronic bronchitis ([place])     "get it q yr"  . Psoriasis   . COPD (chronic obstructive pulmonary disease) ([place])   . Myocardial infarction ([place]) 1999  . Pneumonia     "a few times; last time was ~ 06/2013"  . On home oxygen therapy     "2L; only at night" (08/30/2013)  . Diabetes mellitus, type 1   . Arthritis of knee   . Arthritis     "left leg" (08/30/2013)  . Vitamin B12 deficiency 05/01/2015  . Abnormality of gait 05/01/2015  . Tussive syncope 05/01/2015    Past Surgical History  Procedure Laterality Date  . Appendectomy    . Colonoscopy  03/17/2012    Procedure: COLONOSCOPY;  Surgeon: Rogene Houston, MD;  Location: [facility];  Service: Endoscopy;  Laterality: N/A;  830  . Cystoscopy with urethral dilatation N/A 11/26/2012    Procedure: CYSTOSCOPY WITH URETHRAL DILATATION;  Surgeon: Malka So, MD;  Location: [facility] ORS;  Service: Urology;  Laterality: N/A;  . Coronary angioplasty with stent placement  1999- 2009-08/30/2013    "counting today's, I have 5 stents" (08/30/2013)  . Left heart catheterization with coronary angiogram N/A 08/30/2013    Procedure: LEFT HEART CATHETERIZATION WITH CORONARY ANGIOGRAM;  Surgeon: Clent Demark, MD;  Location: [facility] CATH LAB;  Service: Cardiovascular;  Laterality: N/A;  . Percutaneous coronary stent intervention (pci-s) Right 08/30/2013    Procedure: PERCUTANEOUS CORONARY STENT INTERVENTION (PCI-S);  Surgeon: Clent Demark, MD;  Location: [facility] CATH LAB;  Service: Cardiovascular;  Laterality: Right;    Family History  Problem Relation Age of Onset  . Hypertension Mother   . Diabetes Mother   . Hypertension Father   . Diabetes Brother   . Arthritis      Family History   . Diabetes      family History   . Stroke Daughter     Social History:  reports that he has quit smoking. His smoking use included Cigarettes. He has a 16.5 pack-year smoking history. He has never used smokeless tobacco. He reports that he does not drink alcohol or use  illicit drugs.  Allergies: No Known Allergies  Medications: Prior to Admission medications   Medication Sig Start Date End Date Taking? Authorizing Provider  acetaminophen (TYLENOL) 500 MG tablet Take 500 mg by mouth every 6 (six) hours as needed for moderate pain (left thigh).   Yes Historical Provider, MD  albuterol (PROVENTIL HFA;VENTOLIN HFA) 108 (90 BASE) MCG/ACT inhaler Inhale 1-2 puffs into the lungs every 6 (six) hours as needed for wheezing or shortness of breath. 11/13/14  Yes Nat Christen, MD  allopurinol (ZYLOPRIM) 300 MG tablet Take 300 mg by mouth daily. 05/02/13  Yes Historical Provider, MD  amLODipine (NORVASC) 10 MG tablet Take 1 tablet (10 mg total) by mouth daily. 09/20/14  Yes Fayrene Helper, MD  aspirin 81 MG tablet Take 81 mg by mouth daily.   Yes Historical Provider, MD  insulin NPH-regular Human (NOVOLIN 70/30) (70-30) 100 UNIT/ML injection Inject 60 Units into the skin 2 (two) times daily with a meal. 04/20/15  Yes Estela Leonie Green, MD  levothyroxine (SYNTHROID, LEVOTHROID) 75 MCG tablet Take 75 mcg by mouth every morning.    Yes Historical Provider, MD  lisinopril (PRINIVIL,ZESTRIL) 40 MG tablet Take  1 tablet (40 mg total) by mouth daily. 09/20/14  Yes Fayrene Helper, MD  metFORMIN (GLUCOPHAGE) 500 MG tablet Take 1 tablet (500 mg total) by mouth 2 (two) times daily. 09/02/13  Yes Charolette Forward, MD  metoprolol (LOPRESSOR) 50 MG tablet Take 1 tablet (50 mg total) by mouth 2 (two) times daily. 12/25/14  Yes Fayrene Helper, MD  pravastatin (PRAVACHOL) 40 MG tablet Take 1 tablet (40 mg total) by mouth daily. 09/20/14  Yes Fayrene Helper, MD  spironolactone (ALDACTONE) 25 MG tablet Take 1 tablet (25 mg total) by mouth daily. 12/25/14  Yes Fayrene Helper, MD  vitamin B-12 (CYANOCOBALAMIN) 1000 MCG tablet Take 1,000 mcg by mouth daily.   Yes Historical Provider, MD    Scheduled Meds: . aspirin  300 mg Rectal Daily   Or  . aspirin  325 mg Oral Daily  .  enoxaparin (LOVENOX) injection  40 mg Subcutaneous Q24H  . insulin aspart  0-20 Units Subcutaneous 6 times per day  . pantoprazole (PROTONIX) IV  40 mg Intravenous Q24H   Continuous Infusions: . sodium chloride 75 mL/hr at 05/10/15 1821   PRN Meds:.ipratropium-albuterol, senna-docusate     Results for orders placed or performed during the hospital encounter of 05/09/15 (from the past 48 hour(s))  Protime-INR     Status: None   Collection Time: 05/09/15 11:55 PM  Result Value Ref Range   Prothrombin Time 14.5 11.6 - 15.2 seconds   INR 1.11 0.00 - 1.49  APTT     Status: None   Collection Time: 05/09/15 11:55 PM  Result Value Ref Range   aPTT 33 24 - 37 seconds  CBC     Status: Abnormal   Collection Time: 05/09/15 11:55 PM  Result Value Ref Range   WBC 15.5 (H) 4.0 - 10.5 K/uL   RBC 3.84 (L) 4.22 - 5.81 MIL/uL   Hemoglobin 10.4 (L) 13.0 - 17.0 g/dL   HCT 33.0 (L) 39.0 - 52.0 %   MCV 85.9 78.0 - 100.0 fL   MCH 27.1 26.0 - 34.0 pg   MCHC 31.5 30.0 - 36.0 g/dL   RDW 14.8 11.5 - 15.5 %   Platelets 297 150 - 400 K/uL  Differential     Status: Abnormal   Collection Time: 05/09/15 11:55 PM  Result Value Ref Range   Neutrophils Relative % 71 %   Neutro Abs 11.0 (H) 1.7 - 7.7 K/uL   Lymphocytes Relative 21 %   Lymphs Abs 3.3 0.7 - 4.0 K/uL   Monocytes Relative 5 %   Monocytes Absolute 0.8 0.1 - 1.0 K/uL   Eosinophils Relative 2 %   Eosinophils Absolute 0.3 0.0 - 0.7 K/uL   Basophils Relative 1 %   Basophils Absolute 0.1 0.0 - 0.1 K/uL  Comprehensive metabolic panel     Status: Abnormal   Collection Time: 05/09/15 11:55 PM  Result Value Ref Range   Sodium 136 135 - 145 mmol/L   Potassium 5.3 (H) 3.5 - 5.1 mmol/L   Chloride 103 101 - 111 mmol/L   CO2 27 22 - 32 mmol/L   Glucose, Bld 113 (H) 65 - 99 mg/dL   BUN 36 (H) 6 - 20 mg/dL   Creatinine, Ser 1.60 (H) 0.61 - 1.24 mg/dL   Calcium 9.2 8.9 - 10.3 mg/dL   Total Protein 7.6 6.5 - 8.1 g/dL   Albumin 3.7 3.5 - 5.0 g/dL    AST 14 (L) 15 - 41 U/L   ALT  13 (L) 17 - 63 U/L   Alkaline Phosphatase 72 38 - 126 U/L   Total Bilirubin 0.5 0.3 - 1.2 mg/dL   GFR calc non Af Amer 41 (L) >60 mL/min   GFR calc Af Amer 48 (L) >60 mL/min    Comment: (NOTE) The eGFR has been calculated using the CKD EPI equation. This calculation has not been validated in all clinical situations. eGFR's persistently <60 mL/min signify possible Chronic Kidney Disease.    Anion gap 6 5 - 15  Troponin I     Status: None   Collection Time: 05/09/15 11:55 PM  Result Value Ref Range   Troponin I <0.03 <0.031 ng/mL    Comment:        NO INDICATION OF MYOCARDIAL INJURY.   Glucose, capillary     Status: None   Collection Time: 05/10/15  5:15 AM  Result Value Ref Range   Glucose-Capillary 83 65 - 99 mg/dL   Comment 1 Notify RN    Comment 2 Document in Chart   Glucose, capillary     Status: Abnormal   Collection Time: 05/10/15  7:22 AM  Result Value Ref Range   Glucose-Capillary 103 (H) 65 - 99 mg/dL   Comment 1 Notify RN    Comment 2 Document in Chart   Urine rapid drug screen (hosp performed)not at [facility]     Status: None   Collection Time: 05/10/15  7:30 AM  Result Value Ref Range   Opiates NONE DETECTED NONE DETECTED   Cocaine NONE DETECTED NONE DETECTED   Benzodiazepines NONE DETECTED NONE DETECTED   Amphetamines NONE DETECTED NONE DETECTED   Tetrahydrocannabinol NONE DETECTED NONE DETECTED   Barbiturates NONE DETECTED NONE DETECTED    Comment:        DRUG SCREEN FOR MEDICAL PURPOSES ONLY.  IF CONFIRMATION IS NEEDED FOR ANY PURPOSE, NOTIFY LAB WITHIN 5 DAYS.        LOWEST DETECTABLE LIMITS FOR URINE DRUG SCREEN Drug Class       Cutoff (ng/mL) Amphetamine      1000 Barbiturate      200 Benzodiazepine   245 Tricyclics       809 Opiates          300 Cocaine          300 THC              50   Urinalysis, Routine w reflex microscopic (not at [facility])     Status: Abnormal   Collection Time: 05/10/15  7:30 AM  Result Value  Ref Range   Color, Urine YELLOW YELLOW   APPearance CLEAR CLEAR   Specific Gravity, Urine 1.025 1.005 - 1.030   pH 5.5 5.0 - 8.0   Glucose, UA NEGATIVE NEGATIVE mg/dL   Hgb urine dipstick NEGATIVE NEGATIVE   Bilirubin Urine NEGATIVE NEGATIVE   Ketones, ur NEGATIVE NEGATIVE mg/dL   Protein, ur NEGATIVE NEGATIVE mg/dL   Nitrite NEGATIVE NEGATIVE   Leukocytes, UA TRACE (A) NEGATIVE  Urine microscopic-add on     Status: None   Collection Time: 05/10/15  7:30 AM  Result Value Ref Range   Squamous Epithelial / LPF NONE SEEN NONE SEEN   WBC, UA 0-5 0 - 5 WBC/hpf   RBC / HPF NONE SEEN 0 - 5 RBC/hpf   Bacteria, UA NONE SEEN NONE SEEN   Urine-Other YEAST     Comment: MANY  Glucose, capillary     Status: Abnormal   Collection Time: 05/10/15 11:36  AM  Result Value Ref Range   Glucose-Capillary 181 (H) 65 - 99 mg/dL   Comment 1 Notify RN    Comment 2 Document in Chart   Glucose, capillary     Status: Abnormal   Collection Time: 05/10/15  3:54 PM  Result Value Ref Range   Glucose-Capillary 155 (H) 65 - 99 mg/dL   Comment 1 Notify RN    Comment 2 Document in Chart   Ethanol     Status: None   Collection Time: 05/10/15 11:55 PM  Result Value Ref Range   Alcohol, Ethyl (B) <5 <5 mg/dL    Comment:        LOWEST DETECTABLE LIMIT FOR SERUM ALCOHOL IS 5 mg/dL FOR MEDICAL PURPOSES ONLY     Studies/Results:  [[[[[[[[[[[[[[[PCP NOT3 04-26-15 Pt in for hospital follow up due to altered mental status, repeated near syncope with falls, 4 in the past 1 week, and also behavioral changes with crying spells and episodes of confusion. The behavioral changes have been notes for the past 6 to 8 weeks by his spouse. He is noted to reprot increased forgetfulness, with episodes of crying spells and depression . In the past 2 to 3 weeks, his wife reports noting him just having near passing out spells , when he falls to the floor, even when sitting in a chair. He has had 4 falls witnessed in the past 4  weeks Hospital records have been and are reviewed at the time of  the visit Review of Systems See HPI Denies recent fever or chills. Denies sinus pressure, nasal congestion, ear pain or sore throat. Denies chest congestion, productive cough or wheezing. Denies chest pains, palpitations and leg swelling Denies abdominal pain, nausea, vomiting,diarrhea or constipation.    Denies dysuria, frequency, hesitancy or incontinence.  Denies skin break down or rash]]]]]]]]]]]  [[[[[[[[[[[[[[[[[[BRAIN MRI/MRA IMPRESSION: 1. Acute lacunar infarct in the left paracentral pons, basilar artery perforator territory. No associated hemorrhage or mass effect. 2. Chronic small vessel ischemia in the deep gray matter nuclei, primarily the thalamostriate artery territory. 3. Intracranial artery dolichoectasia. No posterior circulation stenosis. Moderate stenosis of the left ICA siphon. No major circle of Willis branch occlusion.]]]]]]]]]]]]]]]]   TTE - Left ventricle: The cavity size was normal. Wall thickness was   increased in a pattern of moderate to severe LVH. Systolic   function was vigorous. The estimated ejection fraction was in the   range of 65% to 70%. Doppler parameters are consistent with   abnormal left ventricular relaxation (grade 1 diastolic   dysfunction).   Brain MRI is reviewed in person. There is a tiny infarcts seen in the left pontine tegmental region. This is acute infarct seen on diffusion imaging. There are a few bilateral lacunar infarcts that are chronic seen in the thalami and basal ganglia bilaterally. There is a moderate global atrophy. There is mild to deep wet matter increased signal consistent with chronic ischemic changes. There is also mild periventricular leukoencephalopathy involving the posterior horn of the lateral ventricles.     [place] A. Merlene [name], M.D.  Diplomate, Tax adviser of Psychiatry and Neurology ( Neurology). 05/10/2015, 7:45 PM

## 2015-05-10 NOTE — Progress Notes (Signed)
Pt was off unit for MRI Unable to complete neuro check at correct time

## 2015-05-10 NOTE — Progress Notes (Signed)
[facility] PROGRESS NOTE   BRETT ILLES [id number] DOB: 01-Oct-1940 DOA: 05/09/2015 PCP: Tula Nakayama, MD  HPI/Subjective: Still has right-sided weakness, denies any new complaints.  Assessment/Plan: Principal Problem:   Stroke [facility]) Active Problems:   Diabetes mellitus, insulin dependent (IDDM), uncontrolled (HCC)   Hyperlipidemia with target LDL less than 70   Essential hypertension   Coronary atherosclerosis   Hypothyroid   COPD exacerbation (HCC)   AKI (acute kidney injury) (HCC)   GERD (gastroesophageal reflux disease)   CVA (cerebral infarction)   Hyperkalemia   This is an no chart note, patient seen earlier today by my colleague Dr. Arnoldo Morale. Patient seen and examined, data base reviewed. Came in with right-sided weakness, acute renal failure with slight hyperkalemia of 5.3 (on ACE inhibitor and spironolactone). Pending MRI of the brain, hydrate with IV fluids.  Code Status: Full Code Family Communication: Plan discussed with the patient. Disposition Plan: Remains inpatient Diet: Diet NPO time specified  Consultants:  None  Procedures:  None  Antibiotics:  None   Objective: Filed Vitals:   05/10/15 0655 05/10/15 0900  BP: 130/55 135/63  Pulse: 83 84  Temp: 98.6 F (37 C) 98.2 F (36.8 C)  Resp: 20 18    Intake/Output Summary (Last 24 hours) at 05/10/15 1114 Last data filed at 05/10/15 0513  Gross per 24 hour  Intake      0 ml  Output    300 ml  Net   -300 ml   Filed Weights   05/09/15 2333 05/10/15 0255  Weight: 107.956 kg (238 lb) 107.049 kg (236 lb)    Exam: General: Alert and awake, oriented x3, not in any acute distress. HEENT: anicteric sclera, pupils reactive to light and accommodation, EOMI CVS: S1-S2 clear, no murmur rubs or gallops Chest: clear to auscultation bilaterally, no wheezing, rales or rhonchi Abdomen: soft nontender, nondistended, normal bowel sounds, no organomegaly Extremities: no cyanosis,  clubbing or edema noted bilaterally Neuro: Cranial nerves II-XII intact, no focal neurological deficits  Data Reviewed: Basic Metabolic Panel:  Recent Labs Lab 05/07/15 1130 05/09/15 2355  NA 138 136  K 4.8 5.3*  CL 104 103  CO2 25 27  GLUCOSE 102* 113*  BUN 16 36*  CREATININE 1.19 1.60*  CALCIUM 9.6 9.2   Liver Function Tests:  Recent Labs Lab 05/07/15 1130 05/09/15 2355  AST 16 14*  ALT 10* 13*  ALKPHOS 67 72  BILITOT 0.6 0.5  PROT 6.9 7.6  ALBUMIN 3.4* 3.7   No results for input(s): LIPASE, AMYLASE in the last 168 hours. No results for input(s): AMMONIA in the last 168 hours. CBC:  Recent Labs Lab 05/09/15 2355  WBC 15.5*  NEUTROABS 11.0*  HGB 10.4*  HCT 33.0*  MCV 85.9  PLT 297   Cardiac Enzymes:  Recent Labs Lab 05/09/15 2355  TROPONINI <0.03   BNP (last 3 results)  Recent Labs  04/18/15 2200  BNP 22.0    ProBNP (last 3 results) No results for input(s): PROBNP in the last 8760 hours.  CBG:  Recent Labs Lab 05/10/15 0515 05/10/15 0722  GLUCAP 83 103*    Micro No results found for this or any previous visit (from the past 240 hour(s)).   Studies: Ct Head Wo Contrast  05/10/2015  CLINICAL DATA:  74 year old male with right-sided weakness since started yesterday. EXAM: CT HEAD WITHOUT CONTRAST TECHNIQUE: Contiguous axial images were obtained from the base of the skull through the vertex without intravenous contrast. COMPARISON:  Head CT  dated 06/23/2014 FINDINGS: There is slight prominence of the ventricles and sulci compatible with age-related volume loss. Mild periventricular and deep white matter hypodensities represent chronic microvascular ischemic changes. Small left basal ganglia old lacunar infarct. There is no intracranial hemorrhage. No mass effect or midline shift identified. The visualized paranasal sinuses and mastoid air cells are well aerated. The calvarium is intact. IMPRESSION: No acute intracranial hemorrhage. Mild  age-related atrophy and chronic microvascular ischemic disease. If symptoms persist and there are no contraindications, MRI may provide better evaluation if clinically indicated Electronically Signed   By: Anner Crete M.D.   On: 05/10/2015 01:12    Scheduled Meds: . aspirin  300 mg Rectal Daily   Or  . aspirin  325 mg Oral Daily  . enoxaparin (LOVENOX) injection  40 mg Subcutaneous Q24H  . insulin aspart  0-20 Units Subcutaneous 6 times per day  . pantoprazole (PROTONIX) IV  40 mg Intravenous Q24H   Continuous Infusions: . sodium chloride         Time spent: 35 minutes    [facility] A  [facility] Pager [phone] If 7PM-7AM, please contact night-coverage at [email or web address], password [facility] 05/10/2015, 11:14 AM  LOS: 0 days

## 2015-05-10 NOTE — Progress Notes (Signed)
SLP Note  Patient Details Name: [name] MRN: [id number] DOB: 1940-10-28   Plan:        Pt known to this SLP from previous admission a few weeks ago. Pt c/o difficulty swallowing at times which was thought to be negatively impacted by COPD. MBSS was recommended during that admission, however pt was discharged prior to completion. Pt now with documented CVA and c/o dysphagia. Will defer clinical swallow evaluation and go straight to MBSS to maximize efficient care for patient. Will complete at 4:15 PM today.  Thank you,  Genene Churn, [place]    [place] 05/10/2015, 2:06 PM

## 2015-05-10 NOTE — H&P (Addendum)
[facility] Admission History and Physical       [name] [id number] DOB: Jan 25, 1941 DOA: 05/09/2015  Referring physician:  PCP: Tula Nakayama, MD  Specialists:   Chief Complaint:   HPI: [name] is a 74 y.o. male with a history of COPD, CAD S/P PCI with Stents, HTN, DM2 who presents to the ED with complaints of recurring Right sided weakness since yesterday (Tuesday 05/08/2015)  at 3 PM.   The symptoms started at 3 PM and resolved by 6PM, and his wife reports that she tried to have him go to the hospital at tahttime but he refused.   The next day he reports that the symptoms of right sided weakness returned around 1 pm and were not improving.   He presented to the ED this evening around 11 PM.    A CT scan of the head was negative for acute findings and he was referred for a CVA versus TIA Workup.       He denies having  Headache or chest pain but reports having chest tightness and wheezing. He denies any fevers or chills. He also reports having bouts of choking spells when he eats and states it has been going on for months.   Review of Systems: Unable to Obtain from the Patient  Constitutional: No Weight Loss, No Weight Gain, Night Sweats, Fevers, Chills, Dizziness, Light Headedness, Fatigue, or Generalized Weakness HEENT: No Headaches, Difficulty Swallowing,Tooth/Dental Problems,Sore Throat,  No Sneezing, Rhinitis, Ear Ache, Nasal Congestion, or Post Nasal Drip,  Cardio-vascular:  No Chest pain, Orthopnea, PND, Edema in Lower Extremities, Anasarca, Dizziness, Palpitations  Resp: No Dyspnea, No DOE, No Productive Cough, No Non-Productive Cough, No Hemoptysis, No Wheezing.    GI: No Heartburn, Indigestion, Abdominal Pain, Nausea, Vomiting, Diarrhea, Constipation, Hematemesis, Hematochezia, Melena, Change in Bowel Habits,  Loss of Appetite  GU: No Dysuria, No Change in Color of Urine, No Urgency or Urinary Frequency, No Flank pain.  Musculoskeletal: No  Joint Pain or Swelling, No Decreased Range of Motion, No Back Pain.  Neurologic: No Syncope, No Seizures, Muscle Weakness, Paresthesia, Vision Disturbance or Loss, No Diplopia, No Vertigo, No Difficulty Walking,  Skin: No Rash or Lesions. Psych: No Change in Mood or Affect, No Depression or Anxiety, No Memory loss, No Confusion, or Hallucinations   Past Medical History  Diagnosis Date  . GERD (gastroesophageal reflux disease)   . CAD (coronary artery disease)   . Obesity   . Hyperlipidemia   . Hypertension   . Chronic back pain   . Gout   . Chronic bronchitis ([place])     "get it q yr"  . Psoriasis   . COPD (chronic obstructive pulmonary disease) ([place])   . Myocardial infarction ([place]) 1999  . Pneumonia     "a few times; last time was ~ 06/2013"  . On home oxygen therapy     "2L; only at night" (08/30/2013)  . Diabetes mellitus, type 1   . Arthritis of knee   . Arthritis     "left leg" (08/30/2013)  . Vitamin B12 deficiency 05/01/2015  . Abnormality of gait 05/01/2015  . Tussive syncope 05/01/2015     Past Surgical History  Procedure Laterality Date  . Appendectomy    . Colonoscopy  03/17/2012    Procedure: COLONOSCOPY;  Surgeon: Rogene Houston, MD;  Location: [facility] ENDO SUITE;  Service: Endoscopy;  Laterality: N/A;  830  . Cystoscopy with urethral dilatation N/A 11/26/2012    Procedure: CYSTOSCOPY WITH  URETHRAL DILATATION;  Surgeon: Malka So, MD;  Location: [facility] ORS;  Service: Urology;  Laterality: N/A;  . Coronary angioplasty with stent placement  1999- 2009-08/30/2013    "counting today's, I have 5 stents" (08/30/2013)  . Left heart catheterization with coronary angiogram N/A 08/30/2013    Procedure: LEFT HEART CATHETERIZATION WITH CORONARY ANGIOGRAM;  Surgeon: Clent Demark, MD;  Location: [facility] CATH LAB;  Service: Cardiovascular;  Laterality: N/A;  . Percutaneous coronary stent intervention (pci-s) Right 08/30/2013    Procedure: PERCUTANEOUS CORONARY STENT INTERVENTION (PCI-S);   Surgeon: Clent Demark, MD;  Location: [facility] CATH LAB;  Service: Cardiovascular;  Laterality: Right;      Prior to Admission medications   Medication Sig Start Date End Date Taking? Authorizing Provider  acetaminophen (TYLENOL) 500 MG tablet Take 500 mg by mouth every 6 (six) hours as needed for moderate pain (left thigh).    Historical Provider, MD  albuterol (PROVENTIL HFA;VENTOLIN HFA) 108 (90 BASE) MCG/ACT inhaler Inhale 1-2 puffs into the lungs every 6 (six) hours as needed for wheezing or shortness of breath. 11/13/14   Nat Christen, MD  allopurinol (ZYLOPRIM) 300 MG tablet Take 300 mg by mouth daily. 05/02/13   Historical Provider, MD  amLODipine (NORVASC) 10 MG tablet Take 1 tablet (10 mg total) by mouth daily. 09/20/14   Fayrene Helper, MD  aspirin 81 MG tablet Take 81 mg by mouth daily.    Historical Provider, MD  insulin NPH-regular Human (NOVOLIN 70/30) (70-30) 100 UNIT/ML injection Inject 60 Units into the skin 2 (two) times daily with a meal. 04/20/15   Erline Hau, MD  levothyroxine (SYNTHROID, LEVOTHROID) 75 MCG tablet Take 75 mcg by mouth every morning.     Historical Provider, MD  lisinopril (PRINIVIL,ZESTRIL) 40 MG tablet Take 1 tablet (40 mg total) by mouth daily. 09/20/14   Fayrene Helper, MD  metFORMIN (GLUCOPHAGE) 500 MG tablet Take 1 tablet (500 mg total) by mouth 2 (two) times daily. 09/02/13   Charolette Forward, MD  metoprolol (LOPRESSOR) 50 MG tablet Take 1 tablet (50 mg total) by mouth 2 (two) times daily. 12/25/14   Fayrene Helper, MD  pravastatin (PRAVACHOL) 40 MG tablet Take 1 tablet (40 mg total) by mouth daily. 09/20/14   Fayrene Helper, MD  spironolactone (ALDACTONE) 25 MG tablet Take 1 tablet (25 mg total) by mouth daily. 12/25/14   Fayrene Helper, MD  vitamin B-12 (CYANOCOBALAMIN) 1000 MCG tablet Take 1,000 mcg by mouth daily.    Historical Provider, MD     No Known Allergies  Social History:  reports that he has quit smoking. His smoking use  included Cigarettes. He has a 16.5 pack-year smoking history. He has never used smokeless tobacco. He reports that he does not drink alcohol or use illicit drugs.    Family History  Problem Relation Age of Onset  . Hypertension Mother   . Diabetes Mother   . Hypertension Father   . Diabetes Brother   . Arthritis      Family History   . Diabetes      family History   . Stroke Daughter        Physical Exam:  GEN:  Pleasant Obese Elderly 74 y.o. African American male examined and in no acute distress; cooperative with exam Filed Vitals:   05/10/15 0000 05/10/15 0030 05/10/15 0100 05/10/15 0130  BP: 112/56 114/62 114/70 132/70  Pulse: 86 89 97 86  Temp:  TempSrc:      Resp: 19     Height:      Weight:      SpO2: 94% 92% 94% 94%   Blood pressure 132/70, pulse 86, temperature 97.7 F (36.5 C), temperature source Oral, resp. rate 19, height 5' 10.5" (1.791 m), weight 107.956 kg (238 lb), SpO2 94 %. PSYCH: He is alert and oriented x4; does not appear anxious does not appear depressed; affect is normal HEENT: Normocephalic and Atraumatic, Mucous membranes pink; PERRLA; EOM intact; Fundi:  Benign;  No scleral icterus, Nares: Patent, Oropharynx: Clear, Edentulous or Fair Dentition,    Neck:  FROM, No Cervical Lymphadenopathy nor Thyromegaly or Carotid Bruit; No JVD; Breasts:: Not examined CHEST WALL: No tenderness CHEST: Diffuse Expiratory Wheezes NO rales NO rhonchi HEART: Regular rate and rhythm; no murmurs rubs or gallops BACK: No kyphosis or scoliosis; No CVA tenderness ABDOMEN: Positive Bowel Sounds, Obese, Soft Non-Tender, No Rebound or Guarding; No Masses, No Organomegaly, No Pannus; No Intertriginous candida. Rectal Exam: Not done EXTREMITIES: No Cyanosis, Clubbing, or Edema; No Ulcerations. Genitalia: not examined PULSES: 2+ and symmetric SKIN: Normal hydration no rash or ulceration  CNS:  Alert and Oriented x 4, Mental Status:  Alert, Oriented, Thought Content  Appropriate. Speech Fluent without evidence of Aphasia. Able to follow 3 step commands without difficulty.  In No obvious pain.   Cranial Nerves:  II: Discs flat bilaterally; Visual fields Intact, Pupils equal and reactive.    III,IV, VI: Extra-ocular motions intact bilaterally    V,VII: smile symmetric, facial light touch sensation normal bilaterally    VIII: hearing intact bilaterally    IX,X: gag reflex present    XI: bilateral shoulder shrug    XII: midline tongue extension   Motor:  Right:  Upper extremity 4/5     Left:  Upper extremity 5/5     Right:  Lower extremity 3/5    Left:  Lower extremity 5/5     Tone and Bulk:  normal tone throughout; no atrophy noted   Sensory:  Pinprick and light touch intact throughout, bilaterally   Deep Tendon Reflexes: 2+ and symmetric throughout   Plantars/ Babinski:  Right: equivocal        Left: normal    Cerebellar:  Finger to nose with difficulty using Right ARM  Gait: deferred    Vascular: pulses palpable throughout    Labs on Admission:  Basic Metabolic Panel:  Recent Labs Lab 05/07/15 1130 05/09/15 2355  NA 138 136  K 4.8 5.3*  CL 104 103  CO2 25 27  GLUCOSE 102* 113*  BUN 16 36*  CREATININE 1.19 1.60*  CALCIUM 9.6 9.2   Liver Function Tests:  Recent Labs Lab 05/07/15 1130 05/09/15 2355  AST 16 14*  ALT 10* 13*  ALKPHOS 67 72  BILITOT 0.6 0.5  PROT 6.9 7.6  ALBUMIN 3.4* 3.7   No results for input(s): LIPASE, AMYLASE in the last 168 hours. No results for input(s): AMMONIA in the last 168 hours. CBC:  Recent Labs Lab 05/09/15 2355  WBC 15.5*  NEUTROABS 11.0*  HGB 10.4*  HCT 33.0*  MCV 85.9  PLT 297   Cardiac Enzymes:  Recent Labs Lab 05/09/15 2355  TROPONINI <0.03    BNP (last 3 results)  Recent Labs  04/18/15 2200  BNP 22.0     3d ago  81mo ago  33mo ago        Cholesterol 0 - 200 mg/dL 147 128R 148CM  Triglycerides <150 mg/dL 223 (H) 201 (H) 724 (H)    HDL >40 mg/dL 24 (L)  =40 mg/dL" class="[email or web address]" style="cursor: pointer;" onmouseover='jscript: var varStyle="underline"; var bgColor="#[id number]"; this.style.backgroundColor=bgColor; var children=this.getElementsByTagName("div"); for(var child=0;child 24 (L)R =40 mg/dL" class="[email or web address]" style="cursor: pointer;" onmouseover='jscript: var varStyle="underline"; var bgColor="#[id number]"; this.style.backgroundColor=bgColor; var children=this.getElementsByTagName("div"); for(var child=0;child 17 (L)R, CM    Total CHOL/HDL Ratio RATIO 6.1 5.3 (H)R 8.7R    VLDL 0 - 40 mg/dL 45 (H) 40 (H)R NOT CALCCM    LDL Cholesterol 0 - 99 mg/dL 78 64R, CM NOT CALCCM   Comments:     Total Cholesterol/HDL:CHD Risk  Coronary Heart Disease Risk Table            Men  Women  1/2 Average Risk  3.4  3.3  Average Risk    5.0  4.4  2 X Average Risk  9.6  7.1  3 X Average Risk 23.4  11.0                  ProBNP (last 3 results) No results for input(s): PROBNP in the last 8760 hours.  CBG: No results for input(s): GLUCAP in the last 168 hours.  Radiological Exams on Admission: Ct Head Wo Contrast  05/10/2015  CLINICAL DATA:  74 year old male with right-sided weakness since started yesterday. EXAM: CT HEAD WITHOUT CONTRAST TECHNIQUE: Contiguous axial images were obtained from the base of the skull through the vertex without intravenous contrast. COMPARISON:  Head CT dated 06/23/2014 FINDINGS: There is slight prominence of the ventricles and sulci compatible with age-related volume loss. Mild periventricular and deep white matter hypodensities represent chronic microvascular ischemic changes. Small left basal ganglia old lacunar infarct. There is no intracranial hemorrhage. No mass effect or midline shift identified. The visualized paranasal sinuses and mastoid air cells are well aerated. The calvarium is intact. IMPRESSION: No acute intracranial hemorrhage. Mild age-related atrophy and chronic microvascular  ischemic disease. If symptoms persist and there are no contraindications, MRI may provide better evaluation if clinically indicated Electronically Signed   By: Anner Crete M.D.   On: 05/10/2015 01:12      EKG: Independently reviewed. Normal Sinus Rhythm rate = 87    Assessment/Plan:      74 y.o. male with  Principal Problem:   1.     Stroke [facility])   Telemetry Monitoring   Neuro Checks   MRI/MRA of Brain in AM   ASA Rx   Continue Pravastatin Rx   Check Lipids in AM   Neurology Consult in AM   Active Problems:   2.    COPD exacerbation (HCC)   DuoNebs   IV Steroids   O2 PRN   Monitor O2 sats     3.  AKI-   IVFs   Monitor BUN/Cr      4.    Coronary atherosclerosis   Stable on Metropolol, Lisinopril, and ASA      5.    Diabetes mellitus, insulin dependent (IDDM), uncontrolled (HCC)   SSI coverage PRN   Continue 70/30 Insulin BID   Last HbA1C =  7.1 on 12/19     6.    Hyperlipidemia with target LDL less than 70- LDL on 12/19 was 78   Continue Pravastatin Rx       7.    Essential hypertension   Continue Amlodipine, Metoprolol,Lisinorpril and Spironolactone Rx   Monitor BPs     8.    Hypothyroid   Continue Levothyroxine Rx     9.  GERD (gastroesophageal reflux disease)   IV Protonix     10.  Hyperkalemia- due to AKI, ACE inhibitor Rx, and Spironolactone   Hold Lisinopril, and Spironolactone Rx   Gentle IVFs   May Need Kayexalate Rx, Recheck this AM     11.    DVT Prophylaxis   Lovenox      Code Status:     FULL CODE        Family Communication:   Wife at Bedside   Disposition Plan:    Inpatient  Status        Time spent:  62 Minutes      Theressa Millard [facility] Pager [phone]   If [place] Please Contact the Day Rounding Team MD for [facility]  If 7PM-7AM, Please Contact Night-Floor Coverage  [email or web address] Password [email or web address] 05/10/2015, 1:50 AM     ADDENDUM:   Patient was seen and examined on 05/10/2015

## 2015-05-11 ENCOUNTER — Other Ambulatory Visit (HOSPITAL_COMMUNITY): Payer: Commercial Managed Care - HMO

## 2015-05-11 DIAGNOSIS — R079 Chest pain, unspecified: Secondary | ICD-10-CM | POA: Diagnosis not present

## 2015-05-11 LAB — GLUCOSE, CAPILLARY
GLUCOSE-CAPILLARY: 125 mg/dL — AB (ref 65–99)
GLUCOSE-CAPILLARY: 139 mg/dL — AB (ref 65–99)
Glucose-Capillary: 117 mg/dL — ABNORMAL HIGH (ref 65–99)
Glucose-Capillary: 147 mg/dL — ABNORMAL HIGH (ref 65–99)
Glucose-Capillary: 157 mg/dL — ABNORMAL HIGH (ref 65–99)
Glucose-Capillary: 182 mg/dL — ABNORMAL HIGH (ref 65–99)
Glucose-Capillary: 199 mg/dL — ABNORMAL HIGH (ref 65–99)

## 2015-05-11 LAB — CBC
HEMATOCRIT: 30.3 % — AB (ref 39.0–52.0)
Hemoglobin: 9.7 g/dL — ABNORMAL LOW (ref 13.0–17.0)
MCH: 27.2 pg (ref 26.0–34.0)
MCHC: 32 g/dL (ref 30.0–36.0)
MCV: 84.9 fL (ref 78.0–100.0)
Platelets: 300 10*3/uL (ref 150–400)
RBC: 3.57 MIL/uL — ABNORMAL LOW (ref 4.22–5.81)
RDW: 14.6 % (ref 11.5–15.5)
WBC: 14.8 10*3/uL — AB (ref 4.0–10.5)

## 2015-05-11 LAB — BASIC METABOLIC PANEL
ANION GAP: 6 (ref 5–15)
BUN: 34 mg/dL — AB (ref 6–20)
CALCIUM: 8.7 mg/dL — AB (ref 8.9–10.3)
CO2: 25 mmol/L (ref 22–32)
Chloride: 106 mmol/L (ref 101–111)
Creatinine, Ser: 1.21 mg/dL (ref 0.61–1.24)
GFR calc Af Amer: >60 mL/min (ref >60)
GFR, EST NON AFRICAN AMERICAN: 58 mL/min — AB (ref >60)
Glucose, Bld: 162 mg/dL — ABNORMAL HIGH (ref 65–99)
POTASSIUM: 4.9 mmol/L (ref 3.5–5.1)
SODIUM: 137 mmol/L (ref 135–145)

## 2015-05-11 MED ORDER — PANTOPRAZOLE SODIUM 40 MG PO TBEC
40.0000 mg | DELAYED_RELEASE_TABLET | Freq: Every day | ORAL | Status: DC
  Administered 2015-05-12: 40 mg via ORAL
  Filled 2015-05-11: qty 1

## 2015-05-11 MED ORDER — LEVETIRACETAM 500 MG PO TABS
500.0000 mg | ORAL_TABLET | Freq: Two times a day (BID) | ORAL | Status: DC
  Administered 2015-05-11 – 2015-05-12 (×3): 500 mg via ORAL
  Filled 2015-05-11 (×3): qty 1

## 2015-05-11 MED ORDER — SPIRONOLACTONE 25 MG PO TABS
25.0000 mg | ORAL_TABLET | Freq: Every day | ORAL | Status: DC
  Administered 2015-05-11 – 2015-05-12 (×2): 25 mg via ORAL
  Filled 2015-05-11 (×2): qty 1

## 2015-05-11 MED ORDER — INSULIN ASPART PROT & ASPART (70-30 MIX) 100 UNIT/ML ~~LOC~~ SUSP
50.0000 [IU] | Freq: Two times a day (BID) | SUBCUTANEOUS | Status: DC
  Filled 2015-05-11: qty 10

## 2015-05-11 MED ORDER — ALLOPURINOL 300 MG PO TABS
300.0000 mg | ORAL_TABLET | Freq: Every day | ORAL | Status: DC
  Administered 2015-05-11 – 2015-05-12 (×2): 300 mg via ORAL
  Filled 2015-05-11 (×2): qty 1

## 2015-05-11 MED ORDER — INSULIN ASPART 100 UNIT/ML ~~LOC~~ SOLN
0.0000 [IU] | Freq: Three times a day (TID) | SUBCUTANEOUS | Status: DC
  Administered 2015-05-11: 4 [IU] via SUBCUTANEOUS

## 2015-05-11 MED ORDER — VITAMIN B-12 1000 MCG PO TABS
1000.0000 ug | ORAL_TABLET | Freq: Every day | ORAL | Status: DC
  Administered 2015-05-11 – 2015-05-12 (×2): 1000 ug via ORAL
  Filled 2015-05-11 (×2): qty 1

## 2015-05-11 MED ORDER — PRAVASTATIN SODIUM 40 MG PO TABS
40.0000 mg | ORAL_TABLET | Freq: Every day | ORAL | Status: DC
  Administered 2015-05-11 – 2015-05-12 (×2): 40 mg via ORAL
  Filled 2015-05-11 (×2): qty 1

## 2015-05-11 MED ORDER — LEVOTHYROXINE SODIUM 75 MCG PO TABS
75.0000 ug | ORAL_TABLET | Freq: Every morning | ORAL | Status: DC
  Administered 2015-05-11 – 2015-05-12 (×2): 75 ug via ORAL
  Filled 2015-05-11 (×2): qty 1

## 2015-05-11 NOTE — Evaluation (Signed)
Notified by RN of syncope and dizziness.  Patient seen and examined, neurologic exam without change (RUE/RLE weakness and right pronator drift).  Wife reports multiple episodes of spontaneous syncope at home over last 3 weeks. Some shaking spells as well.  Suspect partial seizures. Will start [employer]. Continue telemetry. Follow-up further neurology recs.  Possibly home 12/24 if stable.  [name], MD [facility] [phone]

## 2015-05-11 NOTE — Progress Notes (Addendum)
PROGRESS NOTE  [name] [id number] DOB: 11-Jun-1940 DOA: 05/09/2015 PCP: Tula Nakayama, MD  Summary: 53yom PMH COPD, CAD s/pt stents, HTN DM type 2 presented with recurrent right-sided weakness UE/LE.    Assessment/Plan: 1. Acute left pontine infarct with RUE/RLE weakness MRI/MRA revealed acute lacunar infarct in the left paracentral pons, basilar artery perforator territory. Thought secondary to thrombosis or perforating vessel off basilar artery. ST evaluated the patient and recommends soft diet and regular liquids. LDL 78. HgbA1C 7.1. Neurology consulted and agrees with increasing dose of ASA. Will continue Prozac and follow up Acetylcholine receptor antibodies for workup of chronic dysphagia per neurology. Carotid doppler pending. He will need an outpatient EEG.  He may need an outpatient sleep study.  2. COPD exacerbation, resolved with IV steroids and bronchodilators. Stable on RA, continue to monitor. Will not need steroids as outpatient. 3. AKI, resolved with IVFs. 4. Coronary atherosclerosis, stable on Metoprolol, Lisinopril, and ASA. Increased ASA to full dose. 5. DM type 2, stable. Continue SSI 6. Hyperlipidemia, LDL 78, target <70. Will continue statin. 7. Essential HTN, stable. Continue home meds to resume 12/24. 8. Hypothyroid, continue Levothyroxine 9. GERD, PPI 10. Hyperkalemia, likely due to AKI, resolved with IVF.  11. Diet: D3/mech soft with thin liquids with use of aspiration and reflux precautions.  12. Vitamin B12 deficiency. 13. Chronic leukocytosis, unclear etiology. F/u as an outpatient.  14. Normocytic anemia, chronic, stable.    Overall improved. Follow up carotid dopplers and EEG  Outpatient OT and [place] PT.  Increased ASA.  F/u anemia and leukocytosis as an outpatient  Likely home later today when workup complete  Code Status: Full DVT prophylaxis: Lovenox Family Communication: Wife at bedside. Discussed care plan with them Disposition  Plan:    [name], MD  [facility]  Pager [phone] If 7PM-7AM, please contact night-coverage at [email or web address], password [facility] 05/11/2015, 8:26 AM  LOS: 1 day   Consultants:  ST- Dysphagia 3 (Mech soft) solids;Thin liquid  Neurology  PT- HH PT.   OT- outpatient OT.   Procedures:  ECHO Study Conclusions  - Left ventricle: The cavity size was normal. Wall thickness was increased in a pattern of moderate to severe LVH. Systolic function was vigorous. The estimated ejection fraction was in the range of 65% to 70%. Doppler parameters are consistent with abnormal left ventricular relaxation (grade 1 diastolic dysfunction)  Antibiotics:  none   HPI/Subjective: Feels good. Ambulated around floor today. No difficulty speaking or swallowing. Left-handed. Has some RUE/RLE weakness.  Objective: Filed Vitals:   05/11/15 0400 05/11/15 0412 05/11/15 0800 05/11/15 0801  BP: 114/50  125/62 125/62  Pulse: 81  73 73  Temp: 98.2 F (36.8 C)  97.7 F (36.5 C) 97.7 F (36.5 C)  TempSrc:   Oral Oral  Resp: 20  20 20   Height:      Weight:      SpO2:  94% 94% 92%    Intake/Output Summary (Last 24 hours) at 05/11/15 0826 Last data filed at 05/11/15 0548  Gross per 24 hour  Intake 1098.75 ml  Output   1750 ml  Net -651.25 ml     Filed Weights   05/09/15 2333 05/10/15 0255  Weight: 107.956 kg (238 lb) 107.049 kg (236 lb)    Exam:     VSS, afebrile, not hypoxic General:  Appears calm and comfortable Eyes: PERRL, normal lids, irises & conjunctiva ENT: grossly normal hearing, lips & tongue Cardiovascular: RRR, no m/r/g. No LE edema. Telemetry: SR,  no arrhythmias  Respiratory: CTA bilaterally, no w/r/r. Normal respiratory effort. Musculoskeletal: Right upper and lower extremity strength 4/5. Left upper and lower extremity strength 5/5. Psychiatric: grossly normal mood and affect, speech fluent and appropriate Neurologic: CN 2-12 intact, no pass  pointing however right slower than left. Right pronator drift, subtle.  New data reviewed:  Potassium 4.9, BUN 34, Creatinine improved 1.21  CBG stable  WBC 14.8, Hgb stable 9.7  Pertinent data since admission:  BMP shows normalization of creatinine  WBC 15.5  Head CT negative  MRI/MRA -  acute lacunar infarct in the left paracentral pons, basilar artery perforator territory. No associated hemorrhage or mass effect. Chronic small vessel ischemia in the deep gray matter nuclei, primarily the thalamostriate artery territory and intracranial artery dolichoectasia. No posterior circulation stenosis. Moderate stenosis of the left ICA siphon. No major circle of Willis branch occlusion.  Scheduled Meds: . aspirin  300 mg Rectal Daily   Or  . aspirin  325 mg Oral Daily  . enoxaparin (LOVENOX) injection  40 mg Subcutaneous Q24H  . insulin aspart  0-20 Units Subcutaneous 6 times per day  . pantoprazole (PROTONIX) IV  40 mg Intravenous Q24H   Continuous Infusions: . sodium chloride 75 mL/hr at 05/11/15 0745    Principal Problem:   Stroke [facility]) Active Problems:   Diabetes mellitus, insulin dependent (IDDM), uncontrolled ([place])   Hyperlipidemia with target LDL less than 70   Essential hypertension   Coronary atherosclerosis   Hypothyroid   COPD exacerbation (HCC)   AKI (acute kidney injury) ([place])   GERD (gastroesophageal reflux disease)   CVA (cerebral infarction)   Hyperkalemia    By signing my name below, I, Rosalie Doctor attest that this documentation has been prepared under the direction and in the presence of [name], MD Electronically signed: Rosalie Doctor, Scribe.  05/11/2015  I personally performed the services described in this documentation. All medical record entries made by the scribe were at my direction. I have reviewed the chart and agree that the record reflects my personal performance and is accurate and complete. [name], MD

## 2015-05-11 NOTE — Evaluation (Signed)
Occupational Therapy Evaluation Patient Details Name: [name] MRN: [id number] DOB: September 04, 1940 Today's Date: 05/11/2015    History of Present Illness [name] is a 74 y.o. male with a history of COPD, CAD S/P PCI with Stents, HTN, DM2 who presents to the ED with complaints of recurring Right sided weakness since yesterday (Tuesday 05/08/2015) at 3 PM. The symptoms started at 3 PM and resolved by 6PM, and his wife reports that she tried to have him go to the hospital at that time but he refused. The next day he reports that the symptoms of right sided weakness returned around 1 pm and were not improving. He presented to the ED this evening around 11 PM. A CT scan of the head was negative for acute findings and he was referred for a CVA versus TIA Workup   Clinical Impression   Pt awake, alert, and oriented this am, agreeable to evaluation. Pt demonstrates independence in bed mobility this am, pt requires some assistance at times with LB dressing at baseline due to difficulty reaching feet. Pt demonstrates BUE range of motion WFL, RUE strength is 3+/5, coordination and sensation intact. Recommend outpatient OT on discharge to work on strengthening and functional use of RUE during daily tasks. Pt is at baseline with ADL tasks, supervision at home would be ideal; pt reports wife is considering retirement in the near future. No further acute OT services required.     Follow Up Recommendations  Outpatient OT;Supervision - Intermittent    Equipment Recommendations  None recommended by OT       Precautions / Restrictions Precautions Precautions: Fall Restrictions Weight Bearing Restrictions: No      Mobility Bed Mobility Overal bed mobility: Independent                        ADL Overall ADL's : Needs assistance/impaired;At baseline Eating/Feeding: Independent       Upper Body Bathing: Independent;Sitting           Lower Body Dressing: Minimal  assistance;Sitting/lateral leans Lower Body Dressing Details (indicate cue type and reason): Pt has difficulty with LB dressing at baseline, he sits in chair and uses compensatory strategies to doff and donn socks. Wife assists when necessary                     Vision Vision Assessment?: Yes Eye Alignment: Within Functional Limits Ocular Range of Motion: Within Functional Limits Alignment/Gaze Preference: Within Defined Limits Tracking/Visual Pursuits: Able to track stimulus in all quads without difficulty Saccades: Within functional limits Convergence: Within functional limits Visual Fields: No apparent deficits          Pertinent Vitals/Pain Pain Assessment: No/denies pain     Hand Dominance Left   Extremity/Trunk Assessment Upper Extremity Assessment Upper Extremity Assessment: RUE deficits/detail RUE Deficits / Details: RUE range of motion WFL, strength 3+/5   Lower Extremity Assessment Lower Extremity Assessment: Defer to PT evaluation       Communication Communication Communication: No difficulties   Cognition Arousal/Alertness: Awake/alert Behavior During Therapy: WFL for tasks assessed/performed Overall Cognitive Status: Within Functional Limits for tasks assessed                                Home Living Family/patient expects to be discharged to:: Private residence Living Arrangements: Spouse/significant other Available Help at Discharge: Family  Bathroom Shower/Tub: Teacher, early years/pre: Standard     Home Equipment: Environmental consultant - 2 wheels;Cane - single point          Prior Functioning/Environment Level of Independence: Independent with assistive device(s)             OT Diagnosis: Generalized weakness (RUE)   OT Problem List: Decreased strength;Impaired UE functional use    End of Session    Activity Tolerance: Patient tolerated treatment well Patient left: in bed;with call bell/phone  within reach;with bed alarm set   Time: [id number] OT Time Calculation (min): 46 min Charges:  OT General Charges $OT Visit: 1 Procedure OT Evaluation $Initial OT Evaluation Tier I: 1 Procedure  Guadelupe Sabin, OTR/L  [phone]  05/11/2015, 8:58 AM

## 2015-05-11 NOTE — Evaluation (Signed)
Physical Therapy Evaluation Patient Details Name: [name] MRN: [id number] DOB: 15-Feb-1941 Today's Date: 05/11/2015   History of Present Illness  [name] is a 74 y.o. male with a history of COPD, CAD S/P PCI with Stents, HTN, DM2 who presents to the ED with complaints of recurring Right sided weakness since yesterday (Tuesday 05/08/2015) at 3 PM. The symptoms started at 3 PM and resolved by 6PM, and his wife reports that she tried to have him go to the hospital at that time but he refused. The next day he reports that the symptoms of right sided weakness returned around 1 pm and were not improving. He presented to the ED this evening around 11 PM. A CT scan of the head was negative for acute findings and he was referred for a CVA versus TIA Workup  Clinical Impression  Pt is I in bed mobility.  Ambulation is limited due to COPD more than weakness of LE.  Pt will benefit from skilled PT to return to prior functional level of ambulating with a cane.     Follow Up Recommendations Home health PT    Equipment Recommendations  None recommended by PT    Recommendations for Other Services   OT    Precautions / Restrictions Precautions Precautions: None Restrictions Weight Bearing Restrictions: No      Mobility  Bed Mobility Overal bed mobility: Modified Independent                Transfers Overall transfer level: Modified independent Equipment used: Rolling walker (2 wheeled)                Ambulation/Gait Ambulation/Gait assistance: Modified independent (Device/Increase time) Ambulation Distance (Feet): 70 Feet Assistive device: Rolling walker (2 wheeled) Gait Pattern/deviations: Decreased step length - right;Decreased step length - left Gait velocity: Pt has COPD and has walked slower due to this for years. Gait velocity interpretation: Below normal speed for age/gender    Stairs            Wheelchair Mobility    Modified  Rankin (Stroke Patients Only) Modified Rankin (Stroke Patients Only) Pre-Morbid Rankin Score: Moderate disability           Pertinent Vitals/Pain Pain Assessment: No/denies pain    Home Living Family/patient expects to be discharged to:: Private residence Living Arrangements: Spouse/significant other Available Help at Discharge: Family Type of Home: House Home Access:  (1 step small)     Home Layout: One level Home Equipment: Cane - single point;Walker - 4 wheels      Prior Function Level of Independence: Independent with assistive device(s)               Hand Dominance   Dominant Hand: Left    Extremity/Trunk Assessment   Upper Extremity Assessment: RUE deficits/detail RUE Deficits / Details: RUE range of motion WFL, strength 3+/5         Lower Extremity Assessment: Generalized weakness         Communication   Communication: No difficulties  Cognition Arousal/Alertness: Awake/alert Behavior During Therapy: WFL for tasks assessed/performed Overall Cognitive Status: Within Functional Limits for tasks assessed                      General Comments General comments (skin integrity, edema, etc.): sitting balance is good; standing static good (-) ;  Dynamic fair (+)    Exercises General Exercises - Lower Extremity Long Arc Quad: Right;10 reps;Seated Heel Slides: Right;10 reps  Hip ABduction/ADduction: Sidelying;10 reps Straight Leg Raises: Right;10 reps Mini-Sqauts: Both;10 reps;Supine (bridging)      Assessment/Plan    PT Assessment Patient needs continued PT services  PT Diagnosis Generalized weakness;Hemiplegia dominant side   PT Problem List Decreased strength;Decreased activity tolerance;Decreased balance  PT Treatment Interventions Gait training;Functional mobility training;Therapeutic exercise;Therapeutic activities   PT Goals (Current goals can be found in the Care Plan section) Acute Rehab PT Goals Patient Stated Goal: to go  home PT Goal Formulation: With patient Time For Goal Achievement: 05/13/15 Potential to Achieve Goals: Good    Frequency Min 3X/week   Barriers to discharge  none         End of Session Equipment Utilized During Treatment: Gait belt Activity Tolerance: Patient tolerated treatment well Patient left: in chair;with call bell/phone within reach;with family/visitor present           Time: [id number] PT Time Calculation (min) (ACUTE ONLY): 47 min   Charges:   PT Evaluation $Initial PT Evaluation Tier I: 1 Procedure PT Treatments $Therapeutic Exercise: 8-22 mins   PT G CodesRayetta Humphrey, PT CLT [phone] 05/11/2015, 10:15 AM

## 2015-05-11 NOTE — Progress Notes (Signed)
1400 Pt wife reported that patient had a coughing spell and passes out in the chair. VS stable Pt denies headache Neurological assessment stable.  Dr Sarajane Jews notified.  1500 wife called and stated that pt said he was feeling real dizzy.  VS remain stable.  Pt helped to bed Dr Sarajane Jews notified.

## 2015-05-11 NOTE — Progress Notes (Signed)
The patient is receiving Protonix for h/o GERD by the intravenous route.  Based on criteria approved by the Pharmacy and [place], the medication is being converted to the equivalent oral dose form.  These criteria include: -No Active GI bleeding -Able to tolerate diet of full liquids (or better) or tube feeding OR able to tolerate other medications by the oral or enteral route  If you have any questions about this conversion, please contact the Pharmacy Department (ext [phone]).  Thank you.  Ena Dawley, [facility] 05/11/2015 11:44 AM

## 2015-05-11 NOTE — Care Management Important Message (Signed)
Important Message  Patient Details  Name: [name] MRN: [id number] Date of Birth: [birth year]   Medicare Important Message Given:  Yes    Joylene Draft, RN 05/11/2015, 8:57 AM

## 2015-05-11 NOTE — Care Management Note (Signed)
Case Management Note  Patient Details  Name: [name] MRN: [id number] Date of Birth: August 04, 1940  Subjective/Objective:                  Pt is admitted with CVA. Pt lives with his wife and will return home at discharge. Pt is fairly independent with ADL's but does have a cane prn.  Action/Plan: Will continue to follow and arrange either [facility] services or outpt services.  Expected Discharge Date:  05/11/15               Expected Discharge Plan:  [place]  In-House Referral:  NA  Discharge planning Services  CM Consult  Post Acute Care Choice:    Choice offered to:     DME Arranged:    DME Agency:     HH Arranged:    HH Agency:     Status of Service:  In process, will continue to follow  Medicare Important Message Given:  Yes Date Medicare IM Given:    Medicare IM give by:    Date Additional Medicare IM Given:    Additional Medicare Important Message give by:     If discussed at [place] of Stay Meetings, dates discussed:    Additional Comments:  Joylene Draft, RN 05/11/2015, 8:57 AM

## 2015-05-12 ENCOUNTER — Inpatient Hospital Stay (HOSPITAL_COMMUNITY): Payer: Commercial Managed Care - HMO

## 2015-05-12 DIAGNOSIS — E1059 Type 1 diabetes mellitus with other circulatory complications: Secondary | ICD-10-CM

## 2015-05-12 DIAGNOSIS — G8191 Hemiplegia, unspecified affecting right dominant side: Secondary | ICD-10-CM

## 2015-05-12 DIAGNOSIS — E1065 Type 1 diabetes mellitus with hyperglycemia: Secondary | ICD-10-CM

## 2015-05-12 DIAGNOSIS — R569 Unspecified convulsions: Secondary | ICD-10-CM

## 2015-05-12 LAB — BASIC METABOLIC PANEL
Anion gap: 5 (ref 5–15)
BUN: 25 mg/dL — AB (ref 6–20)
CALCIUM: 8.6 mg/dL — AB (ref 8.9–10.3)
CHLORIDE: 106 mmol/L (ref 101–111)
CO2: 26 mmol/L (ref 22–32)
CREATININE: 1.08 mg/dL (ref 0.61–1.24)
GFR calc non Af Amer: >60 mL/min (ref >60)
GLUCOSE: 108 mg/dL — AB (ref 65–99)
Potassium: 4.6 mmol/L (ref 3.5–5.1)
Sodium: 137 mmol/L (ref 135–145)

## 2015-05-12 LAB — GLUCOSE, CAPILLARY
Glucose-Capillary: 106 mg/dL — ABNORMAL HIGH (ref 65–99)
Glucose-Capillary: 106 mg/dL — ABNORMAL HIGH (ref 65–99)

## 2015-05-12 MED ORDER — LEVETIRACETAM 500 MG PO TABS: 500.0000 mg | ORAL_TABLET | Freq: Two times a day (BID) | ORAL | Status: DC

## 2015-05-12 MED ORDER — ASPIRIN 325 MG PO TABS: 325.0000 mg | ORAL_TABLET | Freq: Every day | ORAL | Status: DC

## 2015-05-12 MED ORDER — PANTOPRAZOLE SODIUM 40 MG PO TBEC: 40.0000 mg | DELAYED_RELEASE_TABLET | Freq: Every day | ORAL | Status: DC

## 2015-05-12 NOTE — Progress Notes (Signed)
Patient with orders to be discharge home. Discharge instructions given, patient verbalized understanding. Prescriptions given. Patient stable. Patient left facility in private vehicle with spouse.

## 2015-05-12 NOTE — Discharge Summary (Signed)
Physician Discharge Summary  FABER GELTZ [id number] DOB: Oct 11, 1940 DOA: 05/09/2015  PCP: Tula Nakayama, MD  Admit date: 05/09/2015 Discharge date: 05/12/2015  Time spent: 35 minutes  Recommendations for Outpatient Follow-up:  1. Follow up with PCP in 1-2 weeks. 2. Follow up with neurologist Dr. Jannifer Franklin on 06/01/15 as scheduled. Started on keppra for possible partial seizures 3. Schedule outpatient EEG. Possible outpatient sleep study.  4. Lisinopril held due to recent renal failure. Restart when follow up with primary care physician if renal function stable   Discharge Diagnoses:  Principal Problem:   Stroke [facility]) Active Problems:   Diabetes mellitus, insulin dependent (IDDM), uncontrolled ([place])   Hyperlipidemia with target LDL less than 70   Essential hypertension   Coronary atherosclerosis   Hypothyroid   COPD exacerbation (HCC)   AKI (acute kidney injury) ([place])   GERD (gastroesophageal reflux disease)   CVA (cerebral infarction)   Hyperkalemia   Partial seizure ([place])   Right hemiparesis ([place])   Discharge Condition: Improved  Diet recommendation: Heart healthy  Filed Weights   05/09/15 2333 05/10/15 0255  Weight: 107.956 kg (238 lb) 107.049 kg (236 lb)    History of present illness:  74 y.o. male with a history of COPD, CAD S/P PCI with Stents, HTN, DM2 who presents to the ED with complaints of recurring Right sided weakness since 05/08/2015. The symptoms started at 3 PM and resolved by 6PM. The next day he reports that the symptoms of right sided weakness returned around 1 pm and were not improving.While in the ED, CT  head was negative for acute findings. He was admitted for further management and stroke workup.    Hospital Course:  Patient presented with right sided hemiparesis. Found to have an acute left pontine infarct on MRI/MRA.   ST evaluated the patient and recommended a soft diet and regular liquids. His labs revealed a LDL of 78 and HgbA1C of  7.1. Neurology consulted and agrees with increasing his dose of ASA to 324mg . He will continue Prozac and follow up Acetylcholine receptor antibodies for workup of chronic dysphagia per neurology. ECHO as below. Carotid dopplers revealed no significant stenosis bilaterally. He will need an outpatient EEG and possible outpatient sleep study.   Syncopal episode on 12/24. Patient reports hx of similar, all following a coughing spell. Yesterday he experienced shaking in his right arm prior to his syncope. There was concern for partial seizure, so he was started on Keppra and has not had any recurrence of episodes. He will continue Keppra 500mg  and follow up with neurology, Dr. Jannifer Franklin, on 06/01/15 as scheduled.   1. COPD exacerbation, resolved with IV steroids and bronchodilators. Stable on RA. No indication for steroids as outpatient. 2. AKI, resolved with IVFs. 3. Coronary atherosclerosis, stable on Metoprolol, Lisinopril, and ASA. Increased ASA to full dose. Will continue home medications.  4. DM type 2, stable. Continue home medications. 5. Hyperlipidemia, LDL 78, target <70. Will continue statin. 6. Essential HTN, stable. Continue home meds. 7. Hypothyroid, continue Levothyroxine. 8. GERD, PPI 9. Hyperkalemia, likely due to AKI, resolved with IVF.  10. Vitamin B12 deficiency. Follow up as outpatient.  11. Chronic leukocytosis, unclear etiology. F/u as an outpatient.  12. Normocytic anemia, chronic, stable.  Consultants:  ST- Dysphagia 3 (Mech soft) solids;Thin liquid  Neurology  PT- HH PT.   OT- outpatient OT.  Procedures:  ECHO Study Conclusions  - Left ventricle: The cavity size was normal. Wall thickness was increased in a pattern of moderate  to severe LVH. Systolic function was vigorous. The estimated ejection fraction was in the range of 65% to 70%. Doppler parameters are consistent with abnormal left ventricular relaxation (grade 1  diastolic dysfunction)  Discharge Exam: Filed Vitals:   05/12/15 0800 05/12/15 1200  BP: 128/64 133/64  Pulse: 82 82  Temp: 97.9 F (36.6 C) 97.5 F (36.4 C)  Resp: 18 18     General: NAD, looks comfortable  Cardiovascular: RRR, S1, S2   Respiratory: clear bilaterally, No wheezing, rales or rhonchi  Abdomen: soft, non tender, no distention , bowel sounds normal  Musculoskeletal: No edema b/l   Discharge Instructions   Discharge Instructions    Diet - low sodium heart healthy    Complete by:  As directed      Increase activity slowly    Complete by:  As directed           Current Discharge Medication List    START taking these medications   Details  levETIRAcetam (KEPPRA) 500 MG tablet Take 1 tablet (500 mg total) by mouth 2 (two) times daily. Qty: 60 tablet, Refills: 0      CONTINUE these medications which have CHANGED   Details  aspirin 325 MG tablet Take 1 tablet (325 mg total) by mouth daily.      CONTINUE these medications which have NOT CHANGED   Details  acetaminophen (TYLENOL) 500 MG tablet Take 500 mg by mouth every 6 (six) hours as needed for moderate pain (left thigh).    albuterol (PROVENTIL HFA;VENTOLIN HFA) 108 (90 BASE) MCG/ACT inhaler Inhale 1-2 puffs into the lungs every 6 (six) hours as needed for wheezing or shortness of breath. Qty: 1 Inhaler, Refills: 2    allopurinol (ZYLOPRIM) 300 MG tablet Take 300 mg by mouth daily.    amLODipine (NORVASC) 10 MG tablet Take 1 tablet (10 mg total) by mouth daily. Qty: 90 tablet, Refills: 1    insulin NPH-regular Human (NOVOLIN 70/30) (70-30) 100 UNIT/ML injection Inject 60 Units into the skin 2 (two) times daily with a meal. Qty: 10 mL, Refills: 2    levothyroxine (SYNTHROID, LEVOTHROID) 75 MCG tablet Take 75 mcg by mouth every morning.     metFORMIN (GLUCOPHAGE) 500 MG tablet Take 1 tablet (500 mg total) by mouth 2 (two) times daily. Qty: 60 tablet, Refills: 3    metoprolol (LOPRESSOR)  50 MG tablet Take 1 tablet (50 mg total) by mouth 2 (two) times daily. Qty: 180 tablet, Refills: 3   Associated Diagnoses: Atherosclerosis of native coronary artery of native heart without angina pectoris    pravastatin (PRAVACHOL) 40 MG tablet Take 1 tablet (40 mg total) by mouth daily. Qty: 90 tablet, Refills: 1    spironolactone (ALDACTONE) 25 MG tablet Take 1 tablet (25 mg total) by mouth daily. Qty: 90 tablet, Refills: 3   Associated Diagnoses: Atherosclerosis of native coronary artery of native heart without angina pectoris    vitamin B-12 (CYANOCOBALAMIN) 1000 MCG tablet Take 1,000 mcg by mouth daily.      STOP taking these medications     lisinopril (PRINIVIL,ZESTRIL) 40 MG tablet        No Known Allergies Follow-up Information    Follow up with [facility].   Contact information:   [address] [place] [address] [phone]        The results of significant diagnostics from this hospitalization (including imaging, microbiology, ancillary and laboratory) are listed below for reference.    Significant  Diagnostic Studies: Dg Chest 2 View  04/23/2015  CLINICAL DATA:  74 year old male with acute agitation, aggressive behavior. Recent falls. Initial encounter. EXAM: CHEST  2 VIEW COMPARISON:  04/18/2015 and earlier. FINDINGS: Stable cardiac size and mediastinal contours. Cardiac size at the upper limits of normal. Visualized tracheal air column is within normal limits. Mild apical and peripheral scarring in the upper lobes appear stable and similar to that on chest CTA 05/09/2013. No pneumothorax, pulmonary edema, pleural effusion or acute pulmonary opacity. No acute osseous abnormality identified. IMPRESSION: No acute cardiopulmonary abnormality. Electronically Signed   By: Genevie [name] M.D.   On: 04/23/2015 08:14   Dg Chest 2 View  04/18/2015  CLINICAL DATA:  74 year old male with worsening shortness of breath. EXAM: CHEST  2 VIEW COMPARISON:   Radiograph dated 11/13/2014 FINDINGS: The heart size and mediastinal contours are within normal limits. Both lungs are clear. The visualized skeletal structures are unremarkable. IMPRESSION: No active cardiopulmonary disease. Electronically Signed   By: Anner Crete M.D.   On: 04/18/2015 22:25   Ct Head Wo Contrast  05/10/2015  CLINICAL DATA:  74 year old male with right-sided weakness since started yesterday. EXAM: CT HEAD WITHOUT CONTRAST TECHNIQUE: Contiguous axial images were obtained from the base of the skull through the vertex without intravenous contrast. COMPARISON:  Head CT dated 06/23/2014 FINDINGS: There is slight prominence of the ventricles and sulci compatible with age-related volume loss. Mild periventricular and deep white matter hypodensities represent chronic microvascular ischemic changes. Small left basal ganglia old lacunar infarct. There is no intracranial hemorrhage. No mass effect or midline shift identified. The visualized paranasal sinuses and mastoid air cells are well aerated. The calvarium is intact. IMPRESSION: No acute intracranial hemorrhage. Mild age-related atrophy and chronic microvascular ischemic disease. If symptoms persist and there are no contraindications, MRI may provide better evaluation if clinically indicated Electronically Signed   By: Anner Crete M.D.   On: 05/10/2015 01:12   Ct Head Wo Contrast  04/23/2015  CLINICAL DATA:  74 year old male with agitation, aggressive behavior this morning. Recent falls. Initial encounter. EXAM: CT HEAD WITHOUT CONTRAST TECHNIQUE: Contiguous axial images were obtained from the base of the skull through the vertex without intravenous contrast. COMPARISON:  04/20/2015 and earlier. FINDINGS: Visualized paranasal sinuses and mastoids are clear. No scalp hematoma. Visualized orbit soft tissues are within normal limits. No acute osseous abnormality identified. Calcified atherosclerosis at the skull base. Cerebral volume  remains within normal limits for age. Chronic bilateral thalamic lacunar infarcts appear stable since 2011. Elsewhere normal for age gray-white matter differentiation. No cortically based acute infarct identified. No ventriculomegaly. No midline shift, mass effect, or evidence of intracranial mass lesion. No acute intracranial hemorrhage identified. No suspicious intracranial vascular hyperdensity. IMPRESSION: No acute intracranial abnormality. Chronic lacunar infarcts in the bilateral thalami. Electronically Signed   By: Genevie [name] M.D.   On: 04/23/2015 08:17   Ct Head Wo Contrast  04/20/2015  CLINICAL DATA:  [place] tonight. Amnestic for the event. Now with left-sided head and neck pain. EXAM: CT HEAD WITHOUT CONTRAST CT CERVICAL SPINE WITHOUT CONTRAST TECHNIQUE: Multidetector CT imaging of the head and cervical spine was performed following the standard protocol without intravenous contrast. Multiplanar CT image reconstructions of the cervical spine were also generated. COMPARISON:  10/03/2009 FINDINGS: CT HEAD FINDINGS There is no intracranial hemorrhage or extra-axial fluid collection there is moderate generalized atrophy. There is remote lacunar infarction in the right thalamus and left subinsular region. Mild white matter hypodensity is consistent with  chronic small vessel disease. No acute intracranial findings are evident, and there is no significant interval change. Calvarium and skullbase are intact. CT CERVICAL SPINE FINDINGS The vertebral column, pedicles and facet articulations are intact. There is no evidence of acute fracture. No acute soft tissue abnormalities are evident. Moderate degenerative disc changes are present from C3 through C6. IMPRESSION: 1. Negative for acute intracranial traumatic injury. Remote lacunar infarctions, mild chronic small vessel disease and moderate generalized atrophy are present. 2. Negative for acute cervical spine fracture. Electronically Signed   By: Andreas Newport  M.D.   On: 04/20/2015 21:26   Ct Cervical Spine Wo Contrast  04/20/2015  CLINICAL DATA:  [place] tonight. Amnestic for the event. Now with left-sided head and neck pain. EXAM: CT HEAD WITHOUT CONTRAST CT CERVICAL SPINE WITHOUT CONTRAST TECHNIQUE: Multidetector CT imaging of the head and cervical spine was performed following the standard protocol without intravenous contrast. Multiplanar CT image reconstructions of the cervical spine were also generated. COMPARISON:  10/03/2009 FINDINGS: CT HEAD FINDINGS There is no intracranial hemorrhage or extra-axial fluid collection there is moderate generalized atrophy. There is remote lacunar infarction in the right thalamus and left subinsular region. Mild white matter hypodensity is consistent with chronic small vessel disease. No acute intracranial findings are evident, and there is no significant interval change. Calvarium and skullbase are intact. CT CERVICAL SPINE FINDINGS The vertebral column, pedicles and facet articulations are intact. There is no evidence of acute fracture. No acute soft tissue abnormalities are evident. Moderate degenerative disc changes are present from C3 through C6. IMPRESSION: 1. Negative for acute intracranial traumatic injury. Remote lacunar infarctions, mild chronic small vessel disease and moderate generalized atrophy are present. 2. Negative for acute cervical spine fracture. Electronically Signed   By: Andreas Newport M.D.   On: 04/20/2015 21:26   Mr [name] Head Wo Contrast  05/10/2015  CLINICAL DATA:  74 year old male with right side weakness for 2 days. Initial encounter. EXAM: MRI HEAD WITHOUT CONTRAST MRA HEAD WITHOUT CONTRAST TECHNIQUE: Multiplanar, multiecho pulse sequences of the brain and surrounding structures were obtained without intravenous contrast. Angiographic images of the head were obtained using MRA technique without contrast. COMPARISON:  Noncontrast head CT 0026 hours today. FINDINGS: MRI HEAD FINDINGS Linear 10 mm  area of restricted diffusion in the left paracentral pons (series 100, image 12) associated with T2 and FLAIR hyperintensity. No associated hemorrhage or mass effect. Major intracranial vascular flow voids are preserved, with generalized intracranial artery dolichoectasia. No other restricted diffusion. Chronic lacunar infarcts in the bilateral thalami. Chronic lacunar infarct in the left external capsule. No cortical encephalomalacia. Comparatively mild patchy mostly periatrial cerebral white matter T2 and FLAIR hyperintensity. No chronic cerebral blood products identified. No midline shift, mass effect, evidence of mass lesion, ventriculomegaly, extra-axial collection or acute intracranial hemorrhage. Cervicomedullary junction and pituitary are within normal limits. Negative visualized cervical spine. Visible internal auditory structures appear normal. Paranasal sinuses and mastoids are clear. Negative orbit and scalp soft tissues. MRA HEAD FINDINGS Antegrade flow in both distal vertebral arteries which appear codominant. Irregularity but no distal vertebral artery stenosis. Both PICA origins remain patent. Patent vertebrobasilar junction. Basilar irregularity but no stenosis. SCA, AICA, SCA, and PCA origins are patent. Posterior communicating arteries are diminutive or absent. There is mild irregularity in the distal PCA P2 segments with preserved distal flow. Antegrade flow in both ICA siphons with irregularity maximal in the distal cavernous segments and anterior genu regions greater on the left. Up to moderate left  anterior genu stenosis. Still, both carotid termini remain patent. Bilateral MCA and ACA origins are within normal limits. Tortuous A1 and M1 segments. Anterior communicating artery and visualized ACA branches are within normal limits, tortuous A2 segments. MCA M1 segments and bifurcations are patent with no stenosis identified. Visualized bilateral MCA branches are within normal limits. IMPRESSION:  1. Acute lacunar infarct in the left paracentral pons, basilar artery perforator territory. No associated hemorrhage or mass effect. 2. Chronic small vessel ischemia in the deep gray matter nuclei, primarily the thalamostriate artery territory. 3. Intracranial artery dolichoectasia. No posterior circulation stenosis. Moderate stenosis of the left ICA siphon. No major circle of Willis branch occlusion. Electronically Signed   By: Genevie [name] M.D.   On: 05/10/2015 12:12   Mr Brain Wo Contrast  05/10/2015  CLINICAL DATA:  74 year old male with right side weakness for 2 days. Initial encounter. EXAM: MRI HEAD WITHOUT CONTRAST MRA HEAD WITHOUT CONTRAST TECHNIQUE: Multiplanar, multiecho pulse sequences of the brain and surrounding structures were obtained without intravenous contrast. Angiographic images of the head were obtained using MRA technique without contrast. COMPARISON:  Noncontrast head CT 0026 hours today. FINDINGS: MRI HEAD FINDINGS Linear 10 mm area of restricted diffusion in the left paracentral pons (series 100, image 12) associated with T2 and FLAIR hyperintensity. No associated hemorrhage or mass effect. Major intracranial vascular flow voids are preserved, with generalized intracranial artery dolichoectasia. No other restricted diffusion. Chronic lacunar infarcts in the bilateral thalami. Chronic lacunar infarct in the left external capsule. No cortical encephalomalacia. Comparatively mild patchy mostly periatrial cerebral white matter T2 and FLAIR hyperintensity. No chronic cerebral blood products identified. No midline shift, mass effect, evidence of mass lesion, ventriculomegaly, extra-axial collection or acute intracranial hemorrhage. Cervicomedullary junction and pituitary are within normal limits. Negative visualized cervical spine. Visible internal auditory structures appear normal. Paranasal sinuses and mastoids are clear. Negative orbit and scalp soft tissues. MRA HEAD FINDINGS Antegrade flow in  both distal vertebral arteries which appear codominant. Irregularity but no distal vertebral artery stenosis. Both PICA origins remain patent. Patent vertebrobasilar junction. Basilar irregularity but no stenosis. SCA, AICA, SCA, and PCA origins are patent. Posterior communicating arteries are diminutive or absent. There is mild irregularity in the distal PCA P2 segments with preserved distal flow. Antegrade flow in both ICA siphons with irregularity maximal in the distal cavernous segments and anterior genu regions greater on the left. Up to moderate left anterior genu stenosis. Still, both carotid termini remain patent. Bilateral MCA and ACA origins are within normal limits. Tortuous A1 and M1 segments. Anterior communicating artery and visualized ACA branches are within normal limits, tortuous A2 segments. MCA M1 segments and bifurcations are patent with no stenosis identified. Visualized bilateral MCA branches are within normal limits. IMPRESSION: 1. Acute lacunar infarct in the left paracentral pons, basilar artery perforator territory. No associated hemorrhage or mass effect. 2. Chronic small vessel ischemia in the deep gray matter nuclei, primarily the thalamostriate artery territory. 3. Intracranial artery dolichoectasia. No posterior circulation stenosis. Moderate stenosis of the left ICA siphon. No major circle of Willis branch occlusion. Electronically Signed   By: Genevie [name] M.D.   On: 05/10/2015 12:12   Dg Esophagus  04/19/2015  CLINICAL DATA:  Difficulty swallowing solid foods and large pills, coughing episodes with eating and drinking EXAM: ESOPHOGRAM / BARIUM SWALLOW / BARIUM TABLET STUDY TECHNIQUE: Combined double contrast and single contrast examination performed using effervescent crystals, thick barium liquid, and thin barium liquid. The patient was observed with  fluoroscopy swallowing a 13 mm barium sulphate tablet. FLUOROSCOPY TIME:  Radiation Exposure Index (as provided by the fluoroscopic  device): Not provided If the device does not provide the exposure index: Fluoroscopy Time:  1 minutes 42 seconds Number of Acquired Images: 16 plus multiple screen captures during fluoroscopy COMPARISON:  None FINDINGS: Normal esophageal distention. No esophageal mass or stricture. 12.5 mm diameter barium tablet passed from oral cavity to stomach without delay. No persistent intraluminal filling defects. Smooth appearance of esophageal walls on air contrast imaging without irregularity or ulceration. No hiatal hernia. Diffuse age-related impairment of esophageal motility. Targeted rapid sequence imaging of the cervical esophagus and hypopharynx demonstrated laryngeal penetration with contrast extending to the vocal cords. A subtle amount of contrast is seen aspirated below the vocal cords along the anterior wall of the proximal trachea without identification of a spontaneous cough. When the patient swallowed the barium tablet with water, he experienced a marked coughing episode which persisted for 20-30 seconds, clinically suspect nonvisualized aspiration (water not fluoroscopically identifiable). Vallecular and piriform sinus residuals were noted. IMPRESSION: Laryngeal penetration and aspiration of contrast with an additional episode of prolonged strong coughing when swallowing water with the barium tablet likely representing additional nonvisualized aspiration. Electronically Signed   By: Lavonia Dana M.D.   On: 04/19/2015 16:14   US Carotid Bilateral  05/12/2015  CLINICAL DATA:  Acute left pontine infarct. EXAM: BILATERAL CAROTID DUPLEX ULTRASOUND TECHNIQUE: Pearline Cables scale imaging, color Doppler and duplex ultrasound were performed of bilateral carotid and vertebral arteries in the neck. COMPARISON:  05/09/2001 FINDINGS: Criteria: Quantification of carotid stenosis is based on velocity parameters that correlate the residual internal carotid diameter with NASCET-based stenosis levels, using the diameter of the  distal internal carotid lumen as the denominator for stenosis measurement. The following velocity measurements were obtained: RIGHT ICA:  103/27 cm/sec CCA:  [id number] cm/sec SYSTOLIC ICA/CCA RATIO:  [id number] DIASTOLIC ICA/CCA RATIO:  [id number] ECA:  204 cm/sec LEFT ICA:  98/25 cm/sec CCA:  [id number] cm/sec SYSTOLIC ICA/CCA RATIO:  [id number] DIASTOLIC ICA/CCA RATIO:  2.3 ECA:  107 cm/sec RIGHT CAROTID ARTERY: Minor echogenic shadowing plaque formation. No hemodynamically significant right ICA stenosis, velocity elevation, or turbulent flow. Degree of narrowing less than 50%. RIGHT VERTEBRAL ARTERY:  Antegrade LEFT CAROTID ARTERY: Similar scattered minor echogenic plaque formation. No hemodynamically significant left ICA stenosis, velocity elevation, or turbulent flow. LEFT VERTEBRAL ARTERY:  Antegrade IMPRESSION: Minor carotid atherosclerosis. No hemodynamically significant ICA stenosis. Degree of narrowing less than 50% bilaterally. Electronically Signed   By: Jerilynn Mages.  Shick M.D.   On: 05/12/2015 11:24   Nm Myocar Multi W/spect W/wall Motion / Ef  05/07/2015  CLINICAL DATA:  Diabetes, hypertension, previous myocardial infarction. EXAM: MYOCARDIAL IMAGING WITH SPECT (REST AND PHARMACOLOGIC-STRESS) GATED LEFT VENTRICULAR WALL MOTION STUDY LEFT VENTRICULAR EJECTION FRACTION TECHNIQUE: Standard myocardial SPECT imaging was performed after resting intravenous injection of 10 mCi Tc-57m sestamibi. Subsequently, intravenous infusion of Lexiscan was performed under the supervision of the Cardiology staff. At peak effect of the drug, 30 mCi Tc-7m sestamibi was injected intravenously and standard myocardial SPECT imaging was performed. Quantitative gated imaging was also performed to evaluate left ventricular wall motion, and estimate left ventricular ejection fraction. COMPARISON:  08/24/2013 FINDINGS: Perfusion: Medium sized area of moderately decreased activity involving the inferior wall of the left ventricle extending to the inferoseptal  region and inferoapical region. There is a moderate amount of subdiaphragmatic activity which degrades the study. No suggestion of reversible component to suggest ischemia. Wall Motion:  Normal left ventricular wall motion. No left ventricular dilation. Left Ventricular Ejection Fraction: 56 % End diastolic volume 96 ml End systolic volume 42 ml IMPRESSION: 1. No reversible ischemia.  Inferior wall attenuation or scar. 2. Normal left ventricular wall motion. 3. Left ventricular ejection fraction 56% (previously 58%). 4. LOW-risk stress test findings*. *2012 Appropriate Use Criteria for Coronary Revascularization Focused Update: J Am Coll Cardiol. [id number]. [email or web address] Electronically Signed   By: Lucrezia Europe M.D.   On: 05/07/2015 14:33   Dg Knee Complete 4 Views Left  04/20/2015  CLINICAL DATA:  Anterior knee pain.  Syncopal episode with fall. EXAM: LEFT KNEE - COMPLETE 4+ VIEW COMPARISON:  None. FINDINGS: The mineralization and alignment are normal. There is no evidence of acute fracture or dislocation. Minimal medial compartment joint space loss. Mild spurring at the quadriceps insertion on the patella. No significant joint effusion. There is atherosclerosis of the femoral and popliteal arteries. IMPRESSION: No acute osseous findings. Mild medial compartment degenerative changes. Electronically Signed   By: Richardean Sale M.D.   On: 04/20/2015 21:43   Dg Swallowing Func-speech Pathology  05/10/2015  Ephraim Hamburger, CCC-SLP     05/10/2015  5:34 PM Objective Swallowing Evaluation: MBS-Modified Barium Swallow Study Patient Details Name: [name] MRN: [id number] Date of Birth: [birth year] Today's Date: 05/10/2015 Time: SLP Start Time (ACUTE ONLY): 1613-SLP Stop Time (ACUTE ONLY): 1643 SLP Time Calculation (min) (ACUTE ONLY): 30 min Past Medical History: Past Medical History Diagnosis Date . GERD (gastroesophageal reflux disease)  . CAD (coronary  artery disease)  . Obesity  . Hyperlipidemia  . Hypertension  . Chronic back pain  . Gout  . Chronic bronchitis ([place])    "get it q yr" . Psoriasis  . COPD (chronic obstructive pulmonary disease) ([place])  . Myocardial infarction ([place]) 1999 . Pneumonia    "a few times; last time was ~ 06/2013" . On home oxygen therapy    "2L; only at night" (08/30/2013) . Diabetes mellitus, type 1  . Arthritis of knee  . Arthritis    "left leg" (08/30/2013) . Vitamin B12 deficiency 05/01/2015 . Abnormality of gait 05/01/2015 . Tussive syncope 05/01/2015 Past Surgical History: Past Surgical History Procedure Laterality Date . Appendectomy   . Colonoscopy  03/17/2012   Procedure: COLONOSCOPY;  Surgeon: Rogene Houston, MD;  [facility];  Service: Endoscopy;  Laterality: N/A;  830 . Cystoscopy with urethral dilatation N/A 11/26/2012   Procedure: CYSTOSCOPY WITH URETHRAL DILATATION;  Surgeon: Malka So, MD;  Location: [facility] ORS;  Service: Urology;  Laterality: N/A; . Coronary angioplasty with stent placement  1999- 2009-08/30/2013   "counting today's, I have 5 stents" (08/30/2013) . Left heart catheterization with coronary angiogram N/A 08/30/2013   Procedure: LEFT HEART CATHETERIZATION WITH CORONARY ANGIOGRAM;  Surgeon: Clent Demark, MD;  Location: [facility] CATH LAB;  Service: Cardiovascular;  Laterality: N/A; . Percutaneous coronary stent intervention (pci-s) Right 08/30/2013   Procedure: PERCUTANEOUS CORONARY STENT INTERVENTION (PCI-S);  Surgeon: Clent Demark, MD;  Location: [facility] CATH LAB;  Service: Cardiovascular;  Laterality: Right; HPI: Mr. Sklyer Bage is a 74 yo male who was admitted yesterday with symptoms consistent with stroke. MRI showed acute lacunar infarct in the left paracentral pons, basilar artery perforator territory. No associated hemorrage or mass effect; Chronic small vessle ischemia in the deep gray matter nuclei, primarily the thalamostriate artery territory; Intracranial artery dolichoectasia. Moderate stenosis  of the left ICA siphon. No major circle of Willis branch occlusion. Mr. Harbottle  is known to this SLP from previous hospitalization a few weeks ago for COPD exacerbation and c/o of frequently choking during meals. A barium swallow was completed during that hospitalization which showed: Laryngeal penetration and aspiration of contrast with an additional episode of prolonged strong coughing when swallowing water with the barium tablet likely representing additional nonvisualized aspiration. MBSS recommended due to ongoing  c/o dysphagia now in setting of CVA. Subjective: Pt alert and cooperative, up in chair for MBSS Assessment / Plan / Recommendation CHL IP CLINICAL IMPRESSIONS 05/10/2015 Therapy Diagnosis WFL;Mild pharyngeal phase dysphagia Clinical Impression Mr. Lapid demonstrated mild pharyngeal phase dysphagia characterized by premature spillage with delay in swallow initiation with thin liquids triggering after filling the pyriforms with cup and straw sips; mild post swallow residuals noted in the pyriforms and lateral channels which cleared with cued repeat/dry swallow. No penetration or aspiration observed. Swallow function WNL for solid textures, however pt with subjective report of difficulty with dry, crunchy foods which elicit cough response. Pt with history of COPD and was recently hospitalized for the same and barium swallow completed during that hospitalization demonstrated aspiration of barium contrast (breathing was compromised during that stay). Recommend D3/mech soft with thin liquids with use of aspiration and reflux precautions. Pt to take small bites/sips, swallow 2x for each bite/sip, and avoid talking during meals.  Impact on safety and function Mild aspiration risk   CHL IP TREATMENT RECOMMENDATION 05/10/2015 Treatment Recommendations No treatment recommended at this time   Prognosis 05/10/2015 Prognosis for Safe Diet Advancement Good Barriers to Reach Goals -- Barriers/Prognosis Comment  h/o COPD CHL IP DIET RECOMMENDATION 05/10/2015 SLP Diet Recommendations Dysphagia 3 (Mech soft) solids;Thin liquid Liquid Administration via Cup Medication Administration Whole meds with liquid Compensations Slow rate;Small sips/bites;Multiple dry swallows after each bite/sip Postural Changes Remain semi-upright after after feeds/meals (Comment);Seated upright at 90 degrees   CHL IP OTHER RECOMMENDATIONS 05/10/2015 Recommended Consults -- Oral Care Recommendations Oral care BID Other Recommendations Clarify dietary restrictions   CHL IP FOLLOW UP RECOMMENDATIONS 05/10/2015 Follow up Recommendations None   No flowsheet data found.     CHL IP ORAL PHASE 05/10/2015 Oral Phase WFL Oral - Pudding Teaspoon -- Oral - Pudding Cup -- Oral - Honey Teaspoon -- Oral - Honey Cup -- Oral - Nectar Teaspoon -- Oral - Nectar Cup -- Oral - Nectar Straw -- Oral - Thin Teaspoon -- Oral - Thin Cup -- Oral - Thin Straw -- Oral - Puree -- Oral - Mech Soft -- Oral - Regular -- Oral - Multi-Consistency -- Oral - Pill -- Oral Phase - Comment --  CHL IP PHARYNGEAL PHASE 05/10/2015 Pharyngeal Phase Impaired Pharyngeal- Pudding Teaspoon -- Pharyngeal -- Pharyngeal- Pudding Cup -- Pharyngeal -- Pharyngeal- Honey Teaspoon -- Pharyngeal -- Pharyngeal- Honey Cup -- Pharyngeal -- Pharyngeal- Nectar Teaspoon -- Pharyngeal -- Pharyngeal- Nectar Cup -- Pharyngeal -- Pharyngeal- Nectar Straw -- Pharyngeal -- Pharyngeal- Thin Teaspoon -- Pharyngeal -- Pharyngeal- Thin Cup Delayed swallow initiation-vallecula;Delayed swallow initiation-pyriform sinuses;Pharyngeal residue - pyriform Pharyngeal -- Pharyngeal- Thin Straw Delayed swallow initiation-pyriform sinuses;Pharyngeal residue - pyriform;Lateral channel residue Pharyngeal -- Pharyngeal- Puree WFL;Delayed swallow initiation-vallecula Pharyngeal -- Pharyngeal- Mechanical Soft -- Pharyngeal -- Pharyngeal- Regular Delayed swallow initiation-vallecula;WFL Pharyngeal -- Pharyngeal- Multi-consistency --  Pharyngeal -- Pharyngeal- Pill WFL Pharyngeal -- Pharyngeal Comment --  CHL IP CERVICAL ESOPHAGEAL PHASE 05/10/2015 Cervical Esophageal Phase WFL Pudding Teaspoon -- Pudding Cup -- Honey Teaspoon -- Honey Cup -- Nectar Teaspoon -- Nectar Cup -- Nectar Straw -- Thin Teaspoon -- Thin  Cup -- Thin Straw -- Puree -- Mechanical Soft -- Regular -- Multi-consistency -- Pill -- Cervical Esophageal Comment -- Thank you, Genene Churn, CCC-SLP [phone] PORTER,DABNEY 05/10/2015, 5:34 PM                Labs: Basic Metabolic Panel:  Recent Labs Lab 05/07/15 1130 05/09/15 2355 05/11/15 0530 05/12/15 0604  NA 138 136 137 137  K 4.8 5.3* 4.9 4.6  CL 104 103 106 106  CO2 25 27 25 26   GLUCOSE 102* 113* 162* 108*  BUN 16 36* 34* 25*  CREATININE 1.19 1.60* 1.21 1.08  CALCIUM 9.6 9.2 8.7* 8.6*   Liver Function Tests:  Recent Labs Lab 05/07/15 1130 05/09/15 2355  AST 16 14*  ALT 10* 13*  ALKPHOS 67 72  BILITOT 0.6 0.5  PROT 6.9 7.6  ALBUMIN 3.4* 3.7    CBC:  Recent Labs Lab 05/09/15 2355 05/11/15 0530  WBC 15.5* 14.8*  NEUTROABS 11.0*  --   HGB 10.4* 9.7*  HCT 33.0* 30.3*  MCV 85.9 84.9  PLT 297 300   Cardiac Enzymes:  Recent Labs Lab 05/09/15 2355  TROPONINI <0.03   BNP: BNP (last 3 results)  Recent Labs  04/18/15 2200  BNP 22.0     CBG:  Recent Labs Lab 05/11/15 1428 05/11/15 1604 05/11/15 2148 05/12/15 0757 05/12/15 1154  GLUCAP 147* 139* 182* 106* 106*       Signed:  Benney Sommerville. MD  [facility] 05/12/2015, 1:30 PM   By signing my name below, I, Rosalie Doctor, attest that this documentation has been prepared under the direction and in the presence of [facility]. MD Electronically Signed: Rosalie Doctor, Scribe. 05/12/2015  I, Dr. Kathie Dike, personally performed the services described in this documentaiton. All medical record entries made by the scribe were at my direction and in my presence. I have reviewed the chart and  agree that the record reflects my personal performance and is accurate and complete  Kathie Dike, MD, 05/12/2015 1:30 PM

## 2015-05-14 ENCOUNTER — Encounter (HOSPITAL_COMMUNITY): Payer: Self-pay | Admitting: Emergency Medicine

## 2015-05-14 ENCOUNTER — Emergency Department (HOSPITAL_COMMUNITY)
Admission: EM | Admit: 2015-05-14 | Discharge: 2015-05-14 | Disposition: A | Discharge status: Home / Self Care | Payer: Commercial Managed Care - HMO | Source: Home / Self Care | Attending: Emergency Medicine | Admitting: Emergency Medicine

## 2015-05-14 ENCOUNTER — Emergency Department (HOSPITAL_COMMUNITY): Payer: Commercial Managed Care - HMO

## 2015-05-14 DIAGNOSIS — R269 Unspecified abnormalities of gait and mobility: Secondary | ICD-10-CM | POA: Diagnosis not present

## 2015-05-14 DIAGNOSIS — Z7982 Long term (current) use of aspirin: Secondary | ICD-10-CM

## 2015-05-14 DIAGNOSIS — Z9889 Other specified postprocedural states: Secondary | ICD-10-CM | POA: Insufficient documentation

## 2015-05-14 DIAGNOSIS — R4781 Slurred speech: Secondary | ICD-10-CM | POA: Diagnosis not present

## 2015-05-14 DIAGNOSIS — I639 Cerebral infarction, unspecified: Secondary | ICD-10-CM

## 2015-05-14 DIAGNOSIS — R531 Weakness: Secondary | ICD-10-CM | POA: Diagnosis not present

## 2015-05-14 DIAGNOSIS — E109 Type 1 diabetes mellitus without complications: Secondary | ICD-10-CM

## 2015-05-14 DIAGNOSIS — Z9981 Dependence on supplemental oxygen: Secondary | ICD-10-CM

## 2015-05-14 DIAGNOSIS — R29810 Facial weakness: Secondary | ICD-10-CM | POA: Diagnosis not present

## 2015-05-14 DIAGNOSIS — E785 Hyperlipidemia, unspecified: Secondary | ICD-10-CM

## 2015-05-14 DIAGNOSIS — Z794 Long term (current) use of insulin: Secondary | ICD-10-CM | POA: Insufficient documentation

## 2015-05-14 DIAGNOSIS — E538 Deficiency of other specified B group vitamins: Secondary | ICD-10-CM

## 2015-05-14 DIAGNOSIS — J441 Chronic obstructive pulmonary disease with (acute) exacerbation: Secondary | ICD-10-CM

## 2015-05-14 DIAGNOSIS — E669 Obesity, unspecified: Secondary | ICD-10-CM | POA: Insufficient documentation

## 2015-05-14 DIAGNOSIS — G8220 Paraplegia, unspecified: Secondary | ICD-10-CM

## 2015-05-14 DIAGNOSIS — I10 Essential (primary) hypertension: Secondary | ICD-10-CM

## 2015-05-14 DIAGNOSIS — Z8701 Personal history of pneumonia (recurrent): Secondary | ICD-10-CM | POA: Insufficient documentation

## 2015-05-14 DIAGNOSIS — Z872 Personal history of diseases of the skin and subcutaneous tissue: Secondary | ICD-10-CM | POA: Insufficient documentation

## 2015-05-14 DIAGNOSIS — G8929 Other chronic pain: Secondary | ICD-10-CM | POA: Insufficient documentation

## 2015-05-14 DIAGNOSIS — I2510 Atherosclerotic heart disease of native coronary artery without angina pectoris: Secondary | ICD-10-CM | POA: Insufficient documentation

## 2015-05-14 DIAGNOSIS — Z7984 Long term (current) use of oral hypoglycemic drugs: Secondary | ICD-10-CM

## 2015-05-14 DIAGNOSIS — I6789 Other cerebrovascular disease: Secondary | ICD-10-CM | POA: Diagnosis not present

## 2015-05-14 DIAGNOSIS — M1990 Unspecified osteoarthritis, unspecified site: Secondary | ICD-10-CM

## 2015-05-14 DIAGNOSIS — M109 Gout, unspecified: Secondary | ICD-10-CM

## 2015-05-14 DIAGNOSIS — G8191 Hemiplegia, unspecified affecting right dominant side: Secondary | ICD-10-CM

## 2015-05-14 DIAGNOSIS — Z8719 Personal history of other diseases of the digestive system: Secondary | ICD-10-CM

## 2015-05-14 DIAGNOSIS — Z87891 Personal history of nicotine dependence: Secondary | ICD-10-CM | POA: Insufficient documentation

## 2015-05-14 DIAGNOSIS — Z8673 Personal history of transient ischemic attack (TIA), and cerebral infarction without residual deficits: Secondary | ICD-10-CM | POA: Diagnosis not present

## 2015-05-14 LAB — CBC
HCT: 35.4 % — ABNORMAL LOW (ref 39.0–52.0)
Hemoglobin: 11.2 g/dL — ABNORMAL LOW (ref 13.0–17.0)
MCH: 27 pg (ref 26.0–34.0)
MCHC: 31.6 g/dL (ref 30.0–36.0)
MCV: 85.3 fL (ref 78.0–100.0)
Platelets: 308 10*3/uL (ref 150–400)
RBC: 4.15 MIL/uL — ABNORMAL LOW (ref 4.22–5.81)
RDW: 14.9 % (ref 11.5–15.5)
WBC: 13.7 10*3/uL — ABNORMAL HIGH (ref 4.0–10.5)

## 2015-05-14 LAB — COMPREHENSIVE METABOLIC PANEL
ALT: 12 U/L — ABNORMAL LOW (ref 17–63)
AST: 12 U/L — ABNORMAL LOW (ref 15–41)
Albumin: 3.6 g/dL (ref 3.5–5.0)
Alkaline Phosphatase: 64 U/L (ref 38–126)
Anion gap: 7 (ref 5–15)
BUN: 28 mg/dL — ABNORMAL HIGH (ref 6–20)
CO2: 25 mmol/L (ref 22–32)
Calcium: 9.2 mg/dL (ref 8.9–10.3)
Chloride: 104 mmol/L (ref 101–111)
Creatinine, Ser: 1.34 mg/dL — ABNORMAL HIGH (ref 0.61–1.24)
GFR calc Af Amer: 59 mL/min — ABNORMAL LOW (ref >60)
GFR calc non Af Amer: 51 mL/min — ABNORMAL LOW (ref >60)
Glucose, Bld: 83 mg/dL (ref 65–99)
Potassium: 4.3 mmol/L (ref 3.5–5.1)
Sodium: 136 mmol/L (ref 135–145)
Total Bilirubin: 0.5 mg/dL (ref 0.3–1.2)
Total Protein: 7.2 g/dL (ref 6.5–8.1)

## 2015-05-14 LAB — DIFFERENTIAL
Basophils Absolute: 0.1 10*3/uL (ref 0.0–0.1)
Basophils Relative: 0 %
Eosinophils Absolute: 0.3 10*3/uL (ref 0.0–0.7)
Eosinophils Relative: 2 %
Lymphocytes Relative: 25 %
Lymphs Abs: 3.4 10*3/uL (ref 0.7–4.0)
Monocytes Absolute: 1 10*3/uL (ref 0.1–1.0)
Monocytes Relative: 7 %
Neutro Abs: 9 10*3/uL — ABNORMAL HIGH (ref 1.7–7.7)
Neutrophils Relative %: 66 %

## 2015-05-14 LAB — I-STAT CHEM 8, ED
BUN: 28 mg/dL — ABNORMAL HIGH (ref 6–20)
Calcium, Ion: 1.22 mmol/L (ref 1.13–1.30)
Chloride: 103 mmol/L (ref 101–111)
Creatinine, Ser: 1.3 mg/dL — ABNORMAL HIGH (ref 0.61–1.24)
Glucose, Bld: 82 mg/dL (ref 65–99)
HCT: 38 % — ABNORMAL LOW (ref 39.0–52.0)
Hemoglobin: 12.9 g/dL — ABNORMAL LOW (ref 13.0–17.0)
Potassium: 4.4 mmol/L (ref 3.5–5.1)
Sodium: 139 mmol/L (ref 135–145)
TCO2: 25 mmol/L (ref 0–100)

## 2015-05-14 LAB — PROTIME-INR
INR: 1.08 (ref 0.00–1.49)
Prothrombin Time: 14.2 seconds (ref 11.6–15.2)

## 2015-05-14 LAB — I-STAT TROPONIN, ED: Troponin i, poc: 0 ng/mL (ref 0.00–0.08)

## 2015-05-14 LAB — APTT: aPTT: 31 seconds (ref 24–37)

## 2015-05-14 LAB — CBG MONITORING, ED: Glucose-Capillary: 73 mg/dL (ref 65–99)

## 2015-05-14 MED ORDER — ASPIRIN 81 MG PO CHEW: 324.0000 mg | CHEWABLE_TABLET | Freq: Once | ORAL | Status: DC

## 2015-05-14 MED ORDER — ASPIRIN 300 MG RE SUPP
300.0000 mg | Freq: Once | RECTAL | Status: AC
  Administered 2015-05-14: 300 mg via RECTAL
  Filled 2015-05-14: qty 1

## 2015-05-14 NOTE — ED Provider Notes (Signed)
History  By signing my name below, I, Marlowe Kays, attest that this documentation has been prepared under the direction and in the presence of Virgel Manifold, MD. Electronically Signed: Marlowe Kays, ED Scribe. 05/14/2015. 9:42 AM  Chief Complaint  Patient presents with  . Weakness   The history is provided by the patient and medical records. No language interpreter was used.    HPI Comments:  [name] is a 74 y.o. male who presents to the Emergency Department complaining of right sided weakness that began yesterday. He reports worsening slurred speech. His wife reports worsening cough that causes SOB. Pt was just discharge from the hospital two days ago after having a stroke. Wife states his symptoms began worsening last night. He has not done anything for treatment of his symptoms. He denies modifying factors. He denies any pain. He denies visual changes, nausea or vomiting. Pt is left hand dominant. Pt's wife states she is in the process of setting up rehab services for the patient.  Past Medical History  Diagnosis Date  . GERD (gastroesophageal reflux disease)   . CAD (coronary artery disease)   . Obesity   . Hyperlipidemia   . Hypertension   . Chronic back pain   . Gout   . Chronic bronchitis ([place])     "get it q yr"  . Psoriasis   . COPD (chronic obstructive pulmonary disease) ([place])   . Myocardial infarction ([place]) 1999  . Pneumonia     "a few times; last time was ~ 06/2013"  . On home oxygen therapy     "2L; only at night" (08/30/2013)  . Diabetes mellitus, type 1   . Arthritis of knee   . Arthritis     "left leg" (08/30/2013)  . Vitamin B12 deficiency 05/01/2015  . Abnormality of gait 05/01/2015  . Tussive syncope 05/01/2015  . Stroke [facility])    Past Surgical History  Procedure Laterality Date  . Appendectomy    . Colonoscopy  03/17/2012    Procedure: COLONOSCOPY;  Surgeon: Rogene Houston, MD;  [facility];  Service: Endoscopy;  Laterality:  N/A;  830  . Cystoscopy with urethral dilatation N/A 11/26/2012    Procedure: CYSTOSCOPY WITH URETHRAL DILATATION;  Surgeon: Malka So, MD;  Location: [facility] ORS;  Service: Urology;  Laterality: N/A;  . Coronary angioplasty with stent placement  1999- 2009-08/30/2013    "counting today's, I have 5 stents" (08/30/2013)  . Left heart catheterization with coronary angiogram N/A 08/30/2013    Procedure: LEFT HEART CATHETERIZATION WITH CORONARY ANGIOGRAM;  Surgeon: Clent Demark, MD;  Location: [facility] CATH LAB;  Service: Cardiovascular;  Laterality: N/A;  . Percutaneous coronary stent intervention (pci-s) Right 08/30/2013    Procedure: PERCUTANEOUS CORONARY STENT INTERVENTION (PCI-S);  Surgeon: Clent Demark, MD;  Location: [facility] CATH LAB;  Service: Cardiovascular;  Laterality: Right;   Family History  Problem Relation Age of Onset  . Hypertension Mother   . Diabetes Mother   . Hypertension Father   . Diabetes Brother   . Arthritis      Family History   . Diabetes      family History   . Stroke Daughter    Social History  Substance Use Topics  . Smoking status: Former Smoker -- 0.50 packs/day for 33 years    Types: Cigarettes  . Smokeless tobacco: Never Used     Comment: "stopped smoking in the 1990's  . Alcohol Use: No    Review of Systems  Eyes: Negative for visual disturbance.  Respiratory: Positive for cough.   Gastrointestinal: Negative for nausea and vomiting.  Musculoskeletal: Negative for myalgias and arthralgias.  Neurological: Positive for weakness (right sided).  All other systems reviewed and are negative.   Allergies  Review of patient's allergies indicates no known allergies.  Home Medications   Prior to Admission medications   Medication Sig Start Date End Date Taking? Authorizing Provider  acetaminophen (TYLENOL) 500 MG tablet Take 500 mg by mouth every 6 (six) hours as needed for moderate pain (left thigh).   Yes Historical Provider, MD  albuterol (PROVENTIL  HFA;VENTOLIN HFA) 108 (90 BASE) MCG/ACT inhaler Inhale 1-2 puffs into the lungs every 6 (six) hours as needed for wheezing or shortness of breath. 11/13/14  Yes Nat Christen, MD  allopurinol (ZYLOPRIM) 300 MG tablet Take 300 mg by mouth daily. 05/02/13  Yes Historical Provider, MD  amLODipine (NORVASC) 10 MG tablet Take 1 tablet (10 mg total) by mouth daily. 09/20/14  Yes Fayrene Helper, MD  aspirin 325 MG tablet Take 1 tablet (325 mg total) by mouth daily. 05/12/15  Yes Kathie Dike, MD  insulin NPH-regular Human (NOVOLIN 70/30) (70-30) 100 UNIT/ML injection Inject 60 Units into the skin 2 (two) times daily with a meal. 04/20/15  Yes Estela Leonie Green, MD  levETIRAcetam (KEPPRA) 500 MG tablet Take 1 tablet (500 mg total) by mouth 2 (two) times daily. 05/12/15  Yes Kathie Dike, MD  levothyroxine (SYNTHROID, LEVOTHROID) 75 MCG tablet Take 75 mcg by mouth every morning.    Yes Historical Provider, MD  metFORMIN (GLUCOPHAGE) 500 MG tablet Take 1 tablet (500 mg total) by mouth 2 (two) times daily. 09/02/13  Yes Charolette Forward, MD  metoprolol (LOPRESSOR) 50 MG tablet Take 1 tablet (50 mg total) by mouth 2 (two) times daily. 12/25/14  Yes Fayrene Helper, MD  pravastatin (PRAVACHOL) 40 MG tablet Take 1 tablet (40 mg total) by mouth daily. 09/20/14  Yes Fayrene Helper, MD  spironolactone (ALDACTONE) 25 MG tablet Take 1 tablet (25 mg total) by mouth daily. 12/25/14  Yes Fayrene Helper, MD  vitamin B-12 (CYANOCOBALAMIN) 1000 MCG tablet Take 1,000 mcg by mouth daily.   Yes Historical Provider, MD  pantoprazole (PROTONIX) 40 MG tablet Take 1 tablet (40 mg total) by mouth daily before breakfast. Patient not taking: Reported on 05/14/2015 05/12/15   Kathie Dike, MD   Triage Vitals: BP 125/74 mmHg  Pulse 80  Temp(Src) 98 F (36.7 C) (Oral)  Resp 20  Ht 5' 10.5" (1.791 m)  Wt 234 lb (106.142 kg)  BMI 33.09 kg/m2  SpO2 95% Physical Exam  Constitutional: He is oriented to person, place, and  time. He appears well-developed and well-nourished.  HENT:  Head: Normocephalic.  Eyes: EOM are normal.  Neck: Normal range of motion.  Cardiovascular: Normal rate.   Pulmonary/Chest: Effort normal. No respiratory distress.  Abdominal: He exhibits no distension.  Musculoskeletal: Normal range of motion.  Neurological: He is alert and oriented to person, place, and time.  Dysarthria. Able to overcome gravity with RUE and RLE. No movement against resistance.  Psychiatric: He has a normal mood and affect.  Nursing note and vitals reviewed.   ED Course  Procedures (including critical care time) DIAGNOSTIC STUDIES: Oxygen Saturation is 95% on RA, adequate by my interpretation.   COORDINATION OF CARE: 8:34 AM- Spoke with patient about inpatient rehab placement. Will order head CT. Pt verbalizes understanding and agrees to plan.  Medications  aspirin chewable tablet 324 mg (not administered)    Labs Review Labs Reviewed  CBC - Abnormal; Notable for the following:    WBC 13.7 (*)    RBC 4.15 (*)    Hemoglobin 11.2 (*)    HCT 35.4 (*)    All other components within normal limits  DIFFERENTIAL - Abnormal; Notable for the following:    Neutro Abs 9.0 (*)    All other components within normal limits  COMPREHENSIVE METABOLIC PANEL - Abnormal; Notable for the following:    BUN 28 (*)    Creatinine, Ser 1.34 (*)    AST 12 (*)    ALT 12 (*)    GFR calc non Af Amer 51 (*)    GFR calc Af Amer 59 (*)    All other components within normal limits  I-STAT CHEM 8, ED - Abnormal; Notable for the following:    BUN 28 (*)    Creatinine, Ser 1.30 (*)    Hemoglobin 12.9 (*)    HCT 38.0 (*)    All other components within normal limits  PROTIME-INR  APTT  CBG MONITORING, ED  I-STAT TROPOININ, ED  CBG MONITORING, ED    Imaging Review Ct Head Wo Contrast  05/14/2015  CLINICAL DATA:  Recent history of a stroke, continue right-sided weakness. EXAM: CT HEAD WITHOUT CONTRAST TECHNIQUE:  Contiguous axial images were obtained from the base of the skull through the vertex without intravenous contrast. COMPARISON:  May 10, 2015 CT head, MR brain May 10, 2015 FINDINGS: The previous MR noted left pontine acute infarct is not as well appreciated on the CT scan. There is no hemorrhagic transformation. There is no midline shift, hydrocephalus. There is old lacune infarction of the left basal ganglia, unchanged. No new infarcts are identified. The bony calvarium is intact. The visualized sinuses are clear. IMPRESSION: Previous MRI dated left pontine acute infarct is not as well appreciated on CT scan. There is no hemorrhagic transformation. No new infarcts are identified. Electronically Signed   By: Abelardo Diesel M.D.   On: 05/14/2015 08:31   US Carotid Bilateral  05/12/2015  CLINICAL DATA:  Acute left pontine infarct. EXAM: BILATERAL CAROTID DUPLEX ULTRASOUND TECHNIQUE: Pearline Cables scale imaging, color Doppler and duplex ultrasound were performed of bilateral carotid and vertebral arteries in the neck. COMPARISON:  05/09/2001 FINDINGS: Criteria: Quantification of carotid stenosis is based on velocity parameters that correlate the residual internal carotid diameter with NASCET-based stenosis levels, using the diameter of the distal internal carotid lumen as the denominator for stenosis measurement. The following velocity measurements were obtained: RIGHT ICA:  103/27 cm/sec CCA:  [id number] cm/sec SYSTOLIC ICA/CCA RATIO:  123456 DIASTOLIC ICA/CCA RATIO:  123456 ECA:  204 cm/sec LEFT ICA:  98/25 cm/sec CCA:  [id number] cm/sec SYSTOLIC ICA/CCA RATIO:  99991111 DIASTOLIC ICA/CCA RATIO:  2.3 ECA:  107 cm/sec RIGHT CAROTID ARTERY: Minor echogenic shadowing plaque formation. No hemodynamically significant right ICA stenosis, velocity elevation, or turbulent flow. Degree of narrowing less than 50%. RIGHT VERTEBRAL ARTERY:  Antegrade LEFT CAROTID ARTERY: Similar scattered minor echogenic plaque formation. No hemodynamically  significant left ICA stenosis, velocity elevation, or turbulent flow. LEFT VERTEBRAL ARTERY:  Antegrade IMPRESSION: Minor carotid atherosclerosis. No hemodynamically significant ICA stenosis. Degree of narrowing less than 50% bilaterally. Electronically Signed   By: Jerilynn Mages.  Shick M.D.   On: 05/12/2015 11:24   I have personally reviewed and evaluated these images and lab results as part of my medical decision-making.   EKG Interpretation  Date/Time:  Monday May 14 2015 07:44:19 EST Ventricular Rate:  80 PR Interval:  49 QRS Duration: 75 QT Interval:  383 QTC Calculation: 442 R Axis:   -31 Text Interpretation:  Sinus rhythm Short PR interval Left axis deviation  Minimal ST elevation, lateral leads Baseline wander in lead(s) V5 ED  PHYSICIAN INTERPRETATION AVAILABLE IN [facility] Confirmed by TEST,  Record ([id number]) on 05/15/2015 7:10:04 AM      MDM   Final diagnoses:  Right hemiparesis ([place])  Cerebral infarction due to unspecified mechanism    74 year old male with worsening weakness since being discharged from the hospital recently with a stroke. Exam today seems consistent with the stated he was discharged from the hospital in. PT evaluated the patient in the emergency room today. Per the recommendations they do not feel that he needs a higher level of care. Additional home resources were ordered. Will be discharged back to home with continued PT.   9:32 AM  Discussed with CM. Qualified for home health prior to discharge. PT would need PT evaluation before could pursue placement.   Evaluated by PT. Additional home equipment ordered.   I personally preformed the services scribed in my presence. The recorded information has been reviewed is accurate. Virgel Manifold, MD.     Virgel Manifold, MD 05/20/15 [phone]

## 2015-05-14 NOTE — Evaluation (Signed)
Physical Therapy Evaluation Patient Details Name: [name] MRN: [id number] DOB: [birth year] Today's Date: 05/14/2015   History of Present Illness    Patient arrives from home via EMS with c/o weakness. Family states patient got up and walked to couch at home yesterday morning at 0600 and has remained there until this morning. Patient's family states that he was weak and did not want to come to the ED. Garbled speech that family reports has been intermittent since admission and d/c from hospital 12/24. Patient alert/oriented x 4. Denies pain  Clinical Impression  Pt recognizes therapist from IP stay. Pt mm test, mobility and gt has not changed from when pt was discharged.  Upon investigation pt sat in a low reclining chair when he got home.  Pt was advised to sit in a normal size chair with arms until he gets stronger.  Wife still appears reluctant but pt feels that he can manage at home if he has a rolling walker and a bed side commode ordered.     Follow Up Recommendations  [facility]    Equipment Recommendations    rolling walker as well as a bed side commode.   Recommendations for Other Services   speech and OT    Precautions / Restrictions   falls      Mobility  Bed Mobility  modified independent:  Takes pt longer but is able to complete.                Transfers  modified independent with a rolling walker.                   Ambulation/Gait    supervision assisted x 80 feet x 2 with rolling walker                       Pertinent Vitals/Pain  none verbalized.    Home Living  lives with wife                       Prior Function   ambulatory without assistive device.               Hand Dominance    Rt    Extremity/Trunk Assessment    Rt LE strength for hip mm is 2+/3-/5;  Knee mm is 3+/5                      Communication    normal  Cognition  normal                              Exercises   10 rep  ankle pumps, quad sets, heel slides      Assessment/Plan Pt is able to return to home with home health services but would qualify for SNF as well.  PT states that he feels he can go home but wife would prefer placement.  Will speak to Education officer, museum.   PT Assessment  Pt to be discharge and to receive skilled therapy PT, OT and speech for swallowing at discharge placement.l   PT Diagnosis     PT Problem List    PT Treatment Interventions     PT Goals (Current goals can be found in the Care Plan section)  discharge.    Frequency     Barriers to discharge  Wife vs. Pt  Wishes  Time:  11:45- 12:37     Charges:    EVAL      PT G CodesRayetta Humphrey, PT CLT [phone] 05/14/2015, 12:25 PM

## 2015-05-14 NOTE — ED Notes (Signed)
Patient arrives from home via EMS with c/o weakness. Family states patient got up and walked to couch at home yesterday morning at 0600 and has remained there until this morning. Patient's family states that he was weak and did not want to come to the ED. Garbled speech that family reports has been intermittent since admission and d/c from hospital 12/24. Patient alert/oriented x 4. Denies pain.

## 2015-05-14 NOTE — Progress Notes (Signed)
PT recommends [facility] PT as previously ordered. PT also recommended RW and BSC from [facility]. Cathy with [facility] will deliver DME to pt in ED prior to discharge. Pts nurse and ED MD aware. Once DME delivered pt can be discharge. [facility] is aware of the home health needs.

## 2015-05-14 NOTE — Progress Notes (Signed)
CM called by ED MD Rancour that pt needed placement. Pt was just discharged on 05/12/15 and had home health PT and OT with [facility] arranged. MD ED made aware of home health arrangement. ED MD stated that pt was now unable to lift leg and arm and felt that pt needed to be placed in facility as wife could not manage pt now. CM did request ED MD order PT consult and explained that PT would need to make recommendation for SNF. CSW is also aware of pt. Awaiting PT eval.

## 2015-05-14 NOTE — ED Notes (Signed)
Pt made aware to return if symptoms worsen or if any life threatening symptoms occur.   

## 2015-05-14 NOTE — ED Notes (Signed)
Pt speech has improved significantly. Pt speaking in complete sentences clearly.

## 2015-05-14 NOTE — ED Notes (Signed)
PT at bedside.

## 2015-05-15 ENCOUNTER — Emergency Department (HOSPITAL_COMMUNITY): Payer: Commercial Managed Care - HMO

## 2015-05-15 ENCOUNTER — Inpatient Hospital Stay (HOSPITAL_COMMUNITY): Payer: Commercial Managed Care - HMO

## 2015-05-15 ENCOUNTER — Other Ambulatory Visit: Payer: Self-pay | Admitting: *Deleted

## 2015-05-15 ENCOUNTER — Ambulatory Visit: Payer: Self-pay | Admitting: "Endocrinology

## 2015-05-15 ENCOUNTER — Encounter (HOSPITAL_COMMUNITY): Payer: Self-pay | Admitting: Emergency Medicine

## 2015-05-15 ENCOUNTER — Inpatient Hospital Stay (HOSPITAL_COMMUNITY)
Admission: EM | Admit: 2015-05-15 | Discharge: 2015-05-18 | DRG: 065 | Disposition: A | Discharge status: Skilled Nursing Facility | Payer: Commercial Managed Care - HMO | Attending: Family Medicine | Admitting: Family Medicine

## 2015-05-15 DIAGNOSIS — Z955 Presence of coronary angioplasty implant and graft: Secondary | ICD-10-CM

## 2015-05-15 DIAGNOSIS — Z7982 Long term (current) use of aspirin: Secondary | ICD-10-CM | POA: Diagnosis not present

## 2015-05-15 DIAGNOSIS — R278 Other lack of coordination: Secondary | ICD-10-CM | POA: Diagnosis not present

## 2015-05-15 DIAGNOSIS — K219 Gastro-esophageal reflux disease without esophagitis: Secondary | ICD-10-CM | POA: Diagnosis not present

## 2015-05-15 DIAGNOSIS — E1165 Type 2 diabetes mellitus with hyperglycemia: Secondary | ICD-10-CM | POA: Diagnosis not present

## 2015-05-15 DIAGNOSIS — E785 Hyperlipidemia, unspecified: Secondary | ICD-10-CM | POA: Diagnosis present

## 2015-05-15 DIAGNOSIS — J449 Chronic obstructive pulmonary disease, unspecified: Secondary | ICD-10-CM | POA: Diagnosis not present

## 2015-05-15 DIAGNOSIS — Z833 Family history of diabetes mellitus: Secondary | ICD-10-CM | POA: Diagnosis not present

## 2015-05-15 DIAGNOSIS — I2510 Atherosclerotic heart disease of native coronary artery without angina pectoris: Secondary | ICD-10-CM | POA: Diagnosis present

## 2015-05-15 DIAGNOSIS — M6289 Other specified disorders of muscle: Secondary | ICD-10-CM | POA: Diagnosis not present

## 2015-05-15 DIAGNOSIS — R4781 Slurred speech: Secondary | ICD-10-CM | POA: Diagnosis not present

## 2015-05-15 DIAGNOSIS — I6789 Other cerebrovascular disease: Secondary | ICD-10-CM | POA: Diagnosis not present

## 2015-05-15 DIAGNOSIS — I252 Old myocardial infarction: Secondary | ICD-10-CM | POA: Diagnosis not present

## 2015-05-15 DIAGNOSIS — G8191 Hemiplegia, unspecified affecting right dominant side: Secondary | ICD-10-CM | POA: Diagnosis not present

## 2015-05-15 DIAGNOSIS — F4489 Other dissociative and conversion disorders: Secondary | ICD-10-CM | POA: Diagnosis not present

## 2015-05-15 DIAGNOSIS — M1990 Unspecified osteoarthritis, unspecified site: Secondary | ICD-10-CM | POA: Diagnosis present

## 2015-05-15 DIAGNOSIS — E1065 Type 1 diabetes mellitus with hyperglycemia: Secondary | ICD-10-CM

## 2015-05-15 DIAGNOSIS — I639 Cerebral infarction, unspecified: Principal | ICD-10-CM | POA: Diagnosis present

## 2015-05-15 DIAGNOSIS — I69392 Facial weakness following cerebral infarction: Secondary | ICD-10-CM

## 2015-05-15 DIAGNOSIS — R531 Weakness: Secondary | ICD-10-CM | POA: Diagnosis not present

## 2015-05-15 DIAGNOSIS — R29810 Facial weakness: Secondary | ICD-10-CM | POA: Diagnosis present

## 2015-05-15 DIAGNOSIS — Z8249 Family history of ischemic heart disease and other diseases of the circulatory system: Secondary | ICD-10-CM

## 2015-05-15 DIAGNOSIS — E1059 Type 1 diabetes mellitus with other circulatory complications: Secondary | ICD-10-CM | POA: Diagnosis not present

## 2015-05-15 DIAGNOSIS — D72829 Elevated white blood cell count, unspecified: Secondary | ICD-10-CM | POA: Diagnosis present

## 2015-05-15 DIAGNOSIS — Z823 Family history of stroke: Secondary | ICD-10-CM | POA: Diagnosis not present

## 2015-05-15 DIAGNOSIS — I69351 Hemiplegia and hemiparesis following cerebral infarction affecting right dominant side: Secondary | ICD-10-CM | POA: Diagnosis not present

## 2015-05-15 DIAGNOSIS — E039 Hypothyroidism, unspecified: Secondary | ICD-10-CM | POA: Diagnosis present

## 2015-05-15 DIAGNOSIS — E538 Deficiency of other specified B group vitamins: Secondary | ICD-10-CM | POA: Diagnosis present

## 2015-05-15 DIAGNOSIS — I517 Cardiomegaly: Secondary | ICD-10-CM | POA: Diagnosis not present

## 2015-05-15 DIAGNOSIS — Z794 Long term (current) use of insulin: Secondary | ICD-10-CM

## 2015-05-15 DIAGNOSIS — K5900 Constipation, unspecified: Secondary | ICD-10-CM | POA: Diagnosis present

## 2015-05-15 DIAGNOSIS — R1310 Dysphagia, unspecified: Secondary | ICD-10-CM | POA: Diagnosis not present

## 2015-05-15 DIAGNOSIS — I10 Essential (primary) hypertension: Secondary | ICD-10-CM | POA: Diagnosis present

## 2015-05-15 DIAGNOSIS — R4702 Dysphasia: Secondary | ICD-10-CM | POA: Diagnosis present

## 2015-05-15 DIAGNOSIS — R569 Unspecified convulsions: Secondary | ICD-10-CM | POA: Diagnosis not present

## 2015-05-15 DIAGNOSIS — M109 Gout, unspecified: Secondary | ICD-10-CM | POA: Diagnosis not present

## 2015-05-15 DIAGNOSIS — G4089 Other seizures: Secondary | ICD-10-CM | POA: Diagnosis present

## 2015-05-15 DIAGNOSIS — R471 Dysarthria and anarthria: Secondary | ICD-10-CM | POA: Diagnosis not present

## 2015-05-15 DIAGNOSIS — E1142 Type 2 diabetes mellitus with diabetic polyneuropathy: Secondary | ICD-10-CM | POA: Diagnosis present

## 2015-05-15 DIAGNOSIS — Z9981 Dependence on supplemental oxygen: Secondary | ICD-10-CM | POA: Diagnosis not present

## 2015-05-15 DIAGNOSIS — D649 Anemia, unspecified: Secondary | ICD-10-CM | POA: Diagnosis present

## 2015-05-15 DIAGNOSIS — Z87891 Personal history of nicotine dependence: Secondary | ICD-10-CM

## 2015-05-15 DIAGNOSIS — I6523 Occlusion and stenosis of bilateral carotid arteries: Secondary | ICD-10-CM | POA: Diagnosis not present

## 2015-05-15 DIAGNOSIS — R1312 Dysphagia, oropharyngeal phase: Secondary | ICD-10-CM | POA: Diagnosis not present

## 2015-05-15 DIAGNOSIS — M6281 Muscle weakness (generalized): Secondary | ICD-10-CM | POA: Diagnosis not present

## 2015-05-15 DIAGNOSIS — R269 Unspecified abnormalities of gait and mobility: Secondary | ICD-10-CM | POA: Diagnosis not present

## 2015-05-15 DIAGNOSIS — F482 Pseudobulbar affect: Secondary | ICD-10-CM | POA: Diagnosis present

## 2015-05-15 DIAGNOSIS — R29818 Other symptoms and signs involving the nervous system: Secondary | ICD-10-CM | POA: Diagnosis not present

## 2015-05-15 DIAGNOSIS — R410 Disorientation, unspecified: Secondary | ICD-10-CM | POA: Diagnosis not present

## 2015-05-15 DIAGNOSIS — M1380 Other specified arthritis, unspecified site: Secondary | ICD-10-CM | POA: Diagnosis not present

## 2015-05-15 LAB — COMPREHENSIVE METABOLIC PANEL
ALT: 11 U/L — ABNORMAL LOW (ref 17–63)
ANION GAP: 8 (ref 5–15)
AST: 12 U/L — ABNORMAL LOW (ref 15–41)
Albumin: 3.7 g/dL (ref 3.5–5.0)
Alkaline Phosphatase: 67 U/L (ref 38–126)
BUN: 28 mg/dL — ABNORMAL HIGH (ref 6–20)
CHLORIDE: 104 mmol/L (ref 101–111)
CO2: 25 mmol/L (ref 22–32)
Calcium: 9.4 mg/dL (ref 8.9–10.3)
Creatinine, Ser: 1.23 mg/dL (ref 0.61–1.24)
GFR, EST NON AFRICAN AMERICAN: 56 mL/min — AB (ref >60)
Glucose, Bld: 97 mg/dL (ref 65–99)
Potassium: 4.5 mmol/L (ref 3.5–5.1)
SODIUM: 137 mmol/L (ref 135–145)
Total Bilirubin: 0.4 mg/dL (ref 0.3–1.2)
Total Protein: 7.6 g/dL (ref 6.5–8.1)

## 2015-05-15 LAB — CBC
HCT: 33.9 % — ABNORMAL LOW (ref 39.0–52.0)
Hemoglobin: 10.9 g/dL — ABNORMAL LOW (ref 13.0–17.0)
MCH: 27.6 pg (ref 26.0–34.0)
MCHC: 32.2 g/dL (ref 30.0–36.0)
MCV: 85.8 fL (ref 78.0–100.0)
PLATELETS: 346 10*3/uL (ref 150–400)
RBC: 3.95 MIL/uL — ABNORMAL LOW (ref 4.22–5.81)
RDW: 14.9 % (ref 11.5–15.5)
WBC: 13.3 10*3/uL — AB (ref 4.0–10.5)

## 2015-05-15 LAB — DIFFERENTIAL
BASOS PCT: 0 %
Basophils Absolute: 0 10*3/uL (ref 0.0–0.1)
EOS ABS: 0.1 10*3/uL (ref 0.0–0.7)
EOS PCT: 1 %
Lymphocytes Relative: 21 %
Lymphs Abs: 2.8 10*3/uL (ref 0.7–4.0)
MONO ABS: 0.6 10*3/uL (ref 0.1–1.0)
Monocytes Relative: 5 %
NEUTROS ABS: 9.6 10*3/uL — AB (ref 1.7–7.7)
Neutrophils Relative %: 73 %

## 2015-05-15 LAB — URINE MICROSCOPIC-ADD ON

## 2015-05-15 LAB — I-STAT CHEM 8, ED
BUN: 26 mg/dL — ABNORMAL HIGH (ref 6–20)
CALCIUM ION: 1.26 mmol/L (ref 1.13–1.30)
CHLORIDE: 102 mmol/L (ref 101–111)
Creatinine, Ser: 1.2 mg/dL (ref 0.61–1.24)
Glucose, Bld: 95 mg/dL (ref 65–99)
HCT: 37 % — ABNORMAL LOW (ref 39.0–52.0)
HEMOGLOBIN: 12.6 g/dL — AB (ref 13.0–17.0)
Potassium: 4.5 mmol/L (ref 3.5–5.1)
SODIUM: 139 mmol/L (ref 135–145)
TCO2: 27 mmol/L (ref 0–100)

## 2015-05-15 LAB — PROTIME-INR
INR: 1.12 (ref 0.00–1.49)
PROTHROMBIN TIME: 14.6 s (ref 11.6–15.2)

## 2015-05-15 LAB — URINALYSIS, ROUTINE W REFLEX MICROSCOPIC
BILIRUBIN URINE: NEGATIVE
Glucose, UA: NEGATIVE mg/dL
Ketones, ur: NEGATIVE mg/dL
Leukocytes, UA: NEGATIVE
NITRITE: NEGATIVE
PH: 5 (ref 5.0–8.0)
Protein, ur: NEGATIVE mg/dL
SPECIFIC GRAVITY, URINE: 1.01 (ref 1.005–1.030)

## 2015-05-15 LAB — I-STAT TROPONIN, ED: TROPONIN I, POC: 0 ng/mL (ref 0.00–0.08)

## 2015-05-15 LAB — GLUCOSE, CAPILLARY: GLUCOSE-CAPILLARY: 124 mg/dL — AB (ref 65–99)

## 2015-05-15 LAB — RAPID URINE DRUG SCREEN, HOSP PERFORMED
AMPHETAMINES: NOT DETECTED
BENZODIAZEPINES: NOT DETECTED
Barbiturates: NOT DETECTED
Cocaine: NOT DETECTED
OPIATES: NOT DETECTED
Tetrahydrocannabinol: NOT DETECTED

## 2015-05-15 LAB — ETHANOL

## 2015-05-15 LAB — APTT: aPTT: 31 seconds (ref 24–37)

## 2015-05-15 MED ORDER — IOHEXOL 350 MG/ML SOLN
100.0000 mL | Freq: Once | INTRAVENOUS | Status: AC | PRN
  Administered 2015-05-15: 75 mL via INTRAVENOUS

## 2015-05-15 MED ORDER — HEPARIN SODIUM (PORCINE) 5000 UNIT/ML IJ SOLN
5000.0000 [IU] | Freq: Three times a day (TID) | INTRAMUSCULAR | Status: DC
  Administered 2015-05-15 – 2015-05-18 (×8): 5000 [IU] via SUBCUTANEOUS
  Filled 2015-05-15 (×8): qty 1

## 2015-05-15 MED ORDER — LEVETIRACETAM IN NACL 500 MG/100ML IV SOLN
500.0000 mg | Freq: Two times a day (BID) | INTRAVENOUS | Status: DC
  Administered 2015-05-15 – 2015-05-17 (×4): 500 mg via INTRAVENOUS
  Filled 2015-05-15 (×6): qty 100

## 2015-05-15 MED ORDER — ASPIRIN 300 MG RE SUPP
300.0000 mg | Freq: Every day | RECTAL | Status: DC
  Administered 2015-05-15 – 2015-05-16 (×2): 300 mg via RECTAL
  Filled 2015-05-15 (×2): qty 1

## 2015-05-15 MED ORDER — LEVETIRACETAM IN NACL 500 MG/100ML IV SOLN
500.0000 mg | Freq: Once | INTRAVENOUS | Status: DC
  Filled 2015-05-15: qty 100

## 2015-05-15 MED ORDER — LEVOTHYROXINE SODIUM 100 MCG IV SOLR
37.0000 ug | Freq: Every day | INTRAVENOUS | Status: DC
  Administered 2015-05-16 – 2015-05-17 (×2): 37 ug via INTRAVENOUS
  Filled 2015-05-15 (×4): qty 5

## 2015-05-15 MED ORDER — LEVETIRACETAM IN NACL 500 MG/100ML IV SOLN
INTRAVENOUS | Status: AC
  Filled 2015-05-15: qty 100

## 2015-05-15 MED ORDER — ASPIRIN 325 MG PO TABS
325.0000 mg | ORAL_TABLET | Freq: Every day | ORAL | Status: DC
  Administered 2015-05-17 – 2015-05-18 (×2): 325 mg via ORAL
  Filled 2015-05-15 (×2): qty 1

## 2015-05-15 MED ORDER — HYDRALAZINE HCL 20 MG/ML IJ SOLN: 10.0000 mg | Freq: Four times a day (QID) | INTRAMUSCULAR | Status: DC | PRN

## 2015-05-15 MED ORDER — SODIUM CHLORIDE 0.9 % IV SOLN
500.0000 mg | Freq: Two times a day (BID) | INTRAVENOUS | Status: DC
  Filled 2015-05-15 (×2): qty 5

## 2015-05-15 MED ORDER — STROKE: EARLY STAGES OF RECOVERY BOOK
Freq: Once | Status: AC
  Administered 2015-05-16: 10:00:00
  Filled 2015-05-15: qty 1

## 2015-05-15 MED ORDER — PANTOPRAZOLE SODIUM 40 MG IV SOLR
40.0000 mg | INTRAVENOUS | Status: DC
  Administered 2015-05-15 – 2015-05-16 (×2): 40 mg via INTRAVENOUS
  Filled 2015-05-15 (×2): qty 40

## 2015-05-15 MED ORDER — INSULIN ASPART 100 UNIT/ML ~~LOC~~ SOLN: 0.0000 [IU] | Freq: Three times a day (TID) | SUBCUTANEOUS | Status: DC

## 2015-05-15 NOTE — ED Notes (Signed)
Pt returned from MRI/CT.

## 2015-05-15 NOTE — ED Notes (Signed)
MD notified of pts symptoms.

## 2015-05-15 NOTE — ED Notes (Signed)
Pt taken to MRI  

## 2015-05-15 NOTE — ED Notes (Signed)
Per EMS: Pt here yesterday for stroke like symptoms, pt was d/c'd home speaking in full sentences.  Pt has old stroke on right side.  Pt has inc aphasia this morning since 0430. Pt alert and oriented. 136/72, 88p, 98% on NRB

## 2015-05-15 NOTE — Patient Outreach (Signed)
Transition of Care week #1 RNCM started to call patient, noted patient was in ED with stroke symptoms. Plan to monitor and will follow up after discharge. Will attempt to call again tomorrow if home. Royetta Crochet. Laymond Purser, RN, BSN, [place] [phone]

## 2015-05-15 NOTE — Consult Note (Signed)
[place] A. John Laughter, MD     [email or web address]          [name] is an 74 y.o. male.   ASSESSMENT/PLAN:   Acute left pontine infarct - extension of previous left pontine infarct. Deficits include severe dysarthria, dysphasia, left Horner's syndrome, right hemiparesis/hemiplegia, left lower facial weakness and a left sixth nerve palsy. This is likely due to thrombosis of a perforating vessel off the basilar artery. Risk factors hypertension, age, dyslipidemia, and coronary artery disease.  Unexplained dysphagia over the last several months with difficulty swallowing liquids.  Cough and swallowing due to induced syncope.  Gait impairment.  Vitamin B12 deficiency.  Pseudobulbar affect.  Likely obstructive sleep apnea syndrome.  RECOMMENDATION: Dual antiplatelet agent for 3 months. Subsequently, the patient should be placed on a single agent. Plavix is probably preferable given his recurrent stroke on aspirin. Continue with other risk factor modification including evaluation and treatment of obstructive status syndrome, statin medication, and blood pressure control.    The patient the presented to the hospital with the mild left-sided hemiparesis. He was diagnosis as having a left pontine infarct likely due to perforating vessel off the basilar artery. Other workup was unrevealing. The patient was on aspirin low dose and the dose was increased to 325. Patient unfortunately presented with recurrent symptoms of dizziness, visual changes, dysarthria and dysphagia and gait impairment. He also presented with severe right-sided weakness. He does not report other symptoms such as chest pain, shortness of breath, neck pain or headaches. There is no GI or GU symptoms.  GENERAL: Pleasant in no acute distress.  HEENT: Supple. Atraumatic normocephalic.   ABDOMEN: soft  EXTREMITIES: No edema   BACK: Normal.  SKIN: Normal by inspection.    MENTAL STATUS:  Alert and oriented. The patient states his age and the month and appropriately. He has severe dysarthria. Language and cognition are generally intact. Judgment and insight normal.   CRANIAL NERVES: Pupils R 5 mm and reactive, left 3 mm - reactive both around; extra ocular movements are full on the R, however, there is plegia of the left lateral rectus with associated nystagmus on the right ductions bilaterally; visual fields are full; there is significant stenosis of the lower left facial muscle, there is also moderate ptosis on the left; tongue is midline; uvula is midline; shoulder elevation is normal.  MOTOR: Right upper extremity 2/5 and right lower extremity 1/5. Severe right upper extremity and right lower extremity. Left upper and lower extremities shows normal tone, bulk and strength. No drift on the left side.  COORDINATION: Left finger to nose is normal, right finger to nose is normal, No rest tremor; no intention tremor; no postural tremor; no bradykinesia.  REFLEXES: Deep tendon reflexes are symmetrical and normal.   SENSATION: Normal to light touch.   NIH Stroke scale 9.       PRIOR NEURO NOTE [[[[[[[[[[[[[[[[[[ASSESSMENT/PLAN: Acute left pontine infarct. This is likely due to thrombosis of a perforating vessel off the basilar artery. Risk factors hypertension, age, dyslipidemia, and coronary artery disease. Agree with increased dose of aspirin to 325. Unexplained dysphagia over the last several months with difficulty swallowing liquids. Cough and swallowing due to induced syncope. Gait impairment. Vitamin B12 deficiency. Pseudobulbar affect. Likely obstructive sleep apnea syndrome  RECOMMENDATION: Agree with increased dose of aspirin. Swallowing evaluation/ evaluation. Physical and occupational therapy. Prozac will be added to help with pseudobulbar affect and the rehabilitation post stroke. This is preferred over nuedexta for now as  the Prozac can help improve post  stroke rehabilitation. Replace vitamin B12. EEG. Acetylcholine receptor antibodies for workup of chronic dysphagia. Consider outpatient sleep study.  The patient presents with a two-day history of the right-sided weakness. This occurred at about 3 PM on Tuesday. Patient has been seen by Dr. Moshe Cipro for episodic confusion and falling. He actually had a MRI scheduled for later this month.The patient has had recurrent episodes of coughing and difficulty swallowing liquids for the last year. Symptoms are gotten worse significantly over the last 3 weeks. It appears that the difficult swallowing liquids causes the patient to cough and then a cough induced a brief episode of syncope. He is having recurrent spells usually one or more a day. He does not report having dysphagia with at this solid foods. He denies any dysarthria although he did have dysarthria stay when he developed right-sided numbness and weakness. He does have some shortness of breath which she thinks is coming from baseline COPD. The patient has had multiple episodes of crying spells occurring spontaneously without being sad or depressed. The review of systems otherwise negative.  BRAIN MRI/MRA IMPRESSION: 1. Acute lacunar infarct in the left paracentral pons, basilar artery perforator territory. No associated hemorrhage or mass effect. 2. Chronic small vessel ischemia in the deep gray matter nuclei, primarily the thalamostriate artery territory. 3. Intracranial artery dolichoectasia. No posterior circulation stenosis. Moderate stenosis of the left ICA siphon. No major circle of Willis branch occlusion]]]]]]]]]]]]]]]   Blood pressure 131/68, pulse 93, temperature 97.8 F (36.6 C), temperature source Oral, resp. rate 20, height '5\' 10"'$  (1.778 m), weight 104 kg (229 lb 4.5 oz), SpO2 90 %.  Past Medical History  Diagnosis Date  . GERD (gastroesophageal reflux disease)   . CAD (coronary artery disease)   . Obesity   . Hyperlipidemia    . Hypertension   . Chronic back pain   . Gout   . Chronic bronchitis ([place])     "get it q yr"  . Psoriasis   . COPD (chronic obstructive pulmonary disease) ([place])   . Myocardial infarction ([place]) 1999  . Pneumonia     "a few times; last time was ~ 06/2013"  . On home oxygen therapy     "2L; only at night" (08/30/2013)  . Diabetes mellitus, type 1   . Arthritis of knee   . Arthritis     "left leg" (08/30/2013)  . Vitamin B12 deficiency 05/01/2015  . Abnormality of gait 05/01/2015  . Tussive syncope 05/01/2015  . Stroke [facility])     Past Surgical History  Procedure Laterality Date  . Appendectomy    . Colonoscopy  03/17/2012    Procedure: COLONOSCOPY;  Surgeon: Rogene Houston, MD;  Location: [facility] ENDO SUITE;  Service: Endoscopy;  Laterality: N/A;  830  . Cystoscopy with urethral dilatation N/A 11/26/2012    Procedure: CYSTOSCOPY WITH URETHRAL DILATATION;  Surgeon: Malka So, MD;  Location: [facility] ORS;  Service: Urology;  Laterality: N/A;  . Coronary angioplasty with stent placement  1999- 2009-08/30/2013    "counting today's, I have 5 stents" (08/30/2013)  . Left heart catheterization with coronary angiogram N/A 08/30/2013    Procedure: LEFT HEART CATHETERIZATION WITH CORONARY ANGIOGRAM;  Surgeon: Clent Demark, MD;  Location: [facility] CATH LAB;  Service: Cardiovascular;  Laterality: N/A;  . Percutaneous coronary stent intervention (pci-s) Right 08/30/2013    Procedure: PERCUTANEOUS CORONARY STENT INTERVENTION (PCI-S);  Surgeon: Clent Demark, MD;  Location: [facility] CATH LAB;  Service: Cardiovascular;  Laterality: Right;    Family History  Problem Relation Age of Onset  . Hypertension Mother   . Diabetes Mother   . Hypertension Father   . Diabetes Brother   . Arthritis      Family History   . Diabetes      family History   . Stroke Daughter     Social History:  reports that he has quit smoking. His smoking use included Cigarettes. He has a 16.5 pack-year smoking history. He has never used  smokeless tobacco. He reports that he does not drink alcohol or use illicit drugs.  Allergies: No Known Allergies  Medications: Prior to Admission medications   Medication Sig Start Date End Date Taking? Authorizing Provider  acetaminophen (TYLENOL) 500 MG tablet Take 500 mg by mouth every 6 (six) hours as needed for moderate pain (left thigh).   Yes Historical Provider, MD  albuterol (PROVENTIL HFA;VENTOLIN HFA) 108 (90 BASE) MCG/ACT inhaler Inhale 1-2 puffs into the lungs every 6 (six) hours as needed for wheezing or shortness of breath. 11/13/14  Yes Nat Christen, MD  allopurinol (ZYLOPRIM) 300 MG tablet Take 300 mg by mouth daily. 05/02/13  Yes Historical Provider, MD  amLODipine (NORVASC) 10 MG tablet Take 1 tablet (10 mg total) by mouth daily. 09/20/14  Yes Fayrene Helper, MD  aspirin 325 MG tablet Take 1 tablet (325 mg total) by mouth daily. 05/12/15  Yes Kathie Dike, MD  insulin NPH-regular Human (NOVOLIN 70/30) (70-30) 100 UNIT/ML injection Inject 60 Units into the skin 2 (two) times daily with a meal. 04/20/15  Yes Estela Leonie Green, MD  levETIRAcetam (KEPPRA) 500 MG tablet Take 1 tablet (500 mg total) by mouth 2 (two) times daily. 05/12/15  Yes Kathie Dike, MD  levothyroxine (SYNTHROID, LEVOTHROID) 75 MCG tablet Take 75 mcg by mouth every morning.    Yes Historical Provider, MD  metFORMIN (GLUCOPHAGE) 500 MG tablet Take 1 tablet (500 mg total) by mouth 2 (two) times daily. 09/02/13  Yes Charolette Forward, MD  metoprolol (LOPRESSOR) 50 MG tablet Take 1 tablet (50 mg total) by mouth 2 (two) times daily. 12/25/14  Yes Fayrene Helper, MD  pravastatin (PRAVACHOL) 40 MG tablet Take 1 tablet (40 mg total) by mouth daily. 09/20/14  Yes Fayrene Helper, MD  spironolactone (ALDACTONE) 25 MG tablet Take 1 tablet (25 mg total) by mouth daily. 12/25/14  Yes Fayrene Helper, MD  vitamin B-12 (CYANOCOBALAMIN) 1000 MCG tablet Take 1,000 mcg by mouth daily.   Yes Historical Provider, MD    pantoprazole (PROTONIX) 40 MG tablet Take 1 tablet (40 mg total) by mouth daily before breakfast. 05/12/15   Kathie Dike, MD    Scheduled Meds: .  stroke: mapping our early stages of recovery book   Does not apply Once  . aspirin  300 mg Rectal Daily   Or  . aspirin  325 mg Oral Daily  . heparin  5,000 Units Subcutaneous 3 times per day  . [START ON 05/16/2015] insulin aspart  0-9 Units Subcutaneous TID WC  . levETIRAcetam  500 mg Intravenous Q12H  . levothyroxine  37 mcg Intravenous Daily  . pantoprazole (PROTONIX) IV  40 mg Intravenous Q24H   Continuous Infusions:  PRN Meds:.hydrALAZINE     Results for orders placed or performed during the hospital encounter of 05/15/15 (from the past 48 hour(s))  Ethanol     Status: None   Collection Time: 05/15/15 12:00 PM  Result Value Ref Range  Alcohol, Ethyl (B) <5 <5 mg/dL    Comment:        LOWEST DETECTABLE LIMIT FOR SERUM ALCOHOL IS 5 mg/dL FOR MEDICAL PURPOSES ONLY   Protime-INR     Status: None   Collection Time: 05/15/15 12:00 PM  Result Value Ref Range   Prothrombin Time 14.6 11.6 - 15.2 seconds   INR 1.12 0.00 - 1.49  APTT     Status: None   Collection Time: 05/15/15 12:00 PM  Result Value Ref Range   aPTT 31 24 - 37 seconds  CBC     Status: Abnormal   Collection Time: 05/15/15 12:00 PM  Result Value Ref Range   WBC 13.3 (H) 4.0 - 10.5 K/uL   RBC 3.95 (L) 4.22 - 5.81 MIL/uL   Hemoglobin 10.9 (L) 13.0 - 17.0 g/dL   HCT 33.9 (L) 39.0 - 52.0 %   MCV 85.8 78.0 - 100.0 fL   MCH 27.6 26.0 - 34.0 pg   MCHC 32.2 30.0 - 36.0 g/dL   RDW 14.9 11.5 - 15.5 %   Platelets 346 150 - 400 K/uL  Differential     Status: Abnormal   Collection Time: 05/15/15 12:00 PM  Result Value Ref Range   Neutrophils Relative % 73 %   Neutro Abs 9.6 (H) 1.7 - 7.7 K/uL   Lymphocytes Relative 21 %   Lymphs Abs 2.8 0.7 - 4.0 K/uL   Monocytes Relative 5 %   Monocytes Absolute 0.6 0.1 - 1.0 K/uL   Eosinophils Relative 1 %   Eosinophils  Absolute 0.1 0.0 - 0.7 K/uL   Basophils Relative 0 %   Basophils Absolute 0.0 0.0 - 0.1 K/uL  Comprehensive metabolic panel     Status: Abnormal   Collection Time: 05/15/15 12:00 PM  Result Value Ref Range   Sodium 137 135 - 145 mmol/L   Potassium 4.5 3.5 - 5.1 mmol/L   Chloride 104 101 - 111 mmol/L   CO2 25 22 - 32 mmol/L   Glucose, Bld 97 65 - 99 mg/dL   BUN 28 (H) 6 - 20 mg/dL   Creatinine, Ser 1.23 0.61 - 1.24 mg/dL   Calcium 9.4 8.9 - 10.3 mg/dL   Total Protein 7.6 6.5 - 8.1 g/dL   Albumin 3.7 3.5 - 5.0 g/dL   AST 12 (L) 15 - 41 U/L   ALT 11 (L) 17 - 63 U/L   Alkaline Phosphatase 67 38 - 126 U/L   Total Bilirubin 0.4 0.3 - 1.2 mg/dL   GFR calc non Af Amer 56 (L) >60 mL/min   GFR calc Af Amer >60 >60 mL/min    Comment: (NOTE) The eGFR has been calculated using the CKD EPI equation. This calculation has not been validated in all clinical situations. eGFR's persistently <60 mL/min signify possible Chronic Kidney Disease.    Anion gap 8 5 - 15  I-Stat Chem 8, ED  (not at [facility], [facility])     Status: Abnormal   Collection Time: 05/15/15 12:10 PM  Result Value Ref Range   Sodium 139 135 - 145 mmol/L   Potassium 4.5 3.5 - 5.1 mmol/L   Chloride 102 101 - 111 mmol/L   BUN 26 (H) 6 - 20 mg/dL   Creatinine, Ser 1.20 0.61 - 1.24 mg/dL   Glucose, Bld 95 65 - 99 mg/dL   Calcium, Ion 1.26 1.13 - 1.30 mmol/L   TCO2 27 0 - 100 mmol/L   Hemoglobin 12.6 (L) 13.0 - 17.0 g/dL  HCT 37.0 (L) 39.0 - 52.0 %  I-stat troponin, ED (not at [facility], [facility])     Status: None   Collection Time: 05/15/15 12:16 PM  Result Value Ref Range   Troponin i, poc 0.00 0.00 - 0.08 ng/mL   Comment 3            Comment: Due to the release kinetics of cTnI, a negative result within the first hours of the onset of symptoms does not rule out myocardial infarction with certainty. If myocardial infarction is still suspected, repeat the test at appropriate intervals.   Urine rapid drug screen (hosp performed)not at  [facility]     Status: None   Collection Time: 05/15/15  4:12 PM  Result Value Ref Range   Opiates NONE DETECTED NONE DETECTED   Cocaine NONE DETECTED NONE DETECTED   Benzodiazepines NONE DETECTED NONE DETECTED   Amphetamines NONE DETECTED NONE DETECTED   Tetrahydrocannabinol NONE DETECTED NONE DETECTED   Barbiturates NONE DETECTED NONE DETECTED    Comment:        DRUG SCREEN FOR MEDICAL PURPOSES ONLY.  IF CONFIRMATION IS NEEDED FOR ANY PURPOSE, NOTIFY LAB WITHIN 5 DAYS.        LOWEST DETECTABLE LIMITS FOR URINE DRUG SCREEN Drug Class       Cutoff (ng/mL) Amphetamine      1000 Barbiturate      200 Benzodiazepine   696 Tricyclics       789 Opiates          300 Cocaine          300 THC              50   Urinalysis, Routine w reflex microscopic (not at [facility])     Status: Abnormal   Collection Time: 05/15/15  4:12 PM  Result Value Ref Range   Color, Urine YELLOW YELLOW   APPearance CLEAR CLEAR   Specific Gravity, Urine 1.010 1.005 - 1.030   pH 5.0 5.0 - 8.0   Glucose, UA NEGATIVE NEGATIVE mg/dL   Hgb urine dipstick TRACE (A) NEGATIVE   Bilirubin Urine NEGATIVE NEGATIVE   Ketones, ur NEGATIVE NEGATIVE mg/dL   Protein, ur NEGATIVE NEGATIVE mg/dL   Nitrite NEGATIVE NEGATIVE   Leukocytes, UA NEGATIVE NEGATIVE  Urine microscopic-add on     Status: Abnormal   Collection Time: 05/15/15  4:12 PM  Result Value Ref Range   Squamous Epithelial / LPF 0-5 (A) NONE SEEN   WBC, UA 0-5 0 - 5 WBC/hpf   RBC / HPF 0-5 0 - 5 RBC/hpf   Bacteria, UA RARE (A) NONE SEEN   Urine-Other YEAST PRESENT     Studies/Results:  CTA HEAD AND NEAK Moderately advanced calcific plaque in the BILATERAL cavernous ICAs, estimated 50-75%. Otherwise no flow reducing anterior circulation disease.   Minor atheromatous change of the distal vertebral arteries and mid basilar artery, without flow reducing lesion or dissection to account for the observed acute LEFT pontine infarct.   Difficult to visualize LEFT  pontine infarct on CT. No visible hemorrhagic transformation. No abnormal postcontrast enhancement.    BRAIN MRI The patient's brain MRI is reviewed in person and unfortunately shows an extension of his previous left tegmental pontine infarct. The new infarct extends caudally and posteriorly as well as size of the initial infarct.       Rylin Saez A. [name], M.D.  Diplomate, Tax adviser of Psychiatry and Neurology ( Neurology). 05/15/2015, 6:34 PM

## 2015-05-15 NOTE — Progress Notes (Signed)
Beeper did not ever go off Call from ER stating [place] 1204 Pt in [place] at [place] Completed [place] [facility] at 1218

## 2015-05-15 NOTE — H&P (Signed)
[facility] History and Physical  [name] [id number] DOB: 1940/10/16 DOA: 05/15/2015  Referring physician: Dr. Melanee Left PCP: Tula Nakayama, MD   Chief Complaint: Facial droop and worsening right-sided weakness  HPI: [name] is a 74 y.o. male with past medical history of diabetes mellitus type 2, COPD and history of MI came into the hospital with a left facial droop and right-sided weakness. Patient admitted recently to the hospital from 12/22 till the 24th, he was admitted for similar symptoms which resolved while is in the hospital, that given the MRI showed pontine stroke but patient was able to communicate, walk and swallow without problems. He was discharged home on aspirin. On Monday patient has had worsening of right-sided weakness so he came in to the ED, and evaluated by CT scan, he was sent home to follow-up with neurology and outpatient PT. patient developed facial droop when he woke up from sleep this morning so his wife brought him to the hospital for further evaluation.  In the ED patient has complete right-sided flaccidity, facial droop and dysarthria, MRI of the brain was repeated and showed dorsal extension of the left paracentral pontine stroke. Patient admitted to the hospital for further evaluation.   Review of Systems:  Most of the history obtained from wife at bedside, because of dysarthria  Past Medical History  Diagnosis Date  . GERD (gastroesophageal reflux disease)   . CAD (coronary artery disease)   . Obesity   . Hyperlipidemia   . Hypertension   . Chronic back pain   . Gout   . Chronic bronchitis ([place])     "get it q yr"  . Psoriasis   . COPD (chronic obstructive pulmonary disease) ([place])   . Myocardial infarction ([place]) 1999  . Pneumonia     "a few times; last time was ~ 06/2013"  . On home oxygen therapy     "2L; only at night" (08/30/2013)  . Diabetes mellitus, type 1   . Arthritis of knee   . Arthritis     "left leg"  (08/30/2013)  . Vitamin B12 deficiency 05/01/2015  . Abnormality of gait 05/01/2015  . Tussive syncope 05/01/2015  . Stroke [facility])    Past Surgical History  Procedure Laterality Date  . Appendectomy    . Colonoscopy  03/17/2012    Procedure: COLONOSCOPY;  Surgeon: Rogene Houston, MD;  Location: [facility] ENDO SUITE;  Service: Endoscopy;  Laterality: N/A;  830  . Cystoscopy with urethral dilatation N/A 11/26/2012    Procedure: CYSTOSCOPY WITH URETHRAL DILATATION;  Surgeon: Malka So, MD;  Location: [facility] ORS;  Service: Urology;  Laterality: N/A;  . Coronary angioplasty with stent placement  1999- 2009-08/30/2013    "counting today's, I have 5 stents" (08/30/2013)  . Left heart catheterization with coronary angiogram N/A 08/30/2013    Procedure: LEFT HEART CATHETERIZATION WITH CORONARY ANGIOGRAM;  Surgeon: Clent Demark, MD;  Location: [facility] CATH LAB;  Service: Cardiovascular;  Laterality: N/A;  . Percutaneous coronary stent intervention (pci-s) Right 08/30/2013    Procedure: PERCUTANEOUS CORONARY STENT INTERVENTION (PCI-S);  Surgeon: Clent Demark, MD;  Location: [facility] CATH LAB;  Service: Cardiovascular;  Laterality: Right;   Social History:   reports that he has quit smoking. His smoking use included Cigarettes. He has a 16.5 pack-year smoking history. He has never used smokeless tobacco. He reports that he does not drink alcohol or use illicit drugs.  No Known Allergies  Family History  Problem Relation Age of Onset  .  Hypertension Mother   . Diabetes Mother   . Hypertension Father   . Diabetes Brother   . Arthritis      Family History   . Diabetes      family History   . Stroke Daughter    Prior to Admission medications   Medication Sig Start Date End Date Taking? Authorizing Provider  acetaminophen (TYLENOL) 500 MG tablet Take 500 mg by mouth every 6 (six) hours as needed for moderate pain (left thigh).   Yes Historical Provider, MD  albuterol (PROVENTIL HFA;VENTOLIN HFA) 108 (90 BASE)  MCG/ACT inhaler Inhale 1-2 puffs into the lungs every 6 (six) hours as needed for wheezing or shortness of breath. 11/13/14  Yes Nat Christen, MD  allopurinol (ZYLOPRIM) 300 MG tablet Take 300 mg by mouth daily. 05/02/13  Yes Historical Provider, MD  amLODipine (NORVASC) 10 MG tablet Take 1 tablet (10 mg total) by mouth daily. 09/20/14  Yes Fayrene Helper, MD  aspirin 325 MG tablet Take 1 tablet (325 mg total) by mouth daily. 05/12/15  Yes Kathie Dike, MD  insulin NPH-regular Human (NOVOLIN 70/30) (70-30) 100 UNIT/ML injection Inject 60 Units into the skin 2 (two) times daily with a meal. 04/20/15  Yes Estela Leonie Green, MD  levETIRAcetam (KEPPRA) 500 MG tablet Take 1 tablet (500 mg total) by mouth 2 (two) times daily. 05/12/15  Yes Kathie Dike, MD  levothyroxine (SYNTHROID, LEVOTHROID) 75 MCG tablet Take 75 mcg by mouth every morning.    Yes Historical Provider, MD  metFORMIN (GLUCOPHAGE) 500 MG tablet Take 1 tablet (500 mg total) by mouth 2 (two) times daily. 09/02/13  Yes Charolette Forward, MD  metoprolol (LOPRESSOR) 50 MG tablet Take 1 tablet (50 mg total) by mouth 2 (two) times daily. 12/25/14  Yes Fayrene Helper, MD  pravastatin (PRAVACHOL) 40 MG tablet Take 1 tablet (40 mg total) by mouth daily. 09/20/14  Yes Fayrene Helper, MD  spironolactone (ALDACTONE) 25 MG tablet Take 1 tablet (25 mg total) by mouth daily. 12/25/14  Yes Fayrene Helper, MD  vitamin B-12 (CYANOCOBALAMIN) 1000 MCG tablet Take 1,000 mcg by mouth daily.   Yes Historical Provider, MD  pantoprazole (PROTONIX) 40 MG tablet Take 1 tablet (40 mg total) by mouth daily before breakfast. 05/12/15   Kathie Dike, MD   Physical Exam: Filed Vitals:   05/15/15 1300 05/15/15 1315  BP: 113/76 131/66  Pulse: 93 88  Temp:    Resp: 23 22   Constitutional: Oriented to person, place, and time. Well-developed and well-nourished. Cooperative.  Head: Normocephalic and atraumatic.  Nose: Nose normal.  Mouth/Throat: Uvula is  midline, oropharynx is clear and moist and mucous membranes are normal.  Eyes: Conjunctivae and EOM are normal. Pupils are equal, round, and reactive to light.  Neck: Trachea normal and normal range of motion. Neck supple.  Cardiovascular: Normal rate, regular rhythm, S1 normal, S2 normal, normal heart sounds and intact distal pulses.   Pulmonary/Chest: Effort normal and breath sounds normal.  Abdominal: Soft. Bowel sounds are normal. There is no hepatosplenomegaly. There is no tenderness.  Musculoskeletal: Normal range of motion.  Neurological: Alert and oriented to person, place, and time. Has normal strength. No cranial nerve deficit or sensory deficit.  Skin: Skin is warm, dry and intact.  Psychiatric: Has a normal mood and affect. Speech is normal and behavior is normal.   Labs on Admission:  Basic Metabolic Panel:  Recent Labs Lab 05/09/15 2355 05/11/15 0530 05/12/15 0604 05/14/15 0802 05/14/15  [id number] 05/15/15 1200 05/15/15 1210  NA 136 137 137 139 136 137 139  K 5.3* 4.9 4.6 4.4 4.3 4.5 4.5  CL 103 106 106 103 104 104 102  CO2 27 25 26   --  25 25  --   GLUCOSE 113* 162* 108* 82 83 97 95  BUN 36* 34* 25* 28* 28* 28* 26*  CREATININE 1.60* 1.21 1.08 1.30* 1.34* 1.23 1.20  CALCIUM 9.2 8.7* 8.6*  --  9.2 9.4  --    Liver Function Tests:  Recent Labs Lab 05/09/15 2355 05/14/15 0822 05/15/15 1200  AST 14* 12* 12*  ALT 13* 12* 11*  ALKPHOS 72 64 67  BILITOT 0.5 0.5 0.4  PROT 7.6 7.2 7.6  ALBUMIN 3.7 3.6 3.7   No results for input(s): LIPASE, AMYLASE in the last 168 hours. No results for input(s): AMMONIA in the last 168 hours. CBC:  Recent Labs Lab 05/09/15 2355 05/11/15 0530 05/14/15 0802 05/14/15 0822 05/15/15 1200 05/15/15 1210  WBC 15.5* 14.8*  --  13.7* 13.3*  --   NEUTROABS 11.0*  --   --  9.0* 9.6*  --   HGB 10.4* 9.7* 12.9* 11.2* 10.9* 12.6*  HCT 33.0* 30.3* 38.0* 35.4* 33.9* 37.0*  MCV 85.9 84.9  --  85.3 85.8  --   PLT 297 300  --  308 346  --     Cardiac Enzymes:  Recent Labs Lab 05/09/15 2355  TROPONINI <0.03    BNP (last 3 results)  Recent Labs  04/18/15 2200  BNP 22.0    ProBNP (last 3 results) No results for input(s): PROBNP in the last 8760 hours.  CBG:  Recent Labs Lab 05/11/15 1604 05/11/15 2148 05/12/15 0757 05/12/15 1154 05/14/15 0746  GLUCAP 139* 182* 106* 106* 73    Radiological Exams on Admission: Ct Angio Head W/cm &/or Wo Cm  05/15/2015  CLINICAL DATA:  Worsening stroke symptoms with aphasia and right-sided weakness. Recent discharge for same. EXAM: CT ANGIOGRAPHY HEAD AND NECK TECHNIQUE: Multidetector CT imaging of the head and neck was performed using the standard protocol during bolus administration of intravenous contrast. Multiplanar CT image reconstructions and MIPs were obtained to evaluate the vascular anatomy. Carotid stenosis measurements (when applicable) are obtained utilizing NASCET criteria, using the distal internal carotid diameter as the denominator. CONTRAST:  56mL OMNIPAQUE IOHEXOL 350 MG/ML SOLN COMPARISON:  None. FINDINGS: CT HEAD Calvarium and skull base: No fracture or destructive lesion. Mastoids and middle ears are grossly clear. Paranasal sinuses: Imaged portions are clear. Orbits: Negative. Brain: LEFT paramedian pontine infarction is much easier to visualize on MR than CT. No evidence for hemorrhagic transformation of such. Mild atrophy consistent with patient's age. Hypoattenuation of white matter, representing small vessel disease. Chronic BILATERAL thalamic and deep white matter lacunar infarcts. CTA NECK Aortic arch: Common origin to the innominate and LEFT common carotid artery. Transverse arch calcification and mural thrombus. Moderately heavy calcific plaque at the origin LEFT subclavian but non stenotic. Imaged portion shows no evidence of aneurysm or dissection. No significant stenosis of the major arch vessel origins. Right carotid system: Only mild non stenotic  calcification at the RIGHT ICA origin. Extreme dolichoectasia with non stenotic cervical loop. Eccentric cervical ICA calcification at also non stenotic No evidence of dissection, stenosis (50% or greater) or occlusion. Left carotid system: Mildly chunky, but non stenotic, LEFT ICA calcific plaque. No measurable stenosis. Dolichoectatic cervical ICA with eccentric calcifications, also above stenotic. No evidence of dissection, stenosis (50% or greater)  or occlusion. Vertebral arteries: LEFT vertebral may be slightly larger. No evidence of dissection, or occlusion. RIGHT vertebral origin stenosis estimated 50%. Both vessels are continuous through the neck. Nonvascular soft tissues: No lung apex nodule. Mild apical pleural thickening is greater on the RIGHT. Advanced cervical spondylosis. No sinus or mastoid disease of significance. Negative orbits. CTA HEAD Anterior circulation: Moderately advanced calcific plaque in the BILATERAL cavernous ICAs estimated 50-75%. This is borderline flow reducing. No proximal occlusion, aneurysm, or vascular malformation. No significant A1, M1, or medium-sized MCA branch stenosis or occlusion. Posterior circulation: Mild BILATERAL eccentric calcification of the distal vertebral artery V4 segments without focal stenosis. Mild non stenotic irregularity of the basilar artery in its midportion with some small areas of eccentric calcific plaque. No significant stenosis, proximal occlusion, aneurysm, or vascular malformation. No significant PCA disease. Venous sinuses: As permitted by contrast timing, patent. Anatomic variants: None of significance. Delayed phase:   No abnormal intracranial enhancement. IMPRESSION: Moderately advanced calcific plaque in the BILATERAL cavernous ICAs, estimated 50-75%. Otherwise no flow reducing anterior circulation disease. Minor atheromatous change of the distal vertebral arteries and mid basilar artery, without flow reducing lesion or dissection to account  for the observed acute LEFT pontine infarct. Difficult to visualize LEFT pontine infarct on CT. No visible hemorrhagic transformation. No abnormal postcontrast enhancement. Electronically Signed   By: Staci Righter M.D.   On: 05/15/2015 15:00   Ct Head Wo Contrast  05/15/2015  CLINICAL DATA:  Code stroke. Stroke-like symptoms. Speaking in full sentences. EXAM: CT HEAD WITHOUT CONTRAST TECHNIQUE: Contiguous axial images were obtained from the base of the skull through the vertex without intravenous contrast. COMPARISON:  05/14/2015 FINDINGS: There is no evidence of mass effect, midline shift, or extra-axial fluid collections. There is no evidence of a space-occupying lesion or intracranial hemorrhage. There is no evidence of a cortical-based area of acute infarction. Recently demonstrated left pontine infarct on MRI dated 05/10/2015 is not well appreciated on the current exam. There is generalized cerebral atrophy. There is periventricular white matter low attenuation likely secondary to microangiopathy. The ventricles and sulci are appropriate for the patient's age. The basal cisterns are patent. Visualized portions of the orbits are unremarkable. The visualized portions of the paranasal sinuses and mastoid air cells are unremarkable. Cerebrovascular atherosclerotic calcifications are noted. The osseous structures are unremarkable. IMPRESSION: 1. No acute intracranial pathology. 2. Subacute left pontine infarct is not well demonstrated on the current examination. No hemorrhagic transformation. Electronically Signed   By: Kathreen Devoid   On: 05/15/2015 12:24   Ct Head Wo Contrast  05/14/2015  CLINICAL DATA:  Recent history of a stroke, continue right-sided weakness. EXAM: CT HEAD WITHOUT CONTRAST TECHNIQUE: Contiguous axial images were obtained from the base of the skull through the vertex without intravenous contrast. COMPARISON:  May 10, 2015 CT head, MR brain May 10, 2015 FINDINGS: The previous MR  noted left pontine acute infarct is not as well appreciated on the CT scan. There is no hemorrhagic transformation. There is no midline shift, hydrocephalus. There is old lacune infarction of the left basal ganglia, unchanged. No new infarcts are identified. The bony calvarium is intact. The visualized sinuses are clear. IMPRESSION: Previous MRI dated left pontine acute infarct is not as well appreciated on CT scan. There is no hemorrhagic transformation. No new infarcts are identified. Electronically Signed   By: Abelardo Diesel M.D.   On: 05/14/2015 08:31   Ct Angio Neck W/cm &/or Wo/cm  05/15/2015  CLINICAL DATA:  Worsening stroke symptoms with aphasia and right-sided weakness. Recent discharge for same. EXAM: CT ANGIOGRAPHY HEAD AND NECK TECHNIQUE: Multidetector CT imaging of the head and neck was performed using the standard protocol during bolus administration of intravenous contrast. Multiplanar CT image reconstructions and MIPs were obtained to evaluate the vascular anatomy. Carotid stenosis measurements (when applicable) are obtained utilizing NASCET criteria, using the distal internal carotid diameter as the denominator. CONTRAST:  4mL OMNIPAQUE IOHEXOL 350 MG/ML SOLN COMPARISON:  None. FINDINGS: CT HEAD Calvarium and skull base: No fracture or destructive lesion. Mastoids and middle ears are grossly clear. Paranasal sinuses: Imaged portions are clear. Orbits: Negative. Brain: LEFT paramedian pontine infarction is much easier to visualize on MR than CT. No evidence for hemorrhagic transformation of such. Mild atrophy consistent with patient's age. Hypoattenuation of white matter, representing small vessel disease. Chronic BILATERAL thalamic and deep white matter lacunar infarcts. CTA NECK Aortic arch: Common origin to the innominate and LEFT common carotid artery. Transverse arch calcification and mural thrombus. Moderately heavy calcific plaque at the origin LEFT subclavian but non stenotic. Imaged  portion shows no evidence of aneurysm or dissection. No significant stenosis of the major arch vessel origins. Right carotid system: Only mild non stenotic calcification at the RIGHT ICA origin. Extreme dolichoectasia with non stenotic cervical loop. Eccentric cervical ICA calcification at also non stenotic No evidence of dissection, stenosis (50% or greater) or occlusion. Left carotid system: Mildly chunky, but non stenotic, LEFT ICA calcific plaque. No measurable stenosis. Dolichoectatic cervical ICA with eccentric calcifications, also above stenotic. No evidence of dissection, stenosis (50% or greater) or occlusion. Vertebral arteries: LEFT vertebral may be slightly larger. No evidence of dissection, or occlusion. RIGHT vertebral origin stenosis estimated 50%. Both vessels are continuous through the neck. Nonvascular soft tissues: No lung apex nodule. Mild apical pleural thickening is greater on the RIGHT. Advanced cervical spondylosis. No sinus or mastoid disease of significance. Negative orbits. CTA HEAD Anterior circulation: Moderately advanced calcific plaque in the BILATERAL cavernous ICAs estimated 50-75%. This is borderline flow reducing. No proximal occlusion, aneurysm, or vascular malformation. No significant A1, M1, or medium-sized MCA branch stenosis or occlusion. Posterior circulation: Mild BILATERAL eccentric calcification of the distal vertebral artery V4 segments without focal stenosis. Mild non stenotic irregularity of the basilar artery in its midportion with some small areas of eccentric calcific plaque. No significant stenosis, proximal occlusion, aneurysm, or vascular malformation. No significant PCA disease. Venous sinuses: As permitted by contrast timing, patent. Anatomic variants: None of significance. Delayed phase:   No abnormal intracranial enhancement. IMPRESSION: Moderately advanced calcific plaque in the BILATERAL cavernous ICAs, estimated 50-75%. Otherwise no flow reducing anterior  circulation disease. Minor atheromatous change of the distal vertebral arteries and mid basilar artery, without flow reducing lesion or dissection to account for the observed acute LEFT pontine infarct. Difficult to visualize LEFT pontine infarct on CT. No visible hemorrhagic transformation. No abnormal postcontrast enhancement. Electronically Signed   By: Staci Righter M.D.   On: 05/15/2015 15:00   Mr Brain Wo Contrast  05/15/2015  CLINICAL DATA:  74 year old male with right side weakness, slurred speech. Recent Left brainstem infarct on the 22nd. Initial encounter. EXAM: MRI HEAD WITHOUT CONTRAST TECHNIQUE: Multiplanar, multiecho pulse sequences of the brain and surrounding structures were obtained without intravenous contrast. COMPARISON:  Head CT without contrast 1212 hours today and earlier. Brain MRI 05/10/2015. FINDINGS: The left paracentral brainstem infarct has extended dorsally since 05/10/2015, but otherwise affects the same vascular territory as before. See  series 1, image 10 and compared to series 100, image 11 previously). Associated T2 and FLAIR hyperintensity. No associated acute hemorrhage or mass effect. Major intracranial vascular flow voids are stable. No new area of restricted diffusion. Elsewhere stable gray and white matter signal elsewhere including multifocal chronic thalamic and posterior white matter capsule lacunar infarcts. Chronic micro hemorrhage in the right paracentral pons. No intracranial mass effect. No ventriculomegaly, extra-axial collection or acute intracranial hemorrhage. Cervicomedullary junction and pituitary are within normal limits. Negative visualized cervical spine. Normal bone marrow signal. Visualized internal auditory structures, mastoids, paranasal sinuses, orbits soft tissues and scalp soft tissues are stable. IMPRESSION: 1. Dorsal extension of the left paracentral pontine infarct since the 22nd. No associated hemorrhage or mass effect. 2. No new intracranial  abnormality. Electronically Signed   By: Genevie [name] M.D.   On: 05/15/2015 14:09   Dg Chest Portable 1 View  05/15/2015  CLINICAL DATA:  One episode of aphagia this morning. Sudden onset of rt sided weakness and slurred speech this morning. Hx of stroke 05/09/15. Hx of copd. EXAM: PORTABLE CHEST 1 VIEW COMPARISON:  04/23/2015 FINDINGS: Midline trachea. Cardiomegaly accentuated by AP portable technique. No pleural effusion or pneumothorax. No congestive failure. Subsegmental atelectasis at both lung bases. IMPRESSION: Mild cardiomegaly, without acute disease. Electronically Signed   By: Abigail Miyamoto M.D.   On: 05/15/2015 15:42    EKG: Independently reviewed.  Assessment/Plan Principal Problem:   CVA (cerebral infarction) Active Problems:   Diabetes mellitus, insulin dependent (IDDM), uncontrolled (HCC)   Hypothyroid   Partial seizure (HCC)   Facial droop   Dysarthria    CVA Presented with worsening of right-sided weakness, dysarthria and new left facial droop. Repeated MRI showed extension of the left paracentral pontine stroke. Patient is on 325 mg of aspirin. I will ask neurology to see him for further recommendation. CT angiography done no significant disease. I will not repeat the rest of the stroke workup which was done 3 days ago.  Dysarthria Patient has significant dysarthria likely he will have significant dysphagia as well. SLP to evaluate and treat.  History of partial seizures Started on Keppra in the hospital last time, converted to IV as he probably cannot swallow.  Diabetes mellitus type 2 Slightly uncontrolled diabetes type 2 with hemoglobin A1c of 7.1 (target is <7.0). Currently nothing by mouth, SSI.  Hypothyroidism Recently checked TSH is normal, hold oral Synthroid and give equivalent IV dose.    Dyslipidemia Total cholesterol is 147 and LDL is 78, when able to swallow can restart statin medications  Code Status: Full code Family Communication: plan  discussed with the patient in the presence of his wife and sister at bedside  Disposition Plan: telemetry.   Time spent: 70 minutes  Hovanes Hymas A, MD [facility] Pager [phone]

## 2015-05-15 NOTE — ED Provider Notes (Signed)
CSN: [id number]     Arrival date & time 05/15/15  1144 History  By signing my name below, I, Terressa Koyanagi, attest that this documentation has been prepared under the direction and in the presence of Verlee Monte, MD. Electronically Signed: Terressa Koyanagi, ED Scribe. 05/15/2015. 12:59 PM.  LEVEL 5: DIFFICULTY COMMUNICATING  Chief Complaint  Patient presents with  . Code Stroke   HPI PCP: Tula Nakayama, MD HPI Comments: [name] is a 74 y.o. male, with PMHx noted below including stroke on 05/09/15, who presents to the Emergency Department complaining of one episode of aphasia this morning. Associated Sx include acute right sided weakness and slurred speech onset this morning at about 430 Pt denies headache, abd pain, chest pain, or any other pain at this time. Pt was seen at the ED yesterday for right sided weakness, slurred speech, cough, and SOB resulting from cough. Wife reports pt was doing better when he was discharged from the ED yesterday. Wife further notes pt had no facial droop last night before he went to bed. She noticed he had L facial droop and difficulty speaking since this morning at about 430.  He was moving his right side some yesterday though it was week.  Now it is flaccid.  Past Medical History  Diagnosis Date  . GERD (gastroesophageal reflux disease)   . CAD (coronary artery disease)   . Obesity   . Hyperlipidemia   . Hypertension   . Chronic back pain   . Gout   . Chronic bronchitis ([place])     "get it q yr"  . Psoriasis   . COPD (chronic obstructive pulmonary disease) ([place])   . Myocardial infarction ([place]) 1999  . Pneumonia     "a few times; last time was ~ 06/2013"  . On home oxygen therapy     "2L; only at night" (08/30/2013)  . Diabetes mellitus, type 1   . Arthritis of knee   . Arthritis     "left leg" (08/30/2013)  . Vitamin B12 deficiency 05/01/2015  . Abnormality of gait 05/01/2015  . Tussive syncope 05/01/2015  . Stroke [facility])    Past Surgical  History  Procedure Laterality Date  . Appendectomy    . Colonoscopy  03/17/2012    Procedure: COLONOSCOPY;  Surgeon: Rogene Houston, MD;  Location: [facility] ENDO SUITE;  Service: Endoscopy;  Laterality: N/A;  830  . Cystoscopy with urethral dilatation N/A 11/26/2012    Procedure: CYSTOSCOPY WITH URETHRAL DILATATION;  Surgeon: Malka So, MD;  Location: [facility] ORS;  Service: Urology;  Laterality: N/A;  . Coronary angioplasty with stent placement  1999- 2009-08/30/2013    "counting today's, I have 5 stents" (08/30/2013)  . Left heart catheterization with coronary angiogram N/A 08/30/2013    Procedure: LEFT HEART CATHETERIZATION WITH CORONARY ANGIOGRAM;  Surgeon: Clent Demark, MD;  Location: [facility] CATH LAB;  Service: Cardiovascular;  Laterality: N/A;  . Percutaneous coronary stent intervention (pci-s) Right 08/30/2013    Procedure: PERCUTANEOUS CORONARY STENT INTERVENTION (PCI-S);  Surgeon: Clent Demark, MD;  Location: [facility] CATH LAB;  Service: Cardiovascular;  Laterality: Right;   Family History  Problem Relation Age of Onset  . Hypertension Mother   . Diabetes Mother   . Hypertension Father   . Diabetes Brother   . Arthritis      Family History   . Diabetes      family History   . Stroke Daughter    Social History  Substance Use Topics  .  Smoking status: Former Smoker -- 0.50 packs/day for 33 years    Types: Cigarettes  . Smokeless tobacco: Never Used     Comment: "stopped smoking in the 1990's  . Alcohol Use: No    Review of Systems  Unable to perform ROS: Other   Allergies  Review of patient's allergies indicates no known allergies.  Home Medications   Prior to Admission medications   Medication Sig Start Date End Date Taking? Authorizing Provider  acetaminophen (TYLENOL) 500 MG tablet Take 500 mg by mouth every 6 (six) hours as needed for moderate pain (left thigh).   Yes Historical Provider, MD  albuterol (PROVENTIL HFA;VENTOLIN HFA) 108 (90 BASE) MCG/ACT inhaler Inhale 1-2 puffs  into the lungs every 6 (six) hours as needed for wheezing or shortness of breath. 11/13/14  Yes Nat Christen, MD  allopurinol (ZYLOPRIM) 300 MG tablet Take 300 mg by mouth daily. 05/02/13  Yes Historical Provider, MD  amLODipine (NORVASC) 10 MG tablet Take 1 tablet (10 mg total) by mouth daily. 09/20/14  Yes Fayrene Helper, MD  aspirin 325 MG tablet Take 1 tablet (325 mg total) by mouth daily. 05/12/15  Yes Kathie Dike, MD  insulin NPH-regular Human (NOVOLIN 70/30) (70-30) 100 UNIT/ML injection Inject 60 Units into the skin 2 (two) times daily with a meal. 04/20/15  Yes Estela Leonie Green, MD  levETIRAcetam (KEPPRA) 500 MG tablet Take 1 tablet (500 mg total) by mouth 2 (two) times daily. 05/12/15  Yes Kathie Dike, MD  levothyroxine (SYNTHROID, LEVOTHROID) 75 MCG tablet Take 75 mcg by mouth every morning.    Yes Historical Provider, MD  metFORMIN (GLUCOPHAGE) 500 MG tablet Take 1 tablet (500 mg total) by mouth 2 (two) times daily. 09/02/13  Yes Charolette Forward, MD  metoprolol (LOPRESSOR) 50 MG tablet Take 1 tablet (50 mg total) by mouth 2 (two) times daily. 12/25/14  Yes Fayrene Helper, MD  pravastatin (PRAVACHOL) 40 MG tablet Take 1 tablet (40 mg total) by mouth daily. 09/20/14  Yes Fayrene Helper, MD  spironolactone (ALDACTONE) 25 MG tablet Take 1 tablet (25 mg total) by mouth daily. 12/25/14  Yes Fayrene Helper, MD  vitamin B-12 (CYANOCOBALAMIN) 1000 MCG tablet Take 1,000 mcg by mouth daily.   Yes Historical Provider, MD  pantoprazole (PROTONIX) 40 MG tablet Take 1 tablet (40 mg total) by mouth daily before breakfast. 05/12/15   Kathie Dike, MD   Triage Vitals: BP 128/70 mmHg  Pulse 94  Temp(Src) 98.2 F (36.8 C) (Oral)  Resp 20  Wt 234 lb (106.142 kg)  SpO2 94% Physical Exam  Constitutional: He appears well-developed and well-nourished. No distress.  HENT:  Head: Normocephalic and atraumatic.  Mouth/Throat: Oropharynx is clear and moist. No oropharyngeal exudate.  Eyes:  Conjunctivae are normal. Pupils are unequal.  Right pupil 4 mm and reactive  Left pupil 2 mm not reactive   Neck: Normal range of motion. Neck supple.  No meningismus.  Cardiovascular: Normal rate, regular rhythm, normal heart sounds and intact distal pulses.   No murmur heard. Pulmonary/Chest: Effort normal and breath sounds normal. No respiratory distress. He exhibits no tenderness.  Abdominal: Soft. There is no tenderness. There is no rebound and no guarding.  Musculoskeletal: Normal range of motion. He exhibits no edema or tenderness.  Neurological: He is alert. A cranial nerve deficit is present. He exhibits normal muscle tone. Coordination abnormal.  Dysarthria Left sided facial droop Right hemiparesis  Unable to lift R arm or leg against gravity.  Tongue midline 5/5 strength of L arm and leg.   Skin: Skin is warm.  Psychiatric: He has a normal mood and affect. His behavior is normal.  Nursing note and vitals reviewed.   ED Course  Procedures (including critical care time) DIAGNOSTIC STUDIES: Oxygen Saturation is 94% on ra, low by my interpretation.    COORDINATION OF CARE: 12:05 PM: Discussed treatment plan which includes labs, EKG, and imaging with pt at bedside; patient verbalizes understanding and agrees with treatment plan. 12:58 PM: Recheck discussion with family. Discussed MRI and likely hospital admission.   Labs Review Labs Reviewed  CBC - Abnormal; Notable for the following:    WBC 13.3 (*)    RBC 3.95 (*)    Hemoglobin 10.9 (*)    HCT 33.9 (*)    All other components within normal limits  DIFFERENTIAL - Abnormal; Notable for the following:    Neutro Abs 9.6 (*)    All other components within normal limits  COMPREHENSIVE METABOLIC PANEL - Abnormal; Notable for the following:    BUN 28 (*)    AST 12 (*)    ALT 11 (*)    GFR calc non Af Amer 56 (*)    All other components within normal limits  URINALYSIS, ROUTINE W REFLEX MICROSCOPIC (NOT AT [facility]) -  Abnormal; Notable for the following:    Hgb urine dipstick TRACE (*)    All other components within normal limits  URINE MICROSCOPIC-ADD ON - Abnormal; Notable for the following:    Squamous Epithelial / LPF 0-5 (*)    Bacteria, UA RARE (*)    All other components within normal limits  I-STAT CHEM 8, ED - Abnormal; Notable for the following:    BUN 26 (*)    Hemoglobin 12.6 (*)    HCT 37.0 (*)    All other components within normal limits  ETHANOL  PROTIME-INR  APTT  URINE RAPID DRUG SCREEN, HOSP PERFORMED  I-STAT TROPOININ, ED    Imaging Review Ct Angio Head W/cm &/or Wo Cm  05/15/2015  CLINICAL DATA:  Worsening stroke symptoms with aphasia and right-sided weakness. Recent discharge for same. EXAM: CT ANGIOGRAPHY HEAD AND NECK TECHNIQUE: Multidetector CT imaging of the head and neck was performed using the standard protocol during bolus administration of intravenous contrast. Multiplanar CT image reconstructions and MIPs were obtained to evaluate the vascular anatomy. Carotid stenosis measurements (when applicable) are obtained utilizing NASCET criteria, using the distal internal carotid diameter as the denominator. CONTRAST:  1mL OMNIPAQUE IOHEXOL 350 MG/ML SOLN COMPARISON:  None. FINDINGS: CT HEAD Calvarium and skull base: No fracture or destructive lesion. Mastoids and middle ears are grossly clear. Paranasal sinuses: Imaged portions are clear. Orbits: Negative. Brain: LEFT paramedian pontine infarction is much easier to visualize on MR than CT. No evidence for hemorrhagic transformation of such. Mild atrophy consistent with patient's age. Hypoattenuation of white matter, representing small vessel disease. Chronic BILATERAL thalamic and deep white matter lacunar infarcts. CTA NECK Aortic arch: Common origin to the innominate and LEFT common carotid artery. Transverse arch calcification and mural thrombus. Moderately heavy calcific plaque at the origin LEFT subclavian but non stenotic.  Imaged portion shows no evidence of aneurysm or dissection. No significant stenosis of the major arch vessel origins. Right carotid system: Only mild non stenotic calcification at the RIGHT ICA origin. Extreme dolichoectasia with non stenotic cervical loop. Eccentric cervical ICA calcification at also non stenotic No evidence of dissection, stenosis (50% or greater) or occlusion. Left carotid  system: Mildly chunky, but non stenotic, LEFT ICA calcific plaque. No measurable stenosis. Dolichoectatic cervical ICA with eccentric calcifications, also above stenotic. No evidence of dissection, stenosis (50% or greater) or occlusion. Vertebral arteries: LEFT vertebral may be slightly larger. No evidence of dissection, or occlusion. RIGHT vertebral origin stenosis estimated 50%. Both vessels are continuous through the neck. Nonvascular soft tissues: No lung apex nodule. Mild apical pleural thickening is greater on the RIGHT. Advanced cervical spondylosis. No sinus or mastoid disease of significance. Negative orbits. CTA HEAD Anterior circulation: Moderately advanced calcific plaque in the BILATERAL cavernous ICAs estimated 50-75%. This is borderline flow reducing. No proximal occlusion, aneurysm, or vascular malformation. No significant A1, M1, or medium-sized MCA branch stenosis or occlusion. Posterior circulation: Mild BILATERAL eccentric calcification of the distal vertebral artery V4 segments without focal stenosis. Mild non stenotic irregularity of the basilar artery in its midportion with some small areas of eccentric calcific plaque. No significant stenosis, proximal occlusion, aneurysm, or vascular malformation. No significant PCA disease. Venous sinuses: As permitted by contrast timing, patent. Anatomic variants: None of significance. Delayed phase:   No abnormal intracranial enhancement. IMPRESSION: Moderately advanced calcific plaque in the BILATERAL cavernous ICAs, estimated 50-75%. Otherwise no flow reducing  anterior circulation disease. Minor atheromatous change of the distal vertebral arteries and mid basilar artery, without flow reducing lesion or dissection to account for the observed acute LEFT pontine infarct. Difficult to visualize LEFT pontine infarct on CT. No visible hemorrhagic transformation. No abnormal postcontrast enhancement. Electronically Signed   By: Staci Righter M.D.   On: 05/15/2015 15:00   Ct Head Wo Contrast  05/15/2015  CLINICAL DATA:  Code stroke. Stroke-like symptoms. Speaking in full sentences. EXAM: CT HEAD WITHOUT CONTRAST TECHNIQUE: Contiguous axial images were obtained from the base of the skull through the vertex without intravenous contrast. COMPARISON:  05/14/2015 FINDINGS: There is no evidence of mass effect, midline shift, or extra-axial fluid collections. There is no evidence of a space-occupying lesion or intracranial hemorrhage. There is no evidence of a cortical-based area of acute infarction. Recently demonstrated left pontine infarct on MRI dated 05/10/2015 is not well appreciated on the current exam. There is generalized cerebral atrophy. There is periventricular white matter low attenuation likely secondary to microangiopathy. The ventricles and sulci are appropriate for the patient's age. The basal cisterns are patent. Visualized portions of the orbits are unremarkable. The visualized portions of the paranasal sinuses and mastoid air cells are unremarkable. Cerebrovascular atherosclerotic calcifications are noted. The osseous structures are unremarkable. IMPRESSION: 1. No acute intracranial pathology. 2. Subacute left pontine infarct is not well demonstrated on the current examination. No hemorrhagic transformation. Electronically Signed   By: Kathreen Devoid   On: 05/15/2015 12:24   Ct Head Wo Contrast  05/14/2015  CLINICAL DATA:  Recent history of a stroke, continue right-sided weakness. EXAM: CT HEAD WITHOUT CONTRAST TECHNIQUE: Contiguous axial images were obtained  from the base of the skull through the vertex without intravenous contrast. COMPARISON:  May 10, 2015 CT head, MR brain May 10, 2015 FINDINGS: The previous MR noted left pontine acute infarct is not as well appreciated on the CT scan. There is no hemorrhagic transformation. There is no midline shift, hydrocephalus. There is old lacune infarction of the left basal ganglia, unchanged. No new infarcts are identified. The bony calvarium is intact. The visualized sinuses are clear. IMPRESSION: Previous MRI dated left pontine acute infarct is not as well appreciated on CT scan. There is no hemorrhagic transformation. No new  infarcts are identified. Electronically Signed   By: Abelardo Diesel M.D.   On: 05/14/2015 08:31   Ct Angio Neck W/cm &/or Wo/cm  05/15/2015  CLINICAL DATA:  Worsening stroke symptoms with aphasia and right-sided weakness. Recent discharge for same. EXAM: CT ANGIOGRAPHY HEAD AND NECK TECHNIQUE: Multidetector CT imaging of the head and neck was performed using the standard protocol during bolus administration of intravenous contrast. Multiplanar CT image reconstructions and MIPs were obtained to evaluate the vascular anatomy. Carotid stenosis measurements (when applicable) are obtained utilizing NASCET criteria, using the distal internal carotid diameter as the denominator. CONTRAST:  37mL OMNIPAQUE IOHEXOL 350 MG/ML SOLN COMPARISON:  None. FINDINGS: CT HEAD Calvarium and skull base: No fracture or destructive lesion. Mastoids and middle ears are grossly clear. Paranasal sinuses: Imaged portions are clear. Orbits: Negative. Brain: LEFT paramedian pontine infarction is much easier to visualize on MR than CT. No evidence for hemorrhagic transformation of such. Mild atrophy consistent with patient's age. Hypoattenuation of white matter, representing small vessel disease. Chronic BILATERAL thalamic and deep white matter lacunar infarcts. CTA NECK Aortic arch: Common origin to the innominate and  LEFT common carotid artery. Transverse arch calcification and mural thrombus. Moderately heavy calcific plaque at the origin LEFT subclavian but non stenotic. Imaged portion shows no evidence of aneurysm or dissection. No significant stenosis of the major arch vessel origins. Right carotid system: Only mild non stenotic calcification at the RIGHT ICA origin. Extreme dolichoectasia with non stenotic cervical loop. Eccentric cervical ICA calcification at also non stenotic No evidence of dissection, stenosis (50% or greater) or occlusion. Left carotid system: Mildly chunky, but non stenotic, LEFT ICA calcific plaque. No measurable stenosis. Dolichoectatic cervical ICA with eccentric calcifications, also above stenotic. No evidence of dissection, stenosis (50% or greater) or occlusion. Vertebral arteries: LEFT vertebral may be slightly larger. No evidence of dissection, or occlusion. RIGHT vertebral origin stenosis estimated 50%. Both vessels are continuous through the neck. Nonvascular soft tissues: No lung apex nodule. Mild apical pleural thickening is greater on the RIGHT. Advanced cervical spondylosis. No sinus or mastoid disease of significance. Negative orbits. CTA HEAD Anterior circulation: Moderately advanced calcific plaque in the BILATERAL cavernous ICAs estimated 50-75%. This is borderline flow reducing. No proximal occlusion, aneurysm, or vascular malformation. No significant A1, M1, or medium-sized MCA branch stenosis or occlusion. Posterior circulation: Mild BILATERAL eccentric calcification of the distal vertebral artery V4 segments without focal stenosis. Mild non stenotic irregularity of the basilar artery in its midportion with some small areas of eccentric calcific plaque. No significant stenosis, proximal occlusion, aneurysm, or vascular malformation. No significant PCA disease. Venous sinuses: As permitted by contrast timing, patent. Anatomic variants: None of significance. Delayed phase:   No  abnormal intracranial enhancement. IMPRESSION: Moderately advanced calcific plaque in the BILATERAL cavernous ICAs, estimated 50-75%. Otherwise no flow reducing anterior circulation disease. Minor atheromatous change of the distal vertebral arteries and mid basilar artery, without flow reducing lesion or dissection to account for the observed acute LEFT pontine infarct. Difficult to visualize LEFT pontine infarct on CT. No visible hemorrhagic transformation. No abnormal postcontrast enhancement. Electronically Signed   By: Staci Righter M.D.   On: 05/15/2015 15:00   Mr Brain Wo Contrast  05/15/2015  CLINICAL DATA:  74 year old male with right side weakness, slurred speech. Recent Left brainstem infarct on the 22nd. Initial encounter. EXAM: MRI HEAD WITHOUT CONTRAST TECHNIQUE: Multiplanar, multiecho pulse sequences of the brain and surrounding structures were obtained without intravenous contrast. COMPARISON:  Head CT  without contrast 1212 hours today and earlier. Brain MRI 05/10/2015. FINDINGS: The left paracentral brainstem infarct has extended dorsally since 05/10/2015, but otherwise affects the same vascular territory as before. See series 1, image 10 and compared to series 100, image 11 previously). Associated T2 and FLAIR hyperintensity. No associated acute hemorrhage or mass effect. Major intracranial vascular flow voids are stable. No new area of restricted diffusion. Elsewhere stable gray and white matter signal elsewhere including multifocal chronic thalamic and posterior white matter capsule lacunar infarcts. Chronic micro hemorrhage in the right paracentral pons. No intracranial mass effect. No ventriculomegaly, extra-axial collection or acute intracranial hemorrhage. Cervicomedullary junction and pituitary are within normal limits. Negative visualized cervical spine. Normal bone marrow signal. Visualized internal auditory structures, mastoids, paranasal sinuses, orbits soft tissues and scalp soft  tissues are stable. IMPRESSION: 1. Dorsal extension of the left paracentral pontine infarct since the 22nd. No associated hemorrhage or mass effect. 2. No new intracranial abnormality. Electronically Signed   By: Genevie [name] M.D.   On: 05/15/2015 14:09   Dg Chest Portable 1 View  05/15/2015  CLINICAL DATA:  One episode of aphagia this morning. Sudden onset of rt sided weakness and slurred speech this morning. Hx of stroke 05/09/15. Hx of copd. EXAM: PORTABLE CHEST 1 VIEW COMPARISON:  04/23/2015 FINDINGS: Midline trachea. Cardiomegaly accentuated by AP portable technique. No pleural effusion or pneumothorax. No congestive failure. Subsegmental atelectasis at both lung bases. IMPRESSION: Mild cardiomegaly, without acute disease. Electronically Signed   By: Abigail Miyamoto M.D.   On: 05/15/2015 15:42   I have personally reviewed and evaluated these images and lab results as part of my medical decision-making.   EKG Interpretation   Date/Time:  Tuesday May 15 2015 11:56:50 EST Ventricular Rate:  93 PR Interval:  212 QRS Duration: 78 QT Interval:  360 QTC Calculation: 448 R Axis:   -38 Text Interpretation:  Sinus rhythm Borderline prolonged PR interval Left  axis deviation Low voltage, precordial leads No significant change was  found Confirmed by Wyvonnia Dusky  MD, Georgeann Brinkman [phone]) on 05/15/2015 12:34:39 PM      MDM   Final diagnoses:  Right sided weakness  Cerebral infarction due to unspecified mechanism   patient with recent CVA December 21 presenting from home with worsening right-sided weakness, left racial droop and slurred speech since about 4:00 this morning. He was seen yesterday for similar symptoms which apparently resolved. Code stroke activated but will not be tPA candidate due to recent stroke.  CT without new infarct or hemorrhage.  D/w neurology dr Geraldine Solar. He recommends MRI, CTA head and neck. MRA of head was performed during previous admission but neck was not. He is  concerned about worsening pontine infarct.  MRI shows extension of previous pontine infarct. CTA without significant occlusion.  Will admit for further workup by neurology.  Will likely also need rehab placement. D/w Dr. Hartford Poli.   I personally performed the services described in this documentation, which was scribed in my presence. The recorded information has been reviewed and is accurate.   [name], MD 05/15/15 [phone]

## 2015-05-16 ENCOUNTER — Ambulatory Visit (HOSPITAL_COMMUNITY): Admission: RE | Admit: 2015-05-16 | Payer: Commercial Managed Care - HMO | Source: Ambulatory Visit

## 2015-05-16 DIAGNOSIS — M6289 Other specified disorders of muscle: Secondary | ICD-10-CM

## 2015-05-16 DIAGNOSIS — R531 Weakness: Secondary | ICD-10-CM | POA: Insufficient documentation

## 2015-05-16 LAB — GLUCOSE, CAPILLARY
GLUCOSE-CAPILLARY: 86 mg/dL (ref 65–99)
GLUCOSE-CAPILLARY: 91 mg/dL (ref 65–99)
GLUCOSE-CAPILLARY: 127 mg/dL — AB (ref 65–99)

## 2015-05-16 LAB — MRSA PCR SCREENING: MRSA BY PCR: POSITIVE — AB

## 2015-05-16 MED ORDER — INSULIN ASPART 100 UNIT/ML ~~LOC~~ SOLN
0.0000 [IU] | Freq: Four times a day (QID) | SUBCUTANEOUS | Status: DC
Start: 2015-05-16 — End: 2015-05-18
  Administered 2015-05-16 – 2015-05-18 (×7): 1 [IU] via SUBCUTANEOUS

## 2015-05-16 MED ORDER — MUPIROCIN 2 % EX OINT
1.0000 "application " | TOPICAL_OINTMENT | Freq: Two times a day (BID) | CUTANEOUS | Status: DC
  Administered 2015-05-16 – 2015-05-18 (×5): 1 via NASAL
  Filled 2015-05-16 (×3): qty 22

## 2015-05-16 MED ORDER — STARCH (THICKENING) PO POWD
ORAL | Status: DC | PRN
  Filled 2015-05-16: qty 227

## 2015-05-16 MED ORDER — CHLORHEXIDINE GLUCONATE CLOTH 2 % EX PADS
6.0000 | MEDICATED_PAD | Freq: Every day | CUTANEOUS | Status: DC
  Administered 2015-05-16 – 2015-05-18 (×3): 6 via TOPICAL

## 2015-05-16 MED ORDER — KCL IN DEXTROSE-NACL 10-5-0.45 MEQ/L-%-% IV SOLN
INTRAVENOUS | Status: DC
  Administered 2015-05-16: 125 mL/h via INTRAVENOUS
  Administered 2015-05-16: 12:00:00 via INTRAVENOUS
  Administered 2015-05-17: 125 mL/h via INTRAVENOUS
  Filled 2015-05-16 (×7): qty 1000

## 2015-05-16 MED ORDER — RESOURCE THICKENUP CLEAR PO POWD
ORAL | Status: DC | PRN
  Filled 2015-05-16: qty 125

## 2015-05-16 NOTE — NC FL2 (Signed)
[place] LEVEL OF CARE SCREENING TOOL     IDENTIFICATION  Patient Name: [name] Birthdate: 05/15/1941 Sex: male Admission Date (Current Location): 05/15/2015  [facility] and [place] Number:  [employer] and Address:  [place] [address], [place]      Provider Number: [id number]  Attending Physician Name and Address:  Samuella Cota, MD  Relative Name and Phone Number:       Current Level of Care: Hospital Recommended Level of Care: [place] Prior Approval Number:    Date Approved/Denied:   PASRR Number:  ([id number])  Discharge Plan: SNF    Current Diagnoses: Patient Active Problem List   Diagnosis Date Noted  . Right sided weakness   . Facial droop 05/15/2015  . Dysarthria 05/15/2015  . Partial seizure ([place]) 05/12/2015  . Right hemiparesis ([place]) 05/12/2015  . Stroke ([place]) 05/10/2015  . GERD (gastroesophageal reflux disease) 05/10/2015  . CVA (cerebral infarction) 05/10/2015  . Hyperkalemia 05/10/2015  . Acute kidney injury (nontraumatic) ([place])   . Vitamin B12 deficiency 05/01/2015  . Abnormality of gait 05/01/2015  . Tussive syncope 05/01/2015  . Hospital discharge follow-up 04/29/2015  . Recurrent falls 04/29/2015  . Dysphagia 04/26/2015  . Near syncope 04/26/2015  . Subacute confusional state 04/26/2015  . COPD exacerbation ([place]) 04/19/2015  . Anemia 04/19/2015  . AKI (acute kidney injury) ([address]) 04/19/2015  . COPD with acute exacerbation ([place]) 02/11/2015  . Acute cystitis without hematuria 02/11/2015  . Carotid bruit 12/08/2013  . Angina pectoris ([place]) 08/30/2013  . Type 2 diabetes mellitus with diabetic nephropathy ([place]) 05/22/2013  . Onychomycosis of toenail 05/22/2013  . COPD (chronic obstructive pulmonary disease) ([place]) 11/22/2012  . Sleep related hypoxia 08/30/2012  . Vitamin D deficiency 12/23/2011  . Hypothyroid 12/23/2011  . DERMATOPHYTOSIS OF FOOT 07/22/2010   . PSORIASIS 07/18/2010  . HIP PAIN, BILATERAL 07/18/2010  . Gout 01/13/2010  . Diabetes mellitus, insulin dependent (IDDM), uncontrolled ([place]) 11/28/2008  . Hyperlipidemia with target LDL less than 70 10/05/2007  . Obesity (BMI 30.0-34.9) 10/05/2007  . Essential hypertension 10/05/2007  . Coronary atherosclerosis 10/05/2007  . GERD 10/05/2007  . Arthritis of knee, right 10/05/2007    Orientation RESPIRATION BLADDER Height & Weight    Self, Time, Situation  Normal Continent 5\' 10"  (177.8 cm) 228 lbs.  BEHAVIORAL SYMPTOMS/MOOD NEUROLOGICAL BOWEL NUTRITION STATUS      Continent Diet  AMBULATORY STATUS COMMUNICATION OF NEEDS Skin   Extensive Assist Verbally Normal                       Personal Care Assistance Level of Assistance  Bathing, Feeding, Dressing Bathing Assistance: Maximum assistance Feeding assistance: Maximum assistance Dressing Assistance: Maximum assistance     Functional Limitations Info             SPECIAL CARE FACTORS FREQUENCY  OT (By licensed OT)     PT Frequency: 7x/week OT Frequency: 2x/week            Contractures      Additional Factors Info  Insulin Sliding Scale       Insulin Sliding Scale Info:  (0-9 units every 6 hours)       Current Medications (05/16/2015):  This is the current hospital active medication list Current Facility-Administered Medications  Medication Dose Route Frequency Provider Last Rate Last Dose  . aspirin suppository 300 mg  300 mg Rectal Daily Verlee Monte, MD  300 mg at 05/16/15 [id number]   Or  . aspirin tablet 325 mg  325 mg Oral Daily Verlee Monte, MD      . Chlorhexidine Gluconate Cloth 2 % PADS 6 each  6 each Topical Q0600 Verlee Monte, MD   6 each at 05/16/15 0615  . dextrose 5 % and 0.45 % NaCl with KCl 10 mEq/L infusion   Intravenous Continuous Samuella Cota, MD 125 mL/hr at 05/16/15 1130    . heparin injection 5,000 Units  5,000 Units Subcutaneous 3 times per day Verlee Monte, MD   5,000  Units at 05/16/15 0615  . hydrALAZINE (APRESOLINE) injection 10 mg  10 mg Intravenous Q6H PRN Verlee Monte, MD      . insulin aspart (novoLOG) injection 0-9 Units  0-9 Units Subcutaneous Q6H Samuella Cota, MD      . levETIRAcetam (KEPPRA) IVPB 500 mg/100 mL premix  500 mg Intravenous Q12H Verlee Monte, MD   500 mg at 05/16/15 0923  . levothyroxine (SYNTHROID, LEVOTHROID) injection 37 mcg  37 mcg Intravenous Daily Verlee Monte, MD   37 mcg at 05/16/15 0936  . mupirocin ointment (BACTROBAN) 2 % 1 application  1 application Nasal BID Verlee Monte, MD   1 application at [id number] [phone]  . pantoprazole (PROTONIX) injection 40 mg  40 mg Intravenous Q24H Verlee Monte, MD   40 mg at 05/15/15 [id number]     Discharge Medications: Please see discharge summary for a list of discharge medications.  Relevant Imaging Results:  Relevant Lab Results:   Additional Information    Shakerria Parran, Clydene Pugh, LCSW

## 2015-05-16 NOTE — Progress Notes (Signed)
Patient ID: [name], male   DOB: 1940/12/05, 74 y.o.   MRN: [id number]    [place] A. Merlene Laughter, MD     [email or web address]          [name] is an 74 y.o. male.   Assessment/Plan:   Acute left pontine infarct - extension of previous left pontine infarct. Deficits include severe dysarthria, dysphasia, left Horner's syndrome, right hemiparesis/hemiplegia, left lower facial weakness and a left sixth nerve palsy. This is likely due to thrombosis of a perforating vessel off the basilar artery. Risk factors hypertension, age, dyslipidemia, and coronary artery disease.  Unexplained dysphagia over the last several months with difficulty swallowing liquids.  Cough and swallowing due to induced syncope.  Gait impairment.  Vitamin B12 deficiency.  Pseudobulbar affect.  Likely obstructive sleep apnea syndrome.  RECOMMENDATION: Dual antiplatelet agent for 3 months. Subsequently, the patient should be placed on a single agent. Plavix is probably preferable given his recurrent stroke on aspirin. Continue with other risk factor modification including evaluation and treatment of obstructive status syndrome, statin medication, and blood pressure control.    Doing about the same. Possible slight improvement LLE.   GENERAL: Pleasant in no acute distress.  HEENT: Supple. Atraumatic normocephalic.   ABDOMEN: soft  EXTREMITIES: No edema   BACK: Normal.  SKIN: Normal by inspection.    MENTAL STATUS: Alert and oriented. The patient states his age and the month and appropriately. He has severe dysarthria. Language and cognition are generally intact. Judgment and insight normal.   CRANIAL NERVES: Pupils R 5 mm and reactive, left 3 mm - reactive both around; extra ocular movements are full on the R, however, there is plegia of the left lateral rectus with associated nystagmus on the right ductions bilaterally; visual fields are full; there is significant  stenosis of the lower left facial muscle, there is also moderate ptosis on the left; tongue is midline; uvula is midline; shoulder elevation is normal.  MOTOR: Right upper extremity 2/5 and right lower extremity 2/5. Severe right upper extremity and right lower extremity. Left upper and lower extremities shows normal tone, bulk and strength. No drift on the left side.  COORDINATION: Left finger to nose is normal, right finger to nose is normal, No rest tremor; no intention tremor; no postural tremor; no bradykinesia.  REFLEXES: Deep tendon reflexes are symmetrical and normal.   SENSATION: Normal to light touch.   NIH Stroke scale 9.    Objective: Vital signs in last 24 hours: Temp:  [97.7 F (36.5 C)-98.4 F (36.9 C)] 98 F (36.7 C) (12/28 1200) Pulse Rate:  [53-94] 85 (12/28 2208) Resp:  [11-24] 16 (12/28 2208) BP: (104-132)/(43-85) 126/66 mmHg (12/28 1800) SpO2:  [92 %-98 %] 93 % (12/28 2208) Weight:  [103.7 kg (228 lb 9.9 oz)] 103.7 kg (228 lb 9.9 oz) (12/28 0500)  Intake/Output from previous day: 12/27 0701 - 12/28 0700 In: -  Out: 800 [Urine:800] Intake/Output this shift: Total I/O In: -  Out: 175 [Urine:175] Nutritional status: DIET - DYS 1 Room service appropriate?: Yes; Fluid consistency:: Honey Thick   Lab Results: Results for orders placed or performed during the hospital encounter of 05/15/15 (from the past 48 hour(s))  Ethanol     Status: None   Collection Time: 05/15/15 12:00 PM  Result Value Ref Range   Alcohol, Ethyl (B) <5 <5 mg/dL    Comment:        LOWEST DETECTABLE LIMIT FOR SERUM ALCOHOL IS 5 mg/dL  FOR MEDICAL PURPOSES ONLY   Protime-INR     Status: None   Collection Time: 05/15/15 12:00 PM  Result Value Ref Range   Prothrombin Time 14.6 11.6 - 15.2 seconds   INR 1.12 0.00 - 1.49  APTT     Status: None   Collection Time: 05/15/15 12:00 PM  Result Value Ref Range   aPTT 31 24 - 37 seconds  CBC     Status: Abnormal   Collection Time:  05/15/15 12:00 PM  Result Value Ref Range   WBC 13.3 (H) 4.0 - 10.5 K/uL   RBC 3.95 (L) 4.22 - 5.81 MIL/uL   Hemoglobin 10.9 (L) 13.0 - 17.0 g/dL   HCT 33.9 (L) 39.0 - 52.0 %   MCV 85.8 78.0 - 100.0 fL   MCH 27.6 26.0 - 34.0 pg   MCHC 32.2 30.0 - 36.0 g/dL   RDW 14.9 11.5 - 15.5 %   Platelets 346 150 - 400 K/uL  Differential     Status: Abnormal   Collection Time: 05/15/15 12:00 PM  Result Value Ref Range   Neutrophils Relative % 73 %   Neutro Abs 9.6 (H) 1.7 - 7.7 K/uL   Lymphocytes Relative 21 %   Lymphs Abs 2.8 0.7 - 4.0 K/uL   Monocytes Relative 5 %   Monocytes Absolute 0.6 0.1 - 1.0 K/uL   Eosinophils Relative 1 %   Eosinophils Absolute 0.1 0.0 - 0.7 K/uL   Basophils Relative 0 %   Basophils Absolute 0.0 0.0 - 0.1 K/uL  Comprehensive metabolic panel     Status: Abnormal   Collection Time: 05/15/15 12:00 PM  Result Value Ref Range   Sodium 137 135 - 145 mmol/L   Potassium 4.5 3.5 - 5.1 mmol/L   Chloride 104 101 - 111 mmol/L   CO2 25 22 - 32 mmol/L   Glucose, Bld 97 65 - 99 mg/dL   BUN 28 (H) 6 - 20 mg/dL   Creatinine, Ser 1.23 0.61 - 1.24 mg/dL   Calcium 9.4 8.9 - 10.3 mg/dL   Total Protein 7.6 6.5 - 8.1 g/dL   Albumin 3.7 3.5 - 5.0 g/dL   AST 12 (L) 15 - 41 U/L   ALT 11 (L) 17 - 63 U/L   Alkaline Phosphatase 67 38 - 126 U/L   Total Bilirubin 0.4 0.3 - 1.2 mg/dL   GFR calc non Af Amer 56 (L) >60 mL/min   GFR calc Af Amer >60 >60 mL/min    Comment: (NOTE) The eGFR has been calculated using the CKD EPI equation. This calculation has not been validated in all clinical situations. eGFR's persistently <60 mL/min signify possible Chronic Kidney Disease.    Anion gap 8 5 - 15  I-Stat Chem 8, ED  (not at [facility], [facility])     Status: Abnormal   Collection Time: 05/15/15 12:10 PM  Result Value Ref Range   Sodium 139 135 - 145 mmol/L   Potassium 4.5 3.5 - 5.1 mmol/L   Chloride 102 101 - 111 mmol/L   BUN 26 (H) 6 - 20 mg/dL   Creatinine, Ser 1.20 0.61 - 1.24 mg/dL    Glucose, Bld 95 65 - 99 mg/dL   Calcium, Ion 1.26 1.13 - 1.30 mmol/L   TCO2 27 0 - 100 mmol/L   Hemoglobin 12.6 (L) 13.0 - 17.0 g/dL   HCT 37.0 (L) 39.0 - 52.0 %  I-stat troponin, ED (not at [facility], [facility])     Status: None   Collection  Time: 05/15/15 12:16 PM  Result Value Ref Range   Troponin i, poc 0.00 0.00 - 0.08 ng/mL   Comment 3            Comment: Due to the release kinetics of cTnI, a negative result within the first hours of the onset of symptoms does not rule out myocardial infarction with certainty. If myocardial infarction is still suspected, repeat the test at appropriate intervals.   Urine rapid drug screen (hosp performed)not at [facility]     Status: None   Collection Time: 05/15/15  4:12 PM  Result Value Ref Range   Opiates NONE DETECTED NONE DETECTED   Cocaine NONE DETECTED NONE DETECTED   Benzodiazepines NONE DETECTED NONE DETECTED   Amphetamines NONE DETECTED NONE DETECTED   Tetrahydrocannabinol NONE DETECTED NONE DETECTED   Barbiturates NONE DETECTED NONE DETECTED    Comment:        DRUG SCREEN FOR MEDICAL PURPOSES ONLY.  IF CONFIRMATION IS NEEDED FOR ANY PURPOSE, NOTIFY LAB WITHIN 5 DAYS.        LOWEST DETECTABLE LIMITS FOR URINE DRUG SCREEN Drug Class       Cutoff (ng/mL) Amphetamine      1000 Barbiturate      200 Benzodiazepine   200 Tricyclics       300 Opiates          300 Cocaine          300 THC              50   Urinalysis, Routine w reflex microscopic (not at [facility])     Status: Abnormal   Collection Time: 05/15/15  4:12 PM  Result Value Ref Range   Color, Urine YELLOW YELLOW   APPearance CLEAR CLEAR   Specific Gravity, Urine 1.010 1.005 - 1.030   pH 5.0 5.0 - 8.0   Glucose, UA NEGATIVE NEGATIVE mg/dL   Hgb urine dipstick TRACE (A) NEGATIVE   Bilirubin Urine NEGATIVE NEGATIVE   Ketones, ur NEGATIVE NEGATIVE mg/dL   Protein, ur NEGATIVE NEGATIVE mg/dL   Nitrite NEGATIVE NEGATIVE   Leukocytes, UA NEGATIVE NEGATIVE  Urine microscopic-add on      Status: Abnormal   Collection Time: 05/15/15  4:12 PM  Result Value Ref Range   Squamous Epithelial / LPF 0-5 (A) NONE SEEN   WBC, UA 0-5 0 - 5 WBC/hpf   RBC / HPF 0-5 0 - 5 RBC/hpf   Bacteria, UA RARE (A) NONE SEEN   Urine-Other YEAST PRESENT   Glucose, capillary     Status: Abnormal   Collection Time: 05/15/15  9:21 PM  Result Value Ref Range   Glucose-Capillary 124 (H) 65 - 99 mg/dL   Comment 1 Notify RN   MRSA PCR Screening     Status: Abnormal   Collection Time: 05/15/15  9:30 PM  Result Value Ref Range   MRSA by PCR POSITIVE (A) NEGATIVE    Comment:        The GeneXpert MRSA Assay (FDA approved for NASAL specimens only), is one component of a comprehensive MRSA colonization surveillance program. It is not intended to diagnose MRSA infection nor to guide or monitor treatment for MRSA infections. RESULT CALLED TO, READ BACK BY AND VERIFIED WITH: MUNDY R AT [id number] ON [id number] BY FORSYTH K   Glucose, capillary     Status: None   Collection Time: 05/16/15  7:56 AM  Result Value Ref Range   Glucose-Capillary 86 65 - 99 mg/dL   Comment  1 Notify RN    Comment 2 Document in Chart   Glucose, capillary     Status: None   Collection Time: 05/16/15 11:19 AM  Result Value Ref Range   Glucose-Capillary 91 65 - 99 mg/dL   Comment 1 Notify RN    Comment 2 Document in Chart   Glucose, capillary     Status: Abnormal   Collection Time: 05/16/15  5:33 PM  Result Value Ref Range   Glucose-Capillary 127 (H) 65 - 99 mg/dL    Lipid Panel No results for input(s): CHOL, TRIG, HDL, CHOLHDL, VLDL, LDLCALC in the last 72 hours.  Studies/Results:   Medications:  Scheduled Meds: . aspirin  300 mg Rectal Daily   Or  . aspirin  325 mg Oral Daily  . Chlorhexidine Gluconate Cloth  6 each Topical Q0600  . heparin  5,000 Units Subcutaneous 3 times per day  . insulin aspart  0-9 Units Subcutaneous Q6H  . levETIRAcetam  500 mg Intravenous Q12H  . levothyroxine  37 mcg Intravenous Daily  .  mupirocin ointment  1 application Nasal BID  . pantoprazole (PROTONIX) IV  40 mg Intravenous Q24H   Continuous Infusions: . dextrose 5 % and 0.45 % NaCl with KCl 10 mEq/L 125 mL/hr (05/16/15 2050)   PRN Meds:.hydrALAZINE, [place]     LOS: 1 day   Hayze Gazda A. Merlene Laughter, M.D.  Diplomate, Tax adviser of Psychiatry and Neurology ( Neurology).

## 2015-05-16 NOTE — Clinical Social Work Placement (Signed)
   CLINICAL SOCIAL WORK PLACEMENT  NOTE  Date:  05/16/2015  Patient Details  Name: [name] MRN: [id number] Date of Birth: 06-Jul-1940  Clinical Social Work is seeking post-discharge placement for this patient at the [place] level of care (*CSW will initial, date and re-position this form in  chart as items are completed):  Yes   Patient/family provided with [place] Work Department's list of facilities offering this level of care within the geographic area requested by the patient (or if unable, by the patient's family).  Yes   Patient/family informed of their freedom to choose among providers that offer the needed level of care, that participate in Medicare, Medicaid or managed care program needed by the patient, have an available bed and are willing to accept the patient.  Yes   Patient/family informed of [place]'s ownership interest in [facility] and [facility], as well as of the fact that they are under no obligation to receive care at these facilities.  PASRR submitted to EDS on 05/16/15     PASRR number received on 05/16/15     Existing PASRR number confirmed on       FL2 transmitted to all facilities in geographic area requested by pt/family on 05/16/15     FL2 transmitted to all facilities within larger geographic area on       Patient informed that his/her managed care company has contracts with or will negotiate with certain facilities, including the following:            Patient/family informed of bed offers received.  Patient chooses bed at       Physician recommends and patient chooses bed at      Patient to be transferred to   on  .  Patient to be transferred to facility by       Patient family notified on   of transfer.  Name of family member notified:        PHYSICIAN       Additional Comment:    _______________________________________________ Ihor Gully, LCSW 05/16/2015, 2:21  PM [phone]

## 2015-05-16 NOTE — Consult Note (Signed)
   [facility] Inpatient Consult   05/16/2015  [name] [birth year] [id number]  Patient is currently active with [facility] for chronic disease management services.  Patient has been engaged by a [employer].  Our community based plan of care has focused on disease management and community resource support.  Patient will receive a post discharge transition of care call and will be evaluated for monthly home visits for assessments and disease process education.  Made Inpatient Case Manager aware that [facility] following. Of note, [facility] Care Management services does not replace or interfere with any services that are arranged by inpatient case management or social work.   For additional questions or referrals please contact:  Royetta Crochet. Laymond Purser, RN, BSN, [facility] Liaison [phone]

## 2015-05-16 NOTE — Clinical Social Work Note (Signed)
Clinical Social Work Assessment  Patient Details  Name: [name] MRN: [id number] Date of Birth: 1941-04-05  Date of referral:  05/16/15               Reason for consult:  Facility Placement                Permission sought to share information with:    Permission granted to share information::     Name::        Agency::     Relationship::     Contact Information:     Housing/Transportation Living arrangements for the past 2 months:  Single Family Home Source of Information:  Patient Patient Interpreter Needed:  None Criminal Activity/Legal Involvement Pertinent to Current Situation/Hospitalization:  No - Comment as needed Significant Relationships:  Spouse Lives with:  Spouse Do you feel safe going back to the place where you live?  Yes Need for family participation in patient care:  Yes (Comment)  Care giving concerns:  None identified.    Social Worker assessment / plan:  CSW met with patient who advised that he lives in the home with his wife. He stated that at baseline, he ambulates with a cane.  Patient stated that he is aware of the SNF recommendation made by PT. He stated that he wants to go to [facility], [facility] or [facility] for rehab.   Employment status:  Retired Nurse, adult PT Recommendations:  [place] / Referral to community resources:  [place]  Patient/Family's Response to care:  Patient is agreeable to go to SNF.   Patient/Family's Understanding of and Emotional Response to Diagnosis, Current Treatment, and Prognosis:  Patient verbalized understanding of his diagnosis, treatment and prognosis.   Emotional Assessment Appearance:  Appears stated age Attitude/Demeanor/Rapport:   (Cooperative) Affect (typically observed):  Calm Orientation:  Oriented to Self, Oriented to Place, Oriented to  Time, Oriented to Situation Alcohol / Substance use:  Not  Applicable Psych involvement (Current and /or in the community):  No (Comment)  Discharge Needs  Concerns to be addressed:  Discharge Planning Concerns Readmission within the last 30 days:  Yes Current discharge risk:  None Barriers to Discharge:  No Barriers Identified   Ihor Gully, LCSW 05/16/2015, 1:51 PM [phone]

## 2015-05-16 NOTE — Evaluation (Signed)
Speech Language Pathology Evaluation Patient Details Name: [name] MRN: [id number] DOB: 08/06/1940 Today's Date: 05/16/2015 Time: [id number] SLP Time Calculation (min) (ACUTE ONLY): 42 min  Problem List:  Patient Active Problem List   Diagnosis Date Noted  . Right sided weakness   . Facial droop 05/15/2015  . Dysarthria 05/15/2015  . Partial seizure ([place]) 05/12/2015  . Right hemiparesis ([place]) 05/12/2015  . Stroke ([place]) 05/10/2015  . GERD (gastroesophageal reflux disease) 05/10/2015  . CVA (cerebral infarction) 05/10/2015  . Hyperkalemia 05/10/2015  . Acute kidney injury (nontraumatic) ([place])   . Vitamin B12 deficiency 05/01/2015  . Abnormality of gait 05/01/2015  . Tussive syncope 05/01/2015  . Hospital discharge follow-up 04/29/2015  . Recurrent falls 04/29/2015  . Dysphagia 04/26/2015  . Near syncope 04/26/2015  . Subacute confusional state 04/26/2015  . COPD exacerbation ([place]) 04/19/2015  . Anemia 04/19/2015  . AKI (acute kidney injury) ([place]) 04/19/2015  . COPD with acute exacerbation ([place]) 02/11/2015  . Acute cystitis without hematuria 02/11/2015  . Carotid bruit 12/08/2013  . Angina pectoris ([place]) 08/30/2013  . Type 2 diabetes mellitus with diabetic nephropathy ([place]) 05/22/2013  . Onychomycosis of toenail 05/22/2013  . COPD (chronic obstructive pulmonary disease) ([place]) 11/22/2012  . Sleep related hypoxia 08/30/2012  . Vitamin D deficiency 12/23/2011  . Hypothyroid 12/23/2011  . DERMATOPHYTOSIS OF FOOT 07/22/2010  . PSORIASIS 07/18/2010  . HIP PAIN, BILATERAL 07/18/2010  . Gout 01/13/2010  . Diabetes mellitus, insulin dependent (IDDM), uncontrolled ([place]) 11/28/2008  . Hyperlipidemia with target LDL less than 70 10/05/2007  . Obesity (BMI 30.0-34.9) 10/05/2007  . Essential hypertension 10/05/2007  . Coronary atherosclerosis 10/05/2007  . GERD 10/05/2007  . Arthritis of knee, right 10/05/2007   Past Medical History:  Past Medical History  Diagnosis Date   . GERD (gastroesophageal reflux disease)   . CAD (coronary artery disease)   . Obesity   . Hyperlipidemia   . Hypertension   . Chronic back pain   . Gout   . Chronic bronchitis ([place])     "get it q yr"  . Psoriasis   . COPD (chronic obstructive pulmonary disease) ([place])   . Myocardial infarction ([place]) 1999  . Pneumonia     "a few times; last time was ~ 06/2013"  . On home oxygen therapy     "2L; only at night" (08/30/2013)  . Diabetes mellitus, type 1   . Arthritis of knee   . Arthritis     "left leg" (08/30/2013)  . Vitamin B12 deficiency 05/01/2015  . Abnormality of gait 05/01/2015  . Tussive syncope 05/01/2015  . Stroke [facility])    Past Surgical History:  Past Surgical History  Procedure Laterality Date  . Appendectomy    . Colonoscopy  03/17/2012    Procedure: COLONOSCOPY;  Surgeon: Rogene Houston, MD;  [facility];  Service: Endoscopy;  Laterality: N/A;  830  . Cystoscopy with urethral dilatation N/A 11/26/2012    Procedure: CYSTOSCOPY WITH URETHRAL DILATATION;  Surgeon: Malka So, MD;  Location: [facility] ORS;  Service: Urology;  Laterality: N/A;  . Coronary angioplasty with stent placement  1999- 2009-08/30/2013    "counting today's, I have 5 stents" (08/30/2013)  . Left heart catheterization with coronary angiogram N/A 08/30/2013    Procedure: LEFT HEART CATHETERIZATION WITH CORONARY ANGIOGRAM;  Surgeon: Clent Demark, MD;  Location: [facility] CATH LAB;  Service: Cardiovascular;  Laterality: N/A;  . Percutaneous coronary stent intervention (pci-s) Right 08/30/2013    Procedure: PERCUTANEOUS CORONARY  STENT INTERVENTION (PCI-S);  Surgeon: Clent Demark, MD;  Location: [facility] CATH LAB;  Service: Cardiovascular;  Laterality: Right;   HPI:  14 yom PMHx of diabetes mellitus type 2, COPD and history of MI came into the hospital with a left facial droop and right-sided weakness. Patient admitted recently to the hospital from 12/22 till the 24th, he was admitted for similar symptoms  which resolved while is in the hospital, that given the MRI showed pontine stroke but patient was able to communicate, walk and swallow without problems. He was discharged home on aspirin. On Monday patient has had worsening of right-sided weakness so he came in to the ED, and evaluated by CT scan, he was sent home to follow-up with neurology and outpatient PT. Patient developed facial droop when he woke up from sleep on 12/27 so his wife brought him to the hospital for further evaluation. In the ED patient had complete right-sided flaccidity, facial droop and dysarthria, MRI of the brain was repeated and showed dorsal extension of the left paracentral pontine stroke.   Assessment / Plan / Recommendation Clinical Impression  Mr. [name] is a 74 yo male who was admitted yesterday with symptoms consistent with stroke. MRI showed dorsal extension of the left paracentral pontine infarct since 05/10/2015. No associated hemorrhage or mass effect. BSE completed this date with recommendation for D1/puree with honey-thick liquids with use of compensatory strategies to reduce risk for aspiration and increase oral control due to suspected CN V and VII involvement. Pt known to this SLP from previous admissions and pt clearly with significant functional change. Pt presents with moderate flaccid dysarthria with overall speech intelligibility judged to be 65% intelligible at the phase level. Pt responds well to cues to over articulate and pause between each word to increase speech intelligibility. Suspect mild cognitive impairment with decreased awareness of some speech deficits with no attempts to repair communication breakdowns due to dysarthric speech. Mr. [name] is highly motivated to work on communication and swallowing skills and has excellent family support. He follows directions easily and demonstrates good recall of strategies when asked (it is harder for him to implement spontaneously in activities however).  He would benefit from continued skilled SLP therapy in an inpatient rehab setting to address speech and swallowing deficits, improve quality of life, provide education and support for wife in regards to stroke rehab, and maximize independence/decrease burden of care.    SLP Assessment  Patient needs continued Speech Lanaguage Pathology Services    Follow Up Recommendations  Inpatient Rehab    Frequency and Duration min 3x week  1 week      SLP Evaluation Prior Functioning  Cognitive/Linguistic Baseline: Within functional limits Type of Home: House  Lives With: Spouse Available Help at Discharge: Family   Cognition  Overall Cognitive Status: Impaired/Different from baseline Arousal/Alertness: Awake/alert Orientation Level: Oriented X4 Memory: Appears intact Awareness: Impaired Awareness Impairment: Anticipatory impairment Problem Solving: Appears intact Executive Function: Self Monitoring Self Monitoring: Impaired Self Monitoring Impairment: Functional complex Safety/Judgment: Appears intact Comments: Suspect mild impulsivity and failure to implement repair strategies for communication breakdowns in speech [employer] Scales of Cognitive Functioning: Purposeful/appropriate    Comprehension  Auditory Comprehension Overall Auditory Comprehension: Appears within functional limits for tasks assessed Yes/No Questions: Within Functional Limits Commands: Within Functional Limits Conversation: Complex Interfering Components:  (Pt currently c/o fatigue) Visual Recognition/Discrimination Discrimination:  (Pt reports decreased vision on left side) Reading Comprehension Reading Status: Not tested    Expression Expression Primary  Mode of Expression: Verbal Verbal Expression Overall Verbal Expression: Appears within functional limits for tasks assessed Initiation: No impairment Level of Generative/Spontaneous Verbalization: Conversation Repetition: No impairment Naming: No  impairment Pragmatics: No impairment Interfering Components: Speech intelligibility Non-Verbal Means of Communication: Not applicable Written Expression Dominant Hand: Left Written Expression: Not tested   Oral / Motor Oral Motor/Sensory Function Overall Oral Motor/Sensory Function: Moderate impairment Facial ROM: Reduced left;Suspected CN VII (facial) dysfunction Facial Symmetry: Abnormal symmetry left;Suspected CN VII (facial) dysfunction Facial Strength: Reduced left;Suspected CN VII (facial) dysfunction Facial Sensation: Reduced left;Suspected CN V (Trigeminal) dysfunction Lingual ROM: Within Functional Limits Lingual Symmetry: Within Functional Limits Lingual Strength: Within Functional Limits Lingual Sensation: Suspected CN VII (facial) dysfunction-anterior 2/3 tongue Velum:  (Could not visualize, no gag reflex elicited) Mandible: Suspected CN V (Trigeminal) dysfunction;Impaired Motor Speech Overall Motor Speech: Impaired Respiration: Impaired Level of Impairment: Phrase Phonation: Normal Resonance: Within functional limits Articulation: Impaired Level of Impairment: Phrase Intelligibility: Intelligibility reduced Word: 75-100% accurate Phrase: 50-74% accurate Sentence: 50-74% accurate Conversation: 50-74% accurate Motor Planning: Witnin functional limits Motor Speech Errors: Consistent Effective Techniques: Slow rate;Over-articulate;Pause   Thank you,  Genene Churn, [place]  [place] 05/16/2015, 6:08 PM

## 2015-05-16 NOTE — Plan of Care (Signed)
Problem: Education: Goal: Knowledge of [place] [facility] Education information/materials will improve Outcome: Progressing Discuss NPO status until seen by speech for swallowing evaluation.

## 2015-05-16 NOTE — Plan of Care (Signed)
Problem: Education: Goal: Knowledge of [place] [facility] Education information/materials will improve Outcome: Progressing Education booklet regarding stroke

## 2015-05-16 NOTE — Evaluation (Signed)
Clinical/Bedside Swallow Evaluation Patient Details  Name: [name] MRN: [id number] Date of Birth: Jul 07, 1940  Today's Date: 05/16/2015 Time: SLP Start Time (ACUTE ONLY): 1400 SLP Stop Time (ACUTE ONLY): 1433 SLP Time Calculation (min) (ACUTE ONLY): 33 min  Past Medical History:  Past Medical History  Diagnosis Date  . GERD (gastroesophageal reflux disease)   . CAD (coronary artery disease)   . Obesity   . Hyperlipidemia   . Hypertension   . Chronic back pain   . Gout   . Chronic bronchitis ([place])     "get it q yr"  . Psoriasis   . COPD (chronic obstructive pulmonary disease) ([place])   . Myocardial infarction ([place]) 1999  . Pneumonia     "a few times; last time was ~ 06/2013"  . On home oxygen therapy     "2L; only at night" (08/30/2013)  . Diabetes mellitus, type 1   . Arthritis of knee   . Arthritis     "left leg" (08/30/2013)  . Vitamin B12 deficiency 05/01/2015  . Abnormality of gait 05/01/2015  . Tussive syncope 05/01/2015  . Stroke [facility])    Past Surgical History:  Past Surgical History  Procedure Laterality Date  . Appendectomy    . Colonoscopy  03/17/2012    Procedure: COLONOSCOPY;  Surgeon: Rogene Houston, MD;  [facility];  Service: Endoscopy;  Laterality: N/A;  830  . Cystoscopy with urethral dilatation N/A 11/26/2012    Procedure: CYSTOSCOPY WITH URETHRAL DILATATION;  Surgeon: Malka So, MD;  Location: [facility] ORS;  Service: Urology;  Laterality: N/A;  . Coronary angioplasty with stent placement  1999- 2009-08/30/2013    "counting today's, I have 5 stents" (08/30/2013)  . Left heart catheterization with coronary angiogram N/A 08/30/2013    Procedure: LEFT HEART CATHETERIZATION WITH CORONARY ANGIOGRAM;  Surgeon: Clent Demark, MD;  Location: [facility] CATH LAB;  Service: Cardiovascular;  Laterality: N/A;  . Percutaneous coronary stent intervention (pci-s) Right 08/30/2013    Procedure: PERCUTANEOUS CORONARY STENT INTERVENTION (PCI-S);  Surgeon: Clent Demark, MD;  Location: [facility] CATH LAB;  Service: Cardiovascular;  Laterality: Right;   HPI:  35 yom PMHx of diabetes mellitus type 2, COPD and history of MI came into the hospital with a left facial droop and right-sided weakness. Patient admitted recently to the hospital from 12/22 till the 24th, he was admitted for similar symptoms which resolved while is in the hospital, that given the MRI showed pontine stroke but patient was able to communicate, walk and swallow without problems. He was discharged home on aspirin. On Monday patient has had worsening of right-sided weakness so he came in to the ED, and evaluated by CT scan, he was sent home to follow-up with neurology and outpatient PT. Patient developed facial droop when he woke up from sleep on 12/27 so his wife brought him to the hospital for further evaluation. In the ED patient had complete right-sided flaccidity, facial droop and dysarthria, MRI of the brain was repeated and showed dorsal extension of the left paracentral pontine stroke.   Assessment / Plan / Recommendation Clinical Impression  Unfortunately, [name] is back in the hospital with an extension of his left pontine stroke. His wife was in room for evaluation. Pt remebers SLP from previous admission and reminded SLP that he was told to "swallow two times" by SLP. Speech is dysarthric and pt unable to close left eye (likely due to CN VI involvement). Pt with left facial weakness  and right upper and lower extremity weakness. Oral motor examination also reveals significant difficulty with labial closure. Pt presented with ice chips, thin water, honey-thick liquids, and puree. Pt with labial spillage (pt aware) with thins and mild with honey-thick due to left sided weakness. Pharyngeal swallow seemingly timely, however likely negatively impacted by severity of oral phase deficits. Pt demonstrates mild impulsivity and needed reminders to take one sip at a time (SLP provided tactile  guidance of left hand for self feeding). Pt with one episode of coughing which was not related to po intake as it occurred when SLP raised HOB. Prior to this admission, pt and wife reported frequent coughing during meals which then lead to pt falling backward and shaking (per wife report). Pt was started on Keppra; unknown if pt has had EEG. Recommend D1/puree with HTL with 1:1 feeder assist/supervision for strategies: Go slowly, small bites/sips, swallow 2x for each bite/sip, discontinue po if coughing, crush meds as able in puree, sit fully upright for all eating/drinking. NO thin liquids at this time/No thin water. SLP will follow for dysphagia and SLE for dysarthria. Pt would be an excellent candidate for inpatient rehab program due to high motivation, good family support, good memory skills and follow through of strategies, and acute nature of this stroke with dramatic change in function.    Aspiration Risk  Moderate aspiration risk    Diet Recommendation Dysphagia 1 (Puree);Honey-thick liquid   Liquid Administration via: Cup;No straw Medication Administration: Crushed with puree Supervision: Patient able to self feed;Full supervision/cueing for compensatory strategies Compensations: Minimize environmental distractions;Slow rate;Small sips/bites;Lingual sweep for clearance of pocketing;Multiple dry swallows after each bite/sip Postural Changes: Seated upright at 90 degrees;Remain upright for at least 30 minutes after po intake    Other  Recommendations Oral Care Recommendations: Oral care BID;Staff/trained caregiver to provide oral care Other Recommendations: Clarify dietary restrictions;Order thickener from pharmacy   Follow up Recommendations  Inpatient Rehab    Frequency and Duration min 3x week  1 week       Prognosis Prognosis for Safe Diet Advancement: Good      Swallow Study   General Date of Onset: 05/15/15 HPI: 22 yom PMHx of diabetes mellitus type 2, COPD and history of MI  came into the hospital with a left facial droop and right-sided weakness. Patient admitted recently to the hospital from 12/22 till the 24th, he was admitted for similar symptoms which resolved while is in the hospital, that given the MRI showed pontine stroke but patient was able to communicate, walk and swallow without problems. He was discharged home on aspirin. On Monday patient has had worsening of right-sided weakness so he came in to the ED, and evaluated by CT scan, he was sent home to follow-up with neurology and outpatient PT. Patient developed facial droop when he woke up from sleep on 12/27 so his wife brought him to the hospital for further evaluation. In the ED patient had complete right-sided flaccidity, facial droop and dysarthria, MRI of the brain was repeated and showed dorsal extension of the left paracentral pontine stroke. Type of Study: Bedside Swallow Evaluation Previous Swallow Assessment: MBSS last week with recommendation for D3/thin Diet Prior to this Study: NPO Temperature Spikes Noted: No Respiratory Status: Nasal cannula History of Recent Intubation: No Behavior/Cognition: Alert;Cooperative;Pleasant mood Oral Cavity Assessment: Within Functional Limits Oral Care Completed by SLP: Recent completion by staff Oral Cavity - Dentition: Dentures, top Vision: Functional for self-feeding (Pt with new vision difficulties left eye) Patient  Positioning: Upright in bed Baseline Vocal Quality: Normal Volitional Cough: Strong Volitional Swallow: Able to elicit    Oral/Motor/Sensory Function Overall Oral Motor/Sensory Function: Moderate impairment Facial ROM: Reduced left;Suspected CN VII (facial) dysfunction Facial Symmetry: Abnormal symmetry left;Suspected CN VII (facial) dysfunction Facial Strength: Reduced left;Suspected CN VII (facial) dysfunction Facial Sensation: Reduced left;Suspected CN V (Trigeminal) dysfunction Lingual ROM: Within Functional Limits Lingual Symmetry:  Within Functional Limits Lingual Strength: Within Functional Limits Lingual Sensation: Suspected CN VII (facial) dysfunction-anterior 2/3 tongue Velum:  (Could not visualize, no gag reflex elicited) Mandible: Suspected CN V (Trigeminal) dysfunction;Impaired   Ice Chips Ice chips: Within functional limits Presentation: Spoon   Thin Liquid Thin Liquid: Impaired Oral Phase Impairments: Reduced labial seal Oral Phase Functional Implications: Left anterior spillage Pharyngeal  Phase Impairments: Suspected delayed Swallow    Nectar Thick     Honey Thick Honey Thick Liquid: Impaired Presentation: Cup;Spoon Oral Phase Impairments: Reduced labial seal Oral Phase Functional Implications: Left anterior spillage   Puree Puree: Impaired Presentation: Spoon Oral Phase Impairments: Reduced labial seal Oral Phase Functional Implications: Left lateral sulci pocketing;Oral residue   Solid   Thank you,  Genene Churn, CCC-SLP [phone]     Solid: Not tested       PORTER,DABNEY 05/16/2015,4:07 PM

## 2015-05-16 NOTE — Evaluation (Signed)
Physical Therapy Evaluation Patient Details Name: [name] MRN: [id number] DOB: 1940-09-07 Today's Date: 05/16/2015   History of Present Illness  [name] is a 74 y.o. male with past medical history of diabetes mellitus type 2, COPD and history of MI came into the hospital with a left facial droop and right-sided weakness. Patient admitted recently to the hospital from 12/22 till the 24th, he was admitted for similar symptoms which resolved while is in the hospital, that given the MRI showed pontine stroke but patient was able to communicate, walk and swallow without problems. He was discharged home on aspirin. On Monday patient has had worsening of right-sided weakness so he came in to the ED, and evaluated by CT scan, he was sent home to follow-up with neurology and outpatient PT. patient developed facial droop when he woke up from sleep this morning so his wife brought him to the hospital for further evaluation.   Clinical Impression   Pt was seen for evaluation in ICU.  He is very lethargic but able to open his eyes and communicate with slurred speech.  He appears to have a full visual field and acknowledges the right side.  He is able to try to follow all directions.  Pt is found to have a profound loss of strength on the right with flaccidity in the arm and leg.  He is able to roll to the right but needs full assist to roll left.  He was too drowsy to attempt to sit at EOB today but we will try to progress to this tomorrow.  He will need to transition to SNF at d/c.  I believe that his deficits are too severe to qualify for CIR.    Follow Up Recommendations SNF    Equipment Recommendations  None recommended by PT    Recommendations for Other Services OT consult     Precautions / Restrictions Precautions Precautions: Fall Restrictions Weight Bearing Restrictions: No      Mobility  Bed Mobility Overal bed mobility: Needs Assistance Bed Mobility: Rolling Rolling:  Total assist         General bed mobility comments: able to roll to the right with min assist  Transfers                 General transfer comment: too drowsy to attempt sitting  Ambulation/Gait                Stairs            Wheelchair Mobility    Modified Rankin (Stroke Patients Only) Modified Rankin (Stroke Patients Only) Pre-Morbid Rankin Score: Moderate disability Modified Rankin: Severe disability     Balance Overall balance assessment:  (unable to test)                                           Pertinent Vitals/Pain Pain Assessment: No/denies pain    Home Living Family/patient expects to be discharged to:: Skilled nursing facility Living Arrangements: Spouse/significant other                    Prior Function Level of Independence: Independent with assistive device(s)               Hand Dominance   Dominant Hand: Left    Extremity/Trunk Assessment   Upper Extremity Assessment: Defer to OT evaluation RUE Deficits /  Details: RUE appears to be flaccid by my exam         Lower Extremity Assessment: RLE deficits/detail RLE Deficits / Details: flaccid LLE       Communication   Communication: Expressive difficulties  Cognition Arousal/Alertness: Lethargic Behavior During Therapy: Flat affect Overall Cognitive Status: Within Functional Limits for tasks assessed                      General Comments      Exercises        Assessment/Plan    PT Assessment Patient needs continued PT services  PT Diagnosis Hemiplegia non-dominant side   PT Problem List Decreased strength;Decreased activity tolerance;Decreased balance;Decreased mobility;Impaired tone;Obesity  PT Treatment Interventions Functional mobility training;Therapeutic exercise;Therapeutic activities;Balance training;Neuromuscular re-education   PT Goals (Current goals can be found in the Care Plan section) Acute Rehab PT  Goals Patient Stated Goal: none stated PT Goal Formulation: Patient unable to participate in goal setting Time For Goal Achievement: 05/30/15 Potential to Achieve Goals: Fair    Frequency 7X/week   Barriers to discharge        Co-evaluation               End of Session   Activity Tolerance: Patient limited by fatigue Patient left: in bed;with call bell/phone within reach Nurse Communication: Mobility status         Time: 1200-1229 PT Time Calculation (min) (ACUTE ONLY): 29 min   Charges:   PT Evaluation $Initial PT Evaluation Tier I: 1 Procedure     PT G CodesSable Feil  PT 05/16/2015, 12:33 PM [phone]

## 2015-05-16 NOTE — Plan of Care (Signed)
Problem: Coping: Goal: Ability to verbalize positive feelings about self will improve Outcome: Progressing Discourage about not being to bath himself. Discuss with patient ability may return with rehab.

## 2015-05-16 NOTE — Progress Notes (Signed)
Report called to [facility] unit 300. Patient to be transferred to 318 via bed.

## 2015-05-16 NOTE — Evaluation (Signed)
Occupational Therapy Evaluation Patient Details Name: [name] MRN: [id number] DOB: [birth year] Today's Date: 05/16/2015    History of Present Illness [name] is a 74 y.o. male with past medical history of diabetes mellitus type 2, COPD and history of MI came into the hospital with a left facial droop and right-sided weakness. Patient admitted recently to the hospital from 12/22 till the 24th, he was admitted for similar symptoms which resolved while is in the hospital, that given the MRI showed pontine stroke but patient was able to communicate, walk and swallow without problems. He was discharged home on aspirin. On Monday patient has had worsening of right-sided weakness so he came in to the ED, and evaluated by CT scan, he was sent home to follow-up with neurology and outpatient PT. patient developed facial droop when he woke up from sleep this morning so his wife brought him to the hospital for further evaluation.    Clinical Impression   Pt awake but drowsy upon OT evaluation, family present. Pt reports no movement in RUE or RLE, however pt spontaneously moved fingers in a grasping motion during vision assessment; OT asked pt to attempt to move hand and squeeze OT's fingers, pt able to contract muscles to make squeezing motion. Upon further evaluation, pt able to contract and relax elbow flexors/extensors slightly, able to contract and relax digits slightly, strength 1/5. Pt unable to complete ADL tasks during evaluation, suspect will need max assist for ADL completion in immediate future. Pt will need SNF on discharge, will continue to follow acutely.     Follow Up Recommendations  SNF    Equipment Recommendations  None recommended by OT       Precautions / Restrictions Precautions Precautions: Fall Restrictions Weight Bearing Restrictions: No      Mobility Bed Mobility Overal bed mobility: Needs Assistance Bed Mobility: Rolling Rolling: Total assist          General bed mobility comments: unable to test due to lethargy  Transfers                 General transfer comment: unable to test due to lethargy    Balance Overall balance assessment:  (unable to test)                                          ADL Overall ADL's : Needs assistance/impaired                                       General ADL Comments: Unable to test at eval: Pt unable to use RUE/RLE to complete ADL tasks, will require max assist for ADL tasks     Vision Vision Assessment?: Yes Eye Alignment: Within Functional Limits Ocular Range of Motion: Within Functional Limits Alignment/Gaze Preference: Within Defined Limits Tracking/Visual Pursuits: Able to track stimulus in all quads without difficulty Saccades: Within functional limits Convergence: Within functional limits Visual Fields: No apparent deficits          Pertinent Vitals/Pain Pain Assessment: No/denies pain     Hand Dominance Left   Extremity/Trunk Assessment Upper Extremity Assessment Upper Extremity Assessment: RUE deficits/detail RUE Deficits / Details: Strength 1/5: Pt is able to contract muscles of RUE and close digits approximately 50% to lightly squeeze OT's fingers   Lower Extremity  Assessment Lower Extremity Assessment: Defer to PT evaluation RLE Deficits / Details: flaccid LLE       Communication Communication Communication: Expressive difficulties   Cognition Arousal/Alertness: Lethargic Behavior During Therapy: Flat affect Overall Cognitive Status: Within Functional Limits for tasks assessed                        Exercises Exercises: General Upper Extremity          Home Living Family/patient expects to be discharged to:: Skilled nursing facility Living Arrangements: Spouse/significant other                                      Prior Functioning/Environment Level of Independence: Independent with assistive  device(s)             OT Diagnosis: Generalized weakness;Hemiplegia non-dominant side   OT Problem List: Decreased strength;Decreased range of motion;Decreased activity tolerance;Impaired balance (sitting and/or standing);Impaired UE functional use   OT Treatment/Interventions: Self-care/ADL training;Therapeutic exercise;Manual therapy;Therapeutic activities;Patient/family education    OT Goals(Current goals can be found in the care plan section) Acute Rehab OT Goals Patient Stated Goal: none stated OT Goal Formulation: Patient unable to participate in goal setting Time For Goal Achievement: 05/30/15 Potential to Achieve Goals: Good  OT Frequency: Min 2X/week    End of Session    Activity Tolerance: Patient limited by lethargy Patient left: in bed;with call bell/phone within reach;with bed alarm set;with family/visitor present   Time: 1305-1330 OT Time Calculation (min): 25 min Charges:  OT General Charges $OT Visit: 1 Procedure OT Evaluation $Initial OT Evaluation Tier I: 1 Procedure  Guadelupe Sabin, OTR/L  [phone]  05/16/2015, 1:32 PM

## 2015-05-16 NOTE — Care Management Note (Signed)
Case Management Note  Patient Details  Name: [name] MRN: [id number] Date of Birth: [birth year]  Subjective/Objective:                  Pt is from home, lives with his wife and is ind with ADL's at baseline. Pt recently [facility]. Pt has cane, walker, bsc at home prior to admission. Pt has no had a continuation of his CVA and therapy has recommended SNF. CSW is aware and will work with pt/family to find placement.   Action/Plan: No CM needs.   Expected Discharge Date:   05/17/2015               Expected Discharge Plan:  Skilled Nursing Facility  In-House Referral:  Clinical Social Work  Discharge planning Services  CM Consult  Post Acute Care Choice:  NA Choice offered to:  NA  DME Arranged:    DME Agency:     HH Arranged:    [place] Agency:     Status of Service:  Completed, signed off  Medicare Important Message Given:    Date Medicare IM Given:    Medicare IM give by:    Date Additional Medicare IM Given:    Additional Medicare Important Message give by:     If discussed at [place] of Stay Meetings, dates discussed:    Additional Comments:  Sherald Barge, RN 05/16/2015, 2:06 PM

## 2015-05-16 NOTE — Progress Notes (Signed)
PROGRESS NOTE  [name] [id number] DOB: 10-01-1940 DOA: 05/15/2015 PCP: Tula Nakayama, MD  Summary: 36 yom PMHx of diabetes mellitus type 2, COPD and history of MI came into the hospital with a left facial droop and right-sided weakness. Patient admitted recently to the hospital from 12/22 till the 24th, he was admitted for similar symptoms which resolved while is in the hospital, that given the MRI showed pontine stroke but patient was able to communicate, walk and swallow without problems. He was discharged home on aspirin. On Monday patient has had worsening of right-sided weakness so he came in to the ED, and evaluated by CT scan, he was sent home to follow-up with neurology and outpatient PT. Patient developed facial droop when he woke up from sleep on 12/27 so his wife brought him to the hospital for further evaluation. In the ED patient had complete right-sided flaccidity, facial droop and dysarthria, MRI of the brain was repeated and showed dorsal extension of the left paracentral pontine stroke.  Assessment/Plan: 1. Left paracentral pontine stroke with RUE/RLE flaccidity, facial droop, and dysarthria. MRI of the brain shows dorsal extension of the left paracentral pontine stroke seen on 12/22. CTA without significant occlusion. LDL 78. HgbA1C 7.1. ASA recently increased. Carotid dopplers revealed no significant stenosis bilaterally. Neurology, PT, and OT consulted.  2. Dysarthia, dysphagia, speech therapy consulted.   3. Suspected partial seizures on last admission, started on Keppra during last admission. Continue IV Keppra pending ST recommendations. No evidence of seizures. 4. DM type 2, stable. Continue SSI 5. Hypothyroidism, continue Synthroid 6. Dyslipidemia, hold statin while he is NPO.  7. Coronary atherosclerosis. 8. Vitamin B12 deficiency. 9. Chronic leukocytosis, unclear etiology. F/u as an outpatient.  10. Normocytic anemia, chronic, stable.    Exam appears  unchanged, await neurology recommendations. Has profound weakness, will need SNF. Await ST and therapy evaluations. F/u neuro recommendations.   Code Status: Full DVT prophylaxis: SCDs Family Communication: No family at bedside. Disposition Plan: Monitor in ICU.   John Hodgkins, MD  [facility]  Pager [phone] If 7PM-7AM, please contact night-coverage at [email or web address], password [facility] 05/16/2015, 6:34 AM  LOS: 1 day   Consultants:    Procedures:    Antibiotics:     HPI/Subjective: Feels okay. Denies any SOB, pain, nausea, or vomiting. Feels hungry. Right side is still weak. Denies trouble swallowing.   Objective: Filed Vitals:   05/16/15 0200 05/16/15 0300 05/16/15 0400 05/16/15 0500  BP: 132/85 124/64 130/69 114/67  Pulse: 78 69 71 74  Temp:   97.7 F (36.5 C)   TempSrc:   Oral   Resp: 18 16 11 18   Height:      Weight:    103.7 kg (228 lb 9.9 oz)  SpO2: 96% 97% 98% 97%    Intake/Output Summary (Last 24 hours) at 05/16/15 0634 Last data filed at 05/16/15 0500  Gross per 24 hour  Intake      0 ml  Output    800 ml  Net   -800 ml    Filed Weights   05/15/15 1148 05/15/15 1815 05/16/15 0500  Weight: 106.142 kg (234 lb) 104 kg (229 lb 4.5 oz) 103.7 kg (228 lb 9.9 oz)    Exam:    VSS, afebrile General:  Appears comfortable, calm. Eyes: Normal lids and irises. Right pupil is 51mm and reactive, left pupil is 59mm and reactive.  ENT: grossly unremarkable however limited Cardiovascular: Regular rate and rhythm, no murmur, rub or gallop. No lower  extremity edema. Telemetry: Sinus rhythm, no arrhythmias  Respiratory: Clear to auscultation bilaterally, no wheezes, rales or rhonchi. Normal respiratory effort. Abdomen: soft, ntnd Musculoskeletal: able to squeeze with right hand, unable to lift right arm off bed. LUE strength appears grossly normal. RLE 1/5 strength, able to wiggle toes a bit.  Psychiatric: grossly normal mood and affect, speech fluent and  appropriate Neurologic: obvious left facial weakness, remainder as above.    New data reviewed:  CBG stable  Pertinent data since admission:  CMP unremarkable  Hgb 10.9, WBC 13.3  MRI of the brain - Dorsal extension of the left paracentral pontine infarct since the 22nd. No associated hemorrhage or mass effect.  Scheduled Meds: .  stroke: mapping our early stages of recovery book   Does not apply Once  . aspirin  300 mg Rectal Daily   Or  . aspirin  325 mg Oral Daily  . Chlorhexidine Gluconate Cloth  6 each Topical Q0600  . heparin  5,000 Units Subcutaneous 3 times per day  . insulin aspart  0-9 Units Subcutaneous TID WC  . levETIRAcetam  500 mg Intravenous Q12H  . levothyroxine  37 mcg Intravenous Daily  . mupirocin ointment  1 application Nasal BID  . pantoprazole (PROTONIX) IV  40 mg Intravenous Q24H   Continuous Infusions:   Principal Problem:   CVA (cerebral infarction) Active Problems:   Diabetes mellitus, insulin dependent (IDDM), uncontrolled ([place])   Hypothyroid   Partial seizure ([place])   Facial droop   Dysarthria   Time spent 30 minutes   By signing my name below, I, Rosalie Doctor attest that this documentation has been prepared under the direction and in the presence of John Hodgkins, MD Electronically signed: Rosalie Doctor, Scribe.  05/16/2015 10:01am  I personally performed the services described in this documentation. All medical record entries made by the scribe were at my direction. I have reviewed the chart and agree that the record reflects my personal performance and is accurate and complete. John Hodgkins, MD

## 2015-05-17 LAB — GLUCOSE, CAPILLARY
GLUCOSE-CAPILLARY: 139 mg/dL — AB (ref 65–99)
Glucose-Capillary: 126 mg/dL — ABNORMAL HIGH (ref 65–99)
Glucose-Capillary: 126 mg/dL — ABNORMAL HIGH (ref 65–99)
Glucose-Capillary: 137 mg/dL — ABNORMAL HIGH (ref 65–99)

## 2015-05-17 MED ORDER — ALLOPURINOL 300 MG PO TABS
300.0000 mg | ORAL_TABLET | Freq: Every day | ORAL | Status: DC
  Administered 2015-05-17 – 2015-05-18 (×2): 300 mg via ORAL
  Filled 2015-05-17 (×2): qty 1

## 2015-05-17 MED ORDER — PRAVASTATIN SODIUM 40 MG PO TABS
80.0000 mg | ORAL_TABLET | Freq: Every day | ORAL | Status: DC
  Administered 2015-05-17: 80 mg via ORAL
  Filled 2015-05-17 (×2): qty 2

## 2015-05-17 MED ORDER — CLOPIDOGREL BISULFATE 75 MG PO TABS
75.0000 mg | ORAL_TABLET | Freq: Every day | ORAL | Status: DC
  Administered 2015-05-17 – 2015-05-18 (×2): 75 mg via ORAL
  Filled 2015-05-17 (×2): qty 1

## 2015-05-17 MED ORDER — ALBUTEROL SULFATE HFA 108 (90 BASE) MCG/ACT IN AERS: 1.0000 | INHALATION_SPRAY | Freq: Four times a day (QID) | RESPIRATORY_TRACT | Status: DC | PRN

## 2015-05-17 MED ORDER — ALBUTEROL SULFATE (2.5 MG/3ML) 0.083% IN NEBU: 2.5000 mg | INHALATION_SOLUTION | Freq: Four times a day (QID) | RESPIRATORY_TRACT | Status: DC | PRN

## 2015-05-17 MED ORDER — LEVETIRACETAM 500 MG PO TABS
500.0000 mg | ORAL_TABLET | Freq: Two times a day (BID) | ORAL | Status: DC
  Administered 2015-05-17 – 2015-05-18 (×3): 500 mg via ORAL
  Filled 2015-05-17 (×3): qty 1

## 2015-05-17 MED ORDER — PANTOPRAZOLE SODIUM 40 MG PO TBEC
40.0000 mg | DELAYED_RELEASE_TABLET | Freq: Every day | ORAL | Status: DC
  Administered 2015-05-17 – 2015-05-18 (×2): 40 mg via ORAL
  Filled 2015-05-17 (×2): qty 1

## 2015-05-17 MED ORDER — SPIRONOLACTONE 25 MG PO TABS
25.0000 mg | ORAL_TABLET | Freq: Every day | ORAL | Status: DC
  Administered 2015-05-17 – 2015-05-18 (×2): 25 mg via ORAL
  Filled 2015-05-17 (×2): qty 1

## 2015-05-17 MED ORDER — LEVOTHYROXINE SODIUM 75 MCG PO TABS
75.0000 ug | ORAL_TABLET | Freq: Every day | ORAL | Status: DC
  Administered 2015-05-18: 75 ug via ORAL
  Filled 2015-05-17: qty 1

## 2015-05-17 NOTE — Progress Notes (Signed)
PROGRESS NOTE  [name] [id number] DOB: November 28, 1940 DOA: 05/15/2015 PCP: Tula Nakayama, MD  Summary: 67 yom PMHx of diabetes mellitus type 2, COPD and history of MI came into the hospital with a left facial droop and right-sided weakness. Patient admitted recently to the hospital from 12/22 till the 24th, he was admitted for similar symptoms which resolved while is in the hospital, that given the MRI showed pontine stroke but patient was able to communicate, walk and swallow without problems. He was discharged home on aspirin. On Monday patient has had worsening of right-sided weakness so he came in to the ED, and evaluated by CT scan, he was sent home to follow-up with neurology and outpatient PT. Patient developed facial droop when he woke up from sleep on 12/27 so his wife brought him to the hospital for further evaluation. In the ED patient had complete right-sided flaccidity, facial droop and dysarthria, MRI of the brain was repeated and showed dorsal extension of the left paracentral pontine stroke.  Assessment/Plan: 1. Left paracentral pontine stroke with RUE/RLE flaccidity, facial droop, and dysarthria. LDL 78. Hgb A1C 7.1. Neurology consulted and recommends dual antiplatelets for 3 months and then Plavix. PT and OT recommend SNF.  2. Dysarthia, dysphagia, speech therapy consulted and recommends dysphagia 1 (Puree);Honey-thick liquid . Tolerating diet.  3. Suspected partial seizures on last admission, started on Keppra during last admission. Continue Keppra, no evidence of seizures. 4. DM type 2, stable. Continue SSI 5. Hypothyroidism, continue Synthroid 6. Dyslipidemia, continue statin.  7. Coronary atherosclerosis. 8. Vitamin B12 deficiency. 9. Chronic leukocytosis, unclear etiology. F/u as an outpatient.  10. Normocytic anemia, chronic, stable. 11. Constipation, will start bowel regimen.    Overall unchanged. Continue ASA and start Plavix.  Start bowel regimen.    Likely discharge to [facility] 12/30.   Code Status: Full DVT prophylaxis: Heparin Family Communication:  Wife at bedside. Disposition Plan: Anticipate discharge tomorrow.   [name], MD  [facility]  Pager [phone] If 7PM-7AM, please contact night-coverage at [email or web address], password [facility] 05/17/2015, 6:58 AM  LOS: 2 days   Consultants:  Neurology  PT- SNF.   OT- SNF  ST- Dysphagia 1 (Puree);Honey-thick liquid   Procedures:    Antibiotics:    HPI/Subjective: Feels good. Denies any pain, nausea, or vomiting. Has been eating well.   Objective: Filed Vitals:   05/16/15 1800 05/16/15 2200 05/16/15 2208 05/17/15 0200  BP: 126/66 131/64  133/61  Pulse: 94 84 85 76  Temp:  98 F (36.7 C)  98.2 F (36.8 C)  TempSrc:  Oral  Oral  Resp: 20  16 18   Height:      Weight:      SpO2: 96% 95% 93% 96%    Intake/Output Summary (Last 24 hours) at 05/17/15 0658 Last data filed at 05/17/15 0519  Gross per 24 hour  Intake 633.33 ml  Output   1550 ml  Net -916.67 ml    Filed Weights   05/15/15 1148 05/15/15 1815 05/16/15 0500  Weight: 106.142 kg (234 lb) 104 kg (229 lb 4.5 oz) 103.7 kg (228 lb 9.9 oz)    Exam:    VSS, afebrile General:  Appears calm and comfortable Cardiovascular: RRR, no m/r/g. No LE edema. Respiratory: CTA bilaterally, no w/r/r. Normal respiratory effort. Abdomen: soft, ntnd Skin: no rash or induration noted Musculoskeletal: LLE strength normal, lifts off bed easily. RLE strength 1/5, able to almost lift off bed. Sensation intact. LUE strength 5/5, able to lift. Strength in  RUE 1/5, weak grip strength. Psychiatric: grossly normal mood and affect, speech fluent and appropriate Neurologic: No change in left facial weakness.   New data reviewed:  CBG stable  Pertinent data since admission:  CMP unremarkable  Hgb 10.9, WBC 13.3  MRI of the brain - Dorsal extension of the left paracentral pontine infarct since the 22nd. No  associated hemorrhage or mass effect.  Pending Data:  UC  Scheduled Meds: . aspirin  300 mg Rectal Daily   Or  . aspirin  325 mg Oral Daily  . Chlorhexidine Gluconate Cloth  6 each Topical Q0600  . heparin  5,000 Units Subcutaneous 3 times per day  . insulin aspart  0-9 Units Subcutaneous Q6H  . levETIRAcetam  500 mg Intravenous Q12H  . levothyroxine  37 mcg Intravenous Daily  . mupirocin ointment  1 application Nasal BID  . pantoprazole (PROTONIX) IV  40 mg Intravenous Q24H   Continuous Infusions: . dextrose 5 % and 0.45 % NaCl with KCl 10 mEq/L 125 mL/hr (05/17/15 0512)    Principal Problem:   CVA (cerebral infarction) Active Problems:   Diabetes mellitus, insulin dependent (IDDM), uncontrolled ([facility])   Hypothyroid   Partial seizure ([place])   Facial droop   Dysarthria   Right sided weakness   Time spent 20 minutes   By signing my name below, I, Rosalie Doctor attest that this documentation has been prepared under the direction and in the presence of [name], MD Electronically signed: Rosalie Doctor, Scribe.  05/17/2015 11:16am  I personally performed the services described in this documentation. All medical record entries made by the scribe were at my direction. I have reviewed the chart and agree that the record reflects my personal performance and is accurate and complete. [name], MD

## 2015-05-17 NOTE — Progress Notes (Signed)
Initial Nutrition Assessment  DOCUMENTATION CODES:  Obesity unspecified  INTERVENTION:  Magic cup TID with meals, each supplement provides 290 kcal and 9 grams of protein  Took meal/food preferences   NUTRITION DIAGNOSIS:  Biting/chewing/swallowing difficulty related to stroke as evidenced by Eval by SLP and Need for puree/HT diet.  GOAL:  Patient will meet greater than or equal to 90% of their needs  MONITOR:  PO intake, Supplement acceptance, Diet advancement, Weight trends  REASON FOR ASSESSMENT:  Low [place]    ASSESSMENT:  74 y/o male  PMHx DM2, COPD, MI presented w/ worsening of facial droop that he was just admitted/discharged for. MRI shows extension of prior stroke.   Spoke w/ wife and pt. PTA patient had no trouble with eating. Wife reports that pt would eat, but he would get choked on the food. In reality his intake did not suffer too much. He did not take vit/min supplements at home. He appears to have been found to have a b12 deficiency at the beginning of the month.  Pts normal weight was reported to be 236 lbs. Wt appears to fluctuate 230-240 lbs.   Pt managed to eat about 50% of one of his lunch today. It is anticipated that pt will initially experience trouble staying nourished due to drastic diet change. Pt himself did not seem to fully understand as he kept asking for pie.   Pt/wife were agreeable to [employer].   Diet Order:  DIET - DYS 1 Room service appropriate?: Yes; Fluid consistency:: Honey Thick  Skin:  Dry/Flaky  Last BM:  12/22  Height:  Ht Readings from Last 1 Encounters:  05/15/15 5\' 10"  (1.778 m)   Weight:  Wt Readings from Last 1 Encounters:  05/16/15 228 lb 9.9 oz (103.7 kg)   Wt Readings from Last 10 Encounters:  05/16/15 228 lb 9.9 oz (103.7 kg)  05/14/15 234 lb (106.142 kg)  05/10/15 236 lb (107.049 kg)  05/01/15 238 lb 8 oz (108.183 kg)  04/26/15 242 lb 12.8 oz (110.133 kg)  04/26/15 230 lb (104.327 kg)  04/23/15 240 lb  (108.863 kg)  04/20/15 230 lb (104.327 kg)  04/19/15 240 lb 12.8 oz (109.226 kg)  01/31/15 230 lb (104.327 kg)   Ideal Body Weight:  75.45 kg  BMI:  Body mass index is 32.8 kg/(m^2).  Estimated Nutritional Needs:  Kcal:  1650-1850 kcals (16-18 kcal/kg bw) Protein:  60-75 g (.8-1 g/kg bw) Fluid:  1.7-1.9 liters fluid  EDUCATION NEEDS:  No education needs identified at this time  Burtis Junes RD, LDN Nutrition Pager: [phone] 05/17/2015 2:11 PM

## 2015-05-17 NOTE — Progress Notes (Signed)
Speech Language Pathology Treatment: Dysphagia  Patient Details Name: [name] MRN: [id number] DOB: May 31, 1940 Today's Date: 05/17/2015 Time: [id number] SLP Time Calculation (min) (ACUTE ONLY): 34 min  Assessment / Plan / Recommendation Clinical Impression  [name] was seen early this afternoon (after lunch) for dysphagia intervention which included diet tolerance, trials of upgraded consistencies, instruction of compensatory strategies, and skilled differential diagnosis. [name] was excited to show me that he could move his right hand and right leg slightly today, which is an improvement from yesterday. He was able to help me reposition him in bed by holding onto the bed rail with his left arm and pulling himself forward with mi/mod assist from SLP. He was able to state two strategies for safer swallow as presented yesterday, but verbalized that it was often difficult for him to initiate a second swallow (as instructed yesterday) when he didn't have anything his mouth.   SLP provided oral care (pt greatly needed) and sensory stim to parts of oral cavity prior to presenting with po trials. Pt with strong and immediate cough after a large ice chip. He tolerated 1/2 cup orange sherbet (NTL) via teaspoon presentations from SLP with moderate cues for secondary swallows and to avoid talking during trials. Pt responded well to SLP cues to move tongue into left buccal pocket to help clear oral residuals. Pt is making good progress and would likely tolerate a diet upgrade once in a rehab environment where he can be monitored closely during meals, however given poor oral care and less 1:1 assistance in acute setting, he needs to continue with D1/puree and HTL for now. Pt continues to be an excellent candidate for inpatient rehab program from a speech/swallowing standpoint.     HPI HPI: 51 yom PMHx of diabetes mellitus type 2, COPD and history of MI came into the hospital with a left facial  droop and right-sided weakness. Patient admitted recently to the hospital from 12/22 till the 24th, he was admitted for similar symptoms which resolved while is in the hospital, that given the MRI showed pontine stroke but patient was able to communicate, walk and swallow without problems. He was discharged home on aspirin. On Monday patient has had worsening of right-sided weakness so he came in to the ED, and evaluated by CT scan, he was sent home to follow-up with neurology and outpatient PT. Patient developed facial droop when he woke up from sleep on 12/27 so his wife brought him to the hospital for further evaluation. In the ED patient had complete right-sided flaccidity, facial droop and dysarthria, MRI of the brain was repeated and showed dorsal extension of the left paracentral pontine stroke.      SLP Plan  Continue with current plan of care     Recommendations  Diet recommendations: Dysphagia 1 (puree);Honey-thick liquid Liquids provided via: Teaspoon;Cup Medication Administration: Crushed with puree Supervision: Staff to assist with self feeding;Full supervision/cueing for compensatory strategies Compensations: Minimize environmental distractions;Slow rate;Small sips/bites;Lingual sweep for clearance of pocketing;Multiple dry swallows after each bite/sip Postural Changes and/or Swallow Maneuvers: Seated upright 90 degrees;Upright 30-60 min after meal              General recommendations: Rehab consult Oral Care Recommendations: Oral care BID;Staff/trained caregiver to provide oral care Follow up Recommendations: Inpatient Rehab Plan: Continue with current plan of care  Thank you,  Genene Churn, [place]  [place] 05/17/2015, 5:20 PM

## 2015-05-17 NOTE — NC FL2 (Signed)
[place] LEVEL OF CARE SCREENING TOOL     IDENTIFICATION  Patient Name: [name] Birthdate: 1941/05/05 Sex: male Admission Date (Current Location): 05/15/2015  [facility] and [place] Number:  [employer] and Address:  [facility] [address], [place]      Provider Number: [id number]  Attending Physician Name and Address:  Samuella Cota, MD  Relative Name and Phone Number:       Current Level of Care: Hospital Recommended Level of Care: [place] Prior Approval Number:    Date Approved/Denied:   PASRR Number:  ([id number])  Discharge Plan: SNF    Current Diagnoses: Patient Active Problem List   Diagnosis Date Noted  . Right sided weakness   . Facial droop 05/15/2015  . Dysarthria 05/15/2015  . Partial seizure ([place]) 05/12/2015  . Right hemiparesis ([place]) 05/12/2015  . Stroke ([place]) 05/10/2015  . GERD (gastroesophageal reflux disease) 05/10/2015  . CVA (cerebral infarction) 05/10/2015  . Hyperkalemia 05/10/2015  . Acute kidney injury (nontraumatic) ([place])   . Vitamin B12 deficiency 05/01/2015  . Abnormality of gait 05/01/2015  . Tussive syncope 05/01/2015  . Hospital discharge follow-up 04/29/2015  . Recurrent falls 04/29/2015  . Dysphagia 04/26/2015  . Near syncope 04/26/2015  . Subacute confusional state 04/26/2015  . COPD exacerbation ([place]) 04/19/2015  . Anemia 04/19/2015  . AKI (acute kidney injury) ([place]) 04/19/2015  . COPD with acute exacerbation ([place]) 02/11/2015  . Acute cystitis without hematuria 02/11/2015  . Carotid bruit 12/08/2013  . Angina pectoris ([place]) 08/30/2013  . Type 2 diabetes mellitus with diabetic nephropathy ([place]) 05/22/2013  . Onychomycosis of toenail 05/22/2013  . COPD (chronic obstructive pulmonary disease) ([place]) 11/22/2012  . Sleep related hypoxia 08/30/2012  . Vitamin D deficiency 12/23/2011  . Hypothyroid 12/23/2011  . DERMATOPHYTOSIS OF FOOT 07/22/2010   . PSORIASIS 07/18/2010  . HIP PAIN, BILATERAL 07/18/2010  . Gout 01/13/2010  . Diabetes mellitus, insulin dependent (IDDM), uncontrolled ([place]) 11/28/2008  . Hyperlipidemia with target LDL less than 70 10/05/2007  . Obesity (BMI 30.0-34.9) 10/05/2007  . Essential hypertension 10/05/2007  . Coronary atherosclerosis 10/05/2007  . GERD 10/05/2007  . Arthritis of knee, right 10/05/2007    Orientation RESPIRATION BLADDER Height & Weight    Self, Time, Situation  Normal Continent 5\' 10"  (177.8 cm) 228 lbs.  BEHAVIORAL SYMPTOMS/MOOD NEUROLOGICAL BOWEL NUTRITION STATUS      Continent  (DIET DYS 1. Fluid consistency Honey Thick. Heart Healthy. 100% supervision for eating/drinking. No thin liquids/water. PO meds crushed as able in puree.  )  AMBULATORY STATUS COMMUNICATION OF NEEDS Skin   Extensive Assist Verbally Normal                       Personal Care Assistance Level of Assistance  Bathing, Feeding, Dressing Bathing Assistance: Maximum assistance Feeding assistance: Maximum assistance Dressing Assistance: Maximum assistance     Functional Limitations Info             SPECIAL CARE FACTORS FREQUENCY  Speech therapy     PT Frequency: 7x/week OT Frequency: 2x/week     Speech Therapy Frequency:  (3x/week)      Contractures      Additional Factors Info  Insulin Sliding Scale       Insulin Sliding Scale Info:  (0-9 units every 6 hours)       Current Medications (05/17/2015):  This is the current hospital active medication list Current Facility-Administered  Medications  Medication Dose Route Frequency Provider Last Rate Last Dose  . albuterol (PROVENTIL HFA;VENTOLIN HFA) 108 (90 Base) MCG/ACT inhaler 1-2 puff  1-2 puff Inhalation Q6H PRN Samuella Cota, MD      . allopurinol (ZYLOPRIM) tablet 300 mg  300 mg Oral Daily Samuella Cota, MD      . aspirin tablet 325 mg  325 mg Oral Daily Verlee Monte, MD   325 mg at 05/17/15 0853  . Chlorhexidine Gluconate  Cloth 2 % PADS 6 each  6 each Topical Q0600 Verlee Monte, MD   6 each at 05/17/15 0519  . clopidogrel (PLAVIX) tablet 75 mg  75 mg Oral Daily Samuella Cota, MD      . heparin injection 5,000 Units  5,000 Units Subcutaneous 3 times per day Verlee Monte, MD   5,000 Units at 05/17/15 0519  . hydrALAZINE (APRESOLINE) injection 10 mg  10 mg Intravenous Q6H PRN Verlee Monte, MD      . insulin aspart (novoLOG) injection 0-9 Units  0-9 Units Subcutaneous Q6H Samuella Cota, MD   1 Units at 05/17/15 [phone]  . levETIRAcetam (KEPPRA) tablet 500 mg  500 mg Oral BID Samuella Cota, MD      . Derrill Memo ON 05/18/2015] levothyroxine (SYNTHROID, LEVOTHROID) tablet 75 mcg  75 mcg Oral QAC breakfast Samuella Cota, MD      . mupirocin ointment (BACTROBAN) 2 % 1 application  1 application Nasal BID Verlee Monte, MD   1 application at [id number] [phone]  . pantoprazole (PROTONIX) injection 40 mg  40 mg Intravenous Q24H Verlee Monte, MD   40 mg at 05/16/15 1737  . pravastatin (PRAVACHOL) tablet 80 mg  80 mg Oral q1800 Samuella Cota, MD      . [place]   Oral PRN Samuella Cota, MD      . spironolactone (ALDACTONE) tablet 25 mg  25 mg Oral Daily Samuella Cota, MD         Discharge Medications: Please see discharge summary for a list of discharge medications.  Relevant Imaging Results:  Relevant Lab Results:   Additional Information    Corina Stacy, Clydene Pugh, LCSW

## 2015-05-17 NOTE — Progress Notes (Signed)
The patient is receiving Protonix by the intravenous route.  Based on criteria approved by the Pharmacy and [place], the medication is being converted to the equivalent oral dose form.  These criteria include: -No Active GI bleeding -Able to tolerate diet of full liquids (or better) or tube feeding OR able to tolerate other medications by the oral or enteral route  If you have any questions about this conversion, please contact the Pharmacy Department (ext [phone]).  Thank you.  [name], [name] 05/17/2015 3:22 PM

## 2015-05-17 NOTE — Progress Notes (Signed)
Patient ID: [name], male   DOB: 05-27-1940, 74 y.o.   MRN: [id number]    [place] A. Merlene Laughter, MD     [email or web address]          [name] is an 74 y.o. male.   Assessment/Plan:   Acute left pontine infarct - extension of previous left pontine infarct. Deficits include severe dysarthria, dysphasia, left Horner's syndrome, right hemiparesis/hemiplegia, left lower facial weakness and a left sixth nerve palsy. This is likely due to thrombosis of a perforating vessel off the basilar artery. Risk factors hypertension, age, dyslipidemia, and coronary artery disease.  Unexplained dysphagia over the last several months with difficulty swallowing liquids.  Cough and swallowing due to induced syncope.  Gait impairment.  Vitamin B12 deficiency.  Pseudobulbar affect.  Likely obstructive sleep apnea syndrome.  RECOMMENDATION: Dual antiplatelet agent for 3 months. Subsequently, the patient should be placed on a single agent. Plavix is probably preferable given his recurrent stroke on aspirin. Continue with other risk factor modification including evaluation and treatment of obstructive status syndrome, statin medication, and blood pressure control.    Doing about the same.   GENERAL: Pleasant in no acute distress.  HEENT: Supple. Atraumatic normocephalic.   ABDOMEN: soft  EXTREMITIES: No edema   BACK: Normal.  SKIN: Normal by inspection.    MENTAL STATUS: Alert and oriented. The patient states his age and the month and appropriately. He has severe dysarthria. Language and cognition are generally intact. Judgment and insight normal.   CRANIAL NERVES: Pupils R 5 mm and reactive, left 3 mm - reactive both around; extra ocular movements are full on the R, however, there is plegia of the left lateral rectus with associated nystagmus on the right ductions bilaterally; visual fields are full; there is significant stenosis of the lower left facial  muscle, there is also moderate ptosis on the left; tongue is midline; uvula is midline; shoulder elevation is normal.  MOTOR: Right upper extremity 2/5 and right lower extremity 2/5. Severe right upper extremity and right lower extremity. Left upper and lower extremities shows normal tone, bulk and strength. No drift on the left side.  COORDINATION: Left finger to nose is normal, right finger to nose is normal, No rest tremor; no intention tremor; no postural tremor; no bradykinesia.  REFLEXES: Deep tendon reflexes are symmetrical and normal.   SENSATION: Normal to light touch.   NIH Stroke scale 9.    Objective: Vital signs in last 24 hours: Temp:  [97.8 F (36.6 C)-98.2 F (36.8 C)] 98.1 F (36.7 C) (12/29 1800) Pulse Rate:  [76-91] 91 (12/29 1800) Resp:  [16-18] 18 (12/29 1800) BP: (105-155)/(61-90) 105/90 mmHg (12/29 1800) SpO2:  [82 %-98 %] 97 % (12/29 1800)  Intake/Output from previous day: 12/28 0701 - 12/29 0700 In: 2537.5 [I.V.:2437.5; IV Piggyback:100] Out: [id number] [Urine:1550] Intake/Output this shift: Total I/O In: 120 [P.O.:120] Out: 300 [Urine:300] Nutritional status: DIET - DYS 1 Room service appropriate?: Yes; Fluid consistency:: Honey Thick   Lab Results: Results for orders placed or performed during the hospital encounter of 05/15/15 (from the past 48 hour(s))  Glucose, capillary     Status: Abnormal   Collection Time: 05/15/15  9:21 PM  Result Value Ref Range   Glucose-Capillary 124 (H) 65 - 99 mg/dL   Comment 1 Notify RN   MRSA PCR Screening     Status: Abnormal   Collection Time: 05/15/15  9:30 PM  Result Value Ref Range   MRSA by PCR  POSITIVE (A) NEGATIVE    Comment:        The GeneXpert MRSA Assay (FDA approved for NASAL specimens only), is one component of a comprehensive MRSA colonization surveillance program. It is not intended to diagnose MRSA infection nor to guide or monitor treatment for MRSA infections. RESULT CALLED TO, READ BACK  BY AND VERIFIED WITH: MUNDY R AT 0107 ON [id number] BY FORSYTH K   Glucose, capillary     Status: None   Collection Time: 05/16/15  7:56 AM  Result Value Ref Range   Glucose-Capillary 86 65 - 99 mg/dL   Comment 1 Notify RN    Comment 2 Document in Chart   Glucose, capillary     Status: None   Collection Time: 05/16/15 11:19 AM  Result Value Ref Range   Glucose-Capillary 91 65 - 99 mg/dL   Comment 1 Notify RN    Comment 2 Document in Chart   Glucose, capillary     Status: Abnormal   Collection Time: 05/16/15  5:33 PM  Result Value Ref Range   Glucose-Capillary 127 (H) 65 - 99 mg/dL  Glucose, capillary     Status: Abnormal   Collection Time: 05/17/15 12:13 AM  Result Value Ref Range   Glucose-Capillary 126 (H) 65 - 99 mg/dL   Comment 1 Notify RN    Comment 2 Document in Chart   Glucose, capillary     Status: Abnormal   Collection Time: 05/17/15  7:02 AM  Result Value Ref Range   Glucose-Capillary 126 (H) 65 - 99 mg/dL  Glucose, capillary     Status: Abnormal   Collection Time: 05/17/15 11:14 AM  Result Value Ref Range   Glucose-Capillary 137 (H) 65 - 99 mg/dL   Comment 1 Notify RN    Comment 2 Document in Chart   Glucose, capillary     Status: Abnormal   Collection Time: 05/17/15  5:30 PM  Result Value Ref Range   Glucose-Capillary 139 (H) 65 - 99 mg/dL   Comment 1 Notify RN    Comment 2 Document in Chart     Lipid Panel No results for input(s): CHOL, TRIG, HDL, CHOLHDL, VLDL, LDLCALC in the last 72 hours.  Studies/Results:   Medications:  Scheduled Meds: . allopurinol  300 mg Oral Daily  . aspirin  325 mg Oral Daily  . Chlorhexidine Gluconate Cloth  6 each Topical Q0600  . clopidogrel  75 mg Oral Daily  . heparin  5,000 Units Subcutaneous 3 times per day  . insulin aspart  0-9 Units Subcutaneous Q6H  . levETIRAcetam  500 mg Oral BID  . [START ON 05/18/2015] levothyroxine  75 mcg Oral QAC breakfast  . mupirocin ointment  1 application Nasal BID  . pantoprazole   40 mg Oral Daily  . pravastatin  80 mg Oral q1800  . spironolactone  25 mg Oral Daily   Continuous Infusions:   PRN Meds:.albuterol, hydrALAZINE, RESOURCE THICKENUP CLEAR     LOS: 2 days   Coralyn Roselli A. Merlene Laughter, M.D.  Diplomate, Tax adviser of Psychiatry and Neurology ( Neurology).

## 2015-05-17 NOTE — Progress Notes (Addendum)
Occupational Therapy Treatment Patient Details Name: [name] MRN: [id number] DOB: 01/24/1941 Today's Date: 05/17/2015    History of present illness [name] is a 74 y.o. male with past medical history of diabetes mellitus type 2, COPD and history of MI came into the hospital with a left facial droop and right-sided weakness. Patient admitted recently to the hospital from 12/22 till the 24th, he was admitted for similar symptoms which resolved while is in the hospital, that given the MRI showed pontine stroke but patient was able to communicate, walk and swallow without problems. He was discharged home on aspirin. On Monday patient has had worsening of right-sided weakness so he came in to the ED, and evaluated by CT scan, he was sent home to follow-up with neurology and outpatient PT. patient developed facial droop when he woke up from sleep this morning so his wife brought him to the hospital for further evaluation.    OT comments  Pt awake this afternoon, has difficulty keeping eyes open at times, however was fully alert during OT session. Pt agreeable to perform exercises and bed mobility this session. Pt shows improvements in strength of RUE and RLE as he now demonstrates 2/5 strength in RUE and RLE. Pt able to shrug shoulders, facilitate muscle movement gravity eliminated throughout RUE, point index finger, and make approximately 50-60% fist/grasp. Pt also demonstrates movement of hamstrings and quads while supine and seated. Pt performed bed mobility this session, mod A +2 for safety. Pt demonstrates fair sitting balance, able to sit unassisted for approximately 5 min, also able to self-correct when beginning to lean back or to left. While in sitting pt able to extend RLE slightly against gravity. Pt is left dominant and is able to perform basic ADL tasks at bed level with set-up using LUE; requires assistance for tasks requiring BUE or RUE.  I now feel pt may benefit from skilled  occupational therapy services in an inpatient rehab setting versus SNF to maximize independence and safety in ADL completion, address strength, activity tolerance, patient/family education, improving quality of life, and decrease caregiver burden.    Follow Up Recommendations  CIR    Equipment Recommendations  None recommended by OT    Recommendations For Other Services Rehab Consult   Precautions / Restrictions Precautions Precautions: Fall Restrictions Weight Bearing Restrictions: No       Mobility Bed Mobility Overal bed mobility: Needs Assistance Bed Mobility: Supine to Sit     Supine to sit: Mod assist;+2 for physical assistance;+2 for safety/equipment;HOB elevated     General bed mobility comments: Pt able to perform bed mobility, supine to EOB today. Pt required assistance for right side, able to use LUE and assist in shifting and turning to EOB.   Transfers                 General transfer comment: not tested    Balance                                   ADL Overall ADL's : Needs assistance/impaired Eating/Feeding: Set up;Bed level   Grooming: Wash/dry face;Set up;Bed level                     Toilet Transfer Details (indicate cue type and reason): Pt able to use urinal at bed level independently.            General ADL  Comments: Pt is left dominant and was able to complete ADL tasks using LUE. Pt requires assistance for tasks requiring use of BUE                Cognition   Behavior During Therapy: WFL for tasks assessed/performed Overall Cognitive Status: Within Functional Limits for tasks assessed                       Extremity/Trunk Assessment  Upper Extremity Assessment Upper Extremity Assessment: RUE deficits/detail RUE Deficits / Details: Strength 2/5, pt is able to facilitate muscles contraction of all muscle groups in RUE. Pt able to shrug shoulders, close fist to approximately 50%, point index  finger today, 05/17/2015   Lower Extremity Assessment Lower Extremity Assessment: RLE deficits/detail RLE Deficits / Details: Strength 2/5, pt was able to facilitate muscle contractions of hamstings and quads while supine, as well as flex and extend toes today, 05/17/2015        Exercises General Exercises - Upper Extremity Shoulder Flexion: PROM;5 reps Shoulder ABduction: PROM;5 reps Shoulder ADduction: PROM;5 reps Shoulder Horizontal ABduction: PROM;5 reps Shoulder Horizontal ADduction: PROM;5 reps Elbow Flexion: PROM;5 reps Elbow Extension: PROM;5 reps Wrist Flexion: PROM;5 reps Wrist Extension: PROM;5 reps Digit Composite Flexion: PROM;AROM;5 reps Composite Extension: PROM;AROM;5 reps General Exercises - Lower Extremity Ankle Circles/Pumps: PROM;Right;10 reps;Supine Heel Slides: PROM;Right;10 reps;Supine Hip ABduction/ADduction: PROM;Right;10 reps;Supine           Pertinent Vitals/ Pain       Pain Assessment: No/denies pain  Home Living Family/patient expects to be discharged to:: Inpatient rehab Living Arrangements: Spouse/significant other                                      Prior Functioning/Environment Level of Independence: Independent with assistive device(s)            Frequency Min 2X/week      End of Session     Activity Tolerance Patient tolerated treatment well   Patient Left in bed;with call bell/phone within reach;with bed alarm set           Time: 1545-1620 OT Time Calculation (min): 35 min  Charges: OT General Charges $OT Visit: 1 Procedure OT Treatments $Self Care/Home Management : 8-22 mins $Therapeutic Exercise: 8-22 mins  Guadelupe Sabin, OTR/L  [phone]  05/17/2015, 5:03 PM

## 2015-05-17 NOTE — Progress Notes (Signed)
Physical Therapy Treatment Patient Details Name: [name] MRN: [id number] DOB: Jun 24, 1940 Today's Date: 05/17/2015    History of Present Illness [name] is a 74 y.o. male with past medical history of diabetes mellitus type 2, COPD and history of MI came into the hospital with a left facial droop and right-sided weakness. Patient admitted recently to the hospital from 12/22 till the 24th, he was admitted for similar symptoms which resolved while is in the hospital, that given the MRI showed pontine stroke but patient was able to communicate, walk and swallow without problems. He was discharged home on aspirin. On Monday patient has had worsening of right-sided weakness so he came in to the ED, and evaluated by CT scan, he was sent home to follow-up with neurology and outpatient PT. patient developed facial droop when he woke up from sleep this morning so his wife brought him to the hospital for further evaluation.     PT Comments    Pt was found supine in bed awake but extremely drowsy.  He had difficulty opening his eyes.  He agreed to participate in therapeutic bed exercise.  There continues to be no active motion elicited in the RLE with various facilitation techniques however the neurologist documents 2/5 strength.  Pt was perhaps too drowsy to fully  Capture this motion.  He does demonstrate min active motion in all major muscle groups of the RUE.  He was too tired to try to participate in any mobility.  Follow Up Recommendations  SNF     Equipment Recommendations  None recommended by PT    Recommendations for Other Services OT consult     Precautions / Restrictions Precautions Precautions: Fall Restrictions Weight Bearing Restrictions: No    Mobility  Bed Mobility                  Transfers                    Ambulation/Gait                 Stairs            Wheelchair Mobility    Modified Rankin (Stroke Patients Only)        Balance                                    Cognition Arousal/Alertness: Lethargic Behavior During Therapy: Flat affect Overall Cognitive Status: Within Functional Limits for tasks assessed                      Exercises General Exercises - Upper Extremity Shoulder Flexion: AAROM;Right;5 reps;Supine Shoulder ABduction: AAROM;Right;5 reps;Supine Shoulder ADduction: AAROM;Right;5 reps;Supine Elbow Flexion: AAROM;Right;5 reps;Supine Elbow Extension: AAROM;Right;5 reps;Supine Digit Composite Flexion: AAROM;Right;5 reps;Supine General Exercises - Lower Extremity Ankle Circles/Pumps: PROM;Right;10 reps;Supine Heel Slides: PROM;Right;10 reps;Supine Hip ABduction/ADduction: PROM;Right;10 reps;Supine    General Comments        Pertinent Vitals/Pain Pain Assessment: No/denies pain    Home Living                      Prior Function            PT Goals (current goals can now be found in the care plan section) Progress towards PT goals: Not progressing toward goals - comment (very drowsy)    Frequency  7X/week  PT Plan Current plan remains appropriate    Co-evaluation             End of Session   Activity Tolerance: Patient limited by fatigue Patient left: in bed;with call bell/phone within reach;with bed alarm set     Time: [id number] PT Time Calculation (min) (ACUTE ONLY): 19 min  Charges:  $Therapeutic Exercise: 8-22 mins                    G CodesSable Feil  PT 05/17/2015, 3:40 PM [phone]

## 2015-05-18 ENCOUNTER — Ambulatory Visit (HOSPITAL_COMMUNITY)
Admission: RE | Admit: 2015-05-18 | Discharge: 2015-05-18 | Disposition: A | Discharge status: Home / Self Care | Payer: Commercial Managed Care - HMO | Source: Ambulatory Visit | Attending: Internal Medicine | Admitting: Internal Medicine

## 2015-05-18 ENCOUNTER — Other Ambulatory Visit: Payer: Self-pay | Admitting: Internal Medicine

## 2015-05-18 ENCOUNTER — Inpatient Hospital Stay
Admission: RE | Admit: 2015-05-18 | Discharge: 2015-06-04 | Disposition: A | Discharge status: Nursing Home (Includes ICF) | Payer: Commercial Managed Care - HMO | Source: Ambulatory Visit | Attending: Internal Medicine | Admitting: Internal Medicine

## 2015-05-18 DIAGNOSIS — Z87891 Personal history of nicotine dependence: Secondary | ICD-10-CM | POA: Diagnosis not present

## 2015-05-18 DIAGNOSIS — M5489 Other dorsalgia: Secondary | ICD-10-CM | POA: Diagnosis not present

## 2015-05-18 DIAGNOSIS — Z79899 Other long term (current) drug therapy: Secondary | ICD-10-CM | POA: Diagnosis not present

## 2015-05-18 DIAGNOSIS — M6289 Other specified disorders of muscle: Secondary | ICD-10-CM | POA: Diagnosis not present

## 2015-05-18 DIAGNOSIS — E109 Type 1 diabetes mellitus without complications: Secondary | ICD-10-CM | POA: Diagnosis not present

## 2015-05-18 DIAGNOSIS — G8191 Hemiplegia, unspecified affecting right dominant side: Secondary | ICD-10-CM | POA: Diagnosis not present

## 2015-05-18 DIAGNOSIS — R799 Abnormal finding of blood chemistry, unspecified: Secondary | ICD-10-CM | POA: Diagnosis present

## 2015-05-18 DIAGNOSIS — I69392 Facial weakness following cerebral infarction: Secondary | ICD-10-CM | POA: Diagnosis not present

## 2015-05-18 DIAGNOSIS — E1059 Type 1 diabetes mellitus with other circulatory complications: Secondary | ICD-10-CM | POA: Diagnosis not present

## 2015-05-18 DIAGNOSIS — I10 Essential (primary) hypertension: Secondary | ICD-10-CM | POA: Diagnosis not present

## 2015-05-18 DIAGNOSIS — M1712 Unilateral primary osteoarthritis, left knee: Secondary | ICD-10-CM | POA: Diagnosis not present

## 2015-05-18 DIAGNOSIS — K5900 Constipation, unspecified: Secondary | ICD-10-CM | POA: Insufficient documentation

## 2015-05-18 DIAGNOSIS — E669 Obesity, unspecified: Secondary | ICD-10-CM | POA: Diagnosis not present

## 2015-05-18 DIAGNOSIS — Z872 Personal history of diseases of the skin and subcutaneous tissue: Secondary | ICD-10-CM | POA: Diagnosis not present

## 2015-05-18 DIAGNOSIS — G8929 Other chronic pain: Principal | ICD-10-CM

## 2015-05-18 DIAGNOSIS — E785 Hyperlipidemia, unspecified: Secondary | ICD-10-CM | POA: Diagnosis not present

## 2015-05-18 DIAGNOSIS — Z9181 History of falling: Secondary | ICD-10-CM

## 2015-05-18 DIAGNOSIS — M109 Gout, unspecified: Secondary | ICD-10-CM | POA: Diagnosis not present

## 2015-05-18 DIAGNOSIS — Z8701 Personal history of pneumonia (recurrent): Secondary | ICD-10-CM | POA: Diagnosis not present

## 2015-05-18 DIAGNOSIS — I2510 Atherosclerotic heart disease of native coronary artery without angina pectoris: Secondary | ICD-10-CM | POA: Diagnosis not present

## 2015-05-18 DIAGNOSIS — K219 Gastro-esophageal reflux disease without esophagitis: Secondary | ICD-10-CM | POA: Diagnosis not present

## 2015-05-18 DIAGNOSIS — W19XXXA Unspecified fall, initial encounter: Secondary | ICD-10-CM

## 2015-05-18 DIAGNOSIS — E1121 Type 2 diabetes mellitus with diabetic nephropathy: Secondary | ICD-10-CM | POA: Diagnosis not present

## 2015-05-18 DIAGNOSIS — R471 Dysarthria and anarthria: Secondary | ICD-10-CM | POA: Diagnosis not present

## 2015-05-18 DIAGNOSIS — R29810 Facial weakness: Secondary | ICD-10-CM | POA: Diagnosis not present

## 2015-05-18 DIAGNOSIS — Z9981 Dependence on supplemental oxygen: Secondary | ICD-10-CM | POA: Diagnosis not present

## 2015-05-18 DIAGNOSIS — I252 Old myocardial infarction: Secondary | ICD-10-CM | POA: Diagnosis not present

## 2015-05-18 DIAGNOSIS — N182 Chronic kidney disease, stage 2 (mild): Secondary | ICD-10-CM | POA: Diagnosis not present

## 2015-05-18 DIAGNOSIS — Z7982 Long term (current) use of aspirin: Secondary | ICD-10-CM | POA: Diagnosis not present

## 2015-05-18 DIAGNOSIS — J441 Chronic obstructive pulmonary disease with (acute) exacerbation: Secondary | ICD-10-CM | POA: Diagnosis not present

## 2015-05-18 DIAGNOSIS — N179 Acute kidney failure, unspecified: Secondary | ICD-10-CM | POA: Diagnosis not present

## 2015-05-18 DIAGNOSIS — S299XXA Unspecified injury of thorax, initial encounter: Secondary | ICD-10-CM | POA: Diagnosis not present

## 2015-05-18 DIAGNOSIS — M6281 Muscle weakness (generalized): Secondary | ICD-10-CM | POA: Diagnosis not present

## 2015-05-18 DIAGNOSIS — E875 Hyperkalemia: Secondary | ICD-10-CM | POA: Diagnosis not present

## 2015-05-18 DIAGNOSIS — I69351 Hemiplegia and hemiparesis following cerebral infarction affecting right dominant side: Secondary | ICD-10-CM | POA: Diagnosis not present

## 2015-05-18 DIAGNOSIS — M546 Pain in thoracic spine: Secondary | ICD-10-CM | POA: Diagnosis not present

## 2015-05-18 DIAGNOSIS — R569 Unspecified convulsions: Secondary | ICD-10-CM | POA: Diagnosis not present

## 2015-05-18 DIAGNOSIS — M545 Low back pain: Secondary | ICD-10-CM | POA: Diagnosis not present

## 2015-05-18 DIAGNOSIS — M25552 Pain in left hip: Secondary | ICD-10-CM | POA: Diagnosis not present

## 2015-05-18 DIAGNOSIS — Z9861 Coronary angioplasty status: Secondary | ICD-10-CM | POA: Diagnosis not present

## 2015-05-18 DIAGNOSIS — Z7902 Long term (current) use of antithrombotics/antiplatelets: Secondary | ICD-10-CM | POA: Diagnosis not present

## 2015-05-18 DIAGNOSIS — E538 Deficiency of other specified B group vitamins: Secondary | ICD-10-CM | POA: Diagnosis not present

## 2015-05-18 DIAGNOSIS — M1380 Other specified arthritis, unspecified site: Secondary | ICD-10-CM | POA: Diagnosis not present

## 2015-05-18 DIAGNOSIS — S3992XA Unspecified injury of lower back, initial encounter: Secondary | ICD-10-CM | POA: Diagnosis not present

## 2015-05-18 DIAGNOSIS — R278 Other lack of coordination: Secondary | ICD-10-CM | POA: Diagnosis not present

## 2015-05-18 DIAGNOSIS — M25551 Pain in right hip: Secondary | ICD-10-CM | POA: Diagnosis not present

## 2015-05-18 DIAGNOSIS — S2231XA Fracture of one rib, right side, initial encounter for closed fracture: Secondary | ICD-10-CM | POA: Diagnosis not present

## 2015-05-18 DIAGNOSIS — I6350 Cerebral infarction due to unspecified occlusion or stenosis of unspecified cerebral artery: Secondary | ICD-10-CM | POA: Diagnosis not present

## 2015-05-18 DIAGNOSIS — I639 Cerebral infarction, unspecified: Secondary | ICD-10-CM | POA: Diagnosis not present

## 2015-05-18 DIAGNOSIS — R1312 Dysphagia, oropharyngeal phase: Secondary | ICD-10-CM | POA: Diagnosis not present

## 2015-05-18 DIAGNOSIS — J449 Chronic obstructive pulmonary disease, unspecified: Secondary | ICD-10-CM | POA: Diagnosis not present

## 2015-05-18 LAB — GLUCOSE, CAPILLARY
GLUCOSE-CAPILLARY: 138 mg/dL — AB (ref 65–99)
Glucose-Capillary: 131 mg/dL — ABNORMAL HIGH (ref 65–99)
Glucose-Capillary: 117 mg/dL — ABNORMAL HIGH (ref 65–99)

## 2015-05-18 LAB — ACETYLCHOLINE RECEPTOR AB, ALL
ACETYLCHOL BLOCK AB: 22 % (ref 0–25)
Acetylcholine Modulat Ab: <12 % (ref 0–20)

## 2015-05-18 MED ORDER — METOPROLOL TARTRATE 50 MG PO TABS: 25.0000 mg | ORAL_TABLET | Freq: Two times a day (BID) | ORAL | Status: DC

## 2015-05-18 MED ORDER — CLOPIDOGREL BISULFATE 75 MG PO TABS: 75.0000 mg | ORAL_TABLET | Freq: Every day | ORAL | Status: DC

## 2015-05-18 MED ORDER — RESOURCE THICKENUP CLEAR PO POWD
ORAL | Status: DC
Start: 2015-05-18 — End: 2015-06-26

## 2015-05-18 MED ORDER — PRAVASTATIN SODIUM 40 MG PO TABS: 80.0000 mg | ORAL_TABLET | Freq: Every day | ORAL | Status: DC

## 2015-05-18 NOTE — Progress Notes (Signed)
Pt's IV catheter removed and intact. Pt's IV site clean dry and intact.  Pt in stable condition and in no acute distress.  Report called and given to Felicita Gage, [facility]. All questions were answered and no further questions at this time.

## 2015-05-18 NOTE — Care Management Important Message (Signed)
Important Message  Patient Details  Name: [name] MRN: [id number] Date of Birth: 1940/08/29   Medicare Important Message Given:  Yes    Joylene Draft, RN 05/18/2015, 11:06 AM

## 2015-05-18 NOTE — Progress Notes (Addendum)
Rehab Admissions Coordinator Note:  Patient was screened by Cleatrice Burke for appropriateness for an Inpatient Acute Rehab Consult per OT change in recommendation on 12/29 at 1704 for possible inpt rehab admission. At this time, we are recommending SNF. As pt is inconsistent in his functional tolerance with therapy this week, I am unable to demonstrate with [facility] Medicare his ability to tolerate intensity of inpt acute rehab at 3 hrs per day. I will alert RN CM and SW of my recommendation.   Cleatrice Burke 05/18/2015, 10:40 AM  I can be reached at [phone]

## 2015-05-18 NOTE — Discharge Summary (Signed)
Physician Discharge Summary  [name] [id number] DOB: [birth year] DOA: 05/15/2015  PCP: Tula Nakayama, MD  Admit date: 05/15/2015 Discharge date: 05/18/2015  Recommendations for Outpatient Follow-up:  1. Follow up CVA with neurology in 1-2 months. 2. Continue dual-antiplatelets for 3 months, then Plavix.  3. SNF for PT,OT, and ST.  4. CBG stable, without long acting insulin. Continue as outpatient. Will need frequent CBG checks. 5. Lubricate left eye each night and as needed. Left eye care to prevent dry eye given facial weakness.    Follow-up Information    Follow up with [facility] .   Specialty:  Skilled Nursing Facility   Contact information:   [address] [phone]      Follow up with [facility], Trey Sailors, MD. Schedule an appointment as soon as possible for a visit in 1 month.   Specialty:  Neurology   Contact information:   [address] [place] [address] [phone]       Follow up with Tula Nakayama, MD.   Specialty:  Family Medicine   Why:  As needed   Contact information:   [address] [place] [address] [phone]      Discharge Diagnoses:  1. Left paracentral pontine stroke with RUE/RLE flaccidity, facial droop, and dysarthria.  2. Dysarthia, dysphagia  3. Suspected partial seizures on last admission. 4. DM type 2. 5. Hypothyroidism. 6. Dyslipidemia. 7. Coronary atherosclerosis. 8. Vitamin B12 deficiency. 9. Chronic leukocytosis.  10. Normocytic anemia.  Discharge Condition: Stable Disposition: SNF  Diet recommendation: dysphagia 1 (Puree);Honey-thick liquid Medication Administration: Crushed with puree Supervision: Staff to assist with self feeding;Full supervision/cueing for compensatory strategies Compensations: Minimize environmental distractions;Slow rate;Small sips/bites;Lingual sweep for clearance of pocketing;Multiple dry swallows after  each bite/sip Postural Changes and/or Swallow Maneuvers: Seated upright 90 degrees;Upright 30-60 min after meal  Filed Weights   05/15/15 1148 05/15/15 1815 05/16/15 0500  Weight: 106.142 kg (234 lb) 104 kg (229 lb 4.5 oz) 103.7 kg (228 lb 9.9 oz)    History of present illness:  74 yom PMHx of diabetes mellitus type 2, COPD and history of MI came into the hospital with a left facial droop and right-sided weakness. Patient admitted recently to the hospital from 12/22 until the 24th, he was admitted for similar symptoms which resolved while he was in the hospital. At that time, MRI showed pontine stroke but patient was able to communicate, walk and swallow without problems. He was discharged home on aspirin. On 12/26, patient had worsening of right-sided weakness so he came in to the ED.73 CT was negative so he was sent home to follow-up with neurology and outpatient PT. Patient developed facial droop when he woke up from sleep on 12/27 so his wife brought him to the hospital for further evaluation. In the ED patient had complete right-sided flaccidity, facial droop and dysarthria, MRI of the brain was repeated and showed dorsal extension of the left paracentral pontine stroke.  Hospital Course:  Patient presented with left sided facial droop, dysarthria, and right sided flaccidity. Found to have a dorsal extension of left paracentral pontine stroke found on MRI 12/22. Labs from admission on 12/24 revealed a LDL of 78 and Hgb A1c of 7.1. Neurology consulted and recommended starting dual antiplatelets for 3 months and subsequent Plavix. OT and PT consulted and recommended patient be discharged to SNF for further therapy. Dysarthria and dysphagia remained unchanged. ST consulted and recommended patient be started  on a dysphagia 1 (Puree) diet with honey-thick liquid. He is tolerating this well. All other issues remained stable.   Individual issues as below:  1. Left paracentral pontine stroke with RUE/RLE  flaccidity, facial droop, and dysarthria. Some improvement in strength. LDL 78. Hgb A1C 7.1. Neurology consulted and recommends dual antiplatelets for 3 months and then Plavix. PT and OT recommend SNF.  2. Dysarthia, dysphagia, speech therapy consulted and recommends dysphagia 1 (Puree);Honey-thick liquid . Tolerating diet.  3. Suspected partial seizures on last admission, started on Keppra during last admission. Continue Keppra, no evidence of seizures. 4. DM type 2, stable. Continue SSI 5. Hypothyroidism, continue Synthroid 6. Dyslipidemia, continue statin.  7. Coronary atherosclerosis. 8. Vitamin B12 deficiency. 9. Chronic leukocytosis, unclear etiology. F/u as an outpatient.  10. Normocytic anemia, chronic, stable. 11. Constipation, will start bowel regimen.  Consultants:  Neurology  PT- SNF.   OT- SNF  ST- Dysphagia 1 (Puree);Honey-thick liquid  Procedures:  none  Antibiotics:  none  Discharge Instructions     Current Discharge Medication List    START taking these medications   Details  clopidogrel (PLAVIX) 75 MG tablet Take 1 tablet (75 mg total) by mouth daily.    Maltodextrin-Xanthan Gum (Chandler) POWD As needed      CONTINUE these medications which have CHANGED   Details  metoprolol (LOPRESSOR) 50 MG tablet Take 0.5 tablets (25 mg total) by mouth 2 (two) times daily.   Associated Diagnoses: Atherosclerosis of native coronary artery of native heart without angina pectoris    pravastatin (PRAVACHOL) 40 MG tablet Take 2 tablets (80 mg total) by mouth daily.      CONTINUE these medications which have NOT CHANGED   Details  acetaminophen (TYLENOL) 500 MG tablet Take 500 mg by mouth every 6 (six) hours as needed for moderate pain (left thigh).    albuterol (PROVENTIL HFA;VENTOLIN HFA) 108 (90 BASE) MCG/ACT inhaler Inhale 1-2 puffs into the lungs every 6 (six) hours as needed for wheezing or shortness of breath. Qty: 1 Inhaler,  Refills: 2    allopurinol (ZYLOPRIM) 300 MG tablet Take 300 mg by mouth daily.    amLODipine (NORVASC) 10 MG tablet Take 1 tablet (10 mg total) by mouth daily. Qty: 90 tablet, Refills: 1    aspirin 325 MG tablet Take 1 tablet (325 mg total) by mouth daily.    levETIRAcetam (KEPPRA) 500 MG tablet Take 1 tablet (500 mg total) by mouth 2 (two) times daily. Qty: 60 tablet, Refills: 0    levothyroxine (SYNTHROID, LEVOTHROID) 75 MCG tablet Take 75 mcg by mouth every morning.     metFORMIN (GLUCOPHAGE) 500 MG tablet Take 1 tablet (500 mg total) by mouth 2 (two) times daily. Qty: 60 tablet, Refills: 3    spironolactone (ALDACTONE) 25 MG tablet Take 1 tablet (25 mg total) by mouth daily. Qty: 90 tablet, Refills: 3   Associated Diagnoses: Atherosclerosis of native coronary artery of native heart without angina pectoris    vitamin B-12 (CYANOCOBALAMIN) 1000 MCG tablet Take 1,000 mcg by mouth daily.    pantoprazole (PROTONIX) 40 MG tablet Take 1 tablet (40 mg total) by mouth daily before breakfast. Qty: 30 tablet, Refills: 1      STOP taking these medications     insulin NPH-regular Human (NOVOLIN 70/30) (70-30) 100 UNIT/ML injection        No Known Allergies  The results of significant diagnostics from this hospitalization (including imaging, microbiology, ancillary and laboratory) are listed below for  reference.    Significant Diagnostic Studies: Ct Angio Head W/cm &/or Wo Cm  05/15/2015  CLINICAL DATA:  Worsening stroke symptoms with aphasia and right-sided weakness. Recent discharge for same. EXAM: CT ANGIOGRAPHY HEAD AND NECK TECHNIQUE: Multidetector CT imaging of the head and neck was performed using the standard protocol during bolus administration of intravenous contrast. Multiplanar CT image reconstructions and MIPs were obtained to evaluate the vascular anatomy. Carotid stenosis measurements (when applicable) are obtained utilizing NASCET criteria, using the distal internal  carotid diameter as the denominator. CONTRAST:  2mL OMNIPAQUE IOHEXOL 350 MG/ML SOLN COMPARISON:  None. FINDINGS: CT HEAD Calvarium and skull base: No fracture or destructive lesion. Mastoids and middle ears are grossly clear. Paranasal sinuses: Imaged portions are clear. Orbits: Negative. Brain: LEFT paramedian pontine infarction is much easier to visualize on MR than CT. No evidence for hemorrhagic transformation of such. Mild atrophy consistent with patient's age. Hypoattenuation of white matter, representing small vessel disease. Chronic BILATERAL thalamic and deep white matter lacunar infarcts. CTA NECK Aortic arch: Common origin to the innominate and LEFT common carotid artery. Transverse arch calcification and mural thrombus. Moderately heavy calcific plaque at the origin LEFT subclavian but non stenotic. Imaged portion shows no evidence of aneurysm or dissection. No significant stenosis of the major arch vessel origins. Right carotid system: Only mild non stenotic calcification at the RIGHT ICA origin. Extreme dolichoectasia with non stenotic cervical loop. Eccentric cervical ICA calcification at also non stenotic No evidence of dissection, stenosis (50% or greater) or occlusion. Left carotid system: Mildly chunky, but non stenotic, LEFT ICA calcific plaque. No measurable stenosis. Dolichoectatic cervical ICA with eccentric calcifications, also above stenotic. No evidence of dissection, stenosis (50% or greater) or occlusion. Vertebral arteries: LEFT vertebral may be slightly larger. No evidence of dissection, or occlusion. RIGHT vertebral origin stenosis estimated 50%. Both vessels are continuous through the neck. Nonvascular soft tissues: No lung apex nodule. Mild apical pleural thickening is greater on the RIGHT. Advanced cervical spondylosis. No sinus or mastoid disease of significance. Negative orbits. CTA HEAD Anterior circulation: Moderately advanced calcific plaque in the BILATERAL cavernous ICAs  estimated 50-75%. This is borderline flow reducing. No proximal occlusion, aneurysm, or vascular malformation. No significant A1, M1, or medium-sized MCA branch stenosis or occlusion. Posterior circulation: Mild BILATERAL eccentric calcification of the distal vertebral artery V4 segments without focal stenosis. Mild non stenotic irregularity of the basilar artery in its midportion with some small areas of eccentric calcific plaque. No significant stenosis, proximal occlusion, aneurysm, or vascular malformation. No significant PCA disease. Venous sinuses: As permitted by contrast timing, patent. Anatomic variants: None of significance. Delayed phase:   No abnormal intracranial enhancement. IMPRESSION: Moderately advanced calcific plaque in the BILATERAL cavernous ICAs, estimated 50-75%. Otherwise no flow reducing anterior circulation disease. Minor atheromatous change of the distal vertebral arteries and mid basilar artery, without flow reducing lesion or dissection to account for the observed acute LEFT pontine infarct. Difficult to visualize LEFT pontine infarct on CT. No visible hemorrhagic transformation. No abnormal postcontrast enhancement. Electronically Signed   By: Staci Righter M.D.   On: 05/15/2015 15:00   Dg Chest 2 View  04/23/2015  CLINICAL DATA:  74 year old male with acute agitation, aggressive behavior. Recent falls. Initial encounter. EXAM: CHEST  2 VIEW COMPARISON:  04/18/2015 and earlier. FINDINGS: Stable cardiac size and mediastinal contours. Cardiac size at the upper limits of normal. Visualized tracheal air column is within normal limits. Mild apical and peripheral scarring in the upper lobes  appear stable and similar to that on chest CTA 05/09/2013. No pneumothorax, pulmonary edema, pleural effusion or acute pulmonary opacity. No acute osseous abnormality identified. IMPRESSION: No acute cardiopulmonary abnormality. Electronically Signed   By: Genevie [name] M.D.   On: 04/23/2015 08:14     Ct  Head Wo Contrast  05/15/2015  CLINICAL DATA:  Code stroke. Stroke-like symptoms. Speaking in full sentences. EXAM: CT HEAD WITHOUT CONTRAST TECHNIQUE: Contiguous axial images were obtained from the base of the skull through the vertex without intravenous contrast. COMPARISON:  05/14/2015 FINDINGS: There is no evidence of mass effect, midline shift, or extra-axial fluid collections. There is no evidence of a space-occupying lesion or intracranial hemorrhage. There is no evidence of a cortical-based area of acute infarction. Recently demonstrated left pontine infarct on MRI dated 05/10/2015 is not well appreciated on the current exam. There is generalized cerebral atrophy. There is periventricular white matter low attenuation likely secondary to microangiopathy. The ventricles and sulci are appropriate for the patient's age. The basal cisterns are patent. Visualized portions of the orbits are unremarkable. The visualized portions of the paranasal sinuses and mastoid air cells are unremarkable. Cerebrovascular atherosclerotic calcifications are noted. The osseous structures are unremarkable. IMPRESSION: 1. No acute intracranial pathology. 2. Subacute left pontine infarct is not well demonstrated on the current examination. No hemorrhagic transformation. Electronically Signed   By: Kathreen Devoid   On: 05/15/2015 12:24   Ct Head Wo Contrast  05/14/2015  CLINICAL DATA:  Recent history of a stroke, continue right-sided weakness. EXAM: CT HEAD WITHOUT CONTRAST TECHNIQUE: Contiguous axial images were obtained from the base of the skull through the vertex without intravenous contrast. COMPARISON:  May 10, 2015 CT head, MR brain May 10, 2015 FINDINGS: The previous MR noted left pontine acute infarct is not as well appreciated on the CT scan. There is no hemorrhagic transformation. There is no midline shift, hydrocephalus. There is old lacune infarction of the left basal ganglia, unchanged. No new infarcts are  identified. The bony calvarium is intact. The visualized sinuses are clear. IMPRESSION: Previous MRI dated left pontine acute infarct is not as well appreciated on CT scan. There is no hemorrhagic transformation. No new infarcts are identified. Electronically Signed   By: Abelardo Diesel M.D.   On: 05/14/2015 08:31   Ct Head Wo Contrast  05/10/2015  CLINICAL DATA:  74 year old male with right-sided weakness since started yesterday. EXAM: CT HEAD WITHOUT CONTRAST TECHNIQUE: Contiguous axial images were obtained from the base of the skull through the vertex without intravenous contrast. COMPARISON:  Head CT dated 06/23/2014 FINDINGS: There is slight prominence of the ventricles and sulci compatible with age-related volume loss. Mild periventricular and deep white matter hypodensities represent chronic microvascular ischemic changes. Small left basal ganglia old lacunar infarct. There is no intracranial hemorrhage. No mass effect or midline shift identified. The visualized paranasal sinuses and mastoid air cells are well aerated. The calvarium is intact. IMPRESSION: No acute intracranial hemorrhage. Mild age-related atrophy and chronic microvascular ischemic disease. If symptoms persist and there are no contraindications, MRI may provide better evaluation if clinically indicated Electronically Signed   By: Anner Crete M.D.   On: 05/10/2015 01:12       Ct Angio Neck W/cm &/or Wo/cm  05/15/2015  CLINICAL DATA:  Worsening stroke symptoms with aphasia and right-sided weakness. Recent discharge for same. EXAM: CT ANGIOGRAPHY HEAD AND NECK TECHNIQUE: Multidetector CT imaging of the head and neck was performed using the standard protocol during bolus administration  of intravenous contrast. Multiplanar CT image reconstructions and MIPs were obtained to evaluate the vascular anatomy. Carotid stenosis measurements (when applicable) are obtained utilizing NASCET criteria, using the distal internal carotid diameter as  the denominator. CONTRAST:  4mL OMNIPAQUE IOHEXOL 350 MG/ML SOLN COMPARISON:  None. FINDINGS: CT HEAD Calvarium and skull base: No fracture or destructive lesion. Mastoids and middle ears are grossly clear. Paranasal sinuses: Imaged portions are clear. Orbits: Negative. Brain: LEFT paramedian pontine infarction is much easier to visualize on MR than CT. No evidence for hemorrhagic transformation of such. Mild atrophy consistent with patient's age. Hypoattenuation of white matter, representing small vessel disease. Chronic BILATERAL thalamic and deep white matter lacunar infarcts. CTA NECK Aortic arch: Common origin to the innominate and LEFT common carotid artery. Transverse arch calcification and mural thrombus. Moderately heavy calcific plaque at the origin LEFT subclavian but non stenotic. Imaged portion shows no evidence of aneurysm or dissection. No significant stenosis of the major arch vessel origins. Right carotid system: Only mild non stenotic calcification at the RIGHT ICA origin. Extreme dolichoectasia with non stenotic cervical loop. Eccentric cervical ICA calcification at also non stenotic No evidence of dissection, stenosis (50% or greater) or occlusion. Left carotid system: Mildly chunky, but non stenotic, LEFT ICA calcific plaque. No measurable stenosis. Dolichoectatic cervical ICA with eccentric calcifications, also above stenotic. No evidence of dissection, stenosis (50% or greater) or occlusion. Vertebral arteries: LEFT vertebral may be slightly larger. No evidence of dissection, or occlusion. RIGHT vertebral origin stenosis estimated 50%. Both vessels are continuous through the neck. Nonvascular soft tissues: No lung apex nodule. Mild apical pleural thickening is greater on the RIGHT. Advanced cervical spondylosis. No sinus or mastoid disease of significance. Negative orbits. CTA HEAD Anterior circulation: Moderately advanced calcific plaque in the BILATERAL cavernous ICAs estimated 50-75%. This  is borderline flow reducing. No proximal occlusion, aneurysm, or vascular malformation. No significant A1, M1, or medium-sized MCA branch stenosis or occlusion. Posterior circulation: Mild BILATERAL eccentric calcification of the distal vertebral artery V4 segments without focal stenosis. Mild non stenotic irregularity of the basilar artery in its midportion with some small areas of eccentric calcific plaque. No significant stenosis, proximal occlusion, aneurysm, or vascular malformation. No significant PCA disease. Venous sinuses: As permitted by contrast timing, patent. Anatomic variants: None of significance. Delayed phase:   No abnormal intracranial enhancement. IMPRESSION: Moderately advanced calcific plaque in the BILATERAL cavernous ICAs, estimated 50-75%. Otherwise no flow reducing anterior circulation disease. Minor atheromatous change of the distal vertebral arteries and mid basilar artery, without flow reducing lesion or dissection to account for the observed acute LEFT pontine infarct. Difficult to visualize LEFT pontine infarct on CT. No visible hemorrhagic transformation. No abnormal postcontrast enhancement. Electronically Signed   By: Staci Righter M.D.   On: 05/15/2015 15:00   Ct Cervical Spine Wo Contrast  04/20/2015  CLINICAL DATA:  [place] tonight. Amnestic for the event. Now with left-sided head and neck pain. EXAM: CT HEAD WITHOUT CONTRAST CT CERVICAL SPINE WITHOUT CONTRAST TECHNIQUE: Multidetector CT imaging of the head and cervical spine was performed following the standard protocol without intravenous contrast. Multiplanar CT image reconstructions of the cervical spine were also generated. COMPARISON:  10/03/2009 FINDINGS: CT HEAD FINDINGS There is no intracranial hemorrhage or extra-axial fluid collection there is moderate generalized atrophy. There is remote lacunar infarction in the right thalamus and left subinsular region. Mild white matter hypodensity is consistent with chronic small  vessel disease. No acute intracranial findings are evident, and there is  no significant interval change. Calvarium and skullbase are intact. CT CERVICAL SPINE FINDINGS The vertebral column, pedicles and facet articulations are intact. There is no evidence of acute fracture. No acute soft tissue abnormalities are evident. Moderate degenerative disc changes are present from C3 through C6. IMPRESSION: 1. Negative for acute intracranial traumatic injury. Remote lacunar infarctions, mild chronic small vessel disease and moderate generalized atrophy are present. 2. Negative for acute cervical spine fracture. Electronically Signed   By: Andreas Newport M.D.   On: 04/20/2015 21:26   Mr [name] Head Wo Contrast  05/10/2015  CLINICAL DATA:  74 year old male with right side weakness for 2 days. Initial encounter. EXAM: MRI HEAD WITHOUT CONTRAST MRA HEAD WITHOUT CONTRAST TECHNIQUE: Multiplanar, multiecho pulse sequences of the brain and surrounding structures were obtained without intravenous contrast. Angiographic images of the head were obtained using MRA technique without contrast. COMPARISON:  Noncontrast head CT 0026 hours today. FINDINGS: MRI HEAD FINDINGS Linear 10 mm area of restricted diffusion in the left paracentral pons (series 100, image 12) associated with T2 and FLAIR hyperintensity. No associated hemorrhage or mass effect. Major intracranial vascular flow voids are preserved, with generalized intracranial artery dolichoectasia. No other restricted diffusion. Chronic lacunar infarcts in the bilateral thalami. Chronic lacunar infarct in the left external capsule. No cortical encephalomalacia. Comparatively mild patchy mostly periatrial cerebral white matter T2 and FLAIR hyperintensity. No chronic cerebral blood products identified. No midline shift, mass effect, evidence of mass lesion, ventriculomegaly, extra-axial collection or acute intracranial hemorrhage. Cervicomedullary junction and pituitary are within  normal limits. Negative visualized cervical spine. Visible internal auditory structures appear normal. Paranasal sinuses and mastoids are clear. Negative orbit and scalp soft tissues. MRA HEAD FINDINGS Antegrade flow in both distal vertebral arteries which appear codominant. Irregularity but no distal vertebral artery stenosis. Both PICA origins remain patent. Patent vertebrobasilar junction. Basilar irregularity but no stenosis. SCA, AICA, SCA, and PCA origins are patent. Posterior communicating arteries are diminutive or absent. There is mild irregularity in the distal PCA P2 segments with preserved distal flow. Antegrade flow in both ICA siphons with irregularity maximal in the distal cavernous segments and anterior genu regions greater on the left. Up to moderate left anterior genu stenosis. Still, both carotid termini remain patent. Bilateral MCA and ACA origins are within normal limits. Tortuous A1 and M1 segments. Anterior communicating artery and visualized ACA branches are within normal limits, tortuous A2 segments. MCA M1 segments and bifurcations are patent with no stenosis identified. Visualized bilateral MCA branches are within normal limits. IMPRESSION: 1. Acute lacunar infarct in the left paracentral pons, basilar artery perforator territory. No associated hemorrhage or mass effect. 2. Chronic small vessel ischemia in the deep gray matter nuclei, primarily the thalamostriate artery territory. 3. Intracranial artery dolichoectasia. No posterior circulation stenosis. Moderate stenosis of the left ICA siphon. No major circle of Willis branch occlusion. Electronically Signed   By: Genevie [name] M.D.   On: 05/10/2015 12:12   Mr Brain Wo Contrast  05/15/2015  CLINICAL DATA:  74 year old male with right side weakness, slurred speech. Recent Left brainstem infarct on the 22nd. Initial encounter. EXAM: MRI HEAD WITHOUT CONTRAST TECHNIQUE: Multiplanar, multiecho pulse sequences of the brain and surrounding  structures were obtained without intravenous contrast. COMPARISON:  Head CT without contrast 1212 hours today and earlier. Brain MRI 05/10/2015. FINDINGS: The left paracentral brainstem infarct has extended dorsally since 05/10/2015, but otherwise affects the same vascular territory as before. See series 1, image 10 and compared to series 100,  image 11 previously). Associated T2 and FLAIR hyperintensity. No associated acute hemorrhage or mass effect. Major intracranial vascular flow voids are stable. No new area of restricted diffusion. Elsewhere stable gray and white matter signal elsewhere including multifocal chronic thalamic and posterior white matter capsule lacunar infarcts. Chronic micro hemorrhage in the right paracentral pons. No intracranial mass effect. No ventriculomegaly, extra-axial collection or acute intracranial hemorrhage. Cervicomedullary junction and pituitary are within normal limits. Negative visualized cervical spine. Normal bone marrow signal. Visualized internal auditory structures, mastoids, paranasal sinuses, orbits soft tissues and scalp soft tissues are stable. IMPRESSION: 1. Dorsal extension of the left paracentral pontine infarct since the 22nd. No associated hemorrhage or mass effect. 2. No new intracranial abnormality. Electronically Signed   By: Genevie [name] M.D.   On: 05/15/2015 14:09   Mr Brain Wo Contrast  05/10/2015  CLINICAL DATA:  74 year old male with right side weakness for 2 days. Initial encounter. EXAM: MRI HEAD WITHOUT CONTRAST MRA HEAD WITHOUT CONTRAST TECHNIQUE: Multiplanar, multiecho pulse sequences of the brain and surrounding structures were obtained without intravenous contrast. Angiographic images of the head were obtained using MRA technique without contrast. COMPARISON:  Noncontrast head CT 0026 hours today. FINDINGS: MRI HEAD FINDINGS Linear 10 mm area of restricted diffusion in the left paracentral pons (series 100, image 12) associated with T2 and FLAIR  hyperintensity. No associated hemorrhage or mass effect. Major intracranial vascular flow voids are preserved, with generalized intracranial artery dolichoectasia. No other restricted diffusion. Chronic lacunar infarcts in the bilateral thalami. Chronic lacunar infarct in the left external capsule. No cortical encephalomalacia. Comparatively mild patchy mostly periatrial cerebral white matter T2 and FLAIR hyperintensity. No chronic cerebral blood products identified. No midline shift, mass effect, evidence of mass lesion, ventriculomegaly, extra-axial collection or acute intracranial hemorrhage. Cervicomedullary junction and pituitary are within normal limits. Negative visualized cervical spine. Visible internal auditory structures appear normal. Paranasal sinuses and mastoids are clear. Negative orbit and scalp soft tissues. MRA HEAD FINDINGS Antegrade flow in both distal vertebral arteries which appear codominant. Irregularity but no distal vertebral artery stenosis. Both PICA origins remain patent. Patent vertebrobasilar junction. Basilar irregularity but no stenosis. SCA, AICA, SCA, and PCA origins are patent. Posterior communicating arteries are diminutive or absent. There is mild irregularity in the distal PCA P2 segments with preserved distal flow. Antegrade flow in both ICA siphons with irregularity maximal in the distal cavernous segments and anterior genu regions greater on the left. Up to moderate left anterior genu stenosis. Still, both carotid termini remain patent. Bilateral MCA and ACA origins are within normal limits. Tortuous A1 and M1 segments. Anterior communicating artery and visualized ACA branches are within normal limits, tortuous A2 segments. MCA M1 segments and bifurcations are patent with no stenosis identified. Visualized bilateral MCA branches are within normal limits. IMPRESSION: 1. Acute lacunar infarct in the left paracentral pons, basilar artery perforator territory. No associated  hemorrhage or mass effect. 2. Chronic small vessel ischemia in the deep gray matter nuclei, primarily the thalamostriate artery territory. 3. Intracranial artery dolichoectasia. No posterior circulation stenosis. Moderate stenosis of the left ICA siphon. No major circle of Willis branch occlusion. Electronically Signed   By: Genevie [name] M.D.   On: 05/10/2015 12:12    US Carotid Bilateral  05/12/2015  CLINICAL DATA:  Acute left pontine infarct. EXAM: BILATERAL CAROTID DUPLEX ULTRASOUND TECHNIQUE: Pearline Cables scale imaging, color Doppler and duplex ultrasound were performed of bilateral carotid and vertebral arteries in the neck. COMPARISON:  05/09/2001 FINDINGS: Criteria: Quantification  of carotid stenosis is based on velocity parameters that correlate the residual internal carotid diameter with NASCET-based stenosis levels, using the diameter of the distal internal carotid lumen as the denominator for stenosis measurement. The following velocity measurements were obtained: RIGHT ICA:  103/27 cm/sec CCA:  [id number] cm/sec SYSTOLIC ICA/CCA RATIO:  [id number] DIASTOLIC ICA/CCA RATIO:  [id number] ECA:  204 cm/sec LEFT ICA:  98/25 cm/sec CCA:  [id number] cm/sec SYSTOLIC ICA/CCA RATIO:  [id number] DIASTOLIC ICA/CCA RATIO:  2.3 ECA:  107 cm/sec RIGHT CAROTID ARTERY: Minor echogenic shadowing plaque formation. No hemodynamically significant right ICA stenosis, velocity elevation, or turbulent flow. Degree of narrowing less than 50%. RIGHT VERTEBRAL ARTERY:  Antegrade LEFT CAROTID ARTERY: Similar scattered minor echogenic plaque formation. No hemodynamically significant left ICA stenosis, velocity elevation, or turbulent flow. LEFT VERTEBRAL ARTERY:  Antegrade IMPRESSION: Minor carotid atherosclerosis. No hemodynamically significant ICA stenosis. Degree of narrowing less than 50% bilaterally. Electronically Signed   By: Jerilynn Mages.  Shick M.D.   On: 05/12/2015 11:24   Dg Chest Portable 1 View  05/15/2015  CLINICAL DATA:  One episode of aphagia this morning.  Sudden onset of rt sided weakness and slurred speech this morning. Hx of stroke 05/09/15. Hx of copd. EXAM: PORTABLE CHEST 1 VIEW COMPARISON:  04/23/2015 FINDINGS: Midline trachea. Cardiomegaly accentuated by AP portable technique. No pleural effusion or pneumothorax. No congestive failure. Subsegmental atelectasis at both lung bases. IMPRESSION: Mild cardiomegaly, without acute disease. Electronically Signed   By: Abigail Miyamoto M.D.   On: 05/15/2015 15:42     Dg Swallowing Func-speech Pathology  05/10/2015  Ephraim Hamburger, CCC-SLP     05/10/2015  5:34 PM Objective Swallowing Evaluation: MBS-Modified Barium Swallow Study Patient Details Name: [name] MRN: [id number] Date of Birth: [birth year] Today's Date: 05/10/2015 Time: SLP Start Time (ACUTE ONLY): 1613-SLP Stop Time (ACUTE ONLY): 1643 SLP Time Calculation (min) (ACUTE ONLY): 30 min Past Medical History: Past Medical History Diagnosis Date . GERD (gastroesophageal reflux disease)  . CAD (coronary artery disease)  . Obesity  . Hyperlipidemia  . Hypertension  . Chronic back pain  . Gout  . Chronic bronchitis ([place])    "get it q yr" . Psoriasis  . COPD (chronic obstructive pulmonary disease) ([place])  . Myocardial infarction ([place]) 1999 . Pneumonia    "a few times; last time was ~ 06/2013" . On home oxygen therapy    "2L; only at night" (08/30/2013) . Diabetes mellitus, type 1  . Arthritis of knee  . Arthritis    "left leg" (08/30/2013) . Vitamin B12 deficiency 05/01/2015 . Abnormality of gait 05/01/2015 . Tussive syncope 05/01/2015 Past Surgical History: Past Surgical History Procedure Laterality Date . Appendectomy   . Colonoscopy  03/17/2012   Procedure: COLONOSCOPY;  Surgeon: Rogene Houston, MD;  Location: [facility];  Service: Endoscopy;  Laterality: N/A;  830 . Cystoscopy with urethral dilatation N/A 11/26/2012   Procedure: CYSTOSCOPY WITH URETHRAL DILATATION;  Surgeon: Malka So, MD;  Location: [facility] ORS;  Service: Urology;  Laterality: N/A; . Coronary  angioplasty with stent placement  1999- 2009-08/30/2013   "counting today's, I have 5 stents" (08/30/2013) . Left heart catheterization with coronary angiogram N/A 08/30/2013   Procedure: LEFT HEART CATHETERIZATION WITH CORONARY ANGIOGRAM;  Surgeon: Clent Demark, MD;  Location: [facility] CATH LAB;  Service: Cardiovascular;  Laterality: N/A; . Percutaneous coronary stent intervention (pci-s) Right 08/30/2013   Procedure: PERCUTANEOUS CORONARY STENT INTERVENTION (PCI-S);  Surgeon: Clent Demark, MD;  Location: [facility] CATH LAB;  Service: Cardiovascular;  Laterality: Right; HPI: Mr. Dazhon Newitt is a 74 yo male who was admitted yesterday with symptoms consistent with stroke. MRI showed acute lacunar infarct in the left paracentral pons, basilar artery perforator territory. No associated hemorrage or mass effect; Chronic small vessle ischemia in the deep gray matter nuclei, primarily the thalamostriate artery territory; Intracranial artery dolichoectasia. Moderate stenosis of the left ICA siphon. No major circle of Willis branch occlusion. Mr. Uplinger is known to this SLP from previous hospitalization a few weeks ago for COPD exacerbation and c/o of frequently choking during meals. A barium swallow was completed during that hospitalization which showed: Laryngeal penetration and aspiration of contrast with an additional episode of prolonged strong coughing when swallowing water with the barium tablet likely representing additional nonvisualized aspiration. MBSS recommended due to ongoing  c/o dysphagia now in setting of CVA. Subjective: Pt alert and cooperative, up in chair for MBSS Assessment / Plan / Recommendation CHL IP CLINICAL IMPRESSIONS 05/10/2015 Therapy Diagnosis WFL;Mild pharyngeal phase dysphagia Clinical Impression Mr. Delaguila demonstrated mild pharyngeal phase dysphagia characterized by premature spillage with delay in swallow initiation with thin liquids triggering after filling the pyriforms with cup and straw  sips; mild post swallow residuals noted in the pyriforms and lateral channels which cleared with cued repeat/dry swallow. No penetration or aspiration observed. Swallow function WNL for solid textures, however pt with subjective report of difficulty with dry, crunchy foods which elicit cough response. Pt with history of COPD and was recently hospitalized for the same and barium swallow completed during that hospitalization demonstrated aspiration of barium contrast (breathing was compromised during that stay). Recommend D3/mech soft with thin liquids with use of aspiration and reflux precautions. Pt to take small bites/sips, swallow 2x for each bite/sip, and avoid talking during meals.  Impact on safety and function Mild aspiration risk   CHL IP TREATMENT RECOMMENDATION 05/10/2015 Treatment Recommendations No treatment recommended at this time   Prognosis 05/10/2015 Prognosis for Safe Diet Advancement Good Barriers to Reach Goals -- Barriers/Prognosis Comment h/o COPD CHL IP DIET RECOMMENDATION 05/10/2015 SLP Diet Recommendations Dysphagia 3 (Mech soft) solids;Thin liquid Liquid Administration via Cup Medication Administration Whole meds with liquid Compensations Slow rate;Small sips/bites;Multiple dry swallows after each bite/sip Postural Changes Remain semi-upright after after feeds/meals (Comment);Seated upright at 90 degrees   CHL IP OTHER RECOMMENDATIONS 05/10/2015 Recommended Consults -- Oral Care Recommendations Oral care BID Other Recommendations Clarify dietary restrictions   CHL IP FOLLOW UP RECOMMENDATIONS 05/10/2015 Follow up Recommendations None   No flowsheet data found.     CHL IP ORAL PHASE 05/10/2015 Oral Phase WFL Oral - Pudding Teaspoon -- Oral - Pudding Cup -- Oral - Honey Teaspoon -- Oral - Honey Cup -- Oral - Nectar Teaspoon -- Oral - Nectar Cup -- Oral - Nectar Straw -- Oral - Thin Teaspoon -- Oral - Thin Cup -- Oral - Thin Straw -- Oral - Puree -- Oral - Mech Soft -- Oral - Regular -- Oral -  Multi-Consistency -- Oral - Pill -- Oral Phase - Comment --  CHL IP PHARYNGEAL PHASE 05/10/2015 Pharyngeal Phase Impaired Pharyngeal- Pudding Teaspoon -- Pharyngeal -- Pharyngeal- Pudding Cup -- Pharyngeal -- Pharyngeal- Honey Teaspoon -- Pharyngeal -- Pharyngeal- Honey Cup -- Pharyngeal -- Pharyngeal- Nectar Teaspoon -- Pharyngeal -- Pharyngeal- Nectar Cup -- Pharyngeal -- Pharyngeal- Nectar Straw -- Pharyngeal -- Pharyngeal- Thin Teaspoon -- Pharyngeal -- Pharyngeal- Thin Cup Delayed swallow initiation-vallecula;Delayed swallow initiation-pyriform sinuses;Pharyngeal residue - pyriform Pharyngeal -- Pharyngeal- Thin Straw Delayed swallow initiation-pyriform  sinuses;Pharyngeal residue - pyriform;Lateral channel residue Pharyngeal -- Pharyngeal- Puree WFL;Delayed swallow initiation-vallecula Pharyngeal -- Pharyngeal- Mechanical Soft -- Pharyngeal -- Pharyngeal- Regular Delayed swallow initiation-vallecula;WFL Pharyngeal -- Pharyngeal- Multi-consistency -- Pharyngeal -- Pharyngeal- Pill WFL Pharyngeal -- Pharyngeal Comment --  CHL IP CERVICAL ESOPHAGEAL PHASE 05/10/2015 Cervical Esophageal Phase WFL Pudding Teaspoon -- Pudding Cup -- Honey Teaspoon -- Honey Cup -- Nectar Teaspoon -- Nectar Cup -- Nectar Straw -- Thin Teaspoon -- Thin Cup -- Thin Straw -- Puree -- Mechanical Soft -- Regular -- Multi-consistency -- Pill -- Cervical Esophageal Comment -- Thank you, Genene Churn, CCC-SLP [phone] PORTER,DABNEY 05/10/2015, 5:34 PM               Microbiology: Recent Results (from the past 240 hour(s))  MRSA PCR Screening     Status: Abnormal   Collection Time: 05/15/15  9:30 PM  Result Value Ref Range Status   MRSA by PCR POSITIVE (A) NEGATIVE Final    Comment:        The GeneXpert MRSA Assay (FDA approved for NASAL specimens only), is one component of a comprehensive MRSA colonization surveillance program. It is not intended to diagnose MRSA infection nor to guide or monitor treatment for MRSA  infections. RESULT CALLED TO, READ BACK BY AND VERIFIED WITH: MUNDY R AT 0107 ON [id number] BY FORSYTH K      Labs: Basic Metabolic Panel:  Recent Labs Lab 05/12/15 0604 05/14/15 0802 05/14/15 0822 05/15/15 1200 05/15/15 1210  NA 137 139 136 137 139  K 4.6 4.4 4.3 4.5 4.5  CL 106 103 104 104 102  CO2 26  --  25 25  --   GLUCOSE 108* 82 83 97 95  BUN 25* 28* 28* 28* 26*  CREATININE 1.08 1.30* 1.34* 1.23 1.20  CALCIUM 8.6*  --  9.2 9.4  --    Liver Function Tests:  Recent Labs Lab 05/14/15 0822 05/15/15 1200  AST 12* 12*  ALT 12* 11*  ALKPHOS 64 67  BILITOT 0.5 0.4  PROT 7.2 7.6  ALBUMIN 3.6 3.7   CBC:  Recent Labs Lab 05/14/15 0802 05/14/15 0822 05/15/15 1200 05/15/15 1210  WBC  --  13.7* 13.3*  --   NEUTROABS  --  9.0* 9.6*  --   HGB 12.9* 11.2* 10.9* 12.6*  HCT 38.0* 35.4* 33.9* 37.0*  MCV  --  85.3 85.8  --   PLT  --  308 346  --     CBG:  Recent Labs Lab 05/17/15 0702 05/17/15 1114 05/17/15 1730 05/18/15 0121 05/18/15 0549  GLUCAP 126* 137* 139* 131* 117*    Principal Problem:   CVA (cerebral infarction) Active Problems:   Diabetes mellitus, insulin dependent (IDDM), uncontrolled (HCC)   Hypothyroid   Partial seizure (HCC)   Facial droop   Dysarthria   Right sided weakness   Time coordinating discharge: 35 minutes  Signed:  [name], MD [facility] 05/18/2015, 7:58 AM   By signing my name below, I, Rosalie Doctor attest that this documentation has been prepared under the direction and in the presence of [name], MD Electronically signed: Rosalie Doctor, Scribe.  05/18/2015  I personally performed the services described in this documentation. All medical record entries made by the scribe were at my direction. I have reviewed the chart and agree that the record reflects my personal performance and is accurate and complete. [name], MD

## 2015-05-18 NOTE — Care Management Note (Signed)
Case Management Note  Patient Details  Name: [name] MRN: [id number] Date of Birth: Jul 19, 1940  Subjective/Objective:                    Action/Plan:   Expected Discharge Date:                  Expected Discharge Plan:  Skilled Nursing Facility  In-House Referral:  Clinical Social Work  Discharge planning Services  CM Consult  Post Acute Care Choice:  NA Choice offered to:  NA  DME Arranged:    DME Agency:     HH Arranged:    [place] Agency:     Status of Service:  Completed, signed off  Medicare Important Message Given:  Yes Date Medicare IM Given:    Medicare IM give by:    Date Additional Medicare IM Given:    Additional Medicare Important Message give by:     If discussed at [place] of Stay Meetings, dates discussed:    Additional Comments: Pt discharged to [facility] today. CSW to arrange discharge to facility. Christinia Gully [place], RN 05/18/2015, 11:56 AM

## 2015-05-18 NOTE — Clinical Social Work Note (Addendum)
CSW spoke with Keri at [facility] and advised that patient was being discharged today and would be transported to the SNF via [facility] staff.    CSW spoke with patient and his wife who was at bedside. CSW discussed today's discharge to [facility]. Patient and wife remained agreeable for patient to go to [facility].   CSW spoke with Ailene Ravel at [facility] and patient's auth # [id number]    CSW sent clinicals via [employer].    CSW signing off.   Ihor Gully, [place] [phone]

## 2015-05-18 NOTE — Progress Notes (Signed)
PROGRESS NOTE  [name] [id number] DOB: [birth year] DOA: 05/15/2015 PCP: Tula Nakayama, MD  Summary: 69 yom PMHx of diabetes mellitus type 2, COPD and history of MI came into the hospital with a left facial droop and right-sided weakness. Patient admitted recently to the hospital from 12/22 until the 24th, he was admitted for similar symptoms which resolved while he was in the hospital. At that time, MRI showed pontine stroke but patient was able to communicate, walk and swallow without problems. He was discharged home on aspirin. On 12/26, patient had worsening of right-sided weakness so he came in to the ED. CT was negative so he was sent home to follow-up with neurology and outpatient PT. Patient developed facial droop when he woke up from sleep on 12/27 so his wife brought him to the hospital for further evaluation. In the ED patient had complete right-sided flaccidity, facial droop and dysarthria, MRI of the brain was repeated and showed dorsal extension of the left paracentral pontine stroke.  Assessment/Plan: 1. Left paracentral pontine stroke with RUE/RLE flaccidity, facial droop, and dysarthria. Some improvement in strength. LDL 78. Hgb A1C 7.1. Neurology consulted and recommends dual antiplatelets for 3 months and then Plavix. PT and OT recommend SNF.  2. Dysarthia, dysphagia, speech therapy consulted and recommends dysphagia 1 (Puree);Honey-thick liquid . Tolerating diet.  3. Suspected partial seizures on last admission, started on Keppra during last admission. Continue Keppra, no evidence of seizures. 4. DM type 2, stable. Continue SSI 5. Hypothyroidism, continue Synthroid 6. Dyslipidemia, continue statin.  7. Coronary atherosclerosis. 8. Vitamin B12 deficiency. 9. Chronic leukocytosis, unclear etiology. F/u as an outpatient.  10. Normocytic anemia, chronic, stable. 11. Constipation, will start bowel regimen.    Overall stable. Continue ASA and Plavix.  Discharge  to SNF today.  Code Status: Full DVT prophylaxis: Heparin Family Communication:  Wife at bedside. Disposition Plan: Anticipate discharge today.  [name], MD  [facility]  Pager [phone] If 7PM-7AM, please contact night-coverage at [email or web address], password [facility] 05/18/2015, 7:15 AM  LOS: 3 days   Consultants:  Neurology  PT- SNF.   OT- SNF  ST- Dysphagia 1 (Puree);Honey-thick liquid   Procedures:  none  Antibiotics:  none  HPI/Subjective: Feels good. Denies any pain, nausea, vomiting, or SOB. Wife reports speech and strength better.  Objective: Filed Vitals:   05/17/15 2150 05/18/15 0030 05/18/15 0200 05/18/15 0600  BP: 127/64 132/68 109/50 134/64  Pulse: 86 94 86 98  Temp: 98.2 F (36.8 C) 98.8 F (37.1 C) 98.6 F (37 C) 99.1 F (37.3 C)  TempSrc:  Oral Oral Oral  Resp: 18 18 18 16   Height:      Weight:      SpO2: 98% 97% 95% 98%    Intake/Output Summary (Last 24 hours) at 05/18/15 0715 Last data filed at 05/18/15 0430  Gross per 24 hour  Intake    240 ml  Output    800 ml  Net   -560 ml    Filed Weights   05/15/15 1148 05/15/15 1815 05/16/15 0500  Weight: 106.142 kg (234 lb) 104 kg (229 lb 4.5 oz) 103.7 kg (228 lb 9.9 oz)    Exam:    VSS, afebrile General:  Appears comfortable, calm. Eyes: unchanged; left lid weakness ENT: left facial weakness unchanged Cardiovascular: Regular rate and rhythm, no murmur, rub or gallop. No lower extremity edema. Respiratory: Clear to auscultation bilaterally, no wheezes, rales or rhonchi. Normal respiratory effort. Musculoskeletal: right grip strength improved, able to  move right arm along the bed. Right shoulder shrug improved. RLE improved movement, still unable to lift off bed.  Psychiatric: grossly normal mood and affect, speech dysarthric but intelligible Neurologic: no change   New data reviewed:  CBG stable  Pertinent data since admission:  CMP unremarkable  Hgb 10.9, WBC  13.3  MRI of the brain - Dorsal extension of the left paracentral pontine infarct since the 22nd. No associated hemorrhage or mass effect.  Pending Data:  UC  Scheduled Meds: . allopurinol  300 mg Oral Daily  . aspirin  325 mg Oral Daily  . Chlorhexidine Gluconate Cloth  6 each Topical Q0600  . clopidogrel  75 mg Oral Daily  . heparin  5,000 Units Subcutaneous 3 times per day  . insulin aspart  0-9 Units Subcutaneous Q6H  . levETIRAcetam  500 mg Oral BID  . levothyroxine  75 mcg Oral QAC breakfast  . mupirocin ointment  1 application Nasal BID  . pantoprazole  40 mg Oral Daily  . pravastatin  80 mg Oral q1800  . spironolactone  25 mg Oral Daily   Continuous Infusions:    Principal Problem:   CVA (cerebral infarction) Active Problems:   Diabetes mellitus, insulin dependent (IDDM), uncontrolled (HCC)   Hypothyroid   Partial seizure ([place])   Facial droop   Dysarthria   Right sided weakness   By signing my name below, I, Rosalie Doctor attest that this documentation has been prepared under the direction and in the presence of [name], MD Electronically signed: Rosalie Doctor, Scribe.  05/18/2015   I personally performed the services described in this documentation. All medical record entries made by the scribe were at my direction. I have reviewed the chart and agree that the record reflects my personal performance and is accurate and complete. [name], MD

## 2015-05-19 LAB — URINE CULTURE: Culture: >=100000

## 2015-05-20 DIAGNOSIS — I639 Cerebral infarction, unspecified: Secondary | ICD-10-CM

## 2015-05-20 HISTORY — DX: Cerebral infarction, unspecified: I63.9

## 2015-05-21 ENCOUNTER — Non-Acute Institutional Stay (SKILLED_NURSING_FACILITY): Payer: Commercial Managed Care - HMO | Admitting: Internal Medicine

## 2015-05-21 DIAGNOSIS — J441 Chronic obstructive pulmonary disease with (acute) exacerbation: Secondary | ICD-10-CM | POA: Diagnosis not present

## 2015-05-21 DIAGNOSIS — I6350 Cerebral infarction due to unspecified occlusion or stenosis of unspecified cerebral artery: Secondary | ICD-10-CM

## 2015-05-21 DIAGNOSIS — I639 Cerebral infarction, unspecified: Secondary | ICD-10-CM

## 2015-05-21 DIAGNOSIS — R569 Unspecified convulsions: Secondary | ICD-10-CM | POA: Diagnosis not present

## 2015-05-22 ENCOUNTER — Other Ambulatory Visit: Payer: Self-pay | Admitting: *Deleted

## 2015-05-22 DIAGNOSIS — I639 Cerebral infarction, unspecified: Secondary | ICD-10-CM

## 2015-05-22 NOTE — Progress Notes (Addendum)
Patient ID: [name], male   DOB: 1940/06/17, 75 y.o.   MRN: [id number]                HISTORY & PHYSICAL  DATE:  05/21/2015        FACILITY: [facility]                 LEVEL OF CARE:   SNF   HISTORY OF PRESENT ILLNESS:  This is an unfortunate man who suffered a left paracentral pontine stroke.  I think his initial admission was from 05/09/2015 through 05/12/2015.    He was also started on Keppra for possible partial seizures.    He was placed on aspirin.    He represented to hospital from 05/15/2015 through 05/18/2015 with increasing weakness.  He was diagnosed with having an extension of the left paracentral pontine stroke with a new right upper and lower extremity flaccid paralysis, left-sided facial droop, and dysarthria.  Apparently during the original admission, his symptoms basically resolved.    His hemoglobin A1c was 7.1.  LDL was 78.    He was started on a dysphagia I diet.  He has honey-thick liquids which he was tolerating well.    He was put on Keppra during the original admission for what was felt to be partial seizures.  There was none of that observed in the hospital.    PAST MEDICAL HISTORY/PROBLEM LIST:   Past Medical History  Diagnosis Date  . GERD (gastroesophageal reflux disease)   . CAD (coronary artery disease)   . Obesity   . Hyperlipidemia   . Hypertension   . Chronic back pain   . Gout   . Chronic bronchitis ([place])     "get it q yr"  . Psoriasis   . COPD (chronic obstructive pulmonary disease) ([place])   . Myocardial infarction ([place]) 1999  . Pneumonia     "a few times; last time was ~ 06/2013"  . On home oxygen therapy     "2L; only at night" (08/30/2013)  . Diabetes mellitus, type 1   . Arthritis of knee   . Arthritis     "left leg" (08/30/2013)  . Vitamin B12 deficiency 05/01/2015  . Abnormality of gait 05/01/2015  . Tussive syncope 05/01/2015  . Stroke [facility])       PAST SURGICAL HISTORY:    Past Surgical History    Procedure Laterality Date  . Appendectomy    . Colonoscopy  03/17/2012    Procedure: COLONOSCOPY;  Surgeon: Rogene Houston, MD;  [facility];  Service: Endoscopy;  Laterality: N/A;  830  . Cystoscopy with urethral dilatation N/A 11/26/2012    Procedure: CYSTOSCOPY WITH URETHRAL DILATATION;  Surgeon: Malka So, MD;  Location: [facility] ORS;  Service: Urology;  Laterality: N/A;  . Coronary angioplasty with stent placement  1999- 2009-08/30/2013    "counting today's, I have 5 stents" (08/30/2013)  . Left heart catheterization with coronary angiogram N/A 08/30/2013    Procedure: LEFT HEART CATHETERIZATION WITH CORONARY ANGIOGRAM;  Surgeon: Clent Demark, MD;  Location: [facility] CATH LAB;  Service: Cardiovascular;  Laterality: N/A;  . Percutaneous coronary stent intervention (pci-s) Right 08/30/2013    Procedure: PERCUTANEOUS CORONARY STENT INTERVENTION (PCI-S);  Surgeon: Clent Demark, MD;  Location: [facility] CATH LAB;  Service: Cardiovascular;  Laterality: Right;       CURRENT MEDICATIONS:  Medication list is reviewed.   *Notable for the fact that he stopped his 70/30.  SOCIAL HISTORY:                   HOUSING:  The patient lives at home with his wife.     TOBACCO USE:  Ex-smoker.     FAMILY HISTORY:   Family History  Problem Relation Age of Onset  . Hypertension Mother   . Diabetes Mother   . Hypertension Father   . Diabetes Brother   . Arthritis      Family History   . Diabetes      family History   . Stroke Daughter        REVIEW OF SYSTEMS:       HEENT:   He is having trouble with the left eye at night.  He cannot actually close this.  He will need lubricating eye ointment.  He also states that he has occasional diplopia.             CHEST/RESPIRATORY:  Not on oxygen at home.  No cough.  No shortness of breath.   CARDIAC:  No chest pain.    GI:  States he is constipated.   GU:  He is not complaining of dysuria or hematuria.   MUSCULOSKELETAL:  No active joint pain.    NEUROLOGICAL:  He is noting some return in the right hand.    PHYSICAL EXAMINATION:   VITAL SIGNS:     02 SATURATIONS:  95% on 2 L.   GENERAL APPEARANCE:  The patient is not in any distress.    HEENT:   MOUTH/THROAT:  His tongue is coated.  This may be thrush.   CHEST/RESPIRATORY:  Shallow, but otherwise clear air entry.   CARDIOVASCULAR:   CARDIAC:  Heart sounds are normal.  There are no murmurs.  No carotid bruits.   GASTROINTESTINAL:   ABDOMEN:  Distended.  No masses.     LIVER/SPLEEN/KIDNEY:   No liver, no spleen.  No tenderness.    GENITOURINARY:   BLADDER:  No clear bladder distention or CVA tenderness.     NEUROLOGICAL:   CRANIAL NERVES:  The patient has a left lower motor neuron 7th lesion and a left 6th neuropalsy.  All of this is quite compatible with his known stroke.   SENSATION/STRENGTH:  He has marked weakness of the right arm and leg.  He is having some return in the right hand, which is gratifying.   PSYCHIATRIC:   MENTAL STATUS:  He appears to be cognitively intact.  No overt depression at this point.    ASSESSMENT/PLAN:             Extension of a left paracentral pontine stroke.  His MRI of the brain on this occasion showed dorsal extension of the stroke.  He will need lubricating eye ointment at night.  I will arrange this.    Suspected seizures.  On Keppra.  This is stable.    COPD.  This does not appear to be currently unstable.  He was not previously on oxygen.    Type 2 diabetes.  He is only supposed to be on a sliding scale, although I do not even see that order right now.  He is also on metformin 500 b.i.d.  The 70/30 insulin that he was on was stopped.  I do not believe he was on insulin at home.    Hypertension.  This is controlled.    History of gout.  On allopurinol.    Hypothyroidism.  On replacement.  His TSH on 05/07/2015  was lower than the normal range at 1.312.     CPT CODE: 57846

## 2015-05-22 NOTE — Patient Outreach (Signed)
Noted patient discharged from acute inpatient to [facility] for Skilled care on 05/18/15 Order sent to [facility] to follow for discharge planning. Will continue [facility] community when discharged back home. Royetta Crochet. Laymond Purser, RN, BSN, [place] [phone]

## 2015-05-23 ENCOUNTER — Other Ambulatory Visit: Payer: Self-pay | Admitting: Licensed Clinical Social Worker

## 2015-05-23 NOTE — Patient Outreach (Signed)
Assessment:  CSW received referral on Thom Rossy. [place] on 05/23/15.  CSW completed chart review on client on 05/23/15.  CSW has scheduled an appointment to meet with client at [facility] in [place], [place] on 05/24/15 at 11:15 AM. Client is currently a patient at [facility].  Plan: CSW to conduct visit with client at [facility] in [place], [place] on 05/24/15 at 11:15AM to discuss needs of client.  [name].Ahriyah Vannest MSW, LCSW Licensed Clinical Social Worker [facility] Care Management [phone]

## 2015-05-24 ENCOUNTER — Encounter: Payer: Self-pay | Admitting: Licensed Clinical Social Worker

## 2015-05-24 ENCOUNTER — Other Ambulatory Visit: Payer: Self-pay | Admitting: Licensed Clinical Social Worker

## 2015-05-24 NOTE — Patient Outreach (Signed)
[facility]) Care Management  [facility] Social Work  05/24/2015  [name] May 29, 1940 [id number]  Subjective:    Objective:   Current Medications:  No current outpatient prescriptions on file.   No current facility-administered medications for this visit.    Functional Status:  In your present state of health, do you have any difficulty performing the following activities: 05/24/2015 05/15/2015  Hearing? N N  Vision? N N  Difficulty concentrating or making decisions? N N  Walking or climbing stairs? Y Y  Dressing or bathing? Y Y  Doing errands, shopping? N N  Preparing Food and eating ? Y -  Using the Toilet? N -  In the past six months, have you accidently leaked urine? N -  Do you have problems with loss of bowel control? N -  Managing your Medications? Y -  Managing your Finances? N -  Housekeeping or managing your Housekeeping? Y -    Fall/Depression Screening:  PHQ 2/9 Scores 05/24/2015 04/25/2015 03/27/2015 02/27/2015 01/31/2015 11/30/2014 11/16/2014  PHQ - 2 Score 0 0 0 0 0 0 0    Assessment:   CSW traveled to [facility] in [place], [place] on 05/24/15 to meet with client. CSW met with client at [facility] on 1/5/17at room of client.  Patient assessed in  [place] for continued care needs. CSW will continue to collaborate with the skilled nursing facility social worker to facilitate discharge planning needs and communicate with the patient and family. Client and CSW spoke of client needs.  Client reported that client is receiving scheduled physical therapy sessions at facility and that these physical therapy sessions are helping client. Client reported that he is receiving occupational therapy sessions and also receiving speech therapy sessions at facility. Client has support from his wife and other family members.  Client reported he had experienced several falls recently and had been hospitalized recently.  He admitted to [facility] following hospitalization and is now receiving care at [facility].  He hopes to be able to remain at [facility] for a few weeks and then hopes to return home with his wife with needed supports in place. Client said he has some confusion since his recent stroke and has trouble recalling details at present. He is left handed.  He has right sided weakness at present.  CSW and client spoke of client care plan and client said he would try to participate in scheduled physical therapy sessions at facility for client in next 30 days. CSW spoke of [facility] program and support services of this program. Client said he had previously been working with RN Burgess Amor in the community through [facility] program. [facility] written consent has been completed for client. CSW and client completed several [facility] assessments for client. CSW also encouraged client to communicate, as needed, with facility social worker to help client complete discharge plan for client.  Again, client is hoping that he can remain at [facility] for short term care at present.   CSW gave client [facility] card and invited client to call CSW at [phone] as needed for social work support. CSW thanked client for client visit at [facility] with [place] on 05/24/15.  Plan:   Client to participate in scheduled physical therapy sessions for client at [facility] in next 30 days CSW to call client in three weeks to assess client needs at that time.   [name].Vera Furniss MSW, [facility] Licensed Holiday representative [facility]  Care Management [phone]

## 2015-05-29 ENCOUNTER — Ambulatory Visit: Payer: Self-pay | Admitting: "Endocrinology

## 2015-05-31 ENCOUNTER — Ambulatory Visit: Payer: Commercial Managed Care - HMO | Admitting: Family Medicine

## 2015-06-01 ENCOUNTER — Encounter: Payer: Self-pay | Admitting: Internal Medicine

## 2015-06-01 ENCOUNTER — Non-Acute Institutional Stay (SKILLED_NURSING_FACILITY): Payer: Commercial Managed Care - HMO | Admitting: Internal Medicine

## 2015-06-01 ENCOUNTER — Other Ambulatory Visit: Payer: Commercial Managed Care - HMO

## 2015-06-01 ENCOUNTER — Ambulatory Visit (HOSPITAL_COMMUNITY): Payer: Commercial Managed Care - HMO | Attending: Internal Medicine

## 2015-06-01 DIAGNOSIS — M546 Pain in thoracic spine: Secondary | ICD-10-CM | POA: Diagnosis not present

## 2015-06-01 DIAGNOSIS — M5136 Other intervertebral disc degeneration, lumbar region: Secondary | ICD-10-CM | POA: Insufficient documentation

## 2015-06-01 DIAGNOSIS — E1121 Type 2 diabetes mellitus with diabetic nephropathy: Secondary | ICD-10-CM

## 2015-06-01 DIAGNOSIS — M25552 Pain in left hip: Secondary | ICD-10-CM | POA: Diagnosis not present

## 2015-06-01 DIAGNOSIS — S299XXA Unspecified injury of thorax, initial encounter: Secondary | ICD-10-CM | POA: Diagnosis not present

## 2015-06-01 DIAGNOSIS — M545 Low back pain: Secondary | ICD-10-CM | POA: Insufficient documentation

## 2015-06-01 DIAGNOSIS — G8929 Other chronic pain: Secondary | ICD-10-CM | POA: Insufficient documentation

## 2015-06-01 DIAGNOSIS — M5489 Other dorsalgia: Secondary | ICD-10-CM | POA: Diagnosis not present

## 2015-06-01 DIAGNOSIS — S3992XA Unspecified injury of lower back, initial encounter: Secondary | ICD-10-CM | POA: Diagnosis not present

## 2015-06-01 DIAGNOSIS — I639 Cerebral infarction, unspecified: Secondary | ICD-10-CM

## 2015-06-01 DIAGNOSIS — M549 Dorsalgia, unspecified: Secondary | ICD-10-CM | POA: Insufficient documentation

## 2015-06-01 DIAGNOSIS — S2231XA Fracture of one rib, right side, initial encounter for closed fracture: Secondary | ICD-10-CM | POA: Insufficient documentation

## 2015-06-01 DIAGNOSIS — M25551 Pain in right hip: Secondary | ICD-10-CM | POA: Diagnosis not present

## 2015-06-01 DIAGNOSIS — Z9189 Other specified personal risk factors, not elsewhere classified: Secondary | ICD-10-CM | POA: Insufficient documentation

## 2015-06-01 DIAGNOSIS — W19XXXA Unspecified fall, initial encounter: Secondary | ICD-10-CM | POA: Insufficient documentation

## 2015-06-01 NOTE — Progress Notes (Signed)
Patient ID: [name], male   DOB: [birth year], 75 y.o.   MRN: [id number]              DATE:  06/01/2015        FACILITY: [facility]                 LEVEL OF CARE:   SNF  Chief complaint-acute visit secondary to right side pain after fall   HISTORY OF PRESENT ILLNESS:  This is a man who suffered a left paracentral pontine stroke.  his initial admission was in late December    He was also started on Keppra for possible partial seizures.    He was placed on aspirin.    He represented to hospital from 05/15/2015 through 05/18/2015 with increasing weakness.  He was diagnosed with having an extension of the left paracentral pontine stroke with a new right upper and lower extremity flaccid paralysis, left-sided facial droop, and dysarthria.  Apparently during the original admission, his symptoms basically resolved.    His hemoglobin A1c was 7.1.  LDL was 78.      He was put on Keppra during the original admission for what was felt to be partial seizures.  There was none of that observed in the hospital.  Patient has a has gained some strength during his stay here is died actually is being upgraded apparently as well.  Apparently he had a recent fall with no apparent injuries apparently this was about 2 nights ago he was in bed and apparently somehow fell on the floor-however recently has been complaining of some right sided pain-.  The pain is more in his back area.  His vital signs are stable otherwise has no complaints in regards to the CVA again he is gaining some strength appears to be doing better    PAST MEDICAL HISTORY/PROBLEM LIST:   Past Medical History  Diagnosis Date  . GERD (gastroesophageal reflux disease)   . CAD (coronary artery disease)   . Obesity   . Hyperlipidemia   . Hypertension   . Chronic back pain   . Gout   . Chronic bronchitis ([place])     "get it q yr"  . Psoriasis   . COPD (chronic obstructive pulmonary disease) ([place])   . Myocardial  infarction ([place]) 1999  . Pneumonia     "a few times; last time was ~ 06/2013"  . On home oxygen therapy     "2L; only at night" (08/30/2013)  . Diabetes mellitus, type 1   . Arthritis of knee   . Arthritis     "left leg" (08/30/2013)  . Vitamin B12 deficiency 05/01/2015  . Abnormality of gait 05/01/2015  . Tussive syncope 05/01/2015  . Stroke [facility])       PAST SURGICAL HISTORY:    Past Surgical History  Procedure Laterality Date  . Appendectomy    . Colonoscopy  03/17/2012    Procedure: COLONOSCOPY;  Surgeon: Rogene Houston, MD;  [facility];  Service: Endoscopy;  Laterality: N/A;  830  . Cystoscopy with urethral dilatation N/A 11/26/2012    Procedure: CYSTOSCOPY WITH URETHRAL DILATATION;  Surgeon: Malka So, MD;  Location: [facility] ORS;  Service: Urology;  Laterality: N/A;  . Coronary angioplasty with stent placement  1999- 2009-08/30/2013    "counting today's, I have 5 stents" (08/30/2013)  . Left heart catheterization with coronary angiogram N/A 08/30/2013    Procedure: LEFT HEART CATHETERIZATION WITH CORONARY ANGIOGRAM;  Surgeon: Prudencio Burly  Daivd Council, MD;  Location: [facility] CATH LAB;  Service: Cardiovascular;  Laterality: N/A;  . Percutaneous coronary stent intervention (pci-s) Right 08/30/2013    Procedure: PERCUTANEOUS CORONARY STENT INTERVENTION (PCI-S);  Surgeon: Clent Demark, MD;  Location: [facility] CATH LAB;  Service: Cardiovascular;  Laterality: Right;       CURRENT MEDICATIONS:  Medication list is reviewed.   *Notable for the fact that he stopped his 70/30.        SOCIAL HISTORY:                   HOUSING:  The patient lives at home with his wife.     TOBACCO USE:  Ex-smoker.     FAMILY HISTORY:   Family History  Problem Relation Age of Onset  . Hypertension Mother   . Diabetes Mother   . Hypertension Father   . Diabetes Brother   . Arthritis      Family History   . Diabetes      family History   . Stroke Daughter        REVIEW OF SYSTEMS: Gen. no complaints of  fever chills.  Skin does not complain of rashes or itching       HEENT:   He is having trouble with the left eye at night.  He cannot actually close this. Has lubricating eye ointment.  He also states that he has occasional diplopia--apparently-discomfort is improving.             CHEST/RESPIRATORY: .  No cough.  No shortness of breath.   CARDIAC:  No chest pain.    GI:  States he is constipated.   GU:  He is not complaining of dysuria or hematuria.   MUSCULOSKELETAL:  No active joint pain--does complain of some soreness more so in his right back area.   NEUROLOGICAL:  He is noting some return in the right hand and this progressively apparently has gotten somewhat better.    PHYSICAL EXAMINATION:    Temp to 98.1 pulse 74 respirations 20 blood pressure 115/61  Gen. this is a pleasant elderly male in no distress sitting comfortably in his wheelchair.  His skin is warm and dry --s.    .   CHEST/RESPIRATORY:  Shallow, but otherwise clear air entry.   CARDIOVASCULAR:   CARDIAC:  Heart sounds are normal.  There are no murmurs.  No carotid bruits.   GASTROINTESTINAL:   ABDOMEN:  Distended.  No masses all sounds are positive.      Muscle skeletal again does have significant right-sided weakness but actually is gaining strength since his readmission-I do not note any deformity of his right hip area or back there is some mild tenderness to palpation of his back and right lateral thorax area-I do not see any overt deformities however.     NEUROLOGICAL:   CRANIAL NERVES:  The patient has a left lower motor neuron 7th lesion and a left 6th neuropalsy.  All of this is quite compatible with his known stroke.   SENSATION/STRENGTH:  He has marked weakness of the right arm and leg.  He is having some return in the right hand, which per nursing continues to improve.   PSYCHIATRIC:   MENTAL STATUS:  He appears to be cognitively intact.  No overt depression at this point  Labs.  This M 28 2016.  CBC  13.3 hemoglobin 10.9 platelets 340.  Sodium 137 potassium 4.5 BUN 28 creatinine 1.23.  AST 12-ALT 11.  05/07/2015.  TSH 1.312.  .    ASSESSMENT/PLAN:  Right-sided discomfort status post fall-will x-ray the area including his back as well as right hip and thorax area-he does not appear to be in acute discomfort but apparently has some soreness here            Extension of a left paracentral pontine stroke.  His MRI of the brain on this occasion showed dorsal extension of the stroke.  He continues to make some recovery on his right side-he does continue on Plavix as well as aspirin.    Suspected seizures.  On Keppra.  This is stable.    COPD.  This does not appear to be currently unstable.  [place] Kitchen  He is  on metformin 500 b.i.d.  The 70/30 insulin that he was on was stopped.  I do not believe he was on insulin at home--CBGs are stable largely in the low to mid 100s-since they are stable will decrease CBG checks to twice a day.    Hypertension.  This is controlled. Recent blood pressures 115/61-117/68    History of gout.  On allopurinol.  History renal insufficiency with recent creatinine of 1.23 BUN of 28 this appears to be stable we will update this next week.     History of leukocytosis this appears to be chronic will update this next week as well     [id number]

## 2015-06-04 ENCOUNTER — Other Ambulatory Visit: Payer: Self-pay

## 2015-06-04 ENCOUNTER — Observation Stay (HOSPITAL_COMMUNITY)
Admission: EM | Admit: 2015-06-04 | Discharge: 2015-06-05 | Disposition: A | Discharge status: Home / Self Care | Payer: Commercial Managed Care - HMO | Attending: Internal Medicine | Admitting: Internal Medicine

## 2015-06-04 ENCOUNTER — Encounter (HOSPITAL_COMMUNITY): Payer: Self-pay | Admitting: *Deleted

## 2015-06-04 ENCOUNTER — Encounter (HOSPITAL_COMMUNITY)
Admission: RE | Admit: 2015-06-04 | Discharge: 2015-06-04 | Disposition: A | Discharge status: Home / Self Care | Payer: Commercial Managed Care - HMO | Source: Skilled Nursing Facility | Attending: Internal Medicine | Admitting: Internal Medicine

## 2015-06-04 DIAGNOSIS — E785 Hyperlipidemia, unspecified: Secondary | ICD-10-CM | POA: Insufficient documentation

## 2015-06-04 DIAGNOSIS — E875 Hyperkalemia: Secondary | ICD-10-CM | POA: Diagnosis not present

## 2015-06-04 DIAGNOSIS — I2510 Atherosclerotic heart disease of native coronary artery without angina pectoris: Secondary | ICD-10-CM | POA: Diagnosis not present

## 2015-06-04 DIAGNOSIS — K219 Gastro-esophageal reflux disease without esophagitis: Secondary | ICD-10-CM | POA: Diagnosis not present

## 2015-06-04 DIAGNOSIS — M1712 Unilateral primary osteoarthritis, left knee: Secondary | ICD-10-CM | POA: Insufficient documentation

## 2015-06-04 DIAGNOSIS — E538 Deficiency of other specified B group vitamins: Secondary | ICD-10-CM | POA: Insufficient documentation

## 2015-06-04 DIAGNOSIS — E109 Type 1 diabetes mellitus without complications: Secondary | ICD-10-CM | POA: Insufficient documentation

## 2015-06-04 DIAGNOSIS — E669 Obesity, unspecified: Secondary | ICD-10-CM | POA: Insufficient documentation

## 2015-06-04 DIAGNOSIS — Z9861 Coronary angioplasty status: Secondary | ICD-10-CM | POA: Insufficient documentation

## 2015-06-04 DIAGNOSIS — N182 Chronic kidney disease, stage 2 (mild): Secondary | ICD-10-CM

## 2015-06-04 DIAGNOSIS — Z872 Personal history of diseases of the skin and subcutaneous tissue: Secondary | ICD-10-CM | POA: Insufficient documentation

## 2015-06-04 DIAGNOSIS — I69392 Facial weakness following cerebral infarction: Secondary | ICD-10-CM | POA: Insufficient documentation

## 2015-06-04 DIAGNOSIS — Z9981 Dependence on supplemental oxygen: Secondary | ICD-10-CM | POA: Insufficient documentation

## 2015-06-04 DIAGNOSIS — I639 Cerebral infarction, unspecified: Secondary | ICD-10-CM | POA: Diagnosis present

## 2015-06-04 DIAGNOSIS — Z7902 Long term (current) use of antithrombotics/antiplatelets: Secondary | ICD-10-CM | POA: Insufficient documentation

## 2015-06-04 DIAGNOSIS — M109 Gout, unspecified: Secondary | ICD-10-CM | POA: Diagnosis not present

## 2015-06-04 DIAGNOSIS — I10 Essential (primary) hypertension: Secondary | ICD-10-CM | POA: Diagnosis present

## 2015-06-04 DIAGNOSIS — J449 Chronic obstructive pulmonary disease, unspecified: Secondary | ICD-10-CM | POA: Insufficient documentation

## 2015-06-04 DIAGNOSIS — G8929 Other chronic pain: Secondary | ICD-10-CM | POA: Insufficient documentation

## 2015-06-04 DIAGNOSIS — Z8701 Personal history of pneumonia (recurrent): Secondary | ICD-10-CM | POA: Insufficient documentation

## 2015-06-04 DIAGNOSIS — E039 Hypothyroidism, unspecified: Secondary | ICD-10-CM

## 2015-06-04 DIAGNOSIS — Z79899 Other long term (current) drug therapy: Secondary | ICD-10-CM | POA: Insufficient documentation

## 2015-06-04 DIAGNOSIS — Z7982 Long term (current) use of aspirin: Secondary | ICD-10-CM | POA: Insufficient documentation

## 2015-06-04 DIAGNOSIS — Z87891 Personal history of nicotine dependence: Secondary | ICD-10-CM | POA: Insufficient documentation

## 2015-06-04 DIAGNOSIS — E782 Mixed hyperlipidemia: Secondary | ICD-10-CM | POA: Diagnosis present

## 2015-06-04 DIAGNOSIS — N179 Acute kidney failure, unspecified: Secondary | ICD-10-CM | POA: Diagnosis not present

## 2015-06-04 DIAGNOSIS — I69351 Hemiplegia and hemiparesis following cerebral infarction affecting right dominant side: Secondary | ICD-10-CM | POA: Insufficient documentation

## 2015-06-04 DIAGNOSIS — E1142 Type 2 diabetes mellitus with diabetic polyneuropathy: Secondary | ICD-10-CM | POA: Diagnosis present

## 2015-06-04 DIAGNOSIS — I252 Old myocardial infarction: Secondary | ICD-10-CM | POA: Insufficient documentation

## 2015-06-04 LAB — BASIC METABOLIC PANEL
ANION GAP: 9 (ref 5–15)
Anion gap: 4 — ABNORMAL LOW (ref 5–15)
Anion gap: 6 (ref 5–15)
Anion gap: 6 (ref 5–15)
BUN: 37 mg/dL — AB (ref 6–20)
BUN: 38 mg/dL — ABNORMAL HIGH (ref 6–20)
BUN: 37 mg/dL — ABNORMAL HIGH (ref 6–20)
BUN: 35 mg/dL — ABNORMAL HIGH (ref 6–20)
CALCIUM: 9.1 mg/dL (ref 8.9–10.3)
CHLORIDE: 102 mmol/L (ref 101–111)
CO2: 29 mmol/L (ref 22–32)
CO2: 29 mmol/L (ref 22–32)
CO2: 29 mmol/L (ref 22–32)
CO2: 30 mmol/L (ref 22–32)
CREATININE: 1.46 mg/dL — AB (ref 0.61–1.24)
Calcium: 9.5 mg/dL (ref 8.9–10.3)
Calcium: 9.7 mg/dL (ref 8.9–10.3)
Calcium: 10.3 mg/dL (ref 8.9–10.3)
Chloride: 105 mmol/L (ref 101–111)
Chloride: 102 mmol/L (ref 101–111)
Chloride: 102 mmol/L (ref 101–111)
Creatinine, Ser: 1.54 mg/dL — ABNORMAL HIGH (ref 0.61–1.24)
Creatinine, Ser: 1.53 mg/dL — ABNORMAL HIGH (ref 0.61–1.24)
Creatinine, Ser: 1.49 mg/dL — ABNORMAL HIGH (ref 0.61–1.24)
GFR calc Af Amer: 53 mL/min — ABNORMAL LOW (ref >60)
GFR calc Af Amer: 50 mL/min — ABNORMAL LOW (ref >60)
GFR calc Af Amer: 50 mL/min — ABNORMAL LOW (ref >60)
GFR calc Af Amer: 52 mL/min — ABNORMAL LOW (ref >60)
GFR calc non Af Amer: 43 mL/min — ABNORMAL LOW (ref >60)
GFR calc non Af Amer: 43 mL/min — ABNORMAL LOW (ref >60)
GFR, EST NON AFRICAN AMERICAN: 46 mL/min — AB (ref >60)
GFR, EST NON AFRICAN AMERICAN: 45 mL/min — AB (ref >60)
GLUCOSE: 119 mg/dL — AB (ref 65–99)
GLUCOSE: 137 mg/dL — AB (ref 65–99)
Glucose, Bld: 122 mg/dL — ABNORMAL HIGH (ref 65–99)
Glucose, Bld: 184 mg/dL — ABNORMAL HIGH (ref 65–99)
POTASSIUM: 6 mmol/L — AB (ref 3.5–5.1)
Potassium: 6.8 mmol/L (ref 3.5–5.1)
Potassium: 6.8 mmol/L (ref 3.5–5.1)
Potassium: 7.4 mmol/L (ref 3.5–5.1)
SODIUM: 141 mmol/L (ref 135–145)
Sodium: 138 mmol/L (ref 135–145)
Sodium: 137 mmol/L (ref 135–145)
Sodium: 137 mmol/L (ref 135–145)

## 2015-06-04 LAB — CBC WITH DIFFERENTIAL/PLATELET
BASOS PCT: 1 %
Basophils Absolute: 0.1 10*3/uL (ref 0.0–0.1)
Basophils Absolute: 0.1 10*3/uL (ref 0.0–0.1)
Basophils Relative: 1 %
EOS ABS: 0.4 10*3/uL (ref 0.0–0.7)
EOS PCT: 4 %
Eosinophils Absolute: 0.4 10*3/uL (ref 0.0–0.7)
Eosinophils Relative: 3 %
HCT: 32.3 % — ABNORMAL LOW (ref 39.0–52.0)
HEMATOCRIT: 29.5 % — AB (ref 39.0–52.0)
Hemoglobin: 9.4 g/dL — ABNORMAL LOW (ref 13.0–17.0)
Hemoglobin: 10 g/dL — ABNORMAL LOW (ref 13.0–17.0)
Lymphocytes Relative: 27 %
Lymphocytes Relative: 25 %
Lymphs Abs: 2.8 10*3/uL (ref 0.7–4.0)
Lymphs Abs: 2.7 10*3/uL (ref 0.7–4.0)
MCH: 27.8 pg (ref 26.0–34.0)
MCH: 27 pg (ref 26.0–34.0)
MCHC: 31.9 g/dL (ref 30.0–36.0)
MCHC: 31 g/dL (ref 30.0–36.0)
MCV: 87.3 fL (ref 78.0–100.0)
MCV: 87.3 fL (ref 78.0–100.0)
MONO ABS: 0.5 10*3/uL (ref 0.1–1.0)
MONOS PCT: 5 %
Monocytes Absolute: 0.7 10*3/uL (ref 0.1–1.0)
Monocytes Relative: 6 %
Neutro Abs: 6.5 10*3/uL (ref 1.7–7.7)
Neutro Abs: 7.1 10*3/uL (ref 1.7–7.7)
Neutrophils Relative %: 63 %
Neutrophils Relative %: 65 %
PLATELETS: 312 10*3/uL (ref 150–400)
Platelets: 334 10*3/uL (ref 150–400)
RBC: 3.38 MIL/uL — ABNORMAL LOW (ref 4.22–5.81)
RBC: 3.7 MIL/uL — ABNORMAL LOW (ref 4.22–5.81)
RDW: 14.9 % (ref 11.5–15.5)
RDW: 14.9 % (ref 11.5–15.5)
WBC: 10.3 10*3/uL (ref 4.0–10.5)
WBC: 10.9 10*3/uL — ABNORMAL HIGH (ref 4.0–10.5)

## 2015-06-04 LAB — TSH: TSH: 3.385 u[IU]/mL (ref 0.350–4.500)

## 2015-06-04 LAB — GLUCOSE, CAPILLARY: GLUCOSE-CAPILLARY: 121 mg/dL — AB (ref 65–99)

## 2015-06-04 MED ORDER — METOPROLOL TARTRATE 25 MG PO TABS
25.0000 mg | ORAL_TABLET | Freq: Two times a day (BID) | ORAL | Status: DC
  Administered 2015-06-04 – 2015-06-05 (×2): 25 mg via ORAL
  Filled 2015-06-04 (×2): qty 1

## 2015-06-04 MED ORDER — FUROSEMIDE 10 MG/ML IJ SOLN
40.0000 mg | Freq: Once | INTRAMUSCULAR | Status: AC
  Administered 2015-06-04: 40 mg via INTRAVENOUS
  Filled 2015-06-04: qty 4

## 2015-06-04 MED ORDER — DEXTROSE 50 % IV SOLN
50.0000 mL | Freq: Once | INTRAVENOUS | Status: AC
Start: 2015-06-04 — End: 2015-06-04
  Administered 2015-06-04: 50 mL via INTRAVENOUS

## 2015-06-04 MED ORDER — DEXTROSE 50 % IV SOLN
INTRAVENOUS | Status: AC
  Filled 2015-06-04: qty 50

## 2015-06-04 MED ORDER — VITAMIN B-12 1000 MCG PO TABS
1000.0000 ug | ORAL_TABLET | Freq: Every day | ORAL | Status: DC
  Administered 2015-06-05: 1000 ug via ORAL
  Filled 2015-06-04: qty 1

## 2015-06-04 MED ORDER — CLOPIDOGREL BISULFATE 75 MG PO TABS
75.0000 mg | ORAL_TABLET | Freq: Every day | ORAL | Status: DC
  Administered 2015-06-05: 75 mg via ORAL
  Filled 2015-06-04: qty 1

## 2015-06-04 MED ORDER — ALLOPURINOL 300 MG PO TABS
300.0000 mg | ORAL_TABLET | Freq: Every day | ORAL | Status: DC
  Administered 2015-06-05: 300 mg via ORAL
  Filled 2015-06-04: qty 1

## 2015-06-04 MED ORDER — LACRISERT 5 MG OP INST: 5.0000 mg | VAGINAL_INSERT | Freq: Every day | OPHTHALMIC | Status: DC

## 2015-06-04 MED ORDER — PANTOPRAZOLE SODIUM 40 MG PO TBEC
40.0000 mg | DELAYED_RELEASE_TABLET | Freq: Every day | ORAL | Status: DC
  Administered 2015-06-05: 40 mg via ORAL
  Filled 2015-06-04: qty 1

## 2015-06-04 MED ORDER — ASPIRIN 325 MG PO TABS
325.0000 mg | ORAL_TABLET | Freq: Every day | ORAL | Status: DC
Start: 2015-06-04 — End: 2015-06-05
  Administered 2015-06-05: 325 mg via ORAL
  Filled 2015-06-04: qty 1

## 2015-06-04 MED ORDER — SODIUM CHLORIDE 0.9 % IJ SOLN
3.0000 mL | Freq: Two times a day (BID) | INTRAMUSCULAR | Status: DC
  Administered 2015-06-04 – 2015-06-05 (×2): 3 mL via INTRAVENOUS

## 2015-06-04 MED ORDER — AMLODIPINE BESYLATE 5 MG PO TABS
10.0000 mg | ORAL_TABLET | Freq: Every day | ORAL | Status: DC
  Administered 2015-06-05: 10 mg via ORAL
  Filled 2015-06-04: qty 2

## 2015-06-04 MED ORDER — ACETAMINOPHEN 500 MG PO TABS: 500.0000 mg | ORAL_TABLET | Freq: Four times a day (QID) | ORAL | Status: DC | PRN

## 2015-06-04 MED ORDER — DEXTROSE 50 % IV SOLN
1.0000 | Freq: Once | INTRAVENOUS | Status: AC
  Administered 2015-06-04: 50 mL via INTRAVENOUS

## 2015-06-04 MED ORDER — LEVOTHYROXINE SODIUM 75 MCG PO TABS
75.0000 ug | ORAL_TABLET | Freq: Every morning | ORAL | Status: DC
  Administered 2015-06-05: 75 ug via ORAL
  Filled 2015-06-04: qty 1

## 2015-06-04 MED ORDER — DEXTROSE 50 % IV SOLN
INTRAVENOUS | Status: AC
  Administered 2015-06-04: 50 mL via INTRAVENOUS
  Filled 2015-06-04: qty 50

## 2015-06-04 MED ORDER — PRAVASTATIN SODIUM 40 MG PO TABS
80.0000 mg | ORAL_TABLET | Freq: Every day | ORAL | Status: DC
  Administered 2015-06-04: 80 mg via ORAL
  Filled 2015-06-04 (×2): qty 2

## 2015-06-04 MED ORDER — ALBUTEROL SULFATE (2.5 MG/3ML) 0.083% IN NEBU
2.5000 mg | INHALATION_SOLUTION | Freq: Once | RESPIRATORY_TRACT | Status: AC
  Administered 2015-06-04: 2.5 mg via RESPIRATORY_TRACT
  Filled 2015-06-04: qty 3

## 2015-06-04 MED ORDER — SODIUM CHLORIDE 0.9 % IV SOLN
INTRAVENOUS | Status: DC
  Administered 2015-06-04: 17:00:00 via INTRAVENOUS

## 2015-06-04 MED ORDER — INSULIN ASPART 100 UNIT/ML ~~LOC~~ SOLN
10.0000 [IU] | Freq: Once | SUBCUTANEOUS | Status: AC
  Administered 2015-06-04: 10 [IU] via SUBCUTANEOUS
  Filled 2015-06-04: qty 1

## 2015-06-04 MED ORDER — SENNOSIDES-DOCUSATE SODIUM 8.6-50 MG PO TABS: 1.0000 | ORAL_TABLET | Freq: Every evening | ORAL | Status: DC | PRN

## 2015-06-04 MED ORDER — INSULIN ASPART 100 UNIT/ML ~~LOC~~ SOLN
0.0000 [IU] | Freq: Three times a day (TID) | SUBCUTANEOUS | Status: DC
  Administered 2015-06-05: 3 [IU] via SUBCUTANEOUS

## 2015-06-04 MED ORDER — SODIUM BICARBONATE 8.4 % IV SOLN
50.0000 meq | Freq: Once | INTRAVENOUS | Status: AC
  Administered 2015-06-04: 50 meq via INTRAVENOUS
  Filled 2015-06-04: qty 50

## 2015-06-04 MED ORDER — ONDANSETRON HCL 4 MG PO TABS: 4.0000 mg | ORAL_TABLET | Freq: Four times a day (QID) | ORAL | Status: DC | PRN

## 2015-06-04 MED ORDER — SENNA 8.6 MG PO TABS
1.0000 | ORAL_TABLET | Freq: Every day | ORAL | Status: DC
  Administered 2015-06-05: 8.6 mg via ORAL
  Filled 2015-06-04: qty 1

## 2015-06-04 MED ORDER — SODIUM POLYSTYRENE SULFONATE 15 GM/60ML PO SUSP
30.0000 g | Freq: Once | ORAL | Status: AC
  Administered 2015-06-04: 30 g via ORAL
  Filled 2015-06-04: qty 120

## 2015-06-04 MED ORDER — DEXTROSE 50 % IV SOLN: 1.0000 | Freq: Once | INTRAVENOUS | Status: DC

## 2015-06-04 MED ORDER — RESOURCE THICKENUP CLEAR PO POWD
ORAL | Status: DC | PRN
  Filled 2015-06-04: qty 125

## 2015-06-04 MED ORDER — SODIUM CHLORIDE 0.9 % IV SOLN
1.0000 g | Freq: Once | INTRAVENOUS | Status: AC
  Administered 2015-06-04: 1 g via INTRAVENOUS
  Filled 2015-06-04: qty 10

## 2015-06-04 MED ORDER — ONDANSETRON HCL 4 MG/2ML IJ SOLN: 4.0000 mg | Freq: Four times a day (QID) | INTRAMUSCULAR | Status: DC | PRN

## 2015-06-04 MED ORDER — INSULIN ASPART 100 UNIT/ML ~~LOC~~ SOLN
10.0000 [IU] | Freq: Once | SUBCUTANEOUS | Status: AC
  Administered 2015-06-04: 10 [IU] via INTRAVENOUS
  Filled 2015-06-04: qty 1

## 2015-06-04 MED ORDER — INSULIN ASPART 100 UNIT/ML ~~LOC~~ SOLN
4.0000 [IU] | Freq: Three times a day (TID) | SUBCUTANEOUS | Status: DC
  Administered 2015-06-05 (×2): 4 [IU] via SUBCUTANEOUS

## 2015-06-04 MED ORDER — ENOXAPARIN SODIUM 40 MG/0.4ML ~~LOC~~ SOLN
40.0000 mg | SUBCUTANEOUS | Status: DC
  Administered 2015-06-04 – 2015-06-05 (×2): 40 mg via SUBCUTANEOUS
  Filled 2015-06-04 (×2): qty 0.4

## 2015-06-04 MED ORDER — INSULIN ASPART 100 UNIT/ML ~~LOC~~ SOLN
0.0000 [IU] | Freq: Every day | SUBCUTANEOUS | Status: DC
Start: 2015-06-04 — End: 2015-06-05

## 2015-06-04 MED ORDER — LEVETIRACETAM 500 MG PO TABS
500.0000 mg | ORAL_TABLET | Freq: Two times a day (BID) | ORAL | Status: DC
  Administered 2015-06-04 – 2015-06-05 (×2): 500 mg via ORAL
  Filled 2015-06-04 (×2): qty 1

## 2015-06-04 NOTE — ED Notes (Signed)
Potassium 6.8 reported to Dr. Wilson Singer.

## 2015-06-04 NOTE — ED Notes (Addendum)
Pt comes in from [facility] for abnormal lab Potassium 6.9

## 2015-06-04 NOTE — ED Provider Notes (Signed)
CSN: [id number]     Arrival date & time 06/04/15  [id number] History  By signing my name below, I, Terressa Koyanagi, attest that this documentation has been prepared under the direction and in the presence of Virgel Manifold, MD. Electronically Signed: Terressa Koyanagi, ED Scribe. 06/04/2015. 10:08 AM.   Chief Complaint  Patient presents with  . Abnormal Lab   The history is provided by the patient. No language interpreter was used.   PCP: Tula Nakayama, MD HPI Comments: [name] is a 75 y.o. male, with PMHx noted below including stroke on 05/09/15, who presents to the Emergency Department from [facility] complaining of abnormal labs. Pt had lab work completed this morning that showed elevated levels of potassium of 6.9. Pt denies any pain or any other Sx at this time.  Says he feels good.  Reports improving functional status at facility and looking forward to go back home. Is on spironalactone.   Past Medical History  Diagnosis Date  . GERD (gastroesophageal reflux disease)   . CAD (coronary artery disease)   . Obesity   . Hyperlipidemia   . Hypertension   . Chronic back pain   . Gout   . Chronic bronchitis ([place])     "get it q yr"  . Psoriasis   . COPD (chronic obstructive pulmonary disease) ([place])   . Myocardial infarction ([place]) 1999  . Pneumonia     "a few times; last time was ~ 06/2013"  . On home oxygen therapy     "2L; only at night" (08/30/2013)  . Diabetes mellitus, type 1   . Arthritis of knee   . Arthritis     "left leg" (08/30/2013)  . Vitamin B12 deficiency 05/01/2015  . Abnormality of gait 05/01/2015  . Tussive syncope 05/01/2015  . Stroke [facility])    Past Surgical History  Procedure Laterality Date  . Appendectomy    . Colonoscopy  03/17/2012    Procedure: COLONOSCOPY;  Surgeon: Rogene Houston, MD;  [facility];  Service: Endoscopy;  Laterality: N/A;  830  . Cystoscopy with urethral dilatation N/A 11/26/2012    Procedure: CYSTOSCOPY WITH URETHRAL  DILATATION;  Surgeon: Malka So, MD;  Location: [facility] ORS;  Service: Urology;  Laterality: N/A;  . Coronary angioplasty with stent placement  1999- 2009-08/30/2013    "counting today's, I have 5 stents" (08/30/2013)  . Left heart catheterization with coronary angiogram N/A 08/30/2013    Procedure: LEFT HEART CATHETERIZATION WITH CORONARY ANGIOGRAM;  Surgeon: Clent Demark, MD;  Location: [facility] CATH LAB;  Service: Cardiovascular;  Laterality: N/A;  . Percutaneous coronary stent intervention (pci-s) Right 08/30/2013    Procedure: PERCUTANEOUS CORONARY STENT INTERVENTION (PCI-S);  Surgeon: Clent Demark, MD;  Location: [facility] CATH LAB;  Service: Cardiovascular;  Laterality: Right;   Family History  Problem Relation Age of Onset  . Hypertension Mother   . Diabetes Mother   . Hypertension Father   . Diabetes Brother   . Arthritis      Family History   . Diabetes      family History   . Stroke Daughter    Social History  Substance Use Topics  . Smoking status: Former Smoker -- 0.50 packs/day for 33 years    Types: Cigarettes  . Smokeless tobacco: Never Used     Comment: "stopped smoking in the 1990's  . Alcohol Use: No    Review of Systems  Constitutional: Negative for fever.  Musculoskeletal: Negative for arthralgias.  All other systems reviewed and are negative.  Allergies  Review of patient's allergies indicates no known allergies.  Home Medications   Prior to Admission medications   Medication Sig Start Date End Date Taking? Authorizing Provider  acetaminophen (TYLENOL) 500 MG tablet Take 500 mg by mouth every 6 (six) hours as needed for moderate pain (left thigh).   Yes Historical Provider, MD  albuterol (PROVENTIL HFA;VENTOLIN HFA) 108 (90 BASE) MCG/ACT inhaler Inhale 1-2 puffs into the lungs every 6 (six) hours as needed for wheezing or shortness of breath. 11/13/14  Yes Nat Christen, MD  allopurinol (ZYLOPRIM) 300 MG tablet Take 300 mg by mouth daily. 05/02/13  Yes Historical  Provider, MD  amLODipine (NORVASC) 10 MG tablet Take 1 tablet (10 mg total) by mouth daily. 09/20/14  Yes Fayrene Helper, MD  aspirin 325 MG tablet Take 1 tablet (325 mg total) by mouth daily. 05/12/15  Yes Kathie Dike, MD  clopidogrel (PLAVIX) 75 MG tablet Take 1 tablet (75 mg total) by mouth daily. 05/18/15  Yes Samuella Cota, MD  hydroxypropyl cellulose (LACRISERT) 5 MG INST Apply 5 mg to eye daily.   Yes Historical Provider, MD  levETIRAcetam (KEPPRA) 500 MG tablet Take 1 tablet (500 mg total) by mouth 2 (two) times daily. 05/12/15  Yes Kathie Dike, MD  levothyroxine (SYNTHROID, LEVOTHROID) 75 MCG tablet Take 75 mcg by mouth every morning.    Yes Historical Provider, MD  metFORMIN (GLUCOPHAGE) 500 MG tablet Take 1 tablet (500 mg total) by mouth 2 (two) times daily. 09/02/13  Yes Charolette Forward, MD  metoprolol (LOPRESSOR) 50 MG tablet Take 0.5 tablets (25 mg total) by mouth 2 (two) times daily. Patient taking differently: Take 50 mg by mouth 2 (two) times daily.  05/18/15  Yes Samuella Cota, MD  pantoprazole (PROTONIX) 40 MG tablet Take 1 tablet (40 mg total) by mouth daily before breakfast. 05/12/15  Yes Kathie Dike, MD  pravastatin (PRAVACHOL) 40 MG tablet Take 2 tablets (80 mg total) by mouth daily. 05/18/15  Yes Samuella Cota, MD  senna (SENOKOT) 8.6 MG tablet Take 1 tablet by mouth daily.   Yes Historical Provider, MD  spironolactone (ALDACTONE) 25 MG tablet Take 1 tablet (25 mg total) by mouth daily. 12/25/14  Yes Fayrene Helper, MD  vitamin B-12 (CYANOCOBALAMIN) 1000 MCG tablet Take 1,000 mcg by mouth daily.   Yes Historical Provider, MD  Maltodextrin-Xanthan Gum (RESOURCE THICKENUP CLEAR) POWD As needed 05/18/15   Samuella Cota, MD   Triage Vitals: BP 139/73 mmHg  Pulse 68  Temp(Src) 97.4 F (36.3 C) (Oral)  Resp 19  Ht 5\' 10"  (1.778 m)  Wt 232 lb (105.235 kg)  BMI 33.29 kg/m2  SpO2 99% Physical Exam  Constitutional: He is oriented to person, place,  and time. He appears well-developed and well-nourished.  HENT:  Head: Normocephalic and atraumatic.  Eyes: Conjunctivae and EOM are normal. Pupils are equal, round, and reactive to light.  Neck: Normal range of motion. Neck supple.  Cardiovascular: Normal rate and regular rhythm.   Pulmonary/Chest: Effort normal and breath sounds normal.  Abdominal: Soft. Bowel sounds are normal.  Musculoskeletal: Normal range of motion.  Neurological: He is alert and oriented to person, place, and time.  Left facial droop, right hemiparesis.   Skin: Skin is warm and dry.  Psychiatric: He has a normal mood and affect. His behavior is normal.  Nursing note and vitals reviewed.   ED Course  Procedures (including critical care time)  CRITICAL CARE Performed by: Virgel Manifold Total critical care time: 40 minutes Critical care time was exclusive of separately billable procedures and treating other patients. Critical care was necessary to treat or prevent imminent or life-threatening deterioration. Critical care was time spent personally by me on the following activities: development of treatment plan with patient and/or surrogate as well as nursing, discussions with consultants, evaluation of patient's response to treatment, examination of patient, obtaining history from patient or surrogate, ordering and performing treatments and interventions, ordering and review of laboratory studies, ordering and review of radiographic studies, pulse oximetry and re-evaluation of patient's condition.  DIAGNOSTIC STUDIES: Oxygen Saturation is 99% on ra, nl by my interpretation.    COORDINATION OF CARE: 9:05 AM: Discussed treatment plan which includes labs with pt at bedside; patient verbalizes understanding and agrees with treatment plan.  Labs Review Labs Reviewed  CBC WITH DIFFERENTIAL/PLATELET - Abnormal; Notable for the following:    WBC 10.9 (*)    RBC 3.70 (*)    Hemoglobin 10.0 (*)    HCT 32.3 (*)    All  other components within normal limits  BASIC METABOLIC PANEL - Abnormal; Notable for the following:    Potassium 6.8 (*)    Glucose, Bld 122 (*)    BUN 38 (*)    Creatinine, Ser 1.54 (*)    GFR calc non Af Amer 43 (*)    GFR calc Af Amer 50 (*)    All other components within normal limits   I have personally reviewed and evaluated these lab results as part of my medical decision-making.   EKG Interpretation  Date/Time:  Monday June 04 2015 08:50:21 EST Ventricular Rate:  69 PR Interval:  227 QRS Duration: 88 QT Interval:  361 QTC Calculation: 387 R Axis:   -17 Text Interpretation:  Sinus rhythm Prolonged PR interval  left axis deviation Confirmed by Wilson Singer  MD, Corbin Falck ([id number]) on 06/04/2015 9:38:25 AM        MDM   Final diagnoses:  Hyperkalemia    74yM with hyperkalemia. On aldactone. No acute complaints. Intervals ok on initial EKG aside from 1st degree AV block which is chronic. Treated initially with calcium, bicarb, insulin, lasix and kayexalate. Repeat potassium actually higher. No mention of hemolysis. Redose calcium, bicarb, insulin and additional kayexalate. Repeat EKG. Will discuss with medicine for admission.    I personally preformed the services scribed in my presence. The recorded information has been reviewed is accurate. Virgel Manifold, MD.    Virgel Manifold, MD 06/04/15 1130

## 2015-06-04 NOTE — H&P (Signed)
[facility]          History and Physical    PCP:   Tula Nakayama, MD   EDP: Virgel Manifold, M.D.  Chief Complaint:  Hyperkalemia  HPI: Patient is a 75 year old man who was sent from his SNF with complaints of hyperkalemia. It appears his potassium was 6.3. He was sent to the emergency department where he was given calcium gluconate, bicarbonate, insulin with D5, Kayexalate. Despite this potassium on subsequent measurement increased to 7.4 and he was referred for admission for further evaluation and management.  Allergies:  No Known Allergies    Past Medical History  Diagnosis Date  . GERD (gastroesophageal reflux disease)   . CAD (coronary artery disease)   . Obesity   . Hyperlipidemia   . Hypertension   . Chronic back pain   . Gout   . Chronic bronchitis ([place])     "get it q yr"  . Psoriasis   . COPD (chronic obstructive pulmonary disease) ([place])   . Myocardial infarction ([place]) 1999  . Pneumonia     "a few times; last time was ~ 06/2013"  . On home oxygen therapy     "2L; only at night" (08/30/2013)  . Diabetes mellitus, type 1   . Arthritis of knee   . Arthritis     "left leg" (08/30/2013)  . Vitamin B12 deficiency 05/01/2015  . Abnormality of gait 05/01/2015  . Tussive syncope 05/01/2015  . Stroke ([place])   . CVA (cerebral infarction)     Past Surgical History  Procedure Laterality Date  . Appendectomy    . Colonoscopy  03/17/2012    Procedure: COLONOSCOPY;  Surgeon: Rogene Houston, MD;  [facility];  Service: Endoscopy;  Laterality: N/A;  830  . Cystoscopy with urethral dilatation N/A 11/26/2012    Procedure: CYSTOSCOPY WITH URETHRAL DILATATION;  Surgeon: Malka So, MD;  Location: [facility] ORS;  Service: Urology;  Laterality: N/A;  . Coronary angioplasty with stent placement  1999- 2009-08/30/2013    "counting today's, I have 5 stents" (08/30/2013)  . Left heart catheterization with coronary angiogram N/A 08/30/2013   Procedure: LEFT HEART CATHETERIZATION WITH CORONARY ANGIOGRAM;  Surgeon: Clent Demark, MD;  Location: [facility] CATH LAB;  Service: Cardiovascular;  Laterality: N/A;  . Percutaneous coronary stent intervention (pci-s) Right 08/30/2013    Procedure: PERCUTANEOUS CORONARY STENT INTERVENTION (PCI-S);  Surgeon: Clent Demark, MD;  Location: [facility] CATH LAB;  Service: Cardiovascular;  Laterality: Right;    Prior to Admission medications   Medication Sig Start Date End Date Taking? Authorizing Provider  acetaminophen (TYLENOL) 500 MG tablet Take 500 mg by mouth every 6 (six) hours as needed for moderate pain (left thigh).   Yes Historical Provider, MD  albuterol (PROVENTIL HFA;VENTOLIN HFA) 108 (90 BASE) MCG/ACT inhaler Inhale 1-2 puffs into the lungs every 6 (six) hours as needed for wheezing or shortness of breath. 11/13/14  Yes Nat Christen, MD  allopurinol (ZYLOPRIM) 300 MG tablet Take 300 mg by mouth daily. 05/02/13  Yes Historical Provider, MD  amLODipine (NORVASC) 10 MG tablet Take 1 tablet (10 mg total) by mouth daily. 09/20/14  Yes Fayrene Helper, MD  aspirin 325 MG tablet Take 1 tablet (325 mg total) by mouth daily. 05/12/15  Yes Kathie Dike, MD  clopidogrel (PLAVIX) 75 MG tablet Take 1 tablet (75 mg total) by mouth daily. 05/18/15  Yes Samuella Cota,  MD  hydroxypropyl cellulose (LACRISERT) 5 MG INST Apply 5 mg to eye daily.   Yes Historical Provider, MD  levETIRAcetam (KEPPRA) 500 MG tablet Take 1 tablet (500 mg total) by mouth 2 (two) times daily. 05/12/15  Yes Kathie Dike, MD  levothyroxine (SYNTHROID, LEVOTHROID) 75 MCG tablet Take 75 mcg by mouth every morning.    Yes Historical Provider, MD  metFORMIN (GLUCOPHAGE) 500 MG tablet Take 1 tablet (500 mg total) by mouth 2 (two) times daily. 09/02/13  Yes Charolette Forward, MD  metoprolol (LOPRESSOR) 50 MG tablet Take 0.5 tablets (25 mg total) by mouth 2 (two) times daily. Patient taking differently: Take 50 mg by mouth 2 (two) times daily.   05/18/15  Yes Samuella Cota, MD  pantoprazole (PROTONIX) 40 MG tablet Take 1 tablet (40 mg total) by mouth daily before breakfast. 05/12/15  Yes Kathie Dike, MD  pravastatin (PRAVACHOL) 40 MG tablet Take 2 tablets (80 mg total) by mouth daily. 05/18/15  Yes Samuella Cota, MD  senna (SENOKOT) 8.6 MG tablet Take 1 tablet by mouth daily.   Yes Historical Provider, MD  spironolactone (ALDACTONE) 25 MG tablet Take 1 tablet (25 mg total) by mouth daily. 12/25/14  Yes Fayrene Helper, MD  vitamin B-12 (CYANOCOBALAMIN) 1000 MCG tablet Take 1,000 mcg by mouth daily.   Yes Historical Provider, MD  Maltodextrin-Xanthan Gum ([place]) POWD As needed 05/18/15   Samuella Cota, MD    Social History:  reports that he has quit smoking. His smoking use included Cigarettes. He has a 16.5 pack-year smoking history. He has never used smokeless tobacco. He reports that he does not drink alcohol or use illicit drugs.  Family History  Problem Relation Age of Onset  . Hypertension Mother   . Diabetes Mother   . Hypertension Father   . Diabetes Brother   . Arthritis      Family History   . Diabetes      family History   . Stroke Daughter     Review of Systems:  Constitutional: Denies fever, chills, diaphoresis, appetite change and fatigue.  HEENT: Denies photophobia, eye pain, redness, hearing loss, ear pain, congestion, sore throat, rhinorrhea, sneezing, mouth sores, trouble swallowing, neck pain, neck stiffness and tinnitus.   Respiratory: Denies SOB, DOE, cough, chest tightness,  and wheezing.   Cardiovascular: Denies chest pain, palpitations and leg swelling.  Gastrointestinal: Denies nausea, vomiting, abdominal pain, diarrhea, constipation, blood in stool and abdominal distention.  Genitourinary: Denies dysuria, urgency, frequency, hematuria, flank pain and difficulty urinating.  Endocrine: Denies: hot or cold intolerance, sweats, changes in hair or nails, polyuria,  polydipsia. Musculoskeletal: Denies myalgias, back pain, joint swelling, arthralgias and gait problem.  Skin: Denies pallor, rash and wound.  Neurological: Denies dizziness, seizures, syncope, weakness, light-headedness, numbness and headaches.  Hematological: Denies adenopathy. Easy bruising, personal or family bleeding history  Psychiatric/Behavioral: Denies suicidal ideation, mood changes, confusion, nervousness, sleep disturbance and agitation   Physical Exam: Blood pressure 115/68, pulse 95, temperature 97.7 F (36.5 C), temperature source Oral, resp. rate 20, height '5\' 10"'$  (1.778 m), weight 101.923 kg (224 lb 11.2 oz), SpO2 90 %. General: Alert, awake, oriented 3, no current distress HEENT: Normocephalic, atraumatic, left-sided ptosis Neck: Supple, no JVD, lymphadenopathy, no bruits, no goiter Cardiovascular: Regular rate and rhythm, no murmurs, rubs or gallops Lungs: Clear to auscultation bilaterally Abdomen: Soft, nontender, nondistended, positive bowel sounds Extremities: 1+ pedal edema bilaterally Neurologic: Residual right hemiparesis from recent CVA  Labs on  Admission:  Results for orders placed or performed during the hospital encounter of 06/04/15 (from the past 48 hour(s))  CBC with Differential     Status: Abnormal   Collection Time: 06/04/15  9:00 AM  Result Value Ref Range   WBC 10.9 (H) 4.0 - 10.5 K/uL   RBC 3.70 (L) 4.22 - 5.81 MIL/uL   Hemoglobin 10.0 (L) 13.0 - 17.0 g/dL   HCT 32.3 (L) 39.0 - 52.0 %   MCV 87.3 78.0 - 100.0 fL   MCH 27.0 26.0 - 34.0 pg   MCHC 31.0 30.0 - 36.0 g/dL   RDW 14.9 11.5 - 15.5 %   Platelets 334 150 - 400 K/uL   Neutrophils Relative % 65 %   Neutro Abs 7.1 1.7 - 7.7 K/uL   Lymphocytes Relative 25 %   Lymphs Abs 2.7 0.7 - 4.0 K/uL   Monocytes Relative 6 %   Monocytes Absolute 0.7 0.1 - 1.0 K/uL   Eosinophils Relative 3 %   Eosinophils Absolute 0.4 0.0 - 0.7 K/uL   Basophils Relative 1 %   Basophils Absolute 0.1 0.0 - 0.1 K/uL    Basic metabolic panel     Status: Abnormal   Collection Time: 06/04/15  9:00 AM  Result Value Ref Range   Sodium 137 135 - 145 mmol/L   Potassium 6.8 (HH) 3.5 - 5.1 mmol/L    Comment: CRITICAL RESULT CALLED TO, READ BACK BY AND VERIFIED WITH: VOGLER,T AT 9:35AM ON 06/04/15 BY FESTERMAN,C    Chloride 102 101 - 111 mmol/L   CO2 29 22 - 32 mmol/L   Glucose, Bld 122 (H) 65 - 99 mg/dL   BUN 38 (H) 6 - 20 mg/dL   Creatinine, Ser 1.54 (H) 0.61 - 1.24 mg/dL   Calcium 9.5 8.9 - 10.3 mg/dL   GFR calc non Af Amer 43 (L) >60 mL/min   GFR calc Af Amer 50 (L) >60 mL/min    Comment: (NOTE) The eGFR has been calculated using the CKD EPI equation. This calculation has not been validated in all clinical situations. eGFR's persistently <60 mL/min signify possible Chronic Kidney Disease.    Anion gap 6 5 - 15  Basic metabolic panel     Status: Abnormal   Collection Time: 06/04/15 10:39 AM  Result Value Ref Range   Sodium 137 135 - 145 mmol/L   Potassium 7.4 (HH) 3.5 - 5.1 mmol/L    Comment: CRITICAL RESULT CALLED TO, READ BACK BY AND VERIFIED WITH: CARTER,J AT 11:20AM ON 06/04/15 BY FESTERMAN,C    Chloride 102 101 - 111 mmol/L   CO2 29 22 - 32 mmol/L   Glucose, Bld 184 (H) 65 - 99 mg/dL   BUN 37 (H) 6 - 20 mg/dL   Creatinine, Ser 1.53 (H) 0.61 - 1.24 mg/dL   Calcium 9.7 8.9 - 10.3 mg/dL   GFR calc non Af Amer 43 (L) >60 mL/min   GFR calc Af Amer 50 (L) >60 mL/min    Comment: (NOTE) The eGFR has been calculated using the CKD EPI equation. This calculation has not been validated in all clinical situations. eGFR's persistently <60 mL/min signify possible Chronic Kidney Disease.    Anion gap 6 5 - 15    Radiological Exams on Admission: No results found.  Assessment/Plan Principal Problem:   Hyperkalemia Active Problems:   Diabetes mellitus, insulin dependent (IDDM), uncontrolled (HCC)   Hyperlipidemia with target LDL less than 70   Essential hypertension   CVA (cerebral  infarction)  ARF (acute renal failure) (HCC)   CKD (chronic kidney disease) stage 2, GFR 60-89 ml/min   Hypothyroidism    Hyperkalemia -Has already been adequately treated, despite this levels have not child. -We'll order an additional 30 g of Kayexalate and will recheck potassium in 2 hours with plans to further treat if necessary. -He has no current EKG changes or symptoms. -Suspect this may be related to spironolactone, this is been placed on hold.  Hypothyroidism -Check TSH, continue home dose of Synthroid  Acute on chronic kidney disease stage II  -Baseline creatinine is around 1.2, current creatinine is 1.4, give IV fluids.  Hypertension -Well-controlled, continue home medications  Insulin-dependent diabetes mellitus -Check hemoglobin A1c, place on sliding scale insulin  DVT prophylaxis Lovenox  CODE STATUS -Full code    Time Spent on Admission: 80 minutes  HERNANDEZ ACOSTA,ESTELA [facility] Pager: [phone] 06/04/2015, 4:34 PM

## 2015-06-04 NOTE — ED Notes (Signed)
Overide done to pull D50 from Pyxis, not pulling up as available on screen. Dose to be administered when Calcium gluconate is complete.

## 2015-06-04 NOTE — ED Notes (Signed)
Report given to Jennifer, RN

## 2015-06-04 NOTE — ED Notes (Signed)
Patient requested something to eat and drink.  Dr. Approved.  Then patient stated he could only eat soft food and can only drink water.  Patient was given water.

## 2015-06-05 DIAGNOSIS — M1380 Other specified arthritis, unspecified site: Secondary | ICD-10-CM | POA: Diagnosis not present

## 2015-06-05 DIAGNOSIS — K219 Gastro-esophageal reflux disease without esophagitis: Secondary | ICD-10-CM | POA: Diagnosis not present

## 2015-06-05 DIAGNOSIS — N182 Chronic kidney disease, stage 2 (mild): Secondary | ICD-10-CM | POA: Diagnosis not present

## 2015-06-05 DIAGNOSIS — I2510 Atherosclerotic heart disease of native coronary artery without angina pectoris: Secondary | ICD-10-CM | POA: Diagnosis not present

## 2015-06-05 DIAGNOSIS — M109 Gout, unspecified: Secondary | ICD-10-CM | POA: Diagnosis not present

## 2015-06-05 DIAGNOSIS — N179 Acute kidney failure, unspecified: Secondary | ICD-10-CM | POA: Diagnosis not present

## 2015-06-05 DIAGNOSIS — E0829 Diabetes mellitus due to underlying condition with other diabetic kidney complication: Secondary | ICD-10-CM | POA: Diagnosis not present

## 2015-06-05 DIAGNOSIS — I69953 Hemiplegia and hemiparesis following unspecified cerebrovascular disease affecting right non-dominant side: Secondary | ICD-10-CM | POA: Diagnosis not present

## 2015-06-05 DIAGNOSIS — R1312 Dysphagia, oropharyngeal phase: Secondary | ICD-10-CM | POA: Diagnosis not present

## 2015-06-05 DIAGNOSIS — I6350 Cerebral infarction due to unspecified occlusion or stenosis of unspecified cerebral artery: Secondary | ICD-10-CM | POA: Diagnosis not present

## 2015-06-05 DIAGNOSIS — N183 Chronic kidney disease, stage 3 (moderate): Secondary | ICD-10-CM | POA: Diagnosis not present

## 2015-06-05 DIAGNOSIS — J449 Chronic obstructive pulmonary disease, unspecified: Secondary | ICD-10-CM | POA: Diagnosis not present

## 2015-06-05 DIAGNOSIS — I69991 Dysphagia following unspecified cerebrovascular disease: Secondary | ICD-10-CM | POA: Diagnosis not present

## 2015-06-05 DIAGNOSIS — M6281 Muscle weakness (generalized): Secondary | ICD-10-CM | POA: Diagnosis not present

## 2015-06-05 DIAGNOSIS — R260 Ataxic gait: Secondary | ICD-10-CM | POA: Diagnosis not present

## 2015-06-05 DIAGNOSIS — E875 Hyperkalemia: Secondary | ICD-10-CM | POA: Diagnosis not present

## 2015-06-05 DIAGNOSIS — E785 Hyperlipidemia, unspecified: Secondary | ICD-10-CM | POA: Diagnosis not present

## 2015-06-05 DIAGNOSIS — E669 Obesity, unspecified: Secondary | ICD-10-CM | POA: Diagnosis not present

## 2015-06-05 DIAGNOSIS — G3184 Mild cognitive impairment, so stated: Secondary | ICD-10-CM | POA: Diagnosis not present

## 2015-06-05 DIAGNOSIS — R410 Disorientation, unspecified: Secondary | ICD-10-CM | POA: Diagnosis not present

## 2015-06-05 DIAGNOSIS — E1121 Type 2 diabetes mellitus with diabetic nephropathy: Secondary | ICD-10-CM | POA: Diagnosis not present

## 2015-06-05 DIAGNOSIS — G4733 Obstructive sleep apnea (adult) (pediatric): Secondary | ICD-10-CM | POA: Diagnosis not present

## 2015-06-05 DIAGNOSIS — R278 Other lack of coordination: Secondary | ICD-10-CM | POA: Diagnosis not present

## 2015-06-05 DIAGNOSIS — G8191 Hemiplegia, unspecified affecting right dominant side: Secondary | ICD-10-CM | POA: Diagnosis not present

## 2015-06-05 DIAGNOSIS — I10 Essential (primary) hypertension: Secondary | ICD-10-CM | POA: Diagnosis not present

## 2015-06-05 DIAGNOSIS — G8929 Other chronic pain: Secondary | ICD-10-CM | POA: Diagnosis not present

## 2015-06-05 LAB — CBC
HEMATOCRIT: 31.3 % — AB (ref 39.0–52.0)
HEMOGLOBIN: 9.8 g/dL — AB (ref 13.0–17.0)
MCH: 27.5 pg (ref 26.0–34.0)
MCHC: 31.3 g/dL (ref 30.0–36.0)
MCV: 87.9 fL (ref 78.0–100.0)
Platelets: 322 10*3/uL (ref 150–400)
RBC: 3.56 MIL/uL — AB (ref 4.22–5.81)
RDW: 15.2 % (ref 11.5–15.5)
WBC: 10.8 10*3/uL — ABNORMAL HIGH (ref 4.0–10.5)

## 2015-06-05 LAB — BASIC METABOLIC PANEL
ANION GAP: 7 (ref 5–15)
BUN: 34 mg/dL — ABNORMAL HIGH (ref 6–20)
CALCIUM: 9.5 mg/dL (ref 8.9–10.3)
CO2: 34 mmol/L — AB (ref 22–32)
Chloride: 102 mmol/L (ref 101–111)
Creatinine, Ser: 1.53 mg/dL — ABNORMAL HIGH (ref 0.61–1.24)
GFR, EST AFRICAN AMERICAN: 50 mL/min — AB (ref >60)
GFR, EST NON AFRICAN AMERICAN: 43 mL/min — AB (ref >60)
GLUCOSE: 128 mg/dL — AB (ref 65–99)
POTASSIUM: 5.3 mmol/L — AB (ref 3.5–5.1)
Sodium: 143 mmol/L (ref 135–145)

## 2015-06-05 LAB — GLUCOSE, CAPILLARY
GLUCOSE-CAPILLARY: 120 mg/dL — AB (ref 65–99)
GLUCOSE-CAPILLARY: 99 mg/dL (ref 65–99)
Glucose-Capillary: 172 mg/dL — ABNORMAL HIGH (ref 65–99)

## 2015-06-05 LAB — HEMOGLOBIN A1C
HEMOGLOBIN A1C: 6.9 % — AB (ref 4.8–5.6)
MEAN PLASMA GLUCOSE: 151 mg/dL

## 2015-06-05 MED ORDER — SODIUM POLYSTYRENE SULFONATE 15 GM/60ML PO SUSP
30.0000 g | Freq: Once | ORAL | Status: AC
  Administered 2015-06-05: 30 g via ORAL
  Filled 2015-06-05: qty 120

## 2015-06-05 NOTE — Evaluation (Signed)
Physical Therapy Evaluation Patient Details Name: [name] MRN: [id number] DOB: [birth year] Today's Date: 06/05/2015   History of Present Illness  This pt is a 74 year old male admitted with hyperkalemia from [facility].  He has been there for rehab following a stroke which has caused right hemiparesis.  He is hoping to go home soon.  Clinical Impression  Pt was seen for evaluation.  He was very alert and oriented, pleasant and able to follow directions.  He has made astounding progress since last seen here in the hospital 2 weeks ago.  He now has active function of all major muscle groups of the right extremities.  He was able to transfer to EOB indpendently (HOB fully elevated), and was able to stand to a walker with close guarding.  He was able to slowly ambulate 20' with a rolling walker with close guarding.  His gait is very narrow and needs continual cueing to abduct the RLE during swing through.  He has excellent potential to continue to improve and needs to return to SNF at d/c.  He is very agreeable to this.    Follow Up Recommendations SNF    Equipment Recommendations  None recommended by PT    Recommendations for Other Services OT consult     Precautions / Restrictions Precautions Precautions: Fall Restrictions Weight Bearing Restrictions: No      Mobility  Bed Mobility Overal bed mobility: Modified Independent Bed Mobility: Supine to Sit     Supine to sit: HOB elevated;Modified independent (Device/Increase time)        Transfers Overall transfer level: Needs assistance Equipment used: Rolling walker (2 wheeled) Transfers: Sit to/from Stand Sit to Stand: Min guard            Ambulation/Gait Ambulation/Gait assistance: Min guard Ambulation Distance (Feet): 20 Feet Assistive device: Rolling walker (2 wheeled) Gait Pattern/deviations: Narrow base of support;Decreased stride length;Decreased weight shift to right   Gait velocity interpretation: <1.8  ft/sec, indicative of risk for recurrent falls    Stairs            Wheelchair Mobility    Modified Rankin (Stroke Patients Only)       Balance Overall balance assessment: Needs assistance Sitting-balance support: No upper extremity supported;Feet supported Sitting balance-Leahy Scale: Good     Standing balance support: Bilateral upper extremity supported Standing balance-Leahy Scale: Fair                               Pertinent Vitals/Pain Pain Assessment: No/denies pain    Home Living Family/patient expects to be discharged to:: Skilled nursing facility Living Arrangements: Spouse/significant other Available Help at Discharge: Family Type of Home: House                Prior Function Level of Independence: Independent with assistive device(s)               Hand Dominance   Dominant Hand: Left    Extremity/Trunk Assessment               Lower Extremity Assessment: RLE deficits/detail RLE Deficits / Details: strength generally 3/5 to 3+/5 in all major muscle groups    Cervical / Trunk Assessment: Kyphotic  Communication   Communication: Expressive difficulties (mild/mod slurring of speech)  Cognition Arousal/Alertness: Awake/alert Behavior During Therapy: WFL for tasks assessed/performed Overall Cognitive Status: Within Functional Limits for tasks assessed  General Comments      Exercises        Assessment/Plan    PT Assessment All further PT needs can be met in the next venue of care  PT Diagnosis Hemiplegia non-dominant side   PT Problem List Decreased strength;Decreased balance;Decreased activity tolerance;Decreased mobility;Obesity  PT Treatment Interventions     PT Goals (Current goals can be found in the Care Plan section) Acute Rehab PT Goals PT Goal Formulation: All assessment and education complete, DC therapy    Frequency     Barriers to discharge        Co-evaluation                End of Session Equipment Utilized During Treatment: Gait belt Activity Tolerance: Patient tolerated treatment well Patient left: in chair;with call bell/phone within reach;with chair alarm set Nurse Communication: Mobility status         Time: 3837-7939 PT Time Calculation (min) (ACUTE ONLY): 49 min   Charges:   PT Evaluation $PT Eval Moderate Complexity: 1 Procedure     PT G CodesDemetrios Isaacs L  PT 06/05/2015, 1:46 PM [phone]

## 2015-06-05 NOTE — Clinical Social Work Note (Addendum)
Pt d/c today back to [facility]. Per [facility]), pt will require re-authorization. PT recommends return to SNF. [facility] notified and will complete authorization. Facility aware. Pt reports no need to notify family as he has already done so.   Per [facility] case manager, authorization will be complete by 5:00 this evening. Facility admissions notified and agreeable to return.  Benay Pike, [place]

## 2015-06-05 NOTE — Progress Notes (Signed)
Report called to [facility] at [facility] this afternoon.  Called to verify that pt ready to go back, per nurse he was ok to come back.  Pt was transported with belongings and family at side via wheelchair to room 121.

## 2015-06-05 NOTE — Discharge Summary (Signed)
Physician Discharge Summary  [name] [id number] DOB: 06-30-1940 DOA: 06/04/2015  PCP: Tula Nakayama, MD  Admit date: 06/04/2015 Discharge date: 06/05/2015  Time spent: 45 minutes  Recommendations for Outpatient Follow-up:  -We'll be discharged back to SNF today.   Discharge Diagnoses:  Principal Problem:   Hyperkalemia Active Problems:   Diabetes mellitus, insulin dependent (IDDM), uncontrolled (HCC)   Hyperlipidemia with target LDL less than 70   Essential hypertension   CVA (cerebral infarction)   ARF (acute renal failure) (HCC)   CKD (chronic kidney disease) stage 2, GFR 60-89 ml/min   Hypothyroidism   Discharge Condition: Stable and improved  Filed Weights   06/04/15 0850 06/04/15 1602  Weight: 105.235 kg (232 lb) 101.923 kg (224 lb 11.2 oz)    History of present illness:  Patient is a 75 year old man who was sent from his SNF with complaints of hyperkalemia. It appears his potassium was 6.3. He was sent to the emergency department where he was given calcium gluconate, bicarbonate, insulin with D5, Kayexalate. Despite this potassium on subsequent measurement increased to 7.4 and he was referred for admission for further evaluation and management.   Hospital Course:   Hyperkalemia -Potassium is decreased to 5.3 from 7.4 in admission after administration of bicarbonate, Kayexalate, insulin. -Suspect cause is spironolactone and will discontinue this medication indefinitely. -Has been given an extra dose of Kayexalate 30 g prior to discharge.  Hypothyroidism -Check TSH, continue home dose of Synthroid  Acute on chronic kidney disease stage II  -Baseline creatinine is around 1.2, current creatinine is 1.4, give IV fluids.  Hypertension -Well-controlled, continue home medications  Insulin-dependent diabetes mellitus -Well-controlled  Procedures:  None   Consultations:  None  Discharge Instructions  Discharge Instructions    Diet - low  sodium heart healthy    Complete by:  As directed      Increase activity slowly    Complete by:  As directed             Medication List    STOP taking these medications        spironolactone 25 MG tablet  Commonly known as:  ALDACTONE      TAKE these medications        acetaminophen 500 MG tablet  Commonly known as:  TYLENOL  Take 500 mg by mouth every 6 (six) hours as needed for moderate pain (left thigh).     albuterol 108 (90 Base) MCG/ACT inhaler  Commonly known as:  PROVENTIL HFA;VENTOLIN HFA  Inhale 1-2 puffs into the lungs every 6 (six) hours as needed for wheezing or shortness of breath.     allopurinol 300 MG tablet  Commonly known as:  ZYLOPRIM  Take 300 mg by mouth daily.     amLODipine 10 MG tablet  Commonly known as:  NORVASC  Take 1 tablet (10 mg total) by mouth daily.     aspirin 325 MG tablet  Take 1 tablet (325 mg total) by mouth daily.     clopidogrel 75 MG tablet  Commonly known as:  PLAVIX  Take 1 tablet (75 mg total) by mouth daily.     hydroxypropyl cellulose 5 MG Inst  Apply 5 mg to eye daily.     levETIRAcetam 500 MG tablet  Commonly known as:  KEPPRA  Take 1 tablet (500 mg total) by mouth 2 (two) times daily.     levothyroxine 75 MCG tablet  Commonly known as:  SYNTHROID, LEVOTHROID  Take 75 mcg  by mouth every morning.     metFORMIN 500 MG tablet  Commonly known as:  GLUCOPHAGE  Take 1 tablet (500 mg total) by mouth 2 (two) times daily.     metoprolol 50 MG tablet  Commonly known as:  LOPRESSOR  Take 0.5 tablets (25 mg total) by mouth 2 (two) times daily.     pantoprazole 40 MG tablet  Commonly known as:  PROTONIX  Take 1 tablet (40 mg total) by mouth daily before breakfast.     pravastatin 40 MG tablet  Commonly known as:  PRAVACHOL  Take 2 tablets (80 mg total) by mouth daily.     RESOURCE THICKENUP CLEAR Powd  As needed     senna 8.6 MG tablet  Commonly known as:  SENOKOT  Take 1 tablet by mouth daily.     vitamin  B-12 1000 MCG tablet  Commonly known as:  CYANOCOBALAMIN  Take 1,000 mcg by mouth daily.       No Known Allergies    The results of significant diagnostics from this hospitalization (including imaging, microbiology, ancillary and laboratory) are listed below for reference.    Significant Diagnostic Studies: Ct Angio Head W/cm &/or Wo Cm  05/15/2015  CLINICAL DATA:  Worsening stroke symptoms with aphasia and right-sided weakness. Recent discharge for same. EXAM: CT ANGIOGRAPHY HEAD AND NECK TECHNIQUE: Multidetector CT imaging of the head and neck was performed using the standard protocol during bolus administration of intravenous contrast. Multiplanar CT image reconstructions and MIPs were obtained to evaluate the vascular anatomy. Carotid stenosis measurements (when applicable) are obtained utilizing NASCET criteria, using the distal internal carotid diameter as the denominator. CONTRAST:  81mL OMNIPAQUE IOHEXOL 350 MG/ML SOLN COMPARISON:  None. FINDINGS: CT HEAD Calvarium and skull base: No fracture or destructive lesion. Mastoids and middle ears are grossly clear. Paranasal sinuses: Imaged portions are clear. Orbits: Negative. Brain: LEFT paramedian pontine infarction is much easier to visualize on MR than CT. No evidence for hemorrhagic transformation of such. Mild atrophy consistent with patient's age. Hypoattenuation of white matter, representing small vessel disease. Chronic BILATERAL thalamic and deep white matter lacunar infarcts. CTA NECK Aortic arch: Common origin to the innominate and LEFT common carotid artery. Transverse arch calcification and mural thrombus. Moderately heavy calcific plaque at the origin LEFT subclavian but non stenotic. Imaged portion shows no evidence of aneurysm or dissection. No significant stenosis of the major arch vessel origins. Right carotid system: Only mild non stenotic calcification at the RIGHT ICA origin. Extreme dolichoectasia with non stenotic cervical  loop. Eccentric cervical ICA calcification at also non stenotic No evidence of dissection, stenosis (50% or greater) or occlusion. Left carotid system: Mildly chunky, but non stenotic, LEFT ICA calcific plaque. No measurable stenosis. Dolichoectatic cervical ICA with eccentric calcifications, also above stenotic. No evidence of dissection, stenosis (50% or greater) or occlusion. Vertebral arteries: LEFT vertebral may be slightly larger. No evidence of dissection, or occlusion. RIGHT vertebral origin stenosis estimated 50%. Both vessels are continuous through the neck. Nonvascular soft tissues: No lung apex nodule. Mild apical pleural thickening is greater on the RIGHT. Advanced cervical spondylosis. No sinus or mastoid disease of significance. Negative orbits. CTA HEAD Anterior circulation: Moderately advanced calcific plaque in the BILATERAL cavernous ICAs estimated 50-75%. This is borderline flow reducing. No proximal occlusion, aneurysm, or vascular malformation. No significant A1, M1, or medium-sized MCA branch stenosis or occlusion. Posterior circulation: Mild BILATERAL eccentric calcification of the distal vertebral artery V4 segments without focal stenosis. Mild non  stenotic irregularity of the basilar artery in its midportion with some small areas of eccentric calcific plaque. No significant stenosis, proximal occlusion, aneurysm, or vascular malformation. No significant PCA disease. Venous sinuses: As permitted by contrast timing, patent. Anatomic variants: None of significance. Delayed phase:   No abnormal intracranial enhancement. IMPRESSION: Moderately advanced calcific plaque in the BILATERAL cavernous ICAs, estimated 50-75%. Otherwise no flow reducing anterior circulation disease. Minor atheromatous change of the distal vertebral arteries and mid basilar artery, without flow reducing lesion or dissection to account for the observed acute LEFT pontine infarct. Difficult to visualize LEFT pontine infarct  on CT. No visible hemorrhagic transformation. No abnormal postcontrast enhancement. Electronically Signed   By: Staci Righter M.D.   On: 05/15/2015 15:00   Dg Ribs Unilateral W/chest Right  06/01/2015  CLINICAL DATA:  Pain following fall EXAM: RIGHT RIBS AND CHEST - 3+ VIEW COMPARISON:  Chest radiograph May 15, 2015 FINDINGS: Frontal chest as well as oblique and cone-down lower rib images were obtained. The lungs are clear. The heart size and pulmonary vascularity are normal. No adenopathy. No pneumothorax or pleural effusion. There is a displaced fracture of the anterior right eleventh rib. No other fracture evident. IMPRESSION: Mildly displaced fracture of the anterior right eleventh rib. No other fracture evident. No pneumothorax or effusion. Lungs clear. These results will be called to the ordering clinician or representative by the Radiologist Assistant, and communication documented in the PACS or zVision Dashboard. Electronically Signed   By: Lowella Grip III M.D.   On: 06/01/2015 21:45   Dg Thoracic Spine W/swimmers  06/01/2015  CLINICAL DATA:  Acute thoracic spine pain after fall 2 days ago. Initial encounter. EXAM: THORACIC SPINE - 3 VIEWS COMPARISON:  None. FINDINGS: There is no evidence of thoracic spine fracture. Alignment is normal. No other significant bone abnormalities are identified. IMPRESSION: Normal thoracic spine. Electronically Signed   By: Marijo Conception, M.D.   On: 06/01/2015 19:53   Dg Lumbar Spine Complete  06/01/2015  CLINICAL DATA:  Acute lower back pain after fall 2 days ago. Initial encounter. EXAM: LUMBAR SPINE - COMPLETE 4+ VIEW COMPARISON:  CT scan of November 15, 2009. FINDINGS: No fracture or significant spondylolisthesis is noted. Moderate degenerative disc disease is noted at L2-3, L3-4 and L4-5 with anterior and posterior osteophyte formation. Atherosclerosis of abdominal aorta is noted. IMPRESSION: Moderate multilevel degenerative disc disease. No acute  abnormality seen in the lumbar spine. Electronically Signed   By: Marijo Conception, M.D.   On: 06/01/2015 19:55   Dg Abd 1 View  05/18/2015  CLINICAL DATA:  Constipation. No bowel movements since December 21st. EXAM: ABDOMEN - 1 VIEW COMPARISON:  12/07/2009 FINDINGS: Two supine views. Normal caliber stomach. No gaseous distention of small bowel loops. Contrast throughout normal caliber colon. Moderate amount of ascending colonic stool. Distal gas and stool. Limited evaluation for abdominal calcifications, secondary to enteric contrast. IMPRESSION: No bowel obstruction. Moderate amount of ascending colonic stool. Retained contrast throughout the colon. Electronically Signed   By: Abigail Miyamoto M.D.   On: 05/18/2015 20:19   Ct Head Wo Contrast  05/15/2015  CLINICAL DATA:  Code stroke. Stroke-like symptoms. Speaking in full sentences. EXAM: CT HEAD WITHOUT CONTRAST TECHNIQUE: Contiguous axial images were obtained from the base of the skull through the vertex without intravenous contrast. COMPARISON:  05/14/2015 FINDINGS: There is no evidence of mass effect, midline shift, or extra-axial fluid collections. There is no evidence of a space-occupying lesion or intracranial hemorrhage.  There is no evidence of a cortical-based area of acute infarction. Recently demonstrated left pontine infarct on MRI dated 05/10/2015 is not well appreciated on the current exam. There is generalized cerebral atrophy. There is periventricular white matter low attenuation likely secondary to microangiopathy. The ventricles and sulci are appropriate for the patient's age. The basal cisterns are patent. Visualized portions of the orbits are unremarkable. The visualized portions of the paranasal sinuses and mastoid air cells are unremarkable. Cerebrovascular atherosclerotic calcifications are noted. The osseous structures are unremarkable. IMPRESSION: 1. No acute intracranial pathology. 2. Subacute left pontine infarct is not well  demonstrated on the current examination. No hemorrhagic transformation. Electronically Signed   By: Kathreen Devoid   On: 05/15/2015 12:24   Ct Head Wo Contrast  05/14/2015  CLINICAL DATA:  Recent history of a stroke, continue right-sided weakness. EXAM: CT HEAD WITHOUT CONTRAST TECHNIQUE: Contiguous axial images were obtained from the base of the skull through the vertex without intravenous contrast. COMPARISON:  May 10, 2015 CT head, MR brain May 10, 2015 FINDINGS: The previous MR noted left pontine acute infarct is not as well appreciated on the CT scan. There is no hemorrhagic transformation. There is no midline shift, hydrocephalus. There is old lacune infarction of the left basal ganglia, unchanged. No new infarcts are identified. The bony calvarium is intact. The visualized sinuses are clear. IMPRESSION: Previous MRI dated left pontine acute infarct is not as well appreciated on CT scan. There is no hemorrhagic transformation. No new infarcts are identified. Electronically Signed   By: Abelardo Diesel M.D.   On: 05/14/2015 08:31   Ct Head Wo Contrast  05/10/2015  CLINICAL DATA:  75 year old male with right-sided weakness since started yesterday. EXAM: CT HEAD WITHOUT CONTRAST TECHNIQUE: Contiguous axial images were obtained from the base of the skull through the vertex without intravenous contrast. COMPARISON:  Head CT dated 06/23/2014 FINDINGS: There is slight prominence of the ventricles and sulci compatible with age-related volume loss. Mild periventricular and deep white matter hypodensities represent chronic microvascular ischemic changes. Small left basal ganglia old lacunar infarct. There is no intracranial hemorrhage. No mass effect or midline shift identified. The visualized paranasal sinuses and mastoid air cells are well aerated. The calvarium is intact. IMPRESSION: No acute intracranial hemorrhage. Mild age-related atrophy and chronic microvascular ischemic disease. If symptoms  persist and there are no contraindications, MRI may provide better evaluation if clinically indicated Electronically Signed   By: Anner Crete M.D.   On: 05/10/2015 01:12   Ct Angio Neck W/cm &/or Wo/cm  05/15/2015  CLINICAL DATA:  Worsening stroke symptoms with aphasia and right-sided weakness. Recent discharge for same. EXAM: CT ANGIOGRAPHY HEAD AND NECK TECHNIQUE: Multidetector CT imaging of the head and neck was performed using the standard protocol during bolus administration of intravenous contrast. Multiplanar CT image reconstructions and MIPs were obtained to evaluate the vascular anatomy. Carotid stenosis measurements (when applicable) are obtained utilizing NASCET criteria, using the distal internal carotid diameter as the denominator. CONTRAST:  55mL OMNIPAQUE IOHEXOL 350 MG/ML SOLN COMPARISON:  None. FINDINGS: CT HEAD Calvarium and skull base: No fracture or destructive lesion. Mastoids and middle ears are grossly clear. Paranasal sinuses: Imaged portions are clear. Orbits: Negative. Brain: LEFT paramedian pontine infarction is much easier to visualize on MR than CT. No evidence for hemorrhagic transformation of such. Mild atrophy consistent with patient's age. Hypoattenuation of white matter, representing small vessel disease. Chronic BILATERAL thalamic and deep white matter lacunar infarcts. CTA NECK Aortic arch:  Common origin to the innominate and LEFT common carotid artery. Transverse arch calcification and mural thrombus. Moderately heavy calcific plaque at the origin LEFT subclavian but non stenotic. Imaged portion shows no evidence of aneurysm or dissection. No significant stenosis of the major arch vessel origins. Right carotid system: Only mild non stenotic calcification at the RIGHT ICA origin. Extreme dolichoectasia with non stenotic cervical loop. Eccentric cervical ICA calcification at also non stenotic No evidence of dissection, stenosis (50% or greater) or occlusion. Left carotid  system: Mildly chunky, but non stenotic, LEFT ICA calcific plaque. No measurable stenosis. Dolichoectatic cervical ICA with eccentric calcifications, also above stenotic. No evidence of dissection, stenosis (50% or greater) or occlusion. Vertebral arteries: LEFT vertebral may be slightly larger. No evidence of dissection, or occlusion. RIGHT vertebral origin stenosis estimated 50%. Both vessels are continuous through the neck. Nonvascular soft tissues: No lung apex nodule. Mild apical pleural thickening is greater on the RIGHT. Advanced cervical spondylosis. No sinus or mastoid disease of significance. Negative orbits. CTA HEAD Anterior circulation: Moderately advanced calcific plaque in the BILATERAL cavernous ICAs estimated 50-75%. This is borderline flow reducing. No proximal occlusion, aneurysm, or vascular malformation. No significant A1, M1, or medium-sized MCA branch stenosis or occlusion. Posterior circulation: Mild BILATERAL eccentric calcification of the distal vertebral artery V4 segments without focal stenosis. Mild non stenotic irregularity of the basilar artery in its midportion with some small areas of eccentric calcific plaque. No significant stenosis, proximal occlusion, aneurysm, or vascular malformation. No significant PCA disease. Venous sinuses: As permitted by contrast timing, patent. Anatomic variants: None of significance. Delayed phase:   No abnormal intracranial enhancement. IMPRESSION: Moderately advanced calcific plaque in the BILATERAL cavernous ICAs, estimated 50-75%. Otherwise no flow reducing anterior circulation disease. Minor atheromatous change of the distal vertebral arteries and mid basilar artery, without flow reducing lesion or dissection to account for the observed acute LEFT pontine infarct. Difficult to visualize LEFT pontine infarct on CT. No visible hemorrhagic transformation. No abnormal postcontrast enhancement. Electronically Signed   By: Staci Righter M.D.   On:  05/15/2015 15:00   Mr Virgel Paling Wo Contrast  05/10/2015  CLINICAL DATA:  75 year old male with right side weakness for 2 days. Initial encounter. EXAM: MRI HEAD WITHOUT CONTRAST MRA HEAD WITHOUT CONTRAST TECHNIQUE: Multiplanar, multiecho pulse sequences of the brain and surrounding structures were obtained without intravenous contrast. Angiographic images of the head were obtained using MRA technique without contrast. COMPARISON:  Noncontrast head CT 0026 hours today. FINDINGS: MRI HEAD FINDINGS Linear 10 mm area of restricted diffusion in the left paracentral pons (series 100, image 12) associated with T2 and FLAIR hyperintensity. No associated hemorrhage or mass effect. Major intracranial vascular flow voids are preserved, with generalized intracranial artery dolichoectasia. No other restricted diffusion. Chronic lacunar infarcts in the bilateral thalami. Chronic lacunar infarct in the left external capsule. No cortical encephalomalacia. Comparatively mild patchy mostly periatrial cerebral white matter T2 and FLAIR hyperintensity. No chronic cerebral blood products identified. No midline shift, mass effect, evidence of mass lesion, ventriculomegaly, extra-axial collection or acute intracranial hemorrhage. Cervicomedullary junction and pituitary are within normal limits. Negative visualized cervical spine. Visible internal auditory structures appear normal. Paranasal sinuses and mastoids are clear. Negative orbit and scalp soft tissues. MRA HEAD FINDINGS Antegrade flow in both distal vertebral arteries which appear codominant. Irregularity but no distal vertebral artery stenosis. Both PICA origins remain patent. Patent vertebrobasilar junction. Basilar irregularity but no stenosis. SCA, AICA, SCA, and PCA origins are patent. Posterior communicating  arteries are diminutive or absent. There is mild irregularity in the distal PCA P2 segments with preserved distal flow. Antegrade flow in both ICA siphons with  irregularity maximal in the distal cavernous segments and anterior genu regions greater on the left. Up to moderate left anterior genu stenosis. Still, both carotid termini remain patent. Bilateral MCA and ACA origins are within normal limits. Tortuous A1 and M1 segments. Anterior communicating artery and visualized ACA branches are within normal limits, tortuous A2 segments. MCA M1 segments and bifurcations are patent with no stenosis identified. Visualized bilateral MCA branches are within normal limits. IMPRESSION: 1. Acute lacunar infarct in the left paracentral pons, basilar artery perforator territory. No associated hemorrhage or mass effect. 2. Chronic small vessel ischemia in the deep gray matter nuclei, primarily the thalamostriate artery territory. 3. Intracranial artery dolichoectasia. No posterior circulation stenosis. Moderate stenosis of the left ICA siphon. No major circle of Willis branch occlusion. Electronically Signed   By: Genevie [name] M.D.   On: 05/10/2015 12:12   Mr Brain Wo Contrast  05/15/2015  CLINICAL DATA:  75 year old male with right side weakness, slurred speech. Recent Left brainstem infarct on the 22nd. Initial encounter. EXAM: MRI HEAD WITHOUT CONTRAST TECHNIQUE: Multiplanar, multiecho pulse sequences of the brain and surrounding structures were obtained without intravenous contrast. COMPARISON:  Head CT without contrast 1212 hours today and earlier. Brain MRI 05/10/2015. FINDINGS: The left paracentral brainstem infarct has extended dorsally since 05/10/2015, but otherwise affects the same vascular territory as before. See series 1, image 10 and compared to series 100, image 11 previously). Associated T2 and FLAIR hyperintensity. No associated acute hemorrhage or mass effect. Major intracranial vascular flow voids are stable. No new area of restricted diffusion. Elsewhere stable gray and white matter signal elsewhere including multifocal chronic thalamic and posterior white matter  capsule lacunar infarcts. Chronic micro hemorrhage in the right paracentral pons. No intracranial mass effect. No ventriculomegaly, extra-axial collection or acute intracranial hemorrhage. Cervicomedullary junction and pituitary are within normal limits. Negative visualized cervical spine. Normal bone marrow signal. Visualized internal auditory structures, mastoids, paranasal sinuses, orbits soft tissues and scalp soft tissues are stable. IMPRESSION: 1. Dorsal extension of the left paracentral pontine infarct since the 22nd. No associated hemorrhage or mass effect. 2. No new intracranial abnormality. Electronically Signed   By: Genevie [name] M.D.   On: 05/15/2015 14:09   Mr Brain Wo Contrast  05/10/2015  CLINICAL DATA:  75 year old male with right side weakness for 2 days. Initial encounter. EXAM: MRI HEAD WITHOUT CONTRAST MRA HEAD WITHOUT CONTRAST TECHNIQUE: Multiplanar, multiecho pulse sequences of the brain and surrounding structures were obtained without intravenous contrast. Angiographic images of the head were obtained using MRA technique without contrast. COMPARISON:  Noncontrast head CT 0026 hours today. FINDINGS: MRI HEAD FINDINGS Linear 10 mm area of restricted diffusion in the left paracentral pons (series 100, image 12) associated with T2 and FLAIR hyperintensity. No associated hemorrhage or mass effect. Major intracranial vascular flow voids are preserved, with generalized intracranial artery dolichoectasia. No other restricted diffusion. Chronic lacunar infarcts in the bilateral thalami. Chronic lacunar infarct in the left external capsule. No cortical encephalomalacia. Comparatively mild patchy mostly periatrial cerebral white matter T2 and FLAIR hyperintensity. No chronic cerebral blood products identified. No midline shift, mass effect, evidence of mass lesion, ventriculomegaly, extra-axial collection or acute intracranial hemorrhage. Cervicomedullary junction and pituitary are within normal limits.  Negative visualized cervical spine. Visible internal auditory structures appear normal. Paranasal sinuses and mastoids are clear. Negative  orbit and scalp soft tissues. MRA HEAD FINDINGS Antegrade flow in both distal vertebral arteries which appear codominant. Irregularity but no distal vertebral artery stenosis. Both PICA origins remain patent. Patent vertebrobasilar junction. Basilar irregularity but no stenosis. SCA, AICA, SCA, and PCA origins are patent. Posterior communicating arteries are diminutive or absent. There is mild irregularity in the distal PCA P2 segments with preserved distal flow. Antegrade flow in both ICA siphons with irregularity maximal in the distal cavernous segments and anterior genu regions greater on the left. Up to moderate left anterior genu stenosis. Still, both carotid termini remain patent. Bilateral MCA and ACA origins are within normal limits. Tortuous A1 and M1 segments. Anterior communicating artery and visualized ACA branches are within normal limits, tortuous A2 segments. MCA M1 segments and bifurcations are patent with no stenosis identified. Visualized bilateral MCA branches are within normal limits. IMPRESSION: 1. Acute lacunar infarct in the left paracentral pons, basilar artery perforator territory. No associated hemorrhage or mass effect. 2. Chronic small vessel ischemia in the deep gray matter nuclei, primarily the thalamostriate artery territory. 3. Intracranial artery dolichoectasia. No posterior circulation stenosis. Moderate stenosis of the left ICA siphon. No major circle of Willis branch occlusion. Electronically Signed   By: Genevie [name] M.D.   On: 05/10/2015 12:12   US Carotid Bilateral  05/12/2015  CLINICAL DATA:  Acute left pontine infarct. EXAM: BILATERAL CAROTID DUPLEX ULTRASOUND TECHNIQUE: Pearline Cables scale imaging, color Doppler and duplex ultrasound were performed of bilateral carotid and vertebral arteries in the neck. COMPARISON:  05/09/2001 FINDINGS: Criteria:  Quantification of carotid stenosis is based on velocity parameters that correlate the residual internal carotid diameter with NASCET-based stenosis levels, using the diameter of the distal internal carotid lumen as the denominator for stenosis measurement. The following velocity measurements were obtained: RIGHT ICA:  103/27 cm/sec CCA:  [id number] cm/sec SYSTOLIC ICA/CCA RATIO:  123456 DIASTOLIC ICA/CCA RATIO:  123456 ECA:  204 cm/sec LEFT ICA:  98/25 cm/sec CCA:  [id number] cm/sec SYSTOLIC ICA/CCA RATIO:  99991111 DIASTOLIC ICA/CCA RATIO:  2.3 ECA:  107 cm/sec RIGHT CAROTID ARTERY: Minor echogenic shadowing plaque formation. No hemodynamically significant right ICA stenosis, velocity elevation, or turbulent flow. Degree of narrowing less than 50%. RIGHT VERTEBRAL ARTERY:  Antegrade LEFT CAROTID ARTERY: Similar scattered minor echogenic plaque formation. No hemodynamically significant left ICA stenosis, velocity elevation, or turbulent flow. LEFT VERTEBRAL ARTERY:  Antegrade IMPRESSION: Minor carotid atherosclerosis. No hemodynamically significant ICA stenosis. Degree of narrowing less than 50% bilaterally. Electronically Signed   By: Jerilynn Mages.  Shick M.D.   On: 05/12/2015 11:24   Nm Myocar Multi W/spect W/wall Motion / Ef  05/07/2015  CLINICAL DATA:  Diabetes, hypertension, previous myocardial infarction. EXAM: MYOCARDIAL IMAGING WITH SPECT (REST AND PHARMACOLOGIC-STRESS) GATED LEFT VENTRICULAR WALL MOTION STUDY LEFT VENTRICULAR EJECTION FRACTION TECHNIQUE: Standard myocardial SPECT imaging was performed after resting intravenous injection of 10 mCi Tc-102m sestamibi. Subsequently, intravenous infusion of Lexiscan was performed under the supervision of the Cardiology staff. At peak effect of the drug, 30 mCi Tc-83m sestamibi was injected intravenously and standard myocardial SPECT imaging was performed. Quantitative gated imaging was also performed to evaluate left ventricular wall motion, and estimate left ventricular ejection  fraction. COMPARISON:  08/24/2013 FINDINGS: Perfusion: Medium sized area of moderately decreased activity involving the inferior wall of the left ventricle extending to the inferoseptal region and inferoapical region. There is a moderate amount of subdiaphragmatic activity which degrades the study. No suggestion of reversible component to suggest ischemia. Wall Motion: Normal left ventricular  wall motion. No left ventricular dilation. Left Ventricular Ejection Fraction: 56 % End diastolic volume 96 ml End systolic volume 42 ml IMPRESSION: 1. No reversible ischemia.  Inferior wall attenuation or scar. 2. Normal left ventricular wall motion. 3. Left ventricular ejection fraction 56% (previously 58%). 4. LOW-risk stress test findings*. *2012 Appropriate Use Criteria for Coronary Revascularization Focused Update: J Am Coll Cardiol. [id number]. http://content.airportbarriers.com.aspx?articleid=[email or web address] Electronically Signed   By: Lucrezia Europe M.D.   On: 05/07/2015 14:33   Dg Chest Portable 1 View  05/15/2015  CLINICAL DATA:  One episode of aphagia this morning. Sudden onset of rt sided weakness and slurred speech this morning. Hx of stroke 05/09/15. Hx of copd. EXAM: PORTABLE CHEST 1 VIEW COMPARISON:  04/23/2015 FINDINGS: Midline trachea. Cardiomegaly accentuated by AP portable technique. No pleural effusion or pneumothorax. No congestive failure. Subsegmental atelectasis at both lung bases. IMPRESSION: Mild cardiomegaly, without acute disease. Electronically Signed   By: Abigail Miyamoto M.D.   On: 05/15/2015 15:42   Dg Swallowing Func-speech Pathology  05/10/2015  Ephraim Hamburger, CCC-SLP     05/10/2015  5:34 PM Objective Swallowing Evaluation: MBS-Modified Barium Swallow Study Patient Details Name: ALYAN LUEBKE MRN: [id number] Date of Birth: March 08, 1941 Today's Date: 05/10/2015 Time: SLP Start Time (ACUTE ONLY): 1613-SLP Stop Time (ACUTE ONLY): 1643 SLP Time Calculation (min) (ACUTE ONLY): 30 min Past  Medical History: Past Medical History Diagnosis Date . GERD (gastroesophageal reflux disease)  . CAD (coronary artery disease)  . Obesity  . Hyperlipidemia  . Hypertension  . Chronic back pain  . Gout  . Chronic bronchitis ([place])    "get it q yr" . Psoriasis  . COPD (chronic obstructive pulmonary disease) ([place])  . Myocardial infarction ([place]) 1999 . Pneumonia    "a few times; last time was ~ 06/2013" . On home oxygen therapy    "2L; only at night" (08/30/2013) . Diabetes mellitus, type 1  . Arthritis of knee  . Arthritis    "left leg" (08/30/2013) . Vitamin B12 deficiency 05/01/2015 . Abnormality of gait 05/01/2015 . Tussive syncope 05/01/2015 Past Surgical History: Past Surgical History Procedure Laterality Date . Appendectomy   . Colonoscopy  03/17/2012   Procedure: COLONOSCOPY;  Surgeon: Rogene Houston, MD;  Location: [facility] ENDO SUITE;  Service: Endoscopy;  Laterality: N/A;  830 . Cystoscopy with urethral dilatation N/A 11/26/2012   Procedure: CYSTOSCOPY WITH URETHRAL DILATATION;  Surgeon: Malka So, MD;  Location: [facility] ORS;  Service: Urology;  Laterality: N/A; . Coronary angioplasty with stent placement  1999- 2009-08/30/2013   "counting today's, I have 5 stents" (08/30/2013) . Left heart catheterization with coronary angiogram N/A 08/30/2013   Procedure: LEFT HEART CATHETERIZATION WITH CORONARY ANGIOGRAM;  Surgeon: Clent Demark, MD;  Location: [facility] CATH LAB;  Service: Cardiovascular;  Laterality: N/A; . Percutaneous coronary stent intervention (pci-s) Right 08/30/2013   Procedure: PERCUTANEOUS CORONARY STENT INTERVENTION (PCI-S);  Surgeon: Clent Demark, MD;  Location: [facility] CATH LAB;  Service: Cardiovascular;  Laterality: Right; HPI: Mr. Chau Saber is a 75 yo male who was admitted yesterday with symptoms consistent with stroke. MRI showed acute lacunar infarct in the left paracentral pons, basilar artery perforator territory. No associated hemorrage or mass effect; Chronic small vessle ischemia in the deep gray  matter nuclei, primarily the thalamostriate artery territory; Intracranial artery dolichoectasia. Moderate stenosis of the left ICA siphon. No major circle of Willis branch occlusion. Mr. Shambach is known to this SLP from previous hospitalization a few weeks ago for COPD exacerbation  and c/o of frequently choking during meals. A barium swallow was completed during that hospitalization which showed: Laryngeal penetration and aspiration of contrast with an additional episode of prolonged strong coughing when swallowing water with the barium tablet likely representing additional nonvisualized aspiration. MBSS recommended due to ongoing  c/o dysphagia now in setting of CVA. Subjective: Pt alert and cooperative, up in chair for MBSS Assessment / Plan / Recommendation CHL IP CLINICAL IMPRESSIONS 05/10/2015 Therapy Diagnosis WFL;Mild pharyngeal phase dysphagia Clinical Impression Mr. Botz demonstrated mild pharyngeal phase dysphagia characterized by premature spillage with delay in swallow initiation with thin liquids triggering after filling the pyriforms with cup and straw sips; mild post swallow residuals noted in the pyriforms and lateral channels which cleared with cued repeat/dry swallow. No penetration or aspiration observed. Swallow function WNL for solid textures, however pt with subjective report of difficulty with dry, crunchy foods which elicit cough response. Pt with history of COPD and was recently hospitalized for the same and barium swallow completed during that hospitalization demonstrated aspiration of barium contrast (breathing was compromised during that stay). Recommend D3/mech soft with thin liquids with use of aspiration and reflux precautions. Pt to take small bites/sips, swallow 2x for each bite/sip, and avoid talking during meals.  Impact on safety and function Mild aspiration risk   CHL IP TREATMENT RECOMMENDATION 05/10/2015 Treatment Recommendations No treatment recommended at this time    Prognosis 05/10/2015 Prognosis for Safe Diet Advancement Good Barriers to Reach Goals -- Barriers/Prognosis Comment h/o COPD CHL IP DIET RECOMMENDATION 05/10/2015 SLP Diet Recommendations Dysphagia 3 (Mech soft) solids;Thin liquid Liquid Administration via Cup Medication Administration Whole meds with liquid Compensations Slow rate;Small sips/bites;Multiple dry swallows after each bite/sip Postural Changes Remain semi-upright after after feeds/meals (Comment);Seated upright at 90 degrees   CHL IP OTHER RECOMMENDATIONS 05/10/2015 Recommended Consults -- Oral Care Recommendations Oral care BID Other Recommendations Clarify dietary restrictions   CHL IP FOLLOW UP RECOMMENDATIONS 05/10/2015 Follow up Recommendations None   No flowsheet data found.     CHL IP ORAL PHASE 05/10/2015 Oral Phase WFL Oral - Pudding Teaspoon -- Oral - Pudding Cup -- Oral - Honey Teaspoon -- Oral - Honey Cup -- Oral - Nectar Teaspoon -- Oral - Nectar Cup -- Oral - Nectar Straw -- Oral - Thin Teaspoon -- Oral - Thin Cup -- Oral - Thin Straw -- Oral - Puree -- Oral - Mech Soft -- Oral - Regular -- Oral - Multi-Consistency -- Oral - Pill -- Oral Phase - Comment --  CHL IP PHARYNGEAL PHASE 05/10/2015 Pharyngeal Phase Impaired Pharyngeal- Pudding Teaspoon -- Pharyngeal -- Pharyngeal- Pudding Cup -- Pharyngeal -- Pharyngeal- Honey Teaspoon -- Pharyngeal -- Pharyngeal- Honey Cup -- Pharyngeal -- Pharyngeal- Nectar Teaspoon -- Pharyngeal -- Pharyngeal- Nectar Cup -- Pharyngeal -- Pharyngeal- Nectar Straw -- Pharyngeal -- Pharyngeal- Thin Teaspoon -- Pharyngeal -- Pharyngeal- Thin Cup Delayed swallow initiation-vallecula;Delayed swallow initiation-pyriform sinuses;Pharyngeal residue - pyriform Pharyngeal -- Pharyngeal- Thin Straw Delayed swallow initiation-pyriform sinuses;Pharyngeal residue - pyriform;Lateral channel residue Pharyngeal -- Pharyngeal- Puree WFL;Delayed swallow initiation-vallecula Pharyngeal -- Pharyngeal- Mechanical Soft -- Pharyngeal  -- Pharyngeal- Regular Delayed swallow initiation-vallecula;WFL Pharyngeal -- Pharyngeal- Multi-consistency -- Pharyngeal -- Pharyngeal- Pill WFL Pharyngeal -- Pharyngeal Comment --  CHL IP CERVICAL ESOPHAGEAL PHASE 05/10/2015 Cervical Esophageal Phase WFL Pudding Teaspoon -- Pudding Cup -- Honey Teaspoon -- Honey Cup -- Nectar Teaspoon -- Nectar Cup -- Nectar Straw -- Thin Teaspoon -- Thin Cup -- Thin Straw -- Puree -- Mechanical Soft -- Regular -- Multi-consistency -- Pill --  Cervical Esophageal Comment -- Thank you, Genene Churn, CCC-SLP [phone] [place] 05/10/2015, 5:34 PM              Dg Hips Bilat W Or W/o Pelvis Min 5 Views  06/01/2015  CLINICAL DATA:  Pain following fall 2 days prior EXAM: DG HIP (WITH OR WITHOUT PELVIS) 5+V BILAT COMPARISON:  None. FINDINGS: Frontal pelvis as well as frontal and lateral views of each hip-total five views -were obtained. There is no demonstrable fracture or dislocation. Joint spaces appear intact. No erosive change. There are scattered foci of arterial vascular calcification. Calcifications in the perineum probably represent small phleboliths. IMPRESSION: No demonstrable fracture or dislocation.  No apparent arthropathy. Electronically Signed   By: Lowella Grip III M.D.   On: 06/01/2015 21:49    Microbiology: No results found for this or any previous visit (from the past 240 hour(s)).   Labs: Basic Metabolic Panel:  Recent Labs Lab 06/04/15 0600 06/04/15 0900 06/04/15 1039 06/04/15 1943 06/05/15 0609  NA 138 137 137 141 143  K 6.8* 6.8* 7.4* 6.0* 5.3*  CL 105 102 102 102 102  CO2 29 29 29 30  34*  GLUCOSE 119* 122* 184* 137* 128*  BUN 37* 38* 37* 35* 34*  CREATININE 1.46* 1.54* 1.53* 1.49* 1.53*  CALCIUM 9.1 9.5 9.7 10.3 9.5   Liver Function Tests: No results for input(s): AST, ALT, ALKPHOS, BILITOT, PROT, ALBUMIN in the last 168 hours. No results for input(s): LIPASE, AMYLASE in the last 168 hours. No results for input(s):  AMMONIA in the last 168 hours. CBC:  Recent Labs Lab 06/04/15 0600 06/04/15 0900 06/05/15 0609  WBC 10.3 10.9* 10.8*  NEUTROABS 6.5 7.1  --   HGB 9.4* 10.0* 9.8*  HCT 29.5* 32.3* 31.3*  MCV 87.3 87.3 87.9  PLT 312 334 322   Cardiac Enzymes: No results for input(s): CKTOTAL, CKMB, CKMBINDEX, TROPONINI in the last 168 hours. BNP: BNP (last 3 results)  Recent Labs  04/18/15 2200  BNP 22.0    ProBNP (last 3 results) No results for input(s): PROBNP in the last 8760 hours.  CBG:  Recent Labs Lab 06/04/15 2039 06/05/15 0755 06/05/15 1149  GLUCAP 121* 120* 172*       Signed:  HERNANDEZ ACOSTA,ESTELA  [facility] Pager: [phone] 06/05/2015, 12:09 PM

## 2015-06-05 NOTE — Consult Note (Signed)
   [facility] CM Inpatient Consult   06/05/2015  KORAN SEABROOK 1941/03/08 [id number]   Met with the patient to follow up on his progress for discharge and patient continues to confirm his desire for [facility] [facility] services to continue upon discharge. Patient has been engaged by [employer] and [facility] LCSW for services. Patient aware that He will be followed at SNF by SW and when he discharged home will be followed by community RNCM.  Reminded that the services does not interfere with or replace any services set up by the inpatient care management team.  Inpatient CM made aware of [facility] involvement.  For questions, please contact: Royetta Crochet. Laymond Purser, RN, BSN, [facility] Liaison [phone]

## 2015-06-05 NOTE — Care Management Obs Status (Signed)
[employer] NOTIFICATION   Patient Details  Name: [name] MRN: [id number] Date of Birth: 10/29/1940   Medicare Observation Status Notification Given:  Yes    Alvie Heidelberg, RN 06/05/2015, 3:51 PM

## 2015-06-05 NOTE — Clinical Social Work Note (Signed)
Clinical Social Work Assessment  Patient Details  Name: [name] MRN: [id number] Date of Birth: September 11, 1940  Date of referral:  06/05/15               Reason for consult:  Discharge Planning                Permission sought to share information with:    Permission granted to share information::     Name::        Agency::     Relationship::     Contact Information:     Housing/Transportation Living arrangements for the past 2 months:  [place] of Information:  Patient Patient Interpreter Needed:  None Criminal Activity/Legal Involvement Pertinent to Current Situation/Hospitalization:  No - Comment as needed Significant Relationships:  Spouse Lives with:  Facility Resident Do you feel safe going back to the place where you live?  Yes Need for family participation in patient care:  Yes (Comment)  Care giving concerns:  Pt is resident at [facility].    Social Worker assessment / plan:  CSW met with pt at bedside. Pt alert and oriented and reports he has been a resident at [facility] for several weeks now. Family is involved and supportive. Pt indicates that PT is going well at facility and he feels he is making progress. He is agreeable to return to complete therapy prior to return home. CSW requests PT evaluation for re-authorization through [facility]. Per Tami at facility, pt is skilled level of care and okay to return. No FL2 needed.   Employment status:  Retired Nurse, adult PT Recommendations:  Not assessed at this time [place] / Referral to community resources:  [place]  Patient/Family's Response to care:  Pt requests return to [facility] when medically stable.   Patient/Family's Understanding of and Emotional Response to Diagnosis, Current Treatment, and Prognosis:  Pt reports understanding of treatment plan and is agreeable.   Emotional Assessment Appearance:  Appears stated age Attitude/Demeanor/Rapport:  Other  (Cooperative) Affect (typically observed):  Appropriate Orientation:  Oriented to Self, Oriented to Place, Oriented to  Time, Oriented to Situation Alcohol / Substance use:  Not Applicable Psych involvement (Current and /or in the community):  No (Comment)  Discharge Needs  Concerns to be addressed:  Discharge Planning Concerns Readmission within the last 30 days:  Yes Current discharge risk:  Physical Impairment Barriers to Discharge:  Continued Medical Work up   [employer], [employer], [place] 06/05/2015, 8:59 AM [phone]

## 2015-06-06 ENCOUNTER — Non-Acute Institutional Stay (SKILLED_NURSING_FACILITY): Payer: Commercial Managed Care - HMO | Admitting: Internal Medicine

## 2015-06-06 ENCOUNTER — Ambulatory Visit: Payer: Self-pay | Admitting: *Deleted

## 2015-06-06 DIAGNOSIS — N183 Chronic kidney disease, stage 3 unspecified: Secondary | ICD-10-CM

## 2015-06-06 DIAGNOSIS — E875 Hyperkalemia: Secondary | ICD-10-CM | POA: Diagnosis not present

## 2015-06-06 DIAGNOSIS — E1121 Type 2 diabetes mellitus with diabetic nephropathy: Secondary | ICD-10-CM

## 2015-06-06 DIAGNOSIS — I6350 Cerebral infarction due to unspecified occlusion or stenosis of unspecified cerebral artery: Secondary | ICD-10-CM | POA: Diagnosis not present

## 2015-06-06 DIAGNOSIS — I639 Cerebral infarction, unspecified: Secondary | ICD-10-CM

## 2015-06-08 ENCOUNTER — Encounter (HOSPITAL_COMMUNITY)
Admission: AD | Admit: 2015-06-08 | Discharge: 2015-06-08 | Disposition: A | Discharge status: Home / Self Care | Payer: Commercial Managed Care - HMO | Source: Skilled Nursing Facility | Attending: Internal Medicine | Admitting: Internal Medicine

## 2015-06-08 LAB — BASIC METABOLIC PANEL
Anion gap: 7 (ref 5–15)
BUN: 28 mg/dL — ABNORMAL HIGH (ref 6–20)
CALCIUM: 8.6 mg/dL — AB (ref 8.9–10.3)
CO2: 31 mmol/L (ref 22–32)
CREATININE: 1.3 mg/dL — AB (ref 0.61–1.24)
Chloride: 101 mmol/L (ref 101–111)
GFR calc non Af Amer: 53 mL/min — ABNORMAL LOW (ref >60)
Glucose, Bld: 130 mg/dL — ABNORMAL HIGH (ref 65–99)
Potassium: 3.7 mmol/L (ref 3.5–5.1)
SODIUM: 139 mmol/L (ref 135–145)

## 2015-06-08 LAB — GLUCOSE, CAPILLARY: GLUCOSE-CAPILLARY: 88 mg/dL (ref 65–99)

## 2015-06-11 ENCOUNTER — Encounter (HOSPITAL_COMMUNITY)
Admission: RE | Admit: 2015-06-11 | Discharge: 2015-06-11 | Disposition: A | Discharge status: Home / Self Care | Payer: Commercial Managed Care - HMO | Source: Skilled Nursing Facility | Attending: Internal Medicine | Admitting: Internal Medicine

## 2015-06-11 LAB — BASIC METABOLIC PANEL
ANION GAP: 7 (ref 5–15)
BUN: 21 mg/dL — ABNORMAL HIGH (ref 6–20)
CHLORIDE: 103 mmol/L (ref 101–111)
CO2: 28 mmol/L (ref 22–32)
Calcium: 8.8 mg/dL — ABNORMAL LOW (ref 8.9–10.3)
Creatinine, Ser: 1 mg/dL (ref 0.61–1.24)
GFR calc non Af Amer: >60 mL/min (ref >60)
Glucose, Bld: 139 mg/dL — ABNORMAL HIGH (ref 65–99)
POTASSIUM: 4.2 mmol/L (ref 3.5–5.1)
SODIUM: 138 mmol/L (ref 135–145)

## 2015-06-12 NOTE — Progress Notes (Addendum)
Patient ID: [name], male   DOB: 1940-07-12, 75 y.o.   MRN: [id number]                PROGRESS NOTE  DATE:  06/06/2015       FACILITY: [facility]                 LEVEL OF CARE:   SNF   Acute Visit             CHIEF COMPLAINT:  Review, status post observational admission to hospital for hyperkalemia.    HISTORY OF PRESENT ILLNESS:  This is a patient whom I admitted earlier this month after presenting to hospital two times from 05/09/2015 through 05/12/2015 and then again from 05/15/2015 through 05/18/2015 with a stroke and extension.  Unfortunately, this is a left paracentral pontine stroke.  He had marked right upper and lower extremity flaccid paralysis initially.    He is a diabetic.  Hemoglobin A1c was 7.1.    He was also felt to have had partial seizures and was put on Keppra.    The patient has a history of hypertension and apparently was on aldactone 25 mg.  For some reason, I did not really pick up on this.     Routine lab work was done in the facility which showed a potassium level on 06/04/2015 at 6.8.  He was treated in the emergency department.  However, the potassium went up to 7.4.  He was treated in standard fashion.  His discharge potassium was 5.3.  The aldactone was discontinued.    It would appear that his blood pressure is under decent control, currently at 122/65.    Also notable for the fact that he had some degree of renal insufficiency with an estimated GFR in the 50s, indicating stage III chronic renal failure.  He probably should not have been on aldactone in the first place.    Past Medical History  Diagnosis Date  . GERD (gastroesophageal reflux disease)   . CAD (coronary artery disease)   . Obesity   . Hyperlipidemia   . Hypertension   . Chronic back pain   . Gout   . Chronic bronchitis ([place])     "get it q yr"  . Psoriasis   . COPD (chronic obstructive pulmonary disease) ([place])   . Myocardial infarction ([place]) 1999  .  Pneumonia     "a few times; last time was ~ 06/2013"  . On home oxygen therapy     "2L; only at night" (08/30/2013)  . Diabetes mellitus, type 1   . Arthritis of knee   . Arthritis     "left leg" (08/30/2013)  . Vitamin B12 deficiency 05/01/2015  . Abnormality of gait 05/01/2015  . Tussive syncope 05/01/2015  . Stroke ([place])   . CVA (cerebral infarction)     CURRENT MEDICATIONS: . Current Outpatient Prescriptions on File Prior to Visit  Medication Sig Dispense Refill  . acetaminophen (TYLENOL) 500 MG tablet Take 500 mg by mouth every 6 (six) hours as needed for moderate pain (left thigh).    Marland Kitchen albuterol (PROVENTIL HFA;VENTOLIN HFA) 108 (90 BASE) MCG/ACT inhaler Inhale 1-2 puffs into the lungs every 6 (six) hours as needed for wheezing or shortness of breath. 1 Inhaler 2  . allopurinol (ZYLOPRIM) 300 MG tablet Take 300 mg by mouth daily.    Marland Kitchen amLODipine (NORVASC) 10 MG tablet Take 1 tablet (10 mg total) by mouth daily. 90 tablet 1  .  aspirin 325 MG tablet Take 1 tablet (325 mg total) by mouth daily.    . clopidogrel (PLAVIX) 75 MG tablet Take 1 tablet (75 mg total) by mouth daily.    . hydroxypropyl cellulose (LACRISERT) 5 MG INST Apply 5 mg to eye daily.    Marland Kitchen levETIRAcetam (KEPPRA) 500 MG tablet Take 1 tablet (500 mg total) by mouth 2 (two) times daily. 60 tablet 0  . levothyroxine (SYNTHROID, LEVOTHROID) 75 MCG tablet Take 75 mcg by mouth every morning.     . Maltodextrin-Xanthan Gum ([place]) POWD As needed    . metFORMIN (GLUCOPHAGE) 500 MG tablet Take 1 tablet (500 mg total) by mouth 2 (two) times daily. 60 tablet 3  . metoprolol (LOPRESSOR) 50 MG tablet Take 0.5 tablets (25 mg total) by mouth 2 (two) times daily. (Patient taking differently: Take 50 mg by mouth 2 (two) times daily. )    . pantoprazole (PROTONIX) 40 MG tablet Take 1 tablet (40 mg total) by mouth daily before breakfast. 30 tablet 1  . pravastatin (PRAVACHOL) 40 MG tablet Take 2 tablets (80 mg total) by  mouth daily.    Marland Kitchen senna (SENOKOT) 8.6 MG tablet Take 1 tablet by mouth daily.    . vitamin B-12 (CYANOCOBALAMIN) 1000 MCG tablet Take 1,000 mcg by mouth daily.     REVIEW OF SYSTEMS:    CHEST/RESPIRATORY:  No shortness of breath.   CARDIAC:  No chest pain.   GI:  No difficulty swallowing.  No nausea or vomiting.    NEUROLOGICAL:  He points to the fact that his right side is improving.    PHYSICAL EXAMINATION:   VITAL SIGNS:     BLOOD PRESSURE:  122/65.    GENERAL APPEARANCE:  The patient is not in any distress.  Conversational.   CHEST/RESPIRATORY:  Clear air entry bilaterally.    CARDIOVASCULAR:   CARDIAC:  Heart sounds are normal.  JVP is not elevated.        GASTROINTESTINAL:   LIVER/SPLEEN/KIDNEYS:  No liver, no spleen.  No tenderness.     GENITOURINARY:   BLADDER:  No suprapubic or costovertebral angle tenderness.   NEUROLOGICAL:    CRANIAL NERVES:   He still has a left lower motor neuron facial weakness, but he is indeed making good progress with his right arm and right leg.  I am not sure how functional this is in therapy as of yet.      ASSESSMENT/PLAN:              Hyperkalemia.  Presumably aldactone on top of stage III chronic renal failure.  This has been corrected.  I will recheck this later in the week.    Type 2 diabetes with chronic renal failure.  He is only on metformin at 500 b.i.d.   His blood sugars appear to be really well controlled, at 116 this morning.    Hyperlipidemia.  On pravastatin.    Gastroesophageal reflux disease.  On Protonix.    History of seizures.  See HPI.  On Keppra 500 b.i.d.     Late-effect pontine CVA.  He is on aspirin for prophylaxis.  He appears to be making good recovery.   Hypothyroidism.  On replacement.    History of gout.  On allopurinol.  Currently asymptomatic.    We will recheck a basic metabolic panel on Friday.  He has some degree of chronic renal failure with his diabetes.  He is not on an ACE inhibitor.  Indeed, he  may  not tolerate this.

## 2015-06-14 ENCOUNTER — Other Ambulatory Visit: Payer: Self-pay | Admitting: Licensed Clinical Social Worker

## 2015-06-14 NOTE — Patient Outreach (Signed)
Assessment:  CSW spoke via phone with client on 06/14/15. CSW verified client identity. CSW and client spoke of client needs.Client is currently receiving care at [facility] in [place], [place]. Client is receiving scheduled physical therapy sessions for client at [facility]. Client reported that physical therapy sessions are helpful to client. Client has also been receiving occupational therapy and speech therapy services while at facility. Client was hospitalized recently. He had experienced a stroke and is at [facility] receiving nursing support and therapy services as scheduled.  Client is hoping to be be able to discharge home soon. He said he may be able to discharge home possibiy on 06/19/15. Client has a walker and a 3 in 1 bedside commode at home. He has support from his wife Eziel Payant.  CSW spoke with client about he and his wife speaking with facility social worker to finalize discharge plans. CSW encouraged client and his wife to speak with facility social worker soon to finalize client discharge plans. Client is taking medications as prescribed.  Client is not having any pain issues. He is eating well and sleeping well.  Client has [facility] CSW card. CSW encouraged client to call CSW as needed to discuss social work needs of client. CSW thanked client for phone call with CSW on 06/14/15.  Plan: Client to participate in scheduled physical therapy sessions for client at [facility]. Client to cooperate with care providers at [facility].   CSW to call client in 2 weeks to assess client needs.[name].Nayda Riesen MSW, LCSW Licensed Clinical Social Worker [facility] Care Management [phone]

## 2015-06-17 ENCOUNTER — Encounter (HOSPITAL_COMMUNITY)
Admission: RE | Admit: 2015-06-17 | Discharge: 2015-06-17 | Disposition: A | Discharge status: Home / Self Care | Payer: Commercial Managed Care - HMO | Source: Skilled Nursing Facility | Attending: Internal Medicine | Admitting: Internal Medicine

## 2015-06-17 DIAGNOSIS — Z79899 Other long term (current) drug therapy: Secondary | ICD-10-CM | POA: Insufficient documentation

## 2015-06-17 LAB — BASIC METABOLIC PANEL
Anion gap: 9 (ref 5–15)
BUN: 21 mg/dL — AB (ref 6–20)
CHLORIDE: 99 mmol/L — AB (ref 101–111)
CO2: 28 mmol/L (ref 22–32)
Calcium: 9.5 mg/dL (ref 8.9–10.3)
Creatinine, Ser: 1.01 mg/dL (ref 0.61–1.24)
GFR calc Af Amer: >60 mL/min (ref >60)
GLUCOSE: 141 mg/dL — AB (ref 65–99)
POTASSIUM: 4.7 mmol/L (ref 3.5–5.1)
Sodium: 136 mmol/L (ref 135–145)

## 2015-06-18 DIAGNOSIS — I69991 Dysphagia following unspecified cerebrovascular disease: Secondary | ICD-10-CM | POA: Diagnosis not present

## 2015-06-18 DIAGNOSIS — I10 Essential (primary) hypertension: Secondary | ICD-10-CM | POA: Diagnosis not present

## 2015-06-18 DIAGNOSIS — R260 Ataxic gait: Secondary | ICD-10-CM | POA: Diagnosis not present

## 2015-06-18 DIAGNOSIS — E0829 Diabetes mellitus due to underlying condition with other diabetic kidney complication: Secondary | ICD-10-CM | POA: Diagnosis not present

## 2015-06-18 DIAGNOSIS — R410 Disorientation, unspecified: Secondary | ICD-10-CM | POA: Diagnosis not present

## 2015-06-18 DIAGNOSIS — G4733 Obstructive sleep apnea (adult) (pediatric): Secondary | ICD-10-CM | POA: Diagnosis not present

## 2015-06-18 DIAGNOSIS — G3184 Mild cognitive impairment, so stated: Secondary | ICD-10-CM | POA: Diagnosis not present

## 2015-06-18 DIAGNOSIS — I69953 Hemiplegia and hemiparesis following unspecified cerebrovascular disease affecting right non-dominant side: Secondary | ICD-10-CM | POA: Diagnosis not present

## 2015-06-20 ENCOUNTER — Non-Acute Institutional Stay (SKILLED_NURSING_FACILITY): Payer: Commercial Managed Care - HMO | Admitting: Internal Medicine

## 2015-06-20 ENCOUNTER — Encounter: Payer: Self-pay | Admitting: Internal Medicine

## 2015-06-20 ENCOUNTER — Other Ambulatory Visit: Payer: Self-pay | Admitting: Internal Medicine

## 2015-06-20 ENCOUNTER — Ambulatory Visit (HOSPITAL_COMMUNITY)
Admission: RE | Admit: 2015-06-20 | Discharge: 2015-06-20 | Disposition: A | Discharge status: Home / Self Care | Payer: Commercial Managed Care - HMO | Source: Ambulatory Visit | Attending: Internal Medicine | Admitting: Internal Medicine

## 2015-06-20 DIAGNOSIS — M79604 Pain in right leg: Secondary | ICD-10-CM

## 2015-06-20 DIAGNOSIS — R569 Unspecified convulsions: Secondary | ICD-10-CM

## 2015-06-20 DIAGNOSIS — R600 Localized edema: Secondary | ICD-10-CM

## 2015-06-20 DIAGNOSIS — I82401 Acute embolism and thrombosis of unspecified deep veins of right lower extremity: Secondary | ICD-10-CM

## 2015-06-20 DIAGNOSIS — M7989 Other specified soft tissue disorders: Principal | ICD-10-CM

## 2015-06-20 DIAGNOSIS — Z8673 Personal history of transient ischemic attack (TIA), and cerebral infarction without residual deficits: Secondary | ICD-10-CM | POA: Diagnosis not present

## 2015-06-20 DIAGNOSIS — E875 Hyperkalemia: Secondary | ICD-10-CM

## 2015-06-20 NOTE — Progress Notes (Signed)
Patient ID: [name], male   DOB: [birth year], 75 y.o.   MRN: [id number]          This is a discharge note     FACILITY: [place]:   SNF                CHIEF COMPLAINT:  Discharge note    HISTORY OF PRESENT ILLNESS:  This is a patient whom I admitted last  month after presenting to hospital two times from 05/09/2015 through 05/12/2015 and then again from 05/15/2015 through 05/18/2015 with a stroke and extension.  Unfortunately, this is a left paracentral pontine stroke.  He had marked right upper and lower extremity flaccid paralysis initially.    He is a diabetic.  Hemoglobin A1c was 7.1.    He was also felt to have had partial seizures and was put on Keppra.      .     Routine lab work was done in the facility which showed a potassium level on 06/04/2015 at 6.8.  He was treated in the emergency department.  However, the potassium went up to 7.4.  He was treated in standard fashion.  His discharge potassium was 5.3.  The aldactone was discontinued.   Potassium on the done January 29 was 4.7 this will need to be followed as an outpatient.  Patient has made significant recovery from his CVA has gained strength is right extremities although he still has significant weakness-he will be going home with his wife who is very supportive-he will need home health support including physical therapy for further strengthening.  Currently has no complaints-      Past Medical History  Diagnosis Date  . GERD (gastroesophageal reflux disease)   . CAD (coronary artery disease)   . Obesity   . Hyperlipidemia   . Hypertension   . Chronic back pain   . Gout   . Chronic bronchitis ([place])     "get it q yr"  . Psoriasis   . COPD (chronic obstructive pulmonary disease) ([place])   . Myocardial infarction ([place]) 1999  . Pneumonia     "a few times; last time was ~ 06/2013"  . On home oxygen therapy     "2L; only at night" (08/30/2013)  . Diabetes  mellitus, type 1   . Arthritis of knee   . Arthritis     "left leg" (08/30/2013)  . Vitamin B12 deficiency 05/01/2015  . Abnormality of gait 05/01/2015  . Tussive syncope 05/01/2015  . Stroke ([place])   . CVA (cerebral infarction)     CURRENT MEDICATIONS: . Current Outpatient Prescriptions on File Prior to Visit  Medication Sig Dispense Refill  . acetaminophen (TYLENOL) 500 MG tablet Take 500 mg by mouth every 6 (six) hours as needed for moderate pain (left thigh).    Marland Kitchen albuterol (PROVENTIL HFA;VENTOLIN HFA) 108 (90 BASE) MCG/ACT inhaler Inhale 1-2 puffs into the lungs every 6 (six) hours as needed for wheezing or shortness of breath. 1 Inhaler 2  . allopurinol (ZYLOPRIM) 300 MG tablet Take 300 mg by mouth daily.    Marland Kitchen amLODipine (NORVASC) 10 MG tablet Take 1 tablet (10 mg total) by mouth daily. 90 tablet 1  . aspirin 325 MG tablet Take 1 tablet (325 mg total) by mouth daily.    . clopidogrel (PLAVIX) 75 MG tablet Take 1 tablet (75 mg total) by  mouth daily.    . hydroxypropyl cellulose (LACRISERT) 5 MG INST Apply 5 mg to eye daily.    Marland Kitchen levETIRAcetam (KEPPRA) 500 MG tablet Take 1 tablet (500 mg total) by mouth 2 (two) times daily. 60 tablet 0  . levothyroxine (SYNTHROID, LEVOTHROID) 75 MCG tablet Take 75 mcg by mouth every morning.     . Maltodextrin-Xanthan Gum ([place]) POWD As needed    . metFORMIN (GLUCOPHAGE) 500 MG tablet Take 1 tablet (500 mg total) by mouth 2 (two) times daily. 60 tablet 3  . metoprolol (LOPRESSOR) 50 MG tablet Take 0.5 tablets (25 mg total) by mouth 2 (two) times daily. (Patient taking differently: Take 50 mg by mouth 2 (two) times daily. )    . pantoprazole (PROTONIX) 40 MG tablet Take 1 tablet (40 mg total) by mouth daily before breakfast. 30 tablet 1  . pravastatin (PRAVACHOL) 40 MG tablet Take 2 tablets (80 mg total) by mouth daily.    Marland Kitchen senna (SENOKOT) 8.6 MG tablet Take 1 tablet by mouth daily.    . vitamin B-12 (CYANOCOBALAMIN) 1000 MCG tablet  Take 1,000 mcg by mouth daily.     REVIEW OF SYSTEMS: Gen- no complaints of fever or chills.  Skin does not complain of rashes or itching.  Head ears eyes nose mouth and throat no complaints of sore throat --continues withl rleftsided facial weakness      CHEST/RESPIRATORY:  No shortness of breath.   CARDIAC:  No chest pain.   GI:  No difficulty swallowing.  No nausea or vomiting. Or abdominal discomfort   NEUROLOGICAL:  He points to the fact that his right side  Continues to improve    PHYSICAL EXAMINATION:    Temperature 98.1 pulse 77 respirations 20 blood pressure 144/84-121/63 in this range O2 saturations are in the 90s on room air.    GENERAL APPEARANCE:  The patient is not in any distress.  Conversational. His skin is warm and dry.  Eyes   CHEST/RESPIRATORY:  Clear air entry bilaterally.    CARDIOVASCULAR:  -- CARDIAC:  Heart sounds are normal.  JVP is not elevated. Has1- 2+ right lower extremity edema mild left lower extremity edema       GASTROINTESTINAL:   L Abdomen obese soft nontender positive bowel sounds     GENITOURINARY:   BLADDER:  No suprapubic or costovertebral angle tenderness.   NEUROLOGICAL:    CRANIAL NERVES:   He still has a left lower motor neuron facial weakness But has made significant progress on his right arm and leg  Labs.  06/17/2015.  Sodium 136 potassium 4.7 BUN 21 creatinine 1.01.  06/05/2015.  WBC 10.8 hemoglobin 9.8 platelets 322.  06/04/2015.  TSH-3.385       ASSESSMENT/PLAN:              Hyperkalemia.  Presumably aldactone on top of stage III chronic renal failure.  This has been corrected.  Potassium most recently 4.7 on January 29 this will need to be rechecked by home health later this week primary care provider notified of results largely in the lower mid 100s    Type 2 diabetes with chronic renal failure.  He is only on metformin at 500 b.i.d.   His blood sugars appear to be really well controlled, largely in the lower mid  100s    Hyperlipidemia.  On pravastatin.    Gastroesophageal reflux disease.  On Protonix.    History of seizures.  See HPI.  On Keppra 500  b.i.d--this appears to have been stable during his stay here.     Late-effect pontine CVA.  He is on aspirin for prophylaxis.  He appears to be making good recovery.   Hypothyroidism.  On replacement. TSH within normal limits at 3.385 on January 16   History of gout.  On allopurinol.  Currently asymptomatic.    Again he will need continued PT and home health support he does have a very supportive wife he will need to have his potassium checked later this week-.    Of note we also ordered a venous Doppler of his right leg secondary to the edema-this has come back negative.    [id number] note greater than 30 minutes spent on this discharge summary-greater than 50% of time spent coordinating plan of care     .

## 2015-06-21 ENCOUNTER — Emergency Department (HOSPITAL_COMMUNITY)
Admission: EM | Admit: 2015-06-21 | Discharge: 2015-06-22 | Disposition: A | Discharge status: Home / Self Care | Payer: Commercial Managed Care - HMO | Source: Home / Self Care | Attending: Emergency Medicine | Admitting: Emergency Medicine

## 2015-06-21 ENCOUNTER — Encounter (HOSPITAL_COMMUNITY): Payer: Self-pay

## 2015-06-21 DIAGNOSIS — Z9981 Dependence on supplemental oxygen: Secondary | ICD-10-CM | POA: Insufficient documentation

## 2015-06-21 DIAGNOSIS — Z7984 Long term (current) use of oral hypoglycemic drugs: Secondary | ICD-10-CM

## 2015-06-21 DIAGNOSIS — G8929 Other chronic pain: Secondary | ICD-10-CM | POA: Insufficient documentation

## 2015-06-21 DIAGNOSIS — Z87891 Personal history of nicotine dependence: Secondary | ICD-10-CM

## 2015-06-21 DIAGNOSIS — R600 Localized edema: Secondary | ICD-10-CM | POA: Insufficient documentation

## 2015-06-21 DIAGNOSIS — I2510 Atherosclerotic heart disease of native coronary artery without angina pectoris: Secondary | ICD-10-CM

## 2015-06-21 DIAGNOSIS — Z872 Personal history of diseases of the skin and subcutaneous tissue: Secondary | ICD-10-CM | POA: Insufficient documentation

## 2015-06-21 DIAGNOSIS — Z79899 Other long term (current) drug therapy: Secondary | ICD-10-CM

## 2015-06-21 DIAGNOSIS — E785 Hyperlipidemia, unspecified: Secondary | ICD-10-CM | POA: Insufficient documentation

## 2015-06-21 DIAGNOSIS — I252 Old myocardial infarction: Secondary | ICD-10-CM | POA: Insufficient documentation

## 2015-06-21 DIAGNOSIS — Z7982 Long term (current) use of aspirin: Secondary | ICD-10-CM | POA: Insufficient documentation

## 2015-06-21 DIAGNOSIS — Z8673 Personal history of transient ischemic attack (TIA), and cerebral infarction without residual deficits: Secondary | ICD-10-CM

## 2015-06-21 DIAGNOSIS — Z7902 Long term (current) use of antithrombotics/antiplatelets: Secondary | ICD-10-CM

## 2015-06-21 DIAGNOSIS — M1990 Unspecified osteoarthritis, unspecified site: Secondary | ICD-10-CM

## 2015-06-21 DIAGNOSIS — R0602 Shortness of breath: Secondary | ICD-10-CM | POA: Diagnosis not present

## 2015-06-21 DIAGNOSIS — E109 Type 1 diabetes mellitus without complications: Secondary | ICD-10-CM

## 2015-06-21 DIAGNOSIS — K219 Gastro-esophageal reflux disease without esophagitis: Secondary | ICD-10-CM

## 2015-06-21 DIAGNOSIS — Z8701 Personal history of pneumonia (recurrent): Secondary | ICD-10-CM

## 2015-06-21 DIAGNOSIS — M109 Gout, unspecified: Secondary | ICD-10-CM | POA: Insufficient documentation

## 2015-06-21 DIAGNOSIS — I10 Essential (primary) hypertension: Secondary | ICD-10-CM | POA: Insufficient documentation

## 2015-06-21 DIAGNOSIS — I639 Cerebral infarction, unspecified: Secondary | ICD-10-CM | POA: Diagnosis not present

## 2015-06-21 DIAGNOSIS — J441 Chronic obstructive pulmonary disease with (acute) exacerbation: Secondary | ICD-10-CM | POA: Diagnosis not present

## 2015-06-21 NOTE — ED Notes (Signed)
Pt states he has been increasingly sob today, states he just got out of [facility] yesterday after Stroke rehab.  Pt denies cp

## 2015-06-22 ENCOUNTER — Emergency Department (HOSPITAL_COMMUNITY): Payer: Commercial Managed Care - HMO

## 2015-06-22 ENCOUNTER — Other Ambulatory Visit: Payer: Self-pay | Admitting: *Deleted

## 2015-06-22 ENCOUNTER — Ambulatory Visit (INDEPENDENT_AMBULATORY_CARE_PROVIDER_SITE_OTHER): Payer: Commercial Managed Care - HMO | Admitting: Family Medicine

## 2015-06-22 ENCOUNTER — Encounter: Payer: Self-pay | Admitting: Family Medicine

## 2015-06-22 VITALS — BP 140/74 | HR 94 | Resp 18 | Ht 71.0 in | Wt 240.0 lb

## 2015-06-22 DIAGNOSIS — I10 Essential (primary) hypertension: Secondary | ICD-10-CM | POA: Diagnosis not present

## 2015-06-22 DIAGNOSIS — M159 Polyosteoarthritis, unspecified: Secondary | ICD-10-CM | POA: Diagnosis not present

## 2015-06-22 DIAGNOSIS — I2510 Atherosclerotic heart disease of native coronary artery without angina pectoris: Secondary | ICD-10-CM

## 2015-06-22 DIAGNOSIS — I639 Cerebral infarction, unspecified: Secondary | ICD-10-CM

## 2015-06-22 DIAGNOSIS — J449 Chronic obstructive pulmonary disease, unspecified: Secondary | ICD-10-CM | POA: Diagnosis not present

## 2015-06-22 DIAGNOSIS — Z09 Encounter for follow-up examination after completed treatment for conditions other than malignant neoplasm: Secondary | ICD-10-CM

## 2015-06-22 DIAGNOSIS — I69351 Hemiplegia and hemiparesis following cerebral infarction affecting right dominant side: Secondary | ICD-10-CM | POA: Diagnosis not present

## 2015-06-22 DIAGNOSIS — R0602 Shortness of breath: Secondary | ICD-10-CM | POA: Diagnosis not present

## 2015-06-22 DIAGNOSIS — G40909 Epilepsy, unspecified, not intractable, without status epilepticus: Secondary | ICD-10-CM | POA: Diagnosis not present

## 2015-06-22 DIAGNOSIS — E1122 Type 2 diabetes mellitus with diabetic chronic kidney disease: Secondary | ICD-10-CM | POA: Diagnosis not present

## 2015-06-22 DIAGNOSIS — D510 Vitamin B12 deficiency anemia due to intrinsic factor deficiency: Secondary | ICD-10-CM | POA: Diagnosis not present

## 2015-06-22 DIAGNOSIS — M549 Dorsalgia, unspecified: Secondary | ICD-10-CM | POA: Diagnosis not present

## 2015-06-22 DIAGNOSIS — N183 Chronic kidney disease, stage 3 (moderate): Secondary | ICD-10-CM | POA: Diagnosis not present

## 2015-06-22 LAB — CBC WITH DIFFERENTIAL/PLATELET
BASOS ABS: 0.1 10*3/uL (ref 0.0–0.1)
BASOS PCT: 1 %
EOS ABS: 0.3 10*3/uL (ref 0.0–0.7)
Eosinophils Relative: 3 %
HCT: 29.7 % — ABNORMAL LOW (ref 39.0–52.0)
HEMOGLOBIN: 9.2 g/dL — AB (ref 13.0–17.0)
Lymphocytes Relative: 20 %
Lymphs Abs: 2.4 10*3/uL (ref 0.7–4.0)
MCH: 27.3 pg (ref 26.0–34.0)
MCHC: 31 g/dL (ref 30.0–36.0)
MCV: 88.1 fL (ref 78.0–100.0)
MONOS PCT: 7 %
Monocytes Absolute: 0.9 10*3/uL (ref 0.1–1.0)
NEUTROS ABS: 8.5 10*3/uL — AB (ref 1.7–7.7)
NEUTROS PCT: 69 %
Platelets: 242 10*3/uL (ref 150–400)
RBC: 3.37 MIL/uL — ABNORMAL LOW (ref 4.22–5.81)
RDW: 15.6 % — ABNORMAL HIGH (ref 11.5–15.5)
WBC: 12.3 10*3/uL — AB (ref 4.0–10.5)

## 2015-06-22 LAB — TROPONIN I

## 2015-06-22 LAB — BASIC METABOLIC PANEL
ANION GAP: 8 (ref 5–15)
BUN: 23 mg/dL — ABNORMAL HIGH (ref 6–20)
CALCIUM: 9.7 mg/dL (ref 8.9–10.3)
CHLORIDE: 105 mmol/L (ref 101–111)
CO2: 28 mmol/L (ref 22–32)
CREATININE: 1.36 mg/dL — AB (ref 0.61–1.24)
GFR calc non Af Amer: 50 mL/min — ABNORMAL LOW (ref >60)
GFR, EST AFRICAN AMERICAN: 58 mL/min — AB (ref >60)
Glucose, Bld: 184 mg/dL — ABNORMAL HIGH (ref 65–99)
Potassium: 4.9 mmol/L (ref 3.5–5.1)
SODIUM: 141 mmol/L (ref 135–145)

## 2015-06-22 LAB — BRAIN NATRIURETIC PEPTIDE: B NATRIURETIC PEPTIDE 5: 29 pg/mL (ref 0.0–100.0)

## 2015-06-22 MED ORDER — ALBUTEROL (5 MG/ML) CONTINUOUS INHALATION SOLN
10.0000 mg/h | INHALATION_SOLUTION | RESPIRATORY_TRACT | Status: DC
  Administered 2015-06-22: 10 mg/h via RESPIRATORY_TRACT
  Filled 2015-06-22: qty 20

## 2015-06-22 MED ORDER — PREDNISONE 20 MG PO TABS: 60.0000 mg | ORAL_TABLET | Freq: Every day | ORAL | Status: DC

## 2015-06-22 MED ORDER — ALBUTEROL SULFATE HFA 108 (90 BASE) MCG/ACT IN AERS: 2.0000 | INHALATION_SPRAY | RESPIRATORY_TRACT | Status: DC | PRN

## 2015-06-22 MED ORDER — METHYLPREDNISOLONE SODIUM SUCC 125 MG IJ SOLR
125.0000 mg | Freq: Once | INTRAMUSCULAR | Status: AC
  Administered 2015-06-22: 125 mg via INTRAVENOUS
  Filled 2015-06-22: qty 2

## 2015-06-22 NOTE — Discharge Instructions (Signed)

## 2015-06-22 NOTE — Patient Outreach (Signed)
Call to patient home for transition of Care week #1  Patient recently discharged from SNF No answer, unable to leave a message. Will attempt to call again at a later time. [name]. Laymond Purser, RN, BSN, [place] [phone]

## 2015-06-22 NOTE — Patient Instructions (Signed)
F/u in 3 months, call if you need me sooner  Continue medications as you are taking them now  EAT SLOWLY, CHEW CAREFULLY,DO EVERYTHING THAT YOU CAN NOT TO CHOKE>  We will have you  Tested to see if you need oxygen at [place] once more  [name], will need to feed you spoon by spoon until you show her that you can do this safely on your own  Thankful that you are much better and able to be back at home!

## 2015-06-22 NOTE — ED Provider Notes (Signed)
By signing my name below, I, [name], attest that this documentation has been prepared under the direction and in the presence of [employer], DO. Electronically Signed: Eustaquio [name], ED Scribe. 06/22/2015. 12:24 AM.  TIME SEEN: 12:13 AM  CHIEF COMPLAINT: Shortness of breath  HPI:  [name] is a 75 y.o. male with hx CAD, HLD, HTN, COPD, DM, and CVA who presents to the Emergency Department complaining of gradual onset, constant, worsening, shortness of breath that began tonight. Pt also complains of coughing, wheezing, and diarrhea. He has hx of COPD and states this feels similar.  Denies history of heart failure. Denies any chest pain or chest discomfort. No fever. Has a chronic cough which he reports is unchanged. Has had some bilateral lower extremities swelling for the past several months slightly worse on the right but recently had a venous Doppler on February 1 that showed no DVT.   Family member reports that pt has had multiple episodes of "going out" in the past 2 months. His most recent episode was prior to arrival. Pt states that he was sitting on the couch drinking water when he "passed out". Family denies seizure like activity and states that he was slightly confused and then began acting normally afterwards. Pt recently had a stroke and has been in rehab at [facility] until yesterday when he was discharged. Patient suffered a left pontine infarct in December 2016 and then subsequently had extension days later despite being on aspirin. He is currently on Plavix. Wife reports that they were told that these episodes were likely partial seizures. He has had some intermittent episodes of right arm shaking. He is on Keppra 500 mg twice a day. Patient does not mention this is a complaint. He states that he is not worried about this and this has been something that has been going on for 2 months and is unchanged. He states his episodes happen 1-2 times a week. He has outpatient  follow-up for this. Denies history of arrhythmia. He states he is not driving.   Denies chest pain, chest discomfort, vomiting, hematochezia, melena, or any other associated symptoms.  He reports since his stroke he has had some right-sided numbness and weakness in his arm and leg, left sided facial droop but reports this is improving and not worsening. No new numbness, focal weakness. Pt is former smoker who quit 10-15 years ago.   Per chart review: Pt had an echocardiogram on 05/10/2015 (1.5 months ago) which showed Grade I diastolic dysfunction. EF was 65%. Patient also had carotid Doppler on 05/12/2015 which showed no significant ICA stenosis and less than 50% plaque bilaterally.    ROS: See HPI Constitutional: no fever  Eyes: no drainage  ENT: no runny nose   Cardiovascular:  no chest pain, chest discomfort  Resp: cough, wheezing, and SOB  GI: diarrhea. no vomiting, hematochezia, melena GU: no dysuria Integumentary: no rash  Allergy: no hives  Musculoskeletal: no leg swelling  Neurological: no slurred speech ROS otherwise negative  PAST MEDICAL HISTORY/PAST SURGICAL HISTORY:  Past Medical History  Diagnosis Date  . GERD (gastroesophageal reflux disease)   . CAD (coronary artery disease)   . Obesity   . Hyperlipidemia   . Hypertension   . Chronic back pain   . Gout   . Chronic bronchitis ([place])     "get it q yr"  . Psoriasis   . COPD (chronic obstructive pulmonary disease) ([place])   . Myocardial infarction ([place]) 1999  .  Pneumonia     "a few times; last time was ~ 06/2013"  . On home oxygen therapy     "2L; only at night" (08/30/2013)  . Diabetes mellitus, type 1   . Arthritis of knee   . Arthritis     "left leg" (08/30/2013)  . Vitamin B12 deficiency 05/01/2015  . Abnormality of gait 05/01/2015  . Tussive syncope 05/01/2015  . Stroke ([place])   . CVA (cerebral infarction)     MEDICATIONS:  Prior to Admission medications   Medication Sig Start Date End Date Taking?  Authorizing Provider  acetaminophen (TYLENOL) 500 MG tablet Take 500 mg by mouth every 6 (six) hours as needed for moderate pain (left thigh).    Historical Provider, MD  albuterol (PROVENTIL HFA;VENTOLIN HFA) 108 (90 BASE) MCG/ACT inhaler Inhale 1-2 puffs into the lungs every 6 (six) hours as needed for wheezing or shortness of breath. 11/13/14   Nat Christen, MD  allopurinol (ZYLOPRIM) 300 MG tablet Take 300 mg by mouth daily. 05/02/13   Historical Provider, MD  amLODipine (NORVASC) 10 MG tablet Take 1 tablet (10 mg total) by mouth daily. 09/20/14   Fayrene Helper, MD  aspirin 325 MG tablet Take 1 tablet (325 mg total) by mouth daily. 05/12/15   Kathie Dike, MD  clopidogrel (PLAVIX) 75 MG tablet Take 1 tablet (75 mg total) by mouth daily. 05/18/15   Samuella Cota, MD  hydroxypropyl cellulose (LACRISERT) 5 MG INST Apply 5 mg to eye daily.    Historical Provider, MD  levETIRAcetam (KEPPRA) 500 MG tablet Take 1 tablet (500 mg total) by mouth 2 (two) times daily. 05/12/15   Kathie Dike, MD  levothyroxine (SYNTHROID, LEVOTHROID) 75 MCG tablet Take 75 mcg by mouth every morning.     Historical Provider, MD  Maltodextrin-Xanthan Gum ([place]) POWD As needed 05/18/15   Samuella Cota, MD  metFORMIN (GLUCOPHAGE) 500 MG tablet Take 1 tablet (500 mg total) by mouth 2 (two) times daily. 09/02/13   Charolette Forward, MD  metoprolol (LOPRESSOR) 50 MG tablet Take 0.5 tablets (25 mg total) by mouth 2 (two) times daily. Patient taking differently: Take 50 mg by mouth 2 (two) times daily.  05/18/15   Samuella Cota, MD  pantoprazole (PROTONIX) 40 MG tablet Take 1 tablet (40 mg total) by mouth daily before breakfast. 05/12/15   Kathie Dike, MD  pravastatin (PRAVACHOL) 40 MG tablet Take 2 tablets (80 mg total) by mouth daily. 05/18/15   Samuella Cota, MD  senna (SENOKOT) 8.6 MG tablet Take 1 tablet by mouth daily.    Historical Provider, MD  vitamin B-12 (CYANOCOBALAMIN) 1000 MCG  tablet Take 1,000 mcg by mouth daily.    Historical Provider, MD    ALLERGIES:  No Known Allergies  SOCIAL HISTORY:  Social History  Substance Use Topics  . Smoking status: Former Smoker -- 0.50 packs/day for 33 years    Types: Cigarettes  . Smokeless tobacco: Never Used     Comment: "stopped smoking in the 1990's  . Alcohol Use: No    FAMILY HISTORY: Family History  Problem Relation Age of Onset  . Hypertension Mother   . Diabetes Mother   . Hypertension Father   . Diabetes Brother   . Arthritis      Family History   . Diabetes      family History   . Stroke Daughter     EXAM: BP 140/67 mmHg  Pulse 84  Temp(Src) 97.6 F (36.4  C) (Oral)  Resp 20  Ht 5\' 10"  (1.778 m)  Wt 238 lb (107.956 kg)  BMI 34.15 kg/m2  SpO2 95%   CONSTITUTIONAL: Alert and oriented and responds appropriately to questions. chronically ill appearing; elderly; in no distress HEAD: Normocephalic EYES: Conjunctivae clear, PERRL ENT: normal nose; no rhinorrhea; moist mucous membranes; pharynx without lesions noted NECK: Supple, no meningismus, no LAD  CARD: RRR; S1 and S2 appreciated; no murmurs, no clicks, no rubs, no gallops RESP: Normal chest excursion without splinting or tachypnea; breath sounds equal bilaterally; diffuse expiratory diffusely and diminished aeration diffusely, no rhonchi, no rales, no hypoxia or respiratory distress, speaking full sentences ABD/GI: Normal bowel sounds; non-distended; soft, non-tender, no rebound, no guarding, no peritoneal signs BACK:  The back appears normal and is non-tender to palpation, there is no CVA tenderness EXT: Normal ROM in all joints; non-tender to palpation; +1 pitting edema BLEs; normal capillary refill; no cyanosis, no calf tenderness or swelling    SKIN: Normal color for age and race; warm NEURO: Moves all extremities equally, left sided facial droop, decreased sensation throughout his right side, no dysarthria or aphasia; cranial nerves II  through XII intact, no pronator drift, normal gait PSYCH: The patient's mood and manner are appropriate. Grooming and personal hygiene are appropriate.  MEDICAL DECISION MAKING: Patient here with complaints of shortness of breath, wheezing. Suspect COPD exacerbation. He has diminished aeration and wheezing on exam. Will give albuterol, Solu-Medrol and reassess. Does have some mild edema in bilateral lower extremities but has no known history of CHF and recently had an echocardiogram with an EF of 65% with grade 1 diastolic dysfunction. Does not appear volume overloaded otherwise. Denies any chest pain or chest discomfort. No history of PE or DVT.  As for patient's "going out" spells, I suspect that these may be seizures. He is on Keppra 500 mg twice daily. States he has been having these episodes since his stroke and they're unchanged. No preceding chest pain, shortness of breath, palpitations, No new neurologic deficit. He states he is not concerned about these episodes. It is his wife is at bedside he mentioned that he had another episode today prior to arrival. He states that he has 1-2 episodes like this every week for the past 2 months.  ED PROGRESS: Patient's labs show mild leukocytosis with left shift. He is anemic but this appears to be chronic. Troponin negative. BNP 29. Chest x-ray shows no acute abnormality. He has much better aeration and reports feeling back to his baseline after 10 mg of continuous albuterol. He is able to ambulate and his oxygen saturation does get to 88% briefly but then comes back up. I suspect that this is his baseline given he has COPD and would not qualify him for oxygen.  I suspect that his symptoms were secondary to COPD exacerbation. He does have risk factors for pulmonary embolus but given he is feeling much better back to baseline after albuterol feeling PE is less likely. He reports he is ready for discharge home and does not want admission. I feel this is a  reasonable plan. He has an albuterol inhaler currently and I will give him a refill. Also discharge him on a steroid burst. Discussed with him and his wife at length return precautions. Have advised him not to drive if he is having these partial seizures and follow-up with his PCP, neurologist. He is on [place]. Have advised him to continue this medication. No new focal neurologic deficits today. They  verbalize understanding and are comfortable with this plan.   EKG Interpretation  Date/Time:  Friday June 22 2015 00:09:31 EST Ventricular Rate:  79 PR Interval:  219 QRS Duration: 77 QT Interval:  380 QTC Calculation: 436 R Axis:   -17 Text Interpretation:  Sinus rhythm Borderline prolonged PR interval Borderline left axis deviation Low voltage, precordial leads Abnormal R-wave progression, early transition No significant change since last tracing Confirmed by Hong Moring,  DO, Carson Bogden [id number]) on 06/22/2015 12:18:44 AM         I personally performed the services described in this documentation, which was scribed in my presence. The recorded information has been reviewed and is accurate.    [place], DO 06/22/15 [phone]

## 2015-06-22 NOTE — ED Notes (Signed)
Pt states understanding of care given and follow up instructions 

## 2015-06-22 NOTE — ED Notes (Signed)
Pt ambulated from room to ice machine and back with walker.  Minimal increase with work of breathing, pt states he does not feel short of breath and "I feel pretty good".  Informed Dr. Leonides Schanz

## 2015-06-24 ENCOUNTER — Encounter (HOSPITAL_COMMUNITY): Payer: Self-pay | Admitting: *Deleted

## 2015-06-24 ENCOUNTER — Emergency Department (HOSPITAL_COMMUNITY): Payer: Commercial Managed Care - HMO

## 2015-06-24 ENCOUNTER — Inpatient Hospital Stay (HOSPITAL_COMMUNITY)
Admission: EM | Admit: 2015-06-24 | Discharge: 2015-06-26 | DRG: 065 | Disposition: A | Discharge status: Home Health | Payer: Commercial Managed Care - HMO | Attending: Internal Medicine | Admitting: Internal Medicine

## 2015-06-24 DIAGNOSIS — I129 Hypertensive chronic kidney disease with stage 1 through stage 4 chronic kidney disease, or unspecified chronic kidney disease: Secondary | ICD-10-CM | POA: Diagnosis not present

## 2015-06-24 DIAGNOSIS — E1022 Type 1 diabetes mellitus with diabetic chronic kidney disease: Secondary | ICD-10-CM | POA: Diagnosis present

## 2015-06-24 DIAGNOSIS — E1021 Type 1 diabetes mellitus with diabetic nephropathy: Secondary | ICD-10-CM | POA: Diagnosis not present

## 2015-06-24 DIAGNOSIS — R1310 Dysphagia, unspecified: Secondary | ICD-10-CM | POA: Diagnosis present

## 2015-06-24 DIAGNOSIS — E538 Deficiency of other specified B group vitamins: Secondary | ICD-10-CM | POA: Diagnosis present

## 2015-06-24 DIAGNOSIS — R200 Anesthesia of skin: Secondary | ICD-10-CM | POA: Diagnosis not present

## 2015-06-24 DIAGNOSIS — M6289 Other specified disorders of muscle: Secondary | ICD-10-CM

## 2015-06-24 DIAGNOSIS — Z87891 Personal history of nicotine dependence: Secondary | ICD-10-CM

## 2015-06-24 DIAGNOSIS — K219 Gastro-esophageal reflux disease without esophagitis: Secondary | ICD-10-CM | POA: Diagnosis present

## 2015-06-24 DIAGNOSIS — R269 Unspecified abnormalities of gait and mobility: Secondary | ICD-10-CM | POA: Diagnosis not present

## 2015-06-24 DIAGNOSIS — Z794 Long term (current) use of insulin: Secondary | ICD-10-CM

## 2015-06-24 DIAGNOSIS — E10649 Type 1 diabetes mellitus with hypoglycemia without coma: Secondary | ICD-10-CM | POA: Diagnosis not present

## 2015-06-24 DIAGNOSIS — N182 Chronic kidney disease, stage 2 (mild): Secondary | ICD-10-CM | POA: Diagnosis present

## 2015-06-24 DIAGNOSIS — I10 Essential (primary) hypertension: Secondary | ICD-10-CM | POA: Diagnosis not present

## 2015-06-24 DIAGNOSIS — F482 Pseudobulbar affect: Secondary | ICD-10-CM | POA: Diagnosis present

## 2015-06-24 DIAGNOSIS — R410 Disorientation, unspecified: Secondary | ICD-10-CM | POA: Diagnosis not present

## 2015-06-24 DIAGNOSIS — I2510 Atherosclerotic heart disease of native coronary artery without angina pectoris: Secondary | ICD-10-CM | POA: Diagnosis present

## 2015-06-24 DIAGNOSIS — Z833 Family history of diabetes mellitus: Secondary | ICD-10-CM

## 2015-06-24 DIAGNOSIS — Z8249 Family history of ischemic heart disease and other diseases of the circulatory system: Secondary | ICD-10-CM | POA: Diagnosis not present

## 2015-06-24 DIAGNOSIS — Z79899 Other long term (current) drug therapy: Secondary | ICD-10-CM

## 2015-06-24 DIAGNOSIS — E1121 Type 2 diabetes mellitus with diabetic nephropathy: Secondary | ICD-10-CM | POA: Diagnosis present

## 2015-06-24 DIAGNOSIS — I252 Old myocardial infarction: Secondary | ICD-10-CM | POA: Diagnosis not present

## 2015-06-24 DIAGNOSIS — Z9981 Dependence on supplemental oxygen: Secondary | ICD-10-CM

## 2015-06-24 DIAGNOSIS — Z823 Family history of stroke: Secondary | ICD-10-CM

## 2015-06-24 DIAGNOSIS — J42 Unspecified chronic bronchitis: Secondary | ICD-10-CM | POA: Diagnosis not present

## 2015-06-24 DIAGNOSIS — Z8673 Personal history of transient ischemic attack (TIA), and cerebral infarction without residual deficits: Secondary | ICD-10-CM | POA: Diagnosis not present

## 2015-06-24 DIAGNOSIS — Z7952 Long term (current) use of systemic steroids: Secondary | ICD-10-CM | POA: Diagnosis not present

## 2015-06-24 DIAGNOSIS — G8191 Hemiplegia, unspecified affecting right dominant side: Secondary | ICD-10-CM | POA: Diagnosis present

## 2015-06-24 DIAGNOSIS — Z7902 Long term (current) use of antithrombotics/antiplatelets: Secondary | ICD-10-CM

## 2015-06-24 DIAGNOSIS — Z955 Presence of coronary angioplasty implant and graft: Secondary | ICD-10-CM

## 2015-06-24 DIAGNOSIS — Z7982 Long term (current) use of aspirin: Secondary | ICD-10-CM

## 2015-06-24 DIAGNOSIS — E1142 Type 2 diabetes mellitus with diabetic polyneuropathy: Secondary | ICD-10-CM | POA: Diagnosis present

## 2015-06-24 DIAGNOSIS — R531 Weakness: Secondary | ICD-10-CM | POA: Diagnosis not present

## 2015-06-24 DIAGNOSIS — I69953 Hemiplegia and hemiparesis following unspecified cerebrovascular disease affecting right non-dominant side: Secondary | ICD-10-CM | POA: Diagnosis not present

## 2015-06-24 DIAGNOSIS — I639 Cerebral infarction, unspecified: Secondary | ICD-10-CM

## 2015-06-24 DIAGNOSIS — R471 Dysarthria and anarthria: Secondary | ICD-10-CM | POA: Diagnosis present

## 2015-06-24 DIAGNOSIS — R260 Ataxic gait: Secondary | ICD-10-CM | POA: Diagnosis not present

## 2015-06-24 DIAGNOSIS — I69991 Dysphagia following unspecified cerebrovascular disease: Secondary | ICD-10-CM | POA: Diagnosis not present

## 2015-06-24 DIAGNOSIS — J449 Chronic obstructive pulmonary disease, unspecified: Secondary | ICD-10-CM | POA: Diagnosis not present

## 2015-06-24 DIAGNOSIS — E785 Hyperlipidemia, unspecified: Secondary | ICD-10-CM | POA: Diagnosis not present

## 2015-06-24 DIAGNOSIS — I638 Other cerebral infarction: Secondary | ICD-10-CM | POA: Diagnosis not present

## 2015-06-24 DIAGNOSIS — G4733 Obstructive sleep apnea (adult) (pediatric): Secondary | ICD-10-CM | POA: Diagnosis not present

## 2015-06-24 DIAGNOSIS — E119 Type 2 diabetes mellitus without complications: Secondary | ICD-10-CM | POA: Diagnosis present

## 2015-06-24 LAB — DIFFERENTIAL
BASOS ABS: 0.1 10*3/uL (ref 0.0–0.1)
BASOS PCT: 1 %
EOS ABS: 0.2 10*3/uL (ref 0.0–0.7)
Eosinophils Relative: 2 %
Lymphocytes Relative: 25 %
Lymphs Abs: 3.6 10*3/uL (ref 0.7–4.0)
MONOS PCT: 8 %
Monocytes Absolute: 1.2 10*3/uL — ABNORMAL HIGH (ref 0.1–1.0)
NEUTROS PCT: 64 %
Neutro Abs: 9.1 10*3/uL — ABNORMAL HIGH (ref 1.7–7.7)

## 2015-06-24 LAB — RAPID URINE DRUG SCREEN, HOSP PERFORMED
AMPHETAMINES: NOT DETECTED
BARBITURATES: NOT DETECTED
Benzodiazepines: NOT DETECTED
COCAINE: NOT DETECTED
OPIATES: NOT DETECTED
TETRAHYDROCANNABINOL: NOT DETECTED

## 2015-06-24 LAB — CBC
HEMATOCRIT: 32.5 % — AB (ref 39.0–52.0)
Hemoglobin: 9.9 g/dL — ABNORMAL LOW (ref 13.0–17.0)
MCH: 26.7 pg (ref 26.0–34.0)
MCHC: 30.5 g/dL (ref 30.0–36.0)
MCV: 87.6 fL (ref 78.0–100.0)
PLATELETS: 275 10*3/uL (ref 150–400)
RBC: 3.71 MIL/uL — ABNORMAL LOW (ref 4.22–5.81)
RDW: 15.7 % — AB (ref 11.5–15.5)
WBC: 14.2 10*3/uL — AB (ref 4.0–10.5)

## 2015-06-24 LAB — URINALYSIS, ROUTINE W REFLEX MICROSCOPIC
BILIRUBIN URINE: NEGATIVE
GLUCOSE, UA: NEGATIVE mg/dL
Hgb urine dipstick: NEGATIVE
KETONES UR: NEGATIVE mg/dL
Leukocytes, UA: NEGATIVE
Nitrite: NEGATIVE
PH: 5.5 (ref 5.0–8.0)
Protein, ur: NEGATIVE mg/dL
Specific Gravity, Urine: 1.01 (ref 1.005–1.030)

## 2015-06-24 LAB — COMPREHENSIVE METABOLIC PANEL
ALT: 9 U/L — ABNORMAL LOW (ref 17–63)
AST: 15 U/L (ref 15–41)
Albumin: 3.4 g/dL — ABNORMAL LOW (ref 3.5–5.0)
Alkaline Phosphatase: 69 U/L (ref 38–126)
Anion gap: 8 (ref 5–15)
BUN: 29 mg/dL — AB (ref 6–20)
CHLORIDE: 104 mmol/L (ref 101–111)
CO2: 28 mmol/L (ref 22–32)
Calcium: 9.8 mg/dL (ref 8.9–10.3)
Creatinine, Ser: 1.04 mg/dL (ref 0.61–1.24)
Glucose, Bld: 48 mg/dL — ABNORMAL LOW (ref 65–99)
POTASSIUM: 4.6 mmol/L (ref 3.5–5.1)
SODIUM: 140 mmol/L (ref 135–145)
Total Bilirubin: 0.2 mg/dL — ABNORMAL LOW (ref 0.3–1.2)
Total Protein: 7 g/dL (ref 6.5–8.1)

## 2015-06-24 LAB — CBG MONITORING, ED
GLUCOSE-CAPILLARY: 34 mg/dL — AB (ref 65–99)
Glucose-Capillary: 74 mg/dL (ref 65–99)

## 2015-06-24 LAB — GLUCOSE, CAPILLARY: Glucose-Capillary: 197 mg/dL — ABNORMAL HIGH (ref 65–99)

## 2015-06-24 LAB — TROPONIN I: Troponin I: <0.03 ng/mL (ref <0.031)

## 2015-06-24 LAB — PROTIME-INR
INR: 0.9 (ref 0.00–1.49)
PROTHROMBIN TIME: 12.4 s (ref 11.6–15.2)

## 2015-06-24 LAB — MRSA PCR SCREENING: MRSA by PCR: POSITIVE — AB

## 2015-06-24 LAB — ETHANOL

## 2015-06-24 LAB — APTT: APTT: 28 s (ref 24–37)

## 2015-06-24 MED ORDER — CLOPIDOGREL BISULFATE 75 MG PO TABS
75.0000 mg | ORAL_TABLET | Freq: Every day | ORAL | Status: DC
  Administered 2015-06-24 – 2015-06-26 (×3): 75 mg via ORAL
  Filled 2015-06-24 (×3): qty 1

## 2015-06-24 MED ORDER — LEVOTHYROXINE SODIUM 75 MCG PO TABS: 75.0000 ug | ORAL_TABLET | Freq: Every morning | ORAL | Status: DC

## 2015-06-24 MED ORDER — PRAVASTATIN SODIUM 40 MG PO TABS
80.0000 mg | ORAL_TABLET | Freq: Every day | ORAL | Status: DC
  Administered 2015-06-24 – 2015-06-26 (×3): 80 mg via ORAL
  Filled 2015-06-24: qty 1
  Filled 2015-06-24 (×4): qty 2
  Filled 2015-06-24: qty 1
  Filled 2015-06-24: qty 2

## 2015-06-24 MED ORDER — METOPROLOL TARTRATE 50 MG PO TABS
50.0000 mg | ORAL_TABLET | Freq: Two times a day (BID) | ORAL | Status: DC
  Administered 2015-06-24 – 2015-06-26 (×5): 50 mg via ORAL
  Filled 2015-06-24 (×5): qty 1

## 2015-06-24 MED ORDER — ACETAMINOPHEN 325 MG PO TABS: 650.0000 mg | ORAL_TABLET | Freq: Four times a day (QID) | ORAL | Status: DC | PRN

## 2015-06-24 MED ORDER — SODIUM CHLORIDE 0.9% FLUSH
3.0000 mL | Freq: Two times a day (BID) | INTRAVENOUS | Status: DC
  Administered 2015-06-24 – 2015-06-26 (×5): 3 mL via INTRAVENOUS

## 2015-06-24 MED ORDER — LEVOTHYROXINE SODIUM 75 MCG PO TABS
75.0000 ug | ORAL_TABLET | Freq: Every day | ORAL | Status: DC
  Administered 2015-06-24 – 2015-06-26 (×3): 75 ug via ORAL
  Filled 2015-06-24 (×3): qty 1

## 2015-06-24 MED ORDER — PANTOPRAZOLE SODIUM 40 MG PO TBEC
40.0000 mg | DELAYED_RELEASE_TABLET | Freq: Every day | ORAL | Status: DC
  Administered 2015-06-24 – 2015-06-26 (×3): 40 mg via ORAL
  Filled 2015-06-24 (×3): qty 1

## 2015-06-24 MED ORDER — METFORMIN HCL 500 MG PO TABS
500.0000 mg | ORAL_TABLET | Freq: Two times a day (BID) | ORAL | Status: DC
  Administered 2015-06-24 – 2015-06-26 (×4): 500 mg via ORAL
  Filled 2015-06-24 (×4): qty 1

## 2015-06-24 MED ORDER — ASPIRIN 325 MG PO TABS
325.0000 mg | ORAL_TABLET | Freq: Every day | ORAL | Status: DC
  Administered 2015-06-24 – 2015-06-26 (×3): 325 mg via ORAL
  Filled 2015-06-24 (×3): qty 1

## 2015-06-24 MED ORDER — STROKE: EARLY STAGES OF RECOVERY BOOK
Freq: Once | Status: DC
  Filled 2015-06-24: qty 1

## 2015-06-24 MED ORDER — METFORMIN HCL 500 MG PO TABS: 500.0000 mg | ORAL_TABLET | Freq: Two times a day (BID) | ORAL | Status: DC

## 2015-06-24 MED ORDER — SENNOSIDES-DOCUSATE SODIUM 8.6-50 MG PO TABS: 1.0000 | ORAL_TABLET | Freq: Every evening | ORAL | Status: DC | PRN

## 2015-06-24 MED ORDER — ALLOPURINOL 300 MG PO TABS
300.0000 mg | ORAL_TABLET | Freq: Every day | ORAL | Status: DC
  Administered 2015-06-24 – 2015-06-26 (×3): 300 mg via ORAL
  Filled 2015-06-24 (×3): qty 1

## 2015-06-24 MED ORDER — ACETAMINOPHEN 650 MG RE SUPP: 650.0000 mg | Freq: Four times a day (QID) | RECTAL | Status: DC | PRN

## 2015-06-24 MED ORDER — HEPARIN SODIUM (PORCINE) 5000 UNIT/ML IJ SOLN: 5000.0000 [IU] | Freq: Three times a day (TID) | INTRAMUSCULAR | Status: DC

## 2015-06-24 MED ORDER — LEVETIRACETAM 500 MG PO TABS
500.0000 mg | ORAL_TABLET | Freq: Two times a day (BID) | ORAL | Status: DC
  Administered 2015-06-24 – 2015-06-26 (×5): 500 mg via ORAL
  Filled 2015-06-24 (×5): qty 1

## 2015-06-24 MED ORDER — ALBUTEROL SULFATE (2.5 MG/3ML) 0.083% IN NEBU: 2.5000 mg | INHALATION_SOLUTION | RESPIRATORY_TRACT | Status: DC | PRN

## 2015-06-24 MED ORDER — ALBUTEROL SULFATE HFA 108 (90 BASE) MCG/ACT IN AERS
2.0000 | INHALATION_SPRAY | RESPIRATORY_TRACT | Status: DC | PRN
  Filled 2015-06-24: qty 6.7

## 2015-06-24 MED ORDER — STROKE: EARLY STAGES OF RECOVERY BOOK
Freq: Once | Status: AC
  Administered 2015-06-25: 1
  Filled 2015-06-24: qty 1

## 2015-06-24 MED ORDER — AMLODIPINE BESYLATE 5 MG PO TABS
10.0000 mg | ORAL_TABLET | Freq: Every day | ORAL | Status: DC
  Administered 2015-06-24 – 2015-06-26 (×3): 10 mg via ORAL
  Filled 2015-06-24 (×3): qty 2

## 2015-06-24 MED ORDER — PREDNISONE 20 MG PO TABS
60.0000 mg | ORAL_TABLET | Freq: Every day | ORAL | Status: DC
  Administered 2015-06-24 – 2015-06-26 (×3): 60 mg via ORAL
  Filled 2015-06-24 (×3): qty 3

## 2015-06-24 MED ORDER — DEXTROSE 50 % IV SOLN: 25.0000 mL | Freq: Once | INTRAVENOUS | Status: DC

## 2015-06-24 MED ORDER — DEXTROSE 50 % IV SOLN
1.0000 | Freq: Once | INTRAVENOUS | Status: AC
  Administered 2015-06-24: 50 mL via INTRAVENOUS

## 2015-06-24 MED ORDER — HEPARIN SODIUM (PORCINE) 5000 UNIT/ML IJ SOLN
5000.0000 [IU] | Freq: Three times a day (TID) | INTRAMUSCULAR | Status: DC
  Administered 2015-06-24 – 2015-06-25 (×5): 5000 [IU] via SUBCUTANEOUS
  Filled 2015-06-24 (×6): qty 1

## 2015-06-24 MED ORDER — DEXTROSE 50 % IV SOLN
INTRAVENOUS | Status: AC
Start: 2015-06-24 — End: 2015-06-24
  Administered 2015-06-24: 50 mL via INTRAVENOUS
  Filled 2015-06-24: qty 50

## 2015-06-24 NOTE — ED Provider Notes (Addendum)
CSN: [id number]     Arrival date & time 06/24/15  0542 History   First MD Initiated Contact with Patient 06/24/15 [phone]     Chief Complaint  Patient presents with  . Extremity Weakness     (Consider location/radiation/quality/duration/timing/severity/associated sxs/prior Treatment) HPI Comments: Patient presents to the ER for evaluation of right-sided weakness. Patient reports that he was awake this morning feeling like his normal self. He had been up for approximately 30 minutes when he noticed he was having trouble walking. He told his wife that he thought he was having another stroke. She brought him to the ER this morning. Patient did have a stroke in December of this past year. He was in rehabilitation until recently. He does have right-sided deficits from his previous stroke, however, reports that his weakness has significantly worsened this morning.  Patient is a 75 y.o. male presenting with extremity weakness.  Extremity Weakness    Past Medical History  Diagnosis Date  . GERD (gastroesophageal reflux disease)   . CAD (coronary artery disease)   . Obesity   . Hyperlipidemia   . Hypertension   . Chronic back pain   . Gout   . Chronic bronchitis ([place])     "get it q yr"  . Psoriasis   . COPD (chronic obstructive pulmonary disease) ([place])   . Myocardial infarction ([place]) 1999  . Pneumonia     "a few times; last time was ~ 06/2013"  . On home oxygen therapy     "2L; only at night" (08/30/2013)  . Diabetes mellitus, type 1   . Arthritis of knee   . Arthritis     "left leg" (08/30/2013)  . Vitamin B12 deficiency 05/01/2015  . Abnormality of gait 05/01/2015  . Tussive syncope 05/01/2015  . Stroke ([place])   . CVA (cerebral infarction)    Past Surgical History  Procedure Laterality Date  . Appendectomy    . Colonoscopy  03/17/2012    Procedure: COLONOSCOPY;  Surgeon: Rogene Houston, MD;  [facility];  Service: Endoscopy;  Laterality: N/A;  830  . Cystoscopy with  urethral dilatation N/A 11/26/2012    Procedure: CYSTOSCOPY WITH URETHRAL DILATATION;  Surgeon: Malka So, MD;  Location: [facility] ORS;  Service: Urology;  Laterality: N/A;  . Coronary angioplasty with stent placement  1999- 2009-08/30/2013    "counting today's, I have 5 stents" (08/30/2013)  . Left heart catheterization with coronary angiogram N/A 08/30/2013    Procedure: LEFT HEART CATHETERIZATION WITH CORONARY ANGIOGRAM;  Surgeon: Clent Demark, MD;  Location: [facility] CATH LAB;  Service: Cardiovascular;  Laterality: N/A;  . Percutaneous coronary stent intervention (pci-s) Right 08/30/2013    Procedure: PERCUTANEOUS CORONARY STENT INTERVENTION (PCI-S);  Surgeon: Clent Demark, MD;  Location: [facility] CATH LAB;  Service: Cardiovascular;  Laterality: Right;   Family History  Problem Relation Age of Onset  . Hypertension Mother   . Diabetes Mother   . Hypertension Father   . Diabetes Brother   . Arthritis      Family History   . Diabetes      family History   . Stroke Daughter    Social History  Substance Use Topics  . Smoking status: Former Smoker -- 0.50 packs/day for 33 years    Types: Cigarettes  . Smokeless tobacco: Never Used     Comment: "stopped smoking in the 1990's  . Alcohol Use: No    Review of Systems  Musculoskeletal: Positive for extremity weakness.  Neurological:  Positive for weakness and numbness.  All other systems reviewed and are negative.     Allergies  Review of patient's allergies indicates no known allergies.  Home Medications   Prior to Admission medications   Medication Sig Start Date End Date Taking? Authorizing Provider  acetaminophen (TYLENOL) 500 MG tablet Take 500 mg by mouth every 6 (six) hours as needed for moderate pain (left thigh).    Historical Provider, MD  albuterol (PROVENTIL HFA;VENTOLIN HFA) 108 (90 BASE) MCG/ACT inhaler Inhale 1-2 puffs into the lungs every 6 (six) hours as needed for wheezing or shortness of breath. 11/13/14   Nat Christen, MD   albuterol (PROVENTIL HFA;VENTOLIN HFA) 108 (90 Base) MCG/ACT inhaler Inhale 2 puffs into the lungs every 4 (four) hours as needed for wheezing or shortness of breath. 06/22/15   Kristen N Ward, DO  allopurinol (ZYLOPRIM) 300 MG tablet Take 300 mg by mouth daily. 05/02/13   Historical Provider, MD  amLODipine (NORVASC) 10 MG tablet Take 1 tablet (10 mg total) by mouth daily. 09/20/14   Fayrene Helper, MD  aspirin 325 MG tablet Take 1 tablet (325 mg total) by mouth daily. 05/12/15   Kathie Dike, MD  clopidogrel (PLAVIX) 75 MG tablet Take 1 tablet (75 mg total) by mouth daily. 05/18/15   Samuella Cota, MD  hydroxypropyl cellulose (LACRISERT) 5 MG INST Apply 5 mg to eye daily.    Historical Provider, MD  levETIRAcetam (KEPPRA) 500 MG tablet Take 1 tablet (500 mg total) by mouth 2 (two) times daily. 05/12/15   Kathie Dike, MD  levothyroxine (SYNTHROID, LEVOTHROID) 75 MCG tablet Take 75 mcg by mouth every morning.     Historical Provider, MD  Maltodextrin-Xanthan Gum ([place]) POWD As needed 05/18/15   Samuella Cota, MD  metFORMIN (GLUCOPHAGE) 500 MG tablet Take 1 tablet (500 mg total) by mouth 2 (two) times daily. 09/02/13   Charolette Forward, MD  metoprolol (LOPRESSOR) 50 MG tablet Take 0.5 tablets (25 mg total) by mouth 2 (two) times daily. Patient taking differently: Take 50 mg by mouth 2 (two) times daily.  05/18/15   Samuella Cota, MD  pantoprazole (PROTONIX) 40 MG tablet Take 1 tablet (40 mg total) by mouth daily before breakfast. 05/12/15   Kathie Dike, MD  pravastatin (PRAVACHOL) 40 MG tablet Take 2 tablets (80 mg total) by mouth daily. 05/18/15   Samuella Cota, MD  predniSONE (DELTASONE) 20 MG tablet Take 60 mg by mouth daily with breakfast.     Historical Provider, MD  senna (SENOKOT) 8.6 MG tablet Take 1 tablet by mouth daily.    Historical Provider, MD  vitamin B-12 (CYANOCOBALAMIN) 1000 MCG tablet Take 1,000 mcg by mouth daily.    Historical Provider, MD    BP 144/74 mmHg  Pulse 66  Temp(Src) 97.5 F (36.4 C) (Oral)  Resp 17  Ht 5' 10.5" (1.791 m)  Wt 240 lb (108.863 kg)  BMI 33.94 kg/m2  SpO2 94% Physical Exam  Constitutional: He is oriented to person, place, and time. He appears well-developed and well-nourished. No distress.  HENT:  Head: Normocephalic and atraumatic.  Right Ear: Hearing normal.  Left Ear: Hearing normal.  Nose: Nose normal.  Mouth/Throat: Oropharynx is clear and moist and mucous membranes are normal.  Eyes: Conjunctivae and EOM are normal. Pupils are equal, round, and reactive to light.  Neck: Normal range of motion. Neck supple.  Cardiovascular: Regular rhythm, S1 normal and S2 normal.  Exam reveals no gallop and  no friction rub.   No murmur heard. Pulmonary/Chest: Effort normal and breath sounds normal. No respiratory distress. He exhibits no tenderness.  Abdominal: Soft. Normal appearance and bowel sounds are normal. There is no hepatosplenomegaly. There is no tenderness. There is no rebound, no guarding, no tenderness at McBurney's point and negative Murphy's sign. No hernia.  Musculoskeletal: Normal range of motion.  Neurological: He is alert and oriented to person, place, and time. He has normal strength. A cranial nerve deficit (left facial droop) and sensory deficit is present. He exhibits abnormal muscle tone. Coordination normal. GCS eye subscore is 4. GCS verbal subscore is 5. GCS motor subscore is 6.  Extraocular muscle movement: normal No visual field cut Pupils: equal and reactive both direct and consensual response is normal No nystagmus present    Sensory function is decreased to light touch, pinprick Right upper and lower extremity  RUE Grip strength 3/5; LUE Grip Strength 5/5  RUE +pronator drift RUE ataxia with finger to nose bilaterally  RLE strength 2+/5 against gravity LLE strength 5/5     Skin: Skin is warm, dry and intact. No rash noted. No cyanosis.  Psychiatric: He has a  normal mood and affect. His speech is normal and behavior is normal. Thought content normal.  Nursing note and vitals reviewed.   ED Course  Procedures (including critical care time) Labs Review Labs Reviewed  CBC - Abnormal; Notable for the following:    WBC 14.2 (*)    RBC 3.71 (*)    Hemoglobin 9.9 (*)    HCT 32.5 (*)    RDW 15.7 (*)    All other components within normal limits  DIFFERENTIAL - Abnormal; Notable for the following:    Neutro Abs 9.1 (*)    Monocytes Absolute 1.2 (*)    All other components within normal limits  COMPREHENSIVE METABOLIC PANEL - Abnormal; Notable for the following:    Glucose, Bld 48 (*)    BUN 29 (*)    Albumin 3.4 (*)    ALT 9 (*)    Total Bilirubin 0.2 (*)    All other components within normal limits  PROTIME-INR  APTT  ETHANOL  URINE RAPID DRUG SCREEN, HOSP PERFORMED  URINALYSIS, ROUTINE W REFLEX MICROSCOPIC (NOT AT [facility])  TROPONIN I    Imaging Review Ct Head Wo Contrast  06/24/2015  CLINICAL DATA:  Right-sided weakness beginning 30 minutes prior to presentation. EXAM: CT HEAD WITHOUT CONTRAST TECHNIQUE: Contiguous axial images were obtained from the base of the skull through the vertex without intravenous contrast. COMPARISON:  05/15/2015 FINDINGS: Skull and Sinuses:Negative for fracture or destructive process. Visualized orbits: Negative. Brain: No acute intracranial hemorrhage or visible acute infarct. There are known small vessel infarcts in the left pons, bilateral thalamus, and possibly in the posterior limb left internal capsule (versus dilated perivascular space). ASPECTS is 10. There is small-vessel ischemic gliosis in the deep cerebral white matter, worse in the parietal region. No hydrocephalus. Extensive atherosclerotic calcification. No hydrocephalus. Critical Value/emergent results were called by telephone at the time of interpretation on 06/24/2015 at 6:25 am to Dr. Joseph Berkshire , who verbally acknowledged these results.  IMPRESSION: 1. No acute hemorrhage or visible acute infarct. 2. Chronic small vessel disease with remote pontine and bilateral thalamic infarcts. Electronically Signed   By: Monte Fantasia M.D.   On: 06/24/2015 06:27   I have personally reviewed and evaluated these images and lab results as part of my medical decision-making.   EKG Interpretation  Date/Time:  Sunday June 24 2015 05:55:20 EST Ventricular Rate:  66 PR Interval:  196 QRS Duration: 74 QT Interval:  411 QTC Calculation: 431 R Axis:   22 Text Interpretation:  Sinus arrhythmia Ventricular premature complex  Otherwise within normal limits Confirmed by POLLINA  MD, CHRISTOPHER  [phone]) on 06/24/2015 6:00:16 AM      MDM   Final diagnoses:  None   right-sided numbness and weakness  hypoglycemia  Patient presents to the ER for evaluation of possible stroke. Patient reports new weakness and numbness on the right side. He did recently have a stroke with similar distribution. He had fairly significant weakness initially with a stroke last month, but with rehabilitation has significantly improved. At arrival to the ER today he did have ataxia of the right upper extremity weakness of the right lower extremity with numbness and decreased sensory function. It is not, however, clear what his baseline is after the most recent stroke. He initially indicated to me that the weakness was worse. He could tell me that he was awake when the symptoms began, it was felt that the symptoms had started approximately an hour or so before arrival to the ER. Code stroke was therefore initiated.  On-call neurology specialist evaluated the patient by video monitor. He has not recommended intervention at this time. He is concerned that this might have been a seizure with residual symptoms, but cannot rule out extension of stroke. Recommend admission to the hospital for further management.  Patient's blood work did return with hypoglycemia. This was  treated with IV dextrose. I did reevaluate the patient prior to administration of the dextrose. Patient's weakness is unchanged, but he reports that his numbness has improved. Recheck after administration of IV dextrose reveals that he is unchanged. Unclear if his hypoglycemia was contributory to his symptoms at all.  I did have a discussion with the patient about disposition. While we are recommending admission to the hospital, MRI and further neurologic evaluation cannot be performed today because it is Sunday. He does understand this, does not wish to be transferred to [facility]. I do feel it is appropriate to monitor him here in the hospital at [facility], he will not require urgent or emergent MRI or intervention.    Orpah Greek, MD 06/24/15 [place], MD 06/24/15 [phone]

## 2015-06-24 NOTE — ED Notes (Signed)
Patient transported to CT 

## 2015-06-24 NOTE — ED Notes (Signed)
Pt c/o right side weakness that started about 30 minutes ago per pt, pt is unsure about symptom start time, when asked about start time pt will say 20 minutes ago, when asked again pt will say he woke up with right side weakness,

## 2015-06-24 NOTE — H&P (Signed)
[facility] History and Physical  [name] [id number] DOB: 24-Apr-1941 DOA: 06/24/2015  Referring physician: Emergency Department PCP: Tula Nakayama, MD  Specialists:   Chief Complaint: R sided numbness/weakness  HPI: [name] is a 75 y.o. male with hx of L sided CVA with extension in 12/16, CAD, copd, DM who presented to the emergency department complaints of new right-sided numbness and weakness. The patient presented to the emergency department where Code stroke was activated. CT head was unremarkable. Case was discussed with telemetry neurologist, who recommends repeat MRI and continued observation. At present, the patient continues complaint of right-sided numbness and reports difficulty raising his right leg off the bed, which is a new finding per the patient and his family. Hospitalist service was consulted for consideration for admission.  Review of Systems:  Review of Systems  Constitutional: Negative for fever, chills and malaise/fatigue.  HENT: Negative for ear pain, sore throat and tinnitus.   Eyes: Negative for pain and discharge.  Respiratory: Negative for hemoptysis and wheezing.   Cardiovascular: Negative for chest pain and palpitations.  Gastrointestinal: Negative for nausea, vomiting and abdominal pain.  Musculoskeletal: Negative for back pain, joint pain and neck pain.  Neurological: Positive for sensory change and focal weakness. Negative for tremors and loss of consciousness.  Psychiatric/Behavioral: Negative for hallucinations and memory loss.     Past Medical History  Diagnosis Date  . GERD (gastroesophageal reflux disease)   . CAD (coronary artery disease)   . Obesity   . Hyperlipidemia   . Hypertension   . Chronic back pain   . Gout   . Chronic bronchitis ([place])     "get it q yr"  . Psoriasis   . COPD (chronic obstructive pulmonary disease) ([place])   . Myocardial infarction ([place]) 1999  . Pneumonia     "a few times; last time was  ~ 06/2013"  . On home oxygen therapy     "2L; only at night" (08/30/2013)  . Diabetes mellitus, type 1   . Arthritis of knee   . Arthritis     "left leg" (08/30/2013)  . Vitamin B12 deficiency 05/01/2015  . Abnormality of gait 05/01/2015  . Tussive syncope 05/01/2015  . Stroke ([place])   . CVA (cerebral infarction)    Past Surgical History  Procedure Laterality Date  . Appendectomy    . Colonoscopy  03/17/2012    Procedure: COLONOSCOPY;  Surgeon: Rogene Houston, MD;  [facility];  Service: Endoscopy;  Laterality: N/A;  830  . Cystoscopy with urethral dilatation N/A 11/26/2012    Procedure: CYSTOSCOPY WITH URETHRAL DILATATION;  Surgeon: Malka So, MD;  Location: [facility] ORS;  Service: Urology;  Laterality: N/A;  . Coronary angioplasty with stent placement  1999- 2009-08/30/2013    "counting today's, I have 5 stents" (08/30/2013)  . Left heart catheterization with coronary angiogram N/A 08/30/2013    Procedure: LEFT HEART CATHETERIZATION WITH CORONARY ANGIOGRAM;  Surgeon: Clent Demark, MD;  Location: [facility] CATH LAB;  Service: Cardiovascular;  Laterality: N/A;  . Percutaneous coronary stent intervention (pci-s) Right 08/30/2013    Procedure: PERCUTANEOUS CORONARY STENT INTERVENTION (PCI-S);  Surgeon: Clent Demark, MD;  Location: [facility] CATH LAB;  Service: Cardiovascular;  Laterality: Right;   Social History:  reports that he has quit smoking. His smoking use included Cigarettes. He has a 16.5 pack-year smoking history. He has never used smokeless tobacco. He reports that he does not drink alcohol or use illicit drugs.  where does  patient live--home, ALF, SNF? and with whom if at home?  Can patient participate in ADLs?  No Known Allergies  Family History  Problem Relation Age of Onset  . Hypertension Mother   . Diabetes Mother   . Hypertension Father   . Diabetes Brother   . Arthritis      Family History   . Diabetes      family History   . Stroke Daughter      Prior to  Admission medications   Medication Sig Start Date End Date Taking? Authorizing Provider  acetaminophen (TYLENOL) 500 MG tablet Take 500 mg by mouth every 6 (six) hours as needed for moderate pain (left thigh).    Historical Provider, MD  albuterol (PROVENTIL HFA;VENTOLIN HFA) 108 (90 BASE) MCG/ACT inhaler Inhale 1-2 puffs into the lungs every 6 (six) hours as needed for wheezing or shortness of breath. 11/13/14   Nat Christen, MD  albuterol (PROVENTIL HFA;VENTOLIN HFA) 108 (90 Base) MCG/ACT inhaler Inhale 2 puffs into the lungs every 4 (four) hours as needed for wheezing or shortness of breath. 06/22/15   Kristen N Ward, DO  allopurinol (ZYLOPRIM) 300 MG tablet Take 300 mg by mouth daily. 05/02/13   Historical Provider, MD  amLODipine (NORVASC) 10 MG tablet Take 1 tablet (10 mg total) by mouth daily. 09/20/14   Fayrene Helper, MD  aspirin 325 MG tablet Take 1 tablet (325 mg total) by mouth daily. 05/12/15   Kathie Dike, MD  clopidogrel (PLAVIX) 75 MG tablet Take 1 tablet (75 mg total) by mouth daily. 05/18/15   Samuella Cota, MD  hydroxypropyl cellulose (LACRISERT) 5 MG INST Apply 5 mg to eye daily.    Historical Provider, MD  levETIRAcetam (KEPPRA) 500 MG tablet Take 1 tablet (500 mg total) by mouth 2 (two) times daily. 05/12/15   Kathie Dike, MD  levothyroxine (SYNTHROID, LEVOTHROID) 75 MCG tablet Take 75 mcg by mouth every morning.     Historical Provider, MD  Maltodextrin-Xanthan Gum ([place]) POWD As needed 05/18/15   Samuella Cota, MD  metFORMIN (GLUCOPHAGE) 500 MG tablet Take 1 tablet (500 mg total) by mouth 2 (two) times daily. 09/02/13   Charolette Forward, MD  metoprolol (LOPRESSOR) 50 MG tablet Take 0.5 tablets (25 mg total) by mouth 2 (two) times daily. Patient taking differently: Take 50 mg by mouth 2 (two) times daily.  05/18/15   Samuella Cota, MD  pantoprazole (PROTONIX) 40 MG tablet Take 1 tablet (40 mg total) by mouth daily before breakfast. 05/12/15    Kathie Dike, MD  pravastatin (PRAVACHOL) 40 MG tablet Take 2 tablets (80 mg total) by mouth daily. 05/18/15   Samuella Cota, MD  predniSONE (DELTASONE) 20 MG tablet Take 60 mg by mouth daily with breakfast.     Historical Provider, MD  senna (SENOKOT) 8.6 MG tablet Take 1 tablet by mouth daily.    Historical Provider, MD  vitamin B-12 (CYANOCOBALAMIN) 1000 MCG tablet Take 1,000 mcg by mouth daily.    Historical Provider, MD   Physical Exam: Filed Vitals:   06/24/15 0630 06/24/15 0649 06/24/15 0700 06/24/15 0715  BP: 144/74 137/72 137/73 124/66  Pulse: 66 68 62 64  Temp:      TempSrc:      Resp: 17 16 16 15   Height:      Weight:      SpO2: 94% 92% 88% 95%     General:  Awake, in nad  Eyes: PERRL B  ENT:  membranes moist, dentition fair  Neck: trachea midline, neck supple  Cardiovascular: regular, s1, s2  Respiratory: normal resp effort, no wheezing  Abdomen: soft, nondistended  Skin: normal skin turgor, no abnormal skin lesions seen  Musculoskeletal: perfused, no clubbing  Psychiatric: mood/affect norma// no auditory/visual hallucinations  Neurologic: residual R sided facial droop, 4/5 RUE strength, 3/5 RLE strength (difficulty lifting off bed, new finding), decreased R sided sensation  Labs on Admission:  Basic Metabolic Panel:  Recent Labs Lab 06/22/15 0035 06/24/15 0554  NA 141 140  K 4.9 4.6  CL 105 104  CO2 28 28  GLUCOSE 184* 48*  BUN 23* 29*  CREATININE 1.36* 1.04  CALCIUM 9.7 9.8   Liver Function Tests:  Recent Labs Lab 06/24/15 0554  AST 15  ALT 9*  ALKPHOS 69  BILITOT 0.2*  PROT 7.0  ALBUMIN 3.4*   No results for input(s): LIPASE, AMYLASE in the last 168 hours. No results for input(s): AMMONIA in the last 168 hours. CBC:  Recent Labs Lab 06/22/15 0035 06/24/15 0554  WBC 12.3* 14.2*  NEUTROABS 8.5* 9.1*  HGB 9.2* 9.9*  HCT 29.7* 32.5*  MCV 88.1 87.6  PLT 242 275   Cardiac Enzymes:  Recent Labs Lab 06/22/15 0035  06/24/15 0554  TROPONINI <0.03 <0.03    BNP (last 3 results)  Recent Labs  04/18/15 2200 06/22/15 0035  BNP 22.0 29.0    ProBNP (last 3 results) No results for input(s): PROBNP in the last 8760 hours.  CBG:  Recent Labs Lab 06/24/15 0712  GLUCAP 34*    Radiological Exams on Admission: Ct Head Wo Contrast  06/24/2015  CLINICAL DATA:  Right-sided weakness beginning 30 minutes prior to presentation. EXAM: CT HEAD WITHOUT CONTRAST TECHNIQUE: Contiguous axial images were obtained from the base of the skull through the vertex without intravenous contrast. COMPARISON:  05/15/2015 FINDINGS: Skull and Sinuses:Negative for fracture or destructive process. Visualized orbits: Negative. Brain: No acute intracranial hemorrhage or visible acute infarct. There are known small vessel infarcts in the left pons, bilateral thalamus, and possibly in the posterior limb left internal capsule (versus dilated perivascular space). ASPECTS is 10. There is small-vessel ischemic gliosis in the deep cerebral white matter, worse in the parietal region. No hydrocephalus. Extensive atherosclerotic calcification. No hydrocephalus. Critical Value/emergent results were called by telephone at the time of interpretation on 06/24/2015 at 6:25 am to Dr. Joseph Berkshire , who verbally acknowledged these results. IMPRESSION: 1. No acute hemorrhage or visible acute infarct. 2. Chronic small vessel disease with remote pontine and bilateral thalamic infarcts. Electronically Signed   By: Monte Fantasia M.D.   On: 06/24/2015 06:27   Assessment/Plan Principal Problem:   Stroke [facility]) Active Problems:   Diabetes mellitus, insulin dependent (IDDM), uncontrolled ([place])   Essential hypertension   Coronary atherosclerosis   COPD (chronic obstructive pulmonary disease) (HCC)   Type 2 diabetes mellitus with diabetic nephropathy (HCC)   CVA (cerebral infarction)   Dysarthria   Right sided weakness   CKD (chronic kidney disease)  stage 2, GFR 60-89 ml/min   1. Stroke 1. Prior L sided CVA in 12/16 with re-admission with extension of stroke 2. Symptoms started around 6am on day of admit with R sided numbness and weakness, still with residual R LE weakness (difficulty lifting off bed) 3. Tele-neuro called in ED after code stroke called, recommended observation 4. Also discussed case with [facility] Neurologist 5. Will admit pt to med-tele 6. Obtain repeat MRI brain 7. 2d echo and carotids  recently done thus will not re-order 8. PT/OT/SLP 9. Cont ASA and plavix 2. DM2 1. Cont SSI coverage 2. Cont metformin 3. HTN 1. BP stable and controlled at this time 4. CAD 1. Stable 2. No chest pain 3. Cont ASA and plavix 5. COPD 1. Stable 2. No wheezing on exam 6. DVT prophylaxis 1. Heparin subQ  Code Status: Full Family Communication: Pt in room, family at bedside Disposition Plan: admit med-tele   Donne Hazel [facility] Pager [phone]  If 7PM-7AM, please contact night-coverage [email or web address] Password [email or web address] 06/24/2015, 8:20 AM

## 2015-06-24 NOTE — Progress Notes (Signed)
0554 order entered (doesn't say code stroke) [phone] printed order off in [place] [facility] called are ready for code stroke? 0603 beeper for code stroke 0610 entered CT with transporter and RN 0612 scan finished [phone] finished scan in EPIC/ called [place] [facility] called report to [place] report finalized in EPIC

## 2015-06-24 NOTE — ED Notes (Signed)
Tele neuro being performed

## 2015-06-24 NOTE — Evaluation (Signed)
Clinical/Bedside Swallow Evaluation Patient Details  Name: AKHILLES MCTEE MRN: [id number] Date of Birth: Jan 13, 1941  Today's Date: 06/24/2015 Time: SLP Start Time (ACUTE ONLY): [id number] SLP Stop Time (ACUTE ONLY): 1420 SLP Time Calculation (min) (ACUTE ONLY): 35 min  Past Medical History:  Past Medical History  Diagnosis Date  . GERD (gastroesophageal reflux disease)   . CAD (coronary artery disease)   . Obesity   . Hyperlipidemia   . Hypertension   . Chronic back pain   . Gout   . Chronic bronchitis ([place])     "get it q yr"  . Psoriasis   . COPD (chronic obstructive pulmonary disease) ([place])   . Myocardial infarction ([place]) 1999  . Pneumonia     "a few times; last time was ~ 06/2013"  . On home oxygen therapy     "2L; only at night" (08/30/2013)  . Diabetes mellitus, type 1   . Arthritis of knee   . Arthritis     "left leg" (08/30/2013)  . Vitamin B12 deficiency 05/01/2015  . Abnormality of gait 05/01/2015  . Tussive syncope 05/01/2015  . Stroke ([place])   . CVA (cerebral infarction)    Past Surgical History:  Past Surgical History  Procedure Laterality Date  . Appendectomy    . Colonoscopy  03/17/2012    Procedure: COLONOSCOPY;  Surgeon: Rogene Houston, MD;  Location: [facility] ENDO SU[facility];  Service: Endoscopy;  Laterality: N/A;  830  . Cystoscopy with urethral dilatation N/A 11/26/2012    Procedure: CYSTOSCOPY WITH URETHRAL DILATATION;  Surgeon: Malka So, MD;  Location: [facility] ORS;  Service: Urology;  Laterality: N/A;  . Coronary angioplasty with stent placement  1999- 2009-08/30/2013    "counting today's, I have 5 stents" (08/30/2013)  . Left heart catheterization with coronary angiogram N/A 08/30/2013    Procedure: LEFT HEART CATHETERIZATION WITH CORONARY ANGIOGRAM;  Surgeon: Clent Demark, MD;  Location: [facility] CATH LAB;  Service: Cardiovascular;  Laterality: N/A;  . Percutaneous coronary stent intervention (pci-s) Right 08/30/2013    Procedure: PERCUTANEOUS CORONARY STENT INTERVENTION  (PCI-S);  Surgeon: Clent Demark, MD;  Location: [facility] CATH LAB;  Service: Cardiovascular;  Laterality: Right;   HPI:  TASON LARMORE is a 75 y.o. male with hx of L sided CVA with extension in 12/16, CAD, copd, DM who presented to the emergency department complaints of new right-sided numbness and weakness. The patient presented to the emergency department where Code stroke was activated. CT head was unremarkable. Case was discussed with telemetry neurologist, who recommends repeat MRI and continued observation. At present, the patient continues complaint of right-sided numbness and reports difficulty raising his right leg off the bed, which is a new finding per the patient and his family.   Assessment / Plan / Recommendation Clinical Impression  Pt presents with mild oropharyngeal dysphagia; oral mechanism exam reveals left facial flaccidity and decreased left labial strength secondary to old stroke and no change in oral strength or sensation from baseline. SLP is familiar with pt hx and baseline from previous admission and treatment after L sided CVA with extention 12/16; CT from this admission is unremarkable for acute changes although MRI has been ordered. Pt tolerated all presented trials including mech soft, puree, regular solids, ice chips and thin liquids via cup with no overt s/sx of aspiration. Pt recalled and utilized strategies from previous ST dysphagia interventions during all trials. Recommend D3 (mech soft)/ Thin liquids with NO STRAWS and continue implementation of strategies including slow rate,  small bites/sips, lingual sweep to clear left sulcus pocketing, limiting talking during PO intake, and multiple swallows. Meds to be crushed in puree'. ST to follow pending results of ordered MRI and to ensure continued tolerance of recommended diet.     Aspiration Risk  Mild aspiration risk    Diet Recommendation Dysphagia 3 (Mech soft);Thin liquid   Liquid Administration via: Cup;No  straw Medication Administration: Crushed with puree Supervision: Patient able to self feed Compensations: Slow rate;Small sips/bites;Minimize environmental distractions;Lingual sweep for clearance of pocketing;Multiple dry swallows after each bite/sip;Clear throat intermittently Postural Changes: Seated upright at 90 degrees;Remain upright for at least 30 minutes after po intake    Other  Recommendations Oral Care Recommendations: Oral care BID   Follow up Recommendations       Frequency and Duration min 2x/week  2 weeks       Prognosis Prognosis for Safe Diet Advancement: [place] Date of Onset: 06/24/15 HPI: URBAN CHILCOTE is a 75 y.o. male with hx of L sided CVA with extension in 12/16, CAD, copd, DM who presented to the emergency department complaints of new right-sided numbness and weakness. The patient presented to the emergency department where Code stroke was activated. CT head was unremarkable. Case was discussed with telemetry neurologist, who recommends repeat MRI and continued observation. At present, the patient continues complaint of right-sided numbness and reports difficulty raising his right leg off the bed, which is a new finding per the patient and his family. Type of Study: Bedside Swallow Evaluation Previous Swallow Assessment: BSE and MBS Diet Prior to this Study: NPO Temperature Spikes Noted: No Respiratory Status: Room air Behavior/Cognition: Alert;Cooperative;Pleasant mood Oral Cavity Assessment: Within Functional Limits Oral Care Completed by SLP: No Oral Cavity - Dentition: Missing dentition Vision: Functional for self-feeding Self-Feeding Abilities: Able to feed self Patient Positioning: Upright in bed Baseline Vocal Quality: Normal Volitional Cough: Strong Volitional Swallow: Able to elicit    Oral/Motor/Sensory Function Overall Oral Motor/Sensory Function: Within functional limits   Ice Chips Ice chips: Within functional  limits   Thin Liquid Thin Liquid: Within functional limits    Nectar Thick Nectar Thick Liquid: Not tested   Honey Thick Honey Thick Liquid: Not tested   Puree Puree: Within functional limits   Solid   Eden Toohey H. Roddie Mc, CCC-SLP Speech Language Pathologist   Solid: Within functional limits        Wende Bushy 06/24/2015,2:29 PM

## 2015-06-25 ENCOUNTER — Inpatient Hospital Stay (HOSPITAL_COMMUNITY): Payer: Commercial Managed Care - HMO

## 2015-06-25 DIAGNOSIS — N182 Chronic kidney disease, stage 2 (mild): Secondary | ICD-10-CM

## 2015-06-25 LAB — CBC
HCT: 31.6 % — ABNORMAL LOW (ref 39.0–52.0)
Hemoglobin: 10 g/dL — ABNORMAL LOW (ref 13.0–17.0)
MCH: 27.2 pg (ref 26.0–34.0)
MCHC: 31.6 g/dL (ref 30.0–36.0)
MCV: 85.9 fL (ref 78.0–100.0)
PLATELETS: 279 10*3/uL (ref 150–400)
RBC: 3.68 MIL/uL — ABNORMAL LOW (ref 4.22–5.81)
RDW: 15.7 % — AB (ref 11.5–15.5)
WBC: 12.8 10*3/uL — AB (ref 4.0–10.5)

## 2015-06-25 LAB — COMPREHENSIVE METABOLIC PANEL
ALK PHOS: 60 U/L (ref 38–126)
ALT: 11 U/L — AB (ref 17–63)
AST: 14 U/L — AB (ref 15–41)
Albumin: 3.2 g/dL — ABNORMAL LOW (ref 3.5–5.0)
Anion gap: 9 (ref 5–15)
BILIRUBIN TOTAL: 0.4 mg/dL (ref 0.3–1.2)
BUN: 31 mg/dL — AB (ref 6–20)
CHLORIDE: 101 mmol/L (ref 101–111)
CO2: 27 mmol/L (ref 22–32)
CREATININE: 1.1 mg/dL (ref 0.61–1.24)
Calcium: 9.1 mg/dL (ref 8.9–10.3)
GFR calc Af Amer: >60 mL/min (ref >60)
Glucose, Bld: 150 mg/dL — ABNORMAL HIGH (ref 65–99)
Potassium: 5 mmol/L (ref 3.5–5.1)
Sodium: 137 mmol/L (ref 135–145)
Total Protein: 6.7 g/dL (ref 6.5–8.1)

## 2015-06-25 LAB — LIPID PANEL
CHOLESTEROL: 133 mg/dL (ref 0–200)
HDL: 26 mg/dL — ABNORMAL LOW (ref >40)
LDL CALC: 72 mg/dL (ref 0–99)
TRIGLYCERIDES: 175 mg/dL — AB (ref <150)
Total CHOL/HDL Ratio: 5.1 RATIO
VLDL: 35 mg/dL (ref 0–40)

## 2015-06-25 MED ORDER — MUPIROCIN 2 % EX OINT
1.0000 "application " | TOPICAL_OINTMENT | Freq: Two times a day (BID) | CUTANEOUS | Status: DC
  Administered 2015-06-25 – 2015-06-26 (×3): 1 via NASAL
  Filled 2015-06-25: qty 22

## 2015-06-25 MED ORDER — CHLORHEXIDINE GLUCONATE CLOTH 2 % EX PADS
6.0000 | MEDICATED_PAD | Freq: Every day | CUTANEOUS | Status: DC
  Administered 2015-06-25: 6 via TOPICAL

## 2015-06-25 NOTE — Care Management Note (Signed)
Case Management Note  Patient Details  Name: [name] MRN: [id number] Date of Birth: [birth year]  Subjective/Objective:                  Pt is from home, lives with wife and was recently DC'd from SNF for rehab following a CVA. Pt has cane, walker, BSC, shower bench. Pt is active with [facility] for [facility] services. Pt plans to return home with resumption of [place] services at DC. Romualdo Bolk, of [facility], made aware of admission and DC plan.  Action/Plan: Will cont to follow and update [facility] of DC. No new needs anticipated.   Expected Discharge Date:    06/26/2015              Expected Discharge Plan:  [place]  In-House Referral:  NA  Discharge planning Services  CM Consult  Post Acute Care Choice:  Resumption of Svcs/PTA Provider Choice offered to:  Patient  DME Arranged:    DME Agency:     HH Arranged:    [place] Agency:     Status of Service:  In process, will continue to follow  Medicare Important Message Given:    Date Medicare IM Given:    Medicare IM give by:    Date Additional Medicare IM Given:    Additional Medicare Important Message give by:     If discussed at [place] of Stay Meetings, dates discussed:    Additional Comments:  Sherald Barge, RN 06/25/2015, 2:21 PM

## 2015-06-25 NOTE — Evaluation (Signed)
Physical Therapy Evaluation Patient Details Name: [name] MRN: [id number] DOB: [birth year] Today's Date: 06/25/2015   History of Present Illness  [name] is a 75 y.o. male with hx of L sided CVA with extension in 12/16, CAD, copd, DM who presented to the emergency department complaints of new right-sided numbness and weakness. The patient presented to the emergency department where Code stroke was activated. CT head was unremarkable. Case was discussed with telemetry neurologist, who recommends repeat MRI and continued observation. At present, the patient continues complaint of right-sided numbness and reports difficulty raising his right leg off the bed, which is a new finding per the patient and his family. Hospitalist service was consulted for consideration for admission  Clinical Impression  Pt verbalizes that he feels much better.  Pt states that he just got back home from SNF.  Pt ambulated with therapist 200 ft with mod I which pt states is longer than he ever ambulated at the [facility].  Pt is at prior level of care and does not need skilled PT at this time.     Follow Up Recommendations No PT follow up    Equipment Recommendations   none    Recommendations for Other Services  none     Precautions / Restrictions Precautions Precautions: None Restrictions Weight Bearing Restrictions: No      Mobility  Bed Mobility Overal bed mobility: Modified Independent       Supine to sit: Modified independent (Device/Increase time)        Transfers Overall transfer level: Modified independent Equipment used: Standard walker (due to rollling walker not being available ) Transfers: Stand Pivot Transfers Sit to Stand: Modified independent (Device/Increase time)            Ambulation/Gait Ambulation/Gait assistance: Modified independent (Device/Increase time) Ambulation Distance (Feet): 200 Feet Assistive device: Standard walker Gait Pattern/deviations:  WFL(Within Functional Limits)   Gait velocity interpretation: at or above normal speed for age/gender    Stairs                     Pertinent Vitals/Pain Pain Assessment: No/denies pain    Home Living Family/patient expects to be discharged to:: Private residence Living Arrangements: Spouse/significant other Available Help at Discharge: Family Type of Home: House       Home Layout: One level Home Equipment: Kasandra Knudsen - single point;Walker - 4 wheels;Walker - 2 wheels      Prior Function Level of Independence: Independent with assistive device(s)               Hand Dominance   Dominant Hand: Left    Extremity/Trunk Assessment               Lower Extremity Assessment: Overall WFL for tasks assessed         Communication   Communication: No difficulties  Cognition Arousal/Alertness: Awake/alert Behavior During Therapy: WFL for tasks assessed/performed Overall Cognitive Status: Within Functional Limits for tasks assessed                               Assessment/Plan    PT Assessment Patent does not need any further PT services  PT Diagnosis     PT Problem List    PT Treatment Interventions     PT Goals (Current goals can be found in the Care Plan section)  End of Session Equipment Utilized During Treatment: Gait belt Activity Tolerance: Patient tolerated treatment well Patient left: in chair;with call bell/phone within reach;with nursing/sitter in room      Functional Limitation: Mobility: Walking and moving around Mobility: Walking and Moving Around Current Status [id number]): At least 1 percent but less than 20 percent impaired, limited or restricted Mobility: Walking and Moving Around Goal Status [phone]): At least 1 percent but less than 20 percent impaired, limited or restricted Mobility: Walking and Moving Around Discharge Status [phone]): At least 1 percent but less than 20 percent impaired, limited or  restricted    Time: 0910-0940 PT Time Calculation (min) (ACUTE ONLY): 30 min   Charges:   PT Evaluation $PT Eval Moderate Complexity: 1 Procedure     PT G Codes:   PT G-Codes **NOT FOR INPATIENT CLASS** Functional Limitation: Mobility: Walking and moving around Mobility: Walking and Moving Around Current Status [id number]): At least 1 percent but less than 20 percent impaired, limited or restricted Mobility: Walking and Moving Around Goal Status [phone]): At least 1 percent but less than 20 percent impaired, limited or restricted Mobility: Walking and Moving Around Discharge Status [phone]): At least 1 percent but less than 20 percent impaired, limited or restricted  Rayetta Humphrey, PT CLT [phone] 06/25/2015, 9:44 AM

## 2015-06-25 NOTE — Progress Notes (Signed)
OT Cancellation Note  Patient Details Name: RAQUEL VANSON MRN: [id number] DOB: 1940/11/11   Cancelled Treatment:    Reason Eval/Treat Not Completed: Other (comment). Pt unavailable to be seen at OT for evaluation. Pt washing up for the day when OT attempted to see patient. Will re-attempt at a later date.   Ailene Ravel, OTR/L,CBIS  [phone]  06/25/2015, 9:20 AM

## 2015-06-25 NOTE — Progress Notes (Signed)
CRITICAL VALUE ALERT  Critical value received:  MRSA PCR positive  Date of notification:  06/24/15  Time of notification:  2312  Critical value read back:Yes.    Nurse who received alert:  Concha Pyo, RN  MD notified (1st page):  n/a  Time of first page:    MD notified (2nd page):  Time of second page:  Responding MD:   Time MD responded:  n/a  Standing orders initiated per protocol in [facility].

## 2015-06-25 NOTE — Consult Note (Signed)
[place] A. Merlene Laughter, MD     [email or web address]          [name] is an 75 y.o. male.   ASSESSMENT/PLAN: Reactivation of previous stroke symptoms due to moderately severe hypoglycemia. Prior pontine infarct due to perforating thrombosis. Risk factors hypertension, diabetes and age. Unexplained dysphagia over the last several months with difficulty swallowing liquids.  Cough and swallowing due to induced syncope.  Gait impairment.  Vitamin B12 deficiency.  Pseudobulbar affect.  Likely obstructive sleep apnea syndrome.   The patient should continue with dual antiplatelet agent for 3 months. Subsequently, a single agent should be done.    The patient is 75 year old black male who has had history of right hemiparesis from a pontine infarct a few months ago. He had a extension with her current symptoms last month. The patient was placed on dual antiplatelet agent. On yesterday patient reported waking up with severe right-sided weakness and he thought he had a stroke. The entire right side was involved and was also associated with numbness and tingling. He does have issues with dysarthria and dysphagia but these were not worse. He does not report having dizziness. He was evaluated in the emergency room and was found to have marked hypoglycemia 34. He was suddenly given dextrose. His right-sided weakness was not better however and subsequently admitted to the hospital for evaluation. Today he reports a dramatic improvement in his symptoms. He thinks that his right-sided weakness is at baseline. The notes and tingling has also improved. The patient does not report other symptoms such as headaches, dysarthria, dysphagia over baseline, chest pain or shortness of breath. There are no GI GU symptoms. The review of systems otherwise negative.  GENERAL: Pleasant in no acute distress.  HEENT: Supple. Atraumatic normocephalic.   ABDOMEN: soft  EXTREMITIES: No edema     BACK: Normal.  SKIN: Normal by inspection.    MENTAL STATUS: Alert and oriented. The patient has a moderate dysarthria which seems to be at his baseline. He is lucid and coherent.  CRANIAL NERVES: Pupils are equal, round and reactive to light and accommodation; extra ocular movements are full, there is no significant nystagmus; visual fields are full; there is marked weakness of the left facial muscles both upper and lower; tongue is midline; uvula is midline; shoulder elevation is normal.  MOTOR: There is slightly increased tone of the right leg. Strength is normal both proximally/hip flexion and dorsiflexion. The right upper extremity also shows normal bulk and strength. The left side shows normal tone, bulk and strength.  COORDINATION: Left finger to nose is normal, right finger to nose is normal, No rest tremor; no intention tremor; no postural tremor; no bradykinesia.  REFLEXES: Deep tendon reflexes are symmetrical and normal. Babinski reflexes are flexor bilaterally.   SENSATION: Normal to light touch.        [[[[[[[[[[[[[[[[[[[[[[[[[PRIOR NOTE DEC 2016 Acute left pontine infarct - extension of previous left pontine infarct. Deficits include severe dysarthria, dysphasia, left Horner's syndrome, right hemiparesis/hemiplegia, left lower facial weakness and a left sixth nerve palsy. This is likely due to thrombosis of a perforating vessel off the basilar artery. Risk factors hypertension, age, dyslipidemia, and coronary artery disease.  Unexplained dysphagia over the last several months with difficulty swallowing liquids.  Cough and swallowing due to induced syncope.  Gait impairment.  Vitamin B12 deficiency.  Pseudobulbar affect.  Likely obstructive sleep apnea syndrome.]]]]]]]]]]]]]]]]]]]]]]]]]]]]]]]]]      Blood pressure 124/74, pulse 65, temperature 97.6 F (36.4 C),  temperature source Oral, resp. rate 20, height 5' 10.5" (1.791 m), weight 108.863 kg (240 lb),  SpO2 97 %.  Past Medical History  Diagnosis Date  . GERD (gastroesophageal reflux disease)   . CAD (coronary artery disease)   . Obesity   . Hyperlipidemia   . Hypertension   . Chronic back pain   . Gout   . Chronic bronchitis ([place])     "get it q yr"  . Psoriasis   . COPD (chronic obstructive pulmonary disease) ([place])   . Myocardial infarction ([place]) 1999  . Pneumonia     "a few times; last time was ~ 06/2013"  . On home oxygen therapy     "2L; only at night" (08/30/2013)  . Diabetes mellitus, type 1   . Arthritis of knee   . Arthritis     "left leg" (08/30/2013)  . Vitamin B12 deficiency 05/01/2015  . Abnormality of gait 05/01/2015  . Tussive syncope 05/01/2015  . Stroke ([place])   . CVA (cerebral infarction)     Past Surgical History  Procedure Laterality Date  . Appendectomy    . Colonoscopy  03/17/2012    Procedure: COLONOSCOPY;  Surgeon: Rogene Houston, MD;  Location: [facility] ENDO SUITE;  Service: Endoscopy;  Laterality: N/A;  830  . Cystoscopy with urethral dilatation N/A 11/26/2012    Procedure: CYSTOSCOPY WITH URETHRAL DILATATION;  Surgeon: Malka So, MD;  Location: [facility] ORS;  Service: Urology;  Laterality: N/A;  . Coronary angioplasty with stent placement  1999- 2009-08/30/2013    "counting today's, I have 5 stents" (08/30/2013)  . Left heart catheterization with coronary angiogram N/A 08/30/2013    Procedure: LEFT HEART CATHETERIZATION WITH CORONARY ANGIOGRAM;  Surgeon: Clent Demark, MD;  Location: [facility] CATH LAB;  Service: Cardiovascular;  Laterality: N/A;  . Percutaneous coronary stent intervention (pci-s) Right 08/30/2013    Procedure: PERCUTANEOUS CORONARY STENT INTERVENTION (PCI-S);  Surgeon: Clent Demark, MD;  Location: [facility] CATH LAB;  Service: Cardiovascular;  Laterality: Right;    Family History  Problem Relation Age of Onset  . Hypertension Mother   . Diabetes Mother   . Hypertension Father   . Diabetes Brother   . Arthritis      Family History   . Diabetes       family History   . Stroke Daughter     Social History:  reports that he has quit smoking. His smoking use included Cigarettes. He has a 16.5 pack-year smoking history. He has never used smokeless tobacco. He reports that he does not drink alcohol or use illicit drugs.  Allergies: No Known Allergies  Medications: Prior to Admission medications   Medication Sig Start Date End Date Taking? Authorizing Provider  acetaminophen (TYLENOL) 500 MG tablet Take 500 mg by mouth every 6 (six) hours as needed for moderate pain (left thigh).   Yes Historical Provider, MD  albuterol (PROVENTIL HFA;VENTOLIN HFA) 108 (90 Base) MCG/ACT inhaler Inhale 2 puffs into the lungs every 4 (four) hours as needed for wheezing or shortness of breath. 06/22/15  Yes Kristen N Ward, DO  allopurinol (ZYLOPRIM) 300 MG tablet Take 300 mg by mouth daily. 05/02/13  Yes Historical Provider, MD  amLODipine (NORVASC) 10 MG tablet Take 1 tablet (10 mg total) by mouth daily. 09/20/14  Yes Fayrene Helper, MD  aspirin 325 MG tablet Take 1 tablet (325 mg total) by mouth daily. 05/12/15  Yes Kathie Dike, MD  clopidogrel (PLAVIX) 75 MG tablet Take 1 tablet (75  mg total) by mouth daily. 05/18/15  Yes Samuella Cota, MD  insulin NPH-regular Human (NOVOLIN 70/30) (70-30) 100 UNIT/ML injection Inject 50 Units into the skin 2 (two) times daily with a meal.   Yes Historical Provider, MD  levETIRAcetam (KEPPRA) 500 MG tablet Take 1 tablet (500 mg total) by mouth 2 (two) times daily. 05/12/15  Yes Kathie Dike, MD  levothyroxine (SYNTHROID, LEVOTHROID) 75 MCG tablet Take 75 mcg by mouth every morning.    Yes Historical Provider, MD  lisinopril (PRINIVIL,ZESTRIL) 40 MG tablet Take 40 mg by mouth daily.   Yes Historical Provider, MD  metFORMIN (GLUCOPHAGE) 500 MG tablet Take 1 tablet (500 mg total) by mouth 2 (two) times daily. 09/02/13  Yes Charolette Forward, MD  metoprolol (LOPRESSOR) 50 MG tablet Take 0.5 tablets (25 mg total) by mouth 2  (two) times daily. Patient taking differently: Take 50 mg by mouth 2 (two) times daily.  05/18/15  Yes Samuella Cota, MD  pantoprazole (PROTONIX) 40 MG tablet Take 1 tablet (40 mg total) by mouth daily before breakfast. 05/12/15  Yes Kathie Dike, MD  pravastatin (PRAVACHOL) 40 MG tablet Take 2 tablets (80 mg total) by mouth daily. 05/18/15  Yes Samuella Cota, MD  senna (SENOKOT) 8.6 MG tablet Take 1 tablet by mouth daily.   Yes Historical Provider, MD  spironolactone (ALDACTONE) 25 MG tablet Take 25 mg by mouth daily.   Yes Historical Provider, MD  vitamin B-12 (CYANOCOBALAMIN) 1000 MCG tablet Take 1,000 mcg by mouth daily.   Yes Historical Provider, MD  Maltodextrin-Xanthan Gum ([employer]) POWD As needed Patient not taking: Reported on 06/25/2015 05/18/15   Samuella Cota, MD    Scheduled Meds: .  stroke: mapping our early stages of recovery book   Does not apply Once  .  stroke: mapping our early stages of recovery book   Does not apply Once  . allopurinol  300 mg Oral Daily  . amLODipine  10 mg Oral Daily  . aspirin  325 mg Oral Daily  . Chlorhexidine Gluconate Cloth  6 each Topical Q0600  . clopidogrel  75 mg Oral Daily  . heparin  5,000 Units Subcutaneous 3 times per day  . levETIRAcetam  500 mg Oral BID  . levothyroxine  75 mcg Oral QAC breakfast  . metFORMIN  500 mg Oral BID WC  . metoprolol  50 mg Oral BID  . mupirocin ointment  1 application Nasal BID  . pantoprazole  40 mg Oral QAC breakfast  . pravastatin  80 mg Oral Daily  . predniSONE  60 mg Oral Q breakfast  . sodium chloride flush  3 mL Intravenous Q12H   Continuous Infusions:  PRN Meds:.acetaminophen **OR** acetaminophen, albuterol, senna-docusate     Results for orders placed or performed during the hospital encounter of 06/24/15 (from the past 48 hour(s))  Ethanol     Status: None   Collection Time: 06/24/15  5:54 AM  Result Value Ref Range   Alcohol, Ethyl (B) <5 <5 mg/dL     Comment:        LOWEST DETECTABLE LIMIT FOR SERUM ALCOHOL IS 5 mg/dL FOR MEDICAL PURPOSES ONLY   Protime-INR     Status: None   Collection Time: 06/24/15  5:54 AM  Result Value Ref Range   Prothrombin Time 12.4 11.6 - 15.2 seconds   INR 0.90 0.00 - 1.49  APTT     Status: None   Collection Time: 06/24/15  5:54 AM  Result Value Ref Range   aPTT 28 24 - 37 seconds  CBC     Status: Abnormal   Collection Time: 06/24/15  5:54 AM  Result Value Ref Range   WBC 14.2 (H) 4.0 - 10.5 K/uL   RBC 3.71 (L) 4.22 - 5.81 MIL/uL   Hemoglobin 9.9 (L) 13.0 - 17.0 g/dL   HCT 32.5 (L) 39.0 - 52.0 %   MCV 87.6 78.0 - 100.0 fL   MCH 26.7 26.0 - 34.0 pg   MCHC 30.5 30.0 - 36.0 g/dL   RDW 15.7 (H) 11.5 - 15.5 %   Platelets 275 150 - 400 K/uL  Differential     Status: Abnormal   Collection Time: 06/24/15  5:54 AM  Result Value Ref Range   Neutrophils Relative % 64 %   Neutro Abs 9.1 (H) 1.7 - 7.7 K/uL   Lymphocytes Relative 25 %   Lymphs Abs 3.6 0.7 - 4.0 K/uL   Monocytes Relative 8 %   Monocytes Absolute 1.2 (H) 0.1 - 1.0 K/uL   Eosinophils Relative 2 %   Eosinophils Absolute 0.2 0.0 - 0.7 K/uL   Basophils Relative 1 %   Basophils Absolute 0.1 0.0 - 0.1 K/uL  Comprehensive metabolic panel     Status: Abnormal   Collection Time: 06/24/15  5:54 AM  Result Value Ref Range   Sodium 140 135 - 145 mmol/L   Potassium 4.6 3.5 - 5.1 mmol/L   Chloride 104 101 - 111 mmol/L   CO2 28 22 - 32 mmol/L   Glucose, Bld 48 (L) 65 - 99 mg/dL   BUN 29 (H) 6 - 20 mg/dL   Creatinine, Ser 1.04 0.61 - 1.24 mg/dL   Calcium 9.8 8.9 - 10.3 mg/dL   Total Protein 7.0 6.5 - 8.1 g/dL   Albumin 3.4 (L) 3.5 - 5.0 g/dL   AST 15 15 - 41 U/L   ALT 9 (L) 17 - 63 U/L   Alkaline Phosphatase 69 38 - 126 U/L   Total Bilirubin 0.2 (L) 0.3 - 1.2 mg/dL   GFR calc non Af Amer >60 >60 mL/min   GFR calc Af Amer >60 >60 mL/min    Comment: (NOTE) The eGFR has been calculated using the CKD EPI equation. This calculation has not been  validated in all clinical situations. eGFR's persistently <60 mL/min signify possible Chronic Kidney Disease.    Anion gap 8 5 - 15  Troponin I     Status: None   Collection Time: 06/24/15  5:54 AM  Result Value Ref Range   Troponin I <0.03 <0.031 ng/mL    Comment:        NO INDICATION OF MYOCARDIAL INJURY.   CBG monitoring, ED     Status: Abnormal   Collection Time: 06/24/15  7:12 AM  Result Value Ref Range   Glucose-Capillary 34 (LL) 65 - 99 mg/dL   Comment 1 Notify RN   Urine rapid drug screen (hosp performed)not at [facility]     Status: None   Collection Time: 06/24/15  8:11 AM  Result Value Ref Range   Opiates NONE DETECTED NONE DETECTED   Cocaine NONE DETECTED NONE DETECTED   Benzodiazepines NONE DETECTED NONE DETECTED   Amphetamines NONE DETECTED NONE DETECTED   Tetrahydrocannabinol NONE DETECTED NONE DETECTED   Barbiturates NONE DETECTED NONE DETECTED    Comment:        DRUG SCREEN FOR MEDICAL PURPOSES ONLY.  IF CONFIRMATION IS NEEDED FOR ANY PURPOSE, NOTIFY LAB WITHIN  5 DAYS.        LOWEST DETECTABLE LIMITS FOR URINE DRUG SCREEN Drug Class       Cutoff (ng/mL) Amphetamine      1000 Barbiturate      200 Benzodiazepine   109 Tricyclics       604 Opiates          300 Cocaine          300 THC              50   Urinalysis, Routine w reflex microscopic (not at [facility])     Status: None   Collection Time: 06/24/15  8:11 AM  Result Value Ref Range   Color, Urine YELLOW YELLOW   APPearance CLEAR CLEAR   Specific Gravity, Urine 1.010 1.005 - 1.030   pH 5.5 5.0 - 8.0   Glucose, UA NEGATIVE NEGATIVE mg/dL   Hgb urine dipstick NEGATIVE NEGATIVE   Bilirubin Urine NEGATIVE NEGATIVE   Ketones, ur NEGATIVE NEGATIVE mg/dL   Protein, ur NEGATIVE NEGATIVE mg/dL   Nitrite NEGATIVE NEGATIVE   Leukocytes, UA NEGATIVE NEGATIVE    Comment: MICROSCOPIC NOT DONE ON URINES WITH NEGATIVE PROTEIN, BLOOD, LEUKOCYTES, NITRITE, OR GLUCOSE <1000 mg/dL.  CBG monitoring, ED     Status: None    Collection Time: 06/24/15  8:45 AM  Result Value Ref Range   Glucose-Capillary 74 65 - 99 mg/dL  MRSA PCR Screening     Status: Abnormal   Collection Time: 06/24/15  8:10 PM  Result Value Ref Range   MRSA by PCR POSITIVE (A) NEGATIVE    Comment:        The GeneXpert MRSA Assay (FDA approved for NASAL specimens only), is one component of a comprehensive MRSA colonization surveillance program. It is not intended to diagnose MRSA infection nor to guide or monitor treatment for MRSA infections. RESULT CALLED TO, READ BACK BY AND VERIFIED WITH: BULOCK, K. AT 2312 ON 06/24/2015 BY AGUNDIZ,E.    Glucose, capillary     Status: Abnormal   Collection Time: 06/24/15  9:40 PM  Result Value Ref Range   Glucose-Capillary 197 (H) 65 - 99 mg/dL  Lipid panel     Status: Abnormal   Collection Time: 06/25/15  5:33 AM  Result Value Ref Range   Cholesterol 133 0 - 200 mg/dL   Triglycerides 175 (H) <150 mg/dL   HDL 26 (L) >40 mg/dL   Total CHOL/HDL Ratio 5.1 RATIO   VLDL 35 0 - 40 mg/dL   LDL Cholesterol 72 0 - 99 mg/dL    Comment:        Total Cholesterol/HDL:CHD Risk Coronary Heart Disease Risk Table                     Men   Women  1/2 Average Risk   3.4   3.3  Average Risk       5.0   4.4  2 X Average Risk   9.6   7.1  3 X Average Risk  23.4   11.0        Use the calculated Patient Ratio above and the CHD Risk Table to determine the patient's CHD Risk.        ATP III CLASSIFICATION (LDL):  <100     mg/dL   Optimal  100-129  mg/dL   Near or Above                    Optimal  130-159  mg/dL   Borderline  160-189  mg/dL   High  >190     mg/dL   Very High   Comprehensive metabolic panel     Status: Abnormal   Collection Time: 06/25/15  5:33 AM  Result Value Ref Range   Sodium 137 135 - 145 mmol/L   Potassium 5.0 3.5 - 5.1 mmol/L   Chloride 101 101 - 111 mmol/L   CO2 27 22 - 32 mmol/L   Glucose, Bld 150 (H) 65 - 99 mg/dL   BUN 31 (H) 6 - 20 mg/dL   Creatinine, Ser 1.10 0.61  - 1.24 mg/dL   Calcium 9.1 8.9 - 10.3 mg/dL   Total Protein 6.7 6.5 - 8.1 g/dL   Albumin 3.2 (L) 3.5 - 5.0 g/dL   AST 14 (L) 15 - 41 U/L   ALT 11 (L) 17 - 63 U/L   Alkaline Phosphatase 60 38 - 126 U/L   Total Bilirubin 0.4 0.3 - 1.2 mg/dL   GFR calc non Af Amer >60 >60 mL/min   GFR calc Af Amer >60 >60 mL/min    Comment: (NOTE) The eGFR has been calculated using the CKD EPI equation. This calculation has not been validated in all clinical situations. eGFR's persistently <60 mL/min signify possible Chronic Kidney Disease.    Anion gap 9 5 - 15  CBC     Status: Abnormal   Collection Time: 06/25/15  5:33 AM  Result Value Ref Range   WBC 12.8 (H) 4.0 - 10.5 K/uL   RBC 3.68 (L) 4.22 - 5.81 MIL/uL   Hemoglobin 10.0 (L) 13.0 - 17.0 g/dL   HCT 31.6 (L) 39.0 - 52.0 %   MCV 85.9 78.0 - 100.0 fL   MCH 27.2 26.0 - 34.0 pg   MCHC 31.6 30.0 - 36.0 g/dL   RDW 15.7 (H) 11.5 - 15.5 %   Platelets 279 150 - 400 K/uL    Studies/Results:  BRAIN MRI/MRA: MRA HEAD FINDINGS   Exam is motion degraded.   Narrowing distal cervical segment left internal carotid artery. Ectatic right internal carotid artery cervical/petrous junction.   Ectatic internal carotid artery pre cavernous and cavernous segment. Loss of signal anterior aspect of the cavernous segment greater on the left may be related to combination of narrowing and/or artifact. True stenosis on the left suspected.   Ectatic carotid terminus   Narrowing left middle cerebral artery distal M1 segment/left middle cerebral artery bifurcation extending to involve proximal left middle cerebral artery branches.   Mild narrowing right middle cerebral artery M1 segment/bifurcation.   Ectatic anterior cerebral artery with regions of mild narrowing bilaterally.   Moderate narrowing distal right vertebral artery. Mild to moderate narrowing distal left vertebral artery.   Poor delineation of the posterior inferior cerebellar arteries  with only proximal aspect visualized.   Moderate narrowing proximal basilar artery.   Nonvisualized right anterior inferior cerebellar artery with only the proximal aspect of the left anterior inferior cerebellar artery visualized.   Moderate to marked stenosis of portions of the posterior cerebral artery more notable on the left.   IMPRESSION: MRI HEAD   Exam is motion degraded.   No acute infarct. T2 shine through left paracentral pons where acute infarct was noted in December 2016.   Remote bilateral thalamic infarcts. Remote posterior left lenticular nucleus infarct. Mild chronic small vessel disease changes.   Mild global atrophy without hydrocephalus.   MRA HEAD   Prominent intracranial atherosclerotic type changes as detailed above.  Darrielle Pflieger A. Merlene Laughter, M.D.  Diplomate, Tax adviser of Psychiatry and Neurology ( Neurology). 06/25/2015, 5:44 PM

## 2015-06-25 NOTE — Progress Notes (Signed)
Speech Language Pathology Treatment: Dysphagia  Patient Details Name: [name] MRN: [id number] DOB: 03-21-1941 Today's Date: 06/25/2015 Time: [id number] SLP Time Calculation (min) (ACUTE ONLY): 15 min  Assessment / Plan / Recommendation Clinical Impression  Dysphagia therapy provided to assess continued tolerance of current diet. MRI reveals no acute infarct. Pt directly observed with thin liquids consuming with no overt s/sx of aspiration; pt's swallow function continues to present at prior level. Pt still requires occasional cues for some impulsivity and to utilize recommended strategies which his wife provides. There are no further ST needs noted at this time. ST to sign off.    HPI HPI: [name] is a 75 y.o. male with hx of L sided CVA with extension in 12/16, CAD, copd, DM who presented to the emergency department complaints of new right-sided numbness and weakness. The patient presented to the emergency department where Code stroke was activated. CT head was unremarkable. Case was discussed with telemetry neurologist, who recommends repeat MRI and continued observation. At present, the patient continues complaint of right-sided numbness and reports difficulty raising his right leg off the bed, which is a new finding per the patient and his family.          Recommendations  Diet recommendations: Dysphagia 3 (mechanical soft);Thin liquid Liquids provided via: Cup Medication Administration: Crushed with puree Supervision: Patient able to self feed Compensations: Slow rate;Minimize environmental distractions;Small sips/bites;Lingual sweep for clearance of pocketing;Monitor for anterior loss;Clear throat intermittently;Effortful swallow Postural Changes and/or Swallow Maneuvers: Seated upright 90 degrees;Upright 30-60 min after meal            Oral Care Recommendations: Oral care BID Follow up Recommendations: None     Amelia H. Roddie Mc, CCC-SLP Speech Language  Pathologist       Wende Bushy 06/25/2015, 2:25 PM

## 2015-06-25 NOTE — Progress Notes (Signed)
[facility] PROGRESS NOTE  [name] [id number] DOB: [birth year] DOA: 06/24/2015 PCP: Tula Nakayama, MD  HPI/Brief narrative 75 y.o. male with hx of L sided CVA with extension in 12/16, CAD, copd, DM who presented to the emergency department complaints of new right-sided numbness and weakness. The patient presented to the emergency department where Code stroke was activated. CT head was unremarkable. Case was discussed with telemetry neurologist, who recommends repeat MRI and continued observation.  Assessment/Plan: 1. Stroke 1. Prior L sided CVA in 12/16 with re-admission with extension of stroke 2. Symptoms started around 6am on day of admit with R sided numbness and weakness, still with residual R LE weakness (difficulty lifting off bed) 3. Tele-neuro was called in ED after code stroke called, recommended observation 4. Also discussed case with [facility] Neurologist 5. Repeat MRI brain without acute CVA 6. 2d echo and carotids recently done thus will not re-order 7. PT/OT/SLP 8. Cont ASA and plavix for now 9. This AM, pt continues to report RLE parasthesias 2. DM2 1. Cont SSI coverage 2. Cont metformin 3. HTN 1. BP stable and controlled at this time 4. CAD 1. Stable 2. No chest pain 3. Cont ASA and plavix 5. COPD 1. Stable 2. No wheezing on exam 6. DVT prophylaxis  Code Status: Full Family Communication: Pt in room Disposition Plan: Possible d/c in 24 if cleared by Neurology   Consultants:  Neurology  Procedures:    Antibiotics: Anti-infectives    None      HPI/Subjective: Complaining of continued paraesthesias of the RLE  Objective: Filed Vitals:   06/25/15 0530 06/25/15 0759 06/25/15 0930 06/25/15 1000  BP: 149/75  115/71 126/67  Pulse: 63  65 66  Temp: 97.8 F (36.6 C)  97.6 F (36.4 C) 97.5 F (36.4 C)  TempSrc: Oral  Oral Oral  Resp: 20  20 20   Height:      Weight:      SpO2: 93% 96% 97% 97%    Intake/Output Summary (Last 24  hours) at 06/25/15 1452 Last data filed at 06/25/15 0800  Gross per 24 hour  Intake    240 ml  Output    700 ml  Net   -460 ml   Filed Weights   06/24/15 0553  Weight: 108.863 kg (240 lb)    Exam:   General:  Awake, in nad  Cardiovascular: regular, s1, s2  Respiratory: normal resp effort, no wheezing  Abdomen: soft,nondistended  Musculoskeletal: perfused, no clubbing   Data Reviewed: Basic Metabolic Panel:  Recent Labs Lab 06/22/15 0035 06/24/15 0554 06/25/15 0533  NA 141 140 137  K 4.9 4.6 5.0  CL 105 104 101  CO2 28 28 27   GLUCOSE 184* 48* 150*  BUN 23* 29* 31*  CREATININE 1.36* 1.04 1.10  CALCIUM 9.7 9.8 9.1   Liver Function Tests:  Recent Labs Lab 06/24/15 0554 06/25/15 0533  AST 15 14*  ALT 9* 11*  ALKPHOS 69 60  BILITOT 0.2* 0.4  PROT 7.0 6.7  ALBUMIN 3.4* 3.2*   No results for input(s): LIPASE, AMYLASE in the last 168 hours. No results for input(s): AMMONIA in the last 168 hours. CBC:  Recent Labs Lab 06/22/15 0035 06/24/15 0554 06/25/15 0533  WBC 12.3* 14.2* 12.8*  NEUTROABS 8.5* 9.1*  --   HGB 9.2* 9.9* 10.0*  HCT 29.7* 32.5* 31.6*  MCV 88.1 87.6 85.9  PLT 242 275 279   Cardiac Enzymes:  Recent Labs Lab 06/22/15 0035 06/24/15 0554  TROPONINI <  0.03 <0.03   BNP (last 3 results)  Recent Labs  04/18/15 2200 06/22/15 0035  BNP 22.0 29.0    ProBNP (last 3 results) No results for input(s): PROBNP in the last 8760 hours.  CBG:  Recent Labs Lab 06/24/15 0712 06/24/15 0845 06/24/15 2140  GLUCAP 34* 74 197*    Recent Results (from the past 240 hour(s))  MRSA PCR Screening     Status: Abnormal   Collection Time: 06/24/15  8:10 PM  Result Value Ref Range Status   MRSA by PCR POSITIVE (A) NEGATIVE Final    Comment:        The GeneXpert MRSA Assay (FDA approved for NASAL specimens only), is one component of a comprehensive MRSA colonization surveillance program. It is not intended to diagnose MRSA infection nor  to guide or monitor treatment for MRSA infections. RESULT CALLED TO, READ BACK BY AND VERIFIED WITH: BULOCK, K. AT 2312 ON 06/24/2015 BY AGUNDIZ,E.       Studies: Ct Head Wo Contrast  06/24/2015  CLINICAL DATA:  Right-sided weakness beginning 30 minutes prior to presentation. EXAM: CT HEAD WITHOUT CONTRAST TECHNIQUE: Contiguous axial images were obtained from the base of the skull through the vertex without intravenous contrast. COMPARISON:  05/15/2015 FINDINGS: Skull and Sinuses:Negative for fracture or destructive process. Visualized orbits: Negative. Brain: No acute intracranial hemorrhage or visible acute infarct. There are known small vessel infarcts in the left pons, bilateral thalamus, and possibly in the posterior limb left internal capsule (versus dilated perivascular space). ASPECTS is 10. There is small-vessel ischemic gliosis in the deep cerebral white matter, worse in the parietal region. No hydrocephalus. Extensive atherosclerotic calcification. No hydrocephalus. Critical Value/emergent results were called by telephone at the time of interpretation on 06/24/2015 at 6:25 am to Dr. Joseph Berkshire , who verbally acknowledged these results. IMPRESSION: 1. No acute hemorrhage or visible acute infarct. 2. Chronic small vessel disease with remote pontine and bilateral thalamic infarcts. Electronically Signed   By: Monte Fantasia M.D.   On: 06/24/2015 06:27   Mr [name] Head Wo Contrast  06/25/2015  CLINICAL DATA:  75 year old hypertensive male with right-sided weakness for 2 days. History of prior stroke. Subsequent encounter. EXAM: MRI HEAD WITHOUT CONTRAST MRA HEAD WITHOUT CONTRAST TECHNIQUE: Multiplanar, multiecho pulse sequences of the brain and surrounding structures were obtained without intravenous contrast. Angiographic images of the head were obtained using MRA technique without contrast. COMPARISON:  06/24/2015 head CT.  05/15/2015 brain MR. FINDINGS: MRI HEAD FINDINGS Exam is motion  degraded. No acute infarct. T2 shine through left paracentral pons where acute infarct was noted in December 2016. Remote bilateral thalamic infarcts. Remote posterior left lenticular nucleus infarct. Mild chronic small vessel disease changes. Mild global atrophy without hydrocephalus. No intracranial mass lesion noted on this unenhanced exam. Mild exophthalmos. Cervical medullary junction unremarkable. MRA HEAD FINDINGS Exam is motion degraded. Narrowing distal cervical segment left internal carotid artery. Ectatic right internal carotid artery cervical/petrous junction. Ectatic internal carotid artery pre cavernous and cavernous segment. Loss of signal anterior aspect of the cavernous segment greater on the left may be related to combination of narrowing and/or artifact. True stenosis on the left suspected. Ectatic carotid terminus Narrowing left middle cerebral artery distal M1 segment/left middle cerebral artery bifurcation extending to involve proximal left middle cerebral artery branches. Mild narrowing right middle cerebral artery M1 segment/bifurcation. Ectatic anterior cerebral artery with regions of mild narrowing bilaterally. Moderate narrowing distal right vertebral artery. Mild to moderate narrowing distal left vertebral artery. Poor  delineation of the posterior inferior cerebellar arteries with only proximal aspect visualized. Moderate narrowing proximal basilar artery. Nonvisualized right anterior inferior cerebellar artery with only the proximal aspect of the left anterior inferior cerebellar artery visualized. Moderate to marked stenosis of portions of the posterior cerebral artery more notable on the left. IMPRESSION: MRI HEAD Exam is motion degraded. No acute infarct. T2 shine through left paracentral pons where acute infarct was noted in December 2016. Remote bilateral thalamic infarcts. Remote posterior left lenticular nucleus infarct. Mild chronic small vessel disease changes. Mild global  atrophy without hydrocephalus. MRA HEAD Prominent intracranial atherosclerotic type changes as detailed above. Electronically Signed   By: Genia Del M.D.   On: 06/25/2015 08:36   Mr Brain Wo Contrast  06/25/2015  CLINICAL DATA:  75 year old hypertensive male with right-sided weakness for 2 days. History of prior stroke. Subsequent encounter. EXAM: MRI HEAD WITHOUT CONTRAST MRA HEAD WITHOUT CONTRAST TECHNIQUE: Multiplanar, multiecho pulse sequences of the brain and surrounding structures were obtained without intravenous contrast. Angiographic images of the head were obtained using MRA technique without contrast. COMPARISON:  06/24/2015 head CT.  05/15/2015 brain MR. FINDINGS: MRI HEAD FINDINGS Exam is motion degraded. No acute infarct. T2 shine through left paracentral pons where acute infarct was noted in December 2016. Remote bilateral thalamic infarcts. Remote posterior left lenticular nucleus infarct. Mild chronic small vessel disease changes. Mild global atrophy without hydrocephalus. No intracranial mass lesion noted on this unenhanced exam. Mild exophthalmos. Cervical medullary junction unremarkable. MRA HEAD FINDINGS Exam is motion degraded. Narrowing distal cervical segment left internal carotid artery. Ectatic right internal carotid artery cervical/petrous junction. Ectatic internal carotid artery pre cavernous and cavernous segment. Loss of signal anterior aspect of the cavernous segment greater on the left may be related to combination of narrowing and/or artifact. True stenosis on the left suspected. Ectatic carotid terminus Narrowing left middle cerebral artery distal M1 segment/left middle cerebral artery bifurcation extending to involve proximal left middle cerebral artery branches. Mild narrowing right middle cerebral artery M1 segment/bifurcation. Ectatic anterior cerebral artery with regions of mild narrowing bilaterally. Moderate narrowing distal right vertebral artery. Mild to moderate  narrowing distal left vertebral artery. Poor delineation of the posterior inferior cerebellar arteries with only proximal aspect visualized. Moderate narrowing proximal basilar artery. Nonvisualized right anterior inferior cerebellar artery with only the proximal aspect of the left anterior inferior cerebellar artery visualized. Moderate to marked stenosis of portions of the posterior cerebral artery more notable on the left. IMPRESSION: MRI HEAD Exam is motion degraded. No acute infarct. T2 shine through left paracentral pons where acute infarct was noted in December 2016. Remote bilateral thalamic infarcts. Remote posterior left lenticular nucleus infarct. Mild chronic small vessel disease changes. Mild global atrophy without hydrocephalus. MRA HEAD Prominent intracranial atherosclerotic type changes as detailed above. Electronically Signed   By: Genia Del M.D.   On: 06/25/2015 08:36    Scheduled Meds: .  stroke: mapping our early stages of recovery book   Does not apply Once  .  stroke: mapping our early stages of recovery book   Does not apply Once  . allopurinol  300 mg Oral Daily  . amLODipine  10 mg Oral Daily  . aspirin  325 mg Oral Daily  . Chlorhexidine Gluconate Cloth  6 each Topical Q0600  . clopidogrel  75 mg Oral Daily  . heparin  5,000 Units Subcutaneous 3 times per day  . levETIRAcetam  500 mg Oral BID  . levothyroxine  75 mcg Oral  QAC breakfast  . metFORMIN  500 mg Oral BID WC  . metoprolol  50 mg Oral BID  . mupirocin ointment  1 application Nasal BID  . pantoprazole  40 mg Oral QAC breakfast  . pravastatin  80 mg Oral Daily  . predniSONE  60 mg Oral Q breakfast  . sodium chloride flush  3 mL Intravenous Q12H   Continuous Infusions:   Principal Problem:   Stroke [facility]) Active Problems:   Diabetes mellitus, insulin dependent (IDDM), uncontrolled (HCC)   Essential hypertension   Coronary atherosclerosis   COPD (chronic obstructive pulmonary disease) (HCC)   Type 2  diabetes mellitus with diabetic nephropathy (HCC)   CVA (cerebral infarction)   Dysarthria   Right sided weakness   CKD (chronic kidney disease) stage 2, GFR 60-89 ml/min   Abdikadir Fohl K  [facility] Pager [phone]. If 7PM-7AM, please contact night-coverage at [email or web address], password [facility] 06/25/2015, 2:52 PM  LOS: 1 day

## 2015-06-25 NOTE — Progress Notes (Signed)
Inpatient Diabetes Program Recommendations  AACE/ADA: New Consensus Statement on Inpatient Glycemic Control (2015)  Target Ranges:  Prepandial:   less than 140 mg/dL      Peak postprandial:   less than 180 mg/dL (1-2 hours)      Critically ill patients:  140 - 180 mg/dL  Results for AVERY, MILLSTEIN (MRN [id number]) as of 06/25/2015 09:09  Ref. Range 06/25/2015 05:33  Glucose Latest Ref Range: 65-99 mg/dL 150 (H)   Results for DOSS, MCCORMACK (MRN [id number]) as of 06/25/2015 09:09  Ref. Range 06/24/2015 07:12 06/24/2015 08:45 06/24/2015 21:40  Glucose-Capillary Latest Ref Range: 65-99 mg/dL 34 (LL) 74 197 (H)   Review of Glycemic Control  Diabetes history: DM2 Outpatient Diabetes medications: Metformin 500 mg BID Current orders for Inpatient glycemic control: Metformin 500 mg BID  Inpatient Diabetes Program Recommendations: Correction (SSI): While inpatient, please consider ordering CBGs with Novolog sensitive correction scale.  Thanks, Barnie Alderman, RN, MSN, CDE Diabetes Coordinator Inpatient Diabetes Program [phone] (Team Pager from [place] to [place]) [phone] ([place] office) [phone] [facility] office) [phone] [facility] office)

## 2015-06-26 LAB — HEMOGLOBIN A1C
HEMOGLOBIN A1C: 7.2 % — AB (ref 4.8–5.6)
Mean Plasma Glucose: 160 mg/dL

## 2015-06-26 NOTE — Progress Notes (Signed)
Patient and wife state understanding of discharge instructions 

## 2015-06-26 NOTE — Discharge Summary (Signed)
Physician Discharge Summary  [name] [id number] DOB: 08/11/1940 DOA: 06/24/2015  PCP: Tula Nakayama, MD  Admit date: 06/24/2015 Discharge date: 06/26/2015  Time spent: 20 minutes  Recommendations for Outpatient Follow-up:  1. Follow up with PCP in 1-2 weeks 2. Follow up with Neurology as scheduled   Discharge Diagnoses:  Principal Problem:   Stroke [facility]) Active Problems:   Diabetes mellitus, insulin dependent (IDDM), uncontrolled ([place])   Essential hypertension   Coronary atherosclerosis   COPD (chronic obstructive pulmonary disease) (HCC)   Type 2 diabetes mellitus with diabetic nephropathy (HCC)   CVA (cerebral infarction)   Dysarthria   Right sided weakness   CKD (chronic kidney disease) stage 2, GFR 60-89 ml/min   Discharge Condition: Stable  Diet recommendation: Dysphagia 3  Filed Weights   06/24/15 0553  Weight: 108.863 kg (240 lb)    History of present illness:  Please review dictated H and P from 2/5 for details. Briefly, 75 y.o. male with hx of L sided CVA with extension in 12/16, CAD, copd, DM who presented to the emergency department complaints of new right-sided numbness and weakness. The patient presented to the emergency department where Code stroke was activated. CT head was unremarkable. Case was discussed with telemetry neurologist, who recommends repeat MRI and continued observation.  Hospital Course:  1. Stroke 1. Prior L sided CVA in 12/16 with re-admission with extension of stroke 2. Symptoms started around 6am on day of admit with R sided numbness and weakness, still with residual R LE weakness (difficulty lifting off bed) on admit 3. Tele-neuro was called in ED after code stroke called, recommended observation 4. Also discussed case with [facility] Neurologist 5. Repeat MRI brain without acute CVA 6. 2d echo and carotids recently done thus will not re-order 7. PT/OT/SLP done this admit 8. Cont ASA and plavix for now 9. Per Neurology, recs to  continue dual antiplatelets x 3 month, then change to single regimen afterwards 2. DM2 1. Cont SSI coverage 2. Cont metformin 3. HTN 1. BP remained stable and controlled at this time 4. CAD 1. Remained Stable 2. No chest pain 3. Cont ASA and plavix 5. COPD 1. Remained stable 2. No wheezing on exam 6. DVT prophylaxis 1. Heparin subQ while admitted  Consultations:  Neurology  Discharge Exam: Filed Vitals:   06/26/15 0130 06/26/15 0530 06/26/15 0749 06/26/15 0930  BP: 125/63 120/62  126/63  Pulse: 60 62  64  Temp: 97.7 F (36.5 C) 97.8 F (36.6 C)  97.7 F (36.5 C)  TempSrc: Oral Oral  Oral  Resp: 20 20  20   Height:      Weight:      SpO2: 97% 99% 99% 98%    General: awake, in nad Cardiovascular: regular,s1, s2 Respiratory: normal resp effort, no wheezing  Discharge Instructions     Medication List    STOP taking these medications        RESOURCE THICKENUP CLEAR Powd      TAKE these medications        acetaminophen 500 MG tablet  Commonly known as:  TYLENOL  Take 500 mg by mouth every 6 (six) hours as needed for moderate pain (left thigh).     albuterol 108 (90 Base) MCG/ACT inhaler  Commonly known as:  PROVENTIL HFA;VENTOLIN HFA  Inhale 2 puffs into the lungs every 4 (four) hours as needed for wheezing or shortness of breath.     allopurinol 300 MG tablet  Commonly known as:  ZYLOPRIM  Take 300 mg by mouth daily.     amLODipine 10 MG tablet  Commonly known as:  NORVASC  Take 1 tablet (10 mg total) by mouth daily.     aspirin 325 MG tablet  Take 1 tablet (325 mg total) by mouth daily.     clopidogrel 75 MG tablet  Commonly known as:  PLAVIX  Take 1 tablet (75 mg total) by mouth daily.     insulin NPH-regular Human (70-30) 100 UNIT/ML injection  Commonly known as:  NOVOLIN 70/30  Inject 50 Units into the skin 2 (two) times daily with a meal.     levETIRAcetam 500 MG tablet  Commonly known as:  KEPPRA  Take 1 tablet (500 mg total) by mouth  2 (two) times daily.     levothyroxine 75 MCG tablet  Commonly known as:  SYNTHROID, LEVOTHROID  Take 75 mcg by mouth every morning.     lisinopril 40 MG tablet  Commonly known as:  PRINIVIL,ZESTRIL  Take 40 mg by mouth daily.     metFORMIN 500 MG tablet  Commonly known as:  GLUCOPHAGE  Take 1 tablet (500 mg total) by mouth 2 (two) times daily.     metoprolol 50 MG tablet  Commonly known as:  LOPRESSOR  Take 0.5 tablets (25 mg total) by mouth 2 (two) times daily.     pantoprazole 40 MG tablet  Commonly known as:  PROTONIX  Take 1 tablet (40 mg total) by mouth daily before breakfast.     pravastatin 40 MG tablet  Commonly known as:  PRAVACHOL  Take 2 tablets (80 mg total) by mouth daily.     senna 8.6 MG tablet  Commonly known as:  SENOKOT  Take 1 tablet by mouth daily.     spironolactone 25 MG tablet  Commonly known as:  ALDACTONE  Take 25 mg by mouth daily.     vitamin B-12 1000 MCG tablet  Commonly known as:  CYANOCOBALAMIN  Take 1,000 mcg by mouth daily.       No Known Allergies Follow-up Information    Follow up with Tula Nakayama, MD On 07/19/2015.   Specialty:  Family Medicine   Why:  at 10:00 am   Contact information:   [address], [place] [address] [phone]        The results of significant diagnostics from this hospitalization (including imaging, microbiology, ancillary and laboratory) are listed below for reference.    Significant Diagnostic Studies: Dg Ribs Unilateral W/chest Right  06/01/2015  CLINICAL DATA:  Pain following fall EXAM: RIGHT RIBS AND CHEST - 3+ VIEW COMPARISON:  Chest radiograph May 15, 2015 FINDINGS: Frontal chest as well as oblique and cone-down lower rib images were obtained. The lungs are clear. The heart size and pulmonary vascularity are normal. No adenopathy. No pneumothorax or pleural effusion. There is a displaced fracture of the anterior right eleventh rib. No other fracture evident. IMPRESSION:  Mildly displaced fracture of the anterior right eleventh rib. No other fracture evident. No pneumothorax or effusion. Lungs clear. These results will be called to the ordering clinician or representative by the Radiologist Assistant, and communication documented in the PACS or zVision Dashboard. Electronically Signed   By: Lowella Grip III M.D.   On: 06/01/2015 21:45   Dg Thoracic Spine W/swimmers  06/01/2015  CLINICAL DATA:  Acute thoracic spine pain after fall 2 days ago. Initial encounter. EXAM: THORACIC SPINE - 3 VIEWS COMPARISON:  None. FINDINGS: There is no  evidence of thoracic spine fracture. Alignment is normal. No other significant bone abnormalities are identified. IMPRESSION: Normal thoracic spine. Electronically Signed   By: Marijo Conception, M.D.   On: 06/01/2015 19:53   Dg Lumbar Spine Complete  06/01/2015  CLINICAL DATA:  Acute lower back pain after fall 2 days ago. Initial encounter. EXAM: LUMBAR SPINE - COMPLETE 4+ VIEW COMPARISON:  CT scan of November 15, 2009. FINDINGS: No fracture or significant spondylolisthesis is noted. Moderate degenerative disc disease is noted at L2-3, L3-4 and L4-5 with anterior and posterior osteophyte formation. Atherosclerosis of abdominal aorta is noted. IMPRESSION: Moderate multilevel degenerative disc disease. No acute abnormality seen in the lumbar spine. Electronically Signed   By: Marijo Conception, M.D.   On: 06/01/2015 19:55   Ct Head Wo Contrast  06/24/2015  CLINICAL DATA:  Right-sided weakness beginning 30 minutes prior to presentation. EXAM: CT HEAD WITHOUT CONTRAST TECHNIQUE: Contiguous axial images were obtained from the base of the skull through the vertex without intravenous contrast. COMPARISON:  05/15/2015 FINDINGS: Skull and Sinuses:Negative for fracture or destructive process. Visualized orbits: Negative. Brain: No acute intracranial hemorrhage or visible acute infarct. There are known small vessel infarcts in the left pons, bilateral thalamus,  and possibly in the posterior limb left internal capsule (versus dilated perivascular space). ASPECTS is 10. There is small-vessel ischemic gliosis in the deep cerebral white matter, worse in the parietal region. No hydrocephalus. Extensive atherosclerotic calcification. No hydrocephalus. Critical Value/emergent results were called by telephone at the time of interpretation on 06/24/2015 at 6:25 am to Dr. Joseph Berkshire , who verbally acknowledged these results. IMPRESSION: 1. No acute hemorrhage or visible acute infarct. 2. Chronic small vessel disease with remote pontine and bilateral thalamic infarcts. Electronically Signed   By: Monte Fantasia M.D.   On: 06/24/2015 06:27   Mr [name] Head Wo Contrast  06/25/2015  CLINICAL DATA:  75 year old hypertensive male with right-sided weakness for 2 days. History of prior stroke. Subsequent encounter. EXAM: MRI HEAD WITHOUT CONTRAST MRA HEAD WITHOUT CONTRAST TECHNIQUE: Multiplanar, multiecho pulse sequences of the brain and surrounding structures were obtained without intravenous contrast. Angiographic images of the head were obtained using MRA technique without contrast. COMPARISON:  06/24/2015 head CT.  05/15/2015 brain MR. FINDINGS: MRI HEAD FINDINGS Exam is motion degraded. No acute infarct. T2 shine through left paracentral pons where acute infarct was noted in December 2016. Remote bilateral thalamic infarcts. Remote posterior left lenticular nucleus infarct. Mild chronic small vessel disease changes. Mild global atrophy without hydrocephalus. No intracranial mass lesion noted on this unenhanced exam. Mild exophthalmos. Cervical medullary junction unremarkable. MRA HEAD FINDINGS Exam is motion degraded. Narrowing distal cervical segment left internal carotid artery. Ectatic right internal carotid artery cervical/petrous junction. Ectatic internal carotid artery pre cavernous and cavernous segment. Loss of signal anterior aspect of the cavernous segment greater  on the left may be related to combination of narrowing and/or artifact. True stenosis on the left suspected. Ectatic carotid terminus Narrowing left middle cerebral artery distal M1 segment/left middle cerebral artery bifurcation extending to involve proximal left middle cerebral artery branches. Mild narrowing right middle cerebral artery M1 segment/bifurcation. Ectatic anterior cerebral artery with regions of mild narrowing bilaterally. Moderate narrowing distal right vertebral artery. Mild to moderate narrowing distal left vertebral artery. Poor delineation of the posterior inferior cerebellar arteries with only proximal aspect visualized. Moderate narrowing proximal basilar artery. Nonvisualized right anterior inferior cerebellar artery with only the proximal aspect of the left anterior inferior cerebellar artery  visualized. Moderate to marked stenosis of portions of the posterior cerebral artery more notable on the left. IMPRESSION: MRI HEAD Exam is motion degraded. No acute infarct. T2 shine through left paracentral pons where acute infarct was noted in December 2016. Remote bilateral thalamic infarcts. Remote posterior left lenticular nucleus infarct. Mild chronic small vessel disease changes. Mild global atrophy without hydrocephalus. MRA HEAD Prominent intracranial atherosclerotic type changes as detailed above. Electronically Signed   By: Genia Del M.D.   On: 06/25/2015 08:36   Mr Brain Wo Contrast  06/25/2015  CLINICAL DATA:  75 year old hypertensive male with right-sided weakness for 2 days. History of prior stroke. Subsequent encounter. EXAM: MRI HEAD WITHOUT CONTRAST MRA HEAD WITHOUT CONTRAST TECHNIQUE: Multiplanar, multiecho pulse sequences of the brain and surrounding structures were obtained without intravenous contrast. Angiographic images of the head were obtained using MRA technique without contrast. COMPARISON:  06/24/2015 head CT.  05/15/2015 brain MR. FINDINGS: MRI HEAD FINDINGS Exam  is motion degraded. No acute infarct. T2 shine through left paracentral pons where acute infarct was noted in December 2016. Remote bilateral thalamic infarcts. Remote posterior left lenticular nucleus infarct. Mild chronic small vessel disease changes. Mild global atrophy without hydrocephalus. No intracranial mass lesion noted on this unenhanced exam. Mild exophthalmos. Cervical medullary junction unremarkable. MRA HEAD FINDINGS Exam is motion degraded. Narrowing distal cervical segment left internal carotid artery. Ectatic right internal carotid artery cervical/petrous junction. Ectatic internal carotid artery pre cavernous and cavernous segment. Loss of signal anterior aspect of the cavernous segment greater on the left may be related to combination of narrowing and/or artifact. True stenosis on the left suspected. Ectatic carotid terminus Narrowing left middle cerebral artery distal M1 segment/left middle cerebral artery bifurcation extending to involve proximal left middle cerebral artery branches. Mild narrowing right middle cerebral artery M1 segment/bifurcation. Ectatic anterior cerebral artery with regions of mild narrowing bilaterally. Moderate narrowing distal right vertebral artery. Mild to moderate narrowing distal left vertebral artery. Poor delineation of the posterior inferior cerebellar arteries with only proximal aspect visualized. Moderate narrowing proximal basilar artery. Nonvisualized right anterior inferior cerebellar artery with only the proximal aspect of the left anterior inferior cerebellar artery visualized. Moderate to marked stenosis of portions of the posterior cerebral artery more notable on the left. IMPRESSION: MRI HEAD Exam is motion degraded. No acute infarct. T2 shine through left paracentral pons where acute infarct was noted in December 2016. Remote bilateral thalamic infarcts. Remote posterior left lenticular nucleus infarct. Mild chronic small vessel disease changes. Mild  global atrophy without hydrocephalus. MRA HEAD Prominent intracranial atherosclerotic type changes as detailed above. Electronically Signed   By: Genia Del M.D.   On: 06/25/2015 08:36   US Venous Img Lower Unilateral Right  06/20/2015  CLINICAL DATA:  Pain and swelling for 18 months. Previous tobacco abuse. EXAM: RIGHT LOWER EXTREMITY VENOUS DOPPLER ULTRASOUND TECHNIQUE: Gray-scale sonography with compression, as well as color and duplex ultrasound, were performed to evaluate the deep venous system from the level of the common femoral vein through the popliteal and proximal calf veins. COMPARISON:  None FINDINGS: Normal compressibility of the common femoral, superficial femoral, and popliteal veins, as well as the proximal calf veins. No filling defects to suggest DVT on grayscale or color Doppler imaging. Doppler waveforms show normal direction of venous flow, normal respiratory phasicity and response to augmentation. Visualized segments of the saphenous venous system normal in caliber and compressibility. Subcutaneous edema in the calf. Survey views of the contralateral common femoral vein are unremarkable.  IMPRESSION: 1. No evidence of lower extremity deep vein thrombosis, RIGHT. Electronically Signed   By: Lucrezia Europe M.D.   On: 06/20/2015 16:12   Dg Chest Port 1 View  06/22/2015  CLINICAL DATA:  Shortness of breath EXAM: PORTABLE CHEST 1 VIEW COMPARISON:  06/01/2015 FINDINGS: Normal heart size and mediastinal contours. No acute infiltrate or edema. No effusion or pneumothorax. No acute osseous findings. IMPRESSION: Negative portable chest. Electronically Signed   By: Monte Fantasia M.D.   On: 06/22/2015 01:25   Dg Hips Bilat W Or W/o Pelvis Min 5 Views  06/01/2015  CLINICAL DATA:  Pain following fall 2 days prior EXAM: DG HIP (WITH OR WITHOUT PELVIS) 5+V BILAT COMPARISON:  None. FINDINGS: Frontal pelvis as well as frontal and lateral views of each hip-total five views -were obtained. There is no  demonstrable fracture or dislocation. Joint spaces appear intact. No erosive change. There are scattered foci of arterial vascular calcification. Calcifications in the perineum probably represent small phleboliths. IMPRESSION: No demonstrable fracture or dislocation.  No apparent arthropathy. Electronically Signed   By: Lowella Grip III M.D.   On: 06/01/2015 21:49    Microbiology: Recent Results (from the past 240 hour(s))  MRSA PCR Screening     Status: Abnormal   Collection Time: 06/24/15  8:10 PM  Result Value Ref Range Status   MRSA by PCR POSITIVE (A) NEGATIVE Final    Comment:        The GeneXpert MRSA Assay (FDA approved for NASAL specimens only), is one component of a comprehensive MRSA colonization surveillance program. It is not intended to diagnose MRSA infection nor to guide or monitor treatment for MRSA infections. RESULT CALLED TO, READ BACK BY AND VERIFIED WITH: BULOCK, K. AT 2312 ON 06/24/2015 BY AGUNDIZ,E.       Labs: Basic Metabolic Panel:  Recent Labs Lab 06/22/15 0035 06/24/15 0554 06/25/15 0533  NA 141 140 137  K 4.9 4.6 5.0  CL 105 104 101  CO2 28 28 27   GLUCOSE 184* 48* 150*  BUN 23* 29* 31*  CREATININE 1.36* 1.04 1.10  CALCIUM 9.7 9.8 9.1   Liver Function Tests:  Recent Labs Lab 06/24/15 0554 06/25/15 0533  AST 15 14*  ALT 9* 11*  ALKPHOS 69 60  BILITOT 0.2* 0.4  PROT 7.0 6.7  ALBUMIN 3.4* 3.2*   No results for input(s): LIPASE, AMYLASE in the last 168 hours. No results for input(s): AMMONIA in the last 168 hours. CBC:  Recent Labs Lab 06/22/15 0035 06/24/15 0554 06/25/15 0533  WBC 12.3* 14.2* 12.8*  NEUTROABS 8.5* 9.1*  --   HGB 9.2* 9.9* 10.0*  HCT 29.7* 32.5* 31.6*  MCV 88.1 87.6 85.9  PLT 242 275 279   Cardiac Enzymes:  Recent Labs Lab 06/22/15 0035 06/24/15 0554  TROPONINI <0.03 <0.03   BNP: BNP (last 3 results)  Recent Labs  04/18/15 2200 06/22/15 0035  BNP 22.0 29.0    ProBNP (last 3  results) No results for input(s): PROBNP in the last 8760 hours.  CBG:  Recent Labs Lab 06/24/15 0712 06/24/15 0845 06/24/15 2140  GLUCAP 34* 74 197*    Signed:  CHIU, STEPHEN K  [facility] 06/26/2015, 2:19 PM

## 2015-06-26 NOTE — Care Management Note (Signed)
Case Management Note  Patient Details  Name: [name] MRN: [id number] Date of Birth: 1941-01-09  Expected Discharge Date:  06/26/15               Expected Discharge Plan:  [place]  In-House Referral:  NA  Discharge planning Services  CM Consult  Post Acute Care Choice:  Resumption of Svcs/PTA Provider Choice offered to:  Patient  DME Arranged:    DME Agency:     HH Arranged:  RN, PT [place] Agency:  [facility]  Status of Service:  Completed, signed off  Medicare Important Message Given:  N/A - LOS <3 / Initial given by admissions Date Medicare IM Given:    Medicare IM give by:    Date Additional Medicare IM Given:    Additional Medicare Important Message give by:     If discussed at [place] of Stay Meetings, dates discussed:    Additional Comments: Pt discharging home today with resumption of HH services through [facility], Romualdo Bolk, of [facility], made aware of DC today and will obtain pt info from chart. Pt aware HH has 48 hours to resume services.   Sherald Barge, RN 06/26/2015, 12:35 PM

## 2015-06-26 NOTE — Evaluation (Signed)
Occupational Therapy Evaluation Patient Details Name: [name] MRN: [id number] DOB: 1941/04/02 Today's Date: 06/26/2015    History of Present Illness [name] is a 75 y.o. male with hx of L sided CVA with extension in 12/16, CAD, copd, DM who presented to the emergency department complaints of new right-sided numbness and weakness. The patient presented to the emergency department where Code stroke was activated. CT head was unremarkable. Case was discussed with telemetry neurologist, who recommends repeat MRI and continued observation. At present, the patient continues complaint of right-sided numbness and reports difficulty raising his right leg off the bed, which is a new finding per the patient and his family. Hospitalist service was consulted for consideration for admission   Clinical Impression   Pt awake, alert, oriented this am, sitting up in chair on OT arrival. Pt has made great gains since previous admission on 05/04/15 for CVA. Pt reports symptoms have resolved and he is back to his baseline functioning since leaving [facility]. Pt able to complete ADL tasks with mod I, assistance required for donning socks as he is experiencing some RLE edema & has difficulty reaching his feet. No further acute OT services required, resumption of [facility] services on discharge.     Follow Up Recommendations  Home health OT (resume [facility] services)    Equipment Recommendations  None recommended by OT       Precautions / Restrictions Precautions Precautions: None Restrictions Weight Bearing Restrictions: No      Mobility Bed Mobility Overal bed mobility: Modified Independent                Transfers Overall transfer level: Modified independent                         ADL Overall ADL's : Modified independent Eating/Feeding: Modified independent                   Lower Body Dressing: Moderate assistance Lower Body Dressing Details (indicate cue type and reason):  Pt requires mod A due to RLE edema & difficulty reaching feet                     Vision Vision Assessment?: No apparent visual deficits          Pertinent Vitals/Pain Pain Assessment: No/denies pain     Hand Dominance Left   Extremity/Trunk Assessment Upper Extremity Assessment Upper Extremity Assessment: Overall WFL for tasks assessed;RUE deficits/detail RUE Deficits / Details: RUE weakness from previous CVA on 05/04/15. Strength 4/5   Lower Extremity Assessment Lower Extremity Assessment: Defer to PT evaluation       Communication Communication Communication: No difficulties   Cognition Arousal/Alertness: Awake/alert Behavior During Therapy: WFL for tasks assessed/performed Overall Cognitive Status: Within Functional Limits for tasks assessed                                Home Living Family/patient expects to be discharged to:: Private residence Living Arrangements: Spouse/significant other Available Help at Discharge: Family         Home Layout: One level     Bathroom Shower/Tub: Teacher, early years/pre: Standard     Home Equipment: Cane - single point;Walker - 4 wheels;Walker - 2 wheels      Lives With: Spouse    Prior Functioning/Environment Level of Independence: Independent with assistive device(s)  End of Session    Activity Tolerance: Patient tolerated treatment well Patient left: in chair;with call bell/phone within reach   Time: 0814-0830 OT Time Calculation (min): 16 min Charges:  OT General Charges $OT Visit: 1 Procedure OT Evaluation $OT Eval Low Complexity: 1 Procedure  Guadelupe Sabin, OTR/L  [phone]  06/26/2015, 9:13 AM

## 2015-06-26 NOTE — Care Management Important Message (Signed)
Important Message  Patient Details  Name: [name] MRN: [id number] Date of Birth: Oct 07, 1940   Medicare Important Message Given:  N/A - LOS <3 / Initial given by admissions    Sherald Barge, RN 06/26/2015, 12:35 PM

## 2015-06-26 NOTE — Consult Note (Signed)
   [facility] Inpatient Consult   06/26/2015  [name] 1941/04/16 [id number]  Patient is currently active with [facility] for chronic disease management services.  Patient has been engaged by a [employer] and LCSW.   Our community based plan of care has focused on disease management and community resource support.  Patient will receive a post discharge transition of care call and will be evaluated for monthly home visits for assessments and disease process education.   Made Inpatient Case Manager aware that [facility] following.  Of note, [facility] services does not replace or interfere with any services that are arranged by inpatient case management or social work.   For additional questions or referrals please contact:  Royetta Crochet. Laymond Purser, RN, BSN, [facility] Liaison [phone]

## 2015-06-26 NOTE — Progress Notes (Signed)
Pt's IV catheter removed and intact. Pt's IV site clean dry and intact. Discharge instructions, medications and follow up appointments reviewed and discussed with patient and his wife. Pt and his wife verbalized understanding of discharge instructions and medications. All questions were answered and no further questions at this time. Pt in stable condition and in no acute distress at this time. Pt escorted by nurse tech.

## 2015-06-27 ENCOUNTER — Other Ambulatory Visit: Payer: Self-pay | Admitting: *Deleted

## 2015-06-27 NOTE — Patient Outreach (Signed)
Call attempted for Transition of Care week #1 No answer, unable to leave a message. Plan to attempt again later. Royetta Crochet. Laymond Purser, RN, BSN, [place] [phone]

## 2015-06-28 ENCOUNTER — Other Ambulatory Visit: Payer: Self-pay | Admitting: *Deleted

## 2015-06-28 ENCOUNTER — Other Ambulatory Visit: Payer: Self-pay | Admitting: Licensed Clinical Social Worker

## 2015-06-28 DIAGNOSIS — D510 Vitamin B12 deficiency anemia due to intrinsic factor deficiency: Secondary | ICD-10-CM | POA: Diagnosis not present

## 2015-06-28 DIAGNOSIS — M159 Polyosteoarthritis, unspecified: Secondary | ICD-10-CM | POA: Diagnosis not present

## 2015-06-28 DIAGNOSIS — I69351 Hemiplegia and hemiparesis following cerebral infarction affecting right dominant side: Secondary | ICD-10-CM | POA: Diagnosis not present

## 2015-06-28 DIAGNOSIS — J449 Chronic obstructive pulmonary disease, unspecified: Secondary | ICD-10-CM | POA: Diagnosis not present

## 2015-06-28 DIAGNOSIS — N183 Chronic kidney disease, stage 3 (moderate): Secondary | ICD-10-CM | POA: Diagnosis not present

## 2015-06-28 DIAGNOSIS — E1122 Type 2 diabetes mellitus with diabetic chronic kidney disease: Secondary | ICD-10-CM | POA: Diagnosis not present

## 2015-06-28 DIAGNOSIS — M549 Dorsalgia, unspecified: Secondary | ICD-10-CM | POA: Diagnosis not present

## 2015-06-28 DIAGNOSIS — I2510 Atherosclerotic heart disease of native coronary artery without angina pectoris: Secondary | ICD-10-CM | POA: Diagnosis not present

## 2015-06-28 DIAGNOSIS — G40909 Epilepsy, unspecified, not intractable, without status epilepticus: Secondary | ICD-10-CM | POA: Diagnosis not present

## 2015-06-28 NOTE — Patient Outreach (Signed)
Assessment:  CSW called phone contact number for client on 06/28/15. CSW left phone message on 06/28/15 requesting that [name] or [name] please return call to CSW at [phone] to discuss client needs. Client had recently been receiving care at [facility]. He recently discharged from [facility] and returned to his home in the community.  He has support from his wife.  He has had a stroke and recently went to the Emergency  Room related to medical needs of client. While at [facility], client did receive scheduled physical therapy sessions for client and did receive needed nursing care.  Client sees Dr.Margaret Moshe Cipro as his primary care doctor.  CSW later spoke via phone on 06/28/15 with [name], spouse of client. CSW verified identity of [name].  [name] stated that client is receiving physical therapy support in the home through [facility].  [name] said client also receives in home nursing support as scheduled with [facility]. Client has prescribed medications and is taking medications as prescribed.  [name] said client was doing better since he sent to the emergency room recently.  He is eating well and sleeping adequately.  He uses a walker to ambulate.  [name] helps transport client to and from client medical appointments.  Client has an appointment scheduled with Dr. Moshe Cipro for 07/19/15.   CSW and [name] spoke of client care plan. [name] agreed that a care plan goal for client at present would be for client to continue to participate in scheduled in home physical therapy sessions for client with [place] physical therapist.  CSW informed [name] that [facility] RN [name] had been trying to call client to speak with him about his nursing needs. CSW invited [name] to call CSW at [phone] to discuss social work needs of client. CSW thanked [name] for her phone conversation with CSW on  06/28/15.   Plan: Client to participate in scheduled physical therapy sessions for client in home of client in next 30 days. CSW to collaborate with RN [name] in monitoring needs of client. CSW to call [name] in 4 weeks to assess client needs at that time.   [name].[name] MSW, LCSW Licensed Clinical Social Worker [facility] Care Management [phone]

## 2015-06-28 NOTE — Patient Outreach (Signed)
Call to patient wife's number [phone]. No answer, but able to leave a voicemail Requested call back and gave RNCM contact. Plan to follow up again tomorrow. Royetta Crochet. Laymond Purser, RN, BSN, [place] [phone]

## 2015-06-29 ENCOUNTER — Other Ambulatory Visit (HOSPITAL_COMMUNITY): Payer: Self-pay | Admitting: Respiratory Therapy

## 2015-06-29 DIAGNOSIS — G40909 Epilepsy, unspecified, not intractable, without status epilepticus: Secondary | ICD-10-CM | POA: Diagnosis not present

## 2015-06-29 DIAGNOSIS — J449 Chronic obstructive pulmonary disease, unspecified: Secondary | ICD-10-CM | POA: Diagnosis not present

## 2015-06-29 DIAGNOSIS — M159 Polyosteoarthritis, unspecified: Secondary | ICD-10-CM | POA: Diagnosis not present

## 2015-06-29 DIAGNOSIS — I69953 Hemiplegia and hemiparesis following unspecified cerebrovascular disease affecting right non-dominant side: Secondary | ICD-10-CM

## 2015-06-29 DIAGNOSIS — R1310 Dysphagia, unspecified: Secondary | ICD-10-CM

## 2015-06-29 DIAGNOSIS — I69351 Hemiplegia and hemiparesis following cerebral infarction affecting right dominant side: Secondary | ICD-10-CM | POA: Diagnosis not present

## 2015-06-29 DIAGNOSIS — R471 Dysarthria and anarthria: Secondary | ICD-10-CM

## 2015-06-29 DIAGNOSIS — G4733 Obstructive sleep apnea (adult) (pediatric): Secondary | ICD-10-CM

## 2015-06-29 DIAGNOSIS — N183 Chronic kidney disease, stage 3 (moderate): Secondary | ICD-10-CM | POA: Diagnosis not present

## 2015-06-29 DIAGNOSIS — R410 Disorientation, unspecified: Secondary | ICD-10-CM

## 2015-06-29 DIAGNOSIS — E1122 Type 2 diabetes mellitus with diabetic chronic kidney disease: Secondary | ICD-10-CM | POA: Diagnosis not present

## 2015-06-29 DIAGNOSIS — M549 Dorsalgia, unspecified: Secondary | ICD-10-CM | POA: Diagnosis not present

## 2015-06-29 DIAGNOSIS — I10 Essential (primary) hypertension: Secondary | ICD-10-CM

## 2015-06-29 DIAGNOSIS — I2510 Atherosclerotic heart disease of native coronary artery without angina pectoris: Secondary | ICD-10-CM | POA: Diagnosis not present

## 2015-06-29 DIAGNOSIS — D510 Vitamin B12 deficiency anemia due to intrinsic factor deficiency: Secondary | ICD-10-CM | POA: Diagnosis not present

## 2015-06-29 DIAGNOSIS — R260 Ataxic gait: Secondary | ICD-10-CM

## 2015-07-02 DIAGNOSIS — E1122 Type 2 diabetes mellitus with diabetic chronic kidney disease: Secondary | ICD-10-CM | POA: Diagnosis not present

## 2015-07-02 DIAGNOSIS — J449 Chronic obstructive pulmonary disease, unspecified: Secondary | ICD-10-CM | POA: Diagnosis not present

## 2015-07-02 DIAGNOSIS — D510 Vitamin B12 deficiency anemia due to intrinsic factor deficiency: Secondary | ICD-10-CM | POA: Diagnosis not present

## 2015-07-02 DIAGNOSIS — I2510 Atherosclerotic heart disease of native coronary artery without angina pectoris: Secondary | ICD-10-CM | POA: Diagnosis not present

## 2015-07-02 DIAGNOSIS — N183 Chronic kidney disease, stage 3 (moderate): Secondary | ICD-10-CM | POA: Diagnosis not present

## 2015-07-02 DIAGNOSIS — M549 Dorsalgia, unspecified: Secondary | ICD-10-CM | POA: Diagnosis not present

## 2015-07-02 DIAGNOSIS — I69351 Hemiplegia and hemiparesis following cerebral infarction affecting right dominant side: Secondary | ICD-10-CM | POA: Diagnosis not present

## 2015-07-02 DIAGNOSIS — G40909 Epilepsy, unspecified, not intractable, without status epilepticus: Secondary | ICD-10-CM | POA: Diagnosis not present

## 2015-07-02 DIAGNOSIS — M159 Polyosteoarthritis, unspecified: Secondary | ICD-10-CM | POA: Diagnosis not present

## 2015-07-03 ENCOUNTER — Other Ambulatory Visit: Payer: Self-pay

## 2015-07-03 DIAGNOSIS — N184 Chronic kidney disease, stage 4 (severe): Principal | ICD-10-CM

## 2015-07-03 DIAGNOSIS — IMO0002 Reserved for concepts with insufficient information to code with codable children: Secondary | ICD-10-CM

## 2015-07-03 DIAGNOSIS — J42 Unspecified chronic bronchitis: Secondary | ICD-10-CM

## 2015-07-03 DIAGNOSIS — E1121 Type 2 diabetes mellitus with diabetic nephropathy: Secondary | ICD-10-CM

## 2015-07-03 DIAGNOSIS — E1022 Type 1 diabetes mellitus with diabetic chronic kidney disease: Secondary | ICD-10-CM

## 2015-07-03 DIAGNOSIS — E1065 Type 1 diabetes mellitus with hyperglycemia: Principal | ICD-10-CM

## 2015-07-03 NOTE — Assessment & Plan Note (Signed)
Improved, however still a lot of concern at home as far as his ability to swallow safely, was actually in The ED earlier on the day of visit for choking episode  Following hospitalization, he was in SNF for 3 weeks for strengthening , has spent 1 night at home since that time. Encouraged pt to chew slowly and eat small portions, spouse will need to feed him initially as his ability to carry out instructions is limited by his recent CVA No labs indicated currently

## 2015-07-03 NOTE — Assessment & Plan Note (Signed)
cliniccally stable at this time

## 2015-07-03 NOTE — Assessment & Plan Note (Signed)
Controlled, no change in medication  

## 2015-07-03 NOTE — Assessment & Plan Note (Signed)
Decreased cognioive function, difficulty swallowing safely and facial droop are marked following recent stroke. Continues to have lability in emotions

## 2015-07-03 NOTE — Progress Notes (Signed)
   Subjective:    Patient ID: [name], male    DOB: 1940-12-03, 75 y.o.   MRN: [id number]  HPI Pt in for f/u from recent hospitalization folowed by 3 week stay in a SNF due to wekness and debility from a stroke. He was taken back to the ED this morning due to an apparent choking/ near syncopal episode while eating. Pt is accompanied by his wife who is somewhat overwhelmed, shee is home full time now to care for him. He is delighted to be home, recalls that he was cared for excellently, mood is positive and recall fair Medical records are reviewed at visit. Cognitive function and judgement are obviously impaired and he has a marked facial droop   Review of Systems See HPI Denies recent fever or chills. Denies sinus pressure, nasal congestion, ear pain or sore throat. Denies chest congestion, productive cough or wheezing. Denies chest pains, palpitations and leg swelling Denies abdominal pain, nausea, vomiting,diarrhea or constipation.   Denies dysuria, frequency, hesitancy or incontinence.  Denies skin break down or rash.        Objective:   Physical Exam  BP 140/74 mmHg  Pulse 94  Resp 18  Ht 5\' 11"  (1.803 m)  Wt 240 lb (108.863 kg)  BMI 33.49 kg/m2  SpO2 92%  Patient alert and in no cardiopulmonary distress.  HEENT:  facial asymmetry, EOMI,   oropharynx pink and moist.  Neck supple no JVD, no mass.  Chest: Clear to auscultation bilaterally.  CVS: S1, S2 no murmurs, no S3.Regular rate.  ABD: Soft non tender.   Ext: No edema  MS: decreasede ROM spine, shoulders, hips and knees.  Skin: Intact, no ulcerations or rash noted.  Psych: Good eye contact, t. Memory impaired not anxious or depressed appearing.       Assessment & Plan:  Hospital discharge follow-up Improved, however still a lot of concern at home as far as his ability to swallow safely, was actually in The ED earlier on the day of visit for choking episode  Following hospitalization, he  was in SNF for 3 weeks for strengthening , has spent 1 night at home since that time. Encouraged pt to chew slowly and eat small portions, spouse will need to feed him initially as his ability to carry out instructions is limited by his recent CVA No labs indicated currently   Essential hypertension Controlled, no change in medication   Coronary atherosclerosis cliniccally stable at this time  Stroke [facility]) Decreased cognioive function, difficulty swallowing safely and facial droop are marked following recent stroke. Continues to have lability in emotions  Type 2 diabetes mellitus with diabetic nephropathy ([place])

## 2015-07-04 ENCOUNTER — Other Ambulatory Visit: Payer: Self-pay | Admitting: *Deleted

## 2015-07-04 DIAGNOSIS — E1122 Type 2 diabetes mellitus with diabetic chronic kidney disease: Secondary | ICD-10-CM | POA: Diagnosis not present

## 2015-07-04 DIAGNOSIS — I69351 Hemiplegia and hemiparesis following cerebral infarction affecting right dominant side: Secondary | ICD-10-CM | POA: Diagnosis not present

## 2015-07-04 DIAGNOSIS — J449 Chronic obstructive pulmonary disease, unspecified: Secondary | ICD-10-CM | POA: Diagnosis not present

## 2015-07-04 DIAGNOSIS — N183 Chronic kidney disease, stage 3 (moderate): Secondary | ICD-10-CM | POA: Diagnosis not present

## 2015-07-04 DIAGNOSIS — G40909 Epilepsy, unspecified, not intractable, without status epilepticus: Secondary | ICD-10-CM | POA: Diagnosis not present

## 2015-07-04 DIAGNOSIS — M549 Dorsalgia, unspecified: Secondary | ICD-10-CM | POA: Diagnosis not present

## 2015-07-04 DIAGNOSIS — M159 Polyosteoarthritis, unspecified: Secondary | ICD-10-CM | POA: Diagnosis not present

## 2015-07-04 DIAGNOSIS — D510 Vitamin B12 deficiency anemia due to intrinsic factor deficiency: Secondary | ICD-10-CM | POA: Diagnosis not present

## 2015-07-04 DIAGNOSIS — I2510 Atherosclerotic heart disease of native coronary artery without angina pectoris: Secondary | ICD-10-CM | POA: Diagnosis not present

## 2015-07-04 NOTE — Patient Outreach (Signed)
Transition of care call. Patient reporting he is doing better, requests Home visit versus speaking on phone. Home visit scheduled for Friday 07/05/14 10am [facility] [place] E. Laymond Purser, RN, BSN, [place] [phone]

## 2015-07-06 ENCOUNTER — Encounter: Payer: Self-pay | Admitting: *Deleted

## 2015-07-06 ENCOUNTER — Other Ambulatory Visit: Payer: Self-pay | Admitting: *Deleted

## 2015-07-06 DIAGNOSIS — M549 Dorsalgia, unspecified: Secondary | ICD-10-CM | POA: Diagnosis not present

## 2015-07-06 DIAGNOSIS — D510 Vitamin B12 deficiency anemia due to intrinsic factor deficiency: Secondary | ICD-10-CM | POA: Diagnosis not present

## 2015-07-06 DIAGNOSIS — E1122 Type 2 diabetes mellitus with diabetic chronic kidney disease: Secondary | ICD-10-CM | POA: Diagnosis not present

## 2015-07-06 DIAGNOSIS — I2510 Atherosclerotic heart disease of native coronary artery without angina pectoris: Secondary | ICD-10-CM | POA: Diagnosis not present

## 2015-07-06 DIAGNOSIS — M159 Polyosteoarthritis, unspecified: Secondary | ICD-10-CM | POA: Diagnosis not present

## 2015-07-06 DIAGNOSIS — N183 Chronic kidney disease, stage 3 (moderate): Secondary | ICD-10-CM | POA: Diagnosis not present

## 2015-07-06 DIAGNOSIS — I69351 Hemiplegia and hemiparesis following cerebral infarction affecting right dominant side: Secondary | ICD-10-CM | POA: Diagnosis not present

## 2015-07-06 DIAGNOSIS — J449 Chronic obstructive pulmonary disease, unspecified: Secondary | ICD-10-CM | POA: Diagnosis not present

## 2015-07-06 DIAGNOSIS — G40909 Epilepsy, unspecified, not intractable, without status epilepticus: Secondary | ICD-10-CM | POA: Diagnosis not present

## 2015-07-06 NOTE — Patient Outreach (Signed)
[facility]) Care Management   07/06/2015  [name] 12/10/1940 [id number]  [name] is an 75 y.o. male  Subjective:  "I am doing much better, except for these passing out spells"  Patient states that they have run every test they could and still have not discovered why he is still passing out  Patient states he is using walker, now has bath bench. He is still getting Home health PT. Patient and wife report patient feet and legs have been swollen for a couple of weeks, no shortness of the breath though.  Wife reports she is doing ok with care giving role.   Upcoming appointments: Dr. Luan Pulling 07/23/15 Neuro 4/13 Dr. Moshe Cipro 5/3   Objective:   BP 124/80 mmHg  Pulse 78  Resp 20  Wt 244 lb (110.678 kg)  SpO2 93% Review of Systems  Constitutional: Negative.   HENT: Negative.   Eyes: Positive for double vision.  Respiratory: Negative.   Cardiovascular: Positive for leg swelling.  Gastrointestinal: Negative.   Genitourinary: Negative.   Musculoskeletal: Positive for falls.  Skin: Negative.   Neurological: Positive for seizures.       Hx of stroke right sided weakness  Endo/Heme/Allergies: Bruises/bleeds easily.       Blood thinner use  Psychiatric/Behavioral: Negative.     Physical Exam  Constitutional: He is oriented to person, place, and time. He appears well-developed and well-nourished.  Cardiovascular: Normal rate.   Respiratory: Breath sounds normal.  GI: Soft.  Musculoskeletal:  Right side   Neurological: He is oriented to person, place, and time.  Skin: Skin is dry.    Current Medications:   Current Outpatient Prescriptions  Medication Sig Dispense Refill  . acetaminophen (TYLENOL) 500 MG tablet Take 500 mg by mouth every 6 (six) hours as needed for moderate pain (left thigh).    Marland Kitchen albuterol (PROVENTIL HFA;VENTOLIN HFA) 108 (90 Base) MCG/ACT inhaler Inhale 2 puffs into the lungs every 4 (four) hours as needed for wheezing or  shortness of breath. 18 g 1  . allopurinol (ZYLOPRIM) 300 MG tablet Take 300 mg by mouth daily.    Marland Kitchen amLODipine (NORVASC) 10 MG tablet Take 1 tablet (10 mg total) by mouth daily. 90 tablet 1  . aspirin 325 MG tablet Take 1 tablet (325 mg total) by mouth daily.    . clopidogrel (PLAVIX) 75 MG tablet Take 1 tablet (75 mg total) by mouth daily.    . insulin NPH-regular Human (NOVOLIN 70/30) (70-30) 100 UNIT/ML injection Inject 50 Units into the skin 2 (two) times daily with a meal.    . levETIRAcetam (KEPPRA) 500 MG tablet Take 1 tablet (500 mg total) by mouth 2 (two) times daily. 60 tablet 0  . levothyroxine (SYNTHROID, LEVOTHROID) 75 MCG tablet Take 75 mcg by mouth every morning.     Marland Kitchen lisinopril (PRINIVIL,ZESTRIL) 40 MG tablet Take 40 mg by mouth daily. Reported on 07/06/2015    . metFORMIN (GLUCOPHAGE) 500 MG tablet Take 1 tablet (500 mg total) by mouth 2 (two) times daily. 60 tablet 3  . metoprolol (LOPRESSOR) 50 MG tablet Take 0.5 tablets (25 mg total) by mouth 2 (two) times daily. (Patient taking differently: Take 50 mg by mouth 2 (two) times daily. )    . pantoprazole (PROTONIX) 40 MG tablet Take 1 tablet (40 mg total) by mouth daily before breakfast. 30 tablet 1  . pravastatin (PRAVACHOL) 40 MG tablet Take 2 tablets (80 mg total) by mouth daily.    Marland Kitchen  senna (SENOKOT) 8.6 MG tablet Take 1 tablet by mouth daily.    Marland Kitchen spironolactone (ALDACTONE) 25 MG tablet Take 25 mg by mouth daily.    . vitamin B-12 (CYANOCOBALAMIN) 1000 MCG tablet Take 1,000 mcg by mouth daily.     No current facility-administered medications for this visit.    Functional Status:   In your present state of health, do you have any difficulty performing the following activities: 07/06/2015 06/26/2015  Hearing? N -  Vision? N -  Difficulty concentrating or making decisions? N -  Walking or climbing stairs? Y -  Dressing or bathing? Y -  Doing errands, shopping? Y N  Preparing Food and eating ? Y -  Using the Toilet? N -   In the past six months, have you accidently leaked urine? N -  Do you have problems with loss of bowel control? N -  Managing your Medications? Y -  Managing your Finances? Y -  Housekeeping or managing your Housekeeping? Y -    Fall/Depression Screening:    Fall Risk  07/06/2015 06/14/2015 05/24/2015 04/25/2015 03/27/2015  Falls in the past year? Yes Yes Yes Yes No  Number falls in past yr: 2 or more 2 or more 2 or more 2 or more -  Injury with Fall? - No No No -  Risk Factor Category  High Fall Risk High Fall Risk High Fall Risk High Fall Risk -  Risk for fall due to : Impaired balance/gait;Other (Comment) History of fall(s);Mental status change History of fall(s);Mental status change History of fall(s);Mental status change -  Risk for fall due to (comments): "passing out spells" - - - -  Follow up Education provided;Falls prevention discussed Falls evaluation completed;Education provided Falls evaluation completed;Education provided Falls evaluation completed;Education provided -   [facility] 2/9 Scores 06/14/2015 05/24/2015 04/25/2015 03/27/2015 02/27/2015 01/31/2015 11/30/2014  PHQ - 2 Score 0 0 0 0 0 0 0    Assessment:   COPD: no symptoms Diabetes: averages are better. Patient needs appointment with Dr. [name], [facility] called and left message with Dr. Liliane Channel office regarding appointment Safety: using walker, still working with Home Health PT. Caregiver stress: briefly discussed Respite program at [facility] center with wife and also discussed local senior center activities for socialization for patient and caregiver  Plan:   Continue transition of care program calls, visit again next month Assess for caregiver stress Continue education on safety Patient wife will make appointment with Dr. [name] when they call back   Royetta Crochet. Laymond Purser, RN, BSN, [place] [phone]

## 2015-07-09 DIAGNOSIS — I2510 Atherosclerotic heart disease of native coronary artery without angina pectoris: Secondary | ICD-10-CM | POA: Diagnosis not present

## 2015-07-09 DIAGNOSIS — I69351 Hemiplegia and hemiparesis following cerebral infarction affecting right dominant side: Secondary | ICD-10-CM | POA: Diagnosis not present

## 2015-07-09 DIAGNOSIS — G40909 Epilepsy, unspecified, not intractable, without status epilepticus: Secondary | ICD-10-CM | POA: Diagnosis not present

## 2015-07-09 DIAGNOSIS — E1122 Type 2 diabetes mellitus with diabetic chronic kidney disease: Secondary | ICD-10-CM | POA: Diagnosis not present

## 2015-07-09 DIAGNOSIS — M159 Polyosteoarthritis, unspecified: Secondary | ICD-10-CM | POA: Diagnosis not present

## 2015-07-09 DIAGNOSIS — M549 Dorsalgia, unspecified: Secondary | ICD-10-CM | POA: Diagnosis not present

## 2015-07-09 DIAGNOSIS — N183 Chronic kidney disease, stage 3 (moderate): Secondary | ICD-10-CM | POA: Diagnosis not present

## 2015-07-09 DIAGNOSIS — D510 Vitamin B12 deficiency anemia due to intrinsic factor deficiency: Secondary | ICD-10-CM | POA: Diagnosis not present

## 2015-07-09 DIAGNOSIS — J449 Chronic obstructive pulmonary disease, unspecified: Secondary | ICD-10-CM | POA: Diagnosis not present

## 2015-07-10 DIAGNOSIS — N183 Chronic kidney disease, stage 3 (moderate): Secondary | ICD-10-CM | POA: Diagnosis not present

## 2015-07-10 DIAGNOSIS — J449 Chronic obstructive pulmonary disease, unspecified: Secondary | ICD-10-CM | POA: Diagnosis not present

## 2015-07-10 DIAGNOSIS — M159 Polyosteoarthritis, unspecified: Secondary | ICD-10-CM | POA: Diagnosis not present

## 2015-07-10 DIAGNOSIS — I2510 Atherosclerotic heart disease of native coronary artery without angina pectoris: Secondary | ICD-10-CM | POA: Diagnosis not present

## 2015-07-10 DIAGNOSIS — G40909 Epilepsy, unspecified, not intractable, without status epilepticus: Secondary | ICD-10-CM | POA: Diagnosis not present

## 2015-07-10 DIAGNOSIS — E1122 Type 2 diabetes mellitus with diabetic chronic kidney disease: Secondary | ICD-10-CM | POA: Diagnosis not present

## 2015-07-10 DIAGNOSIS — D510 Vitamin B12 deficiency anemia due to intrinsic factor deficiency: Secondary | ICD-10-CM | POA: Diagnosis not present

## 2015-07-10 DIAGNOSIS — I69351 Hemiplegia and hemiparesis following cerebral infarction affecting right dominant side: Secondary | ICD-10-CM | POA: Diagnosis not present

## 2015-07-10 DIAGNOSIS — M549 Dorsalgia, unspecified: Secondary | ICD-10-CM | POA: Diagnosis not present

## 2015-07-11 DIAGNOSIS — I2510 Atherosclerotic heart disease of native coronary artery without angina pectoris: Secondary | ICD-10-CM | POA: Diagnosis not present

## 2015-07-11 DIAGNOSIS — I69351 Hemiplegia and hemiparesis following cerebral infarction affecting right dominant side: Secondary | ICD-10-CM | POA: Diagnosis not present

## 2015-07-11 DIAGNOSIS — M549 Dorsalgia, unspecified: Secondary | ICD-10-CM | POA: Diagnosis not present

## 2015-07-11 DIAGNOSIS — E1122 Type 2 diabetes mellitus with diabetic chronic kidney disease: Secondary | ICD-10-CM | POA: Diagnosis not present

## 2015-07-11 DIAGNOSIS — M159 Polyosteoarthritis, unspecified: Secondary | ICD-10-CM | POA: Diagnosis not present

## 2015-07-11 DIAGNOSIS — G40909 Epilepsy, unspecified, not intractable, without status epilepticus: Secondary | ICD-10-CM | POA: Diagnosis not present

## 2015-07-11 DIAGNOSIS — D510 Vitamin B12 deficiency anemia due to intrinsic factor deficiency: Secondary | ICD-10-CM | POA: Diagnosis not present

## 2015-07-11 DIAGNOSIS — N183 Chronic kidney disease, stage 3 (moderate): Secondary | ICD-10-CM | POA: Diagnosis not present

## 2015-07-11 DIAGNOSIS — J449 Chronic obstructive pulmonary disease, unspecified: Secondary | ICD-10-CM | POA: Diagnosis not present

## 2015-07-12 ENCOUNTER — Encounter: Payer: Self-pay | Admitting: Family Medicine

## 2015-07-12 ENCOUNTER — Ambulatory Visit (INDEPENDENT_AMBULATORY_CARE_PROVIDER_SITE_OTHER): Payer: Commercial Managed Care - HMO | Admitting: Family Medicine

## 2015-07-12 VITALS — BP 124/60 | HR 86 | Resp 18 | Ht 71.0 in | Wt 234.0 lb

## 2015-07-12 DIAGNOSIS — T17308S Unspecified foreign body in larynx causing other injury, sequela: Secondary | ICD-10-CM

## 2015-07-12 DIAGNOSIS — H6092 Unspecified otitis externa, left ear: Secondary | ICD-10-CM | POA: Insufficient documentation

## 2015-07-12 DIAGNOSIS — I10 Essential (primary) hypertension: Secondary | ICD-10-CM

## 2015-07-12 MED ORDER — NEOMYCIN-POLYMYXIN-HC 3.5-10000-1 OT SUSP: OTIC | Status: DC

## 2015-07-12 NOTE — Patient Instructions (Signed)
F/u as before  You will see Dr Merlene Laughter sooner re swallowing difficulty, for best options. I have NOT said that you definitely need tube feeding just that it may be an option  Will send ear drop to pharmacy

## 2015-07-13 ENCOUNTER — Other Ambulatory Visit: Payer: Self-pay | Admitting: *Deleted

## 2015-07-13 ENCOUNTER — Telehealth: Payer: Self-pay | Admitting: Family Medicine

## 2015-07-13 DIAGNOSIS — G40909 Epilepsy, unspecified, not intractable, without status epilepticus: Secondary | ICD-10-CM | POA: Diagnosis not present

## 2015-07-13 DIAGNOSIS — T17308A Unspecified foreign body in larynx causing other injury, initial encounter: Secondary | ICD-10-CM | POA: Insufficient documentation

## 2015-07-13 DIAGNOSIS — I69351 Hemiplegia and hemiparesis following cerebral infarction affecting right dominant side: Secondary | ICD-10-CM | POA: Diagnosis not present

## 2015-07-13 DIAGNOSIS — M159 Polyosteoarthritis, unspecified: Secondary | ICD-10-CM | POA: Diagnosis not present

## 2015-07-13 DIAGNOSIS — J449 Chronic obstructive pulmonary disease, unspecified: Secondary | ICD-10-CM | POA: Diagnosis not present

## 2015-07-13 DIAGNOSIS — N183 Chronic kidney disease, stage 3 (moderate): Secondary | ICD-10-CM | POA: Diagnosis not present

## 2015-07-13 DIAGNOSIS — I2510 Atherosclerotic heart disease of native coronary artery without angina pectoris: Secondary | ICD-10-CM | POA: Diagnosis not present

## 2015-07-13 DIAGNOSIS — D510 Vitamin B12 deficiency anemia due to intrinsic factor deficiency: Secondary | ICD-10-CM | POA: Diagnosis not present

## 2015-07-13 DIAGNOSIS — E1122 Type 2 diabetes mellitus with diabetic chronic kidney disease: Secondary | ICD-10-CM | POA: Diagnosis not present

## 2015-07-13 DIAGNOSIS — M549 Dorsalgia, unspecified: Secondary | ICD-10-CM | POA: Diagnosis not present

## 2015-07-13 HISTORY — DX: Unspecified foreign body in larynx causing other injury, initial encounter: T17.308A

## 2015-07-13 NOTE — Telephone Encounter (Signed)
Pls directly call neurology office and request sooner f/ou for pt with ongoing choking episodes I belive from hismstroke. Speech therapy sent him in yesterday for eval here I have also sent Dr Merlene Laughter a msg directly requesting sooner f/u Pls establish an appt date and time for him and let spouse know , ensure she understands, [name] If you have excessive difficulty with the process , pls let me know. No intention of making life difficult, just care for more "needy " pt ??/ concerns please ask!

## 2015-07-13 NOTE — Assessment & Plan Note (Addendum)
improved blood sugars. He is treated by endo for diabetes Mr. [name] is reminded of the importance of commitment to daily physical activity for 30 minutes or more, as able and the need to limit carbohydrate intake to 30 to 60 grams per meal to help with blood sugar control.   The need to take medication as prescribed, test blood sugar as directed, and to call between visits if there is a concern that blood sugar is uncontrolled is also discussed.   Mr. [name] is reminded of the importance of daily foot exam, annual eye examination, and good blood sugar, blood pressure and cholesterol control.  Diabetic Labs Latest Ref Rng 06/25/2015 06/24/2015 06/22/2015 06/17/2015 06/11/2015  HbA1c 4.8 - 5.6 % 7.2(H) - - - -  Chol 0 - 200 mg/dL 133 - - - -  HDL >40 mg/dL 26(L) - - - -  Calc LDL 0 - 99 mg/dL 72 - - - -  Triglycerides <150 mg/dL 175(H) - - - -  Creatinine 0.61 - 1.24 mg/dL 1.10 1.04 1.36(H) 1.01 1.00   BP/Weight 07/12/2015 07/06/2015 06/26/2015 06/24/2015 06/22/2015 [id number]  Systolic BP A999333 A999333 [id number] - [id number] -  Diastolic BP 60 80 63 - 74 66 -  Wt. (Lbs) 234 244 - 240 240 - 238  BMI 32.65 34.5 - 33.94 33.49 - 34.15   Foot/eye exam completion dates Latest Ref Rng 07/06/2015 12/28/2014  Eye Exam No Retinopathy - -  Foot Form Completion - Done Done

## 2015-07-13 NOTE — Patient Outreach (Signed)
Transition of Care call  Spoke with patient wife, [name] and patient. Saw Dr. Moshe Cipro yesterday, wants patient to be seen by neurologist soon, so is calling regarding follow up appointment Due to ongoing passing out spells. Wife states patient had a spell a couple of days ago, very concerned because they do not know cause or how to prevent them. Patient continues to use walker for safety.   Voices no new concerns for RNCM this call as he should be following up with neurologist regarding passing out spells.  PLAN:  THN CM Care Plan Problem One        Most Recent Value   Care Plan Problem One  Safety-increased Fall risk as evidenced by recent falls and "passing out spells"   Role Documenting the Problem One  Care Management [place] for Problem One  Active   THN Long Term Goal (31-90 days)  Patient will not have any additional falls over the next 31 days    Interventions for Problem One Long Term Goal  using teachback, reminded of safety precautions, verified patient has oversight from caregiver, using walker around home.    THN CM Short Term Goal #1 (0-30 days)  Patient will use walker or cane for ambulation around home over next 30 days    THN CM Short Term Goal #1 Start Date  07/06/15   Interventions for Short Term Goal #1  using teachback, reviewed use of walker versus cane. Patient now has bath bench, encouraged patient to allow wife to assist as necessary with ADLs and IADLs due to his safety risk      Plan for transition of care for 30 days, then will follow for COPD and monitor and assess for referral to [place] coach team. Will call again next week.  Royetta Crochet. Laymond Purser, RN, BSN, [place] [phone]

## 2015-07-13 NOTE — Progress Notes (Signed)
Subjective:    Patient ID: [name], male    DOB: Jul 17, 1940, 75 y.o.   MRN: [id number]  HPI Pt sent from speech therapy for evaluation for possible uncontrolled seizure evaluation. Wife reports , and has recordings of him continuing to have ongoing coughing and choking spells when swallowing , mainly food, but she reports , also when swallowing his saliva. She states he gets coghing fit as though he will pass out, which scares  them both and she will have to take him to the ED She has a episodes recorded on her cell phone which she brings in with her today I asked pt directly if he had to have a swallowing tube for safe and effective nutrition , he stated , yes if needed, verigfied by his wife. She became extremely nervous , tearful and agitated, stating that she would not know how to deliver the feeds, I assured her that if he needed this , then she would get all the necessary support until she felt comfortable with the process. I made it clear that I was not currently nm making a medical judgement that this was necessaryu, just making them aware that it mAY become an option I told her that what I saw on video and what is described  Are not seizures in m,y opinio, closer neuro eval is planned and possibly GI C/o left ear pressure , "as though something is in my ear" ever since I was in the hospital   Review of Systems See HPI Denies recent fever or chills. Denies sinus pressure, nasal congestion,  or sore throat. Denies chest congestion, productive cough or wheezing. Denies chest pains, palpitations and leg swelling Denies abdominal pain, nausea, vomiting,diarrhea or constipation.   Denies dysuria, frequency, hesitancy or incontinence. Denies skin break down or rash.        Objective:   Physical Exam BP 124/60 mmHg  Pulse 86  Resp 18  Ht 5\' 11"  (1.803 m)  Wt 234 lb (106.142 kg)  BMI 32.65 kg/m2  SpO2 90%  Patient alert and oriented and in no cardiopulmonary  distress.No choking spells witnessed during exam, but recorded on pt's wife cell phone  HEENT:  facial asymmetry, EOMI,   oropharynx pink and moist.  Neck decreased ROM no JVD, no mass.  Chest: Clear to auscultation bilaterally.  CVS: S1, S2 no murmurs, no S3.Regular rate.  ABD: Soft non tender.   Ext: No edema  MS: decreased ROM spine, shoulders, hips and knees.  Skin: Intact, no ulcerations or rash noted.          Assessment & Plan:  Choking Recurrent choking episodes in pt with stroke, has documented evidence of aspiration , sent today from speech therapy because of this concern andz a question as to whether he is having seizures. I believe this is all due his stroke/ neurologic disease, and not seizure disorder Will request sooner neurologic eval , also discussed the possibility that he MAY need a PEG tube if a persistent problem and no progress with speech therapy  Left otitis externa Antibiotic drop prescribed for topical use for limited duration  Essential hypertension Controlled, no change in medication   Type 2 diabetes mellitus with polyneuropathy ([place])  improved blood sugars. He is treated by endo for diabetes [name] is reminded of the importance of commitment to daily physical activity for 30 minutes or more, as able and the need to limit carbohydrate intake to 30 to 60 grams per meal to help  with blood sugar control.   The need to take medication as prescribed, test blood sugar as directed, and to call between visits if there is a concern that blood sugar is uncontrolled is also discussed.   [name] is reminded of the importance of daily foot exam, annual eye examination, and good blood sugar, blood pressure and cholesterol control.  Diabetic Labs Latest Ref Rng 06/25/2015 06/24/2015 06/22/2015 06/17/2015 06/11/2015  HbA1c 4.8 - 5.6 % 7.2(H) - - - -  Chol 0 - 200 mg/dL 133 - - - -  HDL >40 mg/dL 26(L) - - - -  Calc LDL 0 - 99 mg/dL 72 - - - -    Triglycerides <150 mg/dL 175(H) - - - -  Creatinine 0.61 - 1.24 mg/dL 1.10 1.04 1.36(H) 1.01 1.00   BP/Weight 07/12/2015 07/06/2015 06/26/2015 06/24/2015 06/22/2015 [id number]  Systolic BP A999333 A999333 [id number] - [id number] -  Diastolic BP 60 80 63 - 74 66 -  Wt. (Lbs) 234 244 - 240 240 - 238  BMI 32.65 34.5 - 33.94 33.49 - 34.15   Foot/eye exam completion dates Latest Ref Rng 07/06/2015 12/28/2014  Eye Exam No Retinopathy - -  Foot Form Completion - Done Done

## 2015-07-13 NOTE — Assessment & Plan Note (Addendum)
Antibiotic drop prescribed for topical use for limited duration

## 2015-07-13 NOTE — Assessment & Plan Note (Signed)
Controlled, no change in medication  

## 2015-07-13 NOTE — Assessment & Plan Note (Signed)
Recurrent choking episodes in pt with stroke, has documented evidence of aspiration , sent today from speech therapy because of this concern andz a question as to whether he is having seizures. I believe this is all due his stroke/ neurologic disease, and not seizure disorder Will request sooner neurologic eval , also discussed the possibility that he MAY need a PEG tube if a persistent problem and no progress with speech therapy

## 2015-07-16 ENCOUNTER — Encounter: Payer: Self-pay | Admitting: "Endocrinology

## 2015-07-16 ENCOUNTER — Ambulatory Visit (INDEPENDENT_AMBULATORY_CARE_PROVIDER_SITE_OTHER): Payer: Commercial Managed Care - HMO | Admitting: "Endocrinology

## 2015-07-16 VITALS — BP 131/66 | HR 78 | Ht 71.0 in | Wt 236.0 lb

## 2015-07-16 DIAGNOSIS — E785 Hyperlipidemia, unspecified: Secondary | ICD-10-CM

## 2015-07-16 DIAGNOSIS — E1121 Type 2 diabetes mellitus with diabetic nephropathy: Secondary | ICD-10-CM

## 2015-07-16 DIAGNOSIS — J449 Chronic obstructive pulmonary disease, unspecified: Secondary | ICD-10-CM | POA: Diagnosis not present

## 2015-07-16 DIAGNOSIS — E039 Hypothyroidism, unspecified: Secondary | ICD-10-CM

## 2015-07-16 DIAGNOSIS — M159 Polyosteoarthritis, unspecified: Secondary | ICD-10-CM | POA: Diagnosis not present

## 2015-07-16 DIAGNOSIS — D510 Vitamin B12 deficiency anemia due to intrinsic factor deficiency: Secondary | ICD-10-CM | POA: Diagnosis not present

## 2015-07-16 DIAGNOSIS — I10 Essential (primary) hypertension: Secondary | ICD-10-CM | POA: Diagnosis not present

## 2015-07-16 DIAGNOSIS — G40909 Epilepsy, unspecified, not intractable, without status epilepticus: Secondary | ICD-10-CM | POA: Diagnosis not present

## 2015-07-16 DIAGNOSIS — I2510 Atherosclerotic heart disease of native coronary artery without angina pectoris: Secondary | ICD-10-CM | POA: Diagnosis not present

## 2015-07-16 DIAGNOSIS — N183 Chronic kidney disease, stage 3 (moderate): Secondary | ICD-10-CM | POA: Diagnosis not present

## 2015-07-16 DIAGNOSIS — I69351 Hemiplegia and hemiparesis following cerebral infarction affecting right dominant side: Secondary | ICD-10-CM | POA: Diagnosis not present

## 2015-07-16 DIAGNOSIS — M549 Dorsalgia, unspecified: Secondary | ICD-10-CM | POA: Diagnosis not present

## 2015-07-16 DIAGNOSIS — E1122 Type 2 diabetes mellitus with diabetic chronic kidney disease: Secondary | ICD-10-CM | POA: Diagnosis not present

## 2015-07-16 NOTE — Progress Notes (Signed)
Subjective:    Patient ID: [name], male    DOB: Jan 29, 1941, PCP Tula Nakayama, MD   Past Medical History  Diagnosis Date  . GERD (gastroesophageal reflux disease)   . CAD (coronary artery disease)   . Obesity   . Hyperlipidemia   . Hypertension   . Chronic back pain   . Gout   . Chronic bronchitis ([place])     "get it q yr"  . Psoriasis   . COPD (chronic obstructive pulmonary disease) ([place])   . Myocardial infarction ([place]) 1999  . Pneumonia     "a few times; last time was ~ 06/2013"  . On home oxygen therapy     "2L; only at night" (08/30/2013)  . Diabetes mellitus, type 1   . Arthritis of knee   . Arthritis     "left leg" (08/30/2013)  . Vitamin B12 deficiency 05/01/2015  . Abnormality of gait 05/01/2015  . Tussive syncope 05/01/2015  . Stroke ([place])   . CVA (cerebral infarction)    Past Surgical History  Procedure Laterality Date  . Appendectomy    . Colonoscopy  03/17/2012    Procedure: COLONOSCOPY;  Surgeon: Rogene Houston, MD;  Location: [facility];  Service: Endoscopy;  Laterality: N/A;  830  . Cystoscopy with urethral dilatation N/A 11/26/2012    Procedure: CYSTOSCOPY WITH URETHRAL DILATATION;  Surgeon: Malka So, MD;  Location: [facility] ORS;  Service: Urology;  Laterality: N/A;  . Coronary angioplasty with stent placement  1999- 2009-08/30/2013    "counting today's, I have 5 stents" (08/30/2013)  . Left heart catheterization with coronary angiogram N/A 08/30/2013    Procedure: LEFT HEART CATHETERIZATION WITH CORONARY ANGIOGRAM;  Surgeon: Clent Demark, MD;  Location: [facility] CATH LAB;  Service: Cardiovascular;  Laterality: N/A;  . Percutaneous coronary stent intervention (pci-s) Right 08/30/2013    Procedure: PERCUTANEOUS CORONARY STENT INTERVENTION (PCI-S);  Surgeon: Clent Demark, MD;  Location: [facility] CATH LAB;  Service: Cardiovascular;  Laterality: Right;   Social History   Social History  . Marital Status: Married    Spouse Name: N/A  . Number of  Children: 2  . Years of Education: 11   Occupational History  . janitorial services     Social History Main Topics  . Smoking status: Former Smoker -- 0.50 packs/day for 33 years    Types: Cigarettes  . Smokeless tobacco: Never Used     Comment: "stopped smoking in the 1990's  . Alcohol Use: No  . Drug Use: No  . Sexual Activity: Not Currently   Other Topics Concern  . None   Social History Narrative   Patient drinks about 3 cups of soda daily.    Patient is left handed.    Outpatient Encounter Prescriptions as of 07/16/2015  Medication Sig  . acetaminophen (TYLENOL) 500 MG tablet Take 500 mg by mouth every 6 (six) hours as needed for moderate pain (left thigh).  Marland Kitchen albuterol (PROVENTIL HFA;VENTOLIN HFA) 108 (90 Base) MCG/ACT inhaler Inhale 2 puffs into the lungs every 4 (four) hours as needed for wheezing or shortness of breath.  . allopurinol (ZYLOPRIM) 300 MG tablet Take 300 mg by mouth daily.  Marland Kitchen amLODipine (NORVASC) 10 MG tablet Take 1 tablet (10 mg total) by mouth daily.  Marland Kitchen aspirin 325 MG tablet Take 1 tablet (325 mg total) by mouth daily.  . clopidogrel (PLAVIX) 75 MG tablet Take 1 tablet (75 mg total) by mouth daily.  . insulin NPH-regular  Human (NOVOLIN 70/30) (70-30) 100 UNIT/ML injection Inject 50 Units into the skin 2 (two) times daily with a meal.  . levothyroxine (SYNTHROID, LEVOTHROID) 75 MCG tablet Take 75 mcg by mouth every morning.   Marland Kitchen lisinopril (PRINIVIL,ZESTRIL) 40 MG tablet Take 40 mg by mouth daily. Reported on 07/06/2015  . metFORMIN (GLUCOPHAGE) 500 MG tablet Take 1 tablet (500 mg total) by mouth 2 (two) times daily.  . metoprolol (LOPRESSOR) 50 MG tablet Take 0.5 tablets (25 mg total) by mouth 2 (two) times daily. (Patient taking differently: Take 50 mg by mouth 2 (two) times daily. )  . pantoprazole (PROTONIX) 40 MG tablet Take 1 tablet (40 mg total) by mouth daily before breakfast.  . pravastatin (PRAVACHOL) 40 MG tablet Take 2 tablets (80 mg total) by  mouth daily.  Marland Kitchen senna (SENOKOT) 8.6 MG tablet Take 1 tablet by mouth daily.  Marland Kitchen spironolactone (ALDACTONE) 25 MG tablet Take 25 mg by mouth daily.  . vitamin B-12 (CYANOCOBALAMIN) 1000 MCG tablet Take 1,000 mcg by mouth daily.  Marland Kitchen levETIRAcetam (KEPPRA) 500 MG tablet Take 1 tablet (500 mg total) by mouth 2 (two) times daily.  Marland Kitchen neomycin-polymyxin-hydrocortisone (CORTISPORIN) 3.5-10000-1 otic suspension Apply one drop to left ear three tiomes daily for 1 week   No facility-administered encounter medications on file as of 07/16/2015.   ALLERGIES: No Known Allergies VACCINATION STATUS: Immunization History  Administered Date(s) Administered  . Influenza Whole 03/18/2005  . Pneumococcal Conjugate-13 12/08/2013  . Pneumococcal Polysaccharide-23 06/09/2012  . Td 12/04/2003    Diabetes He presents for his follow-up diabetic visit. He has type 2 diabetes mellitus. Onset time: He was diagnosed at approximate age of 59 years. His disease course has been stable. There are no hypoglycemic associated symptoms. Pertinent negatives for hypoglycemia include no confusion, headaches, pallor or seizures. Associated symptoms include polydipsia and polyuria. Pertinent negatives for diabetes include no chest pain, no fatigue, no polyphagia and no weakness. Symptoms are stable. Diabetic complications include a CVA, heart disease, nephropathy and peripheral neuropathy. Risk factors for coronary artery disease include dyslipidemia, diabetes mellitus, male sex, obesity, tobacco exposure and sedentary lifestyle. Current diabetic treatment includes insulin injections. He is compliant with treatment most of the time. He is following a generally unhealthy diet. When asked about meal planning, he reported none. Exercise: Currently undergoing physical therapy after recent cerebrovascular accident. Home blood sugar record trend: Patient did not bring his meter nor logs to review today. An ACE inhibitor/angiotensin II receptor  blocker is being taken.  Hyperlipidemia This is a chronic problem. The current episode started more than 1 year ago. The problem is controlled. Exacerbating diseases include diabetes, hypothyroidism and obesity. Pertinent negatives include no chest pain, myalgias or shortness of breath. Current antihyperlipidemic treatment includes statins. Risk factors for coronary artery disease include diabetes mellitus, dyslipidemia, hypertension, male sex and a sedentary lifestyle.  Hypertension This is a chronic problem. The current episode started more than 1 year ago. The problem is controlled. Pertinent negatives include no chest pain, headaches, neck pain, palpitations or shortness of breath. Past treatments include ACE inhibitors. Hypertensive end-organ damage includes CAD/MI, CVA and a thyroid problem.  Thyroid Problem Presents for follow-up visit. Patient reports no constipation, diarrhea, fatigue or palpitations. The symptoms have been stable. Past treatments include levothyroxine. His past medical history is significant for diabetes and hyperlipidemia.     Review of Systems  Constitutional: Negative for fever, chills, fatigue and unexpected weight change.  HENT: Negative for dental problem, mouth sores and trouble  swallowing.   Eyes: Negative for visual disturbance.  Respiratory: Negative for cough, choking, chest tightness, shortness of breath and wheezing.   Cardiovascular: Negative for chest pain, palpitations and leg swelling.  Gastrointestinal: Negative for nausea, vomiting, abdominal pain, diarrhea, constipation and abdominal distention.  Endocrine: Positive for polydipsia and polyuria. Negative for polyphagia.  Genitourinary: Negative for dysuria, urgency, hematuria and flank pain.  Musculoskeletal: Positive for gait problem. Negative for myalgias, back pain and neck pain.       He walks with a walker due to recent CVA.  Skin: Negative for pallor, rash and wound.  Neurological: Negative  for seizures, syncope, weakness, numbness and headaches.  Psychiatric/Behavioral: Negative.  Negative for confusion and dysphoric mood.    Objective:    BP 131/66 mmHg  Pulse 78  Ht 5\' 11"  (1.803 m)  Wt 236 lb (107.049 kg)  BMI 32.93 kg/m2  SpO2 94%  Wt Readings from Last 3 Encounters:  07/16/15 236 lb (107.049 kg)  07/12/15 234 lb (106.142 kg)  07/06/15 244 lb (110.678 kg)    Physical Exam  Constitutional: He is oriented to person, place, and time. He appears well-developed and well-nourished. He is cooperative. No distress.  HENT:  Head: Normocephalic and atraumatic.  Eyes: EOM are normal.  Neck: Normal range of motion. Neck supple. No tracheal deviation present. No thyromegaly present.  Cardiovascular: Normal rate, S1 normal, S2 normal and normal heart sounds.  Exam reveals no gallop.   No murmur heard. Pulses:      Dorsalis pedis pulses are 1+ on the right side, and 1+ on the left side.       Posterior tibial pulses are 1+ on the right side, and 1+ on the left side.  Pulmonary/Chest: Breath sounds normal. No respiratory distress. He has no wheezes.  Abdominal: Soft. Bowel sounds are normal. He exhibits no distension. There is no tenderness. There is no guarding and no CVA tenderness.  Musculoskeletal:       Right shoulder: He exhibits no swelling and no deformity.  Poor Hygiene and long toenails.  Neurological: He is alert and oriented to person, place, and time. He has normal strength and normal reflexes. No cranial nerve deficit or sensory deficit. Gait normal.  Skin: Skin is warm and dry. No rash noted. No cyanosis. Nails show no clubbing.  Psychiatric: He has a normal mood and affect. His speech is normal and behavior is normal. Judgment and thought content normal. Cognition and memory are normal.      CMP     Component Value Date/Time   NA 137 06/25/2015 0533   K 5.0 06/25/2015 0533   CL 101 06/25/2015 0533   CO2 27 06/25/2015 0533   GLUCOSE 150* 06/25/2015  0533   BUN 31* 06/25/2015 0533   CREATININE 1.10 06/25/2015 0533   CREATININE 1.04 03/31/2015 0841   CALCIUM 9.1 06/25/2015 0533   PROT 6.7 06/25/2015 0533   ALBUMIN 3.2* 06/25/2015 0533   AST 14* 06/25/2015 0533   ALT 11* 06/25/2015 0533   ALKPHOS 60 06/25/2015 0533   BILITOT 0.4 06/25/2015 0533   GFRNONAA >60 06/25/2015 0533   GFRNONAA 71 03/31/2015 0841   GFRAA >60 06/25/2015 0533   GFRAA 82 03/31/2015 0841     Diabetic Labs (most recent): Lab Results  Component Value Date   HGBA1C 7.2* 06/25/2015   HGBA1C 6.9* 06/04/2015   HGBA1C 7.1* 05/07/2015     Lipid Panel ( most recent) Lipid Panel     Component Value Date/Time  CHOL 133 06/25/2015 0533   TRIG 175* 06/25/2015 0533   HDL 26* 06/25/2015 0533   CHOLHDL 5.1 06/25/2015 0533   VLDL 35 06/25/2015 0533   LDLCALC 72 06/25/2015 0533   LDLDIRECT 50 08/18/2014 0737      Assessment & Plan:   1. Type 2 diabetes mellitus with diabetic nephropathy, unspecified long term insulin use status (HCC) -His diabetes is  complicated by coronary artery disease, CVA, CKD and patient remains at a high risk for more acute and chronic complications of diabetes which include CAD, CVA, CKD, retinopathy, and neuropathy. These are all discussed in detail with the patient.  Patient came with out his meter nor log of glucose profile, and  recent A1c of 7.2 %.    Recent labs reviewed.  - I have re-counseled the patient on diet management and weight loss  by adopting a carbohydrate restricted / protein rich  Diet.  - Suggestion is made for patient to avoid simple carbohydrates   from their diet including Cakes , Desserts, Ice Cream,  Soda (  diet and regular) , Sweet Tea , Candies,  Chips, Cookies, Artificial Sweeteners,   and "Sugar-free" Products .  This will help patient to have stable blood glucose profile and potentially avoid unintended  Weight gain.  - Patient is advised to stick to a routine mealtimes to eat 3 meals  a day and avoid  unnecessary snacks ( to snack only to correct hypoglycemia).  - The patient  has been  scheduled with Jearld Fenton, RDN, CDE for individualized DM education.  - I have approached patient with the following individualized plan to manage diabetes and patient agrees.  - I will proceed with premixed insulin Novolin 70/30 50 units with breakfast and 50 units with supper for pre-meal glucose of greater than 90 mg/dL. He is advised to continue strict  monitoring of glucose  AC and HS. - Patient is warned not to take insulin without proper monitoring per orders. -Adjustment parameters are given for hypo and hyperglycemia in writing. -Patient is encouraged to call clinic for blood glucose levels less than 70 or above 300 mg /dl. - I will continue metformin 500 mg by mouth twice a day, therapeutically suitable for patient.   - Patient specific target  for A1c; LDL, HDL, Triglycerides, and  Waist Circumference were discussed in detail.  2) BP/HTN: Controlled. Continue current medications including ACEI/ARB. 3) Lipids/HPL:  continue statins. 4)  Weight/Diet: CDE consult in progress, exercise, and carbohydrates information provided.  5) hypothyroidism: Clinically euthyroid. I will continue with levothyroxine 75 g by mouth every morning.  - We discussed about correct intake of levothyroxine, at fasting, with water, separated by at least 30 minutes from breakfast, and separated by more than 4 hours from calcium, iron, multivitamins, acid reflux medications (PPIs). -Patient is made aware of the fact that thyroid hormone replacement is needed for life, dose to be adjusted by periodic monitoring of thyroid function tests.  6) Chronic Care/Health Maintenance:  -Patient is on ACEI/ARB and Statin medications and encouraged to continue to follow up with Ophthalmology, Podiatrist at least yearly or according to recommendations, and advised to  stay away from smoking. I have recommended yearly flu vaccine and  pneumonia vaccination at least every 5 years; moderate intensity exercise for up to 150 minutes weekly; and  sleep for at least 7 hours a day.  - 25 minutes of time was spent on the care of this patient , 50% of which was applied for  counseling on diabetes complications and their preventions.  - I advised patient to maintain close follow up with Tula Nakayama, MD for primary care needs.  Patient is asked to bring meter and  blood glucose logs during their next visit.   Follow up plan: -Return in about 3 months (around 10/13/2015) for diabetes, high blood pressure, high cholesterol, underactive thyroid, follow up with pre-visit labs, meter, and logs.  Glade Lloyd, MD Phone: [phone]  Fax: [phone]   07/16/2015, 3:00 PM

## 2015-07-16 NOTE — Telephone Encounter (Signed)
According to Dr. Jannifer Franklin' office he has the first available at this time, Dr. Jannifer Franklin is scheduled out until July

## 2015-07-16 NOTE — Telephone Encounter (Signed)
Seeing Dr Merlene Laughter locally

## 2015-07-17 DIAGNOSIS — N183 Chronic kidney disease, stage 3 (moderate): Secondary | ICD-10-CM | POA: Diagnosis not present

## 2015-07-17 DIAGNOSIS — E1122 Type 2 diabetes mellitus with diabetic chronic kidney disease: Secondary | ICD-10-CM | POA: Diagnosis not present

## 2015-07-17 DIAGNOSIS — G40909 Epilepsy, unspecified, not intractable, without status epilepticus: Secondary | ICD-10-CM | POA: Diagnosis not present

## 2015-07-17 DIAGNOSIS — J449 Chronic obstructive pulmonary disease, unspecified: Secondary | ICD-10-CM | POA: Diagnosis not present

## 2015-07-17 DIAGNOSIS — M159 Polyosteoarthritis, unspecified: Secondary | ICD-10-CM | POA: Diagnosis not present

## 2015-07-17 DIAGNOSIS — D510 Vitamin B12 deficiency anemia due to intrinsic factor deficiency: Secondary | ICD-10-CM | POA: Diagnosis not present

## 2015-07-17 DIAGNOSIS — I69351 Hemiplegia and hemiparesis following cerebral infarction affecting right dominant side: Secondary | ICD-10-CM | POA: Diagnosis not present

## 2015-07-17 DIAGNOSIS — I2510 Atherosclerotic heart disease of native coronary artery without angina pectoris: Secondary | ICD-10-CM | POA: Diagnosis not present

## 2015-07-17 DIAGNOSIS — M549 Dorsalgia, unspecified: Secondary | ICD-10-CM | POA: Diagnosis not present

## 2015-07-17 NOTE — Telephone Encounter (Signed)
Saw Dr. Merlene Laughter for hosp f/u 1/30 and he is f/u on March 9 for an EEG  and March 22 for f/u for results

## 2015-07-18 DIAGNOSIS — M549 Dorsalgia, unspecified: Secondary | ICD-10-CM | POA: Diagnosis not present

## 2015-07-18 DIAGNOSIS — J449 Chronic obstructive pulmonary disease, unspecified: Secondary | ICD-10-CM | POA: Diagnosis not present

## 2015-07-18 DIAGNOSIS — N183 Chronic kidney disease, stage 3 (moderate): Secondary | ICD-10-CM | POA: Diagnosis not present

## 2015-07-18 DIAGNOSIS — M159 Polyosteoarthritis, unspecified: Secondary | ICD-10-CM | POA: Diagnosis not present

## 2015-07-18 DIAGNOSIS — I2510 Atherosclerotic heart disease of native coronary artery without angina pectoris: Secondary | ICD-10-CM | POA: Diagnosis not present

## 2015-07-18 DIAGNOSIS — D510 Vitamin B12 deficiency anemia due to intrinsic factor deficiency: Secondary | ICD-10-CM | POA: Diagnosis not present

## 2015-07-18 DIAGNOSIS — I69351 Hemiplegia and hemiparesis following cerebral infarction affecting right dominant side: Secondary | ICD-10-CM | POA: Diagnosis not present

## 2015-07-18 DIAGNOSIS — E1122 Type 2 diabetes mellitus with diabetic chronic kidney disease: Secondary | ICD-10-CM | POA: Diagnosis not present

## 2015-07-18 DIAGNOSIS — G40909 Epilepsy, unspecified, not intractable, without status epilepticus: Secondary | ICD-10-CM | POA: Diagnosis not present

## 2015-07-19 ENCOUNTER — Ambulatory Visit: Payer: Commercial Managed Care - HMO | Admitting: Family Medicine

## 2015-07-19 DIAGNOSIS — M549 Dorsalgia, unspecified: Secondary | ICD-10-CM | POA: Diagnosis not present

## 2015-07-19 DIAGNOSIS — I69351 Hemiplegia and hemiparesis following cerebral infarction affecting right dominant side: Secondary | ICD-10-CM | POA: Diagnosis not present

## 2015-07-19 DIAGNOSIS — G40909 Epilepsy, unspecified, not intractable, without status epilepticus: Secondary | ICD-10-CM | POA: Diagnosis not present

## 2015-07-19 DIAGNOSIS — I2510 Atherosclerotic heart disease of native coronary artery without angina pectoris: Secondary | ICD-10-CM | POA: Diagnosis not present

## 2015-07-19 DIAGNOSIS — M159 Polyosteoarthritis, unspecified: Secondary | ICD-10-CM | POA: Diagnosis not present

## 2015-07-19 DIAGNOSIS — D510 Vitamin B12 deficiency anemia due to intrinsic factor deficiency: Secondary | ICD-10-CM | POA: Diagnosis not present

## 2015-07-19 DIAGNOSIS — E1122 Type 2 diabetes mellitus with diabetic chronic kidney disease: Secondary | ICD-10-CM | POA: Diagnosis not present

## 2015-07-19 DIAGNOSIS — N183 Chronic kidney disease, stage 3 (moderate): Secondary | ICD-10-CM | POA: Diagnosis not present

## 2015-07-19 DIAGNOSIS — J449 Chronic obstructive pulmonary disease, unspecified: Secondary | ICD-10-CM | POA: Diagnosis not present

## 2015-07-20 ENCOUNTER — Other Ambulatory Visit: Payer: Self-pay | Admitting: *Deleted

## 2015-07-20 DIAGNOSIS — J449 Chronic obstructive pulmonary disease, unspecified: Secondary | ICD-10-CM | POA: Diagnosis not present

## 2015-07-20 DIAGNOSIS — E1122 Type 2 diabetes mellitus with diabetic chronic kidney disease: Secondary | ICD-10-CM | POA: Diagnosis not present

## 2015-07-20 DIAGNOSIS — D510 Vitamin B12 deficiency anemia due to intrinsic factor deficiency: Secondary | ICD-10-CM | POA: Diagnosis not present

## 2015-07-20 DIAGNOSIS — M549 Dorsalgia, unspecified: Secondary | ICD-10-CM | POA: Diagnosis not present

## 2015-07-20 DIAGNOSIS — M159 Polyosteoarthritis, unspecified: Secondary | ICD-10-CM | POA: Diagnosis not present

## 2015-07-20 DIAGNOSIS — G40909 Epilepsy, unspecified, not intractable, without status epilepticus: Secondary | ICD-10-CM | POA: Diagnosis not present

## 2015-07-20 DIAGNOSIS — N183 Chronic kidney disease, stage 3 (moderate): Secondary | ICD-10-CM | POA: Diagnosis not present

## 2015-07-20 DIAGNOSIS — I2510 Atherosclerotic heart disease of native coronary artery without angina pectoris: Secondary | ICD-10-CM | POA: Diagnosis not present

## 2015-07-20 DIAGNOSIS — I69351 Hemiplegia and hemiparesis following cerebral infarction affecting right dominant side: Secondary | ICD-10-CM | POA: Diagnosis not present

## 2015-07-20 NOTE — Patient Outreach (Signed)
Transition of Care call   Spoke with both patient and wife.  Patient doing well today Patient reports weight stable at 234. His blood sugar was 78 this am, drinking a little orange juice this morning. Patient has appointments next week with Dr. Luan Pulling and Dr. Merlene Laughter. Denies any issues shortness of breath No changes to medications    [facility] CM Care Plan Problem One        Most Recent Value   Care Plan Problem One  Safety-increased Fall risk as evidenced by recent falls and "passing out spells"   Role Documenting the Problem One  Care Management [place] for Problem One  Active   [facility] Long Term Goal (31-90 days)  Patient will not have any additional falls over the next 31 days    Interventions for Problem One Long Term Goal  Reviewed episodes, patient had another incident this week, seeing neurologist next week to assist with possible dx and tx plan, will provide ongoing monitoring for safety education needs    [facility] CM Short Term Goal #1 (0-30 days)  Patient will use walker or cane for ambulation around home over next 30 days    [facility] CM Short Term Goal #1 Start Date  07/06/15   Interventions for Short Term Goal #1  Confirmed with patient and wife that he is using walker for safety, encouraged patient around continued usage    [facility] CM Care Plan Problem Two        Most Recent Value   Care Plan Problem Two  COPD, knowledge deficit   Role Documenting the Problem Two  Care Management Coordinator   Care Plan for Problem Two  Active   Interventions for Problem Two Long Term Goal   Reviewed COPD zones and verified patient and wife undertstand medications and when to call MD   [facility] Long Term Goal (31-90) days  Patient will not have COPD exacerbation in the next 60 days   [facility] Long Term Goal Start Date  07/20/15      Wife agrees to call RNCM with any questions or concerns Plan to visit 3/17 [facility] E. Laymond Purser, RN, BSN, [place] [phone]

## 2015-07-23 DIAGNOSIS — J449 Chronic obstructive pulmonary disease, unspecified: Secondary | ICD-10-CM | POA: Diagnosis not present

## 2015-07-23 DIAGNOSIS — I10 Essential (primary) hypertension: Secondary | ICD-10-CM | POA: Diagnosis not present

## 2015-07-23 DIAGNOSIS — E1122 Type 2 diabetes mellitus with diabetic chronic kidney disease: Secondary | ICD-10-CM | POA: Diagnosis not present

## 2015-07-23 DIAGNOSIS — R1312 Dysphagia, oropharyngeal phase: Secondary | ICD-10-CM | POA: Diagnosis not present

## 2015-07-23 DIAGNOSIS — M549 Dorsalgia, unspecified: Secondary | ICD-10-CM | POA: Diagnosis not present

## 2015-07-23 DIAGNOSIS — I2510 Atherosclerotic heart disease of native coronary artery without angina pectoris: Secondary | ICD-10-CM | POA: Diagnosis not present

## 2015-07-23 DIAGNOSIS — I69351 Hemiplegia and hemiparesis following cerebral infarction affecting right dominant side: Secondary | ICD-10-CM | POA: Diagnosis not present

## 2015-07-23 DIAGNOSIS — N183 Chronic kidney disease, stage 3 (moderate): Secondary | ICD-10-CM | POA: Diagnosis not present

## 2015-07-23 DIAGNOSIS — D510 Vitamin B12 deficiency anemia due to intrinsic factor deficiency: Secondary | ICD-10-CM | POA: Diagnosis not present

## 2015-07-23 DIAGNOSIS — G40909 Epilepsy, unspecified, not intractable, without status epilepticus: Secondary | ICD-10-CM | POA: Diagnosis not present

## 2015-07-23 DIAGNOSIS — M159 Polyosteoarthritis, unspecified: Secondary | ICD-10-CM | POA: Diagnosis not present

## 2015-07-23 DIAGNOSIS — I69251 Hemiplegia and hemiparesis following other nontraumatic intracranial hemorrhage affecting right dominant side: Secondary | ICD-10-CM | POA: Diagnosis not present

## 2015-07-25 DIAGNOSIS — I2510 Atherosclerotic heart disease of native coronary artery without angina pectoris: Secondary | ICD-10-CM | POA: Diagnosis not present

## 2015-07-25 DIAGNOSIS — N183 Chronic kidney disease, stage 3 (moderate): Secondary | ICD-10-CM | POA: Diagnosis not present

## 2015-07-25 DIAGNOSIS — M159 Polyosteoarthritis, unspecified: Secondary | ICD-10-CM | POA: Diagnosis not present

## 2015-07-25 DIAGNOSIS — I69351 Hemiplegia and hemiparesis following cerebral infarction affecting right dominant side: Secondary | ICD-10-CM | POA: Diagnosis not present

## 2015-07-25 DIAGNOSIS — J449 Chronic obstructive pulmonary disease, unspecified: Secondary | ICD-10-CM | POA: Diagnosis not present

## 2015-07-25 DIAGNOSIS — D510 Vitamin B12 deficiency anemia due to intrinsic factor deficiency: Secondary | ICD-10-CM | POA: Diagnosis not present

## 2015-07-25 DIAGNOSIS — G40909 Epilepsy, unspecified, not intractable, without status epilepticus: Secondary | ICD-10-CM | POA: Diagnosis not present

## 2015-07-25 DIAGNOSIS — M549 Dorsalgia, unspecified: Secondary | ICD-10-CM | POA: Diagnosis not present

## 2015-07-25 DIAGNOSIS — E1122 Type 2 diabetes mellitus with diabetic chronic kidney disease: Secondary | ICD-10-CM | POA: Diagnosis not present

## 2015-07-26 ENCOUNTER — Other Ambulatory Visit: Payer: Self-pay

## 2015-07-26 ENCOUNTER — Other Ambulatory Visit: Payer: Self-pay | Admitting: Licensed Clinical Social Worker

## 2015-07-26 DIAGNOSIS — N183 Chronic kidney disease, stage 3 (moderate): Secondary | ICD-10-CM | POA: Diagnosis not present

## 2015-07-26 DIAGNOSIS — J449 Chronic obstructive pulmonary disease, unspecified: Secondary | ICD-10-CM | POA: Diagnosis not present

## 2015-07-26 DIAGNOSIS — I69351 Hemiplegia and hemiparesis following cerebral infarction affecting right dominant side: Secondary | ICD-10-CM | POA: Diagnosis not present

## 2015-07-26 DIAGNOSIS — E1122 Type 2 diabetes mellitus with diabetic chronic kidney disease: Secondary | ICD-10-CM | POA: Diagnosis not present

## 2015-07-26 DIAGNOSIS — I2510 Atherosclerotic heart disease of native coronary artery without angina pectoris: Secondary | ICD-10-CM | POA: Diagnosis not present

## 2015-07-26 DIAGNOSIS — M549 Dorsalgia, unspecified: Secondary | ICD-10-CM | POA: Diagnosis not present

## 2015-07-26 DIAGNOSIS — G40909 Epilepsy, unspecified, not intractable, without status epilepticus: Secondary | ICD-10-CM | POA: Diagnosis not present

## 2015-07-26 DIAGNOSIS — M159 Polyosteoarthritis, unspecified: Secondary | ICD-10-CM | POA: Diagnosis not present

## 2015-07-26 DIAGNOSIS — R569 Unspecified convulsions: Secondary | ICD-10-CM | POA: Diagnosis not present

## 2015-07-26 DIAGNOSIS — D510 Vitamin B12 deficiency anemia due to intrinsic factor deficiency: Secondary | ICD-10-CM | POA: Diagnosis not present

## 2015-07-26 MED ORDER — CLOPIDOGREL BISULFATE 75 MG PO TABS: 75.0000 mg | ORAL_TABLET | Freq: Every day | ORAL | Status: DC

## 2015-07-26 MED ORDER — PANTOPRAZOLE SODIUM 40 MG PO TBEC: 40.0000 mg | DELAYED_RELEASE_TABLET | Freq: Every day | ORAL | Status: DC

## 2015-07-26 NOTE — Patient Outreach (Signed)
Assessment:  CSW called home phone number of client on 07/26/15 and spoke via phone with [name], spouse of client. CSW verified identity of Kolin Dubs. [name] and [place] spoke of client needs.  Client has prescribed medications and is taking medications as prescribed.  Client is eating adequately and sleeping adequately.  [name] said client is still receiving physical therapy support in home with [place].  Client is also receiving speech therapy support.  Client saw Dr Moshe Cipro for an appointment in February of 2017. Client saw Dr.[place], neurologist, for an appointment on 07/26/15.  Client has no pain issues.  CSW and [name] spoke of client care plan. She agreed that a care plan goal for client would be that he continue to participate in all scheduled physical therapy sessions for client in the home in the next 30 days.CSW thanked [name] for phone conversation with CSW on 07/26/15.  CSW encouraged client or [name] to call CSW at [phone] as needed to discuss social work needs of client.   Plan:  Client will participate in all scheduled client in home physical therapy sessions for next 30 days. CSW to call client/Pauline Wlodarczyk in 4 weeks to assess client needs at that time.  [name].Shiza Thelen MSW, LCSW Licensed Clinical Social Worker [facility] Care Management [phone]

## 2015-07-27 ENCOUNTER — Telehealth: Payer: Self-pay

## 2015-07-27 DIAGNOSIS — M159 Polyosteoarthritis, unspecified: Secondary | ICD-10-CM | POA: Diagnosis not present

## 2015-07-27 DIAGNOSIS — E669 Obesity, unspecified: Secondary | ICD-10-CM | POA: Diagnosis not present

## 2015-07-27 DIAGNOSIS — M549 Dorsalgia, unspecified: Secondary | ICD-10-CM | POA: Diagnosis not present

## 2015-07-27 DIAGNOSIS — J449 Chronic obstructive pulmonary disease, unspecified: Secondary | ICD-10-CM | POA: Diagnosis not present

## 2015-07-27 DIAGNOSIS — I2510 Atherosclerotic heart disease of native coronary artery without angina pectoris: Secondary | ICD-10-CM | POA: Diagnosis not present

## 2015-07-27 DIAGNOSIS — I119 Hypertensive heart disease without heart failure: Secondary | ICD-10-CM | POA: Diagnosis not present

## 2015-07-27 DIAGNOSIS — E785 Hyperlipidemia, unspecified: Secondary | ICD-10-CM | POA: Diagnosis not present

## 2015-07-27 DIAGNOSIS — I69351 Hemiplegia and hemiparesis following cerebral infarction affecting right dominant side: Secondary | ICD-10-CM | POA: Diagnosis not present

## 2015-07-27 DIAGNOSIS — E119 Type 2 diabetes mellitus without complications: Secondary | ICD-10-CM | POA: Diagnosis not present

## 2015-07-27 DIAGNOSIS — E1122 Type 2 diabetes mellitus with diabetic chronic kidney disease: Secondary | ICD-10-CM | POA: Diagnosis not present

## 2015-07-27 DIAGNOSIS — G40909 Epilepsy, unspecified, not intractable, without status epilepticus: Secondary | ICD-10-CM | POA: Diagnosis not present

## 2015-07-27 DIAGNOSIS — N183 Chronic kidney disease, stage 3 (moderate): Secondary | ICD-10-CM | POA: Diagnosis not present

## 2015-07-27 DIAGNOSIS — I639 Cerebral infarction, unspecified: Secondary | ICD-10-CM | POA: Diagnosis not present

## 2015-07-27 DIAGNOSIS — I252 Old myocardial infarction: Secondary | ICD-10-CM | POA: Diagnosis not present

## 2015-07-27 DIAGNOSIS — D510 Vitamin B12 deficiency anemia due to intrinsic factor deficiency: Secondary | ICD-10-CM | POA: Diagnosis not present

## 2015-07-27 NOTE — Telephone Encounter (Signed)
Case manager at [employer] called to report he had completed the transition of care program but he did fall the other day because he didn't follow protocol and didn't use his walker when he went to the refrigerator and he drank some water and got choked and fell before he could get back to his chair- no injury reported. Was able to use chair as a base to lift himself back up.

## 2015-07-31 ENCOUNTER — Telehealth: Payer: Self-pay | Admitting: Family Medicine

## 2015-07-31 ENCOUNTER — Other Ambulatory Visit: Payer: Self-pay

## 2015-07-31 ENCOUNTER — Other Ambulatory Visit: Payer: Self-pay | Admitting: Family Medicine

## 2015-07-31 DIAGNOSIS — N183 Chronic kidney disease, stage 3 (moderate): Secondary | ICD-10-CM | POA: Diagnosis not present

## 2015-07-31 DIAGNOSIS — G40909 Epilepsy, unspecified, not intractable, without status epilepticus: Secondary | ICD-10-CM | POA: Diagnosis not present

## 2015-07-31 DIAGNOSIS — D510 Vitamin B12 deficiency anemia due to intrinsic factor deficiency: Secondary | ICD-10-CM | POA: Diagnosis not present

## 2015-07-31 DIAGNOSIS — I2510 Atherosclerotic heart disease of native coronary artery without angina pectoris: Secondary | ICD-10-CM | POA: Diagnosis not present

## 2015-07-31 DIAGNOSIS — E1122 Type 2 diabetes mellitus with diabetic chronic kidney disease: Secondary | ICD-10-CM | POA: Diagnosis not present

## 2015-07-31 DIAGNOSIS — M549 Dorsalgia, unspecified: Secondary | ICD-10-CM | POA: Diagnosis not present

## 2015-07-31 DIAGNOSIS — M159 Polyosteoarthritis, unspecified: Secondary | ICD-10-CM | POA: Diagnosis not present

## 2015-07-31 DIAGNOSIS — I69351 Hemiplegia and hemiparesis following cerebral infarction affecting right dominant side: Secondary | ICD-10-CM | POA: Diagnosis not present

## 2015-07-31 DIAGNOSIS — J449 Chronic obstructive pulmonary disease, unspecified: Secondary | ICD-10-CM | POA: Diagnosis not present

## 2015-07-31 MED ORDER — CLOPIDOGREL BISULFATE 75 MG PO TABS: 75.0000 mg | ORAL_TABLET | Freq: Every day | ORAL | Status: DC

## 2015-07-31 MED ORDER — LEVETIRACETAM 500 MG PO TABS: 500.0000 mg | ORAL_TABLET | Freq: Two times a day (BID) | ORAL | Status: DC

## 2015-07-31 MED ORDER — PRAVASTATIN SODIUM 40 MG PO TABS: 80.0000 mg | ORAL_TABLET | Freq: Every day | ORAL | Status: DC

## 2015-07-31 MED ORDER — PANTOPRAZOLE SODIUM 40 MG PO TBEC: 40.0000 mg | DELAYED_RELEASE_TABLET | Freq: Every day | ORAL | Status: DC

## 2015-07-31 NOTE — Telephone Encounter (Signed)
Ok to refill the keppra that was started in the hospital?

## 2015-07-31 NOTE — Telephone Encounter (Signed)
Med refilled.

## 2015-07-31 NOTE — Telephone Encounter (Signed)
Patient is calling for a refill on levETIRAcetam (KEPPRA) 500 MG tablet  Called to [place]

## 2015-07-31 NOTE — Telephone Encounter (Signed)
Yes pls do, send msg to Dr Freddie Apley nurse so he will continuie to refill also ensure pt knows to f/u with Dr Merlene Laughter

## 2015-08-01 DIAGNOSIS — M159 Polyosteoarthritis, unspecified: Secondary | ICD-10-CM | POA: Diagnosis not present

## 2015-08-01 DIAGNOSIS — J449 Chronic obstructive pulmonary disease, unspecified: Secondary | ICD-10-CM | POA: Diagnosis not present

## 2015-08-01 DIAGNOSIS — M549 Dorsalgia, unspecified: Secondary | ICD-10-CM | POA: Diagnosis not present

## 2015-08-01 DIAGNOSIS — G40909 Epilepsy, unspecified, not intractable, without status epilepticus: Secondary | ICD-10-CM | POA: Diagnosis not present

## 2015-08-01 DIAGNOSIS — E1122 Type 2 diabetes mellitus with diabetic chronic kidney disease: Secondary | ICD-10-CM | POA: Diagnosis not present

## 2015-08-01 DIAGNOSIS — I2510 Atherosclerotic heart disease of native coronary artery without angina pectoris: Secondary | ICD-10-CM | POA: Diagnosis not present

## 2015-08-01 DIAGNOSIS — D510 Vitamin B12 deficiency anemia due to intrinsic factor deficiency: Secondary | ICD-10-CM | POA: Diagnosis not present

## 2015-08-01 DIAGNOSIS — N183 Chronic kidney disease, stage 3 (moderate): Secondary | ICD-10-CM | POA: Diagnosis not present

## 2015-08-01 DIAGNOSIS — I69351 Hemiplegia and hemiparesis following cerebral infarction affecting right dominant side: Secondary | ICD-10-CM | POA: Diagnosis not present

## 2015-08-02 DIAGNOSIS — N183 Chronic kidney disease, stage 3 (moderate): Secondary | ICD-10-CM | POA: Diagnosis not present

## 2015-08-02 DIAGNOSIS — J449 Chronic obstructive pulmonary disease, unspecified: Secondary | ICD-10-CM | POA: Diagnosis not present

## 2015-08-02 DIAGNOSIS — M549 Dorsalgia, unspecified: Secondary | ICD-10-CM | POA: Diagnosis not present

## 2015-08-02 DIAGNOSIS — I69351 Hemiplegia and hemiparesis following cerebral infarction affecting right dominant side: Secondary | ICD-10-CM | POA: Diagnosis not present

## 2015-08-02 DIAGNOSIS — E1122 Type 2 diabetes mellitus with diabetic chronic kidney disease: Secondary | ICD-10-CM | POA: Diagnosis not present

## 2015-08-02 DIAGNOSIS — I2510 Atherosclerotic heart disease of native coronary artery without angina pectoris: Secondary | ICD-10-CM | POA: Diagnosis not present

## 2015-08-02 DIAGNOSIS — D510 Vitamin B12 deficiency anemia due to intrinsic factor deficiency: Secondary | ICD-10-CM | POA: Diagnosis not present

## 2015-08-02 DIAGNOSIS — G40909 Epilepsy, unspecified, not intractable, without status epilepticus: Secondary | ICD-10-CM | POA: Diagnosis not present

## 2015-08-02 DIAGNOSIS — M159 Polyosteoarthritis, unspecified: Secondary | ICD-10-CM | POA: Diagnosis not present

## 2015-08-03 ENCOUNTER — Encounter: Payer: Self-pay | Admitting: *Deleted

## 2015-08-03 ENCOUNTER — Other Ambulatory Visit: Payer: Self-pay | Admitting: *Deleted

## 2015-08-03 ENCOUNTER — Other Ambulatory Visit: Payer: Self-pay

## 2015-08-03 MED ORDER — LACRISERT 5 MG OP INST: 5.0000 mg | VAGINAL_INSERT | Freq: Every day | OPHTHALMIC | Status: DC

## 2015-08-03 NOTE — Patient Outreach (Signed)
Call to patient in regard to message from MD. No answer to any phones and no way to leave a message.  Will attempt to call again later. [name]. Laymond Purser, RN, BSN, [place] [phone]

## 2015-08-03 NOTE — Patient Outreach (Signed)
[facility]) Care Management   08/03/2015  [name] 03-05-1941 [id number]  [name] is an 75 y.o. male  Subjective:  "I am doing pretty good" "I don't ever have any pain" "Am I supposed to keep using these eye gtts?  Patient still getting [facility], PT and OT have ended. Wife continues to manage medications. Patient denies any new falls.  Patient saw Neurologist, he had EEG done, will get results next week at appointment. Patient saw Pulmonologist, new medication added for breathing.  Objective:   BP 120/58 mmHg  Pulse 92  Resp 20  Wt 232 lb (105.235 kg)  SpO2 94% Review of Systems  Constitutional: Negative.   HENT: Negative.   Eyes:       Right eye does not close completely  Respiratory: Positive for cough.        Dry cough  Cardiovascular: Negative.   Gastrointestinal: Negative.   Neurological:       Weakness on right side from stroke  Endo/Heme/Allergies: Negative.   Psychiatric/Behavioral: Negative.     Physical Exam  Constitutional: He is oriented to person, place, and time. He appears well-developed and well-nourished.  Cardiovascular: Normal rate.   GI: Soft.  Neurological: He is alert and oriented to person, place, and time.  Skin: Skin is warm and dry.  Psychiatric: He has a normal mood and affect.    Current Medications:   Current Outpatient Prescriptions  Medication Sig Dispense Refill  . acetaminophen (TYLENOL) 500 MG tablet Take 500 mg by mouth every 6 (six) hours as needed for moderate pain (left thigh).    Marland Kitchen albuterol (PROVENTIL HFA;VENTOLIN HFA) 108 (90 Base) MCG/ACT inhaler Inhale 2 puffs into the lungs every 4 (four) hours as needed for wheezing or shortness of breath. 18 g 1  . allopurinol (ZYLOPRIM) 300 MG tablet Take 300 mg by mouth daily.    Marland Kitchen amLODipine (NORVASC) 10 MG tablet Take 1 tablet (10 mg total) by mouth daily. 90 tablet 1  . aspirin 325 MG tablet Take 1 tablet (325 mg total) by mouth daily.    .  clopidogrel (PLAVIX) 75 MG tablet Take 1 tablet (75 mg total) by mouth daily. 90 tablet 0  . hydroxypropyl cellulose (LACRISERT) 5 MG INST 5 mg daily.    . insulin NPH-regular Human (NOVOLIN 70/30) (70-30) 100 UNIT/ML injection Inject 50 Units into the skin 2 (two) times daily with a meal.    . levETIRAcetam (KEPPRA) 500 MG tablet Take 1 tablet (500 mg total) by mouth 2 (two) times daily. 180 tablet 0  . levothyroxine (SYNTHROID, LEVOTHROID) 75 MCG tablet Take 75 mcg by mouth every morning.     Marland Kitchen lisinopril (PRINIVIL,ZESTRIL) 40 MG tablet Take 40 mg by mouth daily. Reported on 07/06/2015    . metFORMIN (GLUCOPHAGE) 500 MG tablet Take 1 tablet (500 mg total) by mouth 2 (two) times daily. 60 tablet 3  . metoprolol (LOPRESSOR) 50 MG tablet Take 0.5 tablets (25 mg total) by mouth 2 (two) times daily. (Patient taking differently: Take 50 mg by mouth 2 (two) times daily. )    . neomycin-polymyxin-hydrocortisone (CORTISPORIN) 3.5-10000-1 otic suspension Apply one drop to left ear three tiomes daily for 1 week 10 mL 0  . pantoprazole (PROTONIX) 40 MG tablet Take 1 tablet (40 mg total) by mouth daily before breakfast. 90 tablet 0  . pravastatin (PRAVACHOL) 40 MG tablet Take 2 tablets (80 mg total) by mouth daily. 180 tablet 0  . senna (SENOKOT)  8.6 MG tablet Take 1 tablet by mouth daily.    Marland Kitchen spironolactone (ALDACTONE) 25 MG tablet Take 25 mg by mouth daily.    Marland Kitchen tiotropium (SPIRIVA) 18 MCG inhalation capsule Place 18 mcg into inhaler and inhale daily.    . vitamin B-12 (CYANOCOBALAMIN) 1000 MCG tablet Take 1,000 mcg by mouth daily.     No current facility-administered medications for this visit.    Functional Status:   In your present state of health, do you have any difficulty performing the following activities: 07/06/2015 06/26/2015  Hearing? N -  Vision? N -  Difficulty concentrating or making decisions? N -  Walking or climbing stairs? Y -  Dressing or bathing? Y -  Doing errands, shopping? Y N   Preparing Food and eating ? Y -  Using the Toilet? N -  In the past six months, have you accidently leaked urine? N -  Do you have problems with loss of bowel control? N -  Managing your Medications? Y -  Managing your Finances? Y -  Housekeeping or managing your Housekeeping? Y -    Fall/Depression Screening:    Fall Risk  07/16/2015 07/06/2015 06/14/2015 05/24/2015 04/25/2015  Falls in the past year? Yes Yes Yes Yes Yes  Number falls in past yr: 2 or more 2 or more 2 or more 2 or more 2 or more  Injury with Fall? - - No No No  Risk Factor Category  High Fall Risk High Fall Risk High Fall Risk High Fall Risk High Fall Risk  Risk for fall due to : - Impaired balance/gait;Other (Comment) History of fall(s);Mental status change History of fall(s);Mental status change History of fall(s);Mental status change  Risk for fall due to (comments): - "passing out spells" - - -  Follow up Falls evaluation completed Education provided;Falls prevention discussed Falls evaluation completed;Education provided Falls evaluation completed;Education provided Falls evaluation completed;Education provided   [facility] 2/9 Scores 07/16/2015 06/14/2015 05/24/2015 04/25/2015 03/27/2015 02/27/2015 01/31/2015  PHQ - 2 Score 0 0 0 0 0 0 0    Assessment:   COPD-no new issues, Md added spiriva  Diabetes-doing well, range 78-182. Unable to get accurate averages as his stepson used his meter to check his blood sugars that were high and may skew patient averages. Patient to follow up with Dr. [name] in June.  Safety-no more falls, still no "diagnosis" for falls or passing out spells.    Plan:  [facility] CM Care Plan Problem One        Most Recent Value   Care Plan Problem One  Safety-increased Fall risk as evidenced by recent falls and "passing out spells"   Role Documenting the Problem One  Care Management [place] for Problem One  Active   THN Long Term Goal (31-90 days)  Patient will not have any additional falls over  the next 31 days    THN Long Term Goal Start Date  07/13/15   Interventions for Problem One Long Term Goal  Reviewed falls, none new since last call. reviewed tests and upcoming appointments   THN CM Short Term Goal #1 (0-30 days)  Patient will use walker or cane for ambulation around home over next 30 days    THN CM Short Term Goal #1 Start Date  07/06/15   Interventions for Short Term Goal #1  Reenforced safety and useof walker, Reviewed DME patient is using.     [facility] CM Care Plan Problem Two  Most Recent Value   Care Plan Problem Two  COPD, knowledge deficit   Role Documenting the Problem Two  Care Management Coordinator   Care Plan for Problem Two  Active   Interventions for Problem Two Long Term Goal   Reviewed new COPD medications from pulmonologist, reminded to of COPD zones to self monitor   THN Long Term Goal (31-90) days  Patient will not have COPD exacerbation in the next 60 days   THN Long Term Goal Start Date  07/20/15     In basket to MD regarding eye gtts Will let patient and wife now of reply Will call next month to assess for discharge.  Royetta Crochet. Laymond Purser, RN, BSN, [place] [phone]

## 2015-08-08 DIAGNOSIS — G4733 Obstructive sleep apnea (adult) (pediatric): Secondary | ICD-10-CM | POA: Diagnosis not present

## 2015-08-08 DIAGNOSIS — E0829 Diabetes mellitus due to underlying condition with other diabetic kidney complication: Secondary | ICD-10-CM | POA: Diagnosis not present

## 2015-08-08 DIAGNOSIS — R260 Ataxic gait: Secondary | ICD-10-CM | POA: Diagnosis not present

## 2015-08-08 DIAGNOSIS — R471 Dysarthria and anarthria: Secondary | ICD-10-CM | POA: Diagnosis not present

## 2015-08-08 DIAGNOSIS — G3184 Mild cognitive impairment, so stated: Secondary | ICD-10-CM | POA: Diagnosis not present

## 2015-08-08 DIAGNOSIS — I69953 Hemiplegia and hemiparesis following unspecified cerebrovascular disease affecting right non-dominant side: Secondary | ICD-10-CM | POA: Diagnosis not present

## 2015-08-08 DIAGNOSIS — I69991 Dysphagia following unspecified cerebrovascular disease: Secondary | ICD-10-CM | POA: Diagnosis not present

## 2015-08-08 DIAGNOSIS — I10 Essential (primary) hypertension: Secondary | ICD-10-CM | POA: Diagnosis not present

## 2015-08-09 DIAGNOSIS — I2510 Atherosclerotic heart disease of native coronary artery without angina pectoris: Secondary | ICD-10-CM | POA: Diagnosis not present

## 2015-08-09 DIAGNOSIS — I69351 Hemiplegia and hemiparesis following cerebral infarction affecting right dominant side: Secondary | ICD-10-CM | POA: Diagnosis not present

## 2015-08-09 DIAGNOSIS — M159 Polyosteoarthritis, unspecified: Secondary | ICD-10-CM | POA: Diagnosis not present

## 2015-08-09 DIAGNOSIS — G40909 Epilepsy, unspecified, not intractable, without status epilepticus: Secondary | ICD-10-CM | POA: Diagnosis not present

## 2015-08-09 DIAGNOSIS — J449 Chronic obstructive pulmonary disease, unspecified: Secondary | ICD-10-CM | POA: Diagnosis not present

## 2015-08-09 DIAGNOSIS — E1122 Type 2 diabetes mellitus with diabetic chronic kidney disease: Secondary | ICD-10-CM | POA: Diagnosis not present

## 2015-08-09 DIAGNOSIS — D510 Vitamin B12 deficiency anemia due to intrinsic factor deficiency: Secondary | ICD-10-CM | POA: Diagnosis not present

## 2015-08-09 DIAGNOSIS — N183 Chronic kidney disease, stage 3 (moderate): Secondary | ICD-10-CM | POA: Diagnosis not present

## 2015-08-09 DIAGNOSIS — M549 Dorsalgia, unspecified: Secondary | ICD-10-CM | POA: Diagnosis not present

## 2015-08-10 ENCOUNTER — Other Ambulatory Visit: Payer: Self-pay | Admitting: *Deleted

## 2015-08-10 NOTE — Patient Outreach (Signed)
Care coordination call. Call to patient home, spoke with patient wife. Discussed eye gtts refill, she states that the prescription cost was $400 and not covered so they had opted not to get eye gtt. Discussed need for diabetic eye exam, that is overdue. She states they will make appointment for eye exam. Wife reports patient saw Dr. Merlene Laughter, his EEG was clear, patient scheduled for sleep study in April.  Plan Call again in April, assess for discharge, wife agrees with plan Royetta Crochet. Laymond Purser, RN, BSN, [place] [phone]

## 2015-08-14 ENCOUNTER — Ambulatory Visit (INDEPENDENT_AMBULATORY_CARE_PROVIDER_SITE_OTHER): Payer: Commercial Managed Care - HMO | Admitting: Podiatry

## 2015-08-14 ENCOUNTER — Encounter: Payer: Self-pay | Admitting: Podiatry

## 2015-08-14 VITALS — BP 148/72 | HR 79 | Resp 12

## 2015-08-14 DIAGNOSIS — B351 Tinea unguium: Secondary | ICD-10-CM | POA: Diagnosis not present

## 2015-08-14 DIAGNOSIS — M79674 Pain in right toe(s): Secondary | ICD-10-CM | POA: Diagnosis not present

## 2015-08-14 DIAGNOSIS — M79675 Pain in left toe(s): Secondary | ICD-10-CM | POA: Diagnosis not present

## 2015-08-14 NOTE — Patient Instructions (Signed)
Diabetes and Foot Care Diabetes may cause you to have problems because of poor blood supply (circulation) to your feet and legs. This may cause the skin on your feet to become thinner, break easier, and heal more slowly. Your skin may become dry, and the skin may peel and crack. You may also have nerve damage in your legs and feet causing decreased feeling in them. You may not notice minor injuries to your feet that could lead to infections or more serious problems. Taking care of your feet is one of the most important things you can do for yourself.  HOME CARE INSTRUCTIONS  Wear shoes at all times, even in the house. Do not go barefoot. Bare feet are easily injured.  Check your feet daily for blisters, cuts, and redness. If you cannot see the bottom of your feet, use a mirror or ask someone for help.  Wash your feet with warm water (do not use hot water) and mild soap. Then pat your feet and the areas between your toes until they are completely dry. Do not soak your feet as this can dry your skin.  Apply a moisturizing lotion or petroleum jelly (that does not contain alcohol and is unscented) to the skin on your feet and to dry, brittle toenails. Do not apply lotion between your toes.  Trim your toenails straight across. Do not dig under them or around the cuticle. File the edges of your nails with an emery board or nail file.  Do not cut corns or calluses or try to remove them with medicine.  Wear clean socks or stockings every day. Make sure they are not too tight. Do not wear knee-high stockings since they may decrease blood flow to your legs.  Wear shoes that fit properly and have enough cushioning. To break in new shoes, wear them for just a few hours a day. This prevents you from injuring your feet. Always look in your shoes before you put them on to be sure there are no objects inside.  Do not cross your legs. This may decrease the blood flow to your feet.  If you find a minor scrape,  cut, or break in the skin on your feet, keep it and the skin around it clean and dry. These areas may be cleansed with mild soap and water. Do not cleanse the area with peroxide, alcohol, or iodine.  When you remove an adhesive bandage, be sure not to damage the skin around it.  If you have a wound, look at it several times a day to make sure it is healing.  Do not use heating pads or hot water bottles. They may burn your skin. If you have lost feeling in your feet or legs, you may not know it is happening until it is too late.  Make sure your health care provider performs a complete foot exam at least annually or more often if you have foot problems. Report any cuts, sores, or bruises to your health care provider immediately. SEEK MEDICAL CARE IF:   You have an injury that is not healing.  You have cuts or breaks in the skin.  You have an ingrown nail.  You notice redness on your legs or feet.  You feel burning or tingling in your legs or feet.  You have pain or cramps in your legs and feet.  Your legs or feet are numb.  Your feet always feel cold. SEEK IMMEDIATE MEDICAL CARE IF:   There is increasing redness,   swelling, or pain in or around a wound.  There is a red line that goes up your leg.  Pus is coming from a wound.  You develop a fever or as directed by your health care provider.  You notice a bad smell coming from an ulcer or wound.   This information is not intended to replace advice given to you by your health care provider. Make sure you discuss any questions you have with your health care provider.   Document Released: 05/02/2000 Document Revised: 01/05/2013 Document Reviewed: 10/12/2012 Elsevier Interactive Patient Education 2016 Elsevier Inc.  

## 2015-08-14 NOTE — Progress Notes (Signed)
   Subjective:    Patient ID: [name], male    DOB: Oct 03, 1940, 75 y.o.   MRN: [id number]  HPI    This patient presents today complaining of elongated and thickened toenails and right and left feet which have become progressively thicker and longer over multiple years and the last several months he is complaining that when he wears his shoes and walking. Patient ties any professional treatment for this problem  Patient is diabetic and denies any history of foot ulceration, claudication or amputation Describes CVA in January 2017 affecting his right lower extremity with gradual improvement with rehabilitation  Review of Systems  Musculoskeletal: Positive for joint swelling and gait problem.  Skin: Positive for color change.       Objective:   Physical Exam  Orientated 3  Vascular: Bilateral nonpitting peripheral edema DP pulses 0/4 bilaterally PT pulses 2/4 bilaterally Capillary reflex immediate bilaterally  Neurological: Sensation to 10 g monofilament wire intact 4/5 right 5/5 left Vibratory sensation nonreactive bilaterally Ankle reflex equal and reactive bilaterally  Dermatological: No open skin lesions bilaterally The toenails are elongated, incurvated, discolored, hypertrophic and tender to direct palpation 6-10  Musculoskeletal: No deformities noted bilaterally Manual motor testing dorsi flexion, plantar flexion, inversion, eversion 5/5 bilaterally Patient walks slowly with assistance of walker       Assessment & Plan:   Assessment: Decreased DP pulses without history of open lesions Decreased vibratory sensation and reflexes adjusted of neuropathy Symptomatic onychomycoses 6-10 Type II diabetic with a history of polyneuropathy  Plan: Today I reviewed the results of examination with patient today and recommended debridement of the mycotic toenails and he verbally consents  The toenails 6-10 were debrided mechanically and electrically without any  bleeding  Reappoint 3 months

## 2015-08-17 DIAGNOSIS — M549 Dorsalgia, unspecified: Secondary | ICD-10-CM | POA: Diagnosis not present

## 2015-08-17 DIAGNOSIS — D510 Vitamin B12 deficiency anemia due to intrinsic factor deficiency: Secondary | ICD-10-CM | POA: Diagnosis not present

## 2015-08-17 DIAGNOSIS — M159 Polyosteoarthritis, unspecified: Secondary | ICD-10-CM | POA: Diagnosis not present

## 2015-08-17 DIAGNOSIS — E1122 Type 2 diabetes mellitus with diabetic chronic kidney disease: Secondary | ICD-10-CM | POA: Diagnosis not present

## 2015-08-17 DIAGNOSIS — J449 Chronic obstructive pulmonary disease, unspecified: Secondary | ICD-10-CM | POA: Diagnosis not present

## 2015-08-17 DIAGNOSIS — I2510 Atherosclerotic heart disease of native coronary artery without angina pectoris: Secondary | ICD-10-CM | POA: Diagnosis not present

## 2015-08-17 DIAGNOSIS — G40909 Epilepsy, unspecified, not intractable, without status epilepticus: Secondary | ICD-10-CM | POA: Diagnosis not present

## 2015-08-17 DIAGNOSIS — N183 Chronic kidney disease, stage 3 (moderate): Secondary | ICD-10-CM | POA: Diagnosis not present

## 2015-08-17 DIAGNOSIS — I69351 Hemiplegia and hemiparesis following cerebral infarction affecting right dominant side: Secondary | ICD-10-CM | POA: Diagnosis not present

## 2015-08-19 ENCOUNTER — Ambulatory Visit: Payer: Commercial Managed Care - HMO | Attending: Neurology | Admitting: Sleep Medicine

## 2015-08-19 VITALS — Ht 71.0 in | Wt 236.0 lb

## 2015-08-19 DIAGNOSIS — R1310 Dysphagia, unspecified: Secondary | ICD-10-CM

## 2015-08-19 DIAGNOSIS — I10 Essential (primary) hypertension: Secondary | ICD-10-CM | POA: Diagnosis not present

## 2015-08-19 DIAGNOSIS — G4733 Obstructive sleep apnea (adult) (pediatric): Secondary | ICD-10-CM | POA: Diagnosis not present

## 2015-08-19 DIAGNOSIS — R260 Ataxic gait: Secondary | ICD-10-CM | POA: Diagnosis not present

## 2015-08-19 DIAGNOSIS — I69953 Hemiplegia and hemiparesis following unspecified cerebrovascular disease affecting right non-dominant side: Secondary | ICD-10-CM

## 2015-08-19 DIAGNOSIS — I69991 Dysphagia following unspecified cerebrovascular disease: Secondary | ICD-10-CM | POA: Diagnosis not present

## 2015-08-19 DIAGNOSIS — R569 Unspecified convulsions: Secondary | ICD-10-CM | POA: Diagnosis not present

## 2015-08-19 DIAGNOSIS — I639 Cerebral infarction, unspecified: Secondary | ICD-10-CM | POA: Diagnosis not present

## 2015-08-19 DIAGNOSIS — R471 Dysarthria and anarthria: Secondary | ICD-10-CM | POA: Diagnosis not present

## 2015-08-19 DIAGNOSIS — Z79899 Other long term (current) drug therapy: Secondary | ICD-10-CM | POA: Diagnosis not present

## 2015-08-19 DIAGNOSIS — R410 Disorientation, unspecified: Secondary | ICD-10-CM | POA: Diagnosis not present

## 2015-08-20 DIAGNOSIS — D510 Vitamin B12 deficiency anemia due to intrinsic factor deficiency: Secondary | ICD-10-CM | POA: Diagnosis not present

## 2015-08-20 DIAGNOSIS — M549 Dorsalgia, unspecified: Secondary | ICD-10-CM | POA: Diagnosis not present

## 2015-08-20 DIAGNOSIS — I2510 Atherosclerotic heart disease of native coronary artery without angina pectoris: Secondary | ICD-10-CM | POA: Diagnosis not present

## 2015-08-20 DIAGNOSIS — M159 Polyosteoarthritis, unspecified: Secondary | ICD-10-CM | POA: Diagnosis not present

## 2015-08-20 DIAGNOSIS — J449 Chronic obstructive pulmonary disease, unspecified: Secondary | ICD-10-CM | POA: Diagnosis not present

## 2015-08-20 DIAGNOSIS — N183 Chronic kidney disease, stage 3 (moderate): Secondary | ICD-10-CM | POA: Diagnosis not present

## 2015-08-20 DIAGNOSIS — I69351 Hemiplegia and hemiparesis following cerebral infarction affecting right dominant side: Secondary | ICD-10-CM | POA: Diagnosis not present

## 2015-08-20 DIAGNOSIS — E1122 Type 2 diabetes mellitus with diabetic chronic kidney disease: Secondary | ICD-10-CM | POA: Diagnosis not present

## 2015-08-20 DIAGNOSIS — G40909 Epilepsy, unspecified, not intractable, without status epilepticus: Secondary | ICD-10-CM | POA: Diagnosis not present

## 2015-08-23 ENCOUNTER — Encounter: Payer: Self-pay | Admitting: *Deleted

## 2015-08-23 ENCOUNTER — Other Ambulatory Visit: Payer: Self-pay | Admitting: *Deleted

## 2015-08-23 DIAGNOSIS — J441 Chronic obstructive pulmonary disease with (acute) exacerbation: Secondary | ICD-10-CM

## 2015-08-23 DIAGNOSIS — E109 Type 1 diabetes mellitus without complications: Secondary | ICD-10-CM

## 2015-08-23 NOTE — Patient Outreach (Signed)
Call to patient home for outreach/discharge call.  Patient has had sleep study, no results yet.  Patient has seen neurologist regarding his "passing out" he could not find a cause for it.  Patient has seen foot MD. He continues to see primary care MD regularly and sees specialists regularly.  Wife is reporting patient still having some passing out "every once in a while". Wife states patient is using his walker and she verifies she is providing Visual merchandiser.  Wife denies that patient is having any new issues or concerns for RNCM, "everything is good".    NO new questions or concerns, agree to transfer to Health Coach for COPD disease management as planned at last month's visit.   [facility] CM Care Plan Problem One        Most Recent Value   Care Plan Problem One  Safety-increased Fall risk as evidenced by recent falls and "passing out spells"   Role Documenting the Problem One  Care Management [place] for Problem One  Active   THN Long Term Goal (31-90 days)  Patient will not have any additional falls over the next 31 days    THN Long Term Goal Start Date  07/13/15   Interventions for Problem One Long Term Goal  reviewed safety with wife, verified she is provides24/7oversight and patient using walker    THN CM Short Term Goal #1 (0-30 days)  Patient will use walker or cane for ambulation around home over next 30 days    THN CM Short Term Goal #1 Start Date  08/23/15 [goal continued]    [facility] CM Care Plan Problem Two        Most Recent Value   Care Plan Problem Two  COPD, knowledge deficit   Role Documenting the Problem Two  Care Management Coordinator   Care Plan for Problem Two  Active   Interventions for Problem Two Long Term Goal   -- [Reminded of COPD zones in [place] [facility] calendar]   THN Long Term Goal (31-90) days  Patient will not have COPD exacerbation in the next 60 days   THN Long Term Goal Start Date  07/20/15     RNCM will send MD program closure letter. RNCM will  send order for Health Coach program and sign off case. Royetta Crochet. Laymond Purser, RN, BSN, [place] [phone]

## 2015-08-27 ENCOUNTER — Other Ambulatory Visit: Payer: Self-pay | Admitting: Licensed Clinical Social Worker

## 2015-08-27 NOTE — Patient Outreach (Signed)
Assessment:  CSW called [name], spouse of client on 08/27/15 and spoke via phone with [name]. CSW verified identity of [name]. [name] and [facility] spoke of client needs.  Client has prescribed medications and is taking medications as prescribed.  Client receives prescribed medications through [facility] mail order pharmacy service. Client uses walker to ambulate in the home. Client is eating well and sleeping well.  Client has completed all in home therapy services at this time.  RN Burgess Amor is referrng client to [facility] for further nursing support. CSW and [name] spoke of client care plan. [name] agreed to care plan goal for client of client attending all scheduled client medical appointments in the next 30 days. [name] said that client is scheduled for a medical appointment with Dr. Moshe Cipro, client's primary care doctor, next month.  CSW encouraged that client or [name] contact CSW as needed to discuss social work needs of client. CSW thanked [name] for phone call with CSW on 08/27/15.    Plan:  Client to attend all scheduled client medical appointments in the next 30 days. CSW to call [name] in 4 weeks to assess needs of client at that time.   [name].Danel Requena MSW, LCSW Licensed Clinical Social Worker [facility] Care Management [phone]

## 2015-08-30 ENCOUNTER — Ambulatory Visit: Payer: Commercial Managed Care - HMO | Admitting: Neurology

## 2015-08-30 ENCOUNTER — Other Ambulatory Visit: Payer: Self-pay

## 2015-08-30 NOTE — Patient Outreach (Signed)
[facility]) Care Management  08/30/2015  [name] 20-Feb-1941 [id number]   Telephone call to patient for introductory call.  Patient reports he is doing good and offers no concerns.  Explained health coach and role.  Patient verbalized understanding.   Plan: RN Health Coach will send welcome letter. RN Health Coach will contact patient within one week and patient agrees to next outreach.    Jone Baseman, RN, MSN [place] [phone]

## 2015-09-03 ENCOUNTER — Other Ambulatory Visit: Payer: Self-pay

## 2015-09-03 ENCOUNTER — Telehealth: Payer: Self-pay | Admitting: Family Medicine

## 2015-09-03 MED ORDER — AMLODIPINE BESYLATE 10 MG PO TABS: 10.0000 mg | ORAL_TABLET | Freq: Every day | ORAL | Status: DC

## 2015-09-03 MED ORDER — LISINOPRIL 40 MG PO TABS: 40.0000 mg | ORAL_TABLET | Freq: Every day | ORAL | Status: DC

## 2015-09-03 NOTE — Telephone Encounter (Signed)
lisinopril (PRINIVIL,ZESTRIL) 40 MG tablet and amLODipine (NORVASC) 10 MG tablet     Refills sent to [facility] please

## 2015-09-03 NOTE — Telephone Encounter (Signed)
Medications refilled to pharmacy.  

## 2015-09-04 NOTE — Sleep Study (Signed)
[place] A. Merlene Laughter, MD     [email or web address]             NOCTURNAL POLYSOMNOGRAPHY   LOCATION: [facility]  Patient Name: [name], [name] Date: 08/19/2015 Gender: Male D.O.B: April 09, 1941 Age (years): 3 Referring Provider: Not Available Height (inches): 71 Interpreting Physician: Phillips Odor MD, ABSM Weight (lbs): 236 RPSGT: Rosebud Poles BMI: 33 MRN: [id number] Neck Size: 18.50 CLINICAL INFORMATION Sleep Study Type: NPSG Indication for sleep study: N/A Epworth Sleepiness Score: 17 SLEEP STUDY TECHNIQUE As per the AASM Manual for the Scoring of Sleep and Associated Events v2.3 (April 2016) with a hypopnea requiring 4% desaturations. The channels recorded and monitored were frontal, central and occipital EEG, electrooculogram (EOG), submentalis EMG (chin), nasal and oral airflow, thoracic and abdominal wall motion, anterior tibialis EMG, snore microphone, electrocardiogram, and pulse oximetry. MEDICATIONS Patient's medications include: N/A. Medications self-administered by patient during sleep study : No sleep medicine administered.   Current outpatient prescriptions:  .  acetaminophen (TYLENOL) 500 MG tablet, Take 500 mg by mouth every 6 (six) hours as needed for moderate pain (left thigh)., Disp: , Rfl:  .  albuterol (PROVENTIL HFA;VENTOLIN HFA) 108 (90 Base) MCG/ACT inhaler, Inhale 2 puffs into the lungs every 4 (four) hours as needed for wheezing or shortness of breath., Disp: 18 g, Rfl: 1 .  allopurinol (ZYLOPRIM) 300 MG tablet, Take 300 mg by mouth daily., Disp: , Rfl:  .  amLODipine (NORVASC) 10 MG tablet, Take 1 tablet (10 mg total) by mouth daily., Disp: 90 tablet, Rfl: 1 .  aspirin 325 MG tablet, Take 1 tablet (325 mg total) by mouth daily., Disp: , Rfl:  .  clopidogrel (PLAVIX) 75 MG tablet, Take 1 tablet (75 mg total) by mouth daily., Disp: 90 tablet, Rfl: 0 .  hydroxypropyl cellulose (LACRISERT) 5 MG INST, Place 5 mg into the left  eye at bedtime., Disp: 60 each, Rfl: 0 .  insulin NPH-regular Human (NOVOLIN 70/30) (70-30) 100 UNIT/ML injection, Inject 50 Units into the skin 2 (two) times daily with a meal., Disp: , Rfl:  .  levETIRAcetam (KEPPRA) 500 MG tablet, Take 1 tablet (500 mg total) by mouth 2 (two) times daily., Disp: 180 tablet, Rfl: 0 .  levothyroxine (SYNTHROID, LEVOTHROID) 75 MCG tablet, Take 75 mcg by mouth every morning. , Disp: , Rfl:  .  lisinopril (PRINIVIL,ZESTRIL) 40 MG tablet, Take 1 tablet (40 mg total) by mouth daily. Reported on 07/06/2015, Disp: 90 tablet, Rfl: 1 .  metFORMIN (GLUCOPHAGE) 500 MG tablet, Take 1 tablet (500 mg total) by mouth 2 (two) times daily., Disp: 60 tablet, Rfl: 3 .  metoprolol (LOPRESSOR) 50 MG tablet, Take 0.5 tablets (25 mg total) by mouth 2 (two) times daily. (Patient taking differently: Take 50 mg by mouth 2 (two) times daily. ), Disp: , Rfl:  .  neomycin-polymyxin-hydrocortisone (CORTISPORIN) 3.5-10000-1 otic suspension, Apply one drop to left ear three tiomes daily for 1 week, Disp: 10 mL, Rfl: 0 .  pantoprazole (PROTONIX) 40 MG tablet, Take 1 tablet (40 mg total) by mouth daily before breakfast., Disp: 90 tablet, Rfl: 0 .  pravastatin (PRAVACHOL) 40 MG tablet, Take 2 tablets (80 mg total) by mouth daily., Disp: 180 tablet, Rfl: 0 .  senna (SENOKOT) 8.6 MG tablet, Take 1 tablet by mouth daily., Disp: , Rfl:  .  spironolactone (ALDACTONE) 25 MG tablet, Take 25 mg by mouth daily., Disp: , Rfl:  .  tiotropium (SPIRIVA) 18 MCG inhalation capsule, Place 18 mcg into inhaler  and inhale daily., Disp: , Rfl:  .  vitamin B-12 (CYANOCOBALAMIN) 1000 MCG tablet, Take 1,000 mcg by mouth daily., Disp: , Rfl:   SLEEP ARCHITECTURE The study was initiated at 10:41:18 PM and ended at 5:01:06 AM. Sleep onset time was 10.2 minutes and the sleep efficiency was 74.0%. The total sleep time was 281.1 minutes. Stage REM latency was 97.0 minutes. The patient spent 19.04% of the night in stage N1  sleep, 68.51% in stage N2 sleep, 0.00% in stage N3 and 12.45% in REM. Alpha intrusion was absent. Supine sleep was 4.78%. RESPIRATORY PARAMETERS The overall apnea/hypopnea index (AHI) was 32.9 per hour. There were 6 total apneas, including 6 obstructive, 0 central and 0 mixed apneas. There were 148 hypopneas and 0 RERAs. The AHI during Stage REM sleep was 54.9 per hour. AHI while supine was 49.2 per hour. The mean oxygen saturation was 84.29%. The minimum SpO2 during sleep was 61.00%. Moderate snoring was noted during this study.  Multiple episodes of desaturation without apneic events indicating hypoventilation.  CARDIAC DATA The 2 lead EKG demonstrated sinus rhythm. The mean heart rate was 79.72 beats per minute. Other EKG findings include: None. LEG MOVEMENT DATA The total PLMS were 0 with a resulting PLMS index of 0.00. Associated arousal with leg movement index was 0.0.   IMPRESSIONS - Moderately severe obstructive sleep apnea worse during REM sleep. Auto PAP 8-14 is suggest.  - Hypoventilation syndrome. Nocturnal oximetry on CPAP is also suggested after 1 month of CPAP use.  - Absent slow wave sleep.    Delano Metz, MD Diplomate, American Board of Sleep Medicine.

## 2015-09-10 DIAGNOSIS — G4733 Obstructive sleep apnea (adult) (pediatric): Secondary | ICD-10-CM | POA: Diagnosis not present

## 2015-09-10 DIAGNOSIS — R471 Dysarthria and anarthria: Secondary | ICD-10-CM | POA: Diagnosis not present

## 2015-09-10 DIAGNOSIS — R269 Unspecified abnormalities of gait and mobility: Secondary | ICD-10-CM | POA: Diagnosis not present

## 2015-09-10 DIAGNOSIS — R1310 Dysphagia, unspecified: Secondary | ICD-10-CM | POA: Diagnosis not present

## 2015-09-10 DIAGNOSIS — G8191 Hemiplegia, unspecified affecting right dominant side: Secondary | ICD-10-CM | POA: Diagnosis not present

## 2015-09-14 ENCOUNTER — Telehealth: Payer: Self-pay | Admitting: Orthopaedic Surgery

## 2015-09-14 ENCOUNTER — Other Ambulatory Visit: Payer: Self-pay | Admitting: Family Medicine

## 2015-09-14 ENCOUNTER — Other Ambulatory Visit: Payer: Self-pay | Admitting: "Endocrinology

## 2015-09-18 ENCOUNTER — Other Ambulatory Visit: Payer: Self-pay | Admitting: Family Medicine

## 2015-09-18 ENCOUNTER — Other Ambulatory Visit: Payer: Self-pay

## 2015-09-18 MED ORDER — CLOPIDOGREL BISULFATE 75 MG PO TABS: 75.0000 mg | ORAL_TABLET | Freq: Every day | ORAL | Status: DC

## 2015-09-18 MED ORDER — LEVETIRACETAM 500 MG PO TABS: 500.0000 mg | ORAL_TABLET | Freq: Two times a day (BID) | ORAL | Status: DC

## 2015-09-18 MED ORDER — PANTOPRAZOLE SODIUM 40 MG PO TBEC: 40.0000 mg | DELAYED_RELEASE_TABLET | Freq: Every day | ORAL | Status: DC

## 2015-09-18 NOTE — Telephone Encounter (Signed)
Rx Done . 

## 2015-09-19 ENCOUNTER — Encounter: Payer: Self-pay | Admitting: Family Medicine

## 2015-09-19 ENCOUNTER — Ambulatory Visit (INDEPENDENT_AMBULATORY_CARE_PROVIDER_SITE_OTHER): Payer: Commercial Managed Care - HMO | Admitting: Family Medicine

## 2015-09-19 VITALS — BP 128/74 | HR 77 | Resp 16 | Ht 71.0 in | Wt 245.0 lb

## 2015-09-19 DIAGNOSIS — K219 Gastro-esophageal reflux disease without esophagitis: Secondary | ICD-10-CM

## 2015-09-19 DIAGNOSIS — R05 Cough: Secondary | ICD-10-CM

## 2015-09-19 DIAGNOSIS — R569 Unspecified convulsions: Secondary | ICD-10-CM

## 2015-09-19 DIAGNOSIS — R054 Cough syncope: Secondary | ICD-10-CM

## 2015-09-19 DIAGNOSIS — L408 Other psoriasis: Secondary | ICD-10-CM

## 2015-09-19 DIAGNOSIS — Z794 Long term (current) use of insulin: Secondary | ICD-10-CM

## 2015-09-19 DIAGNOSIS — E1121 Type 2 diabetes mellitus with diabetic nephropathy: Secondary | ICD-10-CM | POA: Diagnosis not present

## 2015-09-19 DIAGNOSIS — E038 Other specified hypothyroidism: Secondary | ICD-10-CM

## 2015-09-19 DIAGNOSIS — J449 Chronic obstructive pulmonary disease, unspecified: Secondary | ICD-10-CM

## 2015-09-19 DIAGNOSIS — R471 Dysarthria and anarthria: Secondary | ICD-10-CM

## 2015-09-19 DIAGNOSIS — Z125 Encounter for screening for malignant neoplasm of prostate: Secondary | ICD-10-CM

## 2015-09-19 DIAGNOSIS — I10 Essential (primary) hypertension: Secondary | ICD-10-CM | POA: Diagnosis not present

## 2015-09-19 DIAGNOSIS — E785 Hyperlipidemia, unspecified: Secondary | ICD-10-CM

## 2015-09-19 MED ORDER — TRIAMCINOLONE ACETONIDE 0.1 % EX CREA: 1.0000 "application " | TOPICAL_CREAM | Freq: Two times a day (BID) | CUTANEOUS | Status: DC

## 2015-09-19 NOTE — Progress Notes (Signed)
Subjective:    Patient ID: [name], male    DOB: May 18, 1941, 75 y.o.   MRN: [id number]  HPI    [name]     MRN: [id number]      DOB: [birth year]   HPI [name] is here for follow up and re-evaluation of chronic medical conditions, medication management and review of any available recent lab and radiology data.  Preventive health is updated, specifically  Cancer screening and Immunization.   Questions or concerns regarding consultations or procedures which the PT has had in the interim are  Addressed.Has been seen by neurology and pulmonary since last visit The PT denies any adverse reactions to current medications since the last visit.  Still having choking/ unresponsive episodes, most recent was 1 night ago. States overall improved however Blood sugar was over 400 recently after eating large slice of chocolate cake  ROS Denies recent fever or chills. Denies sinus pressure, nasal congestion, ear pain or sore throat. Denies chest congestion, productive cough or wheezing. Denies chest pains, palpitations and leg swelling Denies abdominal pain, nausea, vomiting,diarrhea or constipation.   Denies dysuria, frequency, hesitancy or incontinence. C/o chronic joint pain, swelling and limitation in mobility. Denies headaches, seizures, numbness, or tingling. Denies depression, anxiety or insomnia. C/o dark rash on right elbow wants cream sent in PE  BP 128/74 mmHg  Pulse 77  Resp 16  Ht 5\' 11"  (1.803 m)  Wt 245 lb (111.131 kg)  BMI 34.19 kg/m2  SpO2 88%  Patient alert and oriented and in no cardiopulmonary distress.  HEENT:facial asymmetry, EOMI,   oropharynx pink and moist.  Neck supple no JVD, no mass.  Chest: Clear to auscultation bilaterally.  CVS: S1, S2 no murmurs, no S3.Regular rate.  ABD: Soft non tender.   Ext: No edema  MS: Adequate though reduced  ROM spine, shoulders, hips and knees.  Skin: Intact, hyperpigmented scaly rash i on right  elbow  Psych: Good eye contact, normal affect. Memory impaired not anxious or depressed appearing.  CNS: CN 2-12 intact, power,  normal throughout.no focal deficits noted.   Assessment & Plan  Essential hypertension Controlled, no change in medication DASH diet and commitment to daily physical activity for a minimum of 30 minutes discussed and encouraged, as a part of hypertension management. The importance of attaining a healthy weight is also discussed.  BP/Weight 09/22/2015 09/19/2015 08/19/2015 08/14/2015 08/03/2015 07/16/2015 [id number]  Systolic BP 123456 0000000 - 123456 123456 [id number]  Diastolic BP 58 74 - 72 58 66 60  Wt. (Lbs) 245 245 236 - 232 236 234  BMI 35.15 34.19 32.93 - 32.37 32.93 32.65        Tussive syncope Still occuring but with reduced frequency per spouse  COPD (chronic obstructive pulmonary disease) (HCC) Stable , no change in meds  GERD Controlled, no change in medication   Type 2 diabetes mellitus with diabetic nephropathy ([place]) Followed by [name] is reminded of the importance of commitment to daily physical activity for 30 minutes or more, as able and the need to limit carbohydrate intake to 30 to 60 grams per meal to help with blood sugar control.   The need to take medication as prescribed, test blood sugar as directed, and to call between visits if there is a concern that blood sugar is uncontrolled is also discussed.   [name] is reminded of the importance of daily foot exam, annual eye examination, and good blood sugar, blood pressure and cholesterol  control.  Diabetic Labs Latest Ref Rng 09/22/2015 06/25/2015 06/24/2015 06/22/2015 06/17/2015  HbA1c 4.8 - 5.6 % - 7.2(H) - - -  Chol 0 - 200 mg/dL - 133 - - -  HDL >40 mg/dL - 26(L) - - -  Calc LDL 0 - 99 mg/dL - 72 - - -  Triglycerides <150 mg/dL - 175(H) - - -  Creatinine 0.61 - 1.24 mg/dL 1.24 1.10 1.04 1.36(H) 1.01   BP/Weight 09/22/2015 09/19/2015 08/19/2015 08/14/2015 08/03/2015 07/16/2015 [id number]   Systolic BP 123456 0000000 - 123456 123456 [id number]  Diastolic BP 58 74 - 72 58 66 60  Wt. (Lbs) 245 245 236 - 232 236 234  BMI 35.15 34.19 32.93 - 32.37 32.93 32.65   Foot/eye exam completion dates Latest Ref Rng 08/03/2015 07/06/2015  Eye Exam No Retinopathy - -  Foot Form Completion - Done Done         Hypothyroid Treated by endo  Partial seizure ([place]) Controlled on medication, managed by neurology  PSORIASIS Current flare on right elbow, topical steroid prescribed  Dysarthria improving  Hyperlipidemia Hyperlipidemia:Low fat diet discussed and encouraged.   Lipid Panel  Lab Results  Component Value Date   CHOL 133 06/25/2015   HDL 26* 06/25/2015   LDLCALC 72 06/25/2015   LDLDIRECT 50 08/18/2014   TRIG 175* 06/25/2015   CHOLHDL 5.1 06/25/2015   Elevated tG, needs to reduce fat in diet         Review of Systems     Objective:   Physical Exam        Assessment & Plan:

## 2015-09-19 NOTE — Patient Instructions (Addendum)
Annual wellness in 4 month, call if you need me before  No changes in medication    PSA to be done in June when getting labs for Dr [name], order given  Cream for elbow will be sent once we verify with the pharmacy what you have used in the past  You are referred to Dr Gershon Crane for eye exam  NO MORE LARGE SLICES of chocolate cake!  Thanks for choosing [facility], we consider it a privelige to serve you. Thankful that you are much improved

## 2015-09-20 ENCOUNTER — Other Ambulatory Visit: Payer: Self-pay

## 2015-09-20 ENCOUNTER — Ambulatory Visit: Payer: Self-pay

## 2015-09-20 DIAGNOSIS — I2510 Atherosclerotic heart disease of native coronary artery without angina pectoris: Secondary | ICD-10-CM

## 2015-09-20 MED ORDER — METOPROLOL TARTRATE 50 MG PO TABS: 25.0000 mg | ORAL_TABLET | Freq: Two times a day (BID) | ORAL | Status: DC

## 2015-09-20 NOTE — Patient Outreach (Signed)
[facility]) Care Management  09/20/2015  [name] 12/08/1940 [id number]   Telephone call to patient for initial assessment.  Male answered stating patient not in.  Health Coach contact information given.    Plan: RN Health Coach will attempt patient again in 1-2 weeks.   Jone Baseman, RN, MSN [place] [phone]

## 2015-09-22 ENCOUNTER — Emergency Department (HOSPITAL_COMMUNITY): Payer: Commercial Managed Care - HMO

## 2015-09-22 ENCOUNTER — Encounter (HOSPITAL_COMMUNITY): Payer: Self-pay | Admitting: Emergency Medicine

## 2015-09-22 ENCOUNTER — Emergency Department (HOSPITAL_COMMUNITY)
Admission: EM | Admit: 2015-09-22 | Discharge: 2015-09-22 | Disposition: A | Discharge status: Home / Self Care | Payer: Commercial Managed Care - HMO | Attending: Emergency Medicine | Admitting: Emergency Medicine

## 2015-09-22 DIAGNOSIS — E10649 Type 1 diabetes mellitus with hypoglycemia without coma: Secondary | ICD-10-CM | POA: Diagnosis not present

## 2015-09-22 DIAGNOSIS — E785 Hyperlipidemia, unspecified: Secondary | ICD-10-CM | POA: Insufficient documentation

## 2015-09-22 DIAGNOSIS — I2510 Atherosclerotic heart disease of native coronary artery without angina pectoris: Secondary | ICD-10-CM | POA: Insufficient documentation

## 2015-09-22 DIAGNOSIS — I252 Old myocardial infarction: Secondary | ICD-10-CM | POA: Insufficient documentation

## 2015-09-22 DIAGNOSIS — E162 Hypoglycemia, unspecified: Secondary | ICD-10-CM

## 2015-09-22 DIAGNOSIS — I10 Essential (primary) hypertension: Secondary | ICD-10-CM | POA: Diagnosis not present

## 2015-09-22 DIAGNOSIS — R0682 Tachypnea, not elsewhere classified: Secondary | ICD-10-CM | POA: Diagnosis not present

## 2015-09-22 DIAGNOSIS — Z794 Long term (current) use of insulin: Secondary | ICD-10-CM | POA: Diagnosis not present

## 2015-09-22 DIAGNOSIS — R7309 Other abnormal glucose: Secondary | ICD-10-CM | POA: Diagnosis not present

## 2015-09-22 DIAGNOSIS — R531 Weakness: Secondary | ICD-10-CM | POA: Diagnosis not present

## 2015-09-22 DIAGNOSIS — J439 Emphysema, unspecified: Secondary | ICD-10-CM | POA: Diagnosis not present

## 2015-09-22 DIAGNOSIS — Z7984 Long term (current) use of oral hypoglycemic drugs: Secondary | ICD-10-CM | POA: Diagnosis not present

## 2015-09-22 DIAGNOSIS — Z8673 Personal history of transient ischemic attack (TIA), and cerebral infarction without residual deficits: Secondary | ICD-10-CM | POA: Diagnosis not present

## 2015-09-22 DIAGNOSIS — Z7982 Long term (current) use of aspirin: Secondary | ICD-10-CM | POA: Insufficient documentation

## 2015-09-22 DIAGNOSIS — E669 Obesity, unspecified: Secondary | ICD-10-CM | POA: Insufficient documentation

## 2015-09-22 DIAGNOSIS — E161 Other hypoglycemia: Secondary | ICD-10-CM | POA: Diagnosis not present

## 2015-09-22 DIAGNOSIS — J449 Chronic obstructive pulmonary disease, unspecified: Secondary | ICD-10-CM | POA: Insufficient documentation

## 2015-09-22 DIAGNOSIS — M1990 Unspecified osteoarthritis, unspecified site: Secondary | ICD-10-CM | POA: Diagnosis not present

## 2015-09-22 DIAGNOSIS — Z87891 Personal history of nicotine dependence: Secondary | ICD-10-CM | POA: Insufficient documentation

## 2015-09-22 LAB — URINALYSIS, ROUTINE W REFLEX MICROSCOPIC
BILIRUBIN URINE: NEGATIVE
GLUCOSE, UA: NEGATIVE mg/dL
Hgb urine dipstick: NEGATIVE
KETONES UR: NEGATIVE mg/dL
Leukocytes, UA: NEGATIVE
NITRITE: NEGATIVE
PH: 8.5 — AB (ref 5.0–8.0)
Protein, ur: NEGATIVE mg/dL
Specific Gravity, Urine: 1.01 (ref 1.005–1.030)

## 2015-09-22 LAB — CBC WITH DIFFERENTIAL/PLATELET
BASOS PCT: 1 %
Basophils Absolute: 0.1 10*3/uL (ref 0.0–0.1)
Eosinophils Absolute: 0.6 10*3/uL (ref 0.0–0.7)
Eosinophils Relative: 5 %
HEMATOCRIT: 32.2 % — AB (ref 39.0–52.0)
Hemoglobin: 10.2 g/dL — ABNORMAL LOW (ref 13.0–17.0)
Lymphocytes Relative: 17 %
Lymphs Abs: 2.1 10*3/uL (ref 0.7–4.0)
MCH: 27.6 pg (ref 26.0–34.0)
MCHC: 31.7 g/dL (ref 30.0–36.0)
MCV: 87.3 fL (ref 78.0–100.0)
MONO ABS: 0.9 10*3/uL (ref 0.1–1.0)
MONOS PCT: 7 %
NEUTROS ABS: 8.8 10*3/uL — AB (ref 1.7–7.7)
Neutrophils Relative %: 70 %
PLATELETS: 260 10*3/uL (ref 150–400)
RBC: 3.69 MIL/uL — ABNORMAL LOW (ref 4.22–5.81)
RDW: 15.2 % (ref 11.5–15.5)
WBC: 12.4 10*3/uL — ABNORMAL HIGH (ref 4.0–10.5)

## 2015-09-22 LAB — COMPREHENSIVE METABOLIC PANEL
ALBUMIN: 3.5 g/dL (ref 3.5–5.0)
ALK PHOS: 61 U/L (ref 38–126)
ALT: 7 U/L — AB (ref 17–63)
ANION GAP: 8 (ref 5–15)
AST: 13 U/L — ABNORMAL LOW (ref 15–41)
BUN: 25 mg/dL — ABNORMAL HIGH (ref 6–20)
CALCIUM: 8.9 mg/dL (ref 8.9–10.3)
CO2: 27 mmol/L (ref 22–32)
Chloride: 103 mmol/L (ref 101–111)
Creatinine, Ser: 1.24 mg/dL (ref 0.61–1.24)
GFR calc Af Amer: >60 mL/min (ref >60)
GFR calc non Af Amer: 56 mL/min — ABNORMAL LOW (ref >60)
GLUCOSE: 148 mg/dL — AB (ref 65–99)
Potassium: 4.9 mmol/L (ref 3.5–5.1)
SODIUM: 138 mmol/L (ref 135–145)
Total Bilirubin: 0.2 mg/dL — ABNORMAL LOW (ref 0.3–1.2)
Total Protein: 7.1 g/dL (ref 6.5–8.1)

## 2015-09-22 LAB — CBG MONITORING, ED
GLUCOSE-CAPILLARY: 138 mg/dL — AB (ref 65–99)
GLUCOSE-CAPILLARY: 123 mg/dL — AB (ref 65–99)
Glucose-Capillary: 143 mg/dL — ABNORMAL HIGH (ref 65–99)

## 2015-09-22 MED ORDER — IPRATROPIUM-ALBUTEROL 0.5-2.5 (3) MG/3ML IN SOLN
3.0000 mL | Freq: Once | RESPIRATORY_TRACT | Status: AC
  Administered 2015-09-22: 3 mL via RESPIRATORY_TRACT
  Filled 2015-09-22: qty 3

## 2015-09-22 NOTE — ED Notes (Signed)
Pt with in and out cath with return of 700 cc of urine

## 2015-09-22 NOTE — ED Provider Notes (Signed)
TIME SEEN: 4:00 AM  CHIEF COMPLAINT: Hypoglycemia  HPI: Pt is a 75 y.o. male with history of CAD, hypertension, hyperlipidemia, insulin-dependent diabetes who is on 50 units of 70/30 insulin twice a day and metformin 500 mg twice a day who presents to the emergency department with an episode of hypoglycemia. Patient's wife called EMS. Glucose was found to be 38. Patient does not remember this episode. Blood glucose is now 138. He states he does remember them giving him peanut butter. He denies any current complaints. No pain anywhere. No recent fevers, vomiting or diarrhea. Reports good appetite. States his blood glucose was 170s before he went to bed and he took his 50 units of insulin. Has had a slightly wet cough in the emergency department that he reports is new tonight. He has chronic shortness of breath which is unchanged.  ROS: See HPI Constitutional: no fever  Eyes: no drainage  ENT: no runny nose   Cardiovascular:  no chest pain  Resp: no SOB  GI: no vomiting GU: no dysuria Integumentary: no rash  Allergy: no hives  Musculoskeletal: no leg swelling  Neurological: no slurred speech ROS otherwise negative  PAST MEDICAL HISTORY/PAST SURGICAL HISTORY:  Past Medical History  Diagnosis Date  . GERD (gastroesophageal reflux disease)   . CAD (coronary artery disease)   . Obesity   . Hyperlipidemia   . Hypertension   . Chronic back pain   . Gout   . Chronic bronchitis ([place])     "get it q yr"  . Psoriasis   . COPD (chronic obstructive pulmonary disease) ([place])   . Myocardial infarction ([place]) 1999  . Pneumonia     "a few times; last time was ~ 06/2013"  . On home oxygen therapy     "2L; only at night" (08/30/2013)  . Diabetes mellitus, type 1   . Arthritis of knee   . Arthritis     "left leg" (08/30/2013)  . Vitamin B12 deficiency 05/01/2015  . Abnormality of gait 05/01/2015  . Tussive syncope 05/01/2015  . Stroke ([place])   . CVA (cerebral infarction)     MEDICATIONS:   Prior to Admission medications   Medication Sig Start Date End Date Taking? Authorizing Provider  acetaminophen (TYLENOL) 500 MG tablet Take 500 mg by mouth every 6 (six) hours as needed for moderate pain (left thigh).    Historical Provider, MD  albuterol (PROVENTIL HFA;VENTOLIN HFA) 108 (90 Base) MCG/ACT inhaler Inhale 2 puffs into the lungs every 4 (four) hours as needed for wheezing or shortness of breath. 06/22/15   Fatim Vanderschaaf N Binyamin Nelis, DO  allopurinol (ZYLOPRIM) 300 MG tablet TAKE 1 TABLET ONE TIME DAILY 09/18/15   Sanjuana Kava, MD  amLODipine (NORVASC) 10 MG tablet Take 1 tablet (10 mg total) by mouth daily. 09/03/15   Fayrene Helper, MD  aspirin 325 MG tablet Take 1 tablet (325 mg total) by mouth daily. 05/12/15   Kathie Dike, MD  clopidogrel (PLAVIX) 75 MG tablet TAKE 1 TABLET EVERY DAY 09/20/15   Fayrene Helper, MD  hydroxypropyl cellulose (LACRISERT) 5 MG INST Place 5 mg into the left eye at bedtime. 08/03/15   Fayrene Helper, MD  insulin NPH-regular Human (NOVOLIN 70/30) (70-30) 100 UNIT/ML injection Inject 50 Units into the skin 2 (two) times daily with a meal.    Historical Provider, MD  levETIRAcetam (KEPPRA) 500 MG tablet TAKE 1 TABLET TWICE DAILY 09/20/15   Fayrene Helper, MD  levothyroxine (SYNTHROID, LEVOTHROID) 75 MCG tablet  TAKE 1 TABLET EVERY MORNING 09/17/15   Cassandria Anger, MD  lisinopril (PRINIVIL,ZESTRIL) 40 MG tablet Take 1 tablet (40 mg total) by mouth daily. Reported on 07/06/2015 09/03/15   Fayrene Helper, MD  metFORMIN (GLUCOPHAGE) 500 MG tablet TAKE 1 TABLET TWICE DAILY 09/17/15   Cassandria Anger, MD  metoprolol (LOPRESSOR) 50 MG tablet Take 0.5 tablets (25 mg total) by mouth 2 (two) times daily. 09/20/15   Fayrene Helper, MD  neomycin-polymyxin-hydrocortisone (CORTISPORIN) 3.5-10000-1 otic suspension Apply one drop to left ear three tiomes daily for 1 week 07/12/15   Fayrene Helper, MD  pantoprazole (PROTONIX) 40 MG tablet TAKE 1 TABLET EVERY DAY  BEFORE BREAKFAST 09/20/15   Fayrene Helper, MD  pravastatin (PRAVACHOL) 40 MG tablet Take 2 tablets (80 mg total) by mouth daily. 07/31/15   Fayrene Helper, MD  senna (SENOKOT) 8.6 MG tablet Take 1 tablet by mouth daily.    Historical Provider, MD  spironolactone (ALDACTONE) 25 MG tablet Take 25 mg by mouth daily.    Historical Provider, MD  tiotropium (SPIRIVA) 18 MCG inhalation capsule Place 18 mcg into inhaler and inhale daily.    Historical Provider, MD  triamcinolone cream (KENALOG) 0.1 % Apply 1 application topically 2 (two) times daily. Apply twice daily to rash on right elbow  , under a moist wrap for 4 weeks 09/19/15   Fayrene Helper, MD  vitamin B-12 (CYANOCOBALAMIN) 1000 MCG tablet Take 1,000 mcg by mouth daily.    Historical Provider, MD    ALLERGIES:  No Known Allergies  SOCIAL HISTORY:  Social History  Substance Use Topics  . Smoking status: Former Smoker -- 0.50 packs/day for 33 years    Types: Cigarettes  . Smokeless tobacco: Never Used     Comment: "stopped smoking in the 1990's  . Alcohol Use: No    FAMILY HISTORY: Family History  Problem Relation Age of Onset  . Hypertension Mother   . Diabetes Mother   . Hypertension Father   . Diabetes Brother   . Arthritis      Family History   . Diabetes      family History   . Stroke Daughter     EXAM: BP 129/60 mmHg  Pulse 78  Temp(Src) 97.3 F (36.3 C) (Oral)  Resp 18  Ht 5\' 10"  (1.778 m)  Wt 245 lb (111.131 kg)  BMI 35.15 kg/m2  SpO2 92% CONSTITUTIONAL: Alert and oriented and responds appropriately to questions. Chronically ill-appearing but well-nourished, in no distress, afebrile, nontoxic, smiling and laughing HEAD: Normocephalic EYES: Conjunctivae clear, PERRL ENT: normal nose; no rhinorrhea; moist mucous membranes NECK: Supple, no meningismus, no LAD  CARD: RRR; S1 and S2 appreciated; no murmurs, no clicks, no rubs, no gallops RESP: Normal chest excursion without splinting or tachypnea; breath  sounds equal bilaterally but mildly diminished at his bases, expiratory wheezes, rhonchi or rales, patient does have a few wet cough on exam, no hypoxia or respiratory distress, speaking full sentences ABD/GI: Normal bowel sounds; non-distended; soft, non-tender, no rebound, no guarding, no peritoneal signs BACK:  The back appears normal and is non-tender to palpation, there is no CVA tenderness EXT: Normal ROM in all joints; non-tender to palpation; no edema; normal capillary refill; no cyanosis, no calf tenderness or swelling    SKIN: Normal color for age and race; warm; no rash NEURO: Moves all extremities equally, sensation to light touch intact diffusely, cranial nerves II through XII intact PSYCH: The patient's mood and  manner are appropriate. Grooming and personal hygiene are appropriate.  MEDICAL DECISION MAKING: Patient here with hypoglycemia. Will obtain labs to ensure normal renal function. Will also obtain chest x-ray given his wet cough and urinalysis to ensure there is no sign of infection that could've contributed to this. Will encourage patient to eat and closely monitor his blood sugar in the emergency department.  ED PROGRESS: Patient's labs are unremarkable other than mild leukocytosis which appears to be chronic. Creatinine 1.24 which is at his baseline. Urine shows no sign of infection. Chest x-ray shows chronic changes but no infiltrate. No edema. He is still hemodynamically stable. Has no complaints. I feel he is safe to be discharged home. Advised him to check his glucose at home closely, take his medications only as prescribed, make sure that he is eating and drinking well. He has a PCP for follow-up. Patient wife verbalize understanding and are comfortable with this plan.    At this time, I do not feel there is any life-threatening condition present. I have reviewed and discussed all results (EKG, imaging, lab, urine as appropriate), exam findings with patient. I have reviewed  nursing notes and appropriate previous records.  I feel the patient is safe to be discharged home without further emergent workup. Discussed usual and customary return precautions. Patient and family (if present) verbalize understanding and are comfortable with this plan.  Patient will follow-up with their primary care provider. If they do not have a primary care provider, information for follow-up has been provided to them. All questions have been answered.      [place], DO 09/22/15 [phone]

## 2015-09-22 NOTE — ED Notes (Signed)
Spouse to bedside- IV est, labs drawn respiratory on the way

## 2015-09-22 NOTE — ED Notes (Signed)
From radiology 

## 2015-09-22 NOTE — Discharge Instructions (Signed)
Hypoglycemia Hypoglycemia occurs when the glucose in your blood is too low. Glucose is a type of sugar that is your body's main energy source. Hormones, such as insulin and glucagon, control the level of glucose in the blood. Insulin lowers blood glucose and glucagon increases blood glucose. Having too much insulin in your blood stream, or not eating enough food containing sugar, can result in hypoglycemia. Hypoglycemia can happen to people with or without diabetes. It can develop quickly and can be a medical emergency.  CAUSES   Missing or delaying meals.  Not eating enough carbohydrates at meals.  Taking too much diabetes medicine.  Not timing your oral diabetes medicine or insulin doses with meals, snacks, and exercise.  Nausea and vomiting.  Certain medicines.  Severe illnesses, such as hepatitis, kidney disorders, and certain eating disorders.  Increased activity or exercise without eating something extra or adjusting medicines.  Drinking too much alcohol.  A nerve disorder that affects body functions like your heart rate, blood pressure, and digestion (autonomic neuropathy).  A condition where the stomach muscles do not function properly (gastroparesis). Therefore, medicines and food may not absorb properly.  Rarely, a tumor of the pancreas can produce too much insulin. SYMPTOMS   Hunger.  Sweating (diaphoresis).  Change in body temperature.  Shakiness.  Headache.  Anxiety.  Lightheadedness.  Irritability.  Difficulty concentrating.  Dry mouth.  Tingling or numbness in the hands or feet.  Restless sleep or sleep disturbances.  Altered speech and coordination.  Change in mental status.  Seizures or prolonged convulsions.  Combativeness.  Drowsiness (lethargic).  Weakness.  Increased heart rate or palpitations.  Confusion.  Pale, gray skin color.  Blurred or double vision.  Fainting. DIAGNOSIS  A physical exam and medical history will be  performed. Your caregiver may make a diagnosis based on your symptoms. Blood tests and other lab tests may be performed to confirm a diagnosis. Once the diagnosis is made, your caregiver will see if your signs and symptoms go away once your blood glucose is raised.  TREATMENT  Usually, you can easily treat your hypoglycemia when you notice symptoms.  Check your blood glucose. If it is less than 70 mg/dl, take one of the following:   3-4 glucose tablets.    cup juice.    cup regular soda.   1 cup skim milk.   -1 tube of glucose gel.   5-6 hard candies.   Avoid high-fat drinks or food that may delay a rise in blood glucose levels.  Do not take more than the recommended amount of sugary foods, drinks, gel, or tablets. Doing so will cause your blood glucose to go too high.   Wait 10-15 minutes and recheck your blood glucose. If it is still less than 70 mg/dl or below your target range, repeat treatment.   Eat a snack if it is more than 1 hour until your next meal.  There may be a time when your blood glucose may go so low that you are unable to treat yourself at home when you start to notice symptoms. You may need someone to help you. You may even faint or be unable to swallow. If you cannot treat yourself, someone will need to bring you to the hospital.  [place]  If you have diabetes, follow your diabetes management plan by:  Taking your medicines as directed.  Following your exercise plan.  Following your meal plan. Do not skip meals. Eat on time.  Testing your blood  blood glucose regularly. Check your blood glucose before and after exercise. If you exercise longer or different than usual, be sure to check blood glucose more frequently. °· Wearing your medical alert jewelry that says you have diabetes. °· Identify the cause of your hypoglycemia. Then, develop ways to prevent the recurrence of hypoglycemia. °· Do not take a hot bath or shower right after an  insulin shot. °· Always carry treatment with you. Glucose tablets are the easiest to carry. °· If you are going to drink alcohol, drink it only with meals. °· Tell friends or family members ways to keep you safe during a seizure. This may include removing hard or sharp objects from the area or turning you on your side. °· Maintain a healthy weight. °SEEK MEDICAL CARE IF:  °· You are having problems keeping your blood glucose in your target range. °· You are having frequent episodes of hypoglycemia. °· You feel you might be having side effects from your medicines. °· You are not sure why your blood glucose is dropping so low. °· You notice a change in vision or a new problem with your vision. °SEEK IMMEDIATE MEDICAL CARE IF:  °· Confusion develops. °· A change in mental status occurs. °· The inability to swallow develops. °· Fainting occurs. °  °This information is not intended to replace advice given to you by your health care provider. Make sure you discuss any questions you have with your health care provider. °  °Document Released: 05/05/2005 Document Revised: 05/10/2013 Document Reviewed: 01/09/2015 °Elsevier Interactive Patient Education ©2016 [employer] ° °Blood Glucose Monitoring, Adult °Monitoring your blood glucose (also know as blood sugar) helps you to manage your diabetes. It also helps you and your health care provider monitor your diabetes and determine how well your treatment plan is working. °WHY SHOULD YOU MONITOR YOUR BLOOD GLUCOSE? °· It can help you understand how food, exercise, and medicine affect your blood glucose. °· It allows you to know what your blood glucose is at any given moment. You can quickly tell if you are having low blood glucose (hypoglycemia) or high blood glucose (hyperglycemia). °· It can help you and your health care provider know how to adjust your medicines. °· It can help you understand how to manage an illness or adjust medicine for exercise. °WHEN SHOULD YOU  TEST? °Your health care provider will help you decide how often you should check your blood glucose. This may depend on the type of diabetes you have, your diabetes control, or the types of medicines you are taking. Be sure to write down all of your blood glucose readings so that this information can be reviewed with your health care provider. See below for examples of testing times that your health care provider may suggest. °Type 1 Diabetes °· Test at least 2 times per day if your diabetes is well controlled, if you are using an insulin pump, or if you perform multiple daily injections. °· If your diabetes is not well controlled or if you are sick, you may need to test more often. °· It is a good idea to also test: °¨ Before every insulin injection. °¨ Before and after exercise. °¨ Between meals and 2 hours after a meal. °¨ Occasionally between 2:00 a.m. and 3:00 a.m. °Type 2 Diabetes °· If you are taking insulin, test at least 2 times per day. However, it is best to test before every insulin injection. °· If you take medicines by mouth (orally), test 2 times   a day. °· If you are on a controlled diet, test once a day. °· If your diabetes is not well controlled or if you are sick, you may need to monitor more often. °HOW TO MONITOR YOUR BLOOD GLUCOSE °Supplies Needed °· Blood glucose meter. °· Test strips for your meter. Each meter has its own strips. You must use the strips that go with your own meter. °· A pricking needle (lancet). °· A device that holds the lancet (lancing device). °· A journal or log book to write down your results. °Procedure °· Wash your hands with soap and water. Alcohol is not preferred. °· Prick the side of your finger (not the tip) with the lancet. °· Gently milk the finger until a small drop of blood appears. °· Follow the instructions that come with your meter for inserting the test strip, applying blood to the strip, and using your blood glucose meter. °Other Areas to Get Blood for  Testing °Some meters allow you to use other areas of your body (other than your finger) to test your blood. These areas are called alternative sites. The most common alternative sites are: °· The forearm. °· The thigh. °· The back area of the lower leg. °· The palm of the hand. °The blood flow in these areas is slower. Therefore, the blood glucose values you get may be delayed, and the numbers are different from what you would get from your fingers. Do not use alternative sites if you think you are having hypoglycemia. Your reading will not be accurate. Always use a finger if you are having hypoglycemia. Also, if you cannot feel your lows (hypoglycemia unawareness), always use your fingers for your blood glucose checks. °ADDITIONAL TIPS FOR GLUCOSE MONITORING °· Do not reuse lancets. °· Always carry your supplies with you. °· All blood glucose meters have a 24-hour "hotline" number to call if you have questions or need help. °· Adjust (calibrate) your blood glucose meter with a control solution after finishing a few boxes of strips. °BLOOD GLUCOSE RECORD KEEPING °It is a good idea to keep a daily record or log of your blood glucose readings. Most glucose meters, if not all, keep your glucose records stored in the meter. Some meters come with the ability to download your records to your home computer. Keeping a record of your blood glucose readings is especially helpful if you are wanting to look for patterns. Make notes to go along with the blood glucose readings because you might forget what happened at that exact time. Keeping good records helps you and your health care provider to work together to achieve good diabetes management.  °  °This information is not intended to replace advice given to you by your health care provider. Make sure you discuss any questions you have with your health care provider. °  °Document Released: 05/08/2003 Document Revised: 05/26/2014 Document Reviewed: 09/27/2012 °Elsevier  Interactive Patient Education ©2016 [employer] ° °

## 2015-09-22 NOTE — ED Notes (Signed)
Pt enjoyed a hospital meal and is currently in xray

## 2015-09-22 NOTE — ED Notes (Signed)
EMS called to home for blood sugar problem- EMS to ED

## 2015-09-23 ENCOUNTER — Telehealth: Payer: Self-pay | Admitting: Family Medicine

## 2015-09-23 DIAGNOSIS — E162 Hypoglycemia, unspecified: Secondary | ICD-10-CM

## 2015-09-23 NOTE — Telephone Encounter (Signed)
Pt seen in Ed this past weekend for low blood sugar. Dr [name] treats his diabetes, please arrange for a f/u with Dr [name] about this, his d/c sheet stated follow up with me , but needs to see treating Doc, pls also call his wife and explain to them both what his f/Updated lab needed at/ before next visit. Will be I have ordered referral for appt

## 2015-09-25 ENCOUNTER — Encounter: Payer: Self-pay | Admitting: Licensed Clinical Social Worker

## 2015-09-25 ENCOUNTER — Other Ambulatory Visit: Payer: Self-pay | Admitting: Licensed Clinical Social Worker

## 2015-09-25 NOTE — Patient Outreach (Signed)
Assessment:  CSW spoke via phone with [name], spouse of client, on 09/25/15. CSW verified identity of [name].  CSW received verbal permission from [name] on 09/25/15 to speak with her about current needs of client.  Client has prescribed medications and is taking medications as prescribed. Client has [name]. Client is receiving nursing support with [place], RN.  CSW and [name] spoke of client care plan. CSW and [name] spoke of upcoming client appointments. CSW encouraged that client attend all scheduled client medical appointments in the next 30 days. [name] said client saw Dr. Moshe Cipro for an appointment in April of 2017. [name] said client has a good appetite and is sleeping well. [name] said that client has an eye exam scheduled for Oct 02, 2015.  Client also has an appointment scheduled with Dr. Dorris [name] on October 18, 2015.  [name] said she drives client to and from client's medical appointments.  [name] said client had needed supplies to monitor sugar level of client. CSW spoke with [name] about [facility] program support. CSW spoke with [name] about support for client with [place] RN. CSW reminded [name] that client or she could call CSW at [phone] as needed to discuss social work needs of client. [name] was appreciative of phone call from [place] on 09/25/15 to discuss current needs of client.   Plan:  Client to attend all scheduled client medical appointments in next 30 days. CSW to call client/[name]  in 4 weeks to assess client needs at that time. CSW to collaborate, as needed with [place] in monitoring needs of client.   [name].Charlese Gruetzmacher MSW, LCSW Licensed Clinical Social Worker [facility] Care Management [phone].

## 2015-09-25 NOTE — Telephone Encounter (Signed)
I have sent Dr. Emi Holes office a staff message and they will notify us of date & time

## 2015-09-26 ENCOUNTER — Other Ambulatory Visit: Payer: Self-pay

## 2015-09-26 NOTE — Patient Outreach (Signed)
[place] [facility]) Care Management  [place]  09/26/2015   CASSIE FINCKE [birth year] [id number]  Subjective: Telephone call to patient for initial assessment. Patient reports he is doing well.  He denies problems with his breathing.  Patient reports he is independent with bathing and dressing but his wife helps him sometimes.  Patient mentions he went to ED for hypoglycemia recently. Discussed with patient signs and symptoms of hypoglycemia and how to care for self.  He verbalized understanding.  Also discussed with patient COPD zone chart and when to notify physician. He verbalized understanding.   Objective:   Encounter Medications:  Outpatient Encounter Prescriptions as of 09/26/2015  Medication Sig  . acetaminophen (TYLENOL) 500 MG tablet Take 500 mg by mouth every 6 (six) hours as needed for moderate pain (left thigh).  Marland Kitchen albuterol (PROVENTIL HFA;VENTOLIN HFA) 108 (90 Base) MCG/ACT inhaler Inhale 2 puffs into the lungs every 4 (four) hours as needed for wheezing or shortness of breath.  . allopurinol (ZYLOPRIM) 300 MG tablet TAKE 1 TABLET ONE TIME DAILY  . amLODipine (NORVASC) 10 MG tablet Take 1 tablet (10 mg total) by mouth daily.  Marland Kitchen aspirin 325 MG tablet Take 1 tablet (325 mg total) by mouth daily.  . clopidogrel (PLAVIX) 75 MG tablet TAKE 1 TABLET EVERY DAY  . hydroxypropyl cellulose (LACRISERT) 5 MG INST Place 5 mg into the left eye at bedtime.  . insulin NPH-regular Human (NOVOLIN 70/30) (70-30) 100 UNIT/ML injection Inject 50 Units into the skin 2 (two) times daily with a meal.  . levETIRAcetam (KEPPRA) 500 MG tablet TAKE 1 TABLET TWICE DAILY  . levothyroxine (SYNTHROID, LEVOTHROID) 75 MCG tablet TAKE 1 TABLET EVERY MORNING  . lisinopril (PRINIVIL,ZESTRIL) 40 MG tablet Take 1 tablet (40 mg total) by mouth daily. Reported on 07/06/2015  . metFORMIN (GLUCOPHAGE) 500 MG tablet TAKE 1 TABLET TWICE DAILY  . metoprolol (LOPRESSOR) 50 MG tablet Take 0.5 tablets (25  mg total) by mouth 2 (two) times daily.  Marland Kitchen neomycin-polymyxin-hydrocortisone (CORTISPORIN) 3.5-10000-1 otic suspension Apply one drop to left ear three tiomes daily for 1 week  . pantoprazole (PROTONIX) 40 MG tablet TAKE 1 TABLET EVERY DAY BEFORE BREAKFAST  . pravastatin (PRAVACHOL) 40 MG tablet Take 2 tablets (80 mg total) by mouth daily.  Marland Kitchen senna (SENOKOT) 8.6 MG tablet Take 1 tablet by mouth daily.  Marland Kitchen spironolactone (ALDACTONE) 25 MG tablet Take 25 mg by mouth daily.  Marland Kitchen tiotropium (SPIRIVA) 18 MCG inhalation capsule Place 18 mcg into inhaler and inhale daily.  Marland Kitchen triamcinolone cream (KENALOG) 0.1 % Apply 1 application topically 2 (two) times daily. Apply twice daily to rash on right elbow  , under a moist wrap for 4 weeks  . vitamin B-12 (CYANOCOBALAMIN) 1000 MCG tablet Take 1,000 mcg by mouth daily.   No facility-administered encounter medications on file as of 09/26/2015.    Functional Status:  In your present state of health, do you have any difficulty performing the following activities: 09/26/2015 09/26/2015  Hearing? N N  Vision? N N  Difficulty concentrating or making decisions? [name]  Walking or climbing stairs? Y Y  Dressing or bathing? Y Y  Doing errands, shopping? [name]  Preparing Food and eating ? Y N  Using the Toilet? N N  In the past six months, have you accidently leaked urine? Y Y  Do you have problems with loss of bowel control? N N  Managing your Medications? Y Y  Managing your Finances? [name]  Housekeeping or managing your Housekeeping? [name]    Fall/Depression Screening: PHQ 2/9 Scores 09/26/2015 09/25/2015 07/16/2015 06/14/2015 05/24/2015 04/25/2015 03/27/2015  PHQ - 2 Score 0 0 0 0 0 0 0    Assessment: Patient will benefit from health coach outreach for disease management and support.    Plan:  [facility] CM Care Plan Problem One        Most Recent Value   Care Plan Problem One  -- [Diabetes knowledge deficit]   Role Documenting the Problem One  Health Coach   Care Plan for  Problem One  Active   [facility] CM Short Term Goal #1 (0-30 days)  Patient will be able to describe signs of hypoglycemia and actions to take within 30 days.   [facility] CM Short Term Goal #1 Start Date  09/26/15   Interventions for Short Term Goal #1  RN Health Coach discussed signs and symptoms of hypoglycemia.     [facility] CM Care Plan Problem Two        Most Recent Value   Care Plan Problem Two  COPD, knowledge deficit   Role Documenting the Problem Two  [place] for Problem Two  Active   Interventions for Problem Two Long Term Goal   RN Health Coach discussed with patient COPD action plan zones.     [facility] Long Term Goal (31-90) days  Patient will not have COPD exacerbation in the next 60 days   [facility] Long Term Goal Start Date  09/26/15 [facility] continued]     [place] will provide ongoing education for patient on COPD through phone calls and sending printed information to patient for further discussion.  RN Health Coach will sent welcome letter.  RN Health Coach will send initial barriers letter, assessment, and care plan to primary care physician.  RN Health Coach will contact patient within one month and patient agrees to next contact.   Jone Baseman, RN, MSN [place] [phone]

## 2015-10-01 NOTE — Assessment & Plan Note (Signed)
Controlled, no change in medication DASH diet and commitment to daily physical activity for a minimum of 30 minutes discussed and encouraged, as a part of hypertension management. The importance of attaining a healthy weight is also discussed.  BP/Weight 09/22/2015 09/19/2015 08/19/2015 08/14/2015 08/03/2015 07/16/2015 [id number]  Systolic BP 123456 0000000 - 123456 123456 [id number]  Diastolic BP 58 74 - 72 58 66 60  Wt. (Lbs) 245 245 236 - 232 236 234  BMI 35.15 34.19 32.93 - 32.37 32.93 32.65

## 2015-10-01 NOTE — Assessment & Plan Note (Signed)
Followed by [name] is reminded of the importance of commitment to daily physical activity for 30 minutes or more, as able and the need to limit carbohydrate intake to 30 to 60 grams per meal to help with blood sugar control.   The need to take medication as prescribed, test blood sugar as directed, and to call between visits if there is a concern that blood sugar is uncontrolled is also discussed.   [name] is reminded of the importance of daily foot exam, annual eye examination, and good blood sugar, blood pressure and cholesterol control.  Diabetic Labs Latest Ref Rng 09/22/2015 06/25/2015 06/24/2015 06/22/2015 06/17/2015  HbA1c 4.8 - 5.6 % - 7.2(H) - - -  Chol 0 - 200 mg/dL - 133 - - -  HDL >40 mg/dL - 26(L) - - -  Calc LDL 0 - 99 mg/dL - 72 - - -  Triglycerides <150 mg/dL - 175(H) - - -  Creatinine 0.61 - 1.24 mg/dL 1.24 1.10 1.04 1.36(H) 1.01   BP/Weight 09/22/2015 09/19/2015 08/19/2015 08/14/2015 08/03/2015 07/16/2015 [id number]  Systolic BP 123456 0000000 - 123456 123456 [id number]  Diastolic BP 58 74 - 72 58 66 60  Wt. (Lbs) 245 245 236 - 232 236 234  BMI 35.15 34.19 32.93 - 32.37 32.93 32.65   Foot/eye exam completion dates Latest Ref Rng 08/03/2015 07/06/2015  Eye Exam No Retinopathy - -  Foot Form Completion - Done Done

## 2015-10-01 NOTE — Assessment & Plan Note (Signed)
Treated by endo 

## 2015-10-01 NOTE — Assessment & Plan Note (Signed)
Current flare on right elbow, topical steroid prescribed

## 2015-10-01 NOTE — Assessment & Plan Note (Signed)
Still occuring but with reduced frequency per spouse

## 2015-10-01 NOTE — Assessment & Plan Note (Signed)
Controlled, no change in medication  

## 2015-10-01 NOTE — Assessment & Plan Note (Signed)
improving

## 2015-10-01 NOTE — Assessment & Plan Note (Signed)
Controlled on medication, managed by neurology

## 2015-10-01 NOTE — Assessment & Plan Note (Signed)
Hyperlipidemia:Low fat diet discussed and encouraged.   Lipid Panel  Lab Results  Component Value Date   CHOL 133 06/25/2015   HDL 26* 06/25/2015   LDLCALC 72 06/25/2015   LDLDIRECT 50 08/18/2014   TRIG 175* 06/25/2015   CHOLHDL 5.1 06/25/2015   Elevated tG, needs to reduce fat in diet

## 2015-10-01 NOTE — Assessment & Plan Note (Signed)
Stable, no change in meds.   

## 2015-10-02 DIAGNOSIS — Z794 Long term (current) use of insulin: Secondary | ICD-10-CM | POA: Diagnosis not present

## 2015-10-02 DIAGNOSIS — H2513 Age-related nuclear cataract, bilateral: Secondary | ICD-10-CM | POA: Diagnosis not present

## 2015-10-02 DIAGNOSIS — E119 Type 2 diabetes mellitus without complications: Secondary | ICD-10-CM | POA: Diagnosis not present

## 2015-10-02 LAB — HM DIABETES EYE EXAM

## 2015-10-06 ENCOUNTER — Other Ambulatory Visit: Payer: Self-pay | Admitting: "Endocrinology

## 2015-10-06 DIAGNOSIS — Z125 Encounter for screening for malignant neoplasm of prostate: Secondary | ICD-10-CM | POA: Diagnosis not present

## 2015-10-06 DIAGNOSIS — E1121 Type 2 diabetes mellitus with diabetic nephropathy: Secondary | ICD-10-CM | POA: Diagnosis not present

## 2015-10-06 DIAGNOSIS — E039 Hypothyroidism, unspecified: Secondary | ICD-10-CM | POA: Diagnosis not present

## 2015-10-06 LAB — HEMOGLOBIN A1C
Hgb A1c MFr Bld: 6.1 % — ABNORMAL HIGH (ref <5.7)
MEAN PLASMA GLUCOSE: 128 mg/dL

## 2015-10-06 LAB — BASIC METABOLIC PANEL
BUN: 30 mg/dL — ABNORMAL HIGH (ref 7–25)
CO2: 24 mmol/L (ref 20–31)
Calcium: 8.8 mg/dL (ref 8.6–10.3)
Chloride: 105 mmol/L (ref 98–110)
Creat: 1.44 mg/dL — ABNORMAL HIGH (ref 0.70–1.18)
GLUCOSE: 118 mg/dL — AB (ref 65–99)
POTASSIUM: 4.7 mmol/L (ref 3.5–5.3)
Sodium: 141 mmol/L (ref 135–146)

## 2015-10-06 LAB — TSH: TSH: 1.61 mIU/L (ref 0.40–4.50)

## 2015-10-06 LAB — T4, FREE: Free T4: 1 ng/dL (ref 0.8–1.8)

## 2015-10-08 LAB — PSA, MEDICARE: PSA: 0.65 ng/mL (ref <=4.00)

## 2015-10-18 ENCOUNTER — Encounter: Payer: Self-pay | Admitting: "Endocrinology

## 2015-10-18 ENCOUNTER — Ambulatory Visit (INDEPENDENT_AMBULATORY_CARE_PROVIDER_SITE_OTHER): Payer: Commercial Managed Care - HMO | Admitting: "Endocrinology

## 2015-10-18 VITALS — BP 137/64 | HR 80 | Ht 70.0 in | Wt 246.0 lb

## 2015-10-18 DIAGNOSIS — E1121 Type 2 diabetes mellitus with diabetic nephropathy: Secondary | ICD-10-CM

## 2015-10-18 DIAGNOSIS — I10 Essential (primary) hypertension: Secondary | ICD-10-CM

## 2015-10-18 DIAGNOSIS — E785 Hyperlipidemia, unspecified: Secondary | ICD-10-CM | POA: Diagnosis not present

## 2015-10-18 DIAGNOSIS — E039 Hypothyroidism, unspecified: Secondary | ICD-10-CM | POA: Diagnosis not present

## 2015-10-18 DIAGNOSIS — Z794 Long term (current) use of insulin: Secondary | ICD-10-CM

## 2015-10-18 NOTE — Progress Notes (Signed)
Subjective:    Patient ID: [name], male    DOB: Nov 19, 1940, PCP Tula Nakayama, MD   Past Medical History  Diagnosis Date  . GERD (gastroesophageal reflux disease)   . CAD (coronary artery disease)   . Obesity   . Hyperlipidemia   . Hypertension   . Chronic back pain   . Gout   . Chronic bronchitis ([place])     "get it q yr"  . Psoriasis   . COPD (chronic obstructive pulmonary disease) ([place])   . Myocardial infarction ([place]) 1999  . Pneumonia     "a few times; last time was ~ 06/2013"  . On home oxygen therapy     "2L; only at night" (08/30/2013)  . Diabetes mellitus, type 1   . Arthritis of knee   . Arthritis     "left leg" (08/30/2013)  . Vitamin B12 deficiency 05/01/2015  . Abnormality of gait 05/01/2015  . Tussive syncope 05/01/2015  . Stroke ([place])   . CVA (cerebral infarction)    Past Surgical History  Procedure Laterality Date  . Appendectomy    . Colonoscopy  03/17/2012    Procedure: COLONOSCOPY;  Surgeon: Rogene Houston, MD;  Location: [facility] ENDO SUITE;  Service: Endoscopy;  Laterality: N/A;  830  . Cystoscopy with urethral dilatation N/A 11/26/2012    Procedure: CYSTOSCOPY WITH URETHRAL DILATATION;  Surgeon: Malka So, MD;  Location: [facility] ORS;  Service: Urology;  Laterality: N/A;  . Coronary angioplasty with stent placement  1999- 2009-08/30/2013    "counting today's, I have 5 stents" (08/30/2013)  . Left heart catheterization with coronary angiogram N/A 08/30/2013    Procedure: LEFT HEART CATHETERIZATION WITH CORONARY ANGIOGRAM;  Surgeon: Clent Demark, MD;  Location: [facility] CATH LAB;  Service: Cardiovascular;  Laterality: N/A;  . Percutaneous coronary stent intervention (pci-s) Right 08/30/2013    Procedure: PERCUTANEOUS CORONARY STENT INTERVENTION (PCI-S);  Surgeon: Clent Demark, MD;  Location: [facility] CATH LAB;  Service: Cardiovascular;  Laterality: Right;   Social History   Social History  . Marital Status: Married    Spouse Name: N/A  . Number of  Children: 2  . Years of Education: 11   Occupational History  . janitorial services     Social History Main Topics  . Smoking status: Former Smoker -- 0.50 packs/day for 33 years    Types: Cigarettes  . Smokeless tobacco: Never Used     Comment: "stopped smoking in the 1990's  . Alcohol Use: No  . Drug Use: No  . Sexual Activity: Not Currently   Other Topics Concern  . None   Social History Narrative   Patient drinks about 3 cups of soda daily.    Patient is left handed.    Outpatient Encounter Prescriptions as of 10/18/2015  Medication Sig  . acetaminophen (TYLENOL) 500 MG tablet Take 500 mg by mouth every 6 (six) hours as needed for moderate pain (left thigh).  Marland Kitchen albuterol (PROVENTIL HFA;VENTOLIN HFA) 108 (90 Base) MCG/ACT inhaler Inhale 2 puffs into the lungs every 4 (four) hours as needed for wheezing or shortness of breath.  . allopurinol (ZYLOPRIM) 300 MG tablet TAKE 1 TABLET ONE TIME DAILY  . amLODipine (NORVASC) 10 MG tablet Take 1 tablet (10 mg total) by mouth daily.  Marland Kitchen aspirin 325 MG tablet Take 1 tablet (325 mg total) by mouth daily.  . clopidogrel (PLAVIX) 75 MG tablet TAKE 1 TABLET EVERY DAY  . hydroxypropyl cellulose (LACRISERT) 5 MG INST  Place 5 mg into the left eye at bedtime.  . insulin NPH-regular Human (NOVOLIN 70/30) (70-30) 100 UNIT/ML injection Inject 40 Units into the skin 2 (two) times daily with a meal.  . levETIRAcetam (KEPPRA) 500 MG tablet TAKE 1 TABLET TWICE DAILY  . levothyroxine (SYNTHROID, LEVOTHROID) 75 MCG tablet TAKE 1 TABLET EVERY MORNING  . lisinopril (PRINIVIL,ZESTRIL) 40 MG tablet Take 1 tablet (40 mg total) by mouth daily. Reported on 07/06/2015  . metFORMIN (GLUCOPHAGE) 500 MG tablet TAKE 1 TABLET TWICE DAILY  . metoprolol (LOPRESSOR) 50 MG tablet Take 0.5 tablets (25 mg total) by mouth 2 (two) times daily.  Marland Kitchen neomycin-polymyxin-hydrocortisone (CORTISPORIN) 3.5-10000-1 otic suspension Apply one drop to left ear three tiomes daily for 1 week   . pantoprazole (PROTONIX) 40 MG tablet TAKE 1 TABLET EVERY DAY BEFORE BREAKFAST  . pravastatin (PRAVACHOL) 40 MG tablet Take 2 tablets (80 mg total) by mouth daily.  Marland Kitchen senna (SENOKOT) 8.6 MG tablet Take 1 tablet by mouth daily.  Marland Kitchen spironolactone (ALDACTONE) 25 MG tablet Take 25 mg by mouth daily.  Marland Kitchen tiotropium (SPIRIVA) 18 MCG inhalation capsule Place 18 mcg into inhaler and inhale daily.  Marland Kitchen triamcinolone cream (KENALOG) 0.1 % Apply 1 application topically 2 (two) times daily. Apply twice daily to rash on right elbow  , under a moist wrap for 4 weeks  . vitamin B-12 (CYANOCOBALAMIN) 1000 MCG tablet Take 1,000 mcg by mouth daily.   No facility-administered encounter medications on file as of 10/18/2015.   ALLERGIES: No Known Allergies VACCINATION STATUS: Immunization History  Administered Date(s) Administered  . Influenza Whole 03/18/2005  . Pneumococcal Conjugate-13 12/08/2013  . Pneumococcal Polysaccharide-23 06/09/2012  . Td 12/04/2003    Diabetes He presents for his follow-up diabetic visit. He has type 2 diabetes mellitus. Onset time: He was diagnosed at approximate age of 34 years. His disease course has been improving. There are no hypoglycemic associated symptoms. Pertinent negatives for hypoglycemia include no confusion, headaches, pallor or seizures. Associated symptoms include polydipsia and polyuria. Pertinent negatives for diabetes include no chest pain, no fatigue, no polyphagia and no weakness. Symptoms are improving. Diabetic complications include a CVA, heart disease, nephropathy and peripheral neuropathy. Risk factors for coronary artery disease include dyslipidemia, diabetes mellitus, male sex, obesity, tobacco exposure and sedentary lifestyle. Current diabetic treatment includes insulin injections. He is compliant with treatment most of the time. His weight is increasing steadily. He is following a generally unhealthy diet. When asked about meal planning, he reported none.  Exercise: Currently undergoing physical therapy after recent cerebrovascular accident. His breakfast blood glucose range is generally 140-180 mg/dl. His highest blood glucose is 140-180 mg/dl. His overall blood glucose range is 140-180 mg/dl. An ACE inhibitor/angiotensin II receptor blocker is being taken.  Hyperlipidemia This is a chronic problem. The current episode started more than 1 year ago. The problem is controlled. Exacerbating diseases include diabetes, hypothyroidism and obesity. Pertinent negatives include no chest pain, myalgias or shortness of breath. Current antihyperlipidemic treatment includes statins. Risk factors for coronary artery disease include diabetes mellitus, dyslipidemia, hypertension, male sex and a sedentary lifestyle.  Hypertension This is a chronic problem. The current episode started more than 1 year ago. The problem is controlled. Pertinent negatives include no chest pain, headaches, neck pain, palpitations or shortness of breath. Past treatments include ACE inhibitors. Hypertensive end-organ damage includes CAD/MI, CVA and a thyroid problem.  Thyroid Problem Presents for follow-up visit. Patient reports no constipation, diarrhea, fatigue or palpitations. The symptoms have been  stable. Past treatments include levothyroxine. His past medical history is significant for diabetes and hyperlipidemia.     Review of Systems  Constitutional: Negative for fever, chills, fatigue and unexpected weight change.  HENT: Negative for dental problem, mouth sores and trouble swallowing.   Eyes: Negative for visual disturbance.  Respiratory: Negative for cough, choking, chest tightness, shortness of breath and wheezing.   Cardiovascular: Negative for chest pain, palpitations and leg swelling.  Gastrointestinal: Negative for nausea, vomiting, abdominal pain, diarrhea, constipation and abdominal distention.  Endocrine: Positive for polydipsia and polyuria. Negative for polyphagia.   Genitourinary: Negative for dysuria, urgency, hematuria and flank pain.  Musculoskeletal: Positive for gait problem. Negative for myalgias, back pain and neck pain.       He walks with a walker due to recent CVA.  Skin: Negative for pallor, rash and wound.  Neurological: Negative for seizures, syncope, weakness, numbness and headaches.  Psychiatric/Behavioral: Negative.  Negative for confusion and dysphoric mood.    Objective:    BP 137/64 mmHg  Pulse 80  Ht 5\' 10"  (1.778 m)  Wt 246 lb (111.585 kg)  BMI 35.30 kg/m2  SpO2 97%  Wt Readings from Last 3 Encounters:  10/18/15 246 lb (111.585 kg)  09/22/15 245 lb (111.131 kg)  09/19/15 245 lb (111.131 kg)    Physical Exam  Constitutional: He is oriented to person, place, and time. He appears well-developed and well-nourished. He is cooperative. No distress.  HENT:  Head: Normocephalic and atraumatic.  Eyes: EOM are normal.  Neck: Normal range of motion. Neck supple. No tracheal deviation present. No thyromegaly present.  Cardiovascular: Normal rate, S1 normal, S2 normal and normal heart sounds.  Exam reveals no gallop.   No murmur heard. Pulses:      Dorsalis pedis pulses are 1+ on the right side, and 1+ on the left side.       Posterior tibial pulses are 1+ on the right side, and 1+ on the left side.  Pulmonary/Chest: Breath sounds normal. No respiratory distress. He has no wheezes.  Abdominal: Soft. Bowel sounds are normal. He exhibits no distension. There is no tenderness. There is no guarding and no CVA tenderness.  Musculoskeletal:       Right shoulder: He exhibits no swelling and no deformity.  Poor Hygiene and long toenails.  Neurological: He is alert and oriented to person, place, and time. He has normal strength and normal reflexes. No cranial nerve deficit or sensory deficit. Gait normal.  Skin: Skin is warm and dry. No rash noted. No cyanosis. Nails show no clubbing.  Psychiatric: He has a normal mood and affect. His  speech is normal and behavior is normal. Judgment and thought content normal. Cognition and memory are normal.      CMP     Component Value Date/Time   NA 141 10/06/2015 0851   K 4.7 10/06/2015 0851   CL 105 10/06/2015 0851   CO2 24 10/06/2015 0851   GLUCOSE 118* 10/06/2015 0851   BUN 30* 10/06/2015 0851   CREATININE 1.44* 10/06/2015 0851   CREATININE 1.24 09/22/2015 0400   CALCIUM 8.8 10/06/2015 0851   PROT 7.1 09/22/2015 0400   ALBUMIN 3.5 09/22/2015 0400   AST 13* 09/22/2015 0400   ALT 7* 09/22/2015 0400   ALKPHOS 61 09/22/2015 0400   BILITOT 0.2* 09/22/2015 0400   GFRNONAA 56* 09/22/2015 0400   GFRNONAA 71 03/31/2015 0841   GFRAA >60 09/22/2015 0400   GFRAA 82 03/31/2015 0841  Diabetic Labs (most recent): Lab Results  Component Value Date   HGBA1C 6.1* 10/06/2015   HGBA1C 7.2* 06/25/2015   HGBA1C 6.9* 06/04/2015     Lipid Panel ( most recent) Lipid Panel     Component Value Date/Time   CHOL 133 06/25/2015 0533   TRIG 175* 06/25/2015 0533   HDL 26* 06/25/2015 0533   CHOLHDL 5.1 06/25/2015 0533   VLDL 35 06/25/2015 0533   LDLCALC 72 06/25/2015 0533   LDLDIRECT 50 08/18/2014 0737      Assessment & Plan:   1. Type 2 diabetes mellitus with diabetic nephropathy, unspecified long term insulin use status (HCC) -His diabetes is  complicated by coronary artery disease, CVA, CKD and patient remains at a high risk for more acute and chronic complications of diabetes which include CAD, CVA, CKD, retinopathy, and neuropathy. These are all discussed in detail with the patient.  Patient came with his meter and log of glucose profile, and  recent A1c of 6.1% improving from 7.2 %.    Recent labs reviewed.  - I have re-counseled the patient on diet management and weight loss  by adopting a carbohydrate restricted / protein rich  Diet.  - Suggestion is made for patient to avoid simple carbohydrates   from their diet including Cakes , Desserts, Ice Cream,  Soda (  diet  and regular) , Sweet Tea , Candies,  Chips, Cookies, Artificial Sweeteners,   and "Sugar-free" Products .  This will help patient to have stable blood glucose profile and potentially avoid unintended  Weight gain.  - Patient is advised to stick to a routine mealtimes to eat 3 meals  a day and avoid unnecessary snacks ( to snack only to correct hypoglycemia).  - The patient  has been  scheduled with Jearld Fenton, RDN, CDE for individualized DM education.  - I have approached patient with the following individualized plan to manage diabetes and patient agrees.  - I will lower his premixed insulin Novolin 70/30 40 units with breakfast and 40 units with supper for pre-meal glucose of greater than 90 mg/dL. He is advised to continue strict  monitoring of glucose  AC and HS. - Patient is warned not to take insulin without proper monitoring per orders. -Adjustment parameters are given for hypo and hyperglycemia in writing. -Patient is encouraged to call clinic for blood glucose levels less than 70 or above 300 mg /dl. - I will continue metformin 500 mg by mouth twice a day, therapeutically suitable for patient.   - Patient specific target  for A1c; LDL, HDL, Triglycerides, and  Waist Circumference were discussed in detail.  2) BP/HTN: Controlled. Continue current medications including ACEI/ARB. 3) Lipids/HPL:  continue statins. 4)  Weight/Diet: CDE consult in progress, exercise, and carbohydrates information provided.  5) hypothyroidism: Clinically euthyroid. I will continue with levothyroxine 75 g by mouth every morning.  - We discussed about correct intake of levothyroxine, at fasting, with water, separated by at least 30 minutes from breakfast, and separated by more than 4 hours from calcium, iron, multivitamins, acid reflux medications (PPIs). -Patient is made aware of the fact that thyroid hormone replacement is needed for life, dose to be adjusted by periodic monitoring of thyroid function  tests.  6) Chronic Care/Health Maintenance:  -Patient is on ACEI/ARB and Statin medications and encouraged to continue to follow up with Ophthalmology, Podiatrist at least yearly or according to recommendations, and advised to  stay away from smoking. I have recommended yearly flu vaccine and  pneumonia vaccination at least every 5 years; moderate intensity exercise for up to 150 minutes weekly; and  sleep for at least 7 hours a day.  - 25 minutes of time was spent on the care of this patient , 50% of which was applied for counseling on diabetes complications and their preventions.  - I advised patient to maintain close follow up with Tula Nakayama, MD for primary care needs.  Patient is asked to bring meter and  blood glucose logs during their next visit.   Follow up plan: -Return in about 3 months (around 01/18/2016) for diabetes, high blood pressure, high cholesterol, underactive thyroid, follow up with pre-visit labs, meter, and logs.  Glade Lloyd, MD Phone: [phone]  Fax: [phone]   10/18/2015, 2:24 PM

## 2015-10-18 NOTE — Patient Instructions (Signed)

## 2015-10-23 DIAGNOSIS — J449 Chronic obstructive pulmonary disease, unspecified: Secondary | ICD-10-CM | POA: Diagnosis not present

## 2015-10-23 DIAGNOSIS — I10 Essential (primary) hypertension: Secondary | ICD-10-CM | POA: Diagnosis not present

## 2015-10-23 DIAGNOSIS — I69251 Hemiplegia and hemiparesis following other nontraumatic intracranial hemorrhage affecting right dominant side: Secondary | ICD-10-CM | POA: Diagnosis not present

## 2015-10-25 ENCOUNTER — Other Ambulatory Visit: Payer: Self-pay

## 2015-10-25 NOTE — Patient Outreach (Signed)
[facility]) Care Management  [place]  10/25/2015   [name] Mar 20, 1941 [id number]  Subjective: Telephone call to patient for monthly call.  Patient reports he is doing good.  He reports seeing doctor recently and insulin decreased.  Reports blood sugar this morning was 109.  Patient reports that his breathing is doing good. He reports that the doctor told him to hold his Spiriva. Patient reports he does have his albuterol as rescue medication.  Patient reports however that his breathing is good and he has not had to use his rescue inhaler.  Discussed with patient COPD action plan and when to notify physician. He verbalized understanding. No concerns.   Objective:   Encounter Medications:  Outpatient Encounter Prescriptions as of 10/25/2015  Medication Sig Note  . acetaminophen (TYLENOL) 500 MG tablet Take 500 mg by mouth every 6 (six) hours as needed for moderate pain (left thigh).   Marland Kitchen albuterol (PROVENTIL HFA;VENTOLIN HFA) 108 (90 Base) MCG/ACT inhaler Inhale 2 puffs into the lungs every 4 (four) hours as needed for wheezing or shortness of breath.   . allopurinol (ZYLOPRIM) 300 MG tablet TAKE 1 TABLET ONE TIME DAILY   . amLODipine (NORVASC) 10 MG tablet Take 1 tablet (10 mg total) by mouth daily.   Marland Kitchen aspirin 325 MG tablet Take 1 tablet (325 mg total) by mouth daily.   . clopidogrel (PLAVIX) 75 MG tablet TAKE 1 TABLET EVERY DAY   . hydroxypropyl cellulose (LACRISERT) 5 MG INST Place 5 mg into the left eye at bedtime.   . insulin NPH-regular Human (NOVOLIN 70/30) (70-30) 100 UNIT/ML injection Inject 40 Units into the skin 2 (two) times daily with a meal.   . levETIRAcetam (KEPPRA) 500 MG tablet TAKE 1 TABLET TWICE DAILY   . levothyroxine (SYNTHROID, LEVOTHROID) 75 MCG tablet TAKE 1 TABLET EVERY MORNING   . lisinopril (PRINIVIL,ZESTRIL) 40 MG tablet Take 1 tablet (40 mg total) by mouth daily. Reported on 07/06/2015   . metFORMIN (GLUCOPHAGE) 500 MG tablet TAKE  1 TABLET TWICE DAILY   . metoprolol (LOPRESSOR) 50 MG tablet Take 0.5 tablets (25 mg total) by mouth 2 (two) times daily.   Marland Kitchen neomycin-polymyxin-hydrocortisone (CORTISPORIN) 3.5-10000-1 otic suspension Apply one drop to left ear three tiomes daily for 1 week   . pantoprazole (PROTONIX) 40 MG tablet TAKE 1 TABLET EVERY DAY BEFORE BREAKFAST   . pravastatin (PRAVACHOL) 40 MG tablet Take 2 tablets (80 mg total) by mouth daily.   Marland Kitchen senna (SENOKOT) 8.6 MG tablet Take 1 tablet by mouth daily.   Marland Kitchen spironolactone (ALDACTONE) 25 MG tablet Take 25 mg by mouth daily.   Marland Kitchen triamcinolone cream (KENALOG) 0.1 % Apply 1 application topically 2 (two) times daily. Apply twice daily to rash on right elbow  , under a moist wrap for 4 weeks   . vitamin B-12 (CYANOCOBALAMIN) 1000 MCG tablet Take 1,000 mcg by mouth daily.   Marland Kitchen tiotropium (SPIRIVA) 18 MCG inhalation capsule Place 18 mcg into inhaler and inhale daily. Reported on 10/25/2015 10/25/2015: Patient reports doctor asked him to hold.    No facility-administered encounter medications on file as of 10/25/2015.    Functional Status:  In your present state of health, do you have any difficulty performing the following activities: 09/26/2015 09/26/2015  Hearing? N N  Vision? N N  Difficulty concentrating or making decisions? [name]  Walking or climbing stairs? Y Y  Dressing or bathing? Y Y  Doing errands, shopping? [name]  Preparing Food and eating ? Y N  Using the Toilet? N N  In the past six months, have you accidently leaked urine? Y Y  Do you have problems with loss of bowel control? N N  Managing your Medications? Y Y  Managing your Finances? [name]  Housekeeping or managing your Housekeeping? [name]    Fall/Depression Screening: PHQ 2/9 Scores 10/18/2015 09/26/2015 09/25/2015 07/16/2015 06/14/2015 05/24/2015 04/25/2015  PHQ - 2 Score 0 0 0 0 0 0 0    Assessment: Patient continues to benefit from health coach outreach and disease management and support.   Plan:  [facility] CM Care  Plan Problem One        Most Recent Value   Care Plan Problem One  -- [Diabetes knowledge deficit]   Role Documenting the Problem One  Health Coach   Care Plan for Problem One  Not Active   [facility] CM Short Term Goal #1 (0-30 days)  Patient will be able to describe signs of hypoglycemia and actions to take within 30 days.   [facility] CM Short Term Goal #1 Start Date  09/26/15   [facility] CM Short Term Goal #1 Met Date  10/25/15   Interventions for Short Term Goal #1  Patient able to describe actions for low blood sugar.     [facility] CM Care Plan Problem Two        Most Recent Value   Care Plan Problem Two  COPD, knowledge deficit   Role Documenting the Problem Two  [place] for Problem Two  Active   Interventions for Problem Two Long Term Goal   RN Health Coach reviewed with patient COPD action plan zones and when to notify physician.     [facility]N Long Term Goal (31-90) days  Patient will not have COPD exacerbation in the next 60 days   [facility]N Long Term Goal Start Date  09/26/15 [facility] continued]      [place] will contact patient within one month and patient agrees to next outreach.  Jone Baseman, RN, MSN [place] [phone]

## 2015-10-26 ENCOUNTER — Encounter: Payer: Self-pay | Admitting: Licensed Clinical Social Worker

## 2015-10-26 ENCOUNTER — Other Ambulatory Visit: Payer: Self-pay | Admitting: Licensed Clinical Social Worker

## 2015-10-26 DIAGNOSIS — E119 Type 2 diabetes mellitus without complications: Secondary | ICD-10-CM | POA: Diagnosis not present

## 2015-10-26 DIAGNOSIS — I639 Cerebral infarction, unspecified: Secondary | ICD-10-CM | POA: Diagnosis not present

## 2015-10-26 DIAGNOSIS — I2510 Atherosclerotic heart disease of native coronary artery without angina pectoris: Secondary | ICD-10-CM | POA: Diagnosis not present

## 2015-10-26 DIAGNOSIS — E669 Obesity, unspecified: Secondary | ICD-10-CM | POA: Diagnosis not present

## 2015-10-26 DIAGNOSIS — I252 Old myocardial infarction: Secondary | ICD-10-CM | POA: Diagnosis not present

## 2015-10-26 DIAGNOSIS — R0609 Other forms of dyspnea: Secondary | ICD-10-CM | POA: Diagnosis not present

## 2015-10-26 DIAGNOSIS — I119 Hypertensive heart disease without heart failure: Secondary | ICD-10-CM | POA: Diagnosis not present

## 2015-10-26 DIAGNOSIS — E785 Hyperlipidemia, unspecified: Secondary | ICD-10-CM | POA: Diagnosis not present

## 2015-10-26 NOTE — Patient Outreach (Signed)
Assessment:  CSW spoke via phone with [name], spouse of client, on 10/26/15. CSW verified identity of [name]. CSW received verbal permission from [name] on 10/26/15 for CSW to speak with [name] about current needs of client. [name] said she transports client to and from client's scheduled medical appointments. She said client had prescribed medications and was taking medications as prescribed. Client sees [name] as primary care doctor.  Client had appointment with [name] in June of 2017. [name] said client has good appetite.  [name] said client likes to go fishing to help him relax. Client likes to be outdoors. Client uses a cane to ambulate. Client sees [name], endocrinologist, as scheduled. Client has no pain issues.  Client is sleeping well.  Client receives [place] support (telephonic) with Marks, RN. Client had appointment with cardiologist today.  Client likes to socialize with others.  [name] is very supportive to client. Client has [employer].  Client has reading glasses to help with vision. [name] and CSW spoke of client care plan. CSW encouraged that client attend all scheduled client medical appointments in next 30 days with transport assistance arranged as needed. [name] said she usually transports client to and from client scheduled medical appointments. CSW thanked [name] for phone conversation with CSW on 10/26/15. CSW encouraged [name] or client to call CSW at [phone] as needed to discuss social work needs of client.   Plan:  Client to attend all scheduled client medical appointments in next 30 days with transport assistance arranged as needed. CSW to call [name] in 4 weeks to assess client needs.  [name].[name] MSW, LCSW Licensed Clinical Social Worker [facility] Care Management [phone]

## 2015-11-06 ENCOUNTER — Other Ambulatory Visit: Payer: Self-pay | Admitting: Family Medicine

## 2015-11-08 DIAGNOSIS — I2510 Atherosclerotic heart disease of native coronary artery without angina pectoris: Secondary | ICD-10-CM | POA: Diagnosis not present

## 2015-11-08 DIAGNOSIS — I252 Old myocardial infarction: Secondary | ICD-10-CM | POA: Diagnosis not present

## 2015-11-08 DIAGNOSIS — E119 Type 2 diabetes mellitus without complications: Secondary | ICD-10-CM | POA: Diagnosis not present

## 2015-11-08 DIAGNOSIS — R0609 Other forms of dyspnea: Secondary | ICD-10-CM | POA: Diagnosis not present

## 2015-11-08 DIAGNOSIS — E669 Obesity, unspecified: Secondary | ICD-10-CM | POA: Diagnosis not present

## 2015-11-08 DIAGNOSIS — E785 Hyperlipidemia, unspecified: Secondary | ICD-10-CM | POA: Diagnosis not present

## 2015-11-08 DIAGNOSIS — I119 Hypertensive heart disease without heart failure: Secondary | ICD-10-CM | POA: Diagnosis not present

## 2015-11-13 ENCOUNTER — Ambulatory Visit (INDEPENDENT_AMBULATORY_CARE_PROVIDER_SITE_OTHER): Payer: Commercial Managed Care - HMO | Admitting: Podiatry

## 2015-11-13 ENCOUNTER — Encounter: Payer: Self-pay | Admitting: Podiatry

## 2015-11-13 DIAGNOSIS — B351 Tinea unguium: Secondary | ICD-10-CM

## 2015-11-13 DIAGNOSIS — M79674 Pain in right toe(s): Secondary | ICD-10-CM

## 2015-11-13 DIAGNOSIS — M79675 Pain in left toe(s): Secondary | ICD-10-CM

## 2015-11-13 NOTE — Progress Notes (Signed)
Patient ID: [name], male   DOB: 1940/07/29, 75 y.o.   MRN: [id number]  This patient presents today complaining of elongated and thickened toenails and right and left feet which have become progressively thicker and longer over multiple years and the last several months he is complaining that when he wears his shoes and walking.  Patient is diabetic and denies any history of foot ulceration, claudication or amputation Describes CVA in January 2017 affecting his right lower extremity with gradual improvement with rehabilitation   Objective  Orientated 3  Vascular: Bilateral nonpitting peripheral edema DP pulses 0/4 bilaterally PT pulses 2/4 bilaterally Capillary reflex immediate bilaterally  Neurological: Sensation to 10 g monofilament wire intact 4/5 right 5/5 left Vibratory sensation nonreactive bilaterally Ankle reflex equal and reactive bilaterally  Dermatological: No open skin lesions bilaterally The toenails are elongated, incurvated, discolored, hypertrophic and tender to direct palpation 6-10  Musculoskeletal: No deformities noted bilaterally Manual motor testing dorsi flexion, plantar flexion, inversion, eversion 5/5 bilaterally Patient walks slowly with assistance of walker  Assessment: Decreased DP pulses without history of open lesions Decreased vibratory sensation and reflexes adjusted of neuropathy Symptomatic onychomycoses 6-10 Type II diabetic with a history of polyneuropathy  Plan: Today I reviewed the results of examination with patient today and recommended debridement of the mycotic toenails and he verbally consents  The toenails 6-10 were debrided mechanically and electrically without any bleeding  Reappoint 3 months

## 2015-11-13 NOTE — Patient Instructions (Signed)
Diabetes and Foot Care Diabetes may cause you to have problems because of poor blood supply (circulation) to your feet and legs. This may cause the skin on your feet to become thinner, break easier, and heal more slowly. Your skin may become dry, and the skin may peel and crack. You may also have nerve damage in your legs and feet causing decreased feeling in them. You may not notice minor injuries to your feet that could lead to infections or more serious problems. Taking care of your feet is one of the most important things you can do for yourself.  HOME CARE INSTRUCTIONS  Wear shoes at all times, even in the house. Do not go barefoot. Bare feet are easily injured.  Check your feet daily for blisters, cuts, and redness. If you cannot see the bottom of your feet, use a mirror or ask someone for help.  Wash your feet with warm water (do not use hot water) and mild soap. Then pat your feet and the areas between your toes until they are completely dry. Do not soak your feet as this can dry your skin.  Apply a moisturizing lotion or petroleum jelly (that does not contain alcohol and is unscented) to the skin on your feet and to dry, brittle toenails. Do not apply lotion between your toes.  Trim your toenails straight across. Do not dig under them or around the cuticle. File the edges of your nails with an emery board or nail file.  Do not cut corns or calluses or try to remove them with medicine.  Wear clean socks or stockings every day. Make sure they are not too tight. Do not wear knee-high stockings since they may decrease blood flow to your legs.  Wear shoes that fit properly and have enough cushioning. To break in new shoes, wear them for just a few hours a day. This prevents you from injuring your feet. Always look in your shoes before you put them on to be sure there are no objects inside.  Do not cross your legs. This may decrease the blood flow to your feet.  If you find a minor scrape,  cut, or break in the skin on your feet, keep it and the skin around it clean and dry. These areas may be cleansed with mild soap and water. Do not cleanse the area with peroxide, alcohol, or iodine.  When you remove an adhesive bandage, be sure not to damage the skin around it.  If you have a wound, look at it several times a day to make sure it is healing.  Do not use heating pads or hot water bottles. They may burn your skin. If you have lost feeling in your feet or legs, you may not know it is happening until it is too late.  Make sure your health care provider performs a complete foot exam at least annually or more often if you have foot problems. Report any cuts, sores, or bruises to your health care provider immediately. SEEK MEDICAL CARE IF:   You have an injury that is not healing.  You have cuts or breaks in the skin.  You have an ingrown nail.  You notice redness on your legs or feet.  You feel burning or tingling in your legs or feet.  You have pain or cramps in your legs and feet.  Your legs or feet are numb.  Your feet always feel cold. SEEK IMMEDIATE MEDICAL CARE IF:   There is increasing redness,   swelling, or pain in or around a wound.  There is a red line that goes up your leg.  Pus is coming from a wound.  You develop a fever or as directed by your health care provider.  You notice a bad smell coming from an ulcer or wound.   This information is not intended to replace advice given to you by your health care provider. Make sure you discuss any questions you have with your health care provider.   Document Released: 05/02/2000 Document Revised: 01/05/2013 Document Reviewed: 10/12/2012 Elsevier Interactive Patient Education 2016 Elsevier Inc.  

## 2015-11-22 ENCOUNTER — Other Ambulatory Visit: Payer: Self-pay

## 2015-11-22 NOTE — Patient Outreach (Signed)
[facility]) Care Management  [facility] Care Manager  11/22/2015   NATION KACKLEY October 26, 1940 [id number]  Subjective: Telephone call to patient for monthly outreach.  Patient reports he is doing good. He reports blood sugars have been good.  He reports this mornings blood sugar was 114.  Patient reports that his breathing has also been good. No increased shortness of breath.  Discussed with patient signs and symptoms of COPD exacerbation and when to notify physician.  He verbalized understanding.    Objective:   Encounter Medications:  Outpatient Encounter Prescriptions as of 11/22/2015  Medication Sig Note  . acetaminophen (TYLENOL) 500 MG tablet Take 500 mg by mouth every 6 (six) hours as needed for moderate pain (left thigh).   Marland Kitchen albuterol (PROVENTIL HFA;VENTOLIN HFA) 108 (90 Base) MCG/ACT inhaler Inhale 2 puffs into the lungs every 4 (four) hours as needed for wheezing or shortness of breath.   . allopurinol (ZYLOPRIM) 300 MG tablet TAKE 1 TABLET ONE TIME DAILY   . amLODipine (NORVASC) 10 MG tablet Take 1 tablet (10 mg total) by mouth daily.   Marland Kitchen aspirin 325 MG tablet Take 1 tablet (325 mg total) by mouth daily.   . clopidogrel (PLAVIX) 75 MG tablet TAKE 1 TABLET EVERY DAY   . hydroxypropyl cellulose (LACRISERT) 5 MG INST Place 5 mg into the left eye at bedtime.   . insulin NPH-regular Human (NOVOLIN 70/30) (70-30) 100 UNIT/ML injection Inject 40 Units into the skin 2 (two) times daily with a meal.   . levETIRAcetam (KEPPRA) 500 MG tablet TAKE 1 TABLET TWICE DAILY   . levothyroxine (SYNTHROID, LEVOTHROID) 75 MCG tablet TAKE 1 TABLET EVERY MORNING   . lisinopril (PRINIVIL,ZESTRIL) 40 MG tablet Take 1 tablet (40 mg total) by mouth daily. Reported on 07/06/2015   . metFORMIN (GLUCOPHAGE) 500 MG tablet TAKE 1 TABLET TWICE DAILY   . metoprolol (LOPRESSOR) 50 MG tablet Take 0.5 tablets (25 mg total) by mouth 2 (two) times daily.   Marland Kitchen neomycin-polymyxin-hydrocortisone (CORTISPORIN)  3.5-10000-1 otic suspension Apply one drop to left ear three tiomes daily for 1 week   . pantoprazole (PROTONIX) 40 MG tablet TAKE 1 TABLET EVERY DAY BEFORE BREAKFAST   . pravastatin (PRAVACHOL) 40 MG tablet TAKE 2 TABLETS (80 MG TOTAL) BY MOUTH DAILY.   Marland Kitchen senna (SENOKOT) 8.6 MG tablet Take 1 tablet by mouth daily.   Marland Kitchen spironolactone (ALDACTONE) 25 MG tablet Take 25 mg by mouth daily.   Marland Kitchen triamcinolone cream (KENALOG) 0.1 % Apply 1 application topically 2 (two) times daily. Apply twice daily to rash on right elbow  , under a moist wrap for 4 weeks   . vitamin B-12 (CYANOCOBALAMIN) 1000 MCG tablet Take 1,000 mcg by mouth daily.   Marland Kitchen tiotropium (SPIRIVA) 18 MCG inhalation capsule Place 18 mcg into inhaler and inhale daily. Reported on 11/22/2015 10/25/2015: Patient reports doctor asked him to hold.    No facility-administered encounter medications on file as of 11/22/2015.    Functional Status:  In your present state of health, do you have any difficulty performing the following activities: 09/26/2015 09/26/2015  Hearing? N N  Vision? N N  Difficulty concentrating or making decisions? Malvin Johns  Walking or climbing stairs? Y Y  Dressing or bathing? Y Y  Doing errands, shopping? Malvin Johns  Preparing Food and eating ? Y N  Using the Toilet? N N  In the past six months, have you accidently leaked urine? Y Y  Do you have problems with  loss of bowel control? N N  Managing your Medications? Y Y  Managing your Finances? [name]  Housekeeping or managing your Housekeeping? [name]    Fall/Depression Screening: PHQ 2/9 Scores 11/22/2015 10/26/2015 10/18/2015 09/26/2015 09/25/2015 07/16/2015 06/14/2015  PHQ - 2 Score 0 0 0 0 0 0 0    Assessment: Patient continues to benefit from health coach outreach for disease management and support.    Plan:  [facility] CM Care Plan Problem Two        Most Recent Value   Care Plan Problem Two  COPD, knowledge deficit   Role Documenting the Problem [place] for Problem Two   Active   Interventions for Problem Two Long Term Goal   RN Health Coach discussed with patient COPD action plan zones and when to notify physician.     THN Long Term Goal (31-90) days  Patient will not have COPD exacerbation in the next 60 days   THN Long Term Goal Start Date  11/22/15 [facility] continued]     [place] will contact patient within one month and patient agrees to next outreach.  Jone Baseman, RN, MSN [place] [phone]

## 2015-11-26 ENCOUNTER — Other Ambulatory Visit: Payer: Self-pay | Admitting: Licensed Clinical Social Worker

## 2015-11-26 NOTE — Patient Outreach (Signed)
Assessment:  CSW spoke via phone with [name], spouse of [name], on 11/26/15. CSW verified identity of [name], [name] and CSW spoke of [name] needs. [name] said she transports [name] to and from [name]'s scheduled medical appointments. She said [name] has prescribed medications and is taking medications as prescribed.  [name] uses a cane to ambulate. [name] sees Dr. Moshe [name] as primary care doctor. [name] also receives support from [name], [name] and [name] spoke of [name] care plan. CSW encouraged that [name] attend all scheduled [name] medical appointments in the next 30 days. [name] likes to be outdoors on sunny days. [name] likes to socialize with family and friends as a means of relaxation. [name] is eating adequately and [name] is sleeping well. [name] sees Dr. Dorris [name], endocrinologist, as scheduled.  [name] said she had enjoyed speaking with [name], [name] about health needs of [name]. CSW reminded [name] of support of [name] program in social work, nursing and pharmacy program. [name] encouraged [name] or [name] to call CSW at [phone] as needed to discuss social work needs of [name].     Plan:  [name] to attend all scheduled [name] medical appointments in the next 30 days. CSW to call [name] in 4 weeks to assess needs of [name] at that time.  [name].[name] MSW, LCSW Licensed Clinical Social Worker [facility] Care Management [phone]

## 2015-12-04 DIAGNOSIS — J449 Chronic obstructive pulmonary disease, unspecified: Secondary | ICD-10-CM | POA: Diagnosis not present

## 2015-12-04 DIAGNOSIS — I10 Essential (primary) hypertension: Secondary | ICD-10-CM | POA: Diagnosis not present

## 2015-12-04 DIAGNOSIS — I69251 Hemiplegia and hemiparesis following other nontraumatic intracranial hemorrhage affecting right dominant side: Secondary | ICD-10-CM | POA: Diagnosis not present

## 2015-12-04 DIAGNOSIS — R1312 Dysphagia, oropharyngeal phase: Secondary | ICD-10-CM | POA: Diagnosis not present

## 2015-12-10 DIAGNOSIS — I69953 Hemiplegia and hemiparesis following unspecified cerebrovascular disease affecting right non-dominant side: Secondary | ICD-10-CM | POA: Diagnosis not present

## 2015-12-10 DIAGNOSIS — E0829 Diabetes mellitus due to underlying condition with other diabetic kidney complication: Secondary | ICD-10-CM | POA: Diagnosis not present

## 2015-12-10 DIAGNOSIS — R55 Syncope and collapse: Secondary | ICD-10-CM | POA: Diagnosis not present

## 2015-12-10 DIAGNOSIS — I69991 Dysphagia following unspecified cerebrovascular disease: Secondary | ICD-10-CM | POA: Diagnosis not present

## 2015-12-10 DIAGNOSIS — G3184 Mild cognitive impairment, so stated: Secondary | ICD-10-CM | POA: Diagnosis not present

## 2015-12-10 DIAGNOSIS — R260 Ataxic gait: Secondary | ICD-10-CM | POA: Diagnosis not present

## 2015-12-10 DIAGNOSIS — I10 Essential (primary) hypertension: Secondary | ICD-10-CM | POA: Diagnosis not present

## 2015-12-10 DIAGNOSIS — R269 Unspecified abnormalities of gait and mobility: Secondary | ICD-10-CM | POA: Diagnosis not present

## 2015-12-10 DIAGNOSIS — G4733 Obstructive sleep apnea (adult) (pediatric): Secondary | ICD-10-CM | POA: Diagnosis not present

## 2015-12-13 DIAGNOSIS — Z8673 Personal history of transient ischemic attack (TIA), and cerebral infarction without residual deficits: Secondary | ICD-10-CM | POA: Diagnosis not present

## 2015-12-13 DIAGNOSIS — G4733 Obstructive sleep apnea (adult) (pediatric): Secondary | ICD-10-CM | POA: Diagnosis not present

## 2015-12-18 ENCOUNTER — Other Ambulatory Visit: Payer: Self-pay

## 2015-12-18 NOTE — Patient Outreach (Signed)
[place] [facility]) Care Management  12/18/2015  ISAUL PIETRANGELO 26-Jun-1940 [id number]   Telephone call to patient for monthly call. No answer.  Unable to leave a message.    Plan: RN Health Coach will attempt patient again in the month of August.   Emmerie Battaglia J Raiya Stainback, RN, MSN [place] [phone]

## 2015-12-21 ENCOUNTER — Other Ambulatory Visit: Payer: Self-pay

## 2015-12-21 NOTE — Patient Outreach (Signed)
[facility]) Care Management  [facility] Care Manager  12/21/2015   LEV CIOLEK [birth year] [id number]  Subjective: Telephone call to patient for monthly call.  Patient reports he is doing fine. Patient reports that he now has a CPAP machine for about 1 week.  He states he has adjusted well to it. Patient reports that his blood sugar today was 125. Patient denies problems with his breathing.  Reiterated with patient signs and symptoms of COPD and when to notify physician.  He verbalized understanding.    Objective:   Encounter Medications:  Outpatient Encounter Prescriptions as of 12/21/2015  Medication Sig Note  . acetaminophen (TYLENOL) 500 MG tablet Take 500 mg by mouth every 6 (six) hours as needed for moderate pain (left thigh).   Marland Kitchen albuterol (PROVENTIL HFA;VENTOLIN HFA) 108 (90 Base) MCG/ACT inhaler Inhale 2 puffs into the lungs every 4 (four) hours as needed for wheezing or shortness of breath.   . allopurinol (ZYLOPRIM) 300 MG tablet TAKE 1 TABLET ONE TIME DAILY   . amLODipine (NORVASC) 10 MG tablet Take 1 tablet (10 mg total) by mouth daily.   Marland Kitchen aspirin 325 MG tablet Take 1 tablet (325 mg total) by mouth daily.   . clopidogrel (PLAVIX) 75 MG tablet TAKE 1 TABLET EVERY DAY   . hydroxypropyl cellulose (LACRISERT) 5 MG INST Place 5 mg into the left eye at bedtime.   . insulin NPH-regular Human (NOVOLIN 70/30) (70-30) 100 UNIT/ML injection Inject 40 Units into the skin 2 (two) times daily with a meal.   . levETIRAcetam (KEPPRA) 500 MG tablet TAKE 1 TABLET TWICE DAILY   . levothyroxine (SYNTHROID, LEVOTHROID) 75 MCG tablet TAKE 1 TABLET EVERY MORNING   . lisinopril (PRINIVIL,ZESTRIL) 40 MG tablet Take 1 tablet (40 mg total) by mouth daily. Reported on 07/06/2015   . metFORMIN (GLUCOPHAGE) 500 MG tablet TAKE 1 TABLET TWICE DAILY   . metoprolol (LOPRESSOR) 50 MG tablet Take 0.5 tablets (25 mg total) by mouth 2 (two) times daily.   . pantoprazole (PROTONIX) 40 MG tablet TAKE 1  TABLET EVERY DAY BEFORE BREAKFAST   . pravastatin (PRAVACHOL) 40 MG tablet TAKE 2 TABLETS (80 MG TOTAL) BY MOUTH DAILY.   Marland Kitchen senna (SENOKOT) 8.6 MG tablet Take 1 tablet by mouth daily.   Marland Kitchen spironolactone (ALDACTONE) 25 MG tablet Take 25 mg by mouth daily.   Marland Kitchen triamcinolone cream (KENALOG) 0.1 % Apply 1 application topically 2 (two) times daily. Apply twice daily to rash on right elbow  , under a moist wrap for 4 weeks   . vitamin B-12 (CYANOCOBALAMIN) 1000 MCG tablet Take 1,000 mcg by mouth daily.   Marland Kitchen neomycin-polymyxin-hydrocortisone (CORTISPORIN) 3.5-10000-1 otic suspension Apply one drop to left ear three tiomes daily for 1 week (Patient not taking: Reported on 12/21/2015)   . tiotropium (SPIRIVA) 18 MCG inhalation capsule Place 18 mcg into inhaler and inhale daily. Reported on 11/22/2015 10/25/2015: Patient reports doctor asked him to hold.    No facility-administered encounter medications on file as of 12/21/2015.     Functional Status:  In your present state of health, do you have any difficulty performing the following activities: 09/26/2015 09/26/2015  Hearing? N N  Vision? N N  Difficulty concentrating or making decisions? Malvin Johns  Walking or climbing stairs? Y Y  Dressing or bathing? Y Y  Doing errands, shopping? Malvin Johns  Preparing Food and eating ? Y N  Using the Toilet? N N  In the past six months, have  you accidently leaked urine? Y Y  Do you have problems with loss of bowel control? N N  Managing your Medications? Y Y  Managing your Finances? [name]  Housekeeping or managing your Housekeeping? [name]  Some recent data might be hidden    Fall/Depression Screening: PHQ 2/9 Scores 12/21/2015 11/26/2015 11/22/2015 10/26/2015 10/18/2015 09/26/2015 09/25/2015  PHQ - 2 Score 0 0 0 0 0 0 0    Assessment: Patient continues to benefit from health coach outreach for disease management and support.   Plan:  [facility] CM Care Plan Problem Two   Flowsheet Row Most Recent Value  Care Plan Problem Two  COPD, knowledge  deficit  Role Documenting the Problem Two  [place] for Problem Two  Active  Interventions for Problem Two Long Term Goal   RN Health Coach reiterated with patient COPD action plan zones and when to notify physician.    THN Long Term Goal (31-90) days  Patient will not have COPD exacerbation in the next 60 days  THN Long Term Goal Start Date  11/22/15 [facility] continued]     [place] will contact patient in the month of September and patient agrees to next outreach. Jone Baseman, RN, MSN [place] [phone]

## 2015-12-27 ENCOUNTER — Other Ambulatory Visit: Payer: Self-pay | Admitting: Licensed Clinical Social Worker

## 2015-12-27 NOTE — Patient Outreach (Signed)
Assessment:  CSW spoke via phone with [name], spouse of client. CSW verified identity of [name]. [name] and [name] spoke of client needs. [name] said she transports client to and from client's scheduled medical appointments. She said client has prescribed medications and is taking medications as prescribed. [name] sees Dr. Moshe [name] as primary care doctor. Client has appointment scheduled with Dr. Moshe [name] in September of 2017. Client uses a cane to help him ambulate.  Client receives support from [facility], [place].  CSW and [name] spoke of client care plan. CSW encouraged that client attend all scheduled client medical appointments in the next 30 days. [name] said client is eating well and sleeping well. Client sees Dr. Dorris [name], endocrinologist, as scheduled.  Client has appointment with Dr. Dorris [name] on 01/24/16. [name] said client has Bi-Pap machine to use to help him sleep. Client has reported to [place] that Bi-Pap is very helpful to him and helps him sleep better and rest better at night.  Client likes to sit outdoors on sunny days. Client likes to visit with family and friends for socialization. [name] said client has not fallen recently. CSW reminded [name] of [facility] program support in areas of social work, Building surveyor, and pharmacy. [name] said she was enjoying communicating with [name] about health needs of client. CSW encouraged [name] or client to call CSW at [phone] as needed to discuss social work needs of client.   Plan:  Client to attend all scheduled client medical appointments in the next 30 days. CSW to call [name] in 4 weeks to assess client needs at that time.  [name].[name] MSW, LCSW Licensed Clinical Social Worker [facility] Care Management [phone]

## 2015-12-31 ENCOUNTER — Other Ambulatory Visit: Payer: Self-pay | Admitting: "Endocrinology

## 2016-01-08 ENCOUNTER — Other Ambulatory Visit: Payer: Self-pay | Admitting: "Endocrinology

## 2016-01-10 ENCOUNTER — Other Ambulatory Visit: Payer: Self-pay | Admitting: Family Medicine

## 2016-01-13 DIAGNOSIS — Z8673 Personal history of transient ischemic attack (TIA), and cerebral infarction without residual deficits: Secondary | ICD-10-CM | POA: Diagnosis not present

## 2016-01-13 DIAGNOSIS — G4733 Obstructive sleep apnea (adult) (pediatric): Secondary | ICD-10-CM | POA: Diagnosis not present

## 2016-01-15 ENCOUNTER — Other Ambulatory Visit: Payer: Self-pay | Admitting: "Endocrinology

## 2016-01-15 DIAGNOSIS — Z794 Long term (current) use of insulin: Secondary | ICD-10-CM | POA: Diagnosis not present

## 2016-01-15 DIAGNOSIS — J449 Chronic obstructive pulmonary disease, unspecified: Secondary | ICD-10-CM | POA: Diagnosis not present

## 2016-01-15 DIAGNOSIS — E039 Hypothyroidism, unspecified: Secondary | ICD-10-CM | POA: Diagnosis not present

## 2016-01-15 DIAGNOSIS — E1121 Type 2 diabetes mellitus with diabetic nephropathy: Secondary | ICD-10-CM | POA: Diagnosis not present

## 2016-01-15 DIAGNOSIS — I69251 Hemiplegia and hemiparesis following other nontraumatic intracranial hemorrhage affecting right dominant side: Secondary | ICD-10-CM | POA: Diagnosis not present

## 2016-01-15 DIAGNOSIS — I10 Essential (primary) hypertension: Secondary | ICD-10-CM | POA: Diagnosis not present

## 2016-01-15 DIAGNOSIS — R1312 Dysphagia, oropharyngeal phase: Secondary | ICD-10-CM | POA: Diagnosis not present

## 2016-01-15 LAB — COMPLETE METABOLIC PANEL WITH GFR
ALBUMIN: 3.8 g/dL (ref 3.6–5.1)
ALK PHOS: 57 U/L (ref 40–115)
ALT: 4 U/L — ABNORMAL LOW (ref 9–46)
AST: 7 U/L — ABNORMAL LOW (ref 10–35)
BILIRUBIN TOTAL: 0.3 mg/dL (ref 0.2–1.2)
BUN: 16 mg/dL (ref 7–25)
CO2: 26 mmol/L (ref 20–31)
CREATININE: 1.22 mg/dL — AB (ref 0.70–1.18)
Calcium: 9.2 mg/dL (ref 8.6–10.3)
Chloride: 104 mmol/L (ref 98–110)
GFR, EST AFRICAN AMERICAN: 68 mL/min (ref >=60)
GFR, EST NON AFRICAN AMERICAN: 58 mL/min — AB (ref >=60)
GLUCOSE: 101 mg/dL — AB (ref 65–99)
Potassium: 4.1 mmol/L (ref 3.5–5.3)
SODIUM: 139 mmol/L (ref 135–146)
TOTAL PROTEIN: 6.6 g/dL (ref 6.1–8.1)

## 2016-01-15 LAB — T4, FREE: Free T4: 1 ng/dL (ref 0.8–1.8)

## 2016-01-15 LAB — HEMOGLOBIN A1C
HEMOGLOBIN A1C: 6 % — AB (ref <5.7)
Mean Plasma Glucose: 126 mg/dL

## 2016-01-15 LAB — TSH: TSH: 2.53 mIU/L (ref 0.40–4.50)

## 2016-01-24 ENCOUNTER — Other Ambulatory Visit: Payer: Self-pay

## 2016-01-24 ENCOUNTER — Ambulatory Visit: Payer: Commercial Managed Care - HMO | Admitting: "Endocrinology

## 2016-01-24 NOTE — Patient Outreach (Signed)
[facility]) Care Management  [place]  01/24/2016   GAETAN UHLS 1940-09-05 [id number]  Subjective: Telephone call to patient for monthly.  Patient reports he is doing alright.  He reports he has had some problems with swelling to his feet.  Advised patient and wife who is on the phone as well the importance of elevating his legs.  They verbalized understanding. Patient reports that his breathing is good no increased shortness of breath.  Discussed with patient and wife COPD action plan and when to notify physician.  They verbalized understanding.    Objective:   Encounter Medications:  Outpatient Encounter Prescriptions as of 01/24/2016  Medication Sig Note  . acetaminophen (TYLENOL) 500 MG tablet Take 500 mg by mouth every 6 (six) hours as needed for moderate pain (left thigh).   Marland Kitchen albuterol (PROVENTIL HFA;VENTOLIN HFA) 108 (90 Base) MCG/ACT inhaler Inhale 2 puffs into the lungs every 4 (four) hours as needed for wheezing or shortness of breath.   . allopurinol (ZYLOPRIM) 300 MG tablet TAKE 1 TABLET ONE TIME DAILY   . amLODipine (NORVASC) 10 MG tablet TAKE 1 TABLET EVERY DAY   . aspirin 325 MG tablet Take 1 tablet (325 mg total) by mouth daily.   . clopidogrel (PLAVIX) 75 MG tablet TAKE 1 TABLET EVERY DAY   . hydroxypropyl cellulose (LACRISERT) 5 MG INST Place 5 mg into the left eye at bedtime.   . levETIRAcetam (KEPPRA) 500 MG tablet TAKE 1 TABLET TWICE DAILY   . levothyroxine (SYNTHROID, LEVOTHROID) 75 MCG tablet TAKE 1 TABLET EVERY MORNING   . lisinopril (PRINIVIL,ZESTRIL) 40 MG tablet TAKE 1 TABLET EVERY DAY   . metFORMIN (GLUCOPHAGE) 500 MG tablet TAKE 1 TABLET TWICE DAILY   . metoprolol (LOPRESSOR) 50 MG tablet Take 0.5 tablets (25 mg total) by mouth 2 (two) times daily.   Marland Kitchen neomycin-polymyxin-hydrocortisone (CORTISPORIN) 3.5-10000-1 otic suspension Apply one drop to left ear three tiomes daily for 1 week   . NOVOLIN 70/30 (70-30) 100 UNIT/ML injection  INJECT  60 UNITS SUBCUTANEOUSLY TWICE DAILY  20  MINUTES BEFORE BREAKFAST  AND SUPPER   . pantoprazole (PROTONIX) 40 MG tablet TAKE 1 TABLET EVERY DAY BEFORE BREAKFAST   . pravastatin (PRAVACHOL) 40 MG tablet TAKE 2 TABLETS (80 MG TOTAL) BY MOUTH DAILY.   Marland Kitchen senna (SENOKOT) 8.6 MG tablet Take 1 tablet by mouth daily.   Marland Kitchen spironolactone (ALDACTONE) 25 MG tablet Take 25 mg by mouth daily.   Marland Kitchen tiotropium (SPIRIVA) 18 MCG inhalation capsule Place 18 mcg into inhaler and inhale daily. Reported on 11/22/2015 10/25/2015: Patient reports doctor asked him to hold.   . triamcinolone cream (KENALOG) 0.1 % Apply 1 application topically 2 (two) times daily. Apply twice daily to rash on right elbow  , under a moist wrap for 4 weeks   . vitamin B-12 (CYANOCOBALAMIN) 1000 MCG tablet Take 1,000 mcg by mouth daily.    No facility-administered encounter medications on file as of 01/24/2016.     Functional Status:  In your present state of health, do you have any difficulty performing the following activities: 09/26/2015 09/26/2015  Hearing? N N  Vision? N N  Difficulty concentrating or making decisions? [name]  Walking or climbing stairs? Y Y  Dressing or bathing? Y Y  Doing errands, shopping? [name]  Preparing Food and eating ? Y N  Using the Toilet? N N  In the past six months, have you accidently leaked urine? [name]  Do  you have problems with loss of bowel control? N N  Managing your Medications? Y Y  Managing your Finances? [name]  Housekeeping or managing your Housekeeping? [name]  Some recent data might be hidden    Fall/Depression Screening: PHQ 2/9 Scores 01/24/2016 12/27/2015 12/21/2015 11/26/2015 11/22/2015 10/26/2015 10/18/2015  PHQ - 2 Score 0 0 0 0 0 0 0    Assessment: Patient continues to benefit from health coach outreach for disease management and support.    Plan:  [facility] CM Care Plan Problem Two   Flowsheet Row Most Recent Value  Care Plan Problem Two  COPD, knowledge deficit  Role Documenting the Problem Two   [place] for Problem Two  Active  Interventions for Problem Two Long Term Goal   RN Health Coach reinforced with patient COPD action plan zones and when to notify physician.    THN Long Term Goal (31-90) days  Patient will not have COPD exacerbation in the next 60 days  THN Long Term Goal Start Date  01/24/16 [facility] continued]     [place] will contact patient in the month of October and patient agrees to next outreach.  Jone Baseman, RN, MSN [place] [phone]

## 2016-01-30 ENCOUNTER — Other Ambulatory Visit: Payer: Self-pay | Admitting: Licensed Clinical Social Worker

## 2016-01-30 NOTE — Patient Outreach (Signed)
Assessment:  CSW spoke via phone with [name], spouse of client, on 01/30/16. CSW verified identity of [name]. [name] and [name] spoke of client needs. [name] is very supportive of client. She said that client is attending all scheduled client medical appointments.  Client sees Dr. Moshe Cipro as primary care doctor. Client is eating well and sleeping well. Client has prescribed medications and is taking medications as prescribed. [name] transports client to and from client's scheduled medical appointments. Client uses a cane to ambulate. [name] said that client has not had any recent falls. Client receives support from [place], [name], Therapist, sports.  CSW and [name] spoke of [name] program support for client.  CSW and [name] spoke of client care plan. CSW encouraged that client attend all scheduled client medical appointments in the next 30 days. [name] said client has appointment with Dr. Moshe Cipro on 02/21/16.   [name] said client has not had any [name] issues. [name] said that client likes to watch TV to relax.  Client also likes speaking via phone with family and friends to socialize and to relax. CSW encouraged that client or [name] call CSW at [phone] as needed to discuss social work needs of client.  CSW thanked [name] for phone conversation with CSW on 01/30/16.   Plan:  Client to attend all scheduled client medical appointments in the next 30 days.  CSW to collaborate with [place], RN in monitoring client needs.  CSW to call client or [name] in 4 weeks to assess client needs.  [name].[name] MSW, LCSW Licensed Clinical Social Worker [facility] Care Management [phone]

## 2016-02-05 ENCOUNTER — Other Ambulatory Visit: Payer: Self-pay | Admitting: Family Medicine

## 2016-02-13 DIAGNOSIS — Z8673 Personal history of transient ischemic attack (TIA), and cerebral infarction without residual deficits: Secondary | ICD-10-CM | POA: Diagnosis not present

## 2016-02-13 DIAGNOSIS — G4733 Obstructive sleep apnea (adult) (pediatric): Secondary | ICD-10-CM | POA: Diagnosis not present

## 2016-02-19 ENCOUNTER — Other Ambulatory Visit: Payer: Self-pay

## 2016-02-19 NOTE — Patient Outreach (Signed)
[facility] [place]) Care Management  [place]  02/19/2016   John GRISSETT May 21, 1940 [id number]  Subjective: Telephone call to patient for monthly call. Patient reports he is doing good. Patient reports he sees his primary doctor this week. Patient reports blood sugar was 122 this morning.  Patient reports he has no had any problems with his breathing. Patient states he does not take the flu vaccine as he got sick the last time he took one.  Discussed with patient EMMI myths about the flu vaccine. Will send written copy. He verbalized understanding but states he still will not take the vaccine.  Reviewed with patient COPD action plan and when to notify physician.  He verbalized understanding.  Patient reports some concern about possible cost of his insulin. He states he has not had to get it yet but thinks it will be expensive.  Discussed donut hole with patient and he is not sure if that is not the case but will check and notify me if this is the case in order to get some assistance.   Objective:   Encounter Medications:  Outpatient Encounter Prescriptions as of 02/19/2016  Medication Sig Note  . acetaminophen (TYLENOL) 500 MG tablet Take 500 mg by mouth every 6 (six) hours as needed for moderate pain (left thigh).   Marland Kitchen albuterol (PROVENTIL HFA;VENTOLIN HFA) 108 (90 Base) MCG/ACT inhaler Inhale 2 puffs into the lungs every 4 (four) hours as needed for wheezing or shortness of breath.   . allopurinol (ZYLOPRIM) 300 MG tablet TAKE 1 TABLET ONE TIME DAILY   . amLODipine (NORVASC) 10 MG tablet TAKE 1 TABLET EVERY DAY   . aspirin 325 MG tablet Take 1 tablet (325 mg total) by mouth daily.   . clopidogrel (PLAVIX) 75 MG tablet TAKE 1 TABLET EVERY DAY   . hydroxypropyl cellulose (LACRISERT) 5 MG INST Place 5 mg into the left eye at bedtime.   . levETIRAcetam (KEPPRA) 500 MG tablet TAKE 1 TABLET TWICE DAILY   . levothyroxine (SYNTHROID, LEVOTHROID) 75 MCG tablet TAKE 1 TABLET EVERY  MORNING   . lisinopril (PRINIVIL,ZESTRIL) 40 MG tablet TAKE 1 TABLET EVERY DAY   . metFORMIN (GLUCOPHAGE) 500 MG tablet TAKE 1 TABLET TWICE DAILY   . metoprolol (LOPRESSOR) 50 MG tablet Take 0.5 tablets (25 mg total) by mouth 2 (two) times daily.   Marland Kitchen neomycin-polymyxin-hydrocortisone (CORTISPORIN) 3.5-10000-1 otic suspension Apply one drop to left ear three tiomes daily for 1 week   . NOVOLIN 70/30 (70-30) 100 UNIT/ML injection INJECT  60 UNITS SUBCUTANEOUSLY TWICE DAILY  20  MINUTES BEFORE BREAKFAST  AND SUPPER   . pantoprazole (PROTONIX) 40 MG tablet TAKE 1 TABLET EVERY DAY BEFORE BREAKFAST   . pravastatin (PRAVACHOL) 40 MG tablet TAKE 2 TABLETS EVERY DAY   . senna (SENOKOT) 8.6 MG tablet Take 1 tablet by mouth daily.   Marland Kitchen spironolactone (ALDACTONE) 25 MG tablet Take 25 mg by mouth daily.   Marland Kitchen triamcinolone cream (KENALOG) 0.1 % Apply 1 application topically 2 (two) times daily. Apply twice daily to rash on right elbow  , under a moist wrap for 4 weeks   . vitamin B-12 (CYANOCOBALAMIN) 1000 MCG tablet Take 1,000 mcg by mouth daily.   Marland Kitchen tiotropium (SPIRIVA) 18 MCG inhalation capsule Place 18 mcg into inhaler and inhale daily. Reported on 11/22/2015 10/25/2015: Patient reports doctor asked him to hold.    No facility-administered encounter medications on file as of 02/19/2016.     Functional Status:  In your present state of health, do you have any difficulty performing the following activities: 09/26/2015 09/26/2015  Hearing? N N  Vision? N N  Difficulty concentrating or making decisions? [name]  Walking or climbing stairs? Y Y  Dressing or bathing? Y Y  Doing errands, shopping? [name]  Preparing Food and eating ? Y N  Using the Toilet? N N  In the past six months, have you accidently leaked urine? Y Y  Do you have problems with loss of bowel control? N N  Managing your Medications? Y Y  Managing your Finances? [name]  Housekeeping or managing your Housekeeping? [name]  Some recent data might be hidden     Fall/Depression Screening: PHQ 2/9 Scores 02/19/2016 01/24/2016 12/27/2015 12/21/2015 11/26/2015 11/22/2015 10/26/2015  PHQ - 2 Score 0 0 0 0 0 0 0    Assessment: Patient continues to benefit from health coach outreach for disease management and support.   Plan:  [facility] CM Care Plan Problem Two   Flowsheet Row Most Recent Value  Care Plan Problem Two  COPD, knowledge deficit  Role Documenting the Problem Two  [place] for Problem Two  Active  Interventions for Problem Two Long Term Goal   RN Health Coach reviewed with patient COPD action plan zones and when to notify physician.    THN Long Term Goal (31-90) days  Patient will not have COPD exacerbation in the next 60 days  THN Long Term Goal Start Date  01/24/16 [facility] continued]      [place] will contact patient in the month of November and patient agrees to next outreach.  Jone Baseman, RN, MSN [place] [phone]

## 2016-02-21 ENCOUNTER — Ambulatory Visit (INDEPENDENT_AMBULATORY_CARE_PROVIDER_SITE_OTHER): Payer: Commercial Managed Care - HMO

## 2016-02-21 VITALS — BP 130/68 | HR 88 | Resp 18 | Ht 70.0 in | Wt 247.0 lb

## 2016-02-21 DIAGNOSIS — Z0000 Encounter for general adult medical examination without abnormal findings: Secondary | ICD-10-CM

## 2016-02-21 NOTE — Patient Instructions (Signed)
Thank you for choosing [facility] for your health care needs  The Annual Wellness Visit is designed to allow [place] the chance to assist you in preserving and improving you health.   Dr. Moshe Cipro will see you back in 4 months for your next visit  If any labs are needed they will be mailed to you with the date to have them drawn  I will follow up with you regarding your concerns  I will also mail to you any information that I find to get you involved with some activities   If you have any concerns please don't hesitate to call.  The new # is [phone]

## 2016-02-25 NOTE — Progress Notes (Signed)
Subjective:    [name] is a 75 y.o. male who presents for Medicare Annual/Subsequent preventive examination.   Preventive Screening-Counseling & Management  Tobacco History  Smoking Status  . Former Smoker  . Packs/day: 0.50  . Years: 33.00  . Types: Cigarettes  Smokeless Tobacco  . Never Used    Comment: "stopped smoking in the 1990's    Current Problems (verified) Patient Active Problem List   Diagnosis Date Noted  . Choking 07/13/2015  . History of CVA (cerebrovascular accident) 06/20/2015  . CKD (chronic kidney disease) stage 2, GFR 60-89 ml/min 06/04/2015  . Hypothyroidism 06/04/2015  . Back pain 06/01/2015  . Facial droop 05/15/2015  . Dysarthria 05/15/2015  . Partial seizure ([place]) 05/12/2015  . Right hemiparesis ([place]) 05/12/2015  . Stroke ([place]) 05/10/2015  . GERD (gastroesophageal reflux disease) 05/10/2015  . CVA (cerebral infarction) 05/10/2015  . Vitamin B12 deficiency 05/01/2015  . Abnormality of gait 05/01/2015  . Tussive syncope 05/01/2015  . Recurrent falls 04/29/2015  . Dysphagia 04/26/2015  . Near syncope 04/26/2015  . COPD exacerbation ([place]) 04/19/2015  . Anemia 04/19/2015  . COPD with acute exacerbation ([place]) 02/11/2015  . Carotid bruit 12/08/2013  . Angina pectoris ([place]) 08/30/2013  . Type 2 diabetes mellitus with diabetic nephropathy ([place]) 05/22/2013  . Onychomycosis of toenail 05/22/2013  . COPD (chronic obstructive pulmonary disease) ([place]) 11/22/2012  . Sleep related hypoxia 08/30/2012  . Vitamin D deficiency 12/23/2011  . PSORIASIS 07/18/2010  . Gout 01/13/2010  . Type 2 diabetes mellitus with polyneuropathy ([place]) 11/28/2008  . Hyperlipidemia 10/05/2007  . Obesity (BMI 30.0-34.9) 10/05/2007  . Essential hypertension 10/05/2007  . Coronary atherosclerosis 10/05/2007  . GERD 10/05/2007  . Arthritis of knee, right 10/05/2007    Medications Prior to Visit Current Outpatient Prescriptions on File Prior to Visit  Medication Sig  Dispense Refill  . acetaminophen (TYLENOL) 500 MG tablet Take 500 mg by mouth every 6 (six) hours as needed for moderate pain (left thigh).    Marland Kitchen albuterol (PROVENTIL HFA;VENTOLIN HFA) 108 (90 Base) MCG/ACT inhaler Inhale 2 puffs into the lungs every 4 (four) hours as needed for wheezing or shortness of breath. 18 g 1  . allopurinol (ZYLOPRIM) 300 MG tablet TAKE 1 TABLET ONE TIME DAILY 90 tablet 5  . amLODipine (NORVASC) 10 MG tablet TAKE 1 TABLET EVERY DAY 90 tablet 1  . aspirin 325 MG tablet Take 1 tablet (325 mg total) by mouth daily.    . clopidogrel (PLAVIX) 75 MG tablet TAKE 1 TABLET EVERY DAY 90 tablet 0  . hydroxypropyl cellulose (LACRISERT) 5 MG INST Place 5 mg into the left eye at bedtime. 60 each 0  . levETIRAcetam (KEPPRA) 500 MG tablet TAKE 1 TABLET TWICE DAILY 180 tablet 0  . levothyroxine (SYNTHROID, LEVOTHROID) 75 MCG tablet TAKE 1 TABLET EVERY MORNING 90 tablet 0  . lisinopril (PRINIVIL,ZESTRIL) 40 MG tablet TAKE 1 TABLET EVERY DAY 90 tablet 1  . metFORMIN (GLUCOPHAGE) 500 MG tablet TAKE 1 TABLET TWICE DAILY 180 tablet 0  . metoprolol (LOPRESSOR) 50 MG tablet Take 0.5 tablets (25 mg total) by mouth 2 (two) times daily. 30 tablet 2  . NOVOLIN 70/30 (70-30) 100 UNIT/ML injection INJECT  60 UNITS SUBCUTANEOUSLY TWICE DAILY  20  MINUTES BEFORE BREAKFAST  AND SUPPER 60 mL 1  . pantoprazole (PROTONIX) 40 MG tablet TAKE 1 TABLET EVERY DAY BEFORE BREAKFAST 90 tablet 0  . pravastatin (PRAVACHOL) 40 MG tablet TAKE 2 TABLETS EVERY DAY 180 tablet 0  .  senna (SENOKOT) 8.6 MG tablet Take 1 tablet by mouth daily.    Marland Kitchen spironolactone (ALDACTONE) 25 MG tablet Take 25 mg by mouth daily.    Marland Kitchen tiotropium (SPIRIVA) 18 MCG inhalation capsule Place 18 mcg into inhaler and inhale daily. Reported on 11/22/2015    . triamcinolone cream (KENALOG) 0.1 % Apply 1 application topically 2 (two) times daily. Apply twice daily to rash on right elbow  , under a moist wrap for 4 weeks 45 g 0  . vitamin B-12  (CYANOCOBALAMIN) 1000 MCG tablet Take 1,000 mcg by mouth daily.     No current facility-administered medications on file prior to visit.     Current Medications (verified) Current Outpatient Prescriptions  Medication Sig Dispense Refill  . acetaminophen (TYLENOL) 500 MG tablet Take 500 mg by mouth every 6 (six) hours as needed for moderate pain (left thigh).    Marland Kitchen albuterol (PROVENTIL HFA;VENTOLIN HFA) 108 (90 Base) MCG/ACT inhaler Inhale 2 puffs into the lungs every 4 (four) hours as needed for wheezing or shortness of breath. 18 g 1  . allopurinol (ZYLOPRIM) 300 MG tablet TAKE 1 TABLET ONE TIME DAILY 90 tablet 5  . amLODipine (NORVASC) 10 MG tablet TAKE 1 TABLET EVERY DAY 90 tablet 1  . aspirin 325 MG tablet Take 1 tablet (325 mg total) by mouth daily.    . clopidogrel (PLAVIX) 75 MG tablet TAKE 1 TABLET EVERY DAY 90 tablet 0  . hydroxypropyl cellulose (LACRISERT) 5 MG INST Place 5 mg into the left eye at bedtime. 60 each 0  . levETIRAcetam (KEPPRA) 500 MG tablet TAKE 1 TABLET TWICE DAILY 180 tablet 0  . levothyroxine (SYNTHROID, LEVOTHROID) 75 MCG tablet TAKE 1 TABLET EVERY MORNING 90 tablet 0  . lisinopril (PRINIVIL,ZESTRIL) 40 MG tablet TAKE 1 TABLET EVERY DAY 90 tablet 1  . metFORMIN (GLUCOPHAGE) 500 MG tablet TAKE 1 TABLET TWICE DAILY 180 tablet 0  . metoprolol (LOPRESSOR) 50 MG tablet Take 0.5 tablets (25 mg total) by mouth 2 (two) times daily. 30 tablet 2  . NOVOLIN 70/30 (70-30) 100 UNIT/ML injection INJECT  60 UNITS SUBCUTANEOUSLY TWICE DAILY  20  MINUTES BEFORE BREAKFAST  AND SUPPER 60 mL 1  . pantoprazole (PROTONIX) 40 MG tablet TAKE 1 TABLET EVERY DAY BEFORE BREAKFAST 90 tablet 0  . pravastatin (PRAVACHOL) 40 MG tablet TAKE 2 TABLETS EVERY DAY 180 tablet 0  . senna (SENOKOT) 8.6 MG tablet Take 1 tablet by mouth daily.    Marland Kitchen spironolactone (ALDACTONE) 25 MG tablet Take 25 mg by mouth daily.    Marland Kitchen tiotropium (SPIRIVA) 18 MCG inhalation capsule Place 18 mcg into inhaler and inhale  daily. Reported on 11/22/2015    . triamcinolone cream (KENALOG) 0.1 % Apply 1 application topically 2 (two) times daily. Apply twice daily to rash on right elbow  , under a moist wrap for 4 weeks 45 g 0  . vitamin B-12 (CYANOCOBALAMIN) 1000 MCG tablet Take 1,000 mcg by mouth daily.     No current facility-administered medications for this visit.      Allergies (verified) Review of patient's allergies indicates no known allergies.   PAST HISTORY  Family History Family History  Problem Relation Age of Onset  . Hypertension Mother   . Diabetes Mother   . Hypertension Father   . Diabetes Brother   . Stroke Daughter   . Arthritis      Family History   . Diabetes      family History  Social History Social History  Substance Use Topics  . Smoking status: Former Smoker    Packs/day: 0.50    Years: 33.00    Types: Cigarettes  . Smokeless tobacco: Never Used     Comment: "stopped smoking in the 1990's  . Alcohol use No    Are there smokers in your home (other than you)?  No  Risk Factors Current exercise habits: Exercise is limited by neurologic condition(s): hx of CVA and seizure disorder.  Dietary issues discussed: Reviewed watching portion sizes and the upcoming changes with food labels.    Cardiac risk factors: advanced age (older than 31 for men, 72 for women), dyslipidemia, hypertension, male gender, obesity (BMI >= 30 kg/m2), sedentary lifestyle and smoking/ tobacco exposure.  Depression Screen (Note: if answer to either of the following is "Yes", a more complete depression screening is indicated)   Q1: Over the past two weeks, have you felt down, depressed or hopeless? No  Q2: Over the past two weeks, have you felt little interest or pleasure in doing things? No  Have you lost interest or pleasure in daily life? No  Do you often feel hopeless? No  Do you cry easily over simple problems? No  Activities of Daily Living In your present state of health, do you have  any difficulty performing the following activities?:  Driving? Yes Managing money?  Yes Feeding yourself? No Getting from bed to chair? Yes Climbing a flight of stairs? Yes Preparing food and eating?: No Bathing or showering? No Getting dressed: Yes Getting to the toilet? Yes Using the toilet:No Moving around from place to place: Yes In the past year have you fallen or had a near fall?:No   Are you sexually active?  No  Do you have more than one partner?  Na  Hearing Difficulties: No Do you often ask people to speak up or repeat themselves? No Do you experience ringing or noises in your ears? No Do you have difficulty understanding soft or whispered voices? No   Do you feel that you have a problem with memory? No  Do you often misplace items? No  Do you feel safe at home?  Yes  Cognitive Testing  Alert? Yes  Normal Appearance?Yes  Oriented to person? Yes  Place? Yes   Time? Yes  Recall of three objects?  Yes  Can perform simple calculations? Yes  Displays appropriate judgment?Yes  Can read the correct time from a watch face?Yes   Advanced Directives have been discussed with the patient? Yes   List the Names of Other Physician/Practitioners you currently use: 1.    Indicate any recent Medical Services you may have received from other than [facility] providers in the past year (date may be approximate).  Immunization History  Administered Date(s) Administered  . Influenza Whole 03/18/2005  . Pneumococcal Conjugate-13 12/08/2013  . Pneumococcal Polysaccharide-23 06/09/2012  . Td 12/04/2003    Screening Tests Health Maintenance  Topic Date Due  . INFLUENZA VACCINE  05/07/2016 (Originally 12/18/2015)  . ZOSTAVAX  04/17/2017 (Originally 03/25/2002)  . TETANUS/TDAP  05/06/2017 (Originally 12/03/2013)  . HEMOGLOBIN A1C  07/16/2016  . FOOT EXAM  08/02/2016  . OPHTHALMOLOGY EXAM  10/01/2016  . COLONOSCOPY  03/17/2022  . PNA vac Low Risk Adult  Completed    All answers were  reviewed with the patient and necessary referrals were made:  Vanetta Mulders, LPN   [id number]   History reviewed: allergies, current medications, past family history, past medical history, past social  history, past surgical history and problem list  Review of Systems A comprehensive review of systems was negative.    Objective:     Vision by Snellen chart: right eye:20/30, left [id number] to measure Blood pressure 130/68, pulse 88, resp. rate 18, height 5\' 10"  (1.778 m), weight 247 lb (112 kg), SpO2 92 %. Body mass index is 35.44 kg/m.  No exam performed today, annual wellness without physical exam.     Assessment:      Plan:     During the course of the visit the patient was educated and counseled about appropriate screening and preventive services including:    Influenza vaccine  Diet review for nutrition referral? Yes ____  Not Indicated __x__   Patient Instructions (the written plan) was given to the patient.  Medicare Attestation I have personally reviewed: The patient's medical and social history Their use of alcohol, tobacco or illicit drugs Their current medications and supplements The patient's functional ability including ADLs,fall risks, home safety risks, cognitive, and hearing and visual impairment Diet and physical activities Evidence for depression or mood disorders  The patient's weight, height, BMI, and visual acuity have been recorded in the chart.  I have made referrals, counseling, and provided education to the patient based on review of the above and I have provided the patient with a written personalized care plan for preventive services.     Denman George [place], [place]   [id number]

## 2016-02-27 DIAGNOSIS — I2510 Atherosclerotic heart disease of native coronary artery without angina pectoris: Secondary | ICD-10-CM | POA: Diagnosis not present

## 2016-02-27 DIAGNOSIS — E785 Hyperlipidemia, unspecified: Secondary | ICD-10-CM | POA: Diagnosis not present

## 2016-02-27 DIAGNOSIS — I119 Hypertensive heart disease without heart failure: Secondary | ICD-10-CM | POA: Diagnosis not present

## 2016-02-27 DIAGNOSIS — I252 Old myocardial infarction: Secondary | ICD-10-CM | POA: Diagnosis not present

## 2016-02-27 DIAGNOSIS — I639 Cerebral infarction, unspecified: Secondary | ICD-10-CM | POA: Diagnosis not present

## 2016-02-27 DIAGNOSIS — E669 Obesity, unspecified: Secondary | ICD-10-CM | POA: Diagnosis not present

## 2016-02-27 DIAGNOSIS — E119 Type 2 diabetes mellitus without complications: Secondary | ICD-10-CM | POA: Diagnosis not present

## 2016-02-28 ENCOUNTER — Other Ambulatory Visit: Payer: Self-pay | Admitting: Licensed Clinical Social Worker

## 2016-02-28 NOTE — Patient Outreach (Signed)
Assessment:  CSW spoke via phone with [name], spouse of client on 02/28/16. CSW verified identity of Bennard Zafar.  CSW received verbal permission from Xion Rumfield on 02/28/16 for CSW to speak with [name] about current needs of client.  Client has prescribed medications and is taking medications as prescribed. Client has support from his wife, [name]. [name] transports client to and from client's scheduled medical appointments. Client sees Dr. Moshe Cipro as primary care doctor. Client uses a cane to help him ambulate. Client receives support from [place], [name], Therapist, sports.  [place] and [name] spoke of client care plan. CSW encouraged that client attend all scheduled client medical appointments in next 30 days. CSW also encouraged that client continue to use relaxation techniques (watching favorite TV shows, speaking via phone with family and friends) to help him relax and manage stress.  Client sees Dr. [name], endocrinologist, as scheduled. Client is eating well and sleeping well. He uses Bi-Pap machine to help him with breathing when he sleeps. [name] said Bi-Pap machine is helpful to client and that client is resting well at night. Client had medical appointment earlier this month with Dr. Moshe Cipro.  Client plans to schedule appointment with Dr. [name], endocrinologist, for later this month.  Client had appointment with cardiologist on 02/27/16.  Client has not had any recent falls. Client enjoys visits at his home with family and friends.  Client does not have any current pain issues.  CSW spoke with [name] about [facility] program services in nursing, social work and pharmacy.  [name] feels that current [facility] program support is working well with client.  CSW thanked [name] for phone call with CSW on 02/28/16. CSW encouraged client or [name] to call CSW at [phone] as needed to discuss social work needs of client.  Plan:  Client to attend all scheduled client medical appointments in  next 30 days.  CSW to collaborate, as needed, with [name] RN, in monitoring needs of client.  CSW to call client or Kenjuan Hrivnak in 4 weeks to assess client needs at that time.  [name]. Grosser MSW, LCSW Licensed Clinical Social Worker [facility] Care Management [phone]

## 2016-03-04 ENCOUNTER — Other Ambulatory Visit: Payer: Self-pay

## 2016-03-04 MED ORDER — INSULIN NPH ISOPHANE & REGULAR (70-30) 100 UNIT/ML ~~LOC~~ SUSP: SUBCUTANEOUS | 0 refills | Status: DC

## 2016-03-14 DIAGNOSIS — Z8673 Personal history of transient ischemic attack (TIA), and cerebral infarction without residual deficits: Secondary | ICD-10-CM | POA: Diagnosis not present

## 2016-03-14 DIAGNOSIS — G4733 Obstructive sleep apnea (adult) (pediatric): Secondary | ICD-10-CM | POA: Diagnosis not present

## 2016-03-20 ENCOUNTER — Other Ambulatory Visit: Payer: Self-pay

## 2016-03-20 NOTE — Patient Outreach (Signed)
[facility]) Care Management  [place]  03/20/2016   [name] 1940/07/07 [id number]  Subjective: Telephone call to patient for monthly call.  Patient reports he is doing ok.  Patient reports seeing primary doctor recently. Visit went well.  Patient reports blood sugar today 136.  Discussed with patient blood sugars and continuing to keep sugars under control.  Patient denies any problems with his breathing.  Discussed with patient COPD action plan and when to notify physician.  He verbalized understanding.   Objective:   Encounter Medications:  Outpatient Encounter Prescriptions as of 03/20/2016  Medication Sig Note  . acetaminophen (TYLENOL) 500 MG tablet Take 500 mg by mouth every 6 (six) hours as needed for moderate pain (left thigh).   Marland Kitchen albuterol (PROVENTIL HFA;VENTOLIN HFA) 108 (90 Base) MCG/ACT inhaler Inhale 2 puffs into the lungs every 4 (four) hours as needed for wheezing or shortness of breath.   . allopurinol (ZYLOPRIM) 300 MG tablet TAKE 1 TABLET ONE TIME DAILY   . amLODipine (NORVASC) 10 MG tablet TAKE 1 TABLET EVERY DAY   . aspirin 325 MG tablet Take 1 tablet (325 mg total) by mouth daily.   . clopidogrel (PLAVIX) 75 MG tablet TAKE 1 TABLET EVERY DAY   . hydroxypropyl cellulose (LACRISERT) 5 MG INST Place 5 mg into the left eye at bedtime.   . insulin NPH-regular Human (NOVOLIN 70/30) (70-30) 100 UNIT/ML injection INJECT 40 UNITS SUBCUTANEOUSLY TWICE DAILY   . levETIRAcetam (KEPPRA) 500 MG tablet TAKE 1 TABLET TWICE DAILY   . levothyroxine (SYNTHROID, LEVOTHROID) 75 MCG tablet TAKE 1 TABLET EVERY MORNING   . lisinopril (PRINIVIL,ZESTRIL) 40 MG tablet TAKE 1 TABLET EVERY DAY   . metFORMIN (GLUCOPHAGE) 500 MG tablet TAKE 1 TABLET TWICE DAILY   . metoprolol (LOPRESSOR) 50 MG tablet Take 0.5 tablets (25 mg total) by mouth 2 (two) times daily.   . pantoprazole (PROTONIX) 40 MG tablet TAKE 1 TABLET EVERY DAY BEFORE BREAKFAST   . pravastatin  (PRAVACHOL) 40 MG tablet TAKE 2 TABLETS EVERY DAY   . senna (SENOKOT) 8.6 MG tablet Take 1 tablet by mouth daily.   Marland Kitchen spironolactone (ALDACTONE) 25 MG tablet Take 25 mg by mouth daily.   Marland Kitchen triamcinolone cream (KENALOG) 0.1 % Apply 1 application topically 2 (two) times daily. Apply twice daily to rash on right elbow  , under a moist wrap for 4 weeks   . vitamin B-12 (CYANOCOBALAMIN) 1000 MCG tablet Take 1,000 mcg by mouth daily.   Marland Kitchen tiotropium (SPIRIVA) 18 MCG inhalation capsule Place 18 mcg into inhaler and inhale daily. Reported on 11/22/2015 10/25/2015: Patient reports doctor asked him to hold.    No facility-administered encounter medications on file as of 03/20/2016.     Functional Status:  In your present state of health, do you have any difficulty performing the following activities: 09/26/2015 09/26/2015  Hearing? N N  Vision? N N  Difficulty concentrating or making decisions? [name]  Walking or climbing stairs? Y Y  Dressing or bathing? Y Y  Doing errands, shopping? [name]  Preparing Food and eating ? Y N  Using the Toilet? N N  In the past six months, have you accidently leaked urine? Y Y  Do you have problems with loss of bowel control? N N  Managing your Medications? Y Y  Managing your Finances? [name]  Housekeeping or managing your Housekeeping? Y Y  Some recent data might be hidden    Fall/Depression Screening:  PHQ 2/9 Scores 03/20/2016 02/19/2016 01/24/2016 12/27/2015 12/21/2015 11/26/2015 11/22/2015  PHQ - 2 Score 0 0 0 0 0 0 0    Assessment: Patient continues to benefit from health coach outreach for disease management and support.    Plan:  [facility] CM Care Plan Problem Two   Flowsheet Row Most Recent Value  Care Plan Problem Two  COPD, knowledge deficit  Role Documenting the Problem Two  [place] for Problem Two  Active  Interventions for Problem Two Long Term Goal   RN Health Coach reiterated with patient COPD action plan zones and when to notify physician.    THN Long  Term Goal (31-90) days  Patient will not have COPD exacerbation in the next 60 days  THN Long Term Goal Start Date  03/20/16 [facility] continued]     [place] will contact patient in the month of December and patient agrees to next outreach.  Jone Baseman, RN, MSN [place] [phone]

## 2016-03-24 ENCOUNTER — Other Ambulatory Visit: Payer: Self-pay | Admitting: "Endocrinology

## 2016-03-24 ENCOUNTER — Encounter: Payer: Self-pay | Admitting: "Endocrinology

## 2016-03-24 ENCOUNTER — Ambulatory Visit (INDEPENDENT_AMBULATORY_CARE_PROVIDER_SITE_OTHER): Payer: Commercial Managed Care - HMO | Admitting: "Endocrinology

## 2016-03-24 VITALS — BP 134/66 | HR 84 | Ht 70.0 in | Wt 244.0 lb

## 2016-03-24 DIAGNOSIS — Z794 Long term (current) use of insulin: Secondary | ICD-10-CM | POA: Diagnosis not present

## 2016-03-24 DIAGNOSIS — E782 Mixed hyperlipidemia: Secondary | ICD-10-CM

## 2016-03-24 DIAGNOSIS — I10 Essential (primary) hypertension: Secondary | ICD-10-CM

## 2016-03-24 DIAGNOSIS — E1121 Type 2 diabetes mellitus with diabetic nephropathy: Secondary | ICD-10-CM | POA: Diagnosis not present

## 2016-03-24 DIAGNOSIS — E038 Other specified hypothyroidism: Secondary | ICD-10-CM

## 2016-03-24 NOTE — Progress Notes (Signed)
Subjective:    Patient ID: [name], male    DOB: [birth year], PCP Tula Nakayama, MD   Past Medical History:  Diagnosis Date  . Abnormality of gait 05/01/2015  . Arthritis    "left leg" (08/30/2013)  . Arthritis of knee   . CAD (coronary artery disease)   . Chronic back pain   . Chronic bronchitis ([place])    "get it q yr"  . COPD (chronic obstructive pulmonary disease) ([place])   . CVA (cerebral infarction)   . Diabetes mellitus, type 1   . GERD (gastroesophageal reflux disease)   . Gout   . Hyperlipidemia   . Hypertension   . Myocardial infarction 1999  . Obesity   . On home oxygen therapy    "2L; only at night" (08/30/2013)  . Pneumonia    "a few times; last time was ~ 06/2013"  . Psoriasis   . Stroke ([place])   . Tussive syncope 05/01/2015  . Vitamin B12 deficiency 05/01/2015   Past Surgical History:  Procedure Laterality Date  . APPENDECTOMY    . COLONOSCOPY  03/17/2012   Procedure: COLONOSCOPY;  Surgeon: Rogene Houston, MD;  Location: [facility] SU[facility];  Service: Endoscopy;  Laterality: N/A;  830  . CORONARY ANGIOPLASTY WITH STENT PLACEMENT  1999- 2009-08/30/2013   "counting today's, I have 5 stents" (08/30/2013)  . CYSTOSCOPY WITH URETHRAL DILATATION N/A 11/26/2012   Procedure: CYSTOSCOPY WITH URETHRAL DILATATION;  Surgeon: Malka So, MD;  Location: [facility] ORS;  Service: Urology;  Laterality: N/A;  . LEFT HEART CATHETERIZATION WITH CORONARY ANGIOGRAM N/A 08/30/2013   Procedure: LEFT HEART CATHETERIZATION WITH CORONARY ANGIOGRAM;  Surgeon: Clent Demark, MD;  Location: [facility] CATH LAB;  Service: Cardiovascular;  Laterality: N/A;  . PERCUTANEOUS CORONARY STENT INTERVENTION (PCI-S) Right 08/30/2013   Procedure: PERCUTANEOUS CORONARY STENT INTERVENTION (PCI-S);  Surgeon: Clent Demark, MD;  Location: [facility] CATH LAB;  Service: Cardiovascular;  Laterality: Right;   Social History   Social History  . Marital status: Married    Spouse name: N/A  . Number of children: 2  .  Years of education: 11   Occupational History  . janitorial services     Social History Main Topics  . Smoking status: Former Smoker    Packs/day: 0.50    Years: 33.00    Types: Cigarettes  . Smokeless tobacco: Never Used     Comment: "stopped smoking in the 1990's  . Alcohol use No  . Drug use: No  . Sexual activity: Not Currently   Other Topics Concern  . None   Social History Narrative   Patient drinks about 3 cups of soda daily.    Patient is left handed.    Outpatient Encounter Prescriptions as of 03/24/2016  Medication Sig  . acetaminophen (TYLENOL) 500 MG tablet Take 500 mg by mouth every 6 (six) hours as needed for moderate pain (left thigh).  Marland Kitchen albuterol (PROVENTIL HFA;VENTOLIN HFA) 108 (90 Base) MCG/ACT inhaler Inhale 2 puffs into the lungs every 4 (four) hours as needed for wheezing or shortness of breath.  . allopurinol (ZYLOPRIM) 300 MG tablet TAKE 1 TABLET ONE TIME DAILY  . amLODipine (NORVASC) 10 MG tablet TAKE 1 TABLET EVERY DAY  . aspirin 325 MG tablet Take 1 tablet (325 mg total) by mouth daily.  . clopidogrel (PLAVIX) 75 MG tablet TAKE 1 TABLET EVERY DAY  . hydroxypropyl cellulose (LACRISERT) 5 MG INST Place 5 mg into the left eye at bedtime.  Marland Kitchen  insulin NPH-regular Human (NOVOLIN 70/30) (70-30) 100 UNIT/ML injection INJECT 40 UNITS SUBCUTANEOUSLY TWICE DAILY  . levETIRAcetam (KEPPRA) 500 MG tablet TAKE 1 TABLET TWICE DAILY  . levothyroxine (SYNTHROID, LEVOTHROID) 75 MCG tablet TAKE 1 TABLET EVERY MORNING  . lisinopril (PRINIVIL,ZESTRIL) 40 MG tablet TAKE 1 TABLET EVERY DAY  . metFORMIN (GLUCOPHAGE) 500 MG tablet TAKE 1 TABLET TWICE DAILY  . metoprolol (LOPRESSOR) 50 MG tablet Take 0.5 tablets (25 mg total) by mouth 2 (two) times daily.  . pantoprazole (PROTONIX) 40 MG tablet TAKE 1 TABLET EVERY DAY BEFORE BREAKFAST  . pravastatin (PRAVACHOL) 40 MG tablet TAKE 2 TABLETS EVERY DAY  . senna (SENOKOT) 8.6 MG tablet Take 1 tablet by mouth daily.  Marland Kitchen spironolactone  (ALDACTONE) 25 MG tablet Take 25 mg by mouth daily.  Marland Kitchen tiotropium (SPIRIVA) 18 MCG inhalation capsule Place 18 mcg into inhaler and inhale daily. Reported on 11/22/2015  . triamcinolone cream (KENALOG) 0.1 % Apply 1 application topically 2 (two) times daily. Apply twice daily to rash on right elbow  , under a moist wrap for 4 weeks  . vitamin B-12 (CYANOCOBALAMIN) 1000 MCG tablet Take 1,000 mcg by mouth daily.   No facility-administered encounter medications on file as of 03/24/2016.    ALLERGIES: No Known Allergies VACCINATION STATUS: Immunization History  Administered Date(s) Administered  . Influenza Whole 03/18/2005  . Pneumococcal Conjugate-13 12/08/2013  . Pneumococcal Polysaccharide-23 06/09/2012  . Td 12/04/2003    Diabetes  He presents for his follow-up diabetic visit. He has type 2 diabetes mellitus. Onset time: He was diagnosed at approximate age of 32 years. His disease course has been improving. There are no hypoglycemic associated symptoms. Pertinent negatives for hypoglycemia include no confusion, headaches, pallor or seizures. Pertinent negatives for diabetes include no chest pain, no fatigue, no polydipsia, no polyphagia, no polyuria and no weakness. Symptoms are improving. Diabetic complications include a CVA, heart disease, nephropathy and peripheral neuropathy. Risk factors for coronary artery disease include dyslipidemia, diabetes mellitus, male sex, obesity, tobacco exposure and sedentary lifestyle. Current diabetic treatment includes insulin injections. He is compliant with treatment most of the time. His weight is stable. He is following a generally unhealthy diet. When asked about meal planning, he reported none. Exercise: Currently undergoing physical therapy after recent cerebrovascular accident. His breakfast blood glucose range is generally 140-180 mg/dl. His highest blood glucose is 140-180 mg/dl. His overall blood glucose range is 140-180 mg/dl. An ACE  inhibitor/angiotensin II receptor blocker is being taken.  Hyperlipidemia  This is a chronic problem. The current episode started more than 1 year ago. The problem is controlled. Exacerbating diseases include diabetes, hypothyroidism and obesity. Pertinent negatives include no chest pain, myalgias or shortness of breath. Current antihyperlipidemic treatment includes statins. Risk factors for coronary artery disease include diabetes mellitus, dyslipidemia, hypertension, male sex and a sedentary lifestyle.  Hypertension  This is a chronic problem. The current episode started more than 1 year ago. The problem is controlled. Pertinent negatives include no chest pain, headaches, neck pain, palpitations or shortness of breath. Past treatments include ACE inhibitors. Hypertensive end-organ damage includes CAD/MI, CVA and a thyroid problem.  Thyroid Problem  Presents for follow-up visit. Patient reports no constipation, diarrhea, fatigue or palpitations. The symptoms have been stable. Past treatments include levothyroxine. His past medical history is significant for diabetes and hyperlipidemia.     Review of Systems  Constitutional: Negative for chills, fatigue, fever and unexpected weight change.  HENT: Negative for dental problem, mouth sores and trouble  swallowing.   Eyes: Negative for visual disturbance.  Respiratory: Negative for cough, choking, chest tightness, shortness of breath and wheezing.   Cardiovascular: Negative for chest pain, palpitations and leg swelling.  Gastrointestinal: Negative for abdominal distention, abdominal pain, constipation, diarrhea, nausea and vomiting.  Endocrine: Negative for polydipsia, polyphagia and polyuria.  Genitourinary: Negative for dysuria, flank pain, hematuria and urgency.  Musculoskeletal: Positive for gait problem. Negative for back pain, myalgias and neck pain.       He walks with a walker due to recent CVA.  Skin: Negative for pallor, rash and wound.   Neurological: Negative for seizures, syncope, weakness, numbness and headaches.  Psychiatric/Behavioral: Negative.  Negative for confusion and dysphoric mood.    Objective:    BP 134/66   Pulse 84   Ht 5\' 10"  (1.778 m)   Wt 244 lb (110.7 kg)   BMI 35.01 kg/m   Wt Readings from Last 3 Encounters:  03/24/16 244 lb (110.7 kg)  02/21/16 247 lb (112 kg)  10/18/15 246 lb (111.6 kg)    Physical Exam  Constitutional: He is oriented to person, place, and time. He appears well-developed and well-nourished. He is cooperative. No distress.  HENT:  Head: Normocephalic and atraumatic.  Eyes: EOM are normal.  Neck: Normal range of motion. Neck supple. No tracheal deviation present. No thyromegaly present.  Cardiovascular: Normal rate, S1 normal, S2 normal and normal heart sounds.  Exam reveals no gallop.   No murmur heard. Pulses:      Dorsalis pedis pulses are 1+ on the right side, and 1+ on the left side.       Posterior tibial pulses are 1+ on the right side, and 1+ on the left side.  Pulmonary/Chest: Breath sounds normal. No respiratory distress. He has no wheezes.  Abdominal: Soft. Bowel sounds are normal. He exhibits no distension. There is no tenderness. There is no guarding and no CVA tenderness.  Musculoskeletal: He exhibits edema.       Right shoulder: He exhibits no swelling and no deformity.   Mild bilateral pedal and lower leg edema.  Neurological: He is alert and oriented to person, place, and time. He has normal strength and normal reflexes. No cranial nerve deficit or sensory deficit. Gait normal.  Skin: Skin is warm and dry. No rash noted. No cyanosis. Nails show no clubbing.  Psychiatric: He has a normal mood and affect. His speech is normal and behavior is normal. Judgment and thought content normal. Cognition and memory are normal.    CMP     Component Value Date/Time   NA 139 01/15/2016 1021   K 4.1 01/15/2016 1021   CL 104 01/15/2016 1021   CO2 26 01/15/2016 1021    GLUCOSE 101 (H) 01/15/2016 1021   BUN 16 01/15/2016 1021   CREATININE 1.22 (H) 01/15/2016 1021   CALCIUM 9.2 01/15/2016 1021   PROT 6.6 01/15/2016 1021   ALBUMIN 3.8 01/15/2016 1021   AST 7 (L) 01/15/2016 1021   ALT 4 (L) 01/15/2016 1021   ALKPHOS 57 01/15/2016 1021   BILITOT 0.3 01/15/2016 1021   GFRNONAA 58 (L) 01/15/2016 1021   GFRAA 68 01/15/2016 1021     Diabetic Labs (most recent): Lab Results  Component Value Date   HGBA1C 6.0 (H) 01/15/2016   HGBA1C 6.1 (H) 10/06/2015   HGBA1C 7.2 (H) 06/25/2015     Lipid Panel ( most recent) Lipid Panel     Component Value Date/Time   CHOL 133 06/25/2015 0533  TRIG 175 (H) 06/25/2015 0533   HDL 26 (L) 06/25/2015 0533   CHOLHDL 5.1 06/25/2015 0533   VLDL 35 06/25/2015 0533   LDLCALC 72 06/25/2015 0533   LDLDIRECT 50 08/18/2014 0737      Assessment & Plan:   1. Type 2 diabetes mellitus with diabetic nephropathy, unspecified long term insulin use status (HCC) -His diabetes is  complicated by coronary artery disease, CVA, CKD and patient remains at a high risk for more acute and chronic complications of diabetes which include CAD, CVA, CKD, retinopathy, and neuropathy. These are all discussed in detail with the patient.  Patient came with his meter and log of glucose profile, and  recent A1c of 6% improving from 7.2 %.    Recent labs reviewed.  - I have re-counseled the patient on diet management and weight loss  by adopting a carbohydrate restricted / protein rich  Diet.  - Suggestion is made for patient to avoid simple carbohydrates   from their diet including Cakes , Desserts, Ice Cream,  Soda (  diet and regular) , Sweet Tea , Candies,  Chips, Cookies, Artificial Sweeteners,   and "Sugar-free" Products .  This will help patient to have stable blood glucose profile and potentially avoid unintended  Weight gain.  - Patient is advised to stick to a routine mealtimes to eat 3 meals  a day and avoid unnecessary snacks ( to  snack only to correct hypoglycemia).  - The patient  has been  scheduled with Jearld Fenton, RDN, CDE for individualized DM education.  - I have approached patient with the following individualized plan to manage diabetes and patient agrees.  - I will lower his premixed insulin Novolin 70/30 40 units with breakfast and 40 units with supper for pre-meal glucose of greater than 90 mg/dL. He is advised to continue strict  monitoring of glucose  AC and HS. - Patient is warned not to take insulin without proper monitoring per orders. -Adjustment parameters are given for hypo and hyperglycemia in writing. -Patient is encouraged to call clinic for blood glucose levels less than 70 or above 300 mg /dl. - I will continue metformin 500 mg by mouth twice a day, therapeutically suitable for patient.   - Patient specific target  for A1c; LDL, HDL, Triglycerides, and  Waist Circumference were discussed in detail.  2) BP/HTN: Controlled. Continue current medications including ACEI/ARB. 3) Lipids/HPL:  continue statins. 4)  Weight/Diet: CDE consult in progress, exercise, and carbohydrates information provided.  5) hypothyroidism: Clinically euthyroid. I will continue with levothyroxine 75 g by mouth every morning.  - We discussed about correct intake of levothyroxine, at fasting, with water, separated by at least 30 minutes from breakfast, and separated by more than 4 hours from calcium, iron, multivitamins, acid reflux medications (PPIs). -Patient is made aware of the fact that thyroid hormone replacement is needed for life, dose to be adjusted by periodic monitoring of thyroid function tests.  6) Chronic Care/Health Maintenance:  -Patient is on ACEI/ARB and Statin medications and encouraged to continue to follow up with Ophthalmology, Podiatrist at least yearly or according to recommendations, and advised to  stay away from smoking. I have recommended yearly flu vaccine and pneumonia vaccination at  least every 5 years; moderate intensity exercise for up to 150 minutes weekly; and  sleep for at least 7 hours a day. - I advised him to pick up compression stockings to wear on bilateral lower extremities during the times, to help with dependent edema  on bilateral lower extremities. - 25 minutes of time was spent on the care of this patient , 50% of which was applied for counseling on diabetes complications and their preventions.  - I advised patient to maintain close follow up with Tula Nakayama, MD for primary care needs.  Patient is asked to bring meter and  blood glucose logs during their next visit.   Follow up plan: -Return in about 3 months (around 06/24/2016) for follow up with pre-visit labs, meter, and logs.  Glade Lloyd, MD Phone: [phone]  Fax: [phone]   03/24/2016, 11:22 AM

## 2016-04-01 ENCOUNTER — Other Ambulatory Visit: Payer: Self-pay | Admitting: Licensed Clinical Social Worker

## 2016-04-01 NOTE — Patient Outreach (Signed)
Assessment:  CSW spoke via phone with [name], spouse of client, on 04/01/16. CSW verified identity of Godson Waites. CSW and [name] spoke of client needs. [name] transports client to and from client's scheduled medical appointments.  [name] said that client had prescribed medications and is taking medications as prescribed.  Client sees Dr. Moshe Cipro as primary care doctor. Client will have appointment with Dr. Moshe Cipro in January of 2018. Client uses a cane to help him ambulate.Client receives support from [place], [place] and [name] spoke of client care plan. CSW encouraged that client attend all scheduled client medical appointments in next 30 days. Client sees Dr. [name], endocrinologist, as scheduled.Client had an appointment with Dr. [name] last week. Dr. [name] had told client  said that client was doing well in managing sugar level of client.  Client was glad to hear this good report from Dr. [name] regarding client sugar level management.  Client is eating well and sleeping well. Client sees cardiologist as scheduled. Client has not had any recent falls. Client has not mentioned any pain issues at present. Client uses Bi-Pap machine to help him with breathing as he sleeps at night. Client relaxes by watching TV or by socializing with family and friends. [name] feels that [facility] program support is beneficial to client at present. CSW encouraged that client or Maylon Haberle call CSW at [phone] as needed to discuss social work needs of client.  CSW thanked Ryett Easter for phone conversation with CSW on 04/01/16.    Plan:  Client to attend all scheduled client medical appointments in next 30 days.  CSW to call client or Mykhael Swasey in 4 weeks to assess client needs.   [name].Kahner Yanik MSW, LCSW Licensed Clinical Social Worker [facility] Care Management [phone]

## 2016-04-07 ENCOUNTER — Other Ambulatory Visit: Payer: Self-pay | Admitting: Family Medicine

## 2016-04-09 ENCOUNTER — Other Ambulatory Visit: Payer: Self-pay

## 2016-04-09 MED ORDER — SPIRONOLACTONE 25 MG PO TABS: 25.0000 mg | ORAL_TABLET | Freq: Every day | ORAL | 1 refills | Status: DC

## 2016-04-09 MED ORDER — LEVETIRACETAM 500 MG PO TABS: 500.0000 mg | ORAL_TABLET | Freq: Two times a day (BID) | ORAL | 1 refills | Status: DC

## 2016-04-09 MED ORDER — PANTOPRAZOLE SODIUM 40 MG PO TBEC: DELAYED_RELEASE_TABLET | ORAL | 1 refills | Status: DC

## 2016-04-14 DIAGNOSIS — G4733 Obstructive sleep apnea (adult) (pediatric): Secondary | ICD-10-CM | POA: Diagnosis not present

## 2016-04-14 DIAGNOSIS — Z8673 Personal history of transient ischemic attack (TIA), and cerebral infarction without residual deficits: Secondary | ICD-10-CM | POA: Diagnosis not present

## 2016-04-18 ENCOUNTER — Other Ambulatory Visit: Payer: Self-pay

## 2016-04-18 NOTE — Patient Outreach (Addendum)
[facility]) Care Management  [place]  04/18/2016   [name] 09-Jul-1940 [id number]  Subjective: Telephone call to patient for monthly call. Patient reports he is doing good.  He denies any problems. Patient reports he is in the green zone with his breathing today. Discussed with patient COPD action plan and zones.  He verbalized understanding.  Discussed with patient about moving calls to every other month. Patient agrees.    Objective:   Encounter Medications:  Outpatient Encounter Prescriptions as of 04/18/2016  Medication Sig Note  . albuterol (PROVENTIL HFA;VENTOLIN HFA) 108 (90 Base) MCG/ACT inhaler Inhale 2 puffs into the lungs every 4 (four) hours as needed for wheezing or shortness of breath.   . allopurinol (ZYLOPRIM) 300 MG tablet TAKE 1 TABLET ONE TIME DAILY   . amLODipine (NORVASC) 10 MG tablet TAKE 1 TABLET EVERY DAY   . aspirin 325 MG tablet Take 1 tablet (325 mg total) by mouth daily.   . clopidogrel (PLAVIX) 75 MG tablet TAKE 1 TABLET EVERY DAY   . insulin NPH-regular Human (NOVOLIN 70/30) (70-30) 100 UNIT/ML injection INJECT 40 UNITS SUBCUTANEOUSLY TWICE DAILY   . levETIRAcetam (KEPPRA) 500 MG tablet Take 1 tablet (500 mg total) by mouth 2 (two) times daily.   Marland Kitchen levothyroxine (SYNTHROID, LEVOTHROID) 75 MCG tablet TAKE 1 TABLET EVERY MORNING   . lisinopril (PRINIVIL,ZESTRIL) 40 MG tablet TAKE 1 TABLET EVERY DAY   . metFORMIN (GLUCOPHAGE) 500 MG tablet TAKE 1 TABLET TWICE DAILY   . metoprolol (LOPRESSOR) 50 MG tablet Take 0.5 tablets (25 mg total) by mouth 2 (two) times daily.   . pantoprazole (PROTONIX) 40 MG tablet TAKE 1 TABLET EVERY DAY BEFORE BREAKFAST   . pravastatin (PRAVACHOL) 40 MG tablet TAKE 2 TABLETS EVERY DAY   . senna (SENOKOT) 8.6 MG tablet Take 1 tablet by mouth daily. 04/18/2016: Take as needed  . spironolactone (ALDACTONE) 25 MG tablet Take 1 tablet (25 mg total) by mouth daily.   Marland Kitchen tiotropium (SPIRIVA) 18 MCG inhalation  capsule Place 18 mcg into inhaler and inhale daily. Reported on 11/22/2015 10/25/2015: Patient reports doctor asked him to hold.   . vitamin B-12 (CYANOCOBALAMIN) 1000 MCG tablet Take 1,000 mcg by mouth daily.   Marland Kitchen acetaminophen (TYLENOL) 500 MG tablet Take 500 mg by mouth every 6 (six) hours as needed for moderate pain (left thigh).   . hydroxypropyl cellulose (LACRISERT) 5 MG INST Place 5 mg into the left eye at bedtime. (Patient not taking: Reported on 04/18/2016)   . triamcinolone cream (KENALOG) 0.1 % Apply 1 application topically 2 (two) times daily. Apply twice daily to rash on right elbow  , under a moist wrap for 4 weeks 04/18/2016: As needed   No facility-administered encounter medications on file as of 04/18/2016.     Functional Status:  In your present state of health, do you have any difficulty performing the following activities: 09/26/2015 09/26/2015  Hearing? N N  Vision? N N  Difficulty concentrating or making decisions? [name]  Walking or climbing stairs? Y Y  Dressing or bathing? Y Y  Doing errands, shopping? [name]  Preparing Food and eating ? Y N  Using the Toilet? N N  In the past six months, have you accidently leaked urine? Y Y  Do you have problems with loss of bowel control? N N  Managing your Medications? Y Y  Managing your Finances? [name]  Housekeeping or managing your Housekeeping? [name]  Some recent  data might be hidden    Fall/Depression Screening: PHQ 2/9 Scores 04/18/2016 03/24/2016 03/20/2016 02/19/2016 01/24/2016 12/27/2015 12/21/2015  PHQ - 2 Score 0 0 0 0 0 0 0    Assessment: Patient continues to benefit from health coach outreach for disease management and support.    Plan:  [facility] CM Care Plan Problem Two   Flowsheet Row Most Recent Value  Care Plan Problem Two  COPD, knowledge deficit  Role Documenting the Problem Two  [place] for Problem Two  Active  Interventions for Problem Two Long Term Goal   RN Health Coach reinforced with patient COPD action  plan zones and when to notify physician.    [facility] Long Term Goal (31-90) days  Patient will not have COPD exacerbation in the next 60 days  [facility] Long Term Goal Start Date  04/18/16 [facility] continued]     [place] will contact patient in the month of February and patient agrees to next outreach.  Jone Baseman, RN, MSN [place] [phone]

## 2016-05-01 ENCOUNTER — Other Ambulatory Visit: Payer: Self-pay | Admitting: Licensed Clinical Social Worker

## 2016-05-01 NOTE — Patient Outreach (Signed)
Assessment:  CSW spoke via phone with [name], spouse of client. . CSW verified identity of [name]. [name] and [name] spoke of client needs. [name] provides support to client. [name] transports client to and from client's scheduled medical appointments.  [name] said that client had prescribed medications and is taking medications as prescribed.  Client sees [name] as primary care doctor.  Client is scheduled to see [name] for medical appointment in January of 2018. Client receives support from [name], [name]. Client uses a cane to assist client in ambulation. CSW and [name] spoke of client care plan. CSW encouraged that client attend all scheduled client medical appointments in next 30 days  .Client sees [name], endocrinologist, as scheduled. Client is eating well and sleeping well. Client sees cardiologist as scheduled. Client has not had any recent falls. Client likes to fish for relaxation. He likes to watch TV for relaxation.  He enjoys listening to music to relax. Client is trying to attend all scheduled medical appointments for client.  CSW encouraged that client or [name] call CSW at [phone] as needed to discuss social work needs of client. [name] and client were very appreciative of call from [place] on 05/01/16.      Plan:  Client to attend all scheduled client medical appointments in next 30 days.  CSW to call client or [name] in 4 weeks to assess client needs.   [name].[name] MSW, LCSW Licensed Clinical Social Worker [facility] Care Management [phone]

## 2016-05-05 ENCOUNTER — Other Ambulatory Visit: Payer: Self-pay

## 2016-05-05 MED ORDER — GLUCOSE BLOOD VI STRP: ORAL_STRIP | 5 refills | Status: DC

## 2016-05-05 MED ORDER — INSULIN NPH ISOPHANE & REGULAR (70-30) 100 UNIT/ML ~~LOC~~ SUSP: SUBCUTANEOUS | 2 refills | Status: DC

## 2016-05-14 DIAGNOSIS — Z8673 Personal history of transient ischemic attack (TIA), and cerebral infarction without residual deficits: Secondary | ICD-10-CM | POA: Diagnosis not present

## 2016-05-14 DIAGNOSIS — G4733 Obstructive sleep apnea (adult) (pediatric): Secondary | ICD-10-CM | POA: Diagnosis not present

## 2016-05-21 DIAGNOSIS — E785 Hyperlipidemia, unspecified: Secondary | ICD-10-CM | POA: Diagnosis not present

## 2016-05-21 DIAGNOSIS — I2510 Atherosclerotic heart disease of native coronary artery without angina pectoris: Secondary | ICD-10-CM | POA: Diagnosis not present

## 2016-05-21 DIAGNOSIS — E119 Type 2 diabetes mellitus without complications: Secondary | ICD-10-CM | POA: Diagnosis not present

## 2016-05-21 DIAGNOSIS — I10 Essential (primary) hypertension: Secondary | ICD-10-CM | POA: Diagnosis not present

## 2016-05-23 ENCOUNTER — Encounter: Payer: Self-pay | Admitting: Licensed Clinical Social Worker

## 2016-05-23 ENCOUNTER — Other Ambulatory Visit: Payer: Self-pay | Admitting: Licensed Clinical Social Worker

## 2016-05-23 NOTE — Patient Outreach (Signed)
Assessment:  CSW spoke via phone with Ginny Forth, spouse of client. CSW verified identity of [name]. [name] and [place] spoke of client needs. Client has prescribed medications and is taking medications as prescribed. Andreas Sobolewski transports client to and from client's scheduled medical appointments.  Client sees Dr. Moshe Cipro as primary care doctor.  Client is currently receiving support with [place], [name] RN.  Client uses a cane to assist client with ambulation. Client sees Dr. [name], endocrinologist, as scheduled. Client sees cardiologist as scheduled. CSW informed Lydia Meng that client had now met client's care plan goals with [facility] CSW services. Thus, CSW would discharge client from  [place] services on 05/23/16 since client had met client's care plan goals with [facility] CSW services. [name] agreed to this plan. She said she was appreciative of support of [facility] CSW in recent months.  She also said she is appreciative of help client is receiving with [place], [name], RN.  Donelle Baba agreed to client discharge on 05/23/16 from [place] services only.     Plan:  CSW is discharging Boardman Trice from [place] services on 05/23/16 since client has met care plan goals for client with CSW services.   CSW to inform Josepha Pigg that [place] discharged client on 05/23/16 from [place] services only.  CSW to fax letter to Dr. Moshe Cipro informing Dr. Moshe Cipro that [place] discharged client from [place] services only on 05/23/16.   [name].Temeka Pore MSW, LCSW Licensed Clinical Social Worker [facility] Care Management [phone]

## 2016-06-03 ENCOUNTER — Ambulatory Visit: Payer: Commercial Managed Care - HMO | Admitting: Licensed Clinical Social Worker

## 2016-06-03 ENCOUNTER — Other Ambulatory Visit: Payer: Self-pay | Admitting: "Endocrinology

## 2016-06-03 ENCOUNTER — Other Ambulatory Visit: Payer: Self-pay | Admitting: Family Medicine

## 2016-06-05 ENCOUNTER — Other Ambulatory Visit: Payer: Self-pay | Admitting: "Endocrinology

## 2016-06-08 ENCOUNTER — Encounter (HOSPITAL_COMMUNITY): Payer: Self-pay | Admitting: *Deleted

## 2016-06-08 DIAGNOSIS — I2510 Atherosclerotic heart disease of native coronary artery without angina pectoris: Secondary | ICD-10-CM | POA: Diagnosis not present

## 2016-06-08 DIAGNOSIS — Z79899 Other long term (current) drug therapy: Secondary | ICD-10-CM | POA: Diagnosis not present

## 2016-06-08 DIAGNOSIS — E039 Hypothyroidism, unspecified: Secondary | ICD-10-CM | POA: Insufficient documentation

## 2016-06-08 DIAGNOSIS — E1122 Type 2 diabetes mellitus with diabetic chronic kidney disease: Secondary | ICD-10-CM | POA: Insufficient documentation

## 2016-06-08 DIAGNOSIS — I129 Hypertensive chronic kidney disease with stage 1 through stage 4 chronic kidney disease, or unspecified chronic kidney disease: Secondary | ICD-10-CM | POA: Diagnosis not present

## 2016-06-08 DIAGNOSIS — Z955 Presence of coronary angioplasty implant and graft: Secondary | ICD-10-CM | POA: Diagnosis not present

## 2016-06-08 DIAGNOSIS — E11649 Type 2 diabetes mellitus with hypoglycemia without coma: Secondary | ICD-10-CM | POA: Diagnosis not present

## 2016-06-08 DIAGNOSIS — Z7982 Long term (current) use of aspirin: Secondary | ICD-10-CM | POA: Diagnosis not present

## 2016-06-08 DIAGNOSIS — J441 Chronic obstructive pulmonary disease with (acute) exacerbation: Secondary | ICD-10-CM | POA: Insufficient documentation

## 2016-06-08 DIAGNOSIS — Z794 Long term (current) use of insulin: Secondary | ICD-10-CM | POA: Diagnosis not present

## 2016-06-08 DIAGNOSIS — E10649 Type 1 diabetes mellitus with hypoglycemia without coma: Secondary | ICD-10-CM | POA: Diagnosis not present

## 2016-06-08 DIAGNOSIS — N182 Chronic kidney disease, stage 2 (mild): Secondary | ICD-10-CM | POA: Diagnosis not present

## 2016-06-08 DIAGNOSIS — Z87891 Personal history of nicotine dependence: Secondary | ICD-10-CM | POA: Diagnosis not present

## 2016-06-08 DIAGNOSIS — R410 Disorientation, unspecified: Secondary | ICD-10-CM | POA: Insufficient documentation

## 2016-06-08 LAB — CBG MONITORING, ED: Glucose-Capillary: 38 mg/dL — CL (ref 65–99)

## 2016-06-08 MED ORDER — DEXTROSE 50 % IV SOLN
INTRAVENOUS | Status: AC
  Administered 2016-06-09
  Filled 2016-06-08: qty 50

## 2016-06-08 NOTE — ED Triage Notes (Signed)
Pt's spouse states pt began acting confused and when she checked his sugar at home it was 50, she gave him peanut butter; pt is drowsy and acts confused during triage

## 2016-06-09 ENCOUNTER — Emergency Department (HOSPITAL_COMMUNITY): Payer: Medicare HMO

## 2016-06-09 ENCOUNTER — Emergency Department (HOSPITAL_COMMUNITY)
Admission: EM | Admit: 2016-06-09 | Discharge: 2016-06-09 | Disposition: A | Discharge status: Home / Self Care | Payer: Medicare HMO | Attending: Emergency Medicine | Admitting: Emergency Medicine

## 2016-06-09 DIAGNOSIS — E162 Hypoglycemia, unspecified: Secondary | ICD-10-CM

## 2016-06-09 DIAGNOSIS — R410 Disorientation, unspecified: Secondary | ICD-10-CM | POA: Diagnosis not present

## 2016-06-09 LAB — CBC WITH DIFFERENTIAL/PLATELET
Basophils Absolute: 0.1 10*3/uL (ref 0.0–0.1)
Basophils Relative: 1 %
EOS ABS: 0.2 10*3/uL (ref 0.0–0.7)
Eosinophils Relative: 2 %
HCT: 32.4 % — ABNORMAL LOW (ref 39.0–52.0)
HEMOGLOBIN: 10.5 g/dL — AB (ref 13.0–17.0)
LYMPHS ABS: 2 10*3/uL (ref 0.7–4.0)
Lymphocytes Relative: 17 %
MCH: 27.2 pg (ref 26.0–34.0)
MCHC: 32.4 g/dL (ref 30.0–36.0)
MCV: 83.9 fL (ref 78.0–100.0)
Monocytes Absolute: 1 10*3/uL (ref 0.1–1.0)
Monocytes Relative: 8 %
NEUTROS PCT: 72 %
Neutro Abs: 8.8 10*3/uL — ABNORMAL HIGH (ref 1.7–7.7)
Platelets: 286 10*3/uL (ref 150–400)
RBC: 3.86 MIL/uL — ABNORMAL LOW (ref 4.22–5.81)
RDW: 15.3 % (ref 11.5–15.5)
WBC: 12.1 10*3/uL — AB (ref 4.0–10.5)

## 2016-06-09 LAB — COMPREHENSIVE METABOLIC PANEL
ALK PHOS: 53 U/L (ref 38–126)
ALT: 7 U/L — AB (ref 17–63)
AST: 11 U/L — AB (ref 15–41)
Albumin: 3.6 g/dL (ref 3.5–5.0)
Anion gap: 9 (ref 5–15)
BUN: 17 mg/dL (ref 6–20)
CALCIUM: 9.1 mg/dL (ref 8.9–10.3)
CO2: 27 mmol/L (ref 22–32)
CREATININE: 1.16 mg/dL (ref 0.61–1.24)
Chloride: 102 mmol/L (ref 101–111)
GFR calc non Af Amer: >60 mL/min (ref >60)
GLUCOSE: 68 mg/dL (ref 65–99)
Potassium: 3.8 mmol/L (ref 3.5–5.1)
SODIUM: 138 mmol/L (ref 135–145)
Total Bilirubin: 0.4 mg/dL (ref 0.3–1.2)
Total Protein: 7.2 g/dL (ref 6.5–8.1)

## 2016-06-09 LAB — CBG MONITORING, ED
GLUCOSE-CAPILLARY: 136 mg/dL — AB (ref 65–99)
Glucose-Capillary: 111 mg/dL — ABNORMAL HIGH (ref 65–99)
Glucose-Capillary: 127 mg/dL — ABNORMAL HIGH (ref 65–99)
Glucose-Capillary: 144 mg/dL — ABNORMAL HIGH (ref 65–99)
Glucose-Capillary: 60 mg/dL — ABNORMAL LOW (ref 65–99)

## 2016-06-09 LAB — TROPONIN I: Troponin I: <0.03 ng/mL (ref <0.03)

## 2016-06-09 MED ORDER — DEXTROSE 50 % IV SOLN
50.0000 mL | Freq: Once | INTRAVENOUS | Status: AC
  Administered 2016-06-09: 50 mL via INTRAVENOUS

## 2016-06-09 NOTE — ED Notes (Signed)
Pt was requesting to hang his legs on the side of the bed and eat his meal sitting on the side of the bed. Pt turned his head quickly to the right and had a dizzy spell. EDP made aware.

## 2016-06-09 NOTE — ED Notes (Signed)
Pt states he needs the wheelchair to go to bathroom; pt encouraged to use urinal because he is having some dizziness; pt given urinal

## 2016-06-09 NOTE — ED Provider Notes (Signed)
[facility] DEPT Provider Note   CSN: [id number] Arrival date & time: 06/08/16  2337 By signing my name below, I, [name], attest that this documentation has been prepared under the direction and in the presence of John Essex, MD . Electronically Signed: Dyke [name], Scribe. 06/09/2016. 12:11 AM.   History   Chief Complaint Chief Complaint  Patient presents with  . Hypoglycemia   HPI [name] is a 76 y.o. male with a hx of DM, CAD, and CVA who presents to the Emergency Department complaining of hypoglycemia tonight PTA. Pt's wife reports associated confusion and states he began acting "loopy" before bed just PTA. She then checked his blood sugar which was 50. He states he has been complaint with his medication and took his insulin tonight at 6 pm. Per wife, pt had been acting normally today until this began. He is not on blood thinners or pain medication. No recent illness. Pt denies cough, fever, rhinorrhea, dizziness, sore throat, SOB, vomiting, diarrhea, nausea or any pain. Pt has no other complaints or symptoms at this time.   The history is provided by the patient. No language interpreter was used.   Past Medical History:  Diagnosis Date  . Abnormality of gait 05/01/2015  . Arthritis    "left leg" (08/30/2013)  . Arthritis of knee   . CAD (coronary artery disease)   . Chronic back pain   . Chronic bronchitis ([place])    "get it q yr"  . COPD (chronic obstructive pulmonary disease) ([place])   . CVA (cerebral infarction)   . Diabetes mellitus, type 1   . GERD (gastroesophageal reflux disease)   . Gout   . Hyperlipidemia   . Hypertension   . Myocardial infarction 1999  . Obesity   . On home oxygen therapy    "2L; only at night" (08/30/2013)  . Pneumonia    "a few times; last time was ~ 06/2013"  . Psoriasis   . Stroke ([place])   . Tussive syncope 05/01/2015  . Vitamin B12 deficiency 05/01/2015    Patient Active Problem List   Diagnosis Date Noted  .  Choking 07/13/2015  . History of CVA (cerebrovascular accident) 06/20/2015  . CKD (chronic kidney disease) stage 2, GFR 60-89 ml/min 06/04/2015  . Hypothyroidism 06/04/2015  . Back pain 06/01/2015  . Facial droop 05/15/2015  . Dysarthria 05/15/2015  . Partial seizure ([place]) 05/12/2015  . Right hemiparesis ([place]) 05/12/2015  . Stroke ([place]) 05/10/2015  . GERD (gastroesophageal reflux disease) 05/10/2015  . CVA (cerebral infarction) 05/10/2015  . Vitamin B12 deficiency 05/01/2015  . Abnormality of gait 05/01/2015  . Tussive syncope 05/01/2015  . Recurrent falls 04/29/2015  . Dysphagia 04/26/2015  . Near syncope 04/26/2015  . COPD exacerbation ([place]) 04/19/2015  . Anemia 04/19/2015  . COPD with acute exacerbation ([place]) 02/11/2015  . Carotid bruit 12/08/2013  . Angina pectoris ([place]) 08/30/2013  . Type 2 diabetes mellitus with diabetic nephropathy ([place]) 05/22/2013  . Onychomycosis of toenail 05/22/2013  . COPD (chronic obstructive pulmonary disease) ([place]) 11/22/2012  . Sleep related hypoxia 08/30/2012  . Vitamin D deficiency 12/23/2011  . PSORIASIS 07/18/2010  . Gout 01/13/2010  . Type 2 diabetes mellitus with polyneuropathy ([place]) 11/28/2008  . Hyperlipidemia 10/05/2007  . Obesity (BMI 30.0-34.9) 10/05/2007  . Essential hypertension 10/05/2007  . Coronary atherosclerosis 10/05/2007  . GERD 10/05/2007  . Arthritis of knee, right 10/05/2007    Past Surgical History:  Procedure Laterality Date  . APPENDECTOMY    .  COLONOSCOPY  03/17/2012   Procedure: COLONOSCOPY;  Surgeon: Rogene Houston, MD;  Location: [facility] ENDO SUITE;  Service: Endoscopy;  Laterality: N/A;  830  . CORONARY ANGIOPLASTY WITH STENT PLACEMENT  1999- 2009-08/30/2013   "counting today's, I have 5 stents" (08/30/2013)  . CYSTOSCOPY WITH URETHRAL DILATATION N/A 11/26/2012   Procedure: CYSTOSCOPY WITH URETHRAL DILATATION;  Surgeon: Malka So, MD;  Location: [facility] ORS;  Service: Urology;  Laterality: N/A;  . LEFT HEART  CATHETERIZATION WITH CORONARY ANGIOGRAM N/A 08/30/2013   Procedure: LEFT HEART CATHETERIZATION WITH CORONARY ANGIOGRAM;  Surgeon: Clent Demark, MD;  Location: [facility] CATH LAB;  Service: Cardiovascular;  Laterality: N/A;  . PERCUTANEOUS CORONARY STENT INTERVENTION (PCI-S) Right 08/30/2013   Procedure: PERCUTANEOUS CORONARY STENT INTERVENTION (PCI-S);  Surgeon: Clent Demark, MD;  [facility] C[facility] LAB;  Service: Cardiovascular;  Laterality: Right;     Home Medications    Prior to Admission medications   Medication Sig Start Date End Date Taking? Authorizing Provider  ACCU-CHEK AVIVA PLUS test strip FOUR TIMES A DAY 06/06/16   Cassandria Anger, MD  acetaminophen (TYLENOL) 500 MG tablet Take 500 mg by mouth every 6 (six) hours as needed for moderate pain (left thigh).    Historical Provider, MD  albuterol (PROVENTIL HFA;VENTOLIN HFA) 108 (90 Base) MCG/ACT inhaler Inhale 2 puffs into the lungs every 4 (four) hours as needed for wheezing or shortness of breath. 06/22/15   Kristen N Ward, DO  allopurinol (ZYLOPRIM) 300 MG tablet TAKE 1 TABLET ONE TIME DAILY 09/18/15   Sanjuana Kava, MD  amLODipine (NORVASC) 10 MG tablet TAKE 1 TABLET EVERY DAY 06/06/16   Fayrene Helper, MD  aspirin 325 MG tablet Take 1 tablet (325 mg total) by mouth daily. 05/12/15   Kathie Dike, MD  clopidogrel (PLAVIX) 75 MG tablet TAKE 1 TABLET EVERY DAY 04/08/16   Fayrene Helper, MD  hydroxypropyl cellulose (LACRISERT) 5 MG INST Place 5 mg into the left eye at bedtime. Patient not taking: Reported on 04/18/2016 08/03/15   Fayrene Helper, MD  insulin NPH-regular Human (NOVOLIN 70/30) (70-30) 100 UNIT/ML injection INJECT 40 UNITS SUBCUTANEOUSLY TWICE DAILY 05/05/16   Cassandria Anger, MD  levETIRAcetam (KEPPRA) 500 MG tablet Take 1 tablet (500 mg total) by mouth 2 (two) times daily. 04/09/16   Fayrene Helper, MD  levothyroxine (SYNTHROID, LEVOTHROID) 75 MCG tablet TAKE 1 TABLET EVERY MORNING 01/01/16    Cassandria Anger, MD  lisinopril (PRINIVIL,ZESTRIL) 40 MG tablet TAKE 1 TABLET EVERY DAY 06/06/16   Fayrene Helper, MD  metFORMIN (GLUCOPHAGE) 500 MG tablet TAKE 1 TABLET TWICE DAILY 06/06/16   Cassandria Anger, MD  metoprolol (LOPRESSOR) 50 MG tablet Take 0.5 tablets (25 mg total) by mouth 2 (two) times daily. 09/20/15   Fayrene Helper, MD  pantoprazole (PROTONIX) 40 MG tablet TAKE 1 TABLET EVERY DAY BEFORE BREAKFAST 04/09/16   Fayrene Helper, MD  pravastatin (PRAVACHOL) 40 MG tablet TAKE 2 TABLETS EVERY DAY 04/08/16   Fayrene Helper, MD  senna (SENOKOT) 8.6 MG tablet Take 1 tablet by mouth daily.    Historical Provider, MD  spironolactone (ALDACTONE) 25 MG tablet Take 1 tablet (25 mg total) by mouth daily. 04/09/16   Fayrene Helper, MD  tiotropium (SPIRIVA) 18 MCG inhalation capsule Place 18 mcg into inhaler and inhale daily. Reported on 11/22/2015    Historical Provider, MD  triamcinolone cream (KENALOG) 0.1 % Apply 1 application topically 2 (two) times daily. Apply  twice daily to rash on right elbow  , under a moist wrap for 4 weeks 09/19/15   Fayrene Helper, MD  vitamin B-12 (CYANOCOBALAMIN) 1000 MCG tablet Take 1,000 mcg by mouth daily.    Historical Provider, MD    Family History Family History  Problem Relation Age of Onset  . Hypertension Mother   . Diabetes Mother   . Hypertension Father   . Diabetes Brother   . Stroke Daughter   . Arthritis      Family History   . Diabetes      family History     Social History Social History  Substance Use Topics  . Smoking status: Former Smoker    Packs/day: 0.50    Years: 33.00    Types: Cigarettes  . Smokeless tobacco: Never Used     Comment: "stopped smoking in the 1990's  . Alcohol use No   Allergies   Patient has no known allergies.  Review of Systems Review of Systems 10 systems reviewed and all are negative for acute change except as noted in the HPI.  Physical Exam Updated Vital Signs BP  119/62   Pulse 80   Temp 97.4 F (36.3 C) (Oral)   Resp 16   SpO2 92%   Physical Exam  Constitutional: He is oriented to person, place, and time. He appears well-developed and well-nourished. No distress.  Obese, mildly confused but arouses and answers questions appropriately  HENT:  Head: Normocephalic and atraumatic.  Mouth/Throat: Oropharynx is clear and moist. No oropharyngeal exudate.  Eyes: Conjunctivae and EOM are normal. Pupils are equal, round, and reactive to light.  Neck: Normal range of motion. Neck supple.  No meningismus.  Cardiovascular: Normal rate, regular rhythm, normal heart sounds and intact distal pulses.   No murmur heard. Pulmonary/Chest: Effort normal and breath sounds normal. No respiratory distress.  Abdominal: Soft. There is no tenderness. There is no rebound and no guarding.  Musculoskeletal: Normal range of motion. He exhibits no edema or tenderness.  Neurological: He is alert and oriented to person, place, and time. No cranial nerve deficit. He exhibits normal muscle tone. Coordination normal.   5/5 strength throughout. CN 2-12 intact.Equal grip strength. Gait not tested No ataxia on finger to nose. No pronator drift.   Skin: Skin is warm.  Psychiatric: He has a normal mood and affect. His behavior is normal.  Nursing note and vitals reviewed.   ED Treatments / Results  DIAGNOSTIC STUDIES:  Oxygen Saturation is 96% on RA, normal by my interpretation.    COORDINATION OF CARE:  12:04 AM Discussed treatment plan with pt at bedside and pt agreed to plan.  Labs (all labs ordered are listed, but only abnormal results are displayed) Labs Reviewed  CBC WITH DIFFERENTIAL/PLATELET - Abnormal; Notable for the following:       Result Value   WBC 12.1 (*)    RBC 3.86 (*)    Hemoglobin 10.5 (*)    HCT 32.4 (*)    Neutro Abs 8.8 (*)    All other components within normal limits  COMPREHENSIVE METABOLIC PANEL - Abnormal; Notable for the following:    AST  11 (*)    ALT 7 (*)    All other components within normal limits  CBG MONITORING, ED - Abnormal; Notable for the following:    Glucose-Capillary 38 (*)    All other components within normal limits  CBG MONITORING, ED - Abnormal; Notable for the following:    Glucose-Capillary 144 (*)  All other components within normal limits  CBG MONITORING, ED - Abnormal; Notable for the following:    Glucose-Capillary 60 (*)    All other components within normal limits  CBG MONITORING, ED - Abnormal; Notable for the following:    Glucose-Capillary 111 (*)    All other components within normal limits  CBG MONITORING, ED - Abnormal; Notable for the following:    Glucose-Capillary 127 (*)    All other components within normal limits  CBG MONITORING, ED - Abnormal; Notable for the following:    Glucose-Capillary 136 (*)    All other components within normal limits  TROPONIN I    EKG  EKG Interpretation  Date/Time:  Monday June 09 2016 00:15:17 EST Ventricular Rate:  76 PR Interval:    QRS Duration: 104 QT Interval:  423 QTC Calculation: 476 R Axis:   19 Text Interpretation:  Sinus rhythm Low voltage, extremity and precordial leads Borderline prolonged QT interval No significant change was found Confirmed by Wyvonnia Dusky  MD, Claudy Abdallah [phone]) on 06/09/2016 12:22:12 AM       Radiology Ct Head Wo Contrast  Result Date: 06/09/2016 CLINICAL DATA:  Confusion.  Hypoglycemia. EXAM: CT HEAD WITHOUT CONTRAST TECHNIQUE: Contiguous axial images were obtained from the base of the skull through the vertex without intravenous contrast. COMPARISON:  06/24/2015 head CT FINDINGS: Brain: Bilateral chronic thalamic, left lentiform and faint pontine lacunar infarcts are noted. There is mild central and superficial atrophy with periventricular white matter chronic small vessel ischemic disease. No acute large vascular territory infarcts. Vascular: Moderate carotid siphon and bilateral vertebral atherosclerosis.  Skull: Nonacute Sinuses/Orbits: Nonacute Other: Non IMPRESSION: Mild atrophy with chronic small vessel ischemic disease of periventricular white matter. Bilateral chronic thalamic, left lentiform and faint pontine lacunar infarcts. Electronically Signed   By: Ashley Royalty M.D.   On: 06/09/2016 03:05    Procedures Procedures (including critical care time)  Medications Ordered in ED Medications  dextrose 50 % solution (not administered)    Initial Impression / Assessment and Plan / ED Course  I have reviewed the triage vital signs and the nursing notes.  Pertinent labs & imaging results that were available during my care of the patient were reviewed by me and considered in my medical decision making (see chart for details).     Patient from home with acute onset of confusion and hypoglycemia in the 50s. On arrival patient's blood sugar 38. He is given D50.  Patient reports he's been eating and drinking normally. Takes his usual insulin dose in the morning and at night. Also is on metformin. No recent medication changes. No fever or recent illnesses.  Patient observed in the ED. His sugar did drop into the 60s. He was given a meal. Patient has had 3 blood sugars greater than 100. He feels at his baseline. He has some slurred speech but his wife states this is baseline. CT head shows chronic infarcts.  Patient admits to taking his insulin tonight but not eating dinner. He is tolerating by mouth and ambulatory. His wife agrees that he is at his baseline.   Discussed with patient to always eat when he takes his insulin. No infectious symptoms. Follow-up with PCP. Return precautions discussed.    Final Clinical Impressions(s) / ED Diagnoses   Final diagnoses:  Hypoglycemia    New Prescriptions New Prescriptions   No medications on file  I personally performed the services described in this documentation, which was scribed in my presence. The recorded information has  been reviewed and  is accurate.    John Essex, MD 06/09/16 [phone]

## 2016-06-09 NOTE — ED Notes (Signed)
Pt denies dizziness at this time.

## 2016-06-09 NOTE — Discharge Instructions (Signed)
Ensure that you eat when taking your insulin. Followup with your doctor. Return to the ED if you develop new or worsening symptoms.

## 2016-06-14 DIAGNOSIS — G4733 Obstructive sleep apnea (adult) (pediatric): Secondary | ICD-10-CM | POA: Diagnosis not present

## 2016-06-14 DIAGNOSIS — Z8673 Personal history of transient ischemic attack (TIA), and cerebral infarction without residual deficits: Secondary | ICD-10-CM | POA: Diagnosis not present

## 2016-06-19 ENCOUNTER — Other Ambulatory Visit: Payer: Self-pay

## 2016-06-19 NOTE — Patient Outreach (Addendum)
[facility]) Care Management  [place]  06/19/2016   [name] May 30, 1940 [id number]  Subjective: Telephone call to patient for every other month call.  Patient reports he is doing good.  Asked patient about his ED visit for hypoglycemia.  Patient reports that he took his insulin without eating and his sugar dropped.  Discussed with patient the importance of eating when he is taking his insulin.  He verbalized understanding.  He asked question about [facility] transportation. He states that his wife is thinking about going back to work and that he might need transportation to appointments. Advised patient that I would let Theadore Nan social worker know about inquiry so that he can assist.  He verbalized understanding.  Patient reports that his breathing has been ok.  He reports some times with shortness of breath but uses inhaler and does well.  Discussed with patient COPD and when to notify physician. He verbalized understanding.   Objective:   Encounter Medications:  Outpatient Encounter Prescriptions as of 06/19/2016  Medication Sig Note  . ACCU-CHEK AVIVA PLUS test strip FOUR TIMES A DAY   . acetaminophen (TYLENOL) 500 MG tablet Take 500 mg by mouth every 6 (six) hours as needed for moderate pain (left thigh).   Marland Kitchen albuterol (PROVENTIL HFA;VENTOLIN HFA) 108 (90 Base) MCG/ACT inhaler Inhale 2 puffs into the lungs every 4 (four) hours as needed for wheezing or shortness of breath.   . allopurinol (ZYLOPRIM) 300 MG tablet TAKE 1 TABLET ONE TIME DAILY   . amLODipine (NORVASC) 10 MG tablet TAKE 1 TABLET EVERY DAY   . aspirin 325 MG tablet Take 1 tablet (325 mg total) by mouth daily.   . clopidogrel (PLAVIX) 75 MG tablet TAKE 1 TABLET EVERY DAY   . insulin NPH-regular Human (NOVOLIN 70/30) (70-30) 100 UNIT/ML injection INJECT 40 UNITS SUBCUTANEOUSLY TWICE DAILY   . levETIRAcetam (KEPPRA) 500 MG tablet Take 1 tablet (500 mg total) by mouth 2 (two) times daily.   Marland Kitchen  levothyroxine (SYNTHROID, LEVOTHROID) 75 MCG tablet TAKE 1 TABLET EVERY MORNING   . lisinopril (PRINIVIL,ZESTRIL) 40 MG tablet TAKE 1 TABLET EVERY DAY   . metFORMIN (GLUCOPHAGE) 500 MG tablet TAKE 1 TABLET TWICE DAILY   . metoprolol (LOPRESSOR) 50 MG tablet Take 0.5 tablets (25 mg total) by mouth 2 (two) times daily.   . pantoprazole (PROTONIX) 40 MG tablet TAKE 1 TABLET EVERY DAY BEFORE BREAKFAST   . pravastatin (PRAVACHOL) 40 MG tablet TAKE 2 TABLETS EVERY DAY   . senna (SENOKOT) 8.6 MG tablet Take 1 tablet by mouth daily. 04/18/2016: Take as needed  . spironolactone (ALDACTONE) 25 MG tablet Take 1 tablet (25 mg total) by mouth daily.   Marland Kitchen tiotropium (SPIRIVA) 18 MCG inhalation capsule Place 18 mcg into inhaler and inhale daily. Reported on 11/22/2015 10/25/2015: Patient reports doctor asked him to hold.   . triamcinolone cream (KENALOG) 0.1 % Apply 1 application topically 2 (two) times daily. Apply twice daily to rash on right elbow  , under a moist wrap for 4 weeks 04/18/2016: As needed  . vitamin B-12 (CYANOCOBALAMIN) 1000 MCG tablet Take 1,000 mcg by mouth daily.   . hydroxypropyl cellulose (LACRISERT) 5 MG INST Place 5 mg into the left eye at bedtime. (Patient not taking: Reported on 04/18/2016)    No facility-administered encounter medications on file as of 06/19/2016.     Functional Status:  In your present state of health, do you have any difficulty performing the following activities:  09/26/2015 09/26/2015  Hearing? N N  Vision? N N  Difficulty concentrating or making decisions? [name]  Walking or climbing stairs? Y Y  Dressing or bathing? Y Y  Doing errands, shopping? [name]  Preparing Food and eating ? Y N  Using the Toilet? N N  In the past six months, have you accidently leaked urine? Y Y  Do you have problems with loss of bowel control? N N  Managing your Medications? Y Y  Managing your Finances? [name]  Housekeeping or managing your Housekeeping? [name]  Some recent data might be hidden     Fall/Depression Screening: PHQ 2/9 Scores 06/19/2016 04/18/2016 03/24/2016 03/20/2016 02/19/2016 01/24/2016 12/27/2015  PHQ - 2 Score 0 0 0 0 0 0 0    Assessment: Patient continues to benefit from health coach outreach for disease management and support.    Plan:  [facility] CM Care Plan Problem Two   Flowsheet Row Most Recent Value  Care Plan Problem Two  COPD, knowledge deficit  Role Documenting the Problem Two  [place] for Problem Two  Active  Interventions for Problem Two Long Term Goal   RN Health Coach reviewed with patient COPD action plan zones and when to notify physician.    [facility] Long Term Goal (31-90) days  Patient will not have COPD exacerbation in the next 60 days  [facility] Long Term Goal Start Date  06/19/16 [facility] continued]      [place] will contact patient in the month of April and patient agrees to next outreach.  Jone Baseman, RN, MSN [place] [phone]

## 2016-06-24 ENCOUNTER — Ambulatory Visit: Payer: Commercial Managed Care - HMO | Admitting: "Endocrinology

## 2016-07-01 ENCOUNTER — Ambulatory Visit (INDEPENDENT_AMBULATORY_CARE_PROVIDER_SITE_OTHER): Payer: Medicare HMO

## 2016-07-01 ENCOUNTER — Ambulatory Visit (INDEPENDENT_AMBULATORY_CARE_PROVIDER_SITE_OTHER): Payer: Medicare HMO | Admitting: Family Medicine

## 2016-07-01 ENCOUNTER — Encounter: Payer: Self-pay | Admitting: Family Medicine

## 2016-07-01 VITALS — BP 120/78 | HR 78 | Resp 17 | Ht 70.0 in | Wt 247.0 lb

## 2016-07-01 VITALS — BP 120/78 | HR 78 | Temp 97.9°F | Ht 70.0 in | Wt 247.0 lb

## 2016-07-01 DIAGNOSIS — E559 Vitamin D deficiency, unspecified: Secondary | ICD-10-CM | POA: Diagnosis not present

## 2016-07-01 DIAGNOSIS — Z0000 Encounter for general adult medical examination without abnormal findings: Secondary | ICD-10-CM

## 2016-07-01 DIAGNOSIS — G8191 Hemiplegia, unspecified affecting right dominant side: Secondary | ICD-10-CM

## 2016-07-01 DIAGNOSIS — I10 Essential (primary) hypertension: Secondary | ICD-10-CM

## 2016-07-01 DIAGNOSIS — L408 Other psoriasis: Secondary | ICD-10-CM

## 2016-07-01 DIAGNOSIS — E782 Mixed hyperlipidemia: Secondary | ICD-10-CM | POA: Diagnosis not present

## 2016-07-01 DIAGNOSIS — E1121 Type 2 diabetes mellitus with diabetic nephropathy: Secondary | ICD-10-CM

## 2016-07-01 DIAGNOSIS — I2510 Atherosclerotic heart disease of native coronary artery without angina pectoris: Secondary | ICD-10-CM

## 2016-07-01 DIAGNOSIS — E038 Other specified hypothyroidism: Secondary | ICD-10-CM

## 2016-07-01 DIAGNOSIS — E669 Obesity, unspecified: Secondary | ICD-10-CM

## 2016-07-01 MED ORDER — PANTOPRAZOLE SODIUM 40 MG PO TBEC: DELAYED_RELEASE_TABLET | ORAL | 1 refills | Status: DC

## 2016-07-01 MED ORDER — METOPROLOL TARTRATE 50 MG PO TABS: 25.0000 mg | ORAL_TABLET | Freq: Two times a day (BID) | ORAL | 1 refills | Status: DC

## 2016-07-01 MED ORDER — CLOPIDOGREL BISULFATE 75 MG PO TABS: 75.0000 mg | ORAL_TABLET | Freq: Every day | ORAL | 1 refills | Status: DC

## 2016-07-01 MED ORDER — LEVETIRACETAM 500 MG PO TABS: 500.0000 mg | ORAL_TABLET | Freq: Two times a day (BID) | ORAL | 1 refills | Status: DC

## 2016-07-01 MED ORDER — PRAVASTATIN SODIUM 40 MG PO TABS: 80.0000 mg | ORAL_TABLET | Freq: Every day | ORAL | 1 refills | Status: DC

## 2016-07-01 MED ORDER — SPIRONOLACTONE 25 MG PO TABS: 25.0000 mg | ORAL_TABLET | Freq: Every day | ORAL | 1 refills | Status: DC

## 2016-07-01 NOTE — Patient Instructions (Addendum)
Annual physical exam in 5 month, call if you need me before  Vit d, lipid  at same time you get blood work for Dr [name]   Thank you  for choosing [facility]. We consider it a privelige to serve you.  Delivering excellent health care in a caring and  compassionate way is our goal.  Partnering with you,  so that together we can achieve this goal is our strategy.

## 2016-07-01 NOTE — Patient Instructions (Addendum)
Health maintenance: You are due for your flu shot, recommend you consider taking this yearly. You are also due for your Tdap and shingles vaccine.  If you change your mind about any of these vaccines please call our office and schedule a nurse visit so we can administer these.   Abnormal screenings: None   Patient concerns: None   Nurse concerns: Obesity, recommend chair exercises at least 3 times a week for 30 minutes at a time as tolerated.   Next PCP appt: Today with Dr. Moshe Cipro. Please follow up in 1 year for your annual wellness visit.    Preventive Care 24 Years and Older, Male Preventive care refers to lifestyle choices and visits with your health care provider that can promote health and wellness. What does preventive care include?  A yearly physical exam. This is also called an annual well check.  Dental exams once or twice a year.  Routine eye exams. Ask your health care provider how often you should have your eyes checked.  Personal lifestyle choices, including:  Daily care of your teeth and gums.  Regular physical activity.  Eating a healthy diet.  Avoiding tobacco and drug use.  Limiting alcohol use.  Practicing safe sex.  Taking low doses of aspirin every day.  Taking vitamin and mineral supplements as recommended by your health care provider. What happens during an annual well check? The services and screenings done by your health care provider during your annual well check will depend on your age, overall health, lifestyle risk factors, and family history of disease. Counseling  Your health care provider may ask you questions about your:  Alcohol use.  Tobacco use.  Drug use.  Emotional well-being.  Home and relationship well-being.  Sexual activity.  Eating habits.  History of falls.  Memory and ability to understand (cognition).  Work and work Statistician. Screening  You may have the following tests or measurements:  Height,  weight, and BMI.  Blood pressure.  Lipid and cholesterol levels. These may be checked every 5 years, or more frequently if you are over 45 years old.  Skin check.  Lung cancer screening. You may have this screening every year starting at age 82 if you have a 30-pack-year history of smoking and currently smoke or have quit within the past 15 years.  Fecal occult blood test (FOBT) of the stool. You may have this test every year starting at age 76.  Flexible sigmoidoscopy or colonoscopy. You may have a sigmoidoscopy every 5 years or a colonoscopy every 10 years starting at age 28.  Prostate cancer screening. Recommendations will vary depending on your family history and other risks.  Hepatitis C blood test.  Diabetes screening. This is done by checking your blood sugar (glucose) after you have not eaten for a while (fasting). You may have this done every 1-3 years.  Abdominal aortic aneurysm (AAA) screening. You may need this if you are a current or former smoker.  Osteoporosis. You may be screened starting at age 15 if you are at high risk. Talk with your health care provider about your test results, treatment options, and if necessary, the need for more tests. Vaccines  Your health care provider may recommend certain vaccines, such as:  Influenza vaccine. This is recommended every year.  Tetanus, diphtheria, and acellular pertussis (Tdap, Td) vaccine. You may need a Td booster every 10 years.  Zoster vaccine. You may need this after age 44.  Pneumococcal 13-valent conjugate (PCV13) vaccine. One dose is  recommended after age 19.  Pneumococcal polysaccharide (PPSV23) vaccine. One dose is recommended after age 60.  Talk to your health care provider about which screenings and vaccines you need and how often you need them. This information is not intended to replace advice given to you by your health care provider. Make sure you discuss any questions you have with your health care  provider. Document Released: 06/01/2015 Document Revised: 01/23/2016 Document Reviewed: 03/06/2015 Elsevier Interactive Patient Education  2017 [employer].

## 2016-07-01 NOTE — Progress Notes (Signed)
Subjective:   [name] is a 76 y.o. male who presents for Medicare Annual/Subsequent preventive examination.  Review of Systems:  Cardiac Risk Factors include: advanced age (>63men, >34 women);diabetes mellitus;dyslipidemia;family history of premature cardiovascular disease;hypertension;male gender;obesity (BMI >30kg/m2);sedentary lifestyle     Objective:    Vitals: BP 120/78   Pulse 78   Temp 97.9 F (36.6 C) (Oral)   Ht 5\' 10"  (1.778 m)   Wt 247 lb (112 kg)   SpO2 91%   BMI 35.44 kg/m   Body mass index is 35.44 kg/m.  Tobacco History  Smoking Status  . Former Smoker  . Packs/day: 0.50  . Years: 33.00  . Types: Cigarettes  . Quit date: 11/29/2002  Smokeless Tobacco  . Never Used    Comment: "stopped smoking in the 1990's     Counseling given: Not Answered   Past Medical History:  Diagnosis Date  . Abnormality of gait 05/01/2015  . Arthritis    "left leg" (08/30/2013)  . Arthritis of knee   . CAD (coronary artery disease)   . Chronic back pain   . Chronic bronchitis ([place])    "get it q yr"  . COPD (chronic obstructive pulmonary disease) ([place])   . CVA (cerebral infarction)   . Diabetes mellitus, type 1   . GERD (gastroesophageal reflux disease)   . Gout   . Hyperlipidemia   . Hypertension   . Myocardial infarction 1999  . Obesity   . On home oxygen therapy    "2L; only at night" (08/30/2013)  . Pneumonia    "a few times; last time was ~ 06/2013"  . Psoriasis   . Stroke ([place])   . Tussive syncope 05/01/2015  . Vitamin B12 deficiency 05/01/2015   Past Surgical History:  Procedure Laterality Date  . APPENDECTOMY    . COLONOSCOPY  03/17/2012   Procedure: COLONOSCOPY;  Surgeon: Rogene Houston, MD;  Location: [facility];  Service: Endoscopy;  Laterality: N/A;  830  . CORONARY ANGIOPLASTY WITH STENT PLACEMENT  1999- 2009-08/30/2013   "counting today's, I have 5 stents" (08/30/2013)  . CYSTOSCOPY WITH URETHRAL DILATATION N/A 11/26/2012   Procedure:  CYSTOSCOPY WITH URETHRAL DILATATION;  Surgeon: Malka So, MD;  Location: [facility] ORS;  Service: Urology;  Laterality: N/A;  . LEFT HEART CATHETERIZATION WITH CORONARY ANGIOGRAM N/A 08/30/2013   Procedure: LEFT HEART CATHETERIZATION WITH CORONARY ANGIOGRAM;  Surgeon: Clent Demark, MD;  Location: [facility] CATH LAB;  Service: Cardiovascular;  Laterality: N/A;  . PERCUTANEOUS CORONARY STENT INTERVENTION (PCI-S) Right 08/30/2013   Procedure: PERCUTANEOUS CORONARY STENT INTERVENTION (PCI-S);  Surgeon: Clent Demark, MD;  Location: [facility] CATH LAB;  Service: Cardiovascular;  Laterality: Right;   Family History  Problem Relation Age of Onset  . Hypertension Mother   . Diabetes Mother   . Hypertension Father   . Diabetes Brother   . Stroke Daughter   . Arthritis      Family History   . Diabetes      family History    History  Sexual Activity  . Sexual activity: Not Currently    Outpatient Encounter Prescriptions as of 07/01/2016  Medication Sig  . ACCU-CHEK AVIVA PLUS test strip FOUR TIMES A DAY (Patient taking differently: TWICE A DAY)  . acetaminophen (TYLENOL) 500 MG tablet Take 500 mg by mouth every 6 (six) hours as needed for moderate pain (left thigh).  Marland Kitchen albuterol (PROVENTIL HFA;VENTOLIN HFA) 108 (90 Base) MCG/ACT inhaler Inhale 2 puffs into the  lungs every 4 (four) hours as needed for wheezing or shortness of breath.  . allopurinol (ZYLOPRIM) 300 MG tablet TAKE 1 TABLET ONE TIME DAILY  . amLODipine (NORVASC) 10 MG tablet TAKE 1 TABLET EVERY DAY  . aspirin 325 MG tablet Take 1 tablet (325 mg total) by mouth daily.  . insulin NPH-regular Human (NOVOLIN 70/30) (70-30) 100 UNIT/ML injection INJECT 40 UNITS SUBCUTANEOUSLY TWICE DAILY  . lisinopril (PRINIVIL,ZESTRIL) 40 MG tablet TAKE 1 TABLET EVERY DAY  . metFORMIN (GLUCOPHAGE) 500 MG tablet TAKE 1 TABLET TWICE DAILY  . senna (SENOKOT) 8.6 MG tablet Take 1 tablet by mouth daily.  Marland Kitchen tiotropium (SPIRIVA) 18 MCG inhalation capsule Place 18 mcg into  inhaler and inhale daily. Reported on 11/22/2015  . triamcinolone cream (KENALOG) 0.1 % Apply 1 application topically 2 (two) times daily. Apply twice daily to rash on right elbow  , under a moist wrap for 4 weeks  . vitamin B-12 (CYANOCOBALAMIN) 1000 MCG tablet Take 1,000 mcg by mouth daily.  . [DISCONTINUED] clopidogrel (PLAVIX) 75 MG tablet TAKE 1 TABLET EVERY DAY  . [DISCONTINUED] levETIRAcetam (KEPPRA) 500 MG tablet Take 1 tablet (500 mg total) by mouth 2 (two) times daily.  . [DISCONTINUED] metoprolol (LOPRESSOR) 50 MG tablet Take 0.5 tablets (25 mg total) by mouth 2 (two) times daily.  . [DISCONTINUED] pantoprazole (PROTONIX) 40 MG tablet TAKE 1 TABLET EVERY DAY BEFORE BREAKFAST  . [DISCONTINUED] pravastatin (PRAVACHOL) 40 MG tablet TAKE 2 TABLETS EVERY DAY  . [DISCONTINUED] spironolactone (ALDACTONE) 25 MG tablet Take 1 tablet (25 mg total) by mouth daily.  . hydroxypropyl cellulose (LACRISERT) 5 MG INST Place 5 mg into the left eye at bedtime. (Patient not taking: Reported on 04/18/2016)  . levothyroxine (SYNTHROID, LEVOTHROID) 75 MCG tablet TAKE 1 TABLET EVERY MORNING (Patient not taking: Reported on 07/01/2016)   No facility-administered encounter medications on file as of 07/01/2016.     Activities of Daily Living In your present state of health, do you have any difficulty performing the following activities: 07/01/2016 09/26/2015  Hearing? N N  Vision? Y N  Difficulty concentrating or making decisions? [name]  Walking or climbing stairs? Y Y  Dressing or bathing? N Y  Doing errands, shopping? [name]  Preparing Food and eating ? N Y  Using the Toilet? N N  In the past six months, have you accidently leaked urine? N Y  Do you have problems with loss of bowel control? N N  Managing your Medications? Y Y  Managing your Finances? [name]  Housekeeping or managing your Housekeeping? [name]  Some recent data might be hidden    Patient Care Team: Fayrene Helper, MD as PCP - General Sinda Du, MD as Consulting Physician (Pulmonary Disease) Charolette Forward, MD as Consulting Physician (Cardiology) Rutherford Guys, MD as Consulting Physician (Ophthalmology) Kathrynn Ducking, MD as Consulting Physician (Neurology) [name], RN as [place] Management   Assessment:    Exercise Activities and Dietary recommendations Current Exercise Habits: The patient does not participate in regular exercise at present, Exercise limited by: respiratory conditions(s)  Goals      Patient Stated   . <enter goal here> (pt-stated)          "Live and let live"       Other   . Exercise 3x per week (30 min per time)          Recommend starting chair exercises 3 times a week for at least  30 minutes at a time as tolerated.      Fall Risk Fall Risk  07/01/2016 06/19/2016 04/18/2016 03/24/2016 03/20/2016  Falls in the past year? No No No No No  Number falls in past yr: - - - - -  Injury with Fall? - - - - -  Risk Factor Category  - - - - -  Risk for fall due to : - - - Impaired balance/gait -  Risk for fall due to (comments): - - - - -  Follow up - - - - -   Depression Screen PHQ 2/9 Scores 07/01/2016 06/19/2016 04/18/2016 03/24/2016  PHQ - 2 Score 0 0 0 0    Cognitive Function: Normal  MMSE - Mini Mental State Exam 05/01/2015  Orientation to time 5  Orientation to Place 4  Registration 3  Attention/ Calculation 5  Recall 2  Language- name 2 objects 2  Language- repeat 1  Language- follow 3 step command 3  Language- read & follow direction 1  Write a sentence 1  Copy design 0  Total score 27     6CIT Screen 07/01/2016  What Year? 0 points  What month? 0 points  What time? 0 points  Count back from 20 0 points  Months in reverse 0 points  Repeat phrase 0 points  Total Score 0    Immunization History  Administered Date(s) Administered  . Influenza Whole 03/18/2005  . Pneumococcal Conjugate-13 12/08/2013  . Pneumococcal Polysaccharide-23 06/09/2012  . Td  12/04/2003   Screening Tests Health Maintenance  Topic Date Due  . INFLUENZA VACCINE  08/16/2016 (Originally 12/18/2015)  . ZOSTAVAX  04/17/2017 (Originally 03/25/2002)  . TETANUS/TDAP  05/06/2017 (Originally 12/03/2013)  . HEMOGLOBIN A1C  07/16/2016  . FOOT EXAM  08/02/2016  . OPHTHALMOLOGY EXAM  10/01/2016  . COLONOSCOPY  03/17/2022  . PNA vac Low Risk Adult  Completed      Plan:  I have personally reviewed and addressed the Medicare Annual Wellness questionnaire and have noted the following in the patient's chart:  A. Medical and social history B. Use of alcohol, tobacco or illicit drugs  C. Current medications and supplements D. Functional ability and status E.  Nutritional status F.  Physical activity G. Advance directives H. List of other physicians I.  Hospitalizations, surgeries, and ER visits in previous 12 months J.  [place] to include cognitive, depression, and falls L. Referrals and appointments - none  In addition, I have reviewed and discussed with patient certain preventive protocols, quality metrics, and best practice recommendations. A written personalized care plan for preventive services as well as general preventive health recommendations were provided to patient.  Signed,   Stormy Fabian, LPN Lead Nurse Health Advisor

## 2016-07-04 ENCOUNTER — Encounter: Payer: Self-pay | Admitting: Family Medicine

## 2016-07-04 IMAGING — CT CT HEAD W/O CM
1 of 2 series · 15 of 30 positions shown, 19 images · non-contrast
Comparison: Head CT dated 06/23/2014

CLINICAL DATA: 74-year-old male with right-sided weakness since
started yesterday.

EXAM:
CT HEAD WITHOUT CONTRAST
TECHNIQUE: Contiguous axial images were obtained from the base of the skull
through the vertex without intravenous contrast.

[Series 2: headtrauma 4.8 h37s · axial · 0.49mm/px · z∈[+88,+248]mm · 15 of 36 slices shown, 19 images]
[im 2/36  brain]
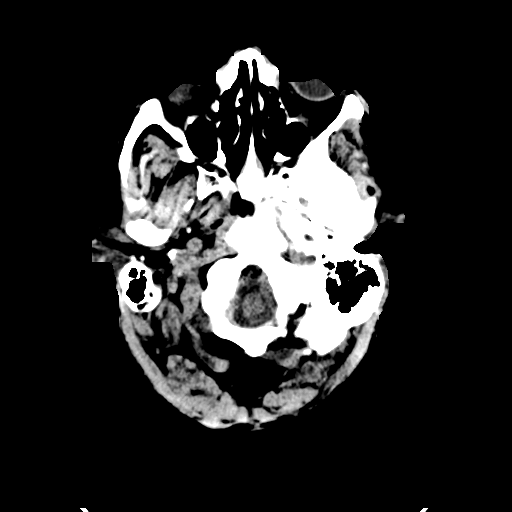
[im 2/36  bone]
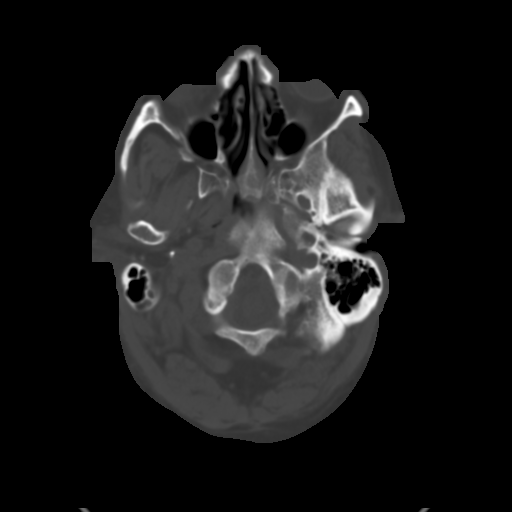
[im 4/36  brain]
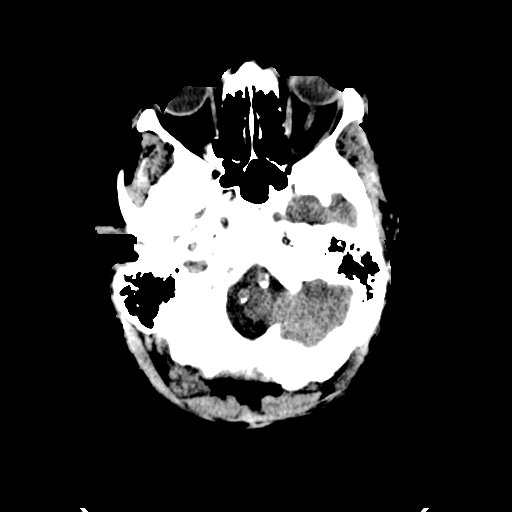
[im 8/36  brain]
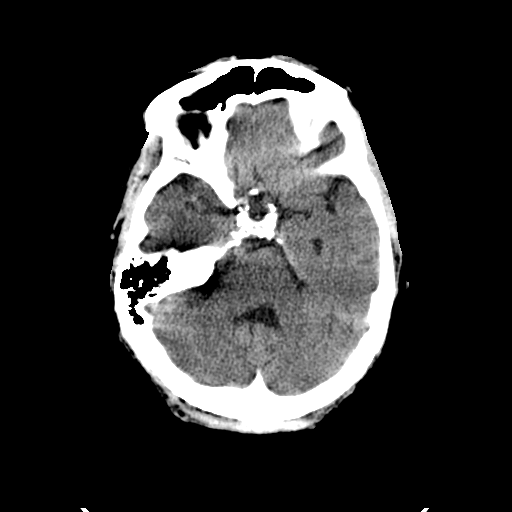
[im 10/36  brain]
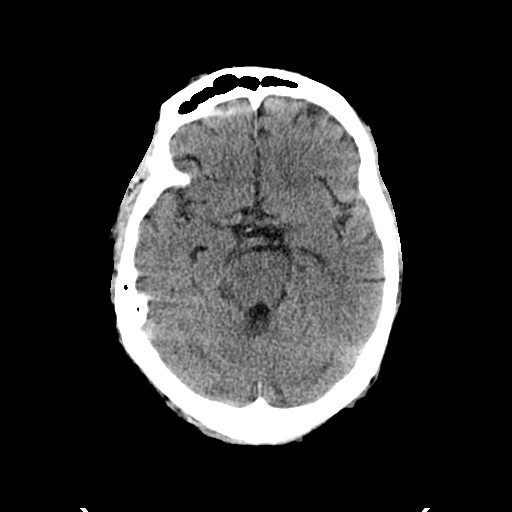
[im 12/36  brain]
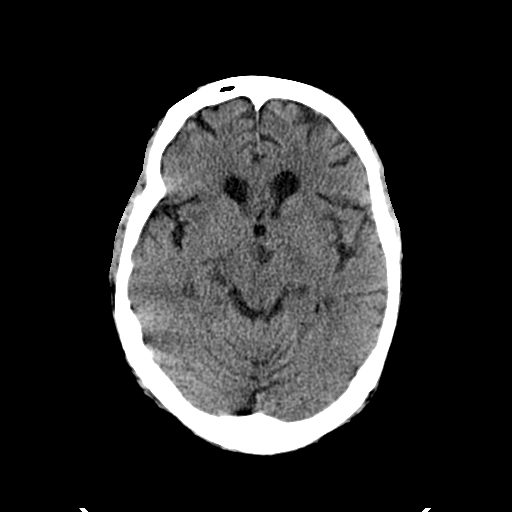
[im 12/36  bone]
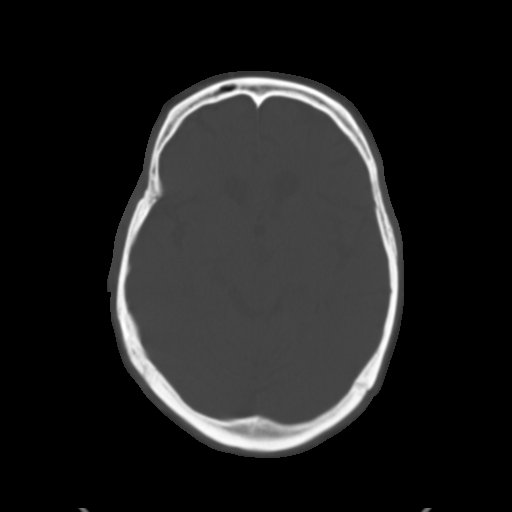
[im 13/36  brain]
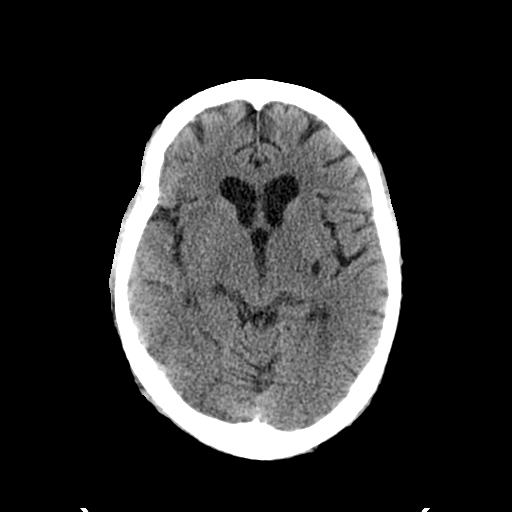
[im 15/36  brain]
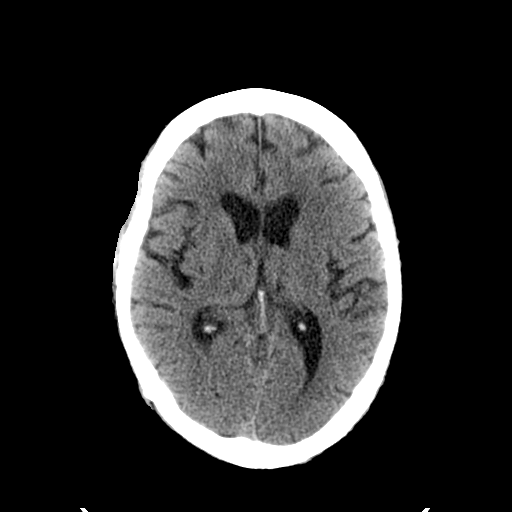
[im 19/36  brain]
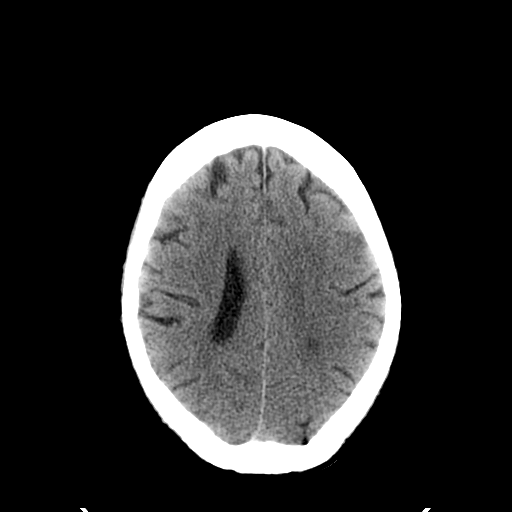
[im 21/36  brain]
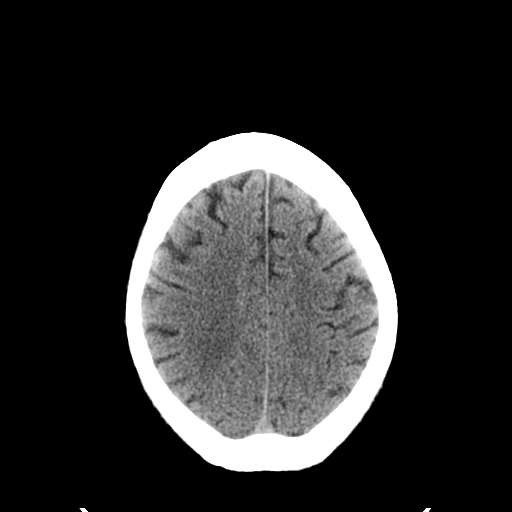
[im 21/36  bone]
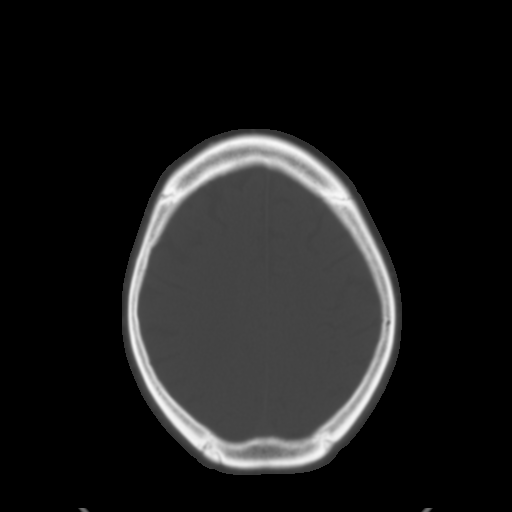
[im 23/36  brain]
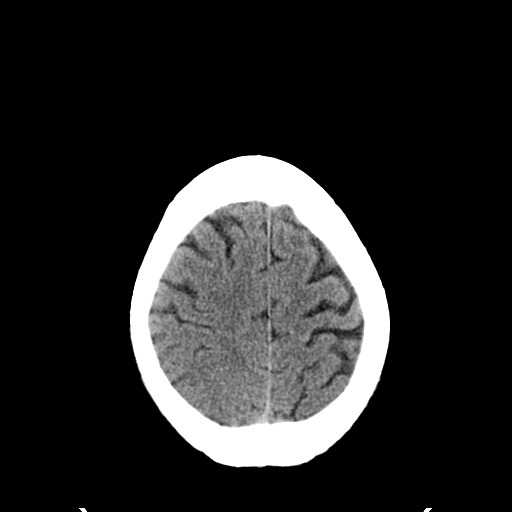
[im 24/36  brain]
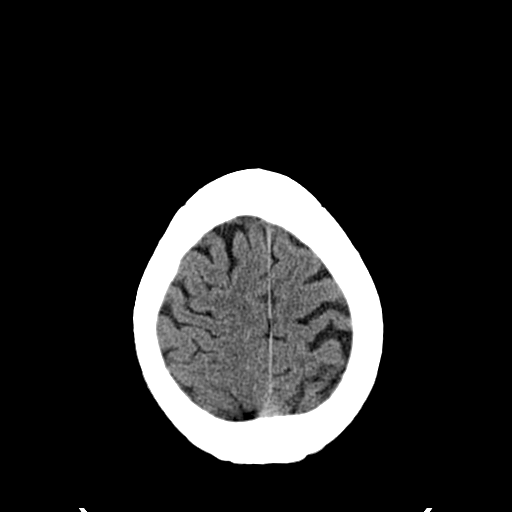
[im 26/36  brain]
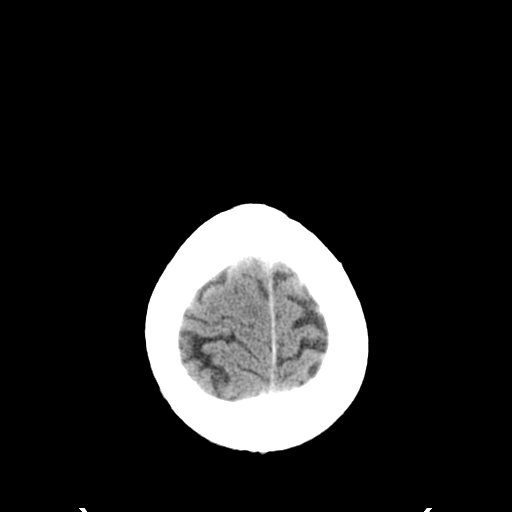
[im 30/36  brain]
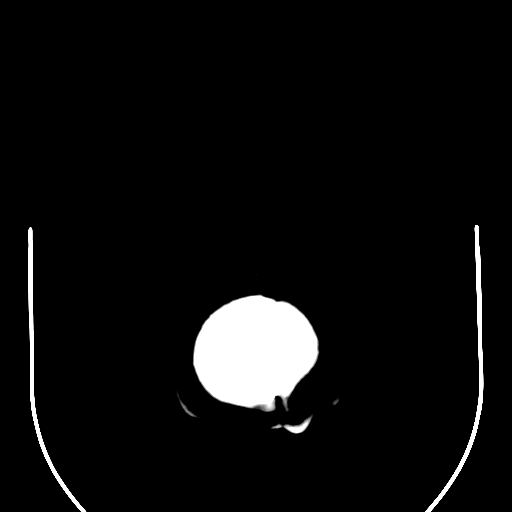
[im 30/36  bone]
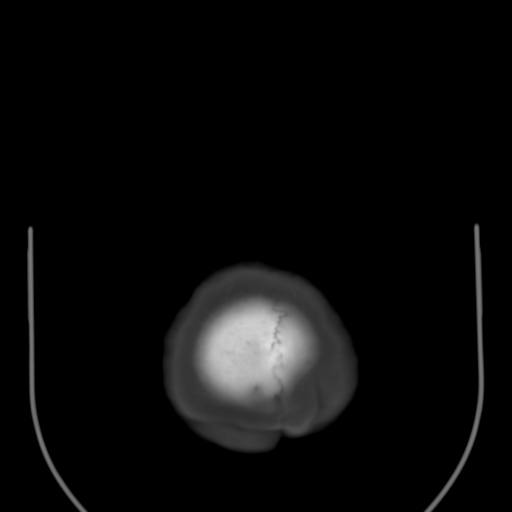
[im 32/36  brain]
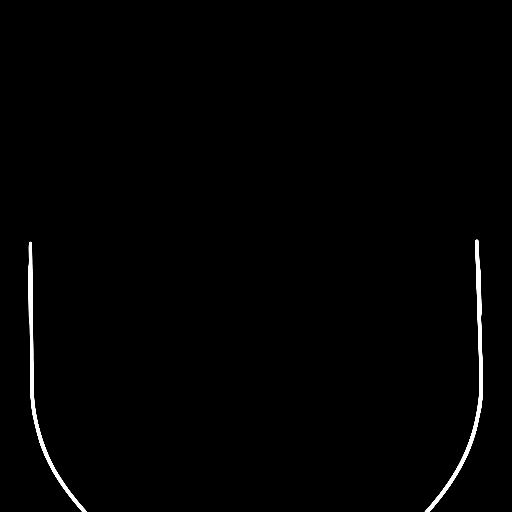
[im 34/36  brain]
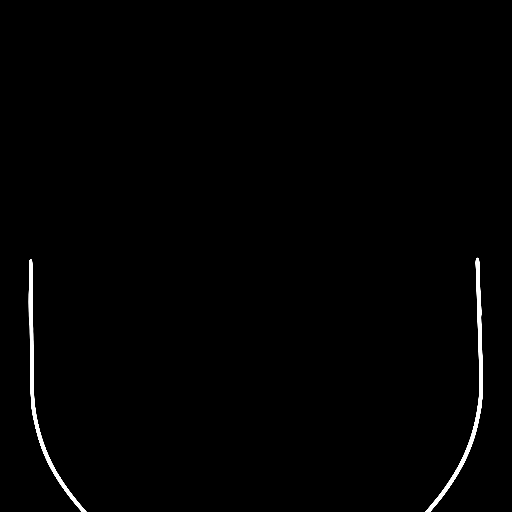

[15 of 30 positions shown; findings below may reference images not displayed]

FINDINGS: There is slight prominence of the ventricles and sulci compatible
with age-related volume loss. Mild periventricular and deep white
matter hypodensities represent chronic microvascular ischemic
changes. Small left basal ganglia old lacunar infarct. There is no
intracranial hemorrhage. No mass effect or midline shift identified.

The visualized paranasal sinuses and mastoid air cells are well
aerated. The calvarium is intact.
IMPRESSION: No acute intracranial hemorrhage.

Mild age-related atrophy and chronic microvascular ischemic disease.

If symptoms persist and there are no contraindications, MRI may
provide better evaluation if clinically indicated

## 2016-07-04 NOTE — Assessment & Plan Note (Signed)
Deteriorated. Patient re-educated about  the importance of commitment to a  minimum of 150 minutes of exercise per week.  The importance of healthy food choices with portion control discussed. Encouraged to start a food diary, count calories and to consider  joining a support group. Sample diet sheets offered. Goals set by the patient for the next several months.   Weight /BMI 07/01/2016 07/01/2016 03/24/2016  WEIGHT 247 lb 247 lb 244 lb  HEIGHT 5\' 10"  5\' 10"  5\' 10"   BMI 35.44 kg/m2 35.44 kg/m2 35.01 kg/m2

## 2016-07-04 NOTE — Assessment & Plan Note (Signed)
Hyperlipidemia:Low fat diet discussed and encouraged.   Lipid Panel  Lab Results  Component Value Date   CHOL 133 06/25/2015   HDL 26 (L) 06/25/2015   LDLCALC 72 06/25/2015   LDLDIRECT 50 08/18/2014   TRIG 175 (H) 06/25/2015   CHOLHDL 5.1 06/25/2015     Updated lab needed at/ before next visit.

## 2016-07-04 NOTE — Assessment & Plan Note (Signed)
Mr. [name] is reminded of the importance of commitment to daily physical activity for 30 minutes or more, as able and the need to limit carbohydrate intake to 30 to 60 grams per meal to help with blood sugar control.   The need to take medication as prescribed, test blood sugar as directed, and to call between visits if there is a concern that blood sugar is uncontrolled is also discussed.   [name] is reminded of the importance of daily foot exam, annual eye examination, and good blood sugar, blood pressure and cholesterol control.  Updated lab needed at/ before next visit. Managed by endo Diabetic Labs Latest Ref Rng & Units 06/09/2016 01/15/2016 10/06/2015 09/22/2015 06/25/2015  HbA1c <5.7 % - 6.0(H) 6.1(H) - 7.2(H)  Microalbumin <2.0 mg/dL - - - - -  Micro/Creat Ratio 0.0 - 30.0 mg/g - - - - -  Chol 0 - 200 mg/dL - - - - 133  HDL >40 mg/dL - - - - 26(L)  Calc LDL 0 - 99 mg/dL - - - - 72  Triglycerides <150 mg/dL - - - - 175(H)  Creatinine 0.61 - 1.24 mg/dL 1.16 1.22(H) 1.44(H) 1.24 1.10   BP/Weight 07/01/2016 07/01/2016 06/09/2016 03/24/2016 02/21/2016 [id number]  Systolic BP 123456 123456 123456 [id number] 0000000 123456  Diastolic BP 78 78 62 66 68 64 58  Wt. (Lbs) 247 247 - 244 247 246 245  BMI 35.44 35.44 - 35.01 35.44 35.3 35.15   Foot/eye exam completion dates Latest Ref Rng & Units 10/02/2015 08/03/2015  Eye Exam No Retinopathy No Retinopathy -  Foot exam Order - - -  Foot Form Completion - - Done

## 2016-07-04 NOTE — Progress Notes (Signed)
TARAJ YOFFE     MRN: [id number]      DOB: 1941-04-05   HPI Mr. Borowsky is here for follow up and re-evaluation of chronic medical conditions, medication management and review of any available recent lab and radiology data.  Preventive health is updated, specifically  Cancer screening and Immunization.   Questions or concerns regarding consultations or procedures which the PT has had in the interim are  addressed. The PT denies any adverse reactions to current medications since the last visit.  There are no new concerns.  There are no specific complaints   ROS Denies recent fever or chills. Denies sinus pressure, nasal congestion, ear pain or sore throat. Denies chest congestion, productive cough or wheezing. Denies chest pains, palpitations and leg swelling Denies abdominal pain, nausea, vomiting,diarrhea or constipation.   Denies dysuria, frequency, hesitancy or incontinence.  Denies headaches, seizures, numbness, or tingling. Denies depression, anxiety or insomnia.  PE  BP 120/78   Pulse 78   Resp 17   Ht 5\' 10"  (1.778 m)   Wt 247 lb (112 kg)   SpO2 91%   BMI 35.44 kg/m   Patient alert and oriented and in no cardiopulmonary distress.  HEENT:  facial asymmetry with mild right sided droop, EOMI,   oropharynx pink and moist.  Neck supple no JVD, no mass.  Chest: Clear to auscultation bilaterally.  CVS: S1, S2 no murmurs, no S3.Regular rate.  ABD: Soft non tender.   Ext: No edema  MS: decreased  ROM spine, shoulders, hips and knees.  Skin: Intact, psoriatic  rash noted.on elbows Psych: Good eye contact, normal affect. Memory intact not anxious or depressed appearing.  CNS: CN 2-12 intact,  Grade 4 power in Right upper and lower extremities,   Assessment & Plan  Essential hypertension Controlled, no change in medication DASH diet and commitment to daily physical activity for a minimum of 30 minutes discussed and encouraged, as a part of hypertension  management. The importance of attaining a healthy weight is also discussed.  BP/Weight 07/01/2016 07/01/2016 06/09/2016 03/24/2016 02/21/2016 [id number]  Systolic BP 123456 123456 123456 [id number] 0000000 123456  Diastolic BP 78 78 62 66 68 64 58  Wt. (Lbs) 247 247 - 244 247 246 245  BMI 35.44 35.44 - 35.01 35.44 35.3 35.15       Hyperlipidemia Hyperlipidemia:Low fat diet discussed and encouraged.   Lipid Panel  Lab Results  Component Value Date   CHOL 133 06/25/2015   HDL 26 (L) 06/25/2015   LDLCALC 72 06/25/2015   LDLDIRECT 50 08/18/2014   TRIG 175 (H) 06/25/2015   CHOLHDL 5.1 06/25/2015     Updated lab needed at/ before next visit.   Hypothyroidism Managed by endo and controlled  Obesity (BMI 30.0-34.9) Deteriorated. Patient re-educated about  the importance of commitment to a  minimum of 150 minutes of exercise per week.  The importance of healthy food choices with portion control discussed. Encouraged to start a food diary, count calories and to consider  joining a support group. Sample diet sheets offered. Goals set by the patient for the next several months.   Weight /BMI 07/01/2016 07/01/2016 03/24/2016  WEIGHT 247 lb 247 lb 244 lb  HEIGHT 5\' 10"  5\' 10"  5\' 10"   BMI 35.44 kg/m2 35.44 kg/m2 35.01 kg/m2      PSORIASIS Controlled with topical high potency steroid, current flare on elbows  Right hemiparesis (HCC) Improved strength and gait following left CVA Fall precautions reviewed  Type  2 diabetes mellitus with diabetic nephropathy [facility]) Mr. Tamminga is reminded of the importance of commitment to daily physical activity for 30 minutes or more, as able and the need to limit carbohydrate intake to 30 to 60 grams per meal to help with blood sugar control.   The need to take medication as prescribed, test blood sugar as directed, and to call between visits if there is a concern that blood sugar is uncontrolled is also discussed.   Mr. Hitchins is reminded of the  importance of daily foot exam, annual eye examination, and good blood sugar, blood pressure and cholesterol control.  Updated lab needed at/ before next visit. Managed by endo Diabetic Labs Latest Ref Rng & Units 06/09/2016 01/15/2016 10/06/2015 09/22/2015 06/25/2015  HbA1c <5.7 % - 6.0(H) 6.1(H) - 7.2(H)  Microalbumin <2.0 mg/dL - - - - -  Micro/Creat Ratio 0.0 - 30.0 mg/g - - - - -  Chol 0 - 200 mg/dL - - - - 133  HDL >40 mg/dL - - - - 26(L)  Calc LDL 0 - 99 mg/dL - - - - 72  Triglycerides <150 mg/dL - - - - 175(H)  Creatinine 0.61 - 1.24 mg/dL 1.16 1.22(H) 1.44(H) 1.24 1.10   BP/Weight 07/01/2016 07/01/2016 06/09/2016 03/24/2016 02/21/2016 [id number]  Systolic BP 123456 123456 123456 [id number] 0000000 123456  Diastolic BP 78 78 62 66 68 64 58  Wt. (Lbs) 247 247 - 244 247 246 245  BMI 35.44 35.44 - 35.01 35.44 35.3 35.15   Foot/eye exam completion dates Latest Ref Rng & Units 10/02/2015 08/03/2015  Eye Exam No Retinopathy No Retinopathy -  Foot exam Order - - -  Foot Form Completion - - Done

## 2016-07-04 NOTE — Assessment & Plan Note (Signed)
Controlled, no change in medication DASH diet and commitment to daily physical activity for a minimum of 30 minutes discussed and encouraged, as a part of hypertension management. The importance of attaining a healthy weight is also discussed.  BP/Weight 07/01/2016 07/01/2016 06/09/2016 03/24/2016 02/21/2016 [id number]  Systolic BP 123456 123456 123456 [id number] 0000000 123456  Diastolic BP 78 78 62 66 68 64 58  Wt. (Lbs) 247 247 - 244 247 246 245  BMI 35.44 35.44 - 35.01 35.44 35.3 35.15

## 2016-07-04 NOTE — Assessment & Plan Note (Signed)
Managed by endo and controlled 

## 2016-07-04 NOTE — Assessment & Plan Note (Signed)
Improved strength and gait following left CVA Fall precautions reviewed

## 2016-07-04 NOTE — Assessment & Plan Note (Signed)
Controlled with topical high potency steroid, current flare on elbows

## 2016-07-08 ENCOUNTER — Other Ambulatory Visit: Payer: Self-pay | Admitting: "Endocrinology

## 2016-07-08 DIAGNOSIS — E038 Other specified hypothyroidism: Secondary | ICD-10-CM | POA: Diagnosis not present

## 2016-07-08 DIAGNOSIS — E1121 Type 2 diabetes mellitus with diabetic nephropathy: Secondary | ICD-10-CM | POA: Diagnosis not present

## 2016-07-08 DIAGNOSIS — Z794 Long term (current) use of insulin: Secondary | ICD-10-CM | POA: Diagnosis not present

## 2016-07-08 DIAGNOSIS — E782 Mixed hyperlipidemia: Secondary | ICD-10-CM | POA: Diagnosis not present

## 2016-07-08 DIAGNOSIS — E559 Vitamin D deficiency, unspecified: Secondary | ICD-10-CM | POA: Diagnosis not present

## 2016-07-08 LAB — COMPREHENSIVE METABOLIC PANEL
ALBUMIN: 3.9 g/dL (ref 3.6–5.1)
ALT: 5 U/L — AB (ref 9–46)
AST: 7 U/L — ABNORMAL LOW (ref 10–35)
Alkaline Phosphatase: 69 U/L (ref 40–115)
BUN: 16 mg/dL (ref 7–25)
CALCIUM: 9.1 mg/dL (ref 8.6–10.3)
CO2: 26 mmol/L (ref 20–31)
CREATININE: 1.12 mg/dL (ref 0.70–1.18)
Chloride: 105 mmol/L (ref 98–110)
Glucose, Bld: 109 mg/dL — ABNORMAL HIGH (ref 65–99)
Potassium: 4.3 mmol/L (ref 3.5–5.3)
SODIUM: 141 mmol/L (ref 135–146)
TOTAL PROTEIN: 6.9 g/dL (ref 6.1–8.1)
Total Bilirubin: 0.3 mg/dL (ref 0.2–1.2)

## 2016-07-08 LAB — LIPID PANEL
CHOL/HDL RATIO: 5.6 ratio — AB (ref <5.0)
CHOLESTEROL: 135 mg/dL (ref <200)
HDL: 24 mg/dL — AB (ref >40)
LDL CALC: 63 mg/dL (ref <100)
TRIGLYCERIDES: 241 mg/dL — AB (ref <150)
VLDL: 48 mg/dL — AB (ref <30)

## 2016-07-08 LAB — T4, FREE: FREE T4: 0.9 ng/dL (ref 0.8–1.8)

## 2016-07-08 LAB — TSH: TSH: 8.93 m[IU]/L — AB (ref 0.40–4.50)

## 2016-07-08 IMAGING — CT CT HEAD W/O CM
1 series · 16 of 30 positions shown, 20 images · non-contrast
Comparison: May 10, 2015 CT head, MR brain May 10, 2015

CLINICAL DATA: Recent history of a stroke, continue right-sided
weakness.

EXAM:
CT HEAD WITHOUT CONTRAST
TECHNIQUE: Contiguous axial images were obtained from the base of the skull
through the vertex without intravenous contrast.

[Series 2: headseq 4.8 h37s · axial · 0.46mm/px · z∈[+82,+237]mm · 16 of 36 slices shown, 20 images]
[im 2/36  brain]
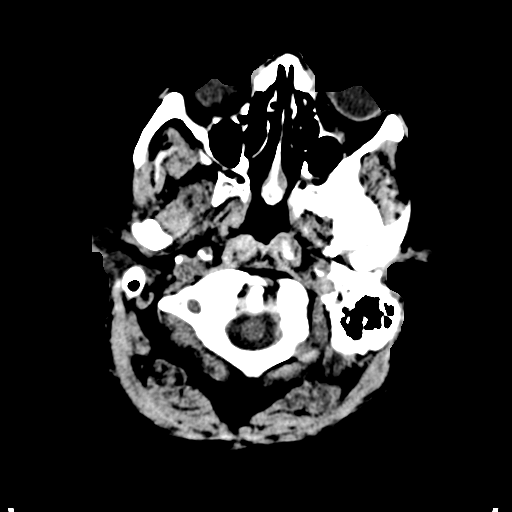
[im 2/36  bone]
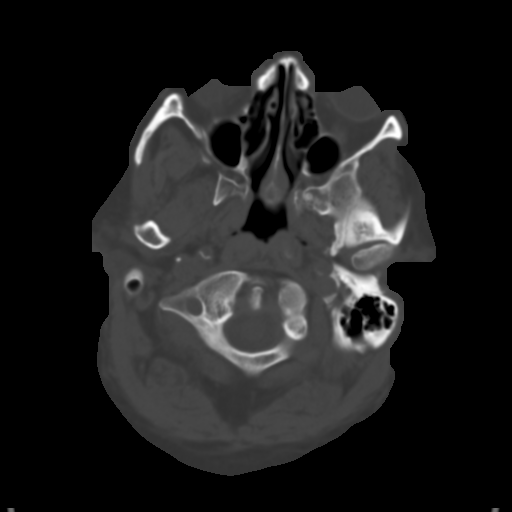
[im 4/36  brain]
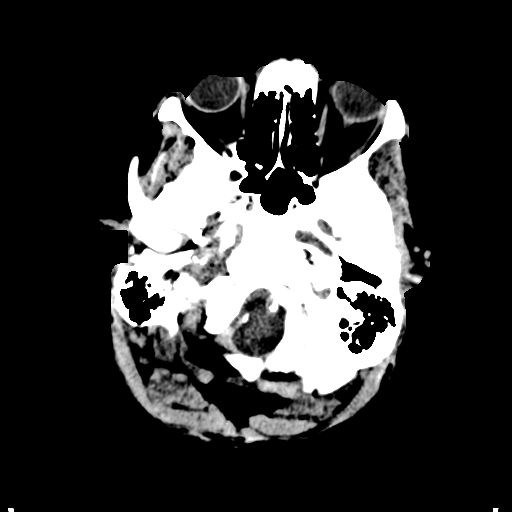
[im 7/36  brain]
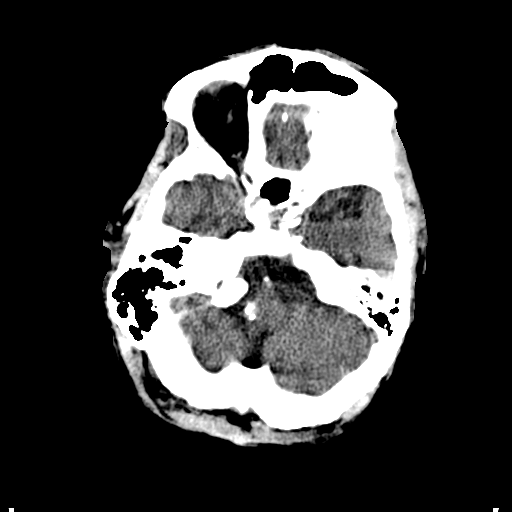
[im 9/36  brain]
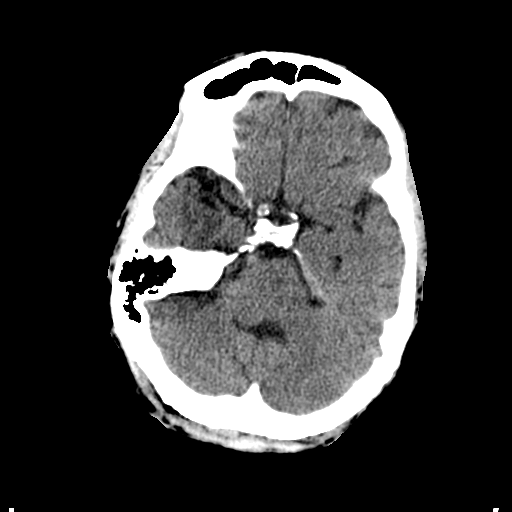
[im 10/36  brain]
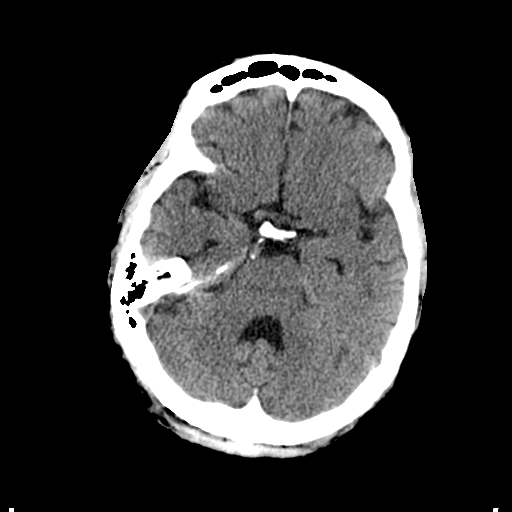
[im 10/36  bone]
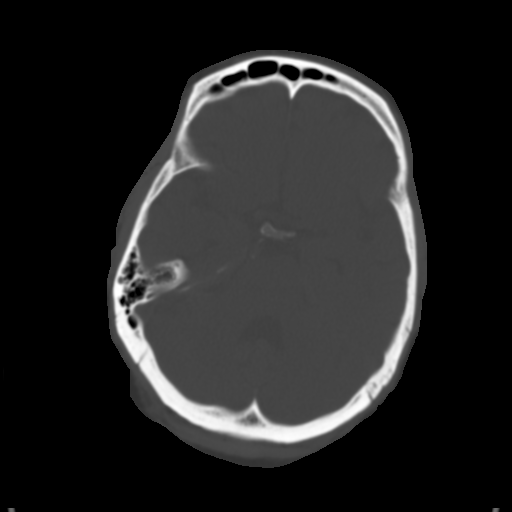
[im 13/36  brain]
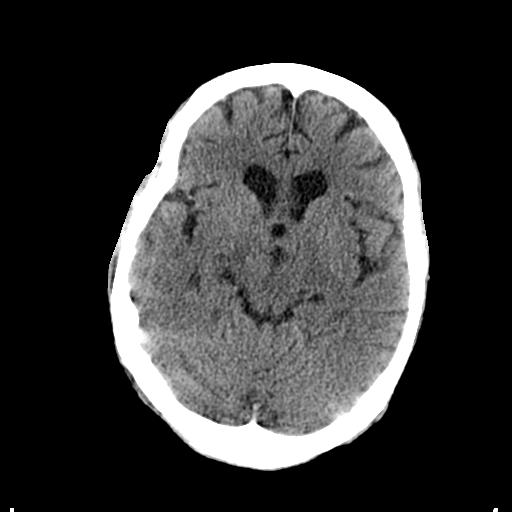
[im 15/36  brain]
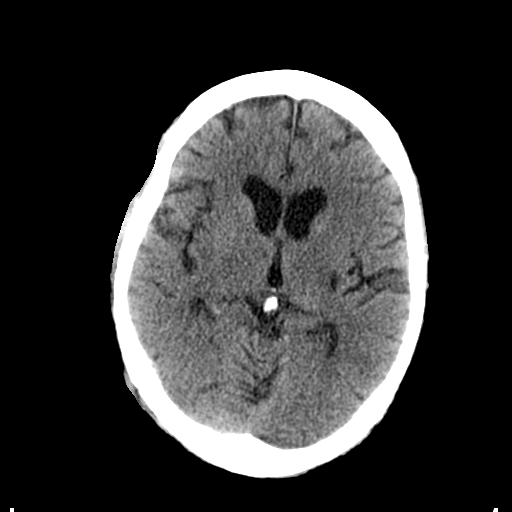
[im 17/36  brain]
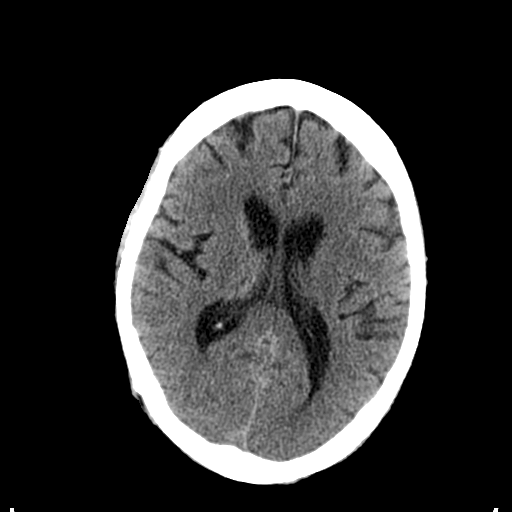
[im 19/36  brain]
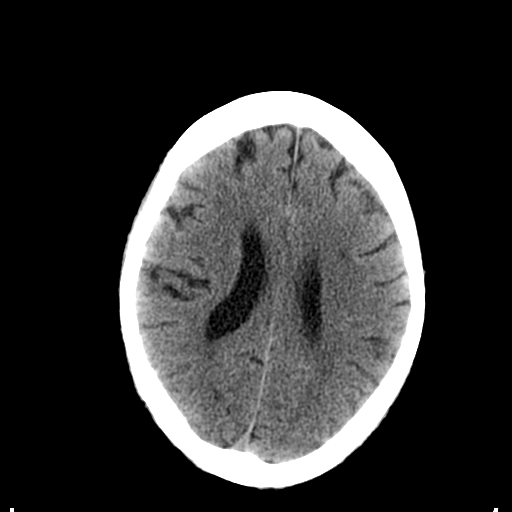
[im 19/36  bone]
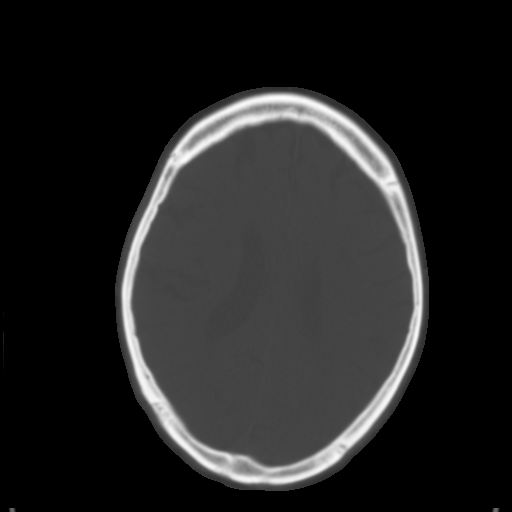
[im 21/36  brain]
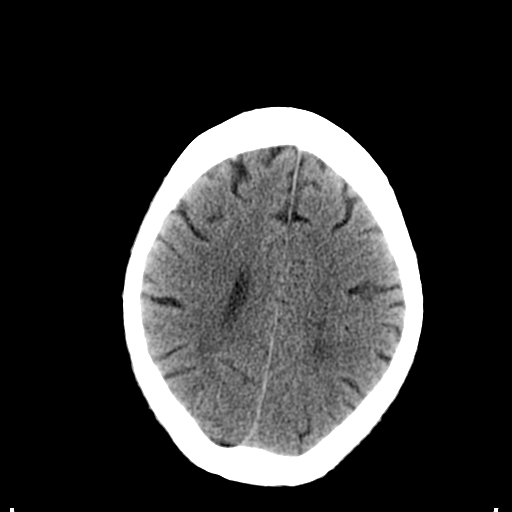
[im 23/36  brain]
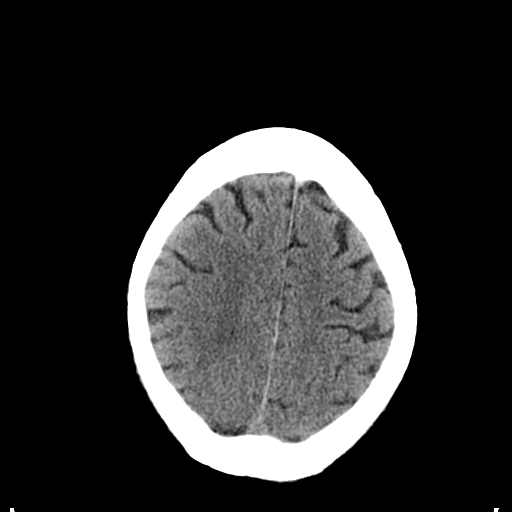
[im 26/36  brain]
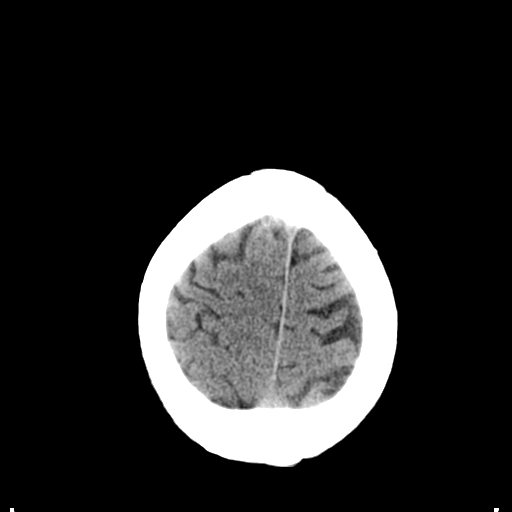
[im 27/36  brain]
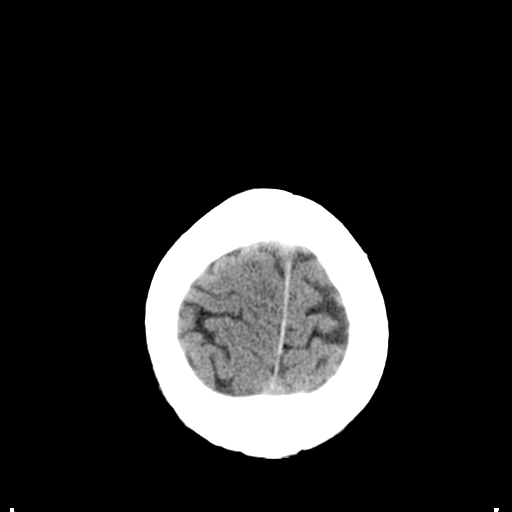
[im 27/36  bone]
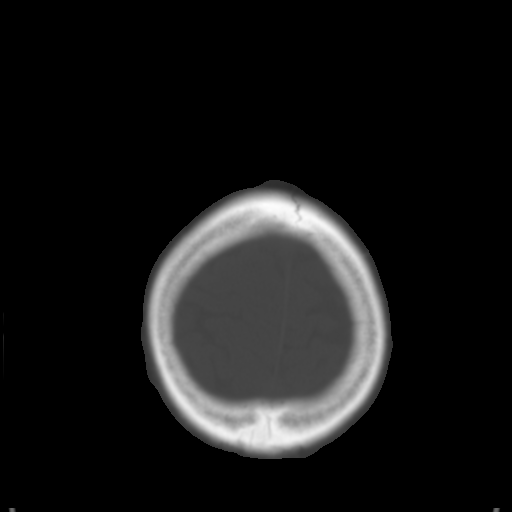
[im 29/36  brain]
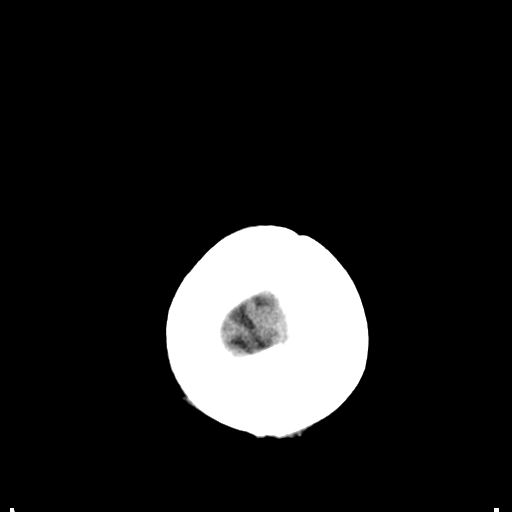
[im 32/36  brain]
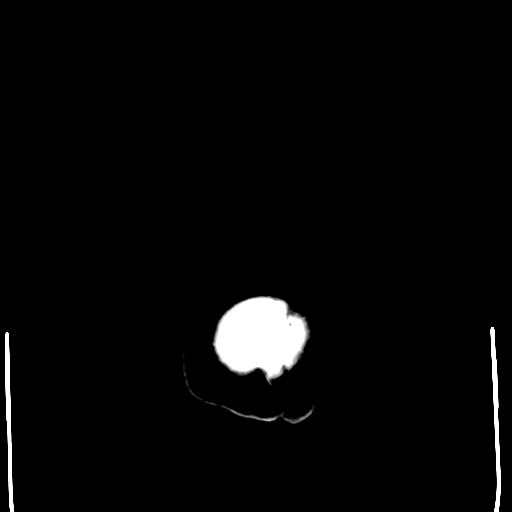
[im 34/36  brain]
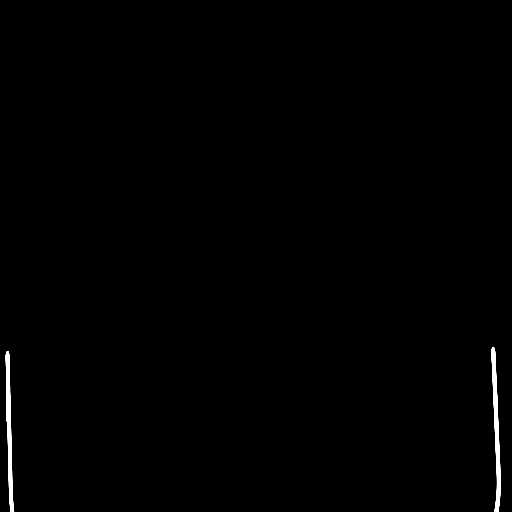

[16 of 30 positions shown; findings below may reference images not displayed]

FINDINGS: The previous MR noted left pontine acute infarct is not as well
appreciated on the CT scan. There is no hemorrhagic transformation.
There is no midline shift, hydrocephalus. There is old lacune
infarction of the left basal ganglia, unchanged. No new infarcts are
identified. The bony calvarium is intact. The visualized sinuses are
clear.
IMPRESSION: Previous MRI dated left pontine acute infarct is not as well
appreciated on CT scan. There is no hemorrhagic transformation. No
new infarcts are identified.

## 2016-07-09 LAB — VITAMIN D 25 HYDROXY (VIT D DEFICIENCY, FRACTURES): Vit D, 25-Hydroxy: 14 ng/mL — ABNORMAL LOW (ref 30–100)

## 2016-07-09 LAB — HEMOGLOBIN A1C
Hgb A1c MFr Bld: 6.6 % — ABNORMAL HIGH (ref <5.7)
Mean Plasma Glucose: 143 mg/dL

## 2016-07-10 ENCOUNTER — Other Ambulatory Visit: Payer: Self-pay | Admitting: Family Medicine

## 2016-07-10 MED ORDER — EZETIMIBE 10 MG PO TABS: 10.0000 mg | ORAL_TABLET | Freq: Every day | ORAL | 5 refills | Status: DC

## 2016-07-10 MED ORDER — ERGOCALCIFEROL 1.25 MG (50000 UT) PO CAPS: 50000.0000 [IU] | ORAL_CAPSULE | ORAL | 1 refills | Status: DC

## 2016-07-14 ENCOUNTER — Telehealth: Payer: Self-pay | Admitting: Family Medicine

## 2016-07-14 ENCOUNTER — Encounter: Payer: Self-pay | Admitting: "Endocrinology

## 2016-07-14 ENCOUNTER — Ambulatory Visit (INDEPENDENT_AMBULATORY_CARE_PROVIDER_SITE_OTHER): Payer: Medicare HMO | Admitting: "Endocrinology

## 2016-07-14 VITALS — BP 157/74 | HR 80 | Ht 70.0 in | Wt 251.0 lb

## 2016-07-14 DIAGNOSIS — E782 Mixed hyperlipidemia: Secondary | ICD-10-CM | POA: Diagnosis not present

## 2016-07-14 DIAGNOSIS — I10 Essential (primary) hypertension: Secondary | ICD-10-CM | POA: Diagnosis not present

## 2016-07-14 DIAGNOSIS — Z125 Encounter for screening for malignant neoplasm of prostate: Secondary | ICD-10-CM

## 2016-07-14 DIAGNOSIS — E785 Hyperlipidemia, unspecified: Secondary | ICD-10-CM

## 2016-07-14 DIAGNOSIS — E038 Other specified hypothyroidism: Secondary | ICD-10-CM | POA: Diagnosis not present

## 2016-07-14 DIAGNOSIS — E1142 Type 2 diabetes mellitus with diabetic polyneuropathy: Secondary | ICD-10-CM

## 2016-07-14 DIAGNOSIS — E1121 Type 2 diabetes mellitus with diabetic nephropathy: Secondary | ICD-10-CM | POA: Diagnosis not present

## 2016-07-14 DIAGNOSIS — E559 Vitamin D deficiency, unspecified: Secondary | ICD-10-CM

## 2016-07-14 MED ORDER — LEVOTHYROXINE SODIUM 88 MCG PO TABS: 88.0000 ug | ORAL_TABLET | Freq: Every morning | ORAL | 6 refills | Status: DC

## 2016-07-14 NOTE — Progress Notes (Signed)
Subjective:    Patient ID: [name], male    DOB: 03-26-1941, PCP Tula Nakayama, MD   Past Medical History:  Diagnosis Date  . Abnormality of gait 05/01/2015  . Arthritis    "left leg" (08/30/2013)  . Arthritis of knee   . CAD (coronary artery disease)   . Chronic back pain   . Chronic bronchitis ([place])    "get it q yr"  . COPD (chronic obstructive pulmonary disease) ([place])   . CVA (cerebral infarction)   . Diabetes mellitus, type 1   . GERD (gastroesophageal reflux disease)   . Gout   . Hyperlipidemia   . Hypertension   . Myocardial infarction 1999  . Obesity   . On home oxygen therapy    "2L; only at night" (08/30/2013)  . Pneumonia    "a few times; last time was ~ 06/2013"  . Psoriasis   . Stroke ([place])   . Tussive syncope 05/01/2015  . Vitamin B12 deficiency 05/01/2015   Past Surgical History:  Procedure Laterality Date  . APPENDECTOMY    . COLONOSCOPY  03/17/2012   Procedure: COLONOSCOPY;  Surgeon: Rogene Houston, MD;  Location: [facility];  Service: Endoscopy;  Laterality: N/A;  830  . CORONARY ANGIOPLASTY WITH STENT PLACEMENT  1999- 2009-08/30/2013   "counting today's, I have 5 stents" (08/30/2013)  . CYSTOSCOPY WITH URETHRAL DILATATION N/A 11/26/2012   Procedure: CYSTOSCOPY WITH URETHRAL DILATATION;  Surgeon: Malka So, MD;  Location: [facility] ORS;  Service: Urology;  Laterality: N/A;  . LEFT HEART CATHETERIZATION WITH CORONARY ANGIOGRAM N/A 08/30/2013   Procedure: LEFT HEART CATHETERIZATION WITH CORONARY ANGIOGRAM;  Surgeon: Clent Demark, MD;  Location: [facility] CATH LAB;  Service: Cardiovascular;  Laterality: N/A;  . PERCUTANEOUS CORONARY STENT INTERVENTION (PCI-S) Right 08/30/2013   Procedure: PERCUTANEOUS CORONARY STENT INTERVENTION (PCI-S);  Surgeon: Clent Demark, MD;  [facility] C[facility] LAB;  Service: Cardiovascular;  Laterality: Right;   Social History   Social History  . Marital status: Married    Spouse name: N/A  . Number of children: 2  .  Years of education: 11   Occupational History  . janitorial services     Social History Main Topics  . Smoking status: Former Smoker    Packs/day: 0.50    Years: 33.00    Types: Cigarettes    Quit date: 11/29/2002  . Smokeless tobacco: Never Used     Comment: "stopped smoking in the 1990's  . Alcohol use No  . Drug use: No  . Sexual activity: Not Currently   Other Topics Concern  . None   Social History Narrative   Patient drinks about 3 cups of soda daily.    Patient is left handed.    Outpatient Encounter Prescriptions as of 07/14/2016  Medication Sig  . levothyroxine (SYNTHROID, LEVOTHROID) 88 MCG tablet Take 1 tablet (88 mcg total) by mouth every morning.  . [DISCONTINUED] levothyroxine (SYNTHROID, LEVOTHROID) 75 MCG tablet TAKE 1 TABLET EVERY MORNING  . ACCU-CHEK AVIVA PLUS test strip FOUR TIMES A DAY (Patient taking differently: TWICE A DAY)  . acetaminophen (TYLENOL) 500 MG tablet Take 500 mg by mouth every 6 (six) hours as needed for moderate pain (left thigh).  Marland Kitchen albuterol (PROVENTIL HFA;VENTOLIN HFA) 108 (90 Base) MCG/ACT inhaler Inhale 2 puffs into the lungs every 4 (four) hours as needed for wheezing or shortness of breath.  . allopurinol (ZYLOPRIM) 300 MG tablet TAKE 1 TABLET ONE TIME DAILY  .  amLODipine (NORVASC) 10 MG tablet TAKE 1 TABLET EVERY DAY  . aspirin 325 MG tablet Take 1 tablet (325 mg total) by mouth daily.  . clopidogrel (PLAVIX) 75 MG tablet Take 1 tablet (75 mg total) by mouth daily.  . ergocalciferol (VITAMIN D2) 50000 units capsule Take 1 capsule (50,000 Units total) by mouth once a week. One capsule once weekly  . ezetimibe (ZETIA) 10 MG tablet Take 1 tablet (10 mg total) by mouth daily.  . insulin NPH-regular Human (NOVOLIN 70/30) (70-30) 100 UNIT/ML injection INJECT 40 UNITS SUBCUTANEOUSLY TWICE DAILY  . lisinopril (PRINIVIL,ZESTRIL) 40 MG tablet TAKE 1 TABLET EVERY DAY  . metFORMIN (GLUCOPHAGE) 500 MG tablet TAKE 1 TABLET TWICE DAILY  .  metoprolol (LOPRESSOR) 50 MG tablet Take 0.5 tablets (25 mg total) by mouth 2 (two) times daily.  . pantoprazole (PROTONIX) 40 MG tablet TAKE 1 TABLET EVERY DAY BEFORE BREAKFAST  . pravastatin (PRAVACHOL) 40 MG tablet Take 2 tablets (80 mg total) by mouth daily.  Marland Kitchen senna (SENOKOT) 8.6 MG tablet Take 1 tablet by mouth daily.  Marland Kitchen spironolactone (ALDACTONE) 25 MG tablet Take 1 tablet (25 mg total) by mouth daily.  Marland Kitchen tiotropium (SPIRIVA) 18 MCG inhalation capsule Place 18 mcg into inhaler and inhale daily. Reported on 11/22/2015  . triamcinolone cream (KENALOG) 0.1 % Apply 1 application topically 2 (two) times daily. Apply twice daily to rash on right elbow  , under a moist wrap for 4 weeks  . vitamin B-12 (CYANOCOBALAMIN) 1000 MCG tablet Take 1,000 mcg by mouth daily.  . [DISCONTINUED] hydroxypropyl cellulose (LACRISERT) 5 MG INST Place 5 mg into the left eye at bedtime. (Patient not taking: Reported on 04/18/2016)  . [DISCONTINUED] levETIRAcetam (KEPPRA) 500 MG tablet Take 1 tablet (500 mg total) by mouth 2 (two) times daily.   No facility-administered encounter medications on file as of 07/14/2016.    ALLERGIES: No Known Allergies VACCINATION STATUS: Immunization History  Administered Date(s) Administered  . Influenza Whole 03/18/2005  . Pneumococcal Conjugate-13 12/08/2013  . Pneumococcal Polysaccharide-23 06/09/2012  . Td 12/04/2003    Diabetes  He presents for his follow-up diabetic visit. He has type 2 diabetes mellitus. Onset time: He was diagnosed at approximate age of 52 years. His disease course has been improving. There are no hypoglycemic associated symptoms. Pertinent negatives for hypoglycemia include no confusion, headaches, pallor or seizures. Pertinent negatives for diabetes include no chest pain, no fatigue, no polydipsia, no polyphagia, no polyuria and no weakness. Symptoms are improving. Diabetic complications include a CVA, heart disease, nephropathy and peripheral neuropathy.  Risk factors for coronary artery disease include dyslipidemia, diabetes mellitus, male sex, obesity, tobacco exposure and sedentary lifestyle. Current diabetic treatment includes insulin injections. He is compliant with treatment most of the time. His weight is increasing steadily. He is following a generally unhealthy diet. When asked about meal planning, he reported none. Exercise: Currently undergoing physical therapy after recent cerebrovascular accident. His breakfast blood glucose range is generally 140-180 mg/dl. His highest blood glucose is 140-180 mg/dl. His overall blood glucose range is 140-180 mg/dl. An ACE inhibitor/angiotensin II receptor blocker is being taken.  Hyperlipidemia  This is a chronic problem. The current episode started more than 1 year ago. The problem is controlled. Exacerbating diseases include diabetes, hypothyroidism and obesity. Pertinent negatives include no chest pain, myalgias or shortness of breath. Current antihyperlipidemic treatment includes statins. Risk factors for coronary artery disease include diabetes mellitus, dyslipidemia, hypertension, male sex and a sedentary lifestyle.  Hypertension  This  is a chronic problem. The current episode started more than 1 year ago. The problem is controlled. Pertinent negatives include no chest pain, headaches, neck pain, palpitations or shortness of breath. Past treatments include ACE inhibitors. Hypertensive end-organ damage includes CAD/MI and CVA. Identifiable causes of hypertension include a thyroid problem.  Thyroid Problem  Presents for follow-up visit. Patient reports no constipation, diarrhea, fatigue or palpitations. The symptoms have been stable. Past treatments include levothyroxine. His past medical history is significant for diabetes and hyperlipidemia.     Review of Systems  Constitutional: Negative for chills, fatigue, fever and unexpected weight change.  HENT: Negative for dental problem, mouth sores and  trouble swallowing.   Eyes: Negative for visual disturbance.  Respiratory: Negative for cough, choking, chest tightness, shortness of breath and wheezing.   Cardiovascular: Negative for chest pain, palpitations and leg swelling.  Gastrointestinal: Negative for abdominal distention, abdominal pain, constipation, diarrhea, nausea and vomiting.  Endocrine: Negative for polydipsia, polyphagia and polyuria.  Genitourinary: Negative for dysuria, flank pain, hematuria and urgency.  Musculoskeletal: Positive for gait problem. Negative for back pain, myalgias and neck pain.       He walks with a walker due to recent CVA.  Skin: Negative for pallor, rash and wound.  Neurological: Negative for seizures, syncope, weakness, numbness and headaches.  Psychiatric/Behavioral: Negative.  Negative for confusion and dysphoric mood.    Objective:    BP (!) 157/74   Pulse 80   Ht 5\' 10"  (1.778 m)   Wt 251 lb (113.9 kg)   BMI 36.01 kg/m   Wt Readings from Last 3 Encounters:  07/14/16 251 lb (113.9 kg)  07/01/16 247 lb (112 kg)  07/01/16 247 lb (112 kg)    Physical Exam  Constitutional: He is oriented to person, place, and time. He appears well-developed and well-nourished. He is cooperative. No distress.  HENT:  Head: Normocephalic and atraumatic.  Eyes: EOM are normal.  Neck: Normal range of motion. Neck supple. No tracheal deviation present. No thyromegaly present.  Cardiovascular: Normal rate, S1 normal, S2 normal and normal heart sounds.  Exam reveals no gallop.   No murmur heard. Pulses:      Dorsalis pedis pulses are 1+ on the right side, and 1+ on the left side.       Posterior tibial pulses are 1+ on the right side, and 1+ on the left side.  Pulmonary/Chest: Breath sounds normal. No respiratory distress. He has no wheezes.  Abdominal: Soft. Bowel sounds are normal. He exhibits no distension. There is no tenderness. There is no guarding and no CVA tenderness.  Musculoskeletal: He exhibits  edema.       Right shoulder: He exhibits no swelling and no deformity.   Mild bilateral pedal and lower leg edema.  Neurological: He is alert and oriented to person, place, and time. He has normal strength and normal reflexes. No cranial nerve deficit or sensory deficit. Gait normal.  Skin: Skin is warm and dry. No rash noted. No cyanosis. Nails show no clubbing.  Psychiatric: He has a normal mood and affect. His speech is normal and behavior is normal. Judgment and thought content normal. Cognition and memory are normal.    CMP     Component Value Date/Time   NA 141 07/08/2016 0829   K 4.3 07/08/2016 0829   CL 105 07/08/2016 0829   CO2 26 07/08/2016 0829   GLUCOSE 109 (H) 07/08/2016 0829   BUN 16 07/08/2016 0829   CREATININE 1.12 07/08/2016 0829  CALCIUM 9.1 07/08/2016 0829   PROT 6.9 07/08/2016 0829   ALBUMIN 3.9 07/08/2016 0829   AST 7 (L) 07/08/2016 0829   ALT 5 (L) 07/08/2016 0829   ALKPHOS 69 07/08/2016 0829   BILITOT 0.3 07/08/2016 0829   GFRNONAA >60 06/09/2016 0056   GFRNONAA 58 (L) 01/15/2016 1021   GFRAA >60 06/09/2016 0056   GFRAA 68 01/15/2016 1021     Diabetic Labs (most recent): Lab Results  Component Value Date   HGBA1C 6.6 (H) 07/08/2016   HGBA1C 6.0 (H) 01/15/2016   HGBA1C 6.1 (H) 10/06/2015     Lipid Panel ( most recent) Lipid Panel     Component Value Date/Time   CHOL 135 07/08/2016 0827   TRIG 241 (H) 07/08/2016 0827   HDL 24 (L) 07/08/2016 0827   CHOLHDL 5.6 (H) 07/08/2016 0827   VLDL 48 (H) 07/08/2016 0827   LDLCALC 63 07/08/2016 0827   LDLDIRECT 50 08/18/2014 0737      Assessment & Plan:   1. Type 2 diabetes mellitus with diabetic nephropathy, unspecified long term insulin use status (HCC) -His diabetes is  complicated by coronary artery disease, CVA, CKD and patient remains at a high risk for more acute and chronic complications of diabetes which include CAD, CVA, CKD, retinopathy, and neuropathy. These are all discussed in detail  with the patient.  Patient came with his meter and log of glucose profile, and  recent A1c of 6.6%.   -  Recent labs reviewed.  - I have re-counseled the patient on diet management and weight loss  by adopting a carbohydrate restricted / protein rich  Diet.  - Suggestion is made for patient to avoid simple carbohydrates   from their diet including Cakes , Desserts, Ice Cream,  Soda (  diet and regular) , Sweet Tea , Candies,  Chips, Cookies, Artificial Sweeteners,   and "Sugar-free" Products .  This will help patient to have stable blood glucose profile and potentially avoid unintended  Weight gain.  - Patient is advised to stick to a routine mealtimes to eat 3 meals  a day and avoid unnecessary snacks ( to snack only to correct hypoglycemia).  - The patient  has been  scheduled with Jearld Fenton, RDN, CDE for individualized DM education.  - I have approached patient with the following individualized plan to manage diabetes and patient agrees.  - I will continue with premixed insulin Novolin 70/30 40 units with breakfast and 40 units with supper for pre-meal glucose of greater than 90 mg/dL. He is advised to continue strict  monitoring of glucose  AC and HS. - Patient is warned not to take insulin without proper monitoring per orders. -Adjustment parameters are given for hypo and hyperglycemia in writing. -Patient is encouraged to call clinic for blood glucose levels less than 70 or above 300 mg /dl. - I will continue metformin 500 mg by mouth twice a day, therapeutically suitable for patient.   - Patient specific target  for A1c; LDL, HDL, Triglycerides, and  Waist Circumference were discussed in detail.  2) BP/HTN: uncontrolled. Continue current medications including ACEI/ARB. 3) Lipids/HPL:  continue statins. 4)  Weight/Diet: CDE consult in progress, exercise, and carbohydrates information provided.  5) hypothyroidism: He reports that he ran out of his levothyroxine for more than a  month. His labs are indicated. Under replacement. I advised him to resume his levothyroxine at 88 g by mouth every morning.   - We discussed about correct intake of levothyroxine, at  fasting, with water, separated by at least 30 minutes from breakfast, and separated by more than 4 hours from calcium, iron, multivitamins, acid reflux medications (PPIs). -Patient is made aware of the fact that thyroid hormone replacement is needed for life, dose to be adjusted by periodic monitoring of thyroid function tests.  6) Chronic Care/Health Maintenance:  -Patient is on ACEI/ARB and Statin medications and encouraged to continue to follow up with Ophthalmology, Podiatrist at least yearly or according to recommendations, and advised to  stay away from smoking. I have recommended yearly flu vaccine and pneumonia vaccination at least every 5 years; moderate intensity exercise for up to 150 minutes weekly; and  sleep for at least 7 hours a day. - I advised him to pick up compression stockings to wear on bilateral lower extremities during the times, to help with dependent edema on bilateral lower extremities. - 25 minutes of time was spent on the care of this patient , 50% of which was applied for counseling on diabetes complications and their preventions.  - I advised patient to maintain close follow up with Tula Nakayama, MD for primary care needs.  Patient is asked to bring meter and  blood glucose logs during their next visit.   Follow up plan: -Return in about 3 months (around 10/11/2016) for meter, and logs.  Glade Lloyd, MD Phone: [phone]  Fax: [phone]   07/14/2016, 1:29 PM

## 2016-07-14 NOTE — Patient Instructions (Signed)

## 2016-07-14 NOTE — Telephone Encounter (Signed)
Corrected result note re lipids, triglycerides are too high  Cut back on fried and fatty foods.  Additional medication is being added zetia 10 mg daily, (if able to afford)  Vit D message and need for f/ u labs is correct

## 2016-07-15 ENCOUNTER — Other Ambulatory Visit: Payer: Self-pay | Admitting: Family Medicine

## 2016-07-15 DIAGNOSIS — Z8673 Personal history of transient ischemic attack (TIA), and cerebral infarction without residual deficits: Secondary | ICD-10-CM | POA: Diagnosis not present

## 2016-07-15 DIAGNOSIS — G4733 Obstructive sleep apnea (adult) (pediatric): Secondary | ICD-10-CM | POA: Diagnosis not present

## 2016-07-15 MED ORDER — LACRISERT 5 MG OP INST: 5.0000 mg | VAGINAL_INSERT | Freq: Every day | OPHTHALMIC | 1 refills | Status: AC

## 2016-07-18 ENCOUNTER — Other Ambulatory Visit: Payer: Self-pay

## 2016-07-18 DIAGNOSIS — Z125 Encounter for screening for malignant neoplasm of prostate: Secondary | ICD-10-CM

## 2016-07-18 DIAGNOSIS — E785 Hyperlipidemia, unspecified: Secondary | ICD-10-CM

## 2016-07-18 DIAGNOSIS — E1142 Type 2 diabetes mellitus with diabetic polyneuropathy: Secondary | ICD-10-CM

## 2016-07-18 DIAGNOSIS — E559 Vitamin D deficiency, unspecified: Secondary | ICD-10-CM

## 2016-07-18 MED ORDER — EZETIMIBE 10 MG PO TABS: 10.0000 mg | ORAL_TABLET | Freq: Every day | ORAL | 5 refills | Status: DC

## 2016-07-18 MED ORDER — ERGOCALCIFEROL 1.25 MG (50000 UT) PO CAPS: 50000.0000 [IU] | ORAL_CAPSULE | ORAL | 1 refills | Status: DC

## 2016-07-18 NOTE — Telephone Encounter (Signed)
Pt aware, meds sent and labs mailed to him

## 2016-07-21 ENCOUNTER — Encounter: Payer: Self-pay | Admitting: Family Medicine

## 2016-07-26 ENCOUNTER — Emergency Department (HOSPITAL_COMMUNITY)
Admission: EM | Admit: 2016-07-26 | Discharge: 2016-07-26 | Disposition: A | Discharge status: Home / Self Care | Payer: Medicare HMO | Attending: Emergency Medicine | Admitting: Emergency Medicine

## 2016-07-26 ENCOUNTER — Emergency Department (HOSPITAL_COMMUNITY): Payer: Medicare HMO

## 2016-07-26 ENCOUNTER — Encounter (HOSPITAL_COMMUNITY): Payer: Self-pay | Admitting: Emergency Medicine

## 2016-07-26 DIAGNOSIS — I2510 Atherosclerotic heart disease of native coronary artery without angina pectoris: Secondary | ICD-10-CM | POA: Diagnosis not present

## 2016-07-26 DIAGNOSIS — R42 Dizziness and giddiness: Secondary | ICD-10-CM | POA: Insufficient documentation

## 2016-07-26 DIAGNOSIS — Z79899 Other long term (current) drug therapy: Secondary | ICD-10-CM | POA: Diagnosis not present

## 2016-07-26 DIAGNOSIS — Z7982 Long term (current) use of aspirin: Secondary | ICD-10-CM | POA: Diagnosis not present

## 2016-07-26 DIAGNOSIS — R531 Weakness: Secondary | ICD-10-CM | POA: Diagnosis not present

## 2016-07-26 DIAGNOSIS — E039 Hypothyroidism, unspecified: Secondary | ICD-10-CM | POA: Diagnosis not present

## 2016-07-26 DIAGNOSIS — N182 Chronic kidney disease, stage 2 (mild): Secondary | ICD-10-CM | POA: Insufficient documentation

## 2016-07-26 DIAGNOSIS — I129 Hypertensive chronic kidney disease with stage 1 through stage 4 chronic kidney disease, or unspecified chronic kidney disease: Secondary | ICD-10-CM | POA: Insufficient documentation

## 2016-07-26 DIAGNOSIS — I252 Old myocardial infarction: Secondary | ICD-10-CM | POA: Diagnosis not present

## 2016-07-26 DIAGNOSIS — Z794 Long term (current) use of insulin: Secondary | ICD-10-CM | POA: Insufficient documentation

## 2016-07-26 DIAGNOSIS — Z87891 Personal history of nicotine dependence: Secondary | ICD-10-CM | POA: Diagnosis not present

## 2016-07-26 DIAGNOSIS — J449 Chronic obstructive pulmonary disease, unspecified: Secondary | ICD-10-CM | POA: Diagnosis not present

## 2016-07-26 DIAGNOSIS — E1122 Type 2 diabetes mellitus with diabetic chronic kidney disease: Secondary | ICD-10-CM | POA: Insufficient documentation

## 2016-07-26 LAB — COMPREHENSIVE METABOLIC PANEL
ALT: 11 U/L — AB (ref 17–63)
ANION GAP: 8 (ref 5–15)
AST: 13 U/L — ABNORMAL LOW (ref 15–41)
Albumin: 3.7 g/dL (ref 3.5–5.0)
Alkaline Phosphatase: 61 U/L (ref 38–126)
BUN: 20 mg/dL (ref 6–20)
CHLORIDE: 102 mmol/L (ref 101–111)
CO2: 26 mmol/L (ref 22–32)
Calcium: 9.3 mg/dL (ref 8.9–10.3)
Creatinine, Ser: 1.11 mg/dL (ref 0.61–1.24)
GFR calc non Af Amer: >60 mL/min (ref >60)
Glucose, Bld: 157 mg/dL — ABNORMAL HIGH (ref 65–99)
POTASSIUM: 4.3 mmol/L (ref 3.5–5.1)
SODIUM: 136 mmol/L (ref 135–145)
Total Bilirubin: 0.3 mg/dL (ref 0.3–1.2)
Total Protein: 7.1 g/dL (ref 6.5–8.1)

## 2016-07-26 LAB — CBC
HCT: 32.3 % — ABNORMAL LOW (ref 39.0–52.0)
Hemoglobin: 10.4 g/dL — ABNORMAL LOW (ref 13.0–17.0)
MCH: 27.2 pg (ref 26.0–34.0)
MCHC: 32.2 g/dL (ref 30.0–36.0)
MCV: 84.3 fL (ref 78.0–100.0)
PLATELETS: 262 10*3/uL (ref 150–400)
RBC: 3.83 MIL/uL — AB (ref 4.22–5.81)
RDW: 15 % (ref 11.5–15.5)
WBC: 12.4 10*3/uL — ABNORMAL HIGH (ref 4.0–10.5)

## 2016-07-26 MED ORDER — MECLIZINE HCL 25 MG PO TABS: 25.0000 mg | ORAL_TABLET | Freq: Three times a day (TID) | ORAL | 0 refills | Status: DC | PRN

## 2016-07-26 MED ORDER — MECLIZINE HCL 12.5 MG PO TABS
25.0000 mg | ORAL_TABLET | Freq: Once | ORAL | Status: AC
  Administered 2016-07-26: 25 mg via ORAL
  Filled 2016-07-26: qty 2

## 2016-07-26 NOTE — ED Notes (Signed)
Pt ambulated without any dizziness.  Pt SOB and O2 dropped to 91%.  Informed Dr. Venora Maples.  Instructed to discharge pt as planned

## 2016-07-26 NOTE — ED Triage Notes (Signed)
Pt reports having havin a weird feeling in his head, bilateral shoulder pain, bilateral leg numbness. Pt saying he feels like the room is spinning.

## 2016-07-26 NOTE — ED Provider Notes (Signed)
[name], [name], [name], John that this documentation has been prepared under the direction and in the presence of Jola [name], [name], Scribe. 07/26/16. 8:31 PM.    History   Chief Complaint Chief Complaint  Patient presents with  . Dizziness    HPI DAVISON OHMS is a 76 y.o. male with h/o Mi, COPD, CVA, HTN, HLD who presents to the Emergency Department complaining of resolved dizziness described as "everything moving in front of him" that has worsened earlier this evening. The dizziness is exacerbated by looking around. Last recognized normal was around 7pm tonight by his wife. Pt mild subjective change in his baseline weakness of his right side. Also reports right shoulder pain, numbness in his feet bilaterally. Pt has h/o CVA in 2017 with residual right sided symptoms. Pt takes ASA daily. Per wife, pt's speech sounds normal. Pt denies nausea, fever, chills.    The history is provided by the patient. No language interpreter was used.    Past Medical History:  Diagnosis Date  . Abnormality of gait 05/01/2015  . Arthritis    "left leg" (08/30/2013)  . Arthritis of knee   . CAD (coronary artery disease)   . Chronic back pain   . Chronic bronchitis ([place])    "get it q yr"  . COPD (chronic obstructive pulmonary disease) ([place])   . CVA (cerebral infarction)   . Diabetes mellitus, type 1   . GERD (gastroesophageal reflux disease)   . Gout   . Hyperlipidemia   . Hypertension   . Myocardial infarction 1999  . Obesity   . On home oxygen therapy    "2L; only at night" (08/30/2013)  . Pneumonia    "a few times; last time was ~ 06/2013"  . Psoriasis   . Stroke ([place])   . Tussive syncope 05/01/2015  . Vitamin B12 deficiency 05/01/2015    Patient Active Problem List   Diagnosis Date Noted  . Choking 07/13/2015  . History of CVA  (cerebrovascular accident) 06/20/2015  . CKD (chronic kidney disease) stage 2, GFR 60-89 ml/min 06/04/2015  . Hypothyroidism 06/04/2015  . Back pain 06/01/2015  . Facial droop 05/15/2015  . Dysarthria 05/15/2015  . Partial seizure ([place]) 05/12/2015  . Right hemiparesis ([place]) 05/12/2015  . Stroke ([place]) 05/10/2015  . GERD (gastroesophageal reflux disease) 05/10/2015  . CVA (cerebral infarction) 05/10/2015  . Vitamin B12 deficiency 05/01/2015  . Abnormality of gait 05/01/2015  . Tussive syncope 05/01/2015  . Recurrent falls 04/29/2015  . Dysphagia 04/26/2015  . COPD exacerbation ([place]) 04/19/2015  . Anemia 04/19/2015  . COPD with acute exacerbation ([place]) 02/11/2015  . Carotid bruit 12/08/2013  . Angina pectoris ([place]) 08/30/2013  . Type 2 diabetes mellitus with diabetic nephropathy ([place]) 05/22/2013  . COPD (chronic obstructive pulmonary disease) ([place]) 11/22/2012  . Sleep related hypoxia 08/30/2012  . Vitamin D deficiency 12/23/2011  . PSORIASIS 07/18/2010  . Gout 01/13/2010  . Type 2 diabetes mellitus with polyneuropathy ([place]) 11/28/2008  . Hyperlipidemia 10/05/2007  . Obesity (BMI 30.0-34.9) 10/05/2007  . Essential hypertension 10/05/2007  . Coronary atherosclerosis 10/05/2007  . GERD 10/05/2007  . Arthritis of knee, right 10/05/2007    Past Surgical History:  Procedure Laterality Date  . APPENDECTOMY    . COLONOSCOPY  03/17/2012   Procedure: COLONOSCOPY;  Surgeon: Rogene Houston, MD;  Location: [facility];  Service: Endoscopy;  Laterality: N/A;  830  . CORONARY ANGIOPLASTY WITH STENT PLACEMENT  1999- 2009-08/30/2013   "counting today's, [name] have 5 stents" (08/30/2013)  . CYSTOSCOPY WITH URETHRAL DILATATION N/A 11/26/2012   Procedure: CYSTOSCOPY WITH URETHRAL DILATATION;  Surgeon: Malka So, MD;  Location: [facility] ORS;  Service: Urology;  Laterality: N/A;  . LEFT HEART CATHETERIZATION WITH CORONARY ANGIOGRAM N/A 08/30/2013   Procedure: LEFT HEART CATHETERIZATION WITH CORONARY  ANGIOGRAM;  Surgeon: Clent Demark, MD;  Location: [facility] CATH LAB;  Service: Cardiovascular;  Laterality: N/A;  . PERCUTANEOUS CORONARY STENT INTERVENTION (PCI-S) Right 08/30/2013   Procedure: PERCUTANEOUS CORONARY STENT INTERVENTION (PCI-S);  Surgeon: Clent Demark, MD;  Location: [facility] CATH LAB;  Service: Cardiovascular;  Laterality: Right;       Home Medications    Prior to Admission medications   Medication Sig Start Date End Date Taking? Authorizing Provider  ACCU-CHEK AVIVA PLUS test strip FOUR TIMES A DAY Patient taking differently: TWICE A DAY 06/06/16   Cassandria Anger, MD  acetaminophen (TYLENOL) 500 MG tablet Take 500 mg by mouth every 6 (six) hours as needed for moderate pain (left thigh).    Historical Provider, MD  albuterol (PROVENTIL HFA;VENTOLIN HFA) 108 (90 Base) MCG/ACT inhaler Inhale 2 puffs into the lungs every 4 (four) hours as needed for wheezing or shortness of breath. 06/22/15   Kristen N Ward, DO  allopurinol (ZYLOPRIM) 300 MG tablet TAKE 1 TABLET ONE TIME DAILY 09/18/15   Sanjuana Kava, MD  amLODipine (NORVASC) 10 MG tablet TAKE 1 TABLET EVERY DAY 06/06/16   Fayrene Helper, MD  aspirin 325 MG tablet Take 1 tablet (325 mg total) by mouth daily. 05/12/15   Kathie Dike, MD  clopidogrel (PLAVIX) 75 MG tablet Take 1 tablet (75 mg total) by mouth daily. 07/01/16   Fayrene Helper, MD  ergocalciferol (VITAMIN D2) 50000 units capsule Take 1 capsule (50,000 Units total) by mouth once a week. One capsule once weekly 07/18/16   Fayrene Helper, MD  ezetimibe (ZETIA) 10 MG tablet Take 1 tablet (10 mg total) by mouth daily. 07/18/16   Fayrene Helper, MD  hydroxypropyl cellulose (LACRISERT) 5 MG INST Place 5 mg into the left eye at bedtime. 07/15/16 01/11/17  Fayrene Helper, MD  insulin NPH-regular Human (NOVOLIN 70/30) (70-30) 100 UNIT/ML injection INJECT 40 UNITS SUBCUTANEOUSLY TWICE DAILY 05/05/16   Cassandria Anger, MD  levothyroxine (SYNTHROID, LEVOTHROID) 88  MCG tablet Take 1 tablet (88 mcg total) by mouth every morning. 07/14/16   Cassandria Anger, MD  lisinopril (PRINIVIL,ZESTRIL) 40 MG tablet TAKE 1 TABLET EVERY DAY 06/06/16   Fayrene Helper, MD  metFORMIN (GLUCOPHAGE) 500 MG tablet TAKE 1 TABLET TWICE DAILY 06/06/16   Cassandria Anger, MD  metoprolol (LOPRESSOR) 50 MG tablet Take 0.5 tablets (25 mg total) by mouth 2 (two) times daily. 07/01/16   Fayrene Helper, MD  pantoprazole (PROTONIX) 40 MG tablet TAKE 1 TABLET EVERY DAY BEFORE BREAKFAST 07/01/16   Fayrene Helper, MD  pravastatin (PRAVACHOL) 40 MG tablet Take 2 tablets (80 mg total) by mouth daily. 07/01/16   Fayrene Helper, MD  senna (SENOKOT) 8.6 MG tablet Take 1 tablet by mouth daily.    Historical Provider, MD  spironolactone (ALDACTONE) 25 MG tablet Take 1 tablet (25 mg total) by mouth daily. 07/01/16   Fayrene Helper, MD  tiotropium (SPIRIVA) 18 MCG inhalation capsule Place 18 mcg into inhaler and inhale daily. Reported on  11/22/2015    Historical Provider, MD  triamcinolone cream (KENALOG) 0.1 % Apply 1 application topically 2 (two) times daily. Apply twice daily to rash on right elbow  , under a moist wrap for 4 weeks 09/19/15   Fayrene Helper, MD  vitamin B-12 (CYANOCOBALAMIN) 1000 MCG tablet Take 1,000 mcg by mouth daily.    Historical Provider, MD    Family History Family History  Problem Relation Age of Onset  . Hypertension Mother   . Diabetes Mother   . Hypertension Father   . Diabetes Brother   . Stroke Daughter   . Arthritis      Family History   . Diabetes      family History     Social History Social History  Substance Use Topics  . Smoking status: Former Smoker    Packs/day: 0.50    Years: 33.00    Types: Cigarettes    Quit date: 11/29/2002  . Smokeless tobacco: Never Used     Comment: "stopped smoking in the 1990's  . Alcohol use No     Allergies   Patient has no known allergies.   Review of Systems Review of Systems  A  complete 10 system review of systems was obtained and all systems are negative except as noted in the HPI and PMH.    Physical Exam Updated Vital Signs BP 158/80 (BP Location: Left Arm)   Pulse 84   Temp 98.7 F (37.1 C) (Oral)   Resp 20   Ht 5\' 10"  (1.778 m)   Wt 251 lb (113.9 kg)   SpO2 93%   BMI 36.01 kg/m   Physical Exam  Constitutional: He is oriented to person, place, and time. He appears well-developed and well-nourished.  HENT:  Head: Normocephalic and atraumatic.  Eyes: EOM are normal. Pupils are equal, round, and reactive to light.  Neck: Normal range of motion.  Cardiovascular: Normal rate, regular rhythm, normal heart sounds and intact distal pulses.   Pulmonary/Chest: Effort normal and breath sounds normal. No respiratory distress.  Abdominal: Soft. He exhibits no distension. There is no tenderness.  Musculoskeletal: Normal range of motion.  Neurological: He is alert and oriented to person, place, and time.  5/5 strength in major muscle groups of left upper and lower extremities. Speech normal. No facial asymetry. 4+/5 strength of right upper and lower extremity muscle groups.  Skin: Skin is warm and dry.  Psychiatric: He has a normal mood and affect. Judgment normal.  Nursing note and vitals reviewed.    ED Treatments / Results   DIAGNOSTIC STUDIES: Oxygen Saturation is 93% on room air, low by my interpretation.    COORDINATION OF CARE: 8:29 PM Discussed treatment plan with pt at bedside and pt agreed to plan, which include CT of head and normal labs.    Labs (all labs ordered are listed, but only abnormal results are displayed) Labs Reviewed  CBC - Abnormal; Notable for the following:       Result Value   WBC 12.4 (*)    RBC 3.83 (*)    Hemoglobin 10.4 (*)    HCT 32.3 (*)    All other components within normal limits  COMPREHENSIVE METABOLIC PANEL - Abnormal; Notable for the following:    Glucose, Bld 157 (*)    AST 13 (*)    ALT 11 (*)    All  other components within normal limits    EKG  EKG Interpretation  Date/Time:  Saturday July 26 2016 20:12:58  EST Ventricular Rate:  81 PR Interval:    QRS Duration: 79 QT Interval:  373 QTC Calculation: 433 R Axis:   -26 Text Interpretation:  Sinus rhythm Borderline prolonged PR interval Borderline left axis deviation Baseline wander in lead(s) V3 No significant change was found Confirmed by Srinivas Lippman  MD, Lennette Bihari ([id number]) on 07/26/2016 8:27:47 PM       Radiology Head CT reviewed  Procedures Procedures (including critical care time)  Medications Ordered in ED Medications  meclizine (ANTIVERT) tablet 25 mg (not administered)     Initial Impression / Assessment and Plan / ED Course  [name] have reviewed the triage vital signs and the nursing notes.  Pertinent labs & imaging results that were available during my care of the patient were reviewed by me and considered in my medical decision making (see chart for details).     10:18 PM Patient is overall well-appearing.  He feels much better after meclizine.  This seems to be more of her peripheral vertigo issue.  Doubt central process.  CT head demonstrates no significant abnormality.  Discharge home in good condition.  The patient will need to continue his Plavix from an antiplatelet standpoint.   Final Clinical Impressions(s) / ED Diagnoses   Final diagnoses:  Vertigo    New Prescriptions New Prescriptions   No medications on file   [name] personally performed the services described in this documentation, which was scribed in my presence. The recorded information has been reviewed and is accurate.        Jola Schmidt, MD 07/26/16 [id number]

## 2016-07-26 NOTE — ED Triage Notes (Signed)
Bilateral shoulder pain for 30 minutes with complaint that his feet are going numb Followed by Dr Moshe Cipro

## 2016-07-26 NOTE — ED Notes (Signed)
Patient on cardiac monitoring at this time. EKG done and given to EDP

## 2016-07-26 NOTE — ED Notes (Signed)
Pt states understanding of care given, follow up instructions and medications.  Pt able to ambulate but only short distances.  Escorted to vehicle in wheelchair.  Transported home by SO

## 2016-07-26 NOTE — ED Triage Notes (Signed)
Dr. Ronald Pippins notified of patient and his symptoms.

## 2016-07-26 NOTE — ED Notes (Signed)
Pt states that the antivert has helped relieve the dizziness

## 2016-07-28 ENCOUNTER — Other Ambulatory Visit: Payer: Self-pay

## 2016-07-28 NOTE — Patient Outreach (Signed)
[facility]) Care Management  07/28/2016  [name] 03-Jan-1941 [id number]   Telephone call to patient for follow up from ER visit.  Patient reports that he is feeling better and that he not having any dizziness.  He states that he was given some medication and then he felt better. He states that he has to pick up his medication today.  Advised patient to follow up with his physician.  He verbalized understanding. Asked patient if he had gotten in contact with [employer] [facility] he said he did and that he used them to take him to doctor's appointment.    Plan: RN Health Coach will contact patient in the month of April and patient agrees to next outreach.  Jone Baseman, RN, MSN [place] [phone]

## 2016-08-11 ENCOUNTER — Other Ambulatory Visit: Payer: Self-pay | Admitting: "Endocrinology

## 2016-08-12 DIAGNOSIS — G4733 Obstructive sleep apnea (adult) (pediatric): Secondary | ICD-10-CM | POA: Diagnosis not present

## 2016-08-12 DIAGNOSIS — Z8673 Personal history of transient ischemic attack (TIA), and cerebral infarction without residual deficits: Secondary | ICD-10-CM | POA: Diagnosis not present

## 2016-08-18 ENCOUNTER — Ambulatory Visit: Payer: Self-pay

## 2016-08-20 ENCOUNTER — Other Ambulatory Visit: Payer: Self-pay

## 2016-08-20 NOTE — Patient Outreach (Signed)
[facility]) Care Management  [place]  08/20/2016   [name] 1940-11-30 [id number]  Subjective: Telephone call to patient for every other month call.  Patient reports he is doing well.  Patient denies any more problems with dizziness.  He reports that his blood sugar this morning was 154.  Patient denies any problems with his breathing and that he is using his inhaler. Reiterated to patient COPD action plan and seeking help. He verbalized understanding.    Objective:   Encounter Medications:  Outpatient Encounter Prescriptions as of 08/20/2016  Medication Sig Note  . ACCU-CHEK AVIVA PLUS test strip FOUR TIMES A DAY (Patient taking differently: TWICE A DAY)   . acetaminophen (TYLENOL) 500 MG tablet Take 500 mg by mouth every 6 (six) hours as needed for moderate pain (left thigh).   Marland Kitchen albuterol (PROVENTIL HFA;VENTOLIN HFA) 108 (90 Base) MCG/ACT inhaler Inhale 2 puffs into the lungs every 4 (four) hours as needed for wheezing or shortness of breath.   . allopurinol (ZYLOPRIM) 300 MG tablet TAKE 1 TABLET ONE TIME DAILY   . amLODipine (NORVASC) 10 MG tablet TAKE 1 TABLET EVERY DAY   . aspirin 325 MG tablet Take 1 tablet (325 mg total) by mouth daily.   . clopidogrel (PLAVIX) 75 MG tablet Take 1 tablet (75 mg total) by mouth daily.   . ergocalciferol (VITAMIN D2) 50000 units capsule Take 1 capsule (50,000 Units total) by mouth once a week. One capsule once weekly   . ezetimibe (ZETIA) 10 MG tablet Take 1 tablet (10 mg total) by mouth daily.   . insulin NPH-regular Human (NOVOLIN 70/30) (70-30) 100 UNIT/ML injection INJECT 40 UNITS SUBCUTANEOUSLY TWICE DAILY   . levETIRAcetam (KEPPRA) 500 MG tablet Take 1 tablet by mouth 2 (two) times daily.   Marland Kitchen levothyroxine (SYNTHROID, LEVOTHROID) 88 MCG tablet Take 1 tablet (88 mcg total) by mouth every morning.   Marland Kitchen lisinopril (PRINIVIL,ZESTRIL) 40 MG tablet TAKE 1 TABLET EVERY DAY   . meclizine (ANTIVERT) 25 MG tablet Take 1  tablet (25 mg total) by mouth 3 (three) times daily as needed for dizziness.   . metFORMIN (GLUCOPHAGE) 500 MG tablet TAKE 1 TABLET TWICE DAILY   . metoprolol (LOPRESSOR) 50 MG tablet Take 0.5 tablets (25 mg total) by mouth 2 (two) times daily.   . pantoprazole (PROTONIX) 40 MG tablet TAKE 1 TABLET EVERY DAY BEFORE BREAKFAST   . pravastatin (PRAVACHOL) 40 MG tablet Take 2 tablets (80 mg total) by mouth daily.   Marland Kitchen senna (SENOKOT) 8.6 MG tablet Take 1 tablet by mouth daily. 04/18/2016: Take as needed  . spironolactone (ALDACTONE) 25 MG tablet Take 1 tablet (25 mg total) by mouth daily.   Marland Kitchen tiotropium (SPIRIVA) 18 MCG inhalation capsule Place 18 mcg into inhaler and inhale daily. Reported on 11/22/2015 10/25/2015: Patient reports doctor asked him to hold.   . triamcinolone cream (KENALOG) 0.1 % Apply 1 application topically 2 (two) times daily. Apply twice daily to rash on right elbow  , under a moist wrap for 4 weeks 04/18/2016: As needed  . vitamin B-12 (CYANOCOBALAMIN) 1000 MCG tablet Take 1,000 mcg by mouth daily.   . hydroxypropyl cellulose (LACRISERT) 5 MG INST Place 5 mg into the left eye at bedtime. (Patient not taking: Reported on 07/26/2016)    No facility-administered encounter medications on file as of 08/20/2016.     Functional Status:  In your present state of health, do you have any difficulty performing the following  activities: 07/01/2016 09/26/2015  Hearing? N N  Vision? Y N  Difficulty concentrating or making decisions? [name]  Walking or climbing stairs? Y Y  Dressing or bathing? N Y  Doing errands, shopping? [name]  Preparing Food and eating ? N Y  Using the Toilet? N N  In the past six months, have you accidently leaked urine? N Y  Do you have problems with loss of bowel control? N N  Managing your Medications? Y Y  Managing your Finances? [name]  Housekeeping or managing your Housekeeping? [name]  Some recent data might be hidden    Fall/Depression Screening: PHQ 2/9 Scores 08/20/2016  07/14/2016 07/01/2016 06/19/2016 04/18/2016 03/24/2016 03/20/2016  PHQ - 2 Score 0 0 0 0 0 0 0    Assessment: Patient continues to benefit from health coach outreach for disease management and support.    Plan:  [facility] CM Care Plan Problem One     Most Recent Value  Care Plan Problem One  Dizziness  Role Documenting the Problem One  [place] for Problem One  Active  [facility] CM Short Term Goal #1 (0-30 days)  Patient will report managing dizziness and no falls within the next 30 days.    [facility] CM Short Term Goal #1 Start Date  07/28/16  [facility] CM Short Term Goal #1 Met Date  08/20/16  Interventions for Short Term Goal #1  no falls or dizziness    [facility] CM Care Plan Problem Two     Most Recent Value  Care Plan Problem Two  COPD, knowledge deficit  Role Documenting the Problem Two  [place] for Problem Two  Active  Interventions for Problem Two Long Term Goal   RN Health Coach reiterated with patient COPD action plan zones and when to notify physician.    [facility] Long Term Goal (31-90) days  Patient will not have COPD exacerbation in the next 60 days  [facility] Long Term Goal Start Date  08/20/16 [facility] continued]     [employer] will contact patient in the month of June and patient agrees to next outreach.  Jone Baseman, RN, MSN [place] [phone]

## 2016-08-29 ENCOUNTER — Emergency Department (HOSPITAL_COMMUNITY): Payer: Medicare HMO

## 2016-08-29 ENCOUNTER — Emergency Department (HOSPITAL_COMMUNITY)
Admission: EM | Admit: 2016-08-29 | Discharge: 2016-08-29 | Disposition: A | Discharge status: Home / Self Care | Payer: Medicare HMO | Attending: Emergency Medicine | Admitting: Emergency Medicine

## 2016-08-29 ENCOUNTER — Encounter (HOSPITAL_COMMUNITY): Payer: Self-pay | Admitting: Emergency Medicine

## 2016-08-29 DIAGNOSIS — I2510 Atherosclerotic heart disease of native coronary artery without angina pectoris: Secondary | ICD-10-CM | POA: Diagnosis not present

## 2016-08-29 DIAGNOSIS — E1122 Type 2 diabetes mellitus with diabetic chronic kidney disease: Secondary | ICD-10-CM | POA: Diagnosis not present

## 2016-08-29 DIAGNOSIS — Y999 Unspecified external cause status: Secondary | ICD-10-CM | POA: Insufficient documentation

## 2016-08-29 DIAGNOSIS — Z87891 Personal history of nicotine dependence: Secondary | ICD-10-CM | POA: Diagnosis not present

## 2016-08-29 DIAGNOSIS — Y939 Activity, unspecified: Secondary | ICD-10-CM | POA: Diagnosis not present

## 2016-08-29 DIAGNOSIS — I252 Old myocardial infarction: Secondary | ICD-10-CM | POA: Insufficient documentation

## 2016-08-29 DIAGNOSIS — W1839XA Other fall on same level, initial encounter: Secondary | ICD-10-CM | POA: Diagnosis not present

## 2016-08-29 DIAGNOSIS — Y929 Unspecified place or not applicable: Secondary | ICD-10-CM | POA: Insufficient documentation

## 2016-08-29 DIAGNOSIS — I129 Hypertensive chronic kidney disease with stage 1 through stage 4 chronic kidney disease, or unspecified chronic kidney disease: Secondary | ICD-10-CM | POA: Insufficient documentation

## 2016-08-29 DIAGNOSIS — J441 Chronic obstructive pulmonary disease with (acute) exacerbation: Secondary | ICD-10-CM | POA: Diagnosis not present

## 2016-08-29 DIAGNOSIS — Z794 Long term (current) use of insulin: Secondary | ICD-10-CM | POA: Insufficient documentation

## 2016-08-29 DIAGNOSIS — S79912A Unspecified injury of left hip, initial encounter: Secondary | ICD-10-CM | POA: Diagnosis not present

## 2016-08-29 DIAGNOSIS — N182 Chronic kidney disease, stage 2 (mild): Secondary | ICD-10-CM | POA: Insufficient documentation

## 2016-08-29 DIAGNOSIS — Z79899 Other long term (current) drug therapy: Secondary | ICD-10-CM | POA: Diagnosis not present

## 2016-08-29 DIAGNOSIS — S3993XA Unspecified injury of pelvis, initial encounter: Secondary | ICD-10-CM | POA: Diagnosis not present

## 2016-08-29 DIAGNOSIS — M25552 Pain in left hip: Secondary | ICD-10-CM | POA: Insufficient documentation

## 2016-08-29 DIAGNOSIS — W19XXXA Unspecified fall, initial encounter: Secondary | ICD-10-CM

## 2016-08-29 LAB — CBG MONITORING, ED: Glucose-Capillary: 121 mg/dL — ABNORMAL HIGH (ref 65–99)

## 2016-08-29 MED ORDER — FENTANYL CITRATE (PF) 100 MCG/2ML IJ SOLN
25.0000 ug | Freq: Once | INTRAMUSCULAR | Status: AC
  Administered 2016-08-29: 25 ug via INTRAMUSCULAR
  Filled 2016-08-29: qty 2

## 2016-08-29 NOTE — ED Triage Notes (Signed)
Fall last night from fall- coughing and fell to floor- now with pain to back Reports stroke to R side in 2017 and reports that that is a "weak side"  Dr Moshe Cipro is PCP

## 2016-08-29 NOTE — ED Notes (Signed)
E-signature pad not working , pt verbalized understanding of d/c instructions

## 2016-08-29 NOTE — ED Provider Notes (Signed)
[facility] DEPT Provider Note   CSN: [id number] Arrival date & time: 08/29/16  1121  By signing my name below, I, Higinio Plan, attest that this documentation has been prepared under the direction and in the presence of Carmin Muskrat, MD . Electronically Signed: Higinio Plan, Scribe. 08/29/2016. 12:54 PM.  History   Chief Complaint Chief Complaint  Patient presents with  . Fall   The history is provided by the patient. No language interpreter was used.   HPI Comments: [name] is a 76 y.o. male who presents to the Emergency Department complaining of gradually worsening, left hip pain s/p a fall that occurred yesterday evening. Pt reports he was coughing while ambulating to "throw something in the trash can" yesterday when he suddenly fell onto his stomach and left side. He notes he has been able to ambulate independently since his fall. He denies any chest pain or lightheadedness before his fall, headache, neck pain, confusion, difficulty speaking, shortness of breath, or abdominal pain.    Past Medical History:  Diagnosis Date  . Abnormality of gait 05/01/2015  . Arthritis    "left leg" (08/30/2013)  . Arthritis of knee   . CAD (coronary artery disease)   . Chronic back pain   . Chronic bronchitis ([place])    "get it q yr"  . COPD (chronic obstructive pulmonary disease) ([place])   . CVA (cerebral infarction)   . Diabetes mellitus, type 1   . GERD (gastroesophageal reflux disease)   . Gout   . Hyperlipidemia   . Hypertension   . Myocardial infarction 1999  . Obesity   . On home oxygen therapy    "2L; only at night" (08/30/2013)  . Pneumonia    "a few times; last time was ~ 06/2013"  . Psoriasis   . Stroke ([place])   . Tussive syncope 05/01/2015  . Vitamin B12 deficiency 05/01/2015    Patient Active Problem List   Diagnosis Date Noted  . Choking 07/13/2015  . History of CVA (cerebrovascular accident) 06/20/2015  . CKD (chronic kidney disease) stage 2, GFR 60-89 ml/min  06/04/2015  . Hypothyroidism 06/04/2015  . Back pain 06/01/2015  . Facial droop 05/15/2015  . Dysarthria 05/15/2015  . Partial seizure ([place]) 05/12/2015  . Right hemiparesis ([place]) 05/12/2015  . Stroke ([place]) 05/10/2015  . GERD (gastroesophageal reflux disease) 05/10/2015  . CVA (cerebral infarction) 05/10/2015  . Vitamin B12 deficiency 05/01/2015  . Abnormality of gait 05/01/2015  . Tussive syncope 05/01/2015  . Recurrent falls 04/29/2015  . Dysphagia 04/26/2015  . COPD exacerbation ([place]) 04/19/2015  . Anemia 04/19/2015  . COPD with acute exacerbation ([place]) 02/11/2015  . Carotid bruit 12/08/2013  . Angina pectoris ([place]) 08/30/2013  . Type 2 diabetes mellitus with diabetic nephropathy ([place]) 05/22/2013  . COPD (chronic obstructive pulmonary disease) ([place]) 11/22/2012  . Sleep related hypoxia 08/30/2012  . Vitamin D deficiency 12/23/2011  . PSORIASIS 07/18/2010  . Gout 01/13/2010  . Type 2 diabetes mellitus with polyneuropathy ([place]) 11/28/2008  . Hyperlipidemia 10/05/2007  . Obesity (BMI 30.0-34.9) 10/05/2007  . Essential hypertension 10/05/2007  . Coronary atherosclerosis 10/05/2007  . GERD 10/05/2007  . Arthritis of knee, right 10/05/2007    Past Surgical History:  Procedure Laterality Date  . APPENDECTOMY    . COLONOSCOPY  03/17/2012   Procedure: COLONOSCOPY;  Surgeon: Rogene Houston, MD;  Location: [facility] ENDO SUITE;  Service: Endoscopy;  Laterality: N/A;  830  . CORONARY ANGIOPLASTY WITH STENT PLACEMENT  1999- 2009-08/30/2013   "  counting today's, I have 5 stents" (08/30/2013)  . CYSTOSCOPY WITH URETHRAL DILATATION N/A 11/26/2012   Procedure: CYSTOSCOPY WITH URETHRAL DILATATION;  Surgeon: Malka So, MD;  Location: [facility] ORS;  Service: Urology;  Laterality: N/A;  . LEFT HEART CATHETERIZATION WITH CORONARY ANGIOGRAM N/A 08/30/2013   Procedure: LEFT HEART CATHETERIZATION WITH CORONARY ANGIOGRAM;  Surgeon: Clent Demark, MD;  Location: [facility] CATH LAB;  Service: Cardiovascular;   Laterality: N/A;  . PERCUTANEOUS CORONARY STENT INTERVENTION (PCI-S) Right 08/30/2013   Procedure: PERCUTANEOUS CORONARY STENT INTERVENTION (PCI-S);  Surgeon: Clent Demark, MD;  Location: [facility] CATH LAB;  Service: Cardiovascular;  Laterality: Right;    Home Medications    Prior to Admission medications   Medication Sig Start Date End Date Taking? Authorizing Provider  ACCU-CHEK AVIVA PLUS test strip FOUR TIMES A DAY Patient taking differently: TWICE A DAY 06/06/16   Cassandria Anger, MD  acetaminophen (TYLENOL) 500 MG tablet Take 500 mg by mouth every 6 (six) hours as needed for moderate pain (left thigh).    Historical Provider, MD  albuterol (PROVENTIL HFA;VENTOLIN HFA) 108 (90 Base) MCG/ACT inhaler Inhale 2 puffs into the lungs every 4 (four) hours as needed for wheezing or shortness of breath. 06/22/15   Kristen N Ward, DO  allopurinol (ZYLOPRIM) 300 MG tablet TAKE 1 TABLET ONE TIME DAILY 09/18/15   Sanjuana Kava, MD  amLODipine (NORVASC) 10 MG tablet TAKE 1 TABLET EVERY DAY 06/06/16   Fayrene Helper, MD  aspirin 325 MG tablet Take 1 tablet (325 mg total) by mouth daily. 05/12/15   Kathie Dike, MD  clopidogrel (PLAVIX) 75 MG tablet Take 1 tablet (75 mg total) by mouth daily. 07/01/16   Fayrene Helper, MD  ergocalciferol (VITAMIN D2) 50000 units capsule Take 1 capsule (50,000 Units total) by mouth once a week. One capsule once weekly 07/18/16   Fayrene Helper, MD  ezetimibe (ZETIA) 10 MG tablet Take 1 tablet (10 mg total) by mouth daily. 07/18/16   Fayrene Helper, MD  hydroxypropyl cellulose (LACRISERT) 5 MG INST Place 5 mg into the left eye at bedtime. Patient not taking: Reported on 07/26/2016 07/15/16 01/11/17  Fayrene Helper, MD  insulin NPH-regular Human (NOVOLIN 70/30) (70-30) 100 UNIT/ML injection INJECT 40 UNITS SUBCUTANEOUSLY TWICE DAILY 05/05/16   Cassandria Anger, MD  levETIRAcetam (KEPPRA) 500 MG tablet Take 1 tablet by mouth 2 (two) times daily. 07/02/16    Historical Provider, MD  levothyroxine (SYNTHROID, LEVOTHROID) 88 MCG tablet Take 1 tablet (88 mcg total) by mouth every morning. 07/14/16   Cassandria Anger, MD  lisinopril (PRINIVIL,ZESTRIL) 40 MG tablet TAKE 1 TABLET EVERY DAY 06/06/16   Fayrene Helper, MD  meclizine (ANTIVERT) 25 MG tablet Take 1 tablet (25 mg total) by mouth 3 (three) times daily as needed for dizziness. 07/26/16   Jola Schmidt, MD  metFORMIN (GLUCOPHAGE) 500 MG tablet TAKE 1 TABLET TWICE DAILY 08/12/16   Cassandria Anger, MD  metoprolol (LOPRESSOR) 50 MG tablet Take 0.5 tablets (25 mg total) by mouth 2 (two) times daily. 07/01/16   Fayrene Helper, MD  pantoprazole (PROTONIX) 40 MG tablet TAKE 1 TABLET EVERY DAY BEFORE BREAKFAST 07/01/16   Fayrene Helper, MD  pravastatin (PRAVACHOL) 40 MG tablet Take 2 tablets (80 mg total) by mouth daily. 07/01/16   Fayrene Helper, MD  senna (SENOKOT) 8.6 MG tablet Take 1 tablet by mouth daily.    Historical Provider, MD  spironolactone (ALDACTONE) 25 MG tablet  Take 1 tablet (25 mg total) by mouth daily. 07/01/16   Fayrene Helper, MD  tiotropium (SPIRIVA) 18 MCG inhalation capsule Place 18 mcg into inhaler and inhale daily. Reported on 11/22/2015    Historical Provider, MD  triamcinolone cream (KENALOG) 0.1 % Apply 1 application topically 2 (two) times daily. Apply twice daily to rash on right elbow  , under a moist wrap for 4 weeks 09/19/15   Fayrene Helper, MD  vitamin B-12 (CYANOCOBALAMIN) 1000 MCG tablet Take 1,000 mcg by mouth daily.    Historical Provider, MD    Family History Family History  Problem Relation Age of Onset  . Hypertension Mother   . Diabetes Mother   . Hypertension Father   . Diabetes Brother   . Stroke Daughter   . Arthritis      Family History   . Diabetes      family History     Social History Social History  Substance Use Topics  . Smoking status: Former Smoker    Packs/day: 0.50    Years: 33.00    Types: Cigarettes    Quit date:  11/29/2002  . Smokeless tobacco: Never Used     Comment: "stopped smoking in the 1990's  . Alcohol use No     Allergies   Patient has no known allergies.   Review of Systems Review of Systems  Respiratory: Negative for shortness of breath.   Cardiovascular: Negative for chest pain.  Gastrointestinal: Negative for abdominal pain.  Musculoskeletal: Positive for arthralgias. Negative for neck pain.  Allergic/Immunologic: Negative for immunocompromised state.  Neurological: Negative for speech difficulty, light-headedness and headaches.  Psychiatric/Behavioral: Negative for confusion.  All other systems reviewed and are negative.  Physical Exam Updated Vital Signs BP (!) 156/73 (BP Location: Left Arm)   Pulse 76   Temp 98.2 F (36.8 C) (Temporal)   Resp 18   Ht 5\' 10"  (1.778 m)   Wt 245 lb (111.1 kg)   SpO2 92%   BMI 35.15 kg/m   Physical Exam  Constitutional: He is oriented to person, place, and time. He appears well-developed. No distress.  HENT:  Head: Normocephalic and atraumatic.  Eyes: Conjunctivae and EOM are normal.  Cardiovascular: Normal rate and regular rhythm.   Pulmonary/Chest: Effort normal. No stridor. No respiratory distress.  Abdominal: He exhibits no distension.  Musculoskeletal: He exhibits no edema.       Left shoulder: Normal.       Right hip: Normal.       Left hip: He exhibits tenderness and bony tenderness.       Right knee: Normal.       Left knee: Normal.       Right ankle: Normal.       Left ankle: Normal.  Neurological: He is alert and oriented to person, place, and time.  Skin: Skin is warm and dry.  Psychiatric: He has a normal mood and affect.  Nursing note and vitals reviewed.   ED Treatments / Results  DIAGNOSTIC STUDIES:  Oxygen Saturation is 92% on RA, low by my interpretation.    COORDINATION OF CARE:  12:54 PM Discussed treatment plan with pt at bedside and pt agreed to plan.  Labs (all labs ordered are listed, but  only abnormal results are displayed) Labs Reviewed  CBG MONITORING, ED - Abnormal; Notable for the following:       Result Value   Glucose-Capillary 121 (*)    All other components within normal limits  Radiology Ct Pelvis Wo Contrast  Result Date: 08/29/2016 CLINICAL DATA:  Status post fall, left hip pain EXAM: CT PELVIS WITHOUT CONTRAST TECHNIQUE: Multidetector CT imaging of the pelvis was performed following the standard protocol without intravenous contrast. COMPARISON:  None. FINDINGS: Bones/Joint/Cartilage No fracture or dislocation. Normal alignment. No joint effusion. No aggressive lytic or sclerotic osseous lesion. Mild degenerative changes of the pubic symphysis. Abnormal articulation of the left L5 transverse process with the sacrum with ankylosis across the synchondrosis. Severe degenerative disc disease with disc height loss at L4-5 and L5-S1. Bilateral facet arthropathy and foraminal stenosis at L5-S1. Ligaments Ligaments are suboptimally evaluated by CT. Muscles and Tendons Muscles are normal.  No muscle atrophy. Soft tissue No fluid collection or hematoma. No soft tissue mass. Peripheral vascular atherosclerotic disease. Abdominal aortic atherosclerosis. No pelvic free fluid. No pelvic lymphadenopathy. Prostate is unremarkable. Visualized bowel demonstrates no dilatation or bowel wall thickening. IMPRESSION: 1.  No acute osseous injury of the pelvis. 2. Lumbar spine spondylosis as described above. Electronically Signed   By: Kathreen Devoid   On: 08/29/2016 14:59   Dg Hip Unilat W Or Wo Pelvis 2-3 Views Left  Result Date: 08/29/2016 CLINICAL DATA:  Left hip pain, fall last night during coughing EXAM: DG HIP (WITH OR WITHOUT PELVIS) 2-3V LEFT COMPARISON:  None. FINDINGS: Three views of the left hip submitted. No acute fracture or subluxation. Bilateral hip joints are symmetrical in appearance. Mild atherosclerotic calcifications of femoral arteries. IMPRESSION: No acute fracture or  subluxation. Electronically Signed   By: Lahoma Crocker M.D.   On: 08/29/2016 12:10   Procedures Procedures (including critical care time)  Medications Ordered in ED Medications  fentaNYL (SUBLIMAZE) injection 25 mcg (25 mcg Intramuscular Given 08/29/16 1321)   On repeat exam the patient is awake, sitting upright, states that he feels better. I discussed all findings with him and his wife. With reassuring x-ray, CT scan, patient will follow-up as an outpatient. Absent pain in other areas, low suspicion for occult fractures, no evidence for neurologic dysfunction, and given the passage of one day, with persistent capacity to ambulate, patient is appropriate for primary care follow-up, consideration of physical therapy as needed. I discussed appropriate analgesic regimen with him and his wife.   Initial Impression / Assessment and Plan / ED Course  I have reviewed the triage vital signs and the nursing notes.  Pertinent labs & imaging results that were available during my care of the patient were reviewed by me and considered in my medical decision making (see chart for details).     I personally performed the services described in this documentation, which was scribed in my presence. The recorded information has been reviewed and is accurate.      Final Clinical Impressions(s) / ED Diagnoses  Fall, initial encounter Hip pain left   Carmin Muskrat, MD 08/29/16 1521

## 2016-08-29 NOTE — Discharge Instructions (Signed)
As discussed, it is normal to feel worse in the days immediately following a fall regardless of medication use. ° °However, please take all medication as directed, use ice packs liberally.  If you develop any new, or concerning changes in your condition, please return here for further evaluation and management.   ° °Otherwise, please return followup with your physician ° ° ° °

## 2016-08-29 NOTE — ED Notes (Signed)
Denies hitting head or LOC. Had been walking since fall

## 2016-09-02 ENCOUNTER — Other Ambulatory Visit: Payer: Self-pay

## 2016-09-02 NOTE — Patient Outreach (Signed)
[facility]) Care Management  09/02/2016  [name] 1940-10-24 [id number]   Telephone call to patient for ED  Visit follow up. Patient reports he went to ER for a fall.  Patient reports when he turned around to throw something in the trash he fell.  Discussed with patient that sometimes the blood pressure can fluctuate with sudden movement.  Discussed slow movement when moving.  He verbalized understanding and states that he is using his walker again for ambulation.  Discussed with patient fall precautions.    Plan: RN Health Coach will contact patient in the month of June and patient agrees to next outreach.    Jone Baseman, RN, MSN [place] [phone]

## 2016-09-12 DIAGNOSIS — G4733 Obstructive sleep apnea (adult) (pediatric): Secondary | ICD-10-CM | POA: Diagnosis not present

## 2016-09-12 DIAGNOSIS — Z8673 Personal history of transient ischemic attack (TIA), and cerebral infarction without residual deficits: Secondary | ICD-10-CM | POA: Diagnosis not present

## 2016-09-23 ENCOUNTER — Other Ambulatory Visit: Payer: Self-pay | Admitting: "Endocrinology

## 2016-09-30 DIAGNOSIS — H2513 Age-related nuclear cataract, bilateral: Secondary | ICD-10-CM | POA: Diagnosis not present

## 2016-09-30 DIAGNOSIS — Z794 Long term (current) use of insulin: Secondary | ICD-10-CM | POA: Diagnosis not present

## 2016-09-30 DIAGNOSIS — E119 Type 2 diabetes mellitus without complications: Secondary | ICD-10-CM | POA: Diagnosis not present

## 2016-09-30 DIAGNOSIS — H25013 Cortical age-related cataract, bilateral: Secondary | ICD-10-CM | POA: Diagnosis not present

## 2016-09-30 DIAGNOSIS — H5213 Myopia, bilateral: Secondary | ICD-10-CM | POA: Diagnosis not present

## 2016-09-30 DIAGNOSIS — H524 Presbyopia: Secondary | ICD-10-CM | POA: Diagnosis not present

## 2016-09-30 LAB — HM DIABETES EYE EXAM

## 2016-10-02 ENCOUNTER — Inpatient Hospital Stay (HOSPITAL_COMMUNITY): Admission: RE | Admit: 2016-10-02 | Payer: Medicare HMO | Source: Ambulatory Visit

## 2016-10-03 ENCOUNTER — Encounter (HOSPITAL_COMMUNITY)
Admission: RE | Admit: 2016-10-03 | Discharge: 2016-10-03 | Disposition: A | Discharge status: Home / Self Care | Payer: Medicare HMO | Source: Ambulatory Visit | Attending: Ophthalmology | Admitting: Ophthalmology

## 2016-10-03 ENCOUNTER — Encounter (HOSPITAL_COMMUNITY): Payer: Self-pay

## 2016-10-07 ENCOUNTER — Ambulatory Visit (HOSPITAL_COMMUNITY)
Admission: RE | Admit: 2016-10-07 | Discharge: 2016-10-07 | Disposition: A | Discharge status: Home / Self Care | Payer: Medicare HMO | Source: Ambulatory Visit | Attending: Ophthalmology | Admitting: Ophthalmology

## 2016-10-07 ENCOUNTER — Encounter (HOSPITAL_COMMUNITY): Payer: Self-pay | Admitting: *Deleted

## 2016-10-07 ENCOUNTER — Ambulatory Visit (HOSPITAL_COMMUNITY): Payer: Medicare HMO | Admitting: Anesthesiology

## 2016-10-07 ENCOUNTER — Encounter (HOSPITAL_COMMUNITY)
Admission: RE | Disposition: A | Discharge status: Home / Self Care | Payer: Self-pay | Source: Ambulatory Visit | Attending: Ophthalmology

## 2016-10-07 DIAGNOSIS — J449 Chronic obstructive pulmonary disease, unspecified: Secondary | ICD-10-CM | POA: Diagnosis not present

## 2016-10-07 DIAGNOSIS — H2512 Age-related nuclear cataract, left eye: Secondary | ICD-10-CM | POA: Diagnosis not present

## 2016-10-07 DIAGNOSIS — Z87891 Personal history of nicotine dependence: Secondary | ICD-10-CM | POA: Insufficient documentation

## 2016-10-07 DIAGNOSIS — I10 Essential (primary) hypertension: Secondary | ICD-10-CM | POA: Insufficient documentation

## 2016-10-07 DIAGNOSIS — Z7951 Long term (current) use of inhaled steroids: Secondary | ICD-10-CM | POA: Insufficient documentation

## 2016-10-07 DIAGNOSIS — K219 Gastro-esophageal reflux disease without esophagitis: Secondary | ICD-10-CM | POA: Insufficient documentation

## 2016-10-07 DIAGNOSIS — Z7902 Long term (current) use of antithrombotics/antiplatelets: Secondary | ICD-10-CM | POA: Insufficient documentation

## 2016-10-07 DIAGNOSIS — H25012 Cortical age-related cataract, left eye: Secondary | ICD-10-CM | POA: Diagnosis not present

## 2016-10-07 DIAGNOSIS — Z79899 Other long term (current) drug therapy: Secondary | ICD-10-CM | POA: Diagnosis not present

## 2016-10-07 DIAGNOSIS — E119 Type 2 diabetes mellitus without complications: Secondary | ICD-10-CM | POA: Diagnosis not present

## 2016-10-07 DIAGNOSIS — G709 Myoneural disorder, unspecified: Secondary | ICD-10-CM | POA: Diagnosis not present

## 2016-10-07 DIAGNOSIS — I252 Old myocardial infarction: Secondary | ICD-10-CM | POA: Insufficient documentation

## 2016-10-07 DIAGNOSIS — Z794 Long term (current) use of insulin: Secondary | ICD-10-CM | POA: Diagnosis not present

## 2016-10-07 DIAGNOSIS — Z9981 Dependence on supplemental oxygen: Secondary | ICD-10-CM | POA: Insufficient documentation

## 2016-10-07 HISTORY — PX: CATARACT EXTRACTION W/PHACO: SHX586

## 2016-10-07 LAB — GLUCOSE, CAPILLARY: GLUCOSE-CAPILLARY: 125 mg/dL — AB (ref 65–99)

## 2016-10-07 LAB — SURGICAL PCR SCREEN
MRSA, PCR: NEGATIVE
STAPHYLOCOCCUS AUREUS: NEGATIVE

## 2016-10-07 SURGERY — PHACOEMULSIFICATION, CATARACT, WITH IOL INSERTION
Anesthesia: Monitor Anesthesia Care | Site: Eye | Laterality: Left

## 2016-10-07 MED ORDER — LIDOCAINE HCL (PF) 1 % IJ SOLN
INTRAMUSCULAR | Status: AC
  Filled 2016-10-07: qty 2

## 2016-10-07 MED ORDER — EPINEPHRINE PF 1 MG/ML IJ SOLN
INTRAOCULAR | Status: DC | PRN
  Administered 2016-10-07: 500 mL

## 2016-10-07 MED ORDER — PHENYLEPHRINE HCL 2.5 % OP SOLN
1.0000 [drp] | OPHTHALMIC | Status: AC
  Administered 2016-10-07 (×3): 1 [drp] via OPHTHALMIC

## 2016-10-07 MED ORDER — BSS IO SOLN
INTRAOCULAR | Status: DC | PRN
  Administered 2016-10-07: 15 mL

## 2016-10-07 MED ORDER — PROVISC 10 MG/ML IO SOLN
INTRAOCULAR | Status: DC | PRN
  Administered 2016-10-07: 0.85 mL via INTRAOCULAR

## 2016-10-07 MED ORDER — FENTANYL CITRATE (PF) 100 MCG/2ML IJ SOLN
25.0000 ug | Freq: Once | INTRAMUSCULAR | Status: AC
  Administered 2016-10-07: 25 ug via INTRAVENOUS
  Filled 2016-10-07: qty 2

## 2016-10-07 MED ORDER — CYCLOPENTOLATE-PHENYLEPHRINE 0.2-1 % OP SOLN
1.0000 [drp] | OPHTHALMIC | Status: AC
  Administered 2016-10-07 (×3): 1 [drp] via OPHTHALMIC

## 2016-10-07 MED ORDER — LIDOCAINE HCL (PF) 1 % IJ SOLN
INTRAMUSCULAR | Status: DC | PRN
  Administered 2016-10-07: .5 mL

## 2016-10-07 MED ORDER — KETOROLAC TROMETHAMINE 0.5 % OP SOLN
1.0000 [drp] | OPHTHALMIC | Status: AC
  Administered 2016-10-07 (×3): 1 [drp] via OPHTHALMIC

## 2016-10-07 MED ORDER — MIDAZOLAM HCL 2 MG/2ML IJ SOLN
1.0000 mg | INTRAMUSCULAR | Status: AC
  Administered 2016-10-07: 2 mg via INTRAVENOUS
  Filled 2016-10-07: qty 2

## 2016-10-07 MED ORDER — LACTATED RINGERS IV SOLN
INTRAVENOUS | Status: DC
  Administered 2016-10-07: 09:00:00 via INTRAVENOUS

## 2016-10-07 MED ORDER — TETRACAINE HCL 0.5 % OP SOLN
1.0000 [drp] | OPHTHALMIC | Status: AC
  Administered 2016-10-07 (×3): 1 [drp] via OPHTHALMIC

## 2016-10-07 SURGICAL SUPPLY — 12 items
CLOTH BEACON ORANGE TIMEOUT ST (SAFETY) ×2 IMPLANT
EYE SHIELD UNIVERSAL CLEAR (GAUZE/BANDAGES/DRESSINGS) ×2 IMPLANT
GLOVE BIO SURGEON STRL SZ 6.5 (GLOVE) ×1 IMPLANT
GLOVE BIO SURGEONS STRL SZ 6.5 (GLOVE) ×1
GLOVE BIOGEL PI IND STRL 7.0 (GLOVE) IMPLANT
GLOVE BIOGEL PI INDICATOR 7.0 (GLOVE) ×2
LENS ALC ACRYL/TECN (Ophthalmic Related) ×3 IMPLANT
PAD ARMBOARD 7.5X6 YLW CONV (MISCELLANEOUS) ×2 IMPLANT
RING MALYGIN (MISCELLANEOUS) ×2 IMPLANT
TAPE SURG TRANSPORE 1 IN (GAUZE/BANDAGES/DRESSINGS) IMPLANT
TAPE SURGICAL TRANSPORE 1 IN (GAUZE/BANDAGES/DRESSINGS) ×2
WATER STERILE IRR 250ML POUR (IV SOLUTION) ×2 IMPLANT

## 2016-10-07 NOTE — Discharge Instructions (Signed)
°  °          [facility] Instructions [place] [address]      1. Avoid closing eyes tightly. One often closes the eye tightly when laughing, talking, sneezing, coughing or if they feel irritated. At these times, you should be careful not to close your eyes tightly.  2. Instill eye drops as instructed. To instill drops in your eye, open it, look up and have someone gently pull the lower lid down and instill a couple of drops inside the lower lid.  3. Do not touch upper lid.  4. Take Advil or Tylenol for pain.  5. You may use either eye for near work, such as reading or sewing and you may watch television.  6. You may have your hair done at the beauty parlor at any time.  7. Wear dark glasses with or without your own glasses if you are in bright light.  8. Call our office at [phone] or [phone] if you have sharp pain in your eye or unusual symptoms.  9.  FOLLOW UP WITH DR. SHAPIRO TODAY IN HIS [place] OFFICE AT 3:00 pm.    I have received a copy of the above instructions and will follow them.     IF YOU ARE IN IMMEDIATE DANGER CALL 911!  It is important for you to keep your follow-up appointment with your physician after discharge, OR, for you /your caregiver to make a follow-up appointment with your physician / medical provider after discharge.  Show these instructions to the next healthcare provider you see.

## 2016-10-07 NOTE — Transfer of Care (Signed)
Immediate Anesthesia Transfer of Care Note  Patient: [name]  Procedure(s) Performed: Procedure(s) with comments: CATARACT EXTRACTION PHACO AND INTRAOCULAR LENS PLACEMENT (IOC) (Left) - CDE: 13.49  Patient Location: Short Stay  Anesthesia Type:MAC  Level of Consciousness: awake and alert   Airway & Oxygen Therapy: Patient Spontanous Breathing  Post-op Assessment: Report given to RN  Post vital signs: Reviewed  Last Vitals:  Vitals:   10/07/16 0935 10/07/16 0940  BP: 130/69 132/70  Pulse:    Resp: 17 17  Temp:      Last Pain:  Vitals:   10/07/16 0847  TempSrc: Oral      Patients Stated Pain Goal: 5 (60/45/40 9811)  Complications: No apparent anesthesia complications

## 2016-10-07 NOTE — Anesthesia Preprocedure Evaluation (Addendum)
Anesthesia Evaluation  Patient identified by MRN, date of birth, ID band Patient awake    Reviewed: Allergy & Precautions, NPO status , Patient's Chart, lab work & pertinent test results  Airway Mallampati: IV  TM Distance: >3 FB Neck ROM: Full    Dental  (+) Edentulous Upper, Partial Lower   Pulmonary sleep apnea and Continuous Positive Airway Pressure Ventilation , COPD,  oxygen dependent, former smoker,    breath sounds clear to auscultation       Cardiovascular hypertension, Pt. on medications and Pt. on home beta blockers (-) angina+ CAD, + Past MI and + Cardiac Stents   Rhythm:Regular Rate:Normal     Neuro/Psych  Neuromuscular disease CVA    GI/Hepatic GERD  Medicated and Controlled,  Endo/Other  diabetesHypothyroidism   Renal/GU      Musculoskeletal   Abdominal   Peds  Hematology  (+) anemia ,   Anesthesia Other Findings   Reproductive/Obstetrics                             Anesthesia Physical Anesthesia Plan  ASA: III  Anesthesia Plan: MAC   Post-op Pain Management:    Induction: Intravenous  Airway Management Planned: Nasal Cannula  Additional Equipment:   Intra-op Plan:   Post-operative Plan:   Informed Consent: I have reviewed the patients History and Physical, chart, labs and discussed the procedure including the risks, benefits and alternatives for the proposed anesthesia with the patient or authorized representative who has indicated his/her understanding and acceptance.     Plan Discussed with:   Anesthesia Plan Comments:         Anesthesia Quick Evaluation

## 2016-10-07 NOTE — Anesthesia Postprocedure Evaluation (Signed)
Anesthesia Post Note  Patient: [name]  Procedure(s) Performed: Procedure(s) (LRB): CATARACT EXTRACTION PHACO AND INTRAOCULAR LENS PLACEMENT (IOC) (Left)  Patient location during evaluation: Short Stay Anesthesia Type: MAC Level of consciousness: awake and alert and oriented Pain management: pain level controlled Vital Signs Assessment: post-procedure vital signs reviewed and stable Respiratory status: spontaneous breathing Cardiovascular status: blood pressure returned to baseline Postop Assessment: no signs of nausea or vomiting Anesthetic complications: no     Last Vitals:  Vitals:   10/07/16 0935 10/07/16 0940  BP: 130/69 132/70  Pulse:    Resp: 17 17  Temp:      Last Pain:  Vitals:   10/07/16 0847  TempSrc: Oral                 [name]

## 2016-10-07 NOTE — Op Note (Signed)
Patient brought to the operating room and prepped and draped in the usual manner.  Lid speculum inserted in left eye.  Stab incision made at the twelve o'clock position.  Intraocular Xylocaine instilled. Provisc instilled in the anterior chamber.   A 2.4 mm. Stab incision was made temporally.  Due to a small pupil, a Malugyn Ring was inserted.  An anterior capsulotomy was done with a bent 25 gauge needle.  The nucleus was hydrodissected.  The Phaco tip was inserted in the anterior chamber and the nucleus was emulsified.  CDE was 13.49.  The cortical material was then removed with the I and A tip.  Posterior capsule was the polished.  The anterior chamber was deepened with Provisc.  A 17.5 Diopter Alcon [id number] IOL was then inserted in the capsular bag.  Provisc was then removed with the I and A tip.  The wound was then hydrated.  Patient sent to the Recovery Room in good condition with follow up in my office.  Preoperative Diagnosis: Cortical and Nuclear Cataract OS Postoperative Diagnosis:  Same Procedure name: Kelman Phacoemulsification OS with IOL

## 2016-10-07 NOTE — H&P (Signed)
The patient was re examined and there is no change in the patients condition since the original H and P. 

## 2016-10-08 ENCOUNTER — Encounter (HOSPITAL_COMMUNITY): Payer: Self-pay | Admitting: Ophthalmology

## 2016-10-10 ENCOUNTER — Other Ambulatory Visit: Payer: Self-pay | Admitting: "Endocrinology

## 2016-10-10 DIAGNOSIS — E1121 Type 2 diabetes mellitus with diabetic nephropathy: Secondary | ICD-10-CM | POA: Diagnosis not present

## 2016-10-10 LAB — COMPREHENSIVE METABOLIC PANEL
ALT: 5 U/L — ABNORMAL LOW (ref 9–46)
AST: 8 U/L — AB (ref 10–35)
Albumin: 3.7 g/dL (ref 3.6–5.1)
Alkaline Phosphatase: 60 U/L (ref 40–115)
BUN: 19 mg/dL (ref 7–25)
CALCIUM: 9 mg/dL (ref 8.6–10.3)
CHLORIDE: 102 mmol/L (ref 98–110)
CO2: 27 mmol/L (ref 20–31)
Creat: 1.1 mg/dL (ref 0.70–1.18)
GLUCOSE: 87 mg/dL (ref 65–99)
POTASSIUM: 4.1 mmol/L (ref 3.5–5.3)
Sodium: 138 mmol/L (ref 135–146)
Total Bilirubin: 0.3 mg/dL (ref 0.2–1.2)
Total Protein: 6.4 g/dL (ref 6.1–8.1)

## 2016-10-11 LAB — HEMOGLOBIN A1C
Hgb A1c MFr Bld: 6.6 % — ABNORMAL HIGH (ref <5.7)
Mean Plasma Glucose: 143 mg/dL

## 2016-10-12 DIAGNOSIS — G4733 Obstructive sleep apnea (adult) (pediatric): Secondary | ICD-10-CM | POA: Diagnosis not present

## 2016-10-12 DIAGNOSIS — Z8673 Personal history of transient ischemic attack (TIA), and cerebral infarction without residual deficits: Secondary | ICD-10-CM | POA: Diagnosis not present

## 2016-10-14 ENCOUNTER — Other Ambulatory Visit: Payer: Self-pay | Admitting: "Endocrinology

## 2016-10-15 ENCOUNTER — Encounter: Payer: Self-pay | Admitting: "Endocrinology

## 2016-10-15 ENCOUNTER — Ambulatory Visit (INDEPENDENT_AMBULATORY_CARE_PROVIDER_SITE_OTHER): Payer: Medicare HMO | Admitting: "Endocrinology

## 2016-10-15 VITALS — BP 143/68 | HR 87 | Ht 70.0 in | Wt 251.0 lb

## 2016-10-15 DIAGNOSIS — E782 Mixed hyperlipidemia: Secondary | ICD-10-CM | POA: Diagnosis not present

## 2016-10-15 DIAGNOSIS — I10 Essential (primary) hypertension: Secondary | ICD-10-CM

## 2016-10-15 DIAGNOSIS — Z794 Long term (current) use of insulin: Secondary | ICD-10-CM

## 2016-10-15 DIAGNOSIS — E1121 Type 2 diabetes mellitus with diabetic nephropathy: Secondary | ICD-10-CM | POA: Diagnosis not present

## 2016-10-15 DIAGNOSIS — E038 Other specified hypothyroidism: Secondary | ICD-10-CM | POA: Diagnosis not present

## 2016-10-15 MED ORDER — INSULIN NPH ISOPHANE & REGULAR (70-30) 100 UNIT/ML ~~LOC~~ SUSP: SUBCUTANEOUS | 2 refills | Status: DC

## 2016-10-15 NOTE — Patient Instructions (Signed)

## 2016-10-15 NOTE — Progress Notes (Signed)
Subjective:    Patient ID: [name], male    DOB: 27-Nov-1940, PCP Fayrene Helper, MD   Past Medical History:  Diagnosis Date  . Abnormality of gait 05/01/2015  . Arthritis    "left leg" (08/30/2013)  . Arthritis of knee   . CAD (coronary artery disease)   . Chronic back pain   . Chronic bronchitis ([place])    "get it q yr"  . COPD (chronic obstructive pulmonary disease) ([place])   . CVA (cerebral infarction)   . Diabetes mellitus, type 1   . GERD (gastroesophageal reflux disease)   . Gout   . Hyperlipidemia   . Hypertension   . Myocardial infarction ([place]) 1999  . Obesity   . On home oxygen therapy    "2L; only at night" (08/30/2013)  . Pneumonia    "a few times; last time was ~ 06/2013"  . Psoriasis   . Sleep apnea    wear CPAP at night  . Stroke ([place]) 05/2015   R sided weakness  . Tussive syncope 05/01/2015  . Vitamin B12 deficiency 05/01/2015   Past Surgical History:  Procedure Laterality Date  . APPENDECTOMY    . CATARACT EXTRACTION W/PHACO Left 10/07/2016   Procedure: CATARACT EXTRACTION PHACO AND INTRAOCULAR LENS PLACEMENT (IOC);  Surgeon: Rutherford Guys, MD;  Location: [facility];  Service: Ophthalmology;  Laterality: Left;  CDE: 13.49  . COLONOSCOPY  03/17/2012   Procedure: COLONOSCOPY;  Surgeon: Rogene Houston, MD;  Location: [facility] END[facility] SUITE;  Service: Endoscopy;  Laterality: N/A;  830  . CORONARY ANGIOPLASTY WITH STENT PLACEMENT  1999- 2009-08/30/2013   "counting today's, I have 5 stents" (08/30/2013)  . CYSTOSCOPY WITH URETHRAL DILATATION N/A 11/26/2012   Procedure: CYSTOSCOPY WITH URETHRAL DILATATION;  Surgeon: Malka So, MD;  Location: [facility];  Service: Urology;  Laterality: N/A;  . LEFT HEART CATHETERIZATION WITH CORONARY ANGIOGRAM N/A 08/30/2013   Procedure: LEFT HEART CATHETERIZATION WITH CORONARY ANGIOGRAM;  Surgeon: Clent Demark, MD;  Location: [facility] LAB;  Service: Cardiovascular;  Laterality: N/A;  . PERCUTANEOUS CORONARY STENT INTERVENTION  (PCI-S) Right 08/30/2013   Procedure: PERCUTANEOUS CORONARY STENT INTERVENTION (PCI-S);  Surgeon: Clent Demark, MD;  Location[facility] LAB;  Service: Cardiovascular;  Laterality: Right;   Social History   Social History  . Marital status: Married    Spouse name: N/A  . Number of children: 2  . Years of education: 11   Occupational History  . janitorial services     Social History Main Topics  . Smoking status: Former Smoker    Packs/day: 0.50    Years: 33.00    Types: Cigarettes    Quit date: 11/29/2002  . Smokeless tobacco: Never Used     Comment: "stopped smoking in the 1990's  . Alcohol use No  . Drug use: No  . Sexual activity: Not Currently   Other Topics Concern  . None   Social History Narrative   Patient drinks about 3 cups of soda daily.    Patient is left handed.    Outpatient Encounter Prescriptions as of 10/15/2016  Medication Sig  . ACCU-CHEK AVIVA PLUS test strip FOUR TIMES A DAY (Patient taking differently: TWICE A DAY)  . acetaminophen (TYLENOL) 500 MG tablet Take 500 mg by mouth every 6 (six) hours as needed for moderate pain (left thigh).  Marland Kitchen albuterol (PROVENTIL HFA;VENTOLIN HFA) 108 (90 Base) MCG/ACT inhaler Inhale 2 puffs into the lungs every 4 (four) hours as  needed for wheezing or shortness of breath.  . allopurinol (ZYLOPRIM) 300 MG tablet TAKE 1 TABLET ONE TIME DAILY  . amLODipine (NORVASC) 10 MG tablet TAKE 1 TABLET EVERY DAY  . aspirin 325 MG tablet Take 1 tablet (325 mg total) by mouth daily.  . clopidogrel (PLAVIX) 75 MG tablet Take 1 tablet (75 mg total) by mouth daily.  . ergocalciferol (VITAMIN D2) 50000 units capsule Take 1 capsule (50,000 Units total) by mouth once a week. One capsule once weekly (Patient taking differently: Take 50,000 Units by mouth every Monday. )  . ezetimibe (ZETIA) 10 MG tablet Take 1 tablet (10 mg total) by mouth daily.  . hydroxypropyl cellulose (LACRISERT) 5 MG INST Place 5 mg into the left eye at bedtime. (Patient  not taking: Reported on 07/26/2016)  . insulin NPH-regular Human (NOVOLIN 70/30 RELION) (70-30) 100 UNIT/ML injection INJECT 40 UNITS INTO SKIN WITH BREAKFAST AND SUPPER WHEN GLUCOSE ID ABOVE 90  . levETIRAcetam (KEPPRA) 500 MG tablet Take 500 mg by mouth 2 (two) times daily.   Marland Kitchen levothyroxine (SYNTHROID, LEVOTHROID) 88 MCG tablet Take 1 tablet (88 mcg total) by mouth every morning.  Marland Kitchen lisinopril (PRINIVIL,ZESTRIL) 40 MG tablet TAKE 1 TABLET EVERY DAY  . meclizine (ANTIVERT) 25 MG tablet Take 1 tablet (25 mg total) by mouth 3 (three) times daily as needed for dizziness.  . metoprolol (LOPRESSOR) 50 MG tablet Take 0.5 tablets (25 mg total) by mouth 2 (two) times daily.  . pantoprazole (PROTONIX) 40 MG tablet TAKE 1 TABLET EVERY DAY BEFORE BREAKFAST (Patient taking differently: Take 40 mg by mouth daily before breakfast. )  . pravastatin (PRAVACHOL) 40 MG tablet Take 2 tablets (80 mg total) by mouth daily. (Patient taking differently: Take 40 mg by mouth 2 (two) times daily. )  . senna (SENOKOT) 8.6 MG tablet Take 1-2 tablets by mouth daily as needed for constipation.   Marland Kitchen spironolactone (ALDACTONE) 25 MG tablet Take 1 tablet (25 mg total) by mouth daily.  . Tiotropium Bromide Monohydrate (SPIRIVA RESPIMAT) 2.5 MCG/ACT AERS Inhale 2.5 mg into the lungs daily as needed (for respiratory issues.).  Marland Kitchen triamcinolone cream (KENALOG) 0.1 % Apply 1 application topically 2 (two) times daily. Apply twice daily to rash on right elbow  , under a moist wrap for 4 weeks (Patient taking differently: Apply 1 application topically 2 (two) times daily as needed (for affected area of skin on elbow). Apply twice daily to rash on right elbow  , under a moist wrap for 4 weeks)  . vitamin B-12 (CYANOCOBALAMIN) 1000 MCG tablet Take 1,000 mcg by mouth daily.  . [DISCONTINUED] metFORMIN (GLUCOPHAGE) 500 MG tablet TAKE 1 TABLET TWICE DAILY  . [DISCONTINUED] NOVOLIN 70/30 RELION (70-30) 100 UNIT/ML injection INJECT 40 UNITS  SUBCUTANEOUSLY TWICE DAILY   No facility-administered encounter medications on file as of 10/15/2016.    ALLERGIES: No Known Allergies VACCINATION STATUS: Immunization History  Administered Date(s) Administered  . Influenza Whole 03/18/2005  . Pneumococcal Conjugate-13 12/08/2013  . Pneumococcal Polysaccharide-23 06/09/2012  . Td 12/04/2003    Diabetes  He presents for his follow-up diabetic visit. He has type 2 diabetes mellitus. Onset time: He was diagnosed at approximate age of 76 years. His disease course has been stable. There are no hypoglycemic associated symptoms. Pertinent negatives for hypoglycemia include no confusion, headaches, pallor or seizures. Pertinent negatives for diabetes include no chest pain, no fatigue, no polydipsia, no polyphagia, no polyuria and no weakness. Symptoms are stable. Diabetic complications include a CVA,  heart disease, nephropathy and peripheral neuropathy. Risk factors for coronary artery disease include dyslipidemia, diabetes mellitus, male sex, obesity, tobacco exposure and sedentary lifestyle. Current diabetic treatment includes insulin injections. He is compliant with treatment most of the time. His weight is increasing steadily. He is following a generally unhealthy diet. When asked about meal planning, he reported none. Exercise: Currently undergoing physical therapy after recent cerebrovascular accident. His breakfast blood glucose range is generally 140-180 mg/dl. His overall blood glucose range is 140-180 mg/dl. An ACE inhibitor/angiotensin II receptor blocker is being taken.  Hyperlipidemia  This is a chronic problem. The current episode started more than 1 year ago. The problem is controlled. Exacerbating diseases include diabetes, hypothyroidism and obesity. Pertinent negatives include no chest pain, myalgias or shortness of breath. Current antihyperlipidemic treatment includes statins. Risk factors for coronary artery disease include diabetes  mellitus, dyslipidemia, hypertension, male sex and a sedentary lifestyle.  Hypertension  This is a chronic problem. The current episode started more than 1 year ago. The problem is controlled. Pertinent negatives include no chest pain, headaches, neck pain, palpitations or shortness of breath. Past treatments include ACE inhibitors. Hypertensive end-organ damage includes CAD/MI and CVA. Identifiable causes of hypertension include a thyroid problem.  Thyroid Problem  Presents for follow-up visit. Patient reports no constipation, diarrhea, fatigue or palpitations. The symptoms have been stable. Past treatments include levothyroxine. His past medical history is significant for diabetes and hyperlipidemia.     Review of Systems  Constitutional: Negative for chills, fatigue, fever and unexpected weight change.  HENT: Negative for dental problem, mouth sores and trouble swallowing.   Eyes: Negative for visual disturbance.  Respiratory: Negative for cough, choking, chest tightness, shortness of breath and wheezing.   Cardiovascular: Negative for chest pain, palpitations and leg swelling.  Gastrointestinal: Negative for abdominal distention, abdominal pain, constipation, diarrhea, nausea and vomiting.  Endocrine: Negative for polydipsia, polyphagia and polyuria.  Genitourinary: Negative for dysuria, flank pain, hematuria and urgency.  Musculoskeletal: Positive for gait problem. Negative for back pain, myalgias and neck pain.       He walks with a walker due to recent CVA.  Skin: Negative for pallor, rash and wound.  Neurological: Negative for seizures, syncope, weakness, numbness and headaches.  Psychiatric/Behavioral: Negative.  Negative for confusion and dysphoric mood.    Objective:    BP (!) 143/68   Pulse 87   Ht 5\' 10"  (1.778 m)   Wt 251 lb (113.9 kg)   BMI 36.01 kg/m   Wt Readings from Last 3 Encounters:  10/15/16 251 lb (113.9 kg)  10/07/16 245 lb (111.1 kg)  08/29/16 245 lb (111.1  kg)    Physical Exam  Constitutional: He is oriented to person, place, and time. He appears well-developed and well-nourished. He is cooperative. No distress.  HENT:  Head: Normocephalic and atraumatic.  Eyes: EOM are normal.  Neck: Normal range of motion. Neck supple. No tracheal deviation present. No thyromegaly present.  Cardiovascular: Normal rate, S1 normal, S2 normal and normal heart sounds.  Exam reveals no gallop.   No murmur heard. Pulses:      Dorsalis pedis pulses are 1+ on the right side, and 1+ on the left side.       Posterior tibial pulses are 1+ on the right side, and 1+ on the left side.  Pulmonary/Chest: Breath sounds normal. No respiratory distress. He has no wheezes.  Abdominal: Soft. Bowel sounds are normal. He exhibits no distension. There is no tenderness. There is no  guarding and no CVA tenderness.  Musculoskeletal: He exhibits edema.       Right shoulder: He exhibits no swelling and no deformity.   Mild bilateral pedal and lower leg edema.  Neurological: He is alert and oriented to person, place, and time. He has normal strength and normal reflexes. No cranial nerve deficit or sensory deficit. Gait normal.  Skin: Skin is warm and dry. No rash noted. No cyanosis. Nails show no clubbing.  Psychiatric: He has a normal mood and affect. His speech is normal and behavior is normal. Judgment and thought content normal. Cognition and memory are normal.    CMP     Component Value Date/Time   NA 138 10/10/2016 0910   K 4.1 10/10/2016 0910   CL 102 10/10/2016 0910   CO2 27 10/10/2016 0910   GLUCOSE 87 10/10/2016 0910   BUN 19 10/10/2016 0910   CREATININE 1.10 10/10/2016 0910   CALCIUM 9.0 10/10/2016 0910   PROT 6.4 10/10/2016 0910   ALBUMIN 3.7 10/10/2016 0910   AST 8 (L) 10/10/2016 0910   ALT 5 (L) 10/10/2016 0910   ALKPHOS 60 10/10/2016 0910   BILITOT 0.3 10/10/2016 0910   GFRNONAA >60 07/26/2016 2047   GFRNONAA 58 (L) 01/15/2016 1021   GFRAA >60 07/26/2016  2047   GFRAA 68 01/15/2016 1021     Diabetic Labs (most recent): Lab Results  Component Value Date   HGBA1C 6.6 (H) 10/10/2016   HGBA1C 6.6 (H) 07/08/2016   HGBA1C 6.0 (H) 01/15/2016     Lipid Panel ( most recent) Lipid Panel     Component Value Date/Time   CHOL 135 07/08/2016 0827   TRIG 241 (H) 07/08/2016 0827   HDL 24 (L) 07/08/2016 0827   CHOLHDL 5.6 (H) 07/08/2016 0827   VLDL 48 (H) 07/08/2016 0827   LDLCALC 63 07/08/2016 0827   LDLDIRECT 50 08/18/2014 0737      Assessment & Plan:   1. Type 2 diabetes mellitus with diabetic nephropathy, unspecified long term insulin use status (HCC) -His diabetes is  complicated by coronary artery disease, CVA, CKD and patient remains at a high risk for more acute and chronic complications of diabetes which include CAD, CVA, CKD, retinopathy, and neuropathy. These are all discussed in detail with the patient.  Patient came with his meter and log of glucose profile, and  recent A1c of 6.6%.   -  Recent labs reviewed.  - I have re-counseled the patient on diet management and weight loss  by adopting a carbohydrate restricted / protein rich  Diet.  - Suggestion is made for patient to avoid simple carbohydrates   from his  diet including Cakes , Desserts, Ice Cream,  Soda (  diet and regular) , Sweet Tea , Candies,  Chips, Cookies, Artificial Sweeteners, Alcohol/beer,   and "Sugar-free" Products .  This will help patient to have stable blood glucose profile and potentially avoid unintended  Weight gain.  - Patient is advised to stick to a routine mealtimes to eat 3 meals  a day and avoid unnecessary snacks ( to snack only to correct hypoglycemia).  - The patient  has been  scheduled with Jearld Fenton, RDN, CDE for individualized DM education.  - I have approached patient with the following individualized plan to manage diabetes and patient agrees.  - I will continue with premixed insulin Novolin 70/30 40 units with breakfast and 40  units with supper for pre-meal glucose of greater than 90 mg/dL. He is advised  to continue strict  monitoring of glucose  AC and HS. - Patient is warned not to take insulin without proper monitoring per orders. -Adjustment parameters are given for hypo and hyperglycemia in writing. -Patient is encouraged to call clinic for blood glucose levels less than 70 or above 300 mg /dl. - I will continue metformin 500 mg by mouth twice a day, therapeutically suitable for patient.   - Patient specific target  for A1c; LDL, HDL, Triglycerides, and  Waist Circumference were discussed in detail.  2) BP/HTN: uncontrolled. Continue current medications including ACEI/ARB. 3) Lipids/HPL:  continue statins. 4)  Weight/Diet: CDE consult in progress, exercise, and carbohydrates information provided.  5) hypothyroidism:  I will continue levothyroxine at 88 g by mouth every morning.   - We discussed about correct intake of levothyroxine, at fasting, with water, separated by at least 30 minutes from breakfast, and separated by more than 4 hours from calcium, iron, multivitamins, acid reflux medications (PPIs). -Patient is made aware of the fact that thyroid hormone replacement is needed for life, dose to be adjusted by periodic monitoring of thyroid function tests.  6) Chronic Care/Health Maintenance:  -Patient is on ACEI/ARB and Statin medications and encouraged to continue to follow up with Ophthalmology, Podiatrist at least yearly or according to recommendations, and advised to  stay away from smoking. I have recommended yearly flu vaccine and pneumonia vaccination at least every 5 years; moderate intensity exercise for up to 150 minutes weekly; and  sleep for at least 7 hours a day. - I advised him to pick up compression stockings to wear on bilateral lower extremities during the times, to help with dependent edema on bilateral lower extremities. - 25 minutes of time was spent on the care of this patient , 50%  of which was applied for counseling on diabetes complications and their preventions.  - I advised patient to maintain close follow up with Fayrene Helper, MD for primary care needs.  Patient is asked to bring meter and  blood glucose logs during his next visit.   Follow up plan: -Return in about 3 months (around 01/15/2017) for follow up with pre-visit labs, meter, and logs.  Glade Lloyd, MD Phone: [phone]  Fax: [phone]   10/15/2016, 12:12 PM

## 2016-10-20 ENCOUNTER — Other Ambulatory Visit: Payer: Self-pay

## 2016-10-20 NOTE — Patient Outreach (Signed)
[facility]) Care Management  10/20/2016  BENIGNO CHECK 03/26/1941 [id number]   Telephone outreach to patient for monthly call.  No answer.  Unable to leave a message.    Plan: RN Health Coach will attempt patient again in the month of June.  Jone Baseman, RN, MSN [place] [phone]

## 2016-10-22 ENCOUNTER — Encounter (HOSPITAL_COMMUNITY): Payer: Self-pay

## 2016-10-22 ENCOUNTER — Encounter (HOSPITAL_COMMUNITY)
Admission: RE | Admit: 2016-10-22 | Discharge: 2016-10-22 | Disposition: A | Discharge status: Home / Self Care | Payer: Medicare HMO | Source: Ambulatory Visit | Attending: Ophthalmology | Admitting: Ophthalmology

## 2016-10-28 ENCOUNTER — Encounter (HOSPITAL_COMMUNITY): Payer: Self-pay

## 2016-10-28 ENCOUNTER — Ambulatory Visit (HOSPITAL_COMMUNITY): Payer: Medicare HMO | Admitting: Anesthesiology

## 2016-10-28 ENCOUNTER — Ambulatory Visit (HOSPITAL_COMMUNITY)
Admission: RE | Admit: 2016-10-28 | Discharge: 2016-10-28 | Disposition: A | Discharge status: Home / Self Care | Payer: Medicare HMO | Source: Ambulatory Visit | Attending: Ophthalmology | Admitting: Ophthalmology

## 2016-10-28 ENCOUNTER — Encounter (HOSPITAL_COMMUNITY)
Admission: RE | Disposition: A | Discharge status: Home / Self Care | Payer: Self-pay | Source: Ambulatory Visit | Attending: Ophthalmology

## 2016-10-28 DIAGNOSIS — E1136 Type 2 diabetes mellitus with diabetic cataract: Secondary | ICD-10-CM | POA: Insufficient documentation

## 2016-10-28 DIAGNOSIS — I252 Old myocardial infarction: Secondary | ICD-10-CM | POA: Insufficient documentation

## 2016-10-28 DIAGNOSIS — K219 Gastro-esophageal reflux disease without esophagitis: Secondary | ICD-10-CM | POA: Insufficient documentation

## 2016-10-28 DIAGNOSIS — I2510 Atherosclerotic heart disease of native coronary artery without angina pectoris: Secondary | ICD-10-CM | POA: Diagnosis not present

## 2016-10-28 DIAGNOSIS — H269 Unspecified cataract: Secondary | ICD-10-CM | POA: Insufficient documentation

## 2016-10-28 DIAGNOSIS — I10 Essential (primary) hypertension: Secondary | ICD-10-CM | POA: Insufficient documentation

## 2016-10-28 DIAGNOSIS — Z87891 Personal history of nicotine dependence: Secondary | ICD-10-CM | POA: Diagnosis not present

## 2016-10-28 DIAGNOSIS — Z7902 Long term (current) use of antithrombotics/antiplatelets: Secondary | ICD-10-CM | POA: Diagnosis not present

## 2016-10-28 DIAGNOSIS — H2511 Age-related nuclear cataract, right eye: Secondary | ICD-10-CM | POA: Diagnosis not present

## 2016-10-28 DIAGNOSIS — Z7982 Long term (current) use of aspirin: Secondary | ICD-10-CM | POA: Insufficient documentation

## 2016-10-28 DIAGNOSIS — Z79899 Other long term (current) drug therapy: Secondary | ICD-10-CM | POA: Diagnosis not present

## 2016-10-28 DIAGNOSIS — Z9981 Dependence on supplemental oxygen: Secondary | ICD-10-CM | POA: Insufficient documentation

## 2016-10-28 DIAGNOSIS — H2512 Age-related nuclear cataract, left eye: Secondary | ICD-10-CM | POA: Diagnosis not present

## 2016-10-28 DIAGNOSIS — J449 Chronic obstructive pulmonary disease, unspecified: Secondary | ICD-10-CM | POA: Diagnosis not present

## 2016-10-28 DIAGNOSIS — G4730 Sleep apnea, unspecified: Secondary | ICD-10-CM | POA: Insufficient documentation

## 2016-10-28 DIAGNOSIS — Z8673 Personal history of transient ischemic attack (TIA), and cerebral infarction without residual deficits: Secondary | ICD-10-CM | POA: Diagnosis not present

## 2016-10-28 DIAGNOSIS — Z794 Long term (current) use of insulin: Secondary | ICD-10-CM | POA: Insufficient documentation

## 2016-10-28 DIAGNOSIS — H25012 Cortical age-related cataract, left eye: Secondary | ICD-10-CM | POA: Diagnosis not present

## 2016-10-28 HISTORY — PX: CATARACT EXTRACTION W/PHACO: SHX586

## 2016-10-28 LAB — GLUCOSE, CAPILLARY: GLUCOSE-CAPILLARY: 133 mg/dL — AB (ref 65–99)

## 2016-10-28 SURGERY — PHACOEMULSIFICATION, CATARACT, WITH IOL INSERTION
Anesthesia: Monitor Anesthesia Care | Laterality: Right

## 2016-10-28 MED ORDER — BSS IO SOLN
INTRAOCULAR | Status: DC | PRN
  Administered 2016-10-28: 500 mL

## 2016-10-28 MED ORDER — TETRACAINE HCL 0.5 % OP SOLN
1.0000 [drp] | OPHTHALMIC | Status: AC
  Administered 2016-10-28 (×3): 1 [drp] via OPHTHALMIC

## 2016-10-28 MED ORDER — KETOROLAC TROMETHAMINE 0.5 % OP SOLN
1.0000 [drp] | OPHTHALMIC | Status: AC
  Administered 2016-10-28 (×3): 1 [drp] via OPHTHALMIC

## 2016-10-28 MED ORDER — MIDAZOLAM HCL 2 MG/2ML IJ SOLN
1.0000 mg | INTRAMUSCULAR | Status: AC
  Administered 2016-10-28: 2 mg via INTRAVENOUS
  Filled 2016-10-28: qty 2

## 2016-10-28 MED ORDER — BSS IO SOLN
INTRAOCULAR | Status: DC | PRN
  Administered 2016-10-28: 15 mL

## 2016-10-28 MED ORDER — PHENYLEPHRINE HCL 2.5 % OP SOLN
1.0000 [drp] | OPHTHALMIC | Status: AC
  Administered 2016-10-28 (×3): 1 [drp] via OPHTHALMIC

## 2016-10-28 MED ORDER — SODIUM CHLORIDE 0.9% FLUSH
INTRAVENOUS | Status: AC
  Filled 2016-10-28: qty 10

## 2016-10-28 MED ORDER — PROVISC 10 MG/ML IO SOLN
INTRAOCULAR | Status: DC | PRN
  Administered 2016-10-28: 0.85 mL via INTRAOCULAR

## 2016-10-28 MED ORDER — FENTANYL CITRATE (PF) 100 MCG/2ML IJ SOLN
25.0000 ug | Freq: Once | INTRAMUSCULAR | Status: AC
  Administered 2016-10-28: 25 ug via INTRAVENOUS
  Filled 2016-10-28: qty 2

## 2016-10-28 MED ORDER — LACTATED RINGERS IV SOLN
INTRAVENOUS | Status: DC
  Administered 2016-10-28: 07:00:00 via INTRAVENOUS

## 2016-10-28 MED ORDER — CYCLOPENTOLATE-PHENYLEPHRINE 0.2-1 % OP SOLN
1.0000 [drp] | OPHTHALMIC | Status: AC
  Administered 2016-10-28 (×3): 1 [drp] via OPHTHALMIC

## 2016-10-28 MED ORDER — TETRACAINE 0.5 % OP SOLN OPTIME - NO CHARGE
OPHTHALMIC | Status: DC | PRN
  Administered 2016-10-28: 2 [drp] via OPHTHALMIC

## 2016-10-28 SURGICAL SUPPLY — 9 items
CLOTH BEACON ORANGE TIMEOUT ST (SAFETY) ×2 IMPLANT
EYE SHIELD UNIVERSAL CLEAR (GAUZE/BANDAGES/DRESSINGS) ×2 IMPLANT
GLOVE BIOGEL PI IND STRL 7.0 (GLOVE) IMPLANT
GLOVE BIOGEL PI INDICATOR 7.0 (GLOVE) ×4
LENS ALC ACRYL/TECN (Ophthalmic Related) ×3 IMPLANT
PAD ARMBOARD 7.5X6 YLW CONV (MISCELLANEOUS) ×2 IMPLANT
TAPE SURG TRANSPORE 1 IN (GAUZE/BANDAGES/DRESSINGS) IMPLANT
TAPE SURGICAL TRANSPORE 1 IN (GAUZE/BANDAGES/DRESSINGS) ×2
WATER STERILE IRR 250ML POUR (IV SOLUTION) ×2 IMPLANT

## 2016-10-28 NOTE — Anesthesia Procedure Notes (Signed)
Procedure Name: MAC Date/Time: 10/28/2016 7:21 AM Performed by: Vista Deck Pre-anesthesia Checklist: Patient identified, Emergency Drugs available, Suction available, Timeout performed and Patient being monitored Patient Re-evaluated:Patient Re-evaluated prior to inductionOxygen Delivery Method: Nasal Cannula

## 2016-10-28 NOTE — Discharge Instructions (Signed)
°  °          [facility] Instructions °[address]- [place] [address]-[place] °    ° °1. Avoid closing eyes tightly. One often closes the eye tightly when laughing, talking, sneezing, coughing or if they feel irritated. At these times, you should be careful not to close your eyes tightly. ° °2. Instill eye drops as instructed. To instill drops in your eye, open it, look up and have someone gently pull the lower lid down and instill a couple of drops inside the lower lid. ° °3. Do not touch upper lid. ° °4. Take Advil or Tylenol for pain. ° °5. You may use either eye for near work, such as reading or sewing and you may watch television. ° °6. You may have your hair done at the beauty parlor at any time. ° °7. Wear dark glasses with or without your own glasses if you are in bright light. ° °8. Call our office at [phone] or [phone] if you have sharp pain in your eye or unusual symptoms. ° °9.  FOLLOW UP WITH DR. SHAPIRO TODAY IN HIS [place] OFFICE AT 2:45pm. ° °  °I have received a copy of the above instructions and will follow them.  ° ° ° °IF YOU ARE IN IMMEDIATE DANGER CALL 911! ° °It is important for you to keep your follow-up appointment with your physician after discharge, OR, for you /your caregiver to make a follow-up appointment with your physician / medical provider after discharge. ° °Show these instructions to the next healthcare provider you see. °PATIENT INSTRUCTIONS °POST-ANESTHESIA ° °IMMEDIATELY FOLLOWING SURGERY:  Do not drive or operate machinery for the first twenty four hours after surgery.  Do not make any important decisions for twenty four hours after surgery or while taking narcotic pain medications or sedatives.  If you develop intractable nausea and vomiting or a severe headache please notify your doctor immediately. ° °FOLLOW-UP:  Please make an appointment with your surgeon as instructed. You do not need to follow up with anesthesia unless  specifically instructed to do so. ° °WOUND CARE INSTRUCTIONS (if applicable):  Keep a dry clean dressing on the anesthesia/puncture wound site if there is drainage.  Once the wound has quit draining you may leave it open to air.  Generally you should leave the bandage intact for twenty four hours unless there is drainage.  If the epidural site drains for more than 36-48 hours please call the anesthesia department. ° °QUESTIONS?:  Please feel free to call your physician or the hospital operator if you have any questions, and they will be happy to assist you.    ° ° ° °

## 2016-10-28 NOTE — Anesthesia Preprocedure Evaluation (Signed)
Anesthesia Evaluation  Patient identified by MRN, date of birth, ID band Patient awake    Reviewed: Allergy & Precautions, NPO status , Patient's Chart, lab work & pertinent test results  Airway Mallampati: IV  TM Distance: >3 FB Neck ROM: Full    Dental  (+) Edentulous Upper, Partial Lower   Pulmonary sleep apnea and Continuous Positive Airway Pressure Ventilation , COPD,  oxygen dependent, former smoker,    breath sounds clear to auscultation       Cardiovascular hypertension, Pt. on medications and Pt. on home beta blockers (-) angina+ CAD, + Past MI and + Cardiac Stents   Rhythm:Regular Rate:Normal     Neuro/Psych  Neuromuscular disease CVA    GI/Hepatic GERD  Medicated and Controlled,  Endo/Other  diabetesHypothyroidism   Renal/GU Renal disease     Musculoskeletal   Abdominal   Peds  Hematology  (+) anemia ,   Anesthesia Other Findings   Reproductive/Obstetrics                             Anesthesia Physical Anesthesia Plan  ASA: III  Anesthesia Plan: MAC   Post-op Pain Management:    Induction: Intravenous  PONV Risk Score and Plan:   Airway Management Planned: Nasal Cannula  Additional Equipment:   Intra-op Plan:   Post-operative Plan:   Informed Consent: I have reviewed the patients History and Physical, chart, labs and discussed the procedure including the risks, benefits and alternatives for the proposed anesthesia with the patient or authorized representative who has indicated his/her understanding and acceptance.     Plan Discussed with:   Anesthesia Plan Comments:         Anesthesia Quick Evaluation

## 2016-10-28 NOTE — Transfer of Care (Signed)
Immediate Anesthesia Transfer of Care Note  Patient: [name]  Procedure(s) Performed: Procedure(s) (LRB): CATARACT EXTRACTION PHACO AND INTRAOCULAR LENS PLACEMENT (IOC) (Right)  Patient Location: Shortstay  Anesthesia Type: MAC  Level of Consciousness: awake  Airway & Oxygen Therapy: Patient Spontanous Breathing   Post-op Assessment: Report given to PACU RN, Post -op Vital signs reviewed and stable and Patient moving all extremities  Post vital signs: Reviewed and stable  Complications: No apparent anesthesia complications

## 2016-10-28 NOTE — Op Note (Signed)
Patient brought to the operating room and prepped and draped in the usual manner.  Lid speculum inserted in right eye.  Stab incision made at the twelve o'clock position.  Provisc instilled in the anterior chamber.   A 2.4 mm. Stab incision was made temporally.  An anterior capsulotomy was done with a bent 25 gauge needle.  The nucleus was hydrodissected.  The Phaco tip was inserted in the anterior chamber and the nucleus was emulsified.  CDE was 16.71.  The cortical material was then removed with the I and A tip.  Posterior capsule was the polished.  The anterior chamber was deepened with Provisc.  A 17.5 Diopter Alcon [id number] IOL was then inserted in the capsular bag.  Provisc was then removed with the I and A tip.  The wound was then hydrated.  Patient sent to the Recovery Room in good condition with follow up in my office.  Preoperative Diagnosis:  Cortical and Nuclear Cataract OD Postoperative Diagnosis:  Same Procedure name: Kelman Phacoemulsification OD with IOL

## 2016-10-28 NOTE — Anesthesia Postprocedure Evaluation (Signed)
Anesthesia Post Note  Patient: [name]  Procedure(s) Performed: Procedure(s) (LRB): CATARACT EXTRACTION PHACO AND INTRAOCULAR LENS PLACEMENT (IOC) (Right)  Patient location during evaluation: Short Stay Anesthesia Type: MAC Level of consciousness: awake and alert Pain management: satisfactory to patient Vital Signs Assessment: post-procedure vital signs reviewed and stable Respiratory status: spontaneous breathing Cardiovascular status: stable Postop Assessment: no signs of nausea or vomiting and adequate PO intake Anesthetic complications: no     Last Vitals:  Vitals:   10/28/16 0712 10/28/16 0739  BP: (!) 141/72 (!) 147/73  Pulse: 72 74  Resp: 20 20  Temp:  36.7 C    Last Pain:  Vitals:   10/28/16 0739  TempSrc: Oral                 Alejandra Hunt

## 2016-10-28 NOTE — H&P (Signed)
The patient was re examined and there is no change in the patients condition since the original H and P. 

## 2016-10-29 ENCOUNTER — Encounter (HOSPITAL_COMMUNITY): Payer: Self-pay | Admitting: Ophthalmology

## 2016-11-03 ENCOUNTER — Other Ambulatory Visit: Payer: Self-pay

## 2016-11-04 ENCOUNTER — Other Ambulatory Visit: Payer: Self-pay | Admitting: Family Medicine

## 2016-11-07 ENCOUNTER — Telehealth: Payer: Self-pay | Admitting: "Endocrinology

## 2016-11-07 ENCOUNTER — Other Ambulatory Visit: Payer: Self-pay

## 2016-11-07 NOTE — Patient Outreach (Signed)
[facility]) Care Management  [place]  11/07/2016   [name] Aug 25, 1940 [id number]  Subjective: Telephone call to patient for every other month call.  Patient reports he is doing good.  He had cataract surgery since last conversation. Patient reports his vision is ok but sometimes is blurry. He states he sees his eye doctor for follow up on Tuesday.  Patient reports he has some problems with getting medication but will be calling primary doctor to get things straightened out. Patient unable to review medications as he is having problems seeing due to recent surgery. Will attempt to review on next call.  Patient in green zone of COPD.  Reviewed with patient COPD symptoms and when to notify physician.  He verbalized understanding.   Objective:   Encounter Medications:  Outpatient Encounter Prescriptions as of 11/07/2016  Medication Sig Note  . ACCU-CHEK AVIVA PLUS test strip FOUR TIMES A DAY (Patient taking differently: TWICE A DAY)   . acetaminophen (TYLENOL) 500 MG tablet Take 500 mg by mouth every 6 (six) hours as needed for moderate pain (left thigh).   Marland Kitchen albuterol (PROVENTIL HFA;VENTOLIN HFA) 108 (90 Base) MCG/ACT inhaler Inhale 2 puffs into the lungs every 4 (four) hours as needed for wheezing or shortness of breath.   . allopurinol (ZYLOPRIM) 300 MG tablet TAKE 1 TABLET ONE TIME DAILY   . amLODipine (NORVASC) 10 MG tablet TAKE 1 TABLET EVERY DAY   . aspirin 325 MG tablet Take 1 tablet (325 mg total) by mouth daily.   . clopidogrel (PLAVIX) 75 MG tablet Take 1 tablet (75 mg total) by mouth daily.   . ergocalciferol (VITAMIN D2) 50000 units capsule Take 1 capsule (50,000 Units total) by mouth once a week. One capsule once weekly (Patient taking differently: Take 50,000 Units by mouth every Monday. )   . ezetimibe (ZETIA) 10 MG tablet Take 1 tablet (10 mg total) by mouth daily.   . hydroxypropyl cellulose (LACRISERT) 5 MG INST Place 5 mg into the left eye  at bedtime.   . insulin NPH-regular Human (NOVOLIN 70/30 RELION) (70-30) 100 UNIT/ML injection INJECT 40 UNITS INTO SKIN WITH BREAKFAST AND SUPPER WHEN GLUCOSE ID ABOVE 90   . levETIRAcetam (KEPPRA) 500 MG tablet Take 500 mg by mouth 2 (two) times daily.    Marland Kitchen levothyroxine (SYNTHROID, LEVOTHROID) 88 MCG tablet Take 1 tablet (88 mcg total) by mouth every morning.   Marland Kitchen lisinopril (PRINIVIL,ZESTRIL) 40 MG tablet TAKE 1 TABLET EVERY DAY   . meclizine (ANTIVERT) 25 MG tablet Take 1 tablet (25 mg total) by mouth 3 (three) times daily as needed for dizziness.   . metFORMIN (GLUCOPHAGE) 500 MG tablet TAKE 1 TABLET TWICE DAILY   . metoprolol (LOPRESSOR) 50 MG tablet Take 0.5 tablets (25 mg total) by mouth 2 (two) times daily.   . pantoprazole (PROTONIX) 40 MG tablet TAKE 1 TABLET EVERY DAY BEFORE BREAKFAST (Patient taking differently: Take 40 mg by mouth daily before breakfast. )   . pravastatin (PRAVACHOL) 40 MG tablet Take 2 tablets (80 mg total) by mouth daily. (Patient taking differently: Take 40 mg by mouth 2 (two) times daily. )   . senna (SENOKOT) 8.6 MG tablet Take 1-2 tablets by mouth daily as needed for constipation.    Marland Kitchen spironolactone (ALDACTONE) 25 MG tablet Take 1 tablet (25 mg total) by mouth daily.   . Tiotropium Bromide Monohydrate (SPIRIVA RESPIMAT) 2.5 MCG/ACT AERS Inhale 2.5 mg into the lungs daily as needed (for  respiratory issues.).   Marland Kitchen triamcinolone cream (KENALOG) 0.1 % Apply 1 application topically 2 (two) times daily. Apply twice daily to rash on right elbow  , under a moist wrap for 4 weeks (Patient taking differently: Apply 1 application topically 2 (two) times daily as needed (for affected area of skin on elbow). Apply twice daily to rash on right elbow  , under a moist wrap for 4 weeks) 04/18/2016: As needed  . vitamin B-12 (CYANOCOBALAMIN) 1000 MCG tablet Take 1,000 mcg by mouth daily.    No facility-administered encounter medications on file as of 11/07/2016.     Functional  Status:  In your present state of health, do you have any difficulty performing the following activities: 10/28/2016 10/03/2016  Hearing? N N  Vision? Y N  Difficulty concentrating or making decisions? N N  Walking or climbing stairs? Y Y  Dressing or bathing? Y N  Doing errands, shopping? - Facilities manager and eating ? - -  Using the Toilet? - -  In the past six months, have you accidently leaked urine? - -  Do you have problems with loss of bowel control? - -  Managing your Medications? - -  Managing your Finances? - -  Housekeeping or managing your Housekeeping? - -  Some recent data might be hidden    Fall/Depression Screening: Fall Risk  11/07/2016 10/15/2016 09/02/2016  Falls in the past year? Yes No Yes  Number falls in past yr: - - 1  Injury with Fall? - - No  Risk Factor Category  - - -  Risk for fall due to : - - -  Risk for fall due to (comments): - - -  Follow up - - Falls prevention discussed   PHQ 2/9 Scores 11/07/2016 10/15/2016 08/20/2016 07/14/2016 07/01/2016 06/19/2016 04/18/2016  PHQ - 2 Score 0 0 0 0 0 0 0    Assessment: Patient continues to benefit from health coach outreach for disease management and support.    Plan:  [facility] CM Care Plan Problem Two     Most Recent Value  Care Plan Problem Two  COPD, knowledge deficit  Role Documenting the Problem Two  [place] for Problem Two  Active  Interventions for Problem Two Long Term Goal   RN Health Coach reviewed with patient COPD action plan zones and when to notify physician.    [facility]N Long Term Goal  Patient will not have COPD exacerbation in the next 60 days  THN Long Term Goal Start Date  11/07/16 [facility] continued]     [place] will contact patient in the month of August and patient agrees to next outreach.  Jone Baseman, RN, MSN [place] [phone]

## 2016-11-07 NOTE — Telephone Encounter (Signed)
[name] IS NEEDING HIS NOVOLIN CALLED IN TO [facility], PLEASE ADVISE?

## 2016-11-10 MED ORDER — INSULIN NPH ISOPHANE & REGULAR (70-30) 100 UNIT/ML ~~LOC~~ SUSP: SUBCUTANEOUS | 0 refills | Status: DC

## 2016-11-10 NOTE — Telephone Encounter (Signed)
Patient left message requesting his medication to be refilled, per patient pharmacy has not received it

## 2016-11-10 NOTE — Telephone Encounter (Signed)
rx sent

## 2016-11-12 DIAGNOSIS — Z8673 Personal history of transient ischemic attack (TIA), and cerebral infarction without residual deficits: Secondary | ICD-10-CM | POA: Diagnosis not present

## 2016-11-12 DIAGNOSIS — G4733 Obstructive sleep apnea (adult) (pediatric): Secondary | ICD-10-CM | POA: Diagnosis not present

## 2016-11-13 ENCOUNTER — Telehealth: Payer: Self-pay | Admitting: Family Medicine

## 2016-11-13 NOTE — Telephone Encounter (Signed)
Patient requesting that all of his prescriptions be called in @ [place]

## 2016-11-14 ENCOUNTER — Other Ambulatory Visit: Payer: Self-pay

## 2016-11-14 DIAGNOSIS — E559 Vitamin D deficiency, unspecified: Secondary | ICD-10-CM

## 2016-11-14 DIAGNOSIS — E785 Hyperlipidemia, unspecified: Secondary | ICD-10-CM

## 2016-11-14 DIAGNOSIS — I2510 Atherosclerotic heart disease of native coronary artery without angina pectoris: Secondary | ICD-10-CM

## 2016-11-14 MED ORDER — PANTOPRAZOLE SODIUM 40 MG PO TBEC: DELAYED_RELEASE_TABLET | ORAL | 1 refills | Status: DC

## 2016-11-14 MED ORDER — SPIRONOLACTONE 25 MG PO TABS: 25.0000 mg | ORAL_TABLET | Freq: Every day | ORAL | 1 refills | Status: DC

## 2016-11-14 MED ORDER — METOPROLOL TARTRATE 50 MG PO TABS: 25.0000 mg | ORAL_TABLET | Freq: Two times a day (BID) | ORAL | 1 refills | Status: DC

## 2016-11-14 MED ORDER — ERGOCALCIFEROL 1.25 MG (50000 UT) PO CAPS: 50000.0000 [IU] | ORAL_CAPSULE | ORAL | 1 refills | Status: DC

## 2016-11-14 MED ORDER — EZETIMIBE 10 MG PO TABS: 10.0000 mg | ORAL_TABLET | Freq: Every day | ORAL | 5 refills | Status: DC

## 2016-11-14 MED ORDER — AMLODIPINE BESYLATE 10 MG PO TABS: 10.0000 mg | ORAL_TABLET | Freq: Every day | ORAL | 1 refills | Status: DC

## 2016-11-14 MED ORDER — PRAVASTATIN SODIUM 40 MG PO TABS: 80.0000 mg | ORAL_TABLET | Freq: Every day | ORAL | 1 refills | Status: DC

## 2016-11-14 MED ORDER — CLOPIDOGREL BISULFATE 75 MG PO TABS: 75.0000 mg | ORAL_TABLET | Freq: Every day | ORAL | 1 refills | Status: DC

## 2016-11-14 MED ORDER — LISINOPRIL 40 MG PO TABS: 40.0000 mg | ORAL_TABLET | Freq: Every day | ORAL | 1 refills | Status: DC

## 2016-11-14 NOTE — Telephone Encounter (Signed)
meds sent

## 2016-11-14 NOTE — Telephone Encounter (Signed)
Seen 2 13 18

## 2016-11-26 DIAGNOSIS — E1142 Type 2 diabetes mellitus with diabetic polyneuropathy: Secondary | ICD-10-CM | POA: Diagnosis not present

## 2016-11-26 DIAGNOSIS — E559 Vitamin D deficiency, unspecified: Secondary | ICD-10-CM | POA: Diagnosis not present

## 2016-11-26 DIAGNOSIS — Z125 Encounter for screening for malignant neoplasm of prostate: Secondary | ICD-10-CM | POA: Diagnosis not present

## 2016-11-26 DIAGNOSIS — E785 Hyperlipidemia, unspecified: Secondary | ICD-10-CM | POA: Diagnosis not present

## 2016-11-26 LAB — COMPLETE METABOLIC PANEL WITH GFR
ALT: 4 U/L — AB (ref 9–46)
AST: 9 U/L — ABNORMAL LOW (ref 10–35)
Albumin: 4 g/dL (ref 3.6–5.1)
Alkaline Phosphatase: 61 U/L (ref 40–115)
BILIRUBIN TOTAL: 0.4 mg/dL (ref 0.2–1.2)
BUN: 18 mg/dL (ref 7–25)
CALCIUM: 9.4 mg/dL (ref 8.6–10.3)
CO2: 26 mmol/L (ref 20–31)
Chloride: 100 mmol/L (ref 98–110)
Creat: 1.17 mg/dL (ref 0.70–1.18)
GFR, EST AFRICAN AMERICAN: 71 mL/min (ref >=60)
GFR, Est Non African American: 61 mL/min (ref >=60)
Glucose, Bld: 92 mg/dL (ref 65–99)
Potassium: 4.2 mmol/L (ref 3.5–5.3)
Sodium: 137 mmol/L (ref 135–146)
TOTAL PROTEIN: 6.8 g/dL (ref 6.1–8.1)

## 2016-11-26 LAB — LIPID PANEL
CHOLESTEROL: 105 mg/dL (ref <200)
HDL: 24 mg/dL — AB (ref >40)
LDL Cholesterol: 49 mg/dL (ref <100)
TRIGLYCERIDES: 160 mg/dL — AB (ref <150)
Total CHOL/HDL Ratio: 4.4 Ratio (ref <5.0)
VLDL: 32 mg/dL — ABNORMAL HIGH (ref <30)

## 2016-11-27 LAB — PSA: PSA: 0.5 ng/mL (ref <=4.0)

## 2016-11-27 LAB — VITAMIN D 25 HYDROXY (VIT D DEFICIENCY, FRACTURES): Vit D, 25-Hydroxy: 49 ng/mL (ref 30–100)

## 2016-12-01 ENCOUNTER — Encounter: Payer: Self-pay | Admitting: Family Medicine

## 2016-12-01 ENCOUNTER — Ambulatory Visit (INDEPENDENT_AMBULATORY_CARE_PROVIDER_SITE_OTHER): Payer: Medicare HMO | Admitting: Family Medicine

## 2016-12-01 VITALS — BP 130/60 | HR 71 | Resp 16 | Ht 70.0 in | Wt 258.4 lb

## 2016-12-01 DIAGNOSIS — Z0000 Encounter for general adult medical examination without abnormal findings: Secondary | ICD-10-CM

## 2016-12-01 DIAGNOSIS — Z1211 Encounter for screening for malignant neoplasm of colon: Secondary | ICD-10-CM | POA: Diagnosis not present

## 2016-12-01 DIAGNOSIS — E1142 Type 2 diabetes mellitus with diabetic polyneuropathy: Secondary | ICD-10-CM

## 2016-12-01 LAB — HEMOCCULT GUIAC POC 1CARD (OFFICE): Fecal Occult Blood, POC: NEGATIVE

## 2016-12-01 NOTE — Patient Instructions (Addendum)
F/u in 4 months, call if you needme before  Labs are good except cut back on fried and fatty foods  TriGlycerides  are high  You are referred for foot care.  Exam is otherwise good  Thank you  for choosing [facility]. We consider it a privelige to serve you.  Delivering excellent health care in a caring and  compassionate way is our goal.  Partnering with you,  so that together we can achieve this goal is our strategy.

## 2016-12-01 NOTE — Progress Notes (Signed)
[name]     MRN: [id number]      DOB: [birth year]   HPI: Patient is in for annual physical exam. No other health concerns are expressed or addressed at the visit. Recent labs,are reviewed. Immunization is reviewed     PE;  BP 130/60   Pulse 71   Resp 16   Ht 5\' 10"  (1.778 m)   Wt 258 lb 6.4 oz (117.2 kg)   SpO2 (!) 87%   BMI 37.08 kg/m   Pleasant male, alert and oriented x 3, in no cardio-pulmonary distress. Afebrile. HEENT No facial trauma or asymetry. Sinuses non tender. EOMI, External ears normal,  Oropharynx moist, no exudate. Neck: supple, no adenopathy,JVD or thyromegaly.No bruits.  Chest: Clear to ascultation bilaterally.No crackles or wheezes. Non tender to palpation  Breast: No asymetry,no masses. No nipple discharge or inversion. No axillary or supraclavicular adenopathy  Cardiovascular system; Heart sounds normal,  S1 and  S2 ,no S3.  Systolic  murmur, no  thrill. Apical beat not displaced Peripheral pulses normal.  Abdomen: Soft, non tender, no organomegaly or masses. No bruits. Bowel sounds normal. No guarding, tenderness or rebound.  Rectal:  Normal sphincter tone. No hemorrhoids or  masses. guaiac negative stool. Prostate smooth and firm    Musculoskeletal exam: Decreased  ROM of spine, hips , shoulders and knees. No deformity ,swelling or crepitus noted. No muscle wasting or atrophy.   Neurologic: Cranial nerves 2 to 12 intact. Abnormal gait, decreased power.and tone   Skin: Intact, no ulceration or erythema , scaling  rash noted.and onychomycosis on both lower extremities Pigmentation normal throughout  Psych; Normal mood and affect. Judgement and concentration normal   Assessment & Plan:  Annual physical exam Annual exam as documented.  Immunization and cancer screening needs are specifically addressed at this visit.   Type 2 diabetes mellitus with polyneuropathy [facility]) [name] is reminded of the  importance of commitment to daily physical activity for 30 minutes or more, as able and the need to limit carbohydrate intake to 30 to 60 grams per meal to help with blood sugar control.   The need to take medication as prescribed, test blood sugar as directed, and to call between visits if there is a concern that blood sugar is uncontrolled is also discussed.   [name] is reminded of the importance of daily foot exam, annual eye examination, and good blood sugar, blood pressure and cholesterol control. Treated by endo and controlled  Diabetic Labs Latest Ref Rng & Units 11/26/2016 10/10/2016 07/26/2016 07/08/2016 06/09/2016  HbA1c <5.7 % - 6.6(H) - 6.6(H) -  Microalbumin <2.0 mg/dL - - - - -  Micro/Creat Ratio 0.0 - 30.0 mg/g - - - - -  Chol <200 mg/dL 105 - - 135 -  HDL >40 mg/dL 24(L) - - 24(L) -  Calc LDL <100 mg/dL 49 - - 63 -  Triglycerides <150 mg/dL 160(H) - - 241(H) -  Creatinine 0.70 - 1.18 mg/dL 1.17 1.10 1.11 1.12 1.16   BP/Weight 12/01/2016 10/28/2016 10/15/2016 10/07/2016 08/29/2016 07/26/2016 7/82/4235  Systolic BP 361 443 154 008 676 195 093  Diastolic BP 60 73 68 67 64 78 74  Wt. (Lbs) 258.4 251 251 245 245 251 251  BMI 37.08 36.01 36.01 35.15 35.15 36.01 36.01   Foot/eye exam completion dates Latest Ref Rng & Units 12/01/2016 09/30/2016  Eye Exam No Retinopathy - Retinopathy(A)  Foot exam Order - - -  Foot Form Completion - Done -

## 2016-12-01 NOTE — Assessment & Plan Note (Signed)
[name] is reminded of the importance of commitment to daily physical activity for 30 minutes or more, as able and the need to limit carbohydrate intake to 30 to 60 grams per meal to help with blood sugar control.   The need to take medication as prescribed, test blood sugar as directed, and to call between visits if there is a concern that blood sugar is uncontrolled is also discussed.   [name] is reminded of the importance of daily foot exam, annual eye examination, and good blood sugar, blood pressure and cholesterol control. Treated by endo and controlled  Diabetic Labs Latest Ref Rng & Units 11/26/2016 10/10/2016 07/26/2016 07/08/2016 06/09/2016  HbA1c <5.7 % - 6.6(H) - 6.6(H) -  Microalbumin <2.0 mg/dL - - - - -  Micro/Creat Ratio 0.0 - 30.0 mg/g - - - - -  Chol <200 mg/dL 105 - - 135 -  HDL >40 mg/dL 24(L) - - 24(L) -  Calc LDL <100 mg/dL 49 - - 63 -  Triglycerides <150 mg/dL 160(H) - - 241(H) -  Creatinine 0.70 - 1.18 mg/dL 1.17 1.10 1.11 1.12 1.16   BP/Weight 12/01/2016 10/28/2016 10/15/2016 10/07/2016 08/29/2016 07/26/2016 5/72/6203  Systolic BP 559 741 638 453 646 803 212  Diastolic BP 60 73 68 67 64 78 74  Wt. (Lbs) 258.4 251 251 245 245 251 251  BMI 37.08 36.01 36.01 35.15 35.15 36.01 36.01   Foot/eye exam completion dates Latest Ref Rng & Units 12/01/2016 09/30/2016  Eye Exam No Retinopathy - Retinopathy(A)  Foot exam Order - - -  Foot Form Completion - Done -

## 2016-12-01 NOTE — Assessment & Plan Note (Signed)
Annual exam as documented. . Immunization and cancer screening needs are specifically addressed at this visit.  

## 2016-12-12 DIAGNOSIS — G4733 Obstructive sleep apnea (adult) (pediatric): Secondary | ICD-10-CM | POA: Diagnosis not present

## 2016-12-12 DIAGNOSIS — Z8673 Personal history of transient ischemic attack (TIA), and cerebral infarction without residual deficits: Secondary | ICD-10-CM | POA: Diagnosis not present

## 2016-12-17 ENCOUNTER — Other Ambulatory Visit: Payer: Self-pay | Admitting: "Endocrinology

## 2016-12-26 ENCOUNTER — Other Ambulatory Visit: Payer: Self-pay

## 2016-12-26 NOTE — Patient Outreach (Signed)
[facility]) Care Management  [place]  12/26/2016   [name] 1940-10-04 [id number]  Subjective: Telephone call to patient for every other month call.  Patient reports he is doing ok.  He reports he is waiting for glasses since cataract surgery.  Patient reports his sugar was 95 this morning.  Patient denies any problems with his breathing recently.  Discussed with patient COPD signs and symptoms and when to notify physician.  He verbalized understanding.    Objective:   Encounter Medications:  Outpatient Encounter Prescriptions as of 12/26/2016  Medication Sig Note  . ACCU-CHEK AVIVA PLUS test strip FOUR TIMES A DAY (Patient taking differently: TWICE A DAY)   . acetaminophen (TYLENOL) 500 MG tablet Take 500 mg by mouth every 6 (six) hours as needed for moderate pain (left thigh).   Marland Kitchen albuterol (PROVENTIL HFA;VENTOLIN HFA) 108 (90 Base) MCG/ACT inhaler Inhale 2 puffs into the lungs every 4 (four) hours as needed for wheezing or shortness of breath.   . allopurinol (ZYLOPRIM) 300 MG tablet TAKE 1 TABLET ONE TIME DAILY   . amLODipine (NORVASC) 10 MG tablet Take 1 tablet (10 mg total) by mouth daily.   Marland Kitchen aspirin 325 MG tablet Take 1 tablet (325 mg total) by mouth daily.   . clopidogrel (PLAVIX) 75 MG tablet Take 1 tablet (75 mg total) by mouth daily.   . ergocalciferol (VITAMIN D2) 50000 units capsule Take 1 capsule (50,000 Units total) by mouth once a week. One capsule once weekly   . ezetimibe (ZETIA) 10 MG tablet Take 1 tablet (10 mg total) by mouth daily.   . insulin NPH-regular Human (NOVOLIN 70/30 RELION) (70-30) 100 UNIT/ML injection INJECT 40 UNITS INTO SKIN WITH BREAKFAST AND SUPPER WHEN GLUCOSE ID ABOVE 90   . levETIRAcetam (KEPPRA) 500 MG tablet TAKE 1 TABLET (500 MG TOTAL) BY MOUTH 2 (TWO) TIMES DAILY.   Marland Kitchen levothyroxine (SYNTHROID, LEVOTHROID) 88 MCG tablet Take 1 tablet (88 mcg total) by mouth every morning.   Marland Kitchen lisinopril (PRINIVIL,ZESTRIL) 40 MG  tablet Take 1 tablet (40 mg total) by mouth daily.   . meclizine (ANTIVERT) 25 MG tablet Take 1 tablet (25 mg total) by mouth 3 (three) times daily as needed for dizziness.   . metFORMIN (GLUCOPHAGE) 500 MG tablet TAKE 1 TABLET TWICE DAILY   . metoprolol tartrate (LOPRESSOR) 50 MG tablet Take 0.5 tablets (25 mg total) by mouth 2 (two) times daily.   . pantoprazole (PROTONIX) 40 MG tablet TAKE 1 TABLET EVERY DAY BEFORE BREAKFAST   . pravastatin (PRAVACHOL) 40 MG tablet Take 2 tablets (80 mg total) by mouth daily.   Marland Kitchen senna (SENOKOT) 8.6 MG tablet Take 1-2 tablets by mouth daily as needed for constipation.    Marland Kitchen spironolactone (ALDACTONE) 25 MG tablet Take 1 tablet (25 mg total) by mouth daily.   . Tiotropium Bromide Monohydrate (SPIRIVA RESPIMAT) 2.5 MCG/ACT AERS Inhale 2.5 mg into the lungs daily as needed (for respiratory issues.).   Marland Kitchen triamcinolone cream (KENALOG) 0.1 % Apply 1 application topically 2 (two) times daily. Apply twice daily to rash on right elbow  , under a moist wrap for 4 weeks (Patient taking differently: Apply 1 application topically 2 (two) times daily as needed (for affected area of skin on elbow). Apply twice daily to rash on right elbow  , under a moist wrap for 4 weeks) 04/18/2016: As needed  . vitamin B-12 (CYANOCOBALAMIN) 1000 MCG tablet Take 1,000 mcg by mouth daily.   Marland Kitchen  hydroxypropyl cellulose (LACRISERT) 5 MG INST Place 5 mg into the left eye at bedtime. (Patient not taking: Reported on 12/26/2016)    No facility-administered encounter medications on file as of 12/26/2016.     Functional Status:  In your present state of health, do you have any difficulty performing the following activities: 10/28/2016 10/03/2016  Hearing? N N  Vision? Y N  Comment - -  Difficulty concentrating or making decisions? N N  Comment - -  Walking or climbing stairs? Y Y  Dressing or bathing? Y N  Doing errands, shopping? - -  Comment - -  Conservation officer, nature and eating ? - -  Using the  Toilet? - -  In the past six months, have you accidently leaked urine? - -  Do you have problems with loss of bowel control? - -  Managing your Medications? - -  Managing your Finances? - -  Housekeeping or managing your Housekeeping? - -  Comment - -  Some recent data might be hidden    Fall/Depression Screening: Fall Risk  12/26/2016 11/07/2016 10/15/2016  Falls in the past year? Yes Yes No  Comment - - -  Number falls in past yr: - - -  Comment - - -  Injury with Fall? - - -  Comment - - -  Risk Factor Category  - - -  Risk for fall due to : - - -  Risk for fall due to: Comment - - -  Follow up - - -   PHQ 2/9 Scores 12/26/2016 11/07/2016 10/15/2016 08/20/2016 07/14/2016 07/01/2016 06/19/2016  PHQ - 2 Score 0 0 0 0 0 0 0    Assessment: Patient continues to benefit from health coach outreach for disease management and support.    Plan:  [facility] CM Care Plan Problem Two     Most Recent Value  Care Plan Problem Two  COPD, knowledge deficit  Role Documenting the Problem [place] for Problem Two  Active  Interventions for Problem Two Long Term Goal   RN Health Coach discussed with patient COPD action plan zones and when to notify physician.    THN Long Term Goal  Patient will not have COPD exacerbation in the next 60 days  THN Long Term Goal Start Date  12/26/16 [facility] continued]     [place] will contact patient in the month of October and patient agrees to next outreach.  Jone Baseman, RN, MSN [place] [phone]

## 2017-01-09 ENCOUNTER — Other Ambulatory Visit: Payer: Self-pay | Admitting: "Endocrinology

## 2017-01-09 DIAGNOSIS — E038 Other specified hypothyroidism: Secondary | ICD-10-CM | POA: Diagnosis not present

## 2017-01-09 DIAGNOSIS — Z794 Long term (current) use of insulin: Secondary | ICD-10-CM | POA: Diagnosis not present

## 2017-01-09 DIAGNOSIS — E1121 Type 2 diabetes mellitus with diabetic nephropathy: Secondary | ICD-10-CM | POA: Diagnosis not present

## 2017-01-09 LAB — COMPREHENSIVE METABOLIC PANEL
ALBUMIN: 4 g/dL (ref 3.6–5.1)
ALK PHOS: 62 U/L (ref 40–115)
ALT: 6 U/L — AB (ref 9–46)
AST: 9 U/L — AB (ref 10–35)
BILIRUBIN TOTAL: 0.3 mg/dL (ref 0.2–1.2)
BUN: 18 mg/dL (ref 7–25)
CALCIUM: 9.3 mg/dL (ref 8.6–10.3)
CO2: 28 mmol/L (ref 20–32)
Chloride: 101 mmol/L (ref 98–110)
Creat: 1.17 mg/dL (ref 0.70–1.18)
Glucose, Bld: 86 mg/dL (ref 65–99)
POTASSIUM: 4.3 mmol/L (ref 3.5–5.3)
Sodium: 138 mmol/L (ref 135–146)
Total Protein: 6.7 g/dL (ref 6.1–8.1)

## 2017-01-09 LAB — TSH: TSH: 2.39 mIU/L (ref 0.40–4.50)

## 2017-01-09 LAB — T4, FREE: FREE T4: 1.1 ng/dL (ref 0.8–1.8)

## 2017-01-10 LAB — HEMOGLOBIN A1C
HEMOGLOBIN A1C: 5.9 % — AB (ref <5.7)
Mean Plasma Glucose: 123 mg/dL

## 2017-01-13 ENCOUNTER — Ambulatory Visit (INDEPENDENT_AMBULATORY_CARE_PROVIDER_SITE_OTHER): Payer: Medicare HMO | Admitting: Podiatry

## 2017-01-13 ENCOUNTER — Encounter: Payer: Self-pay | Admitting: Podiatry

## 2017-01-13 VITALS — BP 174/86 | HR 73

## 2017-01-13 DIAGNOSIS — B351 Tinea unguium: Secondary | ICD-10-CM | POA: Diagnosis not present

## 2017-01-13 DIAGNOSIS — M79674 Pain in right toe(s): Secondary | ICD-10-CM | POA: Diagnosis not present

## 2017-01-13 DIAGNOSIS — E1142 Type 2 diabetes mellitus with diabetic polyneuropathy: Secondary | ICD-10-CM | POA: Diagnosis not present

## 2017-01-13 DIAGNOSIS — M79675 Pain in left toe(s): Secondary | ICD-10-CM | POA: Diagnosis not present

## 2017-01-13 NOTE — Progress Notes (Signed)
   Subjective:    Patient ID: [name], male    DOB: 1940/10/26, 76 y.o.   MRN: [id number]  HPI    Review of Systems  All other systems reviewed and are negative.      Objective:   Physical Exam        Assessment & Plan:

## 2017-01-13 NOTE — Progress Notes (Signed)
Patient ID: [name], male   DOB: 1941-02-09, 76 y.o.   MRN: [id number]   This patient presents today complaining of elongated and thickened toenails and right and left feet which have become progressively thicker and longer over multiple years and the last several months he is complaining that when he wears his shoes and walking.  Patient is diabetic and denies any history of foot ulceration, claudication or amputation Describes CVA in January 2017 affecting his right lower extremity with gradual improvement with rehabilitation   Objective  Orientated 3  Vascular: Bilateral nonpitting peripheral edema DP pulses 0/4 bilaterally PT pulses 2/4 bilaterally Capillary reflex immediate bilaterally  Neurological: Sensation to 10 g monofilament wire intact 4/5 right 5/5 left Vibratory sensation nonreactive bilaterally Ankle reflex equal and reactive bilaterally  Dermatological: No open skin lesions bilaterally The toenails are elongated, incurvated, discolored, hypertrophic and tender to direct palpation 6-10  Musculoskeletal: No deformities noted bilaterally Manual motor testing dorsi flexion, plantar flexion, inversion, eversion 5/5 bilaterally Patient walks slowly with assistance of walker  Assessment: Decreased DP pulses without history of open lesions Diabetic peripheral neuropathy, polyneuropathy Symptomatic onychomycoses 6-10   Plan: Today I reviewed the results of examination with patient today and recommended debridement of the mycotic toenails and he verbally consents  The toenails 6-10 were debrided mechanically and electrically without any bleeding  Reappoint 4 months

## 2017-01-13 NOTE — Patient Instructions (Signed)

## 2017-01-15 ENCOUNTER — Encounter: Payer: Self-pay | Admitting: "Endocrinology

## 2017-01-15 ENCOUNTER — Ambulatory Visit (INDEPENDENT_AMBULATORY_CARE_PROVIDER_SITE_OTHER): Payer: Medicare HMO | Admitting: "Endocrinology

## 2017-01-15 VITALS — BP 156/63 | HR 82 | Ht 70.0 in | Wt 259.0 lb

## 2017-01-15 DIAGNOSIS — I10 Essential (primary) hypertension: Secondary | ICD-10-CM | POA: Diagnosis not present

## 2017-01-15 DIAGNOSIS — E782 Mixed hyperlipidemia: Secondary | ICD-10-CM

## 2017-01-15 DIAGNOSIS — Z794 Long term (current) use of insulin: Secondary | ICD-10-CM

## 2017-01-15 DIAGNOSIS — E1121 Type 2 diabetes mellitus with diabetic nephropathy: Secondary | ICD-10-CM

## 2017-01-15 DIAGNOSIS — E038 Other specified hypothyroidism: Secondary | ICD-10-CM

## 2017-01-15 MED ORDER — INSULIN NPH ISOPHANE & REGULAR (70-30) 100 UNIT/ML ~~LOC~~ SUSP: SUBCUTANEOUS | 0 refills | Status: DC

## 2017-01-15 NOTE — Patient Instructions (Signed)

## 2017-01-15 NOTE — Progress Notes (Signed)
Subjective:    Patient ID: [name], male    DOB: [birth year], PCP Fayrene Helper, MD   Past Medical History:  Diagnosis Date  . Abnormality of gait 05/01/2015  . Arthritis    "left leg" (08/30/2013)  . Arthritis of knee   . CAD (coronary artery disease)   . Chronic back pain   . Chronic bronchitis ([place])    "get it q yr"  . COPD (chronic obstructive pulmonary disease) ([place])   . CVA (cerebral infarction)   . Diabetes mellitus, type 1   . GERD (gastroesophageal reflux disease)   . Gout   . Hyperlipidemia   . Hypertension   . Myocardial infarction ([place]) 1999  . Obesity   . On home oxygen therapy    "2L; only at night" (08/30/2013)  . Pneumonia    "a few times; last time was ~ 06/2013"  . Psoriasis   . Sleep apnea    wear CPAP at night  . Stroke ([place]) 05/2015   R sided weakness  . Tussive syncope 05/01/2015  . Vitamin B12 deficiency 05/01/2015   Past Surgical History:  Procedure Laterality Date  . APPENDECTOMY    . CATARACT EXTRACTION W/PHACO Left 10/07/2016   Procedure: CATARACT EXTRACTION PHACO AND INTRAOCULAR LENS PLACEMENT (IOC);  Surgeon: Rutherford Guys, MD;  Location: [facility];  Service: Ophthalmology;  Laterality: Left;  CDE: 13.49  . CATARACT EXTRACTION W/PHACO Right 10/28/2016   Procedure: CATARACT EXTRACTION PHACO AND INTRAOCULAR LENS PLACEMENT (IOC);  Surgeon: Rutherford Guys, MD;  Location: [facility];  Service: Ophthalmology;  Laterality: Right;  CDE: 16.71  . COLONOSCOPY  03/17/2012   Procedure: COLONOSCOPY;  Surgeon: Rogene Houston, MD;  Location: [facility] SU[facility];  Service: Endoscopy;  Laterality: N/A;  830  . CORONARY ANGIOPLASTY WITH STENT PLACEMENT  1999- 2009-08/30/2013   "counting today's, I have 5 stents" (08/30/2013)  . CYSTOSCOPY WITH URETHRAL DILATATION N/A 11/26/2012   Procedure: CYSTOSCOPY WITH URETHRAL DILATATION;  Surgeon: Malka So, MD;  Location: [facility];  Service: Urology;  Laterality: N/A;  . LEFT HEART CATHETERIZATION WITH CORONARY  ANGIOGRAM N/A 08/30/2013   Procedure: LEFT HEART CATHETERIZATION WITH CORONARY ANGIOGRAM;  Surgeon: Clent Demark, MD;  Location: [place] CATH LAB;  Service: Cardiovascular;  Laterality: N/A;  . PERCUTANEOUS CORONARY STENT INTERVENTION (PCI-S) Right 08/30/2013   Procedure: PERCUTANEOUS CORONARY STENT INTERVENTION (PCI-S);  Surgeon: Clent Demark, MD;  Location: [facility] CATH LAB;  Service: Cardiovascular;  Laterality: Right;   Social History   Social History  . Marital status: Married    Spouse name: N/A  . Number of children: 2  . Years of education: 11   Occupational History  . janitorial services     Social History Main Topics  . Smoking status: Former Smoker    Packs/day: 0.50    Years: 33.00    Types: Cigarettes    Quit date: 11/29/2002  . Smokeless tobacco: Never Used     Comment: "stopped smoking in the 1990's  . Alcohol use No  . Drug use: No  . Sexual activity: Not Currently   Other Topics Concern  . None   Social History Narrative   Patient drinks about 3 cups of soda daily.    Patient is left handed.    Outpatient Encounter Prescriptions as of 01/15/2017  Medication Sig  . ACCU-CHEK AVIVA PLUS test strip FOUR TIMES A DAY (Patient taking differently: TWICE A DAY)  . acetaminophen (TYLENOL) 500 MG tablet Take 500  mg by mouth every 6 (six) hours as needed for moderate pain (left thigh).  Marland Kitchen albuterol (PROVENTIL HFA;VENTOLIN HFA) 108 (90 Base) MCG/ACT inhaler Inhale 2 puffs into the lungs every 4 (four) hours as needed for wheezing or shortness of breath.  . allopurinol (ZYLOPRIM) 300 MG tablet TAKE 1 TABLET ONE TIME DAILY  . amLODipine (NORVASC) 10 MG tablet Take 1 tablet (10 mg total) by mouth daily.  Marland Kitchen aspirin 325 MG tablet Take 1 tablet (325 mg total) by mouth daily.  . clopidogrel (PLAVIX) 75 MG tablet Take 1 tablet (75 mg total) by mouth daily.  . ergocalciferol (VITAMIN D2) 50000 units capsule Take 1 capsule (50,000 Units total) by mouth once a week. One capsule once  weekly  . ezetimibe (ZETIA) 10 MG tablet Take 1 tablet (10 mg total) by mouth daily.  . insulin NPH-regular Human (NOVOLIN 70/30 RELION) (70-30) 100 UNIT/ML injection INJECT 30 UNITS INTO SKIN WITH BREAKFAST AND SUPPER WHEN GLUCOSE ID ABOVE 90  . levETIRAcetam (KEPPRA) 500 MG tablet TAKE 1 TABLET (500 MG TOTAL) BY MOUTH 2 (TWO) TIMES DAILY.  Marland Kitchen levothyroxine (SYNTHROID, LEVOTHROID) 88 MCG tablet Take 1 tablet (88 mcg total) by mouth every morning.  Marland Kitchen lisinopril (PRINIVIL,ZESTRIL) 40 MG tablet Take 1 tablet (40 mg total) by mouth daily.  . meclizine (ANTIVERT) 25 MG tablet Take 1 tablet (25 mg total) by mouth 3 (three) times daily as needed for dizziness.  . metFORMIN (GLUCOPHAGE) 500 MG tablet TAKE 1 TABLET TWICE DAILY  . metoprolol tartrate (LOPRESSOR) 50 MG tablet Take 0.5 tablets (25 mg total) by mouth 2 (two) times daily.  . pantoprazole (PROTONIX) 40 MG tablet TAKE 1 TABLET EVERY DAY BEFORE BREAKFAST  . pravastatin (PRAVACHOL) 40 MG tablet Take 2 tablets (80 mg total) by mouth daily.  Marland Kitchen senna (SENOKOT) 8.6 MG tablet Take 1-2 tablets by mouth daily as needed for constipation.   Marland Kitchen spironolactone (ALDACTONE) 25 MG tablet Take 1 tablet (25 mg total) by mouth daily.  . Tiotropium Bromide Monohydrate (SPIRIVA RESPIMAT) 2.5 MCG/ACT AERS Inhale 2.5 mg into the lungs daily as needed (for respiratory issues.).  Marland Kitchen triamcinolone cream (KENALOG) 0.1 % Apply 1 application topically 2 (two) times daily. Apply twice daily to rash on right elbow  , under a moist wrap for 4 weeks (Patient taking differently: Apply 1 application topically 2 (two) times daily as needed (for affected area of skin on elbow). Apply twice daily to rash on right elbow  , under a moist wrap for 4 weeks)  . vitamin B-12 (CYANOCOBALAMIN) 1000 MCG tablet Take 1,000 mcg by mouth daily.  . [DISCONTINUED] insulin NPH-regular Human (NOVOLIN 70/30 RELION) (70-30) 100 UNIT/ML injection INJECT 40 UNITS INTO SKIN WITH BREAKFAST AND SUPPER WHEN  GLUCOSE ID ABOVE 90   No facility-administered encounter medications on file as of 01/15/2017.    ALLERGIES: No Known Allergies VACCINATION STATUS: Immunization History  Administered Date(s) Administered  . Influenza Whole 03/18/2005  . Pneumococcal Conjugate-13 12/08/2013  . Pneumococcal Polysaccharide-23 06/09/2012  . Td 12/04/2003    Diabetes  He presents for his follow-up diabetic visit. He has type 2 diabetes mellitus. Onset time: He was diagnosed at approximate age of 39 years. His disease course has been improving. There are no hypoglycemic associated symptoms. Pertinent negatives for hypoglycemia include no confusion, headaches, pallor or seizures. Pertinent negatives for diabetes include no chest pain, no fatigue, no polydipsia, no polyphagia, no polyuria and no weakness. Symptoms are improving. Diabetic complications include a CVA, heart disease,  nephropathy and peripheral neuropathy. Risk factors for coronary artery disease include dyslipidemia, diabetes mellitus, male sex, obesity, tobacco exposure and sedentary lifestyle. Current diabetic treatment includes insulin injections. He is compliant with treatment most of the time. His weight is increasing steadily. He is following a generally unhealthy diet. When asked about meal planning, he reported none. Exercise: Currently undergoing physical therapy after recent cerebrovascular accident. His breakfast blood glucose range is generally 130-140 mg/dl. His dinner blood glucose range is generally 130-140 mg/dl. His overall blood glucose range is 130-140 mg/dl. An ACE inhibitor/angiotensin II receptor blocker is being taken.  Hyperlipidemia  This is a chronic problem. The current episode started more than 1 year ago. The problem is controlled. Exacerbating diseases include diabetes, hypothyroidism and obesity. Associated symptoms include shortness of breath. Pertinent negatives include no chest pain or myalgias. Current antihyperlipidemic  treatment includes statins. Risk factors for coronary artery disease include diabetes mellitus, dyslipidemia, hypertension, male sex and a sedentary lifestyle.  Hypertension  This is a chronic problem. The current episode started more than 1 year ago. The problem is controlled. Associated symptoms include shortness of breath. Pertinent negatives include no chest pain, headaches, neck pain or palpitations. Past treatments include ACE inhibitors. Hypertensive end-organ damage includes CAD/MI and CVA. Identifiable causes of hypertension include a thyroid problem.  Thyroid Problem  Presents for follow-up visit. Patient reports no constipation, diarrhea, fatigue or palpitations. The symptoms have been stable. Past treatments include levothyroxine. His past medical history is significant for diabetes and hyperlipidemia.     Review of Systems  Constitutional: Negative for chills, fatigue, fever and unexpected weight change.  HENT: Negative for dental problem, mouth sores and trouble swallowing.   Eyes: Negative for visual disturbance.  Respiratory: Positive for cough, shortness of breath and wheezing. Negative for choking and chest tightness.   Cardiovascular: Negative for chest pain, palpitations and leg swelling.  Gastrointestinal: Negative for abdominal distention, abdominal pain, constipation, diarrhea, nausea and vomiting.  Endocrine: Negative for polydipsia, polyphagia and polyuria.  Genitourinary: Negative for dysuria, flank pain, hematuria and urgency.  Musculoskeletal: Positive for gait problem. Negative for back pain, myalgias and neck pain.       He walks with a walker due to recent CVA.  Skin: Negative for pallor, rash and wound.  Neurological: Negative for seizures, syncope, weakness, numbness and headaches.  Psychiatric/Behavioral: Negative.  Negative for confusion and dysphoric mood.    Objective:    BP (!) 156/63   Pulse 82   Ht 5\' 10"  (1.778 m)   Wt 259 lb (117.5 kg)   BMI  37.16 kg/m   Wt Readings from Last 3 Encounters:  01/15/17 259 lb (117.5 kg)  12/01/16 258 lb 6.4 oz (117.2 kg)  10/28/16 251 lb (113.9 kg)    Physical Exam  Constitutional: He is oriented to person, place, and time. He appears well-developed and well-nourished. He is cooperative. No distress.  HENT:  Head: Normocephalic and atraumatic.  Eyes: EOM are normal.  Neck: Normal range of motion. Neck supple. No tracheal deviation present. No thyromegaly present.  Cardiovascular: Normal rate, S1 normal, S2 normal and normal heart sounds.  Exam reveals no gallop.   No murmur heard. Pulses:      Dorsalis pedis pulses are 1+ on the right side, and 1+ on the left side.       Posterior tibial pulses are 1+ on the right side, and 1+ on the left side.  Pulmonary/Chest: Breath sounds normal. No respiratory distress. He has no wheezes.  Abdominal: Soft. Bowel sounds are normal. He exhibits no distension. There is no tenderness. There is no guarding and no CVA tenderness.  Musculoskeletal: He exhibits edema.       Right shoulder: He exhibits no swelling and no deformity.   Mild bilateral pedal and lower leg edema.  Neurological: He is alert and oriented to person, place, and time. He has normal strength and normal reflexes. No cranial nerve deficit or sensory deficit. Gait normal.  Skin: Skin is warm and dry. No rash noted. No cyanosis. Nails show no clubbing.  Psychiatric: He has a normal mood and affect. His speech is normal and behavior is normal. Judgment and thought content normal. Cognition and memory are normal.    CMP     Component Value Date/Time   NA 138 01/09/2017 1031   K 4.3 01/09/2017 1031   CL 101 01/09/2017 1031   CO2 28 01/09/2017 1031   GLUCOSE 86 01/09/2017 1031   BUN 18 01/09/2017 1031   CREATININE 1.17 01/09/2017 1031   CALCIUM 9.3 01/09/2017 1031   PROT 6.7 01/09/2017 1031   ALBUMIN 4.0 01/09/2017 1031   AST 9 (L) 01/09/2017 1031   ALT 6 (L) 01/09/2017 1031   ALKPHOS  62 01/09/2017 1031   BILITOT 0.3 01/09/2017 1031   GFRNONAA 61 11/26/2016 0914   GFRAA 71 11/26/2016 0914     Diabetic Labs (most recent): Lab Results  Component Value Date   HGBA1C 5.9 (H) 01/09/2017   HGBA1C 6.6 (H) 10/10/2016   HGBA1C 6.6 (H) 07/08/2016     Lipid Panel ( most recent) Lipid Panel     Component Value Date/Time   CHOL 105 11/26/2016 0914   TRIG 160 (H) 11/26/2016 0914   HDL 24 (L) 11/26/2016 0914   CHOLHDL 4.4 11/26/2016 0914   VLDL 32 (H) 11/26/2016 0914   LDLCALC 49 11/26/2016 0914   LDLDIRECT 50 08/18/2014 0737      Assessment & Plan:   1. Type 2 diabetes mellitus with diabetic nephropathy, unspecified long term insulin use status (HCC) -His diabetes is  complicated by coronary artery disease, CVA, CKD and patient remains at a high risk for more acute and chronic complications of diabetes which include CAD, CVA, CKD, retinopathy, and neuropathy. These are all discussed in detail with the patient.  Patient came with his meter and log of glucose profile Showing tightly controlled glycemia, A1c improved to 5.9%.    -  Recent labs reviewed.  - I have re-counseled the patient on diet management and weight loss  by adopting a carbohydrate restricted / protein rich  Diet.  - Suggestion is made for him to avoid simple carbohydrates  from his diet including Cakes, Sweet Desserts, Ice Cream, Soda (diet and regular), Sweet Tea, Candies, Chips, Cookies, Store Bought Juices, Alcohol in Excess of  1-2 drinks a day, Artificial Sweeteners, and "Sugar-free" Products. This will help patient to have stable blood glucose profile and potentially avoid unintended weight gain.   - Patient is advised to stick to a routine mealtimes to eat 3 meals  a day and avoid unnecessary snacks ( to snack only to correct hypoglycemia).  - I have approached patient with the following individualized plan to manage diabetes and patient agrees.  - I will lower his  premixed insulin Novolin  70/30 to 30 units with breakfast and 30 units with supper for pre-meal glucose of greater than 90 mg/dL. He is advised to continue strict  monitoring of glucose  4 times a  day-before meals and at bedtime.   - Patient is warned not to take insulin without proper monitoring per orders. -Adjustment parameters are given for hypo and hyperglycemia in writing. -Patient is encouraged to call clinic for blood glucose levels less than 70 or above 300 mg /dl. - I will continue metformin 500 mg by mouth twice a day, therapeutically suitable for patient.  - Patient specific target  for A1c; LDL, HDL, Triglycerides, and  Waist Circumference were discussed in detail.  2) BP/HTN: uncontrolled. Continue current medications including ACEI/ARB. 3) Lipids/HPL:  continue statins. 4)  Weight/Diet: CDE consult in progress, exercise, and carbohydrates information provided.  5) hypothyroidism:  I will continue levothyroxine at 88 g by mouth every morning.   - We discussed about correct intake of levothyroxine, at fasting, with water, separated by at least 30 minutes from breakfast, and separated by more than 4 hours from calcium, iron, multivitamins, acid reflux medications (PPIs). -Patient is made aware of the fact that thyroid hormone replacement is needed for life, dose to be adjusted by periodic monitoring of thyroid function tests.  6) Chronic Care/Health Maintenance:  -Patient is on ACEI/ARB and Statin medications and encouraged to continue to follow up with Ophthalmology, Podiatrist at least yearly or according to recommendations, and advised to  stay away from smoking. I have recommended yearly flu vaccine and pneumonia vaccination at least every 5 years;  and  sleep for at least 7 hours a day.  - I advised him to pick up compression stockings to wear on bilateral lower extremities during the times, to help with dependent edema on bilateral lower extremities.  - Time spent with the patient: 25 min, of  which >50% was spent in reviewing his sugar logs , discussing his hypo- and hyper-glycemic episodes, reviewing his current and  previous labs and insulin doses and developing a plan to avoid hypo- and hyper-glycemia.    - I advised patient to maintain close follow up with Fayrene Helper, MD for primary care needs.   Patient is asked to bring meter and  blood glucose logs during his next visit.   Follow up plan: -Return in about 3 months (around 04/17/2017) for meter, and logs.  Glade Lloyd, MD Phone: [phone]  Fax: [phone]  This note was partially dictated with voice recognition software. Similar sounding words can be transcribed inadequately or may not  be corrected upon review.  01/15/2017, 12:45 PM

## 2017-02-20 ENCOUNTER — Ambulatory Visit: Payer: Self-pay

## 2017-03-04 ENCOUNTER — Other Ambulatory Visit: Payer: Self-pay
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
## 2017-03-06 ENCOUNTER — Emergency Department (HOSPITAL_COMMUNITY): Payer: Medicare HMO

## 2017-03-06 ENCOUNTER — Encounter (HOSPITAL_COMMUNITY): Payer: Self-pay | Admitting: Emergency Medicine

## 2017-03-06 ENCOUNTER — Emergency Department (HOSPITAL_COMMUNITY)
Admission: EM | Admit: 2017-03-06 | Discharge: 2017-03-06 | Disposition: A | Discharge status: Home / Self Care | Payer: Medicare HMO | Attending: Emergency Medicine | Admitting: Emergency Medicine

## 2017-03-06 DIAGNOSIS — I10 Essential (primary) hypertension: Secondary | ICD-10-CM | POA: Diagnosis not present

## 2017-03-06 DIAGNOSIS — E039 Hypothyroidism, unspecified: Secondary | ICD-10-CM | POA: Diagnosis not present

## 2017-03-06 DIAGNOSIS — Z794 Long term (current) use of insulin: Secondary | ICD-10-CM | POA: Insufficient documentation

## 2017-03-06 DIAGNOSIS — I2510 Atherosclerotic heart disease of native coronary artery without angina pectoris: Secondary | ICD-10-CM | POA: Diagnosis not present

## 2017-03-06 DIAGNOSIS — E1140 Type 2 diabetes mellitus with diabetic neuropathy, unspecified: Secondary | ICD-10-CM | POA: Diagnosis not present

## 2017-03-06 DIAGNOSIS — L03116 Cellulitis of left lower limb: Secondary | ICD-10-CM | POA: Diagnosis not present

## 2017-03-06 DIAGNOSIS — J449 Chronic obstructive pulmonary disease, unspecified: Secondary | ICD-10-CM | POA: Insufficient documentation

## 2017-03-06 DIAGNOSIS — M79605 Pain in left leg: Secondary | ICD-10-CM | POA: Diagnosis not present

## 2017-03-06 DIAGNOSIS — Z8673 Personal history of transient ischemic attack (TIA), and cerebral infarction without residual deficits: Secondary | ICD-10-CM | POA: Insufficient documentation

## 2017-03-06 DIAGNOSIS — Z955 Presence of coronary angioplasty implant and graft: Secondary | ICD-10-CM | POA: Diagnosis not present

## 2017-03-06 DIAGNOSIS — Z87891 Personal history of nicotine dependence: Secondary | ICD-10-CM | POA: Insufficient documentation

## 2017-03-06 DIAGNOSIS — M79662 Pain in left lower leg: Secondary | ICD-10-CM | POA: Diagnosis not present

## 2017-03-06 DIAGNOSIS — Z7982 Long term (current) use of aspirin: Secondary | ICD-10-CM | POA: Diagnosis not present

## 2017-03-06 DIAGNOSIS — S8992XA Unspecified injury of left lower leg, initial encounter: Secondary | ICD-10-CM | POA: Diagnosis not present

## 2017-03-06 DIAGNOSIS — M7989 Other specified soft tissue disorders: Secondary | ICD-10-CM | POA: Diagnosis not present

## 2017-03-06 DIAGNOSIS — Z23 Encounter for immunization: Secondary | ICD-10-CM | POA: Insufficient documentation

## 2017-03-06 MED ORDER — TETANUS-DIPHTH-ACELL PERTUSSIS 5-2.5-18.5 LF-MCG/0.5 IM SUSP
0.5000 mL | Freq: Once | INTRAMUSCULAR | Status: AC
  Administered 2017-03-06: 0.5 mL via INTRAMUSCULAR
  Filled 2017-03-06: qty 0.5

## 2017-03-06 MED ORDER — IPRATROPIUM-ALBUTEROL 0.5-2.5 (3) MG/3ML IN SOLN
3.0000 mL | Freq: Once | RESPIRATORY_TRACT | Status: DC
  Filled 2017-03-06: qty 3

## 2017-03-06 MED ORDER — ACETAMINOPHEN 325 MG PO TABS
650.0000 mg | ORAL_TABLET | Freq: Once | ORAL | Status: AC
  Administered 2017-03-06: 650 mg via ORAL
  Filled 2017-03-06: qty 2

## 2017-03-06 MED ORDER — CEPHALEXIN 500 MG PO CAPS: 500.0000 mg | ORAL_CAPSULE | Freq: Four times a day (QID) | ORAL | 0 refills | Status: DC

## 2017-03-06 NOTE — ED Provider Notes (Signed)
[facility] EMERGENCY DEPARTMENT Provider Note   CSN: [id number] Arrival date & time: 03/06/17  1048     History   Chief Complaint Chief Complaint  Patient presents with  . Fall    HPI [name] is a 76 y.o. male PMH/o of CAD, diabetes who presents with pain and swelling 5 days to the anterior left leg`. Patient reports that symptoms began roughly 5 days ago after he accidentally fell out of bed and hit his leg. Patient is unsure what he hit the leg but states that since then there is a small wound to the area. Patient reports that he has been able to ambulate on it reports that pain is worsened with ambulation.Marland Kitchen He states that he came to the emergency department today because pain has persisted. He took some Tylenol a few days ago but otherwise no other medications.Patient states that he normally has swelling in both lower extremities and right is usually greater than left. Patient denies any SOB but states he "always has a little trouble because of my COPD."  Patient denies any chest pain, fevers, numbness/weakness, orthopnea.  The history is provided by the patient.    Past Medical History:  Diagnosis Date  . Abnormality of gait 05/01/2015  . Arthritis    "left leg" (08/30/2013)  . Arthritis of knee   . CAD (coronary artery disease)   . Chronic back pain   . Chronic bronchitis ([place])    "get it q yr"  . COPD (chronic obstructive pulmonary disease) ([place])   . CVA (cerebral infarction)   . Diabetes mellitus, type 1   . GERD (gastroesophageal reflux disease)   . Gout   . Hyperlipidemia   . Hypertension   . Myocardial infarction ([place]) 1999  . Obesity   . On home oxygen therapy    "2L; only at night" (08/30/2013)  . Pneumonia    "a few times; last time was ~ 06/2013"  . Psoriasis   . Sleep apnea    wear CPAP at night  . Stroke ([place]) 05/2015   R sided weakness  . Tussive syncope 05/01/2015  . Vitamin B12 deficiency 05/01/2015    Patient Active Problem List   Diagnosis Date Noted  . Choking 07/13/2015  . History of CVA (cerebrovascular accident) 06/20/2015  . CKD (chronic kidney disease) stage 2, GFR 60-89 ml/min 06/04/2015  . Hypothyroidism 06/04/2015  . Back pain 06/01/2015  . Facial droop 05/15/2015  . Dysarthria 05/15/2015  . Partial seizure ([place]) 05/12/2015  . Right hemiparesis ([place]) 05/12/2015  . Stroke ([place]) 05/10/2015  . GERD (gastroesophageal reflux disease) 05/10/2015  . CVA (cerebral infarction) 05/10/2015  . Vitamin B12 deficiency 05/01/2015  . Abnormality of gait 05/01/2015  . Tussive syncope 05/01/2015  . Recurrent falls 04/29/2015  . Dysphagia 04/26/2015  . COPD exacerbation ([place]) 04/19/2015  . Anemia 04/19/2015  . COPD with acute exacerbation ([place]) 02/11/2015  . Annual physical exam 12/11/2013  . Carotid bruit 12/08/2013  . Angina pectoris ([place]) 08/30/2013  . Type 2 diabetes mellitus with diabetic nephropathy ([place]) 05/22/2013  . COPD (chronic obstructive pulmonary disease) ([place]) 11/22/2012  . Sleep related hypoxia 08/30/2012  . Vitamin D deficiency 12/23/2011  . PSORIASIS 07/18/2010  . Gout 01/13/2010  . Type 2 diabetes mellitus with polyneuropathy ([place]) 11/28/2008  . Mixed hyperlipidemia 10/05/2007  . Obesity (BMI 30.0-34.9) 10/05/2007  . Essential hypertension 10/05/2007  . Coronary atherosclerosis 10/05/2007  . GERD 10/05/2007  . Arthritis of knee, right 10/05/2007  Past Surgical History:  Procedure Laterality Date  . APPENDECTOMY    . CATARACT EXTRACTION W/PHACO Left 10/07/2016   Procedure: CATARACT EXTRACTION PHACO AND INTRAOCULAR LENS PLACEMENT (IOC);  Surgeon: Rutherford Guys, MD;  Location: [facility] ORS;  Service: Ophthalmology;  Laterality: Left;  CDE: 13.49  . CATARACT EXTRACTION W/PHACO Right 10/28/2016   Procedure: CATARACT EXTRACTION PHACO AND INTRAOCULAR LENS PLACEMENT (IOC);  Surgeon: Rutherford Guys, MD;  Location: [facility] ORS;  Service: Ophthalmology;  Laterality: Right;  CDE: 16.71  . COLONOSCOPY   03/17/2012   Procedure: COLONOSCOPY;  Surgeon: Rogene Houston, MD;  Location: [facility] ENDO SUITE;  Service: Endoscopy;  Laterality: N/A;  830  . CORONARY ANGIOPLASTY WITH STENT PLACEMENT  1999- 2009-08/30/2013   "counting today's, I have 5 stents" (08/30/2013)  . CYSTOSCOPY WITH URETHRAL DILATATION N/A 11/26/2012   Procedure: CYSTOSCOPY WITH URETHRAL DILATATION;  Surgeon: Malka So, MD;  Location: [facility] ORS;  Service: Urology;  Laterality: N/A;  . LEFT HEART CATHETERIZATION WITH CORONARY ANGIOGRAM N/A 08/30/2013   Procedure: LEFT HEART CATHETERIZATION WITH CORONARY ANGIOGRAM;  Surgeon: Clent Demark, MD;  Location: [facility] CATH LAB;  Service: Cardiovascular;  Laterality: N/A;  . PERCUTANEOUS CORONARY STENT INTERVENTION (PCI-S) Right 08/30/2013   Procedure: PERCUTANEOUS CORONARY STENT INTERVENTION (PCI-S);  Surgeon: Clent Demark, MD;  Location: [facility] CATH LAB;  Service: Cardiovascular;  Laterality: Right;       Home Medications    Prior to Admission medications   Medication Sig Start Date End Date Taking? Authorizing Provider  ACCU-CHEK AVIVA PLUS test strip FOUR TIMES A DAY Patient taking differently: TWICE A DAY 06/06/16   Cassandria Anger, MD  acetaminophen (TYLENOL) 500 MG tablet Take 500 mg by mouth every 6 (six) hours as needed for moderate pain (left thigh).    [provider]  albuterol (PROVENTIL HFA;VENTOLIN HFA) 108 (90 Base) MCG/ACT inhaler Inhale 2 puffs into the lungs every 4 (four) hours as needed for wheezing or shortness of breath. 06/22/15   Ward, Delice Bison, DO  allopurinol (ZYLOPRIM) 300 MG tablet TAKE 1 TABLET ONE TIME DAILY 09/18/15   Sanjuana Kava, MD  amLODipine (NORVASC) 10 MG tablet Take 1 tablet (10 mg total) by mouth daily. 11/14/16   Fayrene Helper, MD  aspirin 325 MG tablet Take 1 tablet (325 mg total) by mouth daily. 05/12/15   Kathie Dike, MD  cephALEXin (KEFLEX) 500 MG capsule Take 1 capsule (500 mg total) by mouth 4 (four) times daily. 03/06/17    Volanda Napoleon, PA-C  clopidogrel (PLAVIX) 75 MG tablet Take 1 tablet (75 mg total) by mouth daily. 11/14/16   Fayrene Helper, MD  ergocalciferol (VITAMIN D2) 50000 units capsule Take 1 capsule (50,000 Units total) by mouth once a week. One capsule once weekly 11/14/16   Fayrene Helper, MD  ezetimibe (ZETIA) 10 MG tablet Take 1 tablet (10 mg total) by mouth daily. 11/14/16   Fayrene Helper, MD  insulin NPH-regular Human (NOVOLIN 70/30 RELION) (70-30) 100 UNIT/ML injection INJECT 30 UNITS INTO SKIN WITH BREAKFAST AND SUPPER WHEN GLUCOSE ID ABOVE 90 01/15/17   Cassandria Anger, MD  levETIRAcetam (KEPPRA) 500 MG tablet TAKE 1 TABLET (500 MG TOTAL) BY MOUTH 2 (TWO) TIMES DAILY. 11/14/16   Fayrene Helper, MD  levothyroxine (SYNTHROID, LEVOTHROID) 88 MCG tablet Take 1 tablet (88 mcg total) by mouth every morning. 07/14/16   Cassandria Anger, MD  lisinopril (PRINIVIL,ZESTRIL) 40 MG tablet Take 1 tablet (40 mg total) by mouth daily.  11/14/16   Fayrene Helper, MD  meclizine (ANTIVERT) 25 MG tablet Take 1 tablet (25 mg total) by mouth 3 (three) times daily as needed for dizziness. 07/26/16   Jola Schmidt, MD  metFORMIN (GLUCOPHAGE) 500 MG tablet TAKE 1 TABLET TWICE DAILY 12/18/16   Cassandria Anger, MD  metoprolol tartrate (LOPRESSOR) 50 MG tablet Take 0.5 tablets (25 mg total) by mouth 2 (two) times daily. 11/14/16   Fayrene Helper, MD  pantoprazole (PROTONIX) 40 MG tablet TAKE 1 TABLET EVERY DAY BEFORE BREAKFAST 11/14/16   Fayrene Helper, MD  pravastatin (PRAVACHOL) 40 MG tablet Take 2 tablets (80 mg total) by mouth daily. 11/14/16   Fayrene Helper, MD  senna (SENOKOT) 8.6 MG tablet Take 1-2 tablets by mouth daily as needed for constipation.     [provider]  spironolactone (ALDACTONE) 25 MG tablet Take 1 tablet (25 mg total) by mouth daily. 11/14/16   Fayrene Helper, MD  Tiotropium Bromide Monohydrate (SPIRIVA RESPIMAT) 2.5 MCG/ACT AERS Inhale 2.5  mg into the lungs daily as needed (for respiratory issues.).    [provider]  triamcinolone cream (KENALOG) 0.1 % Apply 1 application topically 2 (two) times daily. Apply twice daily to rash on right elbow  , under a moist wrap for 4 weeks Patient taking differently: Apply 1 application topically 2 (two) times daily as needed (for affected area of skin on elbow). Apply twice daily to rash on right elbow  , under a moist wrap for 4 weeks 09/19/15   Fayrene Helper, MD  vitamin B-12 (CYANOCOBALAMIN) 1000 MCG tablet Take 1,000 mcg by mouth daily.    [provider]    Family History Family History  Problem Relation Age of Onset  . Hypertension Mother   . Diabetes Mother   . Hypertension Father   . Diabetes Brother   . Stroke Daughter   . Arthritis Unknown        Family History   . Diabetes Unknown        family History     Social History Social History  Substance Use Topics  . Smoking status: Former Smoker    Packs/day: 0.50    Years: 33.00    Types: Cigarettes    Quit date: 11/29/2002  . Smokeless tobacco: Never Used     Comment: "stopped smoking in the 1990's  . Alcohol use No     Allergies   Patient has no known allergies.   Review of Systems Review of Systems  Constitutional: Negative for fever.  Respiratory: Negative for cough and shortness of breath.   Cardiovascular: Negative for chest pain.  Gastrointestinal: Negative for abdominal pain, nausea and vomiting.  Genitourinary: Negative for dysuria and hematuria.  Musculoskeletal:       Left lower extremity pain.  Neurological: Negative for headaches.     Physical Exam Updated Vital Signs BP (!) 124/59 (BP Location: Right Arm)   Pulse 63   Temp 98.2 F (36.8 C) (Oral)   Resp 18   Ht 5\' 10"  (1.778 m)   Wt 117.5 kg (259 lb)   SpO2 93%   BMI 37.16 kg/m   Physical Exam  Constitutional: He appears well-developed and well-nourished.  Sitting comfortably on examination table  HENT:    Head: Normocephalic and atraumatic.  Eyes: Conjunctivae and EOM are normal. Right eye exhibits no discharge. Left eye exhibits no discharge. No scleral icterus.  Cardiovascular: Normal rate and regular rhythm.   Pulses:  Dorsalis pedis pulses are 2+ on the right side, and 2+ on the left side.  Pulmonary/Chest: Effort normal. He has no decreased breath sounds. He has wheezes.  No evidence of respiratory distress. Able to speak in full sentences without difficulty. Mild wheezing noted throughout the upper lung fields.  Musculoskeletal:  1+ pitting edema noted to BLE, left slightly greater than right. FROM of RLE without difficulty. Limited ROM of LLE secondary to pain. Tenderness palpation to the anterior aspect of the left lower extremity just inferior to the left knee. Left knee is without any overlying warmth, erythema, soft tissue swelling. No tenderness palpation to left ankle.  Neurological: He is alert.  Skin: Skin is warm and dry. Capillary refill takes less than 2 seconds.  Small 3 cm crusty hematoma noted to the anterior aspect of the proximal left tib/fib with mild surrounding warmth and erythema. Good distal cap refill. LLE is not dusky in appearance or cool to touch.   Psychiatric: He has a normal mood and affect. His speech is normal and behavior is normal.  Nursing note and vitals reviewed.    ED Treatments / Results  Labs (all labs ordered are listed, but only abnormal results are displayed) Labs Reviewed - No data to display  EKG  EKG Interpretation None       Radiology Dg Tibia/fibula Left  Result Date: 03/06/2017 CLINICAL DATA:  Pain and swelling of the lower leg after falling out of bit head 4 days ago. EXAM: LEFT TIBIA AND FIBULA - 2 VIEW COMPARISON:  None. FINDINGS: There is no evidence of fracture or other focal bone lesions. Soft tissues are unremarkable except for vascular calcification. IMPRESSION: Negative. Electronically Signed   By: Nelson Chimes M.D.    On: 03/06/2017 11:37    Procedures Procedures (including critical care time)  Medications Ordered in ED Medications  acetaminophen (TYLENOL) tablet 650 mg (650 mg Oral Given 03/06/17 1255)  Tdap (BOOSTRIX) injection 0.5 mL (0.5 mLs Intramuscular Given 03/06/17 1338)     Initial Impression / Assessment and Plan / ED Course  I have reviewed the triage vital signs and the nursing notes.  Pertinent labs & imaging results that were available during my care of the patient were reviewed by me and considered in my medical decision making (see chart for details).     76 year old male who presents with left anterior leg pain 5 days after mechanical fall. Patient states that he fell off the bed and hit it on something but cannot member when he hit it on. Patient is afebrile, non-toxic appearing, sitting comfortably on examination table. Vital signs reviewed and stable. Physical exam shows a small crusty hematoma to the anterior proximal left tib/fib. Consider cellulitis vs infected hematoma. Hstory/physical examination concerning for DVT or septic arthritis. Will obtain XR imaging for evaluation of fracture vs dislocation. Analgesics provided in the department. Also since there is some mild wheezing noted on exam and patient has history of CP, we'll plan to give him a breathing treatment here in the department.  X-rays reviewed. Negative for any acute fracture dislocation. Discussed results with patient. Patient still is not received breathing treatment. Respiratory is been made aware. Patient does not wish to stay for the breathing treatment. Patient with no acute signs of respiratory distress. Wheezing is minimal. Encouraged him to use his albuterol inhalers at home. We'll plan to start him on antibiotic therapy for treatment of [place]. Instructed to follow-up with his primary care doctor next 24-48 hours for further  evaluation. Strict return precautions discussed. Patient expresses understanding and  agreement to plan.    Final Clinical Impressions(s) / ED Diagnoses   Final diagnoses:  Cellulitis of left lower extremity  Leg pain, anterior, left    New Prescriptions Discharge Medication List as of 03/06/2017  2:36 PM    START taking these medications   Details  cephALEXin (KEFLEX) 500 MG capsule Take 1 capsule (500 mg total) by mouth 4 (four) times daily., Starting Fri 03/06/2017, Print         Volanda Napoleon, PA-C 03/08/17 0630    Orlie Dakin, MD 03/09/17 [phone]

## 2017-03-06 NOTE — Discharge Instructions (Signed)
Take antibiotics as directed. Please take all of your antibiotics until finished.  Take Tylenol as needed for pain.   Follow-up with the primary care doctor next 24-48 hours for further evaluation.  Return to the emergency department for any worsening pain, fever, increased redness or swelling, drainage from the site or any other worsening or concerning symptoms.

## 2017-03-06 NOTE — ED Triage Notes (Signed)
Patient states he fell out of the bed Monday and is complaining of pain and swelling to left lower leg.

## 2017-03-06 NOTE — ED Provider Notes (Signed)
Patient fell out of bed 4 days ago injuring his left knee. On exam there is a scabbed lesion with surrounding reddened area and tender area at proximal tibial overlying tibial tuberosity. A shot also complains of mild dyspnea typical of his COPD. He denies orthopnea. No other associated symptoms Full range of motion. No red streaks or tenderness proximally.   Orlie Dakin, MD 03/06/17 [phone]

## 2017-03-17 ENCOUNTER — Encounter (INDEPENDENT_AMBULATORY_CARE_PROVIDER_SITE_OTHER): Payer: Self-pay | Admitting: *Deleted

## 2017-03-17 DIAGNOSIS — Z8673 Personal history of transient ischemic attack (TIA), and cerebral infarction without residual deficits: Secondary | ICD-10-CM | POA: Diagnosis not present

## 2017-03-17 DIAGNOSIS — G4733 Obstructive sleep apnea (adult) (pediatric): Secondary | ICD-10-CM | POA: Diagnosis not present

## 2017-03-19 ENCOUNTER — Encounter: Payer: Self-pay | Admitting: Family Medicine

## 2017-03-19 ENCOUNTER — Ambulatory Visit (INDEPENDENT_AMBULATORY_CARE_PROVIDER_SITE_OTHER): Payer: Medicare HMO | Admitting: Family Medicine

## 2017-03-19 VITALS — BP 140/64 | HR 74 | Resp 16 | Ht 70.0 in | Wt 260.0 lb

## 2017-03-19 DIAGNOSIS — E038 Other specified hypothyroidism: Secondary | ICD-10-CM

## 2017-03-19 DIAGNOSIS — R296 Repeated falls: Secondary | ICD-10-CM

## 2017-03-19 DIAGNOSIS — Z23 Encounter for immunization: Secondary | ICD-10-CM | POA: Diagnosis not present

## 2017-03-19 DIAGNOSIS — E1142 Type 2 diabetes mellitus with diabetic polyneuropathy: Secondary | ICD-10-CM

## 2017-03-19 DIAGNOSIS — I10 Essential (primary) hypertension: Secondary | ICD-10-CM | POA: Diagnosis not present

## 2017-03-19 DIAGNOSIS — E782 Mixed hyperlipidemia: Secondary | ICD-10-CM | POA: Diagnosis not present

## 2017-03-19 NOTE — Patient Instructions (Addendum)
Annual wellness in January, call if you need me before   mD f/u 7month  Flu vaccine today  Fasting lipid, cmp and EGFr  In 5 months,  Thank you  for choosing [facility]. We consider it a privelige to serve you.  Delivering excellent health care in a caring and  compassionate way is our goal.  Partnering with you,  so that together we can achieve this goal is our strategy.

## 2017-03-24 ENCOUNTER — Encounter: Payer: Self-pay | Admitting: Family Medicine
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
## 2017-03-24 NOTE — Assessment & Plan Note (Signed)
Managed by endo and controlled 

## 2017-03-24 NOTE — Assessment & Plan Note (Signed)
Managed by endo and well controled [name] is reminded of the importance of commitment to daily physical activity for 30 minutes or more, as able and the need to limit carbohydrate intake to 30 to 60 grams per meal to help with blood sugar control.   The need to take medication as prescribed, test blood sugar as directed, and to call between visits if there is a concern that blood sugar is uncontrolled is also discussed.   [name] is reminded of the importance of daily foot exam, annual eye examination, and good blood sugar, blood pressure and cholesterol control.  Diabetic Labs Latest Ref Rng & Units 01/09/2017 11/26/2016 10/10/2016 07/26/2016 07/08/2016  HbA1c <5.7 % 5.9(H) - 6.6(H) - 6.6(H)  Microalbumin <2.0 mg/dL - - - - -  Micro/Creat Ratio 0.0 - 30.0 mg/g - - - - -  Chol <200 mg/dL - 105 - - 135  HDL >40 mg/dL - 24(L) - - 24(L)  Calc LDL <100 mg/dL - 49 - - 63  Triglycerides <150 mg/dL - 160(H) - - 241(H)  Creatinine 0.70 - 1.18 mg/dL 1.17 1.17 1.10 1.11 1.12   BP/Weight 03/19/2017 03/06/2017 01/15/2017 01/13/2017 12/01/2016 10/28/2016 1/68/3729  Systolic BP 021 115 520 802 233 612 244  Diastolic BP 64 59 63 86 60 73 68  Wt. (Lbs) 260 259 259 - 258.4 251 251  BMI 37.31 37.16 37.16 - 37.08 36.01 36.01   Foot/eye exam completion dates Latest Ref Rng & Units 12/01/2016 09/30/2016  Eye Exam No Retinopathy - Retinopathy(A)  Foot exam Order - - -  Foot Form Completion - Done -

## 2017-03-24 NOTE — Assessment & Plan Note (Addendum)
UnControlled, no change in medication DASH diet and commitment to daily physical activity for a minimum of 30 minutes discussed and encouraged, as a part of hypertension management. The importance of attaining a healthy weight is also discussed.  BP/Weight 03/19/2017 03/06/2017 01/15/2017 01/13/2017 12/01/2016 10/28/2016 1/43/8887  Systolic BP 579 728 206 015 615 379 432  Diastolic BP 64 59 63 86 60 73 68  Wt. (Lbs) 260 259 259 - 258.4 251 251  BMI 37.31 37.16 37.16 - 37.08 36.01 36.01

## 2017-03-24 NOTE — Assessment & Plan Note (Signed)
Hyperlipidemia:Low fat diet discussed and encouraged.   Lipid Panel  Lab Results  Component Value Date   CHOL 105 11/26/2016   HDL 24 (L) 11/26/2016   LDLCALC 49 11/26/2016   LDLDIRECT 50 08/18/2014   TRIG 160 (H) 11/26/2016   CHOLHDL 4.4 11/26/2016   Elevated TG, needs to reduce fat in diet

## 2017-03-24 NOTE — Assessment & Plan Note (Signed)
Home safety and fall risk reduction discused

## 2017-03-30 ENCOUNTER — Other Ambulatory Visit: Payer: Self-pay

## 2017-03-30 MED ORDER — GLUCOSE BLOOD VI STRP: ORAL_STRIP | 1 refills | Status: DC

## 2017-04-03 ENCOUNTER — Other Ambulatory Visit (INDEPENDENT_AMBULATORY_CARE_PROVIDER_SITE_OTHER): Payer: Self-pay | Admitting: *Deleted

## 2017-04-03 ENCOUNTER — Other Ambulatory Visit: Payer: Self-pay | Admitting: Cardiology

## 2017-04-03 DIAGNOSIS — I2510 Atherosclerotic heart disease of native coronary artery without angina pectoris: Secondary | ICD-10-CM | POA: Diagnosis not present

## 2017-04-03 DIAGNOSIS — I639 Cerebral infarction, unspecified: Secondary | ICD-10-CM | POA: Diagnosis not present

## 2017-04-03 DIAGNOSIS — I119 Hypertensive heart disease without heart failure: Secondary | ICD-10-CM | POA: Diagnosis not present

## 2017-04-03 DIAGNOSIS — I5030 Unspecified diastolic (congestive) heart failure: Secondary | ICD-10-CM | POA: Diagnosis not present

## 2017-04-03 DIAGNOSIS — Z860100 Personal history of colon polyps, unspecified: Secondary | ICD-10-CM

## 2017-04-03 DIAGNOSIS — R0609 Other forms of dyspnea: Secondary | ICD-10-CM | POA: Diagnosis not present

## 2017-04-03 DIAGNOSIS — E119 Type 2 diabetes mellitus without complications: Secondary | ICD-10-CM | POA: Diagnosis not present

## 2017-04-03 DIAGNOSIS — R079 Chest pain, unspecified: Secondary | ICD-10-CM

## 2017-04-03 DIAGNOSIS — I252 Old myocardial infarction: Secondary | ICD-10-CM | POA: Diagnosis not present

## 2017-04-03 DIAGNOSIS — E785 Hyperlipidemia, unspecified: Secondary | ICD-10-CM | POA: Diagnosis not present

## 2017-04-03 HISTORY — DX: Personal history of colonic polyps: Z86.010

## 2017-04-03 HISTORY — DX: Personal history of colon polyps, unspecified: Z86.0100

## 2017-04-20 ENCOUNTER — Ambulatory Visit (HOSPITAL_COMMUNITY)
Admission: RE | Admit: 2017-04-20 | Discharge: 2017-04-20 | Disposition: A | Discharge status: Home / Self Care | Payer: Medicare HMO | Source: Ambulatory Visit | Attending: Cardiology | Admitting: Cardiology

## 2017-04-20 ENCOUNTER — Encounter (HOSPITAL_COMMUNITY): Payer: Self-pay

## 2017-04-20 DIAGNOSIS — I119 Hypertensive heart disease without heart failure: Secondary | ICD-10-CM | POA: Diagnosis not present

## 2017-04-20 DIAGNOSIS — I639 Cerebral infarction, unspecified: Secondary | ICD-10-CM | POA: Diagnosis not present

## 2017-04-20 DIAGNOSIS — R079 Chest pain, unspecified: Secondary | ICD-10-CM | POA: Diagnosis not present

## 2017-04-20 DIAGNOSIS — R9431 Abnormal electrocardiogram [ECG] [EKG]: Secondary | ICD-10-CM | POA: Diagnosis not present

## 2017-04-20 DIAGNOSIS — I2510 Atherosclerotic heart disease of native coronary artery without angina pectoris: Secondary | ICD-10-CM | POA: Diagnosis not present

## 2017-04-20 DIAGNOSIS — R0609 Other forms of dyspnea: Secondary | ICD-10-CM | POA: Diagnosis not present

## 2017-04-20 DIAGNOSIS — E119 Type 2 diabetes mellitus without complications: Secondary | ICD-10-CM | POA: Diagnosis not present

## 2017-04-20 DIAGNOSIS — I252 Old myocardial infarction: Secondary | ICD-10-CM | POA: Diagnosis not present

## 2017-04-20 DIAGNOSIS — I219 Acute myocardial infarction, unspecified: Secondary | ICD-10-CM | POA: Diagnosis not present

## 2017-04-20 DIAGNOSIS — E785 Hyperlipidemia, unspecified: Secondary | ICD-10-CM | POA: Diagnosis not present

## 2017-04-20 DIAGNOSIS — I5030 Unspecified diastolic (congestive) heart failure: Secondary | ICD-10-CM | POA: Diagnosis not present

## 2017-04-20 LAB — HEMOGLOBIN A1C
HEMOGLOBIN A1C: 6.5 % — AB (ref 4.8–5.6)
Mean Plasma Glucose: 139.85 mg/dL

## 2017-04-20 LAB — HEPATIC FUNCTION PANEL
ALK PHOS: 70 U/L (ref 38–126)
ALT: 9 U/L — ABNORMAL LOW (ref 17–63)
AST: 16 U/L (ref 15–41)
Albumin: 3.8 g/dL (ref 3.5–5.0)
BILIRUBIN DIRECT: 0.1 mg/dL (ref 0.1–0.5)
BILIRUBIN INDIRECT: 0.4 mg/dL (ref 0.3–0.9)
BILIRUBIN TOTAL: 0.5 mg/dL (ref 0.3–1.2)
TOTAL PROTEIN: 7.8 g/dL (ref 6.5–8.1)

## 2017-04-20 LAB — BASIC METABOLIC PANEL
Anion gap: 8 (ref 5–15)
BUN: 18 mg/dL (ref 6–20)
CHLORIDE: 104 mmol/L (ref 101–111)
CO2: 25 mmol/L (ref 22–32)
CREATININE: 1.18 mg/dL (ref 0.61–1.24)
Calcium: 9.5 mg/dL (ref 8.9–10.3)
GFR calc Af Amer: >60 mL/min (ref >60)
GFR calc non Af Amer: 59 mL/min — ABNORMAL LOW (ref >60)
Glucose, Bld: 118 mg/dL — ABNORMAL HIGH (ref 65–99)
Potassium: 4.6 mmol/L (ref 3.5–5.1)
SODIUM: 137 mmol/L (ref 135–145)

## 2017-04-20 LAB — LIPID PANEL
Cholesterol: 98 mg/dL (ref 0–200)
HDL: 24 mg/dL — ABNORMAL LOW (ref >40)
LDL CALC: 39 mg/dL (ref 0–99)
TRIGLYCERIDES: 173 mg/dL — AB (ref <150)
Total CHOL/HDL Ratio: 4.1 RATIO
VLDL: 35 mg/dL (ref 0–40)

## 2017-04-20 MED ORDER — TECHNETIUM TC 99M TETROFOSMIN IV KIT
30.0000 | PACK | Freq: Once | INTRAVENOUS | Status: AC | PRN
  Administered 2017-04-20: 30 via INTRAVENOUS

## 2017-04-20 MED ORDER — REGADENOSON 0.4 MG/5ML IV SOLN
0.4000 mg | Freq: Once | INTRAVENOUS | Status: AC
  Administered 2017-04-20: 0.4 mg via INTRAVENOUS

## 2017-04-20 MED ORDER — REGADENOSON 0.4 MG/5ML IV SOLN
INTRAVENOUS | Status: AC
  Filled 2017-04-20: qty 5

## 2017-04-20 MED ORDER — TECHNETIUM TC 99M TETROFOSMIN IV KIT
10.0000 | PACK | Freq: Once | INTRAVENOUS | Status: AC | PRN
  Administered 2017-04-20: 10 via INTRAVENOUS

## 2017-04-21 ENCOUNTER — Encounter: Payer: Self-pay | Admitting: Family Medicine

## 2017-04-21 DIAGNOSIS — Z794 Long term (current) use of insulin: Secondary | ICD-10-CM | POA: Diagnosis not present

## 2017-04-21 DIAGNOSIS — E1121 Type 2 diabetes mellitus with diabetic nephropathy: Secondary | ICD-10-CM | POA: Diagnosis not present

## 2017-04-22 LAB — HEMOGLOBIN A1C
Hgb A1c MFr Bld: 6.3 % of total Hgb — ABNORMAL HIGH (ref <5.7)
Mean Plasma Glucose: 134 (calc)
eAG (mmol/L): 7.4 (calc)

## 2017-04-22 LAB — RENAL FUNCTION PANEL
ALBUMIN MSPROF: 3.9 g/dL (ref 3.6–5.1)
BUN / CREAT RATIO: 18 (calc) (ref 6–22)
BUN: 22 mg/dL (ref 7–25)
CALCIUM: 9.6 mg/dL (ref 8.6–10.3)
CO2: 26 mmol/L (ref 20–32)
Chloride: 103 mmol/L (ref 98–110)
Creat: 1.2 mg/dL — ABNORMAL HIGH (ref 0.70–1.18)
Glucose, Bld: 110 mg/dL (ref 65–139)
Phosphorus: 3.6 mg/dL (ref 2.1–4.3)
Potassium: 4.4 mmol/L (ref 3.5–5.3)
SODIUM: 137 mmol/L (ref 135–146)

## 2017-04-27 ENCOUNTER — Ambulatory Visit: Payer: Medicare HMO | Admitting: "Endocrinology

## 2017-04-28 ENCOUNTER — Other Ambulatory Visit: Payer: Self-pay

## 2017-04-28 NOTE — Patient Outreach (Signed)
[facility]) Care Management  [name]  04/28/2017   [name] 07-04-1940 [id number]  Subjective: Telephone call to patient for every other month call.  Patient reports he is doing good. Patient denies any more falls since last conversation. Patient reports breathing is hard sometimes when he is out in the cold but he tries not to go out much. Discussed with patient preparing for going out in the cold and COPD exacerbation symptoms and when to seek help.  He verbalized understanding.  Patient reports he has a blockage in his heart and he will see the cardiologist this week.  Patient also states he sees the foot doctor and endocrinologist next week for routine appointments.  Patient voices no concerns.  Objective:   Encounter Medications:  Outpatient Encounter Medications as of 04/28/2017  Medication Sig Note  . acetaminophen (TYLENOL) 500 MG tablet Take 500 mg by mouth every 6 (six) hours as needed for moderate pain (left thigh).   Marland Kitchen albuterol (PROVENTIL HFA;VENTOLIN HFA) 108 (90 Base) MCG/ACT inhaler Inhale 2 puffs into the lungs every 4 (four) hours as needed for wheezing or shortness of breath.   . allopurinol (ZYLOPRIM) 300 MG tablet TAKE 1 TABLET ONE TIME DAILY   . amLODipine (NORVASC) 10 MG tablet Take 1 tablet (10 mg total) by mouth daily.   Marland Kitchen aspirin 325 MG tablet Take 1 tablet (325 mg total) by mouth daily.   . clopidogrel (PLAVIX) 75 MG tablet Take 1 tablet (75 mg total) by mouth daily.   . ergocalciferol (VITAMIN D2) 50000 units capsule Take 1 capsule (50,000 Units total) by mouth once a week. One capsule once weekly   . ezetimibe (ZETIA) 10 MG tablet Take 1 tablet (10 mg total) by mouth daily.   Marland Kitchen glucose blood (ACCU-CHEK AVIVA PLUS) test strip FOUR TIMES A DAY   . insulin NPH-regular Human (NOVOLIN 70/30 RELION) (70-30) 100 UNIT/ML injection INJECT 30 UNITS INTO SKIN WITH BREAKFAST AND SUPPER WHEN GLUCOSE ID ABOVE 90   . levETIRAcetam (KEPPRA)  500 MG tablet TAKE 1 TABLET (500 MG TOTAL) BY MOUTH 2 (TWO) TIMES DAILY.   Marland Kitchen levothyroxine (SYNTHROID, LEVOTHROID) 88 MCG tablet Take 1 tablet (88 mcg total) by mouth every morning.   Marland Kitchen lisinopril (PRINIVIL,ZESTRIL) 40 MG tablet Take 1 tablet (40 mg total) by mouth daily.   . meclizine (ANTIVERT) 25 MG tablet Take 1 tablet (25 mg total) by mouth 3 (three) times daily as needed for dizziness.   . metFORMIN (GLUCOPHAGE) 500 MG tablet TAKE 1 TABLET TWICE DAILY   . metoprolol tartrate (LOPRESSOR) 50 MG tablet Take 0.5 tablets (25 mg total) by mouth 2 (two) times daily.   . pantoprazole (PROTONIX) 40 MG tablet TAKE 1 TABLET EVERY DAY BEFORE BREAKFAST   . pravastatin (PRAVACHOL) 40 MG tablet Take 2 tablets (80 mg total) by mouth daily.   Marland Kitchen senna (SENOKOT) 8.6 MG tablet Take 1-2 tablets by mouth daily as needed for constipation.    Marland Kitchen spironolactone (ALDACTONE) 25 MG tablet Take 1 tablet (25 mg total) by mouth daily.   . Tiotropium Bromide Monohydrate (SPIRIVA RESPIMAT) 2.5 MCG/ACT AERS Inhale 2.5 mg into the lungs daily as needed (for respiratory issues.).   Marland Kitchen triamcinolone cream (KENALOG) 0.1 % Apply 1 application topically 2 (two) times daily. Apply twice daily to rash on right elbow  , under a moist wrap for 4 weeks (Patient taking differently: Apply 1 application topically 2 (two) times daily as needed (for affected area of  skin on elbow). Apply twice daily to rash on right elbow  , under a moist wrap for 4 weeks) 04/18/2016: As needed  . vitamin B-12 (CYANOCOBALAMIN) 1000 MCG tablet Take 1,000 mcg by mouth daily.   . cephALEXin (KEFLEX) 500 MG capsule Take 1 capsule (500 mg total) by mouth 4 (four) times daily. (Patient not taking: Reported on 04/28/2017)    No facility-administered encounter medications on file as of 04/28/2017.     Functional Status:  In your present state of health, do you have any difficulty performing the following activities: 10/28/2016 10/03/2016  Hearing? N N  Vision? Y N   Comment - -  Difficulty concentrating or making decisions? N N  Comment - -  Walking or climbing stairs? Y Y  Dressing or bathing? Y N  Doing errands, shopping? - -  Comment - -  Conservation officer, nature and eating ? - -  Using the Toilet? - -  In the past six months, have you accidently leaked urine? - -  Do you have problems with loss of bowel control? - -  Managing your Medications? - -  Managing your Finances? - -  Housekeeping or managing your Housekeeping? - -  Comment - -  Some recent data might be hidden    Fall/Depression Screening: Fall Risk  04/28/2017 03/04/2017 01/15/2017  Falls in the past year? Yes Yes No  Comment - - -  Number falls in past yr: 2 or more 2 or more -  Comment - - -  Injury with Fall? Yes Yes -  Comment - - -  Risk Factor Category  High Fall Risk High Fall Risk -  Risk for fall due to : - - -  Risk for fall due to: Comment - - -  Follow up - - -   PHQ 2/9 Scores 04/28/2017 03/04/2017 01/15/2017 12/26/2016 11/07/2016 10/15/2016 08/20/2016  PHQ - 2 Score 0 0 0 0 0 0 0    Assessment: Patient continues to benefit from care manager outreach for disease management and support.    Plan:  [facility] CM Care Plan Problem One     Most Recent Value  Care Plan Problem One  Recent fall  Role Documenting the Problem One  Care Management Telephonic Coordinator  Care Plan for Problem One  Active  [facility] Long Term Goal   Patient will not have any falls within the next 60 days.  [facility] Long Term Goal Start Date  03/04/17  [facility] Long Term Goal Met Date  04/28/17  Interventions for Problem One Long Term Goal  Patient has had no falls.      [facility] CM Care Plan Problem Two     Most Recent Value  Care Plan Problem Two  COPD, knowledge deficit  Role Documenting the Problem Two  Care Management Telephonic Coordinator  Care Plan for Problem Two  Active  Interventions for Problem Two Long Term Goal   RN CM discussed with patient signs of COPD exacerbation and when to seek assistance.    [facility]  Long Term Goal  Patient will not have COPD exacerbation in the next 60 days  [facility] Long Term Goal Start Date  04/28/17 [goal continued]     RN CM will contact patient in the month of February and patient agrees to next outreach.  Jone Baseman, RN, MSN [facility] Care Management Care Management Coordinator Direct Line [phone] Toll Free: [phone]  Fax: [phone]

## 2017-05-05 ENCOUNTER — Ambulatory Visit: Payer: Medicare HMO | Admitting: Podiatry

## 2017-05-06 ENCOUNTER — Encounter: Payer: Self-pay | Admitting: "Endocrinology

## 2017-05-06 ENCOUNTER — Ambulatory Visit (INDEPENDENT_AMBULATORY_CARE_PROVIDER_SITE_OTHER): Payer: Medicare HMO | Admitting: "Endocrinology

## 2017-05-06 VITALS — BP 146/76 | HR 71 | Ht 70.0 in | Wt 256.0 lb

## 2017-05-06 DIAGNOSIS — E1121 Type 2 diabetes mellitus with diabetic nephropathy: Secondary | ICD-10-CM | POA: Diagnosis not present

## 2017-05-06 DIAGNOSIS — E038 Other specified hypothyroidism: Secondary | ICD-10-CM | POA: Diagnosis not present

## 2017-05-06 DIAGNOSIS — E785 Hyperlipidemia, unspecified: Secondary | ICD-10-CM | POA: Diagnosis not present

## 2017-05-06 DIAGNOSIS — E782 Mixed hyperlipidemia: Secondary | ICD-10-CM

## 2017-05-06 DIAGNOSIS — M109 Gout, unspecified: Secondary | ICD-10-CM | POA: Diagnosis not present

## 2017-05-06 DIAGNOSIS — I5030 Unspecified diastolic (congestive) heart failure: Secondary | ICD-10-CM | POA: Diagnosis not present

## 2017-05-06 DIAGNOSIS — I252 Old myocardial infarction: Secondary | ICD-10-CM | POA: Diagnosis not present

## 2017-05-06 DIAGNOSIS — I119 Hypertensive heart disease without heart failure: Secondary | ICD-10-CM | POA: Diagnosis not present

## 2017-05-06 DIAGNOSIS — I10 Essential (primary) hypertension: Secondary | ICD-10-CM

## 2017-05-06 DIAGNOSIS — K219 Gastro-esophageal reflux disease without esophagitis: Secondary | ICD-10-CM | POA: Diagnosis not present

## 2017-05-06 DIAGNOSIS — Z794 Long term (current) use of insulin: Secondary | ICD-10-CM | POA: Diagnosis not present

## 2017-05-06 DIAGNOSIS — I25118 Atherosclerotic heart disease of native coronary artery with other forms of angina pectoris: Secondary | ICD-10-CM | POA: Diagnosis not present

## 2017-05-06 DIAGNOSIS — E119 Type 2 diabetes mellitus without complications: Secondary | ICD-10-CM | POA: Diagnosis not present

## 2017-05-06 DIAGNOSIS — I2510 Atherosclerotic heart disease of native coronary artery without angina pectoris: Secondary | ICD-10-CM | POA: Diagnosis not present

## 2017-05-06 DIAGNOSIS — I639 Cerebral infarction, unspecified: Secondary | ICD-10-CM | POA: Diagnosis not present

## 2017-05-06 NOTE — Progress Notes (Signed)
Subjective:    Patient ID: [name], male    DOB: 10-31-1940, PCP Fayrene Helper, MD   Past Medical History:  Diagnosis Date  . Abnormality of gait 05/01/2015  . Arthritis    "left leg" (08/30/2013)  . Arthritis of knee   . CAD (coronary artery disease)   . Chronic back pain   . Chronic bronchitis ([place])    "get it q yr"  . COPD (chronic obstructive pulmonary disease) ([place])   . CVA (cerebral infarction)   . Diabetes mellitus, type 1   . GERD (gastroesophageal reflux disease)   . Gout   . Hyperlipidemia   . Hypertension   . Myocardial infarction ([place]) 1999  . Obesity   . On home oxygen therapy    "2L; only at night" (08/30/2013)  . Pneumonia    "a few times; last time was ~ 06/2013"  . Psoriasis   . Sleep apnea    wear CPAP at night  . Stroke ([place]) 05/2015   R sided weakness  . Tussive syncope 05/01/2015  . Vitamin B12 deficiency 05/01/2015   Past Surgical History:  Procedure Laterality Date  . APPENDECTOMY    . CATARACT EXTRACTION W/PHACO Left 10/07/2016   Procedure: CATARACT EXTRACTION PHACO AND INTRAOCULAR LENS PLACEMENT (IOC);  Surgeon: Rutherford Guys, MD;  Location: [facility] ORS;  Service: Ophthalmology;  Laterality: Left;  CDE: 13.49  . CATARACT EXTRACTION W/PHACO Right 10/28/2016   Procedure: CATARACT EXTRACTION PHACO AND INTRAOCULAR LENS PLACEMENT (IOC);  Surgeon: Rutherford Guys, MD;  Location: [facility] ORS;  Service: Ophthalmology;  Laterality: Right;  CDE: 16.71  . COLONOSCOPY  03/17/2012   Procedure: COLONOSCOPY;  Surgeon: Rogene Houston, MD;  Location: [facility] ENDO SUITE;  Service: Endoscopy;  Laterality: N/A;  830  . CORONARY ANGIOPLASTY WITH STENT PLACEMENT  1999- 2009-08/30/2013   "counting today's, I have 5 stents" (08/30/2013)  . CYSTOSCOPY WITH URETHRAL DILATATION N/A 11/26/2012   Procedure: CYSTOSCOPY WITH URETHRAL DILATATION;  Surgeon: Malka So, MD;  Location: [facility] ORS;  Service: Urology;  Laterality: N/A;  . LEFT HEART CATHETERIZATION WITH CORONARY  ANGIOGRAM N/A 08/30/2013   Procedure: LEFT HEART CATHETERIZATION WITH CORONARY ANGIOGRAM;  Surgeon: Clent Demark, MD;  Location: [facility] CATH LAB;  Service: Cardiovascular;  Laterality: N/A;  . PERCUTANEOUS CORONARY STENT INTERVENTION (PCI-S) Right 08/30/2013   Procedure: PERCUTANEOUS CORONARY STENT INTERVENTION (PCI-S);  Surgeon: Clent Demark, MD;  Location: [facility] CATH LAB;  Service: Cardiovascular;  Laterality: Right;   Social History   Socioeconomic History  . Marital status: Married    Spouse name: None  . Number of children: 2  . Years of education: 84  . Highest education level: None  Social Needs  . Financial resource strain: None  . Food insecurity - worry: None  . Food insecurity - inability: None  . Transportation needs - medical: None  . Transportation needs - non-medical: None  Occupational History  . Occupation: Brewing technologist   Tobacco Use  . Smoking status: Former Smoker    Packs/day: 0.50    Years: 33.00    Pack years: 16.50    Types: Cigarettes    Last attempt to quit: 11/29/2002    Years since quitting: 14.4  . Smokeless tobacco: Never Used  . Tobacco comment: "stopped smoking in the 1990's  Substance and Sexual Activity  . Alcohol use: No  . Drug use: No  . Sexual activity: Not Currently  Other Topics Concern  . None  Social History  Narrative   Patient drinks about 3 cups of soda daily.    Patient is left handed.    Outpatient Encounter Medications as of 05/06/2017  Medication Sig  . acetaminophen (TYLENOL) 500 MG tablet Take 500 mg by mouth every 6 (six) hours as needed for moderate pain (left thigh).  Marland Kitchen albuterol (PROVENTIL HFA;VENTOLIN HFA) 108 (90 Base) MCG/ACT inhaler Inhale 2 puffs into the lungs every 4 (four) hours as needed for wheezing or shortness of breath.  . allopurinol (ZYLOPRIM) 300 MG tablet TAKE 1 TABLET ONE TIME DAILY  . amLODipine (NORVASC) 10 MG tablet Take 1 tablet (10 mg total) by mouth daily.  Marland Kitchen aspirin 325 MG tablet Take 1  tablet (325 mg total) by mouth daily.  . clopidogrel (PLAVIX) 75 MG tablet Take 1 tablet (75 mg total) by mouth daily.  . ergocalciferol (VITAMIN D2) 50000 units capsule Take 1 capsule (50,000 Units total) by mouth once a week. One capsule once weekly  . ezetimibe (ZETIA) 10 MG tablet Take 1 tablet (10 mg total) by mouth daily.  Marland Kitchen glucose blood (ACCU-CHEK AVIVA PLUS) test strip FOUR TIMES A DAY  . insulin NPH-regular Human (NOVOLIN 70/30 RELION) (70-30) 100 UNIT/ML injection INJECT 30 UNITS INTO SKIN WITH BREAKFAST AND SUPPER WHEN GLUCOSE ID ABOVE 90  . levothyroxine (SYNTHROID, LEVOTHROID) 88 MCG tablet Take 1 tablet (88 mcg total) by mouth every morning.  Marland Kitchen lisinopril (PRINIVIL,ZESTRIL) 40 MG tablet Take 1 tablet (40 mg total) by mouth daily.  . meclizine (ANTIVERT) 25 MG tablet Take 1 tablet (25 mg total) by mouth 3 (three) times daily as needed for dizziness.  . metFORMIN (GLUCOPHAGE) 500 MG tablet TAKE 1 TABLET TWICE DAILY  . metoprolol tartrate (LOPRESSOR) 50 MG tablet Take 0.5 tablets (25 mg total) by mouth 2 (two) times daily.  . pantoprazole (PROTONIX) 40 MG tablet TAKE 1 TABLET EVERY DAY BEFORE BREAKFAST  . pravastatin (PRAVACHOL) 40 MG tablet Take 2 tablets (80 mg total) by mouth daily.  Marland Kitchen senna (SENOKOT) 8.6 MG tablet Take 1-2 tablets by mouth daily as needed for constipation.   Marland Kitchen spironolactone (ALDACTONE) 25 MG tablet Take 1 tablet (25 mg total) by mouth daily.  . Tiotropium Bromide Monohydrate (SPIRIVA RESPIMAT) 2.5 MCG/ACT AERS Inhale 2.5 mg into the lungs daily as needed (for respiratory issues.).  Marland Kitchen triamcinolone cream (KENALOG) 0.1 % Apply 1 application topically 2 (two) times daily. Apply twice daily to rash on right elbow  , under a moist wrap for 4 weeks (Patient taking differently: Apply 1 application topically 2 (two) times daily as needed (for affected area of skin on elbow). Apply twice daily to rash on right elbow  , under a moist wrap for 4 weeks)  . vitamin B-12  (CYANOCOBALAMIN) 1000 MCG tablet Take 1,000 mcg by mouth daily.  . [DISCONTINUED] cephALEXin (KEFLEX) 500 MG capsule Take 1 capsule (500 mg total) by mouth 4 (four) times daily. (Patient not taking: Reported on 04/28/2017)  . [DISCONTINUED] levETIRAcetam (KEPPRA) 500 MG tablet TAKE 1 TABLET (500 MG TOTAL) BY MOUTH 2 (TWO) TIMES DAILY.   No facility-administered encounter medications on file as of 05/06/2017.    ALLERGIES: No Known Allergies VACCINATION STATUS: Immunization History  Administered Date(s) Administered  . Influenza Whole 03/18/2005  . Influenza,inj,Quad PF,6+ Mos 03/19/2017  . Pneumococcal Conjugate-13 12/08/2013  . Pneumococcal Polysaccharide-23 06/09/2012  . Td 12/04/2003  . Tdap 03/06/2017    Diabetes  He presents for his follow-up diabetic visit. He has type 2 diabetes mellitus. Onset  time: He was diagnosed at approximate age of 39 years. His disease course has been stable. There are no hypoglycemic associated symptoms. Pertinent negatives for hypoglycemia include no confusion, headaches, pallor or seizures. Pertinent negatives for diabetes include no chest pain, no fatigue, no polydipsia, no polyphagia, no polyuria and no weakness. Symptoms are stable. Diabetic complications include a CVA, heart disease, nephropathy and peripheral neuropathy. Risk factors for coronary artery disease include dyslipidemia, diabetes mellitus, male sex, obesity, tobacco exposure and sedentary lifestyle. Current diabetic treatment includes insulin injections. He is compliant with treatment most of the time. His weight is stable. He is following a generally unhealthy diet. When asked about meal planning, he reported none. His breakfast blood glucose range is generally 130-140 mg/dl. His dinner blood glucose range is generally 130-140 mg/dl. His overall blood glucose range is 130-140 mg/dl. An ACE inhibitor/angiotensin II receptor blocker is being taken. Eye exam is current.  Hyperlipidemia  This is  a chronic problem. The current episode started more than 1 year ago. The problem is controlled. Exacerbating diseases include diabetes, hypothyroidism and obesity. Associated symptoms include shortness of breath. Pertinent negatives include no chest pain or myalgias. Current antihyperlipidemic treatment includes statins. Risk factors for coronary artery disease include diabetes mellitus, dyslipidemia, hypertension, male sex and a sedentary lifestyle.  Hypertension  This is a chronic problem. The current episode started more than 1 year ago. The problem is controlled. Associated symptoms include shortness of breath. Pertinent negatives include no chest pain, headaches, neck pain or palpitations. Past treatments include ACE inhibitors. Hypertensive end-organ damage includes CAD/MI and CVA. Identifiable causes of hypertension include a thyroid problem.  Thyroid Problem  Presents for follow-up visit. Patient reports no constipation, diarrhea, fatigue or palpitations. The symptoms have been stable. Past treatments include levothyroxine. His past medical history is significant for diabetes and hyperlipidemia.     Review of Systems  Constitutional: Negative for chills, fatigue, fever and unexpected weight change.  HENT: Negative for dental problem, mouth sores and trouble swallowing.   Eyes: Negative for visual disturbance.  Respiratory: Positive for cough, shortness of breath and wheezing. Negative for choking and chest tightness.   Cardiovascular: Negative for chest pain, palpitations and leg swelling.  Gastrointestinal: Negative for abdominal distention, abdominal pain, constipation, diarrhea, nausea and vomiting.  Endocrine: Negative for polydipsia, polyphagia and polyuria.  Genitourinary: Negative for dysuria, flank pain, hematuria and urgency.  Musculoskeletal: Positive for gait problem. Negative for back pain, myalgias and neck pain.       He walks with a walker due to recent CVA.  Skin: Negative  for pallor, rash and wound.  Neurological: Negative for seizures, syncope, weakness, numbness and headaches.  Psychiatric/Behavioral: Negative.  Negative for confusion and dysphoric mood.    Objective:    BP (!) 146/76   Pulse 71   Ht 5\' 10"  (1.778 m)   Wt 256 lb (116.1 kg)   BMI 36.73 kg/m   Wt Readings from Last 3 Encounters:  05/06/17 256 lb (116.1 kg)  03/19/17 260 lb (117.9 kg)  03/06/17 259 lb (117.5 kg)    Physical Exam  Constitutional: He is oriented to person, place, and time. He appears well-developed and well-nourished. He is cooperative. No distress.  HENT:  Head: Normocephalic and atraumatic.  Eyes: EOM are normal.  Neck: Normal range of motion. Neck supple. No tracheal deviation present. No thyromegaly present.  Cardiovascular: Normal rate, S1 normal, S2 normal and normal heart sounds. Exam reveals no gallop.  No murmur heard. Pulses:  Dorsalis pedis pulses are 1+ on the right side, and 1+ on the left side.       Posterior tibial pulses are 1+ on the right side, and 1+ on the left side.  Pulmonary/Chest: Breath sounds normal. No respiratory distress. He has no wheezes.  Abdominal: Soft. Bowel sounds are normal. He exhibits no distension. There is no tenderness. There is no guarding and no CVA tenderness.  Musculoskeletal: He exhibits edema.       Right shoulder: He exhibits no swelling and no deformity.   Mild bilateral pedal and lower leg edema.  Neurological: He is alert and oriented to person, place, and time. He has normal strength and normal reflexes. No cranial nerve deficit or sensory deficit. Gait normal.  Skin: Skin is warm and dry. No rash noted. No cyanosis. Nails show no clubbing.  Psychiatric: He has a normal mood and affect. His speech is normal and behavior is normal. Judgment and thought content normal. Cognition and memory are normal.    CMP     Component Value Date/Time   NA 137 04/21/2017 1013   K 4.4 04/21/2017 1013   CL 103  04/21/2017 1013   CO2 26 04/21/2017 1013   GLUCOSE 110 04/21/2017 1013   BUN 22 04/21/2017 1013   CREATININE 1.20 (H) 04/21/2017 1013   CALCIUM 9.6 04/21/2017 1013   PROT 7.8 04/20/2017 1130   ALBUMIN 3.8 04/20/2017 1130   AST 16 04/20/2017 1130   ALT 9 (L) 04/20/2017 1130   ALKPHOS 70 04/20/2017 1130   BILITOT 0.5 04/20/2017 1130   GFRNONAA 59 (L) 04/20/2017 1130   GFRNONAA 61 11/26/2016 0914   GFRAA >60 04/20/2017 1130   GFRAA 71 11/26/2016 0914     Diabetic Labs (most recent): Lab Results  Component Value Date   HGBA1C 6.3 (H) 04/21/2017   HGBA1C 6.5 (H) 04/20/2017   HGBA1C 5.9 (H) 01/09/2017     Lipid Panel ( most recent) Lipid Panel     Component Value Date/Time   CHOL 98 04/20/2017 1130   TRIG 173 (H) 04/20/2017 1130   HDL 24 (L) 04/20/2017 1130   CHOLHDL 4.1 04/20/2017 1130   VLDL 35 04/20/2017 1130   LDLCALC 39 04/20/2017 1130   LDLDIRECT 50 08/18/2014 0737      Assessment & Plan:   1. Type 2 diabetes mellitus with multiple competitions including diabetic nephropathy -His diabetes is  complicated by coronary artery disease, CVA, CKD and patient remains at a high risk for more acute and chronic complications of diabetes which include CAD, CVA, CKD, retinopathy, and neuropathy. These are all discussed in detail with the patient.  Patient came with his meter and log of glucose profile showing tightly controlled glycemia. - His most recent labs showed A1c of 6.3%.    -  Recent labs reviewed.  - I have re-counseled the patient on diet management and weight loss  by adopting a carbohydrate restricted / protein rich  Diet.  -  Suggestion is made for him to avoid simple carbohydrates  from his diet including Cakes, Sweet Desserts / Pastries, Ice Cream, Soda (diet and regular), Sweet Tea, Candies, Chips, Cookies, Store Bought Juices, Alcohol in Excess of  1-2 drinks a day, Artificial Sweeteners, and "Sugar-free" Products. This will help patient to have stable blood  glucose profile and potentially avoid unintended weight gain.  - Patient is advised to stick to a routine mealtimes to eat 3 meals  a day and avoid unnecessary snacks ( to snack  only to correct hypoglycemia).  - I have approached patient with the following individualized plan to manage diabetes and patient agrees.  - He has done reasonably very well on premixed Novolin 70/30/30. He wishes to stay on the same regimen for cost reasons. I advised him to continue Novolin 70/30 30 units withakfast and 30 units with supper for pre-meal glucose of greater than 90 mg/dL. He is advised to continue strict  monitoring of glucose  4 times a day-before meals and at bedtime.   - Patient is warned not to take insulin without proper monitoring per orders.  -Patient is encouraged to call clinic for blood glucose levels less than 70 or above 300 mg /dl. -  I will continue metformin 500 mg by mouth twice a day, therapeutically suitable for patient.  - Patient specific target  for A1c; LDL, HDL, Triglycerides, and  Waist Circumference were discussed in detail.  2) BP/HTN: Uncontrolled, I advised him to continue his current blood pressure medications including  including ACEI/ARB. 3) Lipids/HPL:  continue statins. 4)  Weight/Diet: CDE consult in progress, exercise, and carbohydrates information provided.  5) hypothyroidism:  - Recent thyroid function tests are consistent with appropriate replacement. He will be continued on the same dose of levothyroxine 88 g by mouth every morning.   - We discussed about correct intake of levothyroxine, at fasting, with water, separated by at least 30 minutes from breakfast, and separated by more than 4 hours from calcium, iron, multivitamins, acid reflux medications (PPIs). -Patient is made aware of the fact that thyroid hormone replacement is needed for life, dose to be adjusted by periodic monitoring of thyroid function tests.  6) Chronic Care/Health  Maintenance:  -Patient is on ACEI/ARB and Statin medications and encouraged to continue to follow up with Ophthalmology, nephrology,  Podiatrist at least yearly or according to recommendations, and advised to  stay away from smoking. I have recommended yearly flu vaccine and pneumonia vaccination at least every 5 years;  and  sleep for at least 7 hours a day.  - I advised him to pick up compression stockings to wear on bilateral lower extremities during the times, to help with dependent edema on bilateral lower extremities.  - Time spent with the patient: 25 min, of which >50% was spent in reviewing his sugar logs , discussing his hypo- and hyper-glycemic episodes, reviewing his current and  previous labs and insulin doses and developing a plan to avoid hypo- and hyper-glycemia.   - I advised patient to maintain close follow up with Fayrene Helper, MD for primary care needs.  Follow up plan: -Return in about 4 months (around 09/04/2017) for meter, and logs, follow up with pre-visit labs, meter, and logs.  Glade Lloyd, MD Phone: [phone]  Fax: [phone]  This note was partially dictated with voice recognition software. Similar sounding words can be transcribed inadequately or may not  be corrected upon review. -  This note was partially dictated with voice recognition software. Similar sounding words can be transcribed inadequately or may not  be corrected upon review.  05/06/2017, 10:42 AM

## 2017-05-06 NOTE — Patient Instructions (Signed)

## 2017-05-07 ENCOUNTER — Ambulatory Visit (HOSPITAL_COMMUNITY)
Admission: RE | Admit: 2017-05-07 | Discharge: 2017-05-08 | Disposition: A | Discharge status: Home / Self Care | Payer: Medicare HMO | Source: Ambulatory Visit | Attending: Cardiology | Admitting: Cardiology

## 2017-05-07 ENCOUNTER — Encounter (HOSPITAL_COMMUNITY): Payer: Self-pay | Admitting: General Practice

## 2017-05-07 ENCOUNTER — Other Ambulatory Visit: Payer: Self-pay

## 2017-05-07 ENCOUNTER — Encounter (HOSPITAL_COMMUNITY)
Admission: RE | Disposition: A | Discharge status: Home / Self Care | Payer: Self-pay | Source: Ambulatory Visit | Attending: Cardiology

## 2017-05-07 DIAGNOSIS — Z87891 Personal history of nicotine dependence: Secondary | ICD-10-CM | POA: Diagnosis not present

## 2017-05-07 DIAGNOSIS — E785 Hyperlipidemia, unspecified: Secondary | ICD-10-CM | POA: Diagnosis not present

## 2017-05-07 DIAGNOSIS — Z8673 Personal history of transient ischemic attack (TIA), and cerebral infarction without residual deficits: Secondary | ICD-10-CM | POA: Diagnosis not present

## 2017-05-07 DIAGNOSIS — I252 Old myocardial infarction: Secondary | ICD-10-CM | POA: Insufficient documentation

## 2017-05-07 DIAGNOSIS — I25119 Atherosclerotic heart disease of native coronary artery with unspecified angina pectoris: Secondary | ICD-10-CM | POA: Insufficient documentation

## 2017-05-07 DIAGNOSIS — D638 Anemia in other chronic diseases classified elsewhere: Secondary | ICD-10-CM | POA: Diagnosis not present

## 2017-05-07 DIAGNOSIS — M109 Gout, unspecified: Secondary | ICD-10-CM | POA: Insufficient documentation

## 2017-05-07 DIAGNOSIS — I110 Hypertensive heart disease with heart failure: Secondary | ICD-10-CM | POA: Diagnosis not present

## 2017-05-07 DIAGNOSIS — I5030 Unspecified diastolic (congestive) heart failure: Secondary | ICD-10-CM | POA: Diagnosis not present

## 2017-05-07 DIAGNOSIS — G4734 Idiopathic sleep related nonobstructive alveolar hypoventilation: Secondary | ICD-10-CM

## 2017-05-07 DIAGNOSIS — Z955 Presence of coronary angioplasty implant and graft: Secondary | ICD-10-CM

## 2017-05-07 DIAGNOSIS — D649 Anemia, unspecified: Secondary | ICD-10-CM | POA: Diagnosis not present

## 2017-05-07 DIAGNOSIS — I209 Angina pectoris, unspecified: Secondary | ICD-10-CM | POA: Diagnosis present

## 2017-05-07 DIAGNOSIS — E119 Type 2 diabetes mellitus without complications: Secondary | ICD-10-CM | POA: Diagnosis not present

## 2017-05-07 DIAGNOSIS — K219 Gastro-esophageal reflux disease without esophagitis: Secondary | ICD-10-CM | POA: Insufficient documentation

## 2017-05-07 DIAGNOSIS — I639 Cerebral infarction, unspecified: Secondary | ICD-10-CM | POA: Diagnosis not present

## 2017-05-07 HISTORY — DX: Dependence on other enabling machines and devices: Z99.89

## 2017-05-07 HISTORY — DX: Type 2 diabetes mellitus without complications: E11.9

## 2017-05-07 HISTORY — DX: Cerebral infarction, unspecified: I63.9

## 2017-05-07 HISTORY — PX: CORONARY STENT INTERVENTION: CATH118234

## 2017-05-07 HISTORY — PX: LEFT HEART CATH AND CORONARY ANGIOGRAPHY: CATH118249

## 2017-05-07 HISTORY — DX: Obstructive sleep apnea (adult) (pediatric): G47.33

## 2017-05-07 HISTORY — PX: CORONARY ANGIOPLASTY WITH STENT PLACEMENT: SHX49

## 2017-05-07 HISTORY — DX: Other chronic pain: G89.29

## 2017-05-07 HISTORY — DX: Low back pain, unspecified: M54.50

## 2017-05-07 HISTORY — DX: Low back pain: M54.5

## 2017-05-07 LAB — GLUCOSE, CAPILLARY
GLUCOSE-CAPILLARY: 111 mg/dL — AB (ref 65–99)
GLUCOSE-CAPILLARY: 115 mg/dL — AB (ref 65–99)
Glucose-Capillary: 112 mg/dL — ABNORMAL HIGH (ref 65–99)
Glucose-Capillary: 90 mg/dL (ref 65–99)

## 2017-05-07 LAB — BASIC METABOLIC PANEL
ANION GAP: 9 (ref 5–15)
BUN: 19 mg/dL (ref 6–20)
CALCIUM: 9.3 mg/dL (ref 8.9–10.3)
CO2: 27 mmol/L (ref 22–32)
Chloride: 101 mmol/L (ref 101–111)
Creatinine, Ser: 1.25 mg/dL — ABNORMAL HIGH (ref 0.61–1.24)
GFR calc Af Amer: >60 mL/min (ref >60)
GFR calc non Af Amer: 55 mL/min — ABNORMAL LOW (ref >60)
GLUCOSE: 121 mg/dL — AB (ref 65–99)
POTASSIUM: 4.1 mmol/L (ref 3.5–5.1)
Sodium: 137 mmol/L (ref 135–145)

## 2017-05-07 LAB — CBC
HEMATOCRIT: 31.7 % — AB (ref 39.0–52.0)
HEMOGLOBIN: 10.1 g/dL — AB (ref 13.0–17.0)
MCH: 26.2 pg (ref 26.0–34.0)
MCHC: 31.9 g/dL (ref 30.0–36.0)
MCV: 82.3 fL (ref 78.0–100.0)
Platelets: 265 10*3/uL (ref 150–400)
RBC: 3.85 MIL/uL — ABNORMAL LOW (ref 4.22–5.81)
RDW: 14.9 % (ref 11.5–15.5)
WBC: 10.2 10*3/uL (ref 4.0–10.5)

## 2017-05-07 LAB — HEMOGLOBIN A1C
HEMOGLOBIN A1C: 6.3 % — AB (ref 4.8–5.6)
Mean Plasma Glucose: 134.11 mg/dL

## 2017-05-07 LAB — PROTIME-INR
INR: 1.04
Prothrombin Time: 13.5 seconds (ref 11.4–15.2)

## 2017-05-07 LAB — POCT ACTIVATED CLOTTING TIME: ACTIVATED CLOTTING TIME: 378 s

## 2017-05-07 SURGERY — LEFT HEART CATH AND CORONARY ANGIOGRAPHY
Anesthesia: LOCAL

## 2017-05-07 MED ORDER — CLOPIDOGREL BISULFATE 300 MG PO TABS
ORAL_TABLET | ORAL | Status: DC | PRN
  Administered 2017-05-07: 300 mg via ORAL

## 2017-05-07 MED ORDER — LIDOCAINE HCL (PF) 1 % IJ SOLN
INTRAMUSCULAR | Status: DC | PRN
  Administered 2017-05-07: 25 mL

## 2017-05-07 MED ORDER — ACETAMINOPHEN 500 MG PO TABS: 500.0000 mg | ORAL_TABLET | Freq: Four times a day (QID) | ORAL | Status: DC | PRN

## 2017-05-07 MED ORDER — DEXTROSE 5 % IV SOLN
INTRAVENOUS | Status: AC | PRN
  Administered 2017-05-07: 50 mL/h via INTRAVENOUS

## 2017-05-07 MED ORDER — CLOPIDOGREL BISULFATE 300 MG PO TABS
ORAL_TABLET | ORAL | Status: AC
  Filled 2017-05-07: qty 1

## 2017-05-07 MED ORDER — MIDAZOLAM HCL 2 MG/2ML IJ SOLN
INTRAMUSCULAR | Status: DC | PRN
  Administered 2017-05-07: 1 mg via INTRAVENOUS

## 2017-05-07 MED ORDER — SODIUM CHLORIDE 0.9% FLUSH: 3.0000 mL | INTRAVENOUS | Status: DC | PRN

## 2017-05-07 MED ORDER — HEPARIN (PORCINE) IN NACL 2-0.9 UNIT/ML-% IJ SOLN
INTRAMUSCULAR | Status: AC
  Filled 2017-05-07: qty 1000

## 2017-05-07 MED ORDER — CLOPIDOGREL BISULFATE 75 MG PO TABS
75.0000 mg | ORAL_TABLET | Freq: Every day | ORAL | Status: DC
  Administered 2017-05-08: 11:00:00 75 mg via ORAL
  Filled 2017-05-07: qty 1

## 2017-05-07 MED ORDER — LIDOCAINE HCL (PF) 1 % IJ SOLN
INTRAMUSCULAR | Status: AC
  Filled 2017-05-07: qty 30

## 2017-05-07 MED ORDER — SODIUM CHLORIDE 0.9 % IV SOLN
INTRAVENOUS | Status: AC
  Administered 2017-05-07 – 2017-05-08 (×2): via INTRAVENOUS

## 2017-05-07 MED ORDER — SODIUM CHLORIDE 0.9% FLUSH
3.0000 mL | Freq: Two times a day (BID) | INTRAVENOUS | Status: DC
  Administered 2017-05-07 – 2017-05-08 (×2): 3 mL via INTRAVENOUS

## 2017-05-07 MED ORDER — EZETIMIBE 10 MG PO TABS
10.0000 mg | ORAL_TABLET | Freq: Every day | ORAL | Status: DC
  Administered 2017-05-07 – 2017-05-08 (×2): 10 mg via ORAL
  Filled 2017-05-07 (×2): qty 1

## 2017-05-07 MED ORDER — SODIUM CHLORIDE 0.9% FLUSH: 3.0000 mL | Freq: Two times a day (BID) | INTRAVENOUS | Status: DC

## 2017-05-07 MED ORDER — ALBUTEROL SULFATE (2.5 MG/3ML) 0.083% IN NEBU: 3.0000 mL | INHALATION_SOLUTION | RESPIRATORY_TRACT | Status: DC | PRN

## 2017-05-07 MED ORDER — FENTANYL CITRATE (PF) 100 MCG/2ML IJ SOLN
INTRAMUSCULAR | Status: AC
  Filled 2017-05-07: qty 2

## 2017-05-07 MED ORDER — MIDAZOLAM HCL 2 MG/2ML IJ SOLN
INTRAMUSCULAR | Status: AC
  Filled 2017-05-07: qty 2

## 2017-05-07 MED ORDER — SODIUM CHLORIDE 0.9 % WEIGHT BASED INFUSION
1.0000 mL/kg/h | INTRAVENOUS | Status: DC
  Administered 2017-05-07: 1 mL/kg/h via INTRAVENOUS

## 2017-05-07 MED ORDER — ASPIRIN 81 MG PO CHEW
81.0000 mg | CHEWABLE_TABLET | Freq: Every day | ORAL | Status: DC
  Administered 2017-05-08: 11:00:00 81 mg via ORAL
  Filled 2017-05-07: qty 1

## 2017-05-07 MED ORDER — ASPIRIN 81 MG PO CHEW
81.0000 mg | CHEWABLE_TABLET | ORAL | Status: AC
  Administered 2017-05-07: 81 mg via ORAL

## 2017-05-07 MED ORDER — CLOPIDOGREL BISULFATE 75 MG PO TABS
ORAL_TABLET | ORAL | Status: AC
  Administered 2017-05-07: 75 mg via ORAL
  Filled 2017-05-07: qty 1

## 2017-05-07 MED ORDER — SODIUM CHLORIDE 0.9 % IV SOLN: 250.0000 mL | INTRAVENOUS | Status: DC | PRN

## 2017-05-07 MED ORDER — PRAVASTATIN SODIUM 80 MG PO TABS
80.0000 mg | ORAL_TABLET | Freq: Every day | ORAL | Status: DC
  Administered 2017-05-08: 11:00:00 80 mg via ORAL
  Filled 2017-05-07 (×2): qty 1

## 2017-05-07 MED ORDER — NITROGLYCERIN IN D5W 200-5 MCG/ML-% IV SOLN
5.0000 ug/min | INTRAVENOUS | Status: DC
  Administered 2017-05-07: 5 ug/min via INTRAVENOUS
  Filled 2017-05-07: qty 250

## 2017-05-07 MED ORDER — BIVALIRUDIN BOLUS VIA INFUSION - CUPID
INTRAVENOUS | Status: DC | PRN
  Administered 2017-05-07: 86.775 mg via INTRAVENOUS

## 2017-05-07 MED ORDER — NITROGLYCERIN 1 MG/10 ML FOR IR/CATH LAB
INTRA_ARTERIAL | Status: DC | PRN
  Administered 2017-05-07 (×2): 150 ug via INTRACORONARY

## 2017-05-07 MED ORDER — ONDANSETRON HCL 4 MG/2ML IJ SOLN
4.0000 mg | Freq: Four times a day (QID) | INTRAMUSCULAR | Status: DC | PRN
  Administered 2017-05-07: 4 mg via INTRAVENOUS
  Filled 2017-05-07: qty 2

## 2017-05-07 MED ORDER — PANTOPRAZOLE SODIUM 40 MG PO TBEC
40.0000 mg | DELAYED_RELEASE_TABLET | Freq: Every day | ORAL | Status: DC
  Administered 2017-05-08: 11:00:00 40 mg via ORAL
  Filled 2017-05-07: qty 1

## 2017-05-07 MED ORDER — IOPAMIDOL (ISOVUE-370) INJECTION 76%
INTRAVENOUS | Status: DC | PRN
  Administered 2017-05-07: 410 mL via INTRA_ARTERIAL

## 2017-05-07 MED ORDER — NITROGLYCERIN 1 MG/10 ML FOR IR/CATH LAB
INTRA_ARTERIAL | Status: AC
  Filled 2017-05-07: qty 10

## 2017-05-07 MED ORDER — INSULIN ASPART 100 UNIT/ML ~~LOC~~ SOLN: 0.0000 [IU] | Freq: Three times a day (TID) | SUBCUTANEOUS | Status: DC

## 2017-05-07 MED ORDER — FENTANYL CITRATE (PF) 100 MCG/2ML IJ SOLN
INTRAMUSCULAR | Status: DC | PRN
  Administered 2017-05-07 (×4): 25 ug via INTRAVENOUS

## 2017-05-07 MED ORDER — BIVALIRUDIN TRIFLUOROACETATE 250 MG IV SOLR
INTRAVENOUS | Status: AC
  Filled 2017-05-07: qty 250

## 2017-05-07 MED ORDER — SODIUM CHLORIDE 0.9 % IV SOLN
INTRAVENOUS | Status: DC | PRN
  Administered 2017-05-07 (×3): 1.75 mg/kg/h via INTRAVENOUS

## 2017-05-07 MED ORDER — HEPARIN (PORCINE) IN NACL 2-0.9 UNIT/ML-% IJ SOLN
INTRAMUSCULAR | Status: AC | PRN
  Administered 2017-05-07: 1000 mL

## 2017-05-07 MED ORDER — IOPAMIDOL (ISOVUE-370) INJECTION 76%
INTRAVENOUS | Status: AC
  Filled 2017-05-07: qty 100

## 2017-05-07 MED ORDER — IOPAMIDOL (ISOVUE-370) INJECTION 76%
INTRAVENOUS | Status: AC
  Filled 2017-05-07: qty 200

## 2017-05-07 MED ORDER — SODIUM CHLORIDE 0.9 % WEIGHT BASED INFUSION: 3.0000 mL/kg/h | INTRAVENOUS | Status: DC

## 2017-05-07 MED ORDER — HEPARIN (PORCINE) IN NACL 2-0.9 UNIT/ML-% IJ SOLN
INTRAMUSCULAR | Status: AC | PRN
  Administered 2017-05-07: 500 mL

## 2017-05-07 MED ORDER — FENTANYL CITRATE (PF) 100 MCG/2ML IJ SOLN
INTRAMUSCULAR | Status: DC | PRN
  Administered 2017-05-07: 25 ug via INTRAVENOUS

## 2017-05-07 MED ORDER — IOPAMIDOL (ISOVUE-370) INJECTION 76%
INTRAVENOUS | Status: AC
  Filled 2017-05-07: qty 50

## 2017-05-07 MED ORDER — CLOPIDOGREL BISULFATE 75 MG PO TABS
75.0000 mg | ORAL_TABLET | ORAL | Status: AC
  Administered 2017-05-07: 75 mg via ORAL

## 2017-05-07 MED ORDER — METOPROLOL TARTRATE 25 MG PO TABS
25.0000 mg | ORAL_TABLET | Freq: Two times a day (BID) | ORAL | Status: DC
  Administered 2017-05-07 – 2017-05-08 (×2): 25 mg via ORAL
  Filled 2017-05-07 (×2): qty 1

## 2017-05-07 MED ORDER — ASPIRIN 81 MG PO CHEW
CHEWABLE_TABLET | ORAL | Status: AC
  Administered 2017-05-07: 81 mg via ORAL
  Filled 2017-05-07: qty 1

## 2017-05-07 MED ORDER — SENNA 8.6 MG PO TABS
1.0000 | ORAL_TABLET | Freq: Every day | ORAL | Status: DC | PRN
  Filled 2017-05-07: qty 2

## 2017-05-07 MED ORDER — LEVOTHYROXINE SODIUM 88 MCG PO TABS
88.0000 ug | ORAL_TABLET | Freq: Every day | ORAL | Status: DC
  Filled 2017-05-07: qty 1

## 2017-05-07 SURGICAL SUPPLY — 34 items
BALLN EMERGE MR 2.0X15 (BALLOONS) ×4
BALLN EMERGE MR 2.5X15 (BALLOONS) ×4
BALLN EMERGE MR 2.5X20 (BALLOONS) ×2
BALLN SAPPHIRE ~~LOC~~ 3.0X15 (BALLOONS) ×1 IMPLANT
BALLN ~~LOC~~ EMERGE MR 2.75X20 (BALLOONS) ×2
BALLN ~~LOC~~ EMERGE MR 3.0X20 (BALLOONS) ×2
BALLN ~~LOC~~ EMERGE MR 3.0X8 (BALLOONS) ×2
BALLN ~~LOC~~ EMERGE MR 3.25X15 (BALLOONS) ×4
BALLN ~~LOC~~ EMERGE MR 3.25X8 (BALLOONS) ×2
BALLOON EMERGE MR 2.0X15 (BALLOONS) IMPLANT
BALLOON EMERGE MR 2.5X15 (BALLOONS) IMPLANT
BALLOON EMERGE MR 2.5X20 (BALLOONS) IMPLANT
BALLOON ~~LOC~~ EMERGE MR 2.75X20 (BALLOONS) IMPLANT
BALLOON ~~LOC~~ EMERGE MR 3.0X20 (BALLOONS) IMPLANT
BALLOON ~~LOC~~ EMERGE MR 3.0X8 (BALLOONS) IMPLANT
BALLOON ~~LOC~~ EMERGE MR 3.25X15 (BALLOONS) IMPLANT
BALLOON ~~LOC~~ EMERGE MR 3.25X8 (BALLOONS) IMPLANT
CATH INFINITI 5FR JL5 (CATHETERS) ×1 IMPLANT
CATH INFINITI 5FR MULTPACK ANG (CATHETERS) ×1 IMPLANT
CATH LAUNCHER 6FR EBU3.5 (CATHETERS) ×1 IMPLANT
CATH VISTA GUIDE 6FR XB3.5 (CATHETERS) ×1 IMPLANT
CATH VISTA GUIDE 6FR XB4 (CATHETERS) ×1 IMPLANT
GUIDELINER 6F (CATHETERS) ×1 IMPLANT
HOVERMATT SINGLE USE (MISCELLANEOUS) ×1 IMPLANT
KIT ENCORE 26 ADVANTAGE (KITS) ×1 IMPLANT
KIT HEART LEFT (KITS) ×2 IMPLANT
PACK CARDIAC CATHETERIZATION (CUSTOM PROCEDURE TRAY) ×2 IMPLANT
SHEATH PINNACLE 5F 10CM (SHEATH) ×1 IMPLANT
SHEATH PINNACLE 6F 10CM (SHEATH) ×1 IMPLANT
STENT SIERRA 3.00 X 23 MM (Permanent Stent) ×1 IMPLANT
TRANSDUCER W/STOPCOCK (MISCELLANEOUS) ×2 IMPLANT
WIRE EMERALD 3MM-J .035X150CM (WIRE) ×1 IMPLANT
WIRE MAILMAN 182CM (WIRE) ×1 IMPLANT
WIRE RUNTHROUGH .014X180CM (WIRE) ×1 IMPLANT

## 2017-05-07 NOTE — Interval H&P Note (Signed)
Cath Lab Visit (complete for each Cath Lab visit)  Clinical Evaluation Leading to the Procedure:   ACS: No.  Non-ACS:    Anginal Classification: CCS III  Anti-ischemic medical therapy: Maximal Therapy (2 or more classes of medications)  Non-Invasive Test Results: High-risk stress test findings: cardiac mortality >3%/year  Prior CABG: No previous CABG      History and Physical Interval Note:  05/07/2017 11:53 AM  Harlow Mares  has presented today for surgery, with the diagnosis of abnormal stress test - cp  The various methods of treatment have been discussed with the patient and family. After consideration of risks, benefits and other options for treatment, the patient has consented to  Procedure(s): LEFT HEART CATH AND CORONARY ANGIOGRAPHY (N/A) as a surgical intervention .  The patient's history has been reviewed, patient examined, no change in status, stable for surgery.  I have reviewed the patient's chart and labs.  Questions were answered to the patient's satisfaction.     Charolette Forward

## 2017-05-07 NOTE — H&P (Signed)
The printed H&P in the chart needs to be scanned 

## 2017-05-07 NOTE — Progress Notes (Signed)
Site area: right groin  Site Prior to Removal:  Level 0  Pressure Applied For 20 MINUTES    Minutes Beginning at 1810  Manual:   Yes.    Patient Status During Pull:  stable  Post Pull Groin Site:  Level 0  Post Pull Instructions Given:  Yes.    Post Pull Pulses Present:  Yes.    Dressing Applied:  Yes.    Comments:  Bedrest started at 57

## 2017-05-08 ENCOUNTER — Encounter (HOSPITAL_COMMUNITY): Payer: Self-pay | Admitting: Cardiology

## 2017-05-08 DIAGNOSIS — Z8673 Personal history of transient ischemic attack (TIA), and cerebral infarction without residual deficits: Secondary | ICD-10-CM | POA: Diagnosis not present

## 2017-05-08 DIAGNOSIS — I639 Cerebral infarction, unspecified: Secondary | ICD-10-CM | POA: Diagnosis not present

## 2017-05-08 DIAGNOSIS — G4733 Obstructive sleep apnea (adult) (pediatric): Secondary | ICD-10-CM | POA: Diagnosis not present

## 2017-05-08 DIAGNOSIS — D649 Anemia, unspecified: Secondary | ICD-10-CM | POA: Diagnosis not present

## 2017-05-08 DIAGNOSIS — K219 Gastro-esophageal reflux disease without esophagitis: Secondary | ICD-10-CM | POA: Diagnosis not present

## 2017-05-08 DIAGNOSIS — I110 Hypertensive heart disease with heart failure: Secondary | ICD-10-CM | POA: Diagnosis not present

## 2017-05-08 DIAGNOSIS — I252 Old myocardial infarction: Secondary | ICD-10-CM | POA: Diagnosis not present

## 2017-05-08 DIAGNOSIS — E785 Hyperlipidemia, unspecified: Secondary | ICD-10-CM | POA: Diagnosis not present

## 2017-05-08 DIAGNOSIS — E119 Type 2 diabetes mellitus without complications: Secondary | ICD-10-CM | POA: Diagnosis not present

## 2017-05-08 DIAGNOSIS — I25119 Atherosclerotic heart disease of native coronary artery with unspecified angina pectoris: Secondary | ICD-10-CM | POA: Diagnosis not present

## 2017-05-08 DIAGNOSIS — I5030 Unspecified diastolic (congestive) heart failure: Secondary | ICD-10-CM | POA: Diagnosis not present

## 2017-05-08 DIAGNOSIS — J441 Chronic obstructive pulmonary disease with (acute) exacerbation: Secondary | ICD-10-CM | POA: Diagnosis not present

## 2017-05-08 LAB — CBC
HEMATOCRIT: 27.8 % — AB (ref 39.0–52.0)
HEMOGLOBIN: 8.9 g/dL — AB (ref 13.0–17.0)
MCH: 26.9 pg (ref 26.0–34.0)
MCHC: 32 g/dL (ref 30.0–36.0)
MCV: 84 fL (ref 78.0–100.0)
Platelets: 256 10*3/uL (ref 150–400)
RBC: 3.31 MIL/uL — AB (ref 4.22–5.81)
RDW: 15.4 % (ref 11.5–15.5)
WBC: 11.2 10*3/uL — AB (ref 4.0–10.5)

## 2017-05-08 LAB — BASIC METABOLIC PANEL
ANION GAP: 6 (ref 5–15)
BUN: 14 mg/dL (ref 6–20)
CALCIUM: 8.5 mg/dL — AB (ref 8.9–10.3)
CO2: 26 mmol/L (ref 22–32)
Chloride: 102 mmol/L (ref 101–111)
Creatinine, Ser: 1.18 mg/dL (ref 0.61–1.24)
GFR, EST NON AFRICAN AMERICAN: 59 mL/min — AB (ref >60)
GLUCOSE: 102 mg/dL — AB (ref 65–99)
POTASSIUM: 4.3 mmol/L (ref 3.5–5.1)
Sodium: 134 mmol/L — ABNORMAL LOW (ref 135–145)

## 2017-05-08 LAB — GLUCOSE, CAPILLARY
GLUCOSE-CAPILLARY: 118 mg/dL — AB (ref 65–99)
GLUCOSE-CAPILLARY: 105 mg/dL — AB (ref 65–99)
Glucose-Capillary: 105 mg/dL — ABNORMAL HIGH (ref 65–99)

## 2017-05-08 MED ORDER — TIOTROPIUM BROMIDE MONOHYDRATE 18 MCG IN CAPS: 18.0000 ug | ORAL_CAPSULE | Freq: Every day | RESPIRATORY_TRACT | 2 refills | Status: DC

## 2017-05-08 MED ORDER — FERROUS SULFATE 325 (65 FE) MG PO TABS
325.0000 mg | ORAL_TABLET | Freq: Three times a day (TID) | ORAL | 3 refills | Status: DC
Start: 2017-05-08 — End: 2019-08-27

## 2017-05-08 MED ORDER — INSULIN NPH ISOPHANE & REGULAR (70-30) 100 UNIT/ML ~~LOC~~ SUSP: SUBCUTANEOUS | 0 refills | Status: DC

## 2017-05-08 MED ORDER — FERROUS SULFATE 325 (65 FE) MG PO TABS
325.0000 mg | ORAL_TABLET | Freq: Three times a day (TID) | ORAL | Status: DC
  Administered 2017-05-08: 13:00:00 325 mg via ORAL
  Filled 2017-05-08: qty 1

## 2017-05-08 MED ORDER — NITROGLYCERIN 0.4 MG SL SUBL: 0.4000 mg | SUBLINGUAL_TABLET | SUBLINGUAL | 3 refills | Status: AC | PRN

## 2017-05-08 MED ORDER — LEVETIRACETAM 500 MG PO TABS
500.0000 mg | ORAL_TABLET | Freq: Every day | ORAL | Status: DC
  Administered 2017-05-08: 500 mg via ORAL
  Filled 2017-05-08 (×2): qty 1

## 2017-05-08 MED ORDER — ANGIOPLASTY BOOK
Freq: Once | Status: AC
  Administered 2017-05-08: 05:00:00
  Filled 2017-05-08: qty 1

## 2017-05-08 NOTE — Progress Notes (Signed)
Patient set up on CPAP Auto Mode and tolerating well. Patient has a 2L O2 bleed in and will call if any further assistance needed.

## 2017-05-08 NOTE — Discharge Instructions (Signed)
Coronary Angiogram With Stent °Coronary angiogram with stent placement is a procedure to widen or open a narrow blood vessel of the heart (coronary artery). Arteries may become blocked by cholesterol buildup (plaques) in the lining of the wall. When a coronary artery becomes partially blocked, blood flow to that area decreases. This may lead to chest pain or a heart attack (myocardial infarction). °A stent is a small piece of metal that looks like mesh or a spring. Stent placement may be done as treatment for a heart attack or right after a coronary angiogram in which a blocked artery is found. °Let your health care provider know about: °· Any allergies you have. °· All medicines you are taking, including vitamins, herbs, eye drops, creams, and over-the-counter medicines. °· Any problems you or family members have had with anesthetic medicines. °· Any blood disorders you have. °· Any surgeries you have had. °· Any medical conditions you have. °· Whether you are pregnant or may be pregnant. °What are the risks? °Generally, this is a safe procedure. However, problems may occur, including: °· Damage to the heart or its blood vessels. °· A return of blockage. °· Bleeding, infection, or bruising at the insertion site. °· A collection of blood under the skin (hematoma) at the insertion site. °· A blood clot in another part of the body. °· Kidney injury. °· Allergic reaction to the dye or contrast that is used. °· Bleeding into the abdomen (retroperitoneal bleeding). ° °What happens before the procedure? °Staying hydrated °Follow instructions from your health care provider about hydration, which may include: °· Up to 2 hours before the procedure - you may continue to drink clear liquids, such as water, clear fruit juice, black coffee, and plain tea. ° °Eating and drinking restrictions °Follow instructions from your health care provider about eating and drinking, which may include: °· 8 hours before the procedure - stop  eating heavy meals or foods such as meat, fried foods, or fatty foods. °· 6 hours before the procedure - stop eating light meals or foods, such as toast or cereal. °· 2 hours before the procedure - stop drinking clear liquids. ° °Ask your health care provider about: °· Changing or stopping your regular medicines. This is especially important if you are taking diabetes medicines or blood thinners. °· Taking medicines such as ibuprofen. These medicines can thin your blood. Do not take these medicines before your procedure if your health care provider instructs you not to. Generally, aspirin is recommended before a procedure of passing a small, thin tube (catheter) through a blood vessel and into the heart (cardiac catheterization). ° °What happens during the procedure? °· An IV tube will be inserted into one of your veins. °· You will be given one or more of the following: °? A medicine to help you relax (sedative). °? A medicine to numb the area where the catheter will be inserted into an artery (local anesthetic). °· To reduce your risk of infection: °? Your health care team will wash or sanitize their hands. °? Your skin will be washed with soap. °? Hair may be removed from the area where the catheter will be inserted. °· Using a guide wire, the catheter will be inserted into an artery. The location may be in your groin, in your wrist, or in the fold of your arm (near your elbow). °· A type of X-ray (fluoroscopy) will be used to help guide the catheter to the opening of the arteries in the heart. °·   A dye will be injected into the catheter, and X-rays will be taken. The dye will help to show where any narrowing or blockages are located in the arteries. °· A tiny wire will be guided to the blocked spot, and a balloon will be inflated to make the artery wider. °· The stent will be expanded and will crush the plaques into the wall of the vessel. The stent will hold the area open and improve the blood flow. Most stents  have a drug coating to reduce the risk of the stent narrowing over time. °· The artery may be made wider using a drill, laser, or other tools to remove plaques. °· When the blood flow is better, the catheter will be removed. The lining of the artery will grow over the stent, which stays where it was placed. °This procedure may vary among health care providers and hospitals. °What happens after the procedure? °· If the procedure is done through the leg, you will be kept in bed lying flat for about 6 hours. You will be instructed to not bend and not cross your legs. °· The insertion site will be checked frequently. °· The pulse in your foot or wrist will be checked frequently. °· You may have additional blood tests, X-rays, and a test that records the electrical activity of your heart (electrocardiogram, or ECG). °This information is not intended to replace advice given to you by your health care provider. Make sure you discuss any questions you have with your health care provider. °Document Released: 11/09/2002 Document Revised: 01/03/2016 Document Reviewed: 12/09/2015 °Elsevier Interactive Patient Education © 2018 Elsevier Inc. ° °

## 2017-05-08 NOTE — Care Management Note (Signed)
Case Management Note  Patient Details  Name: [name] MRN: [id number] Date of Birth: 11-Dec-1940  Subjective/Objective:   From home, s/p coronary stent intervention, will be on plavix, patient will also need home oxygen, patient chose [facility], referral made to [facility] with [facility].                   Action/Plan: DC home with home oxygen.   Expected Discharge Date:  05/08/17               Expected Discharge Plan:  Home/Self Care  In-House Referral:     Discharge planning Services  CM Consult  Post Acute Care Choice:  Durable Medical Equipment Choice offered to:  Patient  DME Arranged:  Oxygen DME Agency:  [place]:    [facility] Agency:     Status of Service:  Completed, signed off  If discussed at [place] of Stay Meetings, dates discussed:    Additional Comments:  Zenon Mayo, RN 05/08/2017, 2:37 PM

## 2017-05-08 NOTE — Progress Notes (Addendum)
SATURATION QUALIFICATIONS: (This note is used to comply with regulatory documentation for home oxygen)  Patient Saturations on Room Air at Rest = 85%  Patient Saturations on Room Air while Ambulating = 87%  Patient Saturations on 2  Liters of oxygen while Ambulating = 90%  Please briefly explain why patient needs home oxygen:  COPD with SOB.

## 2017-05-08 NOTE — Discharge Summary (Signed)
NAMEKRYSTAL, [name]              ACCOUNT NO.:  [id number]  MEDICAL RECORD NO.:  [id number]  LOCATION:                                 FACILITY:  PHYSICIAN:  Laruth Hanger N. Terrence Dupont, M.D.      DATE OF BIRTH:  DATE OF ADMISSION:  05/07/2017 DATE OF DISCHARGE:  05/08/2017                              DISCHARGE SUMMARY   ADMITTING DIAGNOSES: 1. New onset angina, abnormal nuclear stress test, rule out     progression of coronary artery disease. 2. Diastolic congestive heart failure. 3. Multivessel coronary artery disease, history of inferior wall     myocardial infarction in the past, status post percutaneous     coronary intervention to right coronary artery and left anterior     descending in the past. 4. Type 2 diabetes mellitus. 5. Gastroesophageal reflux disease. 6. Hypertension. 7. Hyperlipidemia. 8. Morbid obesity. 9. Left-sided cerebrovascular accident. 10.Gouty arthritis. 11.Anemia of chronic disease.  DISCHARGE DIAGNOSES: 1. Stable angina, status post left cardiac catheterization and     percutaneous transluminal coronary angioplasty stenting to left     circumflex and percutaneous transluminal coronary angioplasty to     obtuse marginal 2 as per procedure report. 2. Multivessel coronary artery disease, status post inferior wall     myocardial infarction in remote past, status post percutaneous     coronary intervention to right coronary artery and left anterior     descending in the past. 3. Compensated diastolic congestive heart failure. 4. Type 2 diabetes mellitus. 5. Gastroesophageal reflux disease. 6. Hypertension. 7. Hyperlipidemia. 8. Morbid obesity. 9. Left cerebrovascular accident. 10.Gouty arthritis. 11.Anemia of chronic disease.  DISCHARGE HOME MEDICATIONS: 1. Nitrostat 0.4 mg sublingual use as directed. 2. Spiriva inhaler 1 puff every day. 3. Tylenol 500 mg every 6 hours as needed. 4. Albuterol inhaler 2 puffs every 4 hours as needed. 5. Aspirin 81 mg  daily. 6. Clopidogrel 75 mg daily. 7. Vitamin D2 one capsule 50,000 units once every weekly. 8. Zetia 10 mg daily. 9. Keppra 500 mg daily. 10.Levothyroxine 88 mcg daily. 11.Lisinopril 40 mg daily. 12.Meclizine 25 mg three times daily as needed. 13.Metoprolol tartrate 50 mg half tablet twice daily. 14.Pravastatin 80 mg daily. 15.Senokot 1-2 tablets daily as needed for constipation. 16.Spironolactone 25 mg daily. 17.Vitamin B12 1000 mcg daily. 18.Allopurinol 300 mg daily. 19.Novolin 70/30 insulin 20 units twice daily. 20.Pantoprazole 40 mg daily. 21.Kenalog cream apply locally as before. The patient has been advised to stop 1. Amlodipine. 2. Metformin. 3. Spiriva Respimat.  DISCHARGE INSTRUCTIONS:  The patient has been advised to monitor blood pressure and blood sugar daily and chart increase insulin accordingly with sliding scale as before.  Also O2 by nasal cannula 2 L/minute, O2 sats on room air by 85%.  Feosol 325 mg one tablet twice daily. Condition at discharge is stable.  Follow up with me in 1 week post PTCA stent instructions have been given.  BRIEF HISTORY AND HOSPITAL COURSE:  Mr. [name] is 76 year old male with past medical history significant for multivessel coronary artery disease, history of inferior wall myocardial infarction in remote past, status post PCI to RCA and LAD in the past, hypertension, diabetes mellitus, hyperlipidemia, history  of CVA, history of diastolic congestive heart failure, morbid obesity, history of remote tobacco abuse.  He came to the office complaining of retrosternal chest burning off and on associated with exertional dyspnea with minimal exertion. Denies any nausea, vomiting, or diaphoresis.  Denies palpitation, lightheadedness, or syncope.  EKG done in the office showed normal sinus rhythm with first-degree AV block and nonspecific T-wave changes.  The patient subsequently underwent Lexiscan Myoview on April 20, 2017, which  showed moderate reversible ischemia in lateral wall with LV dilatation, EF of 50%, noninvasive risk stratification high.  DISCHARGE PHYSICAL EXAMINATION:  GENERAL:  He was alert, awake, oriented x3, in no acute distress. VITAL SIGNS:  Blood pressure was 137/72, pulse 73 and regular. EYES:  Conjunctivae was pink. NECK:  Supple.  No JVD.  No bruit. LUNGS:  Clear to auscultation without rhonchi or rales. CARDIOVASCULAR:  S1, S2 was normal.  There was soft systolic murmur. ABDOMEN:  Soft, obese, nontender. EXTREMITIES:  There is no clubbing, cyanosis, and trace edema was noted.  LABORATORY DATA:  Sodium was 137, potassium 4.1, BUN 19, and creatinine 1.25.  Hemoglobin was 10.1, hematocrit 31.7, and white count of 10.2. Post procedure this a.m. labs; sodium is 134, potassium 4.3, BUN 14, and creatinine 1.18.  Hemoglobin has dropped to 8.9, hematocrit 27.8, and white count of 11.2.  EKG showed normal sinus rhythm with no acute ischemic changes.  His hemoglobin A1c was 6.3.  BRIEF HOSPITAL COURSE:  The patient was a.m. admit and underwent left cardiac catheterization and PTCA stenting to left circumflex and PTCA to obtuse marginal 2 as per procedure report.  The patient tolerated the procedure well.  There were no complications.  Postprocedure, the patient did not have any episodes of chest pain during the hospital stay.  The patient had calcific lesion in mid circumflex requiring multiple balloon exchanges and GuideLiner and prolonged fluoro time and contrast.  The patient did lose more than 150 mL of blood during the prolonged procedure.  His hemoglobin drop is due to hydration and blood loss during the procedure.  The patient groin is stable with no evidence of hematoma or bruit.  The patient will be discharged home on above medications and will be followed up in my office in 1 week and will be scheduled for phase-2 cardiac rehab as outpatient.     [name]. Terrence Dupont,  M.D.     MNH/MEDQ  D:  05/08/2017  T:  05/08/2017  Job:  [id number]

## 2017-05-08 NOTE — Discharge Summary (Signed)
Discharge summary dictated on 05/08/2017.  Dictation number is [id number]

## 2017-05-08 NOTE — Progress Notes (Signed)
CARDIAC REHAB PHASE I   PRE:  Rate/Rhythm: 78 SR    BP: sitting 149/59    SaO2: 92 2L, 85 RA EOB  MODE:  Ambulation: 200 ft   POST:  Rate/Rhythm: 95 SR    BP: sitting 169/70     SaO2: 89 2L  Pt needed assist to get to EOB. Pt sts he is more able at home with home surroundings and sometimes wife. On EOB SaO2 85 RA, only increased to 87 RA with pursed lip breathing. Used 2L O2 walking. Denied c/o, Highest Sao2 92 2L while walking. 89 2L after walk in recliner. Pt will need O2, RN put in qualifying note. Of note, pt sts he had O2 at home >2 years ago but his copay increased so he took his O2 back and has been ok with albuterol and the ceiling fan.  Ed completed with pt, good reception. Understands needs for Plavix/ASA. Interested in [facility] and will send referral to [facility]. Pt can't walk far but hopefully can do machines better.   [place], ACSM 05/08/2017 9:55 AM

## 2017-05-11 ENCOUNTER — Other Ambulatory Visit: Payer: Self-pay

## 2017-05-11 NOTE — Patient Outreach (Signed)
[facility]) Care Management  [place]  05/11/2017   [name] 1940-08-07 [id number]  Subjective: Telephone call to patient for follow up after stent placement.  Patient reports he is doing fine. He states he has a follow up with physician on Friday. Asked patient about stent site.  He reports the area is fine with no swelling or drainage.  Patient unable to review his medications on call today. Patient does express some concern about not being able to afford his Spiriva inhaler.  He states he will be talking the his physician about it as it cost $138.00.  Discussed with patient having pharmacist to contact him about possible options for helping him afford medication.  He is agreeable.    Patient offers no other concerns on call.   Objective:   Encounter Medications:  Outpatient Encounter Medications as of 05/11/2017  Medication Sig Note  . acetaminophen (TYLENOL) 500 MG tablet Take 500 mg by mouth every 6 (six) hours as needed for moderate pain (left thigh).   Marland Kitchen albuterol (PROVENTIL HFA;VENTOLIN HFA) 108 (90 Base) MCG/ACT inhaler Inhale 2 puffs into the lungs every 4 (four) hours as needed for wheezing or shortness of breath.   . allopurinol (ZYLOPRIM) 300 MG tablet TAKE 1 TABLET ONE TIME DAILY (Patient taking differently: TAKE 300 mg TABLET ONE TIME DAILY)   . aspirin EC 81 MG tablet Take 81 mg by mouth daily.   . clopidogrel (PLAVIX) 75 MG tablet Take 1 tablet (75 mg total) by mouth daily.   . ergocalciferol (VITAMIN D2) 50000 units capsule Take 1 capsule (50,000 Units total) by mouth once a week. One capsule once weekly   . ezetimibe (ZETIA) 10 MG tablet Take 1 tablet (10 mg total) by mouth daily.   . ferrous sulfate 325 (65 FE) MG tablet Take 1 tablet (325 mg total) by mouth 3 (three) times daily with meals.   . insulin NPH-regular Human (NOVOLIN 70/30 RELION) (70-30) 100 UNIT/ML injection INJECT20 UNITS INTO SKIN WITH BREAKFAST AND SUPPER WHEN GLUCOSE ID  ABOVE 90   . levETIRAcetam (KEPPRA) 500 MG tablet Take 500 mg by mouth daily.   Marland Kitchen levothyroxine (SYNTHROID, LEVOTHROID) 88 MCG tablet Take 1 tablet (88 mcg total) by mouth every morning.   Marland Kitchen lisinopril (PRINIVIL,ZESTRIL) 40 MG tablet Take 1 tablet (40 mg total) by mouth daily.   . meclizine (ANTIVERT) 25 MG tablet Take 1 tablet (25 mg total) by mouth 3 (three) times daily as needed for dizziness.   . metoprolol tartrate (LOPRESSOR) 50 MG tablet Take 0.5 tablets (25 mg total) by mouth 2 (two) times daily.   . nitroGLYCERIN (NITROSTAT) 0.4 MG SL tablet Place 1 tablet (0.4 mg total) under the tongue every 5 (five) minutes as needed for chest pain.   . pantoprazole (PROTONIX) 40 MG tablet TAKE 1 TABLET EVERY DAY BEFORE BREAKFAST (Patient taking differently: Take 40 mg by mouth daily. TAKE 1 TABLET EVERY DAY BEFORE BREAKFAST)   . pravastatin (PRAVACHOL) 40 MG tablet Take 2 tablets (80 mg total) by mouth daily.   Marland Kitchen senna (SENOKOT) 8.6 MG tablet Take 1-2 tablets by mouth daily as needed for constipation.    Marland Kitchen spironolactone (ALDACTONE) 25 MG tablet Take 1 tablet (25 mg total) by mouth daily.   Marland Kitchen tiotropium (SPIRIVA HANDIHALER) 18 MCG inhalation capsule Place 1 capsule (18 mcg total) into inhaler and inhale daily.   Marland Kitchen triamcinolone cream (KENALOG) 0.1 % Apply 1 application topically 2 (two) times daily. Apply twice  daily to rash on right elbow  , under a moist wrap for 4 weeks (Patient taking differently: Apply 1 application topically 2 (two) times daily as needed (for affected area of skin on elbow). Apply twice daily to rash on right elbow  , under a moist wrap for 4 weeks) 04/18/2016: As needed  . vitamin B-12 (CYANOCOBALAMIN) 1000 MCG tablet Take 1,000 mcg by mouth daily.    No facility-administered encounter medications on file as of 05/11/2017.     Functional Status:  In your present state of health, do you have any difficulty performing the following activities: 05/07/2017 05/07/2017  Hearing? - Y   Vision? - N  Comment - -  Difficulty concentrating or making decisions? - Y  Comment - "problems remembering"  Walking or climbing stairs? - Y  Dressing or bathing? - Y  Doing errands, shopping? Y -  Comment "I don't drive" -  Preparing Food and eating ? - -  Using the Toilet? - -  In the past six months, have you accidently leaked urine? - -  Do you have problems with loss of bowel control? - -  Managing your Medications? - -  Managing your Finances? - -  Housekeeping or managing your Housekeeping? - -  Comment - -  Some recent data might be hidden    Fall/Depression Screening: Fall Risk  04/28/2017 03/04/2017 01/15/2017  Falls in the past year? Yes Yes No  Comment - - -  Number falls in past yr: 2 or more 2 or more -  Comment - - -  Injury with Fall? Yes Yes -  Comment - - -  Risk Factor Category  High Fall Risk High Fall Risk -  Risk for fall due to : - - -  Risk for fall due to: Comment - - -  Follow up - - -   PHQ 2/9 Scores 04/28/2017 03/04/2017 01/15/2017 12/26/2016 11/07/2016 10/15/2016 08/20/2016  PHQ - 2 Score 0 0 0 0 0 0 0    Assessment: Patient continues to benefit from care manager outreach for disease management and support.    Plan:  [facility] CM Care Plan Problem One     Most Recent Value  Care Plan Problem One  Recent stent placement  Role Documenting the Problem One  Care Management Telephonic Coordinator  Care Plan for Problem One  Active  [facility] CM Short Term Goal #1   Patient will report healing of stent placement site within 30 days.   [facility] CM Short Term Goal #1 Start Date  05/11/17  Interventions for Short Term Goal #1  RN CM discussed with patient stent site and signs of infection to site.   [facility] CM Short Term Goal #2   Patient will see doctor for follow up as scheduled within 14 days.  [facility] CM Short Term Goal #2 Start Date  05/11/17  Interventions for Short Term Goal #2  RN CM discussed with patient follow up appointment.  Patient has appointment on Dec 28th.       [facility] CM Care Plan Problem Two     Most Recent Value  Care Plan Problem Two  COPD, knowledge deficit  Role Documenting the Problem Two  Care Management Telephonic Coordinator  Care Plan for Problem Two  Active  Interventions for Problem Two Long Term Goal   RN CM discussed with patient signs of COPD exacerbation and when to seek assistance.    [facility]N Long Term Goal  Patient will not have COPD exacerbation  in the next 60 days  [facility] Long Term Goal Start Date  04/28/17 Barrie Folk continued]     RN CM will contact patient in the month of January and patient agrees to next outreach.  Jone Baseman, RN, MSN [facility] Care Management Care Management Coordinator Direct Line [phone] Toll Free: [phone]  Fax: [phone]

## 2017-05-13 DIAGNOSIS — I639 Cerebral infarction, unspecified: Secondary | ICD-10-CM | POA: Diagnosis not present

## 2017-05-13 DIAGNOSIS — K219 Gastro-esophageal reflux disease without esophagitis: Secondary | ICD-10-CM | POA: Diagnosis not present

## 2017-05-13 DIAGNOSIS — M109 Gout, unspecified: Secondary | ICD-10-CM | POA: Diagnosis not present

## 2017-05-13 DIAGNOSIS — I119 Hypertensive heart disease without heart failure: Secondary | ICD-10-CM | POA: Diagnosis not present

## 2017-05-13 DIAGNOSIS — I25118 Atherosclerotic heart disease of native coronary artery with other forms of angina pectoris: Secondary | ICD-10-CM | POA: Diagnosis not present

## 2017-05-13 DIAGNOSIS — E119 Type 2 diabetes mellitus without complications: Secondary | ICD-10-CM | POA: Diagnosis not present

## 2017-05-13 DIAGNOSIS — I5030 Unspecified diastolic (congestive) heart failure: Secondary | ICD-10-CM | POA: Diagnosis not present

## 2017-05-13 DIAGNOSIS — I252 Old myocardial infarction: Secondary | ICD-10-CM | POA: Diagnosis not present

## 2017-05-13 DIAGNOSIS — E785 Hyperlipidemia, unspecified: Secondary | ICD-10-CM | POA: Diagnosis not present

## 2017-05-22 ENCOUNTER — Telehealth: Payer: Self-pay | Admitting: Family Medicine

## 2017-05-22 ENCOUNTER — Encounter (INDEPENDENT_AMBULATORY_CARE_PROVIDER_SITE_OTHER): Payer: Self-pay | Admitting: *Deleted

## 2017-05-22 ENCOUNTER — Telehealth (INDEPENDENT_AMBULATORY_CARE_PROVIDER_SITE_OTHER): Payer: Self-pay | Admitting: *Deleted

## 2017-05-22 MED ORDER — PEG 3350-KCL-NA BICARB-NACL 420 G PO SOLR: 4000.0000 mL | Freq: Once | ORAL | 0 refills | Status: AC

## 2017-05-22 NOTE — Telephone Encounter (Signed)
Patient needs trilyte 

## 2017-05-22 NOTE — Telephone Encounter (Signed)
[name] at Dr. Olevia Perches office left message on nurse line regarding patient. She states that patient is scheduled for a colonoscopy 06/25/17 and will need to stop Aspirin (5 days prior) and Plavix (7 days prior) before procedure. Please call/ or send Epic message to inform if this is okay.  [phone]

## 2017-05-25 ENCOUNTER — Other Ambulatory Visit: Payer: Self-pay | Admitting: Pharmacist

## 2017-05-25 ENCOUNTER — Telehealth (INDEPENDENT_AMBULATORY_CARE_PROVIDER_SITE_OTHER): Payer: Self-pay | Admitting: *Deleted

## 2017-05-25 NOTE — Telephone Encounter (Signed)
Caroline faxed to the office

## 2017-05-25 NOTE — Telephone Encounter (Signed)
agree

## 2017-05-25 NOTE — Telephone Encounter (Signed)
Referring MD/PCP: simpson   Procedure: tcs  Reason/Indication:  Hx polyps  Has patient had this procedure before?  Yes, 2013  If so, when, by whom and where?    Is there a family history of colon cancer?  no  Who?  What age when diagnosed?    Is patient diabetic?   yes      Does patient have prosthetic heart valve or mechanical valve?  no  Do you have a pacemaker?  no  Has patient ever had endocarditis? no  Has patient had joint replacement within last 12 months?  no  Is patient constipated or take laxatives? no  Does patient have a history of alcohol/drug use?  no  Is patient on Coumadin, Plavix and/or Aspirin? yes  Medications: see epic  Allergies: nkda  Medication Adjustment per Dr Laural Golden: as 2 days, plvix 5 days, decrease novolin 15 units morning before, 10 units as needed, don't take metformin day before  Procedure date & time: 06/25/17 at 930

## 2017-05-25 NOTE — Patient Outreach (Signed)
[place] [facility]) Care Management  05/25/2017  [name] 1941/03/01 [id number]  Patient was referred to [place] Pharmacist for medication assistance evaluation, specifically for tiotropium.   Unsuccessful phone outreach to patient---HIPAA compliant message left requesting return call.   Plan:  If no return call, second phone outreach in the next week.   Karrie Meres, PharmD, [place] [phone]

## 2017-05-26 ENCOUNTER — Other Ambulatory Visit: Payer: Self-pay

## 2017-05-26 NOTE — Addendum Note (Signed)
Addended by: Jone Baseman on: 05/26/2017 10:54 AM   Modules accepted: Orders

## 2017-05-26 NOTE — Patient Outreach (Signed)
[place] [facility]) Care Management  [place]  05/26/2017   [name] [birth year] [id number]  Subjective: Telephone call to patient for monthly phone call.  Patient reports no problems since stent placement.  Patient reports he saw Dr. Terrence [name] since last conversation.  Patient reports he is in the green zone today.  Reviewed with patient COPD zones and when to seek help.  Wife reviewed medications.  She asked for any resources or help with bathroom modifications as it is getting harder to get patient in and out of the shower.  Advised will do social work referral for possible resources. She verbalized understanding. No other concerns.   Objective:   Encounter Medications:  Outpatient Encounter Medications as of 05/26/2017  Medication Sig Note  . acetaminophen (TYLENOL) 500 MG tablet Take 500 mg by mouth every 6 (six) hours as needed for moderate pain (left thigh).   Marland Kitchen albuterol (PROVENTIL HFA;VENTOLIN HFA) 108 (90 Base) MCG/ACT inhaler Inhale 2 puffs into the lungs every 4 (four) hours as needed for wheezing or shortness of breath.   . allopurinol (ZYLOPRIM) 300 MG tablet TAKE 1 TABLET ONE TIME DAILY (Patient taking differently: TAKE 300 mg TABLET ONE TIME DAILY)   . aspirin EC 81 MG tablet Take 81 mg by mouth daily.   . clopidogrel (PLAVIX) 75 MG tablet Take 1 tablet (75 mg total) by mouth daily.   . ergocalciferol (VITAMIN D2) 50000 units capsule Take 1 capsule (50,000 Units total) by mouth once a week. One capsule once weekly   . ezetimibe (ZETIA) 10 MG tablet Take 1 tablet (10 mg total) by mouth daily.   . ferrous sulfate 325 (65 FE) MG tablet Take 1 tablet (325 mg total) by mouth 3 (three) times daily with meals.   . insulin NPH-regular Human (NOVOLIN 70/30 RELION) (70-30) 100 UNIT/ML injection INJECT20 UNITS INTO SKIN WITH BREAKFAST AND SUPPER WHEN GLUCOSE ID ABOVE 90   . levETIRAcetam (KEPPRA) 500 MG tablet Take 500 mg by mouth daily.   Marland Kitchen levothyroxine  (SYNTHROID, LEVOTHROID) 88 MCG tablet Take 1 tablet (88 mcg total) by mouth every morning.   . meclizine (ANTIVERT) 25 MG tablet Take 1 tablet (25 mg total) by mouth 3 (three) times daily as needed for dizziness.   . metoprolol tartrate (LOPRESSOR) 50 MG tablet Take 0.5 tablets (25 mg total) by mouth 2 (two) times daily.   . nitroGLYCERIN (NITROSTAT) 0.4 MG SL tablet Place 1 tablet (0.4 mg total) under the tongue every 5 (five) minutes as needed for chest pain.   . pantoprazole (PROTONIX) 40 MG tablet TAKE 1 TABLET EVERY DAY BEFORE BREAKFAST (Patient taking differently: Take 40 mg by mouth daily. TAKE 1 TABLET EVERY DAY BEFORE BREAKFAST)   . pravastatin (PRAVACHOL) 40 MG tablet Take 2 tablets (80 mg total) by mouth daily.   Marland Kitchen senna (SENOKOT) 8.6 MG tablet Take 1-2 tablets by mouth daily as needed for constipation.    Marland Kitchen spironolactone (ALDACTONE) 25 MG tablet Take 1 tablet (25 mg total) by mouth daily.   Marland Kitchen tiotropium (SPIRIVA HANDIHALER) 18 MCG inhalation capsule Place 1 capsule (18 mcg total) into inhaler and inhale daily.   Marland Kitchen triamcinolone cream (KENALOG) 0.1 % Apply 1 application topically 2 (two) times daily. Apply twice daily to rash on right elbow  , under a moist wrap for 4 weeks (Patient taking differently: Apply 1 application topically 2 (two) times daily as needed (for affected area of skin on elbow). Apply twice daily to rash on  right elbow  , under a moist wrap for 4 weeks) 04/18/2016: As needed  . vitamin B-12 (CYANOCOBALAMIN) 1000 MCG tablet Take 1,000 mcg by mouth daily.   Marland Kitchen lisinopril (PRINIVIL,ZESTRIL) 40 MG tablet Take 1 tablet (40 mg total) by mouth daily. (Patient not taking: Reported on 05/26/2017) 05/26/2017: Doctor D/C   No facility-administered encounter medications on file as of 05/26/2017.     Functional Status:  In your present state of health, do you have any difficulty performing the following activities: 05/26/2017 05/07/2017  Hearing? [name]? N -  Comment - -   Difficulty concentrating or making decisions? Y -  Comment - -  Walking or climbing stairs? Y -  Dressing or bathing? - -  Doing errands, shopping? Y Y  Comment - "I don't drive"  Preparing Food and eating ? N -  Using the Toilet? N -  In the past six months, have you accidently leaked urine? N -  Do you have problems with loss of bowel control? N -  Managing your Medications? Y -  Managing your Finances? Y -  Housekeeping or managing your Housekeeping? Y -  Comment - -  Some recent data might be hidden    Fall/Depression Screening: Fall Risk  05/26/2017 04/28/2017 03/04/2017  Falls in the past year? Yes Yes Yes  Comment - - -  Number falls in past yr: 2 or more 2 or more 2 or more  Comment - - -  Injury with Fall? - Yes Yes  Comment - - -  Risk Factor Category  - High Fall Risk High Fall Risk  Risk for fall due to : - - -  Risk for fall due to: Comment - - -  Follow up - - -   PHQ 2/9 Scores 05/26/2017 04/28/2017 03/04/2017 01/15/2017 12/26/2016 11/07/2016 10/15/2016  PHQ - 2 Score 0 0 0 0 0 0 0    Assessment: Patient continues to benefit from care manager outreach for disease management and support.    Plan:  [facility] CM Care Plan Problem One     Most Recent Value  Care Plan Problem One  Recent stent placement  Role Documenting the Problem One  Care Management Telephonic Coordinator  Care Plan for Problem One  Active  [facility] CM Short Term Goal #1   Patient will report healing of stent placement site within 30 days.   [facility] CM Short Term Goal #1 Start Date  05/11/17  [facility] CM Short Term Goal #1 Met Date  05/26/17  Interventions for Short Term Goal #1  Patient reports no problems.   [facility] CM Short Term Goal #2   Patient will see doctor for follow up as scheduled within 14 days.  [facility] CM Short Term Goal #2 Start Date  05/11/17  [facility] CM Short Term Goal #2 Met Date  05/26/17  Interventions for Short Term Goal #2  Patient saw physician.    [facility] CM Care Plan Problem Two     Most Recent Value   Care Plan Problem Two  COPD, knowledge deficit  Role Documenting the Problem Two  Care Management Telephonic Coordinator  Care Plan for Problem Two  Active  Interventions for Problem Two Long Term Goal   RN CM reviewed with patient signs of COPD exacerbation and when to seek assistance.    [facility]N Long Term Goal  Patient will not have COPD exacerbation in the next 60 days  [facility] Long Term Goal Start Date  04/28/17 [goal continued]  RN CM will contact patient in the month of February and patient agrees to next outreach.   Jone Baseman, RN, MSN [facility] Care Management Care Management Coordinator Direct Line [phone] Toll Free: [phone]  Fax: [phone]

## 2017-05-27 ENCOUNTER — Ambulatory Visit: Payer: Self-pay | Admitting: Pharmacist

## 2017-05-28 ENCOUNTER — Other Ambulatory Visit: Payer: Self-pay | Admitting: Pharmacist

## 2017-05-28 ENCOUNTER — Ambulatory Visit: Payer: Self-pay | Admitting: Pharmacist

## 2017-05-28 NOTE — Patient Outreach (Signed)
[facility]) Care Management  05/28/2017  [name] 1941/04/17 [id number]  Second outreach attempt to patient.  Patient referred to [facility] Pharmacist for medication assistance related to Spiriva inhaler.  Patient answered, HIPAA details verified, purpose of call explained to patient.  Patient reports his medication cost concern is only related to Spiriva.  Discussed when he tried to fill his inhaler in December, he may have been in coverage gap.  Discussed it became a new plan year on 05/19/17 and his coverage gap "re-set" on the new year.   Coverage gap does remain for Part D plans in 2019.    Began to discuss patient assistance options, he requested his spouse be spoken with.    Discussed Programmer, applications Help---spouse reports patient is over income.  Discussed [place] (Spiriva) ---spouse believes patient is over income.  Discussed [employer] patient assistance (umeclidinium)---income and out-of-pocket prescription spend requirements---spouse reports unsure if patient will meet these.    Spouse asked if there is a way to get Spiriva free from insurance.  She reports patient still has [employer] Advantage---reviewed plan preferred drug list and summary of benefits--appears Spiriva is Tier 3 with a $45-47 co-pay/30 day supply.  Discussed if patient does not qualify for manufacturer patient assistance, there may not be a way to get inhaler free.    Offered to review medications, spouse denies wanting to do this for patient---reports he gets many of his medications from [facility] mail order and some from local pharmacy.    Spouse reports they will discuss with MD inhaler options and if they wish to try to apply for manufacturer assistance, they will contact [facility] Pharmacist.   Plan:  Will route note to PCP as update.   Pharmacy episode will be closed.    Karrie Meres, PharmD, [place] [phone]

## 2017-05-29 ENCOUNTER — Other Ambulatory Visit: Payer: Self-pay | Admitting: *Deleted

## 2017-05-29 NOTE — Patient Outreach (Signed)
[place] [facility]) Care Management  05/29/2017  [name] May 22, 1940 [id number]   CSW had received referral from [place], [place] for community resources & assistance with home modifications. CSW called & spoke with patient & his wife, [name] to setup a visit for next Tuesday, 1/14 for an initial home visit.   Full assessment & note to follow.    Raynaldo Opitz, LCSW [facility]  Clinical Social Worker cell #: [phone]

## 2017-06-01 ENCOUNTER — Ambulatory Visit: Payer: Medicare HMO | Admitting: Podiatry

## 2017-06-01 DIAGNOSIS — E782 Mixed hyperlipidemia: Secondary | ICD-10-CM | POA: Diagnosis not present

## 2017-06-01 DIAGNOSIS — E1142 Type 2 diabetes mellitus with diabetic polyneuropathy: Secondary | ICD-10-CM | POA: Diagnosis not present

## 2017-06-01 LAB — COMPLETE METABOLIC PANEL WITH GFR
AG RATIO: 1.5 (calc) (ref 1.0–2.5)
ALT: 6 U/L — ABNORMAL LOW (ref 9–46)
AST: 8 U/L — ABNORMAL LOW (ref 10–35)
Albumin: 4 g/dL (ref 3.6–5.1)
Alkaline phosphatase (APISO): 65 U/L (ref 40–115)
BILIRUBIN TOTAL: 0.4 mg/dL (ref 0.2–1.2)
BUN: 19 mg/dL (ref 7–25)
CALCIUM: 9.2 mg/dL (ref 8.6–10.3)
CHLORIDE: 105 mmol/L (ref 98–110)
CO2: 28 mmol/L (ref 20–32)
Creat: 1.14 mg/dL (ref 0.70–1.18)
GFR, EST AFRICAN AMERICAN: 73 mL/min/{1.73_m2} (ref >=60)
GFR, EST NON AFRICAN AMERICAN: 63 mL/min/{1.73_m2} (ref >=60)
GLOBULIN: 2.6 g/dL (ref 1.9–3.7)
Glucose, Bld: 110 mg/dL — ABNORMAL HIGH (ref 65–99)
POTASSIUM: 4.1 mmol/L (ref 3.5–5.3)
SODIUM: 138 mmol/L (ref 135–146)
TOTAL PROTEIN: 6.6 g/dL (ref 6.1–8.1)

## 2017-06-01 LAB — LIPID PANEL
CHOLESTEROL: 101 mg/dL (ref <200)
HDL: 22 mg/dL — AB (ref >40)
LDL Cholesterol (Calc): 55 mg/dL (calc)
Non-HDL Cholesterol (Calc): 79 mg/dL (calc) (ref <130)
TRIGLYCERIDES: 162 mg/dL — AB (ref <150)
Total CHOL/HDL Ratio: 4.6 (calc) (ref <5.0)

## 2017-06-02 ENCOUNTER — Other Ambulatory Visit: Payer: Self-pay | Admitting: *Deleted

## 2017-06-03 ENCOUNTER — Encounter: Payer: Self-pay | Admitting: *Deleted

## 2017-06-04 NOTE — Patient Outreach (Signed)
[place] [facility]) Care Management  [facility] Social Work  06/04/2017  [name] [birth year] [id number]   Encounter Medications:  Outpatient Encounter Medications as of 06/02/2017  Medication Sig Note  . acetaminophen (TYLENOL) 500 MG tablet Take 500 mg by mouth every 6 (six) hours as needed for moderate pain (left thigh).   Marland Kitchen albuterol (PROVENTIL HFA;VENTOLIN HFA) 108 (90 Base) MCG/ACT inhaler Inhale 2 puffs into the lungs every 4 (four) hours as needed for wheezing or shortness of breath.   . allopurinol (ZYLOPRIM) 300 MG tablet TAKE 1 TABLET ONE TIME DAILY (Patient taking differently: TAKE 300 mg TABLET ONE TIME DAILY)   . aspirin EC 81 MG tablet Take 81 mg by mouth daily.   . clopidogrel (PLAVIX) 75 MG tablet Take 1 tablet (75 mg total) by mouth daily.   . ergocalciferol (VITAMIN D2) 50000 units capsule Take 1 capsule (50,000 Units total) by mouth once a week. One capsule once weekly   . ezetimibe (ZETIA) 10 MG tablet Take 1 tablet (10 mg total) by mouth daily.   . ferrous sulfate 325 (65 FE) MG tablet Take 1 tablet (325 mg total) by mouth 3 (three) times daily with meals.   . insulin NPH-regular Human (NOVOLIN 70/30 RELION) (70-30) 100 UNIT/ML injection INJECT20 UNITS INTO SKIN WITH BREAKFAST AND SUPPER WHEN GLUCOSE ID ABOVE 90   . levETIRAcetam (KEPPRA) 500 MG tablet Take 500 mg by mouth daily.   Marland Kitchen levothyroxine (SYNTHROID, LEVOTHROID) 88 MCG tablet Take 1 tablet (88 mcg total) by mouth every morning.   Marland Kitchen lisinopril (PRINIVIL,ZESTRIL) 40 MG tablet Take 1 tablet (40 mg total) by mouth daily. (Patient not taking: Reported on 05/26/2017) 05/26/2017: Doctor D/C  . meclizine (ANTIVERT) 25 MG tablet Take 1 tablet (25 mg total) by mouth 3 (three) times daily as needed for dizziness.   . metoprolol tartrate (LOPRESSOR) 50 MG tablet Take 0.5 tablets (25 mg total) by mouth 2 (two) times daily.   . nitroGLYCERIN (NITROSTAT) 0.4 MG SL tablet Place 1 tablet (0.4 mg total) under the tongue  every 5 (five) minutes as needed for chest pain.   . pantoprazole (PROTONIX) 40 MG tablet TAKE 1 TABLET EVERY DAY BEFORE BREAKFAST (Patient taking differently: Take 40 mg by mouth daily. TAKE 1 TABLET EVERY DAY BEFORE BREAKFAST)   . pravastatin (PRAVACHOL) 40 MG tablet Take 2 tablets (80 mg total) by mouth daily.   Marland Kitchen senna (SENOKOT) 8.6 MG tablet Take 1-2 tablets by mouth daily as needed for constipation.    Marland Kitchen spironolactone (ALDACTONE) 25 MG tablet Take 1 tablet (25 mg total) by mouth daily.   Marland Kitchen tiotropium (SPIRIVA HANDIHALER) 18 MCG inhalation capsule Place 1 capsule (18 mcg total) into inhaler and inhale daily.   Marland Kitchen triamcinolone cream (KENALOG) 0.1 % Apply 1 application topically 2 (two) times daily. Apply twice daily to rash on right elbow  , under a moist wrap for 4 weeks (Patient taking differently: Apply 1 application topically 2 (two) times daily as needed (for affected area of skin on elbow). Apply twice daily to rash on right elbow  , under a moist wrap for 4 weeks) 04/18/2016: As needed  . vitamin B-12 (CYANOCOBALAMIN) 1000 MCG tablet Take 1,000 mcg by mouth daily.    No facility-administered encounter medications on file as of 06/02/2017.     Functional Status:  In your present state of health, do you have any difficulty performing the following activities: 05/26/2017 05/07/2017  Hearing? [place]? N -  Comment - -  Difficulty concentrating or making decisions? Y -  Comment - -  Walking or climbing stairs? Y -  Dressing or bathing? - -  Doing errands, shopping? Y Y  Comment - "I don't drive"  Preparing Food and eating ? N -  Using the Toilet? N -  In the past six months, have you accidently leaked urine? N -  Do you have problems with loss of bowel control? N -  Managing your Medications? Y -  Managing your Finances? Y -  Housekeeping or managing your Housekeeping? Y -  Comment - -  Some recent data might be hidden    Fall/Depression Screening:  PHQ 2/9 Scores 06/04/2017  05/26/2017 04/28/2017 03/04/2017 01/15/2017 12/26/2016 11/07/2016  PHQ - 2 Score 0 0 0 0 0 0 0    Assessment:  CSW had received referral from [place], [place] for community resources & assistance with home modifications. CSW met with patient & his wife at their home in [place] that wife reports they have been living in for the past 43 years. Patient's wife states that she would like to see if they would be able to get someone to come out and replace their tub shower with a walk-in-shower as it is hard for patient to [facility] over the tub and if afraid of falling. Currently, patient has a shower chair that he uses to transfer into the tub but can see how he does not feel that will work out for the long run. Patient also inquired about a wider 3-in-1 commode as he feels the one that they currently have is too tight when he sits down. CSW encouraged patient to bring it up during his next appointment with Dr. Moshe Cipro next Tuesday, 06/09/17. CSW will also send a message to Dr. Griffin Dakin LPN, [place] Hatchet to see if Dr. Moshe Cipro would be able to write a prescription for patient to get a bariatric 3-in-1 commode. CSW provided patient & his wife with information on [place] [facility] for patient's wife to call & inquire.   CSW also spoke with patient & his wife about advance directives and provided them both with a packet and encouraged that they look it over and speak with their family about it. Patient expressed interest in completing and states that his cousin is a Patent examiner and would definitely look into it. CSW encouraged patient & wife to call CSW once it is complete so CSW can scan it into Epic.   CSW spoke with patient about transportation, but patient states that he is no longer driving as his eye sight has deteriorated after his stroke but that his wife, [name] takes him to all his appointments and does not have issues getting in and out of the car. CSW spoke with patient about getting  out of the house for socialization, maybe going to [employer] or [employer] with [employer]. Patient's wife states that she has been trying to get him to to go to the senior center since the new one opened in [place]. CSW spoke with patient about his interests - patient states that he always enjoyed fishing, card games and [place] checkers. CSW will check into whether [facility] has fishing and a monthly calendar of events for patient & his wife to attend.   Plan:   [facility] CM Care Plan Problem One     Most Recent Value  Care Plan Problem One  Connection with community resources & assistance with home modifications  Role Documenting the Problem One  Clinical Social Worker  Care Plan for Problem One  Active  [facility] Long Term Goal   Patient or patient's wife will contact local resources for home modifications ([facility] or [facility]) within the next 60 days.   [facility] Long Term Goal Start Date  06/04/17  Interventions for Problem One Long Term Goal  CSW provided patient & wife information on [place] [facility] group & [facility] and encouraged patient's wife to call  [facility] CM Short Term Goal #1   Patient & wife will review Advance Directive packet provided to them by CSW  [facility] CM Short Term Goal #1 Start Date  06/04/17  Interventions for Short Term Goal #1  CSW provided packet & encouraged them to look it over and discuss with family      Raynaldo Opitz, [place] Social Worker cell #: [phone]

## 2017-06-05 ENCOUNTER — Telehealth: Payer: Self-pay

## 2017-06-05 MED ORDER — UNABLE TO FIND: 0 refills | Status: DC

## 2017-06-05 NOTE — Telephone Encounter (Signed)
-----  Message from Standley Brooking, [place] sent at 06/04/2017  3:41 PM EST ----- Regarding: 3-in-1 commode Rana Snare -   I met with Mr. Kittel today and he is asking if he could get a bariatric 3-in-1 commode, he says the one he currently has is a little tight. He has an appointment coming up with Dr. Moshe Cipro on Tues, 06/09/17 if she could write a prescription for him then.   Thanks!  Raynaldo Opitz, LCSW [facility]  Clinical Social Worker cell #: [phone]

## 2017-06-08 ENCOUNTER — Telehealth: Payer: Self-pay | Admitting: Family Medicine

## 2017-06-08 DIAGNOSIS — G4733 Obstructive sleep apnea (adult) (pediatric): Secondary | ICD-10-CM | POA: Diagnosis not present

## 2017-06-08 DIAGNOSIS — Z8673 Personal history of transient ischemic attack (TIA), and cerebral infarction without residual deficits: Secondary | ICD-10-CM | POA: Diagnosis not present

## 2017-06-08 DIAGNOSIS — I5030 Unspecified diastolic (congestive) heart failure: Secondary | ICD-10-CM | POA: Diagnosis not present

## 2017-06-08 DIAGNOSIS — J441 Chronic obstructive pulmonary disease with (acute) exacerbation: Secondary | ICD-10-CM | POA: Diagnosis not present

## 2017-06-08 NOTE — Telephone Encounter (Signed)
[facility]--calling in to advise that the patient needs to be over 300 pds to qualify for a Bariatric bed side. They normal standard order, and notes

## 2017-06-09 ENCOUNTER — Encounter: Payer: Self-pay | Admitting: Family Medicine

## 2017-06-09 ENCOUNTER — Ambulatory Visit (INDEPENDENT_AMBULATORY_CARE_PROVIDER_SITE_OTHER): Payer: Medicare HMO | Admitting: Family Medicine

## 2017-06-09 VITALS — BP 144/74 | HR 65 | Resp 16 | Ht 70.0 in | Wt 250.0 lb

## 2017-06-09 DIAGNOSIS — Z1211 Encounter for screening for malignant neoplasm of colon: Secondary | ICD-10-CM

## 2017-06-09 DIAGNOSIS — I10 Essential (primary) hypertension: Secondary | ICD-10-CM | POA: Diagnosis not present

## 2017-06-09 DIAGNOSIS — Z09 Encounter for follow-up examination after completed treatment for conditions other than malignant neoplasm: Secondary | ICD-10-CM

## 2017-06-09 DIAGNOSIS — J449 Chronic obstructive pulmonary disease, unspecified: Secondary | ICD-10-CM

## 2017-06-09 DIAGNOSIS — E1121 Type 2 diabetes mellitus with diabetic nephropathy: Secondary | ICD-10-CM | POA: Diagnosis not present

## 2017-06-09 DIAGNOSIS — I639 Cerebral infarction, unspecified: Secondary | ICD-10-CM | POA: Diagnosis not present

## 2017-06-09 DIAGNOSIS — E785 Hyperlipidemia, unspecified: Secondary | ICD-10-CM | POA: Diagnosis not present

## 2017-06-09 DIAGNOSIS — E782 Mixed hyperlipidemia: Secondary | ICD-10-CM

## 2017-06-09 DIAGNOSIS — I2510 Atherosclerotic heart disease of native coronary artery without angina pectoris: Secondary | ICD-10-CM | POA: Diagnosis not present

## 2017-06-09 DIAGNOSIS — E559 Vitamin D deficiency, unspecified: Secondary | ICD-10-CM | POA: Diagnosis not present

## 2017-06-09 DIAGNOSIS — Z794 Long term (current) use of insulin: Secondary | ICD-10-CM

## 2017-06-09 LAB — POC HEMOCCULT BLD/STL (OFFICE/1-CARD/DIAGNOSTIC): FECAL OCCULT BLD: NEGATIVE

## 2017-06-09 MED ORDER — CLOPIDOGREL BISULFATE 75 MG PO TABS: 75.0000 mg | ORAL_TABLET | Freq: Every day | ORAL | 1 refills | Status: DC

## 2017-06-09 MED ORDER — ERGOCALCIFEROL 1.25 MG (50000 UT) PO CAPS: 50000.0000 [IU] | ORAL_CAPSULE | ORAL | 1 refills | Status: DC

## 2017-06-09 MED ORDER — EZETIMIBE 10 MG PO TABS: 10.0000 mg | ORAL_TABLET | Freq: Every day | ORAL | 5 refills | Status: DC

## 2017-06-09 MED ORDER — PANTOPRAZOLE SODIUM 40 MG PO TBEC: DELAYED_RELEASE_TABLET | ORAL | 1 refills | Status: DC

## 2017-06-09 MED ORDER — PRAVASTATIN SODIUM 40 MG PO TABS: 80.0000 mg | ORAL_TABLET | Freq: Every day | ORAL | 1 refills | Status: DC

## 2017-06-09 MED ORDER — SPIRONOLACTONE 25 MG PO TABS: 25.0000 mg | ORAL_TABLET | Freq: Every day | ORAL | 1 refills | Status: DC

## 2017-06-09 MED ORDER — METOPROLOL TARTRATE 50 MG PO TABS: 25.0000 mg | ORAL_TABLET | Freq: Two times a day (BID) | ORAL | 1 refills | Status: DC

## 2017-06-09 MED ORDER — UNABLE TO FIND: 0 refills | Status: DC

## 2017-06-09 NOTE — Assessment & Plan Note (Signed)
Treated by endo and well controlled  [name] is reminded of the importance of commitment to daily physical activity for 30 minutes or more, as able and the need to limit carbohydrate intake to 30 to 60 grams per meal to help with blood sugar control.   The need to take medication as prescribed, test blood sugar as directed, and to call between visits if there is a concern that blood sugar is uncontrolled is also discussed.   [name] is reminded of the importance of daily foot exam, annual eye examination, and good blood sugar, blood pressure and cholesterol control.  Diabetic Labs Latest Ref Rng & Units 06/01/2017 05/08/2017 05/07/2017 04/21/2017 04/20/2017  HbA1c 4.8 - 5.6 % - - 6.3(H) 6.3(H) 6.5(H)  Microalbumin <2.0 mg/dL - - - - -  Micro/Creat Ratio 0.0 - 30.0 mg/g - - - - -  Chol <200 mg/dL 101 - - - 98  HDL >40 mg/dL 22(L) - - - 24(L)  Calc LDL 0 - 99 mg/dL - - - - 39  Triglycerides <150 mg/dL 162(H) - - - 173(H)  Creatinine 0.70 - 1.18 mg/dL 1.14 1.18 1.25(H) 1.20(H) 1.18   BP/Weight 06/09/2017 05/08/2017 05/07/2017 05/06/2017 04/20/2017 03/19/2017 09/38/1829  Systolic BP 937 169 - 678 938 101 751  Diastolic BP 74 69 - 76 67 64 59  Wt. (Lbs) 250 250.88 - 256 - 260 259  BMI 35.87 - 36 36.73 - 37.31 37.16   Foot/eye exam completion dates Latest Ref Rng & Units 12/01/2016 09/30/2016  Eye Exam No Retinopathy - Retinopathy(A)  Foot exam Order - - -  Foot Form Completion - Done -

## 2017-06-09 NOTE — Assessment & Plan Note (Addendum)
Hospitalized 12/21 to 05/09/2017 with angioplasty. Record reviewed with patient and spouse. He is doing well post discharge, improved energy, and reports less choking spells. Taking iron regularly,  notes black stool, checked and is heme negative Will go yo cardiac rehab and needs to use supplemental oxygen continually as recommended

## 2017-06-09 NOTE — Telephone Encounter (Signed)
Patient given order for regular 3in1 while in office today

## 2017-06-09 NOTE — Patient Instructions (Addendum)
Wellness with nurse in February, PAST dUE  F/u with mD in May, call if you need me before  Please go to cardiac rehab  Thankful that stenting went well  All the best for 2019!  No blood in stool

## 2017-06-09 NOTE — Assessment & Plan Note (Signed)
Stable, now requiring continual oxygen

## 2017-06-09 NOTE — Assessment & Plan Note (Signed)
Residual rigth hemiparesis

## 2017-06-09 NOTE — Assessment & Plan Note (Signed)
Adequate though sub optimal control, no med change DASH diet and commitment to daily physical activity for a minimum of 30 minutes discussed and encouraged, as a part of hypertension management. The importance of attaining a healthy weight is also discussed.  BP/Weight 06/09/2017 05/08/2017 05/07/2017 05/06/2017 04/20/2017 03/19/2017 05/07/7587  Systolic BP 325 498 - 264 158 309 407  Diastolic BP 74 69 - 76 67 64 59  Wt. (Lbs) 250 250.88 - 256 - 260 259  BMI 35.87 - 36 36.73 - 37.31 37.16

## 2017-06-09 NOTE — Progress Notes (Signed)
Assessment:     Plan:

## 2017-06-09 NOTE — Assessment & Plan Note (Signed)
Hyperlipidemia:Low fat diet discussed and encouraged.   Lipid Panel  Lab Results  Component Value Date   CHOL 101 06/01/2017   HDL 22 (L) 06/01/2017   LDLCALC 39 04/20/2017   LDLDIRECT 50 08/18/2014   TRIG 162 (H) 06/01/2017   CHOLHDL 4.6 06/01/2017   Updated lab needed at/ before next visit. Not at goal

## 2017-06-09 NOTE — Progress Notes (Signed)
[name]     MRN: [id number]      DOB: 1940-07-17   HPI [name] is here for follow up and re-evaluation of chronic medical conditions, medication management and review of any available recent lab and radiology data.  Preventive health is updated, specifically  Cancer screening and Immunization.   He underwent stenting and  Angioplasty on 12/21 and is discharged on iron and on oxygen 2 liters continually C/o black stool He Is referred for cardiac rehab, had not intended to go, but has changed his mind , once I explain that it is for his heart and not for his arthritis pain Requests commode chair at home, he has bad arthritis in his spine and hips which make walking , standing and bending challenging and painful States his choking episodes are less frequent  ROS Denies recent fever or chills. Denies sinus pressure, nasal congestion, ear pain or sore throat. Denies chest congestion, productive cough or wheezing. Denies chest pains, palpitations and leg swelling Denies abdominal pain, nausea, vomiting,diarrhea or constipation.   Denies dysuria, frequency, hesitancy or incontinence.  Denies headaches, seizures, . Denies depression, anxiety or insomnia. Denies skin break down or rash.   PE  BP (!) 144/74   Pulse 65   Resp 16   Ht 5\' 10"  (1.778 m)   Wt 250 lb (113.4 kg)   SpO2 90% Comment: room air  BMI 35.87 kg/m   Patient alert and oriented and in no cardiopulmonary distress.  HEENT: No facial asymmetry, EOMI,   oropharynx pink and moist.  Neck decreased ROM no JVD, no mass.  Chest: Clear to auscultation bilaterally.Decreased air entry bilaterally  CVS: S1, S2 no murmurs, no S3.Regular rate.  ABD: Soft non tender.  Rectal: heme negative stool Ext: No edema  AO:[id number] ROM spine, shoulders, hips and knees.  Skin: Intact, no ulcerations or rash noted.  Psych: Good eye contact, normal affect. Memory impaired  not anxious or depressed appearing.  CNS: CN  2-12 intact,  Grade 4 power in right upper and lower extremities normal  On left   Assessment & Plan  Essential hypertension Adequate though sub optimal control, no med change DASH diet and commitment to daily physical activity for a minimum of 30 minutes discussed and encouraged, as a part of hypertension management. The importance of attaining a healthy weight is also discussed.  BP/Weight 06/09/2017 05/08/2017 05/07/2017 05/06/2017 04/20/2017 03/19/2017 69/62/9528  Systolic BP 413 244 - 010 272 536 644  Diastolic BP 74 69 - 76 67 64 59  Wt. (Lbs) 250 250.88 - 256 - 260 259  BMI 35.87 - 36 36.73 - 37.31 37.16       Stroke (HCC) Residual rigth hemiparesis  COPD (chronic obstructive pulmonary disease) (HCC) Stable, now requiring continual oxygen  Type 2 diabetes mellitus with diabetic nephropathy, with long-term current use of insulin ([place]) Treated by endo and well controlled  [name] is reminded of the importance of commitment to daily physical activity for 30 minutes or more, as able and the need to limit carbohydrate intake to 30 to 60 grams per meal to help with blood sugar control.   The need to take medication as prescribed, test blood sugar as directed, and to call between visits if there is a concern that blood sugar is uncontrolled is also discussed.   [name] is reminded of the importance of daily foot exam, annual eye examination, and good blood sugar, blood pressure and cholesterol control.  Diabetic Labs  Latest Ref Rng & Units 06/01/2017 05/08/2017 05/07/2017 04/21/2017 04/20/2017  HbA1c 4.8 - 5.6 % - - 6.3(H) 6.3(H) 6.5(H)  Microalbumin <2.0 mg/dL - - - - -  Micro/Creat Ratio 0.0 - 30.0 mg/g - - - - -  Chol <200 mg/dL 101 - - - 98  HDL >40 mg/dL 22(L) - - - 24(L)  Calc LDL 0 - 99 mg/dL - - - - 39  Triglycerides <150 mg/dL 162(H) - - - 173(H)  Creatinine 0.70 - 1.18 mg/dL 1.14 1.18 1.25(H) 1.20(H) 1.18   BP/Weight 06/09/2017 05/08/2017 05/07/2017 05/06/2017  04/20/2017 03/19/2017 02/72/5366  Systolic BP 440 347 - 425 956 387 564  Diastolic BP 74 69 - 76 67 64 59  Wt. (Lbs) 250 250.88 - 256 - 260 259  BMI 35.87 - 36 36.73 - 37.31 37.16   Foot/eye exam completion dates Latest Ref Rng & Units 12/01/2016 09/30/2016  Eye Exam No Retinopathy - Retinopathy(A)  Foot exam Order - - -  Foot Form Completion - [facility] discharge follow-up Hospitalized 12/21 to 05/09/2017 with angioplasty. Record reviewed with patient and spouse. He is doing well post discharge, improved energy, and reports less choking spells. Taking iron regularly,  notes black stool, checked and is heme negative Will go yo cardiac rehab and needs to use supplemental oxygen continually as recommended   Mixed hyperlipidemia Hyperlipidemia:Low fat diet discussed and encouraged.   Lipid Panel  Lab Results  Component Value Date   CHOL 101 06/01/2017   HDL 22 (L) 06/01/2017   LDLCALC 39 04/20/2017   LDLDIRECT 50 08/18/2014   TRIG 162 (H) 06/01/2017   CHOLHDL 4.6 06/01/2017   Updated lab needed at/ before next visit. Not at goal

## 2017-06-12 ENCOUNTER — Ambulatory Visit: Payer: Medicare HMO | Admitting: Podiatry

## 2017-06-18 ENCOUNTER — Encounter (HOSPITAL_COMMUNITY): Payer: Self-pay | Admitting: *Deleted

## 2017-06-24 ENCOUNTER — Encounter (HOSPITAL_COMMUNITY): Payer: Self-pay | Admitting: *Deleted

## 2017-06-25 ENCOUNTER — Encounter (HOSPITAL_COMMUNITY): Payer: Self-pay | Admitting: *Deleted

## 2017-06-25 ENCOUNTER — Ambulatory Visit: Payer: Self-pay

## 2017-06-25 ENCOUNTER — Other Ambulatory Visit: Payer: Self-pay

## 2017-06-25 ENCOUNTER — Ambulatory Visit (HOSPITAL_COMMUNITY)
Admission: RE | Admit: 2017-06-25 | Discharge: 2017-06-25 | Disposition: A | Discharge status: Home / Self Care | Payer: Medicare HMO | Source: Ambulatory Visit | Attending: Internal Medicine | Admitting: Internal Medicine

## 2017-06-25 ENCOUNTER — Encounter (HOSPITAL_COMMUNITY)
Admission: RE | Disposition: A | Discharge status: Home / Self Care | Payer: Self-pay | Source: Ambulatory Visit | Attending: Internal Medicine

## 2017-06-25 DIAGNOSIS — Z539 Procedure and treatment not carried out, unspecified reason: Secondary | ICD-10-CM | POA: Insufficient documentation

## 2017-06-25 DIAGNOSIS — K219 Gastro-esophageal reflux disease without esophagitis: Secondary | ICD-10-CM | POA: Diagnosis not present

## 2017-06-25 DIAGNOSIS — J449 Chronic obstructive pulmonary disease, unspecified: Secondary | ICD-10-CM | POA: Insufficient documentation

## 2017-06-25 DIAGNOSIS — E669 Obesity, unspecified: Secondary | ICD-10-CM | POA: Insufficient documentation

## 2017-06-25 DIAGNOSIS — Z79899 Other long term (current) drug therapy: Secondary | ICD-10-CM | POA: Insufficient documentation

## 2017-06-25 DIAGNOSIS — I252 Old myocardial infarction: Secondary | ICD-10-CM | POA: Diagnosis not present

## 2017-06-25 DIAGNOSIS — Z794 Long term (current) use of insulin: Secondary | ICD-10-CM | POA: Insufficient documentation

## 2017-06-25 DIAGNOSIS — Z09 Encounter for follow-up examination after completed treatment for conditions other than malignant neoplasm: Secondary | ICD-10-CM | POA: Diagnosis not present

## 2017-06-25 DIAGNOSIS — Z860100 Personal history of colon polyps, unspecified: Secondary | ICD-10-CM | POA: Insufficient documentation

## 2017-06-25 DIAGNOSIS — Z8249 Family history of ischemic heart disease and other diseases of the circulatory system: Secondary | ICD-10-CM | POA: Diagnosis not present

## 2017-06-25 DIAGNOSIS — Z79890 Hormone replacement therapy (postmenopausal): Secondary | ICD-10-CM | POA: Insufficient documentation

## 2017-06-25 DIAGNOSIS — I10 Essential (primary) hypertension: Secondary | ICD-10-CM | POA: Diagnosis not present

## 2017-06-25 DIAGNOSIS — I2510 Atherosclerotic heart disease of native coronary artery without angina pectoris: Secondary | ICD-10-CM | POA: Insufficient documentation

## 2017-06-25 DIAGNOSIS — Z6835 Body mass index (BMI) 35.0-35.9, adult: Secondary | ICD-10-CM | POA: Diagnosis not present

## 2017-06-25 DIAGNOSIS — Z538 Procedure and treatment not carried out for other reasons: Secondary | ICD-10-CM | POA: Diagnosis not present

## 2017-06-25 DIAGNOSIS — G4733 Obstructive sleep apnea (adult) (pediatric): Secondary | ICD-10-CM | POA: Insufficient documentation

## 2017-06-25 DIAGNOSIS — Z7982 Long term (current) use of aspirin: Secondary | ICD-10-CM | POA: Diagnosis not present

## 2017-06-25 DIAGNOSIS — Z9119 Patient's noncompliance with other medical treatment and regimen: Secondary | ICD-10-CM | POA: Diagnosis not present

## 2017-06-25 DIAGNOSIS — M1712 Unilateral primary osteoarthritis, left knee: Secondary | ICD-10-CM | POA: Diagnosis not present

## 2017-06-25 DIAGNOSIS — Z833 Family history of diabetes mellitus: Secondary | ICD-10-CM | POA: Insufficient documentation

## 2017-06-25 DIAGNOSIS — E785 Hyperlipidemia, unspecified: Secondary | ICD-10-CM | POA: Diagnosis not present

## 2017-06-25 DIAGNOSIS — Z87891 Personal history of nicotine dependence: Secondary | ICD-10-CM | POA: Insufficient documentation

## 2017-06-25 DIAGNOSIS — Z8673 Personal history of transient ischemic attack (TIA), and cerebral infarction without residual deficits: Secondary | ICD-10-CM | POA: Insufficient documentation

## 2017-06-25 DIAGNOSIS — M109 Gout, unspecified: Secondary | ICD-10-CM | POA: Insufficient documentation

## 2017-06-25 DIAGNOSIS — Z7902 Long term (current) use of antithrombotics/antiplatelets: Secondary | ICD-10-CM | POA: Insufficient documentation

## 2017-06-25 DIAGNOSIS — E119 Type 2 diabetes mellitus without complications: Secondary | ICD-10-CM | POA: Insufficient documentation

## 2017-06-25 DIAGNOSIS — Z823 Family history of stroke: Secondary | ICD-10-CM | POA: Diagnosis not present

## 2017-06-25 DIAGNOSIS — Z1211 Encounter for screening for malignant neoplasm of colon: Secondary | ICD-10-CM | POA: Diagnosis not present

## 2017-06-25 HISTORY — PX: COLONOSCOPY: SHX5424

## 2017-06-25 LAB — GLUCOSE, CAPILLARY: GLUCOSE-CAPILLARY: 82 mg/dL (ref 65–99)

## 2017-06-25 SURGERY — COLONOSCOPY
Anesthesia: Moderate Sedation

## 2017-06-25 MED ORDER — SODIUM CHLORIDE 0.9 % IV SOLN: INTRAVENOUS | Status: DC

## 2017-06-25 MED ORDER — MIDAZOLAM HCL 5 MG/5ML IJ SOLN
INTRAMUSCULAR | Status: DC | PRN
  Administered 2017-06-25 (×2): 2 mg via INTRAVENOUS

## 2017-06-25 MED ORDER — STERILE WATER FOR IRRIGATION IR SOLN
Status: DC | PRN
  Administered 2017-06-25: 09:00:00

## 2017-06-25 MED ORDER — MEPERIDINE HCL 50 MG/ML IJ SOLN
INTRAMUSCULAR | Status: DC | PRN
  Administered 2017-06-25 (×2): 25 mg via INTRAVENOUS

## 2017-06-25 MED ORDER — MIDAZOLAM HCL 5 MG/5ML IJ SOLN
INTRAMUSCULAR | Status: AC
  Filled 2017-06-25: qty 10

## 2017-06-25 MED ORDER — MEPERIDINE HCL 50 MG/ML IJ SOLN
INTRAMUSCULAR | Status: AC
  Filled 2017-06-25: qty 1

## 2017-06-25 MED ORDER — DEXTROSE-NACL 5-0.9 % IV SOLN
INTRAVENOUS | Status: DC
  Administered 2017-06-25: 09:00:00 via INTRAVENOUS

## 2017-06-25 NOTE — Discharge Instructions (Signed)
Colonoscopy, Adult, Care After This sheet gives you information about how to care for yourself after your procedure. Your health care provider may also give you more specific instructions. If you have problems or questions, contact your health care provider. What can I expect after the procedure? After the procedure, it is common to have:  A small amount of blood in your stool for 24 hours after the procedure.  Some gas.  Mild abdominal cramping or bloating.  Follow these instructions at home: General instructions   For the first 24 hours after the procedure: ? Do not drive or use machinery. ? Do not sign important documents. ? Do not drink alcohol. ? Do your regular daily activities at a slower pace than normal. ? Eat soft, easy-to-digest foods. ? Rest often.  Take over-the-counter or prescription medicines only as told by your health care provider.  It is up to you to get the results of your procedure. Ask your health care provider, or the department performing the procedure, when your results will be ready. Relieving cramping and bloating  Try walking around when you have cramps or feel bloated.  Apply heat to your abdomen as told by your health care provider. Use a heat source that your health care provider recommends, such as a moist heat pack or a heating pad. ? Place a towel between your skin and the heat source. ? Leave the heat on for 20-30 minutes. ? Remove the heat if your skin turns bright red. This is especially important if you are unable to feel pain, heat, or cold. You may have a greater risk of getting burned. Eating and drinking  Drink enough fluid to keep your urine clear or pale yellow.  Resume your normal diet as instructed by your health care provider. Avoid heavy or fried foods that are hard to digest.  Avoid drinking alcohol for as long as instructed by your health care provider. Contact a health care provider if:  You have blood in your stool 2-3  days after the procedure. Get help right away if:  You have more than a small spotting of blood in your stool.  You pass large blood clots in your stool.  Your abdomen is swollen.  You have nausea or vomiting.  You have a fever.  You have increasing abdominal pain that is not relieved with medicine. This information is not intended to replace advice given to you by your health care provider. Make sure you discuss any questions you have with your health care provider. Document Released: 12/18/2003 Document Revised: 01/28/2016 Document Reviewed: 07/17/2015 Elsevier Interactive Patient Education  2018 [place] usual medications and diet. No driving for 24 hours. Colonoscopy to be rescheduled after 2-day prep.

## 2017-06-25 NOTE — Op Note (Signed)
[facility] Patient Name: [name] Procedure Date: 06/25/2017 8:58 AM MRN: [id number] Date of Birth: 1940-09-23 Attending MD: Hildred Laser , MD CSN: [id number] Age: 77 Admit Type: Outpatient Procedure:                Colonoscopy Indications:              High risk colon cancer surveillance: Personal                            history of colonic polyps Providers:                Hildred Laser, MD, Otis Peak B. Sharon Seller, RN, Nelma Rothman, Technician Referring MD:             Norwood Levo. Moshe Cipro, MD Medicines:                Meperidine 50 mg IV, Midazolam 4 mg IV Complications:            No immediate complications. Estimated Blood Loss:     Estimated blood loss: none. Procedure:                Pre-Anesthesia Assessment:                           - Prior to the procedure, a History and Physical                            was performed, and patient medications and                            allergies were reviewed. The patient's tolerance of                            previous anesthesia was also reviewed. The risks                            and benefits of the procedure and the sedation                            options and risks were discussed with the patient.                            All questions were answered, and informed consent                            was obtained. Prior Anticoagulants: The patient                            last took Plavix (clopidogrel) 2 days prior to the                            procedure. ASA Grade Assessment: III - A patient  with severe systemic disease. After reviewing the                            risks and benefits, the patient was deemed in                            satisfactory condition to undergo the procedure.                           After obtaining informed consent, the colonoscope                            was passed under direct vision. Throughout the                             procedure, the patient's blood pressure, pulse, and                            oxygen saturations were monitored continuously. The                            [id number] ([id number]) scope was introduced through                            the anus with the intention of advancing to the                            cecum. The scope was advanced to the transverse                            colon before the procedure was aborted. Medications                            were given. The colonoscopy was performed without                            difficulty. The patient tolerated the procedure                            well. The quality of the bowel preparation was                            poor. The colonoscopy was aborted due to poor bowel                            prep. The rectum was photographed. Scope In: 9:17:19 AM Scope Out: 9:29:37 AM Total Procedure Duration: 0 hours 12 minutes 18 seconds  Findings:      The perianal and digital rectal examinations were normal.      A large amount of stool was found in the rectum, in the recto-sigmoid       colon, in the sigmoid colon, in the descending colon, at the splenic       flexure and in the transverse colon, precluding visualization. Impression:               -  Preparation of the colon was poor.                           - The procedure was aborted due to poor bowel prep.                           - Stool in the rectum, in the recto-sigmoid colon,                            in the sigmoid colon, in the descending colon, at                            the splenic flexure and in the transverse colon.                           - No specimens collected. Moderate Sedation:      Moderate (conscious) sedation was administered by the endoscopy nurse       and supervised by the endoscopist. The following parameters were       monitored: oxygen saturation, heart rate, blood pressure, CO2       capnography and response to care. Total physician  intraservice time was       17 minutes. Recommendation:           - Patient has a contact number available for                            emergencies. The signs and symptoms of potential                            delayed complications were discussed with the                            patient. Return to normal activities tomorrow.                            Written discharge instructions were provided to the                            patient.                           - Resume previous diet today.                           - Continue present medications.                           - Resume Plavix (clopidogrel) at prior dose today.                           - Repeat colonoscopy at appointment to be scheduled                            for surveillance. Procedure Code(s):        --- Professional ---  62130, 53, Colonoscopy, flexible; diagnostic,                            including collection of specimen(s) by brushing or                            washing, when performed (separate procedure)                           99152, Moderate sedation services provided by the                            same physician or other qualified health care                            professional performing the diagnostic or                            therapeutic service that the sedation supports,                            requiring the presence of an independent trained                            observer to assist in the monitoring of the                            patient's level of consciousness and physiological                            status; initial 15 minutes of intraservice time,                            patient age 43 years or older Diagnosis Code(s):        --- Professional ---                           Z86.010, Personal history of colonic polyps                           Z53.8, Procedure and treatment not carried out for                            other  reasons CPT copyright 2016 American Medical Association. All rights reserved. The codes documented in this report are preliminary and upon coder review may  be revised to meet current compliance requirements. Hildred Laser, MD Hildred Laser, MD 06/25/2017 9:43:06 AM This report has been signed electronically. Number of Addenda: 0

## 2017-06-25 NOTE — H&P (Signed)
[name] is an 77 y.o. male.   Chief Complaint: Patient is here for colonoscopy. HPI: This 77 year old F American male with multiple medical problems who is here for surveillance colonoscopy.  Last exam was about 5 years ago with removal of 10 mm polyp from rectosigmoid junction and was sessile serrated he denies melena or rectal bleeding or abdominal pain.  Family history is negative for CRC. He has COPD and is on nasal O2. Patient stopped Plavix 2 days ago instead of 5 days ago. Family history is negative for CRC.  Past Medical History:  Diagnosis Date  . Abnormality of gait 05/01/2015  . Arthritis    "left leg; knees" (05/07/2017)  . Arthritis of knee   . CAD (coronary artery disease)   . Chronic bronchitis ([place])    "get it q yr"  . Chronic lower back pain    "since I was a teen" (05/07/2017)  . COPD (chronic obstructive pulmonary disease) ([place])   . CVA (cerebral vascular accident) ([place]) 05/2015   R sided weakness (05/07/2017)  . GERD (gastroesophageal reflux disease)   . Gout   . Hyperlipidemia   . Hypertension   . Myocardial infarction ([place]) 1999  . Obesity   . OSA on CPAP   . Pneumonia    "a few times; last time was ~ 06/2013" (05/07/2017)  . Psoriasis   . Tussive syncope 05/01/2015  . Type II diabetes mellitus ([place])   . Vitamin B12 deficiency 05/01/2015    Past Surgical History:  Procedure Laterality Date  . APPENDECTOMY    . CATARACT EXTRACTION W/PHACO Left 10/07/2016   Procedure: CATARACT EXTRACTION PHACO AND INTRAOCULAR LENS PLACEMENT (IOC);  Surgeon: Rutherford Guys, MD;  Location: [facility] ORS;  Service: Ophthalmology;  Laterality: Left;  CDE: 13.49  . CATARACT EXTRACTION W/PHACO Right 10/28/2016   Procedure: CATARACT EXTRACTION PHACO AND INTRAOCULAR LENS PLACEMENT (IOC);  Surgeon: Rutherford Guys, MD;  Location: [facility] ORS;  Service: Ophthalmology;  Laterality: Right;  CDE: 16.71  . COLONOSCOPY  03/17/2012   Procedure: COLONOSCOPY;  Surgeon: Rogene Houston, MD;   Location: [facility] ENDO SUITE;  Service: Endoscopy;  Laterality: N/A;  830  . CORONARY ANGIOPLASTY WITH STENT PLACEMENT  1999- 2009-08/30/2013   "counting today's, I have 5 stents" (08/30/2013)  . CORONARY ANGIOPLASTY WITH STENT PLACEMENT  05/07/2017  . CORONARY STENT INTERVENTION N/A 05/07/2017   Procedure: CORONARY STENT INTERVENTION;  Surgeon: Charolette Forward, MD;  Location: [facility] CV LAB;  Service: Cardiovascular;  Laterality: N/A;  . CYSTOSCOPY WITH URETHRAL DILATATION N/A 11/26/2012   Procedure: CYSTOSCOPY WITH URETHRAL DILATATION;  Surgeon: Malka So, MD;  Location: [facility];  Service: Urology;  Laterality: N/A;  . LEFT HEART CATH AND CORONARY ANGIOGRAPHY N/A 05/07/2017   Procedure: LEFT HEART CATH AND CORONARY ANGIOGRAPHY;  Surgeon: Charolette Forward, MD;  Location: [facility];  Service: Cardiovascular;  Laterality: N/A;  . LEFT HEART CATHETERIZATION WITH CORONARY ANGIOGRAM N/A 08/30/2013   Procedure: LEFT HEART CATHETERIZATION WITH CORONARY ANGIOGRAM;  Surgeon: Clent Demark, MD;  Location: [facility] CATH LAB;  Service: Cardiovascular;  Laterality: N/A;  . PERCUTANEOUS CORONARY STENT INTERVENTION (PCI-S) Right 08/30/2013   Procedure: PERCUTANEOUS CORONARY STENT INTERVENTION (PCI-S);  Surgeon: Clent Demark, MD;  [facility] C[facility] LAB;  Service: Cardiovascular;  Laterality: Right;    Family History  Problem Relation Age of Onset  . Hypertension Mother   . Diabetes Mother   . Hypertension Father   . Diabetes Brother   . Stroke Daughter   .  Arthritis Unknown        Family History   . Diabetes Unknown        family History    Social History:  reports that he quit smoking about 14 years ago. His smoking use included cigarettes. He has a 16.50 pack-year smoking history. he has never used smokeless tobacco. He reports that he does not drink alcohol or use drugs.  Allergies: No Known Allergies  Medications Prior to Admission  Medication Sig Dispense Refill  . acetaminophen (TYLENOL)  500 MG tablet Take 1,000 mg by mouth every 6 (six) hours as needed for moderate pain (left thigh).     Marland Kitchen allopurinol (ZYLOPRIM) 300 MG tablet TAKE 1 TABLET ONE TIME DAILY (Patient taking differently: TAKE 300 MG TABLET ONE TIME DAILY) 90 tablet 5  . aspirin EC 81 MG tablet Take 81 mg by mouth daily.    . clopidogrel (PLAVIX) 75 MG tablet Take 1 tablet (75 mg total) by mouth daily. 90 tablet 1  . ergocalciferol (VITAMIN D2) 50000 units capsule Take 1 capsule (50,000 Units total) by mouth once a week. One capsule once weekly (Patient taking differently: Take 50,000 Units by mouth every Monday. ) 12 capsule 1  . ezetimibe (ZETIA) 10 MG tablet Take 1 tablet (10 mg total) by mouth daily. 30 tablet 5  . ferrous sulfate 325 (65 FE) MG tablet Take 1 tablet (325 mg total) by mouth 3 (three) times daily with meals. 90 tablet 3  . insulin NPH-regular Human (NOVOLIN 70/30 RELION) (70-30) 100 UNIT/ML injection INJECT20 UNITS INTO SKIN WITH BREAKFAST AND SUPPER WHEN GLUCOSE ID ABOVE 90 (Patient taking differently: Inject 20 Units into the skin 2 (two) times daily. WHEN GLUCOSE ID ABOVE 90) 80 mL 0  . levETIRAcetam (KEPPRA) 500 MG tablet Take 500 mg by mouth daily.    Marland Kitchen levothyroxine (SYNTHROID, LEVOTHROID) 88 MCG tablet Take 1 tablet (88 mcg total) by mouth every morning. 30 tablet 6  . losartan (COZAAR) 50 MG tablet Take 50 mg by mouth daily.    . metoprolol tartrate (LOPRESSOR) 50 MG tablet Take 0.5 tablets (25 mg total) by mouth 2 (two) times daily. 90 tablet 1  . OXYGEN Inhale 2 L into the lungs continuous.    . pravastatin (PRAVACHOL) 40 MG tablet Take 2 tablets (80 mg total) by mouth daily. (Patient taking differently: Take 40 mg by mouth 2 (two) times daily. ) 180 tablet 1  . senna (SENOKOT) 8.6 MG tablet Take 1-2 tablets by mouth daily as needed for constipation.     Marland Kitchen spironolactone (ALDACTONE) 25 MG tablet Take 1 tablet (25 mg total) by mouth daily. 90 tablet 1  . tiotropium (SPIRIVA HANDIHALER) 18 MCG  inhalation capsule Place 1 capsule (18 mcg total) into inhaler and inhale daily. (Patient taking differently: Place 18 mcg into inhaler and inhale daily as needed (for shortness of breath or wheezing). ) 30 capsule 2  . triamcinolone cream (KENALOG) 0.1 % Apply 1 application topically 2 (two) times daily. Apply twice daily to rash on right elbow  , under a moist wrap for 4 weeks (Patient taking differently: Apply 1 application topically 2 (two) times daily as needed (for affected area of skin on elbow). Apply twice daily to rash on right elbow  , under a moist wrap for 4 weeks) 45 g 0  . vitamin B-12 (CYANOCOBALAMIN) 1000 MCG tablet Take 1,000 mcg by mouth daily.    Marland Kitchen albuterol (PROVENTIL HFA;VENTOLIN HFA) 108 (90 Base) MCG/ACT inhaler Inhale  2 puffs into the lungs every 4 (four) hours as needed for wheezing or shortness of breath. 18 g 1  . meclizine (ANTIVERT) 25 MG tablet Take 1 tablet (25 mg total) by mouth 3 (three) times daily as needed for dizziness. 15 tablet 0  . nitroGLYCERIN (NITROSTAT) 0.4 MG SL tablet Place 1 tablet (0.4 mg total) under the tongue every 5 (five) minutes as needed for chest pain. 100 tablet 3  . pantoprazole (PROTONIX) 40 MG tablet TAKE 1 TABLET EVERY DAY BEFORE BREAKFAST (Patient taking differently: Take 40 mg by mouth daily. ) 90 tablet 1  . UNABLE TO FIND 3 in 1 bedside commode  Dx I63.9, M62.81 (Patient not taking: Reported on 06/15/2017) 1 each 0    Results for orders placed or performed during the hospital encounter of 06/25/17 (from the past 48 hour(s))  Glucose, capillary     Status: None   Collection Time: 06/25/17  8:38 AM  Result Value Ref Range   Glucose-Capillary 82 65 - 99 mg/dL   No results found.  ROS  Blood pressure (!) 167/84, pulse 84, temperature 98 F (36.7 C), temperature source Oral, resp. rate (!) 22, height 5\' 10"  (1.778 m), weight 250 lb (113.4 kg), SpO2 93 %. Physical Exam  Constitutional: He appears well-developed and well-nourished.   HENT:  Mouth/Throat: Oropharynx is clear and moist.  Eyes: Conjunctivae are normal. No scleral icterus.  Neck: No thyromegaly present.  Cardiovascular: Normal rate, regular rhythm and normal heart sounds.  No murmur heard. Respiratory:  Diminished intensity of breath sounds bilaterally.  No rales or rhonchi noted.  GI:  Abdomen is full with small umbilical hernia.  It is completely reducible.  Long vertical scar in right lower quadrant.  Abdomen is soft and nontender without organomegaly or masses.  Musculoskeletal: He exhibits edema.  Trace edema around ankles.  Lymphadenopathy:    He has no cervical adenopathy.  Neurological: He is alert.  Skin: Skin is warm and dry.     Assessment/Plan History of sessile serrated polyp. Surveillance colonoscopy.  Hildred Laser, MD 06/25/2017, 9:06 AM

## 2017-06-26 ENCOUNTER — Other Ambulatory Visit: Payer: Self-pay

## 2017-06-26 NOTE — Patient Outreach (Signed)
[facility]) Care Management  [place]  06/26/2017   [name] 09-25-1940 [id number]  Subjective: Telephone call to patient for monthly call.  Patient reports he is doing good.  Patient reports going for colonoscopy but he did not have it done as he was not cleaned out enough.  Patient reports that it will be rescheduled.  Patient reports that his breathing is good.  He states he is wearing his oxygen all the time now.  Discussed with patient COPD signs and when to seek assistance.  He verbalized understanding.    Objective:   Encounter Medications:  Outpatient Encounter Medications as of 06/26/2017  Medication Sig  . acetaminophen (TYLENOL) 500 MG tablet Take 1,000 mg by mouth every 6 (six) hours as needed for moderate pain (left thigh).   Marland Kitchen albuterol (PROVENTIL HFA;VENTOLIN HFA) 108 (90 Base) MCG/ACT inhaler Inhale 2 puffs into the lungs every 4 (four) hours as needed for wheezing or shortness of breath.  . allopurinol (ZYLOPRIM) 300 MG tablet TAKE 1 TABLET ONE TIME DAILY (Patient taking differently: TAKE 300 MG TABLET ONE TIME DAILY)  . aspirin EC 81 MG tablet Take 81 mg by mouth daily.  . clopidogrel (PLAVIX) 75 MG tablet Take 1 tablet (75 mg total) by mouth daily.  . ergocalciferol (VITAMIN D2) 50000 units capsule Take 1 capsule (50,000 Units total) by mouth once a week. One capsule once weekly (Patient taking differently: Take 50,000 Units by mouth every Monday. )  . ezetimibe (ZETIA) 10 MG tablet Take 1 tablet (10 mg total) by mouth daily.  . ferrous sulfate 325 (65 FE) MG tablet Take 1 tablet (325 mg total) by mouth 3 (three) times daily with meals.  . insulin NPH-regular Human (NOVOLIN 70/30 RELION) (70-30) 100 UNIT/ML injection INJECT20 UNITS INTO SKIN WITH BREAKFAST AND SUPPER WHEN GLUCOSE ID ABOVE 90 (Patient taking differently: Inject 20 Units into the skin 2 (two) times daily. WHEN GLUCOSE ID ABOVE 90)  . levETIRAcetam (KEPPRA) 500 MG tablet Take  500 mg by mouth daily.  Marland Kitchen levothyroxine (SYNTHROID, LEVOTHROID) 88 MCG tablet Take 1 tablet (88 mcg total) by mouth every morning.  Marland Kitchen losartan (COZAAR) 50 MG tablet Take 50 mg by mouth daily.  . meclizine (ANTIVERT) 25 MG tablet Take 1 tablet (25 mg total) by mouth 3 (three) times daily as needed for dizziness.  . metoprolol tartrate (LOPRESSOR) 50 MG tablet Take 0.5 tablets (25 mg total) by mouth 2 (two) times daily.  . nitroGLYCERIN (NITROSTAT) 0.4 MG SL tablet Place 1 tablet (0.4 mg total) under the tongue every 5 (five) minutes as needed for chest pain.  . OXYGEN Inhale 2 L into the lungs continuous.  . pantoprazole (PROTONIX) 40 MG tablet TAKE 1 TABLET EVERY DAY BEFORE BREAKFAST (Patient taking differently: Take 40 mg by mouth daily. )  . pravastatin (PRAVACHOL) 40 MG tablet Take 2 tablets (80 mg total) by mouth daily. (Patient taking differently: Take 40 mg by mouth 2 (two) times daily. )  . senna (SENOKOT) 8.6 MG tablet Take 1-2 tablets by mouth daily as needed for constipation.   Marland Kitchen spironolactone (ALDACTONE) 25 MG tablet Take 1 tablet (25 mg total) by mouth daily.  Marland Kitchen tiotropium (SPIRIVA HANDIHALER) 18 MCG inhalation capsule Place 1 capsule (18 mcg total) into inhaler and inhale daily. (Patient taking differently: Place 18 mcg into inhaler and inhale daily as needed (for shortness of breath or wheezing). )  . triamcinolone cream (KENALOG) 0.1 % Apply 1 application topically 2 (  two) times daily. Apply twice daily to rash on right elbow  , under a moist wrap for 4 weeks (Patient taking differently: Apply 1 application topically 2 (two) times daily as needed (for affected area of skin on elbow). Apply twice daily to rash on right elbow  , under a moist wrap for 4 weeks)  . vitamin B-12 (CYANOCOBALAMIN) 1000 MCG tablet Take 1,000 mcg by mouth daily.   No facility-administered encounter medications on file as of 06/26/2017.     Functional Status:  In your present state of health, do you have any  difficulty performing the following activities: 06/26/2017 05/26/2017  Hearing? [name]  Vision? N N  Difficulty concentrating or making decisions? Y Y  Comment - -  Walking or climbing stairs? Y Y  Dressing or bathing? Y -  Doing errands, shopping? [place] and eating ? N N  Using the Toilet? N N  In the past six months, have you accidently leaked urine? N N  Do you have problems with loss of bowel control? N N  Managing your Medications? Y Y  Managing your Finances? [name]  Housekeeping or managing your Housekeeping? Y Y  Some recent data might be hidden    Fall/Depression Screening: Fall Risk  06/26/2017 06/04/2017 05/26/2017  Falls in the past year? Yes Yes Yes  Comment - - -  Number falls in past yr: 1 2 or more 2 or more  Comment - - -  Injury with Fall? - Yes -  Comment - - -  Risk Factor Category  - - -  Risk for fall due to : - Impaired mobility;Impaired balance/gait -  Risk for fall due to: Comment - - -  Follow up - Falls prevention discussed -   PHQ 2/9 Scores 06/26/2017 06/04/2017 05/26/2017 04/28/2017 03/04/2017 01/15/2017 12/26/2016  PHQ - 2 Score 0 0 0 0 0 0 0    Assessment: Patient continues to benefit from care manager outreach for disease management and support.    Plan:  [facility] CM Care Plan Problem One     Most Recent Value  Care Plan for Problem One  Not Active    [facility] CM Care Plan Problem Two     Most Recent Value  Care Plan Problem Two  COPD, knowledge deficit  Role Documenting the Problem Two  Care Management Telephonic Coordinator  Care Plan for Problem Two  Active  Interventions for Problem Two Long Term Goal   Patient able to describe symptoms of exacerbation.  Patient waering oxygen continuously. RN CM discussed when to seek help.    [facility] Long Term Goal  Patient will not have COPD exacerbation in the next 60 days  [facility] Long Term Goal Start Date  06/26/17 [goal continued]     RN CM will contact patient in the month of March and patient  agrees to next outreach.  Jone Baseman, RN, MSN [facility] Care Management Care Management Coordinator Direct Line [phone] Toll Free: [phone]  Fax: [phone]

## 2017-06-29 ENCOUNTER — Ambulatory Visit (INDEPENDENT_AMBULATORY_CARE_PROVIDER_SITE_OTHER): Payer: Medicare HMO

## 2017-06-29 VITALS — BP 160/70 | HR 79 | Temp 98.9°F | Ht 70.0 in | Wt 250.0 lb

## 2017-06-29 DIAGNOSIS — Z0000 Encounter for general adult medical examination without abnormal findings: Secondary | ICD-10-CM

## 2017-06-29 NOTE — Patient Instructions (Signed)
[name] , Thank you for taking time to come for your Medicare Wellness Visit. I appreciate your ongoing commitment to your health goals. Please review the following plan we discussed and let me know if I can assist you in the future.   Screening recommendations/referrals: Colonoscopy: Discuss your need for a colonoscopy with Dr. Moshe Cipro Recommended yearly ophthalmology/optometry visit for glaucoma screening and checkup Recommended yearly dental visit for hygiene and checkup  Vaccinations: Influenza vaccine: Due fall 2019 Pneumococcal vaccine: Due 2020 Tdap vaccine: Due 2028 Shingles vaccine: Please discuss this with Dr. Moshe Cipro. Also, call your insurance to see if they will cover the vaccine.    Advanced directives: Discussed today in office  Conditions/risks identified: Fall  Next appointment: 10/06/2017  Preventive Care 30 Years and Older, Male Preventive care refers to lifestyle choices and visits with your health care provider that can promote health and wellness. What does preventive care include?  A yearly physical exam. This is also called an annual well check.  Dental exams once or twice a year.  Routine eye exams. Ask your health care provider how often you should have your eyes checked.  Personal lifestyle choices, including:  Daily care of your teeth and gums.  Regular physical activity.  Eating a healthy diet.  Avoiding tobacco and drug use.  Limiting alcohol use.  Practicing safe sex.  Taking low doses of aspirin every day.  Taking vitamin and mineral supplements as recommended by your health care provider. What happens during an annual well check? The services and screenings done by your health care provider during your annual well check will depend on your age, overall health, lifestyle risk factors, and family history of disease. Counseling  Your health care provider may ask you questions about your:  Alcohol use.  Tobacco use.  Drug  use.  Emotional well-being.  Home and relationship well-being.  Sexual activity.  Eating habits.  History of falls.  Memory and ability to understand (cognition).  Work and work Statistician. Screening  You may have the following tests or measurements:  Height, weight, and BMI.  Blood pressure.  Lipid and cholesterol levels. These may be checked every 5 years, or more frequently if you are over 13 years old.  Skin check.  Lung cancer screening. You may have this screening every year starting at age 68 if you have a 30-pack-year history of smoking and currently smoke or have quit within the past 15 years.  Fecal occult blood test (FOBT) of the stool. You may have this test every year starting at age 31.  Flexible sigmoidoscopy or colonoscopy. You may have a sigmoidoscopy every 5 years or a colonoscopy every 10 years starting at age 79.  Prostate cancer screening. Recommendations will vary depending on your family history and other risks.  Hepatitis C blood test.  Hepatitis B blood test.  Sexually transmitted disease (STD) testing.  Diabetes screening. This is done by checking your blood sugar (glucose) after you have not eaten for a while (fasting). You may have this done every 1-3 years.  Abdominal aortic aneurysm (AAA) screening. You may need this if you are a current or former smoker.  Osteoporosis. You may be screened starting at age 73 if you are at high risk. Talk with your health care provider about your test results, treatment options, and if necessary, the need for more tests. Vaccines  Your health care provider may recommend certain vaccines, such as:  Influenza vaccine. This is recommended every year.  Tetanus, diphtheria, and  acellular pertussis (Tdap, Td) vaccine. You may need a Td booster every 10 years.  Zoster vaccine. You may need this after age 6.  Pneumococcal 13-valent conjugate (PCV13) vaccine. One dose is recommended after age  26.  Pneumococcal polysaccharide (PPSV23) vaccine. One dose is recommended after age 52. Talk to your health care provider about which screenings and vaccines you need and how often you need them. This information is not intended to replace advice given to you by your health care provider. Make sure you discuss any questions you have with your health care provider. Document Released: 06/01/2015 Document Revised: 01/23/2016 Document Reviewed: 03/06/2015 Elsevier Interactive Patient Education  2017 [place] Prevention in the Home Falls can cause injuries. They can happen to people of all ages. There are many things you can do to make your home safe and to help prevent falls. What can I do on the outside of my home?  Regularly fix the edges of walkways and driveways and fix any cracks.  Remove anything that might make you trip as you walk through a door, such as a raised step or threshold.  Trim any bushes or trees on the path to your home.  Use bright outdoor lighting.  Clear any walking paths of anything that might make someone trip, such as rocks or tools.  Regularly check to see if handrails are loose or broken. Make sure that both sides of any steps have handrails.  Any raised decks and porches should have guardrails on the edges.  Have any leaves, snow, or ice cleared regularly.  Use sand or salt on walking paths during winter.  Clean up any spills in your garage right away. This includes oil or grease spills. What can I do in the bathroom?  Use night lights.  Install grab bars by the toilet and in the tub and shower. Do not use towel bars as grab bars.  Use non-skid mats or decals in the tub or shower.  If you need to sit down in the shower, use a plastic, non-slip stool.  Keep the floor dry. Clean up any water that spills on the floor as soon as it happens.  Remove soap buildup in the tub or shower regularly.  Attach bath mats securely with double-sided  non-slip rug tape.  Do not have throw rugs and other things on the floor that can make you trip. What can I do in the bedroom?  Use night lights.  Make sure that you have a light by your bed that is easy to reach.  Do not use any sheets or blankets that are too big for your bed. They should not hang down onto the floor.  Have a firm chair that has side arms. You can use this for support while you get dressed.  Do not have throw rugs and other things on the floor that can make you trip. What can I do in the kitchen?  Clean up any spills right away.  Avoid walking on wet floors.  Keep items that you use a lot in easy-to-reach places.  If you need to reach something above you, use a strong step stool that has a grab bar.  Keep electrical cords out of the way.  Do not use floor polish or wax that makes floors slippery. If you must use wax, use non-skid floor wax.  Do not have throw rugs and other things on the floor that can make you trip. What can I do with my stairs?  Do not leave any items on the stairs.  Make sure that there are handrails on both sides of the stairs and use them. Fix handrails that are broken or loose. Make sure that handrails are as long as the stairways.  Check any carpeting to make sure that it is firmly attached to the stairs. Fix any carpet that is loose or worn.  Avoid having throw rugs at the top or bottom of the stairs. If you do have throw rugs, attach them to the floor with carpet tape.  Make sure that you have a light switch at the top of the stairs and the bottom of the stairs. If you do not have them, ask someone to add them for you. What else can I do to help prevent falls?  Wear shoes that:  Do not have high heels.  Have rubber bottoms.  Are comfortable and fit you well.  Are closed at the toe. Do not wear sandals.  If you use a stepladder:  Make sure that it is fully opened. Do not climb a closed stepladder.  Make sure that both  sides of the stepladder are locked into place.  Ask someone to hold it for you, if possible.  Clearly mark and make sure that you can see:  Any grab bars or handrails.  First and last steps.  Where the edge of each step is.  Use tools that help you move around (mobility aids) if they are needed. These include:  Canes.  Walkers.  Scooters.  Crutches.  Turn on the lights when you go into a dark area. Replace any light bulbs as soon as they burn out.  Set up your furniture so you have a clear path. Avoid moving your furniture around.  If any of your floors are uneven, fix them.  If there are any pets around you, be aware of where they are.  Review your medicines with your doctor. Some medicines can make you feel dizzy. This can increase your chance of falling. Ask your doctor what other things that you can do to help prevent falls. This information is not intended to replace advice given to you by your health care provider. Make sure you discuss any questions you have with your health care provider. Document Released: 03/01/2009 Document Revised: 10/11/2015 Document Reviewed: 06/09/2014 Elsevier Interactive Patient Education  2017 [place] for Adults  A healthy lifestyle and preventive care can promote health and wellness. Preventive health guidelines for adults include the following key practices.  . A routine yearly physical is a good way to check with your health care provider about your health and preventive screening. It is a chance to share any concerns and updates on your health and to receive a thorough exam.  . Visit your dentist for a routine exam and preventive care every 6 months. Brush your teeth twice a day and floss once a day. Good oral hygiene prevents tooth decay and gum disease.  . The frequency of eye exams is based on your age, health, family medical history, use  of contact lenses, and other factors. Follow your health care  provider's ecommendations for frequency of eye exams.  . Eat a healthy diet. Foods like vegetables, fruits, whole grains, low-fat dairy products, and lean protein foods contain the nutrients you need without too many calories. Decrease your intake of foods high in solid fats, added sugars, and salt. Eat the right amount of calories for you. Get information about a proper diet from  your health care provider, if necessary.  . Regular physical exercise is one of the most important things you can do for your health. Most adults should get at least 150 minutes of moderate-intensity exercise (any activity that increases your heart rate and causes you to sweat) each week. In addition, most adults need muscle-strengthening exercises on 2 or more days a week.  Silver Sneakers may be a benefit available to you. To determine eligibility, you may visit the website: www.silversneakers.com or contact program at (928)373-8228 Mon-Fri between 8AM-8PM.   . Maintain a healthy weight. The body mass index (BMI) is a screening tool to identify possible weight problems. It provides an estimate of body fat based on height and weight. Your health care provider can find your BMI and can help you achieve or maintain a healthy weight.   For adults 20 years and older: ? A BMI below 18.5 is considered underweight. ? A BMI of 18.5 to 24.9 is normal. ? A BMI of 25 to 29.9 is considered overweight. ? A BMI of 30 and above is considered obese.   . Maintain normal blood lipids and cholesterol levels by exercising and minimizing your intake of saturated fat. Eat a balanced diet with plenty of fruit and vegetables. Blood tests for lipids and cholesterol should begin at age 62 and be repeated every 5 years. If your lipid or cholesterol levels are high, you are over 50, or you are at high risk for heart disease, you may need your cholesterol levels checked more frequently. Ongoing high lipid and cholesterol levels should be treated  with medicines if diet and exercise are not working.  . If you smoke, find out from your health care provider how to quit. If you do not use tobacco, please do not start.  . If you choose to drink alcohol, please do not consume more than 2 drinks per day. One drink is considered to be 12 ounces (355 mL) of beer, 5 ounces (148 mL) of wine, or 1.5 ounces (44 mL) of liquor.  . If you are 65-64 years old, ask your health care provider if you should take aspirin to prevent strokes.  . Use sunscreen. Apply sunscreen liberally and repeatedly throughout the day. You should seek shade when your shadow is shorter than you. Protect yourself by wearing long sleeves, pants, a wide-brimmed hat, and sunglasses year round, whenever you are outdoors.  . Once a month, do a whole body skin exam, using a mirror to look at the skin on your back. Tell your health care provider of new moles, moles that have irregular borders, moles that are larger than a pencil eraser, or moles that have changed in shape or color.

## 2017-06-29 NOTE — Progress Notes (Signed)
Subjective:   [name] is a 77 y.o. male who presents for Medicare Annual/Subsequent preventive examination.  Review of Systems:   Cardiac Risk Factors include: advanced age (>56men, >73 women);diabetes mellitus;dyslipidemia;hypertension;male gender;obesity (BMI >30kg/m2);sedentary lifestyle;smoking/ tobacco exposure     Objective:    Vitals: BP (!) 160/70 (BP Location: Left Arm, Patient Position: Sitting, Cuff Size: Normal)   Pulse 79   Temp 98.9 F (37.2 C) (Temporal)   Ht 5\' 10"  (1.778 m)   Wt 250 lb (113.4 kg)   SpO2 92%   BMI 35.87 kg/m   Body mass index is 35.87 kg/m.  Advanced Directives 06/29/2017 06/26/2017 06/25/2017 05/07/2017 03/06/2017 10/28/2016 10/07/2016  Does Patient Have a Medical Advance Directive? No No No No No No No  Does patient want to make changes to medical advance directive? - - - - - - -  Would patient like information on creating a medical advance directive? No - Patient declined No - Patient declined No - Patient declined No - Patient declined - No - Patient declined No - Patient declined  Pre-existing out of facility DNR order (yellow form or pink MOST form) - - - - - - -    Tobacco Social History   Tobacco Use  Smoking Status Former Smoker  . Packs/day: 0.50  . Years: 33.00  . Pack years: 16.50  . Types: Cigarettes  . Last attempt to quit: 11/29/2002  . Years since quitting: 14.5  Smokeless Tobacco Never Used     Counseling given: Not Answered   Clinical Intake:  Pre-visit preparation completed: Yes  Pain : No/denies pain Pain Score: 0-No pain     Diabetes: Yes  How often do you need to have someone help you when you read instructions, pamphlets, or other written materials from your doctor or pharmacy?: 1 - Never  Interpreter Needed?: No     Past Medical History:  Diagnosis Date  . Abnormality of gait 05/01/2015  . Arthritis    "left leg; knees" (05/07/2017)  . Arthritis of knee   . CAD (coronary artery disease)     . Chronic bronchitis ([place])    "get it q yr"  . Chronic lower back pain    "since I was a teen" (05/07/2017)  . COPD (chronic obstructive pulmonary disease) ([place])   . CVA (cerebral vascular accident) ([place]) 05/2015   R sided weakness (05/07/2017)  . GERD (gastroesophageal reflux disease)   . Gout   . Hyperlipidemia   . Hypertension   . Myocardial infarction ([place]) 1999  . Obesity   . OSA on CPAP   . Pneumonia    "a few times; last time was ~ 06/2013" (05/07/2017)  . Psoriasis   . Tussive syncope 05/01/2015  . Type II diabetes mellitus ([place])   . Vitamin B12 deficiency 05/01/2015   Past Surgical History:  Procedure Laterality Date  . APPENDECTOMY    . CATARACT EXTRACTION W/PHACO Left 10/07/2016   Procedure: CATARACT EXTRACTION PHACO AND INTRAOCULAR LENS PLACEMENT (IOC);  Surgeon: Rutherford Guys, MD;  Location: [facility];  Service: Ophthalmology;  Laterality: Left;  CDE: 13.49  . CATARACT EXTRACTION W/PHACO Right 10/28/2016   Procedure: CATARACT EXTRACTION PHACO AND INTRAOCULAR LENS PLACEMENT (IOC);  Surgeon: Rutherford Guys, MD;  Location: [facility];  Service: Ophthalmology;  Laterality: Right;  CDE: 16.71  . COLONOSCOPY  03/17/2012   Procedure: COLONOSCOPY;  Surgeon: Rogene Houston, MD;  Location: [facility];  Service: Endoscopy;  Laterality: N/A;  830  . CORONARY  [place]- 2009-08/30/2013   "counting today's, I have 5 stents" (08/30/2013)  . CORONARY ANGIOPLASTY WITH STENT PLACEMENT  05/07/2017  . CORONARY STENT INTERVENTION N/A 05/07/2017   Procedure: CORONARY STENT INTERVENTION;  Surgeon: Charolette Forward, MD;  Location: [place] CV LAB;  Service: Cardiovascular;  Laterality: N/A;  . CYSTOSCOPY WITH URETHRAL DILATATION N/A 11/26/2012   Procedure: CYSTOSCOPY WITH URETHRAL DILATATION;  Surgeon: Malka So, MD;  Location: [facility];  Service: Urology;  Laterality: N/A;  . LEFT HEART CATH AND CORONARY ANGIOGRAPHY N/A 05/07/2017   Procedure: LEFT HEART CATH AND  CORONARY ANGIOGRAPHY;  Surgeon: Charolette Forward, MD;  Location: [place] [facility];  Service: Cardiovascular;  Laterality: N/A;  . LEFT HEART CATHETERIZATION WITH CORONARY ANGIOGRAM N/A 08/30/2013   Procedure: LEFT HEART CATHETERIZATION WITH CORONARY ANGIOGRAM;  Surgeon: Clent Demark, MD;  Location: [place] CATH LAB;  Service: Cardiovascular;  Laterality: N/A;  . PERCUTANEOUS CORONARY STENT INTERVENTION (PCI-S) Right 08/30/2013   Procedure: PERCUTANEOUS CORONARY STENT INTERVENTION (PCI-S);  Surgeon: Clent Demark, MD;  Location[facility] CATH LAB;  Service: Cardiovascular;  Laterality: Right;   Family History  Problem Relation Age of Onset  . Hypertension Mother   . Diabetes Mother   . Hypertension Father   . Diabetes Brother   . Stroke Daughter   . Arthritis Unknown        Family History   . Diabetes Unknown        family History    Social History   Socioeconomic History  . Marital status: Married    Spouse name: None  . Number of children: 2  . Years of education: 66  . Highest education level: None  Social Needs  . Financial resource strain: Not hard at all  . Food insecurity - worry: Never true  . Food insecurity - inability: Never true  . Transportation needs - medical: No  . Transportation needs - non-medical: No  Occupational History  . Occupation: Brewing technologist   Tobacco Use  . Smoking status: Former Smoker    Packs/day: 0.50    Years: 33.00    Pack years: 16.50    Types: Cigarettes    Last attempt to quit: 11/29/2002    Years since quitting: 14.5  . Smokeless tobacco: Never Used  Substance and Sexual Activity  . Alcohol use: No  . Drug use: No  . Sexual activity: Not Currently  Other Topics Concern  . None  Social History Narrative   Patient drinks about 3 cups of soda daily.    Patient is left handed.     Outpatient Encounter Medications as of 06/29/2017  Medication Sig  . acetaminophen (TYLENOL) 500 MG tablet Take 1,000 mg by mouth every 6 (six) hours as  needed for moderate pain (left thigh).   Marland Kitchen albuterol (PROVENTIL HFA;VENTOLIN HFA) 108 (90 Base) MCG/ACT inhaler Inhale 2 puffs into the lungs every 4 (four) hours as needed for wheezing or shortness of breath.  . allopurinol (ZYLOPRIM) 300 MG tablet TAKE 1 TABLET ONE TIME DAILY (Patient taking differently: TAKE 300 MG TABLET ONE TIME DAILY)  . aspirin EC 81 MG tablet Take 81 mg by mouth daily.  . clopidogrel (PLAVIX) 75 MG tablet Take 1 tablet (75 mg total) by mouth daily.  . ergocalciferol (VITAMIN D2) 50000 units capsule Take 1 capsule (50,000 Units total) by mouth once a week. One capsule once weekly (Patient taking differently: Take 50,000 Units by mouth every Monday. )  . ezetimibe (  ZETIA) 10 MG tablet Take 1 tablet (10 mg total) by mouth daily.  . ferrous sulfate 325 (65 FE) MG tablet Take 1 tablet (325 mg total) by mouth 3 (three) times daily with meals.  . insulin NPH-regular Human (NOVOLIN 70/30 RELION) (70-30) 100 UNIT/ML injection INJECT20 UNITS INTO SKIN WITH BREAKFAST AND SUPPER WHEN GLUCOSE ID ABOVE 90 (Patient taking differently: Inject 20 Units into the skin 2 (two) times daily. WHEN GLUCOSE ID ABOVE 90)  . levETIRAcetam (KEPPRA) 500 MG tablet Take 500 mg by mouth daily.  Marland Kitchen levothyroxine (SYNTHROID, LEVOTHROID) 88 MCG tablet Take 1 tablet (88 mcg total) by mouth every morning.  Marland Kitchen losartan (COZAAR) 50 MG tablet Take 50 mg by mouth daily.  . meclizine (ANTIVERT) 25 MG tablet Take 1 tablet (25 mg total) by mouth 3 (three) times daily as needed for dizziness.  . metoprolol tartrate (LOPRESSOR) 50 MG tablet Take 0.5 tablets (25 mg total) by mouth 2 (two) times daily.  . nitroGLYCERIN (NITROSTAT) 0.4 MG SL tablet Place 1 tablet (0.4 mg total) under the tongue every 5 (five) minutes as needed for chest pain.  . OXYGEN Inhale 2 L into the lungs continuous.  . pantoprazole (PROTONIX) 40 MG tablet TAKE 1 TABLET EVERY DAY BEFORE BREAKFAST (Patient taking differently: Take 40 mg by mouth daily. )   . pravastatin (PRAVACHOL) 40 MG tablet Take 2 tablets (80 mg total) by mouth daily. (Patient taking differently: Take 40 mg by mouth 2 (two) times daily. )  . senna (SENOKOT) 8.6 MG tablet Take 1-2 tablets by mouth daily as needed for constipation.   Marland Kitchen spironolactone (ALDACTONE) 25 MG tablet Take 1 tablet (25 mg total) by mouth daily.  Marland Kitchen tiotropium (SPIRIVA HANDIHALER) 18 MCG inhalation capsule Place 1 capsule (18 mcg total) into inhaler and inhale daily. (Patient taking differently: Place 18 mcg into inhaler and inhale daily as needed (for shortness of breath or wheezing). )  . vitamin B-12 (CYANOCOBALAMIN) 1000 MCG tablet Take 1,000 mcg by mouth daily.  Marland Kitchen triamcinolone cream (KENALOG) 0.1 % Apply 1 application topically 2 (two) times daily. Apply twice daily to rash on right elbow  , under a moist wrap for 4 weeks (Patient not taking: Reported on 06/29/2017)   No facility-administered encounter medications on file as of 06/29/2017.     Activities of Daily Living In your present state of health, do you have any difficulty performing the following activities: 06/29/2017 06/26/2017  Hearing? [name]  Vision? Y N  Difficulty concentrating or making decisions? Y Y  Comment - -  Walking or climbing stairs? Y Y  Dressing or bathing? Y Y  Doing errands, shopping? [place] and eating ? Y N  Using the Toilet? N N  In the past six months, have you accidently leaked urine? Y N  Do you have problems with loss of bowel control? Y N  Managing your Medications? Y Y  Managing your Finances? N Y  Housekeeping or managing your Housekeeping? [name]  Some recent data might be hidden    Patient Care Team: Fayrene Helper, MD as PCP - Wardell Honour, MD as Consulting Physician (Pulmonary Disease) Charolette Forward, MD as Consulting Physician (Cardiology) Rutherford Guys, MD as Consulting Physician (Ophthalmology) Kathrynn Ducking, MD as Consulting Physician (Neurology) [name], RN as [place] Management Standley Brooking, LCSW as [place] (Licensed Clinical Social Worker)   Assessment:  This is a routine wellness examination for [name].  Exercise Activities and Dietary recommendations Current Exercise Habits: The patient has a physically strenous job, but has no regular exercise apart from work., Exercise limited by: orthopedic condition(s);respiratory conditions(s)  Goals    . <enter goal here> (pt-stated)     "Live and let live"     . Exercise 3x per week (30 min per time)     Recommend starting chair exercises 3 times a week for at least 30 minutes at a time as tolerated.       Fall Risk Fall Risk  06/29/2017 06/26/2017 06/04/2017 05/26/2017 04/28/2017  Falls in the past year? Yes Yes Yes Yes Yes  Comment - - - - -  Number falls in past yr: 2 or more 1 2 or more 2 or more 2 or more  Comment - - - - -  Injury with Fall? Yes - Yes - Yes  Comment - - - - -  Risk Factor Category  High Fall Risk - - - High Fall Risk  Risk for fall due to : Impaired balance/gait;Impaired mobility;History of fall(s) - Impaired mobility;Impaired balance/gait - -  Risk for fall due to: Comment - - - - -  Follow up - - Falls prevention discussed - -   Is the patient's home free of loose throw rugs in walkways, pet beds, electrical cords, etc?   no      Grab bars in the bathroom? no      Handrails on the stairs?   no      Adequate lighting?   yes    Depression Screen PHQ 2/9 Scores 06/29/2017 06/26/2017 06/04/2017 05/26/2017  PHQ - 2 Score 0 0 0 0    Cognitive Function MMSE - Mini Mental State Exam 05/01/2015  Orientation to time 5  Orientation to Place 4  Registration 3  Attention/ Calculation 5  Recall 2  Language- name 2 objects 2  Language- repeat 1  Language- follow 3 step command 3  Language- read & follow direction 1  Write a sentence 1  Copy design 0  Total score 27     6CIT Screen 07/01/2016  What  Year? 0 points  What month? 0 points  What time? 0 points  Count back from 20 0 points  Months in reverse 0 points  Repeat phrase 0 points  Total Score 0    Immunization History  Administered Date(s) Administered  . Influenza Whole 03/18/2005  . Influenza,inj,Quad PF,6+ Mos 03/19/2017  . Pneumococcal Conjugate-13 12/08/2013  . Pneumococcal Polysaccharide-23 06/09/2012  . Td 12/04/2003  . Tdap 03/06/2017    Qualifies for Shingles Vaccine? Yes, he has been advised to discuss with his PCP the need for this. He has also been advised to contact his insurance for coverage information.  Screening Tests Health Maintenance  Topic Date Due  . OPHTHALMOLOGY EXAM  09/30/2017  . HEMOGLOBIN A1C  11/05/2017  . FOOT EXAM  12/01/2017  . TETANUS/TDAP  03/07/2027  . COLONOSCOPY  06/26/2027  . INFLUENZA VACCINE  Completed  . PNA vac Low Risk Adult  Completed   Cancer Screenings: Lung: Low Dose CT Chest recommended if Age 41-80 years, 30 pack-year currently smoking OR have quit w/in 15years. Patient does qualify. Colorectal: He will discuss this with PCP/GI   Plan:     I have personally reviewed and noted the following in the patient's chart:   . Medical and social history . Use of alcohol, tobacco or illicit drugs  .  Current medications and supplements . Functional ability and status . Nutritional status . Physical activity . Advanced directives . List of other physicians . Hospitalizations, surgeries, and ER visits in previous 12 months . Vitals . Screenings to include cognitive, depression, and falls . Referrals and appointments  In addition, I have reviewed and discussed with patient certain preventive protocols, quality metrics, and best practice recommendations. A written personalized care plan for preventive services as well as general preventive health recommendations were provided to patient.     Merceda Elks, LPN  07/07/7586

## 2017-06-30 ENCOUNTER — Ambulatory Visit: Payer: Self-pay

## 2017-07-02 ENCOUNTER — Other Ambulatory Visit: Payer: Self-pay | Admitting: *Deleted

## 2017-07-03 NOTE — Patient Outreach (Signed)
[place] [facility]) Care Management  07/03/2017  KYLIE GROS December 28, 1940 [id number]   CSW attempted to reach patient to follow-up on resources provided during initial home visit, but no answer - phone just kept ringing. CSW will make 2nd attempt to reach patient on Monday, 2/18.    Raynaldo Opitz, LCSW [facility]  Clinical Social Worker cell #: [phone]

## 2017-07-06 ENCOUNTER — Other Ambulatory Visit: Payer: Self-pay | Admitting: *Deleted

## 2017-07-07 NOTE — Patient Outreach (Signed)
[place] [facility]) Care Management  07/07/2017  LEMOINE GOYNE 15-May-1941 [id number]   CSW called & spoke with patient's wife, [name] to follow-up on community resources. Patient's wife states that she's not had a chance to get in touch with [facility] or [facility] group about bathroom modifications. CSW also asked patient's wife about advance directives, wife states that they've ready over it & filled it out but just needs to go signed & notarized. CSW will follow-up in 1 month.    Raynaldo Opitz, LCSW [facility]  Clinical Social Worker cell #: [phone]

## 2017-07-09 DIAGNOSIS — I5030 Unspecified diastolic (congestive) heart failure: Secondary | ICD-10-CM | POA: Diagnosis not present

## 2017-07-09 DIAGNOSIS — Z8673 Personal history of transient ischemic attack (TIA), and cerebral infarction without residual deficits: Secondary | ICD-10-CM | POA: Diagnosis not present

## 2017-07-09 DIAGNOSIS — J441 Chronic obstructive pulmonary disease with (acute) exacerbation: Secondary | ICD-10-CM | POA: Diagnosis not present

## 2017-07-09 DIAGNOSIS — G4733 Obstructive sleep apnea (adult) (pediatric): Secondary | ICD-10-CM | POA: Diagnosis not present

## 2017-07-23 ENCOUNTER — Other Ambulatory Visit: Payer: Self-pay

## 2017-07-23 NOTE — Patient Outreach (Signed)
[place] [facility]) Care Management  07/23/2017  [name] [birth year] [id number]   Telephone call to patient for monthly call. No answer, Unable to leave a message.  Plan: RN CM will send letter and attempt patient again within 5 business days.  Jone Baseman, RN, MSN [facility] Care Management Care Management Coordinator Direct Line [phone] Toll Free: [phone]  Fax: [phone]

## 2017-07-27 ENCOUNTER — Other Ambulatory Visit: Payer: Self-pay

## 2017-07-27 NOTE — Patient Outreach (Signed)
[place] [facility]) Care Management  [place]  07/27/2017   [name] 08-06-1940 [id number]  Subjective: Telephone call to patient for monthly call. Patient able to verify HIPAA. Patient reports that he is doing ok.  Patient noted to have some wheezing on the phone. Patient acknowledged that he did have a cold.  He states he has not had one in a while.  Patient states that he is getting better.  He states he has not been coughing as much.  Patient reports he is using his inhalers as needed and using his oxygen.  Discussed with patient worsening cold and notifying physician.  He verbalized understanding.  Patient reports a fall recently.  He states that he went into the part of the home where they are remodeling and got tangled up in the broom and dust pan. He states he was able to get up on his own and no injuries.  Advised patient on staying out of area being remodeled and being careful. He verbalized understanding.  Discussed with patient case closure next month.  Patient states he will miss my calls. Explained to patient that he has done well in managing his COPD.  He verbalized understanding.  Objective:   Encounter Medications:  Outpatient Encounter Medications as of 07/27/2017  Medication Sig  . acetaminophen (TYLENOL) 500 MG tablet Take 1,000 mg by mouth every 6 (six) hours as needed for moderate pain (left thigh).   Marland Kitchen albuterol (PROVENTIL HFA;VENTOLIN HFA) 108 (90 Base) MCG/ACT inhaler Inhale 2 puffs into the lungs every 4 (four) hours as needed for wheezing or shortness of breath.  . allopurinol (ZYLOPRIM) 300 MG tablet TAKE 1 TABLET ONE TIME DAILY (Patient taking differently: TAKE 300 MG TABLET ONE TIME DAILY)  . aspirin EC 81 MG tablet Take 81 mg by mouth daily.  . clopidogrel (PLAVIX) 75 MG tablet Take 1 tablet (75 mg total) by mouth daily.  . ergocalciferol (VITAMIN D2) 50000 units capsule Take 1 capsule (50,000 Units total) by mouth once a week. One capsule  once weekly (Patient taking differently: Take 50,000 Units by mouth every Monday. )  . ezetimibe (ZETIA) 10 MG tablet Take 1 tablet (10 mg total) by mouth daily.  . ferrous sulfate 325 (65 FE) MG tablet Take 1 tablet (325 mg total) by mouth 3 (three) times daily with meals.  . insulin NPH-regular Human (NOVOLIN 70/30 RELION) (70-30) 100 UNIT/ML injection INJECT20 UNITS INTO SKIN WITH BREAKFAST AND SUPPER WHEN GLUCOSE ID ABOVE 90 (Patient taking differently: Inject 20 Units into the skin 2 (two) times daily. WHEN GLUCOSE ID ABOVE 90)  . levETIRAcetam (KEPPRA) 500 MG tablet Take 500 mg by mouth daily.  Marland Kitchen levothyroxine (SYNTHROID, LEVOTHROID) 88 MCG tablet Take 1 tablet (88 mcg total) by mouth every morning.  Marland Kitchen losartan (COZAAR) 50 MG tablet Take 50 mg by mouth daily.  . meclizine (ANTIVERT) 25 MG tablet Take 1 tablet (25 mg total) by mouth 3 (three) times daily as needed for dizziness.  . metoprolol tartrate (LOPRESSOR) 50 MG tablet Take 0.5 tablets (25 mg total) by mouth 2 (two) times daily.  . nitroGLYCERIN (NITROSTAT) 0.4 MG SL tablet Place 1 tablet (0.4 mg total) under the tongue every 5 (five) minutes as needed for chest pain.  . OXYGEN Inhale 2 L into the lungs continuous.  . pantoprazole (PROTONIX) 40 MG tablet TAKE 1 TABLET EVERY DAY BEFORE BREAKFAST (Patient taking differently: Take 40 mg by mouth daily. )  . pravastatin (PRAVACHOL) 40 MG tablet  Take 2 tablets (80 mg total) by mouth daily. (Patient taking differently: Take 40 mg by mouth 2 (two) times daily. )  . senna (SENOKOT) 8.6 MG tablet Take 1-2 tablets by mouth daily as needed for constipation.   Marland Kitchen spironolactone (ALDACTONE) 25 MG tablet Take 1 tablet (25 mg total) by mouth daily.  Marland Kitchen tiotropium (SPIRIVA HANDIHALER) 18 MCG inhalation capsule Place 1 capsule (18 mcg total) into inhaler and inhale daily. (Patient taking differently: Place 18 mcg into inhaler and inhale daily as needed (for shortness of breath or wheezing). )  . vitamin  B-12 (CYANOCOBALAMIN) 1000 MCG tablet Take 1,000 mcg by mouth daily.  Marland Kitchen triamcinolone cream (KENALOG) 0.1 % Apply 1 application topically 2 (two) times daily. Apply twice daily to rash on right elbow  , under a moist wrap for 4 weeks (Patient not taking: Reported on 06/29/2017)   No facility-administered encounter medications on file as of 07/27/2017.     Functional Status:  In your present state of health, do you have any difficulty performing the following activities: 06/29/2017 06/26/2017  Hearing? [name]  Vision? Y N  Difficulty concentrating or making decisions? Y Y  Comment - -  Walking or climbing stairs? Y Y  Dressing or bathing? Y Y  Doing errands, shopping? [place] and eating ? Y N  Using the Toilet? N N  In the past six months, have you accidently leaked urine? Y N  Do you have problems with loss of bowel control? Y N  Managing your Medications? Y Y  Managing your Finances? N Y  Housekeeping or managing your Housekeeping? Y Y  Some recent data might be hidden    Fall/Depression Screening: Fall Risk  06/29/2017 06/26/2017 06/04/2017  Falls in the past year? Yes Yes Yes  Comment - - -  Number falls in past yr: 2 or more 1 2 or more  Comment - - -  Injury with Fall? Yes - Yes  Comment - - -  Risk Factor Category  High Fall Risk - -  Risk for fall due to : Impaired balance/gait;Impaired mobility;History of fall(s) - Impaired mobility;Impaired balance/gait  Risk for fall due to: Comment - - -  Follow up - - Falls prevention discussed   PHQ 2/9 Scores 06/29/2017 06/26/2017 06/04/2017 05/26/2017 04/28/2017 03/04/2017 01/15/2017  PHQ - 2 Score 0 0 0 0 0 0 0    Assessment: Patient continues to benefit from care manager outreach for disease management and support.   Plan:  [facility] CM Care Plan Problem One     Most Recent Value  Care Plan for Problem One  Not Active    [facility] CM Care Plan Problem Two     Most Recent Value  Care Plan Problem Two  COPD, knowledge  deficit  Role Documenting the Problem Two  Care Management Telephonic Coordinator  Care Plan for Problem Two  Active  Interventions for Problem Two Long Term Goal   RN CM discussed with patient sign of COPD exacerbation.  Patient waering oxygen continuously. RN CM discussed when to seek help.    [facility] Long Term Goal  Patient will not have COPD exacerbation in the next 60 days  [facility] Long Term Goal Start Date  06/26/17 [goal continued]     RN CM will contact patient in the month of April and patient agrees to next outreach.  Jone Baseman, RN, MSN [facility] Care Management Coordinator Direct Line [phone]  Toll Free: [phone]  Fax: [phone]

## 2017-08-04 ENCOUNTER — Other Ambulatory Visit: Payer: Self-pay | Admitting: *Deleted

## 2017-08-04 NOTE — Patient Outreach (Signed)
[place] [facility]) Care Management  08/04/2017  MAL ASHER Feb 02, 1941 [id number]   CSW attempted to reach patient to follow-up on community resources & advance directives, but no answer on patient's home # & cellphone. CSW left HIPPA compliant voicemail on patient's cellphone as home phone just kept ringing. CSW will await returned call or will try again tomorrow.    Raynaldo Opitz, LCSW [facility]  Clinical Social Worker cell #: [phone]

## 2017-08-06 ENCOUNTER — Other Ambulatory Visit: Payer: Self-pay | Admitting: *Deleted

## 2017-08-06 DIAGNOSIS — G4733 Obstructive sleep apnea (adult) (pediatric): Secondary | ICD-10-CM | POA: Diagnosis not present

## 2017-08-06 DIAGNOSIS — I5030 Unspecified diastolic (congestive) heart failure: Secondary | ICD-10-CM | POA: Diagnosis not present

## 2017-08-06 DIAGNOSIS — J441 Chronic obstructive pulmonary disease with (acute) exacerbation: Secondary | ICD-10-CM | POA: Diagnosis not present

## 2017-08-06 DIAGNOSIS — Z8673 Personal history of transient ischemic attack (TIA), and cerebral infarction without residual deficits: Secondary | ICD-10-CM | POA: Diagnosis not present

## 2017-08-06 NOTE — Patient Outreach (Signed)
[place] [facility] [facility]) Care Management  08/06/2017  [name] May 08, 1941 [id number]   CSW made a second attempt to try and contact patient/wife today to follow-up on resources. A HIPPA compliant message was left for patient on voicemail (ph#: [phone]). CSW is currently awaiting a return call. CSW will send unsuccessful outreach letter to patient & make a third outreach attempt in 10 days, if CSW does not receive a return call from patient in the meantime.    Raynaldo Opitz, LCSW [facility]  Clinical Social Worker cell #: [phone]

## 2017-08-12 DIAGNOSIS — I5030 Unspecified diastolic (congestive) heart failure: Secondary | ICD-10-CM | POA: Diagnosis not present

## 2017-08-12 DIAGNOSIS — I208 Other forms of angina pectoris: Secondary | ICD-10-CM | POA: Diagnosis not present

## 2017-08-12 DIAGNOSIS — Z6834 Body mass index (BMI) 34.0-34.9, adult: Secondary | ICD-10-CM | POA: Diagnosis not present

## 2017-08-12 DIAGNOSIS — I119 Hypertensive heart disease without heart failure: Secondary | ICD-10-CM | POA: Diagnosis not present

## 2017-08-12 DIAGNOSIS — E785 Hyperlipidemia, unspecified: Secondary | ICD-10-CM | POA: Diagnosis not present

## 2017-08-12 DIAGNOSIS — I639 Cerebral infarction, unspecified: Secondary | ICD-10-CM | POA: Diagnosis not present

## 2017-08-12 DIAGNOSIS — E119 Type 2 diabetes mellitus without complications: Secondary | ICD-10-CM | POA: Diagnosis not present

## 2017-08-12 DIAGNOSIS — M109 Gout, unspecified: Secondary | ICD-10-CM | POA: Diagnosis not present

## 2017-08-12 DIAGNOSIS — K219 Gastro-esophageal reflux disease without esophagitis: Secondary | ICD-10-CM | POA: Diagnosis not present

## 2017-08-20 ENCOUNTER — Other Ambulatory Visit: Payer: Self-pay

## 2017-08-20 ENCOUNTER — Other Ambulatory Visit: Payer: Self-pay | Admitting: "Endocrinology

## 2017-08-20 ENCOUNTER — Other Ambulatory Visit: Payer: Self-pay | Admitting: Family Medicine

## 2017-08-20 ENCOUNTER — Other Ambulatory Visit: Payer: Self-pay | Admitting: *Deleted

## 2017-08-20 MED ORDER — CLOPIDOGREL BISULFATE 75 MG PO TABS: 75.0000 mg | ORAL_TABLET | Freq: Every day | ORAL | 1 refills | Status: DC

## 2017-08-20 MED ORDER — PANTOPRAZOLE SODIUM 40 MG PO TBEC: DELAYED_RELEASE_TABLET | ORAL | 0 refills | Status: DC

## 2017-08-20 MED ORDER — PANTOPRAZOLE SODIUM 40 MG PO TBEC: DELAYED_RELEASE_TABLET | ORAL | 1 refills | Status: DC

## 2017-08-20 MED ORDER — PRAVASTATIN SODIUM 40 MG PO TABS: 80.0000 mg | ORAL_TABLET | Freq: Every day | ORAL | 0 refills | Status: DC

## 2017-08-20 MED ORDER — PRAVASTATIN SODIUM 40 MG PO TABS: 80.0000 mg | ORAL_TABLET | Freq: Every day | ORAL | 1 refills | Status: DC

## 2017-08-20 MED ORDER — CLOPIDOGREL BISULFATE 75 MG PO TABS: 75.0000 mg | ORAL_TABLET | Freq: Every day | ORAL | 0 refills | Status: DC

## 2017-08-20 NOTE — Patient Outreach (Signed)
[facility]) Care Management  [place]  08/20/2017   [name] 12-29-1940 [id number]  Subjective: Telephone call to patient for case closure call.  Patient able to verify HIPAA.  Patient reports he is doing ok. Patient reports that Dr. Terrence Dupont put him on nitroglycerin as he was having trouble with his legs.  He states he has not had any other trouble with his legs since. Patient reports that his breathing is doing good.  He still wears his oxygen continuously as ordered but expressed that he likes to get out being that the weather is better but his tanks do not last but about 3 hours.  He mentioned a smaller machine that is able to be plugged in the car.  Advised patient that he could have advanced home care bring more tanks to assist with his desire to go out more or inquire about a different machine that could allow him more independence.  He verbalized understanding. Discussed case closure and patient in agreement.  No concerns voiced.    Objective:   Encounter Medications:  Outpatient Encounter Medications as of 08/20/2017  Medication Sig Note  . acetaminophen (TYLENOL) 500 MG tablet Take 1,000 mg by mouth every 6 (six) hours as needed for moderate pain (left thigh).    Marland Kitchen albuterol (PROVENTIL HFA;VENTOLIN HFA) 108 (90 Base) MCG/ACT inhaler Inhale 2 puffs into the lungs every 4 (four) hours as needed for wheezing or shortness of breath.   . allopurinol (ZYLOPRIM) 300 MG tablet TAKE 1 TABLET ONE TIME DAILY (Patient taking differently: TAKE 300 MG TABLET ONE TIME DAILY)   . aspirin EC 81 MG tablet Take 81 mg by mouth daily.   . clopidogrel (PLAVIX) 75 MG tablet Take 1 tablet (75 mg total) by mouth daily.   . ergocalciferol (VITAMIN D2) 50000 units capsule Take 1 capsule (50,000 Units total) by mouth once a week. One capsule once weekly (Patient taking differently: Take 50,000 Units by mouth every Monday. )   . ezetimibe (ZETIA) 10 MG tablet Take 1 tablet (10 mg  total) by mouth daily.   . ferrous sulfate 325 (65 FE) MG tablet Take 1 tablet (325 mg total) by mouth 3 (three) times daily with meals.   . insulin NPH-regular Human (NOVOLIN 70/30 RELION) (70-30) 100 UNIT/ML injection INJECT20 UNITS INTO SKIN WITH BREAKFAST AND SUPPER WHEN GLUCOSE ID ABOVE 90 (Patient taking differently: Inject 20 Units into the skin 2 (two) times daily. WHEN GLUCOSE ID ABOVE 90)   . levETIRAcetam (KEPPRA) 500 MG tablet Take 500 mg by mouth daily.   Marland Kitchen levothyroxine (SYNTHROID, LEVOTHROID) 88 MCG tablet Take 1 tablet (88 mcg total) by mouth every morning.   Marland Kitchen losartan (COZAAR) 50 MG tablet Take 50 mg by mouth daily.   . meclizine (ANTIVERT) 25 MG tablet Take 1 tablet (25 mg total) by mouth 3 (three) times daily as needed for dizziness.   . metoprolol tartrate (LOPRESSOR) 50 MG tablet Take 0.5 tablets (25 mg total) by mouth 2 (two) times daily.   . nitroGLYCERIN (NITRODUR - DOSED IN MG/24 HR) 0.2 mg/hr patch Place 0.2 mg onto the skin daily. 08/20/2017: Patient reports using taking for 12 hours daily.    . nitroGLYCERIN (NITROSTAT) 0.4 MG SL tablet Place 1 tablet (0.4 mg total) under the tongue every 5 (five) minutes as needed for chest pain.   . OXYGEN Inhale 2 L into the lungs continuous.   . pantoprazole (PROTONIX) 40 MG tablet TAKE 1 TABLET EVERY DAY  BEFORE BREAKFAST (Patient taking differently: Take 40 mg by mouth daily. )   . pravastatin (PRAVACHOL) 40 MG tablet Take 2 tablets (80 mg total) by mouth daily. (Patient taking differently: Take 40 mg by mouth 2 (two) times daily. )   . senna (SENOKOT) 8.6 MG tablet Take 1-2 tablets by mouth daily as needed for constipation.    Marland Kitchen spironolactone (ALDACTONE) 25 MG tablet Take 1 tablet (25 mg total) by mouth daily.   Marland Kitchen tiotropium (SPIRIVA HANDIHALER) 18 MCG inhalation capsule Place 1 capsule (18 mcg total) into inhaler and inhale daily. (Patient taking differently: Place 18 mcg into inhaler and inhale daily as needed (for shortness of  breath or wheezing). )   . vitamin B-12 (CYANOCOBALAMIN) 1000 MCG tablet Take 1,000 mcg by mouth daily.   Marland Kitchen triamcinolone cream (KENALOG) 0.1 % Apply 1 application topically 2 (two) times daily. Apply twice daily to rash on right elbow  , under a moist wrap for 4 weeks (Patient not taking: Reported on 06/29/2017)    No facility-administered encounter medications on file as of 08/20/2017.     Functional Status:  In your present state of health, do you have any difficulty performing the following activities: 06/29/2017 06/26/2017  Hearing? [name]  Vision? Y N  Difficulty concentrating or making decisions? Y Y  Comment - -  Walking or climbing stairs? Y Y  Dressing or bathing? Y Y  Doing errands, shopping? [place] and eating ? Y N  Using the Toilet? N N  In the past six months, have you accidently leaked urine? Y N  Do you have problems with loss of bowel control? Y N  Managing your Medications? Y Y  Managing your Finances? N Y  Housekeeping or managing your Housekeeping? Y Y  Some recent data might be hidden    Fall/Depression Screening: Fall Risk  07/27/2017 06/29/2017 06/26/2017  Falls in the past year? Yes Yes Yes  Comment - - -  Number falls in past yr: 1 2 or more 1  Comment - - -  Injury with Fall? Yes Yes -  Comment - - -  Risk Factor Category  High Fall Risk High Fall Risk -  Risk for fall due to : Impaired balance/gait Impaired balance/gait;Impaired mobility;History of fall(s) -  Risk for fall due to: Comment - - -  Follow up Education provided - -   PHQ 2/9 Scores 07/27/2017 06/29/2017 06/26/2017 06/04/2017 05/26/2017 04/28/2017 03/04/2017  PHQ - 2 Score 0 0 0 0 0 0 0    Assessment: Patient has met goals of care and is ready for case closure.   Plan:  [facility] CM Care Plan Problem One     Most Recent Value  Care Plan for Problem One  Not Active    [facility] CM Care Plan Problem Two     Most Recent Value  Care Plan Problem Two  COPD, knowledge deficit  Role  Documenting the Problem Two  Care Management Telephonic Coordinator  Care Plan for Problem Two  Active  Interventions for Problem Two Long Term Goal   Patient has not had any exacerbations of COPD.  Patient aware of signs and symptoms of active COPD.    [facility] Long Term Goal  Patient will not have COPD exacerbation in the next 60 days  [facility] Long Term Goal Start Date  06/26/17  [facility] Long Term Goal Met Date  08/20/17     RN CM will sign  off case at this time.    Jone Baseman, RN, MSN [facility] Care Management Care Management Coordinator Direct Line [phone] Toll Free: [phone]  Fax: [phone]

## 2017-08-20 NOTE — Patient Outreach (Signed)
[facility]) Care Management  08/20/2017  JANSSEN ZEE 10/25/1940 [id number]   CSW made a third and final attempt to try and contact patient today to follow-up on resources and advance directives, without success. CSW had mailed an unsuccessful outreach letter to patient's home, encouraging patient to contact CSW at their earliest convenience, if patient is interested in continuing to receive social work services through [place] with Psychologist, educational. Required number of phone attempts will have been made and outreach letter mailed.   CSW will perform a case closure on patient, as unsuccessful attempts to reach patient. CSW will fax an update to patient's Primary Care Physician, Dr. Tula Nakayama to ensure that they are aware of CSW's involvement with patient's plan of care. CSW will submit a case closure request to [place] Management Assistant with [place].   Raynaldo Opitz, LCSW [facility]  Clinical Social Worker cell #: [phone]

## 2017-08-31 ENCOUNTER — Other Ambulatory Visit: Payer: Self-pay | Admitting: "Endocrinology

## 2017-08-31 DIAGNOSIS — E1165 Type 2 diabetes mellitus with hyperglycemia: Secondary | ICD-10-CM | POA: Diagnosis not present

## 2017-09-01 LAB — HEMOGLOBIN A1C
HEMOGLOBIN A1C: 7.9 %{Hb} — AB (ref <5.7)
MEAN PLASMA GLUCOSE: 180 (calc)
eAG (mmol/L): 10 (calc)

## 2017-09-01 LAB — COMPREHENSIVE METABOLIC PANEL
AG RATIO: 1.4 (calc) (ref 1.0–2.5)
ALKALINE PHOSPHATASE (APISO): 67 U/L (ref 40–115)
ALT: 9 U/L (ref 9–46)
AST: 12 U/L (ref 10–35)
Albumin: 4 g/dL (ref 3.6–5.1)
BILIRUBIN TOTAL: 0.4 mg/dL (ref 0.2–1.2)
BUN/Creatinine Ratio: 15 (calc) (ref 6–22)
BUN: 21 mg/dL (ref 7–25)
CALCIUM: 9.3 mg/dL (ref 8.6–10.3)
CO2: 29 mmol/L (ref 20–32)
Chloride: 102 mmol/L (ref 98–110)
Creat: 1.37 mg/dL — ABNORMAL HIGH (ref 0.70–1.18)
Globulin: 2.9 g/dL (calc) (ref 1.9–3.7)
Glucose, Bld: 153 mg/dL — ABNORMAL HIGH (ref 65–99)
Potassium: 4.5 mmol/L (ref 3.5–5.3)
SODIUM: 137 mmol/L (ref 135–146)
TOTAL PROTEIN: 6.9 g/dL (ref 6.1–8.1)

## 2017-09-06 DIAGNOSIS — I5030 Unspecified diastolic (congestive) heart failure: Secondary | ICD-10-CM | POA: Diagnosis not present

## 2017-09-06 DIAGNOSIS — G4733 Obstructive sleep apnea (adult) (pediatric): Secondary | ICD-10-CM | POA: Diagnosis not present

## 2017-09-06 DIAGNOSIS — Z8673 Personal history of transient ischemic attack (TIA), and cerebral infarction without residual deficits: Secondary | ICD-10-CM | POA: Diagnosis not present

## 2017-09-06 DIAGNOSIS — J441 Chronic obstructive pulmonary disease with (acute) exacerbation: Secondary | ICD-10-CM | POA: Diagnosis not present

## 2017-09-07 ENCOUNTER — Encounter: Payer: Self-pay | Admitting: "Endocrinology

## 2017-09-07 ENCOUNTER — Ambulatory Visit (INDEPENDENT_AMBULATORY_CARE_PROVIDER_SITE_OTHER): Payer: Medicare HMO | Admitting: "Endocrinology

## 2017-09-07 VITALS — BP 148/78 | HR 73 | Ht 70.0 in | Wt 253.0 lb

## 2017-09-07 DIAGNOSIS — E1165 Type 2 diabetes mellitus with hyperglycemia: Secondary | ICD-10-CM | POA: Insufficient documentation

## 2017-09-07 DIAGNOSIS — E1121 Type 2 diabetes mellitus with diabetic nephropathy: Secondary | ICD-10-CM | POA: Insufficient documentation

## 2017-09-07 DIAGNOSIS — E038 Other specified hypothyroidism: Secondary | ICD-10-CM | POA: Diagnosis not present

## 2017-09-07 DIAGNOSIS — E782 Mixed hyperlipidemia: Secondary | ICD-10-CM

## 2017-09-07 DIAGNOSIS — I10 Essential (primary) hypertension: Secondary | ICD-10-CM | POA: Diagnosis not present

## 2017-09-07 DIAGNOSIS — Z794 Long term (current) use of insulin: Secondary | ICD-10-CM | POA: Diagnosis not present

## 2017-09-07 MED ORDER — INSULIN NPH ISOPHANE & REGULAR (70-30) 100 UNIT/ML ~~LOC~~ SUSP: SUBCUTANEOUS | 0 refills | Status: DC

## 2017-09-07 NOTE — Progress Notes (Signed)
Subjective:    Patient ID: [name], male    DOB: 1940/11/12, PCP Fayrene Helper, MD   Past Medical History:  Diagnosis Date  . Abnormality of gait 05/01/2015  . Arthritis    "left leg; knees" (05/07/2017)  . Arthritis of knee   . CAD (coronary artery disease)   . Chronic bronchitis ([place])    "get it q yr"  . Chronic lower back pain    "since I was a teen" (05/07/2017)  . COPD (chronic obstructive pulmonary disease) ([place])   . CVA (cerebral vascular accident) ([place]) 05/2015   R sided weakness (05/07/2017)  . GERD (gastroesophageal reflux disease)   . Gout   . Hyperlipidemia   . Hypertension   . Myocardial infarction ([place]) 1999  . Obesity   . OSA on CPAP   . Pneumonia    "a few times; last time was ~ 06/2013" (05/07/2017)  . Psoriasis   . Tussive syncope 05/01/2015  . Type II diabetes mellitus ([place])   . Vitamin B12 deficiency 05/01/2015   Past Surgical History:  Procedure Laterality Date  . APPENDECTOMY    . CATARACT EXTRACTION W/PHACO Left 10/07/2016   Procedure: CATARACT EXTRACTION PHACO AND INTRAOCULAR LENS PLACEMENT (IOC);  Surgeon: Rutherford Guys, MD;  Location: [facility];  Service: Ophthalmology;  Laterality: Left;  CDE: 13.49  . CATARACT EXTRACTION W/PHACO Right 10/28/2016   Procedure: CATARACT EXTRACTION PHACO AND INTRAOCULAR LENS PLACEMENT (IOC);  Surgeon: Rutherford Guys, MD;  Location: [facility];  Service: Ophthalmology;  Laterality: Right;  CDE: 16.71  . COLONOSCOPY  03/17/2012   Procedure: COLONOSCOPY;  Surgeon: Rogene Houston, MD;  Location: [facility];  Service: Endoscopy;  Laterality: N/A;  830  . COLONOSCOPY N/A 06/25/2017   Procedure: COLONOSCOPY;  Surgeon: Rogene Houston, MD;  Location: [facility];  Service: Endoscopy;  Laterality: N/A;  [place]- 2009-08/30/2013   "counting today's, I have 5 stents" (08/30/2013)  . CORONARY ANGIOPLASTY WITH STENT PLACEMENT  05/07/2017  . CORONARY STENT INTERVENTION  N/A 05/07/2017   Procedure: CORONARY STENT INTERVENTION;  Surgeon: Charolette Forward, MD;  Location: [facility];  Service: Cardiovascular;  Laterality: N/A;  . CYSTOSCOPY WITH URETHRAL DILATATION N/A 11/26/2012   Procedure: CYSTOSCOPY WITH URETHRAL DILATATION;  Surgeon: Malka So, MD;  Location: [facility];  Service: Urology;  Laterality: N/A;  . LEFT HEART CATH AND CORONARY ANGIOGRAPHY N/A 05/07/2017   Procedure: LEFT HEART CATH AND CORONARY ANGIOGRAPHY;  Surgeon: Charolette Forward, MD;  Location: [facility];  Service: Cardiovascular;  Laterality: N/A;  . LEFT HEART CATHETERIZATION WITH CORONARY ANGIOGRAM N/A 08/30/2013   Procedure: LEFT HEART CATHETERIZATION WITH CORONARY ANGIOGRAM;  Surgeon: Clent Demark, MD;  Location: [facility];  Service: Cardiovascular;  Laterality: N/A;  . PERCUTANEOUS CORONARY STENT INTERVENTION (PCI-S) Right 08/30/2013   Procedure: PERCUTANEOUS CORONARY STENT INTERVENTION (PCI-S);  Surgeon: Clent Demark, MD;  Location: [facility] C[facility] LAB;  Service: Cardiovascular;  Laterality: Right;   Social History   Socioeconomic History  . Marital status: Married    Spouse name: Not on file  . Number of children: 2  . Years of education: 33  . Highest education level: Not on file  Occupational History  . Occupation: Brewing technologist   Social Needs  . Financial resource strain: Not hard at all  . Food insecurity:    Worry: Never true    Inability: Never true  .  Transportation needs:    Medical: No    Non-medical: No  Tobacco Use  . Smoking status: Former Smoker    Packs/day: 0.50    Years: 33.00    Pack years: 16.50    Types: Cigarettes    Last attempt to quit: 11/29/2002    Years since quitting: 14.7  . Smokeless tobacco: Never Used  Substance and Sexual Activity  . Alcohol use: No  . Drug use: No  . Sexual activity: Not Currently  Lifestyle  . Physical activity:    Days per week: 0 days    Minutes per session: 0 min  . Stress: Not at all   Relationships  . Social connections:    Talks on phone: Once a week    Gets together: Never    Attends religious service: Never    Active member of club or organization: No    Attends meetings of clubs or organizations: Never    Relationship status: Married  Other Topics Concern  . Not on file  Social History Narrative   Patient drinks about 3 cups of soda daily.    Patient is left handed.    Outpatient Encounter Medications as of 09/07/2017  Medication Sig  . acetaminophen (TYLENOL) 500 MG tablet Take 1,000 mg by mouth every 6 (six) hours as needed for moderate pain (left thigh).   Marland Kitchen albuterol (PROVENTIL HFA;VENTOLIN HFA) 108 (90 Base) MCG/ACT inhaler Inhale 2 puffs into the lungs every 4 (four) hours as needed for wheezing or shortness of breath.  . allopurinol (ZYLOPRIM) 300 MG tablet TAKE 1 TABLET ONE TIME DAILY (Patient taking differently: TAKE 300 MG TABLET ONE TIME DAILY)  . aspirin EC 81 MG tablet Take 81 mg by mouth daily.  . clopidogrel (PLAVIX) 75 MG tablet Take 1 tablet (75 mg total) by mouth daily.  . ergocalciferol (VITAMIN D2) 50000 units capsule Take 1 capsule (50,000 Units total) by mouth once a week. One capsule once weekly (Patient taking differently: Take 50,000 Units by mouth every Monday. )  . ezetimibe (ZETIA) 10 MG tablet Take 1 tablet (10 mg total) by mouth daily.  . ferrous sulfate 325 (65 FE) MG tablet Take 1 tablet (325 mg total) by mouth 3 (three) times daily with meals.  . insulin NPH-regular Human (NOVOLIN 70/30) (70-30) 100 UNIT/ML injection INJECT 30 UNITS SUBCUTANEOUSLY WITH BREAKFAST  AND SUPPER (WHEN GLUCOSE IS ABOVE 90)  . levothyroxine (SYNTHROID, LEVOTHROID) 88 MCG tablet Take 1 tablet (88 mcg total) by mouth every morning.  Marland Kitchen losartan (COZAAR) 50 MG tablet Take 50 mg by mouth daily.  . meclizine (ANTIVERT) 25 MG tablet Take 1 tablet (25 mg total) by mouth 3 (three) times daily as needed for dizziness.  . metoprolol tartrate (LOPRESSOR) 50 MG tablet  Take 0.5 tablets (25 mg total) by mouth 2 (two) times daily.  . nitroGLYCERIN (NITROSTAT) 0.4 MG SL tablet Place 1 tablet (0.4 mg total) under the tongue every 5 (five) minutes as needed for chest pain.  . OXYGEN Inhale 2 L into the lungs continuous.  . pantoprazole (PROTONIX) 40 MG tablet TAKE 1 TABLET EVERY DAY BEFORE BREAKFAST  . pravastatin (PRAVACHOL) 40 MG tablet Take 2 tablets (80 mg total) by mouth daily.  Marland Kitchen senna (SENOKOT) 8.6 MG tablet Take 1-2 tablets by mouth daily as needed for constipation.   Marland Kitchen spironolactone (ALDACTONE) 25 MG tablet Take 1 tablet (25 mg total) by mouth daily.  Marland Kitchen tiotropium (SPIRIVA HANDIHALER) 18 MCG inhalation capsule Place 1 capsule (18  mcg total) into inhaler and inhale daily. (Patient taking differently: Place 18 mcg into inhaler and inhale daily as needed (for shortness of breath or wheezing). )  . triamcinolone cream (KENALOG) 0.1 % Apply 1 application topically 2 (two) times daily. Apply twice daily to rash on right elbow  , under a moist wrap for 4 weeks (Patient not taking: Reported on 06/29/2017)  . vitamin B-12 (CYANOCOBALAMIN) 1000 MCG tablet Take 1,000 mcg by mouth daily.  . [DISCONTINUED] insulin NPH-regular Human (NOVOLIN 70/30) (70-30) 100 UNIT/ML injection INJECT 40 UNITS SUBCUTANEOUSLY WITH BREAKFAST  AND SUPPER (WHEN GLUCOSE IS ABOVE 90) (Patient taking differently: 20 Units 2 (two) times daily with a meal. INJECT 40 UNITS SUBCUTANEOUSLY WITH BREAKFAST  AND SUPPER (WHEN GLUCOSE IS ABOVE 90))  . [DISCONTINUED] levETIRAcetam (KEPPRA) 500 MG tablet Take 500 mg by mouth daily.  . [DISCONTINUED] nitroGLYCERIN (NITRODUR - DOSED IN MG/24 HR) 0.2 mg/hr patch Place 0.2 mg onto the skin daily.   No facility-administered encounter medications on file as of 09/07/2017.    ALLERGIES: No Known Allergies VACCINATION STATUS: Immunization History  Administered Date(s) Administered  . Influenza Whole 03/18/2005  . Influenza,inj,Quad PF,6+ Mos 03/19/2017  .  Pneumococcal Conjugate-13 12/08/2013  . Pneumococcal Polysaccharide-23 06/09/2012  . Td 12/04/2003  . Tdap 03/06/2017    Diabetes  He presents for his follow-up diabetic visit. He has type 2 diabetes mellitus. Onset time: He was diagnosed at approximate age of 49 years. His disease course has been worsening. There are no hypoglycemic associated symptoms. Pertinent negatives for hypoglycemia include no confusion, headaches, pallor or seizures. Pertinent negatives for diabetes include no chest pain, no fatigue, no polydipsia, no polyphagia, no polyuria and no weakness. Symptoms are worsening. Diabetic complications include a CVA, heart disease, nephropathy and peripheral neuropathy. Risk factors for coronary artery disease include dyslipidemia, diabetes mellitus, male sex, obesity, tobacco exposure and sedentary lifestyle. Current diabetic treatment includes insulin injections. He is compliant with treatment most of the time. His weight is increasing steadily. He is following a generally unhealthy diet. When asked about meal planning, he reported none. His breakfast blood glucose range is generally 140-180 mg/dl. His dinner blood glucose range is generally 140-180 mg/dl. His overall blood glucose range is 140-180 mg/dl. An ACE inhibitor/angiotensin II receptor blocker is being taken. Eye exam is current.  Hyperlipidemia  This is a chronic problem. The current episode started more than 1 year ago. The problem is controlled. Exacerbating diseases include diabetes, hypothyroidism and obesity. Associated symptoms include shortness of breath. Pertinent negatives include no chest pain or myalgias. Current antihyperlipidemic treatment includes statins. Risk factors for coronary artery disease include diabetes mellitus, dyslipidemia, hypertension, male sex and a sedentary lifestyle.  Hypertension  This is a chronic problem. The current episode started more than 1 year ago. The problem is controlled. Associated  symptoms include shortness of breath. Pertinent negatives include no chest pain, headaches, neck pain or palpitations. Risk factors for coronary artery disease include dyslipidemia, diabetes mellitus, sedentary lifestyle and male gender. Past treatments include ACE inhibitors. Hypertensive end-organ damage includes CAD/MI and CVA. Identifiable causes of hypertension include a thyroid problem.  Thyroid Problem  Presents for follow-up visit. Patient reports no constipation, diarrhea, fatigue or palpitations. The symptoms have been stable. Past treatments include levothyroxine. His past medical history is significant for diabetes and hyperlipidemia.     Review of Systems  Constitutional: Negative for chills, fatigue, fever and unexpected weight change.  HENT: Negative for dental problem, mouth sores and trouble swallowing.  Eyes: Negative for visual disturbance.  Respiratory: Positive for cough, shortness of breath and wheezing. Negative for choking and chest tightness.   Cardiovascular: Negative for chest pain, palpitations and leg swelling.  Gastrointestinal: Negative for abdominal distention, abdominal pain, constipation, diarrhea, nausea and vomiting.  Endocrine: Negative for polydipsia, polyphagia and polyuria.  Genitourinary: Negative for dysuria, flank pain, hematuria and urgency.  Musculoskeletal: Positive for gait problem. Negative for back pain, myalgias and neck pain.       He walks with a walker due to recent CVA.  Skin: Negative for pallor, rash and wound.  Neurological: Negative for seizures, syncope, weakness, numbness and headaches.  Psychiatric/Behavioral: Negative.  Negative for confusion and dysphoric mood.    Objective:    BP (!) 148/78   Pulse 73   Ht 5\' 10"  (1.778 m)   Wt 253 lb (114.8 kg)   BMI 36.30 kg/m   Wt Readings from Last 3 Encounters:  09/07/17 253 lb (114.8 kg)  06/29/17 250 lb (113.4 kg)  06/25/17 250 lb (113.4 kg)    Physical Exam  Constitutional:  He is oriented to person, place, and time. He appears well-developed and well-nourished. He is cooperative. No distress.  HENT:  Head: Normocephalic and atraumatic.  Eyes: EOM are normal.  Neck: Normal range of motion. Neck supple. No tracheal deviation present. No thyromegaly present.  Cardiovascular: Normal rate, S1 normal, S2 normal and normal heart sounds. Exam reveals no gallop.  No murmur heard. Pulses:      Dorsalis pedis pulses are 1+ on the right side, and 1+ on the left side.       Posterior tibial pulses are 1+ on the right side, and 1+ on the left side.  Pulmonary/Chest: Breath sounds normal. No respiratory distress. He has no wheezes.  Abdominal: Soft. Bowel sounds are normal. He exhibits no distension. There is no tenderness. There is no guarding and no CVA tenderness.  Musculoskeletal: He exhibits edema.       Right shoulder: He exhibits no swelling and no deformity.   Mild bilateral pedal and lower leg edema.  Neurological: He is alert and oriented to person, place, and time. He has normal strength and normal reflexes. No cranial nerve deficit or sensory deficit. Gait normal.  Skin: Skin is warm and dry. No rash noted. No cyanosis. Nails show no clubbing.  Psychiatric: He has a normal mood and affect. His speech is normal and behavior is normal. Judgment and thought content normal. Cognition and memory are normal.    CMP     Component Value Date/Time   NA 137 08/31/2017 1006   K 4.5 08/31/2017 1006   CL 102 08/31/2017 1006   CO2 29 08/31/2017 1006   GLUCOSE 153 (H) 08/31/2017 1006   BUN 21 08/31/2017 1006   CREATININE 1.37 (H) 08/31/2017 1006   CALCIUM 9.3 08/31/2017 1006   PROT 6.9 08/31/2017 1006   ALBUMIN 3.8 04/20/2017 1130   AST 12 08/31/2017 1006   ALT 9 08/31/2017 1006   ALKPHOS 70 04/20/2017 1130   BILITOT 0.4 08/31/2017 1006   GFRNONAA 63 06/01/2017 0949   GFRAA 73 06/01/2017 0949     Diabetic Labs (most recent): Lab Results  Component Value Date    HGBA1C 7.9 (H) 08/31/2017   HGBA1C 6.3 (H) 05/07/2017   HGBA1C 6.3 (H) 04/21/2017     Lipid Panel ( most recent) Lipid Panel     Component Value Date/Time   CHOL 101 06/01/2017 0949   TRIG 162 (H)  06/01/2017 0949   HDL 22 (L) 06/01/2017 0949   CHOLHDL 4.6 06/01/2017 0949   VLDL 35 04/20/2017 1130   LDLCALC 55 06/01/2017 0949   LDLDIRECT 50 08/18/2014 0737      Assessment & Plan:   1. Type 2 diabetes mellitus with multiple competitions including diabetic nephropathy -His diabetes is  complicated by coronary artery disease, CVA, CKD and patient remains at a high risk for more acute and chronic complications of diabetes which include CAD, CVA, CKD, retinopathy, and neuropathy. These are all discussed in detail with the patient.  Patient came with above target glycemic profile and A1c increasing to 7.9% from 6.3%.     -  Recent labs reviewed.  - I have re-counseled the patient on diet management and weight loss  by adopting a carbohydrate restricted / protein rich  Diet.  -  Suggestion is made for him to avoid simple carbohydrates  from his diet including Cakes, Sweet Desserts / Pastries, Ice Cream, Soda (diet and regular), Sweet Tea, Candies, Chips, Cookies, Store Bought Juices, Alcohol in Excess of  1-2 drinks a day, Artificial Sweeteners, and "Sugar-free" Products. This will help patient to have stable blood glucose profile and potentially avoid unintended weight gain.   - Patient is advised to stick to a routine mealtimes to eat 3 meals  a day and avoid unnecessary snacks ( to snack only to correct hypoglycemia).  - I have approached patient with the following individualized plan to manage diabetes and patient agrees.  - He has done reasonably very well on premixed Novolin 70/30. He wishes to stay on the same regimen for cost reasons. I advised him to increase Novolin 70/30 to  30 units withakfast and 30 units with supper for pre-meal glucose of greater than 90 mg/dL. He is  advised to continue strict  monitoring of glucose  4 times a day-before meals and at bedtime.   - Patient is warned not to take insulin without proper monitoring per orders.  -Patient is encouraged to call clinic for blood glucose levels less than 70 or above 300 mg /dl. -His metformin was discontinued by his cardiologist, reason unclear for now.  His GFR at 73 with creatinine slightly elevated at 1.3, he may still benefit from low-dose metformin.  He will be considered for reinitiation by next visit.  - Patient specific target  for A1c; LDL, HDL, Triglycerides, and  Waist Circumference were discussed in detail.  2) BP/HTN: His blood pressure is above target at 148/78.  He is advised to continue his current blood pressure medications including losartan 50 mg p.o. daily.    3) Lipids/HPL: Controlled lipid panel with LDL of 55.  He is advised to continue statins. 4)  Weight/Diet: CDE consult in progress, exercise, and carbohydrates information provided.  5) hypothyroidism:  - Recent thyroid function tests are consistent with appropriate replacement. He will be continued on the same dose of levothyroxine 88 g by mouth every morning.    - We discussed about correct intake of levothyroxine, at fasting, with water, separated by at least 30 minutes from breakfast, and separated by more than 4 hours from calcium, iron, multivitamins, acid reflux medications (PPIs). -Patient is made aware of the fact that thyroid hormone replacement is needed for life, dose to be adjusted by periodic monitoring of thyroid function tests.  6) Chronic Care/Health Maintenance:  -Patient is on ACEI/ARB and Statin medications and encouraged to continue to follow up with Ophthalmology, nephrology,  Podiatrist at least yearly or  according to recommendations, and advised to  stay away from smoking. I have recommended yearly flu vaccine and pneumonia vaccination at least every 5 years;  and  sleep for at least 7 hours a  day.  - I advised him to pick up compression stockings to wear on bilateral lower extremities during the times, to help with dependent edema on bilateral lower extremities.   - I advised patient to maintain close follow up with Fayrene Helper, MD for primary care needs.  - Time spent with the patient: 25 min, of which >50% was spent in reviewing his blood glucose logs , discussing his hypo- and hyper-glycemic episodes, reviewing his current and  previous labs and insulin doses and developing a plan to avoid hypo- and hyper-glycemia. Please refer to Patient Instructions for Blood Glucose Monitoring and Insulin/Medications Dosing Guide"  in media tab for additional information. [name] participated in the discussions, expressed understanding, and voiced agreement with the above plans.  All questions were answered to his satisfaction. he is encouraged to contact clinic should he have any questions or concerns prior to his return visit.  Follow up plan: -Return in about 4 months (around 01/07/2018) for meter, and logs.  Glade Lloyd, MD Phone: [phone]  Fax: [phone]   -  This note was partially dictated with voice recognition software. Similar sounding words can be transcribed inadequately or may not  be corrected upon review.  09/07/2017, 1:17 PM

## 2017-09-07 NOTE — Patient Instructions (Signed)

## 2017-09-21 ENCOUNTER — Other Ambulatory Visit: Payer: Self-pay | Admitting: Family Medicine

## 2017-10-06 ENCOUNTER — Ambulatory Visit (INDEPENDENT_AMBULATORY_CARE_PROVIDER_SITE_OTHER): Payer: Medicare HMO | Admitting: Family Medicine

## 2017-10-06 ENCOUNTER — Encounter: Payer: Self-pay | Admitting: Family Medicine

## 2017-10-06 VITALS — BP 136/72 | HR 77 | Resp 18 | Ht 70.0 in | Wt 252.0 lb

## 2017-10-06 DIAGNOSIS — E785 Hyperlipidemia, unspecified: Secondary | ICD-10-CM | POA: Diagnosis not present

## 2017-10-06 DIAGNOSIS — E559 Vitamin D deficiency, unspecified: Secondary | ICD-10-CM

## 2017-10-06 DIAGNOSIS — E119 Type 2 diabetes mellitus without complications: Secondary | ICD-10-CM

## 2017-10-06 DIAGNOSIS — E1121 Type 2 diabetes mellitus with diabetic nephropathy: Secondary | ICD-10-CM

## 2017-10-06 DIAGNOSIS — J441 Chronic obstructive pulmonary disease with (acute) exacerbation: Secondary | ICD-10-CM | POA: Diagnosis not present

## 2017-10-06 DIAGNOSIS — I10 Essential (primary) hypertension: Secondary | ICD-10-CM

## 2017-10-06 DIAGNOSIS — Z8673 Personal history of transient ischemic attack (TIA), and cerebral infarction without residual deficits: Secondary | ICD-10-CM | POA: Diagnosis not present

## 2017-10-06 DIAGNOSIS — Z794 Long term (current) use of insulin: Secondary | ICD-10-CM

## 2017-10-06 DIAGNOSIS — E782 Mixed hyperlipidemia: Secondary | ICD-10-CM

## 2017-10-06 DIAGNOSIS — I5030 Unspecified diastolic (congestive) heart failure: Secondary | ICD-10-CM | POA: Diagnosis not present

## 2017-10-06 DIAGNOSIS — G4733 Obstructive sleep apnea (adult) (pediatric): Secondary | ICD-10-CM | POA: Diagnosis not present

## 2017-10-06 DIAGNOSIS — IMO0001 Reserved for inherently not codable concepts without codable children: Secondary | ICD-10-CM

## 2017-10-06 MED ORDER — LEVETIRACETAM 500 MG PO TABS: 500.0000 mg | ORAL_TABLET | Freq: Two times a day (BID) | ORAL | 1 refills | Status: DC

## 2017-10-06 MED ORDER — ERGOCALCIFEROL 1.25 MG (50000 UT) PO CAPS
50000.0000 [IU] | ORAL_CAPSULE | ORAL | 1 refills | Status: DC
Start: 2017-10-06 — End: 2018-06-17

## 2017-10-06 MED ORDER — EZETIMIBE 10 MG PO TABS: 10.0000 mg | ORAL_TABLET | Freq: Every day | ORAL | 1 refills | Status: DC

## 2017-10-06 MED ORDER — SPIRONOLACTONE 25 MG PO TABS: 25.0000 mg | ORAL_TABLET | Freq: Every day | ORAL | 1 refills | Status: DC

## 2017-10-06 NOTE — Patient Instructions (Addendum)
F/u in 4.5 months, call if you need me sooner  You may get diabetic shoes, today   You are referred for eye exam in [place].  You will be referred to podiatry   Careful not to fall! Please work on good  health habits so that your health will improve. 1. Commitment to daily physical activity for 30 to 60  minutes, if you are able to do this.  2. Commitment to wise food choices. Aim for half of your  food intake to be vegetable and fruit, one quarter starchy foods, and one quarter protein. Try to eat on a regular schedule  3 meals per day, snacking between meals should be limited to vegetables or fruits or small portions of nuts. 64 ounces of water per day is generally recommended, unless you have specific health conditions, like heart failure or kidney failure where you will need to limit fluid intake.  3. Commitment to sufficient and a  good quality of physical and mental rest daily, generally between 6 to 8 hours per day.  WITH PERSISTANCE AND PERSEVERANCE, THE IMPOSSIBLE , BECOMES THE NORM!

## 2017-10-12 NOTE — Progress Notes (Signed)
COWEN PESQUEIRA     MRN: [id number]      DOB: 1941/03/28   HPI [name] is here for follow up and re-evaluation of chronic medical conditions, medication management and review of any available recent lab and radiology data.  Preventive health is updated, specifically  Cancer screening and Immunization.   . The PT denies any adverse reactions to current medications since the last visit.  There are no new concerns.  There are no specific complaints  Denies polyuria, polydipsia, blurred vision , or hypoglycemic episodes. Fewer syncopal and cough episodes reported ROS Denies recent fever or chills. Denies sinus pressure, nasal congestion, ear pain or sore throat. Denies chest congestion, productive cough or wheezing. Denies chest pains, palpitations and leg swelling Denies abdominal pain, nausea, vomiting,diarrhea or constipation.   Denies dysuria, frequency, hesitancy or incontinence. Denies joint pain, swelling and limitation in mobility. Denies headaches, c/o num,bness in feet and tingling of legs C/o thick toenails and scaling rash of feet  PE  BP 136/72   Pulse 77   Resp 18   Ht 5\' 10"  (1.778 m)   Wt 252 lb (114.3 kg)   SpO2 92% Comment: 3 liters  BMI 36.16 kg/m   Patient alert and oriented and in no cardiopulmonary distress.  HEENT: No facial asymmetry, EOMI,   oropharynx pink and moist.  Neck supple no JVD, no mass.  Chest: Clear to auscultation bilaterally.Decreased air entry   CVS: S1, S2 systolic murmur, no S3 , no edema  ABD: Soft non tender.   Ext: No edema  MS: decreased  ROM spine, shoulders, hips and knees.  Skin: Intact, no ulcerations , bilateral onychomycosis  Psych: Good eye contact, normal affect. Memory intact not anxious or depressed appearing.  CNS: CN 2-12 intact,  Grade 4 power in RUE and RLE, grade 5 power in LLE and LUE,  Which is unchanged. Decrease tone in right upper and lower extremities, sensation decreased in right  foot  Assessment & Plan  Essential hypertension Controlled, no change in medication DASH diet and commitment to daily physical activity for a minimum of 30 minutes discussed and encouraged, as a part of hypertension management. The importance of attaining a healthy weight is also discussed.  BP/Weight 10/06/2017 09/07/2017 06/29/2017 06/25/2017 06/09/2017 05/08/2017 82/42/3536  Systolic BP 144 315 400 867 619 509 -  Diastolic BP 72 78 70 94 74 69 -  Wt. (Lbs) 252 253 250 250 250 250.88 -  BMI 36.16 36.3 35.87 35.87 35.87 - 36       Type 2 diabetes mellitus with diabetic nephropathy, with long-term current use of insulin (HCC) Not as well Controlled, no change in medication, managed by endo [name] is reminded of the importance of commitment to daily physical activity for 30 minutes or more, as able and the need to limit carbohydrate intake to 30 to 60 grams per meal to help with blood sugar control.   The need to take medication as prescribed, test blood sugar as directed, and to call between visits if there is a concern that blood sugar is uncontrolled is also discussed.   [name] is reminded of the importance of daily foot exam, annual eye examination, and good blood sugar, blood pressure and cholesterol control.  Diabetic Labs Latest Ref Rng & Units 08/31/2017 06/01/2017 05/08/2017 05/07/2017 04/21/2017  HbA1c <5.7 % of total Hgb 7.9(H) - - 6.3(H) 6.3(H)  Microalbumin <2.0 mg/dL - - - - -  Micro/Creat Ratio 0.0 - 30.0  mg/g - - - - -  Chol <200 mg/dL - 101 - - -  HDL >40 mg/dL - 22(L) - - -  Calc LDL mg/dL (calc) - 55 - - -  Triglycerides <150 mg/dL - 162(H) - - -  Creatinine 0.70 - 1.18 mg/dL 1.37(H) 1.14 1.18 1.25(H) 1.20(H)   BP/Weight 10/06/2017 09/07/2017 06/29/2017 06/25/2017 06/09/2017 05/08/2017 77/82/4235  Systolic BP 361 443 154 008 676 195 -  Diastolic BP 72 78 70 94 74 69 -  Wt. (Lbs) 252 253 250 250 250 250.88 -  BMI 36.16 36.3 35.87 35.87 35.87 - 36   Foot/eye  exam completion dates Latest Ref Rng & Units 10/06/2017 12/01/2016  Eye Exam No Retinopathy - -  Foot exam Order - - -  Foot Form Completion - Done Done        Morbid obesity ([place]) Deteriorated. Patient re-educated about  the importance of commitment to a  minimum of 150 minutes of exercise per week.  The importance of healthy food choices with portion control discussed. Encouraged to start a food diary, count calories and to consider  joining a support group. Sample diet sheets offered. Goals set by the patient for the next several months.   Weight /BMI 10/06/2017 09/07/2017 06/29/2017  WEIGHT 252 lb 253 lb 250 lb  HEIGHT 5\' 10"  5\' 10"  5\' 10"   BMI 36.16 kg/m2 36.3 kg/m2 35.87 kg/m2      Mixed hyperlipidemia Hyperlipidemia:Low fat diet discussed and encouraged.   Lipid Panel  Lab Results  Component Value Date   CHOL 101 06/01/2017   HDL 22 (L) 06/01/2017   LDLCALC 55 06/01/2017   LDLDIRECT 50 08/18/2014   TRIG 162 (H) 06/01/2017   CHOLHDL 4.6 06/01/2017   Updated lab needed at/ before next visit. Needs to reduce fried and fatty foods and increase exercise

## 2017-10-13 ENCOUNTER — Encounter: Payer: Self-pay | Admitting: Family Medicine

## 2017-10-13 NOTE — Assessment & Plan Note (Addendum)
Not as well Controlled, no change in medication, managed by [name] is reminded of the importance of commitment to daily physical activity for 30 minutes or more, as able and the need to limit carbohydrate intake to 30 to 60 grams per meal to help with blood sugar control.   The need to take medication as prescribed, test blood sugar as directed, and to call between visits if there is a concern that blood sugar is uncontrolled is also discussed.   [name] is reminded of the importance of daily foot exam, annual eye examination, and good blood sugar, blood pressure and cholesterol control.  Diabetic Labs Latest Ref Rng & Units 08/31/2017 06/01/2017 05/08/2017 05/07/2017 04/21/2017  HbA1c <5.7 % of total Hgb 7.9(H) - - 6.3(H) 6.3(H)  Microalbumin <2.0 mg/dL - - - - -  Micro/Creat Ratio 0.0 - 30.0 mg/g - - - - -  Chol <200 mg/dL - 101 - - -  HDL >40 mg/dL - 22(L) - - -  Calc LDL mg/dL (calc) - 55 - - -  Triglycerides <150 mg/dL - 162(H) - - -  Creatinine 0.70 - 1.18 mg/dL 1.37(H) 1.14 1.18 1.25(H) 1.20(H)   BP/Weight 10/06/2017 09/07/2017 06/29/2017 06/25/2017 06/09/2017 05/08/2017 50/35/4656  Systolic BP 812 751 700 174 944 967 -  Diastolic BP 72 78 70 94 74 69 -  Wt. (Lbs) 252 253 250 250 250 250.88 -  BMI 36.16 36.3 35.87 35.87 35.87 - 36   Foot/eye exam completion dates Latest Ref Rng & Units 10/06/2017 12/01/2016  Eye Exam No Retinopathy - -  Foot exam Order - - -  Foot Form Completion - Done Done

## 2017-10-13 NOTE — Assessment & Plan Note (Signed)
Controlled, no change in medication DASH diet and commitment to daily physical activity for a minimum of 30 minutes discussed and encouraged, as a part of hypertension management. The importance of attaining a healthy weight is also discussed.  BP/Weight 10/06/2017 09/07/2017 06/29/2017 06/25/2017 06/09/2017 05/08/2017 13/24/4010  Systolic BP 272 536 644 034 742 595 -  Diastolic BP 72 78 70 94 74 69 -  Wt. (Lbs) 252 253 250 250 250 250.88 -  BMI 36.16 36.3 35.87 35.87 35.87 - 36

## 2017-10-13 NOTE — Assessment & Plan Note (Signed)
Deteriorated. Patient re-educated about  the importance of commitment to a  minimum of 150 minutes of exercise per week.  The importance of healthy food choices with portion control discussed. Encouraged to start a food diary, count calories and to consider  joining a support group. Sample diet sheets offered. Goals set by the patient for the next several months.   Weight /BMI 10/06/2017 09/07/2017 06/29/2017  WEIGHT 252 lb 253 lb 250 lb  HEIGHT 5\' 10"  5\' 10"  5\' 10"   BMI 36.16 kg/m2 36.3 kg/m2 35.87 kg/m2

## 2017-10-13 NOTE — Assessment & Plan Note (Signed)
Hyperlipidemia:Low fat diet discussed and encouraged.   Lipid Panel  Lab Results  Component Value Date   CHOL 101 06/01/2017   HDL 22 (L) 06/01/2017   LDLCALC 55 06/01/2017   LDLDIRECT 50 08/18/2014   TRIG 162 (H) 06/01/2017   CHOLHDL 4.6 06/01/2017   Updated lab needed at/ before next visit. Needs to reduce fried and fatty foods and increase exercise

## 2017-11-06 DIAGNOSIS — J441 Chronic obstructive pulmonary disease with (acute) exacerbation: Secondary | ICD-10-CM | POA: Diagnosis not present

## 2017-11-06 DIAGNOSIS — G4733 Obstructive sleep apnea (adult) (pediatric): Secondary | ICD-10-CM | POA: Diagnosis not present

## 2017-11-06 DIAGNOSIS — I5030 Unspecified diastolic (congestive) heart failure: Secondary | ICD-10-CM | POA: Diagnosis not present

## 2017-11-06 DIAGNOSIS — Z8673 Personal history of transient ischemic attack (TIA), and cerebral infarction without residual deficits: Secondary | ICD-10-CM | POA: Diagnosis not present

## 2017-11-16 IMAGING — US US CAROTID DUPLEX BILAT
1 series · 13 of 24 positions shown · non-contrast
Comparison: 05/09/2001

CLINICAL DATA: Acute left pontine infarct.

EXAM:
BILATERAL CAROTID DUPLEX ULTRASOUND
TECHNIQUE: Gray scale imaging, color Doppler and duplex ultrasound were
performed of bilateral carotid and vertebral arteries in the neck.

[Series 1: us carotid duplex bilat · 0.07mm/px · 13 of 88 slices shown]
[im 1/88]
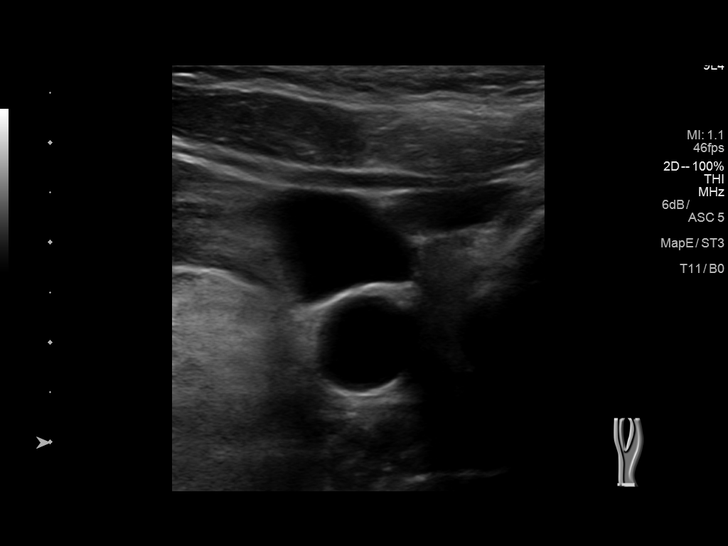
[im 8/88]
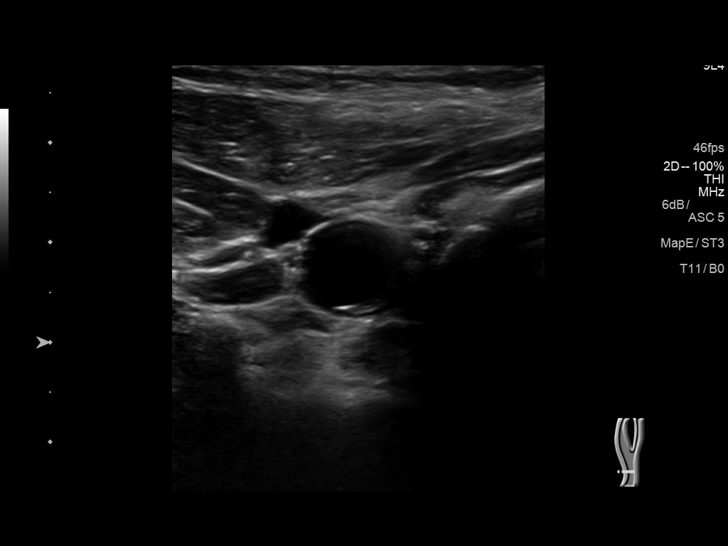
[im 16/88]
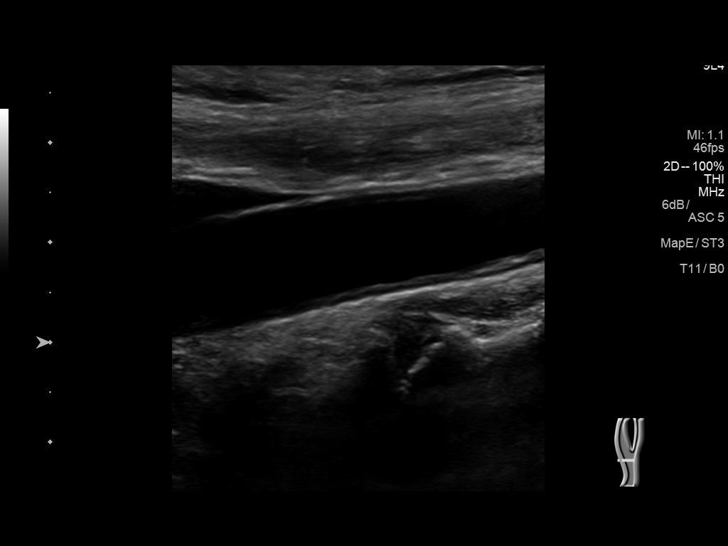
[im 23/88]
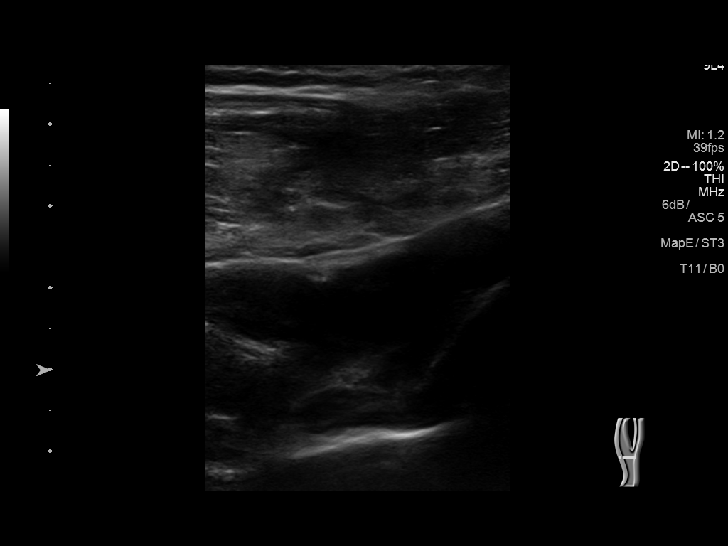
[im 31/88]
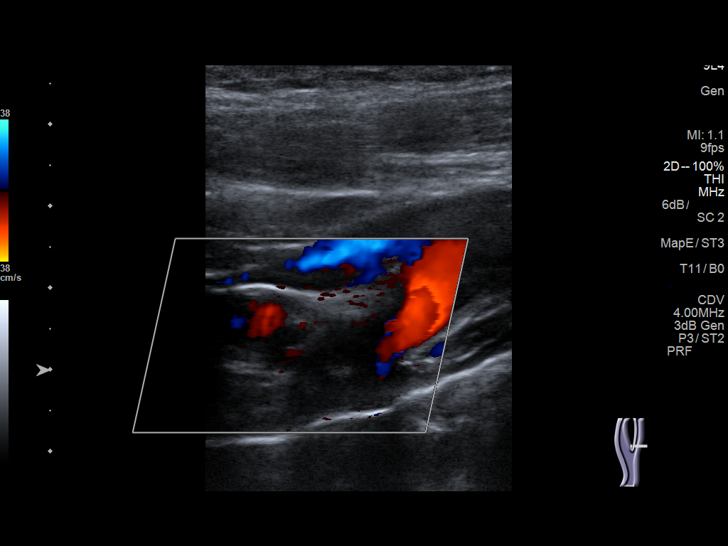
[im 38/88]
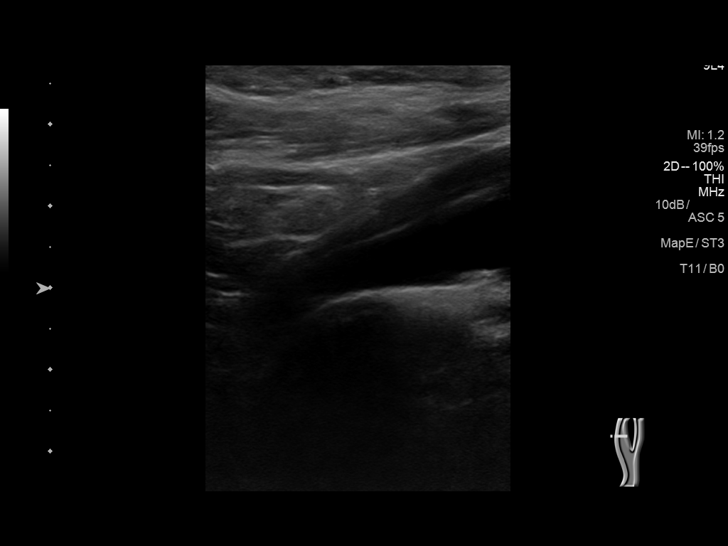
[im 46/88]
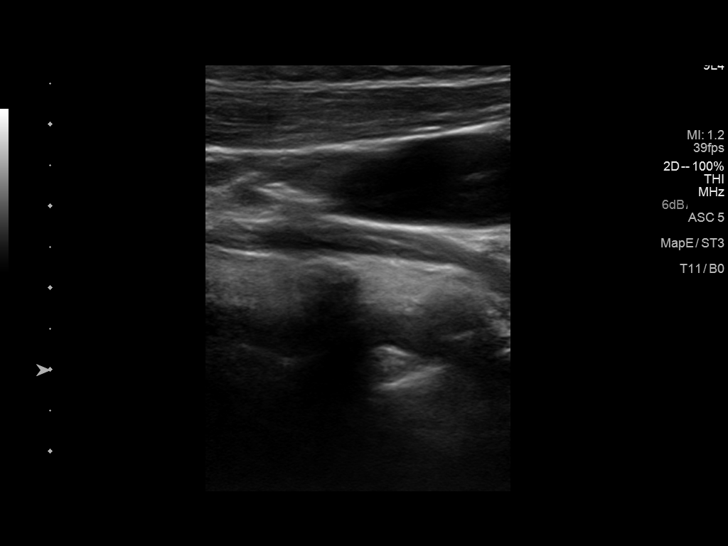
[im 50/88]
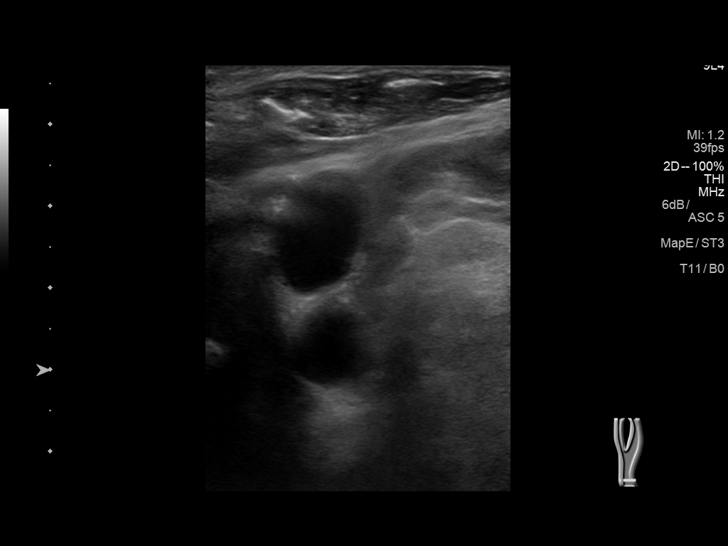
[im 57/88]
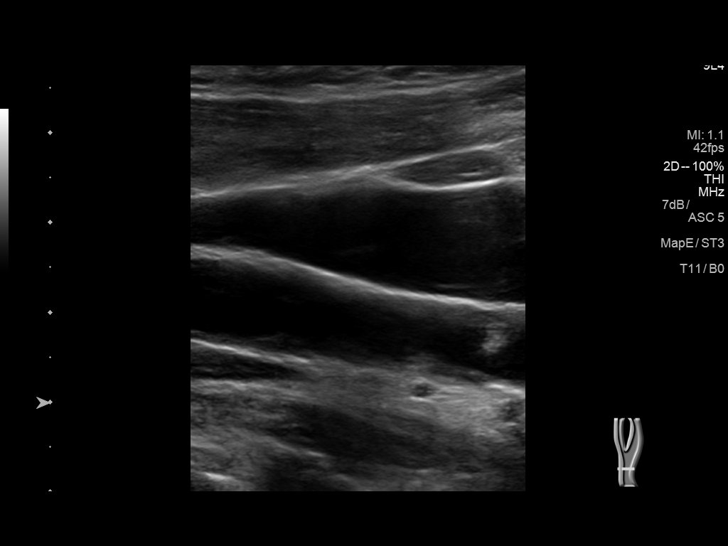
[im 65/88]
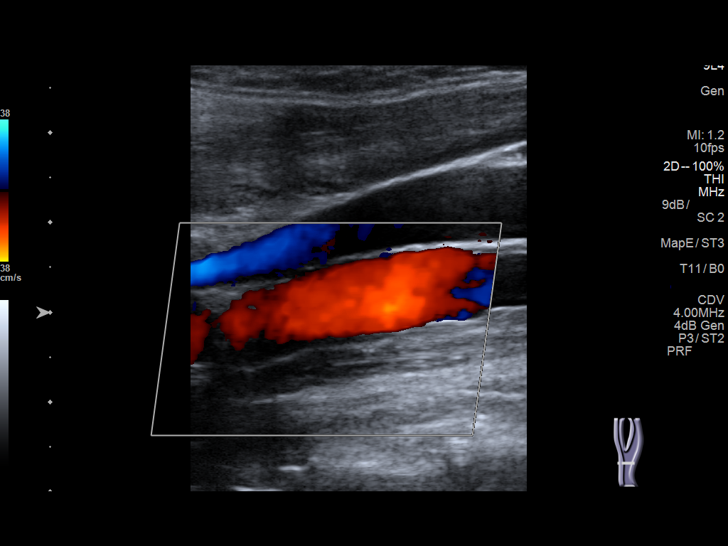
[im 72/88]
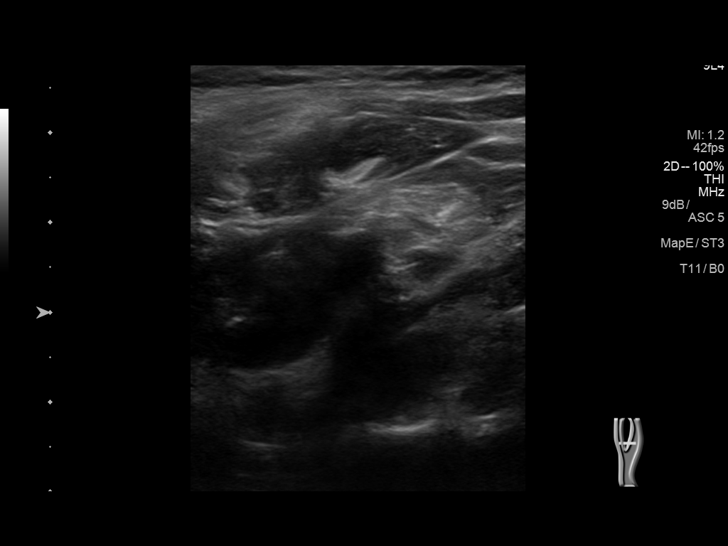
[im 80/88]
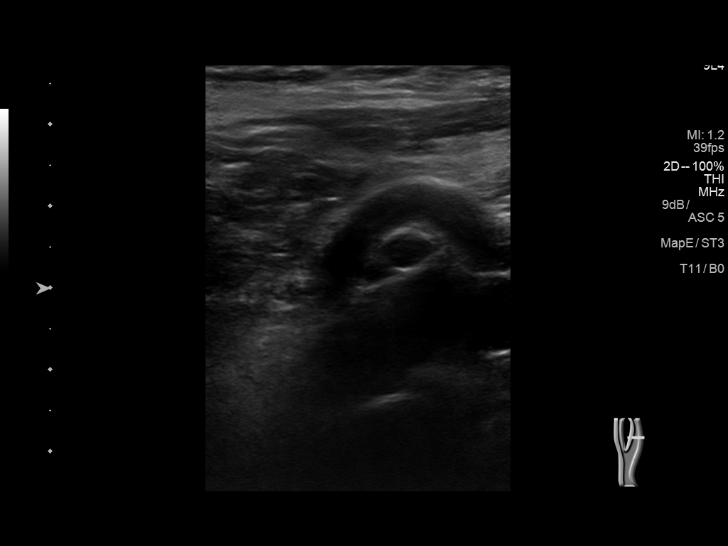
[im 88/88]
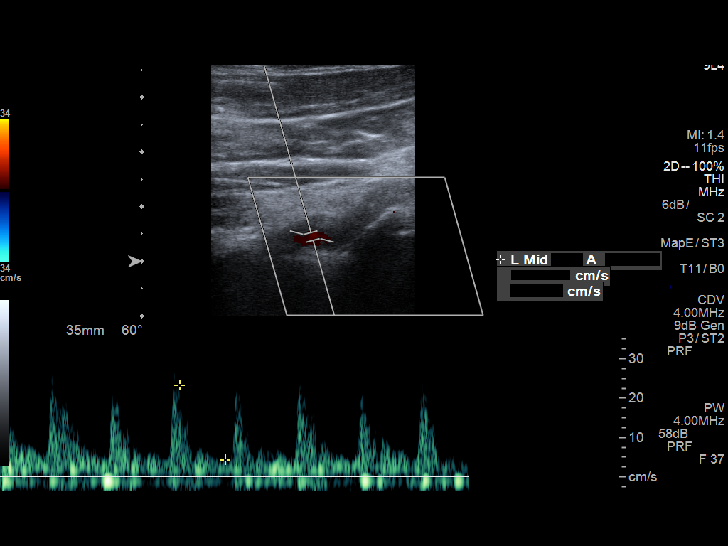

[13 of 24 positions shown; findings below may reference images not displayed]

FINDINGS: Criteria: Quantification of carotid stenosis is based on velocity
parameters that correlate the residual internal carotid diameter
with NASCET-based stenosis levels, using the diameter of the distal
internal carotid lumen as the denominator for stenosis measurement.

The following velocity measurements were obtained:

RIGHT

ICA:  103/27 cm/sec

CCA:  103/8 cm/sec

SYSTOLIC ICA/CCA RATIO:

DIASTOLIC ICA/CCA RATIO:

ECA:  204 cm/sec

LEFT

ICA:  98/25 cm/sec

CCA:  107/11 cm/sec

SYSTOLIC ICA/CCA RATIO:

DIASTOLIC ICA/CCA RATIO:

ECA:  107 cm/sec

RIGHT CAROTID ARTERY: Minor echogenic shadowing plaque formation. No
hemodynamically significant right ICA stenosis, velocity elevation,
or turbulent flow. Degree of narrowing less than 50%.

RIGHT VERTEBRAL ARTERY:  Antegrade

LEFT CAROTID ARTERY: Similar scattered minor echogenic plaque
formation. No hemodynamically significant left ICA stenosis,
velocity elevation, or turbulent flow.

LEFT VERTEBRAL ARTERY:  Antegrade
IMPRESSION: Minor carotid atherosclerosis. No hemodynamically significant ICA
stenosis. Degree of narrowing less than 50% bilaterally.

## 2017-11-26 ENCOUNTER — Other Ambulatory Visit: Payer: Self-pay | Admitting: "Endocrinology

## 2017-12-06 DIAGNOSIS — J441 Chronic obstructive pulmonary disease with (acute) exacerbation: Secondary | ICD-10-CM | POA: Diagnosis not present

## 2017-12-06 DIAGNOSIS — Z8673 Personal history of transient ischemic attack (TIA), and cerebral infarction without residual deficits: Secondary | ICD-10-CM | POA: Diagnosis not present

## 2017-12-06 DIAGNOSIS — G4733 Obstructive sleep apnea (adult) (pediatric): Secondary | ICD-10-CM | POA: Diagnosis not present

## 2017-12-06 DIAGNOSIS — I5030 Unspecified diastolic (congestive) heart failure: Secondary | ICD-10-CM | POA: Diagnosis not present

## 2018-01-06 DIAGNOSIS — G4733 Obstructive sleep apnea (adult) (pediatric): Secondary | ICD-10-CM | POA: Diagnosis not present

## 2018-01-06 DIAGNOSIS — J441 Chronic obstructive pulmonary disease with (acute) exacerbation: Secondary | ICD-10-CM | POA: Diagnosis not present

## 2018-01-06 DIAGNOSIS — Z8673 Personal history of transient ischemic attack (TIA), and cerebral infarction without residual deficits: Secondary | ICD-10-CM | POA: Diagnosis not present

## 2018-01-06 DIAGNOSIS — I5030 Unspecified diastolic (congestive) heart failure: Secondary | ICD-10-CM | POA: Diagnosis not present

## 2018-01-07 ENCOUNTER — Ambulatory Visit: Payer: Medicare HMO | Admitting: "Endocrinology

## 2018-01-07 ENCOUNTER — Encounter: Payer: Self-pay | Admitting: "Endocrinology

## 2018-02-06 DIAGNOSIS — I5030 Unspecified diastolic (congestive) heart failure: Secondary | ICD-10-CM | POA: Diagnosis not present

## 2018-02-06 DIAGNOSIS — Z8673 Personal history of transient ischemic attack (TIA), and cerebral infarction without residual deficits: Secondary | ICD-10-CM | POA: Diagnosis not present

## 2018-02-06 DIAGNOSIS — G4733 Obstructive sleep apnea (adult) (pediatric): Secondary | ICD-10-CM | POA: Diagnosis not present

## 2018-02-06 DIAGNOSIS — J441 Chronic obstructive pulmonary disease with (acute) exacerbation: Secondary | ICD-10-CM | POA: Diagnosis not present

## 2018-02-15 DIAGNOSIS — E1165 Type 2 diabetes mellitus with hyperglycemia: Secondary | ICD-10-CM | POA: Diagnosis not present

## 2018-02-16 LAB — COMPLETE METABOLIC PANEL WITH GFR
AG Ratio: 1.3 (calc) (ref 1.0–2.5)
ALBUMIN MSPROF: 3.8 g/dL (ref 3.6–5.1)
ALT: 7 U/L — ABNORMAL LOW (ref 9–46)
AST: 10 U/L (ref 10–35)
Alkaline phosphatase (APISO): 65 U/L (ref 40–115)
BILIRUBIN TOTAL: 0.4 mg/dL (ref 0.2–1.2)
BUN: 14 mg/dL (ref 7–25)
CHLORIDE: 102 mmol/L (ref 98–110)
CO2: 27 mmol/L (ref 20–32)
CREATININE: 1.11 mg/dL (ref 0.70–1.18)
Calcium: 8.9 mg/dL (ref 8.6–10.3)
GFR, EST AFRICAN AMERICAN: 75 mL/min/{1.73_m2} (ref >=60)
GFR, Est Non African American: 65 mL/min/{1.73_m2} (ref >=60)
GLOBULIN: 2.9 g/dL (ref 1.9–3.7)
Glucose, Bld: 201 mg/dL — ABNORMAL HIGH (ref 65–139)
Potassium: 4.4 mmol/L (ref 3.5–5.3)
SODIUM: 138 mmol/L (ref 135–146)
TOTAL PROTEIN: 6.7 g/dL (ref 6.1–8.1)

## 2018-02-16 LAB — HEMOGLOBIN A1C
EAG (MMOL/L): 10.6 (calc)
Hgb A1c MFr Bld: 8.3 % of total Hgb — ABNORMAL HIGH (ref <5.7)
Mean Plasma Glucose: 192 (calc)

## 2018-02-19 ENCOUNTER — Other Ambulatory Visit: Payer: Self-pay | Admitting: Family Medicine

## 2018-02-22 ENCOUNTER — Ambulatory Visit (INDEPENDENT_AMBULATORY_CARE_PROVIDER_SITE_OTHER): Payer: Medicare HMO | Admitting: Family Medicine

## 2018-02-22 ENCOUNTER — Other Ambulatory Visit: Payer: Self-pay

## 2018-02-22 ENCOUNTER — Encounter: Payer: Self-pay | Admitting: Family Medicine

## 2018-02-22 VITALS — BP 134/72 | HR 80 | Resp 12 | Ht 70.0 in | Wt 256.1 lb

## 2018-02-22 DIAGNOSIS — Z23 Encounter for immunization: Secondary | ICD-10-CM

## 2018-02-22 DIAGNOSIS — E559 Vitamin D deficiency, unspecified: Secondary | ICD-10-CM

## 2018-02-22 DIAGNOSIS — Z0000 Encounter for general adult medical examination without abnormal findings: Secondary | ICD-10-CM | POA: Diagnosis not present

## 2018-02-22 DIAGNOSIS — E785 Hyperlipidemia, unspecified: Secondary | ICD-10-CM

## 2018-02-22 DIAGNOSIS — I10 Essential (primary) hypertension: Secondary | ICD-10-CM

## 2018-02-22 NOTE — Assessment & Plan Note (Signed)
Annual exam as documented. . Immunization and cancer screening needs are specifically addressed at this visit.  

## 2018-02-22 NOTE — Assessment & Plan Note (Signed)
After obtaining informed consent, the vaccine is  administered by LPN.  

## 2018-02-22 NOTE — Progress Notes (Signed)
   [name]     MRN: [id number]      DOB: [birth year]   HPI: Patient is in for annual physical exam. No other health concerns are expressed Immunization is reviewed , and  updated     PE; BP 134/72   Pulse 80   Resp 12   Ht 5\' 10"  (1.778 m)   Wt 256 lb 1.9 oz (116.2 kg)   SpO2 92% Comment: room air  BMI 36.75 kg/m   Pleasant male, alert and oriented x 3, in no cardio-pulmonary distress. Afebrile. HEENT No facial trauma or asymetry. Sinuses non tender. EOMI, . External ears normal, tympanic membranes clear. Oropharynx moist, no exudate. Neck: decreased ROM, no adenopathy,JVD or thyromegaly.carotid  bruits.  Chest: Clear to ascultation bilaterally.No crackles or wheezes.decreased air entry Non tender to palpation    Cardiovascular system; Heart sounds normal,  S1 and  S2 ,no S3.  No  murmur, or thrill. Apical beat not displaced Peripheral pulses normal.  Abdomen: Soft, non tender, no organomegaly or masses. No bruits. Bowel sounds normal. No guarding, tenderness or rebound.     Musculoskeletal exam: Decreassed ROM of spine, hips , shoulders and knees.  deformity ,swelling or crepitus noted. Muscle wasting noted in RLE.   Neurologic: Cranial nerves 2 to 12 intact. Grade 4 power in RUE and RLE with decreased sensation in RUE and RLE  Abnormal  gait. No tremor.  Skin: Intact, no ulceration, erythema , scaling or rash noted. Pigmentation normal throughout  Psych; Normal mood and affect.    Assessment & Plan:  Annual physical exam Annual exam as documented.  Immunization and cancer screening needs are specifically addressed at this visit.   Need for immunization against influenza After obtaining informed consent, the vaccine is  administered by LPN.

## 2018-02-22 NOTE — Patient Instructions (Addendum)
F/U with MD in 4.5 months, call if you   Need me before  Flu vaccine today  Fasting lipid, PSA, cBcand vit D as soon as possible, this week please if able  Careful not to fall, and find things to do in the home  Thank you  for choosing [facility]. We consider it a privelige to serve you.  Delivering excellent health care in a caring and  compassionate way is our goal.  Partnering with you,  so that together we can achieve this goal is our strategy.

## 2018-02-23 ENCOUNTER — Encounter: Payer: Self-pay | Admitting: "Endocrinology

## 2018-02-23 ENCOUNTER — Ambulatory Visit (INDEPENDENT_AMBULATORY_CARE_PROVIDER_SITE_OTHER): Payer: Medicare HMO | Admitting: "Endocrinology

## 2018-02-23 VITALS — BP 146/76 | HR 80 | Ht 70.0 in | Wt 255.0 lb

## 2018-02-23 DIAGNOSIS — Z794 Long term (current) use of insulin: Secondary | ICD-10-CM

## 2018-02-23 DIAGNOSIS — E038 Other specified hypothyroidism: Secondary | ICD-10-CM

## 2018-02-23 DIAGNOSIS — E1121 Type 2 diabetes mellitus with diabetic nephropathy: Secondary | ICD-10-CM | POA: Diagnosis not present

## 2018-02-23 DIAGNOSIS — E1165 Type 2 diabetes mellitus with hyperglycemia: Secondary | ICD-10-CM

## 2018-02-23 DIAGNOSIS — E782 Mixed hyperlipidemia: Secondary | ICD-10-CM

## 2018-02-23 DIAGNOSIS — I10 Essential (primary) hypertension: Secondary | ICD-10-CM | POA: Diagnosis not present

## 2018-02-23 MED ORDER — INSULIN NPH ISOPHANE & REGULAR (70-30) 100 UNIT/ML ~~LOC~~ SUSP: SUBCUTANEOUS | 0 refills | Status: DC

## 2018-02-23 NOTE — Patient Instructions (Signed)

## 2018-02-23 NOTE — Progress Notes (Signed)
Endocrinology follow-up note    Subjective:    Patient ID: [name], male    DOB: 08-22-1940, PCP Fayrene Helper, MD   Past Medical History:  Diagnosis Date  . Abnormality of gait 05/01/2015  . Arthritis    "left leg; knees" (05/07/2017)  . Arthritis of knee   . CAD (coronary artery disease)   . Chronic bronchitis ([place])    "get it q yr"  . Chronic lower back pain    "since I was a teen" (05/07/2017)  . COPD (chronic obstructive pulmonary disease) ([place])   . CVA (cerebral vascular accident) ([place]) 05/2015   R sided weakness (05/07/2017)  . GERD (gastroesophageal reflux disease)   . Gout   . Hyperlipidemia   . Hypertension   . Myocardial infarction ([place]) 1999  . Obesity   . OSA on CPAP   . Pneumonia    "a few times; last time was ~ 06/2013" (05/07/2017)  . Psoriasis   . Tussive syncope 05/01/2015  . Type II diabetes mellitus ([place])   . Vitamin B12 deficiency 05/01/2015   Past Surgical History:  Procedure Laterality Date  . APPENDECTOMY    . CATARACT EXTRACTION W/PHACO Left 10/07/2016   Procedure: CATARACT EXTRACTION PHACO AND INTRAOCULAR LENS PLACEMENT (IOC);  Surgeon: Rutherford Guys, MD;  Location: [facility];  Service: Ophthalmology;  Laterality: Left;  CDE: 13.49  . CATARACT EXTRACTION W/PHACO Right 10/28/2016   Procedure: CATARACT EXTRACTION PHACO AND INTRAOCULAR LENS PLACEMENT (IOC);  Surgeon: Rutherford Guys, MD;  Location: [facility];  Service: Ophthalmology;  Laterality: Right;  CDE: 16.71  . COLONOSCOPY  03/17/2012   Procedure: COLONOSCOPY;  Surgeon: Rogene Houston, MD;  Location: [facility];  Service: Endoscopy;  Laterality: N/A;  830  . COLONOSCOPY N/A 06/25/2017   Procedure: COLONOSCOPY;  Surgeon: Rogene Houston, MD;  [facility];  Service: Endoscopy;  Laterality: N/A;  [place]- 2009-08/30/2013   "counting today's, I have 5 stents" (08/30/2013)  . CORONARY ANGIOPLASTY WITH STENT PLACEMENT  05/07/2017   . CORONARY STENT INTERVENTION N/A 05/07/2017   Procedure: CORONARY STENT INTERVENTION;  Surgeon: Charolette Forward, MD;  Location: [facility];  Service: Cardiovascular;  Laterality: N/A;  . CYSTOSCOPY WITH URETHRAL DILATATION N/A 11/26/2012   Procedure: CYSTOSCOPY WITH URETHRAL DILATATION;  Surgeon: Malka So, MD;  Location: [facility];  Service: Urology;  Laterality: N/A;  . LEFT HEART CATH AND CORONARY ANGIOGRAPHY N/A 05/07/2017   Procedure: LEFT HEART CATH AND CORONARY ANGIOGRAPHY;  Surgeon: Charolette Forward, MD;  Location: [facility];  Service: Cardiovascular;  Laterality: N/A;  . LEFT HEART CATHETERIZATION WITH CORONARY ANGIOGRAM N/A 08/30/2013   Procedure: LEFT HEART CATHETERIZATION WITH CORONARY ANGIOGRAM;  Surgeon: Clent Demark, MD;  Location: [facility];  Service: Cardiovascular;  Laterality: N/A;  . PERCUTANEOUS CORONARY STENT INTERVENTION (PCI-S) Right 08/30/2013   Procedure: PERCUTANEOUS CORONARY STENT INTERVENTION (PCI-S);  Surgeon: Clent Demark, MD;  Location: [facility] CATH LAB;  Service: Cardiovascular;  Laterality: Right;   Social History   Socioeconomic History  . Marital status: Married    Spouse name: Not on file  . Number of children: 2  . Years of education: 52  . Highest education level: Not on file  Occupational History  . Occupation: Brewing technologist   Social Needs  . Financial resource strain: Not hard at all  . Food insecurity:    Worry: Never true    Inability:  Never true  . Transportation needs:    Medical: No    Non-medical: No  Tobacco Use  . Smoking status: Former Smoker    Packs/day: 0.50    Years: 33.00    Pack years: 16.50    Types: Cigarettes    Last attempt to quit: 11/29/2002    Years since quitting: 15.2  . Smokeless tobacco: Never Used  Substance and Sexual Activity  . Alcohol use: No  . Drug use: No  . Sexual activity: Not Currently  Lifestyle  . Physical activity:    Days per week: 0 days    Minutes per session: 0 min   . Stress: Not at all  Relationships  . Social connections:    Talks on phone: Once a week    Gets together: Never    Attends religious service: Never    Active member of club or organization: No    Attends meetings of clubs or organizations: Never    Relationship status: Married  Other Topics Concern  . Not on file  Social History Narrative   Patient drinks about 3 cups of soda daily.    Patient is left handed.    Outpatient Encounter Medications as of 02/23/2018  Medication Sig  . metFORMIN (GLUCOPHAGE) 500 MG tablet Take 500 mg by mouth daily with breakfast.  . ACCU-CHEK AVIVA PLUS test strip TEST FOUR TIMES DAILY  . acetaminophen (TYLENOL) 500 MG tablet Take 1,000 mg by mouth every 6 (six) hours as needed for moderate pain (left thigh).   Marland Kitchen albuterol (PROVENTIL HFA;VENTOLIN HFA) 108 (90 Base) MCG/ACT inhaler Inhale 2 puffs into the lungs every 4 (four) hours as needed for wheezing or shortness of breath.  . allopurinol (ZYLOPRIM) 300 MG tablet TAKE 1 TABLET ONE TIME DAILY (Patient taking differently: TAKE 300 MG TABLET ONE TIME DAILY)  . aspirin EC 81 MG tablet Take 81 mg by mouth daily.  . clopidogrel (PLAVIX) 75 MG tablet Take 1 tablet (75 mg total) by mouth daily.  . ergocalciferol (VITAMIN D2) 50000 units capsule Take 1 capsule (50,000 Units total) by mouth once a week. One capsule once weekly  . ezetimibe (ZETIA) 10 MG tablet Take 1 tablet (10 mg total) by mouth daily.  . ferrous sulfate 325 (65 FE) MG tablet Take 1 tablet (325 mg total) by mouth 3 (three) times daily with meals.  . insulin NPH-regular Human (NOVOLIN 70/30) (70-30) 100 UNIT/ML injection INJECT 40 UNITS SUBCUTANEOUSLY WITH BREAKFAST  AND 30 UNITS BEFORE SUPPER (WHEN GLUCOSE IS ABOVE 90)  . levETIRAcetam (KEPPRA) 500 MG tablet Take 1 tablet (500 mg total) by mouth 2 (two) times daily.  Marland Kitchen levothyroxine (SYNTHROID, LEVOTHROID) 88 MCG tablet Take 1 tablet (88 mcg total) by mouth every morning.  Marland Kitchen losartan (COZAAR)  50 MG tablet Take 50 mg by mouth daily.  . meclizine (ANTIVERT) 25 MG tablet Take 1 tablet (25 mg total) by mouth 3 (three) times daily as needed for dizziness.  . metoprolol tartrate (LOPRESSOR) 50 MG tablet Take 0.5 tablets (25 mg total) by mouth 2 (two) times daily.  . nitroGLYCERIN (NITROSTAT) 0.4 MG SL tablet Place 1 tablet (0.4 mg total) under the tongue every 5 (five) minutes as needed for chest pain.  . OXYGEN Inhale 2 L into the lungs continuous.  . pantoprazole (PROTONIX) 40 MG tablet TAKE 1 TABLET EVERY DAY BEFORE BREAKFAST  . pravastatin (PRAVACHOL) 40 MG tablet Take 2 tablets (80 mg total) by mouth daily.  Marland Kitchen senna (SENOKOT) 8.6 MG  tablet Take 1-2 tablets by mouth daily as needed for constipation.   Marland Kitchen spironolactone (ALDACTONE) 25 MG tablet Take 1 tablet (25 mg total) by mouth daily.  Marland Kitchen tiotropium (SPIRIVA HANDIHALER) 18 MCG inhalation capsule Place 1 capsule (18 mcg total) into inhaler and inhale daily. (Patient taking differently: Place 18 mcg into inhaler and inhale daily as needed (for shortness of breath or wheezing). )  . triamcinolone cream (KENALOG) 0.1 % Apply 1 application topically 2 (two) times daily. Apply twice daily to rash on right elbow  , under a moist wrap for 4 weeks  . vitamin B-12 (CYANOCOBALAMIN) 1000 MCG tablet Take 1,000 mcg by mouth daily.  . [DISCONTINUED] insulin NPH-regular Human (NOVOLIN 70/30) (70-30) 100 UNIT/ML injection INJECT 40 UNITS SUBCUTANEOUSLY WITH BREAKFAST  AND SUPPER (WHEN GLUCOSE IS ABOVE 90) (Patient taking differently: 30 Units 2 (two) times daily with a meal. INJECT 40 UNITS SUBCUTANEOUSLY WITH BREAKFAST  AND SUPPER (WHEN GLUCOSE IS ABOVE 90))   No facility-administered encounter medications on file as of 02/23/2018.    ALLERGIES: No Known Allergies VACCINATION STATUS: Immunization History  Administered Date(s) Administered  . Influenza Whole 03/18/2005  . Influenza, High Dose Seasonal PF 02/22/2018  . Influenza,inj,Quad PF,6+ Mos  03/19/2017  . Pneumococcal Conjugate-13 12/08/2013  . Pneumococcal Polysaccharide-23 06/09/2012  . Td 12/04/2003  . Tdap 03/06/2017    Diabetes  He presents for his follow-up diabetic visit. He has type 2 diabetes mellitus. Onset time: He was diagnosed at approximate age of 85 years. His disease course has been worsening. There are no hypoglycemic associated symptoms. Pertinent negatives for hypoglycemia include no confusion, headaches, pallor or seizures. Pertinent negatives for diabetes include no chest pain, no fatigue, no polydipsia, no polyphagia, no polyuria and no weakness. Symptoms are worsening. Diabetic complications include a CVA, heart disease, nephropathy and peripheral neuropathy. Risk factors for coronary artery disease include dyslipidemia, diabetes mellitus, male sex, obesity, tobacco exposure and sedentary lifestyle. Current diabetic treatment includes insulin injections. He is compliant with treatment most of the time. His weight is stable. He is following a generally unhealthy diet. When asked about meal planning, he reported none. His home blood glucose trend is increasing steadily. His breakfast blood glucose range is generally 140-180 mg/dl. His dinner blood glucose range is generally 180-200 mg/dl. His overall blood glucose range is 180-200 mg/dl. An ACE inhibitor/angiotensin II receptor blocker is being taken. Eye exam is current.  Hyperlipidemia  This is a chronic problem. The current episode started more than 1 year ago. The problem is controlled. Exacerbating diseases include diabetes, hypothyroidism and obesity. Associated symptoms include shortness of breath. Pertinent negatives include no chest pain or myalgias. Current antihyperlipidemic treatment includes statins. Risk factors for coronary artery disease include diabetes mellitus, dyslipidemia, hypertension, male sex and a sedentary lifestyle.  Hypertension  This is a chronic problem. The current episode started more than  1 year ago. The problem is controlled. Associated symptoms include shortness of breath. Pertinent negatives include no chest pain, headaches, neck pain or palpitations. Risk factors for coronary artery disease include dyslipidemia, diabetes mellitus, sedentary lifestyle and male gender. Past treatments include ACE inhibitors. Hypertensive end-organ damage includes CAD/MI and CVA. Identifiable causes of hypertension include a thyroid problem.  Thyroid Problem  Presents for follow-up visit. Patient reports no constipation, diarrhea, fatigue or palpitations. The symptoms have been stable. Past treatments include levothyroxine. His past medical history is significant for diabetes and hyperlipidemia.     Review of Systems  Constitutional: Negative for chills, fatigue,  fever and unexpected weight change.  HENT: Negative for dental problem, mouth sores and trouble swallowing.   Eyes: Negative for visual disturbance.  Respiratory: Positive for cough, shortness of breath and wheezing. Negative for choking and chest tightness.   Cardiovascular: Negative for chest pain, palpitations and leg swelling.  Gastrointestinal: Negative for abdominal distention, abdominal pain, constipation, diarrhea, nausea and vomiting.  Endocrine: Negative for polydipsia, polyphagia and polyuria.  Genitourinary: Negative for dysuria, flank pain, hematuria and urgency.  Musculoskeletal: Positive for gait problem. Negative for back pain, myalgias and neck pain.       He walks with a walker due to recent CVA.  Skin: Negative for pallor, rash and wound.  Neurological: Negative for seizures, syncope, weakness, numbness and headaches.  Psychiatric/Behavioral: Negative.  Negative for confusion and dysphoric mood.    Objective:    BP (!) 146/76   Pulse 80   Ht 5\' 10"  (1.778 m)   Wt 255 lb (115.7 kg)   BMI 36.59 kg/m   Wt Readings from Last 3 Encounters:  02/23/18 255 lb (115.7 kg)  02/22/18 256 lb 1.9 oz (116.2 kg)  10/06/17  252 lb (114.3 kg)    Physical Exam  Constitutional: He is oriented to person, place, and time. He appears well-developed and well-nourished. He is cooperative. No distress.  HENT:  Head: Normocephalic and atraumatic.  Eyes: EOM are normal.  Neck: Normal range of motion. Neck supple. No tracheal deviation present. No thyromegaly present.  Cardiovascular: Normal rate, S1 normal, S2 normal and normal heart sounds. Exam reveals no gallop.  No murmur heard. Pulses:      Dorsalis pedis pulses are 1+ on the right side, and 1+ on the left side.       Posterior tibial pulses are 1+ on the right side, and 1+ on the left side.  Pulmonary/Chest: Breath sounds normal. No respiratory distress. He has no wheezes.  Abdominal: Soft. Bowel sounds are normal. He exhibits no distension. There is no tenderness. There is no guarding and no CVA tenderness.  Musculoskeletal: He exhibits edema.       Right shoulder: He exhibits no swelling and no deformity.   Mild bilateral pedal and lower leg edema.  Neurological: He is alert and oriented to person, place, and time. He has normal strength and normal reflexes. No cranial nerve deficit or sensory deficit. Gait normal.  Skin: Skin is warm and dry. No rash noted. No cyanosis. Nails show no clubbing.  Psychiatric: He has a normal mood and affect. His speech is normal and behavior is normal. Judgment and thought content normal. Cognition and memory are normal.    CMP     Component Value Date/Time   NA 138 02/15/2018 0856   K 4.4 02/15/2018 0856   CL 102 02/15/2018 0856   CO2 27 02/15/2018 0856   GLUCOSE 201 (H) 02/15/2018 0856   BUN 14 02/15/2018 0856   CREATININE 1.11 02/15/2018 0856   CALCIUM 8.9 02/15/2018 0856   PROT 6.7 02/15/2018 0856   ALBUMIN 3.8 04/20/2017 1130   AST 10 02/15/2018 0856   ALT 7 (L) 02/15/2018 0856   ALKPHOS 70 04/20/2017 1130   BILITOT 0.4 02/15/2018 0856   GFRNONAA 65 02/15/2018 0856   GFRAA 75 02/15/2018 0856     Diabetic  Labs (most recent): Lab Results  Component Value Date   HGBA1C 8.3 (H) 02/15/2018   HGBA1C 7.9 (H) 08/31/2017   HGBA1C 6.3 (H) 05/07/2017     Lipid Panel ( most recent)  Lipid Panel     Component Value Date/Time   CHOL 101 06/01/2017 0949   TRIG 162 (H) 06/01/2017 0949   HDL 22 (L) 06/01/2017 0949   CHOLHDL 4.6 06/01/2017 0949   VLDL 35 04/20/2017 1130   LDLCALC 55 06/01/2017 0949   LDLDIRECT 50 08/18/2014 0737      Assessment & Plan:   1. Type 2 diabetes mellitus with multiple competitions including diabetic nephropathy -His diabetes is  complicated by coronary artery disease, CVA, CKD and patient remains at a high risk for more acute and chronic complications of diabetes which include CAD, CVA, CKD, retinopathy, and neuropathy. These are all discussed in detail with the patient.  Patient came with above target glycemic profile and A1c increasing to 8.3%, progressively increasing from 6.3%.       -  Recent labs reviewed.  - I have re-counseled the patient on diet management and weight loss  by adopting a carbohydrate restricted / protein rich  Diet.  -  Suggestion is made for him to avoid simple carbohydrates  from his diet including Cakes, Sweet Desserts / Pastries, Ice Cream, Soda (diet and regular), Sweet Tea, Candies, Chips, Cookies, Store Bought Juices, Alcohol in Excess of  1-2 drinks a day, Artificial Sweeteners, and "Sugar-free" Products. This will help patient to have stable blood glucose profile and potentially avoid unintended weight gain.  - Patient is advised to stick to a routine mealtimes to eat 3 meals  a day and avoid unnecessary snacks ( to snack only to correct hypoglycemia).  - I have approached patient with the following individualized plan to manage diabetes and patient agrees.  - He has done reasonably very well on premixed Novolin 70/30. He wishes to stay on the same regimen for cost reasons.  -He is advised to increase Novolin 70/30 to  40 units  withakfast and 30 units with supper for pre-meal glucose of greater than 90 mg/dL. He is advised to continue strict  monitoring of glucose  4 times a day-before meals and at bedtime.   - Patient is warned not to take insulin without proper monitoring per orders.  -Patient is encouraged to call clinic for blood glucose levels less than 70 or above 300 mg /dl. -He will be resumed on low-dose metformin, 500 mg p.o. daily after breakfast.     - Patient specific target  for A1c; LDL, HDL, Triglycerides, and  Waist Circumference were discussed in detail.  2) BP/HTN: His blood pressure is above target .  He is advised to continue his current blood pressure medications including losartan 50 mg p.o. daily.    3) Lipids/HPL: His recent lipid panel showed controlled LDL at 55.  He is advised to continue pravastatin 40 mg p.o. nightly.  4)  Weight/Diet: CDE consult in progress, exercise, and carbohydrates information provided.  5) hypothyroidism:  - Recent thyroid function tests are consistent with appropriate replacement. He will be continued on the same dose of levothyroxine 88 g by mouth every morning.    - We discussed about correct intake of levothyroxine, at fasting, with water, separated by at least 30 minutes from breakfast, and separated by more than 4 hours from calcium, iron, multivitamins, acid reflux medications (PPIs). -Patient is made aware of the fact that thyroid hormone replacement is needed for life, dose to be adjusted by periodic monitoring of thyroid function tests.  6) Chronic Care/Health Maintenance:  -Patient is on ACEI/ARB and Statin medications and encouraged to continue to follow up with Ophthalmology,  nephrology,  Podiatrist at least yearly or according to recommendations, and advised to  stay away from smoking. I have recommended yearly flu vaccine and pneumonia vaccination at least every 5 years;  and  sleep for at least 7 hours a day.  - I advised him to pick up  compression stockings to wear on bilateral lower extremities during the times, to help with dependent edema on bilateral lower extremities.   - I advised patient to maintain close follow up with Fayrene Helper, MD for primary care needs.  - Time spent with the patient: 25 min, of which >50% was spent in reviewing his blood glucose logs , discussing his hypo- and hyper-glycemic episodes, reviewing his current and  previous labs and insulin doses and developing a plan to avoid hypo- and hyper-glycemia. Please refer to Patient Instructions for Blood Glucose Monitoring and Insulin/Medications Dosing Guide"  in media tab for additional information. Harlow Mares participated in the discussions, expressed understanding, and voiced agreement with the above plans.  All questions were answered to his satisfaction. he is encouraged to contact clinic should he have any questions or concerns prior to his return visit.   Follow up plan: -Return in about 3 months (around 05/26/2018) for Meter, and Logs.  Glade Lloyd, MD Phone: [phone]  Fax: [phone]   -  This note was partially dictated with voice recognition software. Similar sounding words can be transcribed inadequately or may not  be corrected upon review.  02/23/2018, 1:49 PM

## 2018-03-05 DIAGNOSIS — I10 Essential (primary) hypertension: Secondary | ICD-10-CM | POA: Diagnosis not present

## 2018-03-05 DIAGNOSIS — E559 Vitamin D deficiency, unspecified: Secondary | ICD-10-CM | POA: Diagnosis not present

## 2018-03-05 DIAGNOSIS — Z0000 Encounter for general adult medical examination without abnormal findings: Secondary | ICD-10-CM | POA: Diagnosis not present

## 2018-03-05 DIAGNOSIS — E785 Hyperlipidemia, unspecified: Secondary | ICD-10-CM | POA: Diagnosis not present

## 2018-03-06 ENCOUNTER — Other Ambulatory Visit: Payer: Self-pay | Admitting: Family Medicine

## 2018-03-06 MED ORDER — EZETIMIBE 10 MG PO TABS: 10.0000 mg | ORAL_TABLET | Freq: Every day | ORAL | 3 refills | Status: DC

## 2018-03-06 NOTE — Progress Notes (Signed)
zetia 

## 2018-03-08 ENCOUNTER — Other Ambulatory Visit: Payer: Self-pay | Admitting: Family Medicine

## 2018-03-08 DIAGNOSIS — G4733 Obstructive sleep apnea (adult) (pediatric): Secondary | ICD-10-CM | POA: Diagnosis not present

## 2018-03-08 DIAGNOSIS — J441 Chronic obstructive pulmonary disease with (acute) exacerbation: Secondary | ICD-10-CM | POA: Diagnosis not present

## 2018-03-08 DIAGNOSIS — Z8673 Personal history of transient ischemic attack (TIA), and cerebral infarction without residual deficits: Secondary | ICD-10-CM | POA: Diagnosis not present

## 2018-03-08 DIAGNOSIS — I5030 Unspecified diastolic (congestive) heart failure: Secondary | ICD-10-CM | POA: Diagnosis not present

## 2018-03-08 LAB — CBC
HEMATOCRIT: 36 % — AB (ref 38.5–50.0)
Hemoglobin: 11.9 g/dL — ABNORMAL LOW (ref 13.2–17.1)
MCH: 27 pg (ref 27.0–33.0)
MCHC: 33.1 g/dL (ref 32.0–36.0)
MCV: 81.6 fL (ref 80.0–100.0)
MPV: 9.8 fL (ref 7.5–12.5)
Platelets: 272 10*3/uL (ref 140–400)
RBC: 4.41 10*6/uL (ref 4.20–5.80)
RDW: 13.3 % (ref 11.0–15.0)
WBC: 11.1 10*3/uL — ABNORMAL HIGH (ref 3.8–10.8)

## 2018-03-08 LAB — VITAMIN D 25 HYDROXY (VIT D DEFICIENCY, FRACTURES): Vit D, 25-Hydroxy: 38 ng/mL (ref 30–100)

## 2018-03-08 LAB — LIPID PANEL
CHOL/HDL RATIO: 5 (calc) — AB (ref <5.0)
Cholesterol: 99 mg/dL (ref <200)
HDL: 20 mg/dL — AB (ref >40)
LDL CHOLESTEROL (CALC): 47 mg/dL
Non-HDL Cholesterol (Calc): 79 mg/dL (calc) (ref <130)
Triglycerides: 305 mg/dL — ABNORMAL HIGH (ref <150)

## 2018-03-08 LAB — URIC ACID: URIC ACID, SERUM: 9.8 mg/dL — AB (ref 4.0–8.0)

## 2018-03-08 LAB — PSA: PSA: 0.5 ng/mL (ref <=4.0)

## 2018-03-08 MED ORDER — ALLOPURINOL 300 MG PO TABS: 300.0000 mg | ORAL_TABLET | Freq: Every day | ORAL | 1 refills | Status: DC

## 2018-03-09 ENCOUNTER — Other Ambulatory Visit: Payer: Self-pay | Admitting: "Endocrinology

## 2018-03-09 ENCOUNTER — Other Ambulatory Visit: Payer: Self-pay | Admitting: Family Medicine

## 2018-03-09 DIAGNOSIS — I2510 Atherosclerotic heart disease of native coronary artery without angina pectoris: Secondary | ICD-10-CM

## 2018-04-08 DIAGNOSIS — Z8673 Personal history of transient ischemic attack (TIA), and cerebral infarction without residual deficits: Secondary | ICD-10-CM | POA: Diagnosis not present

## 2018-04-08 DIAGNOSIS — G4733 Obstructive sleep apnea (adult) (pediatric): Secondary | ICD-10-CM | POA: Diagnosis not present

## 2018-04-08 DIAGNOSIS — J441 Chronic obstructive pulmonary disease with (acute) exacerbation: Secondary | ICD-10-CM | POA: Diagnosis not present

## 2018-04-08 DIAGNOSIS — I5030 Unspecified diastolic (congestive) heart failure: Secondary | ICD-10-CM | POA: Diagnosis not present

## 2018-04-26 ENCOUNTER — Other Ambulatory Visit: Payer: Self-pay | Admitting: Family Medicine

## 2018-05-08 DIAGNOSIS — J441 Chronic obstructive pulmonary disease with (acute) exacerbation: Secondary | ICD-10-CM | POA: Diagnosis not present

## 2018-05-08 DIAGNOSIS — Z8673 Personal history of transient ischemic attack (TIA), and cerebral infarction without residual deficits: Secondary | ICD-10-CM | POA: Diagnosis not present

## 2018-05-08 DIAGNOSIS — G4733 Obstructive sleep apnea (adult) (pediatric): Secondary | ICD-10-CM | POA: Diagnosis not present

## 2018-05-08 DIAGNOSIS — I5030 Unspecified diastolic (congestive) heart failure: Secondary | ICD-10-CM | POA: Diagnosis not present

## 2018-05-10 ENCOUNTER — Other Ambulatory Visit: Payer: Self-pay | Admitting: "Endocrinology

## 2018-05-13 ENCOUNTER — Other Ambulatory Visit: Payer: Self-pay | Admitting: Family Medicine

## 2018-05-26 ENCOUNTER — Ambulatory Visit: Payer: Medicare HMO | Admitting: "Endocrinology

## 2018-06-08 DIAGNOSIS — I5030 Unspecified diastolic (congestive) heart failure: Secondary | ICD-10-CM | POA: Diagnosis not present

## 2018-06-08 DIAGNOSIS — G4733 Obstructive sleep apnea (adult) (pediatric): Secondary | ICD-10-CM | POA: Diagnosis not present

## 2018-06-08 DIAGNOSIS — J441 Chronic obstructive pulmonary disease with (acute) exacerbation: Secondary | ICD-10-CM | POA: Diagnosis not present

## 2018-06-08 DIAGNOSIS — Z8673 Personal history of transient ischemic attack (TIA), and cerebral infarction without residual deficits: Secondary | ICD-10-CM | POA: Diagnosis not present

## 2018-06-11 ENCOUNTER — Other Ambulatory Visit: Payer: Self-pay | Admitting: Family Medicine

## 2018-06-11 DIAGNOSIS — I2510 Atherosclerotic heart disease of native coronary artery without angina pectoris: Secondary | ICD-10-CM

## 2018-06-16 ENCOUNTER — Other Ambulatory Visit: Payer: Self-pay | Admitting: Family Medicine

## 2018-06-16 DIAGNOSIS — E559 Vitamin D deficiency, unspecified: Secondary | ICD-10-CM

## 2018-06-23 ENCOUNTER — Other Ambulatory Visit: Payer: Self-pay | Admitting: "Endocrinology

## 2018-06-28 DIAGNOSIS — R0609 Other forms of dyspnea: Secondary | ICD-10-CM | POA: Diagnosis not present

## 2018-06-28 DIAGNOSIS — Z8673 Personal history of transient ischemic attack (TIA), and cerebral infarction without residual deficits: Secondary | ICD-10-CM | POA: Diagnosis not present

## 2018-06-28 DIAGNOSIS — I5030 Unspecified diastolic (congestive) heart failure: Secondary | ICD-10-CM | POA: Diagnosis not present

## 2018-06-28 DIAGNOSIS — E785 Hyperlipidemia, unspecified: Secondary | ICD-10-CM | POA: Diagnosis not present

## 2018-06-28 DIAGNOSIS — I208 Other forms of angina pectoris: Secondary | ICD-10-CM | POA: Diagnosis not present

## 2018-06-28 DIAGNOSIS — I10 Essential (primary) hypertension: Secondary | ICD-10-CM | POA: Diagnosis not present

## 2018-06-28 DIAGNOSIS — M109 Gout, unspecified: Secondary | ICD-10-CM | POA: Diagnosis not present

## 2018-06-28 DIAGNOSIS — K219 Gastro-esophageal reflux disease without esophagitis: Secondary | ICD-10-CM | POA: Diagnosis not present

## 2018-06-28 DIAGNOSIS — E119 Type 2 diabetes mellitus without complications: Secondary | ICD-10-CM | POA: Diagnosis not present

## 2018-06-30 ENCOUNTER — Other Ambulatory Visit: Payer: Self-pay | Admitting: Family Medicine

## 2018-07-05 ENCOUNTER — Other Ambulatory Visit: Payer: Self-pay | Admitting: Family Medicine

## 2018-07-09 DIAGNOSIS — Z8673 Personal history of transient ischemic attack (TIA), and cerebral infarction without residual deficits: Secondary | ICD-10-CM | POA: Diagnosis not present

## 2018-07-09 DIAGNOSIS — G4733 Obstructive sleep apnea (adult) (pediatric): Secondary | ICD-10-CM | POA: Diagnosis not present

## 2018-07-09 DIAGNOSIS — I5030 Unspecified diastolic (congestive) heart failure: Secondary | ICD-10-CM | POA: Diagnosis not present

## 2018-07-09 DIAGNOSIS — J441 Chronic obstructive pulmonary disease with (acute) exacerbation: Secondary | ICD-10-CM | POA: Diagnosis not present

## 2018-07-12 ENCOUNTER — Ambulatory Visit (INDEPENDENT_AMBULATORY_CARE_PROVIDER_SITE_OTHER): Payer: Medicare HMO | Admitting: Family Medicine

## 2018-07-12 ENCOUNTER — Encounter: Payer: Self-pay | Admitting: Family Medicine

## 2018-07-12 VITALS — BP 159/80 | HR 71 | Resp 14 | Ht 70.0 in | Wt 261.0 lb

## 2018-07-12 DIAGNOSIS — I10 Essential (primary) hypertension: Secondary | ICD-10-CM | POA: Diagnosis not present

## 2018-07-12 DIAGNOSIS — E1121 Type 2 diabetes mellitus with diabetic nephropathy: Secondary | ICD-10-CM

## 2018-07-12 DIAGNOSIS — E038 Other specified hypothyroidism: Secondary | ICD-10-CM | POA: Diagnosis not present

## 2018-07-12 DIAGNOSIS — L608 Other nail disorders: Secondary | ICD-10-CM

## 2018-07-12 DIAGNOSIS — Z794 Long term (current) use of insulin: Secondary | ICD-10-CM | POA: Diagnosis not present

## 2018-07-12 DIAGNOSIS — E782 Mixed hyperlipidemia: Secondary | ICD-10-CM | POA: Diagnosis not present

## 2018-07-12 DIAGNOSIS — R269 Unspecified abnormalities of gait and mobility: Secondary | ICD-10-CM | POA: Diagnosis not present

## 2018-07-12 NOTE — Patient Instructions (Addendum)
Wellness with Nurse past duue , please schedule  bLOOD PRESSURE IS HIGH TIODAY   Pt to schedule f/u wit Dr [name] Annual physical exam wit MD October 6 8 or shortly after   Labs tomorrow, lipid, cmp and EGFr and HBA1C and TSH and microalb today  Pt to get glucerna to take AFTER his labs  You are referred to Podiatry for foot CARE, YOU NEED THIS, WE WILL CALL WITH APPT BY TOMORROW

## 2018-07-13 ENCOUNTER — Other Ambulatory Visit: Payer: Self-pay | Admitting: "Endocrinology

## 2018-07-13 DIAGNOSIS — Z794 Long term (current) use of insulin: Secondary | ICD-10-CM | POA: Diagnosis not present

## 2018-07-13 DIAGNOSIS — I10 Essential (primary) hypertension: Secondary | ICD-10-CM | POA: Diagnosis not present

## 2018-07-13 DIAGNOSIS — E1121 Type 2 diabetes mellitus with diabetic nephropathy: Secondary | ICD-10-CM | POA: Diagnosis not present

## 2018-07-13 DIAGNOSIS — E782 Mixed hyperlipidemia: Secondary | ICD-10-CM | POA: Diagnosis not present

## 2018-07-13 DIAGNOSIS — E038 Other specified hypothyroidism: Secondary | ICD-10-CM | POA: Diagnosis not present

## 2018-07-14 ENCOUNTER — Other Ambulatory Visit: Payer: Self-pay | Admitting: Family Medicine

## 2018-07-14 DIAGNOSIS — L608 Other nail disorders: Secondary | ICD-10-CM | POA: Insufficient documentation

## 2018-07-14 LAB — TSH: TSH: 7.84 mIU/L — ABNORMAL HIGH (ref 0.40–4.50)

## 2018-07-14 LAB — COMPLETE METABOLIC PANEL WITH GFR
AG Ratio: 1.2 (calc) (ref 1.0–2.5)
ALT: 6 U/L — ABNORMAL LOW (ref 9–46)
AST: 9 U/L — AB (ref 10–35)
Albumin: 3.7 g/dL (ref 3.6–5.1)
Alkaline phosphatase (APISO): 57 U/L (ref 35–144)
BUN/Creatinine Ratio: 18 (calc) (ref 6–22)
BUN: 22 mg/dL (ref 7–25)
CO2: 29 mmol/L (ref 20–32)
Calcium: 9 mg/dL (ref 8.6–10.3)
Chloride: 101 mmol/L (ref 98–110)
Creat: 1.2 mg/dL — ABNORMAL HIGH (ref 0.70–1.18)
GFR, EST NON AFRICAN AMERICAN: 58 mL/min/{1.73_m2} — AB (ref >=60)
GFR, Est African American: 68 mL/min/{1.73_m2} (ref >=60)
Globulin: 3 g/dL (calc) (ref 1.9–3.7)
Glucose, Bld: 140 mg/dL — ABNORMAL HIGH (ref 65–99)
Potassium: 4.3 mmol/L (ref 3.5–5.3)
Sodium: 139 mmol/L (ref 135–146)
Total Bilirubin: 0.3 mg/dL (ref 0.2–1.2)
Total Protein: 6.7 g/dL (ref 6.1–8.1)

## 2018-07-14 LAB — MICROALBUMIN / CREATININE URINE RATIO
Creatinine, Urine: 132 mg/dL (ref 20–320)
MICROALB/CREAT RATIO: 32 ug/mg{creat} — AB (ref <30)
Microalb, Ur: 4.2 mg/dL

## 2018-07-14 LAB — LIPID PANEL
Cholesterol: 91 mg/dL (ref <200)
HDL: 22 mg/dL — ABNORMAL LOW (ref >=40)
LDL Cholesterol (Calc): 43 mg/dL (calc)
Non-HDL Cholesterol (Calc): 69 mg/dL (calc) (ref <130)
Total CHOL/HDL Ratio: 4.1 (calc) (ref <5.0)
Triglycerides: 188 mg/dL — ABNORMAL HIGH (ref <150)

## 2018-07-14 LAB — HEMOGLOBIN A1C
Hgb A1c MFr Bld: 7.3 % of total Hgb — ABNORMAL HIGH (ref <5.7)
Mean Plasma Glucose: 163 (calc)
eAG (mmol/L): 9 (calc)

## 2018-07-14 NOTE — Assessment & Plan Note (Signed)
Elevated at visit, but has medication on day of visit Re eval in 6 to 8 weeks DASH diet and commitment to daily physical activity for a minimum of 30 minutes discussed and encouraged, as a part of hypertension management. The importance of attaining a healthy weight is also discussed.  BP/Weight 07/12/2018 02/23/2018 02/22/2018 10/06/2017 09/07/2017 12/30/4816 09/22/3147  Systolic BP 702 637 858 850 277 412 878  Diastolic BP 80 76 72 72 78 70 94  Wt. (Lbs) 261 255 256.12 252 253 250 250  BMI 37.45 36.59 36.75 36.16 36.3 35.87 35.87

## 2018-07-14 NOTE — Assessment & Plan Note (Signed)
Deteriorated.Obesity linked with hypertension and iDDM  Patient re-educated about  the importance of commitment to a  minimum of 150 minutes of exercise per week as able.  The importance of healthy food choices with portion control discussed, as well as eating regularly and within a 12 hour window most days. The need to choose "clean , green" food 50 to 75% of the time is discussed, as well as to make water the primary drink and set a goal of 64 ounces water daily.  Encouraged to start a food diary,  and to consider  joining a support group. Sample diet sheets offered. Goals set by the patient for the next several months.   Weight /BMI 07/12/2018 02/23/2018 02/22/2018  WEIGHT 261 lb 255 lb 256 lb 1.9 oz  HEIGHT 5\' 10"  5\' 10"  5\' 10"   BMI 37.45 kg/m2 36.59 kg/m2 36.75 kg/m2

## 2018-07-14 NOTE — Assessment & Plan Note (Signed)
Hyperlipidemia:Low fat diet discussed and encouraged.   Lipid Panel  Lab Results  Component Value Date   CHOL 91 07/13/2018   HDL 22 (L) 07/13/2018   LDLCALC 43 07/13/2018   LDLDIRECT 50 08/18/2014   TRIG 188 (H) 07/13/2018   CHOLHDL 4.1 07/13/2018   Needs to increase exercisr as able and lower fat, much improved

## 2018-07-14 NOTE — Assessment & Plan Note (Signed)
Increased fall risk from hemiparesis and osteoarthritis, home safety and fall risk reduction discussed

## 2018-07-14 NOTE — Progress Notes (Signed)
[name]     MRN: [id number]      DOB: 16-Sep-1940   HPI Mr. Whitmire is here for follow up and re-evaluation of chronic medical conditions, medication management and review of any available recent lab and radiology data.  Preventive health is updated, specifically  Cancer screening and Immunization.   Needs to schedule with Endo, past due The PT denies any adverse reactions to current medications since the last visit.  There are no new concerns.  Tc/o long thick toenails, and thick scaly skin of feet Denies polyuria, polydipsia, blurred vision , or hypoglycemic episodes.   ROS Denies recent fever or chills. Denies sinus pressure, nasal congestion, ear pain or sore throat. Denies chest congestion, productive cough or wheezing. Denies chest pains, palpitations and leg swelling Denies abdominal pain, nausea, vomiting,diarrhea or constipation.   Denies dysuria, frequency, hesitancy or incontinence. Denies uncontrolled  joint pain, swelling and does have  limitation in mobility. Denies headaches, seizures, numbness, or tingling. Denies depression, anxiety or insomnia.  PE  BP (!) 159/80   Pulse 71   Resp 14   Ht 5\' 10"  (1.778 m)   Wt 261 lb (118.4 kg)   SpO2 91% Comment: room air  BMI 37.45 kg/m   Patient alert and oriented and in no cardiopulmonary distress.  HEENT: No facial asymmetry, EOMI,   oropharynx pink and moist.  Neck decreased ROMno JVD, no mass.  Chest: Clear to auscultation bilaterally.  CVS: S1, S2 no murmurs, no S3.Regular rate.  ABD: Soft non tender.   Ext: No edema  MS: decreased  ROM spine, shoulders, hips and knees.  Skin: Intact, severe bilateral tinea pedis and bilateral onychomycosis  Psych: Good eye contact, normal affect. Memory intact not anxious or depressed appearing.  CNS: CN 2-12 intact, right upper anf lower hemiparesis from CVA, grade 4  power.   Assessment & Plan  Type 2 diabetes mellitus with diabetic nephropathy, with  long-term current use of insulin (HCC) Needs to re establishe with Endo DASH diet and commitment to daily physical activity for a minimum of 30 minutes discussed and encouraged, as a part of hypertension management. The importance of attaining a healthy weight is also discussed.  BP/Weight 07/12/2018 02/23/2018 02/22/2018 10/06/2017 09/07/2017 3/97/6734 05/27/3788  Systolic BP 240 973 532 992 426 834 196  Diastolic BP 80 76 72 72 78 70 94  Wt. (Lbs) 261 255 256.12 252 253 250 250  BMI 37.45 36.59 36.75 36.16 36.3 35.87 35.87       Essential hypertension Elevated at visit, but has medication on day of visit Re eval in 6 to 8 weeks DASH diet and commitment to daily physical activity for a minimum of 30 minutes discussed and encouraged, as a part of hypertension management. The importance of attaining a healthy weight is also discussed.  BP/Weight 07/12/2018 02/23/2018 02/22/2018 10/06/2017 09/07/2017 07/10/9796 01/18/1193  Systolic BP 174 081 448 185 631 497 026  Diastolic BP 80 76 72 72 78 70 94  Wt. (Lbs) 261 255 256.12 252 253 250 250  BMI 37.45 36.59 36.75 36.16 36.3 35.87 35.87       Abnormality of gait Increased fall risk from hemiparesis and osteoarthritis, home safety and fall risk reduction discussed  Hyperlipidemia LDL goal <100 Hyperlipidemia:Low fat diet discussed and encouraged.   Lipid Panel  Lab Results  Component Value Date   CHOL 91 07/13/2018   HDL 22 (L) 07/13/2018   LDLCALC 43 07/13/2018   LDLDIRECT 50 08/18/2014  TRIG 188 (H) 07/13/2018   CHOLHDL 4.1 07/13/2018   Needs to increase exercisr as able and lower fat, much improved    Longitudinal overcurvature of nails Long thickened toenails in diabeti patient , urgent need for Podiatry, refer  Morbid obesity ([place]) Deteriorated.Obesity linked with hypertension and iDDM  Patient re-educated about  the importance of commitment to a  minimum of 150 minutes of exercise per week as able.  The importance of  healthy food choices with portion control discussed, as well as eating regularly and within a 12 hour window most days. The need to choose "clean , green" food 50 to 75% of the time is discussed, as well as to make water the primary drink and set a goal of 64 ounces water daily.  Encouraged to start a food diary,  and to consider  joining a support group. Sample diet sheets offered. Goals set by the patient for the next several months.   Weight /BMI 07/12/2018 02/23/2018 02/22/2018  WEIGHT 261 lb 255 lb 256 lb 1.9 oz  HEIGHT 5\' 10"  5\' 10"  5\' 10"   BMI 37.45 kg/m2 36.59 kg/m2 36.75 kg/m2

## 2018-07-14 NOTE — Assessment & Plan Note (Signed)
Needs to re establishe with Endo DASH diet and commitment to daily physical activity for a minimum of 30 minutes discussed and encouraged, as a part of hypertension management. The importance of attaining a healthy weight is also discussed.  BP/Weight 07/12/2018 02/23/2018 02/22/2018 10/06/2017 09/07/2017 4/80/1655 07/23/4825  Systolic BP 078 675 449 201 007 121 975  Diastolic BP 80 76 72 72 78 70 94  Wt. (Lbs) 261 255 256.12 252 253 250 250  BMI 37.45 36.59 36.75 36.16 36.3 35.87 35.87

## 2018-07-14 NOTE — Assessment & Plan Note (Signed)
Long thickened toenails in diabeti patient , urgent need for Podiatry, refer

## 2018-07-26 ENCOUNTER — Ambulatory Visit (INDEPENDENT_AMBULATORY_CARE_PROVIDER_SITE_OTHER): Payer: Medicare HMO

## 2018-07-26 VITALS — BP 152/71 | HR 77 | Resp 12 | Ht 70.0 in | Wt 263.0 lb

## 2018-07-26 DIAGNOSIS — Z0000 Encounter for general adult medical examination without abnormal findings: Secondary | ICD-10-CM | POA: Diagnosis not present

## 2018-07-26 NOTE — Progress Notes (Signed)
Subjective:   RAMEL TOBON is a 78 y.o. male who presents for Medicare Annual/Subsequent preventive examination.  Review of Systems:   Cardiac Risk Factors include: advanced age (>13men, >32 women);obesity (BMI >30kg/m2);sedentary lifestyle;smoking/ tobacco exposure;hypertension;diabetes mellitus;male gender;dyslipidemia     Objective:    Vitals: BP (!) 152/71   Pulse 77   Resp 12   Ht 5\' 10"  (1.778 m)   Wt 263 lb (119.3 kg)   SpO2 90% Comment: room air  BMI 37.74 kg/m   Body mass index is 37.74 kg/m.  Advanced Directives 07/26/2018 06/29/2017 06/26/2017 06/25/2017 05/07/2017 03/06/2017 10/28/2016  Does Patient Have a Medical Advance Directive? No No No No No No No  Does patient want to make changes to medical advance directive? - - - - - - -  Would patient like information on creating a medical advance directive? No - Patient declined No - Patient declined No - Patient declined No - Patient declined No - Patient declined - No - Patient declined  Pre-existing out of facility DNR order (yellow form or pink MOST form) - - - - - - -    Tobacco Social History   Tobacco Use  Smoking Status Former Smoker  . Packs/day: 0.50  . Years: 33.00  . Pack years: 16.50  . Types: Cigarettes  . Last attempt to quit: 11/29/2002  . Years since quitting: 15.6  Smokeless Tobacco Never Used     Counseling given: Not Answered   Clinical Intake:  Pre-visit preparation completed: Yes  Pain : No/denies pain Pain Score: 0-No pain     BMI - recorded: 37.74 Nutritional Status: BMI > 30  Obese Nutritional Risks: None Diabetes: Yes  How often do you need to have someone help you when you read instructions, pamphlets, or other written materials from your doctor or pharmacy?: 2 - Rarely What is the last grade level you completed in school?: 12 grade   Interpreter Needed?: No  Information entered by :: Francena Hanly LPN   Past Medical History:  Diagnosis Date  . Abnormality of gait  05/01/2015  . Arthritis    "left leg; knees" (05/07/2017)  . Arthritis of knee   . CAD (coronary artery disease)   . Chronic bronchitis ([place])    "get it q yr"  . Chronic lower back pain    "since I was a teen" (05/07/2017)  . COPD (chronic obstructive pulmonary disease) ([place])   . CVA (cerebral vascular accident) ([place]) 05/2015   R sided weakness (05/07/2017)  . GERD (gastroesophageal reflux disease)   . Gout   . Hyperlipidemia   . Hypertension   . Myocardial infarction ([place]) 1999  . Obesity   . OSA on CPAP   . Pneumonia    "a few times; last time was ~ 06/2013" (05/07/2017)  . Psoriasis   . Tussive syncope 05/01/2015  . Type II diabetes mellitus ([place])   . Vitamin B12 deficiency 05/01/2015   Past Surgical History:  Procedure Laterality Date  . APPENDECTOMY    . CATARACT EXTRACTION W/PHACO Left 10/07/2016   Procedure: CATARACT EXTRACTION PHACO AND INTRAOCULAR LENS PLACEMENT (IOC);  Surgeon: Rutherford Guys, MD;  Location: [facility];  Service: Ophthalmology;  Laterality: Left;  CDE: 13.49  . CATARACT EXTRACTION W/PHACO Right 10/28/2016   Procedure: CATARACT EXTRACTION PHACO AND INTRAOCULAR LENS PLACEMENT (IOC);  Surgeon: Rutherford Guys, MD;  Location: [facility];  Service: Ophthalmology;  Laterality: Right;  CDE: 16.71  . COLONOSCOPY  03/17/2012   Procedure: COLONOSCOPY;  Surgeon:  Rogene Houston, MD;  Location: [facility];  Service: Endoscopy;  Laterality: N/A;  830  . COLONOSCOPY N/A 06/25/2017   Procedure: COLONOSCOPY;  Surgeon: Rogene Houston, MD;  Location: [facility];  Service: Endoscopy;  Laterality: N/A;  [place]- 2009-08/30/2013   "counting today's, I have 5 stents" (08/30/2013)  . CORONARY ANGIOPLASTY WITH STENT PLACEMENT  05/07/2017  . CORONARY STENT INTERVENTION N/A 05/07/2017   Procedure: CORONARY STENT INTERVENTION;  Surgeon: Charolette Forward, MD;  Location: [facility];  Service: Cardiovascular;  Laterality: N/A;  .  CYSTOSCOPY WITH URETHRAL DILATATION N/A 11/26/2012   Procedure: CYSTOSCOPY WITH URETHRAL DILATATION;  Surgeon: Malka So, MD;  Location: [facility];  Service: Urology;  Laterality: N/A;  . LEFT HEART CATH AND CORONARY ANGIOGRAPHY N/A 05/07/2017   Procedure: LEFT HEART CATH AND CORONARY ANGIOGRAPHY;  Surgeon: Charolette Forward, MD;  Location: [place] [facility];  Service: Cardiovascular;  Laterality: N/A;  . LEFT HEART CATHETERIZATION WITH CORONARY ANGIOGRAM N/A 08/30/2013   Procedure: LEFT HEART CATHETERIZATION WITH CORONARY ANGIOGRAM;  Surgeon: Clent Demark, MD;  Location: [facility];  Service: Cardiovascular;  Laterality: N/A;  . PERCUTANEOUS CORONARY STENT INTERVENTION (PCI-S) Right 08/30/2013   Procedure: PERCUTANEOUS CORONARY STENT INTERVENTION (PCI-S);  Surgeon: Clent Demark, MD;  Location: [facility] CATH LAB;  Service: Cardiovascular;  Laterality: Right;   Family History  Problem Relation Age of Onset  . Hypertension Mother   . Diabetes Mother   . Hypertension Father   . Diabetes Brother   . Stroke Daughter   . Arthritis Other        Family History   . Diabetes Other        family History    Social History   Socioeconomic History  . Marital status: Married    Spouse name: Not on file  . Number of children: 2  . Years of education: 82  . Highest education level: 12th grade  Occupational History  . 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 Financial resource strain: Not hard at all  . Food insecurity:    Worry: Never true    Inability: Never true  . Transportation needs:    Medical: No    Non-medical: No  Tobacco Use  . Smoking status: Former Smoker    Packs/day: 0.50    Years: 33.00    Pack years: 16.50    Types: Cigarettes    Last attempt to quit: 11/29/2002    Years since quitting: 15.6  . Smokeless tobacco: Never Used  Substance and Sexual Activity  . Alcohol use: No  . Drug use: No  . Sexual activity: Not Currently  Lifestyle  .  Physical activity:    Days per week: 0 days    Minutes per session: 0 min  . Stress: Not at all  Relationships  . Social connections:    Talks on phone: Once a week    Gets together: Never    Attends religious service: Never    Active member of club or organization: No    Attends meetings of clubs or organizations: Never    Relationship status: Married  Other Topics Concern  . Not on file  Social History Narrative   Patient drinks about 3 cups of soda daily.    Patient is left handed.    Lives alone with wife     Outpatient Encounter Medications  as of 07/26/2018  Medication Sig  . ACCU-CHEK AVIVA PLUS test strip TEST FOUR TIMES DAILY  . acetaminophen (TYLENOL) 500 MG tablet Take 1,000 mg by mouth every 6 (six) hours as needed for moderate pain (left thigh).   Marland Kitchen albuterol (PROVENTIL HFA;VENTOLIN HFA) 108 (90 Base) MCG/ACT inhaler Inhale 2 puffs into the lungs every 4 (four) hours as needed for wheezing or shortness of breath.  . allopurinol (ZYLOPRIM) 300 MG tablet Take 1 tablet (300 mg total) by mouth daily.  Marland Kitchen aspirin EC 81 MG tablet Take 81 mg by mouth daily.  . clopidogrel (PLAVIX) 75 MG tablet TAKE 1 TABLET (75 MG TOTAL) BY MOUTH DAILY.  Marland Kitchen ezetimibe (ZETIA) 10 MG tablet Take 1 tablet (10 mg total) by mouth daily.  . ferrous sulfate 325 (65 FE) MG tablet Take 1 tablet (325 mg total) by mouth 3 (three) times daily with meals.  . insulin NPH-regular Human (NOVOLIN 70/30) (70-30) 100 UNIT/ML injection INJECT 40 UNITS SUBCUTANEOUSLY WITH BREAKFAST AND SUPPER (WHEN GLUCOSE IS ABOVE 90)  . levETIRAcetam (KEPPRA) 500 MG tablet TAKE 1 TABLET (500 MG TOTAL) BY MOUTH 2 (TWO) TIMES DAILY.  Marland Kitchen levothyroxine (SYNTHROID, LEVOTHROID) 88 MCG tablet Take 1 tablet (88 mcg total) by mouth every morning.  Marland Kitchen losartan (COZAAR) 50 MG tablet Take 50 mg by mouth daily.  . meclizine (ANTIVERT) 25 MG tablet Take 1 tablet (25 mg total) by mouth 3 (three) times daily as needed for dizziness.  . metFORMIN  (GLUCOPHAGE) 500 MG tablet Take 1 tablet (500 mg total) by mouth daily with breakfast.  . metoprolol tartrate (LOPRESSOR) 50 MG tablet TAKE 1/2 TABLET TWICE DAILY  . OXYGEN Inhale 2 L into the lungs continuous.  . pantoprazole (PROTONIX) 40 MG tablet TAKE 1 TABLET EVERY DAY BEFORE BREAKFAST  . pravastatin (PRAVACHOL) 40 MG tablet TAKE 2 TABLETS EVERY DAY  . senna (SENOKOT) 8.6 MG tablet Take 1-2 tablets by mouth daily as needed for constipation.   Marland Kitchen spironolactone (ALDACTONE) 25 MG tablet TAKE 1 TABLET (25 MG TOTAL) BY MOUTH DAILY.  Marland Kitchen triamcinolone cream (KENALOG) 0.1 % Apply 1 application topically 2 (two) times daily. Apply twice daily to rash on right elbow  , under a moist wrap for 4 weeks  . vitamin B-12 (CYANOCOBALAMIN) 1000 MCG tablet Take 1,000 mcg by mouth daily.  . Vitamin D, Ergocalciferol, (DRISDOL) 1.25 MG (50000 UT) CAPS capsule TAKE 1 CAPSULE ONCE WEEKLY  . nitroGLYCERIN (NITROSTAT) 0.4 MG SL tablet Place 1 tablet (0.4 mg total) under the tongue every 5 (five) minutes as needed for chest pain.  Marland Kitchen tiotropium (SPIRIVA HANDIHALER) 18 MCG inhalation capsule Place 1 capsule (18 mcg total) into inhaler and inhale daily. (Patient taking differently: Place 18 mcg into inhaler and inhale daily as needed (for shortness of breath or wheezing). )   No facility-administered encounter medications on file as of 07/26/2018.     Activities of Daily Living In your present state of health, do you have any difficulty performing the following activities: 07/26/2018  Hearing? N  Vision? N  Difficulty concentrating or making decisions? Y  Walking or climbing stairs? Y  Dressing or bathing? N  Doing errands, shopping? Y  Preparing Food and eating ? Y  Using the Toilet? N  In the past six months, have you accidently leaked urine? N  Do you have problems with loss of bowel control? N  Managing your Medications? N  Managing your Finances? N  Housekeeping or managing your Housekeeping? Y  Some  recent  data might be hidden    Patient Care Team: Fayrene Helper, MD as PCP - Wardell Honour, MD as Consulting Physician (Pulmonary Disease) Charolette Forward, MD as Consulting Physician (Cardiology) Rutherford Guys, MD as Consulting Physician (Ophthalmology) Kathrynn Ducking, MD as Consulting Physician (Neurology)   Assessment:   This is a routine wellness examination for [place].  Exercise Activities and Dietary recommendations Current Exercise Habits: The patient does not participate in regular exercise at present, Exercise limited by: respiratory conditions(s);orthopedic condition(s);cardiac condition(s)  Goals      Patient Stated   . <enter goal here> (pt-stated)     "Live and let live"       Other   . Exercise 3x per week (30 min per time)     Recommend starting chair exercises 3 times a week for at least 30 minutes at a time as tolerated.    . Patient Stated     Prevent falls        Fall Risk Fall Risk  07/26/2018 07/12/2018 02/23/2018 02/23/2018 02/22/2018  Falls in the past year? 1 0 No No No  Comment - - - - -  Number falls in past yr: 1 0 - - -  Comment - - - - -  Injury with Fall? 0 0 - - -  Comment - - - - -  Risk Factor Category  - - - - -  Risk for fall due to : History of fall(s);Impaired balance/gait;Impaired vision Impaired mobility;Impaired balance/gait - - -  Risk for fall due to: Comment - - - - -  Follow up Falls prevention discussed Falls evaluation completed;Education provided - - -   Is the patient's home free of loose throw rugs in walkways, pet beds, electrical cords, etc?   yes      Grab bars in the bathroom? yes      Handrails on the stairs?   yes      Adequate lighting?   yes  Timed Get Up and Go Performed: Patient able to perform in 8 seconds with the aide of a cane   Depression Screen PHQ 2/9 Scores 07/26/2018 07/12/2018 02/23/2018 02/23/2018  PHQ - 2 Score 0 0 0 0    Cognitive Function MMSE - Mini Mental State Exam 05/01/2015    Orientation to time 5  Orientation to Place 4  Registration 3  Attention/ Calculation 5  Recall 2  Language- name 2 objects 2  Language- repeat 1  Language- follow 3 step command 3  Language- read & follow direction 1  Write a sentence 1  Copy design 0  Total score 27     6CIT Screen 07/26/2018 06/29/2017 07/01/2016  What Year? 0 points 0 points 0 points  What month? 0 points 0 points 0 points  What time? 0 points 0 points 0 points  Count back from 20 0 points 0 points 0 points  Months in reverse 0 points 0 points 0 points  Repeat phrase 2 points 0 points 0 points  Total Score 2 0 0    Immunization History  Administered Date(s) Administered  . Influenza Whole 03/18/2005  . Influenza, High Dose Seasonal PF 02/22/2018  . Influenza,inj,Quad PF,6+ Mos 03/19/2017  . Pneumococcal Conjugate-13 12/08/2013  . Pneumococcal Polysaccharide-23 06/09/2012  . Td 12/04/2003  . Tdap 03/06/2017    Qualifies for Shingles Vaccine? Refused   Screening Tests Health Maintenance  Topic Date Due  . OPHTHALMOLOGY EXAM  09/30/2017  . HEMOGLOBIN A1C  01/11/2019  .  FOOT EXAM  07/15/2019  . TETANUS/TDAP  03/07/2027  . INFLUENZA VACCINE  Completed  . PNA vac Low Risk Adult  Completed   Cancer Screenings: Lung: Low Dose CT Chest recommended if Age 51-80 years, 30 pack-year currently smoking OR have quit w/in 15years. Patient does qualify. Colorectal: up to date   Additional Screenings:  Hepatitis C Screening:needed       Plan:   Continue to prevent falls, increase physical activity   I have personally reviewed and noted the following in the patient's chart:   . Medical and social history . Use of alcohol, tobacco or illicit drugs  . Current medications and supplements . Functional ability and status . Nutritional status . Physical activity . Advanced directives . List of other physicians . Hospitalizations, surgeries, and ER visits in previous 12 months . Vitals . Screenings to  include cognitive, depression, and falls . Referrals and appointments  In addition, I have reviewed and discussed with patient certain preventive protocols, quality metrics, and best practice recommendations. A written personalized care plan for preventive services as well as general preventive health recommendations were provided to patient.     Hayden Pedro, LPN  08/22/1458

## 2018-07-26 NOTE — Patient Instructions (Signed)
[name] , Thank you for taking time to come for your Medicare Wellness Visit. I appreciate your ongoing commitment to your health goals. Please review the following plan we discussed and let me know if I can assist you in the future.   Screening recommendations/referrals: Colonoscopy: complete 2019  Recommended yearly ophthalmology/optometry visit for glaucoma screening and checkup Recommended yearly dental visit for hygiene and checkup  Vaccinations: Influenza vaccine: up to date  Pneumococcal vaccine: up to date  Tdap vaccine: up to date  Shingles vaccine: refused   Advanced directives: refused   Conditions/risks identified: type 2 diabetes, impaired mobility, vision impaired, hypertension, COPD   Next appointment: Wellness visit in one year   Preventive Care 90 Years and Older, Male Preventive care refers to lifestyle choices and visits with your health care provider that can promote health and wellness. What does preventive care include?  A yearly physical exam. This is also called an annual well check.  Dental exams once or twice a year.  Routine eye exams. Ask your health care provider how often you should have your eyes checked.  Personal lifestyle choices, including:  Daily care of your teeth and gums.  Regular physical activity.  Eating a healthy diet.  Avoiding tobacco and drug use.  Limiting alcohol use.  Practicing safe sex.  Taking low doses of aspirin every day.  Taking vitamin and mineral supplements as recommended by your health care provider. What happens during an annual well check? The services and screenings done by your health care provider during your annual well check will depend on your age, overall health, lifestyle risk factors, and family history of disease. Counseling  Your health care provider may ask you questions about your:  Alcohol use.  Tobacco use.  Drug use.  Emotional well-being.  Home and relationship  well-being.  Sexual activity.  Eating habits.  History of falls.  Memory and ability to understand (cognition).  Work and work Statistician. Screening  You may have the following tests or measurements:  Height, weight, and BMI.  Blood pressure.  Lipid and cholesterol levels. These may be checked every 5 years, or more frequently if you are over 76 years old.  Skin check.  Lung cancer screening. You may have this screening every year starting at age 30 if you have a 30-pack-year history of smoking and currently smoke or have quit within the past 15 years.  Fecal occult blood test (FOBT) of the stool. You may have this test every year starting at age 54.  Flexible sigmoidoscopy or colonoscopy. You may have a sigmoidoscopy every 5 years or a colonoscopy every 10 years starting at age 27.  Prostate cancer screening. Recommendations will vary depending on your family history and other risks.  Hepatitis C blood test.  Hepatitis B blood test.  Sexually transmitted disease (STD) testing.  Diabetes screening. This is done by checking your blood sugar (glucose) after you have not eaten for a while (fasting). You may have this done every 1-3 years.  Abdominal aortic aneurysm (AAA) screening. You may need this if you are a current or former smoker.  Osteoporosis. You may be screened starting at age 50 if you are at high risk. Talk with your health care provider about your test results, treatment options, and if necessary, the need for more tests. Vaccines  Your health care provider may recommend certain vaccines, such as:  Influenza vaccine. This is recommended every year.  Tetanus, diphtheria, and acellular pertussis (Tdap, Td) vaccine. You may  need a Td booster every 10 years.  Zoster vaccine. You may need this after age 76.  Pneumococcal 13-valent conjugate (PCV13) vaccine. One dose is recommended after age 76.  Pneumococcal polysaccharide (PPSV23) vaccine. One dose is  recommended after age 17. Talk to your health care provider about which screenings and vaccines you need and how often you need them. This information is not intended to replace advice given to you by your health care provider. Make sure you discuss any questions you have with your health care provider. Document Released: 06/01/2015 Document Revised: 01/23/2016 Document Reviewed: 03/06/2015 Elsevier Interactive Patient Education  2017 [place] Prevention in the Home Falls can cause injuries. They can happen to people of all ages. There are many things you can do to make your home safe and to help prevent falls. What can I do on the outside of my home?  Regularly fix the edges of walkways and driveways and fix any cracks.  Remove anything that might make you trip as you walk through a door, such as a raised step or threshold.  Trim any bushes or trees on the path to your home.  Use bright outdoor lighting.  Clear any walking paths of anything that might make someone trip, such as rocks or tools.  Regularly check to see if handrails are loose or broken. Make sure that both sides of any steps have handrails.  Any raised decks and porches should have guardrails on the edges.  Have any leaves, snow, or ice cleared regularly.  Use sand or salt on walking paths during winter.  Clean up any spills in your garage right away. This includes oil or grease spills. What can I do in the bathroom?  Use night lights.  Install grab bars by the toilet and in the tub and shower. Do not use towel bars as grab bars.  Use non-skid mats or decals in the tub or shower.  If you need to sit down in the shower, use a plastic, non-slip stool.  Keep the floor dry. Clean up any water that spills on the floor as soon as it happens.  Remove soap buildup in the tub or shower regularly.  Attach bath mats securely with double-sided non-slip rug tape.  Do not have throw rugs and other things on  the floor that can make you trip. What can I do in the bedroom?  Use night lights.  Make sure that you have a light by your bed that is easy to reach.  Do not use any sheets or blankets that are too big for your bed. They should not hang down onto the floor.  Have a firm chair that has side arms. You can use this for support while you get dressed.  Do not have throw rugs and other things on the floor that can make you trip. What can I do in the kitchen?  Clean up any spills right away.  Avoid walking on wet floors.  Keep items that you use a lot in easy-to-reach places.  If you need to reach something above you, use a strong step stool that has a grab bar.  Keep electrical cords out of the way.  Do not use floor polish or wax that makes floors slippery. If you must use wax, use non-skid floor wax.  Do not have throw rugs and other things on the floor that can make you trip. What can I do with my stairs?  Do not leave any items on  the stairs.  Make sure that there are handrails on both sides of the stairs and use them. Fix handrails that are broken or loose. Make sure that handrails are as long as the stairways.  Check any carpeting to make sure that it is firmly attached to the stairs. Fix any carpet that is loose or worn.  Avoid having throw rugs at the top or bottom of the stairs. If you do have throw rugs, attach them to the floor with carpet tape.  Make sure that you have a light switch at the top of the stairs and the bottom of the stairs. If you do not have them, ask someone to add them for you. What else can I do to help prevent falls?  Wear shoes that:  Do not have high heels.  Have rubber bottoms.  Are comfortable and fit you well.  Are closed at the toe. Do not wear sandals.  If you use a stepladder:  Make sure that it is fully opened. Do not climb a closed stepladder.  Make sure that both sides of the stepladder are locked into place.  Ask someone to  hold it for you, if possible.  Clearly mark and make sure that you can see:  Any grab bars or handrails.  First and last steps.  Where the edge of each step is.  Use tools that help you move around (mobility aids) if they are needed. These include:  Canes.  Walkers.  Scooters.  Crutches.  Turn on the lights when you go into a dark area. Replace any light bulbs as soon as they burn out.  Set up your furniture so you have a clear path. Avoid moving your furniture around.  If any of your floors are uneven, fix them.  If there are any pets around you, be aware of where they are.  Review your medicines with your doctor. Some medicines can make you feel dizzy. This can increase your chance of falling. Ask your doctor what other things that you can do to help prevent falls. This information is not intended to replace advice given to you by your health care provider. Make sure you discuss any questions you have with your health care provider. Document Released: 03/01/2009 Document Revised: 10/11/2015 Document Reviewed: 06/09/2014 Elsevier Interactive Patient Education  2017 [employer].

## 2018-07-29 ENCOUNTER — Other Ambulatory Visit: Payer: Self-pay | Admitting: Family Medicine

## 2018-08-02 DIAGNOSIS — E119 Type 2 diabetes mellitus without complications: Secondary | ICD-10-CM | POA: Diagnosis not present

## 2018-08-02 DIAGNOSIS — E785 Hyperlipidemia, unspecified: Secondary | ICD-10-CM | POA: Diagnosis not present

## 2018-08-02 DIAGNOSIS — I5030 Unspecified diastolic (congestive) heart failure: Secondary | ICD-10-CM | POA: Diagnosis not present

## 2018-08-02 DIAGNOSIS — I10 Essential (primary) hypertension: Secondary | ICD-10-CM | POA: Diagnosis not present

## 2018-08-02 DIAGNOSIS — R0609 Other forms of dyspnea: Secondary | ICD-10-CM | POA: Diagnosis not present

## 2018-08-02 DIAGNOSIS — I208 Other forms of angina pectoris: Secondary | ICD-10-CM | POA: Diagnosis not present

## 2018-08-02 DIAGNOSIS — Z8673 Personal history of transient ischemic attack (TIA), and cerebral infarction without residual deficits: Secondary | ICD-10-CM | POA: Diagnosis not present

## 2018-08-05 ENCOUNTER — Other Ambulatory Visit: Payer: Self-pay

## 2018-08-05 ENCOUNTER — Encounter: Payer: Self-pay | Admitting: "Endocrinology

## 2018-08-05 ENCOUNTER — Ambulatory Visit (INDEPENDENT_AMBULATORY_CARE_PROVIDER_SITE_OTHER): Payer: Medicare HMO | Admitting: "Endocrinology

## 2018-08-05 VITALS — BP 140/67 | HR 84 | Ht 70.0 in | Wt 262.0 lb

## 2018-08-05 DIAGNOSIS — E782 Mixed hyperlipidemia: Secondary | ICD-10-CM | POA: Diagnosis not present

## 2018-08-05 DIAGNOSIS — E1165 Type 2 diabetes mellitus with hyperglycemia: Secondary | ICD-10-CM

## 2018-08-05 DIAGNOSIS — E038 Other specified hypothyroidism: Secondary | ICD-10-CM

## 2018-08-05 DIAGNOSIS — I10 Essential (primary) hypertension: Secondary | ICD-10-CM | POA: Diagnosis not present

## 2018-08-05 MED ORDER — LEVOTHYROXINE SODIUM 100 MCG PO TABS: 100.0000 ug | ORAL_TABLET | Freq: Every day | ORAL | 6 refills | Status: DC

## 2018-08-05 NOTE — Patient Instructions (Signed)

## 2018-08-05 NOTE — Progress Notes (Signed)
Endocrinology follow-up note    Subjective:    Patient ID: [name], male    DOB: [birth year], PCP Fayrene Helper, MD   Past Medical History:  Diagnosis Date  . Abnormality of gait 05/01/2015  . Arthritis    "left leg; knees" (05/07/2017)  . Arthritis of knee   . CAD (coronary artery disease)   . Chronic bronchitis ([place])    "get it q yr"  . Chronic lower back pain    "since I was a teen" (05/07/2017)  . COPD (chronic obstructive pulmonary disease) ([place])   . CVA (cerebral vascular accident) ([place]) 05/2015   R sided weakness (05/07/2017)  . GERD (gastroesophageal reflux disease)   . Gout   . Hyperlipidemia   . Hypertension   . Myocardial infarction ([place]) 1999  . Obesity   . OSA on CPAP   . Pneumonia    "a few times; last time was ~ 06/2013" (05/07/2017)  . Psoriasis   . Tussive syncope 05/01/2015  . Type II diabetes mellitus ([place])   . Vitamin B12 deficiency 05/01/2015   Past Surgical History:  Procedure Laterality Date  . APPENDECTOMY    . CATARACT EXTRACTION W/PHACO Left 10/07/2016   Procedure: CATARACT EXTRACTION PHACO AND INTRAOCULAR LENS PLACEMENT (IOC);  Surgeon: Rutherford Guys, MD;  Location: [facility];  Service: Ophthalmology;  Laterality: Left;  CDE: 13.49  . CATARACT EXTRACTION W/PHACO Right 10/28/2016   Procedure: CATARACT EXTRACTION PHACO AND INTRAOCULAR LENS PLACEMENT (IOC);  Surgeon: Rutherford Guys, MD;  Location: [facility];  Service: Ophthalmology;  Laterality: Right;  CDE: 16.71  . COLONOSCOPY  03/17/2012   Procedure: COLONOSCOPY;  Surgeon: Rogene Houston, MD;  Location: [facility];  Service: Endoscopy;  Laterality: N/A;  830  . COLONOSCOPY N/A 06/25/2017   Procedure: COLONOSCOPY;  Surgeon: Rogene Houston, MD;  Location: [facility];  Service: Endoscopy;  Laterality: N/A;  [place]- 2009-08/30/2013   "counting today's, I have 5 stents" (08/30/2013)  . CORONARY ANGIOPLASTY WITH STENT PLACEMENT  05/07/2017   . CORONARY STENT INTERVENTION N/A 05/07/2017   Procedure: CORONARY STENT INTERVENTION;  Surgeon: Charolette Forward, MD;  Location: [facility];  Service: Cardiovascular;  Laterality: N/A;  . CYSTOSCOPY WITH URETHRAL DILATATION N/A 11/26/2012   Procedure: CYSTOSCOPY WITH URETHRAL DILATATION;  Surgeon: Malka So, MD;  Location: [facility];  Service: Urology;  Laterality: N/A;  . LEFT HEART CATH AND CORONARY ANGIOGRAPHY N/A 05/07/2017   Procedure: LEFT HEART CATH AND CORONARY ANGIOGRAPHY;  Surgeon: Charolette Forward, MD;  Location: [facility];  Service: Cardiovascular;  Laterality: N/A;  . LEFT HEART CATHETERIZATION WITH CORONARY ANGIOGRAM N/A 08/30/2013   Procedure: LEFT HEART CATHETERIZATION WITH CORONARY ANGIOGRAM;  Surgeon: Clent Demark, MD;  Location: [facility];  Service: Cardiovascular;  Laterality: N/A;  . PERCUTANEOUS CORONARY STENT INTERVENTION (PCI-S) Right 08/30/2013   Procedure: PERCUTANEOUS CORONARY STENT INTERVENTION (PCI-S);  Surgeon: Clent Demark, MD;  Location: [facility];  Service: Cardiovascular;  Laterality: Right;   Social History   Socioeconomic History  . Marital status: Married    Spouse name: Not on file  . Number of children: 2  . Years of education: 18  . Highest education level: 12th grade  Occupational History  . 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 Financial resource strain: Not hard at all  . Food insecurity:    Worry: Never  true    Inability: Never true  . Transportation needs:    Medical: No    Non-medical: No  Tobacco Use  . Smoking status: Former Smoker    Packs/day: 0.50    Years: 33.00    Pack years: 16.50    Types: Cigarettes    Last attempt to quit: 11/29/2002    Years since quitting: 15.6  . Smokeless tobacco: Never Used  Substance and Sexual Activity  . Alcohol use: No  . Drug use: No  . Sexual activity: Not Currently  Lifestyle  . Physical activity:    Days per week: 0 days    Minutes  per session: 0 min  . Stress: Not at all  Relationships  . Social connections:    Talks on phone: Once a week    Gets together: Never    Attends religious service: Never    Active member of club or organization: No    Attends meetings of clubs or organizations: Never    Relationship status: Married  Other Topics Concern  . Not on file  Social History Narrative   Patient drinks about 3 cups of soda daily.    Patient is left handed.    Lives alone with wife    Outpatient Encounter Medications as of 08/05/2018  Medication Sig  . ACCU-CHEK AVIVA PLUS test strip TEST FOUR TIMES DAILY  . acetaminophen (TYLENOL) 500 MG tablet Take 1,000 mg by mouth every 6 (six) hours as needed for moderate pain (left thigh).   Marland Kitchen albuterol (PROVENTIL HFA;VENTOLIN HFA) 108 (90 Base) MCG/ACT inhaler Inhale 2 puffs into the lungs every 4 (four) hours as needed for wheezing or shortness of breath.  . allopurinol (ZYLOPRIM) 300 MG tablet TAKE 1 TABLET (300 MG TOTAL) BY MOUTH DAILY.  Marland Kitchen aspirin EC 81 MG tablet Take 81 mg by mouth daily.  . clopidogrel (PLAVIX) 75 MG tablet TAKE 1 TABLET (75 MG TOTAL) BY MOUTH DAILY.  Marland Kitchen ezetimibe (ZETIA) 10 MG tablet Take 1 tablet (10 mg total) by mouth daily.  . ferrous sulfate 325 (65 FE) MG tablet Take 1 tablet (325 mg total) by mouth 3 (three) times daily with meals.  . insulin NPH-regular Human (NOVOLIN 70/30) (70-30) 100 UNIT/ML injection INJECT 40 UNITS SUBCUTANEOUSLY WITH BREAKFAST AND SUPPER (WHEN GLUCOSE IS ABOVE 90)  . levETIRAcetam (KEPPRA) 500 MG tablet TAKE 1 TABLET (500 MG TOTAL) BY MOUTH 2 (TWO) TIMES DAILY.  Marland Kitchen levothyroxine (SYNTHROID, LEVOTHROID) 100 MCG tablet Take 1 tablet (100 mcg total) by mouth daily before breakfast.  . losartan (COZAAR) 50 MG tablet Take 50 mg by mouth daily.  . meclizine (ANTIVERT) 25 MG tablet Take 1 tablet (25 mg total) by mouth 3 (three) times daily as needed for dizziness.  . metFORMIN (GLUCOPHAGE) 500 MG tablet Take 1 tablet (500 mg  total) by mouth daily with breakfast.  . metoprolol tartrate (LOPRESSOR) 50 MG tablet TAKE 1/2 TABLET TWICE DAILY  . nitroGLYCERIN (NITROSTAT) 0.4 MG SL tablet Place 1 tablet (0.4 mg total) under the tongue every 5 (five) minutes as needed for chest pain.  . OXYGEN Inhale 2 L into the lungs continuous.  . pantoprazole (PROTONIX) 40 MG tablet TAKE 1 TABLET EVERY DAY BEFORE BREAKFAST  . pravastatin (PRAVACHOL) 40 MG tablet TAKE 2 TABLETS EVERY DAY  . senna (SENOKOT) 8.6 MG tablet Take 1-2 tablets by mouth daily as needed for constipation.   Marland Kitchen spironolactone (ALDACTONE) 25 MG tablet TAKE 1 TABLET (25 MG TOTAL) BY MOUTH DAILY.  Marland Kitchen  tiotropium (SPIRIVA HANDIHALER) 18 MCG inhalation capsule Place 1 capsule (18 mcg total) into inhaler and inhale daily. (Patient taking differently: Place 18 mcg into inhaler and inhale daily as needed (for shortness of breath or wheezing). )  . triamcinolone cream (KENALOG) 0.1 % Apply 1 application topically 2 (two) times daily. Apply twice daily to rash on right elbow  , under a moist wrap for 4 weeks  . vitamin B-12 (CYANOCOBALAMIN) 1000 MCG tablet Take 1,000 mcg by mouth daily.  . Vitamin D, Ergocalciferol, (DRISDOL) 1.25 MG (50000 UT) CAPS capsule TAKE 1 CAPSULE ONCE WEEKLY  . [DISCONTINUED] levothyroxine (SYNTHROID, LEVOTHROID) 88 MCG tablet Take 1 tablet (88 mcg total) by mouth every morning.   No facility-administered encounter medications on file as of 08/05/2018.    ALLERGIES: No Known Allergies VACCINATION STATUS: Immunization History  Administered Date(s) Administered  . Influenza Whole 03/18/2005  . Influenza, High Dose Seasonal PF 02/22/2018  . Influenza,inj,Quad PF,6+ Mos 03/19/2017  . Pneumococcal Conjugate-13 12/08/2013  . Pneumococcal Polysaccharide-23 06/09/2012  . Td 12/04/2003  . Tdap 03/06/2017    Diabetes  He presents for his follow-up diabetic visit. He has type 2 diabetes mellitus. Onset time: He was diagnosed at approximate age of 18  years. His disease course has been improving. There are no hypoglycemic associated symptoms. Pertinent negatives for hypoglycemia include no confusion, headaches, pallor or seizures. Pertinent negatives for diabetes include no chest pain, no fatigue, no polydipsia, no polyphagia, no polyuria and no weakness. Symptoms are improving. Diabetic complications include a CVA, heart disease, nephropathy and peripheral neuropathy. Risk factors for coronary artery disease include dyslipidemia, diabetes mellitus, male sex, obesity, tobacco exposure and sedentary lifestyle. Current diabetic treatment includes insulin injections. He is compliant with treatment most of the time. His weight is fluctuating minimally. He is following a generally unhealthy diet. When asked about meal planning, he reported none. His home blood glucose trend is increasing steadily. His breakfast blood glucose range is generally 140-180 mg/dl. His dinner blood glucose range is generally 140-180 mg/dl. His overall blood glucose range is 140-180 mg/dl. An ACE inhibitor/angiotensin II receptor blocker is being taken. Eye exam is current.  Hyperlipidemia  This is a chronic problem. The current episode started more than 1 year ago. The problem is controlled. Exacerbating diseases include diabetes, hypothyroidism and obesity. Associated symptoms include shortness of breath. Pertinent negatives include no chest pain or myalgias. Current antihyperlipidemic treatment includes statins. Risk factors for coronary artery disease include diabetes mellitus, dyslipidemia, hypertension, male sex and a sedentary lifestyle.  Hypertension  This is a chronic problem. The current episode started more than 1 year ago. The problem is controlled. Associated symptoms include shortness of breath. Pertinent negatives include no chest pain, headaches, neck pain or palpitations. Risk factors for coronary artery disease include dyslipidemia, diabetes mellitus, sedentary lifestyle  and male gender. Past treatments include ACE inhibitors. Hypertensive end-organ damage includes CAD/MI and CVA. Identifiable causes of hypertension include a thyroid problem.  Thyroid Problem  Presents for follow-up visit. Patient reports no constipation, diarrhea, fatigue or palpitations. The symptoms have been stable. Past treatments include levothyroxine. His past medical history is significant for diabetes and hyperlipidemia.     Review of Systems  Constitutional: Negative for chills, fatigue, fever and unexpected weight change.  HENT: Negative for dental problem, mouth sores and trouble swallowing.   Eyes: Negative for visual disturbance.  Respiratory: Positive for cough, shortness of breath and wheezing. Negative for choking and chest tightness.   Cardiovascular: Negative for chest  pain, palpitations and leg swelling.  Gastrointestinal: Negative for abdominal distention, abdominal pain, constipation, diarrhea, nausea and vomiting.  Endocrine: Negative for polydipsia, polyphagia and polyuria.  Genitourinary: Negative for dysuria, flank pain, hematuria and urgency.  Musculoskeletal: Positive for gait problem. Negative for back pain, myalgias and neck pain.       He walks with a walker due to recent CVA.  Skin: Negative for pallor, rash and wound.  Neurological: Negative for seizures, syncope, weakness, numbness and headaches.  Psychiatric/Behavioral: Negative.  Negative for confusion and dysphoric mood.    Objective:    BP 140/67   Pulse 84   Ht 5\' 10"  (1.778 m)   Wt 262 lb (118.8 kg)   BMI 37.59 kg/m   Wt Readings from Last 3 Encounters:  08/05/18 262 lb (118.8 kg)  07/26/18 263 lb (119.3 kg)  07/12/18 261 lb (118.4 kg)    Physical Exam  Constitutional: He is oriented to person, place, and time. He appears well-developed and well-nourished. He is cooperative. No distress.  HENT:  Head: Normocephalic and atraumatic.  Eyes: EOM are normal.  Neck: Normal range of motion.  Neck supple. No tracheal deviation present. No thyromegaly present.  Cardiovascular: Normal rate, S1 normal, S2 normal and normal heart sounds. Exam reveals no gallop.  No murmur heard. Pulses:      Dorsalis pedis pulses are 1+ on the right side and 1+ on the left side.       Posterior tibial pulses are 1+ on the right side and 1+ on the left side.  Pulmonary/Chest: Breath sounds normal. No respiratory distress. He has no wheezes.  Abdominal: Soft. Bowel sounds are normal. He exhibits no distension. There is no abdominal tenderness. There is no guarding and no CVA tenderness.  Musculoskeletal:        General: Edema present.     Right shoulder: He exhibits no swelling and no deformity.     Comments:  Mild bilateral pedal and lower leg edema.  Neurological: He is alert and oriented to person, place, and time. He has normal strength and normal reflexes. No cranial nerve deficit or sensory deficit. Gait normal.  Skin: Skin is warm and dry. No rash noted. No cyanosis. Nails show no clubbing.  Psychiatric: He has a normal mood and affect. His speech is normal and behavior is normal. Judgment and thought content normal. Cognition and memory are normal.    CMP     Component Value Date/Time   NA 139 07/13/2018 0916   K 4.3 07/13/2018 0916   CL 101 07/13/2018 0916   CO2 29 07/13/2018 0916   GLUCOSE 140 (H) 07/13/2018 0916   BUN 22 07/13/2018 0916   CREATININE 1.20 (H) 07/13/2018 0916   CALCIUM 9.0 07/13/2018 0916   PROT 6.7 07/13/2018 0916   ALBUMIN 3.8 04/20/2017 1130   AST 9 (L) 07/13/2018 0916   ALT 6 (L) 07/13/2018 0916   ALKPHOS 70 04/20/2017 1130   BILITOT 0.3 07/13/2018 0916   GFRNONAA 58 (L) 07/13/2018 0916   GFRAA 68 07/13/2018 0916     Diabetic Labs (most recent): Lab Results  Component Value Date   HGBA1C 7.3 (H) 07/13/2018   HGBA1C 8.3 (H) 02/15/2018   HGBA1C 7.9 (H) 08/31/2017     Lipid Panel ( most recent) Lipid Panel     Component Value Date/Time   CHOL 91  07/13/2018 0916   TRIG 188 (H) 07/13/2018 0916   HDL 22 (L) 07/13/2018 0916   CHOLHDL 4.1 07/13/2018 0916  VLDL 35 04/20/2017 1130   LDLCALC 43 07/13/2018 0916   LDLDIRECT 50 08/18/2014 0737      Assessment & Plan:   1. Type 2 diabetes mellitus with multiple competitions including diabetic nephropathy -His diabetes is  complicated by coronary artery disease, CVA, CKD and patient remains at a high risk for more acute and chronic complications of diabetes which include CAD, CVA, CKD, retinopathy, and neuropathy. These are all discussed in detail with the patient.  -Mr. Kearley returns with improved glycemic profile and A1c of 7.3% improving from 8.3%.    -He did not document no report major hypoglycemia.       -  Recent labs reviewed.  - I have re-counseled the patient on diet management and weight loss  by adopting a carbohydrate restricted / protein rich  Diet.  - Patient admits there is a room for improvement in his diet and drink choices. -  Suggestion is made for him to avoid simple carbohydrates  from his diet including Cakes, Sweet Desserts / Pastries, Ice Cream, Soda (diet and regular), Sweet Tea, Candies, Chips, Cookies, Store Bought Juices, Alcohol in Excess of  1-2 drinks a day, Artificial Sweeteners, and "Sugar-free" Products. This will help patient to have stable blood glucose profile and potentially avoid unintended weight gain.  - Patient is advised to stick to a routine mealtimes to eat 3 meals  a day and avoid unnecessary snacks ( to snack only to correct hypoglycemia).  - I have approached patient with the following individualized plan to manage diabetes and patient agrees.  - He has done reasonably very well on premixed Novolin 70/30. He wishes to stay on the same regimen for cost reasons.  -He is advised to continue Novolin 70/30   40 units withakfast and 30 units with supper for pre-meal glucose of greater than 90 mg/dL. He is advised to continue strict   monitoring of glucose  4 times a day-before meals and at bedtime.   -He is warned  not to take insulin without proper monitoring per orders.  -Patient is encouraged to call clinic for blood glucose levels less than 70 or above 300 mg /dl. -He he is advised to continue low-dose metformin, 500 mg p.o. daily after breakfast.     - Patient specific target  for A1c; LDL, HDL, Triglycerides, and  Waist Circumference were discussed in detail.  2) BP/HTN: His blood pressure is controlled to target.   He is advised to continue his current blood pressure medications including losartan 50 mg p.o. daily.    3) Lipids/HPL: His recent lipid panel showed controlled LDL at 55.  He is advised to continue pravastatin 40 mg p.o. nightly.  4)  Weight/Diet: CDE consult in progress, exercise, and carbohydrates information provided.  5) hypothyroidism:  -His previsit thyroid function tests are consistent with under replacement.  I discussed and increased his levothyroxine to 100 mcg p.o. every morning.     - We discussed about the correct intake of his thyroid hormone, on empty stomach at fasting, with water, separated by at least 30 minutes from breakfast and other medications,  and separated by more than 4 hours from calcium, iron, multivitamins, acid reflux medications (PPIs). -Patient is made aware of the fact that thyroid hormone replacement is needed for life, dose to be adjusted by periodic monitoring of thyroid function tests.   6) Chronic Care/Health Maintenance:  -Patient is on ACEI/ARB and Statin medications and encouraged to continue to follow up with Ophthalmology, nephrology,  Podiatrist at least yearly or according to recommendations, and advised to  stay away from smoking. I have recommended yearly flu vaccine and pneumonia vaccination at least every 5 years;  and  sleep for at least 7 hours a day.  - I advised him to pick up compression stockings to wear on bilateral lower extremities during the  times, to help with dependent edema on bilateral lower extremities.   - I advised patient to maintain close follow up with Fayrene Helper, MD for primary care needs.  - Time spent with the patient: 25 min, of which >50% was spent in reviewing his blood glucose logs , discussing his hypoglycemia and hyperglycemia episodes, reviewing his current and  previous labs / studies and medications  doses and developing a plan to avoid hypoglycemia and hyperglycemia. Please refer to Patient Instructions for Blood Glucose Monitoring and Insulin/Medications Dosing Guide"  in media tab for additional information. Please  also refer to " Patient Self Inventory" in the Media  tab for reviewed elements of pertinent patient history.  Harlow Mares participated in the discussions, expressed understanding, and voiced agreement with the above plans.  All questions were answered to his satisfaction. he is encouraged to contact clinic should he have any questions or concerns prior to his return visit.  Follow up plan: -Return in about 6 months (around 02/05/2019) for Follow up with Pre-visit Labs, Meter, and Logs.  Glade Lloyd, MD Phone: [phone]  Fax: [phone]   -  This note was partially dictated with voice recognition software. Similar sounding words can be transcribed inadequately or may not  be corrected upon review.  08/05/2018, 1:17 PM

## 2018-08-07 DIAGNOSIS — Z8673 Personal history of transient ischemic attack (TIA), and cerebral infarction without residual deficits: Secondary | ICD-10-CM | POA: Diagnosis not present

## 2018-08-07 DIAGNOSIS — G4733 Obstructive sleep apnea (adult) (pediatric): Secondary | ICD-10-CM | POA: Diagnosis not present

## 2018-08-07 DIAGNOSIS — J441 Chronic obstructive pulmonary disease with (acute) exacerbation: Secondary | ICD-10-CM | POA: Diagnosis not present

## 2018-08-07 DIAGNOSIS — I5030 Unspecified diastolic (congestive) heart failure: Secondary | ICD-10-CM | POA: Diagnosis not present

## 2018-08-18 ENCOUNTER — Other Ambulatory Visit: Payer: Self-pay

## 2018-08-18 ENCOUNTER — Encounter: Payer: Self-pay | Admitting: Podiatry

## 2018-08-18 ENCOUNTER — Ambulatory Visit: Payer: Medicare HMO | Admitting: Podiatry

## 2018-08-18 DIAGNOSIS — E119 Type 2 diabetes mellitus without complications: Secondary | ICD-10-CM | POA: Diagnosis not present

## 2018-08-18 DIAGNOSIS — B351 Tinea unguium: Secondary | ICD-10-CM | POA: Diagnosis not present

## 2018-08-18 DIAGNOSIS — M79675 Pain in left toe(s): Secondary | ICD-10-CM

## 2018-08-18 DIAGNOSIS — M79674 Pain in right toe(s): Secondary | ICD-10-CM

## 2018-08-18 NOTE — Progress Notes (Addendum)
Complaint:  Visit Type: Patient returns to my office for continued preventative foot care services. Complaint: Patient states" my nails have grown long and thick and become painful to walk and wear shoes" Patient has been diagnosed with DM with no foot complications. The patient presents for preventative foot care services. No changes to ROS  Podiatric Exam: Vascular: dorsalis pedis and posterior tibial pulses are palpable bilateral. Capillary return is immediate. Temperature gradient is WNL. Skin turgor WNL  Sensorium: Normal Semmes Weinstein monofilament test. Normal tactile sensation bilaterally. Nail Exam: Pt has thick disfigured discolored nails with subungual debris noted bilateral entire nail hallux through fifth toenails Ulcer Exam: There is no evidence of ulcer or pre-ulcerative changes or infection. Orthopedic Exam: Muscle tone and strength are WNL. No limitations in general ROM. No crepitus or effusions noted. Foot type and digits show no abnormalities. Bony prominences are unremarkable. Skin: No Porokeratosis. No infection or ulcers.  Crusty skin on toes.  Diagnosis:  Onychomycosis, , Pain in right toe, pain in left toes  Treatment & Plan Procedures and Treatment: Consent by patient was obtained for treatment procedures.   Debridement of mycotic and hypertrophic toenails, 1 through 5 bilateral and clearing of subungual debris. No ulceration, no infection noted.  Return Visit-Office Procedure: Patient instructed to return to the office for a follow up visit 3 months for continued evaluation and treatment.    Gardiner Barefoot DPM

## 2018-08-27 ENCOUNTER — Other Ambulatory Visit: Payer: Self-pay | Admitting: "Endocrinology

## 2018-08-30 ENCOUNTER — Other Ambulatory Visit: Payer: Self-pay | Admitting: Family Medicine

## 2018-09-07 DIAGNOSIS — J441 Chronic obstructive pulmonary disease with (acute) exacerbation: Secondary | ICD-10-CM | POA: Diagnosis not present

## 2018-09-07 DIAGNOSIS — Z8673 Personal history of transient ischemic attack (TIA), and cerebral infarction without residual deficits: Secondary | ICD-10-CM | POA: Diagnosis not present

## 2018-09-07 DIAGNOSIS — I5030 Unspecified diastolic (congestive) heart failure: Secondary | ICD-10-CM | POA: Diagnosis not present

## 2018-09-07 DIAGNOSIS — G4733 Obstructive sleep apnea (adult) (pediatric): Secondary | ICD-10-CM | POA: Diagnosis not present

## 2018-09-15 ENCOUNTER — Other Ambulatory Visit: Payer: Self-pay | Admitting: Family Medicine

## 2018-10-04 ENCOUNTER — Other Ambulatory Visit: Payer: Self-pay

## 2018-10-04 MED ORDER — ACCU-CHEK AVIVA PLUS W/DEVICE KIT: 1.0000 | PACK | Freq: Two times a day (BID) | 0 refills | Status: DC

## 2018-10-07 DIAGNOSIS — Z8673 Personal history of transient ischemic attack (TIA), and cerebral infarction without residual deficits: Secondary | ICD-10-CM | POA: Diagnosis not present

## 2018-10-07 DIAGNOSIS — G4733 Obstructive sleep apnea (adult) (pediatric): Secondary | ICD-10-CM | POA: Diagnosis not present

## 2018-10-07 DIAGNOSIS — J441 Chronic obstructive pulmonary disease with (acute) exacerbation: Secondary | ICD-10-CM | POA: Diagnosis not present

## 2018-10-07 DIAGNOSIS — I5030 Unspecified diastolic (congestive) heart failure: Secondary | ICD-10-CM | POA: Diagnosis not present

## 2018-10-21 ENCOUNTER — Other Ambulatory Visit: Payer: Self-pay | Admitting: "Endocrinology

## 2018-10-21 ENCOUNTER — Other Ambulatory Visit: Payer: Self-pay | Admitting: Family Medicine

## 2018-10-21 DIAGNOSIS — I2510 Atherosclerotic heart disease of native coronary artery without angina pectoris: Secondary | ICD-10-CM

## 2018-11-07 DIAGNOSIS — G4733 Obstructive sleep apnea (adult) (pediatric): Secondary | ICD-10-CM | POA: Diagnosis not present

## 2018-11-07 DIAGNOSIS — J441 Chronic obstructive pulmonary disease with (acute) exacerbation: Secondary | ICD-10-CM | POA: Diagnosis not present

## 2018-11-07 DIAGNOSIS — Z8673 Personal history of transient ischemic attack (TIA), and cerebral infarction without residual deficits: Secondary | ICD-10-CM | POA: Diagnosis not present

## 2018-11-07 DIAGNOSIS — I5030 Unspecified diastolic (congestive) heart failure: Secondary | ICD-10-CM | POA: Diagnosis not present

## 2018-11-15 ENCOUNTER — Other Ambulatory Visit: Payer: Self-pay | Admitting: "Endocrinology

## 2018-11-24 ENCOUNTER — Encounter: Payer: Self-pay | Admitting: *Deleted

## 2018-12-01 ENCOUNTER — Other Ambulatory Visit: Payer: Self-pay | Admitting: Family Medicine

## 2018-12-07 DIAGNOSIS — J441 Chronic obstructive pulmonary disease with (acute) exacerbation: Secondary | ICD-10-CM | POA: Diagnosis not present

## 2018-12-07 DIAGNOSIS — I5030 Unspecified diastolic (congestive) heart failure: Secondary | ICD-10-CM | POA: Diagnosis not present

## 2018-12-07 DIAGNOSIS — Z8673 Personal history of transient ischemic attack (TIA), and cerebral infarction without residual deficits: Secondary | ICD-10-CM | POA: Diagnosis not present

## 2018-12-07 DIAGNOSIS — G4733 Obstructive sleep apnea (adult) (pediatric): Secondary | ICD-10-CM | POA: Diagnosis not present

## 2018-12-15 ENCOUNTER — Other Ambulatory Visit: Payer: Self-pay | Admitting: "Endocrinology

## 2018-12-22 DIAGNOSIS — I5030 Unspecified diastolic (congestive) heart failure: Secondary | ICD-10-CM | POA: Diagnosis not present

## 2018-12-22 DIAGNOSIS — E119 Type 2 diabetes mellitus without complications: Secondary | ICD-10-CM | POA: Diagnosis not present

## 2018-12-22 DIAGNOSIS — E559 Vitamin D deficiency, unspecified: Secondary | ICD-10-CM | POA: Diagnosis not present

## 2018-12-22 DIAGNOSIS — I10 Essential (primary) hypertension: Secondary | ICD-10-CM | POA: Diagnosis not present

## 2018-12-22 DIAGNOSIS — I25118 Atherosclerotic heart disease of native coronary artery with other forms of angina pectoris: Secondary | ICD-10-CM | POA: Diagnosis not present

## 2018-12-22 DIAGNOSIS — I252 Old myocardial infarction: Secondary | ICD-10-CM | POA: Diagnosis not present

## 2018-12-22 DIAGNOSIS — Z8673 Personal history of transient ischemic attack (TIA), and cerebral infarction without residual deficits: Secondary | ICD-10-CM | POA: Diagnosis not present

## 2018-12-22 DIAGNOSIS — M109 Gout, unspecified: Secondary | ICD-10-CM | POA: Diagnosis not present

## 2018-12-22 DIAGNOSIS — E785 Hyperlipidemia, unspecified: Secondary | ICD-10-CM | POA: Diagnosis not present

## 2018-12-29 ENCOUNTER — Other Ambulatory Visit: Payer: Self-pay | Admitting: Family Medicine

## 2019-01-03 ENCOUNTER — Other Ambulatory Visit: Payer: Self-pay | Admitting: Family Medicine

## 2019-01-07 DIAGNOSIS — J441 Chronic obstructive pulmonary disease with (acute) exacerbation: Secondary | ICD-10-CM | POA: Diagnosis not present

## 2019-01-07 DIAGNOSIS — G4733 Obstructive sleep apnea (adult) (pediatric): Secondary | ICD-10-CM | POA: Diagnosis not present

## 2019-01-07 DIAGNOSIS — Z8673 Personal history of transient ischemic attack (TIA), and cerebral infarction without residual deficits: Secondary | ICD-10-CM | POA: Diagnosis not present

## 2019-01-07 DIAGNOSIS — I5030 Unspecified diastolic (congestive) heart failure: Secondary | ICD-10-CM | POA: Diagnosis not present

## 2019-01-31 ENCOUNTER — Ambulatory Visit (INDEPENDENT_AMBULATORY_CARE_PROVIDER_SITE_OTHER): Payer: Medicare HMO

## 2019-01-31 ENCOUNTER — Other Ambulatory Visit: Payer: Self-pay

## 2019-01-31 DIAGNOSIS — Z23 Encounter for immunization: Secondary | ICD-10-CM | POA: Diagnosis not present

## 2019-02-07 ENCOUNTER — Other Ambulatory Visit: Payer: Self-pay | Admitting: Family Medicine

## 2019-02-07 ENCOUNTER — Ambulatory Visit: Payer: Medicare HMO | Admitting: "Endocrinology

## 2019-02-07 ENCOUNTER — Telehealth: Payer: Self-pay

## 2019-02-07 DIAGNOSIS — Z8673 Personal history of transient ischemic attack (TIA), and cerebral infarction without residual deficits: Secondary | ICD-10-CM | POA: Diagnosis not present

## 2019-02-07 DIAGNOSIS — E038 Other specified hypothyroidism: Secondary | ICD-10-CM

## 2019-02-07 DIAGNOSIS — I5030 Unspecified diastolic (congestive) heart failure: Secondary | ICD-10-CM | POA: Diagnosis not present

## 2019-02-07 DIAGNOSIS — I10 Essential (primary) hypertension: Secondary | ICD-10-CM | POA: Diagnosis not present

## 2019-02-07 DIAGNOSIS — G4733 Obstructive sleep apnea (adult) (pediatric): Secondary | ICD-10-CM | POA: Diagnosis not present

## 2019-02-07 DIAGNOSIS — E1165 Type 2 diabetes mellitus with hyperglycemia: Secondary | ICD-10-CM | POA: Diagnosis not present

## 2019-02-07 DIAGNOSIS — E559 Vitamin D deficiency, unspecified: Secondary | ICD-10-CM | POA: Diagnosis not present

## 2019-02-07 DIAGNOSIS — E782 Mixed hyperlipidemia: Secondary | ICD-10-CM | POA: Diagnosis not present

## 2019-02-07 DIAGNOSIS — J441 Chronic obstructive pulmonary disease with (acute) exacerbation: Secondary | ICD-10-CM | POA: Diagnosis not present

## 2019-02-07 NOTE — Telephone Encounter (Signed)
[name], CMA  

## 2019-02-08 LAB — COMPLETE METABOLIC PANEL WITH GFR
AG Ratio: 1.3 (calc) (ref 1.0–2.5)
ALT: 8 U/L — ABNORMAL LOW (ref 9–46)
AST: 10 U/L (ref 10–35)
Albumin: 3.8 g/dL (ref 3.6–5.1)
Alkaline phosphatase (APISO): 72 U/L (ref 35–144)
BUN: 20 mg/dL (ref 7–25)
CO2: 26 mmol/L (ref 20–32)
Calcium: 9.2 mg/dL (ref 8.6–10.3)
Chloride: 101 mmol/L (ref 98–110)
Creat: 1.01 mg/dL (ref 0.70–1.18)
GFR, Est African American: 83 mL/min/{1.73_m2} (ref >=60)
GFR, Est Non African American: 72 mL/min/{1.73_m2} (ref >=60)
Globulin: 2.9 g/dL (calc) (ref 1.9–3.7)
Glucose, Bld: 228 mg/dL — ABNORMAL HIGH (ref 65–99)
Potassium: 4.4 mmol/L (ref 3.5–5.3)
Sodium: 137 mmol/L (ref 135–146)
Total Bilirubin: 0.3 mg/dL (ref 0.2–1.2)
Total Protein: 6.7 g/dL (ref 6.1–8.1)

## 2019-02-08 LAB — LIPID PANEL
Cholesterol: 96 mg/dL (ref <200)
HDL: 23 mg/dL — ABNORMAL LOW (ref >=40)
LDL Cholesterol (Calc): 45 mg/dL (calc)
Non-HDL Cholesterol (Calc): 73 mg/dL (calc) (ref <130)
Total CHOL/HDL Ratio: 4.2 (calc) (ref <5.0)
Triglycerides: 212 mg/dL — ABNORMAL HIGH (ref <150)

## 2019-02-08 LAB — HEMOGLOBIN A1C
Hgb A1c MFr Bld: 8.5 % of total Hgb — ABNORMAL HIGH (ref <5.7)
Mean Plasma Glucose: 197 (calc)
eAG (mmol/L): 10.9 (calc)

## 2019-02-08 LAB — TSH+FREE T4: TSH W/REFLEX TO FT4: 1.44 mIU/L (ref 0.40–4.50)

## 2019-02-08 LAB — VITAMIN D 25 HYDROXY (VIT D DEFICIENCY, FRACTURES): Vit D, 25-Hydroxy: 46 ng/mL (ref 30–100)

## 2019-02-10 ENCOUNTER — Other Ambulatory Visit: Payer: Self-pay | Admitting: "Endocrinology

## 2019-02-10 ENCOUNTER — Other Ambulatory Visit: Payer: Self-pay | Admitting: Family Medicine

## 2019-02-10 DIAGNOSIS — I2510 Atherosclerotic heart disease of native coronary artery without angina pectoris: Secondary | ICD-10-CM

## 2019-02-16 ENCOUNTER — Ambulatory Visit (INDEPENDENT_AMBULATORY_CARE_PROVIDER_SITE_OTHER): Payer: Medicare HMO | Admitting: "Endocrinology

## 2019-02-16 ENCOUNTER — Other Ambulatory Visit: Payer: Self-pay

## 2019-02-16 ENCOUNTER — Encounter: Payer: Self-pay | Admitting: "Endocrinology

## 2019-02-16 DIAGNOSIS — E038 Other specified hypothyroidism: Secondary | ICD-10-CM | POA: Diagnosis not present

## 2019-02-16 DIAGNOSIS — I10 Essential (primary) hypertension: Secondary | ICD-10-CM | POA: Diagnosis not present

## 2019-02-16 DIAGNOSIS — E1165 Type 2 diabetes mellitus with hyperglycemia: Secondary | ICD-10-CM

## 2019-02-16 DIAGNOSIS — E782 Mixed hyperlipidemia: Secondary | ICD-10-CM

## 2019-02-16 MED ORDER — NOVOLIN 70/30 (70-30) 100 UNIT/ML ~~LOC~~ SUSP: SUBCUTANEOUS | 2 refills | Status: DC

## 2019-02-16 MED ORDER — LEVOTHYROXINE SODIUM 100 MCG PO TABS: 100.0000 ug | ORAL_TABLET | Freq: Every day | ORAL | 1 refills | Status: DC

## 2019-02-16 NOTE — Progress Notes (Signed)
02/16/2019                                                    Endocrinology Telehealth Visit Follow up Note -During COVID -19 Pandemic  This visit type was conducted due to national recommendations for restrictions regarding the COVID-19 Pandemic  in an effort to limit this patient's exposure and mitigate transmission of the corona virus.  Due to his co-morbid illnesses, [name] is at  moderate to high risk for complications without adequate follow up.  This format is felt to be most appropriate for him at this time.  I connected with this patient on 02/16/2019   by telephone and verified that I am speaking with the correct person using two identifiers. [name], Aug 15, 1940. he has verbally consented to this visit. All issues noted in this document were discussed and addressed. The format was not optimal for physical exam.    Subjective:    Patient ID: [name], male    DOB: Apr 30, 1941, PCP Fayrene Helper, MD   Past Medical History:  Diagnosis Date  . Abnormality of gait 05/01/2015  . Arthritis    "left leg; knees" (05/07/2017)  . Arthritis of knee   . CAD (coronary artery disease)   . Chronic bronchitis ([place])    "get it q yr"  . Chronic lower back pain    "since I was a teen" (05/07/2017)  . COPD (chronic obstructive pulmonary disease) ([place])   . CVA (cerebral vascular accident) ([place]) 05/2015   R sided weakness (05/07/2017)  . GERD (gastroesophageal reflux disease)   . Gout   . Hyperlipidemia   . Hypertension   . Myocardial infarction ([place]) 1999  . Obesity   . OSA on CPAP   . Pneumonia    "a few times; last time was ~ 06/2013" (05/07/2017)  . Psoriasis   . Tussive syncope 05/01/2015  . Type II diabetes mellitus ([place])   . Vitamin B12 deficiency 05/01/2015   Past Surgical History:  Procedure Laterality Date  . APPENDECTOMY    . CATARACT EXTRACTION W/PHACO Left 10/07/2016   Procedure: CATARACT EXTRACTION PHACO AND INTRAOCULAR LENS PLACEMENT (IOC);   Surgeon: Rutherford Guys, MD;  Location: [facility];  Service: Ophthalmology;  Laterality: Left;  CDE: 13.49  . CATARACT EXTRACTION W/PHACO Right 10/28/2016   Procedure: CATARACT EXTRACTION PHACO AND INTRAOCULAR LENS PLACEMENT (IOC);  Surgeon: Rutherford Guys, MD;  Location: [facility];  Service: Ophthalmology;  Laterality: Right;  CDE: 16.71  . COLONOSCOPY  03/17/2012   Procedure: COLONOSCOPY;  Surgeon: Rogene Houston, MD;  Location: [facility];  Service: Endoscopy;  Laterality: N/A;  830  . COLONOSCOPY N/A 06/25/2017   Procedure: COLONOSCOPY;  Surgeon: Rogene Houston, MD;  [facility];  Service: Endoscopy;  Laterality: N/A;  [place]- 2009-08/30/2013   "counting today's, I have 5 stents" (08/30/2013)  . CORONARY ANGIOPLASTY WITH STENT PLACEMENT  05/07/2017  . CORONARY STENT INTERVENTION N/A 05/07/2017   Procedure: CORONARY STENT INTERVENTION;  Surgeon: Charolette Forward, MD;  Location: [place];  Service: Cardiovascular;  Laterality: N/A;  . CYSTOSCOPY WITH URETHRAL DILATATION N/A 11/26/2012   Procedure: CYSTOSCOPY WITH URETHRAL DILATATION;  Surgeon: Malka So, MD;  Location: [facility];  Service: Urology;  Laterality: N/A;  .  LEFT HEART CATH AND CORONARY ANGIOGRAPHY N/A 05/07/2017   Procedure: LEFT HEART CATH AND CORONARY ANGIOGRAPHY;  Surgeon: Charolette Forward, MD;  Location: [place] [facility];  Service: Cardiovascular;  Laterality: N/A;  . LEFT HEART CATHETERIZATION WITH CORONARY ANGIOGRAM N/A 08/30/2013   Procedure: LEFT HEART CATHETERIZATION WITH CORONARY ANGIOGRAM;  Surgeon: Clent Demark, MD;  Location: [facility];  Service: Cardiovascular;  Laterality: N/A;  . PERCUTANEOUS CORONARY STENT INTERVENTION (PCI-S) Right 08/30/2013   Procedure: PERCUTANEOUS CORONARY STENT INTERVENTION (PCI-S);  Surgeon: Clent Demark, MD;  Location: [facility] C[facility];  Service: Cardiovascular;  Laterality: Right;   Social History   Socioeconomic History  .  Marital status: Married    Spouse name: Not on file  . Number of children: 2  . Years of education: 40  . Highest education level: 12th grade  Occupational History  . 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 Financial resource strain: Not hard at all  . Food insecurity    Worry: Never true    Inability: Never true  . Transportation needs    Medical: No    Non-medical: No  Tobacco Use  . Smoking status: Former Smoker    Packs/day: 0.50    Years: 33.00    Pack years: 16.50    Types: Cigarettes    Quit date: 11/29/2002    Years since quitting: 16.2  . Smokeless tobacco: Never Used  Substance and Sexual Activity  . Alcohol use: No  . Drug use: No  . Sexual activity: Not Currently  Lifestyle  . Physical activity    Days per week: 0 days    Minutes per session: 0 min  . Stress: Not at all  Relationships  . Social Herbalist on phone: Once a week    Gets together: Never    Attends religious service: Never    Active member of club or organization: No    Attends meetings of clubs or organizations: Never    Relationship status: Married  Other Topics Concern  . Not on file  Social History Narrative   Patient drinks about 3 cups of soda daily.    Patient is left handed.    Lives alone with wife    Outpatient Encounter Medications as of 02/16/2019  Medication Sig  . ACCU-CHEK AVIVA PLUS test strip TEST FOUR TIMES DAILY  . acetaminophen (TYLENOL) 500 MG tablet Take 1,000 mg by mouth every 6 (six) hours as needed for moderate pain (left thigh).   Marland Kitchen albuterol (PROVENTIL HFA;VENTOLIN HFA) 108 (90 Base) MCG/ACT inhaler Inhale 2 puffs into the lungs every 4 (four) hours as needed for wheezing or shortness of breath.  . allopurinol (ZYLOPRIM) 300 MG tablet TAKE 1 TABLET (300 MG TOTAL) BY MOUTH DAILY.  Marland Kitchen aspirin EC 81 MG tablet Take 81 mg by mouth daily.  . Blood Glucose Monitoring Suppl (ACCU-CHEK AVIVA PLUS) w/Device KIT 1 each by Does not  apply route 2 (two) times a day.  . clopidogrel (PLAVIX) 75 MG tablet TAKE 1 TABLET (75 MG TOTAL) BY MOUTH DAILY.  Marland Kitchen ezetimibe (ZETIA) 10 MG tablet Take 1 tablet (10 mg total) by mouth daily.  . ferrous sulfate 325 (65 FE) MG tablet Take 1 tablet (325 mg total) by mouth 3 (three) times daily with meals.  . insulin NPH-regular Human (NOVOLIN 70/30) (70-30) 100 UNIT/ML injection INJECT 35-45 UNITS SUBCUTANEOUSLY WITH BREAKFAST AND SUPPER (WHEN GLUCOSE IS ABOVE 90)  .  levETIRAcetam (KEPPRA) 500 MG tablet TAKE 1 TABLET TWICE DAILY  . levothyroxine (SYNTHROID) 100 MCG tablet Take 1 tablet (100 mcg total) by mouth daily before breakfast.  . losartan (COZAAR) 50 MG tablet Take 50 mg by mouth daily.  . meclizine (ANTIVERT) 25 MG tablet Take 1 tablet (25 mg total) by mouth 3 (three) times daily as needed for dizziness.  . metFORMIN (GLUCOPHAGE) 500 MG tablet TAKE 1 TABLET EVERY DAY WITH BREAKFAST  . metoprolol tartrate (LOPRESSOR) 50 MG tablet TAKE 1/2 TABLET TWICE DAILY  . nitroGLYCERIN (NITROSTAT) 0.4 MG SL tablet Place 1 tablet (0.4 mg total) under the tongue every 5 (five) minutes as needed for chest pain.  . OXYGEN Inhale 2 L into the lungs continuous.  . pantoprazole (PROTONIX) 40 MG tablet TAKE 1 TABLET EVERY DAY BEFORE BREAKFAST  . pravastatin (PRAVACHOL) 40 MG tablet TAKE 2 TABLETS EVERY DAY  . senna (SENOKOT) 8.6 MG tablet Take 1-2 tablets by mouth daily as needed for constipation.   Marland Kitchen spironolactone (ALDACTONE) 25 MG tablet TAKE 1 TABLET EVERY DAY  . tiotropium (SPIRIVA HANDIHALER) 18 MCG inhalation capsule Place 1 capsule (18 mcg total) into inhaler and inhale daily. (Patient taking differently: Place 18 mcg into inhaler and inhale daily as needed (for shortness of breath or wheezing). )  . triamcinolone cream (KENALOG) 0.1 % Apply 1 application topically 2 (two) times daily. Apply twice daily to rash on right elbow  , under a moist wrap for 4 weeks  . vitamin B-12 (CYANOCOBALAMIN) 1000 MCG  tablet Take 1,000 mcg by mouth daily.  . Vitamin D, Ergocalciferol, (DRISDOL) 1.25 MG (50000 UT) CAPS capsule TAKE 1 CAPSULE ONCE WEEKLY  . [DISCONTINUED] insulin NPH-regular Human (NOVOLIN 70/30) (70-30) 100 UNIT/ML injection INJECT 40 UNITS SUBCUTANEOUSLY WITH BREAKFAST AND SUPPER (WHEN GLUCOSE IS ABOVE 90)  . [DISCONTINUED] levothyroxine (SYNTHROID) 100 MCG tablet TAKE 1 TABLET (100 MCG TOTAL) BY MOUTH DAILY BEFORE BREAKFAST.   No facility-administered encounter medications on file as of 02/16/2019.    ALLERGIES: No Known Allergies VACCINATION STATUS: Immunization History  Administered Date(s) Administered  . Fluad Quad(high Dose 65+) 01/31/2019  . Influenza Whole 03/18/2005  . Influenza, High Dose Seasonal PF 02/22/2018  . Influenza,inj,Quad PF,6+ Mos 03/19/2017  . Pneumococcal Conjugate-13 12/08/2013  . Pneumococcal Polysaccharide-23 06/09/2012  . Td 12/04/2003  . Tdap 03/06/2017    Diabetes He presents for his follow-up diabetic visit. He has type 2 diabetes mellitus. Onset time: He was diagnosed at approximate age of 8 years. His disease course has been worsening. There are no hypoglycemic associated symptoms. Pertinent negatives for hypoglycemia include no confusion, headaches, pallor or seizures. Pertinent negatives for diabetes include no chest pain, no fatigue, no polydipsia, no polyphagia, no polyuria and no weakness. Symptoms are worsening. Diabetic complications include a CVA, heart disease, nephropathy and peripheral neuropathy. Risk factors for coronary artery disease include dyslipidemia, diabetes mellitus, male sex, obesity, tobacco exposure and sedentary lifestyle. Current diabetic treatment includes insulin injections. He is compliant with treatment most of the time. His weight is fluctuating minimally. He is following a generally unhealthy diet. When asked about meal planning, he reported none. His home blood glucose trend is increasing steadily. His breakfast blood  glucose range is generally 180-200 mg/dl. His dinner blood glucose range is generally 180-200 mg/dl. His overall blood glucose range is 180-200 mg/dl. An ACE inhibitor/angiotensin II receptor blocker is being taken. Eye exam is current.  Hyperlipidemia This is a chronic problem. The current episode started more than 1  year ago. The problem is controlled. Exacerbating diseases include diabetes, hypothyroidism and obesity. Associated symptoms include shortness of breath. Pertinent negatives include no chest pain or myalgias. Current antihyperlipidemic treatment includes statins. Risk factors for coronary artery disease include diabetes mellitus, dyslipidemia, hypertension, male sex and a sedentary lifestyle.  Hypertension This is a chronic problem. The current episode started more than 1 year ago. The problem is controlled. Associated symptoms include shortness of breath. Pertinent negatives include no chest pain, headaches, neck pain or palpitations. Risk factors for coronary artery disease include dyslipidemia, diabetes mellitus, sedentary lifestyle and male gender. Past treatments include ACE inhibitors. Hypertensive end-organ damage includes CAD/MI and CVA. Identifiable causes of hypertension include a thyroid problem.  Thyroid Problem Presents for follow-up visit. Patient reports no constipation, diarrhea, fatigue or palpitations. The symptoms have been stable. Past treatments include levothyroxine. His past medical history is significant for diabetes and hyperlipidemia.   Review of systems: Limited as above.  Objective:    There were no vitals taken for this visit.  Wt Readings from Last 3 Encounters:  08/05/18 262 lb (118.8 kg)  07/26/18 263 lb (119.3 kg)  07/12/18 261 lb (118.4 kg)     CMP     Component Value Date/Time   NA 137 02/07/2019 0935   K 4.4 02/07/2019 0935   CL 101 02/07/2019 0935   CO2 26 02/07/2019 0935   GLUCOSE 228 (H) 02/07/2019 0935   BUN 20 02/07/2019 0935    CREATININE 1.01 02/07/2019 0935   CALCIUM 9.2 02/07/2019 0935   PROT 6.7 02/07/2019 0935   ALBUMIN 3.8 04/20/2017 1130   AST 10 02/07/2019 0935   ALT 8 (L) 02/07/2019 0935   ALKPHOS 70 04/20/2017 1130   BILITOT 0.3 02/07/2019 0935   GFRNONAA 72 02/07/2019 0935   GFRAA 83 02/07/2019 0935     Diabetic Labs (most recent): Lab Results  Component Value Date   HGBA1C 8.5 (H) 02/07/2019   HGBA1C 7.3 (H) 07/13/2018   HGBA1C 8.3 (H) 02/15/2018     Lipid Panel ( most recent) Lipid Panel     Component Value Date/Time   CHOL 96 02/07/2019 0935   TRIG 212 (H) 02/07/2019 0935   HDL 23 (L) 02/07/2019 0935   CHOLHDL 4.2 02/07/2019 0935   VLDL 35 04/20/2017 1130   LDLCALC 45 02/07/2019 0935   LDLDIRECT 50 08/18/2014 0737      Assessment & Plan:   1. Type 2 diabetes mellitus with multiple competitions including diabetic nephropathy -His diabetes is  complicated by coronary artery disease, CVA, CKD and patient remains at a high risk for more acute and chronic complications of diabetes which include CAD, CVA, CKD, retinopathy, and neuropathy. These are all discussed in detail with the patient.  -Mr. [name] reports above target glycemic profile prebreakfast between 1 57-250, presupper between 157-267.   His previsit labs show A1c of 8.5% increasing from 7.3% . -He did not document no report major hypoglycemia.       -  Recent labs reviewed.  - I have re-counseled the patient on diet management and weight loss  by adopting a carbohydrate restricted / protein rich  Diet.  - he  admits there is a room for improvement in his diet and drink choices. -  Suggestion is made for him to avoid simple carbohydrates  from his diet including Cakes, Sweet Desserts / Pastries, Ice Cream, Soda (diet and regular), Sweet Tea, Candies, Chips, Cookies, Sweet Pastries,  Store Bought Juices, Alcohol in Excess of  1-2 drinks a day, Artificial Sweeteners, Coffee Creamer, and "Sugar-free" Products. This will  help patient to have stable blood glucose profile and potentially avoid unintended weight gain.   - Patient is advised to stick to a routine mealtimes to eat 3 meals  a day and avoid unnecessary snacks ( to snack only to correct hypoglycemia).  - I have approached patient with the following individualized plan to manage diabetes and patient agrees.  - He has done reasonably very well on premixed Novolin 70/30. He wishes to stay on the same regimen for cost reasons.  -He is advised to increase his Novolin 70/30 to 45 units at breakfast and 35 units with supper  for pre-meal glucose of greater than 90 mg/dL. He is advised to continue strict  monitoring of glucose  4 times a day-before meals and at bedtime.   -He is warned  not to take insulin without proper monitoring per orders.  -Patient is encouraged to call clinic for blood glucose levels less than 70 or above 300 mg /dl. -He he is advised to continue low-dose metformin, 500 mg p.o. daily after breakfast.     - Patient specific target  for A1c; LDL, HDL, Triglycerides, and  Waist Circumference were discussed in detail.  2) BP/HTN: he is advised to home monitor blood pressure and report if > 140/90 on 2 separate readings.    He is advised to continue his current blood pressure medications including losartan 50 mg p.o. daily.    3) Lipids/HPL: His recent lipid panel showed controlled LDL at 55.  He is advised to continue pravastatin 40 mg p.o. nightly.    4)  Weight/Diet: CDE consult in progress, exercise, and carbohydrates information provided.  5) hypothyroidism:  -His levothyroxine was recently increased to 100 mcg p.o. daily before breakfast.    - We discussed about the correct intake of his thyroid hormone, on empty stomach at fasting, with water, separated by at least 30 minutes from breakfast and other medications,  and separated by more than 4 hours from calcium, iron, multivitamins, acid reflux medications (PPIs). -Patient is  made aware of the fact that thyroid hormone replacement is needed for life, dose to be adjusted by periodic monitoring of thyroid function tests.    6) Chronic Care/Health Maintenance:  -Patient is on ACEI/ARB and Statin medications and encouraged to continue to follow up with Ophthalmology, nephrology,  Podiatrist at least yearly or according to recommendations, and advised to  stay away from smoking. I have recommended yearly flu vaccine and pneumonia vaccination at least every 5 years;  and  sleep for at least 7 hours a day.  - I advised him to pick up compression stockings to wear on bilateral lower extremities during the times, to help with dependent edema on bilateral lower extremities.   - I advised patient to maintain close follow up with Fayrene Helper, MD for primary care needs.  - Patient Care Time Today:  25 min, of which >50% was spent in  counseling and the rest reviewing his  current and  previous labs/studies, previous treatments, his blood glucose readings, and medications' doses and developing a plan for long-term care based on the latest recommendations for standards of care.   [name] participated in the discussions, expressed understanding, and voiced agreement with the above plans.  All questions were answered to his satisfaction. he is encouraged to contact clinic should he have any questions or concerns prior to his return visit.   Follow  up plan: -Return in about 4 months (around 06/18/2019) for Bring Meter and Logs- A1c in Office, Include 8 log sheets.  Glade Lloyd, MD Phone: [phone]  Fax: [phone]   -  This note was partially dictated with voice recognition software. Similar sounding words can be transcribed inadequately or may not  be corrected upon review.  02/16/2019, 11:33 AM

## 2019-02-28 ENCOUNTER — Other Ambulatory Visit: Payer: Self-pay | Admitting: "Endocrinology

## 2019-02-28 ENCOUNTER — Other Ambulatory Visit: Payer: Self-pay | Admitting: Family Medicine

## 2019-03-01 ENCOUNTER — Ambulatory Visit (INDEPENDENT_AMBULATORY_CARE_PROVIDER_SITE_OTHER): Payer: Medicare HMO | Admitting: Family Medicine

## 2019-03-01 ENCOUNTER — Other Ambulatory Visit: Payer: Self-pay

## 2019-03-01 ENCOUNTER — Encounter: Payer: Self-pay | Admitting: Family Medicine

## 2019-03-01 VITALS — BP 138/70 | HR 74 | Temp 97.7°F | Resp 18 | Ht 70.0 in | Wt 256.0 lb

## 2019-03-01 DIAGNOSIS — Z794 Long term (current) use of insulin: Secondary | ICD-10-CM | POA: Diagnosis not present

## 2019-03-01 DIAGNOSIS — R0902 Hypoxemia: Secondary | ICD-10-CM

## 2019-03-01 DIAGNOSIS — Z9181 History of falling: Secondary | ICD-10-CM

## 2019-03-01 DIAGNOSIS — Z0000 Encounter for general adult medical examination without abnormal findings: Secondary | ICD-10-CM | POA: Diagnosis not present

## 2019-03-01 DIAGNOSIS — G8191 Hemiplegia, unspecified affecting right dominant side: Secondary | ICD-10-CM | POA: Diagnosis not present

## 2019-03-01 DIAGNOSIS — E1165 Type 2 diabetes mellitus with hyperglycemia: Secondary | ICD-10-CM

## 2019-03-01 DIAGNOSIS — J449 Chronic obstructive pulmonary disease, unspecified: Secondary | ICD-10-CM | POA: Diagnosis not present

## 2019-03-01 MED ORDER — UNABLE TO FIND: 0 refills | Status: DC

## 2019-03-01 NOTE — Progress Notes (Signed)
   [name]     MRN: [id number]      DOB: 04/07/1941   HPI: Patient is in for annual physical exam. Reports falling off of a chair which slid under him in the past 2 weeks, landed on buttock, no significant injury.Requests standing walker with bench States he needs supplemental oxygen, has had none in months, h is oxygen dependent 24/7 so will follow up on this Immunization is reviewed , and  updated if needed.    PE;BP 138/70   Pulse 74   Temp 97.7 F (36.5 C) (Temporal)   Resp 18   Ht 5\' 10"  (1.778 m)   Wt 256 lb (116.1 kg)   SpO2 (!) 74%   BMI 36.73 kg/m     Pleasant male, alert and oriented x 3, in no cardio-pulmonary distress. Afebrile. HEENT  facial asymetry. Sinuses non tender. EOMI, . External ears normal, . Neck:decreased  ROM, no adenopathy,JVD or thyromegaly.No bruits.  Chest: Clear to ascultation bilaterally.No crackles or wheezes.markedly decreased air entry bilaterally Non tender to palpation    Cardiovascular system; Heart sounds normal,  S1 and  S2 ,no S3.  No murmur, or thrill. Apical beat not displaced Peripheral pulses normal.  Abdomen: Soft, non tender, . No guarding, tenderness or rebound.      Musculoskeletal exam: Decreased   ROM of spine, hips , shoulders and knees.  deformity ,swelling and  crepitus noted.  muscle wasting and  atrophy.   Neurologic: Cranial nerves 2 to 12 intact. Grade 3 power in RUE and RLE Abnormal  Gait, relies on assistive device for safe mobility.  disturbance in gait. No tremor.  Skin: Intact, no ulceration, , scaling  rash noted.on shins Pigmentation normal throughout  Psych; Normal mood and affect. Judgement and concentration normal   Assessment & Plan:   At moderate risk for fall Increased fall risk due to orthopedic and neurologic disease with recent fall. Also poor exercise tolerance due to severe lung and hear disease, he is oxygen  Dependent , will benefit from and needs a walker  with wheels, standing requested for safety and more independence  Right hemiparesis (HCC) Chronic right hemiparesis due to CVA, limiting safe independent ambulation, needs mobility device, standing walker with bench requested  COPD (chronic obstructive pulmonary disease) (HCC) Severe COPD and heart failure, hypoxic with activity and at rest, recovers with the use of supplemental oxygen, needs portable oxygen, as hje requires oxygen 24/7  Annual physical exam Annual exam as documented. . Regular seat belt use and home safety, is also discussed.   Hypoxia Pt hypoxic with activity, and at rest,  recovers with the use of supplemental oxygen, needs supplemental oxygen 24/7

## 2019-03-01 NOTE — Patient Instructions (Addendum)
F/U in 4  [place] Progression Recent Vital Signs   BP 138/70   Pulse 74   Temp 97.7 F (36.5 C) (Temporal)   Resp 18   Ht 5\' 10"  (1.778 m)   Wt 256 lb (116.1 kg)   SpO2 (!) 74%   BMI 36.73 kg/m    Past Medical History:  Diagnosis Date  . Abnormality of gait 05/01/2015  . Arthritis    "left leg; knees" (05/07/2017)  . Arthritis of knee   . CAD (coronary artery disease)   . Chronic bronchitis ([place])    "get it q yr"  . Chronic lower back pain    "since I was a teen" (05/07/2017)  . COPD (chronic obstructive pulmonary disease) ([place])   . CVA (cerebral vascular accident) ([place]) 05/2015   R sided weakness (05/07/2017)  . GERD (gastroesophageal reflux disease)   . Gout   . Hyperlipidemia   . Hypertension   . Myocardial infarction ([place]) 1999  . Obesity   . OSA on CPAP   . Pneumonia    "a few times; last time was ~ 06/2013" (05/07/2017)  . Psoriasis   . Tussive syncope 05/01/2015  . Type II diabetes mellitus ([place])   . Vitamin B12 deficiency 05/01/2015     Expected Discharge Date     Diet Order    None       F/U in 4 months, call if you need me before.  You are referred for diabetic eye exam, either in [place], or [place]. ( please try and give appointment before he leaves)  We will work on oxygen supply for you.  We will work on standing walker with a benc h for you  Careful no more falls

## 2019-03-02 ENCOUNTER — Encounter: Payer: Self-pay | Admitting: Family Medicine

## 2019-03-02 ENCOUNTER — Other Ambulatory Visit: Payer: Self-pay | Admitting: "Endocrinology

## 2019-03-02 ENCOUNTER — Encounter: Payer: Medicare HMO | Admitting: Family Medicine

## 2019-03-02 DIAGNOSIS — Z9181 History of falling: Secondary | ICD-10-CM | POA: Insufficient documentation

## 2019-03-02 NOTE — Assessment & Plan Note (Signed)
Severe COPD and heart failure, hypoxic with activity and at rest, recovers with the use of supplemental oxygen, needs portable oxygen, as hje requires oxygen 24/7

## 2019-03-02 NOTE — Assessment & Plan Note (Signed)
Chronic right hemiparesis due to CVA, limiting safe independent ambulation, needs mobility device, standing walker with bench requested

## 2019-03-02 NOTE — Assessment & Plan Note (Signed)
Annual exam as documented.  Regular seat belt use and home safety, is also discussed.  

## 2019-03-02 NOTE — Assessment & Plan Note (Signed)
Pt hypoxic with activity, and at rest,  recovers with the use of supplemental oxygen, needs supplemental oxygen 24/7

## 2019-03-02 NOTE — Assessment & Plan Note (Signed)
Increased fall risk due to orthopedic and neurologic disease with recent fall. Also poor exercise tolerance due to severe lung and hear disease, he is oxygen  Dependent , will benefit from and needs a walker with wheels, standing requested for safety and more independence

## 2019-03-08 MED ORDER — UNABLE TO FIND: 0 refills | Status: DC

## 2019-03-08 NOTE — Addendum Note (Signed)
Addended by: Eual Fines on: 03/08/2019 09:19 AM   Modules accepted: Orders

## 2019-03-09 DIAGNOSIS — G4733 Obstructive sleep apnea (adult) (pediatric): Secondary | ICD-10-CM | POA: Diagnosis not present

## 2019-03-09 DIAGNOSIS — J441 Chronic obstructive pulmonary disease with (acute) exacerbation: Secondary | ICD-10-CM | POA: Diagnosis not present

## 2019-03-09 DIAGNOSIS — I5030 Unspecified diastolic (congestive) heart failure: Secondary | ICD-10-CM | POA: Diagnosis not present

## 2019-03-09 DIAGNOSIS — Z8673 Personal history of transient ischemic attack (TIA), and cerebral infarction without residual deficits: Secondary | ICD-10-CM | POA: Diagnosis not present

## 2019-03-10 ENCOUNTER — Encounter: Payer: Medicare HMO | Admitting: Family Medicine

## 2019-03-23 DIAGNOSIS — E119 Type 2 diabetes mellitus without complications: Secondary | ICD-10-CM | POA: Diagnosis not present

## 2019-03-23 DIAGNOSIS — M109 Gout, unspecified: Secondary | ICD-10-CM | POA: Diagnosis not present

## 2019-03-23 DIAGNOSIS — E785 Hyperlipidemia, unspecified: Secondary | ICD-10-CM | POA: Diagnosis not present

## 2019-03-23 DIAGNOSIS — I10 Essential (primary) hypertension: Secondary | ICD-10-CM | POA: Diagnosis not present

## 2019-03-23 DIAGNOSIS — I5030 Unspecified diastolic (congestive) heart failure: Secondary | ICD-10-CM | POA: Diagnosis not present

## 2019-03-23 DIAGNOSIS — I639 Cerebral infarction, unspecified: Secondary | ICD-10-CM | POA: Diagnosis not present

## 2019-03-23 DIAGNOSIS — I25118 Atherosclerotic heart disease of native coronary artery with other forms of angina pectoris: Secondary | ICD-10-CM | POA: Diagnosis not present

## 2019-04-09 DIAGNOSIS — G4733 Obstructive sleep apnea (adult) (pediatric): Secondary | ICD-10-CM | POA: Diagnosis not present

## 2019-04-09 DIAGNOSIS — J441 Chronic obstructive pulmonary disease with (acute) exacerbation: Secondary | ICD-10-CM | POA: Diagnosis not present

## 2019-04-09 DIAGNOSIS — I5030 Unspecified diastolic (congestive) heart failure: Secondary | ICD-10-CM | POA: Diagnosis not present

## 2019-04-09 DIAGNOSIS — Z8673 Personal history of transient ischemic attack (TIA), and cerebral infarction without residual deficits: Secondary | ICD-10-CM | POA: Diagnosis not present

## 2019-04-11 ENCOUNTER — Other Ambulatory Visit: Payer: Self-pay | Admitting: "Endocrinology

## 2019-04-11 ENCOUNTER — Other Ambulatory Visit: Payer: Self-pay | Admitting: Family Medicine

## 2019-04-11 DIAGNOSIS — I2510 Atherosclerotic heart disease of native coronary artery without angina pectoris: Secondary | ICD-10-CM

## 2019-04-22 ENCOUNTER — Other Ambulatory Visit: Payer: Self-pay | Admitting: Family Medicine

## 2019-04-22 ENCOUNTER — Other Ambulatory Visit: Payer: Self-pay

## 2019-04-22 MED ORDER — ALLOPURINOL 300 MG PO TABS: 300.0000 mg | ORAL_TABLET | Freq: Every day | ORAL | 1 refills | Status: DC

## 2019-05-09 DIAGNOSIS — I5030 Unspecified diastolic (congestive) heart failure: Secondary | ICD-10-CM | POA: Diagnosis not present

## 2019-05-09 DIAGNOSIS — J441 Chronic obstructive pulmonary disease with (acute) exacerbation: Secondary | ICD-10-CM | POA: Diagnosis not present

## 2019-05-09 DIAGNOSIS — G4733 Obstructive sleep apnea (adult) (pediatric): Secondary | ICD-10-CM | POA: Diagnosis not present

## 2019-05-09 DIAGNOSIS — Z8673 Personal history of transient ischemic attack (TIA), and cerebral infarction without residual deficits: Secondary | ICD-10-CM | POA: Diagnosis not present

## 2019-05-26 ENCOUNTER — Other Ambulatory Visit: Payer: Self-pay | Admitting: Family Medicine

## 2019-06-09 DIAGNOSIS — I5030 Unspecified diastolic (congestive) heart failure: Secondary | ICD-10-CM | POA: Diagnosis not present

## 2019-06-09 DIAGNOSIS — Z8673 Personal history of transient ischemic attack (TIA), and cerebral infarction without residual deficits: Secondary | ICD-10-CM | POA: Diagnosis not present

## 2019-06-09 DIAGNOSIS — J441 Chronic obstructive pulmonary disease with (acute) exacerbation: Secondary | ICD-10-CM | POA: Diagnosis not present

## 2019-06-09 DIAGNOSIS — G4733 Obstructive sleep apnea (adult) (pediatric): Secondary | ICD-10-CM | POA: Diagnosis not present

## 2019-06-17 ENCOUNTER — Other Ambulatory Visit: Payer: Self-pay | Admitting: "Endocrinology

## 2019-06-20 ENCOUNTER — Encounter: Payer: Self-pay | Admitting: "Endocrinology

## 2019-06-20 ENCOUNTER — Ambulatory Visit (INDEPENDENT_AMBULATORY_CARE_PROVIDER_SITE_OTHER): Payer: Medicare HMO | Admitting: "Endocrinology

## 2019-06-20 VITALS — BP 171/86 | HR 71 | Ht 70.0 in | Wt 262.6 lb

## 2019-06-20 DIAGNOSIS — I10 Essential (primary) hypertension: Secondary | ICD-10-CM | POA: Diagnosis not present

## 2019-06-20 DIAGNOSIS — E038 Other specified hypothyroidism: Secondary | ICD-10-CM | POA: Diagnosis not present

## 2019-06-20 DIAGNOSIS — E1165 Type 2 diabetes mellitus with hyperglycemia: Secondary | ICD-10-CM

## 2019-06-20 DIAGNOSIS — E782 Mixed hyperlipidemia: Secondary | ICD-10-CM

## 2019-06-20 LAB — POCT GLYCOSYLATED HEMOGLOBIN (HGB A1C): Hemoglobin A1C: 7.9 % — AB (ref 4.0–5.6)

## 2019-06-20 NOTE — Progress Notes (Signed)
06/20/2019  Endocrinology follow-up note  Subjective:    Patient ID: [name], male    DOB: 1941-02-21, PCP Fayrene Helper, MD   Past Medical History:  Diagnosis Date  . Abnormality of gait 05/01/2015  . Arthritis    "left leg; knees" (05/07/2017)  . Arthritis of knee   . CAD (coronary artery disease)   . Chronic bronchitis ([place])    "get it q yr"  . Chronic lower back pain    "since I was a teen" (05/07/2017)  . COPD (chronic obstructive pulmonary disease) ([place])   . CVA (cerebral vascular accident) ([place]) 05/2015   R sided weakness (05/07/2017)  . GERD (gastroesophageal reflux disease)   . Gout   . Hyperlipidemia   . Hypertension   . Myocardial infarction ([place]) 1999  . Obesity   . OSA on CPAP   . Pneumonia    "a few times; last time was ~ 06/2013" (05/07/2017)  . Psoriasis   . Tussive syncope 05/01/2015  . Type II diabetes mellitus ([place])   . Vitamin B12 deficiency 05/01/2015   Past Surgical History:  Procedure Laterality Date  . APPENDECTOMY    . CATARACT EXTRACTION W/PHACO Left 10/07/2016   Procedure: CATARACT EXTRACTION PHACO AND INTRAOCULAR LENS PLACEMENT (IOC);  Surgeon: Rutherford Guys, MD;  Location: [facility];  Service: Ophthalmology;  Laterality: Left;  CDE: 13.49  . CATARACT EXTRACTION W/PHACO Right 10/28/2016   Procedure: CATARACT EXTRACTION PHACO AND INTRAOCULAR LENS PLACEMENT (IOC);  Surgeon: Rutherford Guys, MD;  Location: [facility];  Service: Ophthalmology;  Laterality: Right;  CDE: 16.71  . COLONOSCOPY  03/17/2012   Procedure: COLONOSCOPY;  Surgeon: Rogene Houston, MD;  Location: [facility];  Service: Endoscopy;  Laterality: N/A;  830  . COLONOSCOPY N/A 06/25/2017   Procedure: COLONOSCOPY;  Surgeon: Rogene Houston, MD;  [facility];  Service: Endoscopy;  Laterality: N/A;  [place]- 2009-08/30/2013   "counting today's, I have 5 stents" (08/30/2013)  . CORONARY ANGIOPLASTY WITH STENT PLACEMENT   05/07/2017  . CORONARY STENT INTERVENTION N/A 05/07/2017   Procedure: CORONARY STENT INTERVENTION;  Surgeon: Charolette Forward, MD;  Location: [facility];  Service: Cardiovascular;  Laterality: N/A;  . CYSTOSCOPY WITH URETHRAL DILATATION N/A 11/26/2012   Procedure: CYSTOSCOPY WITH URETHRAL DILATATION;  Surgeon: Malka So, MD;  Location: [facility];  Service: Urology;  Laterality: N/A;  . LEFT HEART CATH AND CORONARY ANGIOGRAPHY N/A 05/07/2017   Procedure: LEFT HEART CATH AND CORONARY ANGIOGRAPHY;  Surgeon: Charolette Forward, MD;  Location: [facility];  Service: Cardiovascular;  Laterality: N/A;  . LEFT HEART CATHETERIZATION WITH CORONARY ANGIOGRAM N/A 08/30/2013   Procedure: LEFT HEART CATHETERIZATION WITH CORONARY ANGIOGRAM;  Surgeon: Clent Demark, MD;  Location: [facility];  Service: Cardiovascular;  Laterality: N/A;  . PERCUTANEOUS CORONARY STENT INTERVENTION (PCI-S) Right 08/30/2013   Procedure: PERCUTANEOUS CORONARY STENT INTERVENTION (PCI-S);  Surgeon: Clent Demark, MD;  Location: [facility] C[facility] LAB;  Service: Cardiovascular;  Laterality: Right;   Social History   Socioeconomic History  . Marital status: Married    Spouse name: Not on file  . Number of children: 2  . Years of education: 43  . Highest education level: 12th grade  Occupational History  . Occupation: Brewing technologist     Comment: retired   Tobacco Use  . Smoking status: Former Smoker    Packs/day: 0.50    Years: 33.00  Pack years: 16.50    Types: Cigarettes    Quit date: 11/29/2002    Years since quitting: 16.5  . Smokeless tobacco: Never Used  Substance and Sexual Activity  . Alcohol use: No  . Drug use: No  . Sexual activity: Not Currently  Other Topics Concern  . Not on file  Social History Narrative   Patient drinks about 3 cups of soda daily.    Patient is left handed.    Lives alone with wife    Social Determinants of Health   Financial Resource Strain:   . Difficulty of Paying Living  Expenses: Not on file  Food Insecurity:   . Worried About Charity fundraiser in the Last Year: Not on file  . Ran Out of Food in the Last Year: Not on file  Transportation Needs:   . Lack of Transportation (Medical): Not on file  . Lack of Transportation (Non-Medical): Not on file  Physical Activity:   . Days of Exercise per Week: Not on file  . Minutes of Exercise per Session: Not on file  Stress:   . Feeling of Stress : Not on file  Social Connections:   . Frequency of Communication with Friends and Family: Not on file  . Frequency of Social Gatherings with Friends and Family: Not on file  . Attends Religious Services: Not on file  . Active Member of Clubs or Organizations: Not on file  . Attends Archivist Meetings: Not on file  . Marital Status: Not on file   Outpatient Encounter Medications as of 06/20/2019  Medication Sig  . ACCU-CHEK AVIVA PLUS test strip TEST FOUR TIMES DAILY  . acetaminophen (TYLENOL) 500 MG tablet Take 1,000 mg by mouth every 6 (six) hours as needed for moderate pain (left thigh).   Marland Kitchen allopurinol (ZYLOPRIM) 300 MG tablet Take 1 tablet (300 mg total) by mouth daily.  Marland Kitchen aspirin EC 81 MG tablet Take 81 mg by mouth daily.  . Blood Glucose Monitoring Suppl (ACCU-CHEK AVIVA PLUS) w/Device KIT 1 each by Does not apply route 2 (two) times a day.  . clopidogrel (PLAVIX) 75 MG tablet TAKE 1 TABLET (75 MG TOTAL) BY MOUTH DAILY.  Marland Kitchen ezetimibe (ZETIA) 10 MG tablet Take 1 tablet (10 mg total) by mouth daily.  . ferrous sulfate 325 (65 FE) MG tablet Take 1 tablet (325 mg total) by mouth 3 (three) times daily with meals.  . insulin NPH-regular Human (NOVOLIN 70/30) (70-30) 100 UNIT/ML injection INJECT 40 UNITS SUBCUTANEOUSLY WITH BREAKFAST AND SUPPER (WHEN GLUCOSE IS ABOVE 90)  . levETIRAcetam (KEPPRA) 500 MG tablet TAKE 1 TABLET TWICE DAILY  . levothyroxine (SYNTHROID) 100 MCG tablet TAKE 1 TABLET (100 MCG TOTAL) BY MOUTH DAILY BEFORE BREAKFAST.  Marland Kitchen losartan  (COZAAR) 50 MG tablet Take 50 mg by mouth daily.  . meclizine (ANTIVERT) 25 MG tablet Take 1 tablet (25 mg total) by mouth 3 (three) times daily as needed for dizziness.  . metFORMIN (GLUCOPHAGE) 500 MG tablet TAKE 1 TABLET EVERY DAY WITH BREAKFAST  . metoprolol tartrate (LOPRESSOR) 50 MG tablet TAKE 1/2 TABLET TWICE DAILY  . nitroGLYCERIN (NITROSTAT) 0.4 MG SL tablet Place 1 tablet (0.4 mg total) under the tongue every 5 (five) minutes as needed for chest pain.  . OXYGEN Inhale 2 L into the lungs continuous.  . pantoprazole (PROTONIX) 40 MG tablet TAKE 1 TABLET EVERY DAY BEFORE BREAKFAST  . pravastatin (PRAVACHOL) 40 MG tablet TAKE 2 TABLETS EVERY DAY  . senna (  SENOKOT) 8.6 MG tablet Take 1-2 tablets by mouth daily as needed for constipation.   Marland Kitchen spironolactone (ALDACTONE) 25 MG tablet TAKE 1 TABLET EVERY DAY  . tiotropium (SPIRIVA HANDIHALER) 18 MCG inhalation capsule Place 1 capsule (18 mcg total) into inhaler and inhale daily. (Patient taking differently: Place 18 mcg into inhaler and inhale daily as needed (for shortness of breath or wheezing). )  . triamcinolone cream (KENALOG) 0.1 % Apply 1 application topically 2 (two) times daily. Apply twice daily to rash on right elbow  , under a moist wrap for 4 weeks  . UNABLE TO FIND OCD evaluation  DX COPD  . UNABLE TO FIND Standing walker with bench Dx g81.91,  . vitamin B-12 (CYANOCOBALAMIN) 1000 MCG tablet Take 1,000 mcg by mouth daily.  . Vitamin D, Ergocalciferol, (DRISDOL) 1.25 MG (50000 UT) CAPS capsule TAKE 1 CAPSULE ONCE WEEKLY   No facility-administered encounter medications on file as of 06/20/2019.   ALLERGIES: No Known Allergies VACCINATION STATUS: Immunization History  Administered Date(s) Administered  . Fluad Quad(high Dose 65+) 01/31/2019  . Influenza Whole 03/18/2005  . Influenza, High Dose Seasonal PF 02/22/2018  . Influenza,inj,Quad PF,6+ Mos 03/19/2017  . Pneumococcal Conjugate-13 12/08/2013  . Pneumococcal  Polysaccharide-23 06/09/2012  . Td 12/04/2003  . Tdap 03/06/2017    Diabetes He presents for his follow-up diabetic visit. He has type 2 diabetes mellitus. Onset time: He was diagnosed at approximate age of 24 years. His disease course has been improving. There are no hypoglycemic associated symptoms. Pertinent negatives for hypoglycemia include no confusion, headaches, pallor or seizures. Pertinent negatives for diabetes include no chest pain, no fatigue, no polydipsia, no polyphagia, no polyuria and no weakness. Symptoms are improving. Diabetic complications include a CVA, heart disease, nephropathy and peripheral neuropathy. Risk factors for coronary artery disease include dyslipidemia, diabetes mellitus, male sex, obesity, tobacco exposure and sedentary lifestyle. Current diabetic treatment includes insulin injections. He is compliant with treatment most of the time. His weight is fluctuating minimally. He is following a generally unhealthy diet. When asked about meal planning, he reported none. His home blood glucose trend is increasing steadily. His breakfast blood glucose range is generally 140-180 mg/dl. His dinner blood glucose range is generally 140-180 mg/dl. His overall blood glucose range is 140-180 mg/dl. (He returns with controlled glycemic profile both before breakfast and before supper.  He has no hypoglycemic episodes.  His point-of-care A1c is 7.9% improving from 8.5%.) An ACE inhibitor/angiotensin II receptor blocker is being taken. Eye exam is current.  Hyperlipidemia This is a chronic problem. The current episode started more than 1 year ago. The problem is controlled. Exacerbating diseases include diabetes, hypothyroidism and obesity. Associated symptoms include shortness of breath. Pertinent negatives include no chest pain or myalgias. Current antihyperlipidemic treatment includes statins. Risk factors for coronary artery disease include diabetes mellitus, dyslipidemia, hypertension,  male sex and a sedentary lifestyle.  Hypertension This is a chronic problem. The current episode started more than 1 year ago. The problem is controlled. Associated symptoms include shortness of breath. Pertinent negatives include no chest pain, headaches, neck pain or palpitations. Risk factors for coronary artery disease include dyslipidemia, diabetes mellitus, sedentary lifestyle and male gender. Past treatments include ACE inhibitors. Hypertensive end-organ damage includes CAD/MI and CVA. Identifiable causes of hypertension include a thyroid problem.  Thyroid Problem Presents for follow-up visit. Patient reports no constipation, diarrhea, fatigue or palpitations. The symptoms have been stable. Past treatments include levothyroxine. His past medical history is significant for diabetes  and hyperlipidemia.   Review of systems:    Review of systems  Constitutional: + Minimally fluctuating body weight,  current  Body mass index is 37.68 kg/m. , no fatigue, no subjective hyperthermia, no subjective hypothermia Eyes: no blurry vision, no xerophthalmia ENT: no sore throat, no nodules palpated in throat, no dysphagia/odynophagia, no hoarseness Cardiovascular: no Chest Pain, no Shortness of Breath, no palpitations, no leg swelling Respiratory: no cough, no shortness of breath Gastrointestinal: no Nausea/Vomiting/Diarhhea Musculoskeletal: + Disequilibrium, ambulates with a specialized walker.   Skin: no rashes Neurological: no tremors, no numbness, no tingling, no dizziness Psychiatric: no depression, no anxiety   Objective:    BP (!) 171/86   Pulse 71   Ht _0  (1.778 m)   Wt 262 lb 9.6 oz (119.1 kg)   BMI 37.68 kg/m   Wt Readings from Last 3 Encounters:  06/20/19 262 lb 9.6 oz (119.1 kg)  03/01/19 256 lb (116.1 kg)  08/05/18 262 lb (118.8 kg)     Physical Exam- Limited  Constitutional:  Body mass index is 37.68 kg/m. , not in acute distress, normal state of mind Eyes:  EOMI, no  exophthalmos Neck: Supple Respiratory: Adequate breathing efforts Musculoskeletal: no gross deformities, strength intact in all four extremities, no gross restriction of joint movements Skin:  no rashes, no hyperemia Neurological: no tremor with outstretched hands.  CMP     Component Value Date/Time   NA 137 02/07/2019 0935   K 4.4 02/07/2019 0935   CL 101 02/07/2019 0935   CO2 26 02/07/2019 0935   GLUCOSE 228 (H) 02/07/2019 0935   BUN 20 02/07/2019 0935   CREATININE 1.01 02/07/2019 0935   CALCIUM 9.2 02/07/2019 0935   PROT 6.7 02/07/2019 0935   ALBUMIN 3.8 04/20/2017 1130   AST 10 02/07/2019 0935   ALT 8 (L) 02/07/2019 0935   ALKPHOS 70 04/20/2017 1130   BILITOT 0.3 02/07/2019 0935   GFRNONAA 72 02/07/2019 0935   GFRAA 83 02/07/2019 0935     Diabetic Labs (most recent): Lab Results  Component Value Date   HGBA1C 7.9 (A) 06/20/2019   HGBA1C 8.5 (H) 02/07/2019   HGBA1C 7.3 (H) 07/13/2018     Lipid Panel ( most recent) Lipid Panel     Component Value Date/Time   CHOL 96 02/07/2019 0935   TRIG 212 (H) 02/07/2019 0935   HDL 23 (L) 02/07/2019 0935   CHOLHDL 4.2 02/07/2019 0935   VLDL 35 04/20/2017 1130   LDLCALC 45 02/07/2019 0935   LDLDIRECT 50 08/18/2014 0737      Assessment & Plan:   1. Type 2 diabetes mellitus with multiple competitions including diabetic nephropathy -His diabetes is  complicated by coronary artery disease, CVA, CKD and patient remains at a high risk for more acute and chronic complications of diabetes which include CAD, CVA, CKD, retinopathy, and neuropathy. These are all discussed in detail with the patient. He returns with controlled glycemic profile both before breakfast and before supper.  He has no hypoglycemic episodes.  His point-of-care A1c is 7.9% improving from 8.5%.    -  Recent labs reviewed.  - I have re-counseled the patient on diet management and weight loss  by adopting a carbohydrate restricted / protein rich  Diet.  - he   admits there is a room for improvement in his diet and drink choices. -  Suggestion is made for him to avoid simple carbohydrates  from his diet including Cakes, Sweet Desserts / Pastries, Ice Cream, Soda (  diet and regular), Sweet Tea, Candies, Chips, Cookies, Sweet Pastries,  Store Bought Juices, Alcohol in Excess of  1-2 drinks a day, Artificial Sweeteners, Coffee Creamer, and "Sugar-free" Products. This will help patient to have stable blood glucose profile and potentially avoid unintended weight gain.  - Patient is advised to stick to a routine mealtimes to eat 3 meals  a day and avoid unnecessary snacks ( to snack only to correct hypoglycemia).  - I have approached patient with the following individualized plan to manage diabetes and patient agrees.  - He has done reasonably very well on premixed Novolin 70/30. He wishes to stay on the same regimen for cost reasons.  -He is advised to continue his Novolin 70/30 to 45 units at breakfast and 35 units with supper  for pre-meal glucose of greater than 90 mg/dL. He is advised to continue strict  monitoring of glucose  4 times a day-before meals and at bedtime.   -He is warned  not to take insulin without proper monitoring per orders.  -Patient is encouraged to call clinic for blood glucose levels less than 70 or above 300 mg /dl. -He he is advised to continue low-dose metformin, 500 mg p.o. daily after breakfast.     - Patient specific target  for A1c; LDL, HDL, Triglycerides, and  Waist Circumference were discussed in detail.  2) BP/HTN: His blood pressure is not controlled to target.    He is advised to continue his current blood pressure medications including losartan 50 mg p.o. daily.    3) Lipids/HPL: His recent lipid panel showed controlled LDL at 55.  He is advised to continue pravastatin 40 mg p.o. nightly.   4)  Weight/Diet: CDE consult in progress, exercise, and carbohydrates information provided.  5) hypothyroidism:  -His  levothyroxine was recently increased to 100 mcg p.o. daily before breakfast.    - We discussed about the correct intake of his thyroid hormone, on empty stomach at fasting, with water, separated by at least 30 minutes from breakfast and other medications,  and separated by more than 4 hours from calcium, iron, multivitamins, acid reflux medications (PPIs). -Patient is made aware of the fact that thyroid hormone replacement is needed for life, dose to be adjusted by periodic monitoring of thyroid function tests.   6) Chronic Care/Health Maintenance:  -Patient is on ACEI/ARB and Statin medications and encouraged to continue to follow up with Ophthalmology, nephrology,  Podiatrist at least yearly or according to recommendations, and advised to  stay away from smoking. I have recommended yearly flu vaccine and pneumonia vaccination at least every 5 years;  and  sleep for at least 7 hours a day.  - I advised him to pick up compression stockings to wear on bilateral lower extremities during the times, to help with dependent edema on bilateral lower extremities.   - I advised patient to maintain close follow up with Fayrene Helper, MD for primary care needs. - Time spent on this patient care encounter:  35 min, of which > 50% was spent in  counseling and the rest reviewing his blood glucose logs , discussing his hypoglycemia and hyperglycemia episodes, reviewing his current and  previous labs / studies  ( including abstraction from other facilities) and medications  doses and developing a  long term treatment plan and documenting his care.   Please refer to Patient Instructions for Blood Glucose Monitoring and Insulin/Medications Dosing Guide"  in media tab for additional information. Please  also refer to "  Patient Self Inventory" in the Media  tab for reviewed elements of pertinent patient history.  Harlow Mares participated in the discussions, expressed understanding, and voiced agreement with  the above plans.  All questions were answered to his satisfaction. he is encouraged to contact clinic should he have any questions or concerns prior to his return visit.    Follow up plan: -Return in about 4 months (around 10/18/2019) for Bring Meter and Logs- A1c in Office, Follow up with Pre-visit Labs.  Glade Lloyd, MD Phone: [phone]  Fax: [phone]   -  This note was partially dictated with voice recognition software. Similar sounding words can be transcribed inadequately or may not  be corrected upon review.  06/20/2019, 5:27 PM

## 2019-06-20 NOTE — Patient Instructions (Signed)
                                     Advice for Weight Management  -For most of us the best way to lose weight is by diet management. Generally speaking, diet management means consuming less calories intentionally which over time brings about progressive weight loss.  This can be achieved more effectively by restricting carbohydrate consumption to the minimum possible.  So, it is critically important to know your numbers: how much calorie you are consuming and how much calorie you need. More importantly, our carbohydrates sources should be unprocessed or minimally processed complex starch food items.   Sometimes, it is important to balance nutrition by increasing protein intake (animal or plant source), fruits, and vegetables.  -Sticking to a routine mealtime to eat 3 meals a day and avoiding unnecessary snacks is shown to have a big role in weight control. Under normal circumstances, the only time we lose real weight is when we are hungry, so allow hunger to take place- hunger means no food between meal times, only water.  It is not advisable to starve.   -It is better to avoid simple carbohydrates including: Cakes, Sweet Desserts, Ice Cream, Soda (diet and regular), Sweet Tea, Candies, Chips, Cookies, Store Bought Juices, Alcohol in Excess of  1-2 drinks a day, Artificial Sweeteners, Doughnuts, Coffee Creamers, "Sugar-free" Products, etc, etc.  This is not a complete list.....    -Consulting with certified diabetes educators is proven to provide you with the most accurate and current information on diet.  Also, you may be  interested in discussing diet options/exchanges , we can schedule a visit with [name], RDN, CDE for individualized nutrition education.  -Exercise: If you are able: 30 -60 minutes a day ,4 days a week, or 150 minutes a week.  The longer the better.  Combine stretch, strength, and aerobic activities.  If you were told in the past that you  have high risk for cardiovascular diseases, you may seek evaluation by your heart doctor prior to initiating moderate to intense exercise programs.                                  Additional Care Considerations for Diabetes   -Diabetes  is a chronic disease.  The most important care consideration is regular follow-up with your diabetes care provider with the goal being avoiding or delaying its complications and to take advantage of advances in medications and technology.    -Type 2 diabetes is known to coexist with other important comorbidities such as high blood pressure and high cholesterol.  It is critical to control not only the diabetes but also the high blood pressure and high cholesterol to minimize and delay the risk of complications including coronary artery disease, stroke, amputations, blindness, etc.    - Studies showed that people with diabetes will benefit from a class of medications known as ACE inhibitors and statins.  Unless there are specific reasons not to be on these medications, the standard of care is to consider getting one from these groups of medications at an optimal doses.  These medications are generally considered safe and proven to help protect the heart and the kidneys.    - People with diabetes are encouraged to initiate and maintain regular follow-up with eye doctors, foot doctors, dentists ,   and if necessary heart and kidney doctors.     - It is highly recommended that people with diabetes quit smoking or stay away from smoking, and get yearly  flu vaccine and pneumonia vaccine at least every 5 years.  One other important lifestyle recommendation is to ensure adequate sleep - at least 6-7 hours of uninterrupted sleep at night.  -Exercise: If you are able: 30 -60 minutes a day, 4 days a week, or 150 minutes a week.  The longer the better.  Combine stretch, strength, and aerobic activities.  If you were told in the past that you have high risk for cardiovascular  diseases, you may seek evaluation by your heart doctor prior to initiating moderate to intense exercise programs.     COVID-19 Vaccine Information can be found at: [email or web address] For questions related to vaccine distribution or appointments, please email [email or web address] or call [phone].        

## 2019-06-21 ENCOUNTER — Telehealth: Payer: Self-pay

## 2019-06-21 NOTE — Telephone Encounter (Signed)
Advised patient he could get the vaccine with verbal understanding

## 2019-06-21 NOTE — Telephone Encounter (Signed)
Please advise if it is safe for PT to have the Covid Vaccination

## 2019-06-21 NOTE — Telephone Encounter (Signed)
Please advise 

## 2019-06-21 NOTE — Telephone Encounter (Signed)
Yes it is and highly recommended for him with his health conditions

## 2019-06-22 DIAGNOSIS — I25118 Atherosclerotic heart disease of native coronary artery with other forms of angina pectoris: Secondary | ICD-10-CM | POA: Diagnosis not present

## 2019-06-22 DIAGNOSIS — E559 Vitamin D deficiency, unspecified: Secondary | ICD-10-CM | POA: Diagnosis not present

## 2019-06-22 DIAGNOSIS — I10 Essential (primary) hypertension: Secondary | ICD-10-CM | POA: Diagnosis not present

## 2019-06-22 DIAGNOSIS — E785 Hyperlipidemia, unspecified: Secondary | ICD-10-CM | POA: Diagnosis not present

## 2019-06-22 DIAGNOSIS — M109 Gout, unspecified: Secondary | ICD-10-CM | POA: Diagnosis not present

## 2019-06-22 DIAGNOSIS — I5030 Unspecified diastolic (congestive) heart failure: Secondary | ICD-10-CM | POA: Diagnosis not present

## 2019-06-22 DIAGNOSIS — E119 Type 2 diabetes mellitus without complications: Secondary | ICD-10-CM | POA: Diagnosis not present

## 2019-06-22 DIAGNOSIS — I639 Cerebral infarction, unspecified: Secondary | ICD-10-CM | POA: Diagnosis not present

## 2019-06-24 DIAGNOSIS — H5200 Hypermetropia, unspecified eye: Secondary | ICD-10-CM | POA: Diagnosis not present

## 2019-06-24 DIAGNOSIS — Z0100 Encounter for examination of eyes and vision without abnormal findings: Secondary | ICD-10-CM | POA: Diagnosis not present

## 2019-06-24 LAB — HM DIABETES EYE EXAM

## 2019-06-28 ENCOUNTER — Other Ambulatory Visit: Payer: Self-pay | Admitting: "Endocrinology

## 2019-07-01 DIAGNOSIS — E119 Type 2 diabetes mellitus without complications: Secondary | ICD-10-CM | POA: Diagnosis not present

## 2019-07-01 DIAGNOSIS — I10 Essential (primary) hypertension: Secondary | ICD-10-CM | POA: Diagnosis not present

## 2019-07-01 DIAGNOSIS — E785 Hyperlipidemia, unspecified: Secondary | ICD-10-CM | POA: Diagnosis not present

## 2019-07-05 ENCOUNTER — Ambulatory Visit: Payer: Medicare HMO | Admitting: Family Medicine

## 2019-07-07 ENCOUNTER — Ambulatory Visit (INDEPENDENT_AMBULATORY_CARE_PROVIDER_SITE_OTHER): Payer: Medicare HMO | Admitting: Family Medicine

## 2019-07-07 ENCOUNTER — Other Ambulatory Visit: Payer: Self-pay

## 2019-07-07 ENCOUNTER — Encounter: Payer: Self-pay | Admitting: Family Medicine

## 2019-07-07 DIAGNOSIS — I10 Essential (primary) hypertension: Secondary | ICD-10-CM | POA: Diagnosis not present

## 2019-07-07 DIAGNOSIS — E782 Mixed hyperlipidemia: Secondary | ICD-10-CM

## 2019-07-07 DIAGNOSIS — E1121 Type 2 diabetes mellitus with diabetic nephropathy: Secondary | ICD-10-CM

## 2019-07-07 DIAGNOSIS — J449 Chronic obstructive pulmonary disease, unspecified: Secondary | ICD-10-CM

## 2019-07-07 DIAGNOSIS — Z794 Long term (current) use of insulin: Secondary | ICD-10-CM | POA: Diagnosis not present

## 2019-07-07 DIAGNOSIS — N182 Chronic kidney disease, stage 2 (mild): Secondary | ICD-10-CM | POA: Diagnosis not present

## 2019-07-07 MED ORDER — ALBUTEROL SULFATE HFA 108 (90 BASE) MCG/ACT IN AERS: 1.0000 | INHALATION_SPRAY | Freq: Four times a day (QID) | RESPIRATORY_TRACT | 1 refills | Status: DC | PRN

## 2019-07-07 MED ORDER — BUDESONIDE-FORMOTEROL FUMARATE 80-4.5 MCG/ACT IN AERO: 2.0000 | INHALATION_SPRAY | Freq: Every day | RESPIRATORY_TRACT | 12 refills | Status: DC

## 2019-07-07 NOTE — Progress Notes (Signed)
Virtual Visit via Telephone Note   This visit type was conducted due to national recommendations for restrictions regarding the COVID-19 Pandemic (e.g. social distancing) in an effort to limit this patient's exposure and mitigate transmission in our community.  Due to his co-morbid illnesses, this patient is at least at moderate risk for complications without adequate follow up.  This format is felt to be most appropriate for this patient at this time.  The patient did not have access to video technology/had technical difficulties with video requiring transitioning to audio format only (telephone).  All issues noted in this document were discussed and addressed.  No physical exam could be performed with this format.   Evaluation Performed:  Follow-up visit  Date:  07/07/2019   ID:  [name], DOB 1940-06-25, MRN [id number]  Patient Location: Home Provider Location: Office  Location of Patient: Home Location of Provider: Telehealth Consent was obtain for visit to be over via telehealth. I verified that I am speaking with the correct person using two identifiers.  PCP:  John Helper, MD   Chief Complaint:  4 month follow up   History of Present Illness:    [name] is a 79 y.o. male with history of abnormal gait, CAD, bronchitis COPD, hyperlipidemia, hypertension, morbid obesity, gout, type 2 diabetes, hypothyroidism.  Reports today for 40-month follow-up regarding chronic conditions as listed above. He reports that he does not have any sleep trouble as long as he takes his sleep medicine.  He reports that he is eating well he tries to eat in moderation to help with his blood sugars.  Reports that he is following Dr. Liliane [name] orders and recently saw him on February 1.  He reports that he does not have any skin issues or any signs and symptoms of infection at this time.  He reports that he does not have any problems with incontinence or changes in bowel or bladder.  He  reports that he has not had any changes in his memory.  And is not had any falls since his last fall last year.  He reports that he walks with a walker with a bench.   Reports taking all of his medications as directed.  Does not have any concerns today.  The patient does not have symptoms concerning for COVID-19 infection (fever, chills, cough, or new shortness of breath).   Past Medical, Surgical, Social History, Allergies, and Medications have been Reviewed.  Past Medical History:  Diagnosis Date  . Abnormality of gait 05/01/2015  . Anemia 04/19/2015  . Annual physical exam 12/11/2013  . Arthritis    "left leg; knees" (05/07/2017)  . Arthritis of knee   . CAD (coronary artery disease)   . Choking 07/13/2015  . Chronic bronchitis ([place])    "get it q yr"  . Chronic lower back pain    "since I was a teen" (05/07/2017)  . COPD (chronic obstructive pulmonary disease) ([place])   . CVA (cerebral vascular accident) ([place]) 05/2015   R sided weakness (05/07/2017)  . GERD (gastroesophageal reflux disease)   . Gout   . History of colonic polyps 04/03/2017   Added automatically from request for surgery [id number]  . Hyperlipidemia   . Hypertension   . Myocardial infarction ([place]) 1999  . Obesity   . OSA on CPAP   . Pneumonia    "a few times; last time was ~ 06/2013" (05/07/2017)  . Psoriasis   . Tussive syncope 05/01/2015  .  Type II diabetes mellitus ([place])   . Vitamin B12 deficiency 05/01/2015   Past Surgical History:  Procedure Laterality Date  . APPENDECTOMY    . CATARACT EXTRACTION W/PHACO Left 10/07/2016   Procedure: CATARACT EXTRACTION PHACO AND INTRAOCULAR LENS PLACEMENT (IOC);  Surgeon: John Guys, MD;  Location: [facility];  Service: Ophthalmology;  Laterality: Left;  CDE: 13.49  . CATARACT EXTRACTION W/PHACO Right 10/28/2016   Procedure: CATARACT EXTRACTION PHACO AND INTRAOCULAR LENS PLACEMENT (IOC);  Surgeon: John Guys, MD;  Location: [facility];  Service: Ophthalmology;  Laterality:  Right;  CDE: 16.71  . COLONOSCOPY  03/17/2012   Procedure: COLONOSCOPY;  Surgeon: John Houston, MD;  Location: [facility];  Service: Endoscopy;  Laterality: N/A;  830  . COLONOSCOPY N/A 06/25/2017   Procedure: COLONOSCOPY;  Surgeon: John Houston, MD;  [facility];  Service: Endoscopy;  Laterality: N/A;  [place]- 2009-08/30/2013   "counting today's, I have 5 stents" (08/30/2013)  . CORONARY ANGIOPLASTY WITH STENT PLACEMENT  05/07/2017  . CORONARY STENT INTERVENTION N/A 05/07/2017   Procedure: CORONARY STENT INTERVENTION;  Surgeon: John Forward, MD;  Location: [facility];  Service: Cardiovascular;  Laterality: N/A;  . CYSTOSCOPY WITH URETHRAL DILATATION N/A 11/26/2012   Procedure: CYSTOSCOPY WITH URETHRAL DILATATION;  Surgeon: John So, MD;  Location: [facility];  Service: Urology;  Laterality: N/A;  . LEFT HEART CATH AND CORONARY ANGIOGRAPHY N/A 05/07/2017   Procedure: LEFT HEART CATH AND CORONARY ANGIOGRAPHY;  Surgeon: John Forward, MD;  Location: [place] [facility] CV LAB;  Service: Cardiovascular;  Laterality: N/A;  . LEFT HEART CATHETERIZATION WITH CORONARY ANGIOGRAM N/A 08/30/2013   Procedure: LEFT HEART CATHETERIZATION WITH CORONARY ANGIOGRAM;  Surgeon: John Demark, MD;  Location: [facility] CATH LAB;  Service: Cardiovascular;  Laterality: N/A;  . PERCUTANEOUS CORONARY STENT INTERVENTION (PCI-S) Right 08/30/2013   Procedure: PERCUTANEOUS CORONARY STENT INTERVENTION (PCI-S);  Surgeon: John Demark, MD;  [facility] CATH LAB;  Service: Cardiovascular;  Laterality: Right;     No outpatient medications have been marked as taking for the 07/07/19 encounter (Office Visit) with John Mayo, NP.     Allergies:   Patient has no known allergies.   ROS:   Please see the history of present illness.    All other systems reviewed and are negative.   Labs/Other Tests and Data Reviewed:    Recent Labs: 07/13/2018: TSH  7.84 02/07/2019: ALT 8; BUN 20; Creat 1.01; Potassium 4.4; Sodium 137   Recent Lipid Panel Lab Results  Component Value Date/Time   CHOL 96 02/07/2019 09:35 AM   TRIG 212 (H) 02/07/2019 09:35 AM   HDL 23 (L) 02/07/2019 09:35 AM   CHOLHDL 4.2 02/07/2019 09:35 AM   LDLCALC 45 02/07/2019 09:35 AM   LDLDIRECT 50 08/18/2014 07:37 AM    Wt Readings from Last 3 Encounters:  06/20/19 262 lb 9.6 oz (119.1 kg)  03/01/19 256 lb (116.1 kg)  08/05/18 262 lb (118.8 kg)     Objective:    Vital Signs:  There were no vitals taken for this visit.   GEN:  Alert and oriented RESPIRATORY:  No shortness of breath noted in conversation PSYCH:  Normal affect and mood  ASSESSMENT & PLAN:    1. Chronic obstructive pulmonary disease, unspecified COPD type (HCC)  - albuterol (VENTOLIN HFA) 108 (90 Base) MCG/ACT inhaler; Inhale 1-2 puffs into the lungs every 6 (six) hours as needed for wheezing or  shortness of breath.  Dispense: 8 g; Refill: 1 - budesonide-formoterol (SYMBICORT) 80-4.5 MCG/ACT inhaler; Inhale 2 puffs into the lungs daily.  Dispense: 1 Inhaler; Refill: 12 - CBC - COMPLETE METABOLIC PANEL WITH GFR  2. Morbid obesity ([place])  - CBC - COMPLETE METABOLIC PANEL WITH GFR - Lipid panel  3. Type 2 diabetes mellitus with diabetic nephropathy, with long-term current use of insulin ([place])   4. CKD (chronic kidney disease) stage 2, GFR 60-89 ml/min   5. Mixed hyperlipidemia  - Lipid panel  6. Essential hypertension   Time:   Today, I have spent 25 minutes with the patient with telehealth technology discussing the above problems.     Medication Adjustments/Labs and Tests Ordered: Current medicines are reviewed at length with the patient today.  Concerns regarding medicines are outlined above.   Tests Ordered: Orders Placed This Encounter  Procedures  . CBC  . COMPLETE METABOLIC PANEL WITH GFR  . Lipid panel    Medication Changes: Meds ordered this encounter  Medications  .  albuterol (VENTOLIN HFA) 108 (90 Base) MCG/ACT inhaler    Sig: Inhale 1-2 puffs into the lungs every 6 (six) hours as needed for wheezing or shortness of breath.    Dispense:  8 g    Refill:  1    Order Specific Question:   Supervising Provider    Answer:   SIMPSON, MARGARET E [id number]  . budesonide-formoterol (SYMBICORT) 80-4.5 MCG/ACT inhaler    Sig: Inhale 2 puffs into the lungs daily.    Dispense:  1 Inhaler    Refill:  12    Order Specific Question:   Supervising Provider    Answer:   [name] [id number]    Disposition:  Follow up 4 months in office  Signed, John Mayo, NP  07/07/2019 4:08 PM     [facility]

## 2019-07-07 NOTE — Assessment & Plan Note (Addendum)
CPAP- at night O2 via [place] during the day-reports increase use. Ordered inahlers- as he reports he is not using Spiriva he did not think it helped. Might need referral to pulmonary if new inhalers do not help.

## 2019-07-07 NOTE — Assessment & Plan Note (Addendum)
Does not have BP reading for [place] today. Previously it has been elevated.  He is encouraged to maintain a DASH diet and exercise as he can.

## 2019-07-07 NOTE — Assessment & Plan Note (Signed)
Continue low fat heart healthy diet. Reports he eats what he wants, but tries to watch how much. Continue statin

## 2019-07-07 NOTE — Assessment & Plan Note (Signed)
[name] is re-educated about the importance of exercise daily to help with weight management. A minumum of 30 minutes daily is recommended. Additionally, importance of healthy food choices  with portion control discussed.   Wt Readings from Last 3 Encounters:  06/20/19 262 lb 9.6 oz (119.1 kg)  03/01/19 256 lb (116.1 kg)  08/05/18 262 lb (118.8 kg)

## 2019-07-07 NOTE — Patient Instructions (Signed)
Happy New Year! May you have a year filled with hope, love, happiness and laughter.  I appreciate the opportunity to provide you with care for your health and wellness. Today we discussed: overall health  Follow up: 4 months   Labs placed today-please get 1 week before next appt No referrals today  Continue work on eating well and walking as you can.  Please continue to practice social distancing to keep you, your family, and our community safe.  If you must go out, please wear a mask and practice good handwashing.  It was a pleasure to see you and I look forward to continuing to work together on your health and well-being. Please do not hesitate to call the office if you need care or have questions about your care.  Have a wonderful day and week. With Gratitude, Cherly Beach, DNP, AGNP-BC

## 2019-07-07 NOTE — Assessment & Plan Note (Signed)
Followed by Dr Shea Stakes is encouraged to check blood sugar daily as directed. Continue current medications. Is on statin as well. Educated on importance of maintain a well balanced diabetic friendly diet.  He is reminded the importance of maintaining  good blood sugars,  taking medications as directed, daily foot care, annual eye exams. Additionally educated about keeping good control over blood pressure and cholesterol as well.

## 2019-07-07 NOTE — Assessment & Plan Note (Signed)
Improved over last part of year in 2020, updated labs soon.

## 2019-07-08 ENCOUNTER — Ambulatory Visit: Payer: Medicare HMO | Admitting: Family Medicine

## 2019-07-10 DIAGNOSIS — J441 Chronic obstructive pulmonary disease with (acute) exacerbation: Secondary | ICD-10-CM | POA: Diagnosis not present

## 2019-07-10 DIAGNOSIS — Z8673 Personal history of transient ischemic attack (TIA), and cerebral infarction without residual deficits: Secondary | ICD-10-CM | POA: Diagnosis not present

## 2019-07-10 DIAGNOSIS — I5030 Unspecified diastolic (congestive) heart failure: Secondary | ICD-10-CM | POA: Diagnosis not present

## 2019-07-10 DIAGNOSIS — G4733 Obstructive sleep apnea (adult) (pediatric): Secondary | ICD-10-CM | POA: Diagnosis not present

## 2019-08-01 ENCOUNTER — Ambulatory Visit: Payer: Medicare HMO

## 2019-08-02 ENCOUNTER — Encounter: Payer: Medicare HMO | Admitting: Family Medicine

## 2019-08-07 DIAGNOSIS — G4733 Obstructive sleep apnea (adult) (pediatric): Secondary | ICD-10-CM | POA: Diagnosis not present

## 2019-08-07 DIAGNOSIS — J441 Chronic obstructive pulmonary disease with (acute) exacerbation: Secondary | ICD-10-CM | POA: Diagnosis not present

## 2019-08-07 DIAGNOSIS — I5030 Unspecified diastolic (congestive) heart failure: Secondary | ICD-10-CM | POA: Diagnosis not present

## 2019-08-07 DIAGNOSIS — Z8673 Personal history of transient ischemic attack (TIA), and cerebral infarction without residual deficits: Secondary | ICD-10-CM | POA: Diagnosis not present

## 2019-08-08 ENCOUNTER — Ambulatory Visit (INDEPENDENT_AMBULATORY_CARE_PROVIDER_SITE_OTHER): Payer: Medicare HMO

## 2019-08-08 ENCOUNTER — Other Ambulatory Visit: Payer: Self-pay

## 2019-08-08 VITALS — BP 138/80 | Ht 70.0 in | Wt 262.0 lb

## 2019-08-08 DIAGNOSIS — Z0000 Encounter for general adult medical examination without abnormal findings: Secondary | ICD-10-CM

## 2019-08-08 NOTE — Patient Instructions (Signed)
[name] , Thank you for taking time to come for your Medicare Wellness Visit. I appreciate your ongoing commitment to your health goals. Please review the following plan we discussed and let me know if I can assist you in the future.   Screening recommendations/referrals: Colonoscopy: up to date  Recommended yearly ophthalmology/optometry visit for glaucoma screening and checkup Recommended yearly dental visit for hygiene and checkup  Vaccinations: Influenza vaccine: up to date  Pneumococcal vaccine: up to date  Tdap vaccine: up to date  Shingles vaccine: due- can get at pharmacy    Advanced directives: form given previously   Conditions/risks identified: fall risk  Next appointment: 11/08/19 4:00pm  Preventive Care 40 Years and Older, Male Preventive care refers to lifestyle choices and visits with your health care provider that can promote health and wellness. What does preventive care include?  A yearly physical exam. This is also called an annual well check.  Dental exams once or twice a year.  Routine eye exams. Ask your health care provider how often you should have your eyes checked.  Personal lifestyle choices, including:  Daily care of your teeth and gums.  Regular physical activity.  Eating a healthy diet.  Avoiding tobacco and drug use.  Limiting alcohol use.  Practicing safe sex.  Taking low doses of aspirin every day.  Taking vitamin and mineral supplements as recommended by your health care provider. What happens during an annual well check? The services and screenings done by your health care provider during your annual well check will depend on your age, overall health, lifestyle risk factors, and family history of disease. Counseling  Your health care provider may ask you questions about your:  Alcohol use.  Tobacco use.  Drug use.  Emotional well-being.  Home and relationship well-being.  Sexual activity.  Eating habits.  History  of falls.  Memory and ability to understand (cognition).  Work and work Statistician. Screening  You may have the following tests or measurements:  Height, weight, and BMI.  Blood pressure.  Lipid and cholesterol levels. These may be checked every 5 years, or more frequently if you are over 30 years old.  Skin check.  Lung cancer screening. You may have this screening every year starting at age 3 if you have a 30-pack-year history of smoking and currently smoke or have quit within the past 15 years.  Fecal occult blood test (FOBT) of the stool. You may have this test every year starting at age 72.  Flexible sigmoidoscopy or colonoscopy. You may have a sigmoidoscopy every 5 years or a colonoscopy every 10 years starting at age 19.  Prostate cancer screening. Recommendations will vary depending on your family history and other risks.  Hepatitis C blood test.  Hepatitis B blood test.  Sexually transmitted disease (STD) testing.  Diabetes screening. This is done by checking your blood sugar (glucose) after you have not eaten for a while (fasting). You may have this done every 1-3 years.  Abdominal aortic aneurysm (AAA) screening. You may need this if you are a current or former smoker.  Osteoporosis. You may be screened starting at age 53 if you are at high risk. Talk with your health care provider about your test results, treatment options, and if necessary, the need for more tests. Vaccines  Your health care provider may recommend certain vaccines, such as:  Influenza vaccine. This is recommended every year.  Tetanus, diphtheria, and acellular pertussis (Tdap, Td) vaccine. You may need a Td booster  every 10 years.  Zoster vaccine. You may need this after age 20.  Pneumococcal 13-valent conjugate (PCV13) vaccine. One dose is recommended after age 15.  Pneumococcal polysaccharide (PPSV23) vaccine. One dose is recommended after age 72. Talk to your health care provider  about which screenings and vaccines you need and how often you need them. This information is not intended to replace advice given to you by your health care provider. Make sure you discuss any questions you have with your health care provider. Document Released: 06/01/2015 Document Revised: 01/23/2016 Document Reviewed: 03/06/2015 Elsevier Interactive Patient Education  2017 [place] Prevention in the Home Falls can cause injuries. They can happen to people of all ages. There are many things you can do to make your home safe and to help prevent falls. What can I do on the outside of my home?  Regularly fix the edges of walkways and driveways and fix any cracks.  Remove anything that might make you trip as you walk through a door, such as a raised step or threshold.  Trim any bushes or trees on the path to your home.  Use bright outdoor lighting.  Clear any walking paths of anything that might make someone trip, such as rocks or tools.  Regularly check to see if handrails are loose or broken. Make sure that both sides of any steps have handrails.  Any raised decks and porches should have guardrails on the edges.  Have any leaves, snow, or ice cleared regularly.  Use sand or salt on walking paths during winter.  Clean up any spills in your garage right away. This includes oil or grease spills. What can I do in the bathroom?  Use night lights.  Install grab bars by the toilet and in the tub and shower. Do not use towel bars as grab bars.  Use non-skid mats or decals in the tub or shower.  If you need to sit down in the shower, use a plastic, non-slip stool.  Keep the floor dry. Clean up any water that spills on the floor as soon as it happens.  Remove soap buildup in the tub or shower regularly.  Attach bath mats securely with double-sided non-slip rug tape.  Do not have throw rugs and other things on the floor that can make you trip. What can I do in the  bedroom?  Use night lights.  Make sure that you have a light by your bed that is easy to reach.  Do not use any sheets or blankets that are too big for your bed. They should not hang down onto the floor.  Have a firm chair that has side arms. You can use this for support while you get dressed.  Do not have throw rugs and other things on the floor that can make you trip. What can I do in the kitchen?  Clean up any spills right away.  Avoid walking on wet floors.  Keep items that you use a lot in easy-to-reach places.  If you need to reach something above you, use a strong step stool that has a grab bar.  Keep electrical cords out of the way.  Do not use floor polish or wax that makes floors slippery. If you must use wax, use non-skid floor wax.  Do not have throw rugs and other things on the floor that can make you trip. What can I do with my stairs?  Do not leave any items on the stairs.  Make  sure that there are handrails on both sides of the stairs and use them. Fix handrails that are broken or loose. Make sure that handrails are as long as the stairways.  Check any carpeting to make sure that it is firmly attached to the stairs. Fix any carpet that is loose or worn.  Avoid having throw rugs at the top or bottom of the stairs. If you do have throw rugs, attach them to the floor with carpet tape.  Make sure that you have a light switch at the top of the stairs and the bottom of the stairs. If you do not have them, ask someone to add them for you. What else can I do to help prevent falls?  Wear shoes that:  Do not have high heels.  Have rubber bottoms.  Are comfortable and fit you well.  Are closed at the toe. Do not wear sandals.  If you use a stepladder:  Make sure that it is fully opened. Do not climb a closed stepladder.  Make sure that both sides of the stepladder are locked into place.  Ask someone to hold it for you, if possible.  Clearly mark and make  sure that you can see:  Any grab bars or handrails.  First and last steps.  Where the edge of each step is.  Use tools that help you move around (mobility aids) if they are needed. These include:  Canes.  Walkers.  Scooters.  Crutches.  Turn on the lights when you go into a dark area. Replace any light bulbs as soon as they burn out.  Set up your furniture so you have a clear path. Avoid moving your furniture around.  If any of your floors are uneven, fix them.  If there are any pets around you, be aware of where they are.  Review your medicines with your doctor. Some medicines can make you feel dizzy. This can increase your chance of falling. Ask your doctor what other things that you can do to help prevent falls. This information is not intended to replace advice given to you by your health care provider. Make sure you discuss any questions you have with your health care provider. Document Released: 03/01/2009 Document Revised: 10/11/2015 Document Reviewed: 06/09/2014 Elsevier Interactive Patient Education  2017 [employer].

## 2019-08-08 NOTE — Progress Notes (Signed)
Subjective:   [name] is a 79 y.o. male who presents for Medicare Annual/Subsequent preventive examination.  Review of Systems:   Cardiac Risk Factors include: advanced age (>21mn, >>51women);diabetes mellitus;dyslipidemia;hypertension;obesity (BMI >30kg/m2);sedentary lifestyle;smoking/ tobacco exposure     Objective:    Vitals: BP 138/80   Ht _0  (1.778 m)   Wt 262 lb (118.8 kg)   BMI 37.59 kg/m   Body mass index is 37.59 kg/m.  Advanced Directives 07/26/2018 06/29/2017 06/26/2017 06/25/2017 05/07/2017 03/06/2017 10/28/2016  Does Patient Have a Medical Advance Directive? _1  No No  Does patient want to make changes to medical advance directive? - - - - - - -  Would patient like information on creating a medical advance directive? No - Patient declined No - Patient declined No - Patient declined No - Patient declined No - Patient declined - No - Patient declined  Pre-existing out of facility DNR order (yellow form or pink MOST form) - - - - - - -    Tobacco Social History   Tobacco Use  Smoking Status Former Smoker  . Packs/day: 0.50  . Years: 33.00  . Pack years: 16.50  . Types: Cigarettes  . Quit date: 11/29/2002  . Years since quitting: 16.7  Smokeless Tobacco Never Used     Counseling given: Not Answered   Clinical Intake:  Pre-visit preparation completed: Yes  Pain : No/denies pain Pain Score: 0-No pain     Nutritional Status: BMI > 30  Obese Diabetes: Yes CBG done?: No Did pt. bring in CBG monitor from home?: No  How often do you need to have someone help you when you read instructions, pamphlets, or other written materials from your doctor or pharmacy?: 2 - Rarely What is the last grade level you completed in school?: 11 th grade  Interpreter Needed?: No     Past Medical History:  Diagnosis Date  . Abnormality of gait 05/01/2015  . Anemia 04/19/2015  . Annual physical exam 12/11/2013  . Arthritis    "left leg; knees"  (05/07/2017)  . Arthritis of knee   . CAD (coronary artery disease)   . Choking 07/13/2015  . Chronic bronchitis ([place]    "get it q yr"  . Chronic lower back pain    "since I was a teen" (05/07/2017)  . COPD (chronic obstructive pulmonary disease) ([facility]   . CV[place] (cerebral vascular accident) ([facility] 05/2015   R sided weakness (05/07/2017)  . GERD (gastroesophageal reflux disease)   . Gout   . History of colonic polyps 04/03/2017   Added automatically from request for surgery [id number]  . Hyperlipidemia   . Hypertension   . Myocardial infarction ([facility] 1999  . Obesity   . OSA on CPAP   . Pneumonia    "a few times; last time was ~ 06/2013" (05/07/2017)  . Psoriasis   . Tussive syncope 05/01/2015  . Type II diabetes mellitus ([facility]   . Vitamin B12 deficiency 05/01/2015   Past Surgical History:  Procedure Laterality Date  . APPENDECTOMY    . CATARACT EXTRACTION W/PHACO Left 10/07/2016   Procedure: CATARACT EXTRACTION PHACO AND INTRAOCULAR LENS PLACEMENT (IOC);  Surgeon: SRutherford Guys MD;  Location: [facility];  Service: Ophthalmology;  Laterality: Left;  CDE: 13.49  . CATARACT EXTRACTION W/PHACO Right 10/28/2016   Procedure: CATARACT EXTRACTION PHACO AND INTRAOCULAR LENS PLACEMENT (IOC);  Surgeon: SRutherford Guys MD;  Location: [facility];  Service: Ophthalmology;  Laterality: Right;  CDE: 16.71  .  COLONOSCOPY  03/17/2012   Procedure: COLONOSCOPY;  Surgeon: Rogene Houston, MD;  [facility];  Service: Endoscopy;  Laterality: N/A;  830  . COLONOSCOPY N/A 06/25/2017   Procedure: COLONOSCOPY;  Surgeon: Rogene Houston, MD;  [facility];  Service: Endoscopy;  Laterality: N/A;  [place]- 2009-08/30/2013   "counting today's, I have 5 stents" (08/30/2013)  . CORONARY ANGIOPLASTY WITH STENT PLACEMENT  05/07/2017  . CORONARY STENT INTERVENTION N/A 05/07/2017   Procedure: CORONARY STENT INTERVENTION;  Surgeon: Charolette Forward, MD;  Location: [place] [facility];  Service: Cardiovascular;  Laterality: N/A;  . CYSTOSCOPY WITH URETHRAL DILATATION N/A 11/26/2012   Procedure: CYSTOSCOPY WITH URETHRAL DILATATION;  Surgeon: Malka So, MD;  Location: [facility];  Service: Urology;  Laterality: N/A;  . LEFT HEART CATH AND CORONARY ANGIOGRAPHY N/A 05/07/2017   Procedure: LEFT HEART CATH AND CORONARY ANGIOGRAPHY;  Surgeon: Charolette Forward, MD;  Location: [place] [facility];  Service: Cardiovascular;  Laterality: N/A;  . LEFT HEART CATHETERIZATION WITH CORONARY ANGIOGRAM N/A 08/30/2013   Procedure: LEFT HEART CATHETERIZATION WITH CORONARY ANGIOGRAM;  Surgeon: Clent Demark, MD;  Location: [place] CATH LAB;  Service: Cardiovascular;  Laterality: N/A;  . PERCUTANEOUS CORONARY STENT INTERVENTION (PCI-S) Right 08/30/2013   Procedure: PERCUTANEOUS CORONARY STENT INTERVENTION (PCI-S);  Surgeon: Clent Demark, MD;  Location[facility] C[facility];  Service: Cardiovascular;  Laterality: Right;   Family History  Problem Relation Age of Onset  . Hypertension Mother   . Diabetes Mother   . Hypertension Father   . Diabetes Brother   . Stroke Daughter   . Arthritis Other        Family History   . Diabetes Other        family History    Social History   Socioeconomic History  . Marital status: Married    Spouse name: Not on file  . Number of children: 2  . Years of education: 27  . Highest education level: 12th grade  Occupational History  . Occupation: Brewing technologist     Comment: retired   Tobacco Use  . Smoking status: Former Smoker    Packs/day: 0.50    Years: 33.00    Pack years: 16.50    Types: Cigarettes    Quit date: 11/29/2002    Years since quitting: 16.7  . Smokeless tobacco: Never Used  Substance and Sexual Activity  . Alcohol use: No  . Drug use: No  . Sexual activity: Not Currently  Other Topics Concern  . Not on file  Social History Narrative   Patient drinks about 3 cups of soda daily.    Patient is left handed.    Lives alone  with wife    Social Determinants of Health   Financial Resource Strain:   . Difficulty of Paying Living Expenses:   Food Insecurity: Unknown  . Worried About Charity fundraiser in the Last Year: Not on file  . Ran Out of Food in the Last Year: Never true  Transportation Needs: No Transportation Needs  . Lack of Transportation (Medical): No  . Lack of Transportation (Non-Medical): No  Physical Activity: Inactive  . Days of Exercise per Week: 0 days  . Minutes of Exercise per Session: 0 min  Stress: No Stress Concern Present  . Feeling of Stress : Not at all  Social Connections: Somewhat Isolated  . Frequency of Communication with Friends and Family: Twice  a week  . Frequency of Social Gatherings with Friends and Family: Once a week  . Attends Religious Services: Never  . Active Member of Clubs or Organizations: No  . Attends Archivist Meetings: Never  . Marital Status: Married    Outpatient Encounter Medications as of 08/08/2019  Medication Sig  . ACCU-CHEK AVIVA PLUS test strip TEST FOUR TIMES DAILY  . acetaminophen (TYLENOL) 500 MG tablet Take 1,000 mg by mouth every 6 (six) hours as needed for moderate pain (left thigh).   Marland Kitchen albuterol (VENTOLIN HFA) 108 (90 Base) MCG/ACT inhaler Inhale 1-2 puffs into the lungs every 6 (six) hours as needed for wheezing or shortness of breath.  . allopurinol (ZYLOPRIM) 300 MG tablet Take 1 tablet (300 mg total) by mouth daily.  Marland Kitchen aspirin EC 81 MG tablet Take 81 mg by mouth daily.  . Blood Glucose Monitoring Suppl (ACCU-CHEK AVIVA PLUS) w/Device KIT 1 each by Does not apply route 2 (two) times a day.  . budesonide-formoterol (SYMBICORT) 80-4.5 MCG/ACT inhaler Inhale 2 puffs into the lungs daily.  . clopidogrel (PLAVIX) 75 MG tablet TAKE 1 TABLET (75 MG TOTAL) BY MOUTH DAILY.  Marland Kitchen ezetimibe (ZETIA) 10 MG tablet Take 1 tablet (10 mg total) by mouth daily.  . ferrous sulfate 325 (65 FE) MG tablet Take 1 tablet (325 mg total) by mouth 3  (three) times daily with meals.  . insulin NPH-regular Human (NOVOLIN 70/30) (70-30) 100 UNIT/ML injection INJECT 45 UNITS SUBCUTANEOUSLY WITH BREAKFAST AND 35 UNITS WITH SUPPER (WHEN GLUCOSE IS ABOVE 90)  . levETIRAcetam (KEPPRA) 500 MG tablet TAKE 1 TABLET TWICE DAILY  . levothyroxine (SYNTHROID) 100 MCG tablet TAKE 1 TABLET (100 MCG TOTAL) BY MOUTH DAILY BEFORE BREAKFAST.  Marland Kitchen losartan (COZAAR) 50 MG tablet Take 50 mg by mouth daily.  . meclizine (ANTIVERT) 25 MG tablet Take 1 tablet (25 mg total) by mouth 3 (three) times daily as needed for dizziness.  . metFORMIN (GLUCOPHAGE) 500 MG tablet TAKE 1 TABLET EVERY DAY WITH BREAKFAST  . metoprolol tartrate (LOPRESSOR) 50 MG tablet TAKE 1/2 TABLET TWICE DAILY  . nitroGLYCERIN (NITROSTAT) 0.4 MG SL tablet Place 1 tablet (0.4 mg total) under the tongue every 5 (five) minutes as needed for chest pain.  . OXYGEN Inhale 2 L into the lungs continuous.  . pantoprazole (PROTONIX) 40 MG tablet TAKE 1 TABLET EVERY DAY BEFORE BREAKFAST  . pravastatin (PRAVACHOL) 40 MG tablet TAKE 2 TABLETS EVERY DAY  . senna (SENOKOT) 8.6 MG tablet Take 1-2 tablets by mouth daily as needed for constipation.   Marland Kitchen spironolactone (ALDACTONE) 25 MG tablet TAKE 1 TABLET EVERY DAY  . tiotropium (SPIRIVA HANDIHALER) 18 MCG inhalation capsule Place 1 capsule (18 mcg total) into inhaler and inhale daily. (Patient taking differently: Place 18 mcg into inhaler and inhale daily as needed (for shortness of breath or wheezing). )  . triamcinolone cream (KENALOG) 0.1 % Apply 1 application topically 2 (two) times daily. Apply twice daily to rash on right elbow  , under a moist wrap for 4 weeks  . UNABLE TO FIND OCD evaluation  DX COPD  . UNABLE TO FIND Standing walker with bench Dx g81.91,  . vitamin B-12 (CYANOCOBALAMIN) 1000 MCG tablet Take 1,000 mcg by mouth daily.  . Vitamin D, Ergocalciferol, (DRISDOL) 1.25 MG (50000 UT) CAPS capsule TAKE 1 CAPSULE ONCE WEEKLY   No facility-administered  encounter medications on file as of 08/08/2019.    Activities of Daily Living In your present state of  health, do you have any difficulty performing the following activities: 08/08/2019  Hearing? N  Vision? N  Difficulty concentrating or making decisions? N  Walking or climbing stairs? Y  Dressing or bathing? N  Doing errands, shopping? Y  Preparing Food and eating ? N  Using the Toilet? N  In the past six months, have you accidently leaked urine? N  Do you have problems with loss of bowel control? N  Managing your Medications? N  Managing your Finances? N  Housekeeping or managing your Housekeeping? N  Some recent data might be hidden    Patient Care Team: Fayrene Helper, MD as PCP - Wardell Honour, MD as Consulting Physician (Pulmonary Disease) Charolette Forward, MD as Consulting Physician (Cardiology) Rutherford Guys, MD as Consulting Physician (Ophthalmology) Kathrynn Ducking, MD as Consulting Physician (Neurology)   Assessment:   This is a routine wellness examination for [name].  Exercise Activities and Dietary recommendations Current Exercise Habits: Home exercise routine, Time (Minutes): 10, Frequency (Times/Week): 1, Weekly Exercise (Minutes/Week): 10, Intensity: Mild, Exercise limited by: respiratory conditions(s)  Goals    . Exercise 3x per week (30 min per time)     Recommend starting chair exercises 3 times a week for at least 30 minutes at a time as tolerated.    . Patient Stated     Prevent falls     . water (pt-stated)     Increase water intake        Fall Risk Fall Risk  08/08/2019 07/07/2019 03/02/2019 03/01/2019 08/05/2018  Falls in the past year? 1 0 1 1 0  Comment - - - - -  Number falls in past yr: 1 0 1 1 -  Comment - - - - -  Injury with Fall? 0 0 0 0 -  Comment - - - - -  Risk Factor Category  - - - - -  Risk for fall due to : - History of fall(s) Impaired balance/gait;Impaired mobility Orthopedic patient;Impaired mobility -  Risk  for fall due to: Comment - - - - -  Follow up - Falls evaluation completed;Education provided Falls prevention discussed;Falls evaluation completed - Falls evaluation completed   Is the patient's home free of loose throw rugs in walkways, pet beds, electrical cords, etc?   yes      Grab bars in the bathroom? yes      Handrails on the stairs?   yes      Adequate lighting?   yes  Timed Get Up and Go Performed:  Visit done in office   Depression Screen PHQ 2/9 Scores 07/07/2019 07/26/2018 07/12/2018 02/23/2018  PHQ - 2 Score 0 0 0 0    Cognitive Function MMSE - Mini Mental State Exam 05/01/2015  Orientation to time 5  Orientation to Place 4  Registration 3  Attention/ Calculation 5  Recall 2  Language- name 2 objects 2  Language- repeat 1  Language- follow 3 step command 3  Language- read & follow direction 1  Write a sentence 1  Copy design 0  Total score 27     6CIT Screen 08/08/2019 07/26/2018 06/29/2017 07/01/2016  What Year? 0 points 0 points 0 points 0 points  What month? 0 points 0 points 0 points 0 points  What time? 0 points 0 points 0 points 0 points  Count back from 20 0 points 0 points 0 points 0 points  Months in reverse 0 points 0 points 0 points 0 points  Repeat phrase  0 points 2 points 0 points 0 points  Total Score 0 2 0 0    Immunization History  Administered Date(s) Administered  . Fluad Quad(high Dose 65+) 01/31/2019  . Influenza Whole 03/18/2005  . Influenza, High Dose Seasonal PF 02/22/2018  . Influenza,inj,Quad PF,6+ Mos 03/19/2017  . Pneumococcal Conjugate-13 12/08/2013  . Pneumococcal Polysaccharide-23 06/09/2012  . Td 12/04/2003  . Tdap 03/06/2017    Qualifies for Shingles Vaccine? yes  Screening Tests Health Maintenance  Topic Date Due  . FOOT EXAM  07/15/2019  . HEMOGLOBIN A1C  12/18/2019  . OPHTHALMOLOGY EXAM  06/23/2020  . TETANUS/TDAP  03/07/2027  . INFLUENZA VACCINE  Completed  . PNA vac Low Risk Adult  Completed   Cancer  Screenings: Lung: Low Dose CT Chest recommended if Age 43-80 years, 30 pack-year currently smoking OR have quit w/in 15years. Patient does not qualify. Colorectal: up to date   Additional Screenings:  Hepatitis C Screening: not required       Plan:     I have personally reviewed and noted the following in the patient's chart:   . Medical and social history . Use of alcohol, tobacco or illicit drugs  . Current medications and supplements . Functional ability and status . Nutritional status . Physical activity . Advanced directives . List of other physicians . Hospitalizations, surgeries, and ER visits in previous 12 months . Vitals . Screenings to include cognitive, depression, and falls . Referrals and appointments  In addition, I have reviewed and discussed with patient certain preventive protocols, quality metrics, and best practice recommendations. A written personalized care plan for preventive services as well as general preventive health recommendations were provided to patient.     Kate Sable, LPN, LPN  2/84/1324

## 2019-08-23 ENCOUNTER — Other Ambulatory Visit: Payer: Self-pay | Admitting: "Endocrinology

## 2019-08-26 ENCOUNTER — Encounter (HOSPITAL_COMMUNITY): Payer: Self-pay | Admitting: *Deleted

## 2019-08-26 ENCOUNTER — Emergency Department (HOSPITAL_COMMUNITY): Payer: Medicare HMO

## 2019-08-26 ENCOUNTER — Other Ambulatory Visit: Payer: Self-pay

## 2019-08-26 ENCOUNTER — Other Ambulatory Visit: Payer: Self-pay | Admitting: Family Medicine

## 2019-08-26 ENCOUNTER — Emergency Department (HOSPITAL_COMMUNITY)
Admission: EM | Admit: 2019-08-26 | Discharge: 2019-08-26 | Disposition: A | Discharge status: Home / Self Care | Payer: Medicare HMO | Source: Home / Self Care | Attending: Emergency Medicine | Admitting: Emergency Medicine

## 2019-08-26 ENCOUNTER — Other Ambulatory Visit: Payer: Self-pay | Admitting: "Endocrinology

## 2019-08-26 DIAGNOSIS — N390 Urinary tract infection, site not specified: Secondary | ICD-10-CM | POA: Insufficient documentation

## 2019-08-26 DIAGNOSIS — R140 Abdominal distension (gaseous): Secondary | ICD-10-CM

## 2019-08-26 DIAGNOSIS — K56609 Unspecified intestinal obstruction, unspecified as to partial versus complete obstruction: Secondary | ICD-10-CM | POA: Diagnosis not present

## 2019-08-26 DIAGNOSIS — I129 Hypertensive chronic kidney disease with stage 1 through stage 4 chronic kidney disease, or unspecified chronic kidney disease: Secondary | ICD-10-CM | POA: Insufficient documentation

## 2019-08-26 DIAGNOSIS — Z79899 Other long term (current) drug therapy: Secondary | ICD-10-CM | POA: Insufficient documentation

## 2019-08-26 DIAGNOSIS — Z7902 Long term (current) use of antithrombotics/antiplatelets: Secondary | ICD-10-CM | POA: Insufficient documentation

## 2019-08-26 DIAGNOSIS — Z794 Long term (current) use of insulin: Secondary | ICD-10-CM | POA: Insufficient documentation

## 2019-08-26 DIAGNOSIS — Z03818 Encounter for observation for suspected exposure to other biological agents ruled out: Secondary | ICD-10-CM | POA: Diagnosis not present

## 2019-08-26 DIAGNOSIS — K5900 Constipation, unspecified: Secondary | ICD-10-CM | POA: Insufficient documentation

## 2019-08-26 DIAGNOSIS — R630 Anorexia: Secondary | ICD-10-CM | POA: Insufficient documentation

## 2019-08-26 DIAGNOSIS — R109 Unspecified abdominal pain: Secondary | ICD-10-CM | POA: Diagnosis not present

## 2019-08-26 DIAGNOSIS — Z7982 Long term (current) use of aspirin: Secondary | ICD-10-CM | POA: Insufficient documentation

## 2019-08-26 DIAGNOSIS — R142 Eructation: Secondary | ICD-10-CM | POA: Insufficient documentation

## 2019-08-26 DIAGNOSIS — E039 Hypothyroidism, unspecified: Secondary | ICD-10-CM | POA: Insufficient documentation

## 2019-08-26 DIAGNOSIS — J449 Chronic obstructive pulmonary disease, unspecified: Secondary | ICD-10-CM | POA: Insufficient documentation

## 2019-08-26 DIAGNOSIS — Z955 Presence of coronary angioplasty implant and graft: Secondary | ICD-10-CM | POA: Insufficient documentation

## 2019-08-26 DIAGNOSIS — N182 Chronic kidney disease, stage 2 (mild): Secondary | ICD-10-CM | POA: Insufficient documentation

## 2019-08-26 DIAGNOSIS — Z87891 Personal history of nicotine dependence: Secondary | ICD-10-CM | POA: Insufficient documentation

## 2019-08-26 DIAGNOSIS — E1122 Type 2 diabetes mellitus with diabetic chronic kidney disease: Secondary | ICD-10-CM | POA: Insufficient documentation

## 2019-08-26 DIAGNOSIS — R1110 Vomiting, unspecified: Secondary | ICD-10-CM | POA: Diagnosis not present

## 2019-08-26 DIAGNOSIS — I2510 Atherosclerotic heart disease of native coronary artery without angina pectoris: Secondary | ICD-10-CM | POA: Insufficient documentation

## 2019-08-26 LAB — URINALYSIS, ROUTINE W REFLEX MICROSCOPIC
Bilirubin Urine: NEGATIVE
Glucose, UA: NEGATIVE mg/dL
Hgb urine dipstick: NEGATIVE
Ketones, ur: NEGATIVE mg/dL
Nitrite: NEGATIVE
Protein, ur: NEGATIVE mg/dL
Specific Gravity, Urine: 1.016 (ref 1.005–1.030)
pH: 5 (ref 5.0–8.0)

## 2019-08-26 LAB — CBC WITH DIFFERENTIAL/PLATELET
Abs Immature Granulocytes: 0.07 10*3/uL (ref 0.00–0.07)
Basophils Absolute: 0.1 10*3/uL (ref 0.0–0.1)
Basophils Relative: 1 %
Eosinophils Absolute: 0.6 10*3/uL — ABNORMAL HIGH (ref 0.0–0.5)
Eosinophils Relative: 5 %
HCT: 36.6 % — ABNORMAL LOW (ref 39.0–52.0)
Hemoglobin: 11.1 g/dL — ABNORMAL LOW (ref 13.0–17.0)
Immature Granulocytes: 1 %
Lymphocytes Relative: 22 %
Lymphs Abs: 2.5 10*3/uL (ref 0.7–4.0)
MCH: 27.9 pg (ref 26.0–34.0)
MCHC: 30.3 g/dL (ref 30.0–36.0)
MCV: 92 fL (ref 80.0–100.0)
Monocytes Absolute: 0.9 10*3/uL (ref 0.1–1.0)
Monocytes Relative: 8 %
Neutro Abs: 7.5 10*3/uL (ref 1.7–7.7)
Neutrophils Relative %: 63 %
Platelets: 299 10*3/uL (ref 150–400)
RBC: 3.98 MIL/uL — ABNORMAL LOW (ref 4.22–5.81)
RDW: 15.2 % (ref 11.5–15.5)
WBC: 11.7 10*3/uL — ABNORMAL HIGH (ref 4.0–10.5)
nRBC: 0 % (ref 0.0–0.2)

## 2019-08-26 LAB — COMPREHENSIVE METABOLIC PANEL
ALT: 12 U/L (ref 0–44)
AST: 14 U/L — ABNORMAL LOW (ref 15–41)
Albumin: 3.6 g/dL (ref 3.5–5.0)
Alkaline Phosphatase: 63 U/L (ref 38–126)
Anion gap: 7 (ref 5–15)
BUN: 18 mg/dL (ref 8–23)
CO2: 26 mmol/L (ref 22–32)
Calcium: 8.9 mg/dL (ref 8.9–10.3)
Chloride: 102 mmol/L (ref 98–111)
Creatinine, Ser: 1.1 mg/dL (ref 0.61–1.24)
GFR calc Af Amer: >60 mL/min (ref >60)
GFR calc non Af Amer: >60 mL/min (ref >60)
Glucose, Bld: 133 mg/dL — ABNORMAL HIGH (ref 70–99)
Potassium: 4.6 mmol/L (ref 3.5–5.1)
Sodium: 135 mmol/L (ref 135–145)
Total Bilirubin: 0.4 mg/dL (ref 0.3–1.2)
Total Protein: 7.6 g/dL (ref 6.5–8.1)

## 2019-08-26 LAB — LIPASE, BLOOD: Lipase: 25 U/L (ref 11–51)

## 2019-08-26 MED ORDER — CEPHALEXIN 500 MG PO CAPS: 500.0000 mg | ORAL_CAPSULE | Freq: Three times a day (TID) | ORAL | 0 refills | Status: DC

## 2019-08-26 MED ORDER — IOHEXOL 300 MG/ML  SOLN
100.0000 mL | Freq: Once | INTRAMUSCULAR | Status: AC | PRN
  Administered 2019-08-26: 18:00:00 100 mL via INTRAVENOUS

## 2019-08-26 MED ORDER — CEPHALEXIN 500 MG PO CAPS
500.0000 mg | ORAL_CAPSULE | Freq: Once | ORAL | Status: AC
  Administered 2019-08-26: 20:00:00 500 mg via ORAL
  Filled 2019-08-26: qty 1

## 2019-08-26 NOTE — Discharge Instructions (Addendum)
Your scan shows constipation. Your lab work overall looks good, does show signs of urinary tract infection. Please take antibiotics as directed.  Put 8 capfuls of Miralax into a 32 oz gatorade zero or other sugar free beverage and drink over 2-3 hours. You can also take senna which you have at home. I would also like for you to use Gas-X daily. Please make sure you're also drinking plenty of water.  If you're still unable to have a bowel movement begin having abdominal pain or vomiting or any other new or concerning symptoms please return to the ED otherwise please follow-up with your primary doctor.

## 2019-08-26 NOTE — ED Triage Notes (Signed)
C/o abdominal pain onset this am, states he feels bloated

## 2019-08-26 NOTE — ED Provider Notes (Signed)
[facility] EMERGENCY DEPARTMENT Provider Note   CSN: [id number] Arrival date & time: 08/26/19  1442     History Chief Complaint  Patient presents with  . Abdominal Pain    [name] is a 79 y.o. male.  [name] is a 79 y.o. male CAD, hypertension, hyperlipidemia, diabetes, GERD, COPD on chronic O2, sleep apnea, obesity, who presents to the emergency department for evaluation of abdominal distention and discomfort.  He reports symptoms began this morning.  He reports that he has not had any associated nausea or vomiting but has had frequent sour belches.  He states that he is felt like he needed to have a bowel movement, but he has been unable to pass stool.  Reports yesterday he had normal bowel movements.  He reports his abdomen feels bloated.  He has not had any fevers or chills.  No chest pain or shortness of breath, he is on his baseline home oxygen.  No dysuria or urinary frequency.  Has not noted any blood in his stools recently.  Reports remote history of appendectomy, no more recent abdominal surgeries.  No other aggravating or alleviating factors.        Past Medical History:  Diagnosis Date  . Abnormality of gait 05/01/2015  . Anemia 04/19/2015  . Annual physical exam 12/11/2013  . Arthritis    "left leg; knees" (05/07/2017)  . Arthritis of knee   . CAD (coronary artery disease)   . Choking 07/13/2015  . Chronic bronchitis ([place])    "get it q yr"  . Chronic lower back pain    "since I was a teen" (05/07/2017)  . COPD (chronic obstructive pulmonary disease) ([place])   . CVA (cerebral vascular accident) ([place]) 05/2015   R sided weakness (05/07/2017)  . GERD (gastroesophageal reflux disease)   . Gout   . History of colonic polyps 04/03/2017   Added automatically from request for surgery [id number]  . Hyperlipidemia   . Hypertension   . Myocardial infarction ([place]) 1999  . Obesity   . OSA on CPAP   . Pneumonia    "a few times; last time was ~ 06/2013"  (05/07/2017)  . Psoriasis   . Tussive syncope 05/01/2015  . Type II diabetes mellitus ([place])   . Vitamin B12 deficiency 05/01/2015    Patient Active Problem List   Diagnosis Date Noted  . At moderate risk for fall 03/02/2019  . Longitudinal overcurvature of nails 07/14/2018  . Uncontrolled type 2 diabetes mellitus with hyperglycemia ([place]) 09/07/2017  . New-onset angina ([place]) 05/07/2017  . CKD (chronic kidney disease) stage 2, GFR 60-89 ml/min 06/04/2015  . Hypothyroidism 06/04/2015  . Back pain 06/01/2015  . Partial seizure ([place]) 05/12/2015  . Right hemiparesis ([place]) 05/12/2015  . Stroke ([place]) 05/10/2015  . GERD (gastroesophageal reflux disease) 05/10/2015  . CVA (cerebral infarction) 05/10/2015  . Vitamin B12 deficiency 05/01/2015  . Abnormality of gait 05/01/2015  . Tussive syncope 05/01/2015  . Recurrent falls 04/29/2015  . Dysphagia 04/26/2015  . Carotid bruit 12/08/2013  . Type 2 diabetes mellitus with diabetic nephropathy, with long-term current use of insulin ([place]) 05/22/2013  . COPD (chronic obstructive pulmonary disease) ([place]) 11/22/2012  . Hypoxia 08/30/2012  . Vitamin D deficiency 12/23/2011  . PSORIASIS 07/18/2010  . Gout 01/13/2010  . Mixed hyperlipidemia 10/05/2007  . Morbid obesity ([place]) 10/05/2007  . Essential hypertension 10/05/2007  . Coronary atherosclerosis 10/05/2007  . GERD 10/05/2007  . Arthritis of knee, right 10/05/2007  Past Surgical History:  Procedure Laterality Date  . APPENDECTOMY    . CATARACT EXTRACTION W/PHACO Left 10/07/2016   Procedure: CATARACT EXTRACTION PHACO AND INTRAOCULAR LENS PLACEMENT (IOC);  Surgeon: Rutherford Guys, MD;  Location: [facility];  Service: Ophthalmology;  Laterality: Left;  CDE: 13.49  . CATARACT EXTRACTION W/PHACO Right 10/28/2016   Procedure: CATARACT EXTRACTION PHACO AND INTRAOCULAR LENS PLACEMENT (IOC);  Surgeon: Rutherford Guys, MD;  Location: [facility];  Service: Ophthalmology;  Laterality: Right;  CDE: 16.71  .  COLONOSCOPY  03/17/2012   Procedure: COLONOSCOPY;  Surgeon: Rogene Houston, MD;  Location: [facility];  Service: Endoscopy;  Laterality: N/A;  830  . COLONOSCOPY N/A 06/25/2017   Procedure: COLONOSCOPY;  Surgeon: Rogene Houston, MD;  Location: [facility];  Service: Endoscopy;  Laterality: N/A;  Welcome- 2009-08/30/2013   "counting today's, I have 5 stents" (08/30/2013)  . CORONARY ANGIOPLASTY WITH STENT PLACEMENT  05/07/2017  . CORONARY STENT INTERVENTION N/A 05/07/2017   Procedure: CORONARY STENT INTERVENTION;  Surgeon: Charolette Forward, MD;  Location: [place] [facility];  Service: Cardiovascular;  Laterality: N/A;  . CYSTOSCOPY WITH URETHRAL DILATATION N/A 11/26/2012   Procedure: CYSTOSCOPY WITH URETHRAL DILATATION;  Surgeon: Malka So, MD;  Location: [facility];  Service: Urology;  Laterality: N/A;  . LEFT HEART CATH AND CORONARY ANGIOGRAPHY N/A 05/07/2017   Procedure: LEFT HEART CATH AND CORONARY ANGIOGRAPHY;  Surgeon: Charolette Forward, MD;  Location: [place] [facility];  Service: Cardiovascular;  Laterality: N/A;  . LEFT HEART CATHETERIZATION WITH CORONARY ANGIOGRAM N/A 08/30/2013   Procedure: LEFT HEART CATHETERIZATION WITH CORONARY ANGIOGRAM;  Surgeon: Clent Demark, MD;  Location: [place] CATH LAB;  Service: Cardiovascular;  Laterality: N/A;  . PERCUTANEOUS CORONARY STENT INTERVENTION (PCI-S) Right 08/30/2013   Procedure: PERCUTANEOUS CORONARY STENT INTERVENTION (PCI-S);  Surgeon: Clent Demark, MD;  Location: [facility] CATH LAB;  Service: Cardiovascular;  Laterality: Right;       Family History  Problem Relation Age of Onset  . Hypertension Mother   . Diabetes Mother   . Hypertension Father   . Diabetes Brother   . Stroke Daughter   . Arthritis Other        Family History   . Diabetes Other        family History     Social History   Tobacco Use  . Smoking status: Former Smoker    Packs/day: 0.50    Years: 33.00    Pack years: 16.50      Types: Cigarettes    Quit date: 11/29/2002    Years since quitting: 16.7  . Smokeless tobacco: Never Used  Substance Use Topics  . Alcohol use: No  . Drug use: No    Home Medications Prior to Admission medications   Medication Sig Start Date End Date Taking? Authorizing Provider  ACCU-CHEK AVIVA PLUS test strip TEST FOUR TIMES DAILY 03/01/19  Yes Fayrene Helper, MD  acetaminophen (TYLENOL) 500 MG tablet Take 1,000 mg by mouth every 6 (six) hours as needed for moderate pain (left thigh).    Yes [provider]  albuterol (VENTOLIN HFA) 108 (90 Base) MCG/ACT inhaler Inhale 1-2 puffs into the lungs every 6 (six) hours as needed for wheezing or shortness of breath. 07/07/19  Yes Perlie Mayo, NP  allopurinol (ZYLOPRIM) 300 MG tablet Take 1 tablet (300 mg total) by mouth daily. 04/22/19 04/22/20 Yes Fayrene Helper, MD  aspirin EC 81  MG tablet Take 81 mg by mouth daily.   Yes [provider]  Blood Glucose Monitoring Suppl (ACCU-CHEK AVIVA PLUS) w/Device KIT 1 each by Does not apply route 2 (two) times a day. 10/04/18  Yes Cassandria Anger, MD  budesonide-formoterol (SYMBICORT) 80-4.5 MCG/ACT inhaler Inhale 2 puffs into the lungs daily. 07/07/19  Yes Perlie Mayo, NP  clopidogrel (PLAVIX) 75 MG tablet TAKE 1 TABLET (75 MG TOTAL) BY MOUTH DAILY. 04/25/19  Yes Fayrene Helper, MD  ezetimibe (ZETIA) 10 MG tablet Take 1 tablet (10 mg total) by mouth daily. 03/06/18 08/26/19 Yes Fayrene Helper, MD  ferrous sulfate 325 (65 FE) MG tablet Take 1 tablet (325 mg total) by mouth 3 (three) times daily with meals. 05/08/17  Yes Charolette Forward, MD  insulin NPH-regular Human (NOVOLIN 70/30) (70-30) 100 UNIT/ML injection INJECT 45 UNITS SUBCUTANEOUSLY WITH BREAKFAST AND 35 UNITS WITH SUPPER (WHEN GLUCOSE IS ABOVE 90) 06/28/19  Yes Nida, Marella Chimes, MD  isosorbide mononitrate (IMDUR) 30 MG 24 hr tablet Take 30 mg by mouth daily. 06/23/19  Yes [provider]   levETIRAcetam (KEPPRA) 500 MG tablet TAKE 1 TABLET TWICE DAILY 02/08/19  Yes Fayrene Helper, MD  levothyroxine (SYNTHROID) 100 MCG tablet TAKE 1 TABLET (100 MCG TOTAL) BY MOUTH DAILY BEFORE BREAKFAST. 03/01/19  Yes Nida, Marella Chimes, MD  metFORMIN (GLUCOPHAGE) 500 MG tablet TAKE 1 TABLET EVERY DAY WITH BREAKFAST Patient taking differently: Take 500 mg by mouth daily with breakfast.  08/24/19  Yes Nida, Marella Chimes, MD  metoprolol tartrate (LOPRESSOR) 50 MG tablet TAKE 1/2 TABLET TWICE DAILY 04/11/19  Yes Fayrene Helper, MD  OXYGEN Inhale 2 L into the lungs continuous.   Yes [provider]  pantoprazole (PROTONIX) 40 MG tablet TAKE 1 TABLET EVERY DAY BEFORE BREAKFAST Patient taking differently: Take 40 mg by mouth daily. TAKE 1 TABLET EVERY DAY BEFORE BREAKFAST 04/25/19  Yes Fayrene Helper, MD  pravastatin (PRAVACHOL) 40 MG tablet TAKE 2 TABLETS EVERY DAY 05/26/19  Yes Fayrene Helper, MD  senna (SENOKOT) 8.6 MG tablet Take 1-2 tablets by mouth daily as needed for constipation.    Yes [provider]  spironolactone (ALDACTONE) 25 MG tablet TAKE 1 TABLET EVERY DAY 02/08/19  Yes Fayrene Helper, MD  tiotropium (SPIRIVA HANDIHALER) 18 MCG inhalation capsule Place 1 capsule (18 mcg total) into inhaler and inhale daily. Patient taking differently: Place 18 mcg into inhaler and inhale daily as needed (for shortness of breath or wheezing).  05/08/17 08/26/19 Yes Charolette Forward, MD  vitamin B-12 (CYANOCOBALAMIN) 1000 MCG tablet Take 1,000 mcg by mouth daily.   Yes [provider]  Vitamin D, Ergocalciferol, (DRISDOL) 1.25 MG (50000 UT) CAPS capsule TAKE 1 CAPSULE ONCE WEEKLY Patient taking differently: Take 50,000 Units by mouth every 7 (seven) days.  06/17/18  Yes Fayrene Helper, MD  cephALEXin (KEFLEX) 500 MG capsule Take 1 capsule (500 mg total) by mouth 3 (three) times daily for 7 days. 08/26/19 09/02/19  Jacqlyn Larsen, PA-C  nitroGLYCERIN  (NITROSTAT) 0.4 MG SL tablet Place 1 tablet (0.4 mg total) under the tongue every 5 (five) minutes as needed for chest pain. 05/08/17 05/08/18  Charolette Forward, MD    Allergies    Patient has no known allergies.  Review of Systems   Review of Systems  Constitutional: Negative for chills and fever.  HENT: Negative.   Respiratory: Negative for cough and shortness of breath.   Cardiovascular: Negative for chest  pain.  Gastrointestinal: Positive for abdominal distention, abdominal pain and constipation. Negative for diarrhea, nausea and vomiting.  Genitourinary: Negative for dysuria and frequency.  Musculoskeletal: Negative for arthralgias and myalgias.  Skin: Negative for color change and rash.  Neurological: Negative for dizziness, syncope and light-headedness.  All other systems reviewed and are negative.   Physical Exam Updated Vital Signs BP (!) 159/72   Pulse 77   Temp 98.1 F (36.7 C)   Resp 20   Ht '5\' 10"'$  (1.778 m)   SpO2 96%   BMI 37.59 kg/m   Physical Exam Vitals and nursing note reviewed.  Constitutional:      General: He is not in acute distress.    Appearance: He is well-developed. He is obese. He is not diaphoretic.     Comments: Chronically ill-appearing, but in no acute distress  HENT:     Head: Normocephalic and atraumatic.     Mouth/Throat:     Mouth: Mucous membranes are moist.     Pharynx: Oropharynx is clear.  Eyes:     General:        Right eye: No discharge.        Left eye: No discharge.  Cardiovascular:     Rate and Rhythm: Normal rate and regular rhythm.     Heart sounds: Normal heart sounds. No murmur. No friction rub. No gallop.   Pulmonary:     Effort: Pulmonary effort is normal. No respiratory distress.     Breath sounds: Normal breath sounds. No wheezing or rales.     Comments: Respirations equal and unlabored, patient able to speak in full sentences, on home O2, lungs clear to auscultation bilaterally  Abdominal:     General: Bowel  sounds are decreased. There is distension.     Palpations: There is no mass.     Tenderness: There is generalized abdominal tenderness. There is no guarding.     Comments: Abdomen is distended, bowel sounds are present but slightly decreased, mild generalized tenderness noted but no focal guarding or rebound tenderness, no hernias or masses noted  Musculoskeletal:        General: No deformity.     Cervical back: Neck supple.  Skin:    General: Skin is warm and dry.     Capillary Refill: Capillary refill takes less than 2 seconds.  Neurological:     Mental Status: He is alert.     Coordination: Coordination normal.     Comments: Speech is clear, able to follow commands Moves extremities without ataxia, coordination intact  Psychiatric:        Mood and Affect: Mood normal.        Behavior: Behavior normal.     ED Results / Procedures / Treatments   Labs (all labs ordered are listed, but only abnormal results are displayed) Labs Reviewed  COMPREHENSIVE METABOLIC PANEL - Abnormal; Notable for the following components:      Result Value   Glucose, Bld 133 (*)    AST 14 (*)    All other components within normal limits  CBC WITH DIFFERENTIAL/PLATELET - Abnormal; Notable for the following components:   WBC 11.7 (*)    RBC 3.98 (*)    Hemoglobin 11.1 (*)    HCT 36.6 (*)    Eosinophils Absolute 0.6 (*)    All other components within normal limits  URINALYSIS, ROUTINE W REFLEX MICROSCOPIC - Abnormal; Notable for the following components:   Leukocytes,Ua TRACE (*)    Bacteria, UA MANY (*)  All other components within normal limits  URINE CULTURE  LIPASE, BLOOD    EKG None  Radiology CT ABDOMEN PELVIS W CONTRAST  Result Date: 08/26/2019 CLINICAL DATA:  Bowel obstruction, abdominal distension. EXAM: CT ABDOMEN AND PELVIS WITH CONTRAST TECHNIQUE: Multidetector CT imaging of the abdomen and pelvis was performed using the standard protocol following bolus administration of  intravenous contrast. CONTRAST:  18m OMNIPAQUE IOHEXOL 300 MG/ML  SOLN COMPARISON:  11/15/2009 FINDINGS: Lower chest: Basilar bronchiectasis and tree-in-bud opacities at the lung bases bilaterally. No significant septal thickening. No consolidation. No pleural effusion. Calcified coronary artery disease likely with prior percutaneous coronary intervention. Heart is incompletely imaged. No pericardial effusion. Hepatobiliary: No focal hepatic lesion. No ductal dilation. No pericholecystic stranding. Pancreas: Pancreas is normal without focal lesion, ductal dilation or peripancreatic inflammation. Spleen: Spleen will size without focal lesion. Adrenals/Urinary Tract: Adrenal glands are normal. Upper pole cyst arising from the right kidney similar to the prior study. No hydronephrosis. Urinary bladder is normal. Small low-density lesion in the left kidney measures water density also likely a small cyst. Renal enhancement otherwise symmetric. Stomach/Bowel: Stomach mildly distended. Small bowel of normal caliber without acute process. Colon is filled with liquid and partially formed stool. Vascular/Lymphatic: Calcified and noncalcified atherosclerotic plaque in the abdominal aorta. No added [place] Commonin the upper abdomen or in the retroperitoneum. No pelvic lymphadenopathy. Reproductive: Prostate mildly heterogeneous, nonspecific on CT. Other: No free air. No ascites. Musculoskeletal: No acute bone finding or destructive bone process. IMPRESSION: 1. No bowel obstruction. 2. Abundant stool in the colon both liquid and formed. 3. Basilar bronchiectasis and tree-in-bud opacities at the lung bases bilaterally, findings may be related to aspiration or atypical infection with chronicity, correlate with any risk of aspiration. 4. Aortic atherosclerosis. Aortic Atherosclerosis (ICD10-I70.0). Electronically Signed   By: GZetta BillsM.D.   On: 08/26/2019 19:02    Procedures Procedures (including critical care  time)  Medications Ordered in ED Medications  iohexol (OMNIPAQUE) 300 MG/ML solution 100 mL (100 mLs Intravenous Contrast Given 08/26/19 1822)  cephALEXin (KEFLEX) capsule 500 mg (500 mg Oral Given 08/26/19 1956)    ED Course  I have reviewed the triage vital signs and the nursing notes.  Pertinent labs & imaging results that were available during my care of the patient were reviewed by me and considered in my medical decision making (see chart for details).    MDM Rules/Calculators/A&P                      79year old male presenting with abdominal distention associated with belching, he has been able to pass gas but has not been able to pass any stools today.  He has not had any vomiting but has had decreased appetite since this morning.  Concerning for possible obstruction, ileus, constipation.  Will get basic labs and CT abdomen pelvis.  Patient currently denies any discomfort so we will hold off on giving any pain medications.  I have personally ordered, reviewed and interpreted all lab work and imaging.  Labs show slight leukocytosis of 11.7, stable hemoglobin, glucose of 133 but no other significant electrolyte derangements, normal renal and liver function and normal lipase.  Urinalysis with trace leukocytes, 6-10 WBCs and many bacteria, culture sent.  CT shows no evidence of bowel obstruction, abundant stool noted in the colon, both liquid and solid, there are some basilar bronchiectasis noted with tree-in-bud opacities, patient has history of chronic bronchitis is not having new shortness of breath,  cough, fevers, and I feel this is more chronic, patient is not at high risk for aspiration.  Suspect constipation is likely the cause of most of his discomfort.  Will have patient complete a MiraLAX cleanout and then continue with daily MiraLAX regiment I have asked him to use Gas-X as well, make sure he is drinking plenty of fluids, he also intermittently uses senna at home.  We will also  place on Keflex for UTI.  PCP follow-up encouraged, return precautions discussed.  Patient expresses understanding and agreement.  Discharged home in good condition.  Final Clinical Impression(s) / ED Diagnoses Final diagnoses:  Constipation, unspecified constipation type  Abdominal distension  Acute UTI    Rx / DC Orders ED Discharge Orders         Ordered    cephALEXin (KEFLEX) 500 MG capsule  3 times daily     08/26/19 1945           Janet Berlin 08/26/19 2153    Lajean Saver, MD 08/26/19 2355

## 2019-08-27 ENCOUNTER — Other Ambulatory Visit: Payer: Self-pay

## 2019-08-27 ENCOUNTER — Inpatient Hospital Stay (HOSPITAL_COMMUNITY)
Admission: EM | Admit: 2019-08-27 | Discharge: 2019-08-30 | DRG: 389 | Disposition: A | Discharge status: Home / Self Care | Payer: Medicare HMO | Attending: Internal Medicine | Admitting: Internal Medicine

## 2019-08-27 ENCOUNTER — Emergency Department (HOSPITAL_COMMUNITY): Payer: Medicare HMO

## 2019-08-27 ENCOUNTER — Encounter (HOSPITAL_COMMUNITY): Payer: Self-pay | Admitting: Emergency Medicine

## 2019-08-27 DIAGNOSIS — G4733 Obstructive sleep apnea (adult) (pediatric): Secondary | ICD-10-CM | POA: Diagnosis present

## 2019-08-27 DIAGNOSIS — Z794 Long term (current) use of insulin: Secondary | ICD-10-CM

## 2019-08-27 DIAGNOSIS — Z9981 Dependence on supplemental oxygen: Secondary | ICD-10-CM

## 2019-08-27 DIAGNOSIS — Z79890 Hormone replacement therapy (postmenopausal): Secondary | ICD-10-CM

## 2019-08-27 DIAGNOSIS — I10 Essential (primary) hypertension: Secondary | ICD-10-CM | POA: Diagnosis present

## 2019-08-27 DIAGNOSIS — Z6837 Body mass index (BMI) 37.0-37.9, adult: Secondary | ICD-10-CM

## 2019-08-27 DIAGNOSIS — Z7951 Long term (current) use of inhaled steroids: Secondary | ICD-10-CM

## 2019-08-27 DIAGNOSIS — Z87891 Personal history of nicotine dependence: Secondary | ICD-10-CM

## 2019-08-27 DIAGNOSIS — M109 Gout, unspecified: Secondary | ICD-10-CM | POA: Diagnosis present

## 2019-08-27 DIAGNOSIS — Z03818 Encounter for observation for suspected exposure to other biological agents ruled out: Secondary | ICD-10-CM | POA: Diagnosis not present

## 2019-08-27 DIAGNOSIS — R739 Hyperglycemia, unspecified: Secondary | ICD-10-CM

## 2019-08-27 DIAGNOSIS — N390 Urinary tract infection, site not specified: Secondary | ICD-10-CM | POA: Diagnosis present

## 2019-08-27 DIAGNOSIS — Z86010 Personal history of colonic polyps: Secondary | ICD-10-CM

## 2019-08-27 DIAGNOSIS — J449 Chronic obstructive pulmonary disease, unspecified: Secondary | ICD-10-CM | POA: Diagnosis present

## 2019-08-27 DIAGNOSIS — K567 Ileus, unspecified: Secondary | ICD-10-CM | POA: Diagnosis present

## 2019-08-27 DIAGNOSIS — R109 Unspecified abdominal pain: Secondary | ICD-10-CM | POA: Diagnosis not present

## 2019-08-27 DIAGNOSIS — J9611 Chronic respiratory failure with hypoxia: Secondary | ICD-10-CM

## 2019-08-27 DIAGNOSIS — Z955 Presence of coronary angioplasty implant and graft: Secondary | ICD-10-CM

## 2019-08-27 DIAGNOSIS — I129 Hypertensive chronic kidney disease with stage 1 through stage 4 chronic kidney disease, or unspecified chronic kidney disease: Secondary | ICD-10-CM | POA: Diagnosis present

## 2019-08-27 DIAGNOSIS — I2510 Atherosclerotic heart disease of native coronary artery without angina pectoris: Secondary | ICD-10-CM | POA: Diagnosis present

## 2019-08-27 DIAGNOSIS — I252 Old myocardial infarction: Secondary | ICD-10-CM

## 2019-08-27 DIAGNOSIS — Z79899 Other long term (current) drug therapy: Secondary | ICD-10-CM

## 2019-08-27 DIAGNOSIS — Z7982 Long term (current) use of aspirin: Secondary | ICD-10-CM

## 2019-08-27 DIAGNOSIS — K56609 Unspecified intestinal obstruction, unspecified as to partial versus complete obstruction: Principal | ICD-10-CM | POA: Diagnosis present

## 2019-08-27 DIAGNOSIS — G40909 Epilepsy, unspecified, not intractable, without status epilepticus: Secondary | ICD-10-CM | POA: Diagnosis present

## 2019-08-27 DIAGNOSIS — I69351 Hemiplegia and hemiparesis following cerebral infarction affecting right dominant side: Secondary | ICD-10-CM

## 2019-08-27 DIAGNOSIS — E039 Hypothyroidism, unspecified: Secondary | ICD-10-CM | POA: Diagnosis present

## 2019-08-27 DIAGNOSIS — Z20822 Contact with and (suspected) exposure to covid-19: Secondary | ICD-10-CM | POA: Diagnosis present

## 2019-08-27 DIAGNOSIS — K219 Gastro-esophageal reflux disease without esophagitis: Secondary | ICD-10-CM | POA: Diagnosis present

## 2019-08-27 DIAGNOSIS — E669 Obesity, unspecified: Secondary | ICD-10-CM | POA: Diagnosis present

## 2019-08-27 DIAGNOSIS — E559 Vitamin D deficiency, unspecified: Secondary | ICD-10-CM | POA: Diagnosis present

## 2019-08-27 DIAGNOSIS — E1165 Type 2 diabetes mellitus with hyperglycemia: Secondary | ICD-10-CM | POA: Diagnosis present

## 2019-08-27 DIAGNOSIS — E538 Deficiency of other specified B group vitamins: Secondary | ICD-10-CM | POA: Diagnosis present

## 2019-08-27 DIAGNOSIS — E782 Mixed hyperlipidemia: Secondary | ICD-10-CM | POA: Diagnosis present

## 2019-08-27 DIAGNOSIS — K429 Umbilical hernia without obstruction or gangrene: Secondary | ICD-10-CM | POA: Diagnosis present

## 2019-08-27 DIAGNOSIS — R1110 Vomiting, unspecified: Secondary | ICD-10-CM | POA: Diagnosis not present

## 2019-08-27 DIAGNOSIS — E1122 Type 2 diabetes mellitus with diabetic chronic kidney disease: Secondary | ICD-10-CM | POA: Diagnosis present

## 2019-08-27 DIAGNOSIS — N182 Chronic kidney disease, stage 2 (mild): Secondary | ICD-10-CM | POA: Diagnosis present

## 2019-08-27 DIAGNOSIS — Z7902 Long term (current) use of antithrombotics/antiplatelets: Secondary | ICD-10-CM

## 2019-08-27 DIAGNOSIS — R112 Nausea with vomiting, unspecified: Secondary | ICD-10-CM | POA: Diagnosis present

## 2019-08-27 LAB — CBC WITH DIFFERENTIAL/PLATELET
Abs Immature Granulocytes: 0.06 10*3/uL (ref 0.00–0.07)
Basophils Absolute: 0.1 10*3/uL (ref 0.0–0.1)
Basophils Relative: 1 %
Eosinophils Absolute: 0.5 10*3/uL (ref 0.0–0.5)
Eosinophils Relative: 3 %
HCT: 39.7 % (ref 39.0–52.0)
Hemoglobin: 12.3 g/dL — ABNORMAL LOW (ref 13.0–17.0)
Immature Granulocytes: 0 %
Lymphocytes Relative: 10 %
Lymphs Abs: 1.6 10*3/uL (ref 0.7–4.0)
MCH: 28 pg (ref 26.0–34.0)
MCHC: 31 g/dL (ref 30.0–36.0)
MCV: 90.4 fL (ref 80.0–100.0)
Monocytes Absolute: 0.8 10*3/uL (ref 0.1–1.0)
Monocytes Relative: 5 %
Neutro Abs: 12.3 10*3/uL — ABNORMAL HIGH (ref 1.7–7.7)
Neutrophils Relative %: 81 %
Platelets: 315 10*3/uL (ref 150–400)
RBC: 4.39 MIL/uL (ref 4.22–5.81)
RDW: 15.3 % (ref 11.5–15.5)
WBC: 15.3 10*3/uL — ABNORMAL HIGH (ref 4.0–10.5)
nRBC: 0 % (ref 0.0–0.2)

## 2019-08-27 LAB — COMPREHENSIVE METABOLIC PANEL
ALT: 12 U/L (ref 0–44)
AST: 12 U/L — ABNORMAL LOW (ref 15–41)
Albumin: 3.7 g/dL (ref 3.5–5.0)
Alkaline Phosphatase: 65 U/L (ref 38–126)
Anion gap: 11 (ref 5–15)
BUN: 16 mg/dL (ref 8–23)
CO2: 24 mmol/L (ref 22–32)
Calcium: 9.4 mg/dL (ref 8.9–10.3)
Chloride: 101 mmol/L (ref 98–111)
Creatinine, Ser: 1.09 mg/dL (ref 0.61–1.24)
GFR calc Af Amer: >60 mL/min (ref >60)
GFR calc non Af Amer: >60 mL/min (ref >60)
Glucose, Bld: 165 mg/dL — ABNORMAL HIGH (ref 70–99)
Potassium: 4.5 mmol/L (ref 3.5–5.1)
Sodium: 136 mmol/L (ref 135–145)
Total Bilirubin: 0.4 mg/dL (ref 0.3–1.2)
Total Protein: 7.6 g/dL (ref 6.5–8.1)

## 2019-08-27 LAB — URINALYSIS, ROUTINE W REFLEX MICROSCOPIC
Bilirubin Urine: NEGATIVE
Glucose, UA: NEGATIVE mg/dL
Ketones, ur: NEGATIVE mg/dL
Nitrite: NEGATIVE
Protein, ur: 100 mg/dL — AB
Specific Gravity, Urine: 1.023 (ref 1.005–1.030)
pH: 5 (ref 5.0–8.0)

## 2019-08-27 LAB — LIPASE, BLOOD: Lipase: 166 U/L — ABNORMAL HIGH (ref 11–51)

## 2019-08-27 MED ORDER — IOHEXOL 300 MG/ML  SOLN
100.0000 mL | Freq: Once | INTRAMUSCULAR | Status: AC | PRN
  Administered 2019-08-27: 100 mL via INTRAVENOUS

## 2019-08-27 MED ORDER — SODIUM CHLORIDE 0.9 % IV BOLUS
500.0000 mL | Freq: Once | INTRAVENOUS | Status: AC
  Administered 2019-08-27: 500 mL via INTRAVENOUS

## 2019-08-27 NOTE — ED Provider Notes (Signed)
Emergency Department Provider Note   I have reviewed the triage vital signs and the nursing notes.   HISTORY  Chief Complaint Abdominal Pain   HPI [name] is a 79 y.o. male with past medical history reviewed below returns to the emergency department today with continued generalized abdominal pain, distention, vomiting.  He was seen in the emergency department last night with lab work and CT imaging at that time.  He has a very large stool burden without clinical impaction.  No bowel obstruction.  He was discharged with plan for MiraLAX clean out.  He went home and took 8 capfuls of MiraLAX and Gatorade and drink that with no bowel movements.  He has had occasional passage of flatus but no bowel movement since late yesterday.  He had approximately 5 episodes of nonbloody emesis today.  No fevers, chest pain, or shortness of breath.  He returns today with continued symptoms.  No radiation of symptoms or other modifying factors.  Past Medical History:  Diagnosis Date  . Abnormality of gait 05/01/2015  . Anemia 04/19/2015  . Annual physical exam 12/11/2013  . Arthritis    "left leg; knees" (05/07/2017)  . Arthritis of knee   . CAD (coronary artery disease)   . Choking 07/13/2015  . Chronic bronchitis ([place])    "get it q yr"  . Chronic lower back pain    "since I was a teen" (05/07/2017)  . COPD (chronic obstructive pulmonary disease) ([place])   . CVA (cerebral vascular accident) ([place]) 05/2015   R sided weakness (05/07/2017)  . GERD (gastroesophageal reflux disease)   . Gout   . History of colonic polyps 04/03/2017   Added automatically from request for surgery [id number]  . Hyperlipidemia   . Hypertension   . Myocardial infarction ([place]) 1999  . Obesity   . OSA on CPAP   . Pneumonia    "a few times; last time was ~ 06/2013" (05/07/2017)  . Psoriasis   . Tussive syncope 05/01/2015  . Type II diabetes mellitus ([place])   . Vitamin B12 deficiency 05/01/2015    Patient Active  Problem List   Diagnosis Date Noted  . SBO (small bowel obstruction) ([place]) 08/28/2019  . Nausea and vomiting 08/28/2019  . Hyperglycemia 08/28/2019  . Accelerated hypertension 08/28/2019  . Chronic respiratory failure with hypoxia ([place]) 08/28/2019  . At moderate risk for fall 03/02/2019  . Longitudinal overcurvature of nails 07/14/2018  . Uncontrolled type 2 diabetes mellitus with hyperglycemia ([place]) 09/07/2017  . New-onset angina ([place]) 05/07/2017  . CKD (chronic kidney disease) stage 2, GFR 60-89 ml/min 06/04/2015  . Hypothyroidism 06/04/2015  . Back pain 06/01/2015  . Partial seizure ([place]) 05/12/2015  . Right hemiparesis ([place]) 05/12/2015  . Stroke ([place]) 05/10/2015  . GERD (gastroesophageal reflux disease) 05/10/2015  . CVA (cerebral infarction) 05/10/2015  . Vitamin B12 deficiency 05/01/2015  . Abnormality of gait 05/01/2015  . Tussive syncope 05/01/2015  . Recurrent falls 04/29/2015  . Dysphagia 04/26/2015  . Carotid bruit 12/08/2013  . Type 2 diabetes mellitus with diabetic nephropathy, with Markisha Meding-term current use of insulin ([place]) 05/22/2013  . COPD (chronic obstructive pulmonary disease) ([place]) 11/22/2012  . Hypoxia 08/30/2012  . Vitamin D deficiency 12/23/2011  . PSORIASIS 07/18/2010  . Gout 01/13/2010  . Mixed hyperlipidemia 10/05/2007  . Morbid obesity ([place]) 10/05/2007  . Essential hypertension 10/05/2007  . Coronary atherosclerosis 10/05/2007  . GERD 10/05/2007  . Arthritis of knee, right 10/05/2007    Past Surgical History:  Procedure Laterality Date  . APPENDECTOMY    . CATARACT EXTRACTION W/PHACO Left 10/07/2016   Procedure: CATARACT EXTRACTION PHACO AND INTRAOCULAR LENS PLACEMENT (IOC);  Surgeon: Rutherford Guys, MD;  Location: [facility];  Service: Ophthalmology;  Laterality: Left;  CDE: 13.49  . CATARACT EXTRACTION W/PHACO Right 10/28/2016   Procedure: CATARACT EXTRACTION PHACO AND INTRAOCULAR LENS PLACEMENT (IOC);  Surgeon: Rutherford Guys, MD;  Location: [facility];   Service: Ophthalmology;  Laterality: Right;  CDE: 16.71  . COLONOSCOPY  03/17/2012   Procedure: COLONOSCOPY;  Surgeon: Rogene Houston, MD;  Location: [facility];  Service: Endoscopy;  Laterality: N/A;  830  . COLONOSCOPY N/A 06/25/2017   Procedure: COLONOSCOPY;  Surgeon: Rogene Houston, MD;  [facility];  Service: Endoscopy;  Laterality: N/A;  [place]- 2009-08/30/2013   "counting today's, I have 5 stents" (08/30/2013)  . CORONARY ANGIOPLASTY WITH STENT PLACEMENT  05/07/2017  . CORONARY STENT INTERVENTION N/A 05/07/2017   Procedure: CORONARY STENT INTERVENTION;  Surgeon: Charolette Forward, MD;  Location: [facility];  Service: Cardiovascular;  Laterality: N/A;  . CYSTOSCOPY WITH URETHRAL DILATATION N/A 11/26/2012   Procedure: CYSTOSCOPY WITH URETHRAL DILATATION;  Surgeon: Malka So, MD;  Location: [facility];  Service: Urology;  Laterality: N/A;  . LEFT HEART CATH AND CORONARY ANGIOGRAPHY N/A 05/07/2017   Procedure: LEFT HEART CATH AND CORONARY ANGIOGRAPHY;  Surgeon: Charolette Forward, MD;  Location: [facility];  Service: Cardiovascular;  Laterality: N/A;  . LEFT HEART CATHETERIZATION WITH CORONARY ANGIOGRAM N/A 08/30/2013   Procedure: LEFT HEART CATHETERIZATION WITH CORONARY ANGIOGRAM;  Surgeon: Clent Demark, MD;  Location: [facility];  Service: Cardiovascular;  Laterality: N/A;  . PERCUTANEOUS CORONARY STENT INTERVENTION (PCI-S) Right 08/30/2013   Procedure: PERCUTANEOUS CORONARY STENT INTERVENTION (PCI-S);  Surgeon: Clent Demark, MD;  Location[facility];  Service: Cardiovascular;  Laterality: Right;    Allergies Patient has no known allergies.  Family History  Problem Relation Age of Onset  . Hypertension Mother   . Diabetes Mother   . Hypertension Father   . Diabetes Brother   . Stroke Daughter   . Arthritis Other        Family History   . Diabetes Other        family History     Social History Social  History   Tobacco Use  . Smoking status: Former Smoker    Packs/day: 0.50    Years: 33.00    Pack years: 16.50    Types: Cigarettes    Quit date: 11/29/2002    Years since quitting: 16.7  . Smokeless tobacco: Never Used  Substance Use Topics  . Alcohol use: No  . Drug use: No    Review of Systems  Constitutional: No fever/chills Eyes: No visual changes. ENT: No sore throat. Cardiovascular: Denies chest pain. Respiratory: Denies shortness of breath. Gastrointestinal: Positive generalized abdominal pain. Positive nausea and vomiting.  No diarrhea. Positive constipation. Genitourinary: Negative for dysuria. Musculoskeletal: Negative for back pain. Skin: Negative for rash. Neurological: Negative for headaches, focal weakness or numbness.  10-point ROS otherwise negative.  ____________________________________________   PHYSICAL EXAM:  VITAL SIGNS: ED Triage Vitals [08/27/19 1936]  Enc Vitals Group     BP (!) 181/96     Pulse Rate (!) 108     Resp 17     Temp 98.4 F (36.9 C)     Temp Source Oral  SpO2 98 %     Weight 262 lb (118.8 kg)     Height 5\' 10"  (1.778 m)   Constitutional: Alert and oriented. Well appearing and in no acute distress. Eyes: Conjunctivae are normal. Head: Atraumatic. Nose: No congestion/rhinnorhea. Mouth/Throat: Mucous membranes are moist.  Neck: No stridor.   Cardiovascular: Tachycardia. Good peripheral circulation. Grossly normal heart sounds.   Respiratory: Normal respiratory effort.  No retractions. Lungs CTAB. Gastrointestinal: Soft and nontender. No distention.  Musculoskeletal: No gross deformities of extremities. Neurologic:  Normal speech and language. Skin:  Skin is warm, dry and intact. No rash noted.   ____________________________________________   LABS (all labs ordered are listed, but only abnormal results are displayed)  Labs Reviewed  COMPREHENSIVE METABOLIC PANEL - Abnormal; Notable for the following components:       Result Value   Glucose, Bld 165 (*)    AST 12 (*)    All other components within normal limits  LIPASE, BLOOD - Abnormal; Notable for the following components:   Lipase 166 (*)    All other components within normal limits  CBC WITH DIFFERENTIAL/PLATELET - Abnormal; Notable for the following components:   WBC 15.3 (*)    Hemoglobin 12.3 (*)    Neutro Abs 12.3 (*)    All other components within normal limits  URINALYSIS, ROUTINE W REFLEX MICROSCOPIC - Abnormal; Notable for the following components:   APPearance HAZY (*)    Hgb urine dipstick SMALL (*)    Protein, ur 100 (*)    Leukocytes,Ua SMALL (*)    Bacteria, UA RARE (*)    All other components within normal limits  COMPREHENSIVE METABOLIC PANEL - Abnormal; Notable for the following components:   Glucose, Bld 131 (*)    Albumin 3.3 (*)    AST 11 (*)    All other components within normal limits  CBC - Abnormal; Notable for the following components:   WBC 13.7 (*)    RBC 4.19 (*)    Hemoglobin 11.6 (*)    HCT 38.8 (*)    MCHC 29.9 (*)    All other components within normal limits  GLUCOSE, CAPILLARY - Abnormal; Notable for the following components:   Glucose-Capillary 117 (*)    All other components within normal limits  GLUCOSE, CAPILLARY - Abnormal; Notable for the following components:   Glucose-Capillary 141 (*)    All other components within normal limits  GLUCOSE, CAPILLARY - Abnormal; Notable for the following components:   Glucose-Capillary 125 (*)    All other components within normal limits  SARS CORONAVIRUS 2 (TAT 6-24 HRS)   ____________________________________________  RADIOLOGY  CT ABDOMEN PELVIS W CONTRAST  Result Date: 08/28/2019 CLINICAL DATA:  Abdominal pain and vomiting. EXAM: CT ABDOMEN AND PELVIS WITH CONTRAST TECHNIQUE: Multidetector CT imaging of the abdomen and pelvis was performed using the standard protocol following bolus administration of intravenous contrast. CONTRAST:  154mL OMNIPAQUE  IOHEXOL 300 MG/ML  SOLN COMPARISON:  August 26, 2019 FINDINGS: Lower chest: Mild linear scarring and/or atelectasis is seen within the bilateral lung bases. Hepatobiliary: No focal liver abnormality is seen. No gallstones, gallbladder wall thickening, or biliary dilatation. Pancreas: Unremarkable. No pancreatic ductal dilatation or surrounding inflammatory changes. Spleen: Normal in size without focal abnormality. Adrenals/Urinary Tract: Adrenal glands are unremarkable. Kidneys are normal in size, without renal calculi or hydronephrosis. A 5.4 cm x 4.7 cm simple cyst is seen within the posteromedial aspect of the mid right kidney. A 1.5 cm cyst is seen within the  posterolateral aspect of the mid left kidney. Bladder is unremarkable. Stomach/Bowel: Stomach is within normal limits. The appendix is not clearly identified. Multiple dilated small bowel loops are seen throughout the abdomen and pelvis. A clear transition zone is not identified. Vascular/Lymphatic: Marked severity aortic calcification. No enlarged abdominal or pelvic lymph nodes. Reproductive: Prostate is unremarkable. Other: There is a small amount of perihepatic fluid. A small amount of pelvic free fluid is also seen. Musculoskeletal: Multilevel degenerative changes seen throughout the lumbar spine. IMPRESSION: 1. Multiple dilated small bowel loops throughout the abdomen and pelvis consistent with a distal small bowel obstruction. 2. Small amount of ascites. 3. Bilateral simple renal cysts. Aortic Atherosclerosis (ICD10-I70.0). Electronically Signed   By: Virgina Norfolk M.D.   On: 08/28/2019 00:05   DG Abdomen Acute W/Chest  Result Date: 08/27/2019 CLINICAL DATA:  Abdominal pain. EXAM: DG ABDOMEN ACUTE W/ 1V CHEST COMPARISON:  May 18, 2015 FINDINGS: Multiple dilated small bowel loops are seen overlying the mid abdomen. No radiopaque calculi or other significant radiographic abnormality is seen. Heart size and mediastinal contours are within  normal limits. Both lungs are clear. IMPRESSION: Findings consistent with small-bowel obstruction. Electronically Signed   By: Virgina Norfolk M.D.   On: 08/27/2019 23:01    ____________________________________________   PROCEDURES  Procedure(s) performed:   Procedures  None ____________________________________________   INITIAL IMPRESSION / ASSESSMENT AND PLAN / ED COURSE  Pertinent labs & imaging results that were available during my care of the patient were reviewed by me and considered in my medical decision making (see chart for details).   Patient returns to the emergency department today with continued abdominal pain.  CT imaging reviewed from yesterday showing very large stool burden without bowel obstruction.  No clinical symptoms to suspect fecal impaction.  Plan for labs, IV fluids, acute abdomen series.   Plain film suggesting interval development of SBO. Will repeat CT with worsening leukocytosis and elevated lipase for better evaluation. No NG tube for now with no vomiting and symptoms well controlled here. Updated patient and wife at bedside.   Care transferred to Dr. Christy Gentles.  ____________________________________________  FINAL CLINICAL IMPRESSION(S) / ED DIAGNOSES  Final diagnoses:  Small bowel obstruction ([place])     MEDICATIONS GIVEN DURING THIS VISIT:  Medications  heparin injection 5,000 Units (5,000 Units Subcutaneous Given 08/28/19 0613)  acetaminophen (TYLENOL) tablet 650 mg (has no administration in time range)    Or  acetaminophen (TYLENOL) suppository 650 mg (has no administration in time range)  ondansetron (ZOFRAN) tablet 4 mg (has no administration in time range)    Or  ondansetron (ZOFRAN) injection 4 mg (has no administration in time range)  insulin aspart (novoLOG) injection 0-15 Units (2 Units Subcutaneous Given 08/28/19 0807)  insulin aspart (novoLOG) injection 0-5 Units (has no administration in time range)  nitroGLYCERIN (NITROSTAT) SL  tablet 0.4 mg (has no administration in time range)  albuterol (VENTOLIN HFA) 108 (90 Base) MCG/ACT inhaler 1-2 puff (has no administration in time range)  0.9 %  sodium chloride infusion ( Intravenous New Bag/Given 08/28/19 0413)  hydrALAZINE (APRESOLINE) injection 10 mg (has no administration in time range)  metoprolol tartrate (LOPRESSOR) injection 2.5 mg (has no administration in time range)  aspirin suppository 300 mg (300 mg Rectal Given 08/28/19 1039)  levETIRAcetam (KEPPRA) IVPB 500 mg/100 mL premix (500 mg Intravenous New Bag/Given 08/28/19 1045)  levothyroxine (SYNTHROID, LEVOTHROID) injection 50 mcg (50 mcg Intravenous Given 08/28/19 1039)  pantoprazole (PROTONIX) injection 40 mg (40 mg Intravenous  Given 08/28/19 1039)  ipratropium-albuterol (DUONEB) 0.5-2.5 (3) MG/3ML nebulizer solution 3 mL (has no administration in time range)  budesonide (PULMICORT) nebulizer solution 0.5 mg (has no administration in time range)  HYDROmorphone (DILAUDID) injection 0.5 mg (has no administration in time range)  sodium chloride 0.9 % bolus 500 mL (0 mLs Intravenous Stopped 08/27/19 2320)  iohexol (OMNIPAQUE) 300 MG/ML solution 100 mL (100 mLs Intravenous Contrast Given 08/27/19 2342)    Note:  This document was prepared using Dragon voice recognition software and may include unintentional dictation errors.  Nanda Quinton, MD, [facility]    Tracey Stewart, [facility], MD 08/28/19 [phone]

## 2019-08-27 NOTE — ED Triage Notes (Signed)
Pt c/o abdominal pain with emesis 5xs today. Pt was seen for same yesterday.

## 2019-08-27 NOTE — ED Notes (Signed)
Pl[place] 2LPM via [place] pt states he normally wears O2 at home.

## 2019-08-28 DIAGNOSIS — I2510 Atherosclerotic heart disease of native coronary artery without angina pectoris: Secondary | ICD-10-CM | POA: Diagnosis present

## 2019-08-28 DIAGNOSIS — I129 Hypertensive chronic kidney disease with stage 1 through stage 4 chronic kidney disease, or unspecified chronic kidney disease: Secondary | ICD-10-CM | POA: Diagnosis not present

## 2019-08-28 DIAGNOSIS — N390 Urinary tract infection, site not specified: Secondary | ICD-10-CM | POA: Diagnosis not present

## 2019-08-28 DIAGNOSIS — G40909 Epilepsy, unspecified, not intractable, without status epilepticus: Secondary | ICD-10-CM | POA: Diagnosis present

## 2019-08-28 DIAGNOSIS — I10 Essential (primary) hypertension: Secondary | ICD-10-CM | POA: Diagnosis present

## 2019-08-28 DIAGNOSIS — J9611 Chronic respiratory failure with hypoxia: Secondary | ICD-10-CM | POA: Diagnosis not present

## 2019-08-28 DIAGNOSIS — R739 Hyperglycemia, unspecified: Secondary | ICD-10-CM

## 2019-08-28 DIAGNOSIS — E559 Vitamin D deficiency, unspecified: Secondary | ICD-10-CM | POA: Diagnosis present

## 2019-08-28 DIAGNOSIS — I69351 Hemiplegia and hemiparesis following cerebral infarction affecting right dominant side: Secondary | ICD-10-CM | POA: Diagnosis not present

## 2019-08-28 DIAGNOSIS — K429 Umbilical hernia without obstruction or gangrene: Secondary | ICD-10-CM | POA: Diagnosis present

## 2019-08-28 DIAGNOSIS — N182 Chronic kidney disease, stage 2 (mild): Secondary | ICD-10-CM

## 2019-08-28 DIAGNOSIS — G4733 Obstructive sleep apnea (adult) (pediatric): Secondary | ICD-10-CM | POA: Diagnosis present

## 2019-08-28 DIAGNOSIS — E039 Hypothyroidism, unspecified: Secondary | ICD-10-CM | POA: Diagnosis present

## 2019-08-28 DIAGNOSIS — Z9981 Dependence on supplemental oxygen: Secondary | ICD-10-CM | POA: Diagnosis not present

## 2019-08-28 DIAGNOSIS — E1122 Type 2 diabetes mellitus with diabetic chronic kidney disease: Secondary | ICD-10-CM | POA: Diagnosis not present

## 2019-08-28 DIAGNOSIS — M109 Gout, unspecified: Secondary | ICD-10-CM | POA: Diagnosis present

## 2019-08-28 DIAGNOSIS — E1165 Type 2 diabetes mellitus with hyperglycemia: Secondary | ICD-10-CM | POA: Diagnosis present

## 2019-08-28 DIAGNOSIS — K56609 Unspecified intestinal obstruction, unspecified as to partial versus complete obstruction: Secondary | ICD-10-CM | POA: Diagnosis not present

## 2019-08-28 DIAGNOSIS — E538 Deficiency of other specified B group vitamins: Secondary | ICD-10-CM | POA: Diagnosis present

## 2019-08-28 DIAGNOSIS — E782 Mixed hyperlipidemia: Secondary | ICD-10-CM | POA: Diagnosis present

## 2019-08-28 DIAGNOSIS — R109 Unspecified abdominal pain: Secondary | ICD-10-CM | POA: Diagnosis not present

## 2019-08-28 DIAGNOSIS — R112 Nausea with vomiting, unspecified: Secondary | ICD-10-CM | POA: Diagnosis present

## 2019-08-28 DIAGNOSIS — K219 Gastro-esophageal reflux disease without esophagitis: Secondary | ICD-10-CM | POA: Diagnosis present

## 2019-08-28 DIAGNOSIS — K567 Ileus, unspecified: Secondary | ICD-10-CM | POA: Diagnosis not present

## 2019-08-28 DIAGNOSIS — E669 Obesity, unspecified: Secondary | ICD-10-CM | POA: Diagnosis present

## 2019-08-28 DIAGNOSIS — I252 Old myocardial infarction: Secondary | ICD-10-CM | POA: Diagnosis not present

## 2019-08-28 DIAGNOSIS — Z20822 Contact with and (suspected) exposure to covid-19: Secondary | ICD-10-CM | POA: Diagnosis not present

## 2019-08-28 DIAGNOSIS — J449 Chronic obstructive pulmonary disease, unspecified: Secondary | ICD-10-CM | POA: Diagnosis present

## 2019-08-28 LAB — COMPREHENSIVE METABOLIC PANEL
ALT: 10 U/L (ref 0–44)
AST: 11 U/L — ABNORMAL LOW (ref 15–41)
Albumin: 3.3 g/dL — ABNORMAL LOW (ref 3.5–5.0)
Alkaline Phosphatase: 57 U/L (ref 38–126)
Anion gap: 10 (ref 5–15)
BUN: 16 mg/dL (ref 8–23)
CO2: 26 mmol/L (ref 22–32)
Calcium: 8.9 mg/dL (ref 8.9–10.3)
Chloride: 101 mmol/L (ref 98–111)
Creatinine, Ser: 1.03 mg/dL (ref 0.61–1.24)
GFR calc Af Amer: >60 mL/min (ref >60)
GFR calc non Af Amer: >60 mL/min (ref >60)
Glucose, Bld: 131 mg/dL — ABNORMAL HIGH (ref 70–99)
Potassium: 4.5 mmol/L (ref 3.5–5.1)
Sodium: 137 mmol/L (ref 135–145)
Total Bilirubin: 0.5 mg/dL (ref 0.3–1.2)
Total Protein: 6.8 g/dL (ref 6.5–8.1)

## 2019-08-28 LAB — CBC
HCT: 38.8 % — ABNORMAL LOW (ref 39.0–52.0)
Hemoglobin: 11.6 g/dL — ABNORMAL LOW (ref 13.0–17.0)
MCH: 27.7 pg (ref 26.0–34.0)
MCHC: 29.9 g/dL — ABNORMAL LOW (ref 30.0–36.0)
MCV: 92.6 fL (ref 80.0–100.0)
Platelets: 288 10*3/uL (ref 150–400)
RBC: 4.19 MIL/uL — ABNORMAL LOW (ref 4.22–5.81)
RDW: 15.4 % (ref 11.5–15.5)
WBC: 13.7 10*3/uL — ABNORMAL HIGH (ref 4.0–10.5)
nRBC: 0 % (ref 0.0–0.2)

## 2019-08-28 LAB — GLUCOSE, CAPILLARY
Glucose-Capillary: 117 mg/dL — ABNORMAL HIGH (ref 70–99)
Glucose-Capillary: 141 mg/dL — ABNORMAL HIGH (ref 70–99)
Glucose-Capillary: 125 mg/dL — ABNORMAL HIGH (ref 70–99)
Glucose-Capillary: 107 mg/dL — ABNORMAL HIGH (ref 70–99)
Glucose-Capillary: 121 mg/dL — ABNORMAL HIGH (ref 70–99)

## 2019-08-28 LAB — URINE CULTURE: Culture: >=100000 — AB

## 2019-08-28 LAB — SARS CORONAVIRUS 2 (TAT 6-24 HRS): SARS Coronavirus 2: NEGATIVE

## 2019-08-28 MED ORDER — HYDROMORPHONE HCL 1 MG/ML IJ SOLN: 0.5000 mg | INTRAMUSCULAR | Status: DC | PRN

## 2019-08-28 MED ORDER — NITROGLYCERIN 0.4 MG SL SUBL: 0.4000 mg | SUBLINGUAL_TABLET | SUBLINGUAL | Status: DC | PRN

## 2019-08-28 MED ORDER — ASPIRIN 300 MG RE SUPP
300.0000 mg | Freq: Every day | RECTAL | Status: DC
  Administered 2019-08-28 – 2019-08-30 (×3): 300 mg via RECTAL
  Filled 2019-08-28 (×3): qty 1

## 2019-08-28 MED ORDER — MOMETASONE FURO-FORMOTEROL FUM 100-5 MCG/ACT IN AERO
2.0000 | INHALATION_SPRAY | Freq: Two times a day (BID) | RESPIRATORY_TRACT | Status: DC
  Administered 2019-08-28: 2 via RESPIRATORY_TRACT
  Filled 2019-08-28: qty 8.8

## 2019-08-28 MED ORDER — HYDRALAZINE HCL 20 MG/ML IJ SOLN: 10.0000 mg | Freq: Four times a day (QID) | INTRAMUSCULAR | Status: DC | PRN

## 2019-08-28 MED ORDER — ONDANSETRON HCL 4 MG/2ML IJ SOLN: 4.0000 mg | Freq: Four times a day (QID) | INTRAMUSCULAR | Status: DC | PRN

## 2019-08-28 MED ORDER — ONDANSETRON HCL 4 MG/2ML IJ SOLN
4.0000 mg | Freq: Four times a day (QID) | INTRAMUSCULAR | Status: DC | PRN
  Administered 2019-08-28: 4 mg via INTRAVENOUS
  Filled 2019-08-28: qty 2

## 2019-08-28 MED ORDER — ACETAMINOPHEN 325 MG PO TABS: 650.0000 mg | ORAL_TABLET | Freq: Four times a day (QID) | ORAL | Status: DC | PRN

## 2019-08-28 MED ORDER — MORPHINE SULFATE (PF) 2 MG/ML IV SOLN
2.0000 mg | INTRAVENOUS | Status: DC | PRN
  Administered 2019-08-28: 2 mg via INTRAVENOUS
  Filled 2019-08-28: qty 1

## 2019-08-28 MED ORDER — INSULIN ASPART 100 UNIT/ML ~~LOC~~ SOLN: 0.0000 [IU] | Freq: Every day | SUBCUTANEOUS | Status: DC

## 2019-08-28 MED ORDER — FENTANYL CITRATE (PF) 100 MCG/2ML IJ SOLN
100.0000 ug | INTRAMUSCULAR | Status: DC | PRN
  Administered 2019-08-28: 100 ug via INTRAVENOUS
  Filled 2019-08-28: qty 2

## 2019-08-28 MED ORDER — BUDESONIDE 0.5 MG/2ML IN SUSP
0.5000 mg | Freq: Two times a day (BID) | RESPIRATORY_TRACT | Status: DC
  Administered 2019-08-28 – 2019-08-30 (×4): 0.5 mg via RESPIRATORY_TRACT
  Filled 2019-08-28 (×4): qty 2

## 2019-08-28 MED ORDER — LEVETIRACETAM IN NACL 500 MG/100ML IV SOLN
500.0000 mg | Freq: Two times a day (BID) | INTRAVENOUS | Status: DC
  Administered 2019-08-28 – 2019-08-30 (×5): 500 mg via INTRAVENOUS
  Filled 2019-08-28 (×5): qty 100

## 2019-08-28 MED ORDER — HYDRALAZINE HCL 20 MG/ML IJ SOLN: 10.0000 mg | Freq: Three times a day (TID) | INTRAMUSCULAR | Status: DC | PRN

## 2019-08-28 MED ORDER — IPRATROPIUM-ALBUTEROL 0.5-2.5 (3) MG/3ML IN SOLN
3.0000 mL | Freq: Four times a day (QID) | RESPIRATORY_TRACT | Status: DC
  Administered 2019-08-28 (×2): 3 mL via RESPIRATORY_TRACT
  Filled 2019-08-28 (×2): qty 3

## 2019-08-28 MED ORDER — LEVOTHYROXINE SODIUM 100 MCG/5ML IV SOLN
50.0000 ug | Freq: Every day | INTRAVENOUS | Status: DC
  Administered 2019-08-28 – 2019-08-30 (×3): 50 ug via INTRAVENOUS
  Filled 2019-08-28 (×3): qty 5

## 2019-08-28 MED ORDER — INSULIN ASPART 100 UNIT/ML ~~LOC~~ SOLN
0.0000 [IU] | Freq: Three times a day (TID) | SUBCUTANEOUS | Status: DC
  Administered 2019-08-28 (×2): 2 [IU] via SUBCUTANEOUS
  Administered 2019-08-30: 3 [IU] via SUBCUTANEOUS

## 2019-08-28 MED ORDER — METOPROLOL TARTRATE 5 MG/5ML IV SOLN
2.5000 mg | Freq: Four times a day (QID) | INTRAVENOUS | Status: DC
  Administered 2019-08-28 – 2019-08-30 (×9): 2.5 mg via INTRAVENOUS
  Filled 2019-08-28 (×9): qty 5

## 2019-08-28 MED ORDER — ACETAMINOPHEN 650 MG RE SUPP: 650.0000 mg | Freq: Four times a day (QID) | RECTAL | Status: DC | PRN

## 2019-08-28 MED ORDER — IPRATROPIUM-ALBUTEROL 0.5-2.5 (3) MG/3ML IN SOLN
3.0000 mL | Freq: Four times a day (QID) | RESPIRATORY_TRACT | Status: DC
  Administered 2019-08-29 – 2019-08-30 (×5): 3 mL via RESPIRATORY_TRACT
  Filled 2019-08-28 (×5): qty 3

## 2019-08-28 MED ORDER — ONDANSETRON HCL 4 MG PO TABS: 4.0000 mg | ORAL_TABLET | Freq: Four times a day (QID) | ORAL | Status: DC | PRN

## 2019-08-28 MED ORDER — ALBUTEROL SULFATE HFA 108 (90 BASE) MCG/ACT IN AERS: 1.0000 | INHALATION_SPRAY | Freq: Four times a day (QID) | RESPIRATORY_TRACT | Status: DC | PRN

## 2019-08-28 MED ORDER — SODIUM CHLORIDE 0.9 % IV SOLN: INTRAVENOUS | Status: DC

## 2019-08-28 MED ORDER — HEPARIN SODIUM (PORCINE) 5000 UNIT/ML IJ SOLN
5000.0000 [IU] | Freq: Three times a day (TID) | INTRAMUSCULAR | Status: DC
  Administered 2019-08-28 – 2019-08-30 (×8): 5000 [IU] via SUBCUTANEOUS
  Filled 2019-08-28 (×8): qty 1

## 2019-08-28 MED ORDER — PANTOPRAZOLE SODIUM 40 MG IV SOLR
40.0000 mg | INTRAVENOUS | Status: DC
  Administered 2019-08-28 – 2019-08-30 (×3): 40 mg via INTRAVENOUS
  Filled 2019-08-28 (×3): qty 40

## 2019-08-28 NOTE — H&P (Signed)
History and Physical    [name] [id number] DOB: 31-Jan-1941 DOA: 08/27/2019  PCP: Fayrene Helper, MD (Confirm with patient/family/NH records and if not entered, this has to be entered at [facility] point of entry) Patient coming from: Home  I have personally briefly reviewed patient's old medical records in [place]  Chief Complaint: Abdominal pain and vomiting  HPI: [name] is a 79 y.o. male with medical history significant of hypertension, hyperlipidemia, diabetes mellitus, GERD, cerebrovascular accident presented to ED for evaluation of generalized abdominal pain, distention and vomiting.  Patient was seen for the same problem yesterday and was discharged home after the lab work and CT of abdomen that showed large stool burden without bowel obstruction.  Patient was given MiraLAX but patient states that he used about 8 capfuls of MiraLAX but still have no bowel movement.  Patient states that he is passing some flatus but had at least 5 episodes of nonbilious and nonbloody vomiting today.  Patient has fever, chills, chest pain, shortness of breath and urinary symptoms.  ED Course: In the ED on arrival patient had blood pressure of 181/96, temperature of 98.4, heart rate 108, respiratory rate 17 and oxygen saturation 96% on room air.  At work showed WBC 15.3, hemoglobin 12.3, sodium 136, potassium 4.5, BUN 15, creatinine 9 and blood glucose 165.  X-ray abdomen showed findings consistent with SBO.  CT abdomen is performed that showed multiple dilated small bowel loops throughout the abdomen and pelvis with a distal small bowel obstruction.  The ED patient was given IV fluid in the ED and no vomiting since the patient came to the ED.  Review of Systems: As per HPI otherwise 10 point review of systems negative.   Past Medical History:  Diagnosis Date  . Abnormality of gait 05/01/2015  . Anemia 04/19/2015  . Annual physical exam 12/11/2013  . Arthritis    "left leg;  knees" (05/07/2017)  . Arthritis of knee   . CAD (coronary artery disease)   . Choking 07/13/2015  . Chronic bronchitis ([place])    "get it q yr"  . Chronic lower back pain    "since I was a teen" (05/07/2017)  . COPD (chronic obstructive pulmonary disease) ([place])   . CVA (cerebral vascular accident) ([place]) 05/2015   R sided weakness (05/07/2017)  . GERD (gastroesophageal reflux disease)   . Gout   . History of colonic polyps 04/03/2017   Added automatically from request for surgery [id number]  . Hyperlipidemia   . Hypertension   . Myocardial infarction ([place]) 1999  . Obesity   . OSA on CPAP   . Pneumonia    "a few times; last time was ~ 06/2013" (05/07/2017)  . Psoriasis   . Tussive syncope 05/01/2015  . Type II diabetes mellitus ([place])   . Vitamin B12 deficiency 05/01/2015    Past Surgical History:  Procedure Laterality Date  . APPENDECTOMY    . CATARACT EXTRACTION W/PHACO Left 10/07/2016   Procedure: CATARACT EXTRACTION PHACO AND INTRAOCULAR LENS PLACEMENT (IOC);  Surgeon: Rutherford Guys, MD;  Location: [facility];  Service: Ophthalmology;  Laterality: Left;  CDE: 13.49  . CATARACT EXTRACTION W/PHACO Right 10/28/2016   Procedure: CATARACT EXTRACTION PHACO AND INTRAOCULAR LENS PLACEMENT (IOC);  Surgeon: Rutherford Guys, MD;  Location: [facility];  Service: Ophthalmology;  Laterality: Right;  CDE: 16.71  . COLONOSCOPY  03/17/2012   Procedure: COLONOSCOPY;  Surgeon: Rogene Houston, MD;  Location: [facility];  Service: Endoscopy;  Laterality: N/A;  830  . COLONOSCOPY N/A 06/25/2017   Procedure: COLONOSCOPY;  Surgeon: Rogene Houston, MD;  Location[facility];  Service: Endoscopy;  Laterality: N/A;  [place]- 2009-08/30/2013   "counting today's, I have 5 stents" (08/30/2013)  . CORONARY ANGIOPLASTY WITH STENT PLACEMENT  05/07/2017  . CORONARY STENT INTERVENTION N/A 05/07/2017   Procedure: CORONARY STENT INTERVENTION;  Surgeon: Charolette Forward, MD;   Location: [place] CV LAB;  Service: Cardiovascular;  Laterality: N/A;  . CYSTOSCOPY WITH URETHRAL DILATATION N/A 11/26/2012   Procedure: CYSTOSCOPY WITH URETHRAL DILATATION;  Surgeon: Malka So, MD;  Location: [facility];  Service: Urology;  Laterality: N/A;  . LEFT HEART CATH AND CORONARY ANGIOGRAPHY N/A 05/07/2017   Procedure: LEFT HEART CATH AND CORONARY ANGIOGRAPHY;  Surgeon: Charolette Forward, MD;  Location: [place] [facility];  Service: Cardiovascular;  Laterality: N/A;  . LEFT HEART CATHETERIZATION WITH CORONARY ANGIOGRAM N/A 08/30/2013   Procedure: LEFT HEART CATHETERIZATION WITH CORONARY ANGIOGRAM;  Surgeon: Clent Demark, MD;  Location: [place] CATH LAB;  Service: Cardiovascular;  Laterality: N/A;  . PERCUTANEOUS CORONARY STENT INTERVENTION (PCI-S) Right 08/30/2013   Procedure: PERCUTANEOUS CORONARY STENT INTERVENTION (PCI-S);  Surgeon: Clent Demark, MD;  Location: [facility] CATH LAB;  Service: Cardiovascular;  Laterality: Right;     reports that he quit smoking about 16 years ago. His smoking use included cigarettes. He has a 16.50 pack-year smoking history. He has never used smokeless tobacco. He reports that he does not drink alcohol or use drugs.  No Known Allergies  Family History  Problem Relation Age of Onset  . Hypertension Mother   . Diabetes Mother   . Hypertension Father   . Diabetes Brother   . Stroke Daughter   . Arthritis Other        Family History   . Diabetes Other        family History     Prior to Admission medications   Medication Sig Start Date End Date Taking? Authorizing Provider  acetaminophen (TYLENOL) 500 MG tablet Take 1,000 mg by mouth every 6 (six) hours as needed for moderate pain (left thigh).    Yes [provider]  albuterol (VENTOLIN HFA) 108 (90 Base) MCG/ACT inhaler Inhale 1-2 puffs into the lungs every 6 (six) hours as needed for wheezing or shortness of breath. 07/07/19  Yes Perlie Mayo, NP  allopurinol (ZYLOPRIM) 300 MG tablet Take 1  tablet (300 mg total) by mouth daily. 04/22/19 04/22/20 Yes Fayrene Helper, MD  aspirin EC 81 MG tablet Take 81 mg by mouth daily.   Yes [provider]  budesonide-formoterol (SYMBICORT) 80-4.5 MCG/ACT inhaler Inhale 2 puffs into the lungs daily. 07/07/19  Yes Perlie Mayo, NP  cephALEXin (KEFLEX) 500 MG capsule Take 1 capsule (500 mg total) by mouth 3 (three) times daily for 7 days. 08/26/19 09/02/19 Yes Jacqlyn Larsen, PA-C  clopidogrel (PLAVIX) 75 MG tablet TAKE 1 TABLET (75 MG TOTAL) BY MOUTH DAILY. 04/25/19  Yes Fayrene Helper, MD  insulin NPH-regular Human (NOVOLIN 70/30) (70-30) 100 UNIT/ML injection INJECT 45 UNITS SUBCUTANEOUSLY WITH BREAKFAST AND 35 UNITS WITH SUPPER (WHEN GLUCOSE IS ABOVE 90) Patient taking differently: Inject 35-45 Units into the skin See admin instructions. INJECT 45 UNITS SUBCUTANEOUSLY WITH BREAKFAST AND 35 UNITS WITH SUPPER (WHEN GLUCOSE IS ABOVE 90) 06/28/19  Yes Nida, Marella Chimes, MD  isosorbide mononitrate (IMDUR) 30 MG 24 hr tablet Take 30  mg by mouth daily. 06/23/19  Yes [provider]  levETIRAcetam (KEPPRA) 500 MG tablet TAKE 1 TABLET TWICE DAILY Patient taking differently: Take 500 mg by mouth 2 (two) times daily.  02/08/19  Yes Fayrene Helper, MD  levothyroxine (SYNTHROID) 100 MCG tablet TAKE 1 TABLET (100 MCG TOTAL) BY MOUTH DAILY BEFORE BREAKFAST. 03/01/19  Yes Nida, Marella Chimes, MD  metFORMIN (GLUCOPHAGE) 500 MG tablet TAKE 1 TABLET EVERY DAY WITH BREAKFAST Patient taking differently: Take 500 mg by mouth daily with breakfast.  08/24/19  Yes Nida, Marella Chimes, MD  metoprolol tartrate (LOPRESSOR) 50 MG tablet TAKE 1/2 TABLET TWICE DAILY Patient taking differently: Take 25 mg by mouth 2 (two) times daily.  04/11/19  Yes Fayrene Helper, MD  nitroGLYCERIN (NITROSTAT) 0.4 MG SL tablet Place 1 tablet (0.4 mg total) under the tongue every 5 (five) minutes as needed for chest pain. 05/08/17 08/27/19 Yes Charolette Forward, MD    OXYGEN Inhale 2 L into the lungs continuous.   Yes [provider]  pantoprazole (PROTONIX) 40 MG tablet TAKE 1 TABLET EVERY DAY BEFORE BREAKFAST Patient taking differently: Take 40 mg by mouth daily. TAKE 1 TABLET EVERY DAY BEFORE BREAKFAST 04/25/19  Yes Fayrene Helper, MD  pravastatin (PRAVACHOL) 40 MG tablet TAKE 2 TABLETS EVERY DAY Patient taking differently: Take 80 mg by mouth daily.  05/26/19  Yes Fayrene Helper, MD  spironolactone (ALDACTONE) 25 MG tablet TAKE 1 TABLET EVERY DAY Patient taking differently: Take 25 mg by mouth daily.  02/08/19  Yes Fayrene Helper, MD  vitamin B-12 (CYANOCOBALAMIN) 1000 MCG tablet Take 1,000 mcg by mouth daily.   Yes [provider]  senna (SENOKOT) 8.6 MG tablet Take 1-2 tablets by mouth daily as needed for constipation.     [provider]    Physical Exam: Vitals:   08/28/19 0000 08/28/19 0030 08/28/19 0100 08/28/19 0200  BP: (!) 167/94  (!) 145/81 (!) 164/90  Pulse: (!) 101 95 96 88  Resp: 16 18 17 16   Temp:      TempSrc:      SpO2: 96% 96% 96% 95%  Weight:      Height:        Constitutional: NAD, calm, comfortable Vitals:   08/28/19 0000 08/28/19 0030 08/28/19 0100 08/28/19 0200  BP: (!) 167/94  (!) 145/81 (!) 164/90  Pulse: (!) 101 95 96 88  Resp: 16 18 17 16   Temp:      TempSrc:      SpO2: 96% 96% 96% 95%  Weight:      Height:        General: Patient is a obese 79 year old male in no acute distress Eyes: PERRL, lids and conjunctivae normal ENMT: Mucous membranes are moist. Posterior pharynx clear of any exudate or lesions.Normal dentition.  Neck: normal, supple, no masses, no thyromegaly Respiratory: clear to auscultation bilaterally, no wheezing, no crackles. Normal respiratory effort. No accessory muscle use.  Cardiovascular: Regular rate and rhythm, no murmurs / rubs / gallops. No extremity edema. 2+ pedal pulses. No carotid bruits.  Abdomen: Abdominal is distended with tenderness in  left lower quadrant.  Sluggish bowel sounds appreciated on auscultation.  No masses palpated. No hepatosplenomegaly. Musculoskeletal: no clubbing / cyanosis. No joint deformity upper and lower extremities. Good ROM, no contractures. Normal muscle tone.  Skin: no rashes, lesions, ulcers. No induration Neurologic: CN 2-12 grossly intact. Sensation intact, DTR normal.  Muscle strength is 4/5 in right lower extremity while 5/5  in rest of extremities. Psychiatric: Normal judgment and insight. Alert and oriented x 3. Normal mood.   Labs on Admission: I have personally reviewed following labs and imaging studies  CBC: Recent Labs  Lab 08/26/19 1637 08/27/19 2138  WBC 11.7* 15.3*  NEUTROABS 7.5 12.3*  HGB 11.1* 12.3*  HCT 36.6* 39.7  MCV 92.0 90.4  PLT 299 123456   Basic Metabolic Panel: Recent Labs  Lab 08/26/19 1637 08/27/19 2138  NA 135 136  K 4.6 4.5  CL 102 101  CO2 26 24  GLUCOSE 133* 165*  BUN 18 16  CREATININE 1.10 1.09  CALCIUM 8.9 9.4   GFR: Estimated Creatinine Clearance: 73.3 mL/min (by C-G formula based on SCr of 1.09 mg/dL). Liver Function Tests: Recent Labs  Lab 08/26/19 1637 08/27/19 2138  AST 14* 12*  ALT 12 12  ALKPHOS 63 65  BILITOT 0.4 0.4  PROT 7.6 7.6  ALBUMIN 3.6 3.7   Recent Labs  Lab 08/26/19 1637 08/27/19 2138  LIPASE 25 166*   No results for input(s): AMMONIA in the last 168 hours. Coagulation Profile: No results for input(s): INR, PROTIME in the last 168 hours. Cardiac Enzymes: No results for input(s): CKTOTAL, CKMB, CKMBINDEX, TROPONINI in the last 168 hours. BNP (last 3 results) No results for input(s): PROBNP in the last 8760 hours. HbA1C: No results for input(s): HGBA1C in the last 72 hours. CBG: No results for input(s): GLUCAP in the last 168 hours. Lipid Profile: No results for input(s): CHOL, HDL, LDLCALC, TRIG, CHOLHDL, LDLDIRECT in the last 72 hours. Thyroid Function Tests: No results for input(s): TSH, T4TOTAL, FREET4,  T3FREE, THYROIDAB in the last 72 hours. Anemia Panel: No results for input(s): VITAMINB12, FOLATE, FERRITIN, TIBC, IRON, RETICCTPCT in the last 72 hours. Urine analysis:    Component Value Date/Time   COLORURINE YELLOW 08/27/2019 2320   APPEARANCEUR HAZY (A) 08/27/2019 2320   LABSPEC 1.023 08/27/2019 2320   PHURINE 5.0 08/27/2019 2320   GLUCOSEU NEGATIVE 08/27/2019 2320   HGBUR SMALL (A) 08/27/2019 2320   BILIRUBINUR NEGATIVE 08/27/2019 2320   BILIRUBINUR negative 11/13/2014 1640   KETONESUR NEGATIVE 08/27/2019 2320   PROTEINUR 100 (A) 08/27/2019 2320   UROBILINOGEN 0.2 11/13/2014 1640   UROBILINOGEN 0.2 05/09/2013 2015   NITRITE NEGATIVE 08/27/2019 2320   LEUKOCYTESUR SMALL (A) 08/27/2019 2320    Radiological Exams on Admission: CT ABDOMEN PELVIS W CONTRAST  Result Date: 08/28/2019 CLINICAL DATA:  Abdominal pain and vomiting. EXAM: CT ABDOMEN AND PELVIS WITH CONTRAST TECHNIQUE: Multidetector CT imaging of the abdomen and pelvis was performed using the standard protocol following bolus administration of intravenous contrast. CONTRAST:  159mL OMNIPAQUE IOHEXOL 300 MG/ML  SOLN COMPARISON:  August 26, 2019 FINDINGS: Lower chest: Mild linear scarring and/or atelectasis is seen within the bilateral lung bases. Hepatobiliary: No focal liver abnormality is seen. No gallstones, gallbladder wall thickening, or biliary dilatation. Pancreas: Unremarkable. No pancreatic ductal dilatation or surrounding inflammatory changes. Spleen: Normal in size without focal abnormality. Adrenals/Urinary Tract: Adrenal glands are unremarkable. Kidneys are normal in size, without renal calculi or hydronephrosis. A 5.4 cm x 4.7 cm simple cyst is seen within the posteromedial aspect of the mid right kidney. A 1.5 cm cyst is seen within the posterolateral aspect of the mid left kidney. Bladder is unremarkable. Stomach/Bowel: Stomach is within normal limits. The appendix is not clearly identified. Multiple dilated small  bowel loops are seen throughout the abdomen and pelvis. A clear transition zone is not identified. Vascular/Lymphatic: Marked severity aortic  calcification. No enlarged abdominal or pelvic lymph nodes. Reproductive: Prostate is unremarkable. Other: There is a small amount of perihepatic fluid. A small amount of pelvic free fluid is also seen. Musculoskeletal: Multilevel degenerative changes seen throughout the lumbar spine. IMPRESSION: 1. Multiple dilated small bowel loops throughout the abdomen and pelvis consistent with a distal small bowel obstruction. 2. Small amount of ascites. 3. Bilateral simple renal cysts. Aortic Atherosclerosis (ICD10-I70.0). Electronically Signed   By: Virgina Norfolk M.D.   On: 08/28/2019 00:05   CT ABDOMEN PELVIS W CONTRAST  Result Date: 08/26/2019 CLINICAL DATA:  Bowel obstruction, abdominal distension. EXAM: CT ABDOMEN AND PELVIS WITH CONTRAST TECHNIQUE: Multidetector CT imaging of the abdomen and pelvis was performed using the standard protocol following bolus administration of intravenous contrast. CONTRAST:  139mL OMNIPAQUE IOHEXOL 300 MG/ML  SOLN COMPARISON:  11/15/2009 FINDINGS: Lower chest: Basilar bronchiectasis and tree-in-bud opacities at the lung bases bilaterally. No significant septal thickening. No consolidation. No pleural effusion. Calcified coronary artery disease likely with prior percutaneous coronary intervention. Heart is incompletely imaged. No pericardial effusion. Hepatobiliary: No focal hepatic lesion. No ductal dilation. No pericholecystic stranding. Pancreas: Pancreas is normal without focal lesion, ductal dilation or peripancreatic inflammation. Spleen: Spleen will size without focal lesion. Adrenals/Urinary Tract: Adrenal glands are normal. Upper pole cyst arising from the right kidney similar to the prior study. No hydronephrosis. Urinary bladder is normal. Small low-density lesion in the left kidney measures water density also likely a small cyst.  Renal enhancement otherwise symmetric. Stomach/Bowel: Stomach mildly distended. Small bowel of normal caliber without acute process. Colon is filled with liquid and partially formed stool. Vascular/Lymphatic: Calcified and noncalcified atherosclerotic plaque in the abdominal aorta. No added [place] in the upper abdomen or in the retroperitoneum. No pelvic lymphadenopathy. Reproductive: Prostate mildly heterogeneous, nonspecific on CT. Other: No free air. No ascites. Musculoskeletal: No acute bone finding or destructive bone process. IMPRESSION: 1. No bowel obstruction. 2. Abundant stool in the colon both liquid and formed. 3. Basilar bronchiectasis and tree-in-bud opacities at the lung bases bilaterally, findings may be related to aspiration or atypical infection with chronicity, correlate with any risk of aspiration. 4. Aortic atherosclerosis. Aortic Atherosclerosis (ICD10-I70.0). Electronically Signed   By: Zetta Bills M.D.   On: 08/26/2019 19:02   DG Abdomen Acute W/Chest  Result Date: 08/27/2019 CLINICAL DATA:  Abdominal pain. EXAM: DG ABDOMEN ACUTE W/ 1V CHEST COMPARISON:  May 18, 2015 FINDINGS: Multiple dilated small bowel loops are seen overlying the mid abdomen. No radiopaque calculi or other significant radiographic abnormality is seen. Heart size and mediastinal contours are within normal limits. Both lungs are clear. IMPRESSION: Findings consistent with small-bowel obstruction. Electronically Signed   By: Virgina Norfolk M.D.   On: 08/27/2019 23:01    EKG: Independently reviewed  Assessment/Plan Principal Problem:   SBO (small bowel obstruction) ([place]) Patient had no bowel movement for the last 2 days and CT scan showed small bowel obstruction. Nausea and vomiting improved and there is no indication to place the NG tube at this time. Patient admits of passing gas and does not look toxic so we will continue medical management with gentle IV fluids, pain medications and medications  for nausea and vomiting.  N.p.o. at this time.  Will consider consulting general surgery if the patient's condition got worse or no progress in the next 48 hours.  Active Problems:   Nausea and vomiting Improved. Zofran every 6 hours as needed ordered for nausea and vomiting.    Hyperglycemia  Moderate dose sliding scale insulin ordered.  Blood glucose monitoring and hypoglycemic protocol in place.     Accelerated hypertension IV hydralazine ordered if systolic blood pressure is above [id number] or diastolic above [id number].    DVT prophylaxis: Heparin  Code Status: Full code Family Communication:  Disposition Plan: Consults called:  Admission status:    Edmonia Lynch MD [facility] Pager [phone]   If 7PM-7AM, please contact night-coverage [email or web address] Password   08/28/2019, 3:04 AM

## 2019-08-28 NOTE — Progress Notes (Signed)
PROGRESS NOTE  [name] [id number] DOB: 12-20-1940 DOA: 08/27/2019 PCP: Fayrene Helper, MD  Brief History:  79 year old male with a history of diabetes mellitus type 2, CKD stage III, coronary disease, stroke, seizure disorder, COPD, hyperlipidemia, chronic respiratory failure on 2 L, B12 deficiency presenting with 2-day history of abdominal pain that started on 08/26/2019.  Patient states that his last bowel movement was on 08/25/2019.  He normally does not have to use any type of stool softeners or laxatives to have a bowel movement.  Because of worsening abdominal pain he came to the emergency department on 08/26/2019.  CT of the abdomen and pelvis was performed at that time that showed abundant stool.  He was given MiraLAX and discharged home in stable condition.  At home, the patient states that he used 8 capfuls of MiraLAX without successful BM.  He continued to have abdominal pain.  As result, he presented for further evaluation.  He denies any previous history of small bowel obstruction.  The patient had 5 episodes of nonbloody emesis on 08/27/2019.  He has not passed flatus. In the emergency department, the patient was afebrile hemodynamically stable with oxygen saturation 95% on 2 L.  BMP and LFTs were unremarkable.  WBC 15.3, hemoglobin 12.3, platelets 288,000.  UA showed 11-20 WBC.  CT of the abdomen and pelvis on 08/27/2019 showed multiple dilated loops of small bowel without clear transition point.  There is a small amount of perihepatic fluid.  The patient was placed on IV fluids and bowel rest.  General surgery was consulted to assist with management.  Assessment/Plan: Ileus versus small bowel obstruction -08/27/2019 CT abdomen/pelvis--multiple dilated small bowel loops without clear transition point.  Bibasilar scarring. -General surgery consult--discussed with Dr. Merry Proud management for now -Remain n.p.o. -IV fluids -Judicious IV  opioids -Transition essential medicines IV if possible  Chronic respiratory failure with hypoxia -Stable on 2 L at home   Diabetes mellitus type 2 -Hemoglobin A1c -NovoLog sliding scale -Reduced dose Lantus  Essential hypertension -Transition metoprolol to IV  Coronary artery disease -Aspirin suppository -No chest pain presently -Restart Imdur when able to take p.o.  Hyperlipidemia -Restart statin when able to tolerate p.o.  Hypothyroidism -Transition Synthroid to IV until able to tolerate p.o.  COPD -Start duo nebs -Start Pulmicort      Disposition Plan: Patient From: Home D/C Place: Home - 2-3  Days Barriers: Not Clinically Stable--remains npo with SBO  Family Communication:    NoFamily at bedside  Consultants:  none  Code Status:  FULL   DVT Prophylaxis:  Gordon Heparin    Procedures: As Listed in Progress Note Above  Antibiotics: None   Total time spent 35 minutes.  Greater than 50% spent face to face counseling and coordinating care.    Subjective: Pt complain of abd pain, a little better than yesterday.  Denies n/v/d.  No BM or flatus.  Denies f/c, n/v/d.  Has some sob and dry cough.  No hemoptysis.  No dysuria  Objective: Vitals:   08/28/19 0330 08/28/19 0358 08/28/19 0749 08/28/19 0758  BP: (!) 168/89 (!) 150/104  140/69  Pulse: 90 (!) 101  99  Resp: 18 18  18   Temp:  98.1 F (36.7 C)  98 F (36.7 C)  TempSrc:  Oral  Oral  SpO2: 95% 95% 95% 98%  Weight:  117.2 kg    Height:  5\' 10"  (1.778 m)  Intake/Output Summary (Last 24 hours) at 08/28/2019 0848 Last data filed at 08/28/2019 0413 Gross per 24 hour  Intake 0 ml  Output --  Net 0 ml   Weight change:  Exam:   General:  Pt is alert, follows commands appropriately, not in acute distress  HEENT: No icterus, No thrush, No neck mass, [place]/AT  Cardiovascular: RRR, S1/S2, no rubs, no gallops  Respiratory:bibasilar rales.  Bibasilar wheeze  Abdomen: Soft/+BS, generalized  tender, non distended, no guarding  Extremities: No edema, No lymphangitis, No petechiae, No rashes, no synovitis   Data Reviewed: I have personally reviewed following labs and imaging studies Basic Metabolic Panel: Recent Labs  Lab 08/26/19 1637 08/27/19 2138 08/28/19 0456  NA 135 136 137  K 4.6 4.5 4.5  CL 102 101 101  CO2 26 24 26   GLUCOSE 133* 165* 131*  BUN 18 16 16   CREATININE 1.10 1.09 1.03  CALCIUM 8.9 9.4 8.9   Liver Function Tests: Recent Labs  Lab 08/26/19 1637 08/27/19 2138 08/28/19 0456  AST 14* 12* 11*  ALT 12 12 10   ALKPHOS 63 65 57  BILITOT 0.4 0.4 0.5  PROT 7.6 7.6 6.8  ALBUMIN 3.6 3.7 3.3*   Recent Labs  Lab 08/26/19 1637 08/27/19 2138  LIPASE 25 166*   No results for input(s): AMMONIA in the last 168 hours. Coagulation Profile: No results for input(s): INR, PROTIME in the last 168 hours. CBC: Recent Labs  Lab 08/26/19 1637 08/27/19 2138 08/28/19 0456  WBC 11.7* 15.3* 13.7*  NEUTROABS 7.5 12.3*  --   HGB 11.1* 12.3* 11.6*  HCT 36.6* 39.7 38.8*  MCV 92.0 90.4 92.6  PLT 299 315 288   Cardiac Enzymes: No results for input(s): CKTOTAL, CKMB, CKMBINDEX, TROPONINI in the last 168 hours. BNP: Invalid input(s): POCBNP CBG: Recent Labs  Lab 08/28/19 0445 08/28/19 0735  GLUCAP 117* 141*   HbA1C: No results for input(s): HGBA1C in the last 72 hours. Urine analysis:    Component Value Date/Time   COLORURINE YELLOW 08/27/2019 2320   APPEARANCEUR HAZY (A) 08/27/2019 2320   LABSPEC 1.023 08/27/2019 2320   PHURINE 5.0 08/27/2019 2320   GLUCOSEU NEGATIVE 08/27/2019 2320   HGBUR SMALL (A) 08/27/2019 2320   BILIRUBINUR NEGATIVE 08/27/2019 2320   BILIRUBINUR negative 11/13/2014 1640   KETONESUR NEGATIVE 08/27/2019 2320   PROTEINUR 100 (A) 08/27/2019 2320   UROBILINOGEN 0.2 11/13/2014 1640   UROBILINOGEN 0.2 05/09/2013 2015   NITRITE NEGATIVE 08/27/2019 2320   LEUKOCYTESUR SMALL (A) 08/27/2019 2320   Sepsis  Labs: @LABRCNTIP (procalcitonin:4,lacticidven:4) )No results found for this or any previous visit (from the past 240 hour(s)).   Scheduled Meds: . heparin  5,000 Units Subcutaneous Q8H  . insulin aspart  0-15 Units Subcutaneous TID WC  . insulin aspart  0-5 Units Subcutaneous QHS  . mometasone-formoterol  2 puff Inhalation BID   Continuous Infusions: . sodium chloride 75 mL/hr at 08/28/19 0413    Procedures/Studies: CT ABDOMEN PELVIS W CONTRAST  Result Date: 08/28/2019 CLINICAL DATA:  Abdominal pain and vomiting. EXAM: CT ABDOMEN AND PELVIS WITH CONTRAST TECHNIQUE: Multidetector CT imaging of the abdomen and pelvis was performed using the standard protocol following bolus administration of intravenous contrast. CONTRAST:  162mL OMNIPAQUE IOHEXOL 300 MG/ML  SOLN COMPARISON:  August 26, 2019 FINDINGS: Lower chest: Mild linear scarring and/or atelectasis is seen within the bilateral lung bases. Hepatobiliary: No focal liver abnormality is seen. No gallstones, gallbladder wall thickening, or biliary dilatation. Pancreas: Unremarkable. No pancreatic ductal dilatation or  surrounding inflammatory changes. Spleen: Normal in size without focal abnormality. Adrenals/Urinary Tract: Adrenal glands are unremarkable. Kidneys are normal in size, without renal calculi or hydronephrosis. A 5.4 cm x 4.7 cm simple cyst is seen within the posteromedial aspect of the mid right kidney. A 1.5 cm cyst is seen within the posterolateral aspect of the mid left kidney. Bladder is unremarkable. Stomach/Bowel: Stomach is within normal limits. The appendix is not clearly identified. Multiple dilated small bowel loops are seen throughout the abdomen and pelvis. A clear transition zone is not identified. Vascular/Lymphatic: Marked severity aortic calcification. No enlarged abdominal or pelvic lymph nodes. Reproductive: Prostate is unremarkable. Other: There is a small amount of perihepatic fluid. A small amount of pelvic free fluid is  also seen. Musculoskeletal: Multilevel degenerative changes seen throughout the lumbar spine. IMPRESSION: 1. Multiple dilated small bowel loops throughout the abdomen and pelvis consistent with a distal small bowel obstruction. 2. Small amount of ascites. 3. Bilateral simple renal cysts. Aortic Atherosclerosis (ICD10-I70.0). Electronically Signed   By: Virgina Norfolk M.D.   On: 08/28/2019 00:05   CT ABDOMEN PELVIS W CONTRAST  Result Date: 08/26/2019 CLINICAL DATA:  Bowel obstruction, abdominal distension. EXAM: CT ABDOMEN AND PELVIS WITH CONTRAST TECHNIQUE: Multidetector CT imaging of the abdomen and pelvis was performed using the standard protocol following bolus administration of intravenous contrast. CONTRAST:  125mL OMNIPAQUE IOHEXOL 300 MG/ML  SOLN COMPARISON:  11/15/2009 FINDINGS: Lower chest: Basilar bronchiectasis and tree-in-bud opacities at the lung bases bilaterally. No significant septal thickening. No consolidation. No pleural effusion. Calcified coronary artery disease likely with prior percutaneous coronary intervention. Heart is incompletely imaged. No pericardial effusion. Hepatobiliary: No focal hepatic lesion. No ductal dilation. No pericholecystic stranding. Pancreas: Pancreas is normal without focal lesion, ductal dilation or peripancreatic inflammation. Spleen: Spleen will size without focal lesion. Adrenals/Urinary Tract: Adrenal glands are normal. Upper pole cyst arising from the right kidney similar to the prior study. No hydronephrosis. Urinary bladder is normal. Small low-density lesion in the left kidney measures water density also likely a small cyst. Renal enhancement otherwise symmetric. Stomach/Bowel: Stomach mildly distended. Small bowel of normal caliber without acute process. Colon is filled with liquid and partially formed stool. Vascular/Lymphatic: Calcified and noncalcified atherosclerotic plaque in the abdominal aorta. No added [place] in the upper abdomen or in the  retroperitoneum. No pelvic lymphadenopathy. Reproductive: Prostate mildly heterogeneous, nonspecific on CT. Other: No free air. No ascites. Musculoskeletal: No acute bone finding or destructive bone process. IMPRESSION: 1. No bowel obstruction. 2. Abundant stool in the colon both liquid and formed. 3. Basilar bronchiectasis and tree-in-bud opacities at the lung bases bilaterally, findings may be related to aspiration or atypical infection with chronicity, correlate with any risk of aspiration. 4. Aortic atherosclerosis. Aortic Atherosclerosis (ICD10-I70.0). Electronically Signed   By: Zetta Bills M.D.   On: 08/26/2019 19:02   DG Abdomen Acute W/Chest  Result Date: 08/27/2019 CLINICAL DATA:  Abdominal pain. EXAM: DG ABDOMEN ACUTE W/ 1V CHEST COMPARISON:  May 18, 2015 FINDINGS: Multiple dilated small bowel loops are seen overlying the mid abdomen. No radiopaque calculi or other significant radiographic abnormality is seen. Heart size and mediastinal contours are within normal limits. Both lungs are clear. IMPRESSION: Findings consistent with small-bowel obstruction. Electronically Signed   By: Virgina Norfolk M.D.   On: 08/27/2019 23:01    Orson Eva, DO  [facility]  If 7PM-7AM, please contact night-coverage [email or web address] Password [facility] 08/28/2019, 8:48 AM   LOS: 0 days

## 2019-08-28 NOTE — ED Provider Notes (Signed)
CT imaging reveals small bowel obstruction.  Patient appears distended with focal abdominal tenderness.  He denies any nausea/vomiting this time.  Will defer NG tube. Discussed with Dr. Humphrey Rolls for admission Defer gen. Surgery consult for now   Ripley Fraise, MD 08/28/19 0101

## 2019-08-29 ENCOUNTER — Inpatient Hospital Stay (HOSPITAL_COMMUNITY): Payer: Medicare HMO

## 2019-08-29 DIAGNOSIS — K56609 Unspecified intestinal obstruction, unspecified as to partial versus complete obstruction: Secondary | ICD-10-CM | POA: Diagnosis not present

## 2019-08-29 DIAGNOSIS — J9611 Chronic respiratory failure with hypoxia: Secondary | ICD-10-CM | POA: Diagnosis not present

## 2019-08-29 DIAGNOSIS — I10 Essential (primary) hypertension: Secondary | ICD-10-CM | POA: Diagnosis not present

## 2019-08-29 LAB — CBC
HCT: 37 % — ABNORMAL LOW (ref 39.0–52.0)
Hemoglobin: 10.8 g/dL — ABNORMAL LOW (ref 13.0–17.0)
MCH: 27.6 pg (ref 26.0–34.0)
MCHC: 29.2 g/dL — ABNORMAL LOW (ref 30.0–36.0)
MCV: 94.4 fL (ref 80.0–100.0)
Platelets: 276 10*3/uL (ref 150–400)
RBC: 3.92 MIL/uL — ABNORMAL LOW (ref 4.22–5.81)
RDW: 15.3 % (ref 11.5–15.5)
WBC: 10.9 10*3/uL — ABNORMAL HIGH (ref 4.0–10.5)
nRBC: 0 % (ref 0.0–0.2)

## 2019-08-29 LAB — GLUCOSE, CAPILLARY
Glucose-Capillary: 103 mg/dL — ABNORMAL HIGH (ref 70–99)
Glucose-Capillary: 116 mg/dL — ABNORMAL HIGH (ref 70–99)
Glucose-Capillary: 95 mg/dL (ref 70–99)
Glucose-Capillary: 91 mg/dL (ref 70–99)

## 2019-08-29 LAB — MAGNESIUM: Magnesium: 1.6 mg/dL — ABNORMAL LOW (ref 1.7–2.4)

## 2019-08-29 LAB — BASIC METABOLIC PANEL
Anion gap: 8 (ref 5–15)
BUN: 13 mg/dL (ref 8–23)
CO2: 27 mmol/L (ref 22–32)
Calcium: 8.5 mg/dL — ABNORMAL LOW (ref 8.9–10.3)
Chloride: 103 mmol/L (ref 98–111)
Creatinine, Ser: 1.11 mg/dL (ref 0.61–1.24)
GFR calc Af Amer: >60 mL/min (ref >60)
GFR calc non Af Amer: >60 mL/min (ref >60)
Glucose, Bld: 109 mg/dL — ABNORMAL HIGH (ref 70–99)
Potassium: 4.6 mmol/L (ref 3.5–5.1)
Sodium: 138 mmol/L (ref 135–145)

## 2019-08-29 LAB — PHOSPHORUS: Phosphorus: 3.9 mg/dL (ref 2.5–4.6)

## 2019-08-29 MED ORDER — DIATRIZOATE MEGLUMINE & SODIUM 66-10 % PO SOLN
ORAL | Status: AC
  Administered 2019-08-29: 90 mL
  Filled 2019-08-29: qty 90

## 2019-08-29 MED ORDER — DIATRIZOATE MEGLUMINE & SODIUM 66-10 % PO SOLN
90.0000 mL | Freq: Once | ORAL | Status: DC
  Filled 2019-08-29: qty 90

## 2019-08-29 MED ORDER — DIATRIZOATE MEGLUMINE & SODIUM 66-10 % PO SOLN
90.0000 mL | Freq: Once | ORAL | Status: AC
  Administered 2019-08-29: 90 mL via NASOGASTRIC
  Filled 2019-08-29: qty 90

## 2019-08-29 MED ORDER — MAGNESIUM SULFATE 2 GM/50ML IV SOLN
2.0000 g | Freq: Once | INTRAVENOUS | Status: AC
  Administered 2019-08-29: 2 g via INTRAVENOUS
  Filled 2019-08-29: qty 50

## 2019-08-29 NOTE — Evaluation (Signed)
Physical Therapy Evaluation Patient Details Name: [name] MRN: [id number] DOB: 20-Oct-1940 Today's Date: 08/29/2019   History of Present Illness  [name] is a 79 y.o. male with medical history significant of hypertension, hyperlipidemia, diabetes mellitus, GERD, cerebrovascular accident presented to ED for evaluation of generalized abdominal pain, distention and vomiting.  Patient was seen for the same problem yesterday and was discharged home after the lab work and CT of abdomen that showed large stool burden without bowel obstruction.  Patient was given MiraLAX but patient states that he used about 8 capfuls of MiraLAX but still have no bowel movement.  Patient states that he is passing some flatus but had at least 5 episodes of nonbilious and nonbloody vomiting today.  Patient has fever, chills, chest pain, shortness of breath and urinary symptoms.    Clinical Impression  Patient demonstrates labored movement for sitting up at bedside, c/o difficulty maintaining sitting balance with occasional leaning to the left and c/o dizziness when sitting up.  Patient limited to a few steps at bedside due to c/o fatigue and generalized weakness.  Patient tolerated sitting up in chair with his spouse present in room after therapy.  Patient will benefit from continued physical therapy in hospital and recommended venue below to increase strength, balance, endurance for safe ADLs and gait.     Follow Up Recommendations SNF;Supervision for mobility/OOB;Supervision - Intermittent    Equipment Recommendations  None recommended by PT    Recommendations for Other Services       Precautions / Restrictions Precautions Precautions: Fall Restrictions Weight Bearing Restrictions: No      Mobility  Bed Mobility Overal bed mobility: Needs Assistance Bed Mobility: Supine to Sit     Supine to sit: Supervision     General bed mobility comments: increased time, labored  movement  Transfers Overall transfer level: Needs assistance Equipment used: Rolling walker (2 wheeled) Transfers: Sit to/from [facility] Sit to Stand: Min guard Stand pivot transfers: Min guard       General transfer comment: increased time, labored movement  Ambulation/Gait Ambulation/Gait assistance: Min guard;Min assist Gait Distance (Feet): 4 Feet Assistive device: Rolling walker (2 wheeled) Gait Pattern/deviations: Decreased step length - right;Decreased step length - left;Decreased stride length Gait velocity: decreased   General Gait Details: limited to 4-5 labored steps at bedside due to c/o fatigue and dizziness  Stairs            Wheelchair Mobility    Modified Rankin (Stroke Patients Only)       Balance Overall balance assessment: Needs assistance Sitting-balance support: Feet supported;No upper extremity supported Sitting balance-Leahy Scale: Fair Sitting balance - Comments: seated at bedside, c/o diffiuclty maintaining sitting balance   Standing balance support: During functional activity;Bilateral upper extremity supported Standing balance-Leahy Scale: Fair Standing balance comment: using RW                             Pertinent Vitals/Pain Pain Assessment: 0-10    Home Living Family/patient expects to be discharged to:: Private residence   Available Help at Discharge: Family;Available PRN/intermittently Type of Home: House Home Access: Stairs to enter Entrance Stairs-Rails: None Entrance Stairs-Number of Steps: 1 Home Layout: One level Home Equipment: Walker - 4 wheels;Walker - 2 wheels;Cane - single point;Shower seat;Wheelchair - manual;Bedside commode      Prior Function Level of Independence: Independent with assistive device(s)         Comments: Household  and short distanced community ambulator with Rollator     Hand Dominance   Dominant Hand: Left    Extremity/Trunk Assessment   Upper  Extremity Assessment Upper Extremity Assessment: Generalized weakness    Lower Extremity Assessment Lower Extremity Assessment: Generalized weakness    Cervical / Trunk Assessment Cervical / Trunk Assessment: Normal  Communication   Communication: No difficulties  Cognition Arousal/Alertness: Awake/alert Behavior During Therapy: WFL for tasks assessed/performed Overall Cognitive Status: Within Functional Limits for tasks assessed                                        General Comments      Exercises     Assessment/Plan    PT Assessment Patient needs continued PT services  PT Problem List Decreased strength;Decreased activity tolerance;Decreased balance;Decreased mobility       PT Treatment Interventions Balance training;Gait training;Stair training;Functional mobility training;Therapeutic activities;Therapeutic exercise;Patient/family education    PT Goals (Current goals can be found in the Care Plan section)  Acute Rehab PT Goals Patient Stated Goal: return home with family to assist PT Goal Formulation: With patient/family Time For Goal Achievement: 09/12/19 Potential to Achieve Goals: Good    Frequency Min 3X/week   Barriers to discharge        Co-evaluation               AM-PAC PT "6 Clicks" Mobility  Outcome Measure Help needed turning from your back to your side while in a flat bed without using bedrails?: A Little Help needed moving from lying on your back to sitting on the side of a flat bed without using bedrails?: A Little Help needed moving to and from a bed to a chair (including a wheelchair)?: A Little Help needed standing up from a chair using your arms (e.g., wheelchair or bedside chair)?: A Little Help needed to walk in hospital room?: A Little Help needed climbing 3-5 steps with a railing? : A Lot 6 Click Score: 17    End of Session Equipment Utilized During Treatment: Oxygen Activity Tolerance: Patient tolerated  treatment well;Patient limited by fatigue Patient left: in chair;with call bell/phone within reach;with family/visitor present Nurse Communication: Mobility status PT Visit Diagnosis: Unsteadiness on feet (R26.81);Other abnormalities of gait and mobility (R26.89);Muscle weakness (generalized) (M62.81)    Time: [id number] PT Time Calculation (min) (ACUTE ONLY): 27 min   Charges:   PT Evaluation $PT Eval Moderate Complexity: 1 Mod PT Treatments $Therapeutic Activity: 23-37 mins        2:00 PM, 08/29/19 Lonell Grandchild, MPT Physical Therapist with [facility] [phone] office [phone] mobile phone

## 2019-08-29 NOTE — Plan of Care (Signed)
  Problem: Acute Rehab PT Goals(only PT should resolve) Goal: Pt Will Go Supine/Side To Sit Outcome: Progressing Flowsheets (Taken 08/29/2019 1402) Pt will go Supine/Side to Sit: with modified independence Goal: Patient Will Transfer Sit To/From Stand Outcome: Progressing Flowsheets (Taken 08/29/2019 1402) Patient will transfer sit to/from stand: with supervision Goal: Pt Will Transfer Bed To Chair/Chair To Bed Outcome: Progressing Flowsheets (Taken 08/29/2019 1402) Pt will Transfer Bed to Chair/Chair to Bed: with supervision Goal: Pt Will Ambulate Outcome: Progressing Flowsheets (Taken 08/29/2019 1402) Pt will Ambulate:  75 feet  with supervision  with rolling walker   2:03 PM, 08/29/19 Lonell Grandchild, MPT Physical Therapist with [facility] [phone] office [phone] mobile phone

## 2019-08-29 NOTE — TOC Initial Note (Signed)
Transition of Care [facility]) - Initial/Assessment Note    Patient Details  Name: [name] MRN: [id number] Date of Birth: [birth year]  [facility] CM/SW Contact:    Boneta Lucks, RN Phone Number: 08/29/2019, 1:33 PM  Clinical Narrative:    Patient admitted with Small bowel obstruction. Patient lives at home with his wife and walks with a cane and has a walker if needed. PT is recommending SNF. Patient states at this time he wants to go home at discharge, however is he requires surgery he will consider SNF. TOC to follow.                Expected Discharge Plan: Home/Self Care Barriers to Discharge: Continued Medical Work up   Patient Goals and CMS Choice Patient states their goals for this hospitalization and ongoing recovery are:: to go home. CMS Medicare.gov Compare Post Acute Care list provided to:: Patient Choice offered to / list presented to : Patient  Expected Discharge Plan and Services Expected Discharge Plan: Home/Self Care  Living arrangements for the past 2 months: Single Family Home                  Prior Living Arrangements/Services Living arrangements for the past 2 months: Single Family Home Lives with:: Spouse          Activities of Daily Living Home Assistive Devices/Equipment: Cane (specify quad or straight), Walker (specify type) ADL Screening (condition at time of admission) Patient's cognitive ability adequate to safely complete daily activities?: Yes Is the patient deaf or have difficulty hearing?: Yes Does the patient have difficulty seeing, even when wearing glasses/contacts?: No Does the patient have difficulty concentrating, remembering, or making decisions?: Yes Patient able to express need for assistance with ADLs?: Yes Does the patient have difficulty dressing or bathing?: No Independently performs ADLs?: Yes (appropriate for developmental age) Does the patient have difficulty walking or climbing stairs?: Yes Weakness of Legs:  Right Weakness of Arms/Hands: Right  Permission Sought/Granted     Emotional Assessment     Affect (typically observed): Accepting Orientation: : Oriented to Self, Oriented to Place, Oriented to  Time, Oriented to Situation Alcohol / Substance Use: Not Applicable Psych Involvement: No (comment)  Admission diagnosis:  Small bowel obstruction (HCC) [K56.609] SBO (small bowel obstruction) ([place]) [K56.609] Patient Active Problem List   Diagnosis Date Noted  . SBO (small bowel obstruction) ([place]) 08/28/2019  . Nausea and vomiting 08/28/2019  . Hyperglycemia 08/28/2019  . Accelerated hypertension 08/28/2019  . Chronic respiratory failure with hypoxia ([place]) 08/28/2019  . At moderate risk for fall 03/02/2019  . Longitudinal overcurvature of nails 07/14/2018  . Uncontrolled type 2 diabetes mellitus with hyperglycemia ([place]) 09/07/2017  . New-onset angina ([place]) 05/07/2017  . CKD (chronic kidney disease) stage 2, GFR 60-89 ml/min 06/04/2015  . Hypothyroidism 06/04/2015  . Back pain 06/01/2015  . Partial seizure ([place]) 05/12/2015  . Right hemiparesis ([place]) 05/12/2015  . Stroke ([place]) 05/10/2015  . GERD (gastroesophageal reflux disease) 05/10/2015  . CVA (cerebral infarction) 05/10/2015  . Vitamin B12 deficiency 05/01/2015  . Abnormality of gait 05/01/2015  . Tussive syncope 05/01/2015  . Recurrent falls 04/29/2015  . Dysphagia 04/26/2015  . Carotid bruit 12/08/2013  . Type 2 diabetes mellitus with diabetic nephropathy, with long-term current use of insulin ([place]) 05/22/2013  . COPD (chronic obstructive pulmonary disease) ([place]) 11/22/2012  . Hypoxia 08/30/2012  . Vitamin D deficiency 12/23/2011  . PSORIASIS 07/18/2010  . Gout 01/13/2010  . Mixed hyperlipidemia 10/05/2007  .  Morbid obesity ([place]) 10/05/2007  . Essential hypertension 10/05/2007  . Coronary atherosclerosis 10/05/2007  . GERD 10/05/2007  . Arthritis of knee, right 10/05/2007   PCP:  Fayrene Helper, MD Pharmacy:    [facility], [place] [address] Phone: [phone] Fax: [phone]  [facility] [address] [place] [address] [place] [address] Phone: [phone] Fax: [phone]  [facility] [place], [place] [address] Phone: [phone] Fax: [phone]

## 2019-08-29 NOTE — Care Management Important Message (Signed)
Important Message  Patient Details  Name: [name] MRN: [id number] Date of Birth: [birth year]   Medicare Important Message Given:  Yes     [name] 08/29/2019, 4:04 PM

## 2019-08-29 NOTE — Progress Notes (Signed)
[facility]   X-ray with contrast into the colon. No high grade obstruction. Will do clears for now, have patient go slowly. X-ray in the AM.  Curlene Labrum, MD

## 2019-08-29 NOTE — Progress Notes (Addendum)
PROGRESS NOTE  [name] [id number] DOB: Apr 20, 1941 DOA: 08/27/2019 PCP: Fayrene Helper, MD  Brief History:  79 year old male with a history of diabetes mellitus type 2, CKD stage III, coronary disease, stroke, seizure disorder, COPD, hyperlipidemia, chronic respiratory failure on 2 L, B12 deficiency presenting with 2-day history of abdominal pain that started on 08/26/2019.  Patient states that his last bowel movement was on 08/25/2019.  He normally does not have to use any type of stool softeners or laxatives to have a bowel movement.  Because of worsening abdominal pain he came to the emergency department on 08/26/2019.  CT of the abdomen and pelvis was performed at that time that showed abundant stool.  He was given MiraLAX and discharged home in stable condition.  At home, the patient states that he used 8 capfuls of MiraLAX without successful BM.  He continued to have abdominal pain.  As result, he presented for further evaluation.  He denies any previous history of small bowel obstruction.  The patient had 5 episodes of nonbloody emesis on 08/27/2019.  He has not passed flatus. In the emergency department, the patient was afebrile hemodynamically stable with oxygen saturation 95% on 2 L.  BMP and LFTs were unremarkable.  WBC 15.3, hemoglobin 12.3, platelets 288,000.  UA showed 11-20 WBC.  CT of the abdomen and pelvis on 08/27/2019 showed multiple dilated loops of small bowel without clear transition point.  There is a small amount of perihepatic fluid.  The patient was placed on IV fluids and bowel rest.  General surgery was consulted to assist with management.  Assessment/Plan:  Ileus versus small bowel obstruction -08/27/2019 CT abdomen/pelvis--multiple dilated small bowel loops without clear transition point.  Bibasilar scarring. -General surgery consult--discussed with Dr. Alfonse Ras management for now -Remain n.p.o. -IV fluids -Judicious IV opioids -Transition  essential medicines IV if possible -08/29/19--SBFT--numerous air filled normal caliber SB bowel loops with less colonic gase  Chronic respiratory failure with hypoxia -Stable on 2 L at home   Diabetes mellitus type 2 -Hemoglobin A1c -NovoLog sliding scale -Reduced dose Lantus  Essential hypertension -Transition metoprolol to IV  Coronary artery disease -Aspirin suppository -No chest pain presently -Restart Imdur when able to take p.o.  Hyperlipidemia -Restart statin when able to tolerate p.o.  Hypothyroidism -Transition Synthroid to IV until able to tolerate p.o.  COPD -continue duo nebs -Start Pulmicort  CKD stage 2 -stable      Disposition Plan: Patient From: Home/SNF D/C Place: Home/SNF - 2-3  Days Barriers: Not Clinically Stable--  Family Communication:  No Family at bedside  Consultants:  General surgery  Code Status:  FULL   DVT Prophylaxis:  [place] Heparin   Procedures: As Listed in Progress Note Above       Subjective: Pt states abd pain is improving.  Denies f/c, cp, sob, n/v/d abd pain  Objective: Vitals:   08/29/19 0735 08/29/19 0741 08/29/19 1323 08/29/19 1406  BP:    136/74  Pulse:    (!) 102  Resp:    18  Temp:    98.9 F (37.2 C)  TempSrc:    Oral  SpO2: 92% 92% 92% 92%  Weight:      Height:        Intake/Output Summary (Last 24 hours) at 08/29/2019 1516 Last data filed at 08/29/2019 0746 Gross per 24 hour  Intake 922.42 ml  Output 650 ml  Net 272.42 ml   Weight change:  Exam:   General:  Pt is alert, follows commands appropriately, not in acute distress  HEENT: No icterus, No thrush, No neck mass, [place]/AT  Cardiovascular: RRR, S1/S2, no rubs, no gallops  Respiratory: bibasilar rales. No wheeze  Abdomen: Soft/+BS, peribumbilica tender, non distended, no guarding  Extremities: No edema, No lymphangitis, No petechiae, No rashes, no synovitis   Data Reviewed: I have personally reviewed following labs and  imaging studies Basic Metabolic Panel: Recent Labs  Lab 08/26/19 1637 08/27/19 2138 08/28/19 0456 08/29/19 0439  NA 135 136 137 138  K 4.6 4.5 4.5 4.6  CL 102 101 101 103  CO2 26 24 26 27   GLUCOSE 133* 165* 131* 109*  BUN 18 16 16 13   CREATININE 1.10 1.09 1.03 1.11  CALCIUM 8.9 9.4 8.9 8.5*  MG  --   --   --  1.6*  PHOS  --   --   --  3.9   Liver Function Tests: Recent Labs  Lab 08/26/19 1637 08/27/19 2138 08/28/19 0456  AST 14* 12* 11*  ALT 12 12 10   ALKPHOS 63 65 57  BILITOT 0.4 0.4 0.5  PROT 7.6 7.6 6.8  ALBUMIN 3.6 3.7 3.3*   Recent Labs  Lab 08/26/19 1637 08/27/19 2138  LIPASE 25 166*   No results for input(s): AMMONIA in the last 168 hours. Coagulation Profile: No results for input(s): INR, PROTIME in the last 168 hours. CBC: Recent Labs  Lab 08/26/19 1637 08/27/19 2138 08/28/19 0456 08/29/19 0439  WBC 11.7* 15.3* 13.7* 10.9*  NEUTROABS 7.5 12.3*  --   --   HGB 11.1* 12.3* 11.6* 10.8*  HCT 36.6* 39.7 38.8* 37.0*  MCV 92.0 90.4 92.6 94.4  PLT 299 315 288 276   Cardiac Enzymes: No results for input(s): CKTOTAL, CKMB, CKMBINDEX, TROPONINI in the last 168 hours. BNP: Invalid input(s): POCBNP CBG: Recent Labs  Lab 08/28/19 1057 08/28/19 1623 08/28/19 2107 08/29/19 0727 08/29/19 1120  GLUCAP 125* 107* 121* 103* 116*   HbA1C: No results for input(s): HGBA1C in the last 72 hours. Urine analysis:    Component Value Date/Time   COLORURINE YELLOW 08/27/2019 2320   APPEARANCEUR HAZY (A) 08/27/2019 2320   LABSPEC 1.023 08/27/2019 2320   PHURINE 5.0 08/27/2019 2320   GLUCOSEU NEGATIVE 08/27/2019 2320   HGBUR SMALL (A) 08/27/2019 2320   BILIRUBINUR NEGATIVE 08/27/2019 2320   BILIRUBINUR negative 11/13/2014 1640   KETONESUR NEGATIVE 08/27/2019 2320   PROTEINUR 100 (A) 08/27/2019 2320   UROBILINOGEN 0.2 11/13/2014 1640   UROBILINOGEN 0.2 05/09/2013 2015   NITRITE NEGATIVE 08/27/2019 2320   LEUKOCYTESUR SMALL (A) 08/27/2019 2320   Sepsis  Labs: @LABRCNTIP (procalcitonin:4,lacticidven:4) ) Recent Results (from the past 240 hour(s))  Urine culture     Status: Abnormal   Collection Time: 08/26/19  7:43 PM   Specimen: Urine, Clean Catch  Result Value Ref Range Status   Specimen Description   Final    URINE, CLEAN CATCH Performed at [facility], [address] [place], [place] [address]    Special Requests   Final    NONE Performed at [facility], [address] [place], [place] [address]    Culture (A)  Final    >=100,000 COLONIES/mL DIPHTHEROIDS(CORYNEBACTERIUM SPECIES) Standardized susceptibility testing for this organism is not available. Performed at [facility], [place] [address], [place], [place] [address]    Report Status 08/28/2019 FINAL  Final  SARS CORONAVIRUS 2 (Ryonna Cimini 6-24 HRS) Nasopharyngeal Nasopharyngeal Swab     Status:  None   Collection Time: 08/27/19  9:20 PM   Specimen: Nasopharyngeal Swab  Result Value Ref Range Status   SARS Coronavirus 2 NEGATIVE NEGATIVE Final    Comment: (NOTE) SARS-CoV-2 target nucleic acids are NOT DETECTED. The SARS-CoV-2 RNA is generally detectable in upper and lower respiratory specimens during the acute phase of infection. Negative results do not preclude SARS-CoV-2 infection, do not rule out co-infections with other pathogens, and should not be used as the sole basis for treatment or other patient management decisions. Negative results must be combined with clinical observations, patient history, and epidemiological information. The expected result is Negative. Fact Sheet for Patients: SugarRoll.be Fact Sheet for Healthcare Providers: https://www.woods-mathews.com/ This test is not yet approved or cleared by the [place] FDA and  has been authorized for detection and/or diagnosis of SARS-CoV-2 by FDA under an Emergency Use Authorization (EUA). This EUA will remain  in effect (meaning this test can be used) for the  duration of the COVID-19 declaration under Section 56 4(b)(1) of the Act, 21 U.S.C. section 360bbb-3(b)(1), unless the authorization is terminated or revoked sooner. Performed at [facility], [place] [address] [place],  [address]      Scheduled Meds: . aspirin  300 mg Rectal Daily  . budesonide (PULMICORT) nebulizer solution  0.5 mg Nebulization BID  . diatrizoate meglumine-sodium  90 mL Oral Once  . heparin  5,000 Units Subcutaneous Q8H  . insulin aspart  0-15 Units Subcutaneous TID WC  . insulin aspart  0-5 Units Subcutaneous QHS  . ipratropium-albuterol  3 mL Nebulization Q6H WA  . levothyroxine  50 mcg Intravenous Daily  . metoprolol tartrate  2.5 mg Intravenous Q6H  . pantoprazole (PROTONIX) IV  40 mg Intravenous Q24H   Continuous Infusions: . sodium chloride 75 mL/hr at 08/29/19 0746  . levETIRAcetam 500 mg (08/29/19 1035)  . magnesium sulfate bolus IVPB      Procedures/Studies: DG Abd 1 View  Result Date: 08/29/2019 CLINICAL DATA:  Small-bowel obstruction, generalized abdominal pain EXAM: ABDOMEN - 1 VIEW COMPARISON:  Portable exam at 1230 hours compared to abdominal radiograph and CT abdomen/pelvis of 08/27/2019 FINDINGS: Numerous air-filled loops of small bowel throughout abdomen. Scattered small amount of gas in colon. Neuro definite bowel dilatation or bowel wall thickening. Bones demineralized. IMPRESSION: Numerous air-filled normal caliber loops of small bowel throughout abdomen with less colonic gas. Electronically Signed   By: Lavonia Dana M.D.   On: 08/29/2019 12:50   CT ABDOMEN PELVIS W CONTRAST  Result Date: 08/28/2019 CLINICAL DATA:  Abdominal pain and vomiting. EXAM: CT ABDOMEN AND PELVIS WITH CONTRAST TECHNIQUE: Multidetector CT imaging of the abdomen and pelvis was performed using the standard protocol following bolus administration of intravenous contrast. CONTRAST:  164mL OMNIPAQUE IOHEXOL 300 MG/ML  SOLN COMPARISON:  August 26, 2019 FINDINGS: Lower  chest: Mild linear scarring and/or atelectasis is seen within the bilateral lung bases. Hepatobiliary: No focal liver abnormality is seen. No gallstones, gallbladder wall thickening, or biliary dilatation. Pancreas: Unremarkable. No pancreatic ductal dilatation or surrounding inflammatory changes. Spleen: Normal in size without focal abnormality. Adrenals/Urinary Tract: Adrenal glands are unremarkable. Kidneys are normal in size, without renal calculi or hydronephrosis. A 5.4 cm x 4.7 cm simple cyst is seen within the posteromedial aspect of the mid right kidney. A 1.5 cm cyst is seen within the posterolateral aspect of the mid left kidney. Bladder is unremarkable. Stomach/Bowel: Stomach is within normal limits. The appendix is not clearly identified. Multiple dilated small bowel  loops are seen throughout the abdomen and pelvis. A clear transition zone is not identified. Vascular/Lymphatic: Marked severity aortic calcification. No enlarged abdominal or pelvic lymph nodes. Reproductive: Prostate is unremarkable. Other: There is a small amount of perihepatic fluid. A small amount of pelvic free fluid is also seen. Musculoskeletal: Multilevel degenerative changes seen throughout the lumbar spine. IMPRESSION: 1. Multiple dilated small bowel loops throughout the abdomen and pelvis consistent with a distal small bowel obstruction. 2. Small amount of ascites. 3. Bilateral simple renal cysts. Aortic Atherosclerosis (ICD10-I70.0). Electronically Signed   By: Virgina Norfolk M.D.   On: 08/28/2019 00:05   CT ABDOMEN PELVIS W CONTRAST  Result Date: 08/26/2019 CLINICAL DATA:  Bowel obstruction, abdominal distension. EXAM: CT ABDOMEN AND PELVIS WITH CONTRAST TECHNIQUE: Multidetector CT imaging of the abdomen and pelvis was performed using the standard protocol following bolus administration of intravenous contrast. CONTRAST:  123mL OMNIPAQUE IOHEXOL 300 MG/ML  SOLN COMPARISON:  11/15/2009 FINDINGS: Lower chest: Basilar  bronchiectasis and tree-in-bud opacities at the lung bases bilaterally. No significant septal thickening. No consolidation. No pleural effusion. Calcified coronary artery disease likely with prior percutaneous coronary intervention. Heart is incompletely imaged. No pericardial effusion. Hepatobiliary: No focal hepatic lesion. No ductal dilation. No pericholecystic stranding. Pancreas: Pancreas is normal without focal lesion, ductal dilation or peripancreatic inflammation. Spleen: Spleen will size without focal lesion. Adrenals/Urinary Tract: Adrenal glands are normal. Upper pole cyst arising from the right kidney similar to the prior study. No hydronephrosis. Urinary bladder is normal. Small low-density lesion in the left kidney measures water density also likely a small cyst. Renal enhancement otherwise symmetric. Stomach/Bowel: Stomach mildly distended. Small bowel of normal caliber without acute process. Colon is filled with liquid and partially formed stool. Vascular/Lymphatic: Calcified and noncalcified atherosclerotic plaque in the abdominal aorta. No added [place]alie Common in the upper abdomen or in the retroperitoneum. No pelvic lymphadenopathy. Reproductive: Prostate mildly heterogeneous, nonspecific on CT. Other: No free air. No ascites. Musculoskeletal: No acute bone finding or destructive bone process. IMPRESSION: 1. No bowel obstruction. 2. Abundant stool in the colon both liquid and formed. 3. Basilar bronchiectasis and tree-in-bud opacities at the lung bases bilaterally, findings may be related to aspiration or atypical infection with chronicity, correlate with any risk of aspiration. 4. Aortic atherosclerosis. Aortic Atherosclerosis (ICD10-I70.0). Electronically Signed   By: Zetta Bills M.D.   On: 08/26/2019 19:02   DG Abdomen Acute W/Chest  Result Date: 08/27/2019 CLINICAL DATA:  Abdominal pain. EXAM: DG ABDOMEN ACUTE W/ 1V CHEST COMPARISON:  May 18, 2015 FINDINGS: Multiple dilated small bowel  loops are seen overlying the mid abdomen. No radiopaque calculi or other significant radiographic abnormality is seen. Heart size and mediastinal contours are within normal limits. Both lungs are clear. IMPRESSION: Findings consistent with small-bowel obstruction. Electronically Signed   By: Virgina Norfolk M.D.   On: 08/27/2019 23:01    Orson Eva, DO  [facility]  If 7PM-7AM, please contact night-coverage [email or web address] Password [facility] 08/29/2019, 3:16 PM   LOS: 1 day

## 2019-08-29 NOTE — Consult Note (Signed)
[facility] Consult  Reason for Consult: SBO  Referring Physician:  Dr. Carles Collet   Chief Complaint    Abdominal Pain      HPI: [name] is a 79 y.o. male with a SBO seen on CT. The patient was admitted 4/11 with complaints of generalized abdominal pain and nausea vomiting. He reports that he normally has Bms every few days but it had been a few days and he tried miralax without success.  He was seen in the ED the day prior and felt to be constipated and had a normal CT with large stool burden at that time.  He then returned to the ED with worsening pain and vomiting and a repeat CT suggests some degree of obstruction of the distal bowel. He denies any vomiting since Saturday in the ED, and says he is now passing flatus. He has not had a NG placed. He has never had a bowel obstruction in the past. He has as history of an open appendectomy in his 2s. He has a history significant for CAD with stent placement, the last angiography 2018, and stroke in 2017. He says that since his stroke he has not had regular daily Bms.  He says that his pain is improved overall and he is hungry and wants to eat.  He denies any chest pain or SOB and says he is passing flatus.   He was previously on plavix but says he has not been on it in a few months but I do not see where this was stopped per say.  He had an attempted colonoscopy in 2019 with Dr. Laural Golden but this was aborted due to a poor preparation.   Past Medical History:  Diagnosis Date  . Abnormality of gait 05/01/2015  . Anemia 04/19/2015  . Annual physical exam 12/11/2013  . Arthritis    "left leg; knees" (05/07/2017)  . Arthritis of knee   . CAD (coronary artery disease)   . Choking 07/13/2015  . Chronic bronchitis ([place])    "get it q yr"  . Chronic lower back pain    "since I was a teen" (05/07/2017)  . COPD (chronic obstructive pulmonary disease) ([place])   . CVA (cerebral vascular accident) ([place]) 05/2015   R sided weakness  (05/07/2017)  . GERD (gastroesophageal reflux disease)   . Gout   . History of colonic polyps 04/03/2017   Added automatically from request for surgery [id number]  . Hyperlipidemia   . Hypertension   . Myocardial infarction ([place]) 1999  . Obesity   . OSA on CPAP   . Pneumonia    "a few times; last time was ~ 06/2013" (05/07/2017)  . Psoriasis   . Tussive syncope 05/01/2015  . Type II diabetes mellitus ([place])   . Vitamin B12 deficiency 05/01/2015    Past Surgical History:  Procedure Laterality Date  . APPENDECTOMY    . CATARACT EXTRACTION W/PHACO Left 10/07/2016   Procedure: CATARACT EXTRACTION PHACO AND INTRAOCULAR LENS PLACEMENT (IOC);  Surgeon: Rutherford Guys, MD;  Location: [facility];  Service: Ophthalmology;  Laterality: Left;  CDE: 13.49  . CATARACT EXTRACTION W/PHACO Right 10/28/2016   Procedure: CATARACT EXTRACTION PHACO AND INTRAOCULAR LENS PLACEMENT (IOC);  Surgeon: Rutherford Guys, MD;  Location: [facility];  Service: Ophthalmology;  Laterality: Right;  CDE: 16.71  . COLONOSCOPY  03/17/2012   Procedure: COLONOSCOPY;  Surgeon: Rogene Houston, MD;  Location: [facility];  Service: Endoscopy;  Laterality: N/A;  830  . COLONOSCOPY N/A 06/25/2017  Procedure: COLONOSCOPY;  Surgeon: Rogene Houston, MD;  [facility];  Service: Endoscopy;  Laterality: N/A;  [place]- 2009-08/30/2013   "counting today's, I have 5 stents" (08/30/2013)  . CORONARY ANGIOPLASTY WITH STENT PLACEMENT  05/07/2017  . CORONARY STENT INTERVENTION N/A 05/07/2017   Procedure: CORONARY STENT INTERVENTION;  Surgeon: Charolette Forward, MD;  Location: [place] [facility];  Service: Cardiovascular;  Laterality: N/A;  . CYSTOSCOPY WITH URETHRAL DILATATION N/A 11/26/2012   Procedure: CYSTOSCOPY WITH URETHRAL DILATATION;  Surgeon: Malka So, MD;  Location: [facility];  Service: Urology;  Laterality: N/A;  . LEFT HEART CATH AND CORONARY ANGIOGRAPHY N/A 05/07/2017   Procedure: LEFT  HEART CATH AND CORONARY ANGIOGRAPHY;  Surgeon: Charolette Forward, MD;  Location: [place] [facility];  Service: Cardiovascular;  Laterality: N/A;  . LEFT HEART CATHETERIZATION WITH CORONARY ANGIOGRAM N/A 08/30/2013   Procedure: LEFT HEART CATHETERIZATION WITH CORONARY ANGIOGRAM;  Surgeon: Clent Demark, MD;  Location: [place] CATH LAB;  Service: Cardiovascular;  Laterality: N/A;  . PERCUTANEOUS CORONARY STENT INTERVENTION (PCI-S) Right 08/30/2013   Procedure: PERCUTANEOUS CORONARY STENT INTERVENTION (PCI-S);  Surgeon: Clent Demark, MD;  Location: [facility] CATH LAB;  Service: Cardiovascular;  Laterality: Right;    Family History  Problem Relation Age of Onset  . Hypertension Mother   . Diabetes Mother   . Hypertension Father   . Diabetes Brother   . Stroke Daughter   . Arthritis Other        Family History   . Diabetes Other        family History     Social History   Tobacco Use  . Smoking status: Former Smoker    Packs/day: 0.50    Years: 33.00    Pack years: 16.50    Types: Cigarettes    Quit date: 11/29/2002    Years since quitting: 16.7  . Smokeless tobacco: Never Used  Substance Use Topics  . Alcohol use: No  . Drug use: No    Medications:  I have reviewed the patient's current medications. Prior to Admission:  Medications Prior to Admission  Medication Sig Dispense Refill Last Dose  . acetaminophen (TYLENOL) 500 MG tablet Take 1,000 mg by mouth every 6 (six) hours as needed for moderate pain (left thigh).    unknown  . albuterol (VENTOLIN HFA) 108 (90 Base) MCG/ACT inhaler Inhale 1-2 puffs into the lungs every 6 (six) hours as needed for wheezing or shortness of breath. 8 g 1 unknown  . allopurinol (ZYLOPRIM) 300 MG tablet Take 1 tablet (300 mg total) by mouth daily. 90 tablet 1 08/27/2019 at Unknown time  . aspirin EC 81 MG tablet Take 81 mg by mouth daily.   08/27/2019 at Unknown time  . budesonide-formoterol (SYMBICORT) 80-4.5 MCG/ACT inhaler Inhale 2 puffs into the lungs daily.  1 Inhaler 12 08/27/2019 at Unknown time  . cephALEXin (KEFLEX) 500 MG capsule Take 1 capsule (500 mg total) by mouth 3 (three) times daily for 7 days. 21 capsule 0 08/27/2019 at Unknown time  . clopidogrel (PLAVIX) 75 MG tablet TAKE 1 TABLET (75 MG TOTAL) BY MOUTH DAILY. 90 tablet 2 08/27/2019 at Unknown time  . isosorbide mononitrate (IMDUR) 30 MG 24 hr tablet Take 30 mg by mouth daily.   08/27/2019 at Unknown time  . levETIRAcetam (KEPPRA) 500 MG tablet TAKE 1 TABLET TWICE DAILY (Patient taking differently: Take 500 mg by mouth 2 (two) times daily. ) 180  tablet 1 08/27/2019 at 900  . levothyroxine (SYNTHROID) 100 MCG tablet TAKE 1 TABLET (100 MCG TOTAL) BY MOUTH DAILY BEFORE BREAKFAST. 90 tablet 1 08/27/2019 at Unknown time  . metFORMIN (GLUCOPHAGE) 500 MG tablet TAKE 1 TABLET EVERY DAY WITH BREAKFAST (Patient taking differently: Take 500 mg by mouth daily with breakfast. ) 90 tablet 0 08/27/2019 at Unknown time  . metoprolol tartrate (LOPRESSOR) 50 MG tablet TAKE 1/2 TABLET TWICE DAILY (Patient taking differently: Take 25 mg by mouth 2 (two) times daily. ) 90 tablet 1 08/27/2019 at 900  . nitroGLYCERIN (NITROSTAT) 0.4 MG SL tablet Place 1 tablet (0.4 mg total) under the tongue every 5 (five) minutes as needed for chest pain. 100 tablet 3 unknown  . OXYGEN Inhale 2 L into the lungs continuous.   08/27/2019 at Unknown time  . pantoprazole (PROTONIX) 40 MG tablet TAKE 1 TABLET EVERY DAY BEFORE BREAKFAST (Patient taking differently: Take 40 mg by mouth daily. TAKE 1 TABLET EVERY DAY BEFORE BREAKFAST) 90 tablet 2 08/27/2019 at Unknown time  . pravastatin (PRAVACHOL) 40 MG tablet TAKE 2 TABLETS EVERY DAY (Patient taking differently: Take 80 mg by mouth daily. ) 180 tablet 1 08/27/2019 at Unknown time  . spironolactone (ALDACTONE) 25 MG tablet TAKE 1 TABLET EVERY DAY (Patient taking differently: Take 25 mg by mouth daily. ) 90 tablet 1 08/27/2019 at Unknown time  . vitamin B-12 (CYANOCOBALAMIN) 1000 MCG tablet Take  1,000 mcg by mouth daily.   08/27/2019 at Unknown time  . senna (SENOKOT) 8.6 MG tablet Take 1-2 tablets by mouth daily as needed for constipation.    UNKNOWN   Scheduled: . aspirin  300 mg Rectal Daily  . budesonide (PULMICORT) nebulizer solution  0.5 mg Nebulization BID  . diatrizoate meglumine-sodium  90 mL Oral Once  . heparin  5,000 Units Subcutaneous Q8H  . insulin aspart  0-15 Units Subcutaneous TID WC  . insulin aspart  0-5 Units Subcutaneous QHS  . ipratropium-albuterol  3 mL Nebulization Q6H WA  . levothyroxine  50 mcg Intravenous Daily  . metoprolol tartrate  2.5 mg Intravenous Q6H  . pantoprazole (PROTONIX) IV  40 mg Intravenous Q24H   Continuous: . sodium chloride 75 mL/hr at 08/29/19 0746  . levETIRAcetam 500 mg (08/29/19 1035)   [id number] **OR** acetaminophen, albuterol, hydrALAZINE, HYDROmorphone (DILAUDID) injection, nitroGLYCERIN, ondansetron **OR** ondansetron (ZOFRAN) IV  No Known Allergies   ROS:  A comprehensive review of systems was negative except for: Gastrointestinal: positive for abdominal pain, constipation, nausea and bloating   Blood pressure 135/69, pulse 93, temperature 98.5 F (36.9 C), temperature source Oral, resp. rate 20, height 5\' 10"  (1.778 m), weight 117.2 kg, SpO2 92 %. Physical Exam Vitals reviewed.  Constitutional:      Appearance: He is well-developed. He is obese.  HENT:     Head: Normocephalic.  Eyes:     Extraocular Movements: Extraocular movements intact.  Cardiovascular:     Rate and Rhythm: Normal rate.  Pulmonary:     Effort: Pulmonary effort is normal.  Abdominal:     General: There is distension.     Palpations: Abdomen is soft.     Tenderness: There is no abdominal tenderness.     Hernia: A hernia is present. Hernia is present in the umbilical area.     Comments: Right of midline scar, umbilical hernia, soft and reducible, nontender  Musculoskeletal:     Comments: No swelling in lower extremities    Neurological:     General: No  focal deficit present.     Mental Status: He is alert and oriented to person, place, and time.  Psychiatric:        Mood and Affect: Mood normal.        Behavior: Behavior normal.     Results: Results for orders placed or performed during the hospital encounter of 08/27/19 (from the past 48 hour(s))  SARS CORONAVIRUS 2 (TAT 6-24 HRS) Nasopharyngeal Nasopharyngeal Swab     Status: None   Collection Time: 08/27/19  9:20 PM   Specimen: Nasopharyngeal Swab  Result Value Ref Range   SARS Coronavirus 2 NEGATIVE NEGATIVE    Comment: (NOTE) SARS-CoV-2 target nucleic acids are NOT DETECTED. The SARS-CoV-2 RNA is generally detectable in upper and lower respiratory specimens during the acute phase of infection. Negative results do not preclude SARS-CoV-2 infection, do not rule out co-infections with other pathogens, and should not be used as the sole basis for treatment or other patient management decisions. Negative results must be combined with clinical observations, patient history, and epidemiological information. The expected result is Negative. Fact Sheet for Patients: SugarRoll.be Fact Sheet for Healthcare Providers: https://www.woods-mathews.com/ This test is not yet approved or cleared by the [place] FDA and  has been authorized for detection and/or diagnosis of SARS-CoV-2 by FDA under an Emergency Use Authorization (EUA). This EUA will remain  in effect (meaning this test can be used) for the duration of the COVID-19 declaration under Section 56 4(b)(1) of the Act, 21 U.S.C. section 360bbb-3(b)(1), unless the authorization is terminated or revoked sooner. Performed at [facility], [place] [address], [place], [place] [address]   Comprehensive metabolic panel     Status: Abnormal   Collection Time: 08/27/19  9:38 PM  Result Value Ref Range   Sodium 136 135 - 145 mmol/L   Potassium 4.5 3.5 - 5.1  mmol/L   Chloride 101 98 - 111 mmol/L   CO2 24 22 - 32 mmol/L   Glucose, Bld 165 (H) 70 - 99 mg/dL    Comment: Glucose reference range applies only to samples taken after fasting for at least 8 hours.   BUN 16 8 - 23 mg/dL   Creatinine, Ser 1.09 0.61 - 1.24 mg/dL   Calcium 9.4 8.9 - 10.3 mg/dL   Total Protein 7.6 6.5 - 8.1 g/dL   Albumin 3.7 3.5 - 5.0 g/dL   AST 12 (L) 15 - 41 U/L   ALT 12 0 - 44 U/L   Alkaline Phosphatase 65 38 - 126 U/L   Total Bilirubin 0.4 0.3 - 1.2 mg/dL   GFR calc non Af Amer >60 >60 mL/min   GFR calc Af Amer >60 >60 mL/min   Anion gap 11 5 - 15    Comment: Performed at [facility], [address], [place], [place] [address]  Lipase, blood     Status: Abnormal   Collection Time: 08/27/19  9:38 PM  Result Value Ref Range   Lipase 166 (H) 11 - 51 U/L    Comment: Performed at [facility], [address], [place], [place] [address]  CBC with Differential     Status: Abnormal   Collection Time: 08/27/19  9:38 PM  Result Value Ref Range   WBC 15.3 (H) 4.0 - 10.5 K/uL   RBC 4.39 4.22 - 5.81 MIL/uL   Hemoglobin 12.3 (L) 13.0 - 17.0 g/dL   HCT 39.7 39.0 - 52.0 %   MCV 90.4 80.0 - 100.0 fL   MCH 28.0  26.0 - 34.0 pg   MCHC 31.0 30.0 - 36.0 g/dL   RDW 15.3 11.5 - 15.5 %   Platelets 315 150 - 400 K/uL   nRBC 0.0 0.0 - 0.2 %   Neutrophils Relative % 81 %   Neutro Abs 12.3 (H) 1.7 - 7.7 K/uL   Lymphocytes Relative 10 %   Lymphs Abs 1.6 0.7 - 4.0 K/uL   Monocytes Relative 5 %   Monocytes Absolute 0.8 0.1 - 1.0 K/uL   Eosinophils Relative 3 %   Eosinophils Absolute 0.5 0.0 - 0.5 K/uL   Basophils Relative 1 %   Basophils Absolute 0.1 0.0 - 0.1 K/uL   Immature Granulocytes 0 %   Abs Immature Granulocytes 0.06 0.00 - 0.07 K/uL    Comment: Performed at [facility], [address], [place], [place] [address]  Urinalysis, Routine w reflex microscopic     Status: Abnormal   Collection Time: 08/27/19 11:20 PM  Result Value Ref Range   Color, Urine YELLOW YELLOW    APPearance HAZY (A) CLEAR   Specific Gravity, Urine 1.023 1.005 - 1.030   pH 5.0 5.0 - 8.0   Glucose, UA NEGATIVE NEGATIVE mg/dL   Hgb urine dipstick SMALL (A) NEGATIVE   Bilirubin Urine NEGATIVE NEGATIVE   Ketones, ur NEGATIVE NEGATIVE mg/dL   Protein, ur 100 (A) NEGATIVE mg/dL   Nitrite NEGATIVE NEGATIVE   Leukocytes,Ua SMALL (A) NEGATIVE   RBC / HPF 11-20 0 - 5 RBC/hpf   WBC, UA 11-20 0 - 5 WBC/hpf   Bacteria, UA RARE (A) NONE SEEN   Squamous Epithelial / LPF 6-10 0 - 5   Mucus PRESENT    Budding Yeast PRESENT     Comment: Performed at [facility], [address], [place], [place] [address]  Glucose, capillary     Status: Abnormal   Collection Time: 08/28/19  4:45 AM  Result Value Ref Range   Glucose-Capillary 117 (H) 70 - 99 mg/dL    Comment: Glucose reference range applies only to samples taken after fasting for at least 8 hours.  Comprehensive metabolic panel     Status: Abnormal   Collection Time: 08/28/19  4:56 AM  Result Value Ref Range   Sodium 137 135 - 145 mmol/L   Potassium 4.5 3.5 - 5.1 mmol/L   Chloride 101 98 - 111 mmol/L   CO2 26 22 - 32 mmol/L   Glucose, Bld 131 (H) 70 - 99 mg/dL    Comment: Glucose reference range applies only to samples taken after fasting for at least 8 hours.   BUN 16 8 - 23 mg/dL   Creatinine, Ser 1.03 0.61 - 1.24 mg/dL   Calcium 8.9 8.9 - 10.3 mg/dL   Total Protein 6.8 6.5 - 8.1 g/dL   Albumin 3.3 (L) 3.5 - 5.0 g/dL   AST 11 (L) 15 - 41 U/L   ALT 10 0 - 44 U/L   Alkaline Phosphatase 57 38 - 126 U/L   Total Bilirubin 0.5 0.3 - 1.2 mg/dL   GFR calc non Af Amer >60 >60 mL/min   GFR calc Af Amer >60 >60 mL/min   Anion gap 10 5 - 15    Comment: Performed at [facility], [address] [place], [place] [address]  CBC     Status: Abnormal   Collection Time: 08/28/19  4:56 AM  Result Value Ref Range   WBC 13.7 (H) 4.0 - 10.5 K/uL   RBC 4.19 (L) 4.22 - 5.81 MIL/uL  Hemoglobin 11.6 (L) 13.0 - 17.0 g/dL   HCT 38.8 (L) 39.0 - 52.0 %    MCV 92.6 80.0 - 100.0 fL   MCH 27.7 26.0 - 34.0 pg   MCHC 29.9 (L) 30.0 - 36.0 g/dL   RDW 15.4 11.5 - 15.5 %   Platelets 288 150 - 400 K/uL   nRBC 0.0 0.0 - 0.2 %    Comment: Performed at [facility], [address] [place], [place] [address]  Glucose, capillary     Status: Abnormal   Collection Time: 08/28/19  7:35 AM  Result Value Ref Range   Glucose-Capillary 141 (H) 70 - 99 mg/dL    Comment: Glucose reference range applies only to samples taken after fasting for at least 8 hours.  Glucose, capillary     Status: Abnormal   Collection Time: 08/28/19 10:57 AM  Result Value Ref Range   Glucose-Capillary 125 (H) 70 - 99 mg/dL    Comment: Glucose reference range applies only to samples taken after fasting for at least 8 hours.  Glucose, capillary     Status: Abnormal   Collection Time: 08/28/19  4:23 PM  Result Value Ref Range   Glucose-Capillary 107 (H) 70 - 99 mg/dL    Comment: Glucose reference range applies only to samples taken after fasting for at least 8 hours.  Glucose, capillary     Status: Abnormal   Collection Time: 08/28/19  9:07 PM  Result Value Ref Range   Glucose-Capillary 121 (H) 70 - 99 mg/dL    Comment: Glucose reference range applies only to samples taken after fasting for at least 8 hours.   Comment 1 Notify RN    Comment 2 Document in Chart   Basic metabolic panel     Status: Abnormal   Collection Time: 08/29/19  4:39 AM  Result Value Ref Range   Sodium 138 135 - 145 mmol/L   Potassium 4.6 3.5 - 5.1 mmol/L   Chloride 103 98 - 111 mmol/L   CO2 27 22 - 32 mmol/L   Glucose, Bld 109 (H) 70 - 99 mg/dL    Comment: Glucose reference range applies only to samples taken after fasting for at least 8 hours.   BUN 13 8 - 23 mg/dL   Creatinine, Ser 1.11 0.61 - 1.24 mg/dL   Calcium 8.5 (L) 8.9 - 10.3 mg/dL   GFR calc non Af Amer >60 >60 mL/min   GFR calc Af Amer >60 >60 mL/min   Anion gap 8 5 - 15    Comment: Performed at [facility], [address],  [place], [place] [address]  Magnesium     Status: Abnormal   Collection Time: 08/29/19  4:39 AM  Result Value Ref Range   Magnesium 1.6 (L) 1.7 - 2.4 mg/dL    Comment: Performed at [facility], [address], [place], [place] [address]  Phosphorus     Status: None   Collection Time: 08/29/19  4:39 AM  Result Value Ref Range   Phosphorus 3.9 2.5 - 4.6 mg/dL    Comment: Performed at [facility], [address] [place], [place] [address]  CBC     Status: Abnormal   Collection Time: 08/29/19  4:39 AM  Result Value Ref Range   WBC 10.9 (H) 4.0 - 10.5 K/uL   RBC 3.92 (L) 4.22 - 5.81 MIL/uL   Hemoglobin 10.8 (L) 13.0 - 17.0 g/dL   HCT 37.0 (L) 39.0 - 52.0 %   MCV 94.4 80.0 -  100.0 fL   MCH 27.6 26.0 - 34.0 pg   MCHC 29.2 (L) 30.0 - 36.0 g/dL   RDW 15.3 11.5 - 15.5 %   Platelets 276 150 - 400 K/uL   nRBC 0.0 0.0 - 0.2 %    Comment: Performed at [facility], [address] [place], [place] [address]  Glucose, capillary     Status: Abnormal   Collection Time: 08/29/19  7:27 AM  Result Value Ref Range   Glucose-Capillary 103 (H) 70 - 99 mg/dL    Comment: Glucose reference range applies only to samples taken after fasting for at least 8 hours.   Comment 1 Notify RN    Comment 2 Document in Chart    Personally reviewed CT- dilated small bowel loops with tapering bowel in the RLQ /mid right abdomen but no overt transition, small degree of ascites  CT ABDOMEN PELVIS W CONTRAST  Result Date: 08/28/2019 CLINICAL DATA:  Abdominal pain and vomiting. EXAM: CT ABDOMEN AND PELVIS WITH CONTRAST TECHNIQUE: Multidetector CT imaging of the abdomen and pelvis was performed using the standard protocol following bolus administration of intravenous contrast. CONTRAST:  125mL OMNIPAQUE IOHEXOL 300 MG/ML  SOLN COMPARISON:  August 26, 2019 FINDINGS: Lower chest: Mild linear scarring and/or atelectasis is seen within the bilateral lung bases. Hepatobiliary: No focal liver abnormality is seen. No gallstones, gallbladder  wall thickening, or biliary dilatation. Pancreas: Unremarkable. No pancreatic ductal dilatation or surrounding inflammatory changes. Spleen: Normal in size without focal abnormality. Adrenals/Urinary Tract: Adrenal glands are unremarkable. Kidneys are normal in size, without renal calculi or hydronephrosis. A 5.4 cm x 4.7 cm simple cyst is seen within the posteromedial aspect of the mid right kidney. A 1.5 cm cyst is seen within the posterolateral aspect of the mid left kidney. Bladder is unremarkable. Stomach/Bowel: Stomach is within normal limits. The appendix is not clearly identified. Multiple dilated small bowel loops are seen throughout the abdomen and pelvis. A clear transition zone is not identified. Vascular/Lymphatic: Marked severity aortic calcification. No enlarged abdominal or pelvic lymph nodes. Reproductive: Prostate is unremarkable. Other: There is a small amount of perihepatic fluid. A small amount of pelvic free fluid is also seen. Musculoskeletal: Multilevel degenerative changes seen throughout the lumbar spine. IMPRESSION: 1. Multiple dilated small bowel loops throughout the abdomen and pelvis consistent with a distal small bowel obstruction. 2. Small amount of ascites. 3. Bilateral simple renal cysts. Aortic Atherosclerosis (ICD10-I70.0). Electronically Signed   By: Virgina Norfolk M.D.   On: 08/28/2019 00:05   DG Abdomen Acute W/Chest  Result Date: 08/27/2019 CLINICAL DATA:  Abdominal pain. EXAM: DG ABDOMEN ACUTE W/ 1V CHEST COMPARISON:  May 18, 2015 FINDINGS: Multiple dilated small bowel loops are seen overlying the mid abdomen. No radiopaque calculi or other significant radiographic abnormality is seen. Heart size and mediastinal contours are within normal limits. Both lungs are clear. IMPRESSION: Findings consistent with small-bowel obstruction. Electronically Signed   By: Virgina Norfolk M.D.   On: 08/27/2019 23:01     Assessment & Plan:  AQUARIUS GEISSLER is a 79 y.o. male  with a SBO versus on CT that seems to be resolving. He was also admitted and has not had any further vomiting. He otherwise says he is feeling better and wants to eat. He does have a prior open appendectomy scar this is likely from adhesive disease.  He denies any current nausea and is having flatus.    -SBFT with oral gastrografin to see if SBO is resolving  -  Discussed that patient typically improve with medical management/ bowel rest but some require surgery if SBO continue or do not resolve. Discussed that given his cardiac history we would prefer to avoid surgery but if needed we would get cardiology to risk stratify him.  -Replace lytes as needed  -If vomits will need NG   Discussed with Dr. Carles Collet.   All questions were answered to the satisfaction of the patient and family.   Virl Cagey 08/29/2019, 11:14 AM

## 2019-08-30 ENCOUNTER — Inpatient Hospital Stay (HOSPITAL_COMMUNITY): Payer: Medicare HMO

## 2019-08-30 DIAGNOSIS — N182 Chronic kidney disease, stage 2 (mild): Secondary | ICD-10-CM | POA: Diagnosis not present

## 2019-08-30 DIAGNOSIS — K56609 Unspecified intestinal obstruction, unspecified as to partial versus complete obstruction: Secondary | ICD-10-CM | POA: Diagnosis not present

## 2019-08-30 DIAGNOSIS — I10 Essential (primary) hypertension: Secondary | ICD-10-CM | POA: Diagnosis not present

## 2019-08-30 DIAGNOSIS — J9611 Chronic respiratory failure with hypoxia: Secondary | ICD-10-CM | POA: Diagnosis not present

## 2019-08-30 LAB — BASIC METABOLIC PANEL
Anion gap: 8 (ref 5–15)
BUN: 12 mg/dL (ref 8–23)
CO2: 26 mmol/L (ref 22–32)
Calcium: 8.5 mg/dL — ABNORMAL LOW (ref 8.9–10.3)
Chloride: 104 mmol/L (ref 98–111)
Creatinine, Ser: 1.08 mg/dL (ref 0.61–1.24)
GFR calc Af Amer: >60 mL/min (ref >60)
GFR calc non Af Amer: >60 mL/min (ref >60)
Glucose, Bld: 101 mg/dL — ABNORMAL HIGH (ref 70–99)
Potassium: 4.7 mmol/L (ref 3.5–5.1)
Sodium: 138 mmol/L (ref 135–145)

## 2019-08-30 LAB — HEMOGLOBIN A1C
Hgb A1c MFr Bld: 7.7 % — ABNORMAL HIGH (ref 4.8–5.6)
Mean Plasma Glucose: 174 mg/dL

## 2019-08-30 LAB — GLUCOSE, CAPILLARY
Glucose-Capillary: 102 mg/dL — ABNORMAL HIGH (ref 70–99)
Glucose-Capillary: 105 mg/dL — ABNORMAL HIGH (ref 70–99)
Glucose-Capillary: 194 mg/dL — ABNORMAL HIGH (ref 70–99)

## 2019-08-30 LAB — MAGNESIUM: Magnesium: 2 mg/dL (ref 1.7–2.4)

## 2019-08-30 NOTE — Progress Notes (Signed)
Discharge instructions reviewed with patient. Given AVS. Prescriptions sent to his pharmacy. Verbalized understanding of instructions and follow-up. IV site removed, within normal limits. Gauze dressing applied. Patient left floor in stable condition via w/c accompanied by nursing staff.

## 2019-08-30 NOTE — Discharge Summary (Signed)
Physician Discharge Summary  [name] [id number] DOB: 09-17-1940 DOA: 08/27/2019  PCP: Fayrene Helper, MD  Admit date: 08/27/2019 Discharge date: 08/30/2019  Admitted From: Home Disposition:  Home   Recommendations for Outpatient Follow-up:  1. Follow up with PCP in 1-2 weeks 2. Please obtain BMP/CBC in one week   Home Health: YES Equipment/Devices: 2L  Discharge Condition: Stable CODE STATUS: FULL Diet recommendation: Heart Healthy / Carb Modified   Brief/Interim Summary: 79 year old male with a history of diabetes mellitus type 2, CKD stage III, coronary disease, stroke, seizure disorder, COPD, hyperlipidemia, chronic respiratory failure on 2 L, B12 deficiency presenting with 2-day history of abdominal pain that started on 08/26/2019.Patient states that his last bowel movement was on 08/25/2019. He normally does not have to use any type of stool softeners or laxatives to have a bowel movement. Because of worsening abdominal pain he came to the emergency department on 08/26/2019. CT of the abdomen and pelvis was performed at that time that showed abundant stool. He was given MiraLAX and discharged home in stable condition. At home, the patient states that he used 8 capfuls of MiraLAX without successful BM. He continued to have abdominal pain. As result, he presented for further evaluation. He denies any previous history of small bowel obstruction. The patient had 5 episodes of nonbloody emesis on 08/27/2019. He has not passed flatus. In the emergency department, the patient was afebrile hemodynamically stable with oxygen saturation 95% on 2 L. BMP and LFTs were unremarkable. WBC 15.3, hemoglobin 12.3, platelets 288,000. UA showed 11-20 WBC. CT of the abdomen and pelvis on 08/27/2019 showed multiple dilated loops of small bowel without clear transition point. There is a small amount of perihepatic fluid. The patient was placed on IV fluids and bowel rest. General  surgery was consulted to assist with management.  Discharge Diagnoses:  Ileus versus small bowel obstruction -08/27/2019 CT abdomen/pelvis--multiple dilated small bowel loops without clear transition point. Bibasilar scarring. -General surgery consult--discussed with Dr. Alfonse Ras management for now -Remain n.p.o. initially -IV fluids -Judicious IV opioids -Transitioned essential medicines IV if possible -08/29/19--SBFT--numerous air filled normal caliber SB bowel loops with less colonic gase -08/29/19--gastrograffin SBFT @8hr --Interval passage of oral contrast into the colon, excluding high-grade small bowel obstruction. Stable nonspecific gaseous distention of the SB 4/13 AXR--Most of the contrast is now seen in the colon -4/12-4/13--pt had multiple BMs -diet advanced to soft diet which pt tolerated -pt will need to follow up with PCP/GI for his overdue colonoscopy--had attempt in 2019 but failed due to poor prep -Can follow up with PCP  Chronic respiratory failure with hypoxia -Stable on 2 L at home -stable on 2L during hospitalization  Diabetes mellitus type 2 -08/29/19 Hemoglobin A1c--7.7 -NovoLog sliding scale -Reduced dose Lantus during the hospitalization  Essential hypertension -Transition metoprolol to IV initially  Coronary artery disease -Aspirin suppository -No chest pain presently -Restart Imdur when able to take p.o.  Hyperlipidemia -Restart statin when able to tolerate p.o.  Hypothyroidism -Transitioned Synthroid to IV until able to tolerate p.o.  COPD -continue duo nebs -Started Pulmicort  CKD stage 2 -stable -baseline creatinine 1.0-1.2    Discharge Instructions   Allergies as of 08/30/2019   No Known Allergies     Medication List    STOP taking these medications   cephALEXin 500 MG capsule Commonly known as: KEFLEX     TAKE these medications   acetaminophen 500 MG tablet Commonly known as: TYLENOL Take 1,000 mg by  mouth every 6 (  six) hours as needed for moderate pain (left thigh).   albuterol 108 (90 Base) MCG/ACT inhaler Commonly known as: VENTOLIN HFA Inhale 1-2 puffs into the lungs every 6 (six) hours as needed for wheezing or shortness of breath.   allopurinol 300 MG tablet Commonly known as: ZYLOPRIM Take 1 tablet (300 mg total) by mouth daily.   aspirin EC 81 MG tablet Take 81 mg by mouth daily.   budesonide-formoterol 80-4.5 MCG/ACT inhaler Commonly known as: Symbicort Inhale 2 puffs into the lungs daily.   clopidogrel 75 MG tablet Commonly known as: PLAVIX TAKE 1 TABLET (75 MG TOTAL) BY MOUTH DAILY.   isosorbide mononitrate 30 MG 24 hr tablet Commonly known as: IMDUR Take 30 mg by mouth daily.   levETIRAcetam 500 MG tablet Commonly known as: KEPPRA TAKE 1 TABLET TWICE DAILY   levothyroxine 100 MCG tablet Commonly known as: SYNTHROID TAKE 1 TABLET (100 MCG TOTAL) BY MOUTH DAILY BEFORE BREAKFAST.   metFORMIN 500 MG tablet Commonly known as: GLUCOPHAGE TAKE 1 TABLET EVERY DAY WITH BREAKFAST What changed: See the new instructions.   metoprolol tartrate 50 MG tablet Commonly known as: LOPRESSOR TAKE 1/2 TABLET TWICE DAILY   nitroGLYCERIN 0.4 MG SL tablet Commonly known as: Nitrostat Place 1 tablet (0.4 mg total) under the tongue every 5 (five) minutes as needed for chest pain.   NovoLIN 70/30 (70-30) 100 UNIT/ML injection Generic drug: insulin NPH-regular Human Inject 35-45 Units into the skin See admin instructions. INJECT 45 UNITS SUBCUTANEOUSLY WITH BREAKFAST AND 35 UNITS WITH SUPPER (WHEN GLUCOSE IS ABOVE 90)   OXYGEN Inhale 2 L into the lungs continuous.   pantoprazole 40 MG tablet Commonly known as: PROTONIX TAKE 1 TABLET EVERY DAY BEFORE BREAKFAST What changed: See the new instructions.   pravastatin 40 MG tablet Commonly known as: PRAVACHOL TAKE 2 TABLETS EVERY DAY   senna 8.6 MG tablet Commonly known as: SENOKOT Take 1-2 tablets by mouth daily as  needed for constipation.   spironolactone 25 MG tablet Commonly known as: ALDACTONE TAKE 1 TABLET EVERY DAY   vitamin B-12 1000 MCG tablet Commonly known as: CYANOCOBALAMIN Take 1,000 mcg by mouth daily.       No Known Allergies  Consultations:  General surgery   Procedures/Studies: DG Abd 1 View  Result Date: 08/30/2019 CLINICAL DATA:  Small bowel obstruction EXAM: ABDOMEN - 1 VIEW COMPARISON:  August 29, 2019 FINDINGS: There is contrast throughout the colon currently. A small amount of residual contrast is noted in the distal ileum. There remain loops of slightly dilated small bowel without air-fluid levels. No free air evident on this supine examination. Foci of arterial vascular calcification noted in the pelvis. IMPRESSION: Most of the contrast is now seen in the colon. Contrast reaches the rectum. Small amount of contrast remains in the distal small bowel. There remains mildly dilated small bowel which may be indicative of residual degree of partial bowel obstruction or ileus. No evident free air. Electronically Signed   By: Lowella Grip III M.D.   On: 08/30/2019 09:16   DG Abd 1 View  Result Date: 08/29/2019 CLINICAL DATA:  Small-bowel obstruction, generalized abdominal pain EXAM: ABDOMEN - 1 VIEW COMPARISON:  Portable exam at 1230 hours compared to abdominal radiograph and CT abdomen/pelvis of 08/27/2019 FINDINGS: Numerous air-filled loops of small bowel throughout abdomen. Scattered small amount of gas in colon. Neuro definite bowel dilatation or bowel wall thickening. Bones demineralized. IMPRESSION: Numerous air-filled normal caliber loops of small bowel throughout abdomen with less  colonic gas. Electronically Signed   By: Lavonia Dana M.D.   On: 08/29/2019 12:50   CT ABDOMEN PELVIS W CONTRAST  Result Date: 08/28/2019 CLINICAL DATA:  Abdominal pain and vomiting. EXAM: CT ABDOMEN AND PELVIS WITH CONTRAST TECHNIQUE: Multidetector CT imaging of the abdomen and pelvis was  performed using the standard protocol following bolus administration of intravenous contrast. CONTRAST:  153mL OMNIPAQUE IOHEXOL 300 MG/ML  SOLN COMPARISON:  August 26, 2019 FINDINGS: Lower chest: Mild linear scarring and/or atelectasis is seen within the bilateral lung bases. Hepatobiliary: No focal liver abnormality is seen. No gallstones, gallbladder wall thickening, or biliary dilatation. Pancreas: Unremarkable. No pancreatic ductal dilatation or surrounding inflammatory changes. Spleen: Normal in size without focal abnormality. Adrenals/Urinary Tract: Adrenal glands are unremarkable. Kidneys are normal in size, without renal calculi or hydronephrosis. A 5.4 cm x 4.7 cm simple cyst is seen within the posteromedial aspect of the mid right kidney. A 1.5 cm cyst is seen within the posterolateral aspect of the mid left kidney. Bladder is unremarkable. Stomach/Bowel: Stomach is within normal limits. The appendix is not clearly identified. Multiple dilated small bowel loops are seen throughout the abdomen and pelvis. A clear transition zone is not identified. Vascular/Lymphatic: Marked severity aortic calcification. No enlarged abdominal or pelvic lymph nodes. Reproductive: Prostate is unremarkable. Other: There is a small amount of perihepatic fluid. A small amount of pelvic free fluid is also seen. Musculoskeletal: Multilevel degenerative changes seen throughout the lumbar spine. IMPRESSION: 1. Multiple dilated small bowel loops throughout the abdomen and pelvis consistent with a distal small bowel obstruction. 2. Small amount of ascites. 3. Bilateral simple renal cysts. Aortic Atherosclerosis (ICD10-I70.0). Electronically Signed   By: Virgina Norfolk M.D.   On: 08/28/2019 00:05   CT ABDOMEN PELVIS W CONTRAST  Result Date: 08/26/2019 CLINICAL DATA:  Bowel obstruction, abdominal distension. EXAM: CT ABDOMEN AND PELVIS WITH CONTRAST TECHNIQUE: Multidetector CT imaging of the abdomen and pelvis was performed using  the standard protocol following bolus administration of intravenous contrast. CONTRAST:  144mL OMNIPAQUE IOHEXOL 300 MG/ML  SOLN COMPARISON:  11/15/2009 FINDINGS: Lower chest: Basilar bronchiectasis and tree-in-bud opacities at the lung bases bilaterally. No significant septal thickening. No consolidation. No pleural effusion. Calcified coronary artery disease likely with prior percutaneous coronary intervention. Heart is incompletely imaged. No pericardial effusion. Hepatobiliary: No focal hepatic lesion. No ductal dilation. No pericholecystic stranding. Pancreas: Pancreas is normal without focal lesion, ductal dilation or peripancreatic inflammation. Spleen: Spleen will size without focal lesion. Adrenals/Urinary Tract: Adrenal glands are normal. Upper pole cyst arising from the right kidney similar to the prior study. No hydronephrosis. Urinary bladder is normal. Small low-density lesion in the left kidney measures water density also likely a small cyst. Renal enhancement otherwise symmetric. Stomach/Bowel: Stomach mildly distended. Small bowel of normal caliber without acute process. Colon is filled with liquid and partially formed stool. Vascular/Lymphatic: Calcified and noncalcified atherosclerotic plaque in the abdominal aorta. No added Coralie Common in the upper abdomen or in the retroperitoneum. No pelvic lymphadenopathy. Reproductive: Prostate mildly heterogeneous, nonspecific on CT. Other: No free air. No ascites. Musculoskeletal: No acute bone finding or destructive bone process. IMPRESSION: 1. No bowel obstruction. 2. Abundant stool in the colon both liquid and formed. 3. Basilar bronchiectasis and tree-in-bud opacities at the lung bases bilaterally, findings may be related to aspiration or atypical infection with chronicity, correlate with any risk of aspiration. 4. Aortic atherosclerosis. Aortic Atherosclerosis (ICD10-I70.0). Electronically Signed   By: Zetta Bills M.D.   On: 08/26/2019  19:02   DG  Abdomen Acute W/Chest  Result Date: 08/27/2019 CLINICAL DATA:  Abdominal pain. EXAM: DG ABDOMEN ACUTE W/ 1V CHEST COMPARISON:  May 18, 2015 FINDINGS: Multiple dilated small bowel loops are seen overlying the mid abdomen. No radiopaque calculi or other significant radiographic abnormality is seen. Heart size and mediastinal contours are within normal limits. Both lungs are clear. IMPRESSION: Findings consistent with small-bowel obstruction. Electronically Signed   By: Virgina Norfolk M.D.   On: 08/27/2019 23:01   DG Abd Portable 1V-Small Bowel Obstruction Protocol-initial, 8 hr delay  Result Date: 08/29/2019 CLINICAL DATA:  Small-bowel obstruction, abdominal pain EXAM: PORTABLE ABDOMEN - 1 VIEW COMPARISON:  08/29/2019 FINDINGS: 2 supine frontal views of the abdomen and pelvis are obtained, excluding the hemidiaphragms in left flank by collimation. Oral contrast has been administered, and is now seen throughout the colon to the level of the rectum. Continued gas-filled dilated loops of small bowel within the central abdomen. IMPRESSION: 1. Interval passage of oral contrast into the colon, excluding high-grade small bowel obstruction. 2. Stable nonspecific gaseous distention of the small bowel. Electronically Signed   By: Randa Ngo M.D.   On: 08/29/2019 21:22        Discharge Exam: Vitals:   08/30/19 0547 08/30/19 0740  BP: (!) 177/87   Pulse: 99   Resp: 20   Temp: 98.5 F (36.9 C)   SpO2: 95% 92%   Vitals:   08/29/19 2203 08/30/19 0038 08/30/19 0547 08/30/19 0740  BP:  (!) 170/57 (!) 177/87   Pulse:  92 99   Resp:   20   Temp:   98.5 F (36.9 C)   TempSrc:   Oral   SpO2: 94%  95% 92%  Weight:      Height:        General: Pt is alert, awake, not in acute distress Cardiovascular: RRR, S1/S2 +, no rubs, no gallops Respiratory: diminished.  Bibasilar rales. No wheeze Abdominal: Soft, NT, ND, bowel sounds + Extremities: no edema, no cyanosis   The results of  significant diagnostics from this hospitalization (including imaging, microbiology, ancillary and laboratory) are listed below for reference.    Significant Diagnostic Studies: DG Abd 1 View  Result Date: 08/30/2019 CLINICAL DATA:  Small bowel obstruction EXAM: ABDOMEN - 1 VIEW COMPARISON:  August 29, 2019 FINDINGS: There is contrast throughout the colon currently. A small amount of residual contrast is noted in the distal ileum. There remain loops of slightly dilated small bowel without air-fluid levels. No free air evident on this supine examination. Foci of arterial vascular calcification noted in the pelvis. IMPRESSION: Most of the contrast is now seen in the colon. Contrast reaches the rectum. Small amount of contrast remains in the distal small bowel. There remains mildly dilated small bowel which may be indicative of residual degree of partial bowel obstruction or ileus. No evident free air. Electronically Signed   By: Lowella Grip III M.D.   On: 08/30/2019 09:16   DG Abd 1 View  Result Date: 08/29/2019 CLINICAL DATA:  Small-bowel obstruction, generalized abdominal pain EXAM: ABDOMEN - 1 VIEW COMPARISON:  Portable exam at 1230 hours compared to abdominal radiograph and CT abdomen/pelvis of 08/27/2019 FINDINGS: Numerous air-filled loops of small bowel throughout abdomen. Scattered small amount of gas in colon. Neuro definite bowel dilatation or bowel wall thickening. Bones demineralized. IMPRESSION: Numerous air-filled normal caliber loops of small bowel throughout abdomen with less colonic gas. Electronically Signed   By: Crist Infante.D.  On: 08/29/2019 12:50   CT ABDOMEN PELVIS W CONTRAST  Result Date: 08/28/2019 CLINICAL DATA:  Abdominal pain and vomiting. EXAM: CT ABDOMEN AND PELVIS WITH CONTRAST TECHNIQUE: Multidetector CT imaging of the abdomen and pelvis was performed using the standard protocol following bolus administration of intravenous contrast. CONTRAST:  15mL OMNIPAQUE IOHEXOL  300 MG/ML  SOLN COMPARISON:  August 26, 2019 FINDINGS: Lower chest: Mild linear scarring and/or atelectasis is seen within the bilateral lung bases. Hepatobiliary: No focal liver abnormality is seen. No gallstones, gallbladder wall thickening, or biliary dilatation. Pancreas: Unremarkable. No pancreatic ductal dilatation or surrounding inflammatory changes. Spleen: Normal in size without focal abnormality. Adrenals/Urinary Tract: Adrenal glands are unremarkable. Kidneys are normal in size, without renal calculi or hydronephrosis. A 5.4 cm x 4.7 cm simple cyst is seen within the posteromedial aspect of the mid right kidney. A 1.5 cm cyst is seen within the posterolateral aspect of the mid left kidney. Bladder is unremarkable. Stomach/Bowel: Stomach is within normal limits. The appendix is not clearly identified. Multiple dilated small bowel loops are seen throughout the abdomen and pelvis. A clear transition zone is not identified. Vascular/Lymphatic: Marked severity aortic calcification. No enlarged abdominal or pelvic lymph nodes. Reproductive: Prostate is unremarkable. Other: There is a small amount of perihepatic fluid. A small amount of pelvic free fluid is also seen. Musculoskeletal: Multilevel degenerative changes seen throughout the lumbar spine. IMPRESSION: 1. Multiple dilated small bowel loops throughout the abdomen and pelvis consistent with a distal small bowel obstruction. 2. Small amount of ascites. 3. Bilateral simple renal cysts. Aortic Atherosclerosis (ICD10-I70.0). Electronically Signed   By: Virgina Norfolk M.D.   On: 08/28/2019 00:05   CT ABDOMEN PELVIS W CONTRAST  Result Date: 08/26/2019 CLINICAL DATA:  Bowel obstruction, abdominal distension. EXAM: CT ABDOMEN AND PELVIS WITH CONTRAST TECHNIQUE: Multidetector CT imaging of the abdomen and pelvis was performed using the standard protocol following bolus administration of intravenous contrast. CONTRAST:  121mL OMNIPAQUE IOHEXOL 300 MG/ML  SOLN  COMPARISON:  11/15/2009 FINDINGS: Lower chest: Basilar bronchiectasis and tree-in-bud opacities at the lung bases bilaterally. No significant septal thickening. No consolidation. No pleural effusion. Calcified coronary artery disease likely with prior percutaneous coronary intervention. Heart is incompletely imaged. No pericardial effusion. Hepatobiliary: No focal hepatic lesion. No ductal dilation. No pericholecystic stranding. Pancreas: Pancreas is normal without focal lesion, ductal dilation or peripancreatic inflammation. Spleen: Spleen will size without focal lesion. Adrenals/Urinary Tract: Adrenal glands are normal. Upper pole cyst arising from the right kidney similar to the prior study. No hydronephrosis. Urinary bladder is normal. Small low-density lesion in the left kidney measures water density also likely a small cyst. Renal enhancement otherwise symmetric. Stomach/Bowel: Stomach mildly distended. Small bowel of normal caliber without acute process. Colon is filled with liquid and partially formed stool. Vascular/Lymphatic: Calcified and noncalcified atherosclerotic plaque in the abdominal aorta. No added Coralie Common in the upper abdomen or in the retroperitoneum. No pelvic lymphadenopathy. Reproductive: Prostate mildly heterogeneous, nonspecific on CT. Other: No free air. No ascites. Musculoskeletal: No acute bone finding or destructive bone process. IMPRESSION: 1. No bowel obstruction. 2. Abundant stool in the colon both liquid and formed. 3. Basilar bronchiectasis and tree-in-bud opacities at the lung bases bilaterally, findings may be related to aspiration or atypical infection with chronicity, correlate with any risk of aspiration. 4. Aortic atherosclerosis. Aortic Atherosclerosis (ICD10-I70.0). Electronically Signed   By: Zetta Bills M.D.   On: 08/26/2019 19:02   DG Abdomen Acute W/Chest  Result Date: 08/27/2019 CLINICAL DATA:  Abdominal pain. EXAM: DG ABDOMEN ACUTE W/ 1V CHEST COMPARISON:   May 18, 2015 FINDINGS: Multiple dilated small bowel loops are seen overlying the mid abdomen. No radiopaque calculi or other significant radiographic abnormality is seen. Heart size and mediastinal contours are within normal limits. Both lungs are clear. IMPRESSION: Findings consistent with small-bowel obstruction. Electronically Signed   By: Virgina Norfolk M.D.   On: 08/27/2019 23:01   DG Abd Portable 1V-Small Bowel Obstruction Protocol-initial, 8 hr delay  Result Date: 08/29/2019 CLINICAL DATA:  Small-bowel obstruction, abdominal pain EXAM: PORTABLE ABDOMEN - 1 VIEW COMPARISON:  08/29/2019 FINDINGS: 2 supine frontal views of the abdomen and pelvis are obtained, excluding the hemidiaphragms in left flank by collimation. Oral contrast has been administered, and is now seen throughout the colon to the level of the rectum. Continued gas-filled dilated loops of small bowel within the central abdomen. IMPRESSION: 1. Interval passage of oral contrast into the colon, excluding high-grade small bowel obstruction. 2. Stable nonspecific gaseous distention of the small bowel. Electronically Signed   By: Randa Ngo M.D.   On: 08/29/2019 21:22     Microbiology: Recent Results (from the past 240 hour(s))  Urine culture     Status: Abnormal   Collection Time: 08/26/19  7:43 PM   Specimen: Urine, Clean Catch  Result Value Ref Range Status   Specimen Description   Final    URINE, CLEAN CATCH Performed at [facility], [address], [place], [place] [address]    Special Requests   Final    NONE Performed at [facility], [address] [place], [place] [address]    Culture (A)  Final    >=100,000 COLONIES/mL DIPHTHEROIDS(CORYNEBACTERIUM SPECIES) Standardized susceptibility testing for this organism is not available. Performed at [facility], [place] [address] [place], [place] [address]    Report Status 08/28/2019 FINAL  Final  SARS CORONAVIRUS 2 (Mackena Plummer 6-24 HRS) Nasopharyngeal  Nasopharyngeal Swab     Status: None   Collection Time: 08/27/19  9:20 PM   Specimen: Nasopharyngeal Swab  Result Value Ref Range Status   SARS Coronavirus 2 NEGATIVE NEGATIVE Final    Comment: (NOTE) SARS-CoV-2 target nucleic acids are NOT DETECTED. The SARS-CoV-2 RNA is generally detectable in upper and lower respiratory specimens during the acute phase of infection. Negative results do not preclude SARS-CoV-2 infection, do not rule out co-infections with other pathogens, and should not be used as the sole basis for treatment or other patient management decisions. Negative results must be combined with clinical observations, patient history, and epidemiological information. The expected result is Negative. Fact Sheet for Patients: SugarRoll.be Fact Sheet for Healthcare Providers: https://www.woods-mathews.com/ This test is not yet approved or cleared by the [place] FDA and  has been authorized for detection and/or diagnosis of SARS-CoV-2 by FDA under an Emergency Use Authorization (EUA). This EUA will remain  in effect (meaning this test can be used) for the duration of the COVID-19 declaration under Section 56 4(b)(1) of the Act, 21 U.S.C. section 360bbb-3(b)(1), unless the authorization is terminated or revoked sooner. Performed at [facility], [place] [address] [place], [place] [address]      Labs: Basic Metabolic Panel: Recent Labs  Lab 08/26/19 1637 08/26/19 1637 08/27/19 2138 08/27/19 2138 08/28/19 0456 08/28/19 0456 08/29/19 0439 08/30/19 0417  NA 135  --  136  --  137  --  138 138  K 4.6   < > 4.5   < > 4.5   < >  4.6 4.7  CL 102  --  101  --  101  --  103 104  CO2 26  --  24  --  26  --  27 26  GLUCOSE 133*  --  165*  --  131*  --  109* 101*  BUN 18  --  16  --  16  --  13 12  CREATININE 1.10  --  1.09  --  1.03  --  1.11 1.08  CALCIUM 8.9  --  9.4  --  8.9  --  8.5* 8.5*  MG  --   --   --   --   --   --   1.6* 2.0  PHOS  --   --   --   --   --   --  3.9  --    < > = values in this interval not displayed.   Liver Function Tests: Recent Labs  Lab 08/26/19 1637 08/27/19 2138 08/28/19 0456  AST 14* 12* 11*  ALT 12 12 10   ALKPHOS 63 65 57  BILITOT 0.4 0.4 0.5  PROT 7.6 7.6 6.8  ALBUMIN 3.6 3.7 3.3*   Recent Labs  Lab 08/26/19 1637 08/27/19 2138  LIPASE 25 166*   No results for input(s): AMMONIA in the last 168 hours. CBC: Recent Labs  Lab 08/26/19 1637 08/27/19 2138 08/28/19 0456 08/29/19 0439  WBC 11.7* 15.3* 13.7* 10.9*  NEUTROABS 7.5 12.3*  --   --   HGB 11.1* 12.3* 11.6* 10.8*  HCT 36.6* 39.7 38.8* 37.0*  MCV 92.0 90.4 92.6 94.4  PLT 299 315 288 276   Cardiac Enzymes: No results for input(s): CKTOTAL, CKMB, CKMBINDEX, TROPONINI in the last 168 hours. BNP: Invalid input(s): POCBNP CBG: Recent Labs  Lab 08/29/19 1650 08/29/19 2137 08/30/19 0756 08/30/19 0817 08/30/19 1107  GLUCAP 95 91 102* 105* 194*    Time coordinating discharge:  36 minutes  Signed:  Orson Eva, DO [facility] Pager: [phone] 08/30/2019, 11:54 AM

## 2019-08-30 NOTE — Discharge Instructions (Signed)
Bowel Obstruction °A bowel obstruction means that something is blocking the small or large bowel. The bowel is also called the intestine. It is the long tube that connects the stomach to the opening of the butt (anus). When something blocks the bowel, food and fluids cannot pass through like normal. This condition needs to be treated. Treatment depends on the cause of the problem and how bad the problem is. °What are the causes? °Common causes of this condition include: °· Scar tissue (adhesions) from past surgery or from high-energy X-rays (radiation). °· Recent surgery in the belly. This affects how food moves in the bowel. °· Some diseases, such as: °? Irritation of the lining of the digestive tract (Crohn's disease). °? Irritation of small pouches in the bowel (diverticulitis). °· Growths or tumors. °· A bulging organ (hernia). °· Twisting of the bowel (volvulus). °· A foreign body. °· Slipping of a part of the bowel into another part (intussusception). °What are the signs or symptoms? °Symptoms of this condition include: °· Pain in the belly. °· Feeling sick to your stomach (nauseous). °· Throwing up (vomiting). °· Bloating in the belly. °· Being unable to pass gas. °· Trouble pooping (constipation). °· Watery poop (diarrhea). °· A lot of belching. °How is this diagnosed? °This condition may be diagnosed based on: °· A physical exam. °· Medical history. °· Imaging tests, such as X-ray or CT scan. °· Blood tests. °· Urine tests. °How is this treated? °Treatment for this condition may include: °· Fluids and pain medicines that are given through an IV tube. Your doctor may tell you not to eat or drink if you feel sick to your stomach and are throwing up. °· Eating a clear liquid diet for a few days. °· Putting a small tube (nasogastric tube) into the stomach. This will help with pain, discomfort, and nausea by removing blocked air and fluids from the stomach. °· Surgery. This may be needed if other treatments do  not work. °Follow these instructions at home: °Medicines °· Take over-the-counter and prescription medicines only as told by your doctor. °· If you were prescribed an antibiotic medicine, take it as told by your doctor. Do not stop taking the antibiotic even if you start to feel better. °General instructions °· Follow your diet as told by your doctor. You may need to: °? Only drink clear liquids until you start to get better. °? Avoid solid foods. °· Return to your normal activities as told by your doctor. Ask your doctor what activities are safe for you. °· Do not sit for a long time without moving. Get up to take short walks every 1-2 hours. This is important. Ask for help if you feel weak or unsteady. °· Keep all follow-up visits as told by your doctor. This is important. °How is this prevented? °After having a bowel obstruction, you may be more likely to have another. You can do some things to stop it from happening again. °· If you have a long-term (chronic) disease, contact your doctor if you see changes or problems. °· Take steps to prevent or treat trouble pooping. Your doctor may ask that you: °? Drink enough fluid to keep your pee (urine) pale yellow. °? Take over-the-counter or prescription medicines. °? Eat foods that are high in fiber. These include beans, whole grains, and fresh fruits and vegetables. °? Limit foods that are high in fat and sugar. These include fried or sweet foods. °· Stay active. Ask your doctor which exercises are   safe for you. °· Avoid stress. °· Eat three small meals and three small snacks each day. °· Work with a food expert (dietitian) to make a meal plan that works for you. °· Do not use any products that contain nicotine or tobacco, such as cigarettes and e-cigarettes. If you need help quitting, ask your doctor. °Contact a doctor if: °· You have a fever. °· You have chills. °Get help right away if: °· You have pain or cramps that get worse. °· You throw up blood. °· You are  sick to your stomach. °· You cannot stop throwing up. °· You cannot drink fluids. °· You feel mixed up (confused). °· You feel very thirsty (dehydrated). °· Your belly gets more bloated. °· You feel weak or you pass out (faint). °Summary °· A bowel obstruction means that something is blocking the small or large bowel. °· Treatment may include IV fluids and pain medicine. You may also have a clear liquid diet, a small tube in your stomach, or surgery. °· Drink clear liquids and avoid solid foods until you get better. °This information is not intended to replace advice given to you by your health care provider. Make sure you discuss any questions you have with your health care provider. °Document Revised: 09/16/2017 Document Reviewed: 09/16/2017 °Elsevier Patient Education © 2020 [employer] ° °

## 2019-08-30 NOTE — Progress Notes (Signed)
[facility] Progress Note     Subjective: No major complaints. Having flatus and Bms. Xray with contrast in colon and rectum.   Objective: Vital signs in last 24 hours: Temp:  [98.5 F (36.9 C)-98.9 F (37.2 C)] 98.5 F (36.9 C) (04/13 0547) Pulse Rate:  [92-103] 99 (04/13 0547) Resp:  [18-20] 20 (04/13 0547) BP: (130-177)/(57-87) 177/87 (04/13 0547) SpO2:  [92 %-95 %] 92 % (04/13 0740) Last BM Date: 08/29/19  Intake/Output from previous day: 04/12 0701 - 04/13 0700 In: 2898.8 [I.V.:2548.8; IV Piggyback:350] Out: 700 [Urine:700] Intake/Output this shift: Total I/O In: 240 [P.O.:240] Out: -   General appearance: alert, cooperative and no distress Resp: normal work of breathing GI: soft, mildly distended, nontender Extremities: extremities normal, atraumatic, no cyanosis or edema  Lab Results:  Recent Labs    08/28/19 0456 08/29/19 0439  WBC 13.7* 10.9*  HGB 11.6* 10.8*  HCT 38.8* 37.0*  PLT 288 276   BMET Recent Labs    08/29/19 0439 08/30/19 0417  NA 138 138  K 4.6 4.7  CL 103 104  CO2 27 26  GLUCOSE 109* 101*  BUN 13 12  CREATININE 1.11 1.08  CALCIUM 8.5* 8.5*   PT/INR No results for input(s): LABPROT, INR in the last 72 hours.  Personally reviewed Xray Contrast in colon Studies/Results: DG Abd 1 View  Result Date: 08/30/2019 CLINICAL DATA:  Small bowel obstruction EXAM: ABDOMEN - 1 VIEW COMPARISON:  August 29, 2019 FINDINGS: There is contrast throughout the colon currently. A small amount of residual contrast is noted in the distal ileum. There remain loops of slightly dilated small bowel without air-fluid levels. No free air evident on this supine examination. Foci of arterial vascular calcification noted in the pelvis. IMPRESSION: Most of the contrast is now seen in the colon. Contrast reaches the rectum. Small amount of contrast remains in the distal small bowel. There remains mildly dilated small bowel which may be indicative of  residual degree of partial bowel obstruction or ileus. No evident free air. Electronically Signed   By: Lowella Grip III M.D.   On: 08/30/2019 09:16   DG Abd 1 View  Result Date: 08/29/2019 CLINICAL DATA:  Small-bowel obstruction, generalized abdominal pain EXAM: ABDOMEN - 1 VIEW COMPARISON:  Portable exam at 1230 hours compared to abdominal radiograph and CT abdomen/pelvis of 08/27/2019 FINDINGS: Numerous air-filled loops of small bowel throughout abdomen. Scattered small amount of gas in colon. Neuro definite bowel dilatation or bowel wall thickening. Bones demineralized. IMPRESSION: Numerous air-filled normal caliber loops of small bowel throughout abdomen with less colonic gas. Electronically Signed   By: Lavonia Dana M.D.   On: 08/29/2019 12:50   DG Abd Portable 1V-Small Bowel Obstruction Protocol-initial, 8 hr delay  Result Date: 08/29/2019 CLINICAL DATA:  Small-bowel obstruction, abdominal pain EXAM: PORTABLE ABDOMEN - 1 VIEW COMPARISON:  08/29/2019 FINDINGS: 2 supine frontal views of the abdomen and pelvis are obtained, excluding the hemidiaphragms in left flank by collimation. Oral contrast has been administered, and is now seen throughout the colon to the level of the rectum. Continued gas-filled dilated loops of small bowel within the central abdomen. IMPRESSION: 1. Interval passage of oral contrast into the colon, excluding high-grade small bowel obstruction. 2. Stable nonspecific gaseous distention of the small bowel. Electronically Signed   By: Randa Ngo M.D.   On: 08/29/2019 21:22    Anti-infectives: Anti-infectives (From admission, onward)   None      Assessment/Plan: Mr. Faupel is a 79  yo with a resolving SBO that is improving clinically and on Xray.  -Diet as tolerated -Continue Bowel regimen -Patient is overdue to colonoscopy - had attempt in 2019 but failed due to poor prep -Can follow up with PCP  Discussed with Dr. Carles Collet.    LOS: 2 days    Virl Cagey 08/30/2019

## 2019-08-30 NOTE — Progress Notes (Signed)
Physical Therapy Treatment Patient Details Name: [name] MRN: [id number] DOB: Jul 16, 1940 Today's Date: 08/30/2019    History of Present Illness [name] is a 79 y.o. male with medical history significant of hypertension, hyperlipidemia, diabetes mellitus, GERD, cerebrovascular accident presented to ED for evaluation of generalized abdominal pain, distention and vomiting.  Patient was seen for the same problem yesterday and was discharged home after the lab work and CT of abdomen that showed large stool burden without bowel obstruction.  Patient was given MiraLAX but patient states that he used about 8 capfuls of MiraLAX but still have no bowel movement.  Patient states that he is passing some flatus but had at least 5 episodes of nonbilious and nonbloody vomiting today.  Patient has fever, chills, chest pain, shortness of breath and urinary symptoms.    PT Comments    Patient requires assist to transition to seated EOB where he demonstrates good sitting tolerance and ability to complete exercises following verbal cueing and demonstration. He transfers to standing and ambulates with RW and min guard for balance and safety. He is able to ambulate increased distance today without loss of balance. He demonstrates impaired standing/activity tolerance and is limited by fatigue. Patient ended session seated in chair set up for lunch. Patient will benefit from continued physical therapy in hospital and recommended venue below to increase strength, balance, endurance for safe ADLs and gait.   Follow Up Recommendations  SNF;Supervision for mobility/OOB;Supervision - Intermittent     Equipment Recommendations  None recommended by PT    Recommendations for Other Services       Precautions / Restrictions Precautions Precautions: Fall Restrictions Weight Bearing Restrictions: No    Mobility  Bed Mobility Overal bed mobility: Needs Assistance Bed Mobility: Supine to Sit      Supine to sit: Min assist     General bed mobility comments: increased time, labored movement  Transfers Overall transfer level: Needs assistance Equipment used: Rolling walker (2 wheeled) Transfers: Sit to/from [facility] Sit to Stand: Min guard Stand pivot transfers: Min guard       General transfer comment: increased time, labored movement  Ambulation/Gait Ambulation/Gait assistance: Min guard   Assistive device: Rolling walker (2 wheeled) Gait Pattern/deviations: Decreased step length - right;Decreased step length - left;Decreased stride length Gait velocity: decreased   General Gait Details: patient able to ambulate with slow cadence with RW and min guard for safety/balance to ambulate to bathroom   Stairs             Wheelchair Mobility    Modified Rankin (Stroke Patients Only)       Balance Overall balance assessment: Needs assistance Sitting-balance support: Feet supported;No upper extremity supported Sitting balance-Leahy Scale: Good Sitting balance - Comments: seated at bedside   Standing balance support: During functional activity;Bilateral upper extremity supported Standing balance-Leahy Scale: Fair Standing balance comment: using RW, decreased standing tolerance                            Cognition Arousal/Alertness: Awake/alert Behavior During Therapy: WFL for tasks assessed/performed Overall Cognitive Status: Within Functional Limits for tasks assessed                                        Exercises General Exercises - Lower Extremity Long Arc Quad: AROM;Both;20 reps;Seated Hip Flexion/Marching: AROM;Both;20 reps;Seated  Toe Raises: AROM;Both;20 reps;Seated Heel Raises: AROM;Both;20 reps;Seated    General Comments        Pertinent Vitals/Pain Pain Assessment: No/denies pain    Home Living                      Prior Function            PT Goals (current goals can now be  found in the care plan section) Acute Rehab PT Goals Patient Stated Goal: return home with family to assist PT Goal Formulation: With patient/family Time For Goal Achievement: 09/12/19 Potential to Achieve Goals: Good Progress towards PT goals: Progressing toward goals    Frequency    Min 3X/week      PT Plan Current plan remains appropriate    Co-evaluation              AM-PAC PT "6 Clicks" Mobility   Outcome Measure  Help needed turning from your back to your side while in a flat bed without using bedrails?: A Little Help needed moving from lying on your back to sitting on the side of a flat bed without using bedrails?: A Little Help needed moving to and from a bed to a chair (including a wheelchair)?: A Little Help needed standing up from a chair using your arms (e.g., wheelchair or bedside chair)?: A Little Help needed to walk in hospital room?: A Little Help needed climbing 3-5 steps with a railing? : A Lot 6 Click Score: 17    End of Session Equipment Utilized During Treatment: Oxygen;Gait belt Activity Tolerance: Patient tolerated treatment well;Patient limited by fatigue Patient left: in chair;with call bell/phone within reach Nurse Communication: Mobility status PT Visit Diagnosis: Unsteadiness on feet (R26.81);Other abnormalities of gait and mobility (R26.89);Muscle weakness (generalized) (M62.81)     Time: [id number] PT Time Calculation (min) (ACUTE ONLY): 27 min  Charges:  $Therapeutic Exercise: 8-22 mins $Therapeutic Activity: 8-22 mins                     12:32 PM, 08/30/19 Mearl Latin PT, DPT Physical Therapist at [facility]

## 2019-08-31 ENCOUNTER — Other Ambulatory Visit: Payer: Self-pay

## 2019-08-31 ENCOUNTER — Emergency Department (HOSPITAL_COMMUNITY): Payer: Medicare HMO

## 2019-08-31 ENCOUNTER — Emergency Department (HOSPITAL_COMMUNITY)
Admission: EM | Admit: 2019-08-31 | Discharge: 2019-09-01 | Disposition: A | Discharge status: Home / Self Care | Payer: Medicare HMO | Source: Home / Self Care | Attending: Emergency Medicine | Admitting: Emergency Medicine

## 2019-08-31 ENCOUNTER — Encounter (HOSPITAL_COMMUNITY): Payer: Self-pay | Admitting: Emergency Medicine

## 2019-08-31 DIAGNOSIS — Z79899 Other long term (current) drug therapy: Secondary | ICD-10-CM | POA: Insufficient documentation

## 2019-08-31 DIAGNOSIS — J479 Bronchiectasis, uncomplicated: Secondary | ICD-10-CM | POA: Diagnosis present

## 2019-08-31 DIAGNOSIS — I2510 Atherosclerotic heart disease of native coronary artery without angina pectoris: Secondary | ICD-10-CM | POA: Insufficient documentation

## 2019-08-31 DIAGNOSIS — R112 Nausea with vomiting, unspecified: Secondary | ICD-10-CM | POA: Diagnosis not present

## 2019-08-31 DIAGNOSIS — M109 Gout, unspecified: Secondary | ICD-10-CM | POA: Diagnosis present

## 2019-08-31 DIAGNOSIS — E11649 Type 2 diabetes mellitus with hypoglycemia without coma: Secondary | ICD-10-CM | POA: Insufficient documentation

## 2019-08-31 DIAGNOSIS — E162 Hypoglycemia, unspecified: Secondary | ICD-10-CM | POA: Diagnosis not present

## 2019-08-31 DIAGNOSIS — Z87891 Personal history of nicotine dependence: Secondary | ICD-10-CM | POA: Insufficient documentation

## 2019-08-31 DIAGNOSIS — Z7901 Long term (current) use of anticoagulants: Secondary | ICD-10-CM | POA: Insufficient documentation

## 2019-08-31 DIAGNOSIS — Z7982 Long term (current) use of aspirin: Secondary | ICD-10-CM | POA: Insufficient documentation

## 2019-08-31 DIAGNOSIS — E039 Hypothyroidism, unspecified: Secondary | ICD-10-CM | POA: Diagnosis present

## 2019-08-31 DIAGNOSIS — G40909 Epilepsy, unspecified, not intractable, without status epilepticus: Secondary | ICD-10-CM | POA: Diagnosis present

## 2019-08-31 DIAGNOSIS — I700 Atherosclerosis of aorta: Secondary | ICD-10-CM | POA: Diagnosis present

## 2019-08-31 DIAGNOSIS — R140 Abdominal distension (gaseous): Secondary | ICD-10-CM | POA: Diagnosis not present

## 2019-08-31 DIAGNOSIS — R0902 Hypoxemia: Secondary | ICD-10-CM | POA: Diagnosis not present

## 2019-08-31 DIAGNOSIS — Z8673 Personal history of transient ischemic attack (TIA), and cerebral infarction without residual deficits: Secondary | ICD-10-CM | POA: Insufficient documentation

## 2019-08-31 DIAGNOSIS — Z7984 Long term (current) use of oral hypoglycemic drugs: Secondary | ICD-10-CM | POA: Insufficient documentation

## 2019-08-31 DIAGNOSIS — M170 Bilateral primary osteoarthritis of knee: Secondary | ICD-10-CM | POA: Diagnosis present

## 2019-08-31 DIAGNOSIS — K5900 Constipation, unspecified: Secondary | ICD-10-CM | POA: Diagnosis not present

## 2019-08-31 DIAGNOSIS — R933 Abnormal findings on diagnostic imaging of other parts of digestive tract: Secondary | ICD-10-CM | POA: Diagnosis not present

## 2019-08-31 DIAGNOSIS — K567 Ileus, unspecified: Secondary | ICD-10-CM | POA: Diagnosis not present

## 2019-08-31 DIAGNOSIS — I129 Hypertensive chronic kidney disease with stage 1 through stage 4 chronic kidney disease, or unspecified chronic kidney disease: Secondary | ICD-10-CM | POA: Insufficient documentation

## 2019-08-31 DIAGNOSIS — I252 Old myocardial infarction: Secondary | ICD-10-CM | POA: Diagnosis not present

## 2019-08-31 DIAGNOSIS — N182 Chronic kidney disease, stage 2 (mild): Secondary | ICD-10-CM | POA: Insufficient documentation

## 2019-08-31 DIAGNOSIS — R4020 Unspecified coma: Secondary | ICD-10-CM | POA: Diagnosis not present

## 2019-08-31 DIAGNOSIS — K56609 Unspecified intestinal obstruction, unspecified as to partial versus complete obstruction: Secondary | ICD-10-CM | POA: Diagnosis not present

## 2019-08-31 DIAGNOSIS — Z9981 Dependence on supplemental oxygen: Secondary | ICD-10-CM | POA: Diagnosis not present

## 2019-08-31 DIAGNOSIS — E782 Mixed hyperlipidemia: Secondary | ICD-10-CM | POA: Diagnosis present

## 2019-08-31 DIAGNOSIS — Z955 Presence of coronary angioplasty implant and graft: Secondary | ICD-10-CM | POA: Insufficient documentation

## 2019-08-31 DIAGNOSIS — E1122 Type 2 diabetes mellitus with diabetic chronic kidney disease: Secondary | ICD-10-CM | POA: Insufficient documentation

## 2019-08-31 DIAGNOSIS — K219 Gastro-esophageal reflux disease without esophagitis: Secondary | ICD-10-CM | POA: Diagnosis not present

## 2019-08-31 DIAGNOSIS — R935 Abnormal findings on diagnostic imaging of other abdominal regions, including retroperitoneum: Secondary | ICD-10-CM | POA: Diagnosis not present

## 2019-08-31 DIAGNOSIS — I69351 Hemiplegia and hemiparesis following cerebral infarction affecting right dominant side: Secondary | ICD-10-CM | POA: Diagnosis not present

## 2019-08-31 DIAGNOSIS — R1084 Generalized abdominal pain: Secondary | ICD-10-CM | POA: Diagnosis not present

## 2019-08-31 DIAGNOSIS — Z20822 Contact with and (suspected) exposure to covid-19: Secondary | ICD-10-CM | POA: Diagnosis not present

## 2019-08-31 DIAGNOSIS — G4733 Obstructive sleep apnea (adult) (pediatric): Secondary | ICD-10-CM | POA: Diagnosis present

## 2019-08-31 DIAGNOSIS — K5909 Other constipation: Secondary | ICD-10-CM | POA: Diagnosis not present

## 2019-08-31 DIAGNOSIS — I69398 Other sequelae of cerebral infarction: Secondary | ICD-10-CM | POA: Diagnosis not present

## 2019-08-31 DIAGNOSIS — J9611 Chronic respiratory failure with hypoxia: Secondary | ICD-10-CM | POA: Diagnosis not present

## 2019-08-31 DIAGNOSIS — E161 Other hypoglycemia: Secondary | ICD-10-CM | POA: Diagnosis not present

## 2019-08-31 DIAGNOSIS — E1165 Type 2 diabetes mellitus with hyperglycemia: Secondary | ICD-10-CM | POA: Diagnosis not present

## 2019-08-31 DIAGNOSIS — M545 Low back pain: Secondary | ICD-10-CM | POA: Diagnosis present

## 2019-08-31 DIAGNOSIS — R1110 Vomiting, unspecified: Secondary | ICD-10-CM | POA: Diagnosis not present

## 2019-08-31 DIAGNOSIS — E538 Deficiency of other specified B group vitamins: Secondary | ICD-10-CM | POA: Diagnosis present

## 2019-08-31 LAB — CBC WITH DIFFERENTIAL/PLATELET
Abs Immature Granulocytes: 0.02 10*3/uL (ref 0.00–0.07)
Basophils Absolute: 0 10*3/uL (ref 0.0–0.1)
Basophils Relative: 0 %
Eosinophils Absolute: 0.4 10*3/uL (ref 0.0–0.5)
Eosinophils Relative: 5 %
HCT: 33.8 % — ABNORMAL LOW (ref 39.0–52.0)
Hemoglobin: 10.2 g/dL — ABNORMAL LOW (ref 13.0–17.0)
Immature Granulocytes: 0 %
Lymphocytes Relative: 17 %
Lymphs Abs: 1.4 10*3/uL (ref 0.7–4.0)
MCH: 27.8 pg (ref 26.0–34.0)
MCHC: 30.2 g/dL (ref 30.0–36.0)
MCV: 92.1 fL (ref 80.0–100.0)
Monocytes Absolute: 0.8 10*3/uL (ref 0.1–1.0)
Monocytes Relative: 10 %
Neutro Abs: 5.6 10*3/uL (ref 1.7–7.7)
Neutrophils Relative %: 68 %
Platelets: 248 10*3/uL (ref 150–400)
RBC: 3.67 MIL/uL — ABNORMAL LOW (ref 4.22–5.81)
RDW: 15.2 % (ref 11.5–15.5)
WBC: 8.3 10*3/uL (ref 4.0–10.5)
nRBC: 0 % (ref 0.0–0.2)

## 2019-08-31 NOTE — ED Triage Notes (Signed)
Patient brought in by EMS for hypoglycemia. EMS reports CBG was 51 when they arrived. EMS started an IV and gave 25 grams of D/50. Patient's blood sugar then was re-checked by EMS and it was over 200. Patient was seen and discharged from the hospital yesterday for a small bowel obstruction. Patient has had 2 episodes of vomiting today and was told to return to the ED if he had any episodes of vomiting. CBG is 91 currently in the ED. Patient is on 2 liters of oxygen at home and is on 2 liters currently.

## 2019-08-31 NOTE — ED Provider Notes (Addendum)
[facility] EMERGENCY DEPARTMENT Provider Note   CSN: [id number] Arrival date & time: 08/31/19  2318   Time seen 11:38 PM  History Chief Complaint  Patient presents with  . Hypoglycemia    [name] is a 79 y.o. male.  HPI   Patient was admitted to the hospital from April 11 through April 13 for SBO versus ileus.  He was managed medically.  He reports he was told to stay on a light diet.  He states the evening that he was discharged his abdomen was tight and hurting.  He states it was not as bad as when he was admitted however.  He states this morning his abdomen still felt tight and he ate eggs, sausage, and applesauce around 8 AM.  Shortly afterwards he started vomiting and then vomited again about 10 minutes later.  He states about an hour and a half later he vomited and again about 15 minutes after that.  He then had a liquid like water bowel movement.  He states he was doing better and about 3 PM he took his afternoon insulin and then at 330 he ate his lunch which is his usual routine.  He states he ate squash, meatloaf, and string beans.  He states he does not normally eat dinner after that.  His wife had gone to work and when she came home from work she found him on the floor.  He denies knowing how he ended up on the floor.  She told him he was confused and they were unable to get him up off the floor so EMS was called.  EMS reports his initial CBG was 51.  They gave him 25 g of D50.  Repeat CBG was over 200.  Patient is chronically on 2 L/min nasal cannula oxygen.  Patient states currently he has no pain, no nausea, no abdominal tightness but he does feel a little bit lightheaded.  CBG on arrival to the ED was 91.  PCP Fayrene Helper, MD   Past Medical History:  Diagnosis Date  . Abnormality of gait 05/01/2015  . Anemia 04/19/2015  . Annual physical exam 12/11/2013  . Arthritis    "left leg; knees" (05/07/2017)  . Arthritis of knee   . CAD (coronary artery disease)     . Choking 07/13/2015  . Chronic bronchitis ([place])    "get it q yr"  . Chronic lower back pain    "since I was a teen" (05/07/2017)  . COPD (chronic obstructive pulmonary disease) ([place])   . CVA (cerebral vascular accident) ([place]) 05/2015   R sided weakness (05/07/2017)  . GERD (gastroesophageal reflux disease)   . Gout   . History of colonic polyps 04/03/2017   Added automatically from request for surgery [id number]  . Hyperlipidemia   . Hypertension   . Myocardial infarction ([place]) 1999  . Obesity   . OSA on CPAP   . Pneumonia    "a few times; last time was ~ 06/2013" (05/07/2017)  . Psoriasis   . Tussive syncope 05/01/2015  . Type II diabetes mellitus ([place])   . Vitamin B12 deficiency 05/01/2015    Patient Active Problem List   Diagnosis Date Noted  . Small bowel obstruction ([place])   . SBO (small bowel obstruction) ([place]) 08/28/2019  . Nausea and vomiting 08/28/2019  . Hyperglycemia 08/28/2019  . Accelerated hypertension 08/28/2019  . Chronic respiratory failure with hypoxia ([place]) 08/28/2019  . At moderate risk for fall 03/02/2019  . Longitudinal overcurvature  of nails 07/14/2018  . Uncontrolled type 2 diabetes mellitus with hyperglycemia ([place]) 09/07/2017  . New-onset angina ([place]) 05/07/2017  . CKD (chronic kidney disease) stage 2, GFR 60-89 ml/min 06/04/2015  . Hypothyroidism 06/04/2015  . Back pain 06/01/2015  . Partial seizure ([place]) 05/12/2015  . Right hemiparesis ([place]) 05/12/2015  . Stroke ([place]) 05/10/2015  . GERD (gastroesophageal reflux disease) 05/10/2015  . CVA (cerebral infarction) 05/10/2015  . Vitamin B12 deficiency 05/01/2015  . Abnormality of gait 05/01/2015  . Tussive syncope 05/01/2015  . Recurrent falls 04/29/2015  . Dysphagia 04/26/2015  . Carotid bruit 12/08/2013  . Type 2 diabetes mellitus with diabetic nephropathy, with long-term current use of insulin ([place]) 05/22/2013  . COPD (chronic obstructive pulmonary disease) ([place]) 11/22/2012  . Hypoxia  08/30/2012  . Vitamin D deficiency 12/23/2011  . PSORIASIS 07/18/2010  . Gout 01/13/2010  . Mixed hyperlipidemia 10/05/2007  . Morbid obesity ([place]) 10/05/2007  . Essential hypertension 10/05/2007  . Coronary atherosclerosis 10/05/2007  . GERD 10/05/2007  . Arthritis of knee, right 10/05/2007    Past Surgical History:  Procedure Laterality Date  . APPENDECTOMY    . CATARACT EXTRACTION W/PHACO Left 10/07/2016   Procedure: CATARACT EXTRACTION PHACO AND INTRAOCULAR LENS PLACEMENT (IOC);  Surgeon: Rutherford Guys, MD;  Location: [facility] OR[facility];  Service: Ophthalmology;  Laterality: Left;  CDE: 13.49  . CATARACT EXTRACTION W/PHACO Right 10/28/2016   Procedure: CATARACT EXTRACTION PHACO AND INTRAOCULAR LENS PLACEMENT (IOC);  Surgeon: Rutherford Guys, MD;  Location: [facility];  Service: Ophthalmology;  Laterality: Right;  CDE: 16.71  . COLONOSCOPY  03/17/2012   Procedure: COLONOSCOPY;  Surgeon: Rogene Houston, MD;  Location: [facility];  Service: Endoscopy;  Laterality: N/A;  830  . COLONOSCOPY N/A 06/25/2017   Procedure: COLONOSCOPY;  Surgeon: Rogene Houston, MD;  Location: [facility];  Service: Endoscopy;  Laterality: N/A;  [place]- 2009-08/30/2013   "counting today's, I have 5 stents" (08/30/2013)  . CORONARY ANGIOPLASTY WITH STENT PLACEMENT  05/07/2017  . CORONARY STENT INTERVENTION N/A 05/07/2017   Procedure: CORONARY STENT INTERVENTION;  Surgeon: Charolette Forward, MD;  Location: [place] CV LAB;  Service: Cardiovascular;  Laterality: N/A;  . CYSTOSCOPY WITH URETHRAL DILATATION N/A 11/26/2012   Procedure: CYSTOSCOPY WITH URETHRAL DILATATION;  Surgeon: Malka So, MD;  Location: [facility];  Service: Urology;  Laterality: N/A;  . LEFT HEART CATH AND CORONARY ANGIOGRAPHY N/A 05/07/2017   Procedure: LEFT HEART CATH AND CORONARY ANGIOGRAPHY;  Surgeon: Charolette Forward, MD;  Location: [place] CV LAB;  Service: Cardiovascular;  Laterality: N/A;  . LEFT HEART  CATHETERIZATION WITH CORONARY ANGIOGRAM N/A 08/30/2013   Procedure: LEFT HEART CATHETERIZATION WITH CORONARY ANGIOGRAM;  Surgeon: Clent Demark, MD;  Location: [place] CATH LAB;  Service: Cardiovascular;  Laterality: N/A;  . PERCUTANEOUS CORONARY STENT INTERVENTION (PCI-S) Right 08/30/2013   Procedure: PERCUTANEOUS CORONARY STENT INTERVENTION (PCI-S);  Surgeon: Clent Demark, MD;  Location: [place] [facility] CATH LAB;  Service: Cardiovascular;  Laterality: Right;       Family History  Problem Relation Age of Onset  . Hypertension Mother   . Diabetes Mother   . Hypertension Father   . Diabetes Brother   . Stroke Daughter   . Arthritis Other        Family History   . Diabetes Other        family History     Social History   Tobacco Use  . Smoking status: Former Smoker  Packs/day: 0.50    Years: 33.00    Pack years: 16.50    Types: Cigarettes    Quit date: 11/29/2002    Years since quitting: 16.7  . Smokeless tobacco: Never Used  Substance Use Topics  . Alcohol use: No  . Drug use: No  lives at home Lives with spouse  Home Medications Prior to Admission medications   Medication Sig Start Date End Date Taking? Authorizing Provider  acetaminophen (TYLENOL) 500 MG tablet Take 1,000 mg by mouth every 6 (six) hours as needed for moderate pain (left thigh).     [provider]  albuterol (VENTOLIN HFA) 108 (90 Base) MCG/ACT inhaler Inhale 1-2 puffs into the lungs every 6 (six) hours as needed for wheezing or shortness of breath. 07/07/19   Perlie Mayo, NP  allopurinol (ZYLOPRIM) 300 MG tablet Take 1 tablet (300 mg total) by mouth daily. 04/22/19 04/22/20  Fayrene Helper, MD  aspirin EC 81 MG tablet Take 81 mg by mouth daily.    [provider]  budesonide-formoterol (SYMBICORT) 80-4.5 MCG/ACT inhaler Inhale 2 puffs into the lungs daily. 07/07/19   Perlie Mayo, NP  clopidogrel (PLAVIX) 75 MG tablet TAKE 1 TABLET (75 MG TOTAL) BY MOUTH DAILY. 04/25/19   Fayrene Helper, MD  insulin NPH-regular Human (NOVOLIN 70/30) (70-30) 100 UNIT/ML injection Inject 35-45 Units into the skin See admin instructions. INJECT 45 UNITS SUBCUTANEOUSLY WITH BREAKFAST AND 35 UNITS WITH SUPPER (WHEN GLUCOSE IS ABOVE 90) 08/29/19   Nida, Marella Chimes, MD  isosorbide mononitrate (IMDUR) 30 MG 24 hr tablet Take 30 mg by mouth daily. 06/23/19   [provider]  levETIRAcetam (KEPPRA) 500 MG tablet TAKE 1 TABLET TWICE DAILY Patient taking differently: Take 500 mg by mouth 2 (two) times daily.  02/08/19   Fayrene Helper, MD  levothyroxine (SYNTHROID) 100 MCG tablet TAKE 1 TABLET (100 MCG TOTAL) BY MOUTH DAILY BEFORE BREAKFAST. 03/01/19   Cassandria Anger, MD  metFORMIN (GLUCOPHAGE) 500 MG tablet TAKE 1 TABLET EVERY DAY WITH BREAKFAST Patient taking differently: Take 500 mg by mouth daily with breakfast.  08/24/19   Nida, Marella Chimes, MD  metoprolol tartrate (LOPRESSOR) 50 MG tablet TAKE 1/2 TABLET TWICE DAILY 08/29/19   Fayrene Helper, MD  nitroGLYCERIN (NITROSTAT) 0.4 MG SL tablet Place 1 tablet (0.4 mg total) under the tongue every 5 (five) minutes as needed for chest pain. 05/08/17 08/27/19  Charolette Forward, MD  OXYGEN Inhale 2 L into the lungs continuous.    [provider]  pantoprazole (PROTONIX) 40 MG tablet TAKE 1 TABLET EVERY DAY BEFORE BREAKFAST Patient taking differently: Take 40 mg by mouth daily. TAKE 1 TABLET EVERY DAY BEFORE BREAKFAST 04/25/19   Fayrene Helper, MD  pravastatin (PRAVACHOL) 40 MG tablet TAKE 2 TABLETS EVERY DAY Patient taking differently: Take 80 mg by mouth daily.  05/26/19   Fayrene Helper, MD  senna (SENOKOT) 8.6 MG tablet Take 1-2 tablets by mouth daily as needed for constipation.     [provider]  spironolactone (ALDACTONE) 25 MG tablet TAKE 1 TABLET EVERY DAY Patient taking differently: Take 25 mg by mouth daily.  02/08/19   Fayrene Helper, MD  vitamin B-12 (CYANOCOBALAMIN) 1000 MCG tablet Take 1,000  mcg by mouth daily.    [provider]    Allergies    Patient has no known allergies.  Review of Systems   Review of Systems  All other systems reviewed and are  negative.   Physical Exam Updated Vital Signs BP 99/72   Pulse 78   Temp 97.8 F (36.6 C) (Oral)   Resp 19   Ht 5\' 10"  (1.778 m)   Wt 113.4 kg   SpO2 95%   BMI 35.87 kg/m   Physical Exam Vitals and nursing note reviewed.  Constitutional:      General: He is not in acute distress.    Appearance: Normal appearance. He is well-developed. He is not ill-appearing or toxic-appearing.  HENT:     Head: Normocephalic and atraumatic.     Right Ear: External ear normal.     Left Ear: External ear normal.     Nose: Nose normal. No mucosal edema or rhinorrhea.     Mouth/Throat:     Mouth: Mucous membranes are dry.     Dentition: No dental abscesses.     Pharynx: No uvula swelling.  Eyes:     Extraocular Movements: Extraocular movements intact.     Conjunctiva/sclera: Conjunctivae normal.     Pupils: Pupils are equal, round, and reactive to light.  Cardiovascular:     Rate and Rhythm: Normal rate and regular rhythm.     Heart sounds: Normal heart sounds. No murmur. No friction rub. No gallop.   Pulmonary:     Effort: Pulmonary effort is normal. No respiratory distress.     Breath sounds: Normal breath sounds. No wheezing, rhonchi or rales.  Chest:     Chest wall: No tenderness or crepitus.  Abdominal:     General: Bowel sounds are normal. There is distension.     Palpations: Abdomen is soft.     Tenderness: There is no abdominal tenderness. There is no guarding or rebound.  Musculoskeletal:        General: No tenderness. Normal range of motion.     Cervical back: Full passive range of motion without pain, normal range of motion and neck supple.     Right lower leg: No edema.     Left lower leg: No edema.     Comments: Moves all extremities well.   Skin:    General: Skin is warm and dry.      Coloration: Skin is not pale.     Findings: No erythema or rash.  Neurological:     General: No focal deficit present.     Mental Status: He is alert and oriented to person, place, and time.     Cranial Nerves: No cranial nerve deficit.  Psychiatric:        Mood and Affect: Mood normal. Mood is not anxious.        Speech: Speech normal.        Behavior: Behavior normal.        Thought Content: Thought content normal.     ED Results / Procedures / Treatments   Labs (all labs ordered are listed, but only abnormal results are displayed) Results for orders placed or performed during the hospital encounter of [id number]  Basic metabolic panel  Result Value Ref Range   Sodium 135 135 - 145 mmol/L   Potassium 4.0 3.5 - 5.1 mmol/L   Chloride 103 98 - 111 mmol/L   CO2 25 22 - 32 mmol/L   Glucose, Bld 81 70 - 99 mg/dL   BUN 17 8 - 23 mg/dL   Creatinine, Ser 1.39 (H) 0.61 - 1.24 mg/dL   Calcium 8.4 (L) 8.9 - 10.3 mg/dL   GFR calc non Af Amer 49 (L) >60 mL/min  GFR calc Af Amer 56 (L) >60 mL/min   Anion gap 7 5 - 15  CBC with Differential  Result Value Ref Range   WBC 8.3 4.0 - 10.5 K/uL   RBC 3.67 (L) 4.22 - 5.81 MIL/uL   Hemoglobin 10.2 (L) 13.0 - 17.0 g/dL   HCT 33.8 (L) 39.0 - 52.0 %   MCV 92.1 80.0 - 100.0 fL   MCH 27.8 26.0 - 34.0 pg   MCHC 30.2 30.0 - 36.0 g/dL   RDW 15.2 11.5 - 15.5 %   Platelets 248 150 - 400 K/uL   nRBC 0.0 0.0 - 0.2 %   Neutrophils Relative % 68 %   Neutro Abs 5.6 1.7 - 7.7 K/uL   Lymphocytes Relative 17 %   Lymphs Abs 1.4 0.7 - 4.0 K/uL   Monocytes Relative 10 %   Monocytes Absolute 0.8 0.1 - 1.0 K/uL   Eosinophils Relative 5 %   Eosinophils Absolute 0.4 0.0 - 0.5 K/uL   Basophils Relative 0 %   Basophils Absolute 0.0 0.0 - 0.1 K/uL   Immature Granulocytes 0 %   Abs Immature Granulocytes 0.02 0.00 - 0.07 K/uL  CBG monitoring, ED  Result Value Ref Range   Glucose-Capillary 69 (L) 70 - 99 mg/dL  CBG monitoring, ED  Result Value Ref Range    Glucose-Capillary 107 (H) 70 - 99 mg/dL  CBG monitoring, ED  Result Value Ref Range   Glucose-Capillary 109 (H) 70 - 99 mg/dL  CBG monitoring, ED  Result Value Ref Range   Glucose-Capillary 103 (H) 70 - 99 mg/dL  CBG monitoring, ED  Result Value Ref Range   Glucose-Capillary 109 (H) 70 - 99 mg/dL   Laboratory interpretation all normal except hyperglycemia after he arrived in the ED, this improved after treatment, baseline renal insufficiency, stable anemia    EKG None  Radiology   DG Abd Portable 2 Views  Result Date: 09/01/2019 CLINICAL DATA:  Vomiting with small bowel obstruction EXAM: PORTABLE ABDOMEN - 2 VIEW COMPARISON:  None. FINDINGS: Single mildly dilated loop of small bowel in the central abdomen. Bowel gas pattern otherwise unremarkable. Lung bases are clear. IMPRESSION: Single mildly dilated loop of small bowel in the central abdomen. Electronically Signed   By: Ulyses Jarred M.D.   On: 09/01/2019 00:35   DG Abd 1 View  Result Date: 08/30/2019 CLINICAL DATA:  Small bowel obstructionn. Contrast reaches the rectum. Small amount of contrast remains in the distal small bowel. There remains mildly dilated small bowel which may be indicative of residual degree of partial bowel obstruction or ileus. No evident free air. Electronically Signed   By: Lowella Grip III M.D.   On: 08/30/2019 09:16    Procedures .Critical Care Performed by: Rolland Porter, MD Authorized by: Rolland Porter, MD   Critical care provider statement:    Critical care time (minutes):  31   Critical care was necessary to treat or prevent imminent or life-threatening deterioration of the following conditions:  Endocrine crisis   Critical care was time spent personally by me on the following activities:  Examination of patient, ordering and review of laboratory studies and re-evaluation of patient's condition   (including critical care time)  Medications Ordered in ED Medications  dextrose 5 % solution (  Intravenous Stopped 09/01/19 0441)  dextrose 50 % solution 25 mL (25 mLs Intravenous Given 09/01/19 0043)    ED Course  I have reviewed the triage vital signs and the nursing notes.  Pertinent  labs & imaging results that were available during my care of the patient were reviewed by me and considered in my medical decision making (see chart for details).    MDM Rules/Calculators/A&P                      2 view x-ray was done to look for possible obstruction although currently patient appears to be asymptomatic.  He will be observed and do serial CBGs to make sure he does not become hypoglycemic again.  He is no longer nauseated.  After I review his x-ray and it looks okay he will be encouraged to drink oral fluids.  I have asked the nursing staff to give him something to drink.  Patient's CBG dropped to 69 at around 1230.  He was given a half amp of D50 and started on D5 at 100 cc/h.  His x-ray actually looks improved from the one he had prior to discharge from the hospital.  I have asked the nursing staff to give him something to drink.  Recheck at 3:50 AM he denies abdominal pain or tightness.  He denies any nausea.  The person at the bedside said he only drank 3 sips of the drink he was given.  I have talked to the patient that if he wants to go home, which he does, he needs to eat or drink something so we can keep his blood sugar up without keeping him on the D5 drip.  I am not sure how much she understood because he was falling asleep while we were talking.  4:40 AM nurse reports patient pulled his IV out.  He was given food to eat and more things to drink.  His CBG just prior to removing IV was 103.  5:30 AM patient CBG is 109, the D5 has been off about an hour and he has been eating well.  At this point patient can be discharged home.  Final Clinical Impression(s) / ED Diagnoses Final diagnoses:  Hypoglycemia    Rx / DC Orders ED Discharge Orders    None     Plan  discharge  Rolland Porter, MD, Barbette Or, MD 09/01/19 Friona, [place], MD 09/01/19 [phone]

## 2019-09-01 ENCOUNTER — Inpatient Hospital Stay (HOSPITAL_COMMUNITY)
Admission: EM | Admit: 2019-09-01 | Discharge: 2019-09-04 | DRG: 389 | Disposition: A | Discharge status: Home Health | Payer: Medicare HMO | Attending: Internal Medicine | Admitting: Internal Medicine

## 2019-09-01 ENCOUNTER — Encounter (HOSPITAL_COMMUNITY): Payer: Self-pay | Admitting: Emergency Medicine

## 2019-09-01 ENCOUNTER — Other Ambulatory Visit: Payer: Self-pay

## 2019-09-01 ENCOUNTER — Telehealth: Payer: Self-pay | Admitting: *Deleted

## 2019-09-01 DIAGNOSIS — I69398 Other sequelae of cerebral infarction: Secondary | ICD-10-CM

## 2019-09-01 DIAGNOSIS — G4733 Obstructive sleep apnea (adult) (pediatric): Secondary | ICD-10-CM

## 2019-09-01 DIAGNOSIS — G8929 Other chronic pain: Secondary | ICD-10-CM | POA: Diagnosis present

## 2019-09-01 DIAGNOSIS — I10 Essential (primary) hypertension: Secondary | ICD-10-CM | POA: Diagnosis present

## 2019-09-01 DIAGNOSIS — Z86010 Personal history of colonic polyps: Secondary | ICD-10-CM

## 2019-09-01 DIAGNOSIS — Z833 Family history of diabetes mellitus: Secondary | ICD-10-CM

## 2019-09-01 DIAGNOSIS — I700 Atherosclerosis of aorta: Secondary | ICD-10-CM | POA: Diagnosis present

## 2019-09-01 DIAGNOSIS — D649 Anemia, unspecified: Secondary | ICD-10-CM | POA: Diagnosis present

## 2019-09-01 DIAGNOSIS — Z79899 Other long term (current) drug therapy: Secondary | ICD-10-CM

## 2019-09-01 DIAGNOSIS — E11649 Type 2 diabetes mellitus with hypoglycemia without coma: Secondary | ICD-10-CM | POA: Diagnosis present

## 2019-09-01 DIAGNOSIS — M109 Gout, unspecified: Secondary | ICD-10-CM | POA: Diagnosis present

## 2019-09-01 DIAGNOSIS — Z79890 Hormone replacement therapy (postmenopausal): Secondary | ICD-10-CM

## 2019-09-01 DIAGNOSIS — Z8261 Family history of arthritis: Secondary | ICD-10-CM

## 2019-09-01 DIAGNOSIS — E119 Type 2 diabetes mellitus without complications: Secondary | ICD-10-CM

## 2019-09-01 DIAGNOSIS — I639 Cerebral infarction, unspecified: Secondary | ICD-10-CM | POA: Diagnosis present

## 2019-09-01 DIAGNOSIS — K5909 Other constipation: Secondary | ICD-10-CM | POA: Diagnosis present

## 2019-09-01 DIAGNOSIS — R1084 Generalized abdominal pain: Secondary | ICD-10-CM

## 2019-09-01 DIAGNOSIS — Z7951 Long term (current) use of inhaled steroids: Secondary | ICD-10-CM

## 2019-09-01 DIAGNOSIS — Z8719 Personal history of other diseases of the digestive system: Secondary | ICD-10-CM

## 2019-09-01 DIAGNOSIS — J479 Bronchiectasis, uncomplicated: Secondary | ICD-10-CM | POA: Diagnosis present

## 2019-09-01 DIAGNOSIS — Z20822 Contact with and (suspected) exposure to covid-19: Secondary | ICD-10-CM | POA: Diagnosis present

## 2019-09-01 DIAGNOSIS — Z87891 Personal history of nicotine dependence: Secondary | ICD-10-CM

## 2019-09-01 DIAGNOSIS — Z9181 History of falling: Secondary | ICD-10-CM

## 2019-09-01 DIAGNOSIS — E785 Hyperlipidemia, unspecified: Secondary | ICD-10-CM | POA: Diagnosis present

## 2019-09-01 DIAGNOSIS — Z8249 Family history of ischemic heart disease and other diseases of the circulatory system: Secondary | ICD-10-CM

## 2019-09-01 DIAGNOSIS — Z961 Presence of intraocular lens: Secondary | ICD-10-CM | POA: Diagnosis present

## 2019-09-01 DIAGNOSIS — M170 Bilateral primary osteoarthritis of knee: Secondary | ICD-10-CM | POA: Diagnosis present

## 2019-09-01 DIAGNOSIS — J9611 Chronic respiratory failure with hypoxia: Secondary | ICD-10-CM | POA: Diagnosis present

## 2019-09-01 DIAGNOSIS — R296 Repeated falls: Secondary | ICD-10-CM | POA: Diagnosis present

## 2019-09-01 DIAGNOSIS — Z9981 Dependence on supplemental oxygen: Secondary | ICD-10-CM

## 2019-09-01 DIAGNOSIS — I129 Hypertensive chronic kidney disease with stage 1 through stage 4 chronic kidney disease, or unspecified chronic kidney disease: Secondary | ICD-10-CM | POA: Diagnosis present

## 2019-09-01 DIAGNOSIS — I69351 Hemiplegia and hemiparesis following cerebral infarction affecting right dominant side: Secondary | ICD-10-CM

## 2019-09-01 DIAGNOSIS — E782 Mixed hyperlipidemia: Secondary | ICD-10-CM | POA: Diagnosis present

## 2019-09-01 DIAGNOSIS — R109 Unspecified abdominal pain: Secondary | ICD-10-CM | POA: Diagnosis present

## 2019-09-01 DIAGNOSIS — K5900 Constipation, unspecified: Secondary | ICD-10-CM

## 2019-09-01 DIAGNOSIS — Z9841 Cataract extraction status, right eye: Secondary | ICD-10-CM

## 2019-09-01 DIAGNOSIS — Z794 Long term (current) use of insulin: Secondary | ICD-10-CM

## 2019-09-01 DIAGNOSIS — I2510 Atherosclerotic heart disease of native coronary artery without angina pectoris: Secondary | ICD-10-CM | POA: Diagnosis present

## 2019-09-01 DIAGNOSIS — Z7982 Long term (current) use of aspirin: Secondary | ICD-10-CM

## 2019-09-01 DIAGNOSIS — I252 Old myocardial infarction: Secondary | ICD-10-CM

## 2019-09-01 DIAGNOSIS — E039 Hypothyroidism, unspecified: Secondary | ICD-10-CM | POA: Diagnosis present

## 2019-09-01 DIAGNOSIS — Z9842 Cataract extraction status, left eye: Secondary | ICD-10-CM

## 2019-09-01 DIAGNOSIS — Z7902 Long term (current) use of antithrombotics/antiplatelets: Secondary | ICD-10-CM

## 2019-09-01 DIAGNOSIS — Z955 Presence of coronary angioplasty implant and graft: Secondary | ICD-10-CM

## 2019-09-01 DIAGNOSIS — M545 Low back pain: Secondary | ICD-10-CM | POA: Diagnosis present

## 2019-09-01 DIAGNOSIS — Z9049 Acquired absence of other specified parts of digestive tract: Secondary | ICD-10-CM

## 2019-09-01 DIAGNOSIS — K567 Ileus, unspecified: Principal | ICD-10-CM | POA: Diagnosis present

## 2019-09-01 DIAGNOSIS — Z823 Family history of stroke: Secondary | ICD-10-CM

## 2019-09-01 DIAGNOSIS — E538 Deficiency of other specified B group vitamins: Secondary | ICD-10-CM | POA: Diagnosis present

## 2019-09-01 DIAGNOSIS — N182 Chronic kidney disease, stage 2 (mild): Secondary | ICD-10-CM | POA: Diagnosis present

## 2019-09-01 DIAGNOSIS — G40909 Epilepsy, unspecified, not intractable, without status epilepticus: Secondary | ICD-10-CM | POA: Diagnosis present

## 2019-09-01 DIAGNOSIS — E1122 Type 2 diabetes mellitus with diabetic chronic kidney disease: Secondary | ICD-10-CM | POA: Diagnosis present

## 2019-09-01 DIAGNOSIS — Z6837 Body mass index (BMI) 37.0-37.9, adult: Secondary | ICD-10-CM

## 2019-09-01 DIAGNOSIS — K219 Gastro-esophageal reflux disease without esophagitis: Secondary | ICD-10-CM | POA: Diagnosis present

## 2019-09-01 LAB — COMPREHENSIVE METABOLIC PANEL
ALT: 19 U/L (ref 0–44)
AST: 22 U/L (ref 15–41)
Albumin: 3.6 g/dL (ref 3.5–5.0)
Alkaline Phosphatase: 67 U/L (ref 38–126)
Anion gap: 11 (ref 5–15)
BUN: 11 mg/dL (ref 8–23)
CO2: 25 mmol/L (ref 22–32)
Calcium: 9.3 mg/dL (ref 8.9–10.3)
Chloride: 101 mmol/L (ref 98–111)
Creatinine, Ser: 1.1 mg/dL (ref 0.61–1.24)
GFR calc Af Amer: >60 mL/min (ref >60)
GFR calc non Af Amer: >60 mL/min (ref >60)
Glucose, Bld: 126 mg/dL — ABNORMAL HIGH (ref 70–99)
Potassium: 4.4 mmol/L (ref 3.5–5.1)
Sodium: 137 mmol/L (ref 135–145)
Total Bilirubin: 0.7 mg/dL (ref 0.3–1.2)
Total Protein: 7.3 g/dL (ref 6.5–8.1)

## 2019-09-01 LAB — BASIC METABOLIC PANEL
Anion gap: 7 (ref 5–15)
BUN: 17 mg/dL (ref 8–23)
CO2: 25 mmol/L (ref 22–32)
Calcium: 8.4 mg/dL — ABNORMAL LOW (ref 8.9–10.3)
Chloride: 103 mmol/L (ref 98–111)
Creatinine, Ser: 1.39 mg/dL — ABNORMAL HIGH (ref 0.61–1.24)
GFR calc Af Amer: 56 mL/min — ABNORMAL LOW (ref >60)
GFR calc non Af Amer: 49 mL/min — ABNORMAL LOW (ref >60)
Glucose, Bld: 81 mg/dL (ref 70–99)
Potassium: 4 mmol/L (ref 3.5–5.1)
Sodium: 135 mmol/L (ref 135–145)

## 2019-09-01 LAB — CBC
HCT: 38.2 % — ABNORMAL LOW (ref 39.0–52.0)
Hemoglobin: 11.7 g/dL — ABNORMAL LOW (ref 13.0–17.0)
MCH: 27.3 pg (ref 26.0–34.0)
MCHC: 30.6 g/dL (ref 30.0–36.0)
MCV: 89 fL (ref 80.0–100.0)
Platelets: 280 10*3/uL (ref 150–400)
RBC: 4.29 MIL/uL (ref 4.22–5.81)
RDW: 15.1 % (ref 11.5–15.5)
WBC: 10.9 10*3/uL — ABNORMAL HIGH (ref 4.0–10.5)
nRBC: 0 % (ref 0.0–0.2)

## 2019-09-01 LAB — CBG MONITORING, ED
Glucose-Capillary: 103 mg/dL — ABNORMAL HIGH (ref 70–99)
Glucose-Capillary: 107 mg/dL — ABNORMAL HIGH (ref 70–99)
Glucose-Capillary: 109 mg/dL — ABNORMAL HIGH (ref 70–99)
Glucose-Capillary: 109 mg/dL — ABNORMAL HIGH (ref 70–99)
Glucose-Capillary: 69 mg/dL — ABNORMAL LOW (ref 70–99)

## 2019-09-01 LAB — LIPASE, BLOOD: Lipase: 35 U/L (ref 11–51)

## 2019-09-01 LAB — GLUCOSE, CAPILLARY: Glucose-Capillary: 91 mg/dL (ref 70–99)

## 2019-09-01 MED ORDER — SODIUM CHLORIDE 0.9% FLUSH: 3.0000 mL | Freq: Once | INTRAVENOUS | Status: DC

## 2019-09-01 MED ORDER — DEXTROSE 50 % IV SOLN
25.0000 mL | Freq: Once | INTRAVENOUS | Status: AC
  Administered 2019-09-01: 01:00:00 25 mL via INTRAVENOUS
  Filled 2019-09-01: qty 50

## 2019-09-01 MED ORDER — DEXTROSE 5 % IV SOLN: INTRAVENOUS | Status: DC

## 2019-09-01 NOTE — Discharge Instructions (Addendum)
You need to monitor your blood sugar closely and to you are back on your usual diet after having had your bowel obstruction.  Recheck if you get worse.

## 2019-09-01 NOTE — ED Triage Notes (Signed)
Pt c/o generalized abd pain since last Wednesday 9/10 with nausea and vomiting. Pt states he last BM was last week.

## 2019-09-01 NOTE — Telephone Encounter (Signed)
Transition Care Management Follow-up Telephone Call   Date discharged?   08-30-19             How have you been since you were released from the hospital? Doing slightly better but not a lot better     Do you understand why you were in the hospital? yes   Do you understand the discharge instructions? Yes    Where were you discharged to? Home    Items Reviewed:  Medications reviewed:   Allergies reviewed:   Dietary changes reviewed:   Referrals reviewed:    Functional Questionnaire:   Activities of Daily Living (ADLs):  no troubles with this     Any transportation issues/concerns?: none    Any patient concerns? none    Confirmed importance and date/time of follow-up visits scheduled yes      Confirmed with patient if condition begins to worsen call PCP or go to the ER.  Patient was given the office number and encouraged to call back with question or concerns.  :  yes

## 2019-09-01 NOTE — ED Notes (Signed)
Patient given oral fluids. Wife encouraging patient to drink fluids at this time.

## 2019-09-01 NOTE — ED Notes (Signed)
Patient given a sandwich meal and other foods to eat along with a soda to drink.

## 2019-09-02 ENCOUNTER — Encounter (HOSPITAL_COMMUNITY): Payer: Self-pay | Admitting: General Surgery

## 2019-09-02 ENCOUNTER — Emergency Department (HOSPITAL_COMMUNITY): Payer: Medicare HMO

## 2019-09-02 DIAGNOSIS — I129 Hypertensive chronic kidney disease with stage 1 through stage 4 chronic kidney disease, or unspecified chronic kidney disease: Secondary | ICD-10-CM | POA: Diagnosis present

## 2019-09-02 DIAGNOSIS — K219 Gastro-esophageal reflux disease without esophagitis: Secondary | ICD-10-CM | POA: Diagnosis present

## 2019-09-02 DIAGNOSIS — N182 Chronic kidney disease, stage 2 (mild): Secondary | ICD-10-CM | POA: Diagnosis present

## 2019-09-02 DIAGNOSIS — I2510 Atherosclerotic heart disease of native coronary artery without angina pectoris: Secondary | ICD-10-CM | POA: Diagnosis present

## 2019-09-02 DIAGNOSIS — M170 Bilateral primary osteoarthritis of knee: Secondary | ICD-10-CM | POA: Diagnosis present

## 2019-09-02 DIAGNOSIS — M109 Gout, unspecified: Secondary | ICD-10-CM | POA: Diagnosis present

## 2019-09-02 DIAGNOSIS — E11649 Type 2 diabetes mellitus with hypoglycemia without coma: Secondary | ICD-10-CM | POA: Diagnosis present

## 2019-09-02 DIAGNOSIS — Z20822 Contact with and (suspected) exposure to covid-19: Secondary | ICD-10-CM | POA: Diagnosis present

## 2019-09-02 DIAGNOSIS — R1084 Generalized abdominal pain: Secondary | ICD-10-CM | POA: Diagnosis present

## 2019-09-02 DIAGNOSIS — K567 Ileus, unspecified: Secondary | ICD-10-CM | POA: Diagnosis present

## 2019-09-02 DIAGNOSIS — I69351 Hemiplegia and hemiparesis following cerebral infarction affecting right dominant side: Secondary | ICD-10-CM | POA: Diagnosis not present

## 2019-09-02 DIAGNOSIS — K5909 Other constipation: Secondary | ICD-10-CM | POA: Diagnosis present

## 2019-09-02 DIAGNOSIS — E538 Deficiency of other specified B group vitamins: Secondary | ICD-10-CM | POA: Diagnosis present

## 2019-09-02 DIAGNOSIS — K5900 Constipation, unspecified: Secondary | ICD-10-CM

## 2019-09-02 DIAGNOSIS — G4733 Obstructive sleep apnea (adult) (pediatric): Secondary | ICD-10-CM

## 2019-09-02 DIAGNOSIS — I700 Atherosclerosis of aorta: Secondary | ICD-10-CM | POA: Diagnosis present

## 2019-09-02 DIAGNOSIS — R935 Abnormal findings on diagnostic imaging of other abdominal regions, including retroperitoneum: Secondary | ICD-10-CM

## 2019-09-02 DIAGNOSIS — I69398 Other sequelae of cerebral infarction: Secondary | ICD-10-CM | POA: Diagnosis not present

## 2019-09-02 DIAGNOSIS — Z9981 Dependence on supplemental oxygen: Secondary | ICD-10-CM | POA: Diagnosis not present

## 2019-09-02 DIAGNOSIS — G40909 Epilepsy, unspecified, not intractable, without status epilepticus: Secondary | ICD-10-CM | POA: Diagnosis present

## 2019-09-02 DIAGNOSIS — J9611 Chronic respiratory failure with hypoxia: Secondary | ICD-10-CM | POA: Diagnosis present

## 2019-09-02 DIAGNOSIS — R109 Unspecified abdominal pain: Secondary | ICD-10-CM | POA: Diagnosis present

## 2019-09-02 DIAGNOSIS — M545 Low back pain: Secondary | ICD-10-CM | POA: Diagnosis present

## 2019-09-02 DIAGNOSIS — I252 Old myocardial infarction: Secondary | ICD-10-CM | POA: Diagnosis not present

## 2019-09-02 DIAGNOSIS — E039 Hypothyroidism, unspecified: Secondary | ICD-10-CM | POA: Diagnosis present

## 2019-09-02 DIAGNOSIS — J479 Bronchiectasis, uncomplicated: Secondary | ICD-10-CM | POA: Diagnosis present

## 2019-09-02 DIAGNOSIS — E782 Mixed hyperlipidemia: Secondary | ICD-10-CM | POA: Diagnosis present

## 2019-09-02 DIAGNOSIS — Z9989 Dependence on other enabling machines and devices: Secondary | ICD-10-CM

## 2019-09-02 DIAGNOSIS — E1122 Type 2 diabetes mellitus with diabetic chronic kidney disease: Secondary | ICD-10-CM | POA: Diagnosis present

## 2019-09-02 LAB — GLUCOSE, CAPILLARY: Glucose-Capillary: 100 mg/dL — ABNORMAL HIGH (ref 70–99)

## 2019-09-02 LAB — URINALYSIS, ROUTINE W REFLEX MICROSCOPIC
Bilirubin Urine: NEGATIVE
Glucose, UA: NEGATIVE mg/dL
Hgb urine dipstick: NEGATIVE
Ketones, ur: NEGATIVE mg/dL
Leukocytes,Ua: NEGATIVE
Nitrite: NEGATIVE
Protein, ur: NEGATIVE mg/dL
Specific Gravity, Urine: 1.018 (ref 1.005–1.030)
pH: 5 (ref 5.0–8.0)

## 2019-09-02 LAB — MAGNESIUM: Magnesium: 1.6 mg/dL — ABNORMAL LOW (ref 1.7–2.4)

## 2019-09-02 LAB — SARS CORONAVIRUS 2 (TAT 6-24 HRS): SARS Coronavirus 2: NEGATIVE

## 2019-09-02 LAB — CBG MONITORING, ED
Glucose-Capillary: 136 mg/dL — ABNORMAL HIGH (ref 70–99)
Glucose-Capillary: 125 mg/dL — ABNORMAL HIGH (ref 70–99)

## 2019-09-02 LAB — PHOSPHORUS: Phosphorus: 3.4 mg/dL (ref 2.5–4.6)

## 2019-09-02 LAB — LACTIC ACID, PLASMA: Lactic Acid, Venous: 1.2 mmol/L (ref 0.5–1.9)

## 2019-09-02 LAB — TSH: TSH: 10.74 u[IU]/mL — ABNORMAL HIGH (ref 0.350–4.500)

## 2019-09-02 MED ORDER — ISOSORBIDE MONONITRATE ER 30 MG PO TB24
30.0000 mg | ORAL_TABLET | Freq: Every day | ORAL | Status: DC
  Administered 2019-09-02 – 2019-09-04 (×3): 30 mg via ORAL
  Filled 2019-09-02 (×4): qty 1

## 2019-09-02 MED ORDER — INSULIN ASPART 100 UNIT/ML ~~LOC~~ SOLN: 0.0000 [IU] | Freq: Every day | SUBCUTANEOUS | Status: DC

## 2019-09-02 MED ORDER — HYDROMORPHONE HCL 1 MG/ML IJ SOLN: 0.5000 mg | INTRAMUSCULAR | Status: DC | PRN

## 2019-09-02 MED ORDER — METOCLOPRAMIDE HCL 5 MG/ML IJ SOLN
10.0000 mg | Freq: Four times a day (QID) | INTRAMUSCULAR | Status: AC
  Administered 2019-09-02 (×3): 10 mg via INTRAVENOUS
  Filled 2019-09-02 (×3): qty 2

## 2019-09-02 MED ORDER — NITROGLYCERIN 0.4 MG SL SUBL: 0.4000 mg | SUBLINGUAL_TABLET | SUBLINGUAL | Status: DC | PRN

## 2019-09-02 MED ORDER — PEG-KCL-NACL-NASULF-NA ASC-C 100 G PO SOLR
1.0000 | Freq: Once | ORAL | Status: AC
  Administered 2019-09-02: 16:00:00 200 g via ORAL
  Filled 2019-09-02: qty 1

## 2019-09-02 MED ORDER — METOPROLOL TARTRATE 25 MG PO TABS
25.0000 mg | ORAL_TABLET | Freq: Two times a day (BID) | ORAL | Status: DC
  Administered 2019-09-02 – 2019-09-04 (×4): 25 mg via ORAL
  Filled 2019-09-02 (×6): qty 1

## 2019-09-02 MED ORDER — ENOXAPARIN SODIUM 40 MG/0.4ML ~~LOC~~ SOLN: 40.0000 mg | SUBCUTANEOUS | Status: DC

## 2019-09-02 MED ORDER — ACETAMINOPHEN 325 MG PO TABS: 650.0000 mg | ORAL_TABLET | Freq: Four times a day (QID) | ORAL | Status: DC | PRN

## 2019-09-02 MED ORDER — ENOXAPARIN SODIUM 40 MG/0.4ML ~~LOC~~ SOLN
40.0000 mg | SUBCUTANEOUS | Status: DC
  Administered 2019-09-02 – 2019-09-03 (×2): 40 mg via SUBCUTANEOUS
  Filled 2019-09-02 (×2): qty 0.4

## 2019-09-02 MED ORDER — IOHEXOL 300 MG/ML  SOLN
100.0000 mL | Freq: Once | INTRAMUSCULAR | Status: AC | PRN
  Administered 2019-09-02: 10:00:00 100 mL via INTRAVENOUS

## 2019-09-02 MED ORDER — MOMETASONE FURO-FORMOTEROL FUM 100-5 MCG/ACT IN AERO
2.0000 | INHALATION_SPRAY | Freq: Two times a day (BID) | RESPIRATORY_TRACT | Status: DC
  Filled 2019-09-02 (×2): qty 8.8

## 2019-09-02 MED ORDER — LEVETIRACETAM 500 MG PO TABS
500.0000 mg | ORAL_TABLET | Freq: Two times a day (BID) | ORAL | Status: DC
  Administered 2019-09-02 – 2019-09-04 (×5): 500 mg via ORAL
  Filled 2019-09-02 (×5): qty 1

## 2019-09-02 MED ORDER — ONDANSETRON HCL 4 MG PO TABS: 4.0000 mg | ORAL_TABLET | Freq: Four times a day (QID) | ORAL | Status: DC | PRN

## 2019-09-02 MED ORDER — INSULIN ASPART 100 UNIT/ML ~~LOC~~ SOLN
0.0000 [IU] | Freq: Three times a day (TID) | SUBCUTANEOUS | Status: DC
  Administered 2019-09-02: 19:00:00 2 [IU] via SUBCUTANEOUS
  Administered 2019-09-03: 3 [IU] via SUBCUTANEOUS
  Administered 2019-09-03: 18:00:00 2 [IU] via SUBCUTANEOUS

## 2019-09-02 MED ORDER — LEVOTHYROXINE SODIUM 100 MCG PO TABS
100.0000 ug | ORAL_TABLET | Freq: Every day | ORAL | Status: DC
  Administered 2019-09-03 – 2019-09-04 (×2): 100 ug via ORAL
  Filled 2019-09-02 (×2): qty 1

## 2019-09-02 MED ORDER — VITAMIN B-12 1000 MCG PO TABS
1000.0000 ug | ORAL_TABLET | Freq: Every day | ORAL | Status: DC
  Administered 2019-09-02 – 2019-09-04 (×3): 1000 ug via ORAL
  Filled 2019-09-02 (×4): qty 1

## 2019-09-02 MED ORDER — CLOPIDOGREL BISULFATE 75 MG PO TABS
75.0000 mg | ORAL_TABLET | Freq: Every day | ORAL | Status: DC
  Administered 2019-09-02 – 2019-09-04 (×3): 75 mg via ORAL
  Filled 2019-09-02 (×4): qty 1

## 2019-09-02 MED ORDER — SPIRONOLACTONE 25 MG PO TABS
25.0000 mg | ORAL_TABLET | Freq: Every day | ORAL | Status: DC
  Administered 2019-09-02 – 2019-09-04 (×3): 25 mg via ORAL
  Filled 2019-09-02 (×3): qty 1

## 2019-09-02 MED ORDER — ALLOPURINOL 300 MG PO TABS
300.0000 mg | ORAL_TABLET | Freq: Every day | ORAL | Status: DC
  Administered 2019-09-02 – 2019-09-04 (×3): 300 mg via ORAL
  Filled 2019-09-02 (×4): qty 1

## 2019-09-02 MED ORDER — ALBUTEROL SULFATE (2.5 MG/3ML) 0.083% IN NEBU: 2.5000 mg | INHALATION_SOLUTION | Freq: Four times a day (QID) | RESPIRATORY_TRACT | Status: DC | PRN

## 2019-09-02 MED ORDER — ACETAMINOPHEN 650 MG RE SUPP: 650.0000 mg | Freq: Four times a day (QID) | RECTAL | Status: DC | PRN

## 2019-09-02 MED ORDER — BISACODYL 10 MG RE SUPP
10.0000 mg | Freq: Once | RECTAL | Status: AC
  Administered 2019-09-02: 10 mg via RECTAL
  Filled 2019-09-02: qty 1

## 2019-09-02 MED ORDER — ONDANSETRON HCL 4 MG/2ML IJ SOLN: 4.0000 mg | Freq: Four times a day (QID) | INTRAMUSCULAR | Status: DC | PRN

## 2019-09-02 MED ORDER — ASPIRIN EC 81 MG PO TBEC
81.0000 mg | DELAYED_RELEASE_TABLET | Freq: Every day | ORAL | Status: DC
  Administered 2019-09-02 – 2019-09-04 (×3): 81 mg via ORAL
  Filled 2019-09-02 (×3): qty 1

## 2019-09-02 MED ORDER — PRAVASTATIN SODIUM 40 MG PO TABS
80.0000 mg | ORAL_TABLET | Freq: Every day | ORAL | Status: DC
  Administered 2019-09-02 – 2019-09-04 (×3): 80 mg via ORAL
  Filled 2019-09-02 (×4): qty 2

## 2019-09-02 MED ORDER — PANTOPRAZOLE SODIUM 40 MG PO TBEC
40.0000 mg | DELAYED_RELEASE_TABLET | Freq: Every day | ORAL | Status: DC
  Administered 2019-09-02 – 2019-09-04 (×3): 40 mg via ORAL
  Filled 2019-09-02 (×3): qty 1

## 2019-09-02 MED ORDER — SODIUM CHLORIDE 0.9 % IV SOLN: INTRAVENOUS | Status: DC

## 2019-09-02 NOTE — ED Provider Notes (Signed)
[facility] EMERGENCY DEPARTMENT Provider Note   CSN: [id number] Arrival date & time: 09/01/19  2250     History Chief Complaint  Patient presents with  . Abdominal Pain    [name] is a 79 y.o. male.  HPI 79 year old male presents today complaining of abdominal distention, pain, nausea, and vomiting.  Patient has recently been hospitalized for small bowel obstruction versus ileus at any point hospital.  He was then seen again yesterday with ongoing abdominal pain although he had eaten a full breakfast.  He took his usual insulin and then had an episode of hypoglycemia with initial CBG at 50.  He required D50.  After that, he was evaluated and he was felt to be stable for discharge.  He reports that he has had ongoing abdominal pain and came to the [facility] emergency department secondary to this.  He denies fever, headache, change in dyspnea level, although he is dyspneic at baseline due to COPD and wears oxygen.  His abdominal pain is moderate to severe.  He is not report that he is sure when he had a last bowel movement but has been at least a week per his report. His wife came in after my initial history was obtained.  I reviewed with her and she endorses that this is the correct complaints and she has not noted anything different. I have reviewed the chart from yesterday and discharge summary which revealed that he had was admitted on 410 with abdominal pain and had CT that showed multiple dilated small bowel loops that clear transition point.  General surgery was consulted and he was managed nonoperatively.  He eventually had a Gastrografin study that showed passage of oral contrast in the colon and excluded small bowel obstruction.  He had multiple small bowel movements for 12-14 13 and had diet advanced to soft diet which she tolerated and was discharged home    Past Medical History:  Diagnosis Date  . Abnormality of gait 05/01/2015  . Anemia 04/19/2015  .  Annual physical exam 12/11/2013  . Arthritis    "left leg; knees" (05/07/2017)  . Arthritis of knee   . CAD (coronary artery disease)   . Choking 07/13/2015  . Chronic bronchitis ([place])    "get it q yr"  . Chronic lower back pain    "since I was a teen" (05/07/2017)  . COPD (chronic obstructive pulmonary disease) ([place])   . CVA (cerebral vascular accident) ([place]) 05/2015   R sided weakness (05/07/2017)  . GERD (gastroesophageal reflux disease)   . Gout   . History of colonic polyps 04/03/2017   Added automatically from request for surgery [id number]  . Hyperlipidemia   . Hypertension   . Myocardial infarction ([place]) 1999  . Obesity   . OSA on CPAP   . Pneumonia    "a few times; last time was ~ 06/2013" (05/07/2017)  . Psoriasis   . Tussive syncope 05/01/2015  . Type II diabetes mellitus ([place])   . Vitamin B12 deficiency 05/01/2015    Patient Active Problem List   Diagnosis Date Noted  . Small bowel obstruction ([place])   . SBO (small bowel obstruction) ([place]) 08/28/2019  . Nausea and vomiting 08/28/2019  . Hyperglycemia 08/28/2019  . Accelerated hypertension 08/28/2019  . Chronic respiratory failure with hypoxia ([place]) 08/28/2019  . At moderate risk for fall 03/02/2019  . Longitudinal overcurvature of nails 07/14/2018  . Uncontrolled type 2 diabetes mellitus with hyperglycemia ([place]) 09/07/2017  . New-onset  angina ([place]) 05/07/2017  . CKD (chronic kidney disease) stage 2, GFR 60-89 ml/min 06/04/2015  . Hypothyroidism 06/04/2015  . Back pain 06/01/2015  . Partial seizure ([place]) 05/12/2015  . Right hemiparesis ([place]) 05/12/2015  . Stroke ([place]) 05/10/2015  . GERD (gastroesophageal reflux disease) 05/10/2015  . CVA (cerebral infarction) 05/10/2015  . Vitamin B12 deficiency 05/01/2015  . Abnormality of gait 05/01/2015  . Tussive syncope 05/01/2015  . Recurrent falls 04/29/2015  . Dysphagia 04/26/2015  . Carotid bruit 12/08/2013  . Type 2 diabetes mellitus with diabetic nephropathy, with  long-term current use of insulin ([place]) 05/22/2013  . COPD (chronic obstructive pulmonary disease) ([place]) 11/22/2012  . Hypoxia 08/30/2012  . Vitamin D deficiency 12/23/2011  . PSORIASIS 07/18/2010  . Gout 01/13/2010  . Mixed hyperlipidemia 10/05/2007  . Morbid obesity ([place]) 10/05/2007  . Essential hypertension 10/05/2007  . Coronary atherosclerosis 10/05/2007  . GERD 10/05/2007  . Arthritis of knee, right 10/05/2007    Past Surgical History:  Procedure Laterality Date  . APPENDECTOMY    . CATARACT EXTRACTION W/PHACO Left 10/07/2016   Procedure: CATARACT EXTRACTION PHACO AND INTRAOCULAR LENS PLACEMENT (IOC);  Surgeon: Rutherford Guys, MD;  Location: [facility] OR[facility];  Service: Ophthalmology;  Laterality: Left;  CDE: 13.49  . CATARACT EXTRACTION W/PHACO Right 10/28/2016   Procedure: CATARACT EXTRACTION PHACO AND INTRAOCULAR LENS PLACEMENT (IOC);  Surgeon: Rutherford Guys, MD;  Location: [facility] OR[facility];  Service: Ophthalmology;  Laterality: Right;  CDE: 16.71  . COLONOSCOPY  03/17/2012   Procedure: COLONOSCOPY;  Surgeon: Rogene Houston, MD;  Location: [facility] SU[facility];  Service: Endoscopy;  Laterality: N/A;  830  . COLONOSCOPY N/A 06/25/2017   Procedure: COLONOSCOPY;  Surgeon: Rogene Houston, MD;  Location: [facility];  Service: Endoscopy;  Laterality: N/A;  [place]- 2009-08/30/2013   "counting today's, I have 5 stents" (08/30/2013)  . CORONARY ANGIOPLASTY WITH STENT PLACEMENT  05/07/2017  . CORONARY STENT INTERVENTION N/A 05/07/2017   Procedure: CORONARY STENT INTERVENTION;  Surgeon: Charolette Forward, MD;  Location: [facility] CV LAB;  Service: Cardiovascular;  Laterality: N/A;  . CYSTOSCOPY WITH URETHRAL DILATATION N/A 11/26/2012   Procedure: CYSTOSCOPY WITH URETHRAL DILATATION;  Surgeon: Malka So, MD;  Location: [facility];  Service: Urology;  Laterality: N/A;  . LEFT HEART CATH AND CORONARY ANGIOGRAPHY N/A 05/07/2017   Procedure: LEFT HEART CATH AND CORONARY  ANGIOGRAPHY;  Surgeon: Charolette Forward, MD;  Location: [facility] CV LAB;  Service: Cardiovascular;  Laterality: N/A;  . LEFT HEART CATHETERIZATION WITH CORONARY ANGIOGRAM N/A 08/30/2013   Procedure: LEFT HEART CATHETERIZATION WITH CORONARY ANGIOGRAM;  Surgeon: Clent Demark, MD;  Location: [facility] CATH LAB;  Service: Cardiovascular;  Laterality: N/A;  . PERCUTANEOUS CORONARY STENT INTERVENTION (PCI-S) Right 08/30/2013   Procedure: PERCUTANEOUS CORONARY STENT INTERVENTION (PCI-S);  Surgeon: Clent Demark, MD;  Location: [facility] C[facility] LAB;  Service: Cardiovascular;  Laterality: Right;       Family History  Problem Relation Age of Onset  . Hypertension Mother   . Diabetes Mother   . Hypertension Father   . Diabetes Brother   . Stroke Daughter   . Arthritis Other        Family History   . Diabetes Other        family History     Social History   Tobacco Use  . Smoking status: Former Smoker    Packs/day: 0.50    Years: 33.00    Pack years: 16.50  Types: Cigarettes    Quit date: 11/29/2002    Years since quitting: 16.7  . Smokeless tobacco: Never Used  Substance Use Topics  . Alcohol use: No  . Drug use: No    Home Medications Prior to Admission medications   Medication Sig Start Date End Date Taking? Authorizing Provider  acetaminophen (TYLENOL) 500 MG tablet Take 1,000 mg by mouth every 6 (six) hours as needed for moderate pain (left thigh).     [provider]  albuterol (VENTOLIN HFA) 108 (90 Base) MCG/ACT inhaler Inhale 1-2 puffs into the lungs every 6 (six) hours as needed for wheezing or shortness of breath. 07/07/19   Perlie Mayo, NP  allopurinol (ZYLOPRIM) 300 MG tablet Take 1 tablet (300 mg total) by mouth daily. 04/22/19 04/22/20  Fayrene Helper, MD  aspirin EC 81 MG tablet Take 81 mg by mouth daily.    [provider]  budesonide-formoterol (SYMBICORT) 80-4.5 MCG/ACT inhaler Inhale 2 puffs into the lungs daily. 07/07/19   Perlie Mayo, NP    clopidogrel (PLAVIX) 75 MG tablet TAKE 1 TABLET (75 MG TOTAL) BY MOUTH DAILY. 04/25/19   Fayrene Helper, MD  insulin NPH-regular Human (NOVOLIN 70/30) (70-30) 100 UNIT/ML injection Inject 35-45 Units into the skin See admin instructions. INJECT 45 UNITS SUBCUTANEOUSLY WITH BREAKFAST AND 35 UNITS WITH SUPPER (WHEN GLUCOSE IS ABOVE 90) 08/29/19   Nida, Marella Chimes, MD  isosorbide mononitrate (IMDUR) 30 MG 24 hr tablet Take 30 mg by mouth daily. 06/23/19   [provider]  levETIRAcetam (KEPPRA) 500 MG tablet TAKE 1 TABLET TWICE DAILY Patient taking differently: Take 500 mg by mouth 2 (two) times daily.  02/08/19   Fayrene Helper, MD  levothyroxine (SYNTHROID) 100 MCG tablet TAKE 1 TABLET (100 MCG TOTAL) BY MOUTH DAILY BEFORE BREAKFAST. 03/01/19   Cassandria Anger, MD  metFORMIN (GLUCOPHAGE) 500 MG tablet TAKE 1 TABLET EVERY DAY WITH BREAKFAST Patient taking differently: Take 500 mg by mouth daily with breakfast.  08/24/19   Nida, Marella Chimes, MD  metoprolol tartrate (LOPRESSOR) 50 MG tablet TAKE 1/2 TABLET TWICE DAILY 08/29/19   Fayrene Helper, MD  nitroGLYCERIN (NITROSTAT) 0.4 MG SL tablet Place 1 tablet (0.4 mg total) under the tongue every 5 (five) minutes as needed for chest pain. 05/08/17 08/27/19  Charolette Forward, MD  OXYGEN Inhale 2 L into the lungs continuous.    [provider]  pantoprazole (PROTONIX) 40 MG tablet TAKE 1 TABLET EVERY DAY BEFORE BREAKFAST Patient taking differently: Take 40 mg by mouth daily. TAKE 1 TABLET EVERY DAY BEFORE BREAKFAST 04/25/19   Fayrene Helper, MD  pravastatin (PRAVACHOL) 40 MG tablet TAKE 2 TABLETS EVERY DAY Patient taking differently: Take 80 mg by mouth daily.  05/26/19   Fayrene Helper, MD  senna (SENOKOT) 8.6 MG tablet Take 1-2 tablets by mouth daily as needed for constipation.     [provider]  spironolactone (ALDACTONE) 25 MG tablet TAKE 1 TABLET EVERY DAY Patient taking differently: Take 25 mg by  mouth daily.  02/08/19   Fayrene Helper, MD  vitamin B-12 (CYANOCOBALAMIN) 1000 MCG tablet Take 1,000 mcg by mouth daily.    [provider]    Allergies    Patient has no known allergies.  Review of Systems   Review of Systems  Constitutional: Positive for appetite change.  Gastrointestinal: Positive for abdominal distention, abdominal pain, constipation, nausea and vomiting. Negative for anal bleeding, blood in stool, diarrhea and  rectal pain.  All other systems reviewed and are negative.   Physical Exam Updated Vital Signs BP (!) 157/73 (BP Location: Right Arm)   Pulse (!) 107   Temp 98.2 F (36.8 C) (Oral)   Resp 18   Ht 1.778 m (5\' 10" )   Wt 115.7 kg   SpO2 95%   BMI 36.59 kg/m   Physical Exam Vitals and nursing note reviewed.  Constitutional:      General: He is not in acute distress.    Appearance: He is well-developed. He is obese. He is ill-appearing.  HENT:     Head: Normocephalic and atraumatic.     Mouth/Throat:     Mouth: Mucous membranes are moist.  Eyes:     Extraocular Movements: Extraocular movements intact.  Cardiovascular:     Rate and Rhythm: Normal rate and regular rhythm.  Pulmonary:     Effort: Pulmonary effort is normal.  Abdominal:     General: Abdomen is protuberant. Bowel sounds are decreased. There is no distension.     Palpations: Abdomen is soft.     Tenderness: There is generalized abdominal tenderness.     Hernia: No hernia is present.  Skin:    General: Skin is warm and dry.     Capillary Refill: Capillary refill takes less than 2 seconds.  Neurological:     General: No focal deficit present.     Mental Status: He is alert.  Psychiatric:        Mood and Affect: Mood normal.        Behavior: Behavior normal.     ED Results / Procedures / Treatments   Labs (all labs ordered are listed, but only abnormal results are displayed) Labs Reviewed  COMPREHENSIVE METABOLIC PANEL - Abnormal; Notable for the following  components:      Result Value   Glucose, Bld 126 (*)    All other components within normal limits  CBC - Abnormal; Notable for the following components:   WBC 10.9 (*)    Hemoglobin 11.7 (*)    HCT 38.2 (*)    All other components within normal limits  LIPASE, BLOOD  URINALYSIS, ROUTINE W REFLEX MICROSCOPIC    EKG None  Radiology DG Abd Portable 2 Views  Result Date: 09/01/2019 CLINICAL DATA:  Vomiting with small bowel obstruction EXAM: PORTABLE ABDOMEN - 2 VIEW COMPARISON:  None. FINDINGS: Single mildly dilated loop of small bowel in the central abdomen. Bowel gas pattern otherwise unremarkable. Lung bases are clear. IMPRESSION: Single mildly dilated loop of small bowel in the central abdomen. Electronically Signed   By: Ulyses Jarred M.D.   On: 09/01/2019 00:35    Procedures Procedures (including critical care time)  Medications Ordered in ED Medications  sodium chloride flush (NS) 0.9 % injection 3 mL (has no administration in time range)    ED Course  I have reviewed the triage vital signs and the nursing notes.  Pertinent labs & imaging results that were available during my care of the patient were reviewed by me and considered in my medical decision making (see chart for details).    MDM Rules/Calculators/A&P                      Abdominal pain with abdominal distention.  Patient with recent admission for possible SBO versus ileus.  Evaluation here today is more consistent with ileus.  He has decreased small bowel loops and no definite zone of transition.  Patient care discussed  with surgery who requested the consult of the did not think patient required any acute surgical intervention by history or review of films.  Also discussed with gastroenterology and they will see in consult.  Upon reevaluation the patient feels somewhat improved.  It may be that he is able to take P.O. and be discharged.  Will reassess after gastroenterology consult. Discussed with  gastroenterology and they advise admission given the patient's having ongoing pain and has not had a bowel movement.  Will discuss with admitting team, discussed with Dr. Doristine Bosworth and she will see for admission Final Clinical Impression(s) / ED Diagnoses Final diagnoses:  Generalized abdominal pain  Constipation, unspecified constipation type    Rx / DC Orders ED Discharge Orders    None       Pattricia Boss, MD 09/02/19 1458

## 2019-09-02 NOTE — ED Notes (Signed)
Dinner Tray Ordered @ 1712. 

## 2019-09-02 NOTE — H&P (Signed)
History and Physical    [name] [id number] DOB: 09/25/1940 DOA: 09/01/2019  PCP: Fayrene Helper, MD  Patient coming from: Home I have personally briefly reviewed patient's old medical records in [place]  Chief Complaint: Abdominal pain and distention since 1 and half weeks  HPI: [name] is a 79 y.o. male with medical history significant of coronary artery disease, normocytic anemia, obstructive sleep apnea on CPAP, COPD, seizure disorder, B12 deficiency, chronic hypoxemic respiratory failure on 2 L of oxygen via nasal cannulae at home, hypertension, hyperlipidemia, gout, GERD, CVA presents to emergency department due to worsening abdominal pain and distention.  Patient tells me that his abdominal pain is generalized, severe intensity, no aggravating or relieving factors, associated with abdominal distention, nausea, vomiting, decreased appetite.  Reports that he had not had bowel movement since 1 week.  Has been passing gas on and off.    He recently hospitalized for small bowel obstruction versus ileus at [facility].  General surgery was consulted recommended nonoperative management.  He discharged home on 08/30/2019.   He went to ER day before yesterday with similar symptoms. He had colonoscopy in 2013 which shows adenomatous colon polyps -had colonoscopy in 2019 which was unsuccessful due to poor preparation.  No history of headache, blurry vision, chest pain, shortness of breath, fever, chills, cough, congestion, dysuria, hematuria, back pain, change in urinary frequency.  He is on 2 L of  oxygen at home due to underlying COPD.  He is compliant with CPAP.  He lives with his wife at home.  Former smoker, no alcohol or illicit drug use history.  ED Course: Upon arrival to ED: Patient was tachycardic, tachypneic, elevated blood pressure, lipase: WNL, afebrile with leukocytosis of 10.9, H&H 11.7/38.2, UA negative for infection.  CT abdomen/pelvis was  obtained which shows no transition point to suggest ongoing small bowel obstruction.  Loops are less distended than on recent prior and ascites has resolved.  Lower lobe scarring and mild bronchiectasis.  Aortic atherosclerosis.  Coronary atherosclerosis.  General surgery and GI consulted by EDP.  Tried hospitalist consulted for admission for persistent abdominal pain and distention.  Review of Systems: As per HPI otherwise negative.    Past Medical History:  Diagnosis Date  . Abnormality of gait 05/01/2015  . Anemia 04/19/2015  . Annual physical exam 12/11/2013  . Arthritis    "left leg; knees" (05/07/2017)  . Arthritis of knee   . CAD (coronary artery disease)   . Choking 07/13/2015  . Chronic bronchitis ([place])    "get it q yr"  . Chronic lower back pain    "since I was a teen" (05/07/2017)  . COPD (chronic obstructive pulmonary disease) ([place])   . CVA (cerebral vascular accident) ([place]) 05/2015   R sided weakness (05/07/2017)  . GERD (gastroesophageal reflux disease)   . Gout   . History of colonic polyps 04/03/2017   Added automatically from request for surgery [id number]  . Hyperlipidemia   . Hypertension   . Myocardial infarction ([place]) 1999  . Obesity   . OSA on CPAP   . Pneumonia    "a few times; last time was ~ 06/2013" (05/07/2017)  . Psoriasis   . Tussive syncope 05/01/2015  . Type II diabetes mellitus ([place])   . Vitamin B12 deficiency 05/01/2015    Past Surgical History:  Procedure Laterality Date  . APPENDECTOMY    . CATARACT EXTRACTION W/PHACO Left 10/07/2016   Procedure: CATARACT EXTRACTION PHACO AND  INTRAOCULAR LENS PLACEMENT (IOC);  Surgeon: Rutherford Guys, MD;  Location: [facility];  Service: Ophthalmology;  Laterality: Left;  CDE: 13.49  . CATARACT EXTRACTION W/PHACO Right 10/28/2016   Procedure: CATARACT EXTRACTION PHACO AND INTRAOCULAR LENS PLACEMENT (IOC);  Surgeon: Rutherford Guys, MD;  Location: [facility];  Service: Ophthalmology;  Laterality: Right;  CDE: 16.71  .  COLONOSCOPY  03/17/2012   Procedure: COLONOSCOPY;  Surgeon: Rogene Houston, MD;  Location: [facility];  Service: Endoscopy;  Laterality: N/A;  830  . COLONOSCOPY N/A 06/25/2017   Procedure: COLONOSCOPY;  Surgeon: Rogene Houston, MD;  Location: [facility];  Service: Endoscopy;  Laterality: N/A;  [place]- 2009-08/30/2013   "counting today's, I have 5 stents" (08/30/2013)  . CORONARY ANGIOPLASTY WITH STENT PLACEMENT  05/07/2017  . CORONARY STENT INTERVENTION N/A 05/07/2017   Procedure: CORONARY STENT INTERVENTION;  Surgeon: Charolette Forward, MD;  Location: [place] [facility] CV LAB;  Service: Cardiovascular;  Laterality: N/A;  . CYSTOSCOPY WITH URETHRAL DILATATION N/A 11/26/2012   Procedure: CYSTOSCOPY WITH URETHRAL DILATATION;  Surgeon: Malka So, MD;  Location: [facility];  Service: Urology;  Laterality: N/A;  . LEFT HEART CATH AND CORONARY ANGIOGRAPHY N/A 05/07/2017   Procedure: LEFT HEART CATH AND CORONARY ANGIOGRAPHY;  Surgeon: Charolette Forward, MD;  Location: [place] [facility] LAB;  Service: Cardiovascular;  Laterality: N/A;  . LEFT HEART CATHETERIZATION WITH CORONARY ANGIOGRAM N/A 08/30/2013   Procedure: LEFT HEART CATHETERIZATION WITH CORONARY ANGIOGRAM;  Surgeon: Clent Demark, MD;  Location: [place] CATH LAB;  Service: Cardiovascular;  Laterality: N/A;  . PERCUTANEOUS CORONARY STENT INTERVENTION (PCI-S) Right 08/30/2013   Procedure: PERCUTANEOUS CORONARY STENT INTERVENTION (PCI-S);  Surgeon: Clent Demark, MD;  Location: [facility] CATH LAB;  Service: Cardiovascular;  Laterality: Right;     reports that he quit smoking about 16 years ago. His smoking use included cigarettes. He has a 16.50 pack-year smoking history. He has never used smokeless tobacco. He reports that he does not drink alcohol or use drugs.  No Known Allergies  Family History  Problem Relation Age of Onset  . Hypertension Mother   . Diabetes Mother   . Hypertension Father   . Diabetes  Brother   . Stroke Daughter   . Arthritis Other        Family History   . Diabetes Other        family History     Prior to Admission medications   Medication Sig Start Date End Date Taking? Authorizing Provider  acetaminophen (TYLENOL) 500 MG tablet Take 1,000 mg by mouth every 6 (six) hours as needed for moderate pain (left thigh).    Yes [provider]  albuterol (VENTOLIN HFA) 108 (90 Base) MCG/ACT inhaler Inhale 1-2 puffs into the lungs every 6 (six) hours as needed for wheezing or shortness of breath. 07/07/19  Yes Perlie Mayo, NP  allopurinol (ZYLOPRIM) 300 MG tablet Take 1 tablet (300 mg total) by mouth daily. 04/22/19 04/22/20 Yes Fayrene Helper, MD  aspirin EC 81 MG tablet Take 81 mg by mouth daily.   Yes [provider]  budesonide-formoterol (SYMBICORT) 80-4.5 MCG/ACT inhaler Inhale 2 puffs into the lungs daily. 07/07/19  Yes Perlie Mayo, NP  clopidogrel (PLAVIX) 75 MG tablet TAKE 1 TABLET (75 MG TOTAL) BY MOUTH DAILY. 04/25/19  Yes Fayrene Helper, MD  insulin NPH-regular Human (NOVOLIN 70/30) (70-30) 100 UNIT/ML injection Inject 35-45 Units into the skin See admin  instructions. INJECT 45 UNITS SUBCUTANEOUSLY WITH BREAKFAST AND 35 UNITS WITH SUPPER (WHEN GLUCOSE IS ABOVE 90) 08/29/19  Yes Nida, Marella Chimes, MD  isosorbide mononitrate (IMDUR) 30 MG 24 hr tablet Take 30 mg by mouth daily. 06/23/19  Yes [provider]  levETIRAcetam (KEPPRA) 500 MG tablet TAKE 1 TABLET TWICE DAILY Patient taking differently: Take 500 mg by mouth 2 (two) times daily.  02/08/19  Yes Fayrene Helper, MD  levothyroxine (SYNTHROID) 100 MCG tablet TAKE 1 TABLET (100 MCG TOTAL) BY MOUTH DAILY BEFORE BREAKFAST. 03/01/19  Yes Nida, Marella Chimes, MD  metFORMIN (GLUCOPHAGE) 500 MG tablet TAKE 1 TABLET EVERY DAY WITH BREAKFAST Patient taking differently: Take 500 mg by mouth daily with breakfast.  08/24/19  Yes Nida, Marella Chimes, MD  metoprolol tartrate  (LOPRESSOR) 50 MG tablet TAKE 1/2 TABLET TWICE DAILY Patient taking differently: Take 25 mg by mouth 2 (two) times daily.  08/29/19  Yes Fayrene Helper, MD  nitroGLYCERIN (NITROSTAT) 0.4 MG SL tablet Place 1 tablet (0.4 mg total) under the tongue every 5 (five) minutes as needed for chest pain. 05/08/17 09/02/19 Yes Charolette Forward, MD  pantoprazole (PROTONIX) 40 MG tablet TAKE 1 TABLET EVERY DAY BEFORE BREAKFAST Patient taking differently: Take 40 mg by mouth daily.  04/25/19  Yes Fayrene Helper, MD  pravastatin (PRAVACHOL) 40 MG tablet TAKE 2 TABLETS EVERY DAY Patient taking differently: Take 80 mg by mouth daily.  05/26/19  Yes Fayrene Helper, MD  senna (SENOKOT) 8.6 MG tablet Take 1-2 tablets by mouth daily as needed for constipation.    Yes [provider]  spironolactone (ALDACTONE) 25 MG tablet TAKE 1 TABLET EVERY DAY Patient taking differently: Take 25 mg by mouth daily.  02/08/19  Yes Fayrene Helper, MD  vitamin B-12 (CYANOCOBALAMIN) 1000 MCG tablet Take 1,000 mcg by mouth daily.   Yes [provider]    Physical Exam: Vitals:   09/02/19 1331 09/02/19 1415 09/02/19 1430 09/02/19 1515  BP: (!) 173/78     Pulse: (!) 110 96 99 (!) 106  Resp: (!) 24 (!) 22 (!) 21 (!) 24  Temp:      TempSrc:      SpO2: 92% 95% 92% 95%  Weight:      Height:        Constitutional: NAD, calm, comfortable, communicating well, obese, on 2 L of oxygen via nasal cannula. Eyes: PERRL, lids and conjunctivae normal ENMT: Mucous membranes are moist. Posterior pharynx clear of any exudate or lesions.Normal dentition.  Neck: normal, supple, no masses, no thyromegaly Respiratory: clear to auscultation bilaterally, no wheezing, no crackles. Normal respiratory effort. No accessory muscle use.  Cardiovascular: Regular rate and rhythm, no murmurs / rubs / gallops.  1+ bilateral pitting edema positive 2+ pedal pulses. No carotid bruits.  Abdomen: Distended, firm, tender on palpation.   No guarding, no rigidity.  Bowel sounds: Sluggish Musculoskeletal: no clubbing / cyanosis. No joint deformity upper and lower extremities. Good ROM, no contractures. Normal muscle tone.  Skin: no rashes, lesions, ulcers. No induration Neurologic: CN 2-12 grossly intact. Sensation intact, DTR normal. Strength 5/5 in all 4.  Psychiatric: Normal judgment and insight. Alert and oriented x 3. Normal mood.    Labs on Admission: I have personally reviewed following labs and imaging studies  CBC: Recent Labs  Lab 08/26/19 1637 08/26/19 1637 08/27/19 2138 08/28/19 0456 08/29/19 0439 08/31/19 2339 09/01/19 2308  WBC 11.7*   < > 15.3* 13.7* 10.9* 8.3 10.9*  NEUTROABS 7.5  --  12.3*  --   --  5.6  --   HGB 11.1*   < > 12.3* 11.6* 10.8* 10.2* 11.7*  HCT 36.6*   < > 39.7 38.8* 37.0* 33.8* 38.2*  MCV 92.0   < > 90.4 92.6 94.4 92.1 89.0  PLT 299   < > 315 288 276 248 280   < > = values in this interval not displayed.   Basic Metabolic Panel: Recent Labs  Lab 08/28/19 0456 08/29/19 0439 08/30/19 0417 08/31/19 2339 09/01/19 2308  NA 137 138 138 135 137  K 4.5 4.6 4.7 4.0 4.4  CL 101 103 104 103 101  CO2 26 27 26 25 25   GLUCOSE 131* 109* 101* 81 126*  BUN 16 13 12 17 11   CREATININE 1.03 1.11 1.08 1.39* 1.10  CALCIUM 8.9 8.5* 8.5* 8.4* 9.3  MG  --  1.6* 2.0  --   --   PHOS  --  3.9  --   --   --    GFR: Estimated Creatinine Clearance: 71.7 mL/min (by C-G formula based on SCr of 1.1 mg/dL). Liver Function Tests: Recent Labs  Lab 08/26/19 1637 08/27/19 2138 08/28/19 0456 09/01/19 2308  AST 14* 12* 11* 22  ALT 12 12 10 19   ALKPHOS 63 65 57 67  BILITOT 0.4 0.4 0.5 0.7  PROT 7.6 7.6 6.8 7.3  ALBUMIN 3.6 3.7 3.3* 3.6   Recent Labs  Lab 08/26/19 1637 08/27/19 2138 09/01/19 2308  LIPASE 25 166* 35   No results for input(s): AMMONIA in the last 168 hours. Coagulation Profile: No results for input(s): INR, PROTIME in the last 168 hours. Cardiac Enzymes: No results for  input(s): CKTOTAL, CKMB, CKMBINDEX, TROPONINI in the last 168 hours. BNP (last 3 results) No results for input(s): PROBNP in the last 8760 hours. HbA1C: No results for input(s): HGBA1C in the last 72 hours. CBG: Recent Labs  Lab 09/01/19 0140 09/01/19 0237 09/01/19 0435 09/01/19 0531 09/02/19 1322  GLUCAP 107* 109* 103* 109* 136*   Lipid Profile: No results for input(s): CHOL, HDL, LDLCALC, TRIG, CHOLHDL, LDLDIRECT in the last 72 hours. Thyroid Function Tests: No results for input(s): TSH, T4TOTAL, FREET4, T3FREE, THYROIDAB in the last 72 hours. Anemia Panel: No results for input(s): VITAMINB12, FOLATE, FERRITIN, TIBC, IRON, RETICCTPCT in the last 72 hours. Urine analysis:    Component Value Date/Time   COLORURINE YELLOW 09/02/2019 [place] 09/02/2019 0951   LABSPEC 1.018 09/02/2019 0951   PHURINE 5.0 09/02/2019 0951   GLUCOSEU NEGATIVE 09/02/2019 0951   HGBUR NEGATIVE 09/02/2019 0951   BILIRUBINUR NEGATIVE 09/02/2019 0951   BILIRUBINUR negative 11/13/2014 1640   KETONESUR NEGATIVE 09/02/2019 0951   PROTEINUR NEGATIVE 09/02/2019 0951   UROBILINOGEN 0.2 11/13/2014 1640   UROBILINOGEN 0.2 05/09/2013 2015   NITRITE NEGATIVE 09/02/2019 0951   LEUKOCYTESUR NEGATIVE 09/02/2019 0951    Radiological Exams on Admission:   Assessment/Plan Principal Problem:   Ileus ([place]) Active Problems:   Mixed hyperlipidemia   Morbid obesity ([place])   Essential hypertension   CAD (coronary artery disease)   Type II diabetes mellitus ([facility])   Normocytic anemia   GERD (gastroesophageal reflux disease)   Cerebral infarction ([facility])   Hypothyroidism   OSA on CPAP   Pain in the abdomen   Abdominal pain and distention: -Likely secondary to ileus.  Reviewed CT abdomen/pelvis. -He is afebrile with leukocytosis of 10.9.  Lipase: WNL. -Check lactic acid and COVID-19 -Admit patient  on the floor. -Continue IV fluids.  Zofran as needed for nausea and vomiting -Dilaudid as  needed for pain control -Start on clear liquid diet. -General surgery recommended okay for the patient to start to eat -GI-recommended to start on clear liquid, Dulcolax PR x1, Reglan 10 mg IV every 6 hour x3 doses, Moviprep. -Monitor vitals closely.  Hypertension: -Continue metoprolol, Aldactone  Diabetes mellitus:  -Last A1c 7.7 checked on 08/29/2019 -Hold Metformin, started patient on sliding scale insulin.  Monitor blood sugar closely  Seizure disorder: Continue Keppra  Hypothyroidism: Check TSH -Continue Synthyroid  Gout: Continue allopurinol  Coronary artery disease: Stable -Continue nitro as needed, aspirin, statin, Plavix, metoprolol, Imdur  Chronic hypoxemic respiratory failure due to underlying COPD: Patient uses 2 L of oxygen at baseline at home. -Continue on 2 L via nasal cannula. -No wheezing noted on exam. -Continue home inhalers.  GERD: Continue PPI  B12 deficiency: Continue B12 supplements  Hyperlipidemia: Continue statin  CVA: Continue aspirin, statin and Plavix  DVT prophylaxis: TED/SCD Code Status: Full code-confirmed with patient Family Communication: *Patient's wife present at bedside.  Plan of care discussed with patient and his wife at bedside in length and they verbalized understanding and agreed with it. Disposition Plan: Likely home in 1 to 2 days Consults called: General surgery and GI by EDP Admission status: Inpatient   Mckinley Jewel MD [facility]   If 7PM-7AM, please contact night-coverage [email or web address] Password [facility]  09/02/2019, 3:48 PM

## 2019-09-02 NOTE — Consult Note (Signed)
[facility] Consult: 12:34 PM 09/02/2019  LOS: 0 days    Referring Provider: Dr Jeanell Sparrow in ED  Primary Care Physician:  Fayrene Helper, MD Primary Gastroenterologist:  Dr. Laural Golden    Reason for Consultation:  Ileus vs bowel obstruction   HPI: [name] is a 79 y.o. male.  PMH IDDM.  CAD.  Seizure disorder.  CVA.  COPD on 2 Liter NCO2 during day.  OSA on CPAP at night.  CKD stage 2.  B12 deficiency.  Previous Plavix, stopped ~ 1 year ago.    02/2012 colonoscopy with removal of 10 mm polyp (serrated adenoma) from rectosigmoid, small rectal polyp "coagulated" using snare tip.  Nonbleeding hemorrhoids below dentate line. 06/2017 colonoscopy.  Poor prep with stool from rectum to transverse colon precluding visualization.  Plan was to repeat colonoscopy with better prep but this has yet to occur.   4/10-4/13/21 admission to [facility].  For abdominal pain constipation.  ED provider prescribed MiraLAX based on 08/26/2019 CTAP showing abundant stool.  However no BMs even after 8 capfuls of MiraLAX, and ensuing nonbloody emesis led to the admission.  WBCs 15.3, urine showed 11-20 WBCs culture grew greater than 100K of diphtheroids/Corynebacterium species.  From reviewing the progress notes it did not look as though the UTI was treated which wife confirms.  CTAP 4/10 showed multiple dilated loops SB but no clear transition point, small amount perihepatic fluid.  Treated with bowel rest, IV fluids.  General surgeon, Dr. Constance Haw, did not feel surgery was warranted.  08/29/2019 Gastrografin small bowel follow-through showed interval passage of contrast into the colon at 8 hours which excluded high-grade SBO, stable nonspecific gaseous distention of small bowel.  Pt had ~ 3 stools after enema.  He was advised to  follow-up with GI as he is 3 years overdue for colonoscopy.  Since discharge, no BM's despite Miralax, Senna.  Increased abd distention and discomfort in left abdomen into epigastrium.  Nausea and vomitted yellow bilious material last night.  Denies dysurin, oliguria, hematuria.  Came to [facility] ED.    09/01/2019 two-view portable abdomen.  Single, mildly dilated loop of small bowel in central abdomen.  Otherwise unremarkable BGP. CTAP w contrast today: Some distended loops of small bowel containing fluid but no transition point.  Oral contrast throughout the colon.  No bowel wall thickening.  Incidental finding of heavily calcified coronary arteries, bronchiectasis and lower lobe scarring on lower chest films.  WBCs 10.9.  Hb 11.7. BUN/creatinine normal.  Sodium, potassium normal. UA negative for nitrites, leukocytes.  No micro data on today's UA.   At baseline pt moves bowels 2 x weekly.  Does not drink alcohol.  No narcotics, no NSAIDs.  Lives at home with his wife.  Previous smoker.  Past Medical History:  Diagnosis Date  . Abnormality of gait 05/01/2015  . Anemia 04/19/2015  . Annual physical exam 12/11/2013  . Arthritis    "left leg; knees" (05/07/2017)  . Arthritis of knee   . CAD (coronary artery disease)   . Choking 07/13/2015  .  Chronic bronchitis ([place])    "get it q yr"  . Chronic lower back pain    "since I was a teen" (05/07/2017)  . COPD (chronic obstructive pulmonary disease) ([place])   . CVA (cerebral vascular accident) ([place]) 05/2015   R sided weakness (05/07/2017)  . GERD (gastroesophageal reflux disease)   . Gout   . History of colonic polyps 04/03/2017   Added automatically from request for surgery [id number]  . Hyperlipidemia   . Hypertension   . Myocardial infarction ([place]) 1999  . Obesity   . OSA on CPAP   . Pneumonia    "a few times; last time was ~ 06/2013" (05/07/2017)  . Psoriasis   . Tussive syncope 05/01/2015  . Type II diabetes mellitus ([place])   . Vitamin B12  deficiency 05/01/2015    Past Surgical History:  Procedure Laterality Date  . APPENDECTOMY    . CATARACT EXTRACTION W/PHACO Left 10/07/2016   Procedure: CATARACT EXTRACTION PHACO AND INTRAOCULAR LENS PLACEMENT (IOC);  Surgeon: Rutherford Guys, MD;  Location: [facility];  Service: Ophthalmology;  Laterality: Left;  CDE: 13.49  . CATARACT EXTRACTION W/PHACO Right 10/28/2016   Procedure: CATARACT EXTRACTION PHACO AND INTRAOCULAR LENS PLACEMENT (IOC);  Surgeon: Rutherford Guys, MD;  Location: [facility];  Service: Ophthalmology;  Laterality: Right;  CDE: 16.71  . COLONOSCOPY  03/17/2012   Procedure: COLONOSCOPY;  Surgeon: Rogene Houston, MD;  Location: [facility];  Service: Endoscopy;  Laterality: N/A;  830  . COLONOSCOPY N/A 06/25/2017   Procedure: COLONOSCOPY;  Surgeon: Rogene Houston, MD;  Location: [facility];  Service: Endoscopy;  Laterality: N/A;  [place]- 2009-08/30/2013   "counting today's, I have 5 stents" (08/30/2013)  . CORONARY ANGIOPLASTY WITH STENT PLACEMENT  05/07/2017  . CORONARY STENT INTERVENTION N/A 05/07/2017   Procedure: CORONARY STENT INTERVENTION;  Surgeon: Charolette Forward, MD;  Location: [place] [facility];  Service: Cardiovascular;  Laterality: N/A;  . CYSTOSCOPY WITH URETHRAL DILATATION N/A 11/26/2012   Procedure: CYSTOSCOPY WITH URETHRAL DILATATION;  Surgeon: Malka So, MD;  Location: [facility];  Service: Urology;  Laterality: N/A;  . LEFT HEART CATH AND CORONARY ANGIOGRAPHY N/A 05/07/2017   Procedure: LEFT HEART CATH AND CORONARY ANGIOGRAPHY;  Surgeon: Charolette Forward, MD;  Location: [place] [facility];  Service: Cardiovascular;  Laterality: N/A;  . LEFT HEART CATHETERIZATION WITH CORONARY ANGIOGRAM N/A 08/30/2013   Procedure: LEFT HEART CATHETERIZATION WITH CORONARY ANGIOGRAM;  Surgeon: Clent Demark, MD;  Location: [place] CATH LAB;  Service: Cardiovascular;  Laterality: N/A;  . PERCUTANEOUS CORONARY STENT INTERVENTION (PCI-S) Right  08/30/2013   Procedure: PERCUTANEOUS CORONARY STENT INTERVENTION (PCI-S);  Surgeon: Clent Demark, MD;  Location: [facility] CATH LAB;  Service: Cardiovascular;  Laterality: Right;    Prior to Admission medications   Medication Sig Start Date End Date Taking? Authorizing Provider  acetaminophen (TYLENOL) 500 MG tablet Take 1,000 mg by mouth every 6 (six) hours as needed for moderate pain (left thigh).    Yes [provider]  albuterol (VENTOLIN HFA) 108 (90 Base) MCG/ACT inhaler Inhale 1-2 puffs into the lungs every 6 (six) hours as needed for wheezing or shortness of breath. 07/07/19  Yes Perlie Mayo, NP  allopurinol (ZYLOPRIM) 300 MG tablet Take 1 tablet (300 mg total) by mouth daily. 04/22/19 04/22/20 Yes Fayrene Helper, MD  aspirin EC 81 MG tablet Take 81 mg by mouth daily.   Yes [provider]  budesonide-formoterol (  SYMBICORT) 80-4.5 MCG/ACT inhaler Inhale 2 puffs into the lungs daily. 07/07/19  Yes Perlie Mayo, NP  clopidogrel (PLAVIX) 75 MG tablet TAKE 1 TABLET (75 MG TOTAL) BY MOUTH DAILY. 04/25/19  Yes Fayrene Helper, MD  insulin NPH-regular Human (NOVOLIN 70/30) (70-30) 100 UNIT/ML injection Inject 35-45 Units into the skin See admin instructions. INJECT 45 UNITS SUBCUTANEOUSLY WITH BREAKFAST AND 35 UNITS WITH SUPPER (WHEN GLUCOSE IS ABOVE 90) 08/29/19  Yes Nida, Marella Chimes, MD  isosorbide mononitrate (IMDUR) 30 MG 24 hr tablet Take 30 mg by mouth daily. 06/23/19  Yes [provider]  levETIRAcetam (KEPPRA) 500 MG tablet TAKE 1 TABLET TWICE DAILY Patient taking differently: Take 500 mg by mouth 2 (two) times daily.  02/08/19  Yes Fayrene Helper, MD  levothyroxine (SYNTHROID) 100 MCG tablet TAKE 1 TABLET (100 MCG TOTAL) BY MOUTH DAILY BEFORE BREAKFAST. 03/01/19  Yes Nida, Marella Chimes, MD  metFORMIN (GLUCOPHAGE) 500 MG tablet TAKE 1 TABLET EVERY DAY WITH BREAKFAST Patient taking differently: Take 500 mg by mouth daily with breakfast.  08/24/19  Yes  Nida, Marella Chimes, MD  metoprolol tartrate (LOPRESSOR) 50 MG tablet TAKE 1/2 TABLET TWICE DAILY Patient taking differently: Take 25 mg by mouth 2 (two) times daily.  08/29/19  Yes Fayrene Helper, MD  nitroGLYCERIN (NITROSTAT) 0.4 MG SL tablet Place 1 tablet (0.4 mg total) under the tongue every 5 (five) minutes as needed for chest pain. 05/08/17 09/02/19 Yes Charolette Forward, MD  pantoprazole (PROTONIX) 40 MG tablet TAKE 1 TABLET EVERY DAY BEFORE BREAKFAST Patient taking differently: Take 40 mg by mouth daily.  04/25/19  Yes Fayrene Helper, MD  pravastatin (PRAVACHOL) 40 MG tablet TAKE 2 TABLETS EVERY DAY Patient taking differently: Take 80 mg by mouth daily.  05/26/19  Yes Fayrene Helper, MD  senna (SENOKOT) 8.6 MG tablet Take 1-2 tablets by mouth daily as needed for constipation.    Yes [provider]  spironolactone (ALDACTONE) 25 MG tablet TAKE 1 TABLET EVERY DAY Patient taking differently: Take 25 mg by mouth daily.  02/08/19  Yes Fayrene Helper, MD  vitamin B-12 (CYANOCOBALAMIN) 1000 MCG tablet Take 1,000 mcg by mouth daily.   Yes [provider]    Scheduled Meds: . sodium chloride flush  3 mL Intravenous Once   Infusions:  PRN Meds:    Allergies as of 09/01/2019  . (No Known Allergies)    Family History  Problem Relation Age of Onset  . Hypertension Mother   . Diabetes Mother   . Hypertension Father   . Diabetes Brother   . Stroke Daughter   . Arthritis Other        Family History   . Diabetes Other        family History     Social History   Socioeconomic History  . Marital status: Married    Spouse name: Not on file  . Number of children: 2  . Years of education: 87  . Highest education level: 12th grade  Occupational History  . Occupation: Brewing technologist     Comment: retired   Tobacco Use  . Smoking status: Former Smoker    Packs/day: 0.50    Years: 33.00    Pack years: 16.50    Types: Cigarettes    Quit date:  11/29/2002    Years since quitting: 16.7  . Smokeless tobacco: Never Used  Substance and Sexual Activity  . Alcohol use: No  . Drug use: No  .  Sexual activity: Not Currently  Other Topics Concern  . Not on file  Social History Narrative   Patient drinks about 3 cups of soda daily.    Patient is left handed.    Lives alone with wife    Social Determinants of Health   Financial Resource Strain:   . Difficulty of Paying Living Expenses:   Food Insecurity: Unknown  . Worried About Charity fundraiser in the Last Year: Not on file  . Ran Out of Food in the Last Year: Never true  Transportation Needs: No Transportation Needs  . Lack of Transportation (Medical): No  . Lack of Transportation (Non-Medical): No  Physical Activity: Inactive  . Days of Exercise per Week: 0 days  . Minutes of Exercise per Session: 0 min  Stress: No Stress Concern Present  . Feeling of Stress : Not at all  Social Connections: Somewhat Isolated  . Frequency of Communication with Friends and Family: Twice a week  . Frequency of Social Gatherings with Friends and Family: Once a week  . Attends Religious Services: Never  . Active Member of Clubs or Organizations: No  . Attends Archivist Meetings: Never  . Marital Status: Married  Human resources officer Violence:   . Fear of Current or Ex-Partner:   . Emotionally Abused:   Marland Kitchen Physically Abused:   . Sexually Abused:     REVIEW OF SYSTEMS: Constitutional: Patient physically inactive due to his chronic respiratory failure.  Activity limited to walking from room to room at home. ENT:  No nose bleeds Pulm: In the last few weeks he is required use of his daily oxygen chronically wears in previous months he could go without it at times. CV:  No palpitations, no angina.  Chronic lower extremity edema GU:  No hematuria, no frequency GI: See HPI. Heme: Denies excessive or unusual bleeding or bruising. Transfusions: None. Neuro:  No headaches, no peripheral  tingling or numbness.  No dizziness.  No seizures. Derm: Stasis dermatitis on his legs. Endocrine:  No sweats or chills.  No polyuria or dysuria Immunization: Not queried.  Vaccination history reviewed and he is up-to-date on multiple vaccinations.  Do not know about COVID-19 vaccination status. Travel:  None beyond local counties in last few months.    PHYSICAL EXAM: Vital signs in last 24 hours: Vitals:   09/02/19 1130 09/02/19 1200  BP:  (!) 150/89  Pulse: 96 99  Resp: (!) 21 19  Temp:    SpO2: 92% 97%   Wt Readings from Last 3 Encounters:  09/01/19 115.7 kg  08/31/19 113.4 kg  08/28/19 117.2 kg    General: Chronically unwell.  Obese, alert. Head: No facial asymmetry or swelling.  No signs of head trauma. Eyes: No scleral icterus.  No conjunctival pallor. Ears: Not hard of hearing. Nose: No congestion or discharge. Mouth: Moist, pink, clear oral mucosa.  Tongue midline. Neck: No JVD, no masses, no thyromegaly. Lungs: Clear bilaterally with overall reduced breath sounds.  Some dyspnea with minor effort.  No cough. Heart: RRR.  No MRG.  S1, S2 present. Abdomen: Distended, moderately tense.  Not tender.  No tinkling or tympanic bowel sounds and scant normal quality bowel sounds.  No organomegaly, bruits, hernias..   Rectal: Decreased rectal sphincter tone.  Small amount of soft tannish-brown stool on exam glove was not Hemoccult tested.  No masses. Musc/Skeltl: No joint redness or gross deformity. Extremities: Minimal pitting swelling in the lower legs Neurologic: Oriented x3.  Appropriate.  Moves all 4 limbs, no tremor, strength decreased overall.  Difficult for him to change positions from sitting at the side of the stretcher to laying on the stretcher. Skin: Stasis dermatitis in the lower legs. Tattoos: None observed. Nodes: No cervical adenopathy. Psych: Cooperative, calm, pleasant.  Intake/Output from previous day: No intake/output data recorded. Intake/Output this  shift: Total I/O In: -  Out: 200 [Urine:200]  LAB RESULTS: Recent Labs    08/31/19 2339 09/01/19 2308  WBC 8.3 10.9*  HGB 10.2* 11.7*  HCT 33.8* 38.2*  PLT 248 280   BMET Lab Results  Component Value Date   NA 137 09/01/2019   NA 135 08/31/2019   NA 138 08/30/2019   K 4.4 09/01/2019   K 4.0 08/31/2019   K 4.7 08/30/2019   CL 101 09/01/2019   CL 103 08/31/2019   CL 104 08/30/2019   CO2 25 09/01/2019   CO2 25 08/31/2019   CO2 26 08/30/2019   GLUCOSE 126 (H) 09/01/2019   GLUCOSE 81 08/31/2019   GLUCOSE 101 (H) 08/30/2019   BUN 11 09/01/2019   BUN 17 08/31/2019   BUN 12 08/30/2019   CREATININE 1.10 09/01/2019   CREATININE 1.39 (H) 08/31/2019   CREATININE 1.08 08/30/2019   CALCIUM 9.3 09/01/2019   CALCIUM 8.4 (L) 08/31/2019   CALCIUM 8.5 (L) 08/30/2019   LFT Recent Labs    09/01/19 2308  PROT 7.3  ALBUMIN 3.6  AST 22  ALT 19  ALKPHOS 67  BILITOT 0.7   PT/INR Lab Results  Component Value Date   INR 1.04 05/07/2017   INR 0.90 06/24/2015   INR 1.12 05/15/2015   Hepatitis Panel No results for input(s): HEPBSAG, HCVAB, HEPAIGM, HEPBIGM in the last 72 hours. C-Diff No components found for: CDIFF Lipase     Component Value Date/Time   LIPASE 35 09/01/2019 2308    Drugs of Abuse     Component Value Date/Time   LABOPIA NONE DETECTED 06/24/2015 0811   COCAINSCRNUR NONE DETECTED 06/24/2015 0811   LABBENZ NONE DETECTED 06/24/2015 0811   AMPHETMU NONE DETECTED 06/24/2015 0811   THCU NONE DETECTED 06/24/2015 0811   LABBARB NONE DETECTED 06/24/2015 0811     RADIOLOGY STUDIES: CT ABDOMEN PELVIS W CONTRAST  Result Date: 09/02/2019 CLINICAL DATA:  Generalized abdominal pain since last Wednesday, 9/10. Nausea and vomiting EXAM: CT ABDOMEN AND PELVIS WITH CONTRAST TECHNIQUE: Multidetector CT imaging of the abdomen and pelvis was performed using the standard protocol following bolus administration of intravenous contrast. CONTRAST:  159mL OMNIPAQUE IOHEXOL  300 MG/ML  SOLN COMPARISON:  CT six days ago FINDINGS: Lower chest: Heavily calcified coronary arteries. Mild cylindrical bronchiectasis and scarring in the lower lobes. Hepatobiliary: No focal liver abnormality.No evidence of biliary obstruction or stone. Pancreas: Unremarkable. Spleen: Unremarkable. Adrenals/Urinary Tract: Negative adrenals. No hydronephrosis or stone. 5.2 cm right renal cyst. Unremarkable bladder. Stomach/Bowel: There are some distended small bowel loops with fluid levels but no transition point. The colon contains oral contrast and gas throughout. No bowel wall thickening. Vascular/Lymphatic: No acute vascular abnormality. No mass or adenopathy. Reproductive:No pathologic findings. Other: No ascites or pneumoperitoneum. Musculoskeletal: No acute abnormalities. Advanced lumbar spine degeneration with spinal and foraminal stenoses. IMPRESSION: 1. No transition point to suggest ongoing small bowel obstruction. Loops are less distended than on recent prior and ascites has resolved. 2. Lower lobe scarring and mild bronchiectasis. 3.  Aortic Atherosclerosis (ICD10-I70.0).  Coronary atherosclerosis. Electronically Signed   By: Monte Fantasia M.D.   On: 09/02/2019  09:54   DG Abd Portable 2 Views  Result Date: 09/01/2019 CLINICAL DATA:  Vomiting with small bowel obstruction EXAM: PORTABLE ABDOMEN - 2 VIEW COMPARISON:  None. FINDINGS: Single mildly dilated loop of small bowel in the central abdomen. Bowel gas pattern otherwise unremarkable. Lung bases are clear. IMPRESSION: Single mildly dilated loop of small bowel in the central abdomen. Electronically Signed   By: Ulyses Jarred M.D.   On: 09/01/2019 00:35    IMPRESSION:   *   Constipation, intestinal inertia  *     Corynebacterium UTI based on cultures from 08/26/2019.  Did not receive antibiotic treatment according to the patient's wife and review of hospitalization notes.   *    OSA on CPAP., oxygen requiring COPD.  Chronic respiratory  failure.  *    IDDM.  *   serrated adenomatous colon polyp in 2013, unsuccessful colonoscopy 2019 so at this point he is 3 years overdue for surveillance colonoscopy.   PLAN:     *    Begin movie prep bowel prep.  No plans for colonoscopy just want him to get the "nuclear option "approach to his constipation. Reglan 10 mg IV q 6 hours x 3 doses.   Dulcolax PR x 1.    *   Treat the Corynbacterium UTI?? Or re-culture to see if still growing bacteria and treat based on that??   *   Clear diet  Azucena Freed  09/02/2019, 12:34 PM Phone [phone]

## 2019-09-02 NOTE — ED Notes (Signed)
Pt provided with drink for PO challenge 

## 2019-09-02 NOTE — Consult Note (Signed)
[name] 29-Jun-1940  CM:[id number].    Requesting MD: Dr. Pattricia Boss Chief Complaint/Reason for Consult: abdominal distention  HPI:  This is a 79 yo black male with multiple medical problems as outlined below.  He had a CVA in 2018 and developed worsening constipation at this time.  He takes Senna daily which he states helps.  He is followed by Dr. Laural Golden in [place] for his GI needs.  Apparently last Thursday the patient noted some left side abdominal discomfort, abdominal distention, and intermittent N/v.  He presented to [facility] where he was admitted for an SBO vs ileus.  He underwent the SBO protocol and contrast was noted in his colon on follow up films.  His diet was advanced and he was discharged on a soft diet.  He was having BMs at the time as well from the contrast he was given.  He has continued to pass flatus, but has not been eating much and has not moved his bowels in about 3 days since that time.  He returned to the ED the day after discharge on 4/14 due to continued abdominal distention.  He was discharged home.  He came to [facility] today for evaluation of continued abdominal distention with some N/V.  He did pass flatus today.  He underwent another CT scan that reveals no evidence of an SBO, but some distention of his colon c/w ileus.  We are consulted.  ROS: ROS: Please see HPI, otherwise he is positive for CPAP at night, chronic O2 dependence at home.  Mobilization with a walker secondary to side effects from his CVA.  Otherwise, all other systems have been reviewed and are negative.  Family History  Problem Relation Age of Onset  . Hypertension Mother   . Diabetes Mother   . Hypertension Father   . Diabetes Brother   . Stroke Daughter   . Arthritis Other        Family History   . Diabetes Other        family History     Past Medical History:  Diagnosis Date  . Abnormality of gait 05/01/2015  . Anemia 04/19/2015  . Annual physical exam 12/11/2013  . Arthritis      "left leg; knees" (05/07/2017)  . Arthritis of knee   . CAD (coronary artery disease)   . Choking 07/13/2015  . Chronic bronchitis ([place])    "get it q yr"  . Chronic lower back pain    "since I was a teen" (05/07/2017)  . COPD (chronic obstructive pulmonary disease) ([place])   . CVA (cerebral vascular accident) ([place]) 05/2015   R sided weakness (05/07/2017)  . GERD (gastroesophageal reflux disease)   . Gout   . History of colonic polyps 04/03/2017   Added automatically from request for surgery [id number]  . Hyperlipidemia   . Hypertension   . Myocardial infarction ([place]) 1999  . Obesity   . OSA on CPAP   . Pneumonia    "a few times; last time was ~ 06/2013" (05/07/2017)  . Psoriasis   . Tussive syncope 05/01/2015  . Type II diabetes mellitus ([place])   . Vitamin B12 deficiency 05/01/2015    Past Surgical History:  Procedure Laterality Date  . APPENDECTOMY    . CATARACT EXTRACTION W/PHACO Left 10/07/2016   Procedure: CATARACT EXTRACTION PHACO AND INTRAOCULAR LENS PLACEMENT (IOC);  Surgeon: Rutherford Guys, MD;  Location: [facility];  Service: Ophthalmology;  Laterality: Left;  CDE: 13.49  . CATARACT EXTRACTION W/PHACO  Right 10/28/2016   Procedure: CATARACT EXTRACTION PHACO AND INTRAOCULAR LENS PLACEMENT (IOC);  Surgeon: Rutherford Guys, MD;  Location: [facility];  Service: Ophthalmology;  Laterality: Right;  CDE: 16.71  . COLONOSCOPY  03/17/2012   Procedure: COLONOSCOPY;  Surgeon: Rogene Houston, MD;  Location: [facility];  Service: Endoscopy;  Laterality: N/A;  830  . COLONOSCOPY N/A 06/25/2017   Procedure: COLONOSCOPY;  Surgeon: Rogene Houston, MD;  Location: [facility];  Service: Endoscopy;  Laterality: N/A;  [place]- 2009-08/30/2013   "counting today's, I have 5 stents" (08/30/2013)  . CORONARY ANGIOPLASTY WITH STENT PLACEMENT  05/07/2017  . CORONARY STENT INTERVENTION N/A 05/07/2017   Procedure: CORONARY STENT INTERVENTION;  Surgeon: Charolette Forward, MD;  Location: [facility];  Service: Cardiovascular;  Laterality: N/A;  . CYSTOSCOPY WITH URETHRAL DILATATION N/A 11/26/2012   Procedure: CYSTOSCOPY WITH URETHRAL DILATATION;  Surgeon: Malka So, MD;  Location: [facility];  Service: Urology;  Laterality: N/A;  . LEFT HEART CATH AND CORONARY ANGIOGRAPHY N/A 05/07/2017   Procedure: LEFT HEART CATH AND CORONARY ANGIOGRAPHY;  Surgeon: Charolette Forward, MD;  Location: [place] [facility];  Service: Cardiovascular;  Laterality: N/A;  . LEFT HEART CATHETERIZATION WITH CORONARY ANGIOGRAM N/A 08/30/2013   Procedure: LEFT HEART CATHETERIZATION WITH CORONARY ANGIOGRAM;  Surgeon: Clent Demark, MD;  Location: [facility] CATH LAB;  Service: Cardiovascular;  Laterality: N/A;  . PERCUTANEOUS CORONARY STENT INTERVENTION (PCI-S) Right 08/30/2013   Procedure: PERCUTANEOUS CORONARY STENT INTERVENTION (PCI-S);  Surgeon: Clent Demark, MD;  Location: [facility] CATH LAB;  Service: Cardiovascular;  Laterality: Right;    Social History:  reports that he quit smoking about 16 years ago. His smoking use included cigarettes. He has a 16.50 pack-year smoking history. He has never used smokeless tobacco. He reports that he does not drink alcohol or use drugs.  Allergies: No Known Allergies  (Not in a hospital admission)  Physical Exam: Blood pressure (!) 173/83, pulse (!) 103, temperature 98.2 F (36.8 C), temperature source Oral, resp. rate (!) 23, height 5\' 10"  (1.778 m), weight 115.7 kg, SpO2 96 %. General: pleasant, chronically ill appearing black male who is laying in bed in NAD HEENT: head is normocephalic, atraumatic.  Sclera are noninjected.  PERRL.  Ears and nose without any masses or lesions, [place] in place on O2.  Mouth is pink and moist Heart: regular, rate, and rhythm.  Normal s1,s2. No obvious murmurs, gallops, or rubs noted.  Palpable radial and pedal pulses bilaterally Lungs: CTAB, no wheezes, rhonchi, or rales noted.  Respiratory effort nonlabored on O2. Abd:  distended with some tympany, minimally tender to palpation, +BS, no masses, hernias, or organomegaly. Patient does have a rectus diastasis. MS: all 4 extremities are symmetrical with no cyanosis, clubbing, or edema. Skin: warm and dry with no masses, lesions, or rashes Neuro: Cranial nerves 2-12 grossly intact, sensation is normal throughout Psych: A&Ox3 with an appropriate affect.   Results for orders placed or performed during the hospital encounter of 09/01/19 (from the past 48 hour(s))  Lipase, blood     Status: None   Collection Time: 09/01/19 11:08 PM  Result Value Ref Range   Lipase 35 11 - 51 U/L    Comment: Performed at [facility], [place] [address] [place], [place] [address]  Comprehensive metabolic panel     Status: Abnormal   Collection Time: 09/01/19 11:08 PM  Result Value Ref Range  Sodium 137 135 - 145 mmol/L   Potassium 4.4 3.5 - 5.1 mmol/L   Chloride 101 98 - 111 mmol/L   CO2 25 22 - 32 mmol/L   Glucose, Bld 126 (H) 70 - 99 mg/dL    Comment: Glucose reference range applies only to samples taken after fasting for at least 8 hours.   BUN 11 8 - 23 mg/dL   Creatinine, Ser 1.10 0.61 - 1.24 mg/dL   Calcium 9.3 8.9 - 10.3 mg/dL   Total Protein 7.3 6.5 - 8.1 g/dL   Albumin 3.6 3.5 - 5.0 g/dL   AST 22 15 - 41 U/L   ALT 19 0 - 44 U/L   Alkaline Phosphatase 67 38 - 126 U/L   Total Bilirubin 0.7 0.3 - 1.2 mg/dL   GFR calc non Af Amer >60 >60 mL/min   GFR calc Af Amer >60 >60 mL/min   Anion gap 11 5 - 15    Comment: Performed at [facility], [place] [address], [place], [place] [address]  CBC     Status: Abnormal   Collection Time: 09/01/19 11:08 PM  Result Value Ref Range   WBC 10.9 (H) 4.0 - 10.5 K/uL   RBC 4.29 4.22 - 5.81 MIL/uL   Hemoglobin 11.7 (L) 13.0 - 17.0 g/dL   HCT 38.2 (L) 39.0 - 52.0 %   MCV 89.0 80.0 - 100.0 fL   MCH 27.3 26.0 - 34.0 pg   MCHC 30.6 30.0 - 36.0 g/dL   RDW 15.1 11.5 - 15.5 %   Platelets 280 150 - 400 K/uL   nRBC 0.0 0.0 - 0.2  %    Comment: Performed at [facility], [place] [address] [place],  [address]  Urinalysis, Routine w reflex microscopic     Status: None   Collection Time: 09/02/19  9:51 AM  Result Value Ref Range   Color, Urine YELLOW YELLOW   APPearance CLEAR CLEAR   Specific Gravity, Urine 1.018 1.005 - 1.030   pH 5.0 5.0 - 8.0   Glucose, UA NEGATIVE NEGATIVE mg/dL   Hgb urine dipstick NEGATIVE NEGATIVE   Bilirubin Urine NEGATIVE NEGATIVE   Ketones, ur NEGATIVE NEGATIVE mg/dL   Protein, ur NEGATIVE NEGATIVE mg/dL   Nitrite NEGATIVE NEGATIVE   Leukocytes,Ua NEGATIVE NEGATIVE    Comment: Performed at [facility] [address], [place],  [address]   CT ABDOMEN PELVIS W CONTRAST  Result Date: 09/02/2019 CLINICAL DATA:  Generalized abdominal pain since last Wednesday, 9/10. Nausea and vomiting EXAM: CT ABDOMEN AND PELVIS WITH CONTRAST TECHNIQUE: Multidetector CT imaging of the abdomen and pelvis was performed using the standard protocol following bolus administration of intravenous contrast. CONTRAST:  153mL OMNIPAQUE IOHEXOL 300 MG/ML  SOLN COMPARISON:  CT six days ago FINDINGS: Lower chest: Heavily calcified coronary arteries. Mild cylindrical bronchiectasis and scarring in the lower lobes. Hepatobiliary: No focal liver abnormality.No evidence of biliary obstruction or stone. Pancreas: Unremarkable. Spleen: Unremarkable. Adrenals/Urinary Tract: Negative adrenals. No hydronephrosis or stone. 5.2 cm right renal cyst. Unremarkable bladder. Stomach/Bowel: There are some distended small bowel loops with fluid levels but no transition point. The colon contains oral contrast and gas throughout. No bowel wall thickening. Vascular/Lymphatic: No acute vascular abnormality. No mass or adenopathy. Reproductive:No pathologic findings. Other: No ascites or pneumoperitoneum. Musculoskeletal: No acute abnormalities. Advanced lumbar spine degeneration with spinal and foraminal stenoses. IMPRESSION: 1.  No transition point to suggest ongoing small bowel obstruction. Loops are less distended than  on recent prior and ascites has resolved. 2. Lower lobe scarring and mild bronchiectasis. 3.  Aortic Atherosclerosis (ICD10-I70.0).  Coronary atherosclerosis. Electronically Signed   By: Monte Fantasia M.D.   On: 09/02/2019 09:54   DG Abd Portable 2 Views  Result Date: 09/01/2019 CLINICAL DATA:  Vomiting with small bowel obstruction EXAM: PORTABLE ABDOMEN - 2 VIEW COMPARISON:  None. FINDINGS: Single mildly dilated loop of small bowel in the central abdomen. Bowel gas pattern otherwise unremarkable. Lung bases are clear. IMPRESSION: Single mildly dilated loop of small bowel in the central abdomen. Electronically Signed   By: Ulyses Jarred M.D.   On: 09/01/2019 00:35      Assessment/Plan MMP  Abdominal distention, suspect colonic ileus The patient has had extensive work up over the last week for his abdominal distention.  He has had multiple CT scans as well as plain films.  The patient was admitted earlier this week and underwent the SBO protocol which results in immediate passage of contrast into his colon.  He had several BMs after this as well.  His CT scan today also still shows contrast in his colon with no evidence of SBO.  It appears the patient still has colonic distention which is likely related to an ileus.  He is still passing flatus.  He had some nausea and vomited in the ED waiting area.  If the patient has further emesis, he may require an NGT, but if he does not this can likely be held.  Given he has no evidence of obstruction, there is no surgical intervention required.  Would recommend GI consult for management of bowel dysmotility/ileus.  He does state that since his CVA he has had an increase in constipation and motility issues.  He also is not very ambulatory secondary to side effects from his CVA and SOB with mobilization.  He will need to mobilize as much as he is able.  His electrolytes  look good for now.  Keep K >4.  We will sign off.  Defer to medicine and GI for further care of this patient.   Henreitta Cea, [facility] 09/02/2019, 11:09 AM Please see Amion for pager number during day hours 7:00am-4:30pm or 7:00am -11:30am on weekends

## 2019-09-03 DIAGNOSIS — K567 Ileus, unspecified: Secondary | ICD-10-CM | POA: Diagnosis not present

## 2019-09-03 LAB — COMPREHENSIVE METABOLIC PANEL
ALT: 23 U/L (ref 0–44)
AST: 21 U/L (ref 15–41)
Albumin: 2.8 g/dL — ABNORMAL LOW (ref 3.5–5.0)
Alkaline Phosphatase: 55 U/L (ref 38–126)
Anion gap: 8 (ref 5–15)
BUN: 11 mg/dL (ref 8–23)
CO2: 28 mmol/L (ref 22–32)
Calcium: 8.2 mg/dL — ABNORMAL LOW (ref 8.9–10.3)
Chloride: 103 mmol/L (ref 98–111)
Creatinine, Ser: 1.18 mg/dL (ref 0.61–1.24)
GFR calc Af Amer: >60 mL/min (ref >60)
GFR calc non Af Amer: 59 mL/min — ABNORMAL LOW (ref >60)
Glucose, Bld: 114 mg/dL — ABNORMAL HIGH (ref 70–99)
Potassium: 4.1 mmol/L (ref 3.5–5.1)
Sodium: 139 mmol/L (ref 135–145)
Total Bilirubin: 0.6 mg/dL (ref 0.3–1.2)
Total Protein: 6.1 g/dL — ABNORMAL LOW (ref 6.5–8.1)

## 2019-09-03 LAB — GLUCOSE, CAPILLARY
Glucose-Capillary: 95 mg/dL (ref 70–99)
Glucose-Capillary: 170 mg/dL — ABNORMAL HIGH (ref 70–99)
Glucose-Capillary: 131 mg/dL — ABNORMAL HIGH (ref 70–99)
Glucose-Capillary: 115 mg/dL — ABNORMAL HIGH (ref 70–99)

## 2019-09-03 LAB — CBC
HCT: 32 % — ABNORMAL LOW (ref 39.0–52.0)
Hemoglobin: 9.8 g/dL — ABNORMAL LOW (ref 13.0–17.0)
MCH: 27.7 pg (ref 26.0–34.0)
MCHC: 30.6 g/dL (ref 30.0–36.0)
MCV: 90.4 fL (ref 80.0–100.0)
Platelets: 257 10*3/uL (ref 150–400)
RBC: 3.54 MIL/uL — ABNORMAL LOW (ref 4.22–5.81)
RDW: 15.1 % (ref 11.5–15.5)
WBC: 10.1 10*3/uL (ref 4.0–10.5)
nRBC: 0 % (ref 0.0–0.2)

## 2019-09-03 LAB — T4, FREE: Free T4: 1.06 ng/dL (ref 0.61–1.12)

## 2019-09-03 MED ORDER — SENNOSIDES-DOCUSATE SODIUM 8.6-50 MG PO TABS
1.0000 | ORAL_TABLET | Freq: Every day | ORAL | Status: DC
  Administered 2019-09-03 – 2019-09-04 (×2): 1 via ORAL
  Filled 2019-09-03 (×2): qty 1

## 2019-09-03 NOTE — Progress Notes (Signed)
Progress Note for [facility]  Subjective: No complaints.  He feels well.  He only reported one bowel movement yesterday after using Miralax.  Objective: Vital signs in last 24 hours: Temp:  [97.5 F (36.4 C)-98.2 F (36.8 C)] 98.2 F (36.8 C) (04/17 0325) Pulse Rate:  [79-110] 79 (04/17 0325) Resp:  [19-24] 20 (04/16 1815) BP: (123-173)/(70-89) 123/74 (04/17 0325) SpO2:  [91 %-97 %] 95 % (04/17 0325) Weight:  [117.6 kg] 117.6 kg (04/17 0500) Last BM Date: 09/03/19  Intake/Output from previous day: 04/16 0701 - 04/17 0700 In: 871 [I.V.:871] Out: 551 [Urine:550; Stool:1] Intake/Output this shift: No intake/output data recorded.  General appearance: alert and no distress GI: soft, non-tender; bowel sounds normal; no masses,  no organomegaly  Lab Results: Recent Labs    08/31/19 2339 09/01/19 2308 09/03/19 0449  WBC 8.3 10.9* 10.1  HGB 10.2* 11.7* 9.8*  HCT 33.8* 38.2* 32.0*  PLT 248 280 257   BMET Recent Labs    08/31/19 2339 09/01/19 2308 09/03/19 0449  NA 135 137 139  K 4.0 4.4 4.1  CL 103 101 103  CO2 25 25 28   GLUCOSE 81 126* 114*  BUN 17 11 11   CREATININE 1.39* 1.10 1.18  CALCIUM 8.4* 9.3 8.2*   LFT Recent Labs    09/03/19 0449  PROT 6.1*  ALBUMIN 2.8*  AST 21  ALT 23  ALKPHOS 55  BILITOT 0.6   PT/INR No results for input(s): LABPROT, INR in the last 72 hours. Hepatitis Panel No results for input(s): HEPBSAG, HCVAB, HEPAIGM, HEPBIGM in the last 72 hours. C-Diff No results for input(s): CDIFFTOX in the last 72 hours. Fecal Lactopherrin No results for input(s): FECLLACTOFRN in the last 72 hours.  Studies/Results: CT ABDOMEN PELVIS W CONTRAST  Result Date: 09/02/2019 CLINICAL DATA:  Generalized abdominal pain since last Wednesday, 9/10. Nausea and vomiting EXAM: CT ABDOMEN AND PELVIS WITH CONTRAST TECHNIQUE: Multidetector CT imaging of the abdomen and pelvis was performed using the standard protocol following bolus administration of  intravenous contrast. CONTRAST:  141mL OMNIPAQUE IOHEXOL 300 MG/ML  SOLN COMPARISON:  CT six days ago FINDINGS: Lower chest: Heavily calcified coronary arteries. Mild cylindrical bronchiectasis and scarring in the lower lobes. Hepatobiliary: No focal liver abnormality.No evidence of biliary obstruction or stone. Pancreas: Unremarkable. Spleen: Unremarkable. Adrenals/Urinary Tract: Negative adrenals. No hydronephrosis or stone. 5.2 cm right renal cyst. Unremarkable bladder. Stomach/Bowel: There are some distended small bowel loops with fluid levels but no transition point. The colon contains oral contrast and gas throughout. No bowel wall thickening. Vascular/Lymphatic: No acute vascular abnormality. No mass or adenopathy. Reproductive:No pathologic findings. Other: No ascites or pneumoperitoneum. Musculoskeletal: No acute abnormalities. Advanced lumbar spine degeneration with spinal and foraminal stenoses. IMPRESSION: 1. No transition point to suggest ongoing small bowel obstruction. Loops are less distended than on recent prior and ascites has resolved. 2. Lower lobe scarring and mild bronchiectasis. 3.  Aortic Atherosclerosis (ICD10-I70.0).  Coronary atherosclerosis. Electronically Signed   By: Monte Fantasia M.D.   On: 09/02/2019 09:54    Medications:  Scheduled: . allopurinol  300 mg Oral Daily  . aspirin EC  81 mg Oral Daily  . clopidogrel  75 mg Oral Daily  . enoxaparin (LOVENOX) injection  40 mg Subcutaneous Q24H  . insulin aspart  0-15 Units Subcutaneous TID WC  . insulin aspart  0-5 Units Subcutaneous QHS  . isosorbide mononitrate  30 mg Oral Daily  . levETIRAcetam  500 mg Oral BID  . levothyroxine  100  mcg Oral QAC breakfast  . metoprolol tartrate  25 mg Oral BID  . mometasone-formoterol  2 puff Inhalation BID  . pantoprazole  40 mg Oral Daily  . pravastatin  80 mg Oral Daily  . sodium chloride flush  3 mL Intravenous Once  . spironolactone  25 mg Oral Daily  . vitamin B-12  1,000 mcg  Oral Daily   Continuous: . sodium chloride 100 mL/hr at 09/03/19 0538    Assessment/Plan: 1) Ileus. 2) Vomiting - resolved.   The patient exhibits good bowel sounds and he reports passing flatus.  He only complains about having hunger pains.  Plan: 1) Clear liquid diet and advance as tolerated. 2) If he is able to tolerate a regular diet, then he can be discharged home.  LOS: 1 day   [name] D 09/03/2019, 9:29 AM

## 2019-09-03 NOTE — Plan of Care (Signed)
  Problem: Nutrition: Goal: Adequate nutrition will be maintained Outcome: Progressing   

## 2019-09-03 NOTE — Progress Notes (Signed)
PROGRESS NOTE    [name]  [id number] DOB: 1940/12/01 DOA: 09/01/2019 PCP: Fayrene Helper, MD    Brief Narrative:  [name] is a 79 y.o. male with medical history significant of coronary artery disease, normocytic anemia, obstructive sleep apnea on CPAP, COPD, seizure disorder, B12 deficiency, chronic hypoxemic respiratory failure on 2 L of oxygen via nasal cannulae at home, hypertension, hyperlipidemia, gout, GERD, CVA presents to emergency department due to worsening abdominal pain and distention. Abdominal pain is generalized, severe intensity, no aggravating or relieving factors, associated with abdominal distention, nausea, vomiting, decreased appetite.  Reports that he had not had bowel movement since 1 week.  Has been passing gas on and off.    He recently hospitalized for small bowel obstruction versus ileus at [facility].  General surgery was consulted recommended nonoperative management.  He discharged home on 08/30/2019.   He went to ER day before yesterday with similar symptoms. He had colonoscopy in 2013 which shows adenomatous colon polyps. Colonoscopy in 2019 which was unsuccessful due to poor preparation.  No history of headache, blurry vision, chest pain, shortness of breath, fever, chills, cough, congestion, dysuria, hematuria, back pain, change in urinary frequency.  He is on 2 L of  oxygen at home due to underlying COPD.  He is compliant with CPAP.  He lives with his wife at home.  Former smoker, no alcohol or illicit drug use history.  Upon arrival to ED, patient was tachycardic, tachypneic, elevated blood pressure, lipase: WNL, afebrile with leukocytosis of 10.9, H&H 11.7/38.2, UA negative for infection.  CT abdomen/pelvis was obtained which shows no transition point to suggest ongoing small bowel obstruction.  Loops are less distended than on recent prior and ascites has resolved.  Lower lobe scarring and mild bronchiectasis.  Aortic  atherosclerosis.  Coronary atherosclerosis.  General surgery and GI consulted by EDP.  TRH consulted for admission for persistent abdominal pain and distention   Assessment & Plan:   Principal Problem:   Ileus ([place]) Active Problems:   Mixed hyperlipidemia   Morbid obesity (HCC)   Essential hypertension   CAD (coronary artery disease)   Type II diabetes mellitus (HCC)   Normocytic anemia   GERD (gastroesophageal reflux disease)   Cerebral infarction (HCC)   Hypothyroidism   OSA on CPAP   Pain in the abdomen   Abdominal pain associated with distention likely secondary to ileus versus chronic constipation Patient presents with recurrent abdominal pain, recently admitted for small bowel obstruction treated medically with resolution.  Patient states suffers with chronic constipation since his stroke.  KUB notable for single mildly dilated loop of small bowel central abdomen.  CT abdomen/pelvis with no transition point noted to suggest ongoing small bowel obstruction and loops of bowel seem less distended since previous study.  Seen by general surgery, no indication of bowel obstruction at this time and patient having bowel movement and no further surgical intervention required at this time.  Evaluated by gastroenterology and was given a bowel prep to assist with what appears to be chronic constipation as etiology of his symptoms.  Patient had bowel movement overnight, and currently tolerating clear liquid diet. --Diet advanced by GI to full liquids, will further advance given toleration --Start senna 1 tablet p.o. daily --Continue monitor bowel movements closely --Supportive care, mobilization  Essential hypertension BP 123/74, well controlled. --Metoprolol tartrate 25 mg p.o. twice daily, Imdur 30 mg p.o. daily, spironolactone 25 mg p.o. daily --Continue aspirin and statin  Type  2 diabetes mellitus Hemoglobin A1c 7.7 08/29/2019, not optimally controlled.  On Metformin 500 mg p.o.  daily, NPH 45 units every morning and 35 units qPM at home. --Insulin sliding scale while inpatient  History of seizure Disorder --Continue Keppra 500 mg p.o. twice daily  Hypothyroidism TSH elevated at 10.740, free T4 1.06, within normal limits. --Continue levothyroxine 100 mcg p.o. daily.  Gout: Continue allopurinol 300 mg p.o. daily  Chronic hypoxemic respiratory failure COPD Patient on supplemental oxygen, 2 L nasal cannula at baseline. --Continue supplemental oxygen, titrate for SPO2 greater than 88% --Continue Symbicort with Dulera as hospital substitution  GERD: Continue Protonix 40 mg p.o. daily  B12 deficiency: Continue cyanocobalamin 1000 mcg p.o. daily  Hyperlipidemia: Continue pravastatin 80 mg p.o. daily  History of CVA --Continue aspirin, statin, Plavix  Gait disturbance, recent frequent falls: Patient states has been off balance as of lately with several falls over the past month.  History of CVA. --PT evaluation   DVT prophylaxis: SCDs Code Status: Full code Family Communication: Spouse present at bedside  Disposition Plan:  Status is: Inpatient  Remains inpatient appropriate because:Ongoing active pain requiring inpatient pain management, Ongoing diagnostic testing needed not appropriate for outpatient work up and Inpatient level of care appropriate due to severity of illness   Dispo: The patient is from: Home              Anticipated d/c is to: Home              Anticipated d/c date is: 2 days              Patient currently is not medically stable to d/c.   Consultants:   [facility]  General Surgery - signed off 09/02/2019  Procedures:   none  Antimicrobials:   none   Subjective: Patient seen and examined bedside, resting comfortably.  Spouse present.  Received bowel prep yesterday due to abdominal pain/distention likely related to chronic constipation.  Reports bowel movement overnight.  Currently abdominal pain is central and has  improved.  Had been tolerating clear liquid diet, GI has now advanced to full liquids; will further advance depending on toleration tonight.  Patient states has had issues regarding chronic constipation since his stroke in the past.  No other complaints or concerns at this time.  Denies headache, no fever/chills/night sweats, no nausea/vomiting, no chest pain, no palpitations, no shortness of breath, no weakness, no fatigue.  No acute events overnight per nursing staff.  Objective: Vitals:   09/02/19 2025 09/03/19 0325 09/03/19 0500 09/03/19 1004  BP: 127/77 123/74  129/72  Pulse: 96 79    Resp:    19  Temp: (!) 97.5 F (36.4 C) 98.2 F (36.8 C)  98.3 F (36.8 C)  TempSrc: Oral Oral  Oral  SpO2: 91% 95%  97%  Weight:   117.6 kg   Height:        Intake/Output Summary (Last 24 hours) at 09/03/2019 1432 Last data filed at 09/03/2019 1400 Gross per 24 hour  Intake 1752.32 ml  Output 731 ml  Net 1021.32 ml   Filed Weights   09/01/19 2302 09/03/19 0500  Weight: 115.7 kg 117.6 kg    Examination:  General exam: Appears calm and comfortable, obese Respiratory system: Clear to auscultation. Respiratory effort normal.  Oxygenating well on 2 L nasal cannula which is his home dose Cardiovascular system: S1 & S2 heard, RRR. No JVD, murmurs, rubs, gallops or clicks. No pedal edema. Gastrointestinal system: Abdomen with  mild distention, soft, nontender to palpation, normoactive bowel sounds.  No organomegaly/masses appreciated.   Central nervous system: Alert and oriented. No focal neurological deficits. Extremities: Symmetric 5 x 5 power. Skin: No rashes, lesions or ulcers Psychiatry: Judgement and insight appear normal. Mood & affect appropriate.     Data Reviewed: I have personally reviewed following labs and imaging studies  CBC: Recent Labs  Lab 08/27/19 2138 08/27/19 2138 08/28/19 0456 08/29/19 0439 08/31/19 2339 09/01/19 2308 09/03/19 0449  WBC 15.3*   < > 13.7* 10.9* 8.3  10.9* 10.1  NEUTROABS 12.3*  --   --   --  5.6  --   --   HGB 12.3*   < > 11.6* 10.8* 10.2* 11.7* 9.8*  HCT 39.7   < > 38.8* 37.0* 33.8* 38.2* 32.0*  MCV 90.4   < > 92.6 94.4 92.1 89.0 90.4  PLT 315   < > 288 276 248 280 257   < > = values in this interval not displayed.   Basic Metabolic Panel: Recent Labs  Lab 08/29/19 0439 08/30/19 0417 08/31/19 2339 09/01/19 2308 09/02/19 1834 09/03/19 0449  NA 138 138 135 137  --  139  K 4.6 4.7 4.0 4.4  --  4.1  CL 103 104 103 101  --  103  CO2 27 26 25 25   --  28  GLUCOSE 109* 101* 81 126*  --  114*  BUN 13 12 17 11   --  11  CREATININE 1.11 1.08 1.39* 1.10  --  1.18  CALCIUM 8.5* 8.5* 8.4* 9.3  --  8.2*  MG 1.6* 2.0  --   --  1.6*  --   PHOS 3.9  --   --   --  3.4  --    GFR: Estimated Creatinine Clearance: 67.3 mL/min (by C-G formula based on SCr of 1.18 mg/dL). Liver Function Tests: Recent Labs  Lab 08/27/19 2138 08/28/19 0456 09/01/19 2308 09/03/19 0449  AST 12* 11* 22 21  ALT 12 10 19 23   ALKPHOS 65 57 67 55  BILITOT 0.4 0.5 0.7 0.6  PROT 7.6 6.8 7.3 6.1*  ALBUMIN 3.7 3.3* 3.6 2.8*   Recent Labs  Lab 08/27/19 2138 09/01/19 2308  LIPASE 166* 35   No results for input(s): AMMONIA in the last 168 hours. Coagulation Profile: No results for input(s): INR, PROTIME in the last 168 hours. Cardiac Enzymes: No results for input(s): CKTOTAL, CKMB, CKMBINDEX, TROPONINI in the last 168 hours. BNP (last 3 results) No results for input(s): PROBNP in the last 8760 hours. HbA1C: No results for input(s): HGBA1C in the last 72 hours. CBG: Recent Labs  Lab 09/02/19 1322 09/02/19 1837 09/02/19 2133 09/03/19 0657 09/03/19 1202  GLUCAP 136* 125* 100* 95 170*   Lipid Profile: No results for input(s): CHOL, HDL, LDLCALC, TRIG, CHOLHDL, LDLDIRECT in the last 72 hours. Thyroid Function Tests: Recent Labs    09/02/19 2027 09/03/19 0809  TSH 10.740*  --   FREET4  --  1.06   Anemia Panel: No results for input(s):  VITAMINB12, FOLATE, FERRITIN, TIBC, IRON, RETICCTPCT in the last 72 hours. Sepsis Labs: Recent Labs  Lab 09/02/19 1834  LATICACIDVEN 1.2    Recent Results (from the past 240 hour(s))  Urine culture     Status: Abnormal   Collection Time: 08/26/19  7:43 PM   Specimen: Urine, Clean Catch  Result Value Ref Range Status   Specimen Description   Final    URINE, CLEAN CATCH Performed  at [facility], [address] [place], [place] [address]    Special Requests   Final    NONE Performed at [facility], [address], [place], [place] [address]    Culture (A)  Final    >=100,000 COLONIES/mL DIPHTHEROIDS(CORYNEBACTERIUM SPECIES) Standardized susceptibility testing for this organism is not available. Performed at [facility], [place] [address] [place], [place] [address]    Report Status 08/28/2019 FINAL  Final  SARS CORONAVIRUS 2 (TAT 6-24 HRS) Nasopharyngeal Nasopharyngeal Swab     Status: None   Collection Time: 08/27/19  9:20 PM   Specimen: Nasopharyngeal Swab  Result Value Ref Range Status   SARS Coronavirus 2 NEGATIVE NEGATIVE Final    Comment: (NOTE) SARS-CoV-2 target nucleic acids are NOT DETECTED. The SARS-CoV-2 RNA is generally detectable in upper and lower respiratory specimens during the acute phase of infection. Negative results do not preclude SARS-CoV-2 infection, do not rule out co-infections with other pathogens, and should not be used as the sole basis for treatment or other patient management decisions. Negative results must be combined with clinical observations, patient history, and epidemiological information. The expected result is Negative. Fact Sheet for Patients: SugarRoll.be Fact Sheet for Healthcare Providers: https://www.woods-mathews.com/ This test is not yet approved or cleared by the [place] FDA and  has been authorized for detection and/or diagnosis of SARS-CoV-2 by FDA under an Emergency Use  Authorization (EUA). This EUA will remain  in effect (meaning this test can be used) for the duration of the COVID-19 declaration under Section 56 4(b)(1) of the Act, 21 U.S.C. section 360bbb-3(b)(1), unless the authorization is terminated or revoked sooner. Performed at [facility], [place] [address], [place], [place] [address]   SARS CORONAVIRUS 2 (TAT 6-24 HRS) Nasopharyngeal Nasopharyngeal Swab     Status: None   Collection Time: 09/02/19  4:00 PM   Specimen: Nasopharyngeal Swab  Result Value Ref Range Status   SARS Coronavirus 2 NEGATIVE NEGATIVE Final    Comment: (NOTE) SARS-CoV-2 target nucleic acids are NOT DETECTED. The SARS-CoV-2 RNA is generally detectable in upper and lower respiratory specimens during the acute phase of infection. Negative results do not preclude SARS-CoV-2 infection, do not rule out co-infections with other pathogens, and should not be used as the sole basis for treatment or other patient management decisions. Negative results must be combined with clinical observations, patient history, and epidemiological information. The expected result is Negative. Fact Sheet for Patients: SugarRoll.be Fact Sheet for Healthcare Providers: https://www.woods-mathews.com/ This test is not yet approved or cleared by the [place] FDA and  has been authorized for detection and/or diagnosis of SARS-CoV-2 by FDA under an Emergency Use Authorization (EUA). This EUA will remain  in effect (meaning this test can be used) for the duration of the COVID-19 declaration under Section 56 4(b)(1) of the Act, 21 U.S.C. section 360bbb-3(b)(1), unless the authorization is terminated or revoked sooner. Performed at [facility], [place] [address], [place], [place] [address]          Radiology Studies: CT ABDOMEN PELVIS W CONTRAST  Result Date: 09/02/2019 CLINICAL DATA:  Generalized abdominal pain since last Wednesday, 9/10.  Nausea and vomiting EXAM: CT ABDOMEN AND PELVIS WITH CONTRAST TECHNIQUE: Multidetector CT imaging of the abdomen and pelvis was performed using the standard protocol following bolus administration of intravenous contrast. CONTRAST:  160mL OMNIPAQUE IOHEXOL 300 MG/ML  SOLN COMPARISON:  CT six days ago FINDINGS: Lower chest: Heavily calcified coronary arteries. Mild cylindrical  bronchiectasis and scarring in the lower lobes. Hepatobiliary: No focal liver abnormality.No evidence of biliary obstruction or stone. Pancreas: Unremarkable. Spleen: Unremarkable. Adrenals/Urinary Tract: Negative adrenals. No hydronephrosis or stone. 5.2 cm right renal cyst. Unremarkable bladder. Stomach/Bowel: There are some distended small bowel loops with fluid levels but no transition point. The colon contains oral contrast and gas throughout. No bowel wall thickening. Vascular/Lymphatic: No acute vascular abnormality. No mass or adenopathy. Reproductive:No pathologic findings. Other: No ascites or pneumoperitoneum. Musculoskeletal: No acute abnormalities. Advanced lumbar spine degeneration with spinal and foraminal stenoses. IMPRESSION: 1. No transition point to suggest ongoing small bowel obstruction. Loops are less distended than on recent prior and ascites has resolved. 2. Lower lobe scarring and mild bronchiectasis. 3.  Aortic Atherosclerosis (ICD10-I70.0).  Coronary atherosclerosis. Electronically Signed   By: Monte Fantasia M.D.   On: 09/02/2019 09:54        Scheduled Meds: . allopurinol  300 mg Oral Daily  . aspirin EC  81 mg Oral Daily  . clopidogrel  75 mg Oral Daily  . enoxaparin (LOVENOX) injection  40 mg Subcutaneous Q24H  . insulin aspart  0-15 Units Subcutaneous TID WC  . insulin aspart  0-5 Units Subcutaneous QHS  . isosorbide mononitrate  30 mg Oral Daily  . levETIRAcetam  500 mg Oral BID  . levothyroxine  100 mcg Oral QAC breakfast  . metoprolol tartrate  25 mg Oral BID  . mometasone-formoterol  2 puff  Inhalation BID  . pantoprazole  40 mg Oral Daily  . pravastatin  80 mg Oral Daily  . sodium chloride flush  3 mL Intravenous Once  . spironolactone  25 mg Oral Daily  . vitamin B-12  1,000 mcg Oral Daily   Continuous Infusions: . sodium chloride 100 mL/hr at 09/03/19 0538     LOS: 1 day    Time spent: 35 minutes spent on chart review, discussion with nursing staff, consultants, updating family and interview/physical exam; more than 50% of that time was spent in counseling and/or coordination of care.    Ashe Gago J [place] DO [facility] Available via Epic secure chat 7am-7pm After these hours, please refer to coverage provider listed on [email or web address] 09/03/2019, 2:32 PM

## 2019-09-03 NOTE — Progress Notes (Signed)
Large loose bm- light brown

## 2019-09-03 NOTE — Progress Notes (Signed)
Unable to void- bladder scan showed 445ml  in bladder- attempted in/out cath unable to advance into bladder- but was enough to stimulate him to urinate on his own 323ml

## 2019-09-04 DIAGNOSIS — K567 Ileus, unspecified: Secondary | ICD-10-CM | POA: Diagnosis not present

## 2019-09-04 LAB — BASIC METABOLIC PANEL
Anion gap: 9 (ref 5–15)
BUN: 11 mg/dL (ref 8–23)
CO2: 26 mmol/L (ref 22–32)
Calcium: 8 mg/dL — ABNORMAL LOW (ref 8.9–10.3)
Chloride: 101 mmol/L (ref 98–111)
Creatinine, Ser: 1.25 mg/dL — ABNORMAL HIGH (ref 0.61–1.24)
GFR calc Af Amer: >60 mL/min (ref >60)
GFR calc non Af Amer: 55 mL/min — ABNORMAL LOW (ref >60)
Glucose, Bld: 138 mg/dL — ABNORMAL HIGH (ref 70–99)
Potassium: 4.2 mmol/L (ref 3.5–5.1)
Sodium: 136 mmol/L (ref 135–145)

## 2019-09-04 LAB — GLUCOSE, CAPILLARY: Glucose-Capillary: 178 mg/dL — ABNORMAL HIGH (ref 70–99)

## 2019-09-04 MED ORDER — POLYETHYLENE GLYCOL 3350 17 G PO PACK: 17.0000 g | PACK | Freq: Every day | ORAL | 0 refills | Status: AC | PRN

## 2019-09-04 MED ORDER — SENNOSIDES-DOCUSATE SODIUM 8.6-50 MG PO TABS: 1.0000 | ORAL_TABLET | Freq: Two times a day (BID) | ORAL | 0 refills | Status: AC

## 2019-09-04 NOTE — Progress Notes (Signed)
Progress Note for [facility]  Subjective: No complaints.  Tolerated regular diet.  Passing flatus.  Objective: Vital signs in last 24 hours: Temp:  [98.3 F (36.8 C)-98.6 F (37 C)] 98.3 F (36.8 C) (04/18 0344) Pulse Rate:  [65-78] 65 (04/18 0344) Resp:  [16-19] 16 (04/18 0344) BP: (129-141)/(63-72) 129/63 (04/18 0344) SpO2:  [94 %-97 %] 97 % (04/18 0344) Weight:  [117.6 kg] 117.6 kg (04/18 0500) Last BM Date: 09/03/19  Intake/Output from previous day: 04/17 0701 - 04/18 0700 In: 1276.8 [P.O.:360; I.V.:916.8] Out: 780 [Urine:780] Intake/Output this shift: No intake/output data recorded.  General appearance: alert and no distress GI: soft, non-tender; bowel sounds normal; no masses,  no organomegaly and tympanic  Lab Results: Recent Labs    09/01/19 2308 09/03/19 0449  WBC 10.9* 10.1  HGB 11.7* 9.8*  HCT 38.2* 32.0*  PLT 280 257   BMET Recent Labs    09/01/19 2308 09/03/19 0449 09/04/19 0255  NA 137 139 136  K 4.4 4.1 4.2  CL 101 103 101  CO2 25 28 26   GLUCOSE 126* 114* 138*  BUN 11 11 11   CREATININE 1.10 1.18 1.25*  CALCIUM 9.3 8.2* 8.0*   LFT Recent Labs    09/03/19 0449  PROT 6.1*  ALBUMIN 2.8*  AST 21  ALT 23  ALKPHOS 55  BILITOT 0.6   PT/INR No results for input(s): LABPROT, INR in the last 72 hours. Hepatitis Panel No results for input(s): HEPBSAG, HCVAB, HEPAIGM, HEPBIGM in the last 72 hours. C-Diff No results for input(s): CDIFFTOX in the last 72 hours. Fecal Lactopherrin No results for input(s): FECLLACTOFRN in the last 72 hours.  Studies/Results: CT ABDOMEN PELVIS W CONTRAST  Result Date: 09/02/2019 CLINICAL DATA:  Generalized abdominal pain since last Wednesday, 9/10. Nausea and vomiting EXAM: CT ABDOMEN AND PELVIS WITH CONTRAST TECHNIQUE: Multidetector CT imaging of the abdomen and pelvis was performed using the standard protocol following bolus administration of intravenous contrast. CONTRAST:  11mL OMNIPAQUE IOHEXOL 300 MG/ML   SOLN COMPARISON:  CT six days ago FINDINGS: Lower chest: Heavily calcified coronary arteries. Mild cylindrical bronchiectasis and scarring in the lower lobes. Hepatobiliary: No focal liver abnormality.No evidence of biliary obstruction or stone. Pancreas: Unremarkable. Spleen: Unremarkable. Adrenals/Urinary Tract: Negative adrenals. No hydronephrosis or stone. 5.2 cm right renal cyst. Unremarkable bladder. Stomach/Bowel: There are some distended small bowel loops with fluid levels but no transition point. The colon contains oral contrast and gas throughout. No bowel wall thickening. Vascular/Lymphatic: No acute vascular abnormality. No mass or adenopathy. Reproductive:No pathologic findings. Other: No ascites or pneumoperitoneum. Musculoskeletal: No acute abnormalities. Advanced lumbar spine degeneration with spinal and foraminal stenoses. IMPRESSION: 1. No transition point to suggest ongoing small bowel obstruction. Loops are less distended than on recent prior and ascites has resolved. 2. Lower lobe scarring and mild bronchiectasis. 3.  Aortic Atherosclerosis (ICD10-I70.0).  Coronary atherosclerosis. Electronically Signed   By: Monte Fantasia M.D.   On: 09/02/2019 09:54    Medications:  Scheduled: . allopurinol  300 mg Oral Daily  . aspirin EC  81 mg Oral Daily  . clopidogrel  75 mg Oral Daily  . enoxaparin (LOVENOX) injection  40 mg Subcutaneous Q24H  . insulin aspart  0-15 Units Subcutaneous TID WC  . insulin aspart  0-5 Units Subcutaneous QHS  . isosorbide mononitrate  30 mg Oral Daily  . levETIRAcetam  500 mg Oral BID  . levothyroxine  100 mcg Oral QAC breakfast  . metoprolol tartrate  25 mg Oral  BID  . mometasone-formoterol  2 puff Inhalation BID  . pantoprazole  40 mg Oral Daily  . pravastatin  80 mg Oral Daily  . senna-docusate  1 tablet Oral Daily  . sodium chloride flush  3 mL Intravenous Once  . spironolactone  25 mg Oral Daily  . vitamin B-12  1,000 mcg Oral Daily    Continuous:   Assessment/Plan: 1) Ileus. 2) History of constipation.   The patient is well clinically.  His abdomen does exhibit normoactive bowel sounds, but it is still tympanic.  There is no pain with palpation and he was able to tolerated a regular diet yesterday.  Plan: 1) Okay to D/C home. 2) Follow up with primary GI. 3) Signing off.  LOS: 2 days   Makia Bossi D 09/04/2019, 8:13 AM

## 2019-09-04 NOTE — Plan of Care (Signed)
  Problem: Education: Goal: Knowledge of General Education information will improve Description: Including pain rating scale, medication(s)/side effects and non-pharmacologic comfort measures 09/04/2019 1030 by Lurline Idol, RN Outcome: Adequate for Discharge 09/04/2019 1030 by Lurline Idol, RN Outcome: Progressing   Problem: Health Behavior/Discharge Planning: Goal: Ability to manage health-related needs will improve 09/04/2019 1030 by Lurline Idol, RN Outcome: Adequate for Discharge 09/04/2019 1030 by Lurline Idol, RN Outcome: Progressing   Problem: Activity: Goal: Risk for activity intolerance will decrease 09/04/2019 1030 by Lurline Idol, RN Outcome: Adequate for Discharge 09/04/2019 1030 by Lurline Idol, RN Outcome: Progressing   Problem: Nutrition: Goal: Adequate nutrition will be maintained 09/04/2019 1030 by Lurline Idol, RN Outcome: Adequate for Discharge 09/04/2019 1030 by Lurline Idol, RN Outcome: Progressing   Problem: Elimination: Goal: Will not experience complications related to bowel motility 09/04/2019 1030 by Lurline Idol, RN Outcome: Adequate for Discharge 09/04/2019 1030 by Lurline Idol, RN Outcome: Progressing Goal: Will not experience complications related to urinary retention 09/04/2019 1030 by Lurline Idol, RN Outcome: Adequate for Discharge 09/04/2019 1030 by Lurline Idol, RN Outcome: Progressing   Problem: Pain Managment: Goal: General experience of comfort will improve 09/04/2019 1030 by Lurline Idol, RN Outcome: Adequate for Discharge 09/04/2019 1030 by Lurline Idol, RN Outcome: Progressing   Problem: Safety: Goal: Ability to remain free from injury will improve 09/04/2019 1030 by Lurline Idol, RN Outcome: Adequate for Discharge 09/04/2019 1030 by Lurline Idol, RN Outcome: Progressing

## 2019-09-04 NOTE — TOC Transition Note (Signed)
Transition of Care [facility]) - CM/SW Discharge Note Marvetta Gibbons RN, BSN Transitions of Care Unit 4E- RN Case Manager [phone] Weekend Coverage    Patient Details  Name: [name] MRN: [id number] Date of Birth: February 18, 1941  Transition of Care [facility]) CM/SW Contact:  Dawayne Patricia, RN Phone Number: 09/04/2019, 2:10 PM   Clinical Narrative:    Pt stable for transition home, notified by PT this am that they were recommending [facility] services- MD has placed order for HHPT. Call made to room to discuss [name] needs- however pt states he is on his way out the door and wife is "downstairs waiting for him" request that CM calls wife at home once they get there. Time allowed for pt to settle at home.  Post discharge call made to home to speak with wife as per pt request regarding HHPT- spoke with wife [name], confirmed address, phone # and PCP. Choice offered Per CMS guidelines from medicare.gov website with star ratings - per wife she does not have a preference as long as company in network with insurance- asked her if [facility] would be ok as know they are in network- wife agreeable.  Call made to [facility]- with [facility] for HHPT referral- referral has been accepted with start of care either 4/20 or 4/21.   Final next level of care: [place] Barriers to Discharge: Barriers Resolved   Patient Goals and CMS Choice     Choice offered to / list presented to : Spouse  Discharge Placement                 Home with [facility]      Discharge Plan and Services   Discharge Planning Services: CM Consult Post Acute Care Choice: Home Health                    HH Arranged: PT [place]: [facility] (formerly [employer]) Date [place]: 09/04/19 Time [place]: [place] Representative spoke with at [place]: [place] ([place]) Interventions     Readmission Risk Interventions No flowsheet data found.

## 2019-09-04 NOTE — Discharge Instructions (Signed)

## 2019-09-04 NOTE — Progress Notes (Signed)
Patient discharged but waiting for social worker to set up home health.

## 2019-09-04 NOTE — Evaluation (Signed)
Physical Therapy Evaluation and Discharge Patient Details Name: [name] MRN: [id number] DOB: 17-Feb-1941 Today's Date: 09/04/2019   History of Present Illness  [name] is a 79 y.o. male with medical history significant of coronary artery disease, normocytic anemia, obstructive sleep apnea on CPAP, COPD, seizure disorder, B12 deficiency, chronic hypoxemic respiratory failure on 2 L of oxygen via nasal cannulae at home, hypertension, hyperlipidemia, gout, GERD, CVA presents to emergency department due to worsening abdominal pain and distention.  Clinical Impression   Patient evaluated by Physical Therapy with no further acute PT needs identified, as plan is to go home, and DC summary is in chart;  All education has been completed and the patient has no further questions. Communicated with Dr. [place] the recommendation for HHPT follow up;  See below for any follow-up Physical Therapy or equipment needs. PT is signing off. Thank you for this referral.     Follow Up Recommendations Home health PT    Equipment Recommendations  None recommended by PT    Recommendations for Other Services       Precautions / Restrictions Precautions Precautions: Fall Restrictions Weight Bearing Restrictions: No      Mobility  Bed Mobility                  Transfers Overall transfer level: Needs assistance Equipment used: Rolling walker (2 wheeled) Transfers: Sit to/from Stand Sit to Stand: Min guard(without physical contact)         General transfer comment: increased time, labored movement; dependent on UEs to push up on armrests  Ambulation/Gait Ambulation/Gait assistance: Min guard;Supervision Gait Distance (Feet): 250 Feet Assistive device: Rolling walker (2 wheeled) Gait Pattern/deviations: Decreased stride length Gait velocity: decreased   General Gait Details: Cues to self-monitor for activity tolerance; Walked on 2 L supplemental O2, spot check during walk  showed 94% O2 sat; Noting tends to keep hips flexed with amb; DOE 2/4 end of walk  Stairs            Wheelchair Mobility    Modified Rankin (Stroke Patients Only)       Balance     Sitting balance-Leahy Scale: Good       Standing balance-Leahy Scale: Fair                               Pertinent Vitals/Pain Pain Assessment: No/denies pain    Home Living Family/patient expects to be discharged to:: Private residence Living Arrangements: Spouse/significant other Available Help at Discharge: Family;Available PRN/intermittently Type of Home: House Home Access: Stairs to enter Entrance Stairs-Rails: None Entrance Stairs-Number of Steps: 1 Home Layout: One level Home Equipment: Walker - 4 wheels;Walker - 2 wheels;Cane - single point;Shower seat;Wheelchair - manual;Bedside commode      Prior Function Level of Independence: Independent with assistive device(s)         Comments: Household and short distanced community ambulator with Film/video editor Dominance   Dominant Hand: Left    Extremity/Trunk Assessment   Upper Extremity Assessment Upper Extremity Assessment: Overall WFL for tasks assessed(fro simple tasks)    Lower Extremity Assessment Lower Extremity Assessment: Overall WFL for tasks assessed;Generalized weakness(some difficulty rising to stand)       Communication   Communication: No difficulties  Cognition Arousal/Alertness: Awake/alert Behavior During Therapy: WFL for tasks assessed/performed Overall Cognitive Status: Within Functional Limits for tasks assessed  General Comments      Exercises     Assessment/Plan    PT Assessment All further PT needs can be met in the next venue of care  PT Problem List Decreased strength;Decreased activity tolerance;Decreased balance;Decreased mobility       PT Treatment Interventions Balance training;Gait training;Stair  training;Functional mobility training;Therapeutic activities;Therapeutic exercise;Patient/family education    PT Goals (Current goals can be found in the Care Plan section)  Acute Rehab PT Goals Patient Stated Goal: Home today PT Goal Formulation: All assessment and education complete, DC therapy    Frequency     Barriers to discharge        Co-evaluation               AM-PAC PT "6 Clicks" Mobility  Outcome Measure Help needed turning from your back to your side while in a flat bed without using bedrails?: None Help needed moving from lying on your back to sitting on the side of a flat bed without using bedrails?: None Help needed moving to and from a bed to a chair (including a wheelchair)?: None Help needed standing up from a chair using your arms (e.g., wheelchair or bedside chair)?: A Little Help needed to walk in hospital room?: None Help needed climbing 3-5 steps with a railing? : A Lot 6 Click Score: 21    End of Session Equipment Utilized During Treatment: Oxygen Activity Tolerance: Patient tolerated treatment well Patient left: in chair;with call bell/phone within reach Nurse Communication: Mobility status PT Visit Diagnosis: Unsteadiness on feet (R26.81);Other abnormalities of gait and mobility (R26.89);Muscle weakness (generalized) (M62.81)    Time: 0940-1003 PT Time Calculation (min) (ACUTE ONLY): 23 min   Charges:   PT Evaluation $PT Eval Low Complexity: 1 Low PT Treatments $Gait Training: 8-22 mins        Roney Marion, PT  Acute Rehabilitation Services Pager [phone] Office [phone]   [place] 09/04/2019, 10:34 AM

## 2019-09-04 NOTE — Discharge Summary (Signed)
Physician Discharge Summary  [name] [id number] DOB: Feb 04, 1941 DOA: 09/01/2019  PCP: Fayrene Helper, MD  Admit date: 09/01/2019 Discharge date: 09/04/2019  Admitted From: Home Disposition: Home  Recommendations for Outpatient Follow-up:  1. Follow up with PCP in 1-2 weeks 2. Continue Senokot 1 tablet twice daily with MiraLAX as needed to ensure bowel movements daily versus every other day 3. Outpatient follow-up with gastroenterology  Home Health: No Equipment/Devices: None  Discharge Condition: Stable CODE STATUS: Full code Diet recommendation: Consistent carbohydrate, heart healthy diet  History of present illness:  [name] is a 79 y.o.malewith medical history significant ofcoronary artery disease, normocytic anemia, obstructive sleep apnea on CPAP, COPD, seizure disorder, B12 deficiency, chronic hypoxemic respiratory failure on 2 L of oxygen via nasal cannulae at home, hypertension, hyperlipidemia, gout, GERD, CVA presents to emergency department due to worsening abdominal pain and distention. Abdominal pain is generalized, severe intensity, no aggravating or relieving factors, associated with abdominal distention, nausea, vomiting, decreased appetite. Reports that he had not had bowel movement since 1 week. Has been passing gas on and off.   He recently hospitalized for small bowel obstruction versus ileus[facility]. General surgery was consulted recommended nonoperative management. He discharged home on 08/30/2019.   He went to ER day before yesterday with similar symptoms. He had colonoscopy in 2013 which shows adenomatous colon polyps. Colonoscopy in 2019 which was unsuccessful due to poor preparation.  No history of headache, blurry vision, chest pain, shortness of breath, fever, chills, cough, congestion, dysuria, hematuria, back pain, change in urinary frequency. He is on 2 L of oxygen at home due to underlying COPD. He is  compliant with CPAP.  He lives with his wife at home. Former smoker, no alcohol or illicit drug use history.  Upon arrival to ED, patient was tachycardic, tachypneic, elevated blood pressure, lipase: WNL, afebrile with leukocytosis of 10.9, H&H 11.7/38.2, UA negative for infection. CT abdomen/pelvis was obtained which shows no transition point to suggest ongoing small bowel obstruction. Loops are less distended than on recent prior and ascites has resolved. Lower lobe scarring and mild bronchiectasis. Aortic atherosclerosis. Coronary atherosclerosis. General surgery and GI consulted by EDP.  TRH consulted for admission for persistent abdominal pain and distention  Hospital course:  Abdominal pain associated with distention likely secondary to ileus versus chronic constipation Patient presents with recurrent abdominal pain, recently admitted for small bowel obstruction treated medically with resolution.  Patient states suffers with chronic constipation since his stroke.  KUB notable for single mildly dilated loop of small bowel central abdomen.  CT abdomen/pelvis with no transition point noted to suggest ongoing small bowel obstruction and loops of bowel seem less distended since previous study.  Seen by general surgery, no indication of bowel obstruction at this time and patient having bowel movement and no further surgical intervention required at this time.  Evaluated by gastroenterology and was given a bowel prep to assist with what appears to be chronic constipation as etiology of his symptoms.    Patient was able to have a bowel movement and his diet was slowly advanced with toleration.  Will continue Senokot 1 tablet p.o. twice daily with MiraLAX as needed as needed.  Seen by GI today and okay for discharge home with outpatient follow-up with gastroenterology.  Essential hypertension BP 123/74, well controlled. Metoprolol tartrate 25 mg p.o. twice daily, Imdur 30 mg p.o. daily,  spironolactone 25 mg p.o. daily. Continue aspirin and statin  Type 2 diabetes mellitus Hemoglobin  A1c 7.7 08/29/2019, not optimally controlled.    Continue home metformin 500 mg p.o. daily, NPH 45 units every morning and 35 units qPM at home.  Outpatient follow-up with PCP for further management.  History of seizure Disorder Continue Keppra 500 mg p.o. twice daily  Hypothyroidism TSH elevated at 10.740, free T4 1.06, within normal limits. Continue levothyroxine 100 mcg p.o. daily.  Gout: Continue allopurinol 300 mg p.o. daily  Chronic hypoxemic respiratory failure COPD Patient on supplemental oxygen, 2 L nasal cannula at baseline. Continue Symbicort   GERD: Continue Protonix 40 mg p.o. daily  B12 deficiency: Continue cyanocobalamin 1000 mcg p.o. daily  Hyperlipidemia: Continue pravastatin 80 mg p.o. daily  History of CVA Continue aspirin, statin, Plavix  Discharge Diagnoses:  Principal Problem:   Ileus ([place]) Active Problems:   Mixed hyperlipidemia   Morbid obesity (HCC)   Essential hypertension   CAD (coronary artery disease)   Type II diabetes mellitus (HCC)   Normocytic anemia   GERD (gastroesophageal reflux disease)   Cerebral infarction (HCC)   Hypothyroidism   OSA on CPAP   Pain in the abdomen    Discharge Instructions  Discharge Instructions    Call MD for:  difficulty breathing, headache or visual disturbances   Complete by: As directed    Call MD for:  extreme fatigue   Complete by: As directed    Call MD for:  persistant dizziness or light-headedness   Complete by: As directed    Call MD for:  persistant nausea and vomiting   Complete by: As directed    Call MD for:  severe uncontrolled pain   Complete by: As directed    Call MD for:  temperature >100.4   Complete by: As directed    Diet - low sodium heart healthy   Complete by: As directed    Increase activity slowly   Complete by: As directed      Allergies as of 09/04/2019   No Known  Allergies     Medication List    STOP taking these medications   senna 8.6 MG tablet Commonly known as: SENOKOT     TAKE these medications   acetaminophen 500 MG tablet Commonly known as: TYLENOL Take 1,000 mg by mouth every 6 (six) hours as needed for moderate pain (left thigh).   albuterol 108 (90 Base) MCG/ACT inhaler Commonly known as: VENTOLIN HFA Inhale 1-2 puffs into the lungs every 6 (six) hours as needed for wheezing or shortness of breath.   allopurinol 300 MG tablet Commonly known as: ZYLOPRIM Take 1 tablet (300 mg total) by mouth daily.   aspirin EC 81 MG tablet Take 81 mg by mouth daily.   budesonide-formoterol 80-4.5 MCG/ACT inhaler Commonly known as: Symbicort Inhale 2 puffs into the lungs daily.   clopidogrel 75 MG tablet Commonly known as: PLAVIX TAKE 1 TABLET (75 MG TOTAL) BY MOUTH DAILY.   isosorbide mononitrate 30 MG 24 hr tablet Commonly known as: IMDUR Take 30 mg by mouth daily.   levETIRAcetam 500 MG tablet Commonly known as: KEPPRA TAKE 1 TABLET TWICE DAILY   levothyroxine 100 MCG tablet Commonly known as: SYNTHROID TAKE 1 TABLET (100 MCG TOTAL) BY MOUTH DAILY BEFORE BREAKFAST.   metFORMIN 500 MG tablet Commonly known as: GLUCOPHAGE TAKE 1 TABLET EVERY DAY WITH BREAKFAST What changed: See the new instructions.   metoprolol tartrate 50 MG tablet Commonly known as: LOPRESSOR TAKE 1/2 TABLET TWICE DAILY   nitroGLYCERIN 0.4 MG SL tablet Commonly known as:  Nitrostat Place 1 tablet (0.4 mg total) under the tongue every 5 (five) minutes as needed for chest pain.   NovoLIN 70/30 (70-30) 100 UNIT/ML injection Generic drug: insulin NPH-regular Human Inject 35-45 Units into the skin See admin instructions. INJECT 45 UNITS SUBCUTANEOUSLY WITH BREAKFAST AND 35 UNITS WITH SUPPER (WHEN GLUCOSE IS ABOVE 90)   pantoprazole 40 MG tablet Commonly known as: PROTONIX TAKE 1 TABLET EVERY DAY BEFORE BREAKFAST What changed: See the new instructions.    polyethylene glycol 17 g packet Commonly known as: MIRALAX / GLYCOLAX Take 17 g by mouth daily as needed.   pravastatin 40 MG tablet Commonly known as: PRAVACHOL TAKE 2 TABLETS EVERY DAY   senna-docusate 8.6-50 MG tablet Commonly known as: Senokot-S Take 1 tablet by mouth 2 (two) times daily.   spironolactone 25 MG tablet Commonly known as: ALDACTONE TAKE 1 TABLET EVERY DAY   vitamin B-12 1000 MCG tablet Commonly known as: CYANOCOBALAMIN Take 1,000 mcg by mouth daily.      Follow-up Information    Fayrene Helper, MD. Schedule an appointment as soon as possible for a visit in 1 week(s).   Specialty: Family Medicine Contact information: [address] [place] [address] [phone]          No Known Allergies  Consultations:  General surgery  Gastroenterology   Procedures/Studies: DG Abd 1 View  Result Date: 08/30/2019 CLINICAL DATA:  Small bowel obstruction EXAM: ABDOMEN - 1 VIEW COMPARISON:  August 29, 2019 FINDINGS: There is contrast throughout the colon currently. A small amount of residual contrast is noted in the distal ileum. There remain loops of slightly dilated small bowel without air-fluid levels. No free air evident on this supine examination. Foci of arterial vascular calcification noted in the pelvis. IMPRESSION: Most of the contrast is now seen in the colon. Contrast reaches the rectum. Small amount of contrast remains in the distal small bowel. There remains mildly dilated small bowel which may be indicative of residual degree of partial bowel obstruction or ileus. No evident free air. Electronically Signed   By: Lowella Grip III M.D.   On: 08/30/2019 09:16   DG Abd 1 View  Result Date: 08/29/2019 CLINICAL DATA:  Small-bowel obstruction, generalized abdominal pain EXAM: ABDOMEN - 1 VIEW COMPARISON:  Portable exam at 1230 hours compared to abdominal radiograph and CT abdomen/pelvis of 08/27/2019 FINDINGS: Numerous air-filled  loops of small bowel throughout abdomen. Scattered small amount of gas in colon. Neuro definite bowel dilatation or bowel wall thickening. Bones demineralized. IMPRESSION: Numerous air-filled normal caliber loops of small bowel throughout abdomen with less colonic gas. Electronically Signed   By: Lavonia Dana M.D.   On: 08/29/2019 12:50   CT ABDOMEN PELVIS W CONTRAST  Result Date: 09/02/2019 CLINICAL DATA:  Generalized abdominal pain since last Wednesday, 9/10. Nausea and vomiting EXAM: CT ABDOMEN AND PELVIS WITH CONTRAST TECHNIQUE: Multidetector CT imaging of the abdomen and pelvis was performed using the standard protocol following bolus administration of intravenous contrast. CONTRAST:  112mL OMNIPAQUE IOHEXOL 300 MG/ML  SOLN COMPARISON:  CT six days ago FINDINGS: Lower chest: Heavily calcified coronary arteries. Mild cylindrical bronchiectasis and scarring in the lower lobes. Hepatobiliary: No focal liver abnormality.No evidence of biliary obstruction or stone. Pancreas: Unremarkable. Spleen: Unremarkable. Adrenals/Urinary Tract: Negative adrenals. No hydronephrosis or stone. 5.2 cm right renal cyst. Unremarkable bladder. Stomach/Bowel: There are some distended small bowel loops with fluid levels but no transition point. The colon contains oral contrast and gas throughout.  No bowel wall thickening. Vascular/Lymphatic: No acute vascular abnormality. No mass or adenopathy. Reproductive:No pathologic findings. Other: No ascites or pneumoperitoneum. Musculoskeletal: No acute abnormalities. Advanced lumbar spine degeneration with spinal and foraminal stenoses. IMPRESSION: 1. No transition point to suggest ongoing small bowel obstruction. Loops are less distended than on recent prior and ascites has resolved. 2. Lower lobe scarring and mild bronchiectasis. 3.  Aortic Atherosclerosis (ICD10-I70.0).  Coronary atherosclerosis. Electronically Signed   By: Monte Fantasia M.D.   On: 09/02/2019 09:54   CT ABDOMEN  PELVIS W CONTRAST  Result Date: 08/28/2019 CLINICAL DATA:  Abdominal pain and vomiting. EXAM: CT ABDOMEN AND PELVIS WITH CONTRAST TECHNIQUE: Multidetector CT imaging of the abdomen and pelvis was performed using the standard protocol following bolus administration of intravenous contrast. CONTRAST:  158mL OMNIPAQUE IOHEXOL 300 MG/ML  SOLN COMPARISON:  August 26, 2019 FINDINGS: Lower chest: Mild linear scarring and/or atelectasis is seen within the bilateral lung bases. Hepatobiliary: No focal liver abnormality is seen. No gallstones, gallbladder wall thickening, or biliary dilatation. Pancreas: Unremarkable. No pancreatic ductal dilatation or surrounding inflammatory changes. Spleen: Normal in size without focal abnormality. Adrenals/Urinary Tract: Adrenal glands are unremarkable. Kidneys are normal in size, without renal calculi or hydronephrosis. A 5.4 cm x 4.7 cm simple cyst is seen within the posteromedial aspect of the mid right kidney. A 1.5 cm cyst is seen within the posterolateral aspect of the mid left kidney. Bladder is unremarkable. Stomach/Bowel: Stomach is within normal limits. The appendix is not clearly identified. Multiple dilated small bowel loops are seen throughout the abdomen and pelvis. A clear transition zone is not identified. Vascular/Lymphatic: Marked severity aortic calcification. No enlarged abdominal or pelvic lymph nodes. Reproductive: Prostate is unremarkable. Other: There is a small amount of perihepatic fluid. A small amount of pelvic free fluid is also seen. Musculoskeletal: Multilevel degenerative changes seen throughout the lumbar spine. IMPRESSION: 1. Multiple dilated small bowel loops throughout the abdomen and pelvis consistent with a distal small bowel obstruction. 2. Small amount of ascites. 3. Bilateral simple renal cysts. Aortic Atherosclerosis (ICD10-I70.0). Electronically Signed   By: Virgina Norfolk M.D.   On: 08/28/2019 00:05   CT ABDOMEN PELVIS W CONTRAST  Result  Date: 08/26/2019 CLINICAL DATA:  Bowel obstruction, abdominal distension. EXAM: CT ABDOMEN AND PELVIS WITH CONTRAST TECHNIQUE: Multidetector CT imaging of the abdomen and pelvis was performed using the standard protocol following bolus administration of intravenous contrast. CONTRAST:  145mL OMNIPAQUE IOHEXOL 300 MG/ML  SOLN COMPARISON:  11/15/2009 FINDINGS: Lower chest: Basilar bronchiectasis and tree-in-bud opacities at the lung bases bilaterally. No significant septal thickening. No consolidation. No pleural effusion. Calcified coronary artery disease likely with prior percutaneous coronary intervention. Heart is incompletely imaged. No pericardial effusion. Hepatobiliary: No focal hepatic lesion. No ductal dilation. No pericholecystic stranding. Pancreas: Pancreas is normal without focal lesion, ductal dilation or peripancreatic inflammation. Spleen: Spleen will size without focal lesion. Adrenals/Urinary Tract: Adrenal glands are normal. Upper pole cyst arising from the right kidney similar to the prior study. No hydronephrosis. Urinary bladder is normal. Small low-density lesion in the left kidney measures water density also likely a small cyst. Renal enhancement otherwise symmetric. Stomach/Bowel: Stomach mildly distended. Small bowel of normal caliber without acute process. Colon is filled with liquid and partially formed stool. Vascular/Lymphatic: Calcified and noncalcified atherosclerotic plaque in the abdominal aorta. No added Coralie Common in the upper abdomen or in the retroperitoneum. No pelvic lymphadenopathy. Reproductive: Prostate mildly heterogeneous, nonspecific on CT. Other: No free air. No ascites. Musculoskeletal:  No acute bone finding or destructive bone process. IMPRESSION: 1. No bowel obstruction. 2. Abundant stool in the colon both liquid and formed. 3. Basilar bronchiectasis and tree-in-bud opacities at the lung bases bilaterally, findings may be related to aspiration or atypical infection with  chronicity, correlate with any risk of aspiration. 4. Aortic atherosclerosis. Aortic Atherosclerosis (ICD10-I70.0). Electronically Signed   By: Zetta Bills M.D.   On: 08/26/2019 19:02   DG Abdomen Acute W/Chest  Result Date: 08/27/2019 CLINICAL DATA:  Abdominal pain. EXAM: DG ABDOMEN ACUTE W/ 1V CHEST COMPARISON:  May 18, 2015 FINDINGS: Multiple dilated small bowel loops are seen overlying the mid abdomen. No radiopaque calculi or other significant radiographic abnormality is seen. Heart size and mediastinal contours are within normal limits. Both lungs are clear. IMPRESSION: Findings consistent with small-bowel obstruction. Electronically Signed   By: Virgina Norfolk M.D.   On: 08/27/2019 23:01   DG Abd Portable 1V-Small Bowel Obstruction Protocol-initial, 8 hr delay  Result Date: 08/29/2019 CLINICAL DATA:  Small-bowel obstruction, abdominal pain EXAM: PORTABLE ABDOMEN - 1 VIEW COMPARISON:  08/29/2019 FINDINGS: 2 supine frontal views of the abdomen and pelvis are obtained, excluding the hemidiaphragms in left flank by collimation. Oral contrast has been administered, and is now seen throughout the colon to the level of the rectum. Continued gas-filled dilated loops of small bowel within the central abdomen. IMPRESSION: 1. Interval passage of oral contrast into the colon, excluding high-grade small bowel obstruction. 2. Stable nonspecific gaseous distention of the small bowel. Electronically Signed   By: Randa Ngo M.D.   On: 08/29/2019 21:22   DG Abd Portable 2 Views  Result Date: 09/01/2019 CLINICAL DATA:  Vomiting with small bowel obstruction EXAM: PORTABLE ABDOMEN - 2 VIEW COMPARISON:  None. FINDINGS: Single mildly dilated loop of small bowel in the central abdomen. Bowel gas pattern otherwise unremarkable. Lung bases are clear. IMPRESSION: Single mildly dilated loop of small bowel in the central abdomen. Electronically Signed   By: Ulyses Jarred M.D.   On: 09/01/2019 00:35       Subjective: Patient seen and examined at bedside, resting in bedside chair.  States is ready for discharge home.  Tolerating his diet without nausea, vomiting.  Reports bowel movement.  Abdominal pain has resolved.  No acute concerns this morning.  Denies headache, no fever/chills/night sweats, no nausea/vomiting/diarrhea, no chest pain, no palpitations, no shortness of breath, no abdominal pain, no weakness, no paresthesias.  No acute events overnight per nursing staff.  Discharge Exam: Vitals:   09/04/19 0344 09/04/19 0922  BP: 129/63 (!) 156/74  Pulse: 65 80  Resp: 16 18  Temp: 98.3 F (36.8 C)   SpO2: 97% 90%   Vitals:   09/03/19 1921 09/04/19 0344 09/04/19 0500 09/04/19 0922  BP: 133/70 129/63  (!) 156/74  Pulse: 70 65  80  Resp: 16 16  18   Temp: 98.5 F (36.9 C) 98.3 F (36.8 C)    TempSrc: Oral Oral    SpO2: 97% 97%  90%  Weight:   117.6 kg   Height:        General: Pt is alert, awake, not in acute distress Cardiovascular: RRR, S1/S2 +, no rubs, no gallops Respiratory: CTA bilaterally, no wheezing, no rhonchi Abdominal: Soft, NT, ND, bowel sounds + Extremities: no edema, no cyanosis    The results of significant diagnostics from this hospitalization (including imaging, microbiology, ancillary and laboratory) are listed below for reference.     Microbiology: Recent Results (from the past  240 hour(s))  Urine culture     Status: Abnormal   Collection Time: 08/26/19  7:43 PM   Specimen: Urine, Clean Catch  Result Value Ref Range Status   Specimen Description   Final    URINE, CLEAN CATCH Performed at [facility], [address], [place], [place] [address]    Special Requests   Final    NONE Performed at [facility], [address] [place], [place] [address]    Culture (A)  Final    >=100,000 COLONIES/mL DIPHTHEROIDS(CORYNEBACTERIUM SPECIES) Standardized susceptibility testing for this organism is not available. Performed at [facility],  [place] [address], [place], [place] [address]    Report Status 08/28/2019 FINAL  Final  SARS CORONAVIRUS 2 (TAT 6-24 HRS) Nasopharyngeal Nasopharyngeal Swab     Status: None   Collection Time: 08/27/19  9:20 PM   Specimen: Nasopharyngeal Swab  Result Value Ref Range Status   SARS Coronavirus 2 NEGATIVE NEGATIVE Final    Comment: (NOTE) SARS-CoV-2 target nucleic acids are NOT DETECTED. The SARS-CoV-2 RNA is generally detectable in upper and lower respiratory specimens during the acute phase of infection. Negative results do not preclude SARS-CoV-2 infection, do not rule out co-infections with other pathogens, and should not be used as the sole basis for treatment or other patient management decisions. Negative results must be combined with clinical observations, patient history, and epidemiological information. The expected result is Negative. Fact Sheet for Patients: SugarRoll.be Fact Sheet for Healthcare Providers: https://www.woods-mathews.com/ This test is not yet approved or cleared by the [place] FDA and  has been authorized for detection and/or diagnosis of SARS-CoV-2 by FDA under an Emergency Use Authorization (EUA). This EUA will remain  in effect (meaning this test can be used) for the duration of the COVID-19 declaration under Section 56 4(b)(1) of the Act, 21 U.S.C. section 360bbb-3(b)(1), unless the authorization is terminated or revoked sooner. Performed at [facility], [place] [address] [place], [place] [address]   SARS CORONAVIRUS 2 (TAT 6-24 HRS) Nasopharyngeal Nasopharyngeal Swab     Status: None   Collection Time: 09/02/19  4:00 PM   Specimen: Nasopharyngeal Swab  Result Value Ref Range Status   SARS Coronavirus 2 NEGATIVE NEGATIVE Final    Comment: (NOTE) SARS-CoV-2 target nucleic acids are NOT DETECTED. The SARS-CoV-2 RNA is generally detectable in upper and lower respiratory specimens during the acute phase of  infection. Negative results do not preclude SARS-CoV-2 infection, do not rule out co-infections with other pathogens, and should not be used as the sole basis for treatment or other patient management decisions. Negative results must be combined with clinical observations, patient history, and epidemiological information. The expected result is Negative. Fact Sheet for Patients: SugarRoll.be Fact Sheet for Healthcare Providers: https://www.woods-mathews.com/ This test is not yet approved or cleared by the [place] FDA and  has been authorized for detection and/or diagnosis of SARS-CoV-2 by FDA under an Emergency Use Authorization (EUA). This EUA will remain  in effect (meaning this test can be used) for the duration of the COVID-19 declaration under Section 56 4(b)(1) of the Act, 21 U.S.C. section 360bbb-3(b)(1), unless the authorization is terminated or revoked sooner. Performed at [facility], [place] [address] [place], [place] [address]      Labs: BNP (last 3 results) No results for input(s): BNP in the last 8760 hours. Basic Metabolic Panel: Recent Labs  Lab 08/29/19 0439 08/29/19 0439 08/30/19 0417 08/31/19 2339 09/01/19 2308 09/02/19 1834 09/03/19  0449 09/04/19 0255  NA 138   < > 138 135 137  --  139 136  K 4.6   < > 4.7 4.0 4.4  --  4.1 4.2  CL 103   < > 104 103 101  --  103 101  CO2 27   < > 26 25 25   --  28 26  GLUCOSE 109*   < > 101* 81 126*  --  114* 138*  BUN 13   < > 12 17 11   --  11 11  CREATININE 1.11   < > 1.08 1.39* 1.10  --  1.18 1.25*  CALCIUM 8.5*   < > 8.5* 8.4* 9.3  --  8.2* 8.0*  MG 1.6*  --  2.0  --   --  1.6*  --   --   PHOS 3.9  --   --   --   --  3.4  --   --    < > = values in this interval not displayed.   Liver Function Tests: Recent Labs  Lab 09/01/19 2308 09/03/19 0449  AST 22 21  ALT 19 23  ALKPHOS 67 55  BILITOT 0.7 0.6  PROT 7.3 6.1*  ALBUMIN 3.6 2.8*   Recent Labs  Lab  09/01/19 2308  LIPASE 35   No results for input(s): AMMONIA in the last 168 hours. CBC: Recent Labs  Lab 08/29/19 0439 08/31/19 2339 09/01/19 2308 09/03/19 0449  WBC 10.9* 8.3 10.9* 10.1  NEUTROABS  --  5.6  --   --   HGB 10.8* 10.2* 11.7* 9.8*  HCT 37.0* 33.8* 38.2* 32.0*  MCV 94.4 92.1 89.0 90.4  PLT 276 248 280 257   Cardiac Enzymes: No results for input(s): CKTOTAL, CKMB, CKMBINDEX, TROPONINI in the last 168 hours. BNP: Invalid input(s): POCBNP CBG: Recent Labs  Lab 09/02/19 2133 09/03/19 0657 09/03/19 1202 09/03/19 1651 09/03/19 2029  GLUCAP 100* 95 170* 131* 115*   D-Dimer No results for input(s): DDIMER in the last 72 hours. Hgb A1c No results for input(s): HGBA1C in the last 72 hours. Lipid Profile No results for input(s): CHOL, HDL, LDLCALC, TRIG, CHOLHDL, LDLDIRECT in the last 72 hours. Thyroid function studies Recent Labs    09/02/19 2027  TSH 10.740*   Anemia work up No results for input(s): VITAMINB12, FOLATE, FERRITIN, TIBC, IRON, RETICCTPCT in the last 72 hours. Urinalysis    Component Value Date/Time   COLORURINE YELLOW 09/02/2019 [place] 09/02/2019 0951   LABSPEC 1.018 09/02/2019 [place] 5.0 09/02/2019 [place] 09/02/2019 0951   HGBUR NEGATIVE 09/02/2019 0951   BILIRUBINUR NEGATIVE 09/02/2019 0951   BILIRUBINUR negative 11/13/2014 1640   KETONESUR NEGATIVE 09/02/2019 0951   PROTEINUR NEGATIVE 09/02/2019 0951   UROBILINOGEN 0.2 11/13/2014 1640   UROBILINOGEN 0.2 05/09/2013 2015   NITRITE NEGATIVE 09/02/2019 0951   LEUKOCYTESUR NEGATIVE 09/02/2019 0951   Sepsis Labs Invalid input(s): PROCALCITONIN,  WBC,  LACTICIDVEN Microbiology Recent Results (from the past 240 hour(s))  Urine culture     Status: Abnormal   Collection Time: 08/26/19  7:43 PM   Specimen: Urine, Clean Catch  Result Value Ref Range Status   Specimen Description   Final    URINE, CLEAN CATCH Performed at [facility], [address] [place], [place] [address]    Special Requests   Final    NONE Performed at [facility], [address], [place], [place] [address]  Culture (A)  Final    >=100,000 COLONIES/mL DIPHTHEROIDS(CORYNEBACTERIUM SPECIES) Standardized susceptibility testing for this organism is not available. Performed at [facility], [place] [address], [place], [place] [address]    Report Status 08/28/2019 FINAL  Final  SARS CORONAVIRUS 2 (TAT 6-24 HRS) Nasopharyngeal Nasopharyngeal Swab     Status: None   Collection Time: 08/27/19  9:20 PM   Specimen: Nasopharyngeal Swab  Result Value Ref Range Status   SARS Coronavirus 2 NEGATIVE NEGATIVE Final    Comment: (NOTE) SARS-CoV-2 target nucleic acids are NOT DETECTED. The SARS-CoV-2 RNA is generally detectable in upper and lower respiratory specimens during the acute phase of infection. Negative results do not preclude SARS-CoV-2 infection, do not rule out co-infections with other pathogens, and should not be used as the sole basis for treatment or other patient management decisions. Negative results must be combined with clinical observations, patient history, and epidemiological information. The expected result is Negative. Fact Sheet for Patients: SugarRoll.be Fact Sheet for Healthcare Providers: https://www.woods-mathews.com/ This test is not yet approved or cleared by the [place] FDA and  has been authorized for detection and/or diagnosis of SARS-CoV-2 by FDA under an Emergency Use Authorization (EUA). This EUA will remain  in effect (meaning this test can be used) for the duration of the COVID-19 declaration under Section 56 4(b)(1) of the Act, 21 U.S.C. section 360bbb-3(b)(1), unless the authorization is terminated or revoked sooner. Performed at [facility], [place] [address] [place], [place] [address]   SARS CORONAVIRUS 2 (TAT 6-24 HRS) Nasopharyngeal Nasopharyngeal  Swab     Status: None   Collection Time: 09/02/19  4:00 PM   Specimen: Nasopharyngeal Swab  Result Value Ref Range Status   SARS Coronavirus 2 NEGATIVE NEGATIVE Final    Comment: (NOTE) SARS-CoV-2 target nucleic acids are NOT DETECTED. The SARS-CoV-2 RNA is generally detectable in upper and lower respiratory specimens during the acute phase of infection. Negative results do not preclude SARS-CoV-2 infection, do not rule out co-infections with other pathogens, and should not be used as the sole basis for treatment or other patient management decisions. Negative results must be combined with clinical observations, patient history, and epidemiological information. The expected result is Negative. Fact Sheet for Patients: SugarRoll.be Fact Sheet for Healthcare Providers: https://www.woods-mathews.com/ This test is not yet approved or cleared by the [place] FDA and  has been authorized for detection and/or diagnosis of SARS-CoV-2 by FDA under an Emergency Use Authorization (EUA). This EUA will remain  in effect (meaning this test can be used) for the duration of the COVID-19 declaration under Section 56 4(b)(1) of the Act, 21 U.S.C. section 360bbb-3(b)(1), unless the authorization is terminated or revoked sooner. Performed at [facility], [place] [address] [place], [place] [address]      Time coordinating discharge: Over 30 minutes  SIGNED:   Ura Yingling J [place] DO  [facility] 09/04/2019, 9:26 AM

## 2019-09-05 LAB — T3, FREE: T3, Free: 1.8 pg/mL — ABNORMAL LOW (ref 2.0–4.4)

## 2019-09-06 ENCOUNTER — Other Ambulatory Visit: Payer: Self-pay

## 2019-09-06 ENCOUNTER — Encounter: Payer: Self-pay | Admitting: Family Medicine

## 2019-09-06 ENCOUNTER — Ambulatory Visit: Payer: Medicare HMO | Admitting: Family Medicine

## 2019-09-06 VITALS — BP 173/63 | HR 74 | Temp 97.6°F | Ht 70.5 in | Wt 258.4 lb

## 2019-09-06 DIAGNOSIS — E039 Hypothyroidism, unspecified: Secondary | ICD-10-CM

## 2019-09-06 DIAGNOSIS — K5900 Constipation, unspecified: Secondary | ICD-10-CM | POA: Diagnosis not present

## 2019-09-06 DIAGNOSIS — D649 Anemia, unspecified: Secondary | ICD-10-CM | POA: Diagnosis not present

## 2019-09-06 MED ORDER — LEVOTHYROXINE SODIUM 112 MCG PO TABS: 112.0000 ug | ORAL_TABLET | Freq: Every day | ORAL | 1 refills | Status: DC

## 2019-09-06 NOTE — Patient Instructions (Addendum)
GI referral for anemia/constipation Increase synthroid to 112 mcg daily Taking Senokot twice a day Taking miralax daily Increase water Avoid soda If you have lab work done today you will be contacted with your lab results within the next 2 weeks.  If you have not heard from [place] then please contact us. The fastest way to get your results is to register for My Chart.   IF you received an x-ray today, you will receive an invoice from [facility]. Please contact [facility] at [phone] with questions or concerns regarding your invoice.   IF you received labwork today, you will receive an invoice from [facility]. Please contact [facility] at [phone] with questions or concerns regarding your invoice.   Our billing staff will not be able to assist you with questions regarding bills from these companies.  You will be contacted with the lab results as soon as they are available. The fastest way to get your results is to activate your My Chart account. Instructions are located on the last page of this paperwork. If you have not heard from [place] regarding the results in 2 weeks, please contact this office.

## 2019-09-06 NOTE — Progress Notes (Signed)
Established Patient Office Visit  Subjective:  Patient ID: [name], male    DOB: 1940/11/11  Age: 79 y.o. MRN: [id number]  CC:  Chief Complaint  Patient presents with  . Hospitalization Follow-up    hospital f/u form 08/30/19 for abdnomal pain and constipation  Recommendations for Outpatient Follow-up:  1. Follow up with PCP in 1-2 weeks 2. Continue Senokot 1 tablet twice daily with MiraLAX as needed to ensure bowel movements daily versus every other day 3. Outpatient follow-up with gastroenterology colonoscopy with poor prep 2019 IMPRESSION:CT abdomen-4/16 1. No transition point to suggest ongoing small bowel obstruction. Loops are less distended than on recent prior and ascites has resolved. 2. Lower lobe scarring and mild bronchiectasis. 3.  Aortic Atherosclerosis (ICD10-I70.0).  Coronary atherosclerosis. LABWORK-Cr 1.25, GFR 60, H/H 9.8/32, TSH 10.740, u/a no blood. Lipase 35 Medication 09-02-19  iohexol (OMNIPAQUE) 300 MG/ML solution 100 mL (Completed)  Route: Intravenous  Admin Amount: 100 mL  Volume: 100 mL  PRN Reason(s): contrast  Last Admin Time: 09/02/19 0932  Number of Expected Doses: 1   HPI [name] presents for hospital follow up for constipation-pt with long term concern with constipation. Pt states normal BM 2x/week. Pt was not taking serokot or miralax on a regular basis. Pt with elevated TSH-No recent dosage change. Pt drinks soda and not much water. Pt denies blood in stool or urine-anemia noted on blood work. colonscopy with poor prep in 2019 Glucose 118 fasting this morning Past Medical History:  Diagnosis Date  . Abnormality of gait 05/01/2015  . Anemia 04/19/2015  . Annual physical exam 12/11/2013  . Arthritis    "left leg; knees" (05/07/2017)  . Arthritis of knee   . CAD (coronary artery disease)   . Choking 07/13/2015  . Chronic bronchitis ([place])    "get it q yr"  . Chronic lower back pain    "since I was a teen"  (05/07/2017)  . COPD (chronic obstructive pulmonary disease) ([place])   . CVA (cerebral vascular accident) ([place]) 05/2015   R sided weakness (05/07/2017)  . GERD (gastroesophageal reflux disease)   . Gout   . History of colonic polyps 04/03/2017   Added automatically from request for surgery [id number]  . Hyperlipidemia   . Hypertension   . Myocardial infarction ([place]) 1999  . Obesity   . OSA on CPAP   . Pneumonia    "a few times; last time was ~ 06/2013" (05/07/2017)  . Psoriasis   . Tussive syncope 05/01/2015  . Type II diabetes mellitus ([place])   . Vitamin B12 deficiency 05/01/2015    Past Surgical History:  Procedure Laterality Date  . APPENDECTOMY    . CATARACT EXTRACTION W/PHACO Left 10/07/2016   Procedure: CATARACT EXTRACTION PHACO AND INTRAOCULAR LENS PLACEMENT (IOC);  Surgeon: Rutherford Guys, MD;  Location: [facility];  Service: Ophthalmology;  Laterality: Left;  CDE: 13.49  . CATARACT EXTRACTION W/PHACO Right 10/28/2016   Procedure: CATARACT EXTRACTION PHACO AND INTRAOCULAR LENS PLACEMENT (IOC);  Surgeon: Rutherford Guys, MD;  Location: [facility];  Service: Ophthalmology;  Laterality: Right;  CDE: 16.71  . COLONOSCOPY  03/17/2012   Procedure: COLONOSCOPY;  Surgeon: Rogene Houston, MD;  Location: [facility];  Service: Endoscopy;  Laterality: N/A;  830  . COLONOSCOPY N/A 06/25/2017   Procedure: COLONOSCOPY;  Surgeon: Rogene Houston, MD;  Location: [facility];  Service: Endoscopy;  Laterality: N/A;  [place]- 2009-08/30/2013   "  counting today's, I have 5 stents" (08/30/2013)  . CORONARY ANGIOPLASTY WITH STENT PLACEMENT  05/07/2017  . CORONARY STENT INTERVENTION N/A 05/07/2017   Procedure: CORONARY STENT INTERVENTION;  Surgeon: Charolette Forward, MD;  Location: [place] [place] [facility];  Service: Cardiovascular;  Laterality: N/A;  . CYSTOSCOPY WITH URETHRAL DILATATION N/A 11/26/2012   Procedure: CYSTOSCOPY WITH URETHRAL DILATATION;  Surgeon: Malka So,  MD;  Location: [facility];  Service: Urology;  Laterality: N/A;  . LEFT HEART CATH AND CORONARY ANGIOGRAPHY N/A 05/07/2017   Procedure: LEFT HEART CATH AND CORONARY ANGIOGRAPHY;  Surgeon: Charolette Forward, MD;  Location: [place] [facility];  Service: Cardiovascular;  Laterality: N/A;  . LEFT HEART CATHETERIZATION WITH CORONARY ANGIOGRAM N/A 08/30/2013   Procedure: LEFT HEART CATHETERIZATION WITH CORONARY ANGIOGRAM;  Surgeon: Clent Demark, MD;  Location: [facility] CATH LAB;  Service: Cardiovascular;  Laterality: N/A;  . PERCUTANEOUS CORONARY STENT INTERVENTION (PCI-S) Right 08/30/2013   Procedure: PERCUTANEOUS CORONARY STENT INTERVENTION (PCI-S);  Surgeon: Clent Demark, MD;  Location: [facility];  Service: Cardiovascular;  Laterality: Right;    Family History  Problem Relation Age of Onset  . Hypertension Mother   . Diabetes Mother   . Hypertension Father   . Diabetes Brother   . Stroke Daughter   . Arthritis Other        Family History   . Diabetes Other        family History     Social History   Socioeconomic History  . Marital status: Married    Spouse name: Not on file  . Number of children: 2  . Years of education: 21  . Highest education level: 12th grade  Occupational History  . Occupation: Brewing technologist     Comment: retired   Tobacco Use  . Smoking status: Former Smoker    Packs/day: 0.50    Years: 33.00    Pack years: 16.50    Types: Cigarettes    Quit date: 11/29/2002    Years since quitting: 16.7  . Smokeless tobacco: Never Used  Substance and Sexual Activity  . Alcohol use: No  . Drug use: No  . Sexual activity: Not Currently  Other Topics Concern  . Not on file  Social History Narrative   Patient drinks about 3 cups of soda daily.    Patient is left handed.    Lives alone with wife    Social Determinants of Health   Financial Resource Strain:   . Difficulty of Paying Living Expenses:   Food Insecurity: Unknown  . Worried About Charity fundraiser in  the Last Year: Not on file  . Ran Out of Food in the Last Year: Never true  Transportation Needs: No Transportation Needs  . Lack of Transportation (Medical): No  . Lack of Transportation (Non-Medical): No  Physical Activity: Inactive  . Days of Exercise per Week: 0 days  . Minutes of Exercise per Session: 0 min  Stress: No Stress Concern Present  . Feeling of Stress : Not at all  Social Connections: Somewhat Isolated  . Frequency of Communication with Friends and Family: Twice a week  . Frequency of Social Gatherings with Friends and Family: Once a week  . Attends Religious Services: Never  . Active Member of Clubs or Organizations: No  . Attends Archivist Meetings: Never  . Marital Status: Married  Human resources officer Violence:   . Fear of Current or Ex-Partner:   . Emotionally Abused:   Marland Kitchen Physically  Abused:   . Sexually Abused:     Outpatient Medications Prior to Visit  Medication Sig Dispense Refill  . acetaminophen (TYLENOL) 500 MG tablet Take 1,000 mg by mouth every 6 (six) hours as needed for moderate pain (left thigh).     Marland Kitchen albuterol (VENTOLIN HFA) 108 (90 Base) MCG/ACT inhaler Inhale 1-2 puffs into the lungs every 6 (six) hours as needed for wheezing or shortness of breath. 8 g 1  . allopurinol (ZYLOPRIM) 300 MG tablet Take 1 tablet (300 mg total) by mouth daily. 90 tablet 1  . aspirin EC 81 MG tablet Take 81 mg by mouth daily.    . budesonide-formoterol (SYMBICORT) 80-4.5 MCG/ACT inhaler Inhale 2 puffs into the lungs daily. 1 Inhaler 12  . clopidogrel (PLAVIX) 75 MG tablet TAKE 1 TABLET (75 MG TOTAL) BY MOUTH DAILY. 90 tablet 2  . insulin NPH-regular Human (NOVOLIN 70/30) (70-30) 100 UNIT/ML injection Inject 35-45 Units into the skin See admin instructions. INJECT 45 UNITS SUBCUTANEOUSLY WITH BREAKFAST AND 35 UNITS WITH SUPPER (WHEN GLUCOSE IS ABOVE 90) 30 mL 0  . isosorbide mononitrate (IMDUR) 30 MG 24 hr tablet Take 30 mg by mouth daily.    Marland Kitchen levETIRAcetam  (KEPPRA) 500 MG tablet TAKE 1 TABLET TWICE DAILY (Patient taking differently: Take 500 mg by mouth 2 (two) times daily. ) 180 tablet 1  . levothyroxine (SYNTHROID) 100 MCG tablet TAKE 1 TABLET (100 MCG TOTAL) BY MOUTH DAILY BEFORE BREAKFAST. 90 tablet 1  . metFORMIN (GLUCOPHAGE) 500 MG tablet TAKE 1 TABLET EVERY DAY WITH BREAKFAST (Patient taking differently: Take 500 mg by mouth daily with breakfast. ) 90 tablet 0  . metoprolol tartrate (LOPRESSOR) 50 MG tablet TAKE 1/2 TABLET TWICE DAILY (Patient taking differently: Take 25 mg by mouth 2 (two) times daily. ) 90 tablet 1  . pantoprazole (PROTONIX) 40 MG tablet TAKE 1 TABLET EVERY DAY BEFORE BREAKFAST (Patient taking differently: Take 40 mg by mouth daily. ) 90 tablet 2  . polyethylene glycol (MIRALAX / GLYCOLAX) 17 g packet Take 17 g by mouth daily as needed. 14 each 0  . pravastatin (PRAVACHOL) 40 MG tablet TAKE 2 TABLETS EVERY DAY (Patient taking differently: Take 80 mg by mouth daily. ) 180 tablet 1  . senna-docusate (SENOKOT-S) 8.6-50 MG tablet Take 1 tablet by mouth 2 (two) times daily. 60 tablet 0  . spironolactone (ALDACTONE) 25 MG tablet TAKE 1 TABLET EVERY DAY (Patient taking differently: Take 25 mg by mouth daily. ) 90 tablet 1  . vitamin B-12 (CYANOCOBALAMIN) 1000 MCG tablet Take 1,000 mcg by mouth daily.    . nitroGLYCERIN (NITROSTAT) 0.4 MG SL tablet Place 1 tablet (0.4 mg total) under the tongue every 5 (five) minutes as needed for chest pain. 100 tablet 3   No facility-administered medications prior to visit.    No Known Allergies  ROS Review of Systems  Constitutional: Negative for fatigue and fever.  Gastrointestinal: Positive for constipation. Negative for abdominal distention, abdominal pain, blood in stool, nausea and vomiting.  Hematological:       Anemia      Objective:    Physical Exam  Constitutional: He is oriented to person, place, and time. Vital signs are normal. He appears well-developed and well-nourished.   HENT:  Head: Normocephalic and atraumatic.  Cardiovascular: Normal rate and regular rhythm.  Pulmonary/Chest: Effort normal and breath sounds normal.  Abdominal: Soft. Bowel sounds are normal. There is no abdominal tenderness.  Neurological: He is oriented to person, place, and  time.  Psychiatric: He has a normal mood and affect. His behavior is normal.    BP (!) 173/63 (BP Location: Right Arm, Patient Position: Sitting, Cuff Size: Large)   Pulse 74   Temp 97.6 F (36.4 C) (Temporal)   Ht 5' 10.5" (1.791 m)   Wt 258 lb 6.4 oz (117.2 kg)   SpO2 90%   BMI 36.55 kg/m  Wt Readings from Last 3 Encounters:  09/06/19 258 lb 6.4 oz (117.2 kg)  09/04/19 259 lb 4.2 oz (117.6 kg)  08/31/19 250 lb (113.4 kg)    Health Maintenance Due  Topic Date Due  . URINE MICROALBUMIN  07/14/2019  . FOOT EXAM  07/15/2019    Lab Results  Component Value Date   TSH 10.740 (H) 09/02/2019   Lab Results  Component Value Date   WBC 10.1 09/03/2019   HGB 9.8 (L) 09/03/2019   HCT 32.0 (L) 09/03/2019   MCV 90.4 09/03/2019   PLT 257 09/03/2019   Lab Results  Component Value Date   NA 136 09/04/2019   K 4.2 09/04/2019   CO2 26 09/04/2019   GLUCOSE 138 (H) 09/04/2019   BUN 11 09/04/2019   CREATININE 1.25 (H) 09/04/2019   BILITOT 0.6 09/03/2019   ALKPHOS 55 09/03/2019   AST 21 09/03/2019   ALT 23 09/03/2019   PROT 6.1 (L) 09/03/2019   ALBUMIN 2.8 (L) 09/03/2019   CALCIUM 8.0 (L) 09/04/2019   ANIONGAP 9 09/04/2019   Lab Results  Component Value Date   CHOL 96 02/07/2019   Lab Results  Component Value Date   HDL 23 (L) 02/07/2019   Lab Results  Component Value Date   LDLCALC 45 02/07/2019   Lab Results  Component Value Date   TRIG 212 (H) 02/07/2019   Lab Results  Component Value Date   CHOLHDL 4.2 02/07/2019   Lab Results  Component Value Date   HGBA1C 7.7 (H) 08/29/2019      Assessment & Plan:  1. Constipation, unspecified constipation type Continue senokot and  miralax, increase water, avoid soda  2. Anemia, unspecified type GI referral  3. Hypothyroidism, unspecified type TSH elevated-increase dose to Synthroid 155mcg daily-recheck in 6 weeks level Follow-up: GI , keep appt for f/u in June  40 minutes reviewing hospital records, old records, GI note, colonoscopy report, labwork, physical exam, assessment, plan  Field Staniszewski Hannah Beat, MD

## 2019-09-07 ENCOUNTER — Encounter (INDEPENDENT_AMBULATORY_CARE_PROVIDER_SITE_OTHER): Payer: Self-pay | Admitting: Gastroenterology

## 2019-09-07 DIAGNOSIS — I5030 Unspecified diastolic (congestive) heart failure: Secondary | ICD-10-CM | POA: Diagnosis not present

## 2019-09-07 DIAGNOSIS — Z8673 Personal history of transient ischemic attack (TIA), and cerebral infarction without residual deficits: Secondary | ICD-10-CM | POA: Diagnosis not present

## 2019-09-07 DIAGNOSIS — G4733 Obstructive sleep apnea (adult) (pediatric): Secondary | ICD-10-CM | POA: Diagnosis not present

## 2019-09-07 DIAGNOSIS — J441 Chronic obstructive pulmonary disease with (acute) exacerbation: Secondary | ICD-10-CM | POA: Diagnosis not present

## 2019-09-08 ENCOUNTER — Other Ambulatory Visit: Payer: Self-pay | Admitting: Family Medicine

## 2019-09-09 DIAGNOSIS — I69353 Hemiplegia and hemiparesis following cerebral infarction affecting right non-dominant side: Secondary | ICD-10-CM | POA: Diagnosis not present

## 2019-09-09 DIAGNOSIS — M109 Gout, unspecified: Secondary | ICD-10-CM | POA: Diagnosis not present

## 2019-09-09 DIAGNOSIS — J9611 Chronic respiratory failure with hypoxia: Secondary | ICD-10-CM | POA: Diagnosis not present

## 2019-09-09 DIAGNOSIS — E039 Hypothyroidism, unspecified: Secondary | ICD-10-CM | POA: Diagnosis not present

## 2019-09-09 DIAGNOSIS — E119 Type 2 diabetes mellitus without complications: Secondary | ICD-10-CM | POA: Diagnosis not present

## 2019-09-09 DIAGNOSIS — K219 Gastro-esophageal reflux disease without esophagitis: Secondary | ICD-10-CM | POA: Diagnosis not present

## 2019-09-09 DIAGNOSIS — I10 Essential (primary) hypertension: Secondary | ICD-10-CM | POA: Diagnosis not present

## 2019-09-09 DIAGNOSIS — K567 Ileus, unspecified: Secondary | ICD-10-CM | POA: Diagnosis not present

## 2019-09-16 DIAGNOSIS — J9611 Chronic respiratory failure with hypoxia: Secondary | ICD-10-CM | POA: Diagnosis not present

## 2019-09-16 DIAGNOSIS — K567 Ileus, unspecified: Secondary | ICD-10-CM | POA: Diagnosis not present

## 2019-09-16 DIAGNOSIS — K219 Gastro-esophageal reflux disease without esophagitis: Secondary | ICD-10-CM | POA: Diagnosis not present

## 2019-09-16 DIAGNOSIS — E039 Hypothyroidism, unspecified: Secondary | ICD-10-CM | POA: Diagnosis not present

## 2019-09-16 DIAGNOSIS — E119 Type 2 diabetes mellitus without complications: Secondary | ICD-10-CM | POA: Diagnosis not present

## 2019-09-16 DIAGNOSIS — I69353 Hemiplegia and hemiparesis following cerebral infarction affecting right non-dominant side: Secondary | ICD-10-CM | POA: Diagnosis not present

## 2019-09-16 DIAGNOSIS — M109 Gout, unspecified: Secondary | ICD-10-CM | POA: Diagnosis not present

## 2019-09-16 DIAGNOSIS — I10 Essential (primary) hypertension: Secondary | ICD-10-CM | POA: Diagnosis not present

## 2019-09-23 DIAGNOSIS — K219 Gastro-esophageal reflux disease without esophagitis: Secondary | ICD-10-CM | POA: Diagnosis not present

## 2019-09-23 DIAGNOSIS — J9611 Chronic respiratory failure with hypoxia: Secondary | ICD-10-CM | POA: Diagnosis not present

## 2019-09-23 DIAGNOSIS — M109 Gout, unspecified: Secondary | ICD-10-CM | POA: Diagnosis not present

## 2019-09-23 DIAGNOSIS — K567 Ileus, unspecified: Secondary | ICD-10-CM | POA: Diagnosis not present

## 2019-09-23 DIAGNOSIS — E119 Type 2 diabetes mellitus without complications: Secondary | ICD-10-CM | POA: Diagnosis not present

## 2019-09-23 DIAGNOSIS — I69353 Hemiplegia and hemiparesis following cerebral infarction affecting right non-dominant side: Secondary | ICD-10-CM | POA: Diagnosis not present

## 2019-09-23 DIAGNOSIS — I10 Essential (primary) hypertension: Secondary | ICD-10-CM | POA: Diagnosis not present

## 2019-09-23 DIAGNOSIS — E039 Hypothyroidism, unspecified: Secondary | ICD-10-CM | POA: Diagnosis not present

## 2019-09-26 DIAGNOSIS — E119 Type 2 diabetes mellitus without complications: Secondary | ICD-10-CM | POA: Diagnosis not present

## 2019-09-26 DIAGNOSIS — J9611 Chronic respiratory failure with hypoxia: Secondary | ICD-10-CM | POA: Diagnosis not present

## 2019-09-26 DIAGNOSIS — I10 Essential (primary) hypertension: Secondary | ICD-10-CM | POA: Diagnosis not present

## 2019-09-26 DIAGNOSIS — I69353 Hemiplegia and hemiparesis following cerebral infarction affecting right non-dominant side: Secondary | ICD-10-CM | POA: Diagnosis not present

## 2019-09-26 DIAGNOSIS — K567 Ileus, unspecified: Secondary | ICD-10-CM | POA: Diagnosis not present

## 2019-09-26 DIAGNOSIS — E039 Hypothyroidism, unspecified: Secondary | ICD-10-CM | POA: Diagnosis not present

## 2019-09-26 DIAGNOSIS — K219 Gastro-esophageal reflux disease without esophagitis: Secondary | ICD-10-CM | POA: Diagnosis not present

## 2019-09-26 DIAGNOSIS — M109 Gout, unspecified: Secondary | ICD-10-CM | POA: Diagnosis not present

## 2019-09-30 ENCOUNTER — Other Ambulatory Visit: Payer: Self-pay | Admitting: "Endocrinology

## 2019-10-04 DIAGNOSIS — I10 Essential (primary) hypertension: Secondary | ICD-10-CM | POA: Diagnosis not present

## 2019-10-04 DIAGNOSIS — E119 Type 2 diabetes mellitus without complications: Secondary | ICD-10-CM | POA: Diagnosis not present

## 2019-10-04 DIAGNOSIS — K567 Ileus, unspecified: Secondary | ICD-10-CM | POA: Diagnosis not present

## 2019-10-04 DIAGNOSIS — J9611 Chronic respiratory failure with hypoxia: Secondary | ICD-10-CM | POA: Diagnosis not present

## 2019-10-04 DIAGNOSIS — M109 Gout, unspecified: Secondary | ICD-10-CM | POA: Diagnosis not present

## 2019-10-04 DIAGNOSIS — K219 Gastro-esophageal reflux disease without esophagitis: Secondary | ICD-10-CM | POA: Diagnosis not present

## 2019-10-04 DIAGNOSIS — I69353 Hemiplegia and hemiparesis following cerebral infarction affecting right non-dominant side: Secondary | ICD-10-CM | POA: Diagnosis not present

## 2019-10-04 DIAGNOSIS — E039 Hypothyroidism, unspecified: Secondary | ICD-10-CM | POA: Diagnosis not present

## 2019-10-07 DIAGNOSIS — I5030 Unspecified diastolic (congestive) heart failure: Secondary | ICD-10-CM | POA: Diagnosis not present

## 2019-10-07 DIAGNOSIS — Z8673 Personal history of transient ischemic attack (TIA), and cerebral infarction without residual deficits: Secondary | ICD-10-CM | POA: Diagnosis not present

## 2019-10-07 DIAGNOSIS — G4733 Obstructive sleep apnea (adult) (pediatric): Secondary | ICD-10-CM | POA: Diagnosis not present

## 2019-10-07 DIAGNOSIS — J441 Chronic obstructive pulmonary disease with (acute) exacerbation: Secondary | ICD-10-CM | POA: Diagnosis not present

## 2019-10-09 DIAGNOSIS — I10 Essential (primary) hypertension: Secondary | ICD-10-CM | POA: Diagnosis not present

## 2019-10-09 DIAGNOSIS — I69353 Hemiplegia and hemiparesis following cerebral infarction affecting right non-dominant side: Secondary | ICD-10-CM | POA: Diagnosis not present

## 2019-10-09 DIAGNOSIS — J9611 Chronic respiratory failure with hypoxia: Secondary | ICD-10-CM | POA: Diagnosis not present

## 2019-10-09 DIAGNOSIS — M109 Gout, unspecified: Secondary | ICD-10-CM | POA: Diagnosis not present

## 2019-10-09 DIAGNOSIS — E119 Type 2 diabetes mellitus without complications: Secondary | ICD-10-CM | POA: Diagnosis not present

## 2019-10-09 DIAGNOSIS — E039 Hypothyroidism, unspecified: Secondary | ICD-10-CM | POA: Diagnosis not present

## 2019-10-09 DIAGNOSIS — K567 Ileus, unspecified: Secondary | ICD-10-CM | POA: Diagnosis not present

## 2019-10-09 DIAGNOSIS — K219 Gastro-esophageal reflux disease without esophagitis: Secondary | ICD-10-CM | POA: Diagnosis not present

## 2019-10-10 DIAGNOSIS — J9611 Chronic respiratory failure with hypoxia: Secondary | ICD-10-CM | POA: Diagnosis not present

## 2019-10-10 DIAGNOSIS — E119 Type 2 diabetes mellitus without complications: Secondary | ICD-10-CM | POA: Diagnosis not present

## 2019-10-10 DIAGNOSIS — K567 Ileus, unspecified: Secondary | ICD-10-CM | POA: Diagnosis not present

## 2019-10-10 DIAGNOSIS — I69353 Hemiplegia and hemiparesis following cerebral infarction affecting right non-dominant side: Secondary | ICD-10-CM | POA: Diagnosis not present

## 2019-10-10 DIAGNOSIS — E039 Hypothyroidism, unspecified: Secondary | ICD-10-CM | POA: Diagnosis not present

## 2019-10-10 DIAGNOSIS — M109 Gout, unspecified: Secondary | ICD-10-CM | POA: Diagnosis not present

## 2019-10-10 DIAGNOSIS — I10 Essential (primary) hypertension: Secondary | ICD-10-CM | POA: Diagnosis not present

## 2019-10-10 DIAGNOSIS — K219 Gastro-esophageal reflux disease without esophagitis: Secondary | ICD-10-CM | POA: Diagnosis not present

## 2019-10-11 DIAGNOSIS — E782 Mixed hyperlipidemia: Secondary | ICD-10-CM | POA: Diagnosis not present

## 2019-10-11 DIAGNOSIS — E1165 Type 2 diabetes mellitus with hyperglycemia: Secondary | ICD-10-CM | POA: Diagnosis not present

## 2019-10-11 DIAGNOSIS — E559 Vitamin D deficiency, unspecified: Secondary | ICD-10-CM | POA: Diagnosis not present

## 2019-10-11 DIAGNOSIS — E038 Other specified hypothyroidism: Secondary | ICD-10-CM | POA: Diagnosis not present

## 2019-10-12 LAB — COMPLETE METABOLIC PANEL WITH GFR
AG Ratio: 1.3 (calc) (ref 1.0–2.5)
ALT: 6 U/L — ABNORMAL LOW (ref 9–46)
AST: 9 U/L — ABNORMAL LOW (ref 10–35)
Albumin: 3.7 g/dL (ref 3.6–5.1)
Alkaline phosphatase (APISO): 67 U/L (ref 35–144)
BUN/Creatinine Ratio: 19 (calc) (ref 6–22)
BUN: 27 mg/dL — ABNORMAL HIGH (ref 7–25)
CO2: 23 mmol/L (ref 20–32)
Calcium: 9.2 mg/dL (ref 8.6–10.3)
Chloride: 103 mmol/L (ref 98–110)
Creat: 1.39 mg/dL — ABNORMAL HIGH (ref 0.70–1.18)
GFR, Est African American: 56 mL/min/{1.73_m2} — ABNORMAL LOW (ref >=60)
GFR, Est Non African American: 49 mL/min/{1.73_m2} — ABNORMAL LOW (ref >=60)
Globulin: 2.8 g/dL (calc) (ref 1.9–3.7)
Glucose, Bld: 163 mg/dL — ABNORMAL HIGH (ref 65–99)
Potassium: 4.8 mmol/L (ref 3.5–5.3)
Sodium: 136 mmol/L (ref 135–146)
Total Bilirubin: 0.2 mg/dL (ref 0.2–1.2)
Total Protein: 6.5 g/dL (ref 6.1–8.1)

## 2019-10-12 LAB — LIPID PANEL
Cholesterol: 128 mg/dL (ref <200)
HDL: 24 mg/dL — ABNORMAL LOW (ref >=40)
LDL Cholesterol (Calc): 67 mg/dL (calc)
Non-HDL Cholesterol (Calc): 104 mg/dL (calc) (ref <130)
Total CHOL/HDL Ratio: 5.3 (calc) — ABNORMAL HIGH (ref <5.0)
Triglycerides: 283 mg/dL — ABNORMAL HIGH (ref <150)

## 2019-10-12 LAB — T4, FREE: Free T4: 1.1 ng/dL (ref 0.8–1.8)

## 2019-10-12 LAB — MICROALBUMIN / CREATININE URINE RATIO
Creatinine, Urine: 97 mg/dL (ref 20–320)
Microalb Creat Ratio: 66 mcg/mg creat — ABNORMAL HIGH (ref <30)
Microalb, Ur: 6.4 mg/dL

## 2019-10-12 LAB — TSH: TSH: 1.05 mIU/L (ref 0.40–4.50)

## 2019-10-12 LAB — VITAMIN D 25 HYDROXY (VIT D DEFICIENCY, FRACTURES): Vit D, 25-Hydroxy: 31 ng/mL (ref 30–100)

## 2019-10-14 DIAGNOSIS — I25118 Atherosclerotic heart disease of native coronary artery with other forms of angina pectoris: Secondary | ICD-10-CM | POA: Diagnosis not present

## 2019-10-14 DIAGNOSIS — E785 Hyperlipidemia, unspecified: Secondary | ICD-10-CM | POA: Diagnosis not present

## 2019-10-14 DIAGNOSIS — R0609 Other forms of dyspnea: Secondary | ICD-10-CM | POA: Diagnosis not present

## 2019-10-14 DIAGNOSIS — I639 Cerebral infarction, unspecified: Secondary | ICD-10-CM | POA: Diagnosis not present

## 2019-10-14 DIAGNOSIS — I5030 Unspecified diastolic (congestive) heart failure: Secondary | ICD-10-CM | POA: Diagnosis not present

## 2019-10-14 DIAGNOSIS — E119 Type 2 diabetes mellitus without complications: Secondary | ICD-10-CM | POA: Diagnosis not present

## 2019-10-14 DIAGNOSIS — M109 Gout, unspecified: Secondary | ICD-10-CM | POA: Diagnosis not present

## 2019-10-14 DIAGNOSIS — I10 Essential (primary) hypertension: Secondary | ICD-10-CM | POA: Diagnosis not present

## 2019-10-19 DIAGNOSIS — E039 Hypothyroidism, unspecified: Secondary | ICD-10-CM | POA: Diagnosis not present

## 2019-10-19 DIAGNOSIS — K219 Gastro-esophageal reflux disease without esophagitis: Secondary | ICD-10-CM | POA: Diagnosis not present

## 2019-10-19 DIAGNOSIS — E119 Type 2 diabetes mellitus without complications: Secondary | ICD-10-CM | POA: Diagnosis not present

## 2019-10-19 DIAGNOSIS — K567 Ileus, unspecified: Secondary | ICD-10-CM | POA: Diagnosis not present

## 2019-10-19 DIAGNOSIS — I10 Essential (primary) hypertension: Secondary | ICD-10-CM | POA: Diagnosis not present

## 2019-10-19 DIAGNOSIS — I69353 Hemiplegia and hemiparesis following cerebral infarction affecting right non-dominant side: Secondary | ICD-10-CM | POA: Diagnosis not present

## 2019-10-19 DIAGNOSIS — M109 Gout, unspecified: Secondary | ICD-10-CM | POA: Diagnosis not present

## 2019-10-19 DIAGNOSIS — J9611 Chronic respiratory failure with hypoxia: Secondary | ICD-10-CM | POA: Diagnosis not present

## 2019-10-20 ENCOUNTER — Ambulatory Visit (INDEPENDENT_AMBULATORY_CARE_PROVIDER_SITE_OTHER): Payer: Medicare HMO | Admitting: "Endocrinology

## 2019-10-20 ENCOUNTER — Encounter: Payer: Self-pay | Admitting: "Endocrinology

## 2019-10-20 ENCOUNTER — Other Ambulatory Visit: Payer: Self-pay

## 2019-10-20 VITALS — BP 138/60 | HR 76 | Ht 70.5 in | Wt 254.6 lb

## 2019-10-20 DIAGNOSIS — E038 Other specified hypothyroidism: Secondary | ICD-10-CM | POA: Diagnosis not present

## 2019-10-20 DIAGNOSIS — I10 Essential (primary) hypertension: Secondary | ICD-10-CM

## 2019-10-20 DIAGNOSIS — E782 Mixed hyperlipidemia: Secondary | ICD-10-CM | POA: Diagnosis not present

## 2019-10-20 DIAGNOSIS — E1165 Type 2 diabetes mellitus with hyperglycemia: Secondary | ICD-10-CM | POA: Diagnosis not present

## 2019-10-20 NOTE — Progress Notes (Signed)
10/20/2019  Endocrinology follow-up note  Subjective:    Patient ID: [name], male    DOB: [birth year], PCP Fayrene Helper, MD   Past Medical History:  Diagnosis Date  . Abnormality of gait 05/01/2015  . Anemia 04/19/2015  . Annual physical exam 12/11/2013  . Arthritis    "left leg; knees" (05/07/2017)  . Arthritis of knee   . CAD (coronary artery disease)   . Choking 07/13/2015  . Chronic bronchitis ([place])    "get it q yr"  . Chronic lower back pain    "since I was a teen" (05/07/2017)  . COPD (chronic obstructive pulmonary disease) ([place])   . CVA (cerebral vascular accident) ([place]) 05/2015   R sided weakness (05/07/2017)  . GERD (gastroesophageal reflux disease)   . Gout   . History of colonic polyps 04/03/2017   Added automatically from request for surgery [id number]  . Hyperlipidemia   . Hypertension   . Myocardial infarction ([place]) 1999  . Obesity   . OSA on CPAP   . Pneumonia    "a few times; last time was ~ 06/2013" (05/07/2017)  . Psoriasis   . Tussive syncope 05/01/2015  . Type II diabetes mellitus ([place])   . Vitamin B12 deficiency 05/01/2015   Past Surgical History:  Procedure Laterality Date  . APPENDECTOMY    . CATARACT EXTRACTION W/PHACO Left 10/07/2016   Procedure: CATARACT EXTRACTION PHACO AND INTRAOCULAR LENS PLACEMENT (IOC);  Surgeon: Rutherford Guys, MD;  Location: [facility];  Service: Ophthalmology;  Laterality: Left;  CDE: 13.49  . CATARACT EXTRACTION W/PHACO Right 10/28/2016   Procedure: CATARACT EXTRACTION PHACO AND INTRAOCULAR LENS PLACEMENT (IOC);  Surgeon: Rutherford Guys, MD;  Location: [facility];  Service: Ophthalmology;  Laterality: Right;  CDE: 16.71  . COLONOSCOPY  03/17/2012   Procedure: COLONOSCOPY;  Surgeon: Rogene Houston, MD;  Location: [facility];  Service: Endoscopy;  Laterality: N/A;  830  . COLONOSCOPY N/A 06/25/2017   Procedure: COLONOSCOPY;  Surgeon: Rogene Houston, MD;  Location: [facility];  Service: Endoscopy;  Laterality: N/A;   [place]- 2009-08/30/2013   "counting today's, I have 5 stents" (08/30/2013)  . CORONARY ANGIOPLASTY WITH STENT PLACEMENT  05/07/2017  . CORONARY STENT INTERVENTION N/A 05/07/2017   Procedure: CORONARY STENT INTERVENTION;  Surgeon: Charolette Forward, MD;  Location: [facility];  Service: Cardiovascular;  Laterality: N/A;  . CYSTOSCOPY WITH URETHRAL DILATATION N/A 11/26/2012   Procedure: CYSTOSCOPY WITH URETHRAL DILATATION;  Surgeon: Malka So, MD;  Location: [facility];  Service: Urology;  Laterality: N/A;  . LEFT HEART CATH AND CORONARY ANGIOGRAPHY N/A 05/07/2017   Procedure: LEFT HEART CATH AND CORONARY ANGIOGRAPHY;  Surgeon: Charolette Forward, MD;  Location: [facility];  Service: Cardiovascular;  Laterality: N/A;  . LEFT HEART CATHETERIZATION WITH CORONARY ANGIOGRAM N/A 08/30/2013   Procedure: LEFT HEART CATHETERIZATION WITH CORONARY ANGIOGRAM;  Surgeon: Clent Demark, MD;  Location: [facility];  Service: Cardiovascular;  Laterality: N/A;  . PERCUTANEOUS CORONARY STENT INTERVENTION (PCI-S) Right 08/30/2013   Procedure: PERCUTANEOUS CORONARY STENT INTERVENTION (PCI-S);  Surgeon: Clent Demark, MD;  Location: [facility];  Service: Cardiovascular;  Laterality: Right;   Social History   Socioeconomic History  . Marital status: Married    Spouse name: Not on file  . Number of children: 2  . Years of education: 91  . Highest education level: 12th grade  Occupational History  . Occupation:  janitorial services     Comment: retired   Tobacco Use  . Smoking status: Former Smoker    Packs/day: 0.50    Years: 33.00    Pack years: 16.50    Types: Cigarettes    Quit date: 11/29/2002    Years since quitting: 16.9  . Smokeless tobacco: Never Used  Substance and Sexual Activity  . Alcohol use: No  . Drug use: No  . Sexual activity: Not Currently  Other Topics Concern  . Not on file  Social History Narrative   Patient drinks about 3  cups of soda daily.    Patient is left handed.    Lives alone with wife    Social Determinants of Health   Financial Resource Strain:   . Difficulty of Paying Living Expenses:   Food Insecurity: Unknown  . Worried About Charity fundraiser in the Last Year: Not on file  . Ran Out of Food in the Last Year: Never true  Transportation Needs: No Transportation Needs  . Lack of Transportation (Medical): No  . Lack of Transportation (Non-Medical): No  Physical Activity: Inactive  . Days of Exercise per Week: 0 days  . Minutes of Exercise per Session: 0 min  Stress: No Stress Concern Present  . Feeling of Stress : Not at all  Social Connections: Somewhat Isolated  . Frequency of Communication with Friends and Family: Twice a week  . Frequency of Social Gatherings with Friends and Family: Once a week  . Attends Religious Services: Never  . Active Member of Clubs or Organizations: No  . Attends Archivist Meetings: Never  . Marital Status: Married   Outpatient Encounter Medications as of 10/20/2019  Medication Sig  . acetaminophen (TYLENOL) 500 MG tablet Take 1,000 mg by mouth every 6 (six) hours as needed for moderate pain (left thigh).   Marland Kitchen albuterol (VENTOLIN HFA) 108 (90 Base) MCG/ACT inhaler Inhale 1-2 puffs into the lungs every 6 (six) hours as needed for wheezing or shortness of breath.  . allopurinol (ZYLOPRIM) 300 MG tablet TAKE 1 TABLET EVERY DAY  . aspirin EC 81 MG tablet Take 81 mg by mouth daily.  . budesonide-formoterol (SYMBICORT) 80-4.5 MCG/ACT inhaler Inhale 2 puffs into the lungs daily.  . clopidogrel (PLAVIX) 75 MG tablet TAKE 1 TABLET (75 MG TOTAL) BY MOUTH DAILY.  . isosorbide mononitrate (IMDUR) 30 MG 24 hr tablet Take 30 mg by mouth daily.  Marland Kitchen levETIRAcetam (KEPPRA) 500 MG tablet TAKE 1 TABLET TWICE DAILY (Patient taking differently: Take 500 mg by mouth 2 (two) times daily. )  . levothyroxine (SYNTHROID) 112 MCG tablet Take 1 tablet (112 mcg total) by mouth  daily.  . metoprolol tartrate (LOPRESSOR) 50 MG tablet TAKE 1/2 TABLET TWICE DAILY (Patient taking differently: Take 25 mg by mouth 2 (two) times daily. )  . nitroGLYCERIN (NITROSTAT) 0.4 MG SL tablet Place 1 tablet (0.4 mg total) under the tongue every 5 (five) minutes as needed for chest pain.  Marland Kitchen NOVOLIN 70/30 (70-30) 100 UNIT/ML injection INJECT 45 UNITS SUBCUTANEOUSLY WITH BREAKFAST AND INJECT 35 UNITS WITH SUPPER (WHEN GLUCOSE IS ABOVE 90)  . pantoprazole (PROTONIX) 40 MG tablet TAKE 1 TABLET EVERY DAY BEFORE BREAKFAST (Patient taking differently: Take 40 mg by mouth daily. )  . polyethylene glycol (MIRALAX / GLYCOLAX) 17 g packet Take 17 g by mouth daily as needed.  . pravastatin (PRAVACHOL) 40 MG tablet TAKE 2 TABLETS EVERY DAY (Patient taking differently: Take 80 mg by mouth daily. )  .  spironolactone (ALDACTONE) 25 MG tablet TAKE 1 TABLET EVERY DAY (Patient taking differently: Take 25 mg by mouth daily. )  . vitamin B-12 (CYANOCOBALAMIN) 1000 MCG tablet Take 1,000 mcg by mouth daily.  . [DISCONTINUED] metFORMIN (GLUCOPHAGE) 500 MG tablet TAKE 1 TABLET EVERY DAY WITH BREAKFAST (Patient taking differently: Take 500 mg by mouth daily with breakfast. )   No facility-administered encounter medications on file as of 10/20/2019.   ALLERGIES: No Known Allergies VACCINATION STATUS: Immunization History  Administered Date(s) Administered  . Fluad Quad(high Dose 65+) 01/31/2019  . Influenza Whole 03/18/2005  . Influenza, High Dose Seasonal PF 02/22/2018  . Influenza,inj,Quad PF,6+ Mos 03/19/2017  . PFIZER SARS-COV-2 Vaccination 06/25/2019, 07/16/2019  . Pneumococcal Conjugate-13 12/08/2013  . Pneumococcal Polysaccharide-23 06/09/2012  . Td 12/04/2003  . Tdap 03/06/2017    Diabetes He presents for his follow-up diabetic visit. He has type 2 diabetes mellitus. Onset time: He was diagnosed at approximate age of 34 years. His disease course has been improving. There are no hypoglycemic  associated symptoms. Pertinent negatives for hypoglycemia include no confusion, headaches, pallor or seizures. Pertinent negatives for diabetes include no chest pain, no fatigue, no polydipsia, no polyphagia, no polyuria and no weakness. Symptoms are improving. Diabetic complications include a CVA, heart disease, nephropathy and peripheral neuropathy. Risk factors for coronary artery disease include dyslipidemia, diabetes mellitus, male sex, obesity, tobacco exposure and sedentary lifestyle. Current diabetic treatment includes insulin injections. He is compliant with treatment most of the time. His weight is fluctuating minimally. He is following a generally unhealthy diet. When asked about meal planning, he reported none. His home blood glucose trend is increasing steadily. His breakfast blood glucose range is generally 140-180 mg/dl. His dinner blood glucose range is generally 140-180 mg/dl. His overall blood glucose range is 140-180 mg/dl. (He returns with controlled glycemic profile both fasting and postprandial.  His point-of-care A1c 7.7%, improving from 8.5%.  No hypoglycemia reported nor documented. ) An ACE inhibitor/angiotensin II receptor blocker is being taken. Eye exam is current.  Hyperlipidemia This is a chronic problem. The current episode started more than 1 year ago. The problem is controlled. Exacerbating diseases include diabetes, hypothyroidism and obesity. Associated symptoms include shortness of breath. Pertinent negatives include no chest pain or myalgias. Current antihyperlipidemic treatment includes statins. Risk factors for coronary artery disease include diabetes mellitus, dyslipidemia, hypertension, male sex and a sedentary lifestyle.  Hypertension This is a chronic problem. The current episode started more than 1 year ago. The problem is controlled. Associated symptoms include shortness of breath. Pertinent negatives include no chest pain, headaches, neck pain or palpitations. Risk  factors for coronary artery disease include dyslipidemia, diabetes mellitus, sedentary lifestyle and male gender. Past treatments include ACE inhibitors. Hypertensive end-organ damage includes CAD/MI and CVA. Identifiable causes of hypertension include a thyroid problem.  Thyroid Problem Presents for follow-up visit. Patient reports no constipation, diarrhea, fatigue or palpitations. The symptoms have been stable. Past treatments include levothyroxine. His past medical history is significant for diabetes and hyperlipidemia.    Review of systems  Constitutional: + Minimally fluctuating body weight,  current  Body mass index is 36.01 kg/m. , no fatigue, no subjective hyperthermia, no subjective hypothermia Eyes: no blurry vision, no xerophthalmia ENT: no sore throat, no nodules palpated in throat, no dysphagia/odynophagia, no hoarseness Cardiovascular: no Chest Pain, no Shortness of Breath, no palpitations, no leg swelling Respiratory: no cough, no shortness of breath Gastrointestinal: no Nausea/Vomiting/Diarhhea Musculoskeletal: + Disequilibrium, ambulates with a specialized walker.  Skin: no rashes Neurological: no tremors, no numbness, no tingling, no dizziness Psychiatric: no depression, no anxiety   Objective:    BP 138/60   Pulse 76   Ht 5' 10.5" (1.791 m)   Wt 254 lb 9.6 oz (115.5 kg)   BMI 36.01 kg/m   Wt Readings from Last 3 Encounters:  10/20/19 254 lb 9.6 oz (115.5 kg)  09/06/19 258 lb 6.4 oz (117.2 kg)  09/04/19 259 lb 4.2 oz (117.6 kg)     Physical Exam- Limited  Constitutional:  Body mass index is 36.01 kg/m. , not in acute distress, normal state of mind Eyes:  EOMI, no exophthalmos Neck: Supple Respiratory: Adequate breathing efforts Musculoskeletal:  + Walks with no gross dwalks withdue to disequilibrium related to his previous CVA.   Skin:  no rashes, no hyperemia Neurological: no tremor with outstretched hands.  CMP     Component Value Date/Time   NA  136 10/11/2019 0924   K 4.8 10/11/2019 0924   CL 103 10/11/2019 0924   CO2 23 10/11/2019 0924   GLUCOSE 163 (H) 10/11/2019 0924   BUN 27 (H) 10/11/2019 0924   CREATININE 1.39 (H) 10/11/2019 0924   CALCIUM 9.2 10/11/2019 0924   PROT 6.5 10/11/2019 0924   ALBUMIN 2.8 (L) 09/03/2019 0449   AST 9 (L) 10/11/2019 0924   ALT 6 (L) 10/11/2019 0924   ALKPHOS 55 09/03/2019 0449   BILITOT 0.2 10/11/2019 0924   GFRNONAA 49 (L) 10/11/2019 0924   GFRAA 56 (L) 10/11/2019 0924     Diabetic Labs (most recent): Lab Results  Component Value Date   HGBA1C 7.7 (H) 08/29/2019   HGBA1C 7.9 (A) 06/20/2019   HGBA1C 8.5 (H) 02/07/2019     Lipid Panel ( most recent) Lipid Panel     Component Value Date/Time   CHOL 128 10/11/2019 0924   TRIG 283 (H) 10/11/2019 0924   HDL 24 (L) 10/11/2019 0924   CHOLHDL 5.3 (H) 10/11/2019 0924   VLDL 35 04/20/2017 1130   LDLCALC 67 10/11/2019 0924   LDLDIRECT 50 08/18/2014 0737      Assessment & Plan:   1. Type 2 diabetes mellitus with multiple competitions including diabetic nephropathy -His diabetes is  complicated by coronary artery disease, CVA, CKD and patient remains at a high risk for more acute and chronic complications of diabetes which include CAD, CVA, CKD, retinopathy, and neuropathy. These are all discussed in detail with the patient.  He returns with controlled glycemic profile both fasting and postprandial.  His point-of-care A1c 7.7%, improving from 8.5%.  No hypoglycemia reported nor documented.    -  Recent labs reviewed.  - I have re-counseled the patient on diet management and weight loss  by adopting a carbohydrate restricted / protein rich  Diet.  - he  admits there is a room for improvement in his diet and drink choices. -  Suggestion is made for him to avoid simple carbohydrates  from his diet including Cakes, Sweet Desserts / Pastries, Ice Cream, Soda (diet and regular), Sweet Tea, Candies, Chips, Cookies, Sweet Pastries,  Store  Bought Juices, Alcohol in Excess of  1-2 drinks a day, Artificial Sweeteners, Coffee Creamer, and "Sugar-free" Products. This will help patient to have stable blood glucose profile and potentially avoid unintended weight gain.   - Patient is advised to stick to a routine mealtimes to eat 3 meals  a day and avoid unnecessary snacks ( to snack only to correct hypoglycemia).  - I have  approached patient with the following individualized plan to manage diabetes and patient agrees.  - He has done reasonably very well on premixed Novolin 70/30. He wishes to stay on the same regimen for cost reasons.  -He is advised to continue on Novolin 70/30 45 units at breakfast and 35 units with supper  for pre-meal glucose of greater than 90 mg/dL. He is advised to continue strict  monitoring of glucose  4 times a day-before meals and at bedtime.   -He is warned  not to take insulin without proper monitoring per orders.  -Patient is encouraged to call clinic for blood glucose levels less than 70 or above 300 mg /dl. -Due to acute renal insufficiency on his most recent labs, he is advised to discontinue Metformin for now.       - Patient specific target  for A1c; LDL, HDL, Triglycerides, and  Waist Circumference were discussed in detail.  2) BP/HTN: His blood pressure is controlled to target.    He is advised to continue his current blood pressure medications including losartan 50 mg p.o. daily.    3) Lipids/HPL: His recent lipid panel showed controlled LDL at 67.  He is advised to continue pravastatin 40 mg p.o. nightly.   4)  Weight/Diet: His BMI 36.  He is a candidate for modest weight loss.  He cannot exercise optimally, limited by pulmonary sufficiency due to COPD.   CDE consult in progress, exercise, and carbohydrates information provided.  5) hypothyroidism:  -His recent thyroid function tests are consistent with appropriate replacement.  He is advised to continue with levothyroxine 100 mcg p.o.  daily before breakfast.    - We discussed about the correct intake of his thyroid hormone, on empty stomach at fasting, with water, separated by at least 30 minutes from breakfast and other medications,  and separated by more than 4 hours from calcium, iron, multivitamins, acid reflux medications (PPIs). -Patient is made aware of the fact that thyroid hormone replacement is needed for life, dose to be adjusted by periodic monitoring of thyroid function tests.  6) Chronic Care/Health Maintenance:  -Patient is on ACEI/ARB and Statin medications and encouraged to continue to follow up with Ophthalmology, nephrology,  Podiatrist at least yearly or according to recommendations, and advised to  stay away from smoking. I have recommended yearly flu vaccine and pneumonia vaccination at least every 5 years;  and  sleep for at least 7 hours a day.  - I advised him to pick up compression stockings to wear on bilateral lower extremities during the times, to help with dependent edema on bilateral lower extremities.   - I advised patient to maintain close follow up with Fayrene Helper, MD for primary care needs.  - Time spent on this patient care encounter:  35 min, of which > 50% was spent in  counseling and the rest reviewing his blood glucose logs , discussing his hypoglycemia and hyperglycemia episodes, reviewing his current and  previous labs / studies  ( including abstraction from other facilities) and medications  doses and developing a  long term treatment plan and documenting his care.   Please refer to Patient Instructions for Blood Glucose Monitoring and Insulin/Medications Dosing Guide"  in media tab for additional information. Please  also refer to " Patient Self Inventory" in the Media  tab for reviewed elements of pertinent patient history.  Harlow Mares participated in the discussions, expressed understanding, and voiced agreement with the above plans.  All questions were  answered to his  satisfaction. he is encouraged to contact clinic should he have any questions or concerns prior to his return visit.   Follow up plan: -Return in about 4 months (around 02/19/2020) for F/U with Pre-visit Labs, Meter, Logs, A1c here.Glade Lloyd, MD Phone: [phone]  Fax: [phone]   -  This note was partially dictated with voice recognition software. Similar sounding words can be transcribed inadequately or may not  be corrected upon review.  10/20/2019, 4:22 PM

## 2019-10-20 NOTE — Patient Instructions (Signed)

## 2019-10-24 DIAGNOSIS — E039 Hypothyroidism, unspecified: Secondary | ICD-10-CM | POA: Diagnosis not present

## 2019-10-24 DIAGNOSIS — I69353 Hemiplegia and hemiparesis following cerebral infarction affecting right non-dominant side: Secondary | ICD-10-CM | POA: Diagnosis not present

## 2019-10-24 DIAGNOSIS — M109 Gout, unspecified: Secondary | ICD-10-CM | POA: Diagnosis not present

## 2019-10-24 DIAGNOSIS — E119 Type 2 diabetes mellitus without complications: Secondary | ICD-10-CM | POA: Diagnosis not present

## 2019-10-24 DIAGNOSIS — K567 Ileus, unspecified: Secondary | ICD-10-CM | POA: Diagnosis not present

## 2019-10-24 DIAGNOSIS — K219 Gastro-esophageal reflux disease without esophagitis: Secondary | ICD-10-CM | POA: Diagnosis not present

## 2019-10-24 DIAGNOSIS — J9611 Chronic respiratory failure with hypoxia: Secondary | ICD-10-CM | POA: Diagnosis not present

## 2019-10-24 DIAGNOSIS — I10 Essential (primary) hypertension: Secondary | ICD-10-CM | POA: Diagnosis not present

## 2019-10-31 ENCOUNTER — Other Ambulatory Visit: Payer: Self-pay | Admitting: "Endocrinology

## 2019-11-02 DIAGNOSIS — K219 Gastro-esophageal reflux disease without esophagitis: Secondary | ICD-10-CM | POA: Diagnosis not present

## 2019-11-02 DIAGNOSIS — I10 Essential (primary) hypertension: Secondary | ICD-10-CM | POA: Diagnosis not present

## 2019-11-02 DIAGNOSIS — M109 Gout, unspecified: Secondary | ICD-10-CM | POA: Diagnosis not present

## 2019-11-02 DIAGNOSIS — J9611 Chronic respiratory failure with hypoxia: Secondary | ICD-10-CM | POA: Diagnosis not present

## 2019-11-02 DIAGNOSIS — I69353 Hemiplegia and hemiparesis following cerebral infarction affecting right non-dominant side: Secondary | ICD-10-CM | POA: Diagnosis not present

## 2019-11-02 DIAGNOSIS — K567 Ileus, unspecified: Secondary | ICD-10-CM | POA: Diagnosis not present

## 2019-11-02 DIAGNOSIS — E119 Type 2 diabetes mellitus without complications: Secondary | ICD-10-CM | POA: Diagnosis not present

## 2019-11-02 DIAGNOSIS — E039 Hypothyroidism, unspecified: Secondary | ICD-10-CM | POA: Diagnosis not present

## 2019-11-07 DIAGNOSIS — Z8673 Personal history of transient ischemic attack (TIA), and cerebral infarction without residual deficits: Secondary | ICD-10-CM | POA: Diagnosis not present

## 2019-11-07 DIAGNOSIS — J441 Chronic obstructive pulmonary disease with (acute) exacerbation: Secondary | ICD-10-CM | POA: Diagnosis not present

## 2019-11-07 DIAGNOSIS — I5030 Unspecified diastolic (congestive) heart failure: Secondary | ICD-10-CM | POA: Diagnosis not present

## 2019-11-07 DIAGNOSIS — G4733 Obstructive sleep apnea (adult) (pediatric): Secondary | ICD-10-CM | POA: Diagnosis not present

## 2019-11-08 ENCOUNTER — Ambulatory Visit: Payer: Medicare HMO | Admitting: Family Medicine

## 2019-11-10 ENCOUNTER — Telehealth (INDEPENDENT_AMBULATORY_CARE_PROVIDER_SITE_OTHER): Payer: Medicare HMO | Admitting: Family Medicine

## 2019-11-10 ENCOUNTER — Other Ambulatory Visit: Payer: Self-pay

## 2019-11-10 ENCOUNTER — Encounter: Payer: Self-pay | Admitting: Family Medicine

## 2019-11-10 VITALS — BP 138/60 | Ht 70.5 in | Wt 254.0 lb

## 2019-11-10 DIAGNOSIS — E1165 Type 2 diabetes mellitus with hyperglycemia: Secondary | ICD-10-CM

## 2019-11-10 DIAGNOSIS — I10 Essential (primary) hypertension: Secondary | ICD-10-CM

## 2019-11-10 DIAGNOSIS — J449 Chronic obstructive pulmonary disease, unspecified: Secondary | ICD-10-CM

## 2019-11-10 NOTE — Assessment & Plan Note (Signed)
He is encouraged to maintain a DASH diet and physical exercise as tolerated.  And continue all medications as directed.

## 2019-11-10 NOTE — Progress Notes (Signed)
Virtual Visit via Telephone Note   This visit type was conducted due to national recommendations for restrictions regarding the COVID-19 Pandemic (e.g. social distancing) in an effort to limit this patient's exposure and mitigate transmission in our community.  Due to his co-morbid illnesses, this patient is at least at moderate risk for complications without adequate follow up.  This format is felt to be most appropriate for this patient at this time.  The patient did not have access to video technology/had technical difficulties with video requiring transitioning to audio format only (telephone).  All issues noted in this document were discussed and addressed.  No physical exam could be performed with this format.    Evaluation Performed:  Follow-up visit  Date:  11/10/2019   ID:  [name], [name] 1940/06/01, MRN [id number]  Patient Location: Home Provider Location: Office  Location of Patient: Home Location of Provider: Telehealth Consent was obtain for visit to be over via telehealth. I verified that I am speaking with the correct person using two identifiers.  PCP:  Fayrene Helper, MD   Chief Complaint: chronic conditions  History of Present Illness:    [name] is a 79 y.o. male with history of arthritis, gout, CAD, hypertension, hyperlipidemia, type 2 diabetes among others.  Presents today for phone follow-up for chronic conditions.  He reports he has no complaints today is taking his medications as directed.  Overall feeling well.  Not having any sleep trouble.  Denies having any memory changes.  Denies having any trouble chewing swallowing or eating.  Denies having any bladder or bowel changes.  Denies having any chest pain, headaches, dizziness, vision changes or shortness of breath.  The patient does not have symptoms concerning for COVID-19 infection (fever, chills, cough, or new shortness of breath).   Past Medical, Surgical, Social History, Allergies,  and Medications have been Reviewed.  Past Medical History:  Diagnosis Date  . Abnormality of gait 05/01/2015  . Anemia 04/19/2015  . Annual physical exam 12/11/2013  . Arthritis    "left leg; knees" (05/07/2017)  . Arthritis of knee   . CAD (coronary artery disease)   . Choking 07/13/2015  . Chronic bronchitis ([place])    "get it q yr"  . Chronic lower back pain    "since I was a teen" (05/07/2017)  . COPD (chronic obstructive pulmonary disease) ([place])   . CVA (cerebral vascular accident) ([place]) 05/2015   R sided weakness (05/07/2017)  . GERD (gastroesophageal reflux disease)   . Gout   . History of colonic polyps 04/03/2017   Added automatically from request for surgery [id number]  . Hyperlipidemia   . Hypertension   . Myocardial infarction ([place]) 1999  . Obesity   . OSA on CPAP   . Pneumonia    "a few times; last time was ~ 06/2013" (05/07/2017)  . Psoriasis   . Tussive syncope 05/01/2015  . Type II diabetes mellitus ([place])   . Vitamin B12 deficiency 05/01/2015   Past Surgical History:  Procedure Laterality Date  . APPENDECTOMY    . CATARACT EXTRACTION W/PHACO Left 10/07/2016   Procedure: CATARACT EXTRACTION PHACO AND INTRAOCULAR LENS PLACEMENT (IOC);  Surgeon: Rutherford Guys, MD;  Location: [facility];  Service: Ophthalmology;  Laterality: Left;  CDE: 13.49  . CATARACT EXTRACTION W/PHACO Right 10/28/2016   Procedure: CATARACT EXTRACTION PHACO AND INTRAOCULAR LENS PLACEMENT (IOC);  Surgeon: Rutherford Guys, MD;  Location: [facility];  Service: Ophthalmology;  Laterality: Right;  CDE:  16.71  . COLONOSCOPY  03/17/2012   Procedure: COLONOSCOPY;  Surgeon: Rogene Houston, MD;  Location: [facility];  Service: Endoscopy;  Laterality: N/A;  830  . COLONOSCOPY N/A 06/25/2017   Procedure: COLONOSCOPY;  Surgeon: Rogene Houston, MD;  [facility];  Service: Endoscopy;  Laterality: N/A;  [place]- 2009-08/30/2013   "counting today's, I have 5  stents" (08/30/2013)  . CORONARY ANGIOPLASTY WITH STENT PLACEMENT  05/07/2017  . CORONARY STENT INTERVENTION N/A 05/07/2017   Procedure: CORONARY STENT INTERVENTION;  Surgeon: Charolette Forward, MD;  Location: [facility];  Service: Cardiovascular;  Laterality: N/A;  . CYSTOSCOPY WITH URETHRAL DILATATION N/A 11/26/2012   Procedure: CYSTOSCOPY WITH URETHRAL DILATATION;  Surgeon: Malka So, MD;  Location: [facility];  Service: Urology;  Laterality: N/A;  . LEFT HEART CATH AND CORONARY ANGIOGRAPHY N/A 05/07/2017   Procedure: LEFT HEART CATH AND CORONARY ANGIOGRAPHY;  Surgeon: Charolette Forward, MD;  Location: [facility];  Service: Cardiovascular;  Laterality: N/A;  . LEFT HEART CATHETERIZATION WITH CORONARY ANGIOGRAM N/A 08/30/2013   Procedure: LEFT HEART CATHETERIZATION WITH CORONARY ANGIOGRAM;  Surgeon: Clent Demark, MD;  Location: [facility];  Service: Cardiovascular;  Laterality: N/A;  . PERCUTANEOUS CORONARY STENT INTERVENTION (PCI-S) Right 08/30/2013   Procedure: PERCUTANEOUS CORONARY STENT INTERVENTION (PCI-S);  Surgeon: Clent Demark, MD;  Location: [facility];  Service: Cardiovascular;  Laterality: Right;     Current Meds  Medication Sig  . acetaminophen (TYLENOL) 500 MG tablet Take 1,000 mg by mouth every 6 (six) hours as needed for moderate pain (left thigh).   Marland Kitchen albuterol (VENTOLIN HFA) 108 (90 Base) MCG/ACT inhaler Inhale 1-2 puffs into the lungs every 6 (six) hours as needed for wheezing or shortness of breath.  . allopurinol (ZYLOPRIM) 300 MG tablet TAKE 1 TABLET EVERY DAY  . aspirin EC 81 MG tablet Take 81 mg by mouth daily.  . budesonide-formoterol (SYMBICORT) 80-4.5 MCG/ACT inhaler Inhale 2 puffs into the lungs daily.  . clopidogrel (PLAVIX) 75 MG tablet TAKE 1 TABLET (75 MG TOTAL) BY MOUTH DAILY.  . isosorbide mononitrate (IMDUR) 30 MG 24 hr tablet Take 30 mg by mouth daily.  Marland Kitchen levETIRAcetam (KEPPRA) 500 MG tablet TAKE 1 TABLET TWICE DAILY (Patient taking  differently: Take 500 mg by mouth 2 (two) times daily. )  . levothyroxine (SYNTHROID) 112 MCG tablet Take 1 tablet (112 mcg total) by mouth daily.  . metoprolol tartrate (LOPRESSOR) 50 MG tablet TAKE 1/2 TABLET TWICE DAILY (Patient taking differently: Take 25 mg by mouth 2 (two) times daily. )  . NOVOLIN 70/30 (70-30) 100 UNIT/ML injection INJECT 45 UNITS SUBCUTANEOUSLY WITH BREAKFAST AND INJECT 35 UNITS WITH SUPPER (WHEN GLUCOSE IS ABOVE 90)  . pantoprazole (PROTONIX) 40 MG tablet TAKE 1 TABLET EVERY DAY BEFORE BREAKFAST (Patient taking differently: Take 40 mg by mouth daily. )  . polyethylene glycol (MIRALAX / GLYCOLAX) 17 g packet Take 17 g by mouth daily as needed.  . pravastatin (PRAVACHOL) 40 MG tablet TAKE 2 TABLETS EVERY DAY (Patient taking differently: Take 80 mg by mouth daily. )  . spironolactone (ALDACTONE) 25 MG tablet TAKE 1 TABLET EVERY DAY (Patient taking differently: Take 25 mg by mouth daily. )  . vitamin B-12 (CYANOCOBALAMIN) 1000 MCG tablet Take 1,000 mcg by mouth daily.     Allergies:   Patient has no known allergies.   ROS:   Please see the history of  present illness.    All other systems reviewed and are negative.   Labs/Other Tests and Data Reviewed:    Recent Labs: 09/02/2019: Magnesium 1.6 09/03/2019: Hemoglobin 9.8; Platelets 257 10/11/2019: ALT 6; BUN 27; Creat 1.39; Potassium 4.8; Sodium 136; TSH 1.05   Recent Lipid Panel Lab Results  Component Value Date/Time   CHOL 128 10/11/2019 09:24 AM   TRIG 283 (H) 10/11/2019 09:24 AM   HDL 24 (L) 10/11/2019 09:24 AM   CHOLHDL 5.3 (H) 10/11/2019 09:24 AM   LDLCALC 67 10/11/2019 09:24 AM   LDLDIRECT 50 08/18/2014 07:37 AM    Wt Readings from Last 3 Encounters:  11/10/19 254 lb (115.2 kg)  10/20/19 254 lb 9.6 oz (115.5 kg)  09/06/19 258 lb 6.4 oz (117.2 kg)     Objective:    Vital Signs:  BP 138/60   Ht 5' 10.5" (1.791 m)   Wt 254 lb (115.2 kg)   BMI 35.93 kg/m    VITAL SIGNS:  reviewed GEN:  Alert and  oriented RESPIRATORY:  No shortness of breath noted in conversation PSYCH:  Normal affect and mood  ASSESSMENT & PLAN:     1. Essential hypertension  2. Chronic obstructive pulmonary disease, unspecified COPD type ([place])   3. Uncontrolled type 2 diabetes mellitus with hyperglycemia ([place])   Time:   Today, I have spent 10 minutes with the patient with telehealth technology discussing the above problems.     Medication Adjustments/Labs and Tests Ordered: Current medicines are reviewed at length with the patient today.  Concerns regarding medicines are outlined above.   Tests Ordered: No orders of the defined types were placed in this encounter.   Medication Changes: No orders of the defined types were placed in this encounter.   Disposition:  Follow up 3-4 months Signed, Perlie Mayo, NP  11/10/2019 10:32 AM     [place]

## 2019-11-10 NOTE — Assessment & Plan Note (Signed)
Is on a statin.  Will need updated labs at next appointment.  Advised to continue taking all medications as directed.

## 2019-11-10 NOTE — Assessment & Plan Note (Signed)
CPAP at night.  O2 via nasal cannula during the day.  Overall doing well not having any shortness of breath.

## 2019-11-10 NOTE — Patient Instructions (Signed)
I appreciate the opportunity to provide you with care for your health and wellness. Today we discussed: Overall health  Follow up: 3 to 4 months in office with Dr. Moshe Cipro  No labs or referrals today  Please continue to practice social distancing to keep you, your family, and our community safe.  If you must go out, please wear a mask and practice good handwashing.  It was a pleasure to see you and I look forward to continuing to work together on your health and well-being. Please do not hesitate to call the office if you need care or have questions about your care.  Have a wonderful day and week. With Gratitude, Cherly Beach, DNP, AGNP-BC

## 2019-11-14 DIAGNOSIS — K219 Gastro-esophageal reflux disease without esophagitis: Secondary | ICD-10-CM | POA: Diagnosis not present

## 2019-11-14 DIAGNOSIS — J9611 Chronic respiratory failure with hypoxia: Secondary | ICD-10-CM

## 2019-11-14 DIAGNOSIS — E039 Hypothyroidism, unspecified: Secondary | ICD-10-CM | POA: Diagnosis not present

## 2019-11-14 DIAGNOSIS — K567 Ileus, unspecified: Secondary | ICD-10-CM | POA: Diagnosis not present

## 2019-11-14 DIAGNOSIS — E119 Type 2 diabetes mellitus without complications: Secondary | ICD-10-CM | POA: Diagnosis not present

## 2019-11-14 DIAGNOSIS — M109 Gout, unspecified: Secondary | ICD-10-CM | POA: Diagnosis not present

## 2019-11-14 DIAGNOSIS — I69353 Hemiplegia and hemiparesis following cerebral infarction affecting right non-dominant side: Secondary | ICD-10-CM | POA: Diagnosis not present

## 2019-11-14 DIAGNOSIS — I10 Essential (primary) hypertension: Secondary | ICD-10-CM | POA: Diagnosis not present

## 2019-11-14 DIAGNOSIS — K5900 Constipation, unspecified: Secondary | ICD-10-CM

## 2019-11-16 ENCOUNTER — Other Ambulatory Visit: Payer: Self-pay | Admitting: Family Medicine

## 2019-12-02 DIAGNOSIS — R0609 Other forms of dyspnea: Secondary | ICD-10-CM | POA: Diagnosis not present

## 2019-12-07 DIAGNOSIS — J441 Chronic obstructive pulmonary disease with (acute) exacerbation: Secondary | ICD-10-CM | POA: Diagnosis not present

## 2019-12-07 DIAGNOSIS — I5030 Unspecified diastolic (congestive) heart failure: Secondary | ICD-10-CM | POA: Diagnosis not present

## 2019-12-07 DIAGNOSIS — G4733 Obstructive sleep apnea (adult) (pediatric): Secondary | ICD-10-CM | POA: Diagnosis not present

## 2019-12-07 DIAGNOSIS — Z8673 Personal history of transient ischemic attack (TIA), and cerebral infarction without residual deficits: Secondary | ICD-10-CM | POA: Diagnosis not present

## 2019-12-12 ENCOUNTER — Ambulatory Visit (INDEPENDENT_AMBULATORY_CARE_PROVIDER_SITE_OTHER): Payer: Medicare HMO | Admitting: Gastroenterology

## 2020-01-07 DIAGNOSIS — J441 Chronic obstructive pulmonary disease with (acute) exacerbation: Secondary | ICD-10-CM | POA: Diagnosis not present

## 2020-01-07 DIAGNOSIS — Z8673 Personal history of transient ischemic attack (TIA), and cerebral infarction without residual deficits: Secondary | ICD-10-CM | POA: Diagnosis not present

## 2020-01-07 DIAGNOSIS — G4733 Obstructive sleep apnea (adult) (pediatric): Secondary | ICD-10-CM | POA: Diagnosis not present

## 2020-01-07 DIAGNOSIS — I5030 Unspecified diastolic (congestive) heart failure: Secondary | ICD-10-CM | POA: Diagnosis not present

## 2020-01-12 ENCOUNTER — Other Ambulatory Visit: Payer: Self-pay | Admitting: Family Medicine

## 2020-01-12 DIAGNOSIS — I2510 Atherosclerotic heart disease of native coronary artery without angina pectoris: Secondary | ICD-10-CM

## 2020-01-14 ENCOUNTER — Other Ambulatory Visit: Payer: Self-pay | Admitting: Family Medicine

## 2020-01-23 ENCOUNTER — Other Ambulatory Visit: Payer: Self-pay | Admitting: Family Medicine

## 2020-02-01 DIAGNOSIS — I10 Essential (primary) hypertension: Secondary | ICD-10-CM | POA: Diagnosis not present

## 2020-02-01 DIAGNOSIS — I5030 Unspecified diastolic (congestive) heart failure: Secondary | ICD-10-CM | POA: Diagnosis not present

## 2020-02-01 DIAGNOSIS — I25118 Atherosclerotic heart disease of native coronary artery with other forms of angina pectoris: Secondary | ICD-10-CM | POA: Diagnosis not present

## 2020-02-01 DIAGNOSIS — E785 Hyperlipidemia, unspecified: Secondary | ICD-10-CM | POA: Diagnosis not present

## 2020-02-01 DIAGNOSIS — I639 Cerebral infarction, unspecified: Secondary | ICD-10-CM | POA: Diagnosis not present

## 2020-02-01 DIAGNOSIS — E119 Type 2 diabetes mellitus without complications: Secondary | ICD-10-CM | POA: Diagnosis not present

## 2020-02-07 DIAGNOSIS — I5030 Unspecified diastolic (congestive) heart failure: Secondary | ICD-10-CM | POA: Diagnosis not present

## 2020-02-07 DIAGNOSIS — Z8673 Personal history of transient ischemic attack (TIA), and cerebral infarction without residual deficits: Secondary | ICD-10-CM | POA: Diagnosis not present

## 2020-02-07 DIAGNOSIS — G4733 Obstructive sleep apnea (adult) (pediatric): Secondary | ICD-10-CM | POA: Diagnosis not present

## 2020-02-07 DIAGNOSIS — J441 Chronic obstructive pulmonary disease with (acute) exacerbation: Secondary | ICD-10-CM | POA: Diagnosis not present

## 2020-02-16 DIAGNOSIS — E1165 Type 2 diabetes mellitus with hyperglycemia: Secondary | ICD-10-CM | POA: Diagnosis not present

## 2020-02-16 LAB — COMPLETE METABOLIC PANEL WITH GFR
AG Ratio: 1.3 (calc) (ref 1.0–2.5)
ALT: 6 U/L — ABNORMAL LOW (ref 9–46)
AST: 9 U/L — ABNORMAL LOW (ref 10–35)
Albumin: 3.7 g/dL (ref 3.6–5.1)
Alkaline phosphatase (APISO): 78 U/L (ref 35–144)
BUN: 15 mg/dL (ref 7–25)
CO2: 25 mmol/L (ref 20–32)
Calcium: 9 mg/dL (ref 8.6–10.3)
Chloride: 103 mmol/L (ref 98–110)
Creat: 1.12 mg/dL (ref 0.70–1.18)
GFR, Est African American: 73 mL/min/{1.73_m2} (ref >=60)
GFR, Est Non African American: 63 mL/min/{1.73_m2} (ref >=60)
Globulin: 2.9 g/dL (calc) (ref 1.9–3.7)
Glucose, Bld: 192 mg/dL — ABNORMAL HIGH (ref 65–99)
Potassium: 4.4 mmol/L (ref 3.5–5.3)
Sodium: 139 mmol/L (ref 135–146)
Total Bilirubin: 0.3 mg/dL (ref 0.2–1.2)
Total Protein: 6.6 g/dL (ref 6.1–8.1)

## 2020-02-22 ENCOUNTER — Ambulatory Visit (INDEPENDENT_AMBULATORY_CARE_PROVIDER_SITE_OTHER): Payer: Medicare HMO | Admitting: Nurse Practitioner

## 2020-02-22 ENCOUNTER — Other Ambulatory Visit: Payer: Self-pay

## 2020-02-22 ENCOUNTER — Encounter: Payer: Self-pay | Admitting: Nurse Practitioner

## 2020-02-22 VITALS — BP 148/83 | HR 70 | Ht 70.5 in | Wt 256.8 lb

## 2020-02-22 DIAGNOSIS — E038 Other specified hypothyroidism: Secondary | ICD-10-CM

## 2020-02-22 DIAGNOSIS — E559 Vitamin D deficiency, unspecified: Secondary | ICD-10-CM | POA: Diagnosis not present

## 2020-02-22 DIAGNOSIS — E1165 Type 2 diabetes mellitus with hyperglycemia: Secondary | ICD-10-CM | POA: Diagnosis not present

## 2020-02-22 DIAGNOSIS — E782 Mixed hyperlipidemia: Secondary | ICD-10-CM

## 2020-02-22 DIAGNOSIS — I10 Essential (primary) hypertension: Secondary | ICD-10-CM

## 2020-02-22 LAB — POCT GLYCOSYLATED HEMOGLOBIN (HGB A1C): Hemoglobin A1C: 8.2 % — AB (ref 4.0–5.6)

## 2020-02-22 MED ORDER — LEVOTHYROXINE SODIUM 100 MCG PO TABS: 100.0000 ug | ORAL_TABLET | Freq: Every day | ORAL | 3 refills | Status: DC

## 2020-02-22 MED ORDER — NOVOLIN 70/30 (70-30) 100 UNIT/ML ~~LOC~~ SUSP: SUBCUTANEOUS | 5 refills | Status: DC

## 2020-02-22 NOTE — Patient Instructions (Signed)

## 2020-02-22 NOTE — Progress Notes (Signed)
02/22/2020  Endocrinology follow-up note  Subjective:    Patient ID: [name], male    DOB: 1940-06-20, PCP Fayrene Helper, MD   Past Medical History:  Diagnosis Date  . Abnormality of gait 05/01/2015  . Anemia 04/19/2015  . Annual physical exam 12/11/2013  . Arthritis    "left leg; knees" (05/07/2017)  . Arthritis of knee   . CAD (coronary artery disease)   . Choking 07/13/2015  . Chronic bronchitis ([place])    "get it q yr"  . Chronic lower back pain    "since I was a teen" (05/07/2017)  . COPD (chronic obstructive pulmonary disease) ([place])   . CVA (cerebral vascular accident) ([place]) 05/2015   R sided weakness (05/07/2017)  . GERD (gastroesophageal reflux disease)   . Gout   . History of colonic polyps 04/03/2017   Added automatically from request for surgery [id number]  . Hyperlipidemia   . Hypertension   . Myocardial infarction ([place]) 1999  . Obesity   . OSA on CPAP   . Pneumonia    "a few times; last time was ~ 06/2013" (05/07/2017)  . Psoriasis   . Tussive syncope 05/01/2015  . Type II diabetes mellitus ([place])   . Vitamin B12 deficiency 05/01/2015   Past Surgical History:  Procedure Laterality Date  . APPENDECTOMY    . CATARACT EXTRACTION W/PHACO Left 10/07/2016   Procedure: CATARACT EXTRACTION PHACO AND INTRAOCULAR LENS PLACEMENT (IOC);  Surgeon: Rutherford Guys, MD;  Location: [facility];  Service: Ophthalmology;  Laterality: Left;  CDE: 13.49  . CATARACT EXTRACTION W/PHACO Right 10/28/2016   Procedure: CATARACT EXTRACTION PHACO AND INTRAOCULAR LENS PLACEMENT (IOC);  Surgeon: Rutherford Guys, MD;  Location: [facility];  Service: Ophthalmology;  Laterality: Right;  CDE: 16.71  . COLONOSCOPY  03/17/2012   Procedure: COLONOSCOPY;  Surgeon: Rogene Houston, MD;  Location: [facility];  Service: Endoscopy;  Laterality: N/A;  830  . COLONOSCOPY N/A 06/25/2017   Procedure: COLONOSCOPY;  Surgeon: Rogene Houston, MD;  Location[facility];  Service: Endoscopy;  Laterality:  N/A;  [place]- 2009-08/30/2013   "counting today's, I have 5 stents" (08/30/2013)  . CORONARY ANGIOPLASTY WITH STENT PLACEMENT  05/07/2017  . CORONARY STENT INTERVENTION N/A 05/07/2017   Procedure: CORONARY STENT INTERVENTION;  Surgeon: Charolette Forward, MD;  Location: [facility];  Service: Cardiovascular;  Laterality: N/A;  . CYSTOSCOPY WITH URETHRAL DILATATION N/A 11/26/2012   Procedure: CYSTOSCOPY WITH URETHRAL DILATATION;  Surgeon: Malka So, MD;  Location: [facility];  Service: Urology;  Laterality: N/A;  . LEFT HEART CATH AND CORONARY ANGIOGRAPHY N/A 05/07/2017   Procedure: LEFT HEART CATH AND CORONARY ANGIOGRAPHY;  Surgeon: Charolette Forward, MD;  Location: [facility];  Service: Cardiovascular;  Laterality: N/A;  . LEFT HEART CATHETERIZATION WITH CORONARY ANGIOGRAM N/A 08/30/2013   Procedure: LEFT HEART CATHETERIZATION WITH CORONARY ANGIOGRAM;  Surgeon: Clent Demark, MD;  Location: [facility];  Service: Cardiovascular;  Laterality: N/A;  . PERCUTANEOUS CORONARY STENT INTERVENTION (PCI-S) Right 08/30/2013   Procedure: PERCUTANEOUS CORONARY STENT INTERVENTION (PCI-S);  Surgeon: Clent Demark, MD;  Location: [facility] CATH LAB;  Service: Cardiovascular;  Laterality: Right;   Social History   Socioeconomic History  . Marital status: Married    Spouse name: Not on file  . Number of children: 2  . Years of education: 86  . Highest education level: 12th grade  Occupational History  . Occupation:  janitorial services     Comment: retired   Tobacco Use  . Smoking status: Former Smoker    Packs/day: 0.50    Years: 33.00    Pack years: 16.50    Types: Cigarettes    Quit date: 11/29/2002    Years since quitting: 17.2  . Smokeless tobacco: Never Used  Vaping Use  . Vaping Use: Never used  Substance and Sexual Activity  . Alcohol use: No  . Drug use: No  . Sexual activity: Not Currently  Other Topics Concern  . Not on file  Social  History Narrative   Patient drinks about 3 cups of soda daily.    Patient is left handed.    Lives alone with wife    Social Determinants of Health   Financial Resource Strain:   . Difficulty of Paying Living Expenses: Not on file  Food Insecurity: Unknown  . Worried About Charity fundraiser in the Last Year: Not on file  . Ran Out of Food in the Last Year: Never true  Transportation Needs: No Transportation Needs  . Lack of Transportation (Medical): No  . Lack of Transportation (Non-Medical): No  Physical Activity: Inactive  . Days of Exercise per Week: 0 days  . Minutes of Exercise per Session: 0 min  Stress: No Stress Concern Present  . Feeling of Stress : Not at all  Social Connections: Moderately Isolated  . Frequency of Communication with Friends and Family: Twice a week  . Frequency of Social Gatherings with Friends and Family: Once a week  . Attends Religious Services: Never  . Active Member of Clubs or Organizations: No  . Attends Archivist Meetings: Never  . Marital Status: Married   Outpatient Encounter Medications as of 02/22/2020  Medication Sig  . acetaminophen (TYLENOL) 500 MG tablet Take 1,000 mg by mouth every 6 (six) hours as needed for moderate pain (left thigh).   Marland Kitchen albuterol (VENTOLIN HFA) 108 (90 Base) MCG/ACT inhaler Inhale 1-2 puffs into the lungs every 6 (six) hours as needed for wheezing or shortness of breath.  . allopurinol (ZYLOPRIM) 300 MG tablet TAKE 1 TABLET (300 MG TOTAL) BY MOUTH DAILY.  Marland Kitchen aspirin EC 81 MG tablet Take 81 mg by mouth daily.  . budesonide-formoterol (SYMBICORT) 80-4.5 MCG/ACT inhaler Inhale 2 puffs into the lungs daily.  . clopidogrel (PLAVIX) 75 MG tablet TAKE 1 TABLET (75 MG TOTAL) BY MOUTH DAILY.  Marland Kitchen insulin NPH-regular Human (NOVOLIN 70/30) (70-30) 100 UNIT/ML injection Inject 50 units SQ with breakfast, and 45 units SQ with supper  . isosorbide mononitrate (IMDUR) 30 MG 24 hr tablet Take 30 mg by mouth daily.  Marland Kitchen  levETIRAcetam (KEPPRA) 500 MG tablet TAKE 1 TABLET TWICE DAILY  . levothyroxine (SYNTHROID) 100 MCG tablet Take 1 tablet (100 mcg total) by mouth daily before breakfast.  . metoprolol tartrate (LOPRESSOR) 50 MG tablet TAKE 1/2 TABLET TWICE DAILY  . pantoprazole (PROTONIX) 40 MG tablet TAKE 1 TABLET EVERY DAY BEFORE BREAKFAST  . polyethylene glycol (MIRALAX / GLYCOLAX) 17 g packet Take 17 g by mouth daily as needed.  . pravastatin (PRAVACHOL) 40 MG tablet TAKE 2 TABLETS EVERY DAY  . spironolactone (ALDACTONE) 25 MG tablet TAKE 1 TABLET EVERY DAY  . vitamin B-12 (CYANOCOBALAMIN) 1000 MCG tablet Take 1,000 mcg by mouth daily.  . [DISCONTINUED] levothyroxine (SYNTHROID) 112 MCG tablet Take 1 tablet (112 mcg total) by mouth daily.  . [DISCONTINUED] NOVOLIN 70/30 (70-30) 100 UNIT/ML injection INJECT 45 UNITS SUBCUTANEOUSLY  WITH BREAKFAST AND INJECT 35 UNITS WITH SUPPER (WHEN GLUCOSE IS ABOVE 90)  . nitroGLYCERIN (NITROSTAT) 0.4 MG SL tablet Place 1 tablet (0.4 mg total) under the tongue every 5 (five) minutes as needed for chest pain.   No facility-administered encounter medications on file as of 02/22/2020.   ALLERGIES: No Known Allergies VACCINATION STATUS: Immunization History  Administered Date(s) Administered  . Fluad Quad(high Dose 65+) 01/31/2019  . Influenza Whole 03/18/2005  . Influenza, High Dose Seasonal PF 02/22/2018  . Influenza,inj,Quad PF,6+ Mos 03/19/2017  . PFIZER SARS-COV-2 Vaccination 06/25/2019, 07/16/2019  . Pneumococcal Conjugate-13 12/08/2013  . Pneumococcal Polysaccharide-23 06/09/2012  . Td 12/04/2003  . Tdap 03/06/2017    Diabetes He presents for his follow-up diabetic visit. He has type 2 diabetes mellitus. Onset time: He was diagnosed at approximate age of 31 years. His disease course has been improving. There are no hypoglycemic associated symptoms. Pertinent negatives for hypoglycemia include no confusion, headaches, nervousness/anxiousness, pallor, seizures or  tremors. Pertinent negatives for diabetes include no chest pain, no fatigue, no polydipsia, no polyphagia, no polyuria, no weakness and no weight loss. There are no hypoglycemic complications. Symptoms are improving. Diabetic complications include a CVA, heart disease, nephropathy and peripheral neuropathy. Risk factors for coronary artery disease include dyslipidemia, diabetes mellitus, male sex, obesity, tobacco exposure, sedentary lifestyle and hypertension. Current diabetic treatment includes insulin injections. He is compliant with treatment most of the time. His weight is fluctuating minimally. He is following a generally unhealthy diet. When asked about meal planning, he reported none. He has not had a previous visit with a dietitian. He rarely participates in exercise. His home blood glucose trend is fluctuating minimally. His breakfast blood glucose range is generally 180-200 mg/dl. His dinner blood glucose range is generally 180-200 mg/dl. (He presents today with his meter and logs showing above target fasting and postprandial glycemic profile.  His POCT A1C today is 8.2%, increasing from last visit of 7.7%.  His Metformin was stopped at last visit due to decline in kidney function.  He denies any major episodes of hypoglycemia.) An ACE inhibitor/angiotensin II receptor blocker is not being taken. He does not see a podiatrist.Eye exam is current.  Hyperlipidemia This is a chronic problem. The current episode started more than 1 year ago. The problem is controlled. Recent lipid tests were reviewed and are variable (elevated triglycerides). Exacerbating diseases include chronic renal disease, diabetes, hypothyroidism and obesity. Factors aggravating his hyperlipidemia include beta blockers and fatty foods. Associated symptoms include shortness of breath. Pertinent negatives include no chest pain or myalgias. Current antihyperlipidemic treatment includes statins. The current treatment provides moderate  improvement of lipids. Compliance problems include adherence to exercise (r/t decreased mobility from prior CVA).  Risk factors for coronary artery disease include diabetes mellitus, dyslipidemia, hypertension, male sex, a sedentary lifestyle and obesity.  Hypertension This is a chronic problem. The current episode started more than 1 year ago. The problem has been gradually improving since onset. The problem is controlled. Associated symptoms include shortness of breath. Pertinent negatives include no chest pain, headaches, neck pain or palpitations. Agents associated with hypertension include thyroid hormones. Risk factors for coronary artery disease include dyslipidemia, diabetes mellitus, sedentary lifestyle, male gender and obesity. Past treatments include beta blockers and diuretics. The current treatment provides mild improvement. There are no compliance problems.  Hypertensive end-organ damage includes CAD/MI and CVA. Identifiable causes of hypertension include chronic renal disease and a thyroid problem.  Thyroid Problem Presents for follow-up visit. Patient reports no  anxiety, cold intolerance, constipation, depressed mood, diarrhea, fatigue, heat intolerance, palpitations, tremors, weight gain or weight loss. The symptoms have been stable. Past treatments include levothyroxine. His past medical history is significant for diabetes and hyperlipidemia.    Review of systems  Constitutional: + Minimally fluctuating body weight,  current Body mass index is 36.33 kg/m. , no fatigue, no subjective hyperthermia, no subjective hypothermia Eyes: no blurry vision, no xerophthalmia ENT: no sore throat, no nodules palpated in throat, no dysphagia/odynophagia, no hoarseness Cardiovascular: no chest pain, no shortness of breath, no palpitations, no leg swelling Respiratory: no cough, no shortness of breath Gastrointestinal: no nausea/vomiting/diarrhea Musculoskeletal: no muscle/joint aches, walks with  walker due to mobility deficits from previous CVA Skin: no rashes, no hyperemia Neurological: no tremors, no tingling, no dizziness, numbness reported in RUE and RLE (from previous stroke) Psychiatric: no depression, no anxiety   Objective:    BP (!) 148/83 (BP Location: Left Arm, Patient Position: Sitting)   Pulse 70   Ht 5' 10.5" (1.791 m)   Wt 256 lb 12.8 oz (116.5 kg)   BMI 36.33 kg/m   Wt Readings from Last 3 Encounters:  02/22/20 256 lb 12.8 oz (116.5 kg)  11/10/19 254 lb (115.2 kg)  10/20/19 254 lb 9.6 oz (115.5 kg)     BP Readings from Last 3 Encounters:  02/22/20 (!) 148/83  11/10/19 138/60  10/20/19 138/60    Physical Exam- Limited  Constitutional:  Body mass index is 36.33 kg/m. , not in acute distress, normal state of mind Eyes:  EOMI, no exophthalmos Neck: Supple Thyroid: No gross goiter Cardiovascular: RRR, no murmers, rubs, or gallops, no edema Respiratory: Adequate breathing efforts, no crackles, rales, rhonchi, or wheezing Musculoskeletal: no gross deformities, strength intact in all four extremities, no gross restriction of joint movements, walks with walker due to mobility deficits from previous CVA Skin:  no rashes, no hyperemia Neurological: no tremor with outstretched hands   POCT ABI Results 02/22/20   Right ABI:  1.22      Left ABI:  1.16  Right leg systolic / diastolic: 283/15 mmHg Left leg systolic / diastolic: 176/16 mmHg  Arm systolic / diastolic: 073/71 mmHG  Detailed report will be scanned into patient chart.   CMP     Component Value Date/Time   NA 139 02/16/2020 0838   K 4.4 02/16/2020 0838   CL 103 02/16/2020 0838   CO2 25 02/16/2020 0838   GLUCOSE 192 (H) 02/16/2020 0838   BUN 15 02/16/2020 0838   CREATININE 1.12 02/16/2020 0838   CALCIUM 9.0 02/16/2020 0838   PROT 6.6 02/16/2020 0838   ALBUMIN 2.8 (L) 09/03/2019 0449   AST 9 (L) 02/16/2020 0838   ALT 6 (L) 02/16/2020 0838   ALKPHOS 55 09/03/2019 0449   BILITOT 0.3  02/16/2020 0838   GFRNONAA 63 02/16/2020 0838   GFRAA 73 02/16/2020 0838     Diabetic Labs (most recent): Lab Results  Component Value Date   HGBA1C 8.2 (A) 02/22/2020   HGBA1C 7.7 (H) 08/29/2019   HGBA1C 7.9 (A) 06/20/2019     Lipid Panel ( most recent) Lipid Panel     Component Value Date/Time   CHOL 128 10/11/2019 0924   TRIG 283 (H) 10/11/2019 0924   HDL 24 (L) 10/11/2019 0924   CHOLHDL 5.3 (H) 10/11/2019 0924   VLDL 35 04/20/2017 1130   LDLCALC 67 10/11/2019 0924   LDLDIRECT 50 08/18/2014 0737      Assessment & Plan:  1. Type 2 diabetes mellitus with multiple competitions including diabetic nephropathy -His diabetes is complicated by coronary artery disease, CVA, CKD and patient remains at a high risk for more acute and chronic complications of diabetes which include CAD, CVA, CKD, retinopathy, and neuropathy. These are all discussed in detail with the patient.  He presents today with his meter and logs showing above target fasting and postprandial glycemic profile.  His POCT A1C today is 8.2%, increasing from last visit of 7.7%.  His Metformin was stopped at last visit due to decline in kidney function.  He denies any major episodes of hypoglycemia.  - Recent labs reviewed.  - Nutritional counseling repeated at each appointment due to patients tendency to fall back in to old habits.  - The patient admits there is a room for improvement in their diet and drink choices. -  Suggestion is made for the patient to avoid simple carbohydrates from their diet including Cakes, Sweet Desserts / Pastries, Ice Cream, Soda (diet and regular), Sweet Tea, Candies, Chips, Cookies, Sweet Pastries,  Store Bought Juices, Alcohol in Excess of  1-2 drinks a day, Artificial Sweeteners, Coffee Creamer, and "Sugar-free" Products. This will help patient to have stable blood glucose profile and potentially avoid unintended weight gain.   - I encouraged the patient to switch to  unprocessed or  minimally processed complex starch and increased protein intake (animal or plant source), fruits, and vegetables.   - Patient is advised to stick to a routine mealtimes to eat 3 meals  a day and avoid unnecessary snacks ( to snack only to correct hypoglycemia).  - I have approached patient with the following individualized plan to manage diabetes and patient agrees.  - He has done reasonably very well on premixed Novolin 70/30. He wishes to stay on the same regimen for cost reasons.   - Given the rebound in his blood glucose since discontinuation of Metformin, he will tolerate increase in his Novolin 70/30 to 50 units with breakfast and 45 units with supper, as long as his blood glucose is above 90 and he is eating.  -He is advised to continue monitoring blood glucose at least 3 times per day, before injecting insulin at breakfast and supper, and before bed.  He is instructed to call the clinic if he has readings less than 70 or greater than 300 for 3 tests in a row.  -He is warned  not to take insulin without proper monitoring per orders.  -His kidney function has improved since discontinuation of Metformin, will keep him off of it for now.  May restart low dose ER Metformin on subsequent visit if unable to achieve control of diabetes to target and kidney function remains stable.   - Patient specific target  for A1c; LDL, HDL, Triglycerides, and  Waist Circumference were discussed in detail.  2) BP/HTN:  His blood pressure is controlled to target.  He is advised to continue Metoprolol 25 mg po daily and Spironolactone 25 mg po daily.  3) Lipids/HPL:  His most recent lipid panel from 10/11/19 shows controlled LDL of 67 and elevated triglycerides of 283.  He is advised to continue Pravastatin 40 mg po daily at bedtime.  Side effects and precautions discussed with him.  He is advised to avoid fried foods and butter.  4)  Weight/Diet: His Body mass index is 36.33 kg/m.-  He is a candidate for  modest weight loss.  He cannot exercise optimally, limited by pulmonary sufficiency due to COPD.  CDE consult in progress, exercise, and carbohydrates information provided.  5) Hypothyroidism:  -He does not have recent thyroid function tests to review.  He reports compliance with his medication.  He is advised to continue Levothyroxine 100 mcg po daily before breakfast.  Will recheck TFT prior to next visit and adjust dose if necessary.   - We discussed about the correct intake of his thyroid hormone, on empty stomach at fasting, with water, separated by at least 30 minutes from breakfast and other medications,  and separated by more than 4 hours from calcium, iron, multivitamins, acid reflux medications (PPIs). -Patient is made aware of the fact that thyroid hormone replacement is needed for life, dose to be adjusted by periodic monitoring of thyroid function tests.  6) Chronic Care/Health Maintenance: -Patient is on Statin medications and encouraged to continue to follow up with Ophthalmology, nephrology, Podiatrist at least yearly or according to recommendations, and advised to stay away from smoking. I have recommended yearly flu vaccine and pneumonia vaccination at least every 5 years;  and  sleep for at least 7 hours a day.  - I advised him to pick up compression stockings to wear on bilateral lower extremities during the times, to help with dependent edema on bilateral lower extremities.   - I advised patient to maintain close follow up with Fayrene Helper, MD for primary care needs.  - Time spent on this patient care encounter:  35 min, of which > 50% was spent in  counseling and the rest reviewing his blood glucose logs , discussing his hypoglycemia and hyperglycemia episodes, reviewing his current and  previous labs / studies  ( including abstraction from other facilities) and medications  doses and developing a  long term treatment plan and documenting his care.   Please refer to  Patient Instructions for Blood Glucose Monitoring and Insulin/Medications Dosing Guide"  in media tab for additional information. Please  also refer to " Patient Self Inventory" in the Media  tab for reviewed elements of pertinent patient history.  [name] participated in the discussions, expressed understanding, and voiced agreement with the above plans.  All questions were answered to his satisfaction. he is encouraged to contact clinic should he have any questions or concerns prior to his return visit.   Follow up plan: -Return in about 4 months (around 06/24/2020) for Diabetes follow up, Thyroid follow up, Previsit labs.  Rayetta Pigg, [facility] [address] [place], [place] [address] Phone: [phone] Fax: [phone]  -  This note was partially dictated with voice recognition software. Similar sounding words can be transcribed inadequately or may not  be corrected upon review.  02/22/2020, 10:13 AM

## 2020-02-23 ENCOUNTER — Other Ambulatory Visit: Payer: Self-pay

## 2020-02-23 ENCOUNTER — Other Ambulatory Visit: Payer: Self-pay | Admitting: Family Medicine

## 2020-02-23 DIAGNOSIS — E1165 Type 2 diabetes mellitus with hyperglycemia: Secondary | ICD-10-CM

## 2020-02-23 MED ORDER — "INSULIN SYRINGE-NEEDLE U-100 31G X 5/16"" 1 ML MISC": 1.0000 mL | Freq: Two times a day (BID) | 0 refills | Status: DC

## 2020-02-27 ENCOUNTER — Other Ambulatory Visit: Payer: Self-pay

## 2020-02-27 DIAGNOSIS — E1165 Type 2 diabetes mellitus with hyperglycemia: Secondary | ICD-10-CM

## 2020-02-29 ENCOUNTER — Ambulatory Visit: Payer: Medicare HMO | Admitting: Family Medicine

## 2020-03-01 ENCOUNTER — Other Ambulatory Visit: Payer: Self-pay

## 2020-03-01 ENCOUNTER — Encounter: Payer: Self-pay | Admitting: Internal Medicine

## 2020-03-01 ENCOUNTER — Ambulatory Visit (INDEPENDENT_AMBULATORY_CARE_PROVIDER_SITE_OTHER): Payer: Medicare HMO | Admitting: Internal Medicine

## 2020-03-01 VITALS — BP 159/84 | HR 75 | Temp 97.6°F | Resp 20 | Ht 70.5 in | Wt 257.8 lb

## 2020-03-01 DIAGNOSIS — I69359 Hemiplegia and hemiparesis following cerebral infarction affecting unspecified side: Secondary | ICD-10-CM

## 2020-03-01 DIAGNOSIS — E1165 Type 2 diabetes mellitus with hyperglycemia: Secondary | ICD-10-CM

## 2020-03-01 DIAGNOSIS — K219 Gastro-esophageal reflux disease without esophagitis: Secondary | ICD-10-CM

## 2020-03-01 DIAGNOSIS — Z23 Encounter for immunization: Secondary | ICD-10-CM | POA: Diagnosis not present

## 2020-03-01 DIAGNOSIS — E038 Other specified hypothyroidism: Secondary | ICD-10-CM

## 2020-03-01 DIAGNOSIS — I10 Essential (primary) hypertension: Secondary | ICD-10-CM | POA: Diagnosis not present

## 2020-03-01 NOTE — Patient Instructions (Signed)
Please take medications regularly. Please check blood pressure at home if possible and bring the log in the next visit.  Please continue to follow up with Dr [name] for diabetes management. Please take insulin as instructed.  Please continue to follow up with your Cardiologist.  Please follow diabetic diet. Try to do mild exercise/walking, at least 150 mins/week.

## 2020-03-01 NOTE — Progress Notes (Signed)
Established Patient Office Visit  Subjective:  Patient ID: [name], male    DOB: 1940-12-20  Age: 79 y.o. MRN: [id number]  CC:  Chief Complaint  Patient presents with   Follow-up    3-4 month follow up no concerns at the moment     HPI [name] is a 79 year old male with PMH of CVA with partial right sided hemiparesis, CAD, HTN, DM, hypothyroidism and GERD presents for follow up of his chronic conditions. Patient states that he has been doing well overall. He tolerates his medications well and denies any acute complaints.  Patient's BP was 184/89, on repeat it was 159/84 about 30 mins later. He did not take his medications this morning. He denies any headache, dizziness, chest pain, dyspnea or palpitations.  Patient recently had a visit with Dr [name] for diabetes management. He checks his blood glucose everyday, and notices numbers around 200. He takes Insulin 70/30 as instructed by Dr [name].    Past Medical History:  Diagnosis Date   Abnormality of gait 05/01/2015   Anemia 04/19/2015   Annual physical exam 12/11/2013   Arthritis    "left leg; knees" (05/07/2017)   Arthritis of knee    CAD (coronary artery disease)    Choking 07/13/2015   Chronic bronchitis ([place])    "get it q yr"   Chronic lower back pain    "since I was a teen" (05/07/2017)   COPD (chronic obstructive pulmonary disease) ([place])    CVA (cerebral vascular accident) ([place]) 05/2015   R sided weakness (05/07/2017)   GERD (gastroesophageal reflux disease)    Gout    History of colonic polyps 04/03/2017   Added automatically from request for surgery [id number]   Hyperlipidemia    Hypertension    Myocardial infarction ([place]) 1999   Obesity    OSA on CPAP    Pneumonia    "a few times; last time was ~ 06/2013" (05/07/2017)   Psoriasis    Tussive syncope 05/01/2015   Type II diabetes mellitus ([place])    Vitamin B12 deficiency 05/01/2015    Past Surgical History:  Procedure  Laterality Date   APPENDECTOMY     CATARACT EXTRACTION W/PHACO Left 10/07/2016   Procedure: CATARACT EXTRACTION PHACO AND INTRAOCULAR LENS PLACEMENT ([place]);  Surgeon: Rutherford Guys, MD;  Location: [facility];  Service: Ophthalmology;  Laterality: Left;  CDE: 13.49   CATARACT EXTRACTION W/PHACO Right 10/28/2016   Procedure: CATARACT EXTRACTION PHACO AND INTRAOCULAR LENS PLACEMENT ([facility]);  Surgeon: Rutherford Guys, MD;  Location: [facility];  Service: Ophthalmology;  Laterality: Right;  CDE: 16.71   COLONOSCOPY  03/17/2012   Procedure: COLONOSCOPY;  Surgeon: Rogene Houston, MD;  Location: [facility];  Service: Endoscopy;  Laterality: N/A;  830   COLONOSCOPY N/A 06/25/2017   Procedure: COLONOSCOPY;  Surgeon: Rogene Houston, MD;  [facility]: [facility];  Service: Endoscopy;  Laterality: N/A;  [place]- 2009-08/30/2013   "counting today's, I have 5 stents" (08/30/2013)   CORONARY ANGIOPLASTY WITH STENT PLACEMENT  05/07/2017   CORONARY STENT INTERVENTION N/A 05/07/2017   Procedure: CORONARY STENT INTERVENTION;  Surgeon: Charolette Forward, MD;  Location: [facility];  Service: Cardiovascular;  Laterality: N/A;   CYSTOSCOPY WITH URETHRAL DILATATION N/A 11/26/2012   Procedure: CYSTOSCOPY WITH URETHRAL DILATATION;  Surgeon: Malka So, MD;  Location: [facility];  Service: Urology;  Laterality: N/A;   LEFT HEART CATH AND CORONARY ANGIOGRAPHY N/A  05/07/2017   Procedure: LEFT HEART CATH AND CORONARY ANGIOGRAPHY;  Surgeon: Charolette Forward, MD;  Location: [facility];  Service: Cardiovascular;  Laterality: N/A;   LEFT HEART CATHETERIZATION WITH CORONARY ANGIOGRAM N/A 08/30/2013   Procedure: LEFT HEART CATHETERIZATION WITH CORONARY ANGIOGRAM;  Surgeon: Clent Demark, MD;  Location: [facility];  Service: Cardiovascular;  Laterality: N/A;   PERCUTANEOUS CORONARY STENT INTERVENTION (PCI-S) Right 08/30/2013   Procedure: PERCUTANEOUS CORONARY STENT INTERVENTION  (PCI-S);  Surgeon: Clent Demark, MD;  Location[facility];  Service: Cardiovascular;  Laterality: Right;    Family History  Problem Relation Age of Onset   Hypertension Mother    Diabetes Mother    Hypertension Father    Diabetes Brother    Stroke Daughter    Arthritis Other        Family History    Diabetes Other        family History     Social History   Socioeconomic History   Marital status: Married    Spouse name: Not on file   Number of children: 2   Years of education: 11   Highest education level: 12th grade  Occupational History   Occupation: Brewing technologist     Comment: retired   Tobacco Use   Smoking status: Former Smoker    Packs/day: 0.50    Years: 33.00    Pack years: 16.50    Types: Cigarettes    Quit date: 11/29/2002    Years since quitting: 17.2   Smokeless tobacco: Never Used  Vaping Use   Vaping Use: Never used  Substance and Sexual Activity   Alcohol use: No   Drug use: No   Sexual activity: Not Currently  Other Topics Concern   Not on file  Social History Narrative   Patient drinks about 3 cups of soda daily.    Patient is left handed.    Lives alone with wife    Social Determinants of Health   Financial Resource Strain:    Difficulty of Paying Living Expenses: Not on file  Food Insecurity: Unknown   Worried About Charity fundraiser in the Last Year: Not on file   [employer] of Food in the Last Year: Never true  Transportation Needs: No Transportation Needs   Lack of Transportation (Medical): No   Lack of Transportation (Non-Medical): No  Physical Activity: Inactive   Days of Exercise per Week: 0 days   Minutes of Exercise per Session: 0 min  Stress: No Stress Concern Present   Feeling of Stress : Not at all  Social Connections: Moderately Isolated   Frequency of Communication with Friends and Family: Twice a week   Frequency of Social Gatherings with Friends and Family: Once a week   Attends  Religious Services: Never   Marine scientist or Organizations: No   Attends Music therapist: Never   Marital Status: Married  Human resources officer Violence:    Fear of Current or Ex-Partner: Not on file   Emotionally Abused: Not on file   Physically Abused: Not on file   Sexually Abused: Not on file    Outpatient Medications Prior to Visit  Medication Sig Dispense Refill   ACCU-CHEK AVIVA PLUS test strip TEST FOUR TIMES DAILY 400 strip 1   acetaminophen (TYLENOL) 500 MG tablet Take 1,000 mg by mouth every 6 (six) hours as needed for moderate pain (left thigh).      albuterol (VENTOLIN HFA) 108 (90  Base) MCG/ACT inhaler Inhale 1-2 puffs into the lungs every 6 (six) hours as needed for wheezing or shortness of breath. 8 g 1   allopurinol (ZYLOPRIM) 300 MG tablet TAKE 1 TABLET (300 MG TOTAL) BY MOUTH DAILY. 90 tablet 1   aspirin EC 81 MG tablet Take 81 mg by mouth daily.     budesonide-formoterol (SYMBICORT) 80-4.5 MCG/ACT inhaler Inhale 2 puffs into the lungs daily. 1 Inhaler 12   clopidogrel (PLAVIX) 75 MG tablet TAKE 1 TABLET (75 MG TOTAL) BY MOUTH DAILY. 90 tablet 0   insulin NPH-regular Human (NOVOLIN 70/30) (70-30) 100 UNIT/ML injection Inject 50 units SQ with breakfast, and 45 units SQ with supper 30 mL 5   Insulin Syringe-Needle U-100 (DROPLET INSULIN SYRINGE) 31G X 5/16" 1 ML MISC 1 mL by Does not apply route in the morning and at bedtime. 100 each 0   isosorbide mononitrate (IMDUR) 30 MG 24 hr tablet Take 30 mg by mouth daily.     levETIRAcetam (KEPPRA) 500 MG tablet TAKE 1 TABLET TWICE DAILY 180 tablet 1   levothyroxine (SYNTHROID) 100 MCG tablet Take 1 tablet (100 mcg total) by mouth daily before breakfast. 90 tablet 3   metoprolol tartrate (LOPRESSOR) 50 MG tablet TAKE 1/2 TABLET TWICE DAILY 90 tablet 1   pantoprazole (PROTONIX) 40 MG tablet TAKE 1 TABLET EVERY DAY BEFORE BREAKFAST 90 tablet 0   polyethylene glycol (MIRALAX / GLYCOLAX) 17 g  packet Take 17 g by mouth daily as needed. 14 each 0   pravastatin (PRAVACHOL) 40 MG tablet TAKE 2 TABLETS EVERY DAY 180 tablet 1   spironolactone (ALDACTONE) 25 MG tablet TAKE 1 TABLET EVERY DAY 90 tablet 1   vitamin B-12 (CYANOCOBALAMIN) 1000 MCG tablet Take 1,000 mcg by mouth daily.     nitroGLYCERIN (NITROSTAT) 0.4 MG SL tablet Place 1 tablet (0.4 mg total) under the tongue every 5 (five) minutes as needed for chest pain. 100 tablet 3   No facility-administered medications prior to visit.    No Known Allergies  ROS Review of Systems  Constitutional: Negative for chills and fever.  HENT: Negative for congestion, postnasal drip, rhinorrhea, sinus pressure, sinus pain and sore throat.   Eyes: Negative for pain and discharge.  Respiratory: Negative for cough and shortness of breath.   Cardiovascular: Negative for chest pain and palpitations.  Gastrointestinal: Negative for constipation, diarrhea, nausea and vomiting.  Endocrine: Negative for polydipsia and polyuria.  Genitourinary: Negative for dysuria and hematuria.  Musculoskeletal: Negative for neck pain and neck stiffness.  Skin: Negative for pallor and wound.  Neurological: Negative for dizziness, weakness, numbness and headaches.  Psychiatric/Behavioral: Negative for agitation and behavioral problems.      Objective:    Physical Exam Vitals reviewed.  Constitutional:      General: He is not in acute distress.    Appearance: He is obese. He is not diaphoretic.  HENT:     Head: Normocephalic and atraumatic.     Nose: Nose normal.     Mouth/Throat:     Mouth: Mucous membranes are moist.  Eyes:     General: No scleral icterus.    Extraocular Movements: Extraocular movements intact.     Pupils: Pupils are equal, round, and reactive to light.  Cardiovascular:     Rate and Rhythm: Normal rate and regular rhythm.     Heart sounds: Normal heart sounds. No murmur heard.  No friction rub.  Pulmonary:     Breath sounds:  Normal breath sounds. No  wheezing or rales.  Abdominal:     Palpations: Abdomen is soft.     Tenderness: There is no abdominal tenderness.  Musculoskeletal:     Cervical back: Neck supple. No tenderness.     Right lower leg: No edema.     Left lower leg: No edema.  Skin:    General: Skin is warm.     Coloration: Skin is not jaundiced.     Findings: Rash (whitish plaques on b/l elbows, chronic) present. No erythema.  Neurological:     General: No focal deficit present.     Mental Status: He is alert and oriented to person, place, and time.     Sensory: No sensory deficit.     Motor: No weakness.  Psychiatric:        Mood and Affect: Mood normal.        Behavior: Behavior normal.     BP (!) 159/84 (BP Location: Right Arm, Patient Position: Sitting)    Pulse 75    Temp 97.6 F (36.4 C) (Temporal)    Resp 20    Ht 5' 10.5" (1.791 m)    Wt 257 lb 12.8 oz (116.9 kg)    SpO2 94%    BMI 36.47 kg/m  Wt Readings from Last 3 Encounters:  03/01/20 257 lb 12.8 oz (116.9 kg)  02/22/20 256 lb 12.8 oz (116.5 kg)  11/10/19 254 lb (115.2 kg)     Health Maintenance Due  Topic Date Due   Hepatitis C Screening  Never done   FOOT EXAM  07/15/2019    There are no preventive care reminders to display for this patient.  Lab Results  Component Value Date   TSH 1.05 10/11/2019   Lab Results  Component Value Date   WBC 10.1 09/03/2019   HGB 9.8 (L) 09/03/2019   HCT 32.0 (L) 09/03/2019   MCV 90.4 09/03/2019   PLT 257 09/03/2019   Lab Results  Component Value Date   NA 139 02/16/2020   K 4.4 02/16/2020   CO2 25 02/16/2020   GLUCOSE 192 (H) 02/16/2020   BUN 15 02/16/2020   CREATININE 1.12 02/16/2020   BILITOT 0.3 02/16/2020   ALKPHOS 55 09/03/2019   AST 9 (L) 02/16/2020   ALT 6 (L) 02/16/2020   PROT 6.6 02/16/2020   ALBUMIN 2.8 (L) 09/03/2019   CALCIUM 9.0 02/16/2020   ANIONGAP 9 09/04/2019   Lab Results  Component Value Date   CHOL 128 10/11/2019   Lab Results    Component Value Date   HDL 24 (L) 10/11/2019   Lab Results  Component Value Date   LDLCALC 67 10/11/2019   Lab Results  Component Value Date   TRIG 283 (H) 10/11/2019   Lab Results  Component Value Date   CHOLHDL 5.3 (H) 10/11/2019   Lab Results  Component Value Date   HGBA1C 8.2 (A) 02/22/2020      Assessment & Plan:   Problem List Items Addressed This Visit   HTN Uncontrolled BP: (!) 159/84 Patient did not have meds today, advised to stay compliant Follow DASH diet and continue to walk at least 150 mins/week On Metoprolol 25 mg BID, Spironolactone 25 mg QD and Imdur 30 mg QD Advised to check BP at home and bring the log in the next visit, may have to increase Metoprolol if persistent high BP  DM Uncontrolled, last HbA1C: 8.2 Follows up with Dr [name], on Insulin 70/30, takes 50 U qAM and 40 U every evening  Hypothyroidism TSH: 1.05 (09/2019) On Levothyroxine 100 mcg QD  H/o CVA s/p right UE paresis On DAPT and statin Has residual right UE paresis Muscle strength better now  GERD On Pantoprazole 40 mg QD     Other Visit Diagnoses    Need for immunization against influenza       Relevant Orders   Flu Vaccine QUAD High Dose(Fluad) (Completed)      No orders of the defined types were placed in this encounter.   Follow-up: Return in about 4 months (around 07/02/2020).    Lindell Spar, MD

## 2020-03-08 DIAGNOSIS — G4733 Obstructive sleep apnea (adult) (pediatric): Secondary | ICD-10-CM | POA: Diagnosis not present

## 2020-03-08 DIAGNOSIS — I5030 Unspecified diastolic (congestive) heart failure: Secondary | ICD-10-CM | POA: Diagnosis not present

## 2020-03-08 DIAGNOSIS — J441 Chronic obstructive pulmonary disease with (acute) exacerbation: Secondary | ICD-10-CM | POA: Diagnosis not present

## 2020-03-08 DIAGNOSIS — Z8673 Personal history of transient ischemic attack (TIA), and cerebral infarction without residual deficits: Secondary | ICD-10-CM | POA: Diagnosis not present

## 2020-03-09 ENCOUNTER — Other Ambulatory Visit: Payer: Self-pay

## 2020-03-09 DIAGNOSIS — E1165 Type 2 diabetes mellitus with hyperglycemia: Secondary | ICD-10-CM

## 2020-03-09 MED ORDER — "INSULIN SYRINGE-NEEDLE U-100 31G X 5/16"" 1 ML MISC": 1.0000 mL | Freq: Two times a day (BID) | 0 refills | Status: DC

## 2020-03-09 MED ORDER — TRUE METRIX LEVEL 1 LOW VI SOLN: 0 refills | Status: AC

## 2020-03-09 MED ORDER — TRUEPLUS LANCETS 33G MISC: 3 refills | Status: DC

## 2020-03-12 ENCOUNTER — Other Ambulatory Visit: Payer: Self-pay

## 2020-03-12 MED ORDER — PANTOPRAZOLE SODIUM 40 MG PO TBEC: DELAYED_RELEASE_TABLET | ORAL | 1 refills | Status: DC

## 2020-03-13 ENCOUNTER — Other Ambulatory Visit: Payer: Self-pay

## 2020-03-13 ENCOUNTER — Other Ambulatory Visit: Payer: Self-pay | Admitting: Family Medicine

## 2020-03-13 MED ORDER — CLOPIDOGREL BISULFATE 75 MG PO TABS
75.0000 mg | ORAL_TABLET | Freq: Every day | ORAL | 0 refills | Status: DC
Start: 2020-03-13 — End: 2020-03-14

## 2020-03-21 ENCOUNTER — Other Ambulatory Visit: Payer: Self-pay | Admitting: "Endocrinology

## 2020-03-21 ENCOUNTER — Other Ambulatory Visit: Payer: Self-pay | Admitting: Family Medicine

## 2020-04-08 DIAGNOSIS — J441 Chronic obstructive pulmonary disease with (acute) exacerbation: Secondary | ICD-10-CM | POA: Diagnosis not present

## 2020-04-08 DIAGNOSIS — G4733 Obstructive sleep apnea (adult) (pediatric): Secondary | ICD-10-CM | POA: Diagnosis not present

## 2020-04-08 DIAGNOSIS — I5030 Unspecified diastolic (congestive) heart failure: Secondary | ICD-10-CM | POA: Diagnosis not present

## 2020-04-08 DIAGNOSIS — Z8673 Personal history of transient ischemic attack (TIA), and cerebral infarction without residual deficits: Secondary | ICD-10-CM | POA: Diagnosis not present

## 2020-04-26 ENCOUNTER — Other Ambulatory Visit: Payer: Self-pay | Admitting: Family Medicine

## 2020-04-26 DIAGNOSIS — E1165 Type 2 diabetes mellitus with hyperglycemia: Secondary | ICD-10-CM

## 2020-05-02 DIAGNOSIS — E119 Type 2 diabetes mellitus without complications: Secondary | ICD-10-CM | POA: Diagnosis not present

## 2020-05-02 DIAGNOSIS — I5030 Unspecified diastolic (congestive) heart failure: Secondary | ICD-10-CM | POA: Diagnosis not present

## 2020-05-02 DIAGNOSIS — E559 Vitamin D deficiency, unspecified: Secondary | ICD-10-CM | POA: Diagnosis not present

## 2020-05-02 DIAGNOSIS — I25118 Atherosclerotic heart disease of native coronary artery with other forms of angina pectoris: Secondary | ICD-10-CM | POA: Diagnosis not present

## 2020-05-02 DIAGNOSIS — I10 Essential (primary) hypertension: Secondary | ICD-10-CM | POA: Diagnosis not present

## 2020-05-02 DIAGNOSIS — E785 Hyperlipidemia, unspecified: Secondary | ICD-10-CM | POA: Diagnosis not present

## 2020-05-02 DIAGNOSIS — I639 Cerebral infarction, unspecified: Secondary | ICD-10-CM | POA: Diagnosis not present

## 2020-05-23 ENCOUNTER — Other Ambulatory Visit: Payer: Self-pay | Admitting: Family Medicine

## 2020-06-06 ENCOUNTER — Other Ambulatory Visit: Payer: Self-pay | Admitting: Family Medicine

## 2020-06-10 ENCOUNTER — Other Ambulatory Visit: Payer: Self-pay | Admitting: Family Medicine

## 2020-06-10 DIAGNOSIS — E1165 Type 2 diabetes mellitus with hyperglycemia: Secondary | ICD-10-CM

## 2020-06-19 ENCOUNTER — Telehealth: Payer: Self-pay

## 2020-06-19 NOTE — Telephone Encounter (Signed)
[employer] is calling requesting a refill of isosorbide 30mg  but I'm not sure if we prescribe it or not. Wanted to make sure before I refilled. Please advise

## 2020-06-20 ENCOUNTER — Other Ambulatory Visit: Payer: Self-pay

## 2020-06-20 DIAGNOSIS — E038 Other specified hypothyroidism: Secondary | ICD-10-CM | POA: Diagnosis not present

## 2020-06-20 DIAGNOSIS — E1165 Type 2 diabetes mellitus with hyperglycemia: Secondary | ICD-10-CM | POA: Diagnosis not present

## 2020-06-20 MED ORDER — ISOSORBIDE MONONITRATE ER 30 MG PO TB24
30.0000 mg | ORAL_TABLET | Freq: Every day | ORAL | 2 refills | Status: DC
Start: 2020-06-20 — End: 2021-01-03

## 2020-06-20 NOTE — Telephone Encounter (Signed)
Yes please prescribe 90 days with two refill, he is on this

## 2020-06-21 LAB — HEMOGLOBIN A1C
Est. average glucose Bld gHb Est-mCnc: 192 mg/dL
Hgb A1c MFr Bld: 8.3 % — ABNORMAL HIGH (ref 4.8–5.6)

## 2020-06-21 LAB — COMPREHENSIVE METABOLIC PANEL
ALT: 11 IU/L (ref 0–44)
AST: 13 IU/L (ref 0–40)
Albumin/Globulin Ratio: 1.3 (ref 1.2–2.2)
Albumin: 3.9 g/dL (ref 3.7–4.7)
Alkaline Phosphatase: 82 IU/L (ref 44–121)
BUN/Creatinine Ratio: 14 (ref 10–24)
BUN: 21 mg/dL (ref 8–27)
Bilirubin Total: 0.3 mg/dL (ref 0.0–1.2)
CO2: 22 mmol/L (ref 20–29)
Calcium: 9.4 mg/dL (ref 8.6–10.2)
Chloride: 101 mmol/L (ref 96–106)
Creatinine, Ser: 1.45 mg/dL — ABNORMAL HIGH (ref 0.76–1.27)
GFR calc Af Amer: 53 mL/min/{1.73_m2} — ABNORMAL LOW (ref >59)
GFR calc non Af Amer: 46 mL/min/{1.73_m2} — ABNORMAL LOW (ref >59)
Globulin, Total: 3.1 g/dL (ref 1.5–4.5)
Glucose: 156 mg/dL — ABNORMAL HIGH (ref 65–99)
Potassium: 5.4 mmol/L — ABNORMAL HIGH (ref 3.5–5.2)
Sodium: 138 mmol/L (ref 134–144)
Total Protein: 7 g/dL (ref 6.0–8.5)

## 2020-06-21 LAB — T4, FREE: Free T4: 1.33 ng/dL (ref 0.82–1.77)

## 2020-06-21 LAB — TSH: TSH: 2.12 u[IU]/mL (ref 0.450–4.500)

## 2020-06-22 ENCOUNTER — Other Ambulatory Visit: Payer: Self-pay | Admitting: Family Medicine

## 2020-06-25 ENCOUNTER — Other Ambulatory Visit: Payer: Self-pay | Admitting: Family Medicine

## 2020-06-27 ENCOUNTER — Encounter: Payer: Self-pay | Admitting: Nurse Practitioner

## 2020-06-27 ENCOUNTER — Other Ambulatory Visit: Payer: Self-pay

## 2020-06-27 ENCOUNTER — Ambulatory Visit: Payer: Medicare HMO | Admitting: Nurse Practitioner

## 2020-06-27 VITALS — BP 161/77 | HR 74 | Ht 70.5 in | Wt 262.0 lb

## 2020-06-27 DIAGNOSIS — E1165 Type 2 diabetes mellitus with hyperglycemia: Secondary | ICD-10-CM

## 2020-06-27 DIAGNOSIS — I10 Essential (primary) hypertension: Secondary | ICD-10-CM

## 2020-06-27 DIAGNOSIS — E782 Mixed hyperlipidemia: Secondary | ICD-10-CM | POA: Diagnosis not present

## 2020-06-27 DIAGNOSIS — E038 Other specified hypothyroidism: Secondary | ICD-10-CM | POA: Diagnosis not present

## 2020-06-27 DIAGNOSIS — E559 Vitamin D deficiency, unspecified: Secondary | ICD-10-CM

## 2020-06-27 MED ORDER — NOVOLIN 70/30 (70-30) 100 UNIT/ML ~~LOC~~ SUSP
45.0000 [IU] | Freq: Two times a day (BID) | SUBCUTANEOUS | 5 refills | Status: DC
Start: 2020-06-27 — End: 2020-07-11

## 2020-06-27 NOTE — Patient Instructions (Signed)
Advice for Weight Management  -For most of us the best way to lose weight is by diet management. Generally speaking, diet management means consuming less calories intentionally which over time brings about progressive weight loss.  This can be achieved more effectively by restricting carbohydrate consumption to the minimum possible.  So, it is critically important to know your numbers: how much calorie you are consuming and how much calorie you need. More importantly, our carbohydrates sources should be unprocessed or minimally processed complex starch food items.   Sometimes, it is important to balance nutrition by increasing protein intake (animal or plant source), fruits, and vegetables.  -Sticking to a routine mealtime to eat 3 meals a day and avoiding unnecessary snacks is shown to have a big role in weight control. Under normal circumstances, the only time we lose real weight is when we are hungry, so allow hunger to take place- hunger means no food between meal times, only water.  It is not advisable to starve.   -It is better to avoid simple carbohydrates including: Cakes, Sweet Desserts, Ice Cream, Soda (diet and regular), Sweet Tea, Candies, Chips, Cookies, Store Bought Juices, Alcohol in Excess of  1-2 drinks a day, Artificial Sweeteners, Doughnuts, Coffee Creamers, "Sugar-free" Products, etc, etc.  This is not a complete list.....    -Consulting with certified diabetes educators is proven to provide you with the most accurate and current information on diet.  Also, you may be  interested in discussing diet options/exchanges , we can schedule a visit with [name], RDN, CDE for individualized nutrition education.  -Exercise: If you are able: 30 -60 minutes a day ,4 days a week, or 150 minutes a week.  The longer the better.  Combine stretch, strength, and aerobic activities.  If you were told in the past that you have high risk for cardiovascular diseases, you may seek evaluation by  your heart doctor prior to initiating moderate to intense exercise programs.    

## 2020-06-27 NOTE — Progress Notes (Signed)
06/27/2020  Endocrinology follow-up note  Subjective:    Patient ID: [name], male    DOB: 07-07-1940, PCP Fayrene Helper, MD   Past Medical History:  Diagnosis Date  . Abnormality of gait 05/01/2015  . Anemia 04/19/2015  . Annual physical exam 12/11/2013  . Arthritis    "left leg; knees" (05/07/2017)  . Arthritis of knee   . CAD (coronary artery disease)   . Choking 07/13/2015  . Chronic bronchitis ([place])    "get it q yr"  . Chronic lower back pain    "since I was a teen" (05/07/2017)  . COPD (chronic obstructive pulmonary disease) ([place])   . CVA (cerebral vascular accident) ([place]) 05/2015   R sided weakness (05/07/2017)  . GERD (gastroesophageal reflux disease)   . Gout   . History of colonic polyps 04/03/2017   Added automatically from request for surgery [id number]  . Hyperlipidemia   . Hypertension   . Myocardial infarction ([place]) 1999  . Obesity   . OSA on CPAP   . Pneumonia    "a few times; last time was ~ 06/2013" (05/07/2017)  . Psoriasis   . Tussive syncope 05/01/2015  . Type II diabetes mellitus ([place])   . Vitamin B12 deficiency 05/01/2015   Past Surgical History:  Procedure Laterality Date  . APPENDECTOMY    . CATARACT EXTRACTION W/PHACO Left 10/07/2016   Procedure: CATARACT EXTRACTION PHACO AND INTRAOCULAR LENS PLACEMENT (IOC);  Surgeon: Rutherford Guys, MD;  Location: [facility];  Service: Ophthalmology;  Laterality: Left;  CDE: 13.49  . CATARACT EXTRACTION W/PHACO Right 10/28/2016   Procedure: CATARACT EXTRACTION PHACO AND INTRAOCULAR LENS PLACEMENT (IOC);  Surgeon: Rutherford Guys, MD;  Location: [facility];  Service: Ophthalmology;  Laterality: Right;  CDE: 16.71  . COLONOSCOPY  03/17/2012   Procedure: COLONOSCOPY;  Surgeon: Rogene Houston, MD;  Location: [facility];  Service: Endoscopy;  Laterality: N/A;  830  . COLONOSCOPY N/A 06/25/2017   Procedure: COLONOSCOPY;  Surgeon: Rogene Houston, MD;  Location: [facility];  Service: Endoscopy;  Laterality: N/A;   [place]- 2009-08/30/2013   "counting today's, I have 5 stents" (08/30/2013)  . CORONARY ANGIOPLASTY WITH STENT PLACEMENT  05/07/2017  . CORONARY STENT INTERVENTION N/A 05/07/2017   Procedure: CORONARY STENT INTERVENTION;  Surgeon: Charolette Forward, MD;  Location: [place] [facility];  Service: Cardiovascular;  Laterality: N/A;  . CYSTOSCOPY WITH URETHRAL DILATATION N/A 11/26/2012   Procedure: CYSTOSCOPY WITH URETHRAL DILATATION;  Surgeon: Malka So, MD;  Location: [facility];  Service: Urology;  Laterality: N/A;  . LEFT HEART CATH AND CORONARY ANGIOGRAPHY N/A 05/07/2017   Procedure: LEFT HEART CATH AND CORONARY ANGIOGRAPHY;  Surgeon: Charolette Forward, MD;  Location: [place] CV LAB;  Service: Cardiovascular;  Laterality: N/A;  . LEFT HEART CATHETERIZATION WITH CORONARY ANGIOGRAM N/A 08/30/2013   Procedure: LEFT HEART CATHETERIZATION WITH CORONARY ANGIOGRAM;  Surgeon: Clent Demark, MD;  Location: [place] [facility] CATH LAB;  Service: Cardiovascular;  Laterality: N/A;  . PERCUTANEOUS CORONARY STENT INTERVENTION (PCI-S) Right 08/30/2013   Procedure: PERCUTANEOUS CORONARY STENT INTERVENTION (PCI-S);  Surgeon: Clent Demark, MD;  Location: [facility] CATH LAB;  Service: Cardiovascular;  Laterality: Right;   Social History   Socioeconomic History  . Marital status: Married    Spouse name: Not on file  . Number of children: 2  . Years of education: 72  . Highest education level: 12th grade  Occupational History  . Occupation:  janitorial services     Comment: retired   Tobacco Use  . Smoking status: Former Smoker    Packs/day: 0.50    Years: 33.00    Pack years: 16.50    Types: Cigarettes    Quit date: 11/29/2002    Years since quitting: 17.5  . Smokeless tobacco: Never Used  Vaping Use  . Vaping Use: Never used  Substance and Sexual Activity  . Alcohol use: No  . Drug use: No  . Sexual activity: Not Currently  Other Topics Concern  . Not on file  Social  History Narrative   Patient drinks about 3 cups of soda daily.    Patient is left handed.    Lives alone with wife    Social Determinants of Health   Financial Resource Strain: Not on file  Food Insecurity: Unknown  . Worried About Charity fundraiser in the Last Year: Not on file  . Ran Out of Food in the Last Year: Never true  Transportation Needs: No Transportation Needs  . Lack of Transportation (Medical): No  . Lack of Transportation (Non-Medical): No  Physical Activity: Inactive  . Days of Exercise per Week: 0 days  . Minutes of Exercise per Session: 0 min  Stress: No Stress Concern Present  . Feeling of Stress : Not at all  Social Connections: Moderately Isolated  . Frequency of Communication with Friends and Family: Twice a week  . Frequency of Social Gatherings with Friends and Family: Once a week  . Attends Religious Services: Never  . Active Member of Clubs or Organizations: No  . Attends Archivist Meetings: Never  . Marital Status: Married   Outpatient Encounter Medications as of 06/27/2020  Medication Sig  . ACCU-CHEK AVIVA PLUS test strip TEST FOUR TIMES DAILY  . acetaminophen (TYLENOL) 500 MG tablet Take 1,000 mg by mouth every 6 (six) hours as needed for moderate pain (left thigh).   Marland Kitchen albuterol (VENTOLIN HFA) 108 (90 Base) MCG/ACT inhaler Inhale 1-2 puffs into the lungs every 6 (six) hours as needed for wheezing or shortness of breath.  . allopurinol (ZYLOPRIM) 300 MG tablet TAKE 1 TABLET (300 MG TOTAL) BY MOUTH DAILY.  Marland Kitchen aspirin EC 81 MG tablet Take 81 mg by mouth daily.  . Blood Glucose Calibration (TRUE METRIX LEVEL 1) Low SOLN Use as directed  . clopidogrel (PLAVIX) 75 MG tablet TAKE 1 TABLET EVERY DAY  . DROPLET INSULIN SYRINGE 31G X 5/16" 1 ML MISC USE AS DIRECTED IN THE MORNING AND AT BEDTIME  . insulin NPH-regular Human (NOVOLIN 70/30) (70-30) 100 UNIT/ML injection Inject 50 units SQ with breakfast, and 45 units SQ with supper  . isosorbide  mononitrate (IMDUR) 30 MG 24 hr tablet Take 1 tablet (30 mg total) by mouth daily.  Marland Kitchen levETIRAcetam (KEPPRA) 500 MG tablet TAKE 1 TABLET TWICE DAILY  . levothyroxine (SYNTHROID) 100 MCG tablet TAKE 1 TABLET (100 MCG TOTAL) BY MOUTH DAILY BEFORE BREAKFAST.  . metoprolol tartrate (LOPRESSOR) 50 MG tablet TAKE 1/2 TABLET TWICE DAILY  . nitroGLYCERIN (NITROSTAT) 0.4 MG SL tablet Place 1 tablet (0.4 mg total) under the tongue every 5 (five) minutes as needed for chest pain.  . pantoprazole (PROTONIX) 40 MG tablet TAKE 1 TABLET EVERY DAY BEFORE BREAKFAST  . polyethylene glycol (MIRALAX / GLYCOLAX) 17 g packet Take 17 g by mouth daily as needed.  . pravastatin (PRAVACHOL) 40 MG tablet TAKE 2 TABLETS EVERY DAY  . spironolactone (ALDACTONE) 25 MG tablet TAKE  1 TABLET EVERY DAY  . TRUEplus Lancets 33G MISC TEST BLOOD SUGAR FOUR TIMES DAILY  . vitamin B-12 (CYANOCOBALAMIN) 1000 MCG tablet Take 1,000 mcg by mouth daily.  . budesonide-formoterol (SYMBICORT) 80-4.5 MCG/ACT inhaler Inhale 2 puffs into the lungs daily. (Patient not taking: Reported on 06/27/2020)   No facility-administered encounter medications on file as of 06/27/2020.   ALLERGIES: No Known Allergies VACCINATION STATUS: Immunization History  Administered Date(s) Administered  . Fluad Quad(high Dose 65+) 01/31/2019, 03/01/2020  . Influenza Whole 03/18/2005  . Influenza, High Dose Seasonal PF 02/22/2018  . Influenza,inj,Quad PF,6+ Mos 03/19/2017  . PFIZER(Purple Top)SARS-COV-2 Vaccination 06/25/2019, 07/16/2019  . Pneumococcal Conjugate-13 12/08/2013  . Pneumococcal Polysaccharide-23 06/09/2012  . Td 12/04/2003  . Tdap 03/06/2017    Diabetes He presents for his follow-up diabetic visit. He has type 2 diabetes mellitus. Onset time: He was diagnosed at approximate age of 68 years. His disease course has been stable. There are no hypoglycemic associated symptoms. Pertinent negatives for hypoglycemia include no confusion, headaches,  nervousness/anxiousness, pallor, seizures or tremors. Pertinent negatives for diabetes include no chest pain, no fatigue, no polydipsia, no polyphagia, no polyuria, no weakness and no weight loss. There are no hypoglycemic complications. Symptoms are stable. Diabetic complications include a CVA, heart disease, nephropathy and peripheral neuropathy. Risk factors for coronary artery disease include dyslipidemia, diabetes mellitus, male sex, obesity, tobacco exposure, sedentary lifestyle and hypertension. Current diabetic treatment includes insulin injections. He is compliant with treatment most of the time. His weight is increasing steadily. He is following a generally unhealthy diet. When asked about meal planning, he reported none. He has not had a previous visit with a dietitian. He rarely participates in exercise. His home blood glucose trend is fluctuating minimally. His breakfast blood glucose range is generally 140-180 mg/dl. His dinner blood glucose range is generally 180-200 mg/dl. (He presents today with his logs, no meter, showing persistently high glycemic profile both fasting and postprandial.  His previsit A1c was 8.3%, essentially unchanged from last visit.  He denies any episodes of hypoglycemia.) An ACE inhibitor/angiotensin II receptor blocker is not being taken. He does not see a podiatrist.Eye exam is current.  Hyperlipidemia This is a chronic problem. The current episode started more than 1 year ago. The problem is controlled. Recent lipid tests were reviewed and are variable (elevated triglycerides). Exacerbating diseases include chronic renal disease, diabetes, hypothyroidism and obesity. Factors aggravating his hyperlipidemia include beta blockers and fatty foods. Associated symptoms include shortness of breath. Pertinent negatives include no chest pain or myalgias. Current antihyperlipidemic treatment includes statins. The current treatment provides moderate improvement of lipids. Compliance  problems include adherence to exercise (r/t decreased mobility from prior CVA).  Risk factors for coronary artery disease include diabetes mellitus, dyslipidemia, hypertension, male sex, a sedentary lifestyle and obesity.  Hypertension This is a chronic problem. The current episode started more than 1 year ago. The problem has been gradually improving since onset. The problem is controlled. Associated symptoms include shortness of breath. Pertinent negatives include no chest pain, headaches, neck pain or palpitations. Agents associated with hypertension include thyroid hormones. Risk factors for coronary artery disease include dyslipidemia, diabetes mellitus, sedentary lifestyle, male gender and obesity. Past treatments include beta blockers and diuretics. The current treatment provides mild improvement. There are no compliance problems.  Hypertensive end-organ damage includes CAD/MI and CVA. Identifiable causes of hypertension include chronic renal disease and a thyroid problem.  Thyroid Problem Presents for follow-up visit. Patient reports no anxiety, cold intolerance, constipation,  depressed mood, diarrhea, fatigue, heat intolerance, palpitations, tremors, weight gain or weight loss. The symptoms have been stable. Past treatments include levothyroxine. His past medical history is significant for diabetes and hyperlipidemia.    Review of systems  Constitutional: + Minimally fluctuating body weight,  current Body mass index is 37.06 kg/m. , no fatigue, no subjective hyperthermia, no subjective hypothermia Eyes: no blurry vision, no xerophthalmia ENT: no sore throat, no nodules palpated in throat, no dysphagia/odynophagia, no hoarseness Cardiovascular: no chest pain, no shortness of breath, no palpitations, no leg swelling Respiratory: no cough, no shortness of breath Gastrointestinal: no nausea/vomiting/diarrhea Musculoskeletal: no muscle/joint aches, walks with walker due to mobility deficits from  previous CVA Skin: no rashes, no hyperemia Neurological: no tremors, no tingling, no dizziness, numbness reported in RUE and RLE (from previous stroke) Psychiatric: no depression, no anxiety   Objective:    BP (!) 161/77 (BP Location: Right Arm, Patient Position: Sitting)   Pulse 74   Ht 5' 10.5" (1.791 m)   Wt 262 lb (118.8 kg)   BMI 37.06 kg/m   Wt Readings from Last 3 Encounters:  06/27/20 262 lb (118.8 kg)  03/01/20 257 lb 12.8 oz (116.9 kg)  02/22/20 256 lb 12.8 oz (116.5 kg)     BP Readings from Last 3 Encounters:  06/27/20 (!) 161/77  03/01/20 (!) 159/84  02/22/20 (!) 148/83    Physical Exam- Limited  Constitutional:  Body mass index is 37.06 kg/m. , not in acute distress, normal state of mind Eyes:  EOMI, no exophthalmos Neck: Supple Cardiovascular: RRR, no murmers, rubs, or gallops, no edema Respiratory: Adequate breathing efforts, no crackles, rales, rhonchi, or wheezing Musculoskeletal: no gross deformities, strength intact in all four extremities, no gross restriction of joint movements, walks with walker due to mobility deficits from previous CVA Skin:  no rashes, no hyperemia Neurological: no tremor with outstretched hands   Foot exam:   No rashes, ulcers, cuts, + calluses, + onychodystrophy.   Poor foot hygiene   Dry flaky skin  Diminished pulses bilat.  Areas of flat, dark pigmentation to BLE extending up calves   Good sensation to 10 g monofilament bilat.   CMP     Component Value Date/Time   NA 138 06/20/2020 0907   K 5.4 (H) 06/20/2020 0907   CL 101 06/20/2020 0907   CO2 22 06/20/2020 0907   GLUCOSE 156 (H) 06/20/2020 0907   GLUCOSE 192 (H) 02/16/2020 0838   BUN 21 06/20/2020 0907   CREATININE 1.45 (H) 06/20/2020 0907   CREATININE 1.12 02/16/2020 0838   CALCIUM 9.4 06/20/2020 0907   PROT 7.0 06/20/2020 0907   ALBUMIN 3.9 06/20/2020 0907   AST 13 06/20/2020 0907   ALT 11 06/20/2020 0907   ALKPHOS 82 06/20/2020 0907   BILITOT  0.3 06/20/2020 0907   GFRNONAA 46 (L) 06/20/2020 0907   GFRNONAA 63 02/16/2020 0838   GFRAA 53 (L) 06/20/2020 0907   GFRAA 73 02/16/2020 0838     Diabetic Labs (most recent): Lab Results  Component Value Date   HGBA1C 8.3 (H) 06/20/2020   HGBA1C 8.2 (A) 02/22/2020   HGBA1C 7.7 (H) 08/29/2019     Lipid Panel ( most recent) Lipid Panel     Component Value Date/Time   CHOL 128 10/11/2019 0924   TRIG 283 (H) 10/11/2019 0924   HDL 24 (L) 10/11/2019 0924   CHOLHDL 5.3 (H) 10/11/2019 0924   VLDL 35 04/20/2017 1130   LDLCALC 67 10/11/2019 1601  LDLDIRECT 50 08/18/2014 0737      Assessment & Plan:   1) Type 2 diabetes mellitus with multiple competitions including diabetic nephropathy  -His diabetes is complicated by coronary artery disease, CVA, CKD and patient remains at a high risk for more acute and chronic complications of diabetes which include CAD, CVA, CKD, retinopathy, and neuropathy. These are all discussed in detail with the patient.  He presents today with his logs, no meter, showing persistently high glycemic profile both fasting and postprandial.  His previsit A1c was 8.3%, essentially unchanged from last visit.  He denies any episodes of hypoglycemia.  - Recent labs reviewed.  - Nutritional counseling repeated at each appointment due to patients tendency to fall back in to old habits.  - The patient admits there is a room for improvement in their diet and drink choices. -  Suggestion is made for the patient to avoid simple carbohydrates from their diet including Cakes, Sweet Desserts / Pastries, Ice Cream, Soda (diet and regular), Sweet Tea, Candies, Chips, Cookies, Sweet Pastries,  Store Bought Juices, Alcohol in Excess of  1-2 drinks a day, Artificial Sweeteners, Coffee Creamer, and "Sugar-free" Products. This will help patient to have stable blood glucose profile and potentially avoid unintended weight gain.   - I encouraged the patient to switch to  unprocessed  or minimally processed complex starch and increased protein intake (animal or plant source), fruits, and vegetables.   - Patient is advised to stick to a routine mealtimes to eat 3 meals  a day and avoid unnecessary snacks ( to snack only to correct hypoglycemia).  - I have approached patient with the following individualized plan to manage diabetes and patient agrees.  - He has done reasonably very well on premixed Novolin 70/30. He wishes to stay on the same regimen for cost reasons.   - Given persistent hyperglycemia since discontinuation of Metformin, he will tolerate increase in his Novolin 70/30 to 55 units with breakfast and 45 units with supper, as long as his blood glucose is above 90 and he is eating.  -He is advised to continue monitoring blood glucose at least 3 times per day, before injecting insulin at breakfast and supper, and before bed.  He is instructed to call the clinic if he has readings less than 70 or greater than 300 for 3 tests in a row.  -He is warned not to take insulin without proper monitoring per orders.  -His kidney function had worsened since last visit, thus not a candidate to restart Metformin.   - Patient specific target  for A1c; LDL, HDL, Triglycerides, and  Waist Circumference were discussed in detail.  2) BP/HTN:  His blood pressure is not controlled to target.  He is advised to continue Metoprolol 25 mg po daily, Isosorbide 30 mg po daily, and Spironolactone 25 mg po daily.  3) Lipids/HPL:  His most recent lipid panel from 10/11/19 shows controlled LDL of 67 and elevated triglycerides of 283.  He is advised to continue Pravastatin 40 mg po daily at bedtime.  Side effects and precautions discussed with him.  He is advised to avoid fried foods and butter.  Will recheck lipid panel prior to next visit.  4)  Weight/Diet: His Body mass index is 37.06 kg/m.-  He is a candidate for modest weight loss.  He cannot exercise optimally, limited by pulmonary  sufficiency due to COPD and previous CVA.   CDE consult in progress, exercise, and carbohydrates information provided.  5) Hypothyroidism:  -  His previsit thyroid function tests are consistent with appropriate hormone replacement.  He is advised to continue Levothyroxine 100 mcg po daily before breakfast.   - We discussed about the correct intake of his thyroid hormone, on empty stomach at fasting, with water, separated by at least 30 minutes from breakfast and other medications,  and separated by more than 4 hours from calcium, iron, multivitamins, acid reflux medications (PPIs). -Patient is made aware of the fact that thyroid hormone replacement is needed for life, dose to be adjusted by periodic monitoring of thyroid function tests.  6) Chronic Care/Health Maintenance: -Patient is on Statin medications and encouraged to continue to follow up with Ophthalmology, nephrology, Podiatrist at least yearly or according to recommendations, and advised to stay away from smoking. I have recommended yearly flu vaccine and pneumonia vaccination at least every 5 years;  and  sleep for at least 7 hours a day.  - I advised him to pick up compression stockings to wear on bilateral lower extremities during the times, to help with dependent edema on bilateral lower extremities.  Also recommended he return to see his Podiatrist to help with dry flaky skin, calluses, and toenail trim.   - I advised patient to maintain close follow up with Fayrene Helper, MD for primary care needs.  - Time spent on this patient care encounter:  30 min, of which > 50% was spent in  counseling and the rest reviewing his blood glucose logs , discussing his hypoglycemia and hyperglycemia episodes, reviewing his current and  previous labs / studies  ( including abstraction from other facilities) and medications  doses and developing a  long term treatment plan and documenting his care.   Please refer to Patient Instructions for Blood  Glucose Monitoring and Insulin/Medications Dosing Guide"  in media tab for additional information. Please  also refer to " Patient Self Inventory" in the Media  tab for reviewed elements of pertinent patient history.  [name] participated in the discussions, expressed understanding, and voiced agreement with the above plans.  All questions were answered to his satisfaction. he is encouraged to contact clinic should he have any questions or concerns prior to his return visit.   Follow up plan: -No follow-ups on file.  Rayetta Pigg, [facility] [address] [place], [place] [address] Phone: [phone] Fax: [phone]  -  This note was partially dictated with voice recognition software. Similar sounding words can be transcribed inadequately or may not  be corrected upon review.  06/27/2020, 9:31 AM

## 2020-07-02 ENCOUNTER — Other Ambulatory Visit: Payer: Self-pay

## 2020-07-02 ENCOUNTER — Encounter: Payer: Self-pay | Admitting: Family Medicine

## 2020-07-02 ENCOUNTER — Telehealth (INDEPENDENT_AMBULATORY_CARE_PROVIDER_SITE_OTHER): Payer: Medicare HMO | Admitting: Family Medicine

## 2020-07-02 VITALS — BP 156/62 | Ht 70.0 in | Wt 262.0 lb

## 2020-07-02 DIAGNOSIS — Z125 Encounter for screening for malignant neoplasm of prostate: Secondary | ICD-10-CM | POA: Diagnosis not present

## 2020-07-02 DIAGNOSIS — L408 Other psoriasis: Secondary | ICD-10-CM

## 2020-07-02 DIAGNOSIS — E782 Mixed hyperlipidemia: Secondary | ICD-10-CM | POA: Diagnosis not present

## 2020-07-02 DIAGNOSIS — R296 Repeated falls: Secondary | ICD-10-CM | POA: Diagnosis not present

## 2020-07-02 DIAGNOSIS — I10 Essential (primary) hypertension: Secondary | ICD-10-CM | POA: Diagnosis not present

## 2020-07-02 DIAGNOSIS — Z1159 Encounter for screening for other viral diseases: Secondary | ICD-10-CM | POA: Diagnosis not present

## 2020-07-02 DIAGNOSIS — E559 Vitamin D deficiency, unspecified: Secondary | ICD-10-CM

## 2020-07-02 DIAGNOSIS — E1165 Type 2 diabetes mellitus with hyperglycemia: Secondary | ICD-10-CM | POA: Diagnosis not present

## 2020-07-02 NOTE — Assessment & Plan Note (Signed)
[name] is reminded of the importance of commitment to daily physical activity for 30 minutes or more, as able and the need to limit carbohydrate intake to 30 to 60 grams per meal to help with blood sugar control.   The need to take medication as prescribed, test blood sugar as directed, and to call between visits if there is a concern that blood sugar is uncontrolled is also discussed.   [name] is reminded of the importance of daily foot exam, annual eye examination, and good blood sugar, blood pressure and cholesterol control.  Diabetic Labs Latest Ref Rng & Units 06/20/2020 02/22/2020 02/16/2020 10/11/2019 09/04/2019  HbA1c 4.8 - 5.6 % 8.3(H) 8.2(A) - - -  Microalbumin mg/dL - - - 6.4 -  Micro/Creat Ratio <30 mcg/mg creat - - - 66(H) -  Chol <200 mg/dL - - - 128 -  HDL > OR = 40 mg/dL - - - 24(L) -  Calc LDL mg/dL (calc) - - - 67 -  Triglycerides <150 mg/dL - - - 283(H) -  Creatinine 0.76 - 1.27 mg/dL 1.45(H) - 1.12 1.39(H) 1.25(H)   BP/Weight 07/02/2020 06/27/2020 03/01/2020 02/22/2020 11/10/2019 10/20/2019 8/82/8003  Systolic BP 491 791 505 697 948 016 553  Diastolic BP 62 77 84 83 60 60 63  Wt. (Lbs) 262 262 257.8 256.8 254 254.6 258.4  BMI 37.59 37.06 36.47 36.33 35.93 36.01 36.55   Foot/eye exam completion dates Latest Ref Rng & Units 07/12/2018 10/06/2017  Eye Exam No Retinopathy - -  Foot exam Order - - -  Foot Form Completion - Done Done

## 2020-07-02 NOTE — Patient Instructions (Addendum)
F/U in end August,flu vaccine at visit,  call if you need me sooner  Keep up great health habits  Fasting lipid, vit D , PSA, hepatitis C screen, CBC this week  You are referred for diabetic eye exam which is due, on [address], Dr Jorja Loa   Please be careful not to fall  It is important that you exercise regularly at least 30 minutes 5 times a week. If you develop chest pain, have severe difficulty breathing, or feel very tired, stop exercising immediately and seek medical attention   Think about what you will eat, plan ahead. Choose " clean, green, fresh or frozen" over canned, processed or packaged foods which are more sugary, salty and fatty. 70 to 75% of food eaten should be vegetables and fruit. Three meals at set times with snacks allowed between meals, but they must be fruit or vegetables. Aim to eat over a 12 hour period , example 7 am to 7 pm, and STOP after  your last meal of the day. Drink water,generally about 64 ounces per day, no other drink is as healthy. Fruit juice is best enjoyed in a healthy way, by EATING the fruit.  Thanks for choosing [facility], we consider it a privelige to serve you.

## 2020-07-02 NOTE — Progress Notes (Signed)
Virtual Visit via Telephone Note  I connected with [name] on 07/02/20 at  8:40 AM EST by telephone and verified that I am speaking with the correct person using two identifiers.  Location: Patient: home Provider: office   I discussed the limitations, risks, security and privacy concerns of performing an evaluation and management service by telephone and the availability of in person appointments. I also discussed with the patient that there may be a patient responsible charge related to this service. The patient expressed understanding and agreed to proceed.   History of Present Illness: States doing well, no pain, fell when bending over once in the past year Denies polyuria, polydipsia, blurred vision , or hypoglycemic episodes. No recent significant choking events   F/U chronic problems and address any new or current concerns. Review and update medications and allergies. Review recent lab and radiologic data . Update routine health maintainace. Review an encourage improved health habits to include nutrition, exercise and  sleep .  Denies recent fever or chills.States he feels very well, no pain, no fever, no fatigue Denies polyuria, polydipsia, blurred vision , or hypoglycemic episodes. Tests twice daily, bedtime 196, and am 188 Denies sinus pressure, nasal congestion, ear pain or sore throat. Denies chest congestion, productive cough or wheezing. Denies chest pains, palpitations and leg swelling Denies abdominal pain, nausea, vomiting,diarrhea or constipation.   Denies dysuria, frequency, hesitancy or incontinence. Chronic joint swelling and stiffness and  limitation in mobility. Denies headaches, seizures, numbness, or tingling. Denies depression, anxiety or insomnia. Denies skin break down or rash.     Observations/Objective: BP (!) 156/62   Ht 5\' 10"  (1.778 m)   Wt 262 lb (118.8 kg)   BMI 37.59 kg/m  Good communication with no confusion and intact  memory. Alert and oriented x 3 No signs of respiratory distress during speech    Assessment and Plan: Uncontrolled type 2 diabetes mellitus with hyperglycemia [facility]) Mr. Ferrara is reminded of the importance of commitment to daily physical activity for 30 minutes or more, as able and the need to limit carbohydrate intake to 30 to 60 grams per meal to help with blood sugar control.   The need to take medication as prescribed, test blood sugar as directed, and to call between visits if there is a concern that blood sugar is uncontrolled is also discussed.   Mr. Spirito is reminded of the importance of daily foot exam, annual eye examination, and good blood sugar, blood pressure and cholesterol control.  Diabetic Labs Latest Ref Rng & Units 06/20/2020 02/22/2020 02/16/2020 10/11/2019 09/04/2019  HbA1c 4.8 - 5.6 % 8.3(H) 8.2(A) - - -  Microalbumin mg/dL - - - 6.4 -  Micro/Creat Ratio <30 mcg/mg creat - - - 66(H) -  Chol <200 mg/dL - - - 128 -  HDL > OR = 40 mg/dL - - - 24(L) -  Calc LDL mg/dL (calc) - - - 67 -  Triglycerides <150 mg/dL - - - 283(H) -  Creatinine 0.76 - 1.27 mg/dL 1.45(H) - 1.12 1.39(H) 1.25(H)   BP/Weight 07/02/2020 06/27/2020 03/01/2020 02/22/2020 11/10/2019 10/20/2019 3/41/9622  Systolic BP 297 989 211 941 740 814 481  Diastolic BP 62 77 84 83 60 60 63  Wt. (Lbs) 262 262 257.8 256.8 254 254.6 258.4  BMI 37.59 37.06 36.47 36.33 35.93 36.01 36.55   Foot/eye exam completion dates Latest Ref Rng & Units 07/12/2018 10/06/2017  Eye Exam No Retinopathy - -  Foot exam Order - - -  Foot Form Completion - Done Done        Vitamin d deficiency Updated lab needed at/ before next visit.   Recurrent falls Home safety discussed and advised against bending forward  Morbid obesity ([place])  Patient re-educated about  the importance of commitment to a  minimum of 150 minutes of exercise per week as able.  The importance of healthy food choices with portion control discussed, as well as  eating regularly and within a 12 hour window most days. The need to choose "clean , green" food 50 to 75% of the time is discussed, as well as to make water the primary drink and set a goal of 64 ounces water daily.    Weight /BMI 07/02/2020 06/27/2020 03/01/2020  WEIGHT 262 lb 262 lb 257 lb 12.8 oz  HEIGHT 5\' 10"  5' 10.5" 5' 10.5"  BMI 37.59 kg/m2 37.06 kg/m2 36.47 kg/m2      PSORIASIS Denies current flare  Essential hypertension DASH diet and commitment to daily physical activity for a minimum of 30 minutes discussed and encouraged, as a part of hypertension management. The importance of attaining a healthy weight is also discussed.  BP/Weight 07/02/2020 06/27/2020 03/01/2020 02/22/2020 11/10/2019 10/20/2019 07/08/2540  Systolic BP 706 237 628 315 176 160 737  Diastolic BP 62 77 84 83 60 60 63  Wt. (Lbs) 262 262 257.8 256.8 254 254.6 258.4  BMI 37.59 37.06 36.47 36.33 35.93 36.01 36.55   Elevated and not at goal, cardiology to adjust meds    Mixed hyperlipidemia Hyperlipidemia:Low fat diet discussed and encouraged.   Lipid Panel  Lab Results  Component Value Date   CHOL 128 10/11/2019   HDL 24 (L) 10/11/2019   LDLCALC 67 10/11/2019   LDLDIRECT 50 08/18/2014   TRIG 283 (H) 10/11/2019   CHOLHDL 5.3 (H) 10/11/2019   Updated lab needed at/ before next visit.     Gout Uric acid level needed    Follow Up Instructions:    I discussed the assessment and treatment plan with the patient. The patient was provided an opportunity to ask questions and all were answered. The patient agreed with the plan and demonstrated an understanding of the instructions.   The patient was advised to call back or seek an in-person evaluation if the symptoms worsen or if the condition fails to improve as anticipated.  I provided 24 minutes of non-face-to-face time during this encounter.   Tula Nakayama, MD

## 2020-07-03 ENCOUNTER — Encounter: Payer: Self-pay | Admitting: Family Medicine

## 2020-07-03 ENCOUNTER — Other Ambulatory Visit: Payer: Self-pay

## 2020-07-03 DIAGNOSIS — Z1159 Encounter for screening for other viral diseases: Secondary | ICD-10-CM | POA: Diagnosis not present

## 2020-07-03 DIAGNOSIS — Z125 Encounter for screening for malignant neoplasm of prostate: Secondary | ICD-10-CM

## 2020-07-03 DIAGNOSIS — M109 Gout, unspecified: Secondary | ICD-10-CM

## 2020-07-03 DIAGNOSIS — E782 Mixed hyperlipidemia: Secondary | ICD-10-CM

## 2020-07-03 DIAGNOSIS — I10 Essential (primary) hypertension: Secondary | ICD-10-CM | POA: Diagnosis not present

## 2020-07-03 DIAGNOSIS — E559 Vitamin D deficiency, unspecified: Secondary | ICD-10-CM

## 2020-07-03 DIAGNOSIS — E1165 Type 2 diabetes mellitus with hyperglycemia: Secondary | ICD-10-CM

## 2020-07-03 NOTE — Assessment & Plan Note (Signed)
  Patient re-educated about  the importance of commitment to a  minimum of 150 minutes of exercise per week as able.  The importance of healthy food choices with portion control discussed, as well as eating regularly and within a 12 hour window most days. The need to choose "clean , green" food 50 to 75% of the time is discussed, as well as to make water the primary drink and set a goal of 64 ounces water daily.    Weight /BMI 07/02/2020 06/27/2020 03/01/2020  WEIGHT 262 lb 262 lb 257 lb 12.8 oz  HEIGHT 5\' 10"  5' 10.5" 5' 10.5"  BMI 37.59 kg/m2 37.06 kg/m2 36.47 kg/m2

## 2020-07-03 NOTE — Assessment & Plan Note (Signed)
Hyperlipidemia:Low fat diet discussed and encouraged.   Lipid Panel  Lab Results  Component Value Date   CHOL 128 10/11/2019   HDL 24 (L) 10/11/2019   LDLCALC 67 10/11/2019   LDLDIRECT 50 08/18/2014   TRIG 283 (H) 10/11/2019   CHOLHDL 5.3 (H) 10/11/2019   Updated lab needed at/ before next visit.

## 2020-07-03 NOTE — Assessment & Plan Note (Signed)
DASH diet and commitment to daily physical activity for a minimum of 30 minutes discussed and encouraged, as a part of hypertension management. The importance of attaining a healthy weight is also discussed.  BP/Weight 07/02/2020 06/27/2020 03/01/2020 02/22/2020 11/10/2019 10/20/2019 4/74/2595  Systolic BP 638 756 433 295 188 416 606  Diastolic BP 62 77 84 83 60 60 63  Wt. (Lbs) 262 262 257.8 256.8 254 254.6 258.4  BMI 37.59 37.06 36.47 36.33 35.93 36.01 36.55   Elevated and not at goal, cardiology to adjust meds

## 2020-07-03 NOTE — Assessment & Plan Note (Signed)
Updated lab needed at/ before next visit.   

## 2020-07-03 NOTE — Assessment & Plan Note (Signed)
Denies current flare

## 2020-07-03 NOTE — Assessment & Plan Note (Signed)
Uric acid level needed

## 2020-07-03 NOTE — Assessment & Plan Note (Signed)
Home safety discussed and advised against bending forward

## 2020-07-04 ENCOUNTER — Other Ambulatory Visit: Payer: Self-pay | Admitting: Family Medicine

## 2020-07-04 DIAGNOSIS — I2510 Atherosclerotic heart disease of native coronary artery without angina pectoris: Secondary | ICD-10-CM

## 2020-07-04 LAB — VITAMIN D 25 HYDROXY (VIT D DEFICIENCY, FRACTURES): Vit D, 25-Hydroxy: 22.6 ng/mL — ABNORMAL LOW (ref 30.0–100.0)

## 2020-07-04 LAB — PSA: Prostate Specific Ag, Serum: 0.7 ng/mL (ref 0.0–4.0)

## 2020-07-04 LAB — LIPID PANEL
Chol/HDL Ratio: 4.5 ratio (ref 0.0–5.0)
Cholesterol, Total: 113 mg/dL (ref 100–199)
HDL: 25 mg/dL — ABNORMAL LOW (ref >39)
LDL Chol Calc (NIH): 55 mg/dL (ref 0–99)
Triglycerides: 202 mg/dL — ABNORMAL HIGH (ref 0–149)
VLDL Cholesterol Cal: 33 mg/dL (ref 5–40)

## 2020-07-04 LAB — CBC
Hematocrit: 34.6 % — ABNORMAL LOW (ref 37.5–51.0)
Hemoglobin: 11.2 g/dL — ABNORMAL LOW (ref 13.0–17.7)
MCH: 27.7 pg (ref 26.6–33.0)
MCHC: 32.4 g/dL (ref 31.5–35.7)
MCV: 85 fL (ref 79–97)
Platelets: 243 10*3/uL (ref 150–450)
RBC: 4.05 x10E6/uL — ABNORMAL LOW (ref 4.14–5.80)
RDW: 14.6 % (ref 11.6–15.4)
WBC: 10.2 10*3/uL (ref 3.4–10.8)

## 2020-07-04 LAB — HEPATITIS C ANTIBODY: Hep C Virus Ab: <0.1 s/co ratio (ref 0.0–0.9)

## 2020-07-11 ENCOUNTER — Other Ambulatory Visit: Payer: Self-pay | Admitting: Nurse Practitioner

## 2020-07-23 ENCOUNTER — Other Ambulatory Visit: Payer: Self-pay | Admitting: Family Medicine

## 2020-08-01 DIAGNOSIS — E559 Vitamin D deficiency, unspecified: Secondary | ICD-10-CM | POA: Diagnosis not present

## 2020-08-01 DIAGNOSIS — M109 Gout, unspecified: Secondary | ICD-10-CM | POA: Diagnosis not present

## 2020-08-01 DIAGNOSIS — I10 Essential (primary) hypertension: Secondary | ICD-10-CM | POA: Diagnosis not present

## 2020-08-01 DIAGNOSIS — I25118 Atherosclerotic heart disease of native coronary artery with other forms of angina pectoris: Secondary | ICD-10-CM | POA: Diagnosis not present

## 2020-08-01 DIAGNOSIS — E119 Type 2 diabetes mellitus without complications: Secondary | ICD-10-CM | POA: Diagnosis not present

## 2020-08-01 DIAGNOSIS — E785 Hyperlipidemia, unspecified: Secondary | ICD-10-CM | POA: Diagnosis not present

## 2020-08-01 DIAGNOSIS — I5030 Unspecified diastolic (congestive) heart failure: Secondary | ICD-10-CM | POA: Diagnosis not present

## 2020-08-01 DIAGNOSIS — I639 Cerebral infarction, unspecified: Secondary | ICD-10-CM | POA: Diagnosis not present

## 2020-08-04 ENCOUNTER — Other Ambulatory Visit: Payer: Self-pay | Admitting: Family Medicine

## 2020-08-08 ENCOUNTER — Other Ambulatory Visit: Payer: Self-pay

## 2020-08-08 ENCOUNTER — Ambulatory Visit (INDEPENDENT_AMBULATORY_CARE_PROVIDER_SITE_OTHER): Payer: Medicare HMO

## 2020-08-08 VITALS — Ht 70.0 in | Wt 253.0 lb

## 2020-08-08 DIAGNOSIS — Z0000 Encounter for general adult medical examination without abnormal findings: Secondary | ICD-10-CM | POA: Diagnosis not present

## 2020-08-08 NOTE — Progress Notes (Signed)
Subjective:   [name] is a 80 y.o. male who presents for Medicare Annual/Subsequent preventive examination.  Review of Systems     Cardiac Risk Factors include: advanced age (>47men, >73 women);diabetes mellitus;dyslipidemia;male gender;obesity (BMI >30kg/m2);sedentary lifestyle;smoking/ tobacco exposure     Objective:    Today's Vitals   08/08/20 0901 08/08/20 0902 08/08/20 0905  Weight: 253 lb (114.8 kg)    Height: 5\' 10"  (1.778 m)    PainSc:  0-No pain 0-No pain   Body mass index is 36.3 kg/m.  Advanced Directives 09/04/2019 09/01/2019 08/31/2019 08/28/2019 08/27/2019 08/26/2019 07/26/2018  Does Patient Have a Medical Advance Directive? No No No No No No No  Does patient want to make changes to medical advance directive? - - - - - - -  Would patient like information on creating a medical advance directive? - No - Patient declined - No - Patient declined No - Patient declined - No - Patient declined  Pre-existing out of facility DNR order (yellow form or pink MOST form) - - - - - - -    Current Medications (verified) Outpatient Encounter Medications as of 08/08/2020  Medication Sig  . ACCU-CHEK AVIVA PLUS test strip TEST FOUR TIMES DAILY  . acetaminophen (TYLENOL) 500 MG tablet Take 1,000 mg by mouth every 6 (six) hours as needed for moderate pain (left thigh).   Marland Kitchen albuterol (VENTOLIN HFA) 108 (90 Base) MCG/ACT inhaler Inhale 1-2 puffs into the lungs every 6 (six) hours as needed for wheezing or shortness of breath.  . allopurinol (ZYLOPRIM) 300 MG tablet TAKE 1 TABLET (300 MG TOTAL) BY MOUTH DAILY.  Marland Kitchen aspirin EC 81 MG tablet Take 81 mg by mouth daily.  . Blood Glucose Calibration (TRUE METRIX LEVEL 1) Low SOLN Use as directed  . budesonide-formoterol (SYMBICORT) 80-4.5 MCG/ACT inhaler Inhale 2 puffs into the lungs daily.  . clopidogrel (PLAVIX) 75 MG tablet TAKE 1 TABLET EVERY DAY  . DROPLET INSULIN SYRINGE 31G X 5/16" 1 ML MISC USE AS DIRECTED IN THE MORNING AND AT BEDTIME   . insulin NPH-regular Human (NOVOLIN 70/30) (70-30) 100 UNIT/ML injection INJECT 55 UNITS SUBCUTANEOUSLY WITH BREAKFAST AND 45 UNITS WITH SUPPER  . isosorbide mononitrate (IMDUR) 30 MG 24 hr tablet Take 1 tablet (30 mg total) by mouth daily.  Marland Kitchen levETIRAcetam (KEPPRA) 500 MG tablet TAKE 1 TABLET TWICE DAILY  . levothyroxine (SYNTHROID) 100 MCG tablet TAKE 1 TABLET (100 MCG TOTAL) BY MOUTH DAILY BEFORE BREAKFAST.  . metoprolol tartrate (LOPRESSOR) 50 MG tablet TAKE 1/2 TABLET TWICE DAILY  . pantoprazole (PROTONIX) 40 MG tablet TAKE 1 TABLET EVERY DAY BEFORE BREAKFAST  . polyethylene glycol (MIRALAX / GLYCOLAX) 17 g packet Take 17 g by mouth daily as needed.  . pravastatin (PRAVACHOL) 40 MG tablet TAKE 2 TABLETS EVERY DAY  . spironolactone (ALDACTONE) 25 MG tablet TAKE 1 TABLET EVERY DAY  . TRUEplus Lancets 33G MISC TEST BLOOD SUGAR FOUR TIMES DAILY  . vitamin B-12 (CYANOCOBALAMIN) 1000 MCG tablet Take 1,000 mcg by mouth daily.  . nitroGLYCERIN (NITROSTAT) 0.4 MG SL tablet Place 1 tablet (0.4 mg total) under the tongue every 5 (five) minutes as needed for chest pain.   No facility-administered encounter medications on file as of 08/08/2020.    Allergies (verified) Patient has no known allergies.   History: Past Medical History:  Diagnosis Date  . Abnormality of gait 05/01/2015  . Anemia 04/19/2015  . Annual physical exam 12/11/2013  . Arthritis    "left leg; knees" (  05/07/2017)  . Arthritis of knee   . CAD (coronary artery disease)   . CHF (congestive heart failure) ([place])    Phreesia 08/08/2020  . Choking 07/13/2015  . Chronic bronchitis ([place])    "get it q yr"  . Chronic lower back pain    "since I was a teen" (05/07/2017)  . COPD (chronic obstructive pulmonary disease) ([place])   . CVA (cerebral vascular accident) ([place]) 05/2015   R sided weakness (05/07/2017)  . Diabetes mellitus without complication ([place])    Phreesia 08/08/2020  . GERD (gastroesophageal reflux disease)   . Gout    . History of colonic polyps 04/03/2017   Added automatically from request for surgery [id number]  . Hyperlipidemia   . Hypertension   . Myocardial infarction ([place]) 1999  . Obesity   . OSA on CPAP   . Oxygen deficiency    Phreesia 08/08/2020  . Pneumonia    "a few times; last time was ~ 06/2013" (05/07/2017)  . Psoriasis   . Stroke [facility])    Phreesia 08/08/2020  . Thyroid disease    Phreesia 08/08/2020  . Tussive syncope 05/01/2015  . Type II diabetes mellitus ([place])   . Vitamin B12 deficiency 05/01/2015   Past Surgical History:  Procedure Laterality Date  . APPENDECTOMY    . CATARACT EXTRACTION W/PHACO Left 10/07/2016   Procedure: CATARACT EXTRACTION PHACO AND INTRAOCULAR LENS PLACEMENT (IOC);  Surgeon: Rutherford Guys, MD;  Location: [facility] ORS;  Service: Ophthalmology;  Laterality: Left;  CDE: 13.49  . CATARACT EXTRACTION W/PHACO Right 10/28/2016   Procedure: CATARACT EXTRACTION PHACO AND INTRAOCULAR LENS PLACEMENT (IOC);  Surgeon: Rutherford Guys, MD;  Location: [facility] ORS;  Service: Ophthalmology;  Laterality: Right;  CDE: 16.71  . COLONOSCOPY  03/17/2012   Procedure: COLONOSCOPY;  Surgeon: Rogene Houston, MD;  Location: [facility] ENDO SU[facility];  Service: Endoscopy;  Laterality: N/A;  830  . COLONOSCOPY N/A 06/25/2017   Procedure: COLONOSCOPY;  Surgeon: Rogene Houston, MD;  Location: [facility] END[facility];  Service: Endoscopy;  Laterality: N/A;  [place]- 2009-08/30/2013   "counting today's, I have 5 stents" (08/30/2013)  . CORONARY ANGIOPLASTY WITH STENT PLACEMENT  05/07/2017  . CORONARY STENT INTERVENTION N/A 05/07/2017   Procedure: CORONARY STENT INTERVENTION;  Surgeon: Charolette Forward, MD;  Location: [place] CV LAB;  Service: Cardiovascular;  Laterality: N/A;  . CYSTOSCOPY WITH URETHRAL DILATATION N/A 11/26/2012   Procedure: CYSTOSCOPY WITH URETHRAL DILATATION;  Surgeon: Malka So, MD;  Location: [facility];  Service: Urology;  Laterality: N/A;  . LEFT HEART  CATH AND CORONARY ANGIOGRAPHY N/A 05/07/2017   Procedure: LEFT HEART CATH AND CORONARY ANGIOGRAPHY;  Surgeon: Charolette Forward, MD;  Location: [place] CV LAB;  Service: Cardiovascular;  Laterality: N/A;  . LEFT HEART CATHETERIZATION WITH CORONARY ANGIOGRAM N/A 08/30/2013   Procedure: LEFT HEART CATHETERIZATION WITH CORONARY ANGIOGRAM;  Surgeon: Clent Demark, MD;  Location: [place] CATH LAB;  Service: Cardiovascular;  Laterality: N/A;  . PERCUTANEOUS CORONARY STENT INTERVENTION (PCI-S) Right 08/30/2013   Procedure: PERCUTANEOUS CORONARY STENT INTERVENTION (PCI-S);  Surgeon: Clent Demark, MD;  Location: [facility] CATH LAB;  Service: Cardiovascular;  Laterality: Right;   Family History  Problem Relation Age of Onset  . Hypertension Mother   . Diabetes Mother   . Hypertension Father   . Diabetes Brother   . Stroke Daughter   . Arthritis Other        Family History   . Diabetes Other  family History    Social History   Socioeconomic History  . Marital status: Married    Spouse name: Not on file  . Number of children: 2  . Years of education: 53  . Highest education level: 12th grade  Occupational History  . Occupation: Brewing technologist     Comment: retired   Tobacco Use  . Smoking status: Former Smoker    Packs/day: 0.50    Years: 33.00    Pack years: 16.50    Types: Cigarettes    Quit date: 11/29/2002    Years since quitting: 17.7  . Smokeless tobacco: Never Used  Vaping Use  . Vaping Use: Never used  Substance and Sexual Activity  . Alcohol use: No  . Drug use: No  . Sexual activity: Not Currently  Other Topics Concern  . Not on file  Social History Narrative   Patient drinks about 3 cups of soda daily.    Patient is left handed.    Lives alone with wife    Social Determinants of Health   Financial Resource Strain: Low Risk   . Difficulty of Paying Living Expenses: Not hard at all  Food Insecurity: Not on file  Transportation Needs: No Transportation Needs  .  Lack of Transportation (Medical): No  . Lack of Transportation (Non-Medical): No  Physical Activity: Inactive  . Days of Exercise per Week: 0 days  . Minutes of Exercise per Session: 0 min  Stress: Not on file  Social Connections: Moderately Integrated  . Frequency of Communication with Friends and Family: Three times a week  . Frequency of Social Gatherings with Friends and Family: Three times a week  . Attends Religious Services: 1 to 4 times per year  . Active Member of Clubs or Organizations: No  . Attends Archivist Meetings: Never  . Marital Status: Married    Tobacco Counseling Counseling given: Not Answered   Clinical Intake:  Pre-visit preparation completed: Yes  Pain : No/denies pain Pain Score: 0-No pain     Nutritional Status: BMI > 30  Obese Diabetes: Yes CBG done?: No CBG resulted in Enter/ Edit results?: No Did pt. bring in CBG monitor from home?: No  How often do you need to have someone help you when you read instructions, pamphlets, or other written materials from your doctor or pharmacy?: 2 - Rarely What is the last grade level you completed in school?: 11th grade  Diabetic? yes  Interpreter Needed?: No      Activities of Daily Living In your present state of health, do you have any difficulty performing the following activities: 08/08/2020 09/03/2019  Hearing? [name]  Vision? N N  Difficulty concentrating or making decisions? N Y  Walking or climbing stairs? Y Y  Dressing or bathing? N N  Doing errands, shopping? [place] and eating ? N -  Using the Toilet? N -  In the past six months, have you accidently leaked urine? N -  Do you have problems with loss of bowel control? N -  Managing your Medications? N -  Managing your Finances? N -  Housekeeping or managing your Housekeeping? N -  Some recent data might be hidden    Patient Care Team: Fayrene Helper, MD as PCP - General Sinda Du, MD as  Consulting Physician (Pulmonary Disease) Charolette Forward, MD as Consulting Physician (Cardiology) Rutherford Guys, MD as Consulting Physician (Ophthalmology) Kathrynn Ducking, MD as Consulting Physician (  Neurology)  Indicate any recent Medical Services you may have received from other than [facility] providers in the past year (date may be approximate).     Assessment:   This is a routine wellness examination for [name].  Hearing/Vision screen No exam data present  Dietary issues and exercise activities discussed: Current Exercise Habits: The patient does not participate in regular exercise at present, Exercise limited by: respiratory conditions(s)  Goals    .  Exercise 3x per week (30 min per time)      Recommend starting chair exercises 3 times a week for at least 30 minutes at a time as tolerated.    .  Patient Stated      Prevent falls     .  water (pt-stated)      Increase water intake       Depression Screen PHQ 2/9 Scores 08/08/2020 07/02/2020 03/01/2020 11/10/2019 09/06/2019 07/07/2019 07/26/2018  PHQ - 2 Score 0 0 0 0 0 0 0    Fall Risk Fall Risk  08/08/2020 07/02/2020 07/02/2020 03/01/2020 09/06/2019  Falls in the past year? 1 1 0 0 1  Comment - - - - -  Number falls in past yr: 0 0 0 0 0  Comment - [place] a package - - -  Injury with Fall? 0 0 0 0 0  Comment - - - - -  Risk Factor Category  - - - - -  Risk for fall due to : - - - No Fall Risks -  Risk for fall due to: Comment - - - - -  Follow up - - - Falls evaluation completed Falls evaluation completed    [place]:  Any stairs in or around the home? No  If so, are there any without handrails? No  Home free of loose throw rugs in walkways, pet beds, electrical cords, etc? Yes  Adequate lighting in your home to reduce risk of falls? Yes   ASSISTIVE DEVICES UTILIZED TO PREVENT FALLS:  Life alert? No  Use of a cane, walker or w/c? Yes   Grab bars in the bathroom? Yes  Shower chair or bench in shower? Yes  Elevated toilet seat or a handicapped toilet? No   TIMED UP AND GO:  Was the test performed? No .  Length of time to ambulate 10 feet:  sec.     Cognitive Function: MMSE - Mini Mental State Exam 05/01/2015  Orientation to time 5  Orientation to Place 4  Registration 3  Attention/ Calculation 5  Recall 2  Language- name 2 objects 2  Language- repeat 1  Language- follow 3 step command 3  Language- read & follow direction 1  Write a sentence 1  Copy design 0  Total score 27     6CIT Screen 08/08/2020 08/08/2019 07/26/2018 06/29/2017 07/01/2016  What Year? 0 points 0 points 0 points 0 points 0 points  What month? 0 points 0 points 0 points 0 points 0 points  What time? 0 points 0 points 0 points 0 points 0 points  Count back from 20 0 points 0 points 0 points 0 points 0 points  Months in reverse 0 points 0 points 0 points 0 points 0 points  Repeat phrase 0 points 0 points 2 points 0 points 0 points  Total Score 0 0 2 0 0    Immunizations Immunization History  Administered Date(s) Administered  . Fluad Quad(high  Dose 65+) 01/31/2019, 03/01/2020  . Influenza Whole 03/18/2005  . Influenza, High Dose Seasonal PF 02/22/2018  . Influenza,inj,Quad PF,6+ Mos 03/19/2017  . PFIZER(Purple Top)SARS-COV-2 Vaccination 06/25/2019, 07/16/2019, 04/22/2020  . Pneumococcal Conjugate-13 12/08/2013  . Pneumococcal Polysaccharide-23 06/09/2012  . Td 12/04/2003  . Tdap 03/06/2017    TDAP status: Up to date  Flu Vaccine status: Up to date  Pneumococcal vaccine status: Up to date  Covid-19 vaccine status: Completed vaccines  Qualifies for Shingles Vaccine? Yes   Zostavax completed No   Shingrix Completed?: No.    Education has been provided regarding the importance of this vaccine. Patient has been advised to call insurance company to determine out of pocket expense if they have not yet received this vaccine. Advised  may also receive vaccine at local pharmacy or Health Dept. Verbalized acceptance and understanding.  Screening Tests Health Maintenance  Topic Date Due  . OPHTHALMOLOGY EXAM  06/23/2020  . URINE MICROALBUMIN  10/10/2020  . HEMOGLOBIN A1C  12/18/2020  . FOOT EXAM  06/27/2021  . TETANUS/TDAP  03/07/2027  . INFLUENZA VACCINE  Completed  . COVID-19 Vaccine  Completed  . Hepatitis C Screening  Completed  . PNA vac Low Risk Adult  Completed  . HPV VACCINES  Aged Out    Health Maintenance  Health Maintenance Due  Topic Date Due  . OPHTHALMOLOGY EXAM  06/23/2020    Colorectal cancer screening: No longer required.  last done in 2019  Lung Cancer Screening: (Low Dose CT Chest recommended if Age 32-80 years, 30 pack-year currently smoking OR have quit w/in 15years.) does not qualify.   Lung Cancer Screening Referral:  Doesn't qualify   Additional Screening:  Hepatitis C Screening: does qualify; lab tested ordered to be done at next lab draw  Vision Screening: Recommended annual ophthalmology exams for early detection of glaucoma and other disorders of the eye. Is the patient up to date with their annual eye exam?  Yes  Who is the provider or what is the name of the office in which the patient attends annual eye exams? [email or web address] If pt is not established with a provider, would they like to be referred to a provider to establish care? No .   Dental Screening: Recommended annual dental exams for proper oral hygiene  Community Resource Referral / Chronic Care Management: CRR required this visit?  No   CCM required this visit?  No      Plan:     I have personally reviewed and noted the following in the patient's chart:   . Medical and social history . Use of alcohol, tobacco or illicit drugs  . Current medications and supplements . Functional ability and status . Nutritional status . Physical activity . Advanced directives . List of other physicians . Hospitalizations,  surgeries, and ER visits in previous 12 months . Vitals . Screenings to include cognitive, depression, and falls . Referrals and appointments  In addition, I have reviewed and discussed with patient certain preventive protocols, quality metrics, and best practice recommendations. A written personalized care plan for preventive services as well as general preventive health recommendations were provided to patient.     Kate Sable, LPN, LPN   09/04/6220   Nurse Notes: Visit performed by telephone. Patient at home and supervising provider in the office. Patient consents to telephone visit. Time spent with pt 25 mins.

## 2020-08-08 NOTE — Patient Instructions (Addendum)
Mr. [name] , Thank you for taking time to come for your Medicare Wellness Visit. I appreciate your ongoing commitment to your health goals. Please review the following plan we discussed and let me know if I can assist you in the future.   I have contacted [facility] to come collect the oxygen tank.  Screening recommendations/referrals: Colonoscopy: Done in 2019- no further screening required Recommended yearly ophthalmology/optometry visit for glaucoma screening and checkup Recommended yearly dental visit for hygiene and checkup  Vaccinations: Influenza vaccine: up to date  Pneumococcal vaccine: up to date Tdap vaccine: up to date  Shingles vaccine: information enclosed      Conditions/risks identified: fall risk   Next appointment: August 09, 2021 9:00am  Preventive Care 81 Years and Older, Male Preventive care refers to lifestyle choices and visits with your health care provider that can promote health and wellness. What does preventive care include?  A yearly physical exam. This is also called an annual well check.  Dental exams once or twice a year.  Routine eye exams. Ask your health care provider how often you should have your eyes checked.  Personal lifestyle choices, including:  Daily care of your teeth and gums.  Regular physical activity.  Eating a healthy diet.  Avoiding tobacco and drug use.  Limiting alcohol use.  Practicing safe sex.  Taking low doses of aspirin every day.  Taking vitamin and mineral supplements as recommended by your health care provider. What happens during an annual well check? The services and screenings done by your health care provider during your annual well check will depend on your age, overall health, lifestyle risk factors, and family history of disease. Counseling  Your health care provider may ask you questions about your:  Alcohol use.  Tobacco use.  Drug use.  Emotional well-being.  Home and relationship  well-being.  Sexual activity.  Eating habits.  History of falls.  Memory and ability to understand (cognition).  Work and work Statistician. Screening  You may have the following tests or measurements:  Height, weight, and BMI.  Blood pressure.  Lipid and cholesterol levels. These may be checked every 5 years, or more frequently if you are over 34 years old.  Skin check.  Lung cancer screening. You may have this screening every year starting at age 50 if you have a 30-pack-year history of smoking and currently smoke or have quit within the past 15 years.  Fecal occult blood test (FOBT) of the stool. You may have this test every year starting at age 79.  Flexible sigmoidoscopy or colonoscopy. You may have a sigmoidoscopy every 5 years or a colonoscopy every 10 years starting at age 35.  Prostate cancer screening. Recommendations will vary depending on your family history and other risks.  Hepatitis C blood test.  Hepatitis B blood test.  Sexually transmitted disease (STD) testing.  Diabetes screening. This is done by checking your blood sugar (glucose) after you have not eaten for a while (fasting). You may have this done every 1-3 years.  Abdominal aortic aneurysm (AAA) screening. You may need this if you are a current or former smoker.  Osteoporosis. You may be screened starting at age 43 if you are at high risk. Talk with your health care provider about your test results, treatment options, and if necessary, the need for more tests. Vaccines  Your health care provider may recommend certain vaccines, such as:  Influenza vaccine. This is recommended every year.  Tetanus, diphtheria, and acellular pertussis (Tdap,  Td) vaccine. You may need a Td booster every 10 years.  Zoster vaccine. You may need this after age 61.  Pneumococcal 13-valent conjugate (PCV13) vaccine. One dose is recommended after age 74.  Pneumococcal polysaccharide (PPSV23) vaccine. One dose is  recommended after age 40. Talk to your health care provider about which screenings and vaccines you need and how often you need them. This information is not intended to replace advice given to you by your health care provider. Make sure you discuss any questions you have with your health care provider. Document Released: 06/01/2015 Document Revised: 01/23/2016 Document Reviewed: 03/06/2015 Elsevier Interactive Patient Education  2017 [place] Prevention in the Home Falls can cause injuries. They can happen to people of all ages. There are many things you can do to make your home safe and to help prevent falls. What can I do on the outside of my home?  Regularly fix the edges of walkways and driveways and fix any cracks.  Remove anything that might make you trip as you walk through a door, such as a raised step or threshold.  Trim any bushes or trees on the path to your home.  Use bright outdoor lighting.  Clear any walking paths of anything that might make someone trip, such as rocks or tools.  Regularly check to see if handrails are loose or broken. Make sure that both sides of any steps have handrails.  Any raised decks and porches should have guardrails on the edges.  Have any leaves, snow, or ice cleared regularly.  Use sand or salt on walking paths during winter.  Clean up any spills in your garage right away. This includes oil or grease spills. What can I do in the bathroom?  Use night lights.  Install grab bars by the toilet and in the tub and shower. Do not use towel bars as grab bars.  Use non-skid mats or decals in the tub or shower.  If you need to sit down in the shower, use a plastic, non-slip stool.  Keep the floor dry. Clean up any water that spills on the floor as soon as it happens.  Remove soap buildup in the tub or shower regularly.  Attach bath mats securely with double-sided non-slip rug tape.  Do not have throw rugs and other things on  the floor that can make you trip. What can I do in the bedroom?  Use night lights.  Make sure that you have a light by your bed that is easy to reach.  Do not use any sheets or blankets that are too big for your bed. They should not hang down onto the floor.  Have a firm chair that has side arms. You can use this for support while you get dressed.  Do not have throw rugs and other things on the floor that can make you trip. What can I do in the kitchen?  Clean up any spills right away.  Avoid walking on wet floors.  Keep items that you use a lot in easy-to-reach places.  If you need to reach something above you, use a strong step stool that has a grab bar.  Keep electrical cords out of the way.  Do not use floor polish or wax that makes floors slippery. If you must use wax, use non-skid floor wax.  Do not have throw rugs and other things on the floor that can make you trip. What can I do with my stairs?  Do not  leave any items on the stairs.  Make sure that there are handrails on both sides of the stairs and use them. Fix handrails that are broken or loose. Make sure that handrails are as long as the stairways.  Check any carpeting to make sure that it is firmly attached to the stairs. Fix any carpet that is loose or worn.  Avoid having throw rugs at the top or bottom of the stairs. If you do have throw rugs, attach them to the floor with carpet tape.  Make sure that you have a light switch at the top of the stairs and the bottom of the stairs. If you do not have them, ask someone to add them for you. What else can I do to help prevent falls?  Wear shoes that:  Do not have high heels.  Have rubber bottoms.  Are comfortable and fit you well.  Are closed at the toe. Do not wear sandals.  If you use a stepladder:  Make sure that it is fully opened. Do not climb a closed stepladder.  Make sure that both sides of the stepladder are locked into place.  Ask someone to  hold it for you, if possible.  Clearly mark and make sure that you can see:  Any grab bars or handrails.  First and last steps.  Where the edge of each step is.  Use tools that help you move around (mobility aids) if they are needed. These include:  Canes.  Walkers.  Scooters.  Crutches.  Turn on the lights when you go into a dark area. Replace any light bulbs as soon as they burn out.  Set up your furniture so you have a clear path. Avoid moving your furniture around.  If any of your floors are uneven, fix them.  If there are any pets around you, be aware of where they are.  Review your medicines with your doctor. Some medicines can make you feel dizzy. This can increase your chance of falling. Ask your doctor what other things that you can do to help prevent falls. This information is not intended to replace advice given to you by your health care provider. Make sure you discuss any questions you have with your health care provider. Document Released: 03/01/2009 Document Revised: 10/11/2015 Document Reviewed: 06/09/2014 Elsevier Interactive Patient Education  2017 [employer].

## 2020-08-18 ENCOUNTER — Other Ambulatory Visit: Payer: Self-pay | Admitting: Family Medicine

## 2020-08-26 ENCOUNTER — Other Ambulatory Visit: Payer: Self-pay | Admitting: Family Medicine

## 2020-09-14 DIAGNOSIS — E1165 Type 2 diabetes mellitus with hyperglycemia: Secondary | ICD-10-CM | POA: Diagnosis not present

## 2020-09-14 DIAGNOSIS — E559 Vitamin D deficiency, unspecified: Secondary | ICD-10-CM | POA: Diagnosis not present

## 2020-09-15 LAB — LIPID PANEL
Chol/HDL Ratio: 4.6 ratio (ref 0.0–5.0)
Cholesterol, Total: 110 mg/dL (ref 100–199)
HDL: 24 mg/dL — ABNORMAL LOW (ref >39)
LDL Chol Calc (NIH): 59 mg/dL (ref 0–99)
Triglycerides: 152 mg/dL — ABNORMAL HIGH (ref 0–149)
VLDL Cholesterol Cal: 27 mg/dL (ref 5–40)

## 2020-09-15 LAB — VITAMIN D 25 HYDROXY (VIT D DEFICIENCY, FRACTURES): Vit D, 25-Hydroxy: 25.3 ng/mL — ABNORMAL LOW (ref 30.0–100.0)

## 2020-09-17 ENCOUNTER — Other Ambulatory Visit: Payer: Self-pay | Admitting: Nurse Practitioner

## 2020-09-17 ENCOUNTER — Other Ambulatory Visit: Payer: Self-pay | Admitting: Family Medicine

## 2020-09-24 ENCOUNTER — Encounter: Payer: Self-pay | Admitting: Nurse Practitioner

## 2020-09-24 ENCOUNTER — Ambulatory Visit: Payer: Medicare HMO | Admitting: Nurse Practitioner

## 2020-09-24 ENCOUNTER — Other Ambulatory Visit: Payer: Self-pay

## 2020-09-24 VITALS — BP 171/78 | HR 78 | Ht 70.5 in | Wt 263.0 lb

## 2020-09-24 DIAGNOSIS — E559 Vitamin D deficiency, unspecified: Secondary | ICD-10-CM | POA: Diagnosis not present

## 2020-09-24 DIAGNOSIS — E038 Other specified hypothyroidism: Secondary | ICD-10-CM | POA: Diagnosis not present

## 2020-09-24 DIAGNOSIS — I10 Essential (primary) hypertension: Secondary | ICD-10-CM | POA: Diagnosis not present

## 2020-09-24 DIAGNOSIS — E782 Mixed hyperlipidemia: Secondary | ICD-10-CM

## 2020-09-24 DIAGNOSIS — E1165 Type 2 diabetes mellitus with hyperglycemia: Secondary | ICD-10-CM | POA: Diagnosis not present

## 2020-09-24 LAB — POCT GLYCOSYLATED HEMOGLOBIN (HGB A1C): Hemoglobin A1C: 7.5 % — AB (ref 4.0–5.6)

## 2020-09-24 NOTE — Patient Instructions (Signed)

## 2020-09-24 NOTE — Progress Notes (Signed)
09/24/2020  Endocrinology follow-up note  Subjective:    Patient ID: [name], male    DOB: August 06, 1940, PCP Fayrene Helper, MD   Past Medical History:  Diagnosis Date  . Abnormality of gait 05/01/2015  . Anemia 04/19/2015  . Annual physical exam 12/11/2013  . Arthritis    "left leg; knees" (05/07/2017)  . Arthritis of knee   . CAD (coronary artery disease)   . CHF (congestive heart failure) ([place])    Phreesia 08/08/2020  . Choking 07/13/2015  . Chronic bronchitis ([place])    "get it q yr"  . Chronic lower back pain    "since I was a teen" (05/07/2017)  . COPD (chronic obstructive pulmonary disease) ([place])   . CVA (cerebral vascular accident) ([place]) 05/2015   R sided weakness (05/07/2017)  . Diabetes mellitus without complication ([place])    Phreesia 08/08/2020  . GERD (gastroesophageal reflux disease)   . Gout   . History of colonic polyps 04/03/2017   Added automatically from request for surgery [id number]  . Hyperlipidemia   . Hypertension   . Myocardial infarction ([place]) 1999  . Obesity   . OSA on CPAP   . Oxygen deficiency    Phreesia 08/08/2020  . Pneumonia    "a few times; last time was ~ 06/2013" (05/07/2017)  . Psoriasis   . Stroke [facility])    Phreesia 08/08/2020  . Thyroid disease    Phreesia 08/08/2020  . Tussive syncope 05/01/2015  . Type II diabetes mellitus ([place])   . Vitamin B12 deficiency 05/01/2015   Past Surgical History:  Procedure Laterality Date  . APPENDECTOMY    . CATARACT EXTRACTION W/PHACO Left 10/07/2016   Procedure: CATARACT EXTRACTION PHACO AND INTRAOCULAR LENS PLACEMENT (IOC);  Surgeon: Rutherford Guys, MD;  Location: [facility] OR[facility];  Service: Ophthalmology;  Laterality: Left;  CDE: 13.49  . CATARACT EXTRACTION W/PHACO Right 10/28/2016   Procedure: CATARACT EXTRACTION PHACO AND INTRAOCULAR LENS PLACEMENT (IOC);  Surgeon: Rutherford Guys, MD;  Location: [facility];  Service: Ophthalmology;  Laterality: Right;  CDE: 16.71  . COLONOSCOPY  03/17/2012    Procedure: COLONOSCOPY;  Surgeon: Rogene Houston, MD;  Location: [facility];  Service: Endoscopy;  Laterality: N/A;  830  . COLONOSCOPY N/A 06/25/2017   Procedure: COLONOSCOPY;  Surgeon: Rogene Houston, MD;  Location[facility];  Service: Endoscopy;  Laterality: N/A;  [place]- 2009-08/30/2013   "counting today's, I have 5 stents" (08/30/2013)  . CORONARY ANGIOPLASTY WITH STENT PLACEMENT  05/07/2017  . CORONARY STENT INTERVENTION N/A 05/07/2017   Procedure: CORONARY STENT INTERVENTION;  Surgeon: Charolette Forward, MD;  Location: [facility] CV LAB;  Service: Cardiovascular;  Laterality: N/A;  . CYSTOSCOPY WITH URETHRAL DILATATION N/A 11/26/2012   Procedure: CYSTOSCOPY WITH URETHRAL DILATATION;  Surgeon: Malka So, MD;  Location: [facility];  Service: Urology;  Laterality: N/A;  . LEFT HEART CATH AND CORONARY ANGIOGRAPHY N/A 05/07/2017   Procedure: LEFT HEART CATH AND CORONARY ANGIOGRAPHY;  Surgeon: Charolette Forward, MD;  Location: [place] CV LAB;  Service: Cardiovascular;  Laterality: N/A;  . LEFT HEART CATHETERIZATION WITH CORONARY ANGIOGRAM N/A 08/30/2013   Procedure: LEFT HEART CATHETERIZATION WITH CORONARY ANGIOGRAM;  Surgeon: Clent Demark, MD;  Location: [facility] CATH LAB;  Service: Cardiovascular;  Laterality: N/A;  . PERCUTANEOUS CORONARY STENT INTERVENTION (PCI-S) Right 08/30/2013   Procedure: PERCUTANEOUS CORONARY STENT INTERVENTION (PCI-S);  Surgeon: Clent Demark, MD;  Location: [facility] C[facility] LAB;  Service: Cardiovascular;  Laterality: Right;   Social History   Socioeconomic History  . Marital status: Married    Spouse name: Not on file  . Number of children: 2  . Years of education: 41  . Highest education level: 12th grade  Occupational History  . Occupation: Brewing technologist     Comment: retired   Tobacco Use  . Smoking status: Former Smoker    Packs/day: 0.50    Years: 33.00    Pack years: 16.50    Types: Cigarettes    Quit  date: 11/29/2002    Years since quitting: 17.8  . Smokeless tobacco: Never Used  Vaping Use  . Vaping Use: Never used  Substance and Sexual Activity  . Alcohol use: No  . Drug use: No  . Sexual activity: Not Currently  Other Topics Concern  . Not on file  Social History Narrative   Patient drinks about 3 cups of soda daily.    Patient is left handed.    Lives alone with wife    Social Determinants of Health   Financial Resource Strain: Low Risk   . Difficulty of Paying Living Expenses: Not hard at all  Food Insecurity: Not on file  Transportation Needs: No Transportation Needs  . Lack of Transportation (Medical): No  . Lack of Transportation (Non-Medical): No  Physical Activity: Inactive  . Days of Exercise per Week: 0 days  . Minutes of Exercise per Session: 0 min  Stress: Not on file  Social Connections: Moderately Integrated  . Frequency of Communication with Friends and Family: Three times a week  . Frequency of Social Gatherings with Friends and Family: Three times a week  . Attends Religious Services: 1 to 4 times per year  . Active Member of Clubs or Organizations: No  . Attends Archivist Meetings: Never  . Marital Status: Married   Outpatient Encounter Medications as of 09/24/2020  Medication Sig  . ACCU-CHEK AVIVA PLUS test strip TEST FOUR TIMES DAILY  . acetaminophen (TYLENOL) 500 MG tablet Take 1,000 mg by mouth every 6 (six) hours as needed for moderate pain (left thigh).   Marland Kitchen albuterol (VENTOLIN HFA) 108 (90 Base) MCG/ACT inhaler Inhale 1-2 puffs into the lungs every 6 (six) hours as needed for wheezing or shortness of breath.  . allopurinol (ZYLOPRIM) 300 MG tablet TAKE 1 TABLET (300 MG TOTAL) BY MOUTH DAILY.  Marland Kitchen aspirin EC 81 MG tablet Take 81 mg by mouth daily.  . Blood Glucose Calibration (TRUE METRIX LEVEL 1) Low SOLN Use as directed  . Blood Glucose Monitoring Suppl (TRUE METRIX METER) w/Device KIT USE AS DIRECTED  . budesonide-formoterol  (SYMBICORT) 80-4.5 MCG/ACT inhaler Inhale 2 puffs into the lungs daily.  . clopidogrel (PLAVIX) 75 MG tablet TAKE 1 TABLET EVERY DAY  . DROPLET INSULIN SYRINGE 31G X 5/16" 1 ML MISC USE AS DIRECTED IN THE MORNING AND AT BEDTIME  . isosorbide mononitrate (IMDUR) 30 MG 24 hr tablet Take 1 tablet (30 mg total) by mouth daily.  Marland Kitchen levETIRAcetam (KEPPRA) 500 MG tablet TAKE 1 TABLET TWICE DAILY  . levothyroxine (SYNTHROID) 100 MCG tablet TAKE 1 TABLET (100 MCG TOTAL) BY MOUTH DAILY BEFORE BREAKFAST.  . metoprolol tartrate (LOPRESSOR) 50 MG tablet TAKE 1/2 TABLET TWICE DAILY  . nitroGLYCERIN (NITROSTAT) 0.4 MG SL tablet Place 1 tablet (0.4 mg total) under the tongue every 5 (five) minutes as needed for chest pain.  Marland Kitchen NOVOLIN 70/30 (70-30) 100 UNIT/ML injection INJECT 55 UNITS SUBCUTANEOUSLY WITH  BREAKFAST AND 45 UNITS WITH SUPPER.  . pantoprazole (PROTONIX) 40 MG tablet TAKE 1 TABLET EVERY DAY BEFORE BREAKFAST  . polyethylene glycol (MIRALAX / GLYCOLAX) 17 g packet Take 17 g by mouth daily as needed.  . pravastatin (PRAVACHOL) 40 MG tablet TAKE 2 TABLETS EVERY DAY  . spironolactone (ALDACTONE) 25 MG tablet TAKE 1 TABLET EVERY DAY  . TRUEplus Lancets 33G MISC TEST BLOOD SUGAR FOUR TIMES DAILY  . vitamin B-12 (CYANOCOBALAMIN) 1000 MCG tablet Take 1,000 mcg by mouth daily.   No facility-administered encounter medications on file as of 09/24/2020.   ALLERGIES: No Known Allergies VACCINATION STATUS: Immunization History  Administered Date(s) Administered  . Fluad Quad(high Dose 65+) 01/31/2019, 03/01/2020  . Influenza Whole 03/18/2005  . Influenza, High Dose Seasonal PF 02/22/2018  . Influenza,inj,Quad PF,6+ Mos 03/19/2017  . PFIZER(Purple Top)SARS-COV-2 Vaccination 06/25/2019, 07/16/2019, 04/22/2020  . Pneumococcal Conjugate-13 12/08/2013  . Pneumococcal Polysaccharide-23 06/09/2012  . Td 12/04/2003  . Tdap 03/06/2017    Diabetes He presents for his follow-up diabetic visit. He has type 2  diabetes mellitus. Onset time: He was diagnosed at approximate age of 4 years. His disease course has been improving. There are no hypoglycemic associated symptoms. Pertinent negatives for hypoglycemia include no confusion, headaches, nervousness/anxiousness, pallor, seizures or tremors. Pertinent negatives for diabetes include no chest pain, no fatigue, no polydipsia, no polyphagia, no polyuria, no weakness and no weight loss. There are no hypoglycemic complications. Symptoms are stable. Diabetic complications include a CVA, heart disease, nephropathy and peripheral neuropathy. Risk factors for coronary artery disease include dyslipidemia, diabetes mellitus, male sex, obesity, tobacco exposure, sedentary lifestyle and hypertension. Current diabetic treatment includes insulin injections. He is compliant with treatment most of the time. His weight is increasing steadily. He is following a generally unhealthy diet. When asked about meal planning, he reported none. He has not had a previous visit with a dietitian. He rarely participates in exercise. His home blood glucose trend is fluctuating minimally. His breakfast blood glucose range is generally 110-130 mg/dl. His dinner blood glucose range is generally 110-130 mg/dl. (He presents today with his logs, no meter, showing stable at goal fasting and postprandial glycemic profile.  His POCT A1c today is 7.5%, improving from last visit of 8.3%.  There are no episodes of hypoglycemia noted or reported.) An ACE inhibitor/angiotensin II receptor blocker is not being taken. He does not see a podiatrist.Eye exam is current.  Hyperlipidemia This is a chronic problem. The current episode started more than 1 year ago. The problem is uncontrolled. Recent lipid tests were reviewed and are variable (elevated triglycerides). Exacerbating diseases include chronic renal disease, diabetes, hypothyroidism and obesity. Factors aggravating his hyperlipidemia include beta blockers and  fatty foods. Associated symptoms include shortness of breath. Pertinent negatives include no chest pain or myalgias. Current antihyperlipidemic treatment includes statins. The current treatment provides moderate improvement of lipids. Compliance problems include adherence to exercise and adherence to diet (r/t decreased mobility from prior CVA).  Risk factors for coronary artery disease include diabetes mellitus, dyslipidemia, hypertension, male sex, a sedentary lifestyle and obesity.  Hypertension This is a chronic problem. The current episode started more than 1 year ago. The problem has been waxing and waning since onset. The problem is uncontrolled. Associated symptoms include shortness of breath. Pertinent negatives include no chest pain, headaches, neck pain or palpitations. Agents associated with hypertension include thyroid hormones. Risk factors for coronary artery disease include dyslipidemia, diabetes mellitus, sedentary lifestyle, male gender and obesity. Past treatments include  beta blockers and diuretics. The current treatment provides mild improvement. There are no compliance problems.  Hypertensive end-organ damage includes kidney disease, CAD/MI and CVA. Identifiable causes of hypertension include chronic renal disease and a thyroid problem.  Thyroid Problem Presents for follow-up visit. Symptoms include weight gain. Patient reports no anxiety, cold intolerance, constipation, depressed mood, diarrhea, fatigue, heat intolerance, palpitations, tremors or weight loss. The symptoms have been stable. Past treatments include levothyroxine. His past medical history is significant for diabetes and hyperlipidemia.   Review of systems  Constitutional: + Minimally fluctuating body weight,  current There is no height or weight on file to calculate BMI. , no fatigue, no subjective hyperthermia, no subjective hypothermia Eyes: no blurry vision, no xerophthalmia ENT: no sore throat, no nodules palpated  in throat, no dysphagia/odynophagia, no hoarseness Cardiovascular: no chest pain, no shortness of breath, no palpitations, no leg swelling Respiratory: no cough, no shortness of breath Gastrointestinal: no nausea/vomiting/diarrhea Musculoskeletal: no muscle/joint aches, walks with walker due to mobility deficits from previous CVA Skin: no rashes, no hyperemia Neurological: no tremors, no tingling, no dizziness, numbness reported in RUE and RLE (from previous stroke) Psychiatric: no depression, no anxiety    Objective:    BP (!) 171/78   Pulse 78   Ht 5' 10.5" (1.791 m)   Wt 263 lb (119.3 kg)   BMI 37.20 kg/m   Wt Readings from Last 3 Encounters:  09/24/20 263 lb (119.3 kg)  08/08/20 253 lb (114.8 kg)  07/02/20 262 lb (118.8 kg)     BP Readings from Last 3 Encounters:  09/24/20 (!) 171/78  07/02/20 (!) 156/62  06/27/20 (!) 161/77     Physical Exam- Limited  Constitutional:  There is no height or weight on file to calculate BMI. , not in acute distress, normal state of mind Eyes:  EOMI, no exophthalmos Neck: Supple Cardiovascular: RRR, no murmers, rubs, or gallops, no edema Respiratory: Adequate breathing efforts, no crackles, rales, rhonchi, or wheezing Musculoskeletal: no gross deformities, strength intact in all four extremities, no gross restriction of joint movements, walks with walker due to mobility deficits from previous CVA Skin:  no rashes, no hyperemia Neurological: no tremor with outstretched hands    CMP     Component Value Date/Time   NA 138 06/20/2020 0907   K 5.4 (H) 06/20/2020 0907   CL 101 06/20/2020 0907   CO2 22 06/20/2020 0907   GLUCOSE 156 (H) 06/20/2020 0907   GLUCOSE 192 (H) 02/16/2020 0838   BUN 21 06/20/2020 0907   CREATININE 1.45 (H) 06/20/2020 0907   CREATININE 1.12 02/16/2020 0838   CALCIUM 9.4 06/20/2020 0907   PROT 7.0 06/20/2020 0907   ALBUMIN 3.9 06/20/2020 0907   AST 13 06/20/2020 0907   ALT 11 06/20/2020 0907   ALKPHOS 82  06/20/2020 0907   BILITOT 0.3 06/20/2020 0907   GFRNONAA 46 (L) 06/20/2020 0907   GFRNONAA 63 02/16/2020 0838   GFRAA 53 (L) 06/20/2020 0907   GFRAA 73 02/16/2020 0838     Diabetic Labs (most recent): Lab Results  Component Value Date   HGBA1C 7.5 (A) 09/24/2020   HGBA1C 8.3 (H) 06/20/2020   HGBA1C 8.2 (A) 02/22/2020     Lipid Panel ( most recent) Lipid Panel     Component Value Date/Time   CHOL 110 09/14/2020 1035   TRIG 152 (H) 09/14/2020 1035   HDL 24 (L) 09/14/2020 1035   CHOLHDL 4.6 09/14/2020 1035   CHOLHDL 5.3 (H) 10/11/2019 2091  VLDL 35 04/20/2017 1130   LDLCALC 59 09/14/2020 1035   LDLCALC 67 10/11/2019 0924   LDLDIRECT 50 08/18/2014 0737      Assessment & Plan:   1) Type 2 diabetes mellitus with multiple competitions including diabetic nephropathy  -His diabetes is complicated by coronary artery disease, CVA, CKD and patient remains at a high risk for more acute and chronic complications of diabetes which include CAD, CVA, CKD, retinopathy, and neuropathy. These are all discussed in detail with the patient.  He presents today with his logs, no meter, showing stable at goal fasting and postprandial glycemic profile.  His POCT A1c today is 7.5%, improving from last visit of 8.3%.  There are no episodes of hypoglycemia noted or reported.  - Recent labs reviewed.  - Nutritional counseling repeated at each appointment due to patients tendency to fall back in to old habits.  - The patient admits there is a room for improvement in their diet and drink choices. -  Suggestion is made for the patient to avoid simple carbohydrates from their diet including Cakes, Sweet Desserts / Pastries, Ice Cream, Soda (diet and regular), Sweet Tea, Candies, Chips, Cookies, Sweet Pastries, Store Bought Juices, Alcohol in Excess of 1-2 drinks a day, Artificial Sweeteners, Coffee Creamer, and "Sugar-free" Products. This will help patient to have stable blood glucose profile and  potentially avoid unintended weight gain.   - I encouraged the patient to switch to unprocessed or minimally processed complex starch and increased protein intake (animal or plant source), fruits, and vegetables.   - Patient is advised to stick to a routine mealtimes to eat 3 meals a day and avoid unnecessary snacks (to snack only to correct hypoglycemia).  - I have approached patient with the following individualized plan to manage diabetes and patient agrees.  - He has done reasonably very well on premixed Novolin 70/30. He wishes to stay on the same regimen for cost reasons.   - Given his stable, at goal, glycemic profile, he is advised to continue current regimen of Novolin 70/30 55 units with breakfast and 45 units with supper if glucose is above 90 and he is eating.    -He is advised to continue monitoring blood glucose at least 3 times per day, before injecting insulin at breakfast and supper, and before bed.  He is instructed to call the clinic if he has readings less than 70 or greater than 300 for 3 tests in a row.  -He is warned not to take insulin without proper monitoring per orders.  -His kidney function had worsened since last visit, thus not a candidate to restart Metformin.   - Patient specific target  for A1c; LDL, HDL, Triglycerides, and  Waist Circumference were discussed in detail.  2) BP/HTN:  His blood pressure is not controlled to target.  He is advised to continue Metoprolol 25 mg po daily, Isosorbide 30 mg po daily, and Spironolactone 25 mg po daily.  He had not long taken his medication prior to his appointment with me today.  3) Lipids/HPL:  His most recent lipid panel from 09/14/20 shows controlled LDL of 59 and elevated triglycerides of 152 (improving).  He is advised to continue Pravastatin 40 mg po daily at bedtime.  Side effects and precautions discussed with him.  He is advised to avoid fried foods and butter.    4)  Weight/Diet: His Body mass index is 37.2  kg/m.-  He is a candidate for modest weight loss.  He cannot exercise  optimally, limited by pulmonary sufficiency due to COPD and previous CVA.   CDE consult in progress, exercise, and carbohydrates information provided.  5) Hypothyroidism:  -There are no recent thyroid function tests to review.  He is advised to continue Levothyroxine 100 mcg po daily before breakfast.  Will recheck TFTs prior to next visit.   - We discussed about the correct intake of his thyroid hormone, on empty stomach at fasting, with water, separated by at least 30 minutes from breakfast and other medications,  and separated by more than 4 hours from calcium, iron, multivitamins, acid reflux medications (PPIs). -Patient is made aware of the fact that thyroid hormone replacement is needed for life, dose to be adjusted by periodic monitoring of thyroid function tests.  6) Vitamin D deficiency His most recent vitamin D level on 09/14/20 was 25.3.  He is not sure if he is still taking his supplement as his wife manages his medication.  He is advised to start OTC Vitamin D3 5000 units daily until next measurement.  7) Chronic Care/Health Maintenance: -Patient is on Statin medications and encouraged to continue to follow up with Ophthalmology, nephrology, Podiatrist at least yearly or according to recommendations, and advised to stay away from smoking. I have recommended yearly flu vaccine and pneumonia vaccination at least every 5 years;  and  sleep for at least 7 hours a day.   - I advised patient to maintain close follow up with Fayrene Helper, MD for primary care needs.    I spent 34 minutes in the care of the patient today including review of labs from [place], Lipids, Thyroid Function, Hematology (current and previous including abstractions from other facilities); face-to-face time discussing  his blood glucose readings/logs, discussing hypoglycemia and hyperglycemia episodes and symptoms, medications doses, his options  of short and long term treatment based on the latest standards of care / guidelines;  discussion about incorporating lifestyle medicine;  and documenting the encounter.    Please refer to Patient Instructions for Blood Glucose Monitoring and Insulin/Medications Dosing Guide"  in media tab for additional information. Please  also refer to " Patient Self Inventory" in the Media  tab for reviewed elements of pertinent patient history.  Harlow Mares participated in the discussions, expressed understanding, and voiced agreement with the above plans.  All questions were answered to his satisfaction. he is encouraged to contact clinic should he have any questions or concerns prior to his return visit.   Follow up plan: -Return in about 4 months (around 01/25/2021) for Diabetes F/U- A1c and urine micro in office, Thyroid follow up, Previsit labs, Bring meter and logs.  Rayetta Pigg, [facility] [address] [place], [place] [address] Phone: [phone] Fax: [phone]  09/24/2020, 10:19 AM

## 2020-10-19 ENCOUNTER — Other Ambulatory Visit: Payer: Self-pay | Admitting: Family Medicine

## 2020-11-02 ENCOUNTER — Other Ambulatory Visit: Payer: Self-pay | Admitting: Family Medicine

## 2020-11-08 ENCOUNTER — Other Ambulatory Visit: Payer: Self-pay | Admitting: Family Medicine

## 2020-11-16 DIAGNOSIS — E559 Vitamin D deficiency, unspecified: Secondary | ICD-10-CM | POA: Diagnosis not present

## 2020-11-16 DIAGNOSIS — I5030 Unspecified diastolic (congestive) heart failure: Secondary | ICD-10-CM | POA: Diagnosis not present

## 2020-11-16 DIAGNOSIS — E785 Hyperlipidemia, unspecified: Secondary | ICD-10-CM | POA: Diagnosis not present

## 2020-11-16 DIAGNOSIS — I10 Essential (primary) hypertension: Secondary | ICD-10-CM | POA: Diagnosis not present

## 2020-11-16 DIAGNOSIS — I25118 Atherosclerotic heart disease of native coronary artery with other forms of angina pectoris: Secondary | ICD-10-CM | POA: Diagnosis not present

## 2020-11-16 DIAGNOSIS — M109 Gout, unspecified: Secondary | ICD-10-CM | POA: Diagnosis not present

## 2020-11-16 DIAGNOSIS — E119 Type 2 diabetes mellitus without complications: Secondary | ICD-10-CM | POA: Diagnosis not present

## 2020-12-19 ENCOUNTER — Other Ambulatory Visit: Payer: Self-pay | Admitting: Family Medicine

## 2020-12-19 DIAGNOSIS — I2510 Atherosclerotic heart disease of native coronary artery without angina pectoris: Secondary | ICD-10-CM

## 2020-12-31 ENCOUNTER — Ambulatory Visit: Payer: Medicare HMO | Admitting: Family Medicine

## 2021-01-03 ENCOUNTER — Other Ambulatory Visit: Payer: Self-pay | Admitting: Family Medicine

## 2021-01-04 ENCOUNTER — Ambulatory Visit (INDEPENDENT_AMBULATORY_CARE_PROVIDER_SITE_OTHER): Payer: Medicare HMO | Admitting: Family Medicine

## 2021-01-04 ENCOUNTER — Other Ambulatory Visit: Payer: Self-pay

## 2021-01-04 ENCOUNTER — Encounter: Payer: Self-pay | Admitting: Family Medicine

## 2021-01-04 VITALS — BP 168/80 | HR 71 | Resp 20 | Ht 71.0 in | Wt 261.0 lb

## 2021-01-04 DIAGNOSIS — J449 Chronic obstructive pulmonary disease, unspecified: Secondary | ICD-10-CM

## 2021-01-04 DIAGNOSIS — E782 Mixed hyperlipidemia: Secondary | ICD-10-CM | POA: Diagnosis not present

## 2021-01-04 DIAGNOSIS — E038 Other specified hypothyroidism: Secondary | ICD-10-CM | POA: Diagnosis not present

## 2021-01-04 DIAGNOSIS — M25422 Effusion, left elbow: Secondary | ICD-10-CM | POA: Diagnosis not present

## 2021-01-04 DIAGNOSIS — Z9181 History of falling: Secondary | ICD-10-CM

## 2021-01-04 DIAGNOSIS — I10 Essential (primary) hypertension: Secondary | ICD-10-CM

## 2021-01-04 DIAGNOSIS — E119 Type 2 diabetes mellitus without complications: Secondary | ICD-10-CM

## 2021-01-04 DIAGNOSIS — M109 Gout, unspecified: Secondary | ICD-10-CM | POA: Diagnosis not present

## 2021-01-04 MED ORDER — ALBUTEROL SULFATE HFA 108 (90 BASE) MCG/ACT IN AERS: 1.0000 | INHALATION_SPRAY | Freq: Four times a day (QID) | RESPIRATORY_TRACT | 1 refills | Status: DC | PRN

## 2021-01-04 MED ORDER — SPIRONOLACTONE 50 MG PO TABS: 50.0000 mg | ORAL_TABLET | Freq: Every day | ORAL | 0 refills | Status: DC

## 2021-01-04 NOTE — Progress Notes (Signed)
[name]     MRN: [id number]      DOB: [birth year]   HPI [name] is here for follow up and re-evaluation of chronic medical conditions, medication management and review of any available recent lab and radiology data.  Preventive health is updated, specifically  Cancer screening and Immunization.   Questions or concerns regarding consultations or procedures which the PT has had in the interim are  addressed. The PT denies any adverse reactions to current medications since the last visit.  Painless right elbow swelling, unsure how long present, and no known recent trauma, has full rOM elbow Denies polyuria, polydipsia, blurred vision , or hypoglycemic episodes.[place] forward off a chair in past 2 weeks, when bending down to retrieve a pen, no injury ROS Denies recent fever or chills.Short of breath with activity Denies sinus pressure, nasal congestion, ear pain or sore throat. Denies chest congestion, productive cough or wheezing. Denies chest pains, palpitations and leg swelling Denies abdominal pain, nausea, vomiting,diarrhea or constipation.   Denies dysuria, frequency, hesitancy or incontinence. Chronic  limitation in mobility. Denies headaches, seizures, right numbness, and  tingling. Denies depression, anxiety or insomnia. C/o rash.on left elbow   PE  BP (!) 168/80   Pulse 71   Resp 20   Ht '5\' 11"'$  (1.803 m)   Wt 261 lb (118.4 kg)   BMI 36.40 kg/m   Patient alert and oriented poor exercise tolerance , de sats with minimal activity  HEENT: No facial asymmetry, EOMI,     Neck decreased ROM.  Chest: adequate air entry though reduced , scattered crackles and few wheezes.  CVS: S1, S2 no murmurs, no S3.Regular rate.  ABD: Soft non tender.   Ext: No edema  MS: decreased  ROM spine, shoulders, hips and knees.  Skin: Intact,  rash noted.on posterior left elbow  Psych: Good eye contact, normal affect. not anxious or depressed appearing.  CNS: CN 2-12 intact,  grade 4 power on right upper and lower extremities  Assessment & Plan  Essential hypertension Uncontrolled, increase spironolactone to 50 mg daily and re evaluate in 4 to 6 weeks DASH diet and commitment to daily physical activity for a minimum of 30 minutes discussed and encouraged, as a part of hypertension management. The importance of attaining a healthy weight is also discussed.  BP/Weight 01/04/2021 09/24/2020 08/08/2020 07/02/2020 06/27/2020 03/01/2020 0000000  Systolic BP XX123456 XX123456 - A999333 Q000111Q Q000111Q 123456  Diastolic BP 80 78 - 62 77 84 83  Wt. (Lbs) 261 263 253 262 262 257.8 256.8  BMI 36.4 37.2 36.3 37.59 37.06 36.47 36.33       At moderate risk for fall Recent fall reported while bending forward, advised to change position slowly, also home safety discusse  COPD (chronic obstructive pulmonary disease) (HCC) Stable, needs supplemental oxygen, reports problems with the previous supplier and does not want to resume oxygen at this time, will follow up on this  Hypothyroidism Managed by Endo  Mixed hyperlipidemia TG elevated,will add zetia Hyperlipidemia:Low fat diet discussed and encouraged.   Lipid Panel  Lab Results  Component Value Date   CHOL 132 01/04/2021   HDL 26 (L) 01/04/2021   LDLCALC 76 01/04/2021   LDLDIRECT 50 08/18/2014   TRIG 173 (H) 01/04/2021   CHOLHDL 5.1 (H) 01/04/2021       Type II diabetes mellitus ([place]) Managed by Endo, will be followed up by Endo [name] is reminded of the importance of commitment to daily physical activity  for 30 minutes or more, as able and the need to limit carbohydrate intake to 30 to 60 grams per meal to help with blood sugar control.   The need to take medication as prescribed, test blood sugar as directed, and to call between visits if there is a concern that blood sugar is uncontrolled is also discussed.   [name] is reminded of the importance of daily foot exam, annual eye examination, and good blood sugar, blood  pressure and cholesterol control.  Diabetic Labs Latest Ref Rng & Units 01/04/2021 09/24/2020 09/14/2020 07/03/2020 06/20/2020  HbA1c 4.0 - 5.6 % - 7.5(A) - - 8.3(H)  Microalbumin mg/dL - - - - -  Micro/Creat Ratio 0 - 29 mg/g creat 90(H) - - - -  Chol 100 - 199 mg/dL 132 - 110 113 -  HDL >39 mg/dL 26(L) - 24(L) 25(L) -  Calc LDL 0 - 99 mg/dL 76 - 59 55 -  Triglycerides 0 - 149 mg/dL 173(H) - 152(H) 202(H) -  Creatinine 0.76 - 1.27 mg/dL 1.37(H) - - - 1.45(H)   BP/Weight 01/04/2021 09/24/2020 08/08/2020 07/02/2020 06/27/2020 03/01/2020 0000000  Systolic BP XX123456 XX123456 - A999333 Q000111Q Q000111Q 123456  Diastolic BP 80 78 - 62 77 84 83  Wt. (Lbs) 261 263 253 262 262 257.8 256.8  BMI 36.4 37.2 36.3 37.59 37.06 36.47 36.33   Foot/eye exam completion dates Latest Ref Rng & Units 07/12/2018 10/06/2017  Eye Exam No Retinopathy - -  Foot exam Order - - -  Foot Form Completion - Done Done        Morbid obesity ([place])  Patient re-educated about  the importance of commitment to a  minimum of 150 minutes of exercise per week as able.  The importance of healthy food choices with portion control discussed, as well as eating regularly and within a 12 hour window most days. The need to choose "clean , green" food 50 to 75% of the time is discussed, as well as to make water the primary drink and set a goal of 64 ounces water daily.    Weight /BMI 01/04/2021 09/24/2020 08/08/2020  WEIGHT 261 lb 263 lb 253 lb  HEIGHT '5\' 11"'$  5' 10.5" '5\' 10"'$   BMI 36.4 kg/m2 37.2 kg/m2 36.3 kg/m2

## 2021-01-04 NOTE — Patient Instructions (Addendum)
F/U in September to reevaluate blood pressure and for for the flu vaccine.  Call if you need me sooner.  Blood pressure elevated new higher dose of spironolactone is 50 mg 1 daily.  Stop spironolactone 25 mg once you have the 50 mg tablet.  You do NEED oxygen we will check and document change in oxygen with 2 L so we can move forward ordering it for you to protect your heart and lungs(nurse please insert in a note)  Please arrange diabetic retinal screening in the office  and give patient an appointment at checkout.  Labs today lipid CMP and EGFR microalbumin magnesium level.Uric acid  LEVEL  Please get your second COVID booster.  At your pharmacy.  Please get your 2 Shingrix vaccines at your pharmacy.   Careful not to fall  Thanks for choosing [facility], we consider it a privelige to serve you.

## 2021-01-04 NOTE — Assessment & Plan Note (Signed)
Uncontrolled, increase spironolactone to 50 mg daily and re evaluate in 4 to 6 weeks DASH diet and commitment to daily physical activity for a minimum of 30 minutes discussed and encouraged, as a part of hypertension management. The importance of attaining a healthy weight is also discussed.  BP/Weight 01/04/2021 09/24/2020 08/08/2020 07/02/2020 06/27/2020 03/01/2020 0000000  Systolic BP XX123456 XX123456 - A999333 Q000111Q Q000111Q 123456  Diastolic BP 80 78 - 62 77 84 83  Wt. (Lbs) 261 263 253 262 262 257.8 256.8  BMI 36.4 37.2 36.3 37.59 37.06 36.47 36.33

## 2021-01-06 ENCOUNTER — Encounter: Payer: Self-pay | Admitting: Family Medicine

## 2021-01-06 ENCOUNTER — Telehealth: Payer: Self-pay | Admitting: Family Medicine

## 2021-01-06 DIAGNOSIS — M25422 Effusion, left elbow: Secondary | ICD-10-CM | POA: Insufficient documentation

## 2021-01-06 LAB — CMP14+EGFR
ALT: 7 IU/L (ref 0–44)
AST: 12 IU/L (ref 0–40)
Albumin/Globulin Ratio: 1.3 (ref 1.2–2.2)
Albumin: 4.1 g/dL (ref 3.7–4.7)
Alkaline Phosphatase: 76 IU/L (ref 44–121)
BUN/Creatinine Ratio: 14 (ref 10–24)
BUN: 19 mg/dL (ref 8–27)
Bilirubin Total: 0.3 mg/dL (ref 0.0–1.2)
CO2: 22 mmol/L (ref 20–29)
Calcium: 9.2 mg/dL (ref 8.6–10.2)
Chloride: 101 mmol/L (ref 96–106)
Creatinine, Ser: 1.37 mg/dL — ABNORMAL HIGH (ref 0.76–1.27)
Globulin, Total: 3.2 g/dL (ref 1.5–4.5)
Glucose: 114 mg/dL — ABNORMAL HIGH (ref 65–99)
Potassium: 4.8 mmol/L (ref 3.5–5.2)
Sodium: 139 mmol/L (ref 134–144)
Total Protein: 7.3 g/dL (ref 6.0–8.5)
eGFR: 53 mL/min/{1.73_m2} — ABNORMAL LOW (ref >59)

## 2021-01-06 LAB — MICROALBUMIN / CREATININE URINE RATIO
Creatinine, Urine: 72.9 mg/dL
Microalb/Creat Ratio: 90 mg/g creat — ABNORMAL HIGH (ref 0–29)
Microalbumin, Urine: 65.3 ug/mL

## 2021-01-06 LAB — LIPID PANEL
Chol/HDL Ratio: 5.1 ratio — ABNORMAL HIGH (ref 0.0–5.0)
Cholesterol, Total: 132 mg/dL (ref 100–199)
HDL: 26 mg/dL — ABNORMAL LOW (ref >39)
LDL Chol Calc (NIH): 76 mg/dL (ref 0–99)
Triglycerides: 173 mg/dL — ABNORMAL HIGH (ref 0–149)
VLDL Cholesterol Cal: 30 mg/dL (ref 5–40)

## 2021-01-06 LAB — URIC ACID: Uric Acid: 4.1 mg/dL (ref 3.8–8.4)

## 2021-01-06 LAB — MAGNESIUM: Magnesium: 1.8 mg/dL (ref 1.6–2.3)

## 2021-01-06 MED ORDER — EZETIMIBE 10 MG PO TABS: 10.0000 mg | ORAL_TABLET | Freq: Every day | ORAL | 3 refills | Status: DC

## 2021-01-06 NOTE — Telephone Encounter (Signed)
Please follow up with the patient , re need for supplemental oxygen, if he is in agreement , please arrange in office visit , nursing only so I can addend the most recent visit, thanks

## 2021-01-06 NOTE — Assessment & Plan Note (Signed)
  Patient re-educated about  the importance of commitment to a  minimum of 150 minutes of exercise per week as able.  The importance of healthy food choices with portion control discussed, as well as eating regularly and within a 12 hour window most days. The need to choose "clean , green" food 50 to 75% of the time is discussed, as well as to make water the primary drink and set a goal of 64 ounces water daily.    Weight /BMI 01/04/2021 09/24/2020 08/08/2020  WEIGHT 261 lb 263 lb 253 lb  HEIGHT '5\' 11"'$  5' 10.5" '5\' 10"'$   BMI 36.4 kg/m2 37.2 kg/m2 36.3 kg/m2

## 2021-01-06 NOTE — Assessment & Plan Note (Signed)
TG elevated,will add zetia Hyperlipidemia:Low fat diet discussed and encouraged.   Lipid Panel  Lab Results  Component Value Date   CHOL 132 01/04/2021   HDL 26 (L) 01/04/2021   LDLCALC 76 01/04/2021   LDLDIRECT 50 08/18/2014   TRIG 173 (H) 01/04/2021   CHOLHDL 5.1 (H) 01/04/2021

## 2021-01-06 NOTE — Assessment & Plan Note (Signed)
Managed by Endo 

## 2021-01-06 NOTE — Assessment & Plan Note (Signed)
Stable, needs supplemental oxygen, reports problems with the previous supplier and does not want to resume oxygen at this time, will follow up on this

## 2021-01-06 NOTE — Assessment & Plan Note (Signed)
Not incited by trauma

## 2021-01-06 NOTE — Assessment & Plan Note (Signed)
Managed by Endo, will be followed up by [name] is reminded of the importance of commitment to daily physical activity for 30 minutes or more, as able and the need to limit carbohydrate intake to 30 to 60 grams per meal to help with blood sugar control.   The need to take medication as prescribed, test blood sugar as directed, and to call between visits if there is a concern that blood sugar is uncontrolled is also discussed.   [name] is reminded of the importance of daily foot exam, annual eye examination, and good blood sugar, blood pressure and cholesterol control.  Diabetic Labs Latest Ref Rng & Units 01/04/2021 09/24/2020 09/14/2020 07/03/2020 06/20/2020  HbA1c 4.0 - 5.6 % - 7.5(A) - - 8.3(H)  Microalbumin mg/dL - - - - -  Micro/Creat Ratio 0 - 29 mg/g creat 90(H) - - - -  Chol 100 - 199 mg/dL 132 - 110 113 -  HDL >39 mg/dL 26(L) - 24(L) 25(L) -  Calc LDL 0 - 99 mg/dL 76 - 59 55 -  Triglycerides 0 - 149 mg/dL 173(H) - 152(H) 202(H) -  Creatinine 0.76 - 1.27 mg/dL 1.37(H) - - - 1.45(H)   BP/Weight 01/04/2021 09/24/2020 08/08/2020 07/02/2020 06/27/2020 03/01/2020 0000000  Systolic BP XX123456 XX123456 - A999333 Q000111Q Q000111Q 123456  Diastolic BP 80 78 - 62 77 84 83  Wt. (Lbs) 261 263 253 262 262 257.8 256.8  BMI 36.4 37.2 36.3 37.59 37.06 36.47 36.33   Foot/eye exam completion dates Latest Ref Rng & Units 07/12/2018 10/06/2017  Eye Exam No Retinopathy - -  Foot exam Order - - -  Foot Form Completion - Done Done

## 2021-01-06 NOTE — Assessment & Plan Note (Signed)
Recent fall reported while bending forward, advised to change position slowly, also home safety discusse

## 2021-01-08 NOTE — Telephone Encounter (Signed)
He sad he does not want to fool with oxygen anymore but states he will call back if he ever decides he needs it

## 2021-01-10 ENCOUNTER — Other Ambulatory Visit: Payer: Self-pay

## 2021-01-10 ENCOUNTER — Ambulatory Visit: Payer: Medicare HMO

## 2021-01-10 LAB — HM DIABETES EYE EXAM

## 2021-01-11 ENCOUNTER — Other Ambulatory Visit: Payer: Self-pay | Admitting: Family Medicine

## 2021-01-21 ENCOUNTER — Other Ambulatory Visit: Payer: Self-pay | Admitting: Family Medicine

## 2021-01-22 DIAGNOSIS — E038 Other specified hypothyroidism: Secondary | ICD-10-CM | POA: Diagnosis not present

## 2021-01-22 DIAGNOSIS — E1165 Type 2 diabetes mellitus with hyperglycemia: Secondary | ICD-10-CM | POA: Diagnosis not present

## 2021-01-23 LAB — COMPREHENSIVE METABOLIC PANEL
ALT: 7 IU/L (ref 0–44)
AST: 11 IU/L (ref 0–40)
Albumin/Globulin Ratio: 1.2 (ref 1.2–2.2)
Albumin: 3.8 g/dL (ref 3.7–4.7)
Alkaline Phosphatase: 71 IU/L (ref 44–121)
BUN/Creatinine Ratio: 13 (ref 10–24)
BUN: 17 mg/dL (ref 8–27)
Bilirubin Total: 0.3 mg/dL (ref 0.0–1.2)
CO2: 21 mmol/L (ref 20–29)
Calcium: 9.1 mg/dL (ref 8.6–10.2)
Chloride: 104 mmol/L (ref 96–106)
Creatinine, Ser: 1.26 mg/dL (ref 0.76–1.27)
Globulin, Total: 3.1 g/dL (ref 1.5–4.5)
Glucose: 96 mg/dL (ref 65–99)
Potassium: 4.3 mmol/L (ref 3.5–5.2)
Sodium: 139 mmol/L (ref 134–144)
Total Protein: 6.9 g/dL (ref 6.0–8.5)
eGFR: 58 mL/min/{1.73_m2} — ABNORMAL LOW (ref >59)

## 2021-01-23 LAB — T4, FREE: Free T4: 1.04 ng/dL (ref 0.82–1.77)

## 2021-01-23 LAB — TSH: TSH: 1.57 u[IU]/mL (ref 0.450–4.500)

## 2021-01-28 ENCOUNTER — Ambulatory Visit: Payer: Medicare HMO | Admitting: Nurse Practitioner

## 2021-01-28 ENCOUNTER — Encounter: Payer: Self-pay | Admitting: Nurse Practitioner

## 2021-01-28 ENCOUNTER — Other Ambulatory Visit: Payer: Self-pay

## 2021-01-28 VITALS — BP 149/66 | HR 68 | Ht 71.0 in | Wt 263.4 lb

## 2021-01-28 DIAGNOSIS — I10 Essential (primary) hypertension: Secondary | ICD-10-CM | POA: Diagnosis not present

## 2021-01-28 DIAGNOSIS — E038 Other specified hypothyroidism: Secondary | ICD-10-CM | POA: Diagnosis not present

## 2021-01-28 DIAGNOSIS — E1165 Type 2 diabetes mellitus with hyperglycemia: Secondary | ICD-10-CM | POA: Diagnosis not present

## 2021-01-28 DIAGNOSIS — E559 Vitamin D deficiency, unspecified: Secondary | ICD-10-CM

## 2021-01-28 DIAGNOSIS — E782 Mixed hyperlipidemia: Secondary | ICD-10-CM | POA: Diagnosis not present

## 2021-01-28 LAB — POCT GLYCOSYLATED HEMOGLOBIN (HGB A1C): Hemoglobin A1C: 6.6 % — AB (ref 4.0–5.6)

## 2021-01-28 NOTE — Patient Instructions (Signed)

## 2021-01-28 NOTE — Progress Notes (Signed)
01/28/2021  Endocrinology follow-up note  Subjective:    Patient ID: [name], male    DOB: [birth year], PCP Fayrene Helper, MD   Past Medical History:  Diagnosis Date   Abnormality of gait 05/01/2015   Anemia 04/19/2015   Annual physical exam 12/11/2013   Arthritis    "left leg; knees" (05/07/2017)   Arthritis of knee    CAD (coronary artery disease)    CHF (congestive heart failure) ([place])    Phreesia 08/08/2020   Choking 07/13/2015   Chronic bronchitis ([place])    "get it q yr"   Chronic lower back pain    "since I was a teen" (05/07/2017)   COPD (chronic obstructive pulmonary disease) ([place])    CVA (cerebral vascular accident) ([place]) 05/2015   R sided weakness (05/07/2017)   Diabetes mellitus without complication ([place])    Phreesia 08/08/2020   GERD (gastroesophageal reflux disease)    Gout    History of colonic polyps 04/03/2017   Added automatically from request for surgery [id number]   Hyperlipidemia    Hypertension    Myocardial infarction ([place]) 1999   Obesity    OSA on CPAP    Oxygen deficiency    Phreesia 08/08/2020   Pneumonia    "a few times; last time was ~ 06/2013" (05/07/2017)   Psoriasis    Stroke ([place])    Phreesia 08/08/2020   Thyroid disease    Phreesia 08/08/2020   Tussive syncope 05/01/2015   Type II diabetes mellitus ([place])    Vitamin B12 deficiency 05/01/2015   Past Surgical History:  Procedure Laterality Date   APPENDECTOMY     CATARACT EXTRACTION W/PHACO Left 10/07/2016   Procedure: CATARACT EXTRACTION PHACO AND INTRAOCULAR LENS PLACEMENT ([place]);  Surgeon: Rutherford Guys, MD;  Location: [facility];  Service: Ophthalmology;  Laterality: Left;  CDE: 13.49   CATARACT EXTRACTION W/PHACO Right 10/28/2016   Procedure: CATARACT EXTRACTION PHACO AND INTRAOCULAR LENS PLACEMENT ([facility]);  Surgeon: Rutherford Guys, MD;  Location: [facility];  Service: Ophthalmology;  Laterality: Right;  CDE: 16.71   COLONOSCOPY  03/17/2012   Procedure: COLONOSCOPY;  Surgeon: Rogene Houston, MD;  Location: [facility];  Service: Endoscopy;  Laterality: N/A;  830   COLONOSCOPY N/A 06/25/2017   Procedure: COLONOSCOPY;  Surgeon: Rogene Houston, MD;  [facility]: [facility];  Service: Endoscopy;  Laterality: N/A;  [place]- 2009-08/30/2013   "counting today's, I have 5 stents" (08/30/2013)   CORONARY ANGIOPLASTY WITH STENT PLACEMENT  05/07/2017   CORONARY STENT INTERVENTION N/A 05/07/2017   Procedure: CORONARY STENT INTERVENTION;  Surgeon: Charolette Forward, MD;  Location: [facility];  Service: Cardiovascular;  Laterality: N/A;   CYSTOSCOPY WITH URETHRAL DILATATION N/A 11/26/2012   Procedure: CYSTOSCOPY WITH URETHRAL DILATATION;  Surgeon: Malka So, MD;  Location: [facility];  Service: Urology;  Laterality: N/A;   LEFT HEART CATH AND CORONARY ANGIOGRAPHY N/A 05/07/2017   Procedure: LEFT HEART CATH AND CORONARY ANGIOGRAPHY;  Surgeon: Charolette Forward, MD;  Location: [facility];  Service: Cardiovascular;  Laterality: N/A;   LEFT HEART CATHETERIZATION WITH CORONARY ANGIOGRAM N/A 08/30/2013   Procedure: LEFT HEART CATHETERIZATION WITH CORONARY ANGIOGRAM;  Surgeon: Clent Demark, MD;  Location: [facility];  Service: Cardiovascular;  Laterality: N/A;   PERCUTANEOUS CORONARY STENT INTERVENTION (PCI-S) Right 08/30/2013   Procedure: PERCUTANEOUS CORONARY STENT INTERVENTION (PCI-S);  Surgeon: Clent Demark, MD;  Location: [facility];  Service: Cardiovascular;  Laterality: Right;   Social History   Socioeconomic History   Marital status: Married    Spouse name: Not on file   Number of children: 2   Years of education: 11   Highest education level: 12th grade  Occupational History   Occupation: Brewing technologist     Comment: retired   Tobacco Use   Smoking status: Former    Packs/day: 0.50    Years: 33.00    Pack years: 16.50    Types: Cigarettes    Quit date: 11/29/2002    Years since quitting: 18.1   Smokeless  tobacco: Never  Vaping Use   Vaping Use: Never used  Substance and Sexual Activity   Alcohol use: No   Drug use: No   Sexual activity: Not Currently  Other Topics Concern   Not on file  Social History Narrative   Patient drinks about 3 cups of soda daily.    Patient is left handed.    Lives alone with wife    Social Determinants of Health   Financial Resource Strain: Low Risk    Difficulty of Paying Living Expenses: Not hard at all  Food Insecurity: Not on file  Transportation Needs: No Transportation Needs   Lack of Transportation (Medical): No   Lack of Transportation (Non-Medical): No  Physical Activity: Inactive   Days of Exercise per Week: 0 days   Minutes of Exercise per Session: 0 min  Stress: Not on file  Social Connections: Moderately Integrated   Frequency of Communication with Friends and Family: Three times a week   Frequency of Social Gatherings with Friends and Family: Three times a week   Attends Religious Services: 1 to 4 times per year   Active Member of Clubs or Organizations: No   Attends Archivist Meetings: Never   Marital Status: Married   Outpatient Encounter Medications as of 01/28/2021  Medication Sig   acetaminophen (TYLENOL) 500 MG tablet Take 1,000 mg by mouth every 6 (six) hours as needed for moderate pain (left thigh).    albuterol (VENTOLIN HFA) 108 (90 Base) MCG/ACT inhaler Inhale 1-2 puffs into the lungs every 6 (six) hours as needed for wheezing or shortness of breath.   allopurinol (ZYLOPRIM) 300 MG tablet TAKE 1 TABLET (300 MG TOTAL) BY MOUTH DAILY.   aspirin EC 81 MG tablet Take 81 mg by mouth daily.   Blood Glucose Calibration (TRUE METRIX LEVEL 1) Low SOLN Use as directed   Blood Glucose Monitoring Suppl (TRUE METRIX METER) w/Device KIT USE AS DIRECTED   budesonide-formoterol (SYMBICORT) 80-4.5 MCG/ACT inhaler Inhale 2 puffs into the lungs daily.   clopidogrel (PLAVIX) 75 MG tablet TAKE 1 TABLET EVERY DAY   DROPLET INSULIN  SYRINGE 31G X 5/16" 1 ML MISC USE AS DIRECTED IN THE MORNING AND AT BEDTIME   ezetimibe (ZETIA) 10 MG tablet Take 1 tablet (10 mg total) by mouth daily.   glucose blood (TRUE METRIX BLOOD GLUCOSE TEST) test strip 4 times daily testing dx e11.65   isosorbide mononitrate (IMDUR) 30 MG 24 hr tablet TAKE 1 TABLET EVERY DAY   levETIRAcetam (KEPPRA) 500 MG tablet TAKE 1 TABLET TWICE DAILY   levothyroxine (SYNTHROID) 100 MCG tablet TAKE 1 TABLET (100 MCG TOTAL) BY MOUTH DAILY BEFORE BREAKFAST.   metoprolol tartrate (LOPRESSOR) 50 MG tablet TAKE 1/2 TABLET TWICE DAILY   NOVOLIN 70/30 (70-30) 100 UNIT/ML injection INJECT 55 UNITS SUBCUTANEOUSLY WITH BREAKFAST AND 45 UNITS WITH SUPPER.   pantoprazole (PROTONIX)  40 MG tablet TAKE 1 TABLET EVERY DAY BEFORE BREAKFAST   polyethylene glycol (MIRALAX / GLYCOLAX) 17 g packet Take 17 g by mouth daily as needed.   pravastatin (PRAVACHOL) 40 MG tablet TAKE 2 TABLETS EVERY DAY   spironolactone (ALDACTONE) 50 MG tablet Take 1 tablet (50 mg total) by mouth daily.   TRUEplus Lancets 33G MISC TEST BLOOD SUGAR FOUR TIMES DAILY   vitamin B-12 (CYANOCOBALAMIN) 1000 MCG tablet Take 1,000 mcg by mouth daily.   nitroGLYCERIN (NITROSTAT) 0.4 MG SL tablet Place 1 tablet (0.4 mg total) under the tongue every 5 (five) minutes as needed for chest pain.   No facility-administered encounter medications on file as of 01/28/2021.   ALLERGIES: No Known Allergies VACCINATION STATUS: Immunization History  Administered Date(s) Administered   Fluad Quad(high Dose 65+) 01/31/2019, 03/01/2020   Influenza Whole 03/18/2005   Influenza, High Dose Seasonal PF 02/22/2018   Influenza,inj,Quad PF,6+ Mos 03/19/2017   PFIZER(Purple Top)SARS-COV-2 Vaccination 06/25/2019, 07/16/2019, 04/22/2020   Pneumococcal Conjugate-13 12/08/2013   Pneumococcal Polysaccharide-23 06/09/2012   Td 12/04/2003   Tdap 03/06/2017    Diabetes He presents for his follow-up diabetic visit. He has type 2 diabetes  mellitus. Onset time: He was diagnosed at approximate age of 18 years. His disease course has been improving. There are no hypoglycemic associated symptoms. Pertinent negatives for hypoglycemia include no confusion, headaches, nervousness/anxiousness, pallor, seizures or tremors. Pertinent negatives for diabetes include no chest pain, no fatigue, no polydipsia, no polyphagia, no polyuria, no weakness and no weight loss. There are no hypoglycemic complications. Symptoms are stable. Diabetic complications include a CVA, heart disease, nephropathy and peripheral neuropathy. Risk factors for coronary artery disease include dyslipidemia, diabetes mellitus, male sex, obesity, tobacco exposure, sedentary lifestyle and hypertension. Current diabetic treatment includes insulin injections. He is compliant with treatment most of the time. His weight is increasing steadily. He is following a generally healthy diet. When asked about meal planning, he reported none. He has not had a previous visit with a dietitian. He rarely participates in exercise. His home blood glucose trend is fluctuating minimally. His breakfast blood glucose range is generally 110-130 mg/dl. His dinner blood glucose range is generally 110-130 mg/dl. (He presents today with his logs, no meter, showing stable, at goal fasting and postprandial glycemic profile.  His POCT A1c today is 6.6%, improving from last visit of 7.5%.  He denies any significant hypoglycemia.) An ACE inhibitor/angiotensin II receptor blocker is not being taken. He does not see a podiatrist.Eye exam is current.  Hyperlipidemia This is a chronic problem. The current episode started more than 1 year ago. The problem is uncontrolled. Recent lipid tests were reviewed and are variable (elevated triglycerides). Exacerbating diseases include chronic renal disease, diabetes, hypothyroidism and obesity. Factors aggravating his hyperlipidemia include beta blockers and fatty foods. Associated  symptoms include shortness of breath. Pertinent negatives include no chest pain or myalgias. Current antihyperlipidemic treatment includes statins. The current treatment provides moderate improvement of lipids. Compliance problems include adherence to exercise and adherence to diet (r/t decreased mobility from prior CVA).  Risk factors for coronary artery disease include diabetes mellitus, dyslipidemia, hypertension, male sex, a sedentary lifestyle and obesity.  Hypertension This is a chronic problem. The current episode started more than 1 year ago. The problem has been gradually improving since onset. The problem is controlled. Associated symptoms include shortness of breath. Pertinent negatives include no chest pain, headaches, neck pain or palpitations. Agents associated with hypertension include thyroid hormones. Risk factors for coronary  artery disease include dyslipidemia, diabetes mellitus, sedentary lifestyle, male gender and obesity. Past treatments include beta blockers and diuretics. The current treatment provides mild improvement. There are no compliance problems.  Hypertensive end-organ damage includes kidney disease, CAD/MI and CVA. Identifiable causes of hypertension include chronic renal disease and a thyroid problem.  Thyroid Problem Presents for follow-up visit. Patient reports no anxiety, cold intolerance, constipation, depressed mood, diarrhea, fatigue, heat intolerance, palpitations, tremors, weight gain or weight loss. The symptoms have been stable. Past treatments include levothyroxine. His past medical history is significant for diabetes and hyperlipidemia.    Review of systems  Constitutional: + Minimally fluctuating body weight,  current Body mass index is 36.74 kg/m. , no fatigue, no subjective hyperthermia, no subjective hypothermia Eyes: no blurry vision, no xerophthalmia ENT: no sore throat, no nodules palpated in throat, no dysphagia/odynophagia, no  hoarseness Cardiovascular: no chest pain, no shortness of breath, no palpitations, no leg swelling Respiratory: no cough, no shortness of breath Gastrointestinal: no nausea/vomiting/diarrhea Musculoskeletal: no muscle/joint aches, walks with platform walker due to mobility deficits from previous CVA Skin: no rashes, no hyperemia Neurological: no tremors, no tingling, no dizziness, numbness reported in RUE and RLE (from previous stroke) Psychiatric: no depression, no anxiety    Objective:    BP (!) 149/66   Pulse 68   Ht $R'5\' 11"'lf$  (1.803 m)   Wt 263 lb 6.4 oz (119.5 kg)   BMI 36.74 kg/m   Wt Readings from Last 3 Encounters:  01/28/21 263 lb 6.4 oz (119.5 kg)  01/04/21 261 lb (118.4 kg)  09/24/20 263 lb (119.3 kg)     BP Readings from Last 3 Encounters:  01/28/21 (!) 149/66  01/04/21 (!) 168/80  09/24/20 (!) 171/78      Physical Exam- Limited  Constitutional:  Body mass index is 36.74 kg/m. , not in acute distress, normal state of mind Eyes:  EOMI, no exophthalmos Neck: Supple Cardiovascular: RRR, no murmurs, rubs, or gallops, no edema Respiratory: Adequate breathing efforts, no crackles, rales, rhonchi, or wheezing Musculoskeletal: no gross deformities, walks with walker due to previous CVA Skin:  no rashes, no hyperemia Neurological: no tremor with outstretched hands    CMP     Component Value Date/Time   NA 139 01/22/2021 1022   K 4.3 01/22/2021 1022   CL 104 01/22/2021 1022   CO2 21 01/22/2021 1022   GLUCOSE 96 01/22/2021 1022   GLUCOSE 192 (H) 02/16/2020 0838   BUN 17 01/22/2021 1022   CREATININE 1.26 01/22/2021 1022   CREATININE 1.12 02/16/2020 0838   CALCIUM 9.1 01/22/2021 1022   PROT 6.9 01/22/2021 1022   ALBUMIN 3.8 01/22/2021 1022   AST 11 01/22/2021 1022   ALT 7 01/22/2021 1022   ALKPHOS 71 01/22/2021 1022   BILITOT 0.3 01/22/2021 1022   GFRNONAA 46 (L) 06/20/2020 0907   GFRNONAA 63 02/16/2020 0838   GFRAA 53 (L) 06/20/2020 0907   GFRAA 73  02/16/2020 0838     Diabetic Labs (most recent): Lab Results  Component Value Date   HGBA1C 6.6 (A) 01/28/2021   HGBA1C 7.5 (A) 09/24/2020   HGBA1C 8.3 (H) 06/20/2020     Lipid Panel ( most recent) Lipid Panel     Component Value Date/Time   CHOL 132 01/04/2021 1042   TRIG 173 (H) 01/04/2021 1042   HDL 26 (L) 01/04/2021 1042   CHOLHDL 5.1 (H) 01/04/2021 1042   CHOLHDL 5.3 (H) 10/11/2019 0924   VLDL 35 04/20/2017 1130   LDLCALC  76 01/04/2021 1042   LDLCALC 67 10/11/2019 0924   LDLDIRECT 50 08/18/2014 0737      Assessment & Plan:   1) Type 2 diabetes mellitus with multiple competitions including diabetic nephropathy  -His diabetes is complicated by coronary artery disease, CVA, CKD and patient remains at a high risk for more acute and chronic complications of diabetes which include CAD, CVA, CKD, retinopathy, and neuropathy. These are all discussed in detail with the patient.  He presents today with his logs, no meter, showing stable, at goal fasting and postprandial glycemic profile.  His POCT A1c today is 6.6%, improving from last visit of 7.5%.  He denies any significant hypoglycemia.  - Recent labs reviewed.  - Nutritional counseling repeated at each appointment due to patients tendency to fall back in to old habits.  - The patient admits there is a room for improvement in their diet and drink choices. -  Suggestion is made for the patient to avoid simple carbohydrates from their diet including Cakes, Sweet Desserts / Pastries, Ice Cream, Soda (diet and regular), Sweet Tea, Candies, Chips, Cookies, Sweet Pastries, Store Bought Juices, Alcohol in Excess of 1-2 drinks a day, Artificial Sweeteners, Coffee Creamer, and "Sugar-free" Products. This will help patient to have stable blood glucose profile and potentially avoid unintended weight gain.   - I encouraged the patient to switch to unprocessed or minimally processed complex starch and increased protein intake (animal or  plant source), fruits, and vegetables.   - Patient is advised to stick to a routine mealtimes to eat 3 meals a day and avoid unnecessary snacks (to snack only to correct hypoglycemia).  - I have approached patient with the following individualized plan to manage diabetes and patient agrees.  - He has done reasonably very well on premixed Novolin 70/30. He wishes to stay on the same regimen for cost reasons.   - Given his stable, at goal, glycemic profile, he is advised to continue current regimen of Novolin 70/30 55 units with breakfast and 45 units with supper if glucose is above 90 and he is eating.    -He is advised to continue monitoring blood glucose at least 3 times per day, before injecting insulin at breakfast and supper, and before bed.  He is instructed to call the clinic if he has readings less than 70 or greater than 300 for 3 tests in a row.  -He is warned not to take insulin without proper monitoring per orders.  - Patient specific target  for A1c; LDL, HDL, Triglycerides, and  Waist Circumference were discussed in detail.  2) BP/HTN:  His blood pressure is controlled to target for his age.  He is advised to continue Metoprolol 25 mg po twice daily, Isosorbide 30 mg po daily, and Spironolactone 50 mg po daily.    3) Lipids/HPL:  His most recent lipid panel from 01/04/21 shows controlled LDL of 76 and elevated triglycerides of 173 (stable).  He is advised to continue Pravastatin 40 mg po daily at bedtime.  Side effects and precautions discussed with him.  He is advised to avoid fried foods and butter.    4)  Weight/Diet: His Body mass index is 36.74 kg/m.-  He is a candidate for modest weight loss.  He cannot exercise optimally, limited by pulmonary sufficiency due to COPD and previous CVA.   CDE consult in progress, exercise, and carbohydrates information provided.  5) Hypothyroidism:  -His previsit thyroid function tests are consistent with appropriate hormone replacement.  He is advised to continue current dose of Levothyroxine 100 mcg po daily before breakfast.     - We discussed about the correct intake of his thyroid hormone, on empty stomach at fasting, with water, separated by at least 30 minutes from breakfast and other medications,  and separated by more than 4 hours from calcium, iron, multivitamins, acid reflux medications (PPIs). -Patient is made aware of the fact that thyroid hormone replacement is needed for life, dose to be adjusted by periodic monitoring of thyroid function tests.  6) Vitamin D deficiency His most recent vitamin D level on 09/14/20 was 25.3.  He is not sure if he is still taking his supplement as his wife manages his medication.  He is advised to start OTC Vitamin D3 5000 units daily until next measurement.  7) Chronic Care/Health Maintenance: -Patient is on Statin medications and encouraged to continue to follow up with Ophthalmology, nephrology, Podiatrist at least yearly or according to recommendations, and advised to stay away from smoking. I have recommended yearly flu vaccine and pneumonia vaccination at least every 5 years;  and  sleep for at least 7 hours a day.   - I advised patient to maintain close follow up with Fayrene Helper, MD for primary care needs.      I spent 33 minutes in the care of the patient today including review of labs from [facility], Lipids, Thyroid Function, Hematology (current and previous including abstractions from other facilities); face-to-face time discussing  his blood glucose readings/logs, discussing hypoglycemia and hyperglycemia episodes and symptoms, medications doses, his options of short and long term treatment based on the latest standards of care / guidelines;  discussion about incorporating lifestyle medicine;  and documenting the encounter.    Please refer to Patient Instructions for Blood Glucose Monitoring and Insulin/Medications Dosing Guide"  in media tab for additional information.  Please  also refer to " Patient Self Inventory" in the Media  tab for reviewed elements of pertinent patient history.  [name] participated in the discussions, expressed understanding, and voiced agreement with the above plans.  All questions were answered to his satisfaction. he is encouraged to contact clinic should he have any questions or concerns prior to his return visit.   Follow up plan: -Return in about 6 months (around 07/28/2021) for Diabetes F/U with A1c in office, Thyroid follow up, Bring meter and logs, No previsit labs.  Rayetta Pigg, [facility] [address] [place], [place] [address] Phone: [phone] Fax: [phone]  01/28/2021, 10:39 AM

## 2021-02-11 DIAGNOSIS — E785 Hyperlipidemia, unspecified: Secondary | ICD-10-CM | POA: Diagnosis not present

## 2021-02-11 DIAGNOSIS — E119 Type 2 diabetes mellitus without complications: Secondary | ICD-10-CM | POA: Diagnosis not present

## 2021-02-11 DIAGNOSIS — M109 Gout, unspecified: Secondary | ICD-10-CM | POA: Diagnosis not present

## 2021-02-11 DIAGNOSIS — E559 Vitamin D deficiency, unspecified: Secondary | ICD-10-CM | POA: Diagnosis not present

## 2021-02-11 DIAGNOSIS — I10 Essential (primary) hypertension: Secondary | ICD-10-CM | POA: Diagnosis not present

## 2021-02-15 ENCOUNTER — Ambulatory Visit: Payer: Medicare HMO | Admitting: Family Medicine

## 2021-02-22 DIAGNOSIS — E785 Hyperlipidemia, unspecified: Secondary | ICD-10-CM | POA: Diagnosis not present

## 2021-02-22 DIAGNOSIS — I25118 Atherosclerotic heart disease of native coronary artery with other forms of angina pectoris: Secondary | ICD-10-CM | POA: Diagnosis not present

## 2021-02-22 DIAGNOSIS — I5030 Unspecified diastolic (congestive) heart failure: Secondary | ICD-10-CM | POA: Diagnosis not present

## 2021-02-22 DIAGNOSIS — I10 Essential (primary) hypertension: Secondary | ICD-10-CM | POA: Diagnosis not present

## 2021-02-25 ENCOUNTER — Other Ambulatory Visit: Payer: Self-pay | Admitting: Nurse Practitioner

## 2021-03-18 ENCOUNTER — Other Ambulatory Visit: Payer: Self-pay | Admitting: Family Medicine

## 2021-03-31 ENCOUNTER — Other Ambulatory Visit: Payer: Self-pay | Admitting: Family Medicine

## 2021-05-07 ENCOUNTER — Other Ambulatory Visit: Payer: Self-pay | Admitting: *Deleted

## 2021-05-07 DIAGNOSIS — J449 Chronic obstructive pulmonary disease, unspecified: Secondary | ICD-10-CM

## 2021-05-07 MED ORDER — ALBUTEROL SULFATE HFA 108 (90 BASE) MCG/ACT IN AERS: 1.0000 | INHALATION_SPRAY | Freq: Four times a day (QID) | RESPIRATORY_TRACT | 1 refills | Status: DC | PRN

## 2021-05-24 DIAGNOSIS — I10 Essential (primary) hypertension: Secondary | ICD-10-CM | POA: Diagnosis not present

## 2021-05-24 DIAGNOSIS — E119 Type 2 diabetes mellitus without complications: Secondary | ICD-10-CM | POA: Diagnosis not present

## 2021-05-24 DIAGNOSIS — I5030 Unspecified diastolic (congestive) heart failure: Secondary | ICD-10-CM | POA: Diagnosis not present

## 2021-05-24 DIAGNOSIS — I25118 Atherosclerotic heart disease of native coronary artery with other forms of angina pectoris: Secondary | ICD-10-CM | POA: Diagnosis not present

## 2021-05-24 DIAGNOSIS — E785 Hyperlipidemia, unspecified: Secondary | ICD-10-CM | POA: Diagnosis not present

## 2021-06-10 ENCOUNTER — Telehealth: Payer: Self-pay

## 2021-06-10 NOTE — Telephone Encounter (Signed)
Patient called and needs a rx for CPAP mask.  He said he need this today and I told him Dr [name] is not in the clinic and not sure this could be done today.  I told him you will follow up but may be tomorrow.

## 2021-06-11 ENCOUNTER — Telehealth: Payer: Self-pay | Admitting: Family Medicine

## 2021-06-11 ENCOUNTER — Other Ambulatory Visit: Payer: Self-pay

## 2021-06-11 DIAGNOSIS — Z9989 Dependence on other enabling machines and devices: Secondary | ICD-10-CM

## 2021-06-11 MED ORDER — UNABLE TO FIND: 0 refills | Status: AC

## 2021-06-11 NOTE — Telephone Encounter (Signed)
Cpap mask rx sent to [facility]

## 2021-06-11 NOTE — Telephone Encounter (Signed)
[name] is calling she states that the CPAP, Mask and tubing---is invalid --please resend or call them

## 2021-06-12 NOTE — Telephone Encounter (Signed)
resent

## 2021-06-20 ENCOUNTER — Ambulatory Visit (INDEPENDENT_AMBULATORY_CARE_PROVIDER_SITE_OTHER): Payer: Medicare HMO | Admitting: Family Medicine

## 2021-06-20 ENCOUNTER — Other Ambulatory Visit: Payer: Self-pay

## 2021-06-20 ENCOUNTER — Encounter: Payer: Self-pay | Admitting: Family Medicine

## 2021-06-20 ENCOUNTER — Encounter (INDEPENDENT_AMBULATORY_CARE_PROVIDER_SITE_OTHER): Payer: Self-pay

## 2021-06-20 VITALS — BP 155/70 | HR 82 | Ht 70.0 in | Wt 265.0 lb

## 2021-06-20 DIAGNOSIS — R0902 Hypoxemia: Secondary | ICD-10-CM | POA: Diagnosis not present

## 2021-06-20 DIAGNOSIS — Z23 Encounter for immunization: Secondary | ICD-10-CM | POA: Diagnosis not present

## 2021-06-20 DIAGNOSIS — I10 Essential (primary) hypertension: Secondary | ICD-10-CM | POA: Diagnosis not present

## 2021-06-20 DIAGNOSIS — M25561 Pain in right knee: Secondary | ICD-10-CM

## 2021-06-20 DIAGNOSIS — E559 Vitamin D deficiency, unspecified: Secondary | ICD-10-CM

## 2021-06-20 DIAGNOSIS — E1165 Type 2 diabetes mellitus with hyperglycemia: Secondary | ICD-10-CM | POA: Diagnosis not present

## 2021-06-20 DIAGNOSIS — J9611 Chronic respiratory failure with hypoxia: Secondary | ICD-10-CM | POA: Diagnosis not present

## 2021-06-20 DIAGNOSIS — G8929 Other chronic pain: Secondary | ICD-10-CM

## 2021-06-20 DIAGNOSIS — I639 Cerebral infarction, unspecified: Secondary | ICD-10-CM

## 2021-06-20 DIAGNOSIS — E039 Hypothyroidism, unspecified: Secondary | ICD-10-CM

## 2021-06-20 DIAGNOSIS — E782 Mixed hyperlipidemia: Secondary | ICD-10-CM | POA: Diagnosis not present

## 2021-06-20 DIAGNOSIS — L408 Other psoriasis: Secondary | ICD-10-CM

## 2021-06-20 DIAGNOSIS — E790 Hyperuricemia without signs of inflammatory arthritis and tophaceous disease: Secondary | ICD-10-CM | POA: Diagnosis not present

## 2021-06-20 NOTE — Patient Instructions (Addendum)
Annual exam in April, call if you need me sooner  Flu vaccine today  Lipid, cmp and EGFR, uric acid  level cBC and vit D and TSH today  You will be referred to Dermatology, Podiatry and to Dr Aline Brochure re right knee  Careful not to fall  We will request supplemental oxygen 24/7 and lightweight portable oxygen tank for you  Thanks for choosing [facility], we consider it a privelige to serve you.

## 2021-06-20 NOTE — Progress Notes (Signed)
[name]     MRN: [id number]      DOB: 11/18/1940   HPI [name] is here for follow up and re-evaluation of chronic medical conditions, medication management and review of any available recent lab and radiology data.  Preventive health is updated, specifically  Cancer screening and Immunization.   Questions or concerns regarding consultations or procedures which the PT has had in the interim are  addressed. The PT denies any adverse reactions to current medications since the last visit.  C/o increased right knee pain and swelling with instability progressively worsening C/o shortness of breath and poor exercise tolerance, requesting lightweight portable oxygen  Denies polyuria, polydipsia, blurred vision , or hypoglycemic episodes.   ROS Denies recent fever or chills. Denies sinus pressure, nasal congestion, ear pain or sore throat. Denies chest congestion, productive cough or wheezing.c/o sOB Denies chest pains, palpitations and leg swelling Denies abdominal pain, nausea, vomiting,diarrhea or constipation.   Denies dysuria, frequency,  Chronic  limitation in mobility.needs assistive device, no falls, chronic right sided weakness Denies headaches, seizures,  chronic right  numbness, g. Denies depression, anxiety or insomnia. Excess scaling of skin of feet and long toenails  PE  BP (!) 155/70    Pulse 82    Ht 5\' 10"  (1.778 m)    Wt 265 lb 0.6 oz (120.2 kg)    SpO2 (!) 87%    BMI 38.03 kg/m   Patient alert and oriented .  HEENT: No facial asymmetry, EOMI,     Neck decreased ROM  Chest: Clear to auscultation bilaterally.Decreased air entry throughout  CVS: S1, S2 , no S3.Regular rate.  ABD: Soft non tender.   Ext: No edema  MS: Markedly decreased  ROM spine, shoulders, hips and knees.  Skin: Intact, no ulcerations or rash noted.  Psych: Good eye contact, normal affect.  not anxious or depressed appearing.  CNS: CN 2-12 intact,  grade 4 power, in right upper  and lower extremities  Assessment & Plan  Uncontrolled type 2 diabetes mellitus with hyperglycemia ([place]) Managed by [name] is reminded of the importance of commitment to daily physical activity for 30 minutes or more, as able and the need to limit carbohydrate intake to 30 to 60 grams per meal to help with blood sugar control.   The need to take medication as prescribed, test blood sugar as directed, and to call between visits if there is a concern that blood sugar is uncontrolled is also discussed.   [name] is reminded of the importance of daily foot exam, annual eye examination, and good blood sugar, blood pressure and cholesterol control.  Diabetic Labs Latest Ref Rng & Units 01/28/2021 01/22/2021 01/04/2021 09/24/2020 09/14/2020  HbA1c 4.0 - 5.6 % 6.6(A) - - 7.5(A) -  Microalbumin mg/dL - - - - -  Micro/Creat Ratio 0 - 29 mg/g creat - - 90(H) - -  Chol 100 - 199 mg/dL - - 132 - 110  HDL >39 mg/dL - - 26(L) - 24(L)  Calc LDL 0 - 99 mg/dL - - 76 - 59  Triglycerides 0 - 149 mg/dL - - 173(H) - 152(H)  Creatinine 0.76 - 1.27 mg/dL - 1.26 1.37(H) - -   BP/Weight 06/20/2021 01/28/2021 01/04/2021 09/24/2020 08/08/2020 0/30/0923 3/0/0762  Systolic BP 263 335 456 256 - 389 373  Diastolic BP 70 66 80 78 - 62 77  Wt. (Lbs) 265.04 263.4 261 263 253 262 262  BMI 38.03 36.74 36.4 37.2 36.3  37.59 37.06   Foot/eye exam completion dates Latest Ref Rng & Units 06/20/2021 01/10/2021  Eye Exam No Retinopathy - No Retinopathy  Foot exam Order - - -  Foot Form Completion - Done -        Mixed hyperlipidemia Hyperlipidemia:Low fat diet discussed and encouraged. Uncontrolled Updated lab needed needed    Lipid Panel  Lab Results  Component Value Date   CHOL 132 01/04/2021   HDL 26 (L) 01/04/2021   LDLCALC 76 01/04/2021   LDLDIRECT 50 08/18/2014   TRIG 173 (H) 01/04/2021   CHOLHDL 5.1 (H) 01/04/2021       Hypoxia Oxygen is 87% on room air at rest, needs oxygen 24/7 and special  request for lightweight portable tank  Chronic respiratory failure with hypoxia (HCC) Requires supplemental oxygen 24/7, hypoxic at rest  PSORIASIS Excessive scaling of skin of feet, Dermatology to eval and treat  Right knee pain Increased pain and reduced mobility, refer to Ortho  Stroke (HCC) Chronic right hemiparesis unchanged  Essential hypertension Uncontrolled,m no med change at this visit DASH diet and commitment to daily physical activity for a minimum of 30 minutes discussed and encouraged, as a part of hypertension management. The importance of attaining a healthy weight is also discussed.  BP/Weight 06/20/2021 01/28/2021 01/04/2021 09/24/2020 08/08/2020 0/86/5784 10/25/6293  Systolic BP 284 132 440 102 - 725 366  Diastolic BP 70 66 80 78 - 62 77  Wt. (Lbs) 265.04 263.4 261 263 253 262 262  BMI 38.03 36.74 36.4 37.2 36.3 37.59 37.06

## 2021-06-21 ENCOUNTER — Other Ambulatory Visit: Payer: Self-pay | Admitting: Family Medicine

## 2021-06-21 DIAGNOSIS — J449 Chronic obstructive pulmonary disease, unspecified: Secondary | ICD-10-CM

## 2021-06-24 ENCOUNTER — Encounter: Payer: Self-pay | Admitting: Family Medicine

## 2021-06-24 DIAGNOSIS — M25561 Pain in right knee: Secondary | ICD-10-CM | POA: Insufficient documentation

## 2021-06-24 NOTE — Assessment & Plan Note (Signed)
Increased pain and reduced mobility, refer to Ortho

## 2021-06-24 NOTE — Assessment & Plan Note (Addendum)
Hyperlipidemia:Low fat diet discussed and encouraged. Uncontrolled Updated lab needed needed    Lipid Panel  Lab Results  Component Value Date   CHOL 132 01/04/2021   HDL 26 (L) 01/04/2021   LDLCALC 76 01/04/2021   LDLDIRECT 50 08/18/2014   TRIG 173 (H) 01/04/2021   CHOLHDL 5.1 (H) 01/04/2021

## 2021-06-24 NOTE — Assessment & Plan Note (Signed)
Managed by [name] is reminded of the importance of commitment to daily physical activity for 30 minutes or more, as able and the need to limit carbohydrate intake to 30 to 60 grams per meal to help with blood sugar control.   The need to take medication as prescribed, test blood sugar as directed, and to call between visits if there is a concern that blood sugar is uncontrolled is also discussed.   [name] is reminded of the importance of daily foot exam, annual eye examination, and good blood sugar, blood pressure and cholesterol control.  Diabetic Labs Latest Ref Rng & Units 01/28/2021 01/22/2021 01/04/2021 09/24/2020 09/14/2020  HbA1c 4.0 - 5.6 % 6.6(A) - - 7.5(A) -  Microalbumin mg/dL - - - - -  Micro/Creat Ratio 0 - 29 mg/g creat - - 90(H) - -  Chol 100 - 199 mg/dL - - 132 - 110  HDL >39 mg/dL - - 26(L) - 24(L)  Calc LDL 0 - 99 mg/dL - - 76 - 59  Triglycerides 0 - 149 mg/dL - - 173(H) - 152(H)  Creatinine 0.76 - 1.27 mg/dL - 1.26 1.37(H) - -   BP/Weight 06/20/2021 01/28/2021 01/04/2021 09/24/2020 08/08/2020 09/07/310 12/17/1884  Systolic BP 773 736 681 594 - 707 615  Diastolic BP 70 66 80 78 - 62 77  Wt. (Lbs) 265.04 263.4 261 263 253 262 262  BMI 38.03 36.74 36.4 37.2 36.3 37.59 37.06   Foot/eye exam completion dates Latest Ref Rng & Units 06/20/2021 01/10/2021  Eye Exam No Retinopathy - No Retinopathy  Foot exam Order - - -  Foot Form Completion - Done -

## 2021-06-24 NOTE — Assessment & Plan Note (Signed)
Uncontrolled,m no med change at this visit DASH diet and commitment to daily physical activity for a minimum of 30 minutes discussed and encouraged, as a part of hypertension management. The importance of attaining a healthy weight is also discussed.  BP/Weight 06/20/2021 01/28/2021 01/04/2021 09/24/2020 08/08/2020 12/05/7216 06/27/8335  Systolic BP 445 146 047 998 - 721 587  Diastolic BP 70 66 80 78 - 62 77  Wt. (Lbs) 265.04 263.4 261 263 253 262 262  BMI 38.03 36.74 36.4 37.2 36.3 37.59 37.06

## 2021-06-24 NOTE — Assessment & Plan Note (Addendum)
Requires supplemental oxygen 24/7, hypoxic at rest

## 2021-06-24 NOTE — Assessment & Plan Note (Signed)
Chronic right hemiparesis unchanged

## 2021-06-24 NOTE — Assessment & Plan Note (Signed)
Excessive scaling of skin of feet, Dermatology to eval and treat

## 2021-06-24 NOTE — Assessment & Plan Note (Signed)
Oxygen is 87% on room air at rest, needs oxygen 24/7 and special request for lightweight portable tank

## 2021-07-04 ENCOUNTER — Other Ambulatory Visit: Payer: Self-pay

## 2021-07-04 ENCOUNTER — Ambulatory Visit: Payer: Medicare HMO | Admitting: Orthopedic Surgery

## 2021-07-04 ENCOUNTER — Ambulatory Visit: Payer: Medicare HMO

## 2021-07-04 ENCOUNTER — Encounter: Payer: Self-pay | Admitting: Orthopedic Surgery

## 2021-07-04 ENCOUNTER — Other Ambulatory Visit: Payer: Self-pay | Admitting: Orthopedic Surgery

## 2021-07-04 VITALS — BP 155/81 | HR 69 | Ht 70.0 in | Wt 270.0 lb

## 2021-07-04 DIAGNOSIS — G8929 Other chronic pain: Secondary | ICD-10-CM

## 2021-07-04 DIAGNOSIS — M25561 Pain in right knee: Secondary | ICD-10-CM

## 2021-07-04 DIAGNOSIS — M1711 Unilateral primary osteoarthritis, right knee: Secondary | ICD-10-CM | POA: Diagnosis not present

## 2021-07-04 NOTE — Progress Notes (Addendum)
Chief Complaint  Patient presents with   Knee Pain    Rt knee pain worse after stroke in '17. Also having leg swelling and falls.    [name] is 81 years old on Plavix comes in today for evaluation of right knee pain which became worse after her stroke in 2017  He also has frequent falls  Complains of some right-sided hip pain radiating to his right knee  He has a history of morbid obesity essential hypertension coronary artery disease COPD with hypoxia type 2 diabetes history of stroke cerebral infarction right hemiparesis back pain chronic kidney disease hypothyroidism and history of respiratory failure  Complaints of dull aching pain right knee although he says he has no sensation in his right leg he does feel pain there  System review positive findings are chest pain flank plain hematuria joint pain frequent falls   Past Medical History:  Diagnosis Date   Abnormality of gait 05/01/2015   Anemia 04/19/2015   Annual physical exam 12/11/2013   Arthritis    "left leg; knees" (05/07/2017)   Arthritis of knee    CAD (coronary artery disease)    CHF (congestive heart failure) ([place])    Phreesia 08/08/2020   Choking 07/13/2015   Chronic bronchitis ([name])    "get it q yr"   Chronic lower back pain    "since I was a teen" (05/07/2017)   COPD (chronic obstructive pulmonary disease) ([place])    CVA (cerebral vascular accident) ([place]) 05/2015   R sided weakness (05/07/2017)   Diabetes mellitus without complication ([place])    Phreesia 08/08/2020   GERD (gastroesophageal reflux disease)    Gout    History of colonic polyps 04/03/2017   Added automatically from request for surgery [id number]   Hyperlipidemia    Hypertension    Myocardial infarction ([place]) 1999   Obesity    OSA on CPAP    Oxygen deficiency    Phreesia 08/08/2020   Pneumonia    "a few times; last time was ~ 06/2013" (05/07/2017)   Psoriasis    Stroke ([place])    Phreesia 08/08/2020   Thyroid disease    Phreesia 08/08/2020    Tussive syncope 05/01/2015   Type II diabetes mellitus ([place])    Vitamin B12 deficiency 05/01/2015   Past Surgical History:  Procedure Laterality Date   APPENDECTOMY     CATARACT EXTRACTION W/PHACO Left 10/07/2016   Procedure: CATARACT EXTRACTION PHACO AND INTRAOCULAR LENS PLACEMENT ([place]);  Surgeon: Rutherford Guys, MD;  Location: [facility];  Service: Ophthalmology;  Laterality: Left;  CDE: 13.49   CATARACT EXTRACTION W/PHACO Right 10/28/2016   Procedure: CATARACT EXTRACTION PHACO AND INTRAOCULAR LENS PLACEMENT ([facility]);  Surgeon: Rutherford Guys, MD;  Location: [facility];  Service: Ophthalmology;  Laterality: Right;  CDE: 16.71   COLONOSCOPY  03/17/2012   Procedure: COLONOSCOPY;  Surgeon: Rogene Houston, MD;  Location: [facility];  Service: Endoscopy;  Laterality: N/A;  830   COLONOSCOPY N/A 06/25/2017   Procedure: COLONOSCOPY;  Surgeon: Rogene Houston, MD;  [facility];  Service: Endoscopy;  Laterality: N/A;  [place]- 2009-08/30/2013   "counting today's, I have 5 stents" (08/30/2013)   CORONARY ANGIOPLASTY WITH STENT PLACEMENT  05/07/2017   CORONARY STENT INTERVENTION N/A 05/07/2017   Procedure: CORONARY STENT INTERVENTION;  Surgeon: Charolette Forward, MD;  Location: [facility];  Service: Cardiovascular;  Laterality: N/A;   CYSTOSCOPY WITH URETHRAL DILATATION N/A 11/26/2012  Procedure: CYSTOSCOPY WITH URETHRAL DILATATION;  Surgeon: Malka So, MD;  Location: [facility];  Service: Urology;  Laterality: N/A;   LEFT HEART CATH AND CORONARY ANGIOGRAPHY N/A 05/07/2017   Procedure: LEFT HEART CATH AND CORONARY ANGIOGRAPHY;  Surgeon: Charolette Forward, MD;  Location: [facility];  Service: Cardiovascular;  Laterality: N/A;   LEFT HEART CATHETERIZATION WITH CORONARY ANGIOGRAM N/A 08/30/2013   Procedure: LEFT HEART CATHETERIZATION WITH CORONARY ANGIOGRAM;  Surgeon: Clent Demark, MD;  Location: [facility];  Service: Cardiovascular;  Laterality:  N/A;   PERCUTANEOUS CORONARY STENT INTERVENTION (PCI-S) Right 08/30/2013   Procedure: PERCUTANEOUS CORONARY STENT INTERVENTION (PCI-S);  Surgeon: Clent Demark, MD;  Location: [facility];  Service: Cardiovascular;  Laterality: Right;   BP (!) 155/81    Pulse 69    Ht 5\' 10"  (1.778 m)    Wt 270 lb (122.5 kg)    BMI 38.74 kg/m   Physical Exam Constitutional:      Appearance: He is normal weight.  Musculoskeletal:     Comments: Right knee tenderness over the medial compartment with varus alignment skin is intact he still has a functional range of motion with 120 degrees of flexion the knee is stable in all planes muscle tone is normal  Skin:    General: Skin is warm.  Neurological:     Mental Status: He is alert and oriented to person, place, and time.  Psychiatric:        Mood and Affect: Mood normal.        Behavior: Behavior normal.        Thought Content: Thought content normal.   He walks with assist device   Imaging of the right knee reveal severe arthritis with moderate varus deformity of the right knee mostly arthritis is in the medial compartment  Assessment and plan 81 year old male with multiple medical problems as listed  Not surgical candidate at this time  Recommend bracing and injection which she agreed to  Procedure note right knee injection   verbal consent was obtained to inject right knee joint  Timeout was completed to confirm the site of injection  The medications used were depomedrol 40 mg and 1% lidocaine 3 cc Anesthesia was provided by ethyl chloride and the skin was prepped with alcohol.  After cleaning the skin with alcohol a 20-gauge needle was used to inject the right knee joint. There were no complications. A sterile bandage was applied.

## 2021-07-04 NOTE — Patient Instructions (Signed)
You can get an injection once every 4 months

## 2021-07-29 ENCOUNTER — Other Ambulatory Visit: Payer: Self-pay | Admitting: Nurse Practitioner

## 2021-07-29 ENCOUNTER — Other Ambulatory Visit: Payer: Self-pay

## 2021-07-29 ENCOUNTER — Ambulatory Visit: Payer: Medicare HMO | Admitting: Nurse Practitioner

## 2021-07-29 ENCOUNTER — Encounter: Payer: Self-pay | Admitting: Nurse Practitioner

## 2021-07-29 VITALS — BP 157/78 | HR 64 | Ht 70.0 in | Wt 269.0 lb

## 2021-07-29 DIAGNOSIS — I10 Essential (primary) hypertension: Secondary | ICD-10-CM | POA: Diagnosis not present

## 2021-07-29 DIAGNOSIS — E038 Other specified hypothyroidism: Secondary | ICD-10-CM | POA: Diagnosis not present

## 2021-07-29 DIAGNOSIS — E559 Vitamin D deficiency, unspecified: Secondary | ICD-10-CM

## 2021-07-29 DIAGNOSIS — E782 Mixed hyperlipidemia: Secondary | ICD-10-CM

## 2021-07-29 DIAGNOSIS — E1165 Type 2 diabetes mellitus with hyperglycemia: Secondary | ICD-10-CM

## 2021-07-29 DIAGNOSIS — E1122 Type 2 diabetes mellitus with diabetic chronic kidney disease: Secondary | ICD-10-CM | POA: Diagnosis not present

## 2021-07-29 DIAGNOSIS — E1142 Type 2 diabetes mellitus with diabetic polyneuropathy: Secondary | ICD-10-CM | POA: Diagnosis not present

## 2021-07-29 LAB — POCT GLYCOSYLATED HEMOGLOBIN (HGB A1C): HbA1c, POC (controlled diabetic range): 7 % (ref 0.0–7.0)

## 2021-07-29 NOTE — Patient Instructions (Signed)
Diabetes Mellitus Emergency Preparedness Plan ?A diabetes emergency preparedness plan is a checklist to make sure you have everything you need to manage your diabetes in case of an emergency, such as an evacuation, natural disaster, national security emergency, or pandemic lockdown. ?Managing your diabetes is something you have to do all day every day. The American Diabetes Association and the American College of Endocrinology both recommend putting together an emergency diabetes kit. Your kit should include important information and documents as well as all the supplies you will need to manage your diabetes for at least 1 week. Store it in a portable, waterproof bag or container. The best time to start making your emergency kit is now. ?How to make your emergency kit ?Collect information and documents ?Include the following information and documents in your kit: ?The type of diabetes you have. ?A copy of your health insurance cards and photo ID. ?A list of all your other medical conditions, allergies, and surgeries. ?A list of all your medicines and doses with the contact information for your pharmacy. Ask your health care provider for a list of your current medicines. ?Any recent lab results, including your latest hemoglobin A1C (HbA1C). ?The make, model, and serial number of your insulin pump, if you use one. Also include contact information for the manufacturer. ?Contact information for people who should be notified in case of an emergency. Include your health care provider's name, address, and phone number. ?Collect diabetes care items ?Include the following diabetes care items in your kit: ?At least a 1-week supply of: ?Oral medicines. ?Insulin. ?Blood glucose testing supplies. These include testing strips, lancets, and extra batteries for your blood glucose monitor and pump. ?A charger for the continuous glucose monitor (CGM) receiver and pump. ?Any extra supplies needed for your CGM or pump. ?A supply of  glucagon, glucose tablets, juice, soda, or hard candy in case of hypoglycemia. ?Coolers or cold packs. ?A safe container for syringes, needles, and lancets. ? ?Other preparations ?Other things to consider doing as part of your emergency plan: ?Make sure that your mobile phone is charged and that you have an extra charger, cable, or batteries. ?Choose a meeting place for family members. ?Wear a medical alert or ID bracelet. ?If you have a child with diabetes, make sure your child's school has a copy of his or her emergency plan, including the name of the staff member who will assist your child. ?Where to find more information ?American Diabetes Association: www.diabetes.org ?Centers for Disease Control and Prevention: blogs.cdc.gov ?Summary ?A diabetes emergency preparedness plan is a checklist to make sure you have everything you need in case of an emergency. ?Your kit should include important information and documents as well as all the supplies you will need to manage your condition for at least 1 week. ?Store your kit in a portable, waterproof bag or container. ?The best time to start making your emergency kit is now. ?This information is not intended to replace advice given to you by your health care provider. Make sure you discuss any questions you have with your health care provider. ?Document Revised: 11/10/2019 Document Reviewed: 11/10/2019 ?Elsevier Patient Education ? 2022 [employer] ? ?

## 2021-07-29 NOTE — Progress Notes (Signed)
07/29/2021  Endocrinology follow-up note  Subjective:    Patient ID: [name], male    DOB: 19-Jun-1940, PCP Fayrene Helper, MD   Past Medical History:  Diagnosis Date   Abnormality of gait 05/01/2015   Anemia 04/19/2015   Annual physical exam 12/11/2013   Arthritis    "left leg; knees" (05/07/2017)   Arthritis of knee    CAD (coronary artery disease)    CHF (congestive heart failure) ([place])    Phreesia 08/08/2020   Choking 07/13/2015   Chronic bronchitis ([place])    "get it q yr"   Chronic lower back pain    "since I was a teen" (05/07/2017)   COPD (chronic obstructive pulmonary disease) ([place])    CVA (cerebral vascular accident) ([place]) 05/2015   R sided weakness (05/07/2017)   Diabetes mellitus without complication ([place])    Phreesia 08/08/2020   GERD (gastroesophageal reflux disease)    Gout    History of colonic polyps 04/03/2017   Added automatically from request for surgery [id number]   Hyperlipidemia    Hypertension    Myocardial infarction ([place]) 1999   Obesity    OSA on CPAP    Oxygen deficiency    Phreesia 08/08/2020   Pneumonia    "a few times; last time was ~ 06/2013" (05/07/2017)   Psoriasis    Stroke ([place])    Phreesia 08/08/2020   Thyroid disease    Phreesia 08/08/2020   Tussive syncope 05/01/2015   Type II diabetes mellitus ([place])    Vitamin B12 deficiency 05/01/2015   Past Surgical History:  Procedure Laterality Date   APPENDECTOMY     CATARACT EXTRACTION W/PHACO Left 10/07/2016   Procedure: CATARACT EXTRACTION PHACO AND INTRAOCULAR LENS PLACEMENT ([place]);  Surgeon: Rutherford Guys, MD;  Location: [facility];  Service: Ophthalmology;  Laterality: Left;  CDE: 13.49   CATARACT EXTRACTION W/PHACO Right 10/28/2016   Procedure: CATARACT EXTRACTION PHACO AND INTRAOCULAR LENS PLACEMENT ([facility]);  Surgeon: Rutherford Guys, MD;  Location: [facility];  Service: Ophthalmology;  Laterality: Right;  CDE: 16.71   COLONOSCOPY  03/17/2012   Procedure: COLONOSCOPY;  Surgeon: Rogene Houston, MD;  Location: [facility];  Service: Endoscopy;  Laterality: N/A;  830   COLONOSCOPY N/A 06/25/2017   Procedure: COLONOSCOPY;  Surgeon: Rogene Houston, MD;  [facility];  Service: Endoscopy;  Laterality: N/A;  [place]- 2009-08/30/2013   "counting today's, I have 5 stents" (08/30/2013)   CORONARY ANGIOPLASTY WITH STENT PLACEMENT  05/07/2017   CORONARY STENT INTERVENTION N/A 05/07/2017   Procedure: CORONARY STENT INTERVENTION;  Surgeon: Charolette Forward, MD;  Location: [facility];  Service: Cardiovascular;  Laterality: N/A;   CYSTOSCOPY WITH URETHRAL DILATATION N/A 11/26/2012   Procedure: CYSTOSCOPY WITH URETHRAL DILATATION;  Surgeon: Malka So, MD;  Location: [facility];  Service: Urology;  Laterality: N/A;   LEFT HEART CATH AND CORONARY ANGIOGRAPHY N/A 05/07/2017   Procedure: LEFT HEART CATH AND CORONARY ANGIOGRAPHY;  Surgeon: Charolette Forward, MD;  Location: [facility];  Service: Cardiovascular;  Laterality: N/A;   LEFT HEART CATHETERIZATION WITH CORONARY ANGIOGRAM N/A 08/30/2013   Procedure: LEFT HEART CATHETERIZATION WITH CORONARY ANGIOGRAM;  Surgeon: Clent Demark, MD;  Location: [facility];  Service: Cardiovascular;  Laterality: N/A;   PERCUTANEOUS CORONARY STENT INTERVENTION (PCI-S) Right 08/30/2013   Procedure: PERCUTANEOUS CORONARY STENT INTERVENTION (PCI-S);  Surgeon: Clent Demark, MD;  Location: [facility] C[facility] LAB;  Service: Cardiovascular;  Laterality: Right;   Social History   Socioeconomic History   Marital status: Married    Spouse name: Not on file   Number of children: 2   Years of education: 11   Highest education level: 12th grade  Occupational History   Occupation: Brewing technologist     Comment: retired   Tobacco Use   Smoking status: Former    Packs/day: 0.50    Years: 33.00    Pack years: 16.50    Types: Cigarettes    Quit date: 11/29/2002    Years since quitting: 18.6   Smokeless  tobacco: Never  Vaping Use   Vaping Use: Never used  Substance and Sexual Activity   Alcohol use: No   Drug use: No   Sexual activity: Not Currently  Other Topics Concern   Not on file  Social History Narrative   Patient drinks about 3 cups of soda daily.    Patient is left handed.    Lives alone with wife    Social Determinants of Health   Financial Resource Strain: Low Risk    Difficulty of Paying Living Expenses: Not hard at all  Food Insecurity: Not on file  Transportation Needs: No Transportation Needs   Lack of Transportation (Medical): No   Lack of Transportation (Non-Medical): No  Physical Activity: Inactive   Days of Exercise per Week: 0 days   Minutes of Exercise per Session: 0 min  Stress: Not on file  Social Connections: Moderately Integrated   Frequency of Communication with Friends and Family: Three times a week   Frequency of Social Gatherings with Friends and Family: Three times a week   Attends Religious Services: 1 to 4 times per year   Active Member of Clubs or Organizations: No   Attends Archivist Meetings: Never   Marital Status: Married   Outpatient Encounter Medications as of 07/29/2021  Medication Sig   acetaminophen (TYLENOL) 500 MG tablet Take 1,000 mg by mouth every 6 (six) hours as needed for moderate pain (left thigh).    albuterol (VENTOLIN HFA) 108 (90 Base) MCG/ACT inhaler INHALE 1-2 PUFFS INTO THE LUNGS EVERY 6 HOURS AS NEEDED FOR WHEEZING OR SHORTNESS OF BREATH.   allopurinol (ZYLOPRIM) 300 MG tablet TAKE 1 TABLET (300 MG TOTAL) BY MOUTH DAILY.   aspirin EC 81 MG tablet Take 81 mg by mouth daily.   Blood Glucose Calibration (TRUE METRIX LEVEL 1) Low SOLN Use as directed   Blood Glucose Monitoring Suppl (TRUE METRIX METER) w/Device KIT USE AS DIRECTED   budesonide-formoterol (SYMBICORT) 80-4.5 MCG/ACT inhaler Inhale 2 puffs into the lungs daily.   clopidogrel (PLAVIX) 75 MG tablet TAKE 1 TABLET EVERY DAY   DROPLET INSULIN SYRINGE  31G X 5/16" 1 ML MISC USE AS DIRECTED IN THE MORNING AND AT BEDTIME   ezetimibe (ZETIA) 10 MG tablet Take 1 tablet (10 mg total) by mouth daily.   glucose blood (TRUE METRIX BLOOD GLUCOSE TEST) test strip 4 times daily testing dx e11.65   isosorbide mononitrate (IMDUR) 30 MG 24 hr tablet TAKE 1 TABLET EVERY DAY   levETIRAcetam (KEPPRA) 500 MG tablet TAKE 1 TABLET TWICE DAILY   levothyroxine (SYNTHROID) 100 MCG tablet TAKE 1 TABLET (100 MCG TOTAL) BY MOUTH DAILY BEFORE BREAKFAST.   metoprolol tartrate (LOPRESSOR) 50 MG tablet TAKE 1/2 TABLET TWICE DAILY   nitroGLYCERIN (NITROSTAT) 0.4 MG SL tablet Place 1 tablet (0.4 mg total) under the tongue every 5 (five) minutes as needed for  chest pain.   NOVOLIN 70/30 (70-30) 100 UNIT/ML injection INJECT 55 UNITS SUBCUTANEOUSLY WITH BREAKFAST AND 45 UNITS WITH SUPPER.   pantoprazole (PROTONIX) 40 MG tablet TAKE 1 TABLET EVERY DAY BEFORE BREAKFAST   polyethylene glycol (MIRALAX / GLYCOLAX) 17 g packet Take 17 g by mouth daily as needed.   pravastatin (PRAVACHOL) 40 MG tablet TAKE 2 TABLETS EVERY DAY   spironolactone (ALDACTONE) 50 MG tablet Take 1 tablet (50 mg total) by mouth daily.   TRUEplus Lancets 33G MISC TEST BLOOD SUGAR FOUR TIMES DAILY   UNABLE TO FIND CPAP mask and tubing  DX G47.33   vitamin B-12 (CYANOCOBALAMIN) 1000 MCG tablet Take 1,000 mcg by mouth daily.   Vitamin D, Ergocalciferol, (DRISDOL) 1.25 MG (50000 UNIT) CAPS capsule Take by mouth.   No facility-administered encounter medications on file as of 07/29/2021.   ALLERGIES: No Known Allergies VACCINATION STATUS: Immunization History  Administered Date(s) Administered   Fluad Quad(high Dose 65+) 01/31/2019, 03/01/2020, 06/20/2021   Influenza Whole 03/18/2005   Influenza, High Dose Seasonal PF 02/22/2018   Influenza,inj,Quad PF,6+ Mos 03/19/2017   PFIZER(Purple Top)SARS-COV-2 Vaccination 06/25/2019, 07/16/2019, 04/22/2020   Pneumococcal Conjugate-13 12/08/2013   Pneumococcal  Polysaccharide-23 06/09/2012   Td 12/04/2003   Tdap 03/06/2017    Diabetes He presents for his follow-up diabetic visit. He has type 2 diabetes mellitus. Onset time: He was diagnosed at approximate age of 50 years. His disease course has been stable. There are no hypoglycemic associated symptoms. Pertinent negatives for hypoglycemia include no confusion, headaches, nervousness/anxiousness, pallor, seizures or tremors. Pertinent negatives for diabetes include no chest pain, no fatigue, no polydipsia, no polyphagia, no polyuria, no weakness and no weight loss. There are no hypoglycemic complications. Symptoms are stable. Diabetic complications include a CVA, heart disease, nephropathy and peripheral neuropathy. Risk factors for coronary artery disease include dyslipidemia, diabetes mellitus, male sex, obesity, tobacco exposure, sedentary lifestyle and hypertension. Current diabetic treatment includes insulin injections. He is compliant with treatment most of the time. His weight is fluctuating minimally. He is following a generally healthy diet. When asked about meal planning, he reported none. He has not had a previous visit with a dietitian. He rarely participates in exercise. His home blood glucose trend is fluctuating minimally. His breakfast blood glucose range is generally 110-130 mg/dl. His dinner blood glucose range is generally 110-130 mg/dl. (He presents today with his logs, no meter, showing at target glycemic profile overall.  His POCT A1c today is 7%, increasing slightly from last visit of 6.8%.  He did have steroid shot in his knee which temporarily caused higher than usual readings.  He denies any significant hypoglycemia.) An ACE inhibitor/angiotensin II receptor blocker is not being taken. He does not see a podiatrist.Eye exam is current.  Hyperlipidemia This is a chronic problem. The current episode started more than 1 year ago. The problem is uncontrolled. Recent lipid tests were reviewed  and are variable (elevated triglycerides). Exacerbating diseases include chronic renal disease, diabetes, hypothyroidism and obesity. Factors aggravating his hyperlipidemia include beta blockers and fatty foods. Associated symptoms include shortness of breath. Pertinent negatives include no chest pain or myalgias. Current antihyperlipidemic treatment includes statins. The current treatment provides moderate improvement of lipids. Compliance problems include adherence to exercise and adherence to diet (r/t decreased mobility from prior CVA).  Risk factors for coronary artery disease include diabetes mellitus, dyslipidemia, hypertension, male sex, a sedentary lifestyle and obesity.  Hypertension This is a chronic problem. The current episode started more than 1 year  ago. The problem is unchanged. The problem is controlled. Associated symptoms include shortness of breath. Pertinent negatives include no chest pain, headaches, neck pain or palpitations. Agents associated with hypertension include thyroid hormones. Risk factors for coronary artery disease include dyslipidemia, diabetes mellitus, sedentary lifestyle, male gender and obesity. Past treatments include beta blockers and diuretics. The current treatment provides mild improvement. There are no compliance problems.  Hypertensive end-organ damage includes kidney disease, CAD/MI and CVA. Identifiable causes of hypertension include chronic renal disease and a thyroid problem.  Thyroid Problem Presents for follow-up visit. Patient reports no anxiety, cold intolerance, constipation, depressed mood, diarrhea, fatigue, heat intolerance, palpitations, tremors, weight gain or weight loss. The symptoms have been stable. Past treatments include levothyroxine. His past medical history is significant for diabetes and hyperlipidemia.    Review of systems  Constitutional: + Minimally fluctuating body weight,  current Body mass index is 38.6 kg/m. , no fatigue, no  subjective hyperthermia, no subjective hypothermia Eyes: no blurry vision, no xerophthalmia ENT: no sore throat, no nodules palpated in throat, no dysphagia/odynophagia, no hoarseness Cardiovascular: no chest pain, no shortness of breath, no palpitations, no leg swelling Respiratory: no cough, no shortness of breath Gastrointestinal: no nausea/vomiting/diarrhea Musculoskeletal: no muscle/joint aches, walks with platform walker due to mobility deficits from previous CVA Skin: no rashes, no hyperemia Neurological: no tremors, no tingling, no dizziness, numbness reported in RUE and RLE (from previous stroke) Psychiatric: no depression, no anxiety    Objective:    BP (!) 157/78    Pulse 64    Ht 5' 10" (1.778 m)    Wt 269 lb (122 kg)    BMI 38.60 kg/m   Wt Readings from Last 3 Encounters:  07/29/21 269 lb (122 kg)  07/04/21 270 lb (122.5 kg)  06/20/21 265 lb 0.6 oz (120.2 kg)     BP Readings from Last 3 Encounters:  07/29/21 (!) 157/78  07/04/21 (!) 155/81  06/20/21 (!) 155/70      Physical Exam- Limited  Constitutional:  Body mass index is 38.6 kg/m. , not in acute distress, normal state of mind Eyes:  EOMI, no exophthalmos Neck: Supple Cardiovascular: RRR, no murmurs, rubs, or gallops, no edema Respiratory: Adequate breathing efforts, no crackles, rales, rhonchi, or wheezing Musculoskeletal: no gross deformities, walks with walker due to previous CVA Skin:  no rashes, no hyperemia Neurological: no tremor with outstretched hands    CMP     Component Value Date/Time   NA 139 01/22/2021 1022   K 4.3 01/22/2021 1022   CL 104 01/22/2021 1022   CO2 21 01/22/2021 1022   GLUCOSE 96 01/22/2021 1022   GLUCOSE 192 (H) 02/16/2020 0838   BUN 17 01/22/2021 1022   CREATININE 1.26 01/22/2021 1022   CREATININE 1.12 02/16/2020 0838   CALCIUM 9.1 01/22/2021 1022   PROT 6.9 01/22/2021 1022   ALBUMIN 3.8 01/22/2021 1022   AST 11 01/22/2021 1022   ALT 7 01/22/2021 1022   ALKPHOS  71 01/22/2021 1022   BILITOT 0.3 01/22/2021 1022   GFRNONAA 46 (L) 06/20/2020 0907   GFRNONAA 63 02/16/2020 0838   GFRAA 53 (L) 06/20/2020 0907   GFRAA 73 02/16/2020 0838     Diabetic Labs (most recent): Lab Results  Component Value Date   HGBA1C 7.0 07/29/2021   HGBA1C 6.6 (A) 01/28/2021   HGBA1C 7.5 (A) 09/24/2020     Lipid Panel ( most recent) Lipid Panel     Component Value Date/Time   CHOL 132 01/04/2021  1042   TRIG 173 (H) 01/04/2021 1042   HDL 26 (L) 01/04/2021 1042   CHOLHDL 5.1 (H) 01/04/2021 1042   CHOLHDL 5.3 (H) 10/11/2019 0924   VLDL 35 04/20/2017 1130   LDLCALC 76 01/04/2021 1042   LDLCALC 67 10/11/2019 0924   LDLDIRECT 50 08/18/2014 0737      Assessment & Plan:   1) Type 2 diabetes mellitus with multiple competitions including diabetic nephropathy  -His diabetes is complicated by coronary artery disease, CVA, CKD and patient remains at a high risk for more acute and chronic complications of diabetes which include CAD, CVA, CKD, retinopathy, and neuropathy. These are all discussed in detail with the patient.  He presents today with his logs, no meter, showing at target glycemic profile overall.  His POCT A1c today is 7%, increasing slightly from last visit of 6.8%.  He did have steroid shot in his knee which temporarily caused higher than usual readings.  He denies any significant hypoglycemia.  - Recent labs reviewed.  - Nutritional counseling repeated at each appointment due to patients tendency to fall back in to old habits.  - The patient admits there is a room for improvement in their diet and drink choices. -  Suggestion is made for the patient to avoid simple carbohydrates from their diet including Cakes, Sweet Desserts / Pastries, Ice Cream, Soda (diet and regular), Sweet Tea, Candies, Chips, Cookies, Sweet Pastries, Store Bought Juices, Alcohol in Excess of 1-2 drinks a day, Artificial Sweeteners, Coffee Creamer, and "Sugar-free" Products. This  will help patient to have stable blood glucose profile and potentially avoid unintended weight gain.   - I encouraged the patient to switch to unprocessed or minimally processed complex starch and increased protein intake (animal or plant source), fruits, and vegetables.   - Patient is advised to stick to a routine mealtimes to eat 3 meals a day and avoid unnecessary snacks (to snack only to correct hypoglycemia).  - I have approached patient with the following individualized plan to manage diabetes and patient agrees.  - He has done reasonably very well on premixed Novolin 70/30. He wishes to stay on the same regimen for cost reasons.   - Given his stable, at goal, glycemic profile, he is advised to continue current regimen of Novolin 70/30 55 units with breakfast and 45 units with supper if glucose is above 90 and he is eating.    -He is advised to continue monitoring blood glucose at least 3 times per day, before injecting insulin at breakfast and supper, and before bed.  He is instructed to call the clinic if he has readings less than 70 or greater than 300 for 3 tests in a row.  -He is warned not to take insulin without proper monitoring per orders.  - Patient specific target  for A1c; LDL, HDL, Triglycerides, and  Waist Circumference were discussed in detail.  2) BP/HTN:  His blood pressure is controlled to target for his age.  He is advised to continue Metoprolol 25 mg po twice daily, Isosorbide 30 mg po daily, and Spironolactone 50 mg po daily.    3) Lipids/HPL:  His most recent lipid panel from 01/04/21 shows controlled LDL of 76 and elevated triglycerides of 173 (stable).  He is advised to continue Pravastatin 40 mg po daily at bedtime.  Side effects and precautions discussed with him.  He is advised to avoid fried foods and butter.  Will recheck lipid panel prior to next visit.  4)  Weight/Diet:  His Body mass index is 38.6 kg/m.-  He is a candidate for modest weight loss.  He  cannot exercise optimally, limited by pulmonary sufficiency due to COPD and previous CVA.   CDE consult in progress, exercise, and carbohydrates information provided.  5) Hypothyroidism:  -There are no recent TFTs to review.  He is advised to continue current dose of Levothyroxine 100 mcg po daily before breakfast.  Will repeat TFTs prior to next visit and adjust dose if needed.   - We discussed about the correct intake of his thyroid hormone, on empty stomach at fasting, with water, separated by at least 30 minutes from breakfast and other medications,  and separated by more than 4 hours from calcium, iron, multivitamins, acid reflux medications (PPIs). -Patient is made aware of the fact that thyroid hormone replacement is needed for life, dose to be adjusted by periodic monitoring of thyroid function tests.  6) Vitamin D deficiency His most recent vitamin D level on 09/14/20 was 25.3.  He is not sure if he is still taking his supplement as his wife manages his medication.  He is advised to start OTC Vitamin D3 5000 units daily until next measurement.  7) Chronic Care/Health Maintenance: -Patient is on Statin medications and encouraged to continue to follow up with Ophthalmology, nephrology, Podiatrist at least yearly or according to recommendations, and advised to stay away from smoking. I have recommended yearly flu vaccine and pneumonia vaccination at least every 5 years;  and  sleep for at least 7 hours a day.   - I advised patient to maintain close follow up with Fayrene Helper, MD for primary care needs.     I spent 35 minutes in the care of the patient today including review of labs from [place], Lipids, Thyroid Function, Hematology (current and previous including abstractions from other facilities); face-to-face time discussing  his blood glucose readings/logs, discussing hypoglycemia and hyperglycemia episodes and symptoms, medications doses, his options of short and long term  treatment based on the latest standards of care / guidelines;  discussion about incorporating lifestyle medicine;  and documenting the encounter.    Please refer to Patient Instructions for Blood Glucose Monitoring and Insulin/Medications Dosing Guide"  in media tab for additional information. Please  also refer to " Patient Self Inventory" in the Media  tab for reviewed elements of pertinent patient history.  [name] participated in the discussions, expressed understanding, and voiced agreement with the above plans.  All questions were answered to his satisfaction. he is encouraged to contact clinic should he have any questions or concerns prior to his return visit.   Follow up plan: -Return in about 6 months (around 01/29/2022) for Diabetes F/U- A1c and UM in office, Previsit labs, Bring meter and logs, Thyroid follow up.  Rayetta Pigg, [facility] [address] [place], [place] [address] Phone: [phone] Fax: [phone]  07/29/2021, 10:54 AM

## 2021-08-11 ENCOUNTER — Other Ambulatory Visit: Payer: Self-pay | Admitting: Family Medicine

## 2021-08-12 ENCOUNTER — Other Ambulatory Visit: Payer: Self-pay

## 2021-08-15 ENCOUNTER — Other Ambulatory Visit: Payer: Self-pay | Admitting: Family Medicine

## 2021-08-15 ENCOUNTER — Ambulatory Visit (INDEPENDENT_AMBULATORY_CARE_PROVIDER_SITE_OTHER): Payer: Medicare HMO

## 2021-08-15 DIAGNOSIS — Z0000 Encounter for general adult medical examination without abnormal findings: Secondary | ICD-10-CM

## 2021-08-15 DIAGNOSIS — I2510 Atherosclerotic heart disease of native coronary artery without angina pectoris: Secondary | ICD-10-CM

## 2021-08-15 NOTE — Progress Notes (Signed)
? ?Subjective:  ? [name] is a 81 y.o. male who presents for Medicare Annual/Subsequent preventive examination. ?I connected with  [name] on 08/15/21 by a audio enabled telemedicine application and verified that I am speaking with the correct person using two identifiers. ? ?Patient Location: Home ? ?Provider Location: Office/Clinic ? ?I discussed the limitations of evaluation and management by telemedicine. The patient expressed understanding and agreed to proceed.  ?Review of Systems    ? ?  ? ?   ?Objective:  ?  ?There were no vitals filed for this visit. ?There is no height or weight on file to calculate BMI. ? ? ?  09/04/2019  ? 10:00 AM 09/01/2019  ? 11:03 PM 08/31/2019  ? 11:20 PM 08/28/2019  ?  4:00 AM 08/27/2019  ?  7:37 PM 08/26/2019  ?  3:29 PM 07/26/2018  ?  9:55 AM  ?Advanced Directives  ?Does Patient Have a Medical Advance Directive? _0  No No  ?Would patient like information on creating a medical advance directive?  No - Patient declined  No - Patient declined No - Patient declined  No - Patient declined  ? ? ?Current Medications (verified) ?Outpatient Encounter Medications as of 08/15/2021  ?Medication Sig  ? acetaminophen (TYLENOL) 500 MG tablet Take 1,000 mg by mouth every 6 (six) hours as needed for moderate pain (left thigh).   ? albuterol (VENTOLIN HFA) 108 (90 Base) MCG/ACT inhaler INHALE 1-2 PUFFS INTO THE LUNGS EVERY 6 HOURS AS NEEDED FOR WHEEZING OR SHORTNESS OF BREATH.  ? allopurinol (ZYLOPRIM) 300 MG tablet TAKE 1 TABLET (300 MG TOTAL) BY MOUTH DAILY.  ? aspirin EC 81 MG tablet Take 81 mg by mouth daily.  ? Blood Glucose Calibration (TRUE METRIX LEVEL 1) Low SOLN Use as directed  ? Blood Glucose Monitoring Suppl (TRUE METRIX METER) w/Device KIT USE AS DIRECTED  ? budesonide-formoterol (SYMBICORT) 80-4.5 MCG/ACT inhaler Inhale 2 puffs into the lungs daily.  ? clopidogrel (PLAVIX) 75 MG tablet TAKE 1 TABLET EVERY DAY  ? DROPLET INSULIN SYRINGE 31G X 5/16" 1 ML MISC USE  AS DIRECTED IN THE MORNING AND AT BEDTIME  ? ezetimibe (ZETIA) 10 MG tablet Take 1 tablet (10 mg total) by mouth daily.  ? glucose blood (TRUE METRIX BLOOD GLUCOSE TEST) test strip 4 times daily testing dx e11.65  ? insulin NPH-regular Human (NOVOLIN 70/30) (70-30) 100 UNIT/ML injection INJECT 55 UNITS UNDER THE SKIN WITH BREAKFAST AND 45 UNITS WITH SUPPER.  ? isosorbide mononitrate (IMDUR) 30 MG 24 hr tablet TAKE 1 TABLET EVERY DAY  ? levETIRAcetam (KEPPRA) 500 MG tablet TAKE 1 TABLET TWICE DAILY  ? levothyroxine (SYNTHROID) 100 MCG tablet TAKE 1 TABLET (100 MCG TOTAL) BY MOUTH DAILY BEFORE BREAKFAST.  ? metoprolol tartrate (LOPRESSOR) 50 MG tablet TAKE 1/2 TABLET TWICE DAILY  ? nitroGLYCERIN (NITROSTAT) 0.4 MG SL tablet Place 1 tablet (0.4 mg total) under the tongue every 5 (five) minutes as needed for chest pain.  ? pantoprazole (PROTONIX) 40 MG tablet TAKE 1 TABLET EVERY DAY BEFORE BREAKFAST  ? polyethylene glycol (MIRALAX / GLYCOLAX) 17 g packet Take 17 g by mouth daily as needed.  ? pravastatin (PRAVACHOL) 40 MG tablet TAKE 2 TABLETS EVERY DAY  ? spironolactone (ALDACTONE) 50 MG tablet Take 1 tablet (50 mg total) by mouth daily.  ? TRUEplus Lancets 33G MISC TEST BLOOD SUGAR FOUR TIMES DAILY  ? UNABLE TO FIND CPAP mask and tubing  ?DX G47.33  ? vitamin  B-12 (CYANOCOBALAMIN) 1000 MCG tablet Take 1,000 mcg by mouth daily.  ? Vitamin D, Ergocalciferol, (DRISDOL) 1.25 MG (50000 UNIT) CAPS capsule Take by mouth.  ? ?No facility-administered encounter medications on file as of 08/15/2021.  ? ? ?Allergies (verified) ?Patient has no known allergies.  ? ?History: ?Past Medical History:  ?Diagnosis Date  ? Abnormality of gait 05/01/2015  ? Anemia 04/19/2015  ? Annual physical exam 12/11/2013  ? Arthritis   ? "left leg; knees" (05/07/2017)  ? Arthritis of knee   ? CAD (coronary artery disease)   ? CHF (congestive heart failure) ([place])   ? Phreesia 08/08/2020  ? Choking 07/13/2015  ? Chronic bronchitis ([place])   ? "get it q yr"   ? Chronic lower back pain   ? "since I was a teen" (05/07/2017)  ? COPD (chronic obstructive pulmonary disease) ([place])   ? CVA (cerebral vascular accident) ([place]) 05/2015  ? R sided weakness (05/07/2017)  ? Diabetes mellitus without complication ([place])   ? Phreesia 08/08/2020  ? GERD (gastroesophageal reflux disease)   ? Gout   ? History of colonic polyps 04/03/2017  ? Added automatically from request for surgery [id number]  ? Hyperlipidemia   ? Hypertension   ? Myocardial infarction [facility]) 1999  ? Obesity   ? OSA on CPAP   ? Oxygen deficiency   ? [employer]reesia 08/08/2020  ? Pneumonia   ? "a few times; last time was ~ 06/2013" (05/07/2017)  ? Psoriasis   ? Stroke [facility])   ? Phreesia 08/08/2020  ? Thyroid disease   ? Phreesia 08/08/2020  ? Tussive syncope 05/01/2015  ? Type II diabetes mellitus ([place])   ? Vitamin B12 deficiency 05/01/2015  ? ?Past Surgical History:  ?Procedure Laterality Date  ? APPENDECTOMY    ? CATARACT EXTRACTION W/PHACO Left 10/07/2016  ? Procedure: CATARACT EXTRACTION PHACO AND INTRAOCULAR LENS PLACEMENT (IOC);  Surgeon: Rutherford Guys, MD;  Location: [facility] OR[facility];  Service: Ophthalmology;  Laterality: Left;  CDE: 13.49  ? CATARACT EXTRACTION W/PHACO Right 10/28/2016  ? Procedure: CATARACT EXTRACTION PHACO AND INTRAOCULAR LENS PLACEMENT (IOC);  Surgeon: Rutherford Guys, MD;  Location: [facility];  Service: Ophthalmology;  Laterality: Right;  CDE: 16.71  ? COLONOSCOPY  03/17/2012  ? Procedure: COLONOSCOPY;  Surgeon: Rogene Houston, MD;  Location: [facility] ENDO [facility];  Service: Endoscopy;  Laterality: N/A;  830  ? COLONOSCOPY N/A 06/25/2017  ? Procedure: COLONOSCOPY;  Surgeon: Rogene Houston, MD;  Location: [facility];  Service: Endoscopy;  Laterality: N/A;  930  ? CORONARY ANGIOPLASTY WITH STENT PLACEMENT  1999- 2009-08/30/2013  ? "counting today's, I have 5 stents" (08/30/2013)  ? CORONARY ANGIOPLASTY WITH STENT PLACEMENT  05/07/2017  ? CORONARY STENT INTERVENTION N/A 05/07/2017  ? Procedure: CORONARY STENT INTERVENTION;   Surgeon: Charolette Forward, MD;  Location: [place] CV LAB;  Service: Cardiovascular;  Laterality: N/A;  ? CYSTOSCOPY WITH URETHRAL DILATATION N/A 11/26/2012  ? Procedure: CYSTOSCOPY WITH URETHRAL DILATATION;  Surgeon: Malka So, MD;  Location: [facility];  Service: Urology;  Laterality: N/A;  ? LEFT HEART CATH AND CORONARY ANGIOGRAPHY N/A 05/07/2017  ? Procedure: LEFT HEART CATH AND CORONARY ANGIOGRAPHY;  Surgeon: Charolette Forward, MD;  Location: [place] CV LAB;  Service: Cardiovascular;  Laterality: N/A;  ? LEFT HEART CATHETERIZATION WITH CORONARY ANGIOGRAM N/A 08/30/2013  ? Procedure: LEFT HEART CATHETERIZATION WITH CORONARY ANGIOGRAM;  Surgeon: Clent Demark, MD;  Location: [facility] CATH LAB;  Service: Cardiovascular;  Laterality: N/A;  ? PERCUTANEOUS CORONARY STENT INTERVENTION (  PCI-S) Right 08/30/2013  ? Procedure: PERCUTANEOUS CORONARY STENT INTERVENTION (PCI-S);  Surgeon: Clent Demark, MD;  Location: [facility] CATH LAB;  Service: Cardiovascular;  Laterality: Right;  ? ?Family History  ?Problem Relation Age of Onset  ? Hypertension Mother   ? Diabetes Mother   ? Hypertension Father   ? Diabetes Brother   ? Stroke Daughter   ? Arthritis Other   ?     Family History   ? Diabetes Other   ?     family History   ? ?Social History  ? ?Socioeconomic History  ? Marital status: Married  ?  Spouse name: Not on file  ? Number of children: 2  ? Years of education: 18  ? Highest education level: 12th grade  ?Occupational History  ? Occupation: Brewing technologist   ?  Comment: retired   ?Tobacco Use  ? Smoking status: Former  ?  Packs/day: 0.50  ?  Years: 33.00  ?  Pack years: 16.50  ?  Types: Cigarettes  ?  Quit date: 11/29/2002  ?  Years since quitting: 18.7  ? Smokeless tobacco: Never  ?Vaping Use  ? Vaping Use: Never used  ?Substance and Sexual Activity  ? Alcohol use: No  ? Drug use: No  ? Sexual activity: Not Currently  ?Other Topics Concern  ? Not on file  ?Social History Narrative  ? Patient drinks about 3 cups of soda  daily.   ? Patient is left handed.   ? Lives alone with wife   ? ?Social Determinants of Health  ? ?Financial Resource Strain: Not on file  ?Food Insecurity: Not on file  ?Transportation Needs: Not on file  ?Physi

## 2021-08-15 NOTE — Patient Instructions (Signed)
?  [name] , ?Thank you for taking time to come for your Medicare Wellness Visit. I appreciate your ongoing commitment to your health goals. Please review the following plan we discussed and let me know if I can assist you in the future.  ? ?These are the goals we discussed: ? Goals   ? ?   Exercise 3x per week (30 min per time)   ?   Recommend starting chair exercises 3 times a week for at least 30 minutes at a time as tolerated. ?  ?   Patient Stated   ?   Prevent falls  ?  ?   Patient Stated   ?   Patient states that his goal is to continue serving the good [employer] and keep living. ?  ?   water (pt-stated)   ?   Increase water intake  ?  ? ?  ?  ?This is a list of the screening recommended for you and due dates:  ?Health Maintenance  ?Topic Date Due  ? Zoster (Shingles) Vaccine (1 of 2) Never done  ? COVID-19 Vaccine (4 - Booster for Pfizer series) 06/17/2020  ? Urine Protein Check  01/04/2022  ? Eye exam for diabetics  01/10/2022  ? Hemoglobin A1C  01/29/2022  ? Complete foot exam   06/24/2022  ? Tetanus Vaccine  03/07/2027  ? Pneumonia Vaccine  Completed  ? Flu Shot  Completed  ? Hepatitis C Screening: USPSTF Recommendation to screen - Ages 71-79 yo.  Completed  ? HPV Vaccine  Aged Out  ?  ?

## 2021-08-21 ENCOUNTER — Other Ambulatory Visit: Payer: Self-pay | Admitting: Family Medicine

## 2021-08-28 ENCOUNTER — Encounter: Payer: Medicare HMO | Admitting: Family Medicine

## 2021-08-30 DIAGNOSIS — E782 Mixed hyperlipidemia: Secondary | ICD-10-CM | POA: Diagnosis not present

## 2021-08-30 DIAGNOSIS — I10 Essential (primary) hypertension: Secondary | ICD-10-CM | POA: Diagnosis not present

## 2021-08-30 DIAGNOSIS — E790 Hyperuricemia without signs of inflammatory arthritis and tophaceous disease: Secondary | ICD-10-CM | POA: Diagnosis not present

## 2021-08-30 DIAGNOSIS — E559 Vitamin D deficiency, unspecified: Secondary | ICD-10-CM | POA: Diagnosis not present

## 2021-08-31 LAB — TSH: TSH: 10.4 u[IU]/mL — ABNORMAL HIGH (ref 0.450–4.500)

## 2021-08-31 LAB — LIPID PANEL
Chol/HDL Ratio: 4.6 ratio (ref 0.0–5.0)
Cholesterol, Total: 111 mg/dL (ref 100–199)
HDL: 24 mg/dL — ABNORMAL LOW (ref >39)
LDL Chol Calc (NIH): 60 mg/dL (ref 0–99)
Triglycerides: 158 mg/dL — ABNORMAL HIGH (ref 0–149)
VLDL Cholesterol Cal: 27 mg/dL (ref 5–40)

## 2021-08-31 LAB — CMP14+EGFR
ALT: 9 IU/L (ref 0–44)
AST: 12 IU/L (ref 0–40)
Albumin/Globulin Ratio: 1.2 (ref 1.2–2.2)
Albumin: 3.8 g/dL (ref 3.7–4.7)
Alkaline Phosphatase: 71 IU/L (ref 44–121)
BUN/Creatinine Ratio: 15 (ref 10–24)
BUN: 25 mg/dL (ref 8–27)
Bilirubin Total: 0.3 mg/dL (ref 0.0–1.2)
CO2: 20 mmol/L (ref 20–29)
Calcium: 9.1 mg/dL (ref 8.6–10.2)
Chloride: 103 mmol/L (ref 96–106)
Creatinine, Ser: 1.66 mg/dL — ABNORMAL HIGH (ref 0.76–1.27)
Globulin, Total: 3.1 g/dL (ref 1.5–4.5)
Glucose: 95 mg/dL (ref 70–99)
Potassium: 4.9 mmol/L (ref 3.5–5.2)
Sodium: 139 mmol/L (ref 134–144)
Total Protein: 6.9 g/dL (ref 6.0–8.5)
eGFR: 42 mL/min/{1.73_m2} — ABNORMAL LOW (ref >59)

## 2021-08-31 LAB — CBC
Hematocrit: 34.2 % — ABNORMAL LOW (ref 37.5–51.0)
Hemoglobin: 10.9 g/dL — ABNORMAL LOW (ref 13.0–17.7)
MCH: 27.5 pg (ref 26.6–33.0)
MCHC: 31.9 g/dL (ref 31.5–35.7)
MCV: 86 fL (ref 79–97)
Platelets: 241 10*3/uL (ref 150–450)
RBC: 3.97 x10E6/uL — ABNORMAL LOW (ref 4.14–5.80)
RDW: 14.4 % (ref 11.6–15.4)
WBC: 10.9 10*3/uL — ABNORMAL HIGH (ref 3.4–10.8)

## 2021-08-31 LAB — VITAMIN D 25 HYDROXY (VIT D DEFICIENCY, FRACTURES): Vit D, 25-Hydroxy: 35.9 ng/mL (ref 30.0–100.0)

## 2021-08-31 LAB — URIC ACID: Uric Acid: 3.9 mg/dL (ref 3.8–8.4)

## 2021-09-02 ENCOUNTER — Other Ambulatory Visit: Payer: Self-pay | Admitting: Nurse Practitioner

## 2021-09-02 DIAGNOSIS — E038 Other specified hypothyroidism: Secondary | ICD-10-CM

## 2021-09-24 DIAGNOSIS — E119 Type 2 diabetes mellitus without complications: Secondary | ICD-10-CM | POA: Diagnosis not present

## 2021-09-24 DIAGNOSIS — E785 Hyperlipidemia, unspecified: Secondary | ICD-10-CM | POA: Diagnosis not present

## 2021-09-24 DIAGNOSIS — I5030 Unspecified diastolic (congestive) heart failure: Secondary | ICD-10-CM | POA: Diagnosis not present

## 2021-09-24 DIAGNOSIS — I10 Essential (primary) hypertension: Secondary | ICD-10-CM | POA: Diagnosis not present

## 2021-09-24 DIAGNOSIS — I25118 Atherosclerotic heart disease of native coronary artery with other forms of angina pectoris: Secondary | ICD-10-CM | POA: Diagnosis not present

## 2021-09-25 ENCOUNTER — Encounter: Payer: Self-pay | Admitting: Family Medicine

## 2021-09-25 ENCOUNTER — Ambulatory Visit (INDEPENDENT_AMBULATORY_CARE_PROVIDER_SITE_OTHER): Payer: Medicare HMO | Admitting: Family Medicine

## 2021-09-25 VITALS — BP 148/86 | HR 76 | Resp 20 | Ht 70.0 in | Wt 268.0 lb

## 2021-09-25 DIAGNOSIS — Z0001 Encounter for general adult medical examination with abnormal findings: Secondary | ICD-10-CM | POA: Insufficient documentation

## 2021-09-25 DIAGNOSIS — E039 Hypothyroidism, unspecified: Secondary | ICD-10-CM | POA: Diagnosis not present

## 2021-09-25 DIAGNOSIS — L409 Psoriasis, unspecified: Secondary | ICD-10-CM

## 2021-09-25 DIAGNOSIS — Z794 Long term (current) use of insulin: Secondary | ICD-10-CM

## 2021-09-25 DIAGNOSIS — E118 Type 2 diabetes mellitus with unspecified complications: Secondary | ICD-10-CM

## 2021-09-25 DIAGNOSIS — L408 Other psoriasis: Secondary | ICD-10-CM

## 2021-09-25 DIAGNOSIS — I69351 Hemiplegia and hemiparesis following cerebral infarction affecting right dominant side: Secondary | ICD-10-CM | POA: Diagnosis not present

## 2021-09-25 NOTE — Patient Instructions (Addendum)
F/U in end October, call if you need me sooner ? ?Need shingrix vaccines ? ?You are referred for eye exam and to Dermatology ? ?NEED to use oxygen  all the time ? ? ?Need to get labs today ordered by Dr [name] ? ?Thanks for choosing [facility], we consider it a privelige to serve you. ? ? ? ? ? ?

## 2021-09-25 NOTE — Assessment & Plan Note (Signed)

## 2021-09-26 ENCOUNTER — Telehealth: Payer: Self-pay

## 2021-09-26 LAB — TSH: TSH: 5.22 u[IU]/mL — ABNORMAL HIGH (ref 0.450–4.500)

## 2021-09-26 LAB — T4, FREE: Free T4: 0.9 ng/dL (ref 0.82–1.77)

## 2021-09-26 MED ORDER — LEVOTHYROXINE SODIUM 112 MCG PO TABS: 112.0000 ug | ORAL_TABLET | Freq: Every day | ORAL | 3 refills | Status: DC

## 2021-09-26 NOTE — Telephone Encounter (Signed)
-----   Message from Brita Romp, NP sent at 09/26/2021  8:02 AM EDT ----- ?Thyroid labs still suggest under replacement.  Need to increase his Levothyroxine dose to 112 mcg po daily and repeat thyroid labs in 2 months. ?

## 2021-09-26 NOTE — Telephone Encounter (Signed)
Pt notified and agrees to start 177mg. ?

## 2021-09-29 ENCOUNTER — Encounter: Payer: Self-pay | Admitting: Family Medicine

## 2021-09-29 NOTE — Assessment & Plan Note (Signed)
Uncontrolled, active lesions on elbows, dermatology to reeval and treat ?

## 2021-09-29 NOTE — Progress Notes (Signed)
? ?  [name]     MRN: [id number]      DOB: 1940/07/08 ? ? ?HPI: ?Patient is in for annual physical exam. ?C/o intermittent left neck catch/ cramp, not  disabling , present for weeks ? ? ?PE; ?BP (!) 148/86   Pulse 76   Resp 20   Ht '5\' 10"'$  (1.778 m)   Wt 268 lb (121.6 kg)   SpO2 (!) 84% Comment: started on 2 liters oxygen  BMI 38.45 kg/m?  ? ?Pleasant male, alert and oriented x 3, hypoxic on room air, needs 24/7 supplemental oxygen and is aware of this. ?HEENT ?No facial trauma or asymetry. Sinuses non tender. ?EOMI ?External ears normal,  ?Neck: decreased rOM, no adenopathy,JVD or thyromegaly.No bruits. ? ?Chest: ?Clear to ascultation bilaterally.No crackles or wheezes.Decreased air entry throughout ?Non tender to palpation ? ?Cardiovascular system; ?Heart sounds normal,  S1 and  S2 ,no S3.  No murmur, or thrill. ?Apical beat not displaced ?Peripheral pulses normal. ? ?Abdomen: ?Soft, non tender, no organomegaly or masses. ?. ? ? ? ?Musculoskeletal exam: ?Decreased  ROM of spine, hips , shoulders and knees. ? deformity ,swelling and crepitus noted. ? muscle wasting present on right side  ? ?Neurologic: ?Cranial nerves 2 to 12 intact. ?Right hemiparesis, with grade 4 power on right unchanged since CVA ?Skin: ?Intact,  , scaling rash noted.on elbows ?Pigmentation normal throughout ? ?Psych; ?Normal mood and affect. Judgement and concentration normal ? ? ?Assessment & Plan:  ?Encounter for annual general medical examination with abnormal findings in adult ?Annual exam as documented. ?Counseling done  re healthy lifestyle involving commitment to 150 minutes exercise per week, heart healthy diet, and attaining healthy weight.The importance of adequate sleep also discussed. ?Regular seat belt use and home safety, is also discussed. ?Changes in health habits are decided on by the patient with goals and time frames  set for achieving them. ?Immunization and cancer screening needs are specifically addressed at  this visit. ? ?

## 2021-11-21 ENCOUNTER — Other Ambulatory Visit: Payer: Self-pay | Admitting: Family Medicine

## 2021-11-27 DIAGNOSIS — H6123 Impacted cerumen, bilateral: Secondary | ICD-10-CM | POA: Diagnosis not present

## 2021-11-27 DIAGNOSIS — H6122 Impacted cerumen, left ear: Secondary | ICD-10-CM | POA: Diagnosis not present

## 2021-11-27 DIAGNOSIS — H6121 Impacted cerumen, right ear: Secondary | ICD-10-CM | POA: Diagnosis not present

## 2021-12-25 DIAGNOSIS — E119 Type 2 diabetes mellitus without complications: Secondary | ICD-10-CM | POA: Diagnosis not present

## 2021-12-25 DIAGNOSIS — E785 Hyperlipidemia, unspecified: Secondary | ICD-10-CM | POA: Diagnosis not present

## 2021-12-25 DIAGNOSIS — I25118 Atherosclerotic heart disease of native coronary artery with other forms of angina pectoris: Secondary | ICD-10-CM | POA: Diagnosis not present

## 2021-12-25 DIAGNOSIS — I10 Essential (primary) hypertension: Secondary | ICD-10-CM | POA: Diagnosis not present

## 2022-01-05 ENCOUNTER — Other Ambulatory Visit: Payer: Self-pay | Admitting: Family Medicine

## 2022-01-13 DIAGNOSIS — E119 Type 2 diabetes mellitus without complications: Secondary | ICD-10-CM | POA: Diagnosis not present

## 2022-01-13 DIAGNOSIS — E039 Hypothyroidism, unspecified: Secondary | ICD-10-CM | POA: Diagnosis not present

## 2022-01-13 DIAGNOSIS — E785 Hyperlipidemia, unspecified: Secondary | ICD-10-CM | POA: Diagnosis not present

## 2022-01-13 DIAGNOSIS — I2510 Atherosclerotic heart disease of native coronary artery without angina pectoris: Secondary | ICD-10-CM | POA: Diagnosis not present

## 2022-01-13 DIAGNOSIS — I10 Essential (primary) hypertension: Secondary | ICD-10-CM | POA: Diagnosis not present

## 2022-01-14 LAB — T4, FREE: Free T4: 1.27 ng/dL (ref 0.82–1.77)

## 2022-01-14 LAB — TSH: TSH: 1.17 u[IU]/mL (ref 0.450–4.500)

## 2022-01-15 ENCOUNTER — Other Ambulatory Visit: Payer: Self-pay | Admitting: Family Medicine

## 2022-01-29 ENCOUNTER — Ambulatory Visit: Payer: Medicare HMO | Admitting: Nurse Practitioner

## 2022-01-29 ENCOUNTER — Encounter: Payer: Self-pay | Admitting: Nurse Practitioner

## 2022-01-29 VITALS — BP 163/72 | HR 75 | Ht 70.0 in | Wt 266.6 lb

## 2022-01-29 DIAGNOSIS — E559 Vitamin D deficiency, unspecified: Secondary | ICD-10-CM | POA: Diagnosis not present

## 2022-01-29 DIAGNOSIS — I10 Essential (primary) hypertension: Secondary | ICD-10-CM

## 2022-01-29 DIAGNOSIS — E782 Mixed hyperlipidemia: Secondary | ICD-10-CM

## 2022-01-29 DIAGNOSIS — E1165 Type 2 diabetes mellitus with hyperglycemia: Secondary | ICD-10-CM

## 2022-01-29 DIAGNOSIS — E038 Other specified hypothyroidism: Secondary | ICD-10-CM

## 2022-01-29 LAB — POCT GLYCOSYLATED HEMOGLOBIN (HGB A1C): Hemoglobin A1C: 6.6 % — AB (ref 4.0–5.6)

## 2022-01-29 NOTE — Progress Notes (Signed)
01/29/2022  Endocrinology follow-up note  Subjective:    Patient ID: [name], male    DOB: Jan 10, 1941, PCP Fayrene Helper, MD   Past Medical History:  Diagnosis Date   Abnormality of gait 05/01/2015   Anemia 04/19/2015   Annual physical exam 12/11/2013   Arthritis    "left leg; knees" (05/07/2017)   Arthritis of knee    CAD (coronary artery disease)    CHF (congestive heart failure) ([place])    Phreesia 08/08/2020   Choking 07/13/2015   Chronic bronchitis ([place])    "get it q yr"   Chronic lower back pain    "since I was a teen" (05/07/2017)   COPD (chronic obstructive pulmonary disease) ([place])    CVA (cerebral vascular accident) ([place]) 05/2015   R sided weakness (05/07/2017)   Diabetes mellitus without complication ([place])    Phreesia 08/08/2020   GERD (gastroesophageal reflux disease)    Gout    History of colonic polyps 04/03/2017   Added automatically from request for surgery [id number]   Hyperlipidemia    Hypertension    Myocardial infarction ([place]) 1999   Obesity    OSA on CPAP    Oxygen deficiency    Phreesia 08/08/2020   Pneumonia    "a few times; last time was ~ 06/2013" (05/07/2017)   Psoriasis    Stroke ([employer])    Phreesia 08/08/2020   Thyroid disease    Phreesia 08/08/2020   Tussive syncope 05/01/2015   Type II diabetes mellitus ([place])    Vitamin B12 deficiency 05/01/2015   Past Surgical History:  Procedure Laterality Date   APPENDECTOMY     CATARACT EXTRACTION W/PHACO Left 10/07/2016   Procedure: CATARACT EXTRACTION PHACO AND INTRAOCULAR LENS PLACEMENT ([place]);  Surgeon: Rutherford Guys, MD;  Location: [facility];  Service: Ophthalmology;  Laterality: Left;  CDE: 13.49   CATARACT EXTRACTION W/PHACO Right 10/28/2016   Procedure: CATARACT EXTRACTION PHACO AND INTRAOCULAR LENS PLACEMENT ([facility]);  Surgeon: Rutherford Guys, MD;  Location: [facility];  Service: Ophthalmology;  Laterality: Right;  CDE: 16.71   COLONOSCOPY  03/17/2012   Procedure: COLONOSCOPY;  Surgeon: Rogene Houston, MD;  Location: [facility];  Service: Endoscopy;  Laterality: N/A;  830   COLONOSCOPY N/A 06/25/2017   Procedure: COLONOSCOPY;  Surgeon: Rogene Houston, MD;  [facility];  Service: Endoscopy;  Laterality: N/A;  [place]- 2009-08/30/2013   "counting today's, I have 5 stents" (08/30/2013)   CORONARY ANGIOPLASTY WITH STENT PLACEMENT  05/07/2017   CORONARY STENT INTERVENTION N/A 05/07/2017   Procedure: CORONARY STENT INTERVENTION;  Surgeon: Charolette Forward, MD;  Location: [facility];  Service: Cardiovascular;  Laterality: N/A;   CYSTOSCOPY WITH URETHRAL DILATATION N/A 11/26/2012   Procedure: CYSTOSCOPY WITH URETHRAL DILATATION;  Surgeon: Malka So, MD;  Location: [facility];  Service: Urology;  Laterality: N/A;   LEFT HEART CATH AND CORONARY ANGIOGRAPHY N/A 05/07/2017   Procedure: LEFT HEART CATH AND CORONARY ANGIOGRAPHY;  Surgeon: Charolette Forward, MD;  Location: [facility];  Service: Cardiovascular;  Laterality: N/A;   LEFT HEART CATHETERIZATION WITH CORONARY ANGIOGRAM N/A 08/30/2013   Procedure: LEFT HEART CATHETERIZATION WITH CORONARY ANGIOGRAM;  Surgeon: Clent Demark, MD;  Location: [facility];  Service: Cardiovascular;  Laterality: N/A;   PERCUTANEOUS CORONARY STENT INTERVENTION (PCI-S) Right 08/30/2013   Procedure: PERCUTANEOUS CORONARY STENT INTERVENTION (PCI-S);  Surgeon: Clent Demark, MD;  [facility]: [facility] CATH LAB;  Service: Cardiovascular;  Laterality: Right;   Social History   Socioeconomic History   Marital status: Married    Spouse name: Not on file   Number of children: 2   Years of education: 11   Highest education level: 12th grade  Occupational History   Occupation: Brewing technologist     Comment: retired   Tobacco Use   Smoking status: Former    Packs/day: 0.50    Years: 33.00    Total pack years: 16.50    Types: Cigarettes    Quit date: 11/29/2002    Years since quitting: 19.1    Smokeless tobacco: Never  Vaping Use   Vaping Use: Never used  Substance and Sexual Activity   Alcohol use: No   Drug use: No   Sexual activity: Not Currently  Other Topics Concern   Not on file  Social History Narrative   Patient drinks about 3 cups of soda daily.    Patient is left handed.    Lives alone with wife    Social Determinants of Health   Financial Resource Strain: Low Risk  (08/08/2020)   Overall Financial Resource Strain (CARDIA)    Difficulty of Paying Living Expenses: Not hard at all  Food Insecurity: Unknown (08/08/2019)   Hunger Vital Sign    Worried About Running Out of Food in the Last Year: Not on file    Ran Out of Food in the Last Year: Never true  Transportation Needs: No Transportation Needs (08/08/2020)   PRAPARE - Hydrologist (Medical): No    Lack of Transportation (Non-Medical): No  Physical Activity: Inactive (08/08/2020)   Exercise Vital Sign    Days of Exercise per Week: 0 days    Minutes of Exercise per Session: 0 min  Stress: No Stress Concern Present (08/08/2019)   [place]    Feeling of Stress : Not at all  Social Connections: Moderately Integrated (08/08/2020)   Social Connection and Isolation Panel [NHANES]    Frequency of Communication with Friends and Family: Three times a week    Frequency of Social Gatherings with Friends and Family: Three times a week    Attends Religious Services: 1 to 4 times per year    Active Member of Clubs or Organizations: No    Attends Archivist Meetings: Never    Marital Status: Married   Outpatient Encounter Medications as of 01/29/2022  Medication Sig   acetaminophen (TYLENOL) 500 MG tablet Take 1,000 mg by mouth every 6 (six) hours as needed for moderate pain (left thigh).    albuterol (VENTOLIN HFA) 108 (90 Base) MCG/ACT inhaler INHALE 1-2 PUFFS INTO THE LUNGS EVERY 6 HOURS AS NEEDED FOR WHEEZING OR  SHORTNESS OF BREATH.   allopurinol (ZYLOPRIM) 300 MG tablet TAKE 1 TABLET EVERY DAY   aspirin EC 81 MG tablet Take 81 mg by mouth daily.   Blood Glucose Calibration (TRUE METRIX LEVEL 1) Low SOLN Use as directed   Blood Glucose Monitoring Suppl (TRUE METRIX METER) w/Device KIT USE AS DIRECTED   budesonide-formoterol (SYMBICORT) 80-4.5 MCG/ACT inhaler Inhale 2 puffs into the lungs daily.   clopidogrel (PLAVIX) 75 MG tablet TAKE 1 TABLET EVERY DAY   DROPLET INSULIN SYRINGE 31G X 5/16" 1 ML MISC USE AS DIRECTED IN THE MORNING AND AT BEDTIME   ezetimibe (ZETIA) 10 MG tablet Take 1 tablet (10 mg total) by mouth daily.   glucose blood (TRUE METRIX  BLOOD GLUCOSE TEST) test strip 4 times daily testing dx e11.65   insulin NPH-regular Human (NOVOLIN 70/30) (70-30) 100 UNIT/ML injection INJECT 55 UNITS UNDER THE SKIN WITH BREAKFAST AND 45 UNITS WITH SUPPER.   isosorbide mononitrate (IMDUR) 30 MG 24 hr tablet TAKE 1 TABLET EVERY DAY   levETIRAcetam (KEPPRA) 500 MG tablet TAKE 1 TABLET TWICE DAILY   levothyroxine (SYNTHROID) 112 MCG tablet Take 1 tablet (112 mcg total) by mouth daily.   metoprolol tartrate (LOPRESSOR) 50 MG tablet TAKE 1/2 TABLET TWICE DAILY   pantoprazole (PROTONIX) 40 MG tablet TAKE 1 TABLET EVERY DAY BEFORE BREAKFAST   polyethylene glycol (MIRALAX / GLYCOLAX) 17 g packet Take 17 g by mouth daily as needed.   pravastatin (PRAVACHOL) 40 MG tablet TAKE 2 TABLETS EVERY DAY   spironolactone (ALDACTONE) 50 MG tablet TAKE 1 TABLET EVERY DAY (DOSE INCREASE)   TRUEplus Lancets 33G MISC TEST BLOOD SUGAR FOUR TIMES DAILY   UNABLE TO FIND CPAP mask and tubing  DX G47.33   vitamin B-12 (CYANOCOBALAMIN) 1000 MCG tablet Take 1,000 mcg by mouth daily.   Vitamin D, Ergocalciferol, (DRISDOL) 1.25 MG (50000 UNIT) CAPS capsule Take by mouth.   nitroGLYCERIN (NITROSTAT) 0.4 MG SL tablet Place 1 tablet (0.4 mg total) under the tongue every 5 (five) minutes as needed for chest pain.   No  facility-administered encounter medications on file as of 01/29/2022.   ALLERGIES: No Known Allergies VACCINATION STATUS: Immunization History  Administered Date(s) Administered   Fluad Quad(high Dose 65+) 01/31/2019, 03/01/2020, 06/20/2021   Influenza Whole 03/18/2005   Influenza, High Dose Seasonal PF 02/22/2018   Influenza,inj,Quad PF,6+ Mos 03/19/2017   PFIZER(Purple Top)SARS-COV-2 Vaccination 06/25/2019, 07/16/2019, 04/22/2020   Pneumococcal Conjugate-13 12/08/2013   Pneumococcal Polysaccharide-23 06/09/2012   Td 12/04/2003   Tdap 03/06/2017    Diabetes He presents for his follow-up diabetic visit. He has type 2 diabetes mellitus. Onset time: He was diagnosed at approximate age of 33 years. His disease course has been stable. There are no hypoglycemic associated symptoms. Pertinent negatives for hypoglycemia include no confusion, headaches, nervousness/anxiousness, pallor, seizures or tremors. Pertinent negatives for diabetes include no chest pain, no fatigue, no polydipsia, no polyphagia, no polyuria, no weakness and no weight loss. There are no hypoglycemic complications. Symptoms are stable. Diabetic complications include a CVA, heart disease, nephropathy and peripheral neuropathy. Risk factors for coronary artery disease include dyslipidemia, diabetes mellitus, male sex, obesity, tobacco exposure, sedentary lifestyle and hypertension. Current diabetic treatment includes insulin injections. He is compliant with treatment most of the time. His weight is decreasing steadily. He is following a generally healthy diet. When asked about meal planning, he reported none. He has not had a previous visit with a dietitian. He rarely participates in exercise. His home blood glucose trend is fluctuating minimally. His breakfast blood glucose range is generally 110-130 mg/dl. His dinner blood glucose range is generally 140-180 mg/dl. (He presents today with his logs, no meter, showing at target glycemic  profile overall.  His POCT A1c today is 6.6%, improving from previous visit.  He denies any hypoglycemia.  ) An ACE inhibitor/angiotensin II receptor blocker is not being taken. He does not see a podiatrist.Eye exam is current.  Hyperlipidemia This is a chronic problem. The current episode started more than 1 year ago. The problem is uncontrolled. Recent lipid tests were reviewed and are variable (elevated triglycerides). Exacerbating diseases include chronic renal disease, diabetes, hypothyroidism and obesity. Factors aggravating his hyperlipidemia include beta blockers and fatty foods. Associated  symptoms include shortness of breath. Pertinent negatives include no chest pain or myalgias. Current antihyperlipidemic treatment includes statins. The current treatment provides moderate improvement of lipids. Compliance problems include adherence to exercise and adherence to diet (r/t decreased mobility from prior CVA).  Risk factors for coronary artery disease include diabetes mellitus, dyslipidemia, hypertension, male sex, a sedentary lifestyle and obesity.  Hypertension This is a chronic problem. The current episode started more than 1 year ago. The problem is unchanged. The problem is controlled. Associated symptoms include shortness of breath. Pertinent negatives include no chest pain, headaches, neck pain or palpitations. Agents associated with hypertension include thyroid hormones. Risk factors for coronary artery disease include dyslipidemia, diabetes mellitus, sedentary lifestyle, male gender and obesity. Past treatments include beta blockers and diuretics. The current treatment provides mild improvement. There are no compliance problems.  Hypertensive end-organ damage includes kidney disease, CAD/MI and CVA. Identifiable causes of hypertension include chronic renal disease and a thyroid problem.  Thyroid Problem Presents for follow-up visit. Patient reports no anxiety, cold intolerance, constipation,  depressed mood, diarrhea, fatigue, heat intolerance, palpitations, tremors, weight gain or weight loss. The symptoms have been stable. Past treatments include levothyroxine. His past medical history is significant for diabetes and hyperlipidemia.     Review of systems  Constitutional: + steadily decreasing body weight,  current Body mass index is 38.25 kg/m. , no fatigue, no subjective hyperthermia, no subjective hypothermia Eyes: no blurry vision, no xerophthalmia ENT: no sore throat, no nodules palpated in throat, no dysphagia/odynophagia, no hoarseness Cardiovascular: no chest pain, no shortness of breath, no palpitations, no leg swelling Respiratory: no cough, no shortness of breath Gastrointestinal: no nausea/vomiting/diarrhea Musculoskeletal: no muscle/joint aches, walks with platform walker due to mobility deficits from previous CVA Skin: no rashes, no hyperemia Neurological: no tremors, no tingling, no dizziness, numbness reported in RUE and RLE (from previous stroke) Psychiatric: no depression, no anxiety    Objective:    BP (!) 163/72 (BP Location: Right Arm, Patient Position: Sitting, Cuff Size: Large)   Pulse 75   Ht $R'5\' 10"'JD$  (1.778 m)   Wt 266 lb 9.6 oz (120.9 kg)   BMI 38.25 kg/m   Wt Readings from Last 3 Encounters:  01/29/22 266 lb 9.6 oz (120.9 kg)  09/25/21 268 lb (121.6 kg)  07/29/21 269 lb (122 kg)     BP Readings from Last 3 Encounters:  01/29/22 (!) 163/72  09/25/21 (!) 148/86  07/29/21 (!) 157/78      Physical Exam- Limited  Constitutional:  Body mass index is 38.25 kg/m. , not in acute distress, normal state of mind Eyes:  EOMI, no exophthalmos Neck: Supple Cardiovascular: RRR, no murmurs, rubs, or gallops, no edema Respiratory: Adequate breathing efforts, no crackles, rales, rhonchi, or wheezing Musculoskeletal: no gross deformities, walks with walker due to previous CVA Skin:  no rashes, no hyperemia Neurological: no tremor with outstretched  hands    CMP     Component Value Date/Time   NA 139 08/30/2021 0830   K 4.9 08/30/2021 0830   CL 103 08/30/2021 0830   CO2 20 08/30/2021 0830   GLUCOSE 95 08/30/2021 0830   GLUCOSE 192 (H) 02/16/2020 0838   BUN 25 08/30/2021 0830   CREATININE 1.66 (H) 08/30/2021 0830   CREATININE 1.12 02/16/2020 0838   CALCIUM 9.1 08/30/2021 0830   PROT 6.9 08/30/2021 0830   ALBUMIN 3.8 08/30/2021 0830   AST 12 08/30/2021 0830   ALT 9 08/30/2021 0830   ALKPHOS 71 08/30/2021 0830  BILITOT 0.3 08/30/2021 0830   GFRNONAA 46 (L) 06/20/2020 0907   GFRNONAA 63 02/16/2020 0838   GFRAA 53 (L) 06/20/2020 0907   GFRAA 73 02/16/2020 0838     Diabetic Labs (most recent): Lab Results  Component Value Date   HGBA1C 6.6 (A) 01/29/2022   HGBA1C 7.0 07/29/2021   HGBA1C 6.6 (A) 01/28/2021   MICROALBUR 6.4 10/11/2019   MICROALBUR 4.2 07/13/2018   MICROALBUR 7.8 (H) 03/23/2014     Lipid Panel ( most recent) Lipid Panel     Component Value Date/Time   CHOL 111 08/30/2021 0830   TRIG 158 (H) 08/30/2021 0830   HDL 24 (L) 08/30/2021 0830   CHOLHDL 4.6 08/30/2021 0830   CHOLHDL 5.3 (H) 10/11/2019 0924   VLDL 35 04/20/2017 1130   LDLCALC 60 08/30/2021 0830   LDLCALC 67 10/11/2019 0924   LDLDIRECT 50 08/18/2014 0737      Assessment & Plan:   1) Type 2 diabetes mellitus with multiple competitions including diabetic nephropathy  -His diabetes is complicated by coronary artery disease, CVA, CKD and patient remains at a high risk for more acute and chronic complications of diabetes which include CAD, CVA, CKD, retinopathy, and neuropathy. These are all discussed in detail with the patient.  He presents today with his logs, no meter, showing at target glycemic profile overall.  His POCT A1c today is 6.6%, improving from previous visit.  He denies any hypoglycemia.  - Recent labs reviewed.  - Nutritional counseling repeated at each appointment due to patients tendency to fall back in to old  habits.  - The patient admits there is a room for improvement in their diet and drink choices. -  Suggestion is made for the patient to avoid simple carbohydrates from their diet including Cakes, Sweet Desserts / Pastries, Ice Cream, Soda (diet and regular), Sweet Tea, Candies, Chips, Cookies, Sweet Pastries, Store Bought Juices, Alcohol in Excess of 1-2 drinks a day, Artificial Sweeteners, Coffee Creamer, and "Sugar-free" Products. This will help patient to have stable blood glucose profile and potentially avoid unintended weight gain.   - I encouraged the patient to switch to unprocessed or minimally processed complex starch and increased protein intake (animal or plant source), fruits, and vegetables.   - Patient is advised to stick to a routine mealtimes to eat 3 meals a day and avoid unnecessary snacks (to snack only to correct hypoglycemia).  - I have approached patient with the following individualized plan to manage diabetes and patient agrees.  - He has done reasonably very well on premixed Novolin 70/30. He wishes to stay on the same regimen for cost reasons.   - Given his stable, at goal, glycemic profile, he is advised to continue current regimen of Novolin 70/30 55 units with breakfast and 45 units with supper if glucose is above 90 and he is eating.    -He is advised to continue monitoring blood glucose at least 3 times per day, before injecting insulin at breakfast and supper, and before bed.  He is instructed to call the clinic if he has readings less than 70 or greater than 300 for 3 tests in a row.  -He is warned not to take insulin without proper monitoring per orders.  - Patient specific target  for A1c; LDL, HDL, Triglycerides, and  Waist Circumference were discussed in detail.  2) BP/HTN:  His blood pressure is controlled to target for his age.  He is advised to continue Metoprolol 25 mg po twice daily,  Isosorbide 30 mg po daily, and Spironolactone 50 mg po daily.    3)  Lipids/HPL:  His most recent lipid panel from 08/30/21 shows controlled LDL of 60 and elevated triglycerides of 158 (improving).  He is advised to continue Pravastatin 40 mg po daily at bedtime.  Side effects and precautions discussed with him.  He is advised to avoid fried foods and butter.    4)  Weight/Diet: His Body mass index is 38.25 kg/m.-  He is a candidate for modest weight loss.  He cannot exercise optimally, limited by pulmonary sufficiency due to COPD and previous CVA.  CDE consult in progress, exercise, and carbohydrates information provided.  5) Hypothyroidism:  -His previsit thyroid function tests are consistent with appropriate hormone replacement.  He is advised to continue current dose of Levothyroxine 100 mcg po daily before breakfast.     - We discussed about the correct intake of his thyroid hormone, on empty stomach at fasting, with water, separated by at least 30 minutes from breakfast and other medications,  and separated by more than 4 hours from calcium, iron, multivitamins, acid reflux medications (PPIs). -Patient is made aware of the fact that thyroid hormone replacement is needed for life, dose to be adjusted by periodic monitoring of thyroid function tests.  6) Vitamin D deficiency His most recent vitamin D level on 09/14/20 was 25.3.  He is not sure if he is still taking his supplement as his wife manages his medication.  He is advised to start OTC Vitamin D3 5000 units daily until next measurement.  7) Chronic Care/Health Maintenance: -Patient is on Statin medications and encouraged to continue to follow up with Ophthalmology, nephrology, Podiatrist at least yearly or according to recommendations, and advised to stay away from smoking. I have recommended yearly flu vaccine and pneumonia vaccination at least every 5 years;  and  sleep for at least 7 hours a day.   - I advised patient to maintain close follow up with Fayrene Helper, MD for primary care  needs.     I spent 30 minutes in the care of the patient today including review of labs from [place], Lipids, Thyroid Function, Hematology (current and previous including abstractions from other facilities); face-to-face time discussing  his blood glucose readings/logs, discussing hypoglycemia and hyperglycemia episodes and symptoms, medications doses, his options of short and long term treatment based on the latest standards of care / guidelines;  discussion about incorporating lifestyle medicine;  and documenting the encounter. Risk reduction counseling performed per USPSTF guidelines to reduce obesity and cardiovascular risk factors.     Please refer to Patient Instructions for Blood Glucose Monitoring and Insulin/Medications Dosing Guide"  in media tab for additional information. Please  also refer to " Patient Self Inventory" in the Media  tab for reviewed elements of pertinent patient history.  [name] participated in the discussions, expressed understanding, and voiced agreement with the above plans.  All questions were answered to his satisfaction. he is encouraged to contact clinic should he have any questions or concerns prior to his return visit.   Follow up plan: -Return in about 6 months (around 07/30/2022) for Diabetes F/U with A1c in office, Thyroid follow up, Bring meter and logs, Previsit labs.  Rayetta Pigg, [facility] [address] [place], [place] [address] Phone: [phone] Fax: [phone]  01/29/2022, 10:52 AM

## 2022-03-05 ENCOUNTER — Other Ambulatory Visit: Payer: Self-pay | Admitting: Nurse Practitioner

## 2022-03-05 ENCOUNTER — Other Ambulatory Visit: Payer: Self-pay | Admitting: Family Medicine

## 2022-03-05 DIAGNOSIS — J449 Chronic obstructive pulmonary disease, unspecified: Secondary | ICD-10-CM

## 2022-03-11 ENCOUNTER — Other Ambulatory Visit: Payer: Self-pay

## 2022-03-11 ENCOUNTER — Encounter: Payer: Self-pay | Admitting: Family Medicine

## 2022-03-11 ENCOUNTER — Ambulatory Visit (INDEPENDENT_AMBULATORY_CARE_PROVIDER_SITE_OTHER): Payer: Medicare HMO | Admitting: Family Medicine

## 2022-03-11 VITALS — BP 168/70 | HR 80 | Ht 70.0 in | Wt 268.0 lb

## 2022-03-11 DIAGNOSIS — R569 Unspecified convulsions: Secondary | ICD-10-CM

## 2022-03-11 DIAGNOSIS — E782 Mixed hyperlipidemia: Secondary | ICD-10-CM

## 2022-03-11 DIAGNOSIS — I10 Essential (primary) hypertension: Secondary | ICD-10-CM

## 2022-03-11 DIAGNOSIS — E790 Hyperuricemia without signs of inflammatory arthritis and tophaceous disease: Secondary | ICD-10-CM | POA: Diagnosis not present

## 2022-03-11 DIAGNOSIS — E1149 Type 2 diabetes mellitus with other diabetic neurological complication: Secondary | ICD-10-CM

## 2022-03-11 DIAGNOSIS — I2510 Atherosclerotic heart disease of native coronary artery without angina pectoris: Secondary | ICD-10-CM

## 2022-03-11 DIAGNOSIS — E039 Hypothyroidism, unspecified: Secondary | ICD-10-CM

## 2022-03-11 DIAGNOSIS — Z794 Long term (current) use of insulin: Secondary | ICD-10-CM | POA: Diagnosis not present

## 2022-03-11 DIAGNOSIS — E118 Type 2 diabetes mellitus with unspecified complications: Secondary | ICD-10-CM

## 2022-03-11 DIAGNOSIS — E559 Vitamin D deficiency, unspecified: Secondary | ICD-10-CM

## 2022-03-11 DIAGNOSIS — Z125 Encounter for screening for malignant neoplasm of prostate: Secondary | ICD-10-CM | POA: Diagnosis not present

## 2022-03-11 DIAGNOSIS — Z23 Encounter for immunization: Secondary | ICD-10-CM

## 2022-03-11 DIAGNOSIS — E038 Other specified hypothyroidism: Secondary | ICD-10-CM

## 2022-03-11 DIAGNOSIS — G4733 Obstructive sleep apnea (adult) (pediatric): Secondary | ICD-10-CM

## 2022-03-11 DIAGNOSIS — R269 Unspecified abnormalities of gait and mobility: Secondary | ICD-10-CM

## 2022-03-11 MED ORDER — LEVOTHYROXINE SODIUM 112 MCG PO TABS: 112.0000 ug | ORAL_TABLET | Freq: Every day | ORAL | 0 refills | Status: DC

## 2022-03-11 NOTE — Assessment & Plan Note (Signed)
Controlled, no change in medication [name] is reminded of the importance of commitment to daily physical activity for 30 minutes or more, as able and the need to limit carbohydrate intake to 30 to 60 grams per meal to help with blood sugar control.   The need to take medication as prescribed, test blood sugar as directed, and to call between visits if there is a concern that blood sugar is uncontrolled is also discussed.   [name] is reminded of the importance of daily foot exam, annual eye examination, and good blood sugar, blood pressure and cholesterol control.     Latest Ref Rng & Units 01/29/2022   10:45 AM 08/30/2021    8:30 AM 07/29/2021   10:40 AM 01/28/2021   10:35 AM 01/22/2021   10:22 AM  Diabetic Labs  HbA1c 4.0 - 5.6 % 6.6   7.0  6.6    Chol 100 - 199 mg/dL  111      HDL >39 mg/dL  24      Calc LDL 0 - 99 mg/dL  60      Triglycerides 0 - 149 mg/dL  158      Creatinine 0.76 - 1.27 mg/dL  1.66    1.26       03/11/2022    8:56 AM 03/11/2022    8:55 AM 01/29/2022   10:30 AM 09/25/2021    3:59 PM 09/25/2021    3:18 PM 07/29/2021   10:30 AM 07/04/2021    2:54 PM  BP/Weight  Systolic BP 062 376 283 151 761 607 371  Diastolic BP 70 67 72 86 71 78 81  Wt. (Lbs)  268.04 266.6  268 269 270  BMI  38.46 kg/m2 38.25 kg/m2  38.45 kg/m2 38.6 kg/m2 38.74 kg/m2      Latest Ref Rng & Units 06/20/2021    8:00 AM 01/10/2021   12:00 AM  Foot/eye exam completion dates  Eye Exam No Retinopathy  No Retinopathy      Foot Form Completion  Done      This result is from an external source.     '

## 2022-03-11 NOTE — Progress Notes (Signed)
[name]     MRN: [id number]      DOB: November 03, 1940   HPI Mr. [name] is here for follow up and re-evaluation of chronic medical conditions, medication management and review of any available recent lab and radiology data.  Preventive health is updated, specifically  Cancer screening and Immunization.   Questions or concerns regarding consultations or procedures which the PT has had in the interim are  addressed. The PT denies any adverse reactions to current medications since the last visit.  There are no new concerns.  There are no specific complaints   ROS Denies recent fever or chills. Denies sinus pressure, nasal congestion, ear pain or sore throat. Denies chest congestion, productive cough or wheezing. Denies chest pains, palpitations and leg swelling Denies abdominal pain, nausea, vomiting,diarrhea or constipation.   Denies dysuria, frequency, hesitancy or incontinence. Chronic  joint pain, swelling and limitation in mobility. Denies headaches, seizures, numbness, or tingling. Denies depression, anxiety or insomnia. Denies skin break down or rash.   PE  BP (!) 168/70 (BP Location: Right Arm, Patient Position: Sitting, Cuff Size: Large)   Pulse 80   Ht '5\' 10"'$  (1.778 m)   Wt 268 lb 0.6 oz (121.6 kg)   SpO2 92%   BMI 38.46 kg/m   Patient alert and oriented and in no cardiopulmonary distress.  HEENT: No facial asymmetry, EOMI,     Neck decreased ROM .  Chest: Clear to auscultation bilaterally.  CVS: S1, S2 no murmurs, no S3.Regular rate.  ABD: Soft non tender.   Ext: No edema  MS: decreased  ROM spine, shoulders, hips and knees.  Skin: Intact, no ulcerations or rash noted.  Psych: Good eye contact, normal affect. Memory intact not anxious or depressed appearing.  CNS: CN 2-12 intact, power,  normal throughout.no focal deficits noted.   Assessment & Plan  Essential hypertension Not at goal, managed per Cardiology, no med change DASH diet and  commitment to daily physical activity for a minimum of 30 minutes discussed and encouraged, as a part of hypertension management. The importance of attaining a healthy weight is also discussed.     03/11/2022    8:56 AM 03/11/2022    8:55 AM 01/29/2022   10:30 AM 09/25/2021    3:59 PM 09/25/2021    3:18 PM 07/29/2021   10:30 AM 07/04/2021    2:54 PM  BP/Weight  Systolic BP 811 [phone]  Diastolic BP 70 67 72 86 71 78 81  Wt. (Lbs)  268.04 266.6  268 269 270  BMI  38.46 kg/m2 38.25 kg/m2  38.45 kg/m2 38.6 kg/m2 38.74 kg/m2       Hypothyroidism Controlled, no change in medication   Partial seizure (HCC) Controlled, no change in medication   OSA on CPAP Compliance with enactment confirmed  Mixed hyperlipidemia Hyperlipidemia:Low fat diet discussed and encouraged.   Lipid Panel  Lab Results  Component Value Date   CHOL 111 08/30/2021   HDL 24 (L) 08/30/2021   LDLCALC 60 08/30/2021   LDLDIRECT 50 08/18/2014   TRIG 158 (H) 08/30/2021   CHOLHDL 4.6 08/30/2021  not at goal Updated lab today.      Abnormality of gait Home safety reviewed, no falls reported  CAD (coronary artery disease) Stable , asymptomatic, no use of nTG for years  Type II diabetes mellitus ([place]) Controlled, no change in medication Mr. [name] is reminded of the importance of commitment to daily physical activity for 30 minutes  or more, as able and the need to limit carbohydrate intake to 30 to 60 grams per meal to help with blood sugar control.   The need to take medication as prescribed, test blood sugar as directed, and to call between visits if there is a concern that blood sugar is uncontrolled is also discussed.   Mr. [name] is reminded of the importance of daily foot exam, annual eye examination, and good blood sugar, blood pressure and cholesterol control.     Latest Ref Rng & Units 01/29/2022   10:45 AM 08/30/2021    8:30 AM 07/29/2021   10:40 AM 01/28/2021   10:35 AM  01/22/2021   10:22 AM  Diabetic Labs  HbA1c 4.0 - 5.6 % 6.6   7.0  6.6    Chol 100 - 199 mg/dL  111      HDL >39 mg/dL  24      Calc LDL 0 - 99 mg/dL  60      Triglycerides 0 - 149 mg/dL  158      Creatinine 0.76 - 1.27 mg/dL  1.66    1.26       03/11/2022    8:56 AM 03/11/2022    8:55 AM 01/29/2022   10:30 AM 09/25/2021    3:59 PM 09/25/2021    3:18 PM 07/29/2021   10:30 AM 07/04/2021    2:54 PM  BP/Weight  Systolic BP 329 924 268 341 962 229 798  Diastolic BP 70 67 72 86 71 78 81  Wt. (Lbs)  268.04 266.6  268 269 270  BMI  38.46 kg/m2 38.25 kg/m2  38.45 kg/m2 38.6 kg/m2 38.74 kg/m2      Latest Ref Rng & Units 06/20/2021    8:00 AM 01/10/2021   12:00 AM  Foot/eye exam completion dates  Eye Exam No Retinopathy  No Retinopathy      Foot Form Completion  Done      This result is from an external source.     '

## 2022-03-11 NOTE — Assessment & Plan Note (Signed)
Compliance with enactment confirmed

## 2022-03-11 NOTE — Assessment & Plan Note (Signed)
Stable , asymptomatic, no use of nTG for years

## 2022-03-11 NOTE — Patient Instructions (Addendum)
Annual exam with MD in May , when due, call if you need me sooner  Flu vaccine in office today  Covid and shingrix vaccines past due  Labs today, Lipid, cmp and EGFr, uric acid , cBC and PSA  Microalb today  Careful not to fall  Thanks for choosing [facility], we consider it a privelige to serve you.

## 2022-03-11 NOTE — Assessment & Plan Note (Signed)
Home safety reviewed, no falls reported 

## 2022-03-11 NOTE — Assessment & Plan Note (Signed)
Not at goal, managed per Cardiology, no med change DASH diet and commitment to daily physical activity for a minimum of 30 minutes discussed and encouraged, as a part of hypertension management. The importance of attaining a healthy weight is also discussed.     03/11/2022    8:56 AM 03/11/2022    8:55 AM 01/29/2022   10:30 AM 09/25/2021    3:59 PM 09/25/2021    3:18 PM 07/29/2021   10:30 AM 07/04/2021    2:54 PM  BP/Weight  Systolic BP 430 148 403 979 536 922 300  Diastolic BP 70 67 72 86 71 78 81  Wt. (Lbs)  268.04 266.6  268 269 270  BMI  38.46 kg/m2 38.25 kg/m2  38.45 kg/m2 38.6 kg/m2 38.74 kg/m2

## 2022-03-11 NOTE — Assessment & Plan Note (Signed)
Controlled, no change in medication  

## 2022-03-11 NOTE — Assessment & Plan Note (Signed)
Hyperlipidemia:Low fat diet discussed and encouraged.   Lipid Panel  Lab Results  Component Value Date   CHOL 111 08/30/2021   HDL 24 (L) 08/30/2021   LDLCALC 60 08/30/2021   LDLDIRECT 50 08/18/2014   TRIG 158 (H) 08/30/2021   CHOLHDL 4.6 08/30/2021  not at goal Updated lab today.

## 2022-03-12 LAB — CMP14+EGFR
ALT: 8 IU/L (ref 0–44)
AST: 9 IU/L (ref 0–40)
Albumin/Globulin Ratio: 1.3 (ref 1.2–2.2)
Albumin: 3.9 g/dL (ref 3.8–4.8)
Alkaline Phosphatase: 86 IU/L (ref 44–121)
BUN/Creatinine Ratio: 14 (ref 10–24)
BUN: 19 mg/dL (ref 8–27)
Bilirubin Total: 0.2 mg/dL (ref 0.0–1.2)
CO2: 23 mmol/L (ref 20–29)
Calcium: 9.7 mg/dL (ref 8.6–10.2)
Chloride: 100 mmol/L (ref 96–106)
Creatinine, Ser: 1.34 mg/dL — ABNORMAL HIGH (ref 0.76–1.27)
Globulin, Total: 3 g/dL (ref 1.5–4.5)
Glucose: 154 mg/dL — ABNORMAL HIGH (ref 70–99)
Potassium: 4.4 mmol/L (ref 3.5–5.2)
Sodium: 137 mmol/L (ref 134–144)
Total Protein: 6.9 g/dL (ref 6.0–8.5)
eGFR: 54 mL/min/{1.73_m2} — ABNORMAL LOW (ref >59)

## 2022-03-12 LAB — URIC ACID: Uric Acid: 4.3 mg/dL (ref 3.8–8.4)

## 2022-03-12 LAB — CBC
Hematocrit: 36.3 % — ABNORMAL LOW (ref 37.5–51.0)
Hemoglobin: 11.9 g/dL — ABNORMAL LOW (ref 13.0–17.7)
MCH: 28 pg (ref 26.6–33.0)
MCHC: 32.8 g/dL (ref 31.5–35.7)
MCV: 85 fL (ref 79–97)
Platelets: 268 10*3/uL (ref 150–450)
RBC: 4.25 x10E6/uL (ref 4.14–5.80)
RDW: 14 % (ref 11.6–15.4)
WBC: 10.9 10*3/uL — ABNORMAL HIGH (ref 3.4–10.8)

## 2022-03-12 LAB — LIPID PANEL
Chol/HDL Ratio: 4.2 ratio (ref 0.0–5.0)
Cholesterol, Total: 118 mg/dL (ref 100–199)
HDL: 28 mg/dL — ABNORMAL LOW (ref >39)
LDL Chol Calc (NIH): 59 mg/dL (ref 0–99)
Triglycerides: 185 mg/dL — ABNORMAL HIGH (ref 0–149)
VLDL Cholesterol Cal: 31 mg/dL (ref 5–40)

## 2022-03-12 LAB — PSA: Prostate Specific Ag, Serum: 0.9 ng/mL (ref 0.0–4.0)

## 2022-03-31 DIAGNOSIS — I25118 Atherosclerotic heart disease of native coronary artery with other forms of angina pectoris: Secondary | ICD-10-CM | POA: Diagnosis not present

## 2022-03-31 DIAGNOSIS — I639 Cerebral infarction, unspecified: Secondary | ICD-10-CM | POA: Diagnosis not present

## 2022-03-31 DIAGNOSIS — E785 Hyperlipidemia, unspecified: Secondary | ICD-10-CM | POA: Diagnosis not present

## 2022-03-31 DIAGNOSIS — E119 Type 2 diabetes mellitus without complications: Secondary | ICD-10-CM | POA: Diagnosis not present

## 2022-03-31 DIAGNOSIS — I10 Essential (primary) hypertension: Secondary | ICD-10-CM | POA: Diagnosis not present

## 2022-04-03 ENCOUNTER — Emergency Department (HOSPITAL_COMMUNITY)
Admission: EM | Admit: 2022-04-03 | Discharge: 2022-04-03 | Disposition: A | Discharge status: Home / Self Care | Payer: Medicare HMO | Attending: Emergency Medicine | Admitting: Emergency Medicine

## 2022-04-03 ENCOUNTER — Encounter (HOSPITAL_COMMUNITY): Payer: Self-pay

## 2022-04-03 ENCOUNTER — Other Ambulatory Visit: Payer: Self-pay

## 2022-04-03 ENCOUNTER — Emergency Department (HOSPITAL_COMMUNITY): Payer: Medicare HMO

## 2022-04-03 DIAGNOSIS — Z7982 Long term (current) use of aspirin: Secondary | ICD-10-CM | POA: Insufficient documentation

## 2022-04-03 DIAGNOSIS — J449 Chronic obstructive pulmonary disease, unspecified: Secondary | ICD-10-CM | POA: Diagnosis not present

## 2022-04-03 DIAGNOSIS — R059 Cough, unspecified: Secondary | ICD-10-CM | POA: Diagnosis not present

## 2022-04-03 DIAGNOSIS — R0602 Shortness of breath: Secondary | ICD-10-CM | POA: Diagnosis not present

## 2022-04-03 DIAGNOSIS — Z7902 Long term (current) use of antithrombotics/antiplatelets: Secondary | ICD-10-CM | POA: Diagnosis not present

## 2022-04-03 DIAGNOSIS — U071 COVID-19: Secondary | ICD-10-CM | POA: Insufficient documentation

## 2022-04-03 LAB — CBC WITH DIFFERENTIAL/PLATELET
Abs Immature Granulocytes: 0.04 10*3/uL (ref 0.00–0.07)
Basophils Absolute: 0.1 10*3/uL (ref 0.0–0.1)
Basophils Relative: 1 %
Eosinophils Absolute: 0.1 10*3/uL (ref 0.0–0.5)
Eosinophils Relative: 1 %
HCT: 36.2 % — ABNORMAL LOW (ref 39.0–52.0)
Hemoglobin: 11.2 g/dL — ABNORMAL LOW (ref 13.0–17.0)
Immature Granulocytes: 0 %
Lymphocytes Relative: 12 %
Lymphs Abs: 1.2 10*3/uL (ref 0.7–4.0)
MCH: 27.4 pg (ref 26.0–34.0)
MCHC: 30.9 g/dL (ref 30.0–36.0)
MCV: 88.5 fL (ref 80.0–100.0)
Monocytes Absolute: 0.5 10*3/uL (ref 0.1–1.0)
Monocytes Relative: 5 %
Neutro Abs: 7.9 10*3/uL — ABNORMAL HIGH (ref 1.7–7.7)
Neutrophils Relative %: 81 %
Platelets: 247 10*3/uL (ref 150–400)
RBC: 4.09 MIL/uL — ABNORMAL LOW (ref 4.22–5.81)
RDW: 15.2 % (ref 11.5–15.5)
WBC: 9.8 10*3/uL (ref 4.0–10.5)
nRBC: 0 % (ref 0.0–0.2)

## 2022-04-03 LAB — COMPREHENSIVE METABOLIC PANEL
ALT: 9 U/L (ref 0–44)
AST: 17 U/L (ref 15–41)
Albumin: 3.5 g/dL (ref 3.5–5.0)
Alkaline Phosphatase: 60 U/L (ref 38–126)
Anion gap: 6 (ref 5–15)
BUN: 18 mg/dL (ref 8–23)
CO2: 27 mmol/L (ref 22–32)
Calcium: 8.7 mg/dL — ABNORMAL LOW (ref 8.9–10.3)
Chloride: 103 mmol/L (ref 98–111)
Creatinine, Ser: 1.24 mg/dL (ref 0.61–1.24)
GFR, Estimated: 59 mL/min — ABNORMAL LOW (ref >60)
Glucose, Bld: 113 mg/dL — ABNORMAL HIGH (ref 70–99)
Potassium: 4.3 mmol/L (ref 3.5–5.1)
Sodium: 136 mmol/L (ref 135–145)
Total Bilirubin: 0.6 mg/dL (ref 0.3–1.2)
Total Protein: 7.5 g/dL (ref 6.5–8.1)

## 2022-04-03 LAB — RESP PANEL BY RT-PCR (FLU A&B, COVID) ARPGX2
Influenza A by PCR: NEGATIVE
Influenza B by PCR: NEGATIVE
SARS Coronavirus 2 by RT PCR: POSITIVE — AB

## 2022-04-03 MED ORDER — METHYLPREDNISOLONE SODIUM SUCC 125 MG IJ SOLR
INTRAMUSCULAR | Status: AC
  Filled 2022-04-03: qty 2

## 2022-04-03 MED ORDER — PAXLOVID (300/100) 20 X 150 MG & 10 X 100MG PO TBPK: 3.0000 | ORAL_TABLET | Freq: Two times a day (BID) | ORAL | 0 refills | Status: DC

## 2022-04-03 MED ORDER — METHYLPREDNISOLONE SODIUM SUCC 125 MG IJ SOLR
125.0000 mg | Freq: Once | INTRAMUSCULAR | Status: AC
  Administered 2022-04-03: 125 mg via INTRAVENOUS
  Filled 2022-04-03: qty 2

## 2022-04-03 MED ORDER — IPRATROPIUM-ALBUTEROL 0.5-2.5 (3) MG/3ML IN SOLN
3.0000 mL | Freq: Once | RESPIRATORY_TRACT | Status: AC
  Administered 2022-04-03: 3 mL via RESPIRATORY_TRACT
  Filled 2022-04-03: qty 3

## 2022-04-03 MED ORDER — MAGNESIUM SULFATE 2 GM/50ML IV SOLN
2.0000 g | Freq: Once | INTRAVENOUS | Status: AC
  Administered 2022-04-03: 2 g via INTRAVENOUS
  Filled 2022-04-03: qty 50

## 2022-04-03 MED ORDER — ALBUTEROL SULFATE (2.5 MG/3ML) 0.083% IN NEBU
2.5000 mg | INHALATION_SOLUTION | Freq: Once | RESPIRATORY_TRACT | Status: AC
  Administered 2022-04-03: 2.5 mg via RESPIRATORY_TRACT
  Filled 2022-04-03: qty 3

## 2022-04-03 NOTE — ED Notes (Signed)
Respiratory assessment repeated. Overall improvement noted, although mild expiratory wheezing remains.

## 2022-04-03 NOTE — ED Triage Notes (Signed)
Pt reports worsening SHOB x 1 day. Pt reports new cough. Pt on 2L chronically.

## 2022-04-03 NOTE — ED Provider Notes (Signed)
[facility] EMERGENCY DEPARTMENT Provider Note   CSN: [id number] Arrival date & time: 04/03/22  1004     History  Chief Complaint  Patient presents with   Shortness of Breath    [name] is a 81 y.o. male.  Patient with COPD.  He complains of shortness of breath  The history is provided by the patient and medical records. No language interpreter was used.  Shortness of Breath Severity:  Mild Onset quality:  Sudden Timing:  Constant Progression:  Worsening Chronicity:  New Context: activity   Relieved by:  Nothing Worsened by:  Nothing Associated symptoms: no abdominal pain, no chest pain, no cough, no headaches and no rash        Home Medications Prior to Admission medications   Medication Sig Start Date End Date Taking? Authorizing Provider  acetaminophen (TYLENOL) 500 MG tablet Take 1,000 mg by mouth every 6 (six) hours as needed for moderate pain (left thigh).    Yes [provider]  albuterol (VENTOLIN HFA) 108 (90 Base) MCG/ACT inhaler INHALE 1 TO 2 PUFFS EVERY 6 HOURS AS NEEDED FOR WHEEZING OR SHORTNESS OF BREATH 03/05/22  Yes Fayrene Helper, MD  allopurinol (ZYLOPRIM) 300 MG tablet TAKE 1 TABLET EVERY DAY 08/15/21  Yes Fayrene Helper, MD  aspirin EC 81 MG tablet Take 81 mg by mouth daily.   Yes [provider]  budesonide-formoterol (SYMBICORT) 80-4.5 MCG/ACT inhaler Inhale 2 puffs into the lungs daily. 07/07/19  Yes Perlie Mayo, NP  clopidogrel (PLAVIX) 75 MG tablet TAKE 1 TABLET EVERY DAY 01/15/22  Yes Fayrene Helper, MD  ezetimibe (ZETIA) 10 MG tablet TAKE 1 TABLET EVERY DAY 03/05/22  Yes Fayrene Helper, MD  insulin NPH-regular Human (NOVOLIN 70/30) (70-30) 100 UNIT/ML injection INJECT 55 UNITS UNDER THE SKIN WITH BREAKFAST AND 45 UNITS WITH SUPPER. 07/30/21  Yes Brita Romp, NP  isosorbide mononitrate (IMDUR) 30 MG 24 hr tablet TAKE 1 TABLET EVERY DAY 11/21/21  Yes Fayrene Helper, MD  levETIRAcetam  (KEPPRA) 500 MG tablet TAKE 1 TABLET TWICE DAILY 03/05/22  Yes Fayrene Helper, MD  levothyroxine (SYNTHROID) 112 MCG tablet Take 1 tablet (112 mcg total) by mouth daily. 03/11/22  Yes Brita Romp, NP  metoprolol tartrate (LOPRESSOR) 50 MG tablet TAKE 1/2 TABLET TWICE DAILY 08/15/21  Yes Fayrene Helper, MD  nirmatrelvir & ritonavir (PAXLOVID, 300/100,) 20 x 150 MG & 10 x 100MG TBPK Take 3 tablets by mouth 2 (two) times daily. 04/03/22  Yes Milton Ferguson, MD  pantoprazole (PROTONIX) 40 MG tablet TAKE 1 TABLET EVERY DAY BEFORE BREAKFAST 03/05/22  Yes Fayrene Helper, MD  polyethylene glycol (MIRALAX / GLYCOLAX) 17 g packet Take 17 g by mouth daily as needed. 09/04/19  Yes [place] Eric J, DO  pravastatin (PRAVACHOL) 40 MG tablet TAKE 2 TABLETS EVERY DAY 03/05/22  Yes Fayrene Helper, MD  spironolactone (ALDACTONE) 50 MG tablet TAKE 1 TABLET EVERY DAY (DOSE INCREASE) 08/21/21  Yes Fayrene Helper, MD  vitamin B-12 (CYANOCOBALAMIN) 1000 MCG tablet Take 1,000 mcg by mouth daily.   Yes [provider]  Vitamin D, Ergocalciferol, (DRISDOL) 1.25 MG (50000 UNIT) CAPS capsule Take by mouth. 05/05/21  Yes [provider]  Blood Glucose Calibration (TRUE METRIX LEVEL 1) Low SOLN Use as directed 03/09/20   Fayrene Helper, MD  Blood Glucose Monitoring Suppl (TRUE METRIX METER) w/Device KIT USE AS DIRECTED 09/18/20   Fayrene Helper, MD  DROPLET  INSULIN SYRINGE 31G X 5/16" 1 ML MISC USE AS DIRECTED IN THE MORNING AND AT BEDTIME 06/11/20   Fayrene Helper, MD  glucose blood (TRUE METRIX BLOOD GLUCOSE TEST) test strip 4 times daily testing dx e11.65 01/14/21   Fayrene Helper, MD  nitroGLYCERIN (NITROSTAT) 0.4 MG SL tablet Place 1 tablet (0.4 mg total) under the tongue every 5 (five) minutes as needed for chest pain. 05/08/17 09/02/19  Charolette Forward, MD  TRUEplus Lancets 33G MISC TEST BLOOD SUGAR FOUR TIMES DAILY 01/06/22   Fayrene Helper, MD  UNABLE TO FIND  CPAP mask and tubing  DX G47.33 06/11/21   Fayrene Helper, MD      Allergies    Patient has no known allergies.    Review of Systems   Review of Systems  Constitutional:  Negative for appetite change and fatigue.  HENT:  Negative for congestion, ear discharge and sinus pressure.   Eyes:  Negative for discharge.  Respiratory:  Positive for shortness of breath. Negative for cough.   Cardiovascular:  Negative for chest pain.  Gastrointestinal:  Negative for abdominal pain and diarrhea.  Genitourinary:  Negative for frequency and hematuria.  Musculoskeletal:  Negative for back pain.  Skin:  Negative for rash.  Neurological:  Negative for seizures and headaches.  Psychiatric/Behavioral:  Negative for hallucinations.     Physical Exam Updated Vital Signs BP 136/70   Pulse (!) 103   Temp 98.4 F (36.9 C) (Oral)   Resp (!) 23   Ht _0  (1.778 m)   Wt 113.4 kg   SpO2 93%   BMI 35.87 kg/m  Physical Exam Vitals and nursing note reviewed.  Constitutional:      Appearance: He is well-developed.  HENT:     Head: Normocephalic.     Nose: Nose normal.  Eyes:     General: No scleral icterus.    Conjunctiva/sclera: Conjunctivae normal.  Neck:     Thyroid: No thyromegaly.  Cardiovascular:     Rate and Rhythm: Normal rate and regular rhythm.     Heart sounds: No murmur heard.    No friction rub. No gallop.  Pulmonary:     Breath sounds: No stridor. No wheezing or rales.  Chest:     Chest wall: No tenderness.  Abdominal:     General: There is no distension.     Tenderness: There is no abdominal tenderness. There is no rebound.  Musculoskeletal:        General: Normal range of motion.     Cervical back: Neck supple.  Lymphadenopathy:     Cervical: No cervical adenopathy.  Skin:    Findings: No erythema or rash.  Neurological:     Mental Status: He is alert and oriented to person, place, and time.     Motor: No abnormal muscle tone.     Coordination: Coordination  normal.  Psychiatric:        Behavior: Behavior normal.     ED Results / Procedures / Treatments   Labs (all labs ordered are listed, but only abnormal results are displayed) Labs Reviewed  RESP PANEL BY RT-PCR (FLU A&B, COVID) ARPGX2 - Abnormal; Notable for the following components:      Result Value   SARS Coronavirus 2 by RT PCR POSITIVE (*)    All other components within normal limits  CBC WITH DIFFERENTIAL/PLATELET - Abnormal; Notable for the following components:   RBC 4.09 (*)    Hemoglobin 11.2 (*)  HCT 36.2 (*)    Neutro Abs 7.9 (*)    All other components within normal limits  COMPREHENSIVE METABOLIC PANEL - Abnormal; Notable for the following components:   Glucose, Bld 113 (*)    Calcium 8.7 (*)    GFR, Estimated 59 (*)    All other components within normal limits    EKG None  Radiology DG Chest Port 1 View  Result Date: 04/03/2022 CLINICAL DATA:  Shortness of breath, cough EXAM: PORTABLE CHEST 1 VIEW COMPARISON:  Portable exam 1108 hours compared to 08/27/2019 FINDINGS: Enlargement of cardiac silhouette. Mediastinal contours and pulmonary vascularity normal. Lungs clear. No infiltrate, pleural effusion, or pneumothorax. Osseous structures unremarkable. IMPRESSION: Enlargement of cardiac silhouette. No acute abnormalities. Electronically Signed   By: Lavonia Dana M.D.   On: 04/03/2022 11:14    Procedures Procedures    Medications Ordered in ED Medications  methylPREDNISolone sodium succinate (SOLU-MEDROL) 125 mg/2 mL injection (has no administration in time range)  ipratropium-albuterol (DUONEB) 0.5-2.5 (3) MG/3ML nebulizer solution 3 mL (3 mLs Nebulization Given 04/03/22 1134)  albuterol (PROVENTIL) (2.5 MG/3ML) 0.083% nebulizer solution 2.5 mg (2.5 mg Nebulization Given 04/03/22 1134)  magnesium sulfate IVPB 2 g 50 mL (0 g Intravenous Stopped 04/03/22 1311)  methylPREDNISolone sodium succinate (SOLU-MEDROL) 125 mg/2 mL injection 125 mg (125 mg Intravenous  Given 04/03/22 1130)    ED Course/ Medical Decision Making/ A&P  Patient with COPD on 2 L.  He is improved with neb treatments.  He has a positive COVID and will be put on prophylactic                         Medical Decision Making Amount and/or Complexity of Data Reviewed Labs: ordered. Radiology: ordered. ECG/medicine tests: ordered.  Risk Prescription drug management.  This patient presents to the ED for concern of shortness of breath, this involves an extensive number of treatment options, and is a complaint that carries with it a high risk of complications and morbidity.  The differential diagnosis includes pneumonia, PE   Co morbidities that complicate the patient evaluation  COPD   Additional history obtained:  Additional history obtained from patient External records from outside source obtained and reviewed including hospital records   Lab Tests:  I Ordered, and personally interpreted labs.  The pertinent results include: COVID test positive, white count 9.8   Imaging Studies ordered:  I ordered imaging studies including chest x-ray I independently visualized and interpreted imaging which showed cardiomegaly I agree with the radiologist interpretation   Cardiac Monitoring: / EKG:  The patient was maintained on a cardiac monitor.  I personally viewed and interpreted the cardiac monitored which showed an underlying rhythm of: Normal sinus rhythm   Consultations Obtained: No consultant  Problem List / ED Course / Critical interventions / Medication management  COPD and COVID I ordered medication including Paxlovid Reevaluation of the patient after these medicines showed that the patient stayed the same I have reviewed the patients home medicines and have made adjustments as needed   Social Determinants of Health:  None   Test / Admission - Considered:  None  Patient with COPD exacerbation and COVID.  He is placed on Paxlovid and will follow-up  with PCP        Final Clinical Impression(s) / ED Diagnoses Final diagnoses:  COVID    Rx / DC Orders ED Discharge Orders          Ordered  nirmatrelvir & ritonavir (PAXLOVID, 300/100,) 20 x 150 MG & 10 x 100MG TBPK  2 times daily        04/03/22 1434              Milton Ferguson, MD 04/04/22 1800

## 2022-04-03 NOTE — Discharge Instructions (Signed)
Stop taking your pravastatin until you finish the new medicine.  Use your inhalers and follow-up with your PCP next week if any problems

## 2022-04-09 ENCOUNTER — Emergency Department (HOSPITAL_COMMUNITY)
Admission: EM | Admit: 2022-04-09 | Discharge: 2022-04-09 | Disposition: A | Discharge status: Home / Self Care | Payer: Medicare HMO | Attending: Emergency Medicine | Admitting: Emergency Medicine

## 2022-04-09 ENCOUNTER — Other Ambulatory Visit: Payer: Self-pay

## 2022-04-09 DIAGNOSIS — I509 Heart failure, unspecified: Secondary | ICD-10-CM | POA: Insufficient documentation

## 2022-04-09 DIAGNOSIS — Z7902 Long term (current) use of antithrombotics/antiplatelets: Secondary | ICD-10-CM | POA: Diagnosis not present

## 2022-04-09 DIAGNOSIS — S161XXA Strain of muscle, fascia and tendon at neck level, initial encounter: Secondary | ICD-10-CM

## 2022-04-09 DIAGNOSIS — Z794 Long term (current) use of insulin: Secondary | ICD-10-CM | POA: Insufficient documentation

## 2022-04-09 DIAGNOSIS — Z79899 Other long term (current) drug therapy: Secondary | ICD-10-CM | POA: Diagnosis not present

## 2022-04-09 DIAGNOSIS — E119 Type 2 diabetes mellitus without complications: Secondary | ICD-10-CM | POA: Insufficient documentation

## 2022-04-09 DIAGNOSIS — I110 Hypertensive heart disease with heart failure: Secondary | ICD-10-CM | POA: Insufficient documentation

## 2022-04-09 DIAGNOSIS — Z7982 Long term (current) use of aspirin: Secondary | ICD-10-CM | POA: Diagnosis not present

## 2022-04-09 DIAGNOSIS — J449 Chronic obstructive pulmonary disease, unspecified: Secondary | ICD-10-CM | POA: Diagnosis not present

## 2022-04-09 DIAGNOSIS — Z7951 Long term (current) use of inhaled steroids: Secondary | ICD-10-CM | POA: Diagnosis not present

## 2022-04-09 DIAGNOSIS — M542 Cervicalgia: Secondary | ICD-10-CM | POA: Diagnosis not present

## 2022-04-09 DIAGNOSIS — M62838 Other muscle spasm: Secondary | ICD-10-CM | POA: Diagnosis not present

## 2022-04-09 MED ORDER — METHOCARBAMOL 500 MG PO TABS: 500.0000 mg | ORAL_TABLET | Freq: Two times a day (BID) | ORAL | 0 refills | Status: DC

## 2022-04-09 NOTE — ED Triage Notes (Signed)
Pt c/o neck pain ever since he woke up this morning. Pt reports he was diagnosed with Covid last week. Denies injury.

## 2022-04-09 NOTE — ED Provider Notes (Signed)
[facility] EMERGENCY DEPARTMENT Provider Note   CSN: [id number] Arrival date & time: 04/09/22  1704     History  Chief Complaint  Patient presents with   Neck Pain    [name] is a 81 y.o. male who presents with his wife with past medical history of CVA with residual right-sided deficits, anemia, MI, CHF, COPD, diabetes, hyperlipidemia, hypertension who presents with left-sided lateral neck pain at the base of his neck that extends out toward his shoulder.  He states that he woke up with this pain this morning.  At first, he only complains of the left-sided pain but later during the interview he does confirm that he has similar pain on the right side of his neck in the same location.  He denies any injury to the neck, head, or elsewhere on his body, recent fall, history of this neck pain, radiation down either arm, change in activity level though he states he is very sedentary, fever, visual change, new onset weakness or paresthesias.  He is recovering from a positive COVID test on November 16 for which he states his symptoms were mild upper respiratory symptoms and did not include any difficulty breathing.  He is on home oxygen but has not needed to increase his requirement.  States neck pain is worse with turning the head to the left.  He has not tried to treat his pain at home with any medication.      Home Medications Prior to Admission medications   Medication Sig Start Date End Date Taking? Authorizing Provider  acetaminophen (TYLENOL) 500 MG tablet Take 1,000 mg by mouth every 6 (six) hours as needed for moderate pain (left thigh).     [provider]  albuterol (VENTOLIN HFA) 108 (90 Base) MCG/ACT inhaler INHALE 1 TO 2 PUFFS EVERY 6 HOURS AS NEEDED FOR WHEEZING OR SHORTNESS OF BREATH 03/05/22   Fayrene Helper, MD  allopurinol (ZYLOPRIM) 300 MG tablet TAKE 1 TABLET EVERY DAY 08/15/21   Fayrene Helper, MD  aspirin EC 81 MG tablet Take 81 mg by mouth daily.     [provider]  Blood Glucose Calibration (TRUE METRIX LEVEL 1) Low SOLN Use as directed 03/09/20   Fayrene Helper, MD  Blood Glucose Monitoring Suppl (TRUE METRIX METER) w/Device KIT USE AS DIRECTED 09/18/20   Fayrene Helper, MD  budesonide-formoterol [facility]) 80-4.5 MCG/ACT inhaler Inhale 2 puffs into the lungs daily. 07/07/19   Perlie Mayo, NP  clopidogrel (PLAVIX) 75 MG tablet TAKE 1 TABLET EVERY DAY 01/15/22   Fayrene Helper, MD  DROPLET INSULIN SYRINGE 31G X 5/16" 1 ML MISC USE AS DIRECTED IN THE MORNING AND AT BEDTIME 06/11/20   Fayrene Helper, MD  ezetimibe (ZETIA) 10 MG tablet TAKE 1 TABLET EVERY DAY 03/05/22   Fayrene Helper, MD  glucose blood (TRUE METRIX BLOOD GLUCOSE TEST) test strip 4 times daily testing dx e11.65 01/14/21   Fayrene Helper, MD  insulin NPH-regular Human (NOVOLIN 70/30) (70-30) 100 UNIT/ML injection INJECT 55 UNITS UNDER THE SKIN WITH BREAKFAST AND 45 UNITS WITH SUPPER. 07/30/21   Brita Romp, NP  isosorbide mononitrate (IMDUR) 30 MG 24 hr tablet TAKE 1 TABLET EVERY DAY 11/21/21   Fayrene Helper, MD  levETIRAcetam (KEPPRA) 500 MG tablet TAKE 1 TABLET TWICE DAILY 03/05/22   Fayrene Helper, MD  levothyroxine (SYNTHROID) 112 MCG tablet Take 1 tablet (112 mcg total) by mouth daily. 03/11/22   Rayetta Pigg  J, NP  metoprolol tartrate (LOPRESSOR) 50 MG tablet TAKE 1/2 TABLET TWICE DAILY 08/15/21   Fayrene Helper, MD  nirmatrelvir & ritonavir (PAXLOVID, 300/100,) 20 x 150 MG & 10 x 100MG TBPK Take 3 tablets by mouth 2 (two) times daily. 04/03/22   Milton Ferguson, MD  nitroGLYCERIN (NITROSTAT) 0.4 MG SL tablet Place 1 tablet (0.4 mg total) under the tongue every 5 (five) minutes as needed for chest pain. 05/08/17 09/02/19  Charolette Forward, MD  pantoprazole (PROTONIX) 40 MG tablet TAKE 1 TABLET EVERY DAY BEFORE BREAKFAST 03/05/22   Fayrene Helper, MD  polyethylene glycol (MIRALAX / GLYCOLAX) 17 g packet Take 17 g  by mouth daily as needed. 09/04/19   [place] Donnamarie Poag, DO  pravastatin (PRAVACHOL) 40 MG tablet TAKE 2 TABLETS EVERY DAY 03/05/22   Fayrene Helper, MD  spironolactone (ALDACTONE) 50 MG tablet TAKE 1 TABLET EVERY DAY (DOSE INCREASE) 08/21/21   Fayrene Helper, MD  TRUEplus Lancets 33G MISC TEST BLOOD SUGAR FOUR TIMES DAILY 01/06/22   Fayrene Helper, MD  UNABLE TO FIND CPAP mask and tubing  DX G47.33 06/11/21   Fayrene Helper, MD  vitamin B-12 (CYANOCOBALAMIN) 1000 MCG tablet Take 1,000 mcg by mouth daily.    [provider]  Vitamin D, Ergocalciferol, (DRISDOL) 1.25 MG (50000 UNIT) CAPS capsule Take by mouth. 05/05/21   [provider]      Allergies    Patient has no known allergies.    Review of Systems   Review of Systems  Constitutional:  Negative for appetite change, chills and fever.  HENT:  Negative for ear pain and sore throat.   Eyes:  Negative for pain and visual disturbance.  Respiratory:  Negative for cough and shortness of breath.   Cardiovascular:  Negative for chest pain and palpitations.  Gastrointestinal:  Negative for abdominal pain, diarrhea, nausea and vomiting.  Genitourinary:  Negative for dysuria and hematuria.  Musculoskeletal:  Positive for neck pain. Negative for back pain and joint swelling.  Skin:  Negative for color change, rash and wound.  Neurological:  Negative for dizziness, seizures, syncope, speech difficulty, light-headedness, numbness and headaches.  All other systems reviewed and are negative.   Physical Exam Updated Vital Signs BP (!) 148/78 (BP Location: Right Arm)   Pulse 66   Temp 98.1 F (36.7 C) (Oral)   Resp 19   Ht _0  (1.778 m)   Wt 120.2 kg   SpO2 91%   BMI 38.02 kg/m  Physical Exam Vitals and nursing note reviewed.  Constitutional:      General: He is not in acute distress.    Appearance: He is well-developed. He is obese.  HENT:     Head: Normocephalic and atraumatic.  Eyes:      Conjunctiva/sclera: Conjunctivae normal.  Neck:     Comments: No midline bony tenderness, moderate tenderness to bilateral trapezius muscles, decreased range of motion with turning head to the left secondary to pain Cardiovascular:     Rate and Rhythm: Normal rate and regular rhythm.     Heart sounds: Normal heart sounds. No murmur heard. Pulmonary:     Effort: Pulmonary effort is normal. No respiratory distress.     Breath sounds: Normal breath sounds. No wheezing, rhonchi or rales.  Abdominal:     General: Abdomen is flat.     Palpations: Abdomen is soft.     Tenderness: There is no abdominal tenderness.  Musculoskeletal:     Right  lower leg: No edema.     Left lower leg: No edema.  Skin:    General: Skin is warm and dry.     Capillary Refill: Capillary refill takes less than 2 seconds.     Findings: No bruising or erythema.  Neurological:     Mental Status: He is alert and oriented to person, place, and time.     Cranial Nerves: No cranial nerve deficit.     Comments: Patient has 4 out of 5 strength of the right upper extremity, 3 out of 5 strength of the right lower extremity, 5 out of 5 strength in the left upper and lower extremity, patient reports diminished sensation to the left distal lower extremity which is at his baseline since his previous stroke, normal speech  Psychiatric:        Mood and Affect: Mood normal.        Behavior: Behavior normal.        Thought Content: Thought content normal.     ED Results / Procedures / Treatments   Labs (all labs ordered are listed, but only abnormal results are displayed) None  EKG None  Radiology None  Procedures  Medications Ordered in ED None  ED Course/ Medical Decision Making/ A&P                           Medical Decision Making Patient presents with 1 day of bilateral trapezius muscle pain without any injury to the neck or any bony tenderness on exam.  His presentation is consistent with a muscle strain.  He  has not tried any medication at home to help with the symptoms.  With his recent diagnosis of COVID and recovery, he reports he is still sedentary though pretty much at his baseline as he does not get up and move around much at home.  After his prior stroke, he did not complete physical therapy and states that the physical therapist was not working with him except for a brief time when she did come to their home.  Discussed that his lack of activity will likely contribute to worsening of his symptoms that he presents with today.  Discussed likelihood of needing to attend physical therapy again, however, he does not want to move ahead with this approach at this time.  Will prescribe Robaxin to help with his muscle tension and also recommend Tylenol for pain and Voltaren gel to be applied topically to further assist with pain.  Recommended slow increase in activity levels as tolerated. Aware that he should follow-up with his primary care for further recommendations and setting up PT to assist with his chronic weakness as well as his neck pain that he presents with today especially if it continues.  He has no signs of new neurological deficits and without history of injury do not feel that imaging is indicated at this time.  Discussed case with supervising physician who also evaluated patient and agrees with plan.  Risk Prescription drug management.            Final Clinical Impression(s) / ED Diagnoses Final diagnoses:  None    Rx / DC Orders ED Discharge Orders     None         Turner Daniels 04/09/22 2027    Noemi Chapel, MD 04/10/22 [id number]

## 2022-04-09 NOTE — Discharge Instructions (Addendum)
Thank you for letting us care for you today.  We are treating you for a strain of your trapezius muscles. It is very important you continue with light activity to help improve your symptoms. Please take the muscle relaxer as prescribed to help with your symptoms as well as Tylenol. You may also use topical voltaren gel to help with the pain.  If symptoms do not resolve or start improving, please follow-up with your family doctor for further treatment. You may need physical therapy to help your condition.

## 2022-04-28 DIAGNOSIS — H5203 Hypermetropia, bilateral: Secondary | ICD-10-CM | POA: Diagnosis not present

## 2022-05-17 ENCOUNTER — Other Ambulatory Visit: Payer: Self-pay | Admitting: Nurse Practitioner

## 2022-05-17 ENCOUNTER — Other Ambulatory Visit: Payer: Self-pay | Admitting: Family Medicine

## 2022-05-23 ENCOUNTER — Other Ambulatory Visit: Payer: Self-pay | Admitting: Nurse Practitioner

## 2022-05-23 DIAGNOSIS — E039 Hypothyroidism, unspecified: Secondary | ICD-10-CM

## 2022-06-05 ENCOUNTER — Other Ambulatory Visit: Payer: Self-pay | Admitting: Family Medicine

## 2022-07-09 ENCOUNTER — Emergency Department (HOSPITAL_COMMUNITY): Payer: Medicare HMO

## 2022-07-09 ENCOUNTER — Emergency Department (HOSPITAL_COMMUNITY)
Admission: EM | Admit: 2022-07-09 | Discharge: 2022-07-09 | Disposition: A | Discharge status: Home / Self Care | Payer: Medicare HMO | Attending: Emergency Medicine | Admitting: Emergency Medicine

## 2022-07-09 ENCOUNTER — Other Ambulatory Visit: Payer: Self-pay

## 2022-07-09 ENCOUNTER — Encounter (HOSPITAL_COMMUNITY): Payer: Self-pay | Admitting: Emergency Medicine

## 2022-07-09 DIAGNOSIS — Z794 Long term (current) use of insulin: Secondary | ICD-10-CM | POA: Insufficient documentation

## 2022-07-09 DIAGNOSIS — R1013 Epigastric pain: Secondary | ICD-10-CM | POA: Insufficient documentation

## 2022-07-09 DIAGNOSIS — Z7982 Long term (current) use of aspirin: Secondary | ICD-10-CM | POA: Diagnosis not present

## 2022-07-09 DIAGNOSIS — N281 Cyst of kidney, acquired: Secondary | ICD-10-CM | POA: Diagnosis not present

## 2022-07-09 DIAGNOSIS — R109 Unspecified abdominal pain: Secondary | ICD-10-CM | POA: Diagnosis not present

## 2022-07-09 DIAGNOSIS — Z7902 Long term (current) use of antithrombotics/antiplatelets: Secondary | ICD-10-CM | POA: Insufficient documentation

## 2022-07-09 LAB — CBC
HCT: 38.6 % — ABNORMAL LOW (ref 39.0–52.0)
Hemoglobin: 12.1 g/dL — ABNORMAL LOW (ref 13.0–17.0)
MCH: 27.3 pg (ref 26.0–34.0)
MCHC: 31.3 g/dL (ref 30.0–36.0)
MCV: 87.1 fL (ref 80.0–100.0)
Platelets: 270 10*3/uL (ref 150–400)
RBC: 4.43 MIL/uL (ref 4.22–5.81)
RDW: 15.4 % (ref 11.5–15.5)
WBC: 10.7 10*3/uL — ABNORMAL HIGH (ref 4.0–10.5)
nRBC: 0 % (ref 0.0–0.2)

## 2022-07-09 LAB — URINALYSIS, ROUTINE W REFLEX MICROSCOPIC
Bilirubin Urine: NEGATIVE
Glucose, UA: NEGATIVE mg/dL
Ketones, ur: NEGATIVE mg/dL
Nitrite: NEGATIVE
Protein, ur: 30 mg/dL — AB
Specific Gravity, Urine: 1.02 (ref 1.005–1.030)
pH: 6 (ref 5.0–8.0)

## 2022-07-09 LAB — COMPREHENSIVE METABOLIC PANEL
ALT: 10 U/L (ref 0–44)
AST: 15 U/L (ref 15–41)
Albumin: 3.7 g/dL (ref 3.5–5.0)
Alkaline Phosphatase: 65 U/L (ref 38–126)
Anion gap: 9 (ref 5–15)
BUN: 20 mg/dL (ref 8–23)
CO2: 25 mmol/L (ref 22–32)
Calcium: 9.2 mg/dL (ref 8.9–10.3)
Chloride: 100 mmol/L (ref 98–111)
Creatinine, Ser: 1.32 mg/dL — ABNORMAL HIGH (ref 0.61–1.24)
GFR, Estimated: 55 mL/min — ABNORMAL LOW (ref >60)
Glucose, Bld: 167 mg/dL — ABNORMAL HIGH (ref 70–99)
Potassium: 4.3 mmol/L (ref 3.5–5.1)
Sodium: 134 mmol/L — ABNORMAL LOW (ref 135–145)
Total Bilirubin: 0.2 mg/dL — ABNORMAL LOW (ref 0.3–1.2)
Total Protein: 7.7 g/dL (ref 6.5–8.1)

## 2022-07-09 LAB — URINALYSIS, MICROSCOPIC (REFLEX)
Bacteria, UA: NONE SEEN
RBC / HPF: >50 RBC/hpf (ref 0–5)
Squamous Epithelial / HPF: NONE SEEN /HPF (ref 0–5)

## 2022-07-09 LAB — LIPASE, BLOOD: Lipase: 54 U/L — ABNORMAL HIGH (ref 11–51)

## 2022-07-09 MED ORDER — IOHEXOL 300 MG/ML  SOLN
100.0000 mL | Freq: Once | INTRAMUSCULAR | Status: AC | PRN
  Administered 2022-07-09: 100 mL via INTRAVENOUS

## 2022-07-09 MED ORDER — FAMOTIDINE 20 MG PO TABS: 20.0000 mg | ORAL_TABLET | Freq: Two times a day (BID) | ORAL | 0 refills | Status: DC

## 2022-07-09 MED ORDER — PANTOPRAZOLE SODIUM 40 MG IV SOLR
40.0000 mg | Freq: Once | INTRAVENOUS | Status: AC
  Administered 2022-07-09: 40 mg via INTRAVENOUS
  Filled 2022-07-09: qty 10

## 2022-07-09 MED ORDER — ALUM & MAG HYDROXIDE-SIMETH 200-200-20 MG/5ML PO SUSP
30.0000 mL | Freq: Once | ORAL | Status: AC
  Administered 2022-07-09: 30 mL via ORAL
  Filled 2022-07-09: qty 30

## 2022-07-09 MED ORDER — LIDOCAINE VISCOUS HCL 2 % MT SOLN
15.0000 mL | Freq: Once | OROMUCOSAL | Status: AC
  Administered 2022-07-09: 15 mL via ORAL
  Filled 2022-07-09: qty 15

## 2022-07-09 MED ORDER — SUCRALFATE 1 GM/10ML PO SUSP: 1.0000 g | Freq: Three times a day (TID) | ORAL | 0 refills | Status: DC

## 2022-07-09 MED ORDER — FENTANYL CITRATE (PF) 100 MCG/2ML IJ SOLN
100.0000 ug | Freq: Once | INTRAMUSCULAR | Status: AC
  Administered 2022-07-09: 100 ug via INTRAVENOUS
  Filled 2022-07-09: qty 2

## 2022-07-09 NOTE — ED Triage Notes (Signed)
Pt states he was getting ready for bed when he burped and then " a little while later" he vomited liquid. States he has vomited 4-5 times starting about 0145. Pt also c/o R upper abdominal pain x 1 week.

## 2022-07-09 NOTE — ED Provider Notes (Signed)
[place] Provider Note   CSN: [id number] Arrival date & time: 07/09/22  0141     History  Chief Complaint  Patient presents with   Abdominal Pain   Emesis    [name] is a 82 y.o. male.  82 year old male who presents ER today with epigastric pain.  Patient states that he has been having it for about a week now.  Has history of indigestion and has been take his medications that he supposed to.  States that he got worse tonight and seems to have some right upper quadrant pain with that 2.  Has some nausea and emesis.  This is since resolved but still has a discomfort.  No history of cardiac issues.  Plan medications.  No fevers, diarrhea or constipation.   Abdominal Pain Associated symptoms: vomiting   Emesis Associated symptoms: abdominal pain        Home Medications Prior to Admission medications   Medication Sig Start Date End Date Taking? Authorizing Provider  famotidine (PEPCID) 20 MG tablet Take 1 tablet (20 mg total) by mouth 2 (two) times daily. 07/09/22  Yes Tameca Jerez, Corene Cornea, MD  sucralfate (CARAFATE) 1 GM/10ML suspension Take 10 mLs (1 g total) by mouth 4 (four) times daily -  with meals and at bedtime. 07/09/22  Yes Jinelle Butchko, Corene Cornea, MD  acetaminophen (TYLENOL) 500 MG tablet Take 1,000 mg by mouth every 6 (six) hours as needed for moderate pain (left thigh).     [provider]  albuterol (VENTOLIN HFA) 108 (90 Base) MCG/ACT inhaler INHALE 1 TO 2 PUFFS EVERY 6 HOURS AS NEEDED FOR WHEEZING OR SHORTNESS OF BREATH 03/05/22   Fayrene Helper, MD  allopurinol (ZYLOPRIM) 300 MG tablet TAKE 1 TABLET EVERY DAY 08/15/21   Fayrene Helper, MD  aspirin EC 81 MG tablet Take 81 mg by mouth daily.    [provider]  Blood Glucose Calibration (TRUE METRIX LEVEL 1) Low SOLN Use as directed 03/09/20   Fayrene Helper, MD  Blood Glucose Monitoring Suppl (TRUE METRIX METER) w/Device KIT USE AS DIRECTED 09/18/20    Fayrene Helper, MD  budesonide-formoterol [facility]) 80-4.5 MCG/ACT inhaler Inhale 2 puffs into the lungs daily. 07/07/19   Perlie Mayo, NP  clopidogrel (PLAVIX) 75 MG tablet TAKE 1 TABLET EVERY DAY 06/05/22   Fayrene Helper, MD  DROPLET INSULIN SYRINGE 31G X 5/16" 1 ML MISC USE AS DIRECTED IN THE MORNING AND AT BEDTIME 06/11/20   Fayrene Helper, MD  ezetimibe (ZETIA) 10 MG tablet TAKE 1 TABLET EVERY DAY 03/05/22   Fayrene Helper, MD  glucose blood (TRUE METRIX BLOOD GLUCOSE TEST) test strip 4 times daily testing dx e11.65 01/14/21   Fayrene Helper, MD  isosorbide mononitrate (IMDUR) 30 MG 24 hr tablet TAKE 1 TABLET EVERY DAY 11/21/21   Fayrene Helper, MD  levETIRAcetam (KEPPRA) 500 MG tablet TAKE 1 TABLET TWICE DAILY 03/05/22   Fayrene Helper, MD  levothyroxine (SYNTHROID) 112 MCG tablet TAKE 1 TABLET EVERY DAY 05/23/22   Brita Romp, NP  methocarbamol (ROBAXIN) 500 MG tablet Take 1 tablet (500 mg total) by mouth 2 (two) times daily. 04/09/22   Gowens, Mariah L, PA-C  metoprolol tartrate (LOPRESSOR) 50 MG tablet TAKE 1/2 TABLET TWICE DAILY 08/15/21   Fayrene Helper, MD  nirmatrelvir & ritonavir (PAXLOVID, 300/100,) 20 x 150 MG & 10 x 100MG TBPK Take 3 tablets by mouth 2 (two) times  daily. 04/03/22   Milton Ferguson, MD  nitroGLYCERIN (NITROSTAT) 0.4 MG SL tablet Place 1 tablet (0.4 mg total) under the tongue every 5 (five) minutes as needed for chest pain. 05/08/17 09/02/19  Charolette Forward, MD  NOVOLIN 70/30 (70-30) 100 UNIT/ML injection INJECT 55 UNITS UNDER THE SKIN WITH BREAKFAST AND 45 UNITS WITH SUPPER. 05/20/22   Brita Romp, NP  pantoprazole (PROTONIX) 40 MG tablet TAKE 1 TABLET EVERY DAY BEFORE BREAKFAST 03/05/22   Fayrene Helper, MD  polyethylene glycol (MIRALAX / GLYCOLAX) 17 g packet Take 17 g by mouth daily as needed. 09/04/19   [place] Donnamarie Poag, DO  pravastatin (PRAVACHOL) 40 MG tablet TAKE 2 TABLETS EVERY DAY 03/05/22   Fayrene Helper, MD  spironolactone (ALDACTONE) 50 MG tablet TAKE 1 TABLET EVERY DAY (DOSE INCREASE) 05/20/22   Fayrene Helper, MD  TRUEplus Lancets 33G MISC TEST BLOOD SUGAR FOUR TIMES DAILY 01/06/22   Fayrene Helper, MD  UNABLE TO FIND CPAP mask and tubing  DX G47.33 06/11/21   Fayrene Helper, MD  vitamin B-12 (CYANOCOBALAMIN) 1000 MCG tablet Take 1,000 mcg by mouth daily.    [provider]  Vitamin D, Ergocalciferol, (DRISDOL) 1.25 MG (50000 UNIT) CAPS capsule Take by mouth. 05/05/21   [provider]      Allergies    Patient has no known allergies.    Review of Systems   Review of Systems  Gastrointestinal:  Positive for abdominal pain and vomiting.    Physical Exam Updated Vital Signs BP (!) 164/82   Pulse 74   Temp 98.1 F (36.7 C) (Oral)   Resp 18   Ht 5' 10"$  (1.778 m)   Wt 114.3 kg   SpO2 90%   BMI 36.16 kg/m  Physical Exam Vitals and nursing note reviewed.  Constitutional:      Appearance: He is well-developed.  HENT:     Head: Normocephalic and atraumatic.  Cardiovascular:     Rate and Rhythm: Normal rate.  Pulmonary:     Effort: Pulmonary effort is normal. No respiratory distress.  Abdominal:     General: There is no distension.  Genitourinary:    Rectum: Normal.  Musculoskeletal:        General: Normal range of motion.     Cervical back: Normal range of motion.  Skin:    General: Skin is warm and dry.  Neurological:     General: No focal deficit present.     Mental Status: He is alert.     ED Results / Procedures / Treatments   Labs (all labs ordered are listed, but only abnormal results are displayed) Labs Reviewed  LIPASE, BLOOD - Abnormal; Notable for the following components:      Result Value   Lipase 54 (*)    All other components within normal limits  COMPREHENSIVE METABOLIC PANEL - Abnormal; Notable for the following components:   Sodium 134 (*)    Glucose, Bld 167 (*)    Creatinine, Ser 1.32 (*)    Total Bilirubin  0.2 (*)    GFR, Estimated 55 (*)    All other components within normal limits  CBC - Abnormal; Notable for the following components:   WBC 10.7 (*)    Hemoglobin 12.1 (*)    HCT 38.6 (*)    All other components within normal limits  URINALYSIS, ROUTINE W REFLEX MICROSCOPIC - Abnormal; Notable for the following components:   APPearance HAZY (*)    Hgb  urine dipstick LARGE (*)    Protein, ur 30 (*)    Leukocytes,Ua TRACE (*)    All other components within normal limits  URINALYSIS, MICROSCOPIC (REFLEX)    EKG None  Radiology CT ABDOMEN PELVIS W CONTRAST  Result Date: 07/09/2022 CLINICAL DATA:  82 year old male with history of acute onset of nonlocalized abdominal pain. Vomiting. EXAM: CT ABDOMEN AND PELVIS WITH CONTRAST TECHNIQUE: Multidetector CT imaging of the abdomen and pelvis was performed using the standard protocol following bolus administration of intravenous contrast. RADIATION DOSE REDUCTION: This exam was performed according to the departmental dose-optimization program which includes automated exposure control, adjustment of the mA and/or kV according to patient size and/or use of iterative reconstruction technique. CONTRAST:  185m OMNIPAQUE IOHEXOL 300 MG/ML  SOLN COMPARISON:  CT of the abdomen and pelvis 09/02/2019. FINDINGS: Lower chest: Scattered areas of cylindrical bronchiectasis, bronchial wall thickening, thickening of the peribronchovascular interstitium and mucoid impaction noted in the lower lobes of the lungs bilaterally. Atherosclerotic calcifications in the descending thoracic aorta as well as the left circumflex and right coronary arteries. Hepatobiliary: No suspicious cystic or solid hepatic lesions. No intra or extrahepatic biliary ductal dilatation. Gallbladder is unremarkable in appearance. Pancreas: No pancreatic mass. No pancreatic ductal dilatation. No pancreatic or peripancreatic fluid collections or inflammatory changes. Spleen: Unremarkable. Adrenals/Urinary  Tract: 5.7 cm low-attenuation lesion in the upper pole of the right kidney is compatible with a simple cyst. A 1.7 cm simple cyst is also noted in the interpolar region of the left kidney. No imaging follow-up for either of these lesions is recommended. Bilateral adrenal glands are normal in appearance. No hydroureteronephrosis. Urinary bladder is normal in appearance. Stomach/Bowel: The appearance of the stomach is unremarkable. There is no pathologic dilatation of small bowel or colon. Normal appendix. Vascular/Lymphatic: Extensive aortic atherosclerosis, without evidence of aneurysm or dissection in the abdominal or pelvic vasculature. No lymphadenopathy noted in the abdomen or pelvis. Reproductive: Prostate gland and seminal vesicles are unremarkable in appearance. Other: No significant volume of ascites.  No pneumoperitoneum. Musculoskeletal: There are no aggressive appearing lytic or blastic lesions noted in the visualized portions of the skeleton. IMPRESSION: 1. No acute findings are noted in the abdomen or pelvis to account for the patient's symptoms. 2. Probable areas of chronic post infectious scarring and mild bronchiectasis in the lower lobes of the lungs bilaterally. Clinical correlation for signs and symptoms of recurrent aspiration is suggested. 3. Aortic atherosclerosis, in addition to two-vessel coronary artery disease. 4. Additional incidental findings, as above. Electronically Signed   By: DVinnie LangtonM.D.   On: 07/09/2022 05:39    Procedures Procedures    Medications Ordered in ED Medications  fentaNYL (SUBLIMAZE) injection 100 mcg (100 mcg Intravenous Given 07/09/22 0439)  pantoprazole (PROTONIX) injection 40 mg (40 mg Intravenous Given 07/09/22 0438)  alum & mag hydroxide-simeth (MAALOX/MYLANTA) 200-200-20 MG/5ML suspension 30 mL (30 mLs Oral Given 07/09/22 0438)    And  lidocaine (XYLOCAINE) 2 % viscous mouth solution 15 mL (15 mLs Oral Given 07/09/22 0438)  iohexol (OMNIPAQUE)  300 MG/ML solution 100 mL (100 mLs Intravenous Contrast Given 07/09/22 0500)    ED Course/ Medical Decision Making/ A&P                             Medical Decision Making Amount and/or Complexity of Data Reviewed Labs: ordered. Radiology: ordered.  Risk OTC drugs. Prescription drug management.   Symptoms controlled with medications here.  CT scan negative.  On my interpretation of the CT scan does appear he has some fluid in his stomach and thickened gastric wall, seems consistent with gastritis.  Will treat for same.  PCP/GI follow-up if not continuing to improve.   Final Clinical Impression(s) / ED Diagnoses Final diagnoses:  Epigastric pain    Rx / DC Orders ED Discharge Orders          Ordered    sucralfate (CARAFATE) 1 GM/10ML suspension  3 times daily with meals & bedtime        07/09/22 0556    famotidine (PEPCID) 20 MG tablet  2 times daily        07/09/22 0556              Marabella Popiel, Corene Cornea, MD 07/09/22 2305

## 2022-07-15 ENCOUNTER — Ambulatory Visit (INDEPENDENT_AMBULATORY_CARE_PROVIDER_SITE_OTHER): Payer: Medicare HMO | Admitting: Internal Medicine

## 2022-07-15 ENCOUNTER — Encounter: Payer: Self-pay | Admitting: Internal Medicine

## 2022-07-15 ENCOUNTER — Telehealth: Payer: Self-pay

## 2022-07-15 VITALS — BP 194/91 | HR 67 | Ht 70.0 in | Wt 262.1 lb

## 2022-07-15 DIAGNOSIS — I10 Essential (primary) hypertension: Secondary | ICD-10-CM

## 2022-07-15 DIAGNOSIS — R1084 Generalized abdominal pain: Secondary | ICD-10-CM

## 2022-07-15 MED ORDER — SENNA-DOCUSATE SODIUM 8.6-50 MG PO TABS: 1.0000 | ORAL_TABLET | Freq: Every day | ORAL | 0 refills | Status: DC

## 2022-07-15 NOTE — Patient Instructions (Signed)
Thank you, [name] for allowing [place] to provide your care today.   I have ordered the following labs for you:   Lab Orders         CBC with Differential/Platelet         CMP14+EGFR         Lipase      Follow up in 2 weeks. If you feel better you can cancel. I sent a medication to help with bowel movements. I will call with results and prescribe additional treatment if needed.   Tamsen Snider, M.D.

## 2022-07-15 NOTE — Assessment & Plan Note (Signed)
BP Readings from Last 3 Encounters:  07/15/22 (!) 194/91  07/09/22 (!) 164/82  04/09/22 (!) 148/78   BP elevated today. Patient reports he has not taken his BP meds this morning. No changes to regimen today. Has follow up in 2 weeks.

## 2022-07-15 NOTE — Progress Notes (Signed)
   HPI:[name] is a 82 y.o. male who presents for evaluation of ER follow-up. Patient was seen for abdominal pain on 07/09/22 at [facility] ED. For the details of today's visit, please refer to the assessment and plan.  Physical Exam: Vitals:   07/15/22 0925  BP: (!) 194/91  Pulse: 67  SpO2: (!) 84%  Weight: 262 lb 1.9 oz (118.9 kg)  Height: 5' 10"$  (1.778 m)     Physical Exam Cardiovascular:     Rate and Rhythm: Normal rate and regular rhythm.  Abdominal:     Palpations: Abdomen is soft.     Tenderness: There is abdominal tenderness (generalized). There is no right CVA tenderness, left CVA tenderness or guarding.      Assessment & Plan:   [name] was seen today for follow-up.  Generalized abdominal pain Assessment & Plan: Patient underwent CT Abdomen and pelvis at ED. ED provider favored gastritis correlating clinical findings with  his read of CT. Formal read suggested no cause of acute abdominal pain. Reading also suggest chronic aspiration. Patient deferred referral to [facility] for swallow study. He has known history of swallowing difficulty since stroke. Recommended eating soft foods. Patient was started on pepcid and carafate. No improvement and has taken medication daily. He has not had a bowel movement in 2 weeks. No dilated bowel loops on imaging. Patient will start taking Miralax daily. In addition I have prescribed senna docusate. I favor his pain being secondary to constipation. Review of ED labs show mild elevation in lipase and WBC. Will repeat CBC ,lipase and CMP today.   Orders: -     CBC with Differential/Platelet -     CMP14+EGFR -     Lipase  Essential hypertension Overview: Qualifier: Diagnosis of  By: Lenn Cal    Assessment & Plan: BP Readings from Last 3 Encounters:  07/15/22 (!) 194/91  07/09/22 (!) 164/82  04/09/22 (!) 148/78   BP elevated today. Patient reports he has not taken his BP meds this morning. No changes to regimen today. Has follow  up in 2 weeks.    Other orders -     Senna-Docusate Sodium; Take 1 tablet by mouth daily.  Dispense: 14 tablet; Refill: 0      [name], MD

## 2022-07-15 NOTE — Telephone Encounter (Signed)
        Patient  visited [facility] on 2/21     Telephone encounter attempt :  1st  A HIPAA compliant voice message was left requesting a return call.  Instructed patient to call back    [place] [phone] [address], [place], [place] [address] Phone: [phone] Email: [email or web address]

## 2022-07-15 NOTE — Assessment & Plan Note (Signed)
Patient underwent CT Abdomen and pelvis at ED. ED provider favored gastritis correlating clinical findings with  his read of CT. Formal read suggested no cause of acute abdominal pain. Reading also suggest chronic aspiration. Patient deferred referral to [facility] for swallow study. He has known history of swallowing difficulty since stroke. Recommended eating soft foods. Patient was started on pepcid and carafate. No improvement and has taken medication daily. He has not had a bowel movement in 2 weeks. No dilated bowel loops on imaging. Patient will start taking Miralax daily. In addition I have prescribed senna docusate. I favor his pain being secondary to constipation. Review of ED labs show mild elevation in lipase and WBC. Will repeat CBC ,lipase and CMP today.

## 2022-07-16 ENCOUNTER — Telehealth: Payer: Self-pay

## 2022-07-16 LAB — CBC WITH DIFFERENTIAL/PLATELET
Basophils Absolute: 0.1 10*3/uL (ref 0.0–0.2)
Basos: 1 %
EOS (ABSOLUTE): 0.2 10*3/uL (ref 0.0–0.4)
Eos: 2 %
Hematocrit: 41.2 % (ref 37.5–51.0)
Hemoglobin: 13.2 g/dL (ref 13.0–17.7)
Immature Grans (Abs): 0 10*3/uL (ref 0.0–0.1)
Immature Granulocytes: 0 %
Lymphocytes Absolute: 2.7 10*3/uL (ref 0.7–3.1)
Lymphs: 27 %
MCH: 26.7 pg (ref 26.6–33.0)
MCHC: 32 g/dL (ref 31.5–35.7)
MCV: 83 fL (ref 79–97)
Monocytes Absolute: 0.7 10*3/uL (ref 0.1–0.9)
Monocytes: 7 %
Neutrophils Absolute: 6.1 10*3/uL (ref 1.4–7.0)
Neutrophils: 63 %
Platelets: 260 10*3/uL (ref 150–450)
RBC: 4.94 x10E6/uL (ref 4.14–5.80)
RDW: 14.2 % (ref 11.6–15.4)
WBC: 9.8 10*3/uL (ref 3.4–10.8)

## 2022-07-16 LAB — CMP14+EGFR
ALT: 8 IU/L (ref 0–44)
AST: 15 IU/L (ref 0–40)
Albumin/Globulin Ratio: 1.3 (ref 1.2–2.2)
Albumin: 4.2 g/dL (ref 3.8–4.8)
Alkaline Phosphatase: 82 IU/L (ref 44–121)
BUN/Creatinine Ratio: 15 (ref 10–24)
BUN: 20 mg/dL (ref 8–27)
Bilirubin Total: 0.3 mg/dL (ref 0.0–1.2)
CO2: 21 mmol/L (ref 20–29)
Calcium: 9.7 mg/dL (ref 8.6–10.2)
Chloride: 101 mmol/L (ref 96–106)
Creatinine, Ser: 1.33 mg/dL — ABNORMAL HIGH (ref 0.76–1.27)
Globulin, Total: 3.2 g/dL (ref 1.5–4.5)
Glucose: 76 mg/dL (ref 70–99)
Potassium: 4.8 mmol/L (ref 3.5–5.2)
Sodium: 140 mmol/L (ref 134–144)
Total Protein: 7.4 g/dL (ref 6.0–8.5)
eGFR: 54 mL/min/{1.73_m2} — ABNORMAL LOW (ref >59)

## 2022-07-16 LAB — LIPASE: Lipase: 22 U/L (ref 13–78)

## 2022-07-16 NOTE — Telephone Encounter (Signed)
     Patient  visit on 2/21  at [place] you been able to follow up with your primary care physician? Yes   The patient was or was not able to obtain any needed medicine or equipment. Yes    Are there diet recommendations that you are having difficulty following? Na   Patient expresses understanding of discharge instructions and education provided has no other needs at this time.  Yes      [place] [phone] [address], [place], [place] [address] Phone: [phone] Email: [email or web address]

## 2022-07-17 MED ORDER — SENNA-DOCUSATE SODIUM 8.6-50 MG PO TABS: 1.0000 | ORAL_TABLET | Freq: Every day | ORAL | 0 refills | Status: DC

## 2022-07-17 NOTE — Addendum Note (Signed)
Addended by: Lyndal Pulley on: 07/17/2022 12:20 PM   Modules accepted: Orders

## 2022-07-18 DIAGNOSIS — R231 Pallor: Secondary | ICD-10-CM | POA: Diagnosis not present

## 2022-07-18 DIAGNOSIS — R29810 Facial weakness: Secondary | ICD-10-CM | POA: Diagnosis not present

## 2022-07-18 DIAGNOSIS — R0902 Hypoxemia: Secondary | ICD-10-CM | POA: Diagnosis not present

## 2022-07-18 DIAGNOSIS — R531 Weakness: Secondary | ICD-10-CM | POA: Diagnosis not present

## 2022-07-19 ENCOUNTER — Emergency Department (HOSPITAL_COMMUNITY): Payer: Medicare HMO

## 2022-07-19 ENCOUNTER — Encounter (HOSPITAL_COMMUNITY): Payer: Self-pay

## 2022-07-19 ENCOUNTER — Emergency Department (HOSPITAL_COMMUNITY)
Admission: EM | Admit: 2022-07-19 | Discharge: 2022-07-19 | Disposition: A | Discharge status: Home / Self Care | Payer: Medicare HMO | Attending: Emergency Medicine | Admitting: Emergency Medicine

## 2022-07-19 ENCOUNTER — Other Ambulatory Visit: Payer: Self-pay

## 2022-07-19 DIAGNOSIS — I10 Essential (primary) hypertension: Secondary | ICD-10-CM | POA: Diagnosis not present

## 2022-07-19 DIAGNOSIS — Z794 Long term (current) use of insulin: Secondary | ICD-10-CM | POA: Insufficient documentation

## 2022-07-19 DIAGNOSIS — R4182 Altered mental status, unspecified: Secondary | ICD-10-CM | POA: Diagnosis not present

## 2022-07-19 DIAGNOSIS — M4312 Spondylolisthesis, cervical region: Secondary | ICD-10-CM | POA: Diagnosis not present

## 2022-07-19 DIAGNOSIS — R410 Disorientation, unspecified: Secondary | ICD-10-CM | POA: Insufficient documentation

## 2022-07-19 DIAGNOSIS — I6381 Other cerebral infarction due to occlusion or stenosis of small artery: Secondary | ICD-10-CM | POA: Diagnosis not present

## 2022-07-19 DIAGNOSIS — R531 Weakness: Secondary | ICD-10-CM | POA: Diagnosis not present

## 2022-07-19 DIAGNOSIS — I6782 Cerebral ischemia: Secondary | ICD-10-CM | POA: Diagnosis not present

## 2022-07-19 DIAGNOSIS — Z7982 Long term (current) use of aspirin: Secondary | ICD-10-CM | POA: Insufficient documentation

## 2022-07-19 DIAGNOSIS — I700 Atherosclerosis of aorta: Secondary | ICD-10-CM | POA: Diagnosis not present

## 2022-07-19 DIAGNOSIS — Z7902 Long term (current) use of antithrombotics/antiplatelets: Secondary | ICD-10-CM | POA: Insufficient documentation

## 2022-07-19 DIAGNOSIS — R109 Unspecified abdominal pain: Secondary | ICD-10-CM | POA: Insufficient documentation

## 2022-07-19 DIAGNOSIS — R600 Localized edema: Secondary | ICD-10-CM | POA: Insufficient documentation

## 2022-07-19 DIAGNOSIS — I4510 Unspecified right bundle-branch block: Secondary | ICD-10-CM | POA: Diagnosis not present

## 2022-07-19 DIAGNOSIS — E162 Hypoglycemia, unspecified: Secondary | ICD-10-CM | POA: Diagnosis not present

## 2022-07-19 DIAGNOSIS — M47812 Spondylosis without myelopathy or radiculopathy, cervical region: Secondary | ICD-10-CM | POA: Insufficient documentation

## 2022-07-19 DIAGNOSIS — Z79899 Other long term (current) drug therapy: Secondary | ICD-10-CM | POA: Insufficient documentation

## 2022-07-19 DIAGNOSIS — N390 Urinary tract infection, site not specified: Secondary | ICD-10-CM | POA: Insufficient documentation

## 2022-07-19 DIAGNOSIS — J479 Bronchiectasis, uncomplicated: Secondary | ICD-10-CM | POA: Diagnosis not present

## 2022-07-19 DIAGNOSIS — E11649 Type 2 diabetes mellitus with hypoglycemia without coma: Secondary | ICD-10-CM | POA: Diagnosis not present

## 2022-07-19 LAB — COMPREHENSIVE METABOLIC PANEL
ALT: 11 U/L (ref 0–44)
AST: 15 U/L (ref 15–41)
Albumin: 3.4 g/dL — ABNORMAL LOW (ref 3.5–5.0)
Alkaline Phosphatase: 58 U/L (ref 38–126)
Anion gap: 8 (ref 5–15)
BUN: 22 mg/dL (ref 8–23)
CO2: 25 mmol/L (ref 22–32)
Calcium: 8.4 mg/dL — ABNORMAL LOW (ref 8.9–10.3)
Chloride: 100 mmol/L (ref 98–111)
Creatinine, Ser: 1.4 mg/dL — ABNORMAL HIGH (ref 0.61–1.24)
GFR, Estimated: 51 mL/min — ABNORMAL LOW (ref >60)
Glucose, Bld: 290 mg/dL — ABNORMAL HIGH (ref 70–99)
Potassium: 3.7 mmol/L (ref 3.5–5.1)
Sodium: 133 mmol/L — ABNORMAL LOW (ref 135–145)
Total Bilirubin: 0.6 mg/dL (ref 0.3–1.2)
Total Protein: 7 g/dL (ref 6.5–8.1)

## 2022-07-19 LAB — RAPID URINE DRUG SCREEN, HOSP PERFORMED
Amphetamines: NOT DETECTED
Barbiturates: NOT DETECTED
Benzodiazepines: NOT DETECTED
Cocaine: NOT DETECTED
Opiates: NOT DETECTED
Tetrahydrocannabinol: NOT DETECTED

## 2022-07-19 LAB — CBC
HCT: 37.1 % — ABNORMAL LOW (ref 39.0–52.0)
Hemoglobin: 11.6 g/dL — ABNORMAL LOW (ref 13.0–17.0)
MCH: 27.2 pg (ref 26.0–34.0)
MCHC: 31.3 g/dL (ref 30.0–36.0)
MCV: 87.1 fL (ref 80.0–100.0)
Platelets: 246 10*3/uL (ref 150–400)
RBC: 4.26 MIL/uL (ref 4.22–5.81)
RDW: 15.6 % — ABNORMAL HIGH (ref 11.5–15.5)
WBC: 10.5 10*3/uL (ref 4.0–10.5)
nRBC: 0 % (ref 0.0–0.2)

## 2022-07-19 LAB — PROTIME-INR
INR: 1 (ref 0.8–1.2)
Prothrombin Time: 13.4 seconds (ref 11.4–15.2)

## 2022-07-19 LAB — URINALYSIS, ROUTINE W REFLEX MICROSCOPIC
Bilirubin Urine: NEGATIVE
Glucose, UA: NEGATIVE mg/dL
Ketones, ur: NEGATIVE mg/dL
Nitrite: POSITIVE — AB
Protein, ur: 30 mg/dL — AB
Specific Gravity, Urine: 1.014 (ref 1.005–1.030)
pH: 5 (ref 5.0–8.0)

## 2022-07-19 LAB — DIFFERENTIAL
Abs Immature Granulocytes: 0.03 10*3/uL (ref 0.00–0.07)
Basophils Absolute: 0.1 10*3/uL (ref 0.0–0.1)
Basophils Relative: 1 %
Eosinophils Absolute: 0.2 10*3/uL (ref 0.0–0.5)
Eosinophils Relative: 2 %
Immature Granulocytes: 0 %
Lymphocytes Relative: 29 %
Lymphs Abs: 3.1 10*3/uL (ref 0.7–4.0)
Monocytes Absolute: 0.8 10*3/uL (ref 0.1–1.0)
Monocytes Relative: 8 %
Neutro Abs: 6.3 10*3/uL (ref 1.7–7.7)
Neutrophils Relative %: 60 %

## 2022-07-19 LAB — CBG MONITORING, ED
Glucose-Capillary: 116 mg/dL — ABNORMAL HIGH (ref 70–99)
Glucose-Capillary: 124 mg/dL — ABNORMAL HIGH (ref 70–99)
Glucose-Capillary: 162 mg/dL — ABNORMAL HIGH (ref 70–99)
Glucose-Capillary: 65 mg/dL — ABNORMAL LOW (ref 70–99)
Glucose-Capillary: 98 mg/dL (ref 70–99)

## 2022-07-19 LAB — I-STAT CHEM 8, ED
BUN: 29 mg/dL — ABNORMAL HIGH (ref 8–23)
Calcium, Ion: 1.12 mmol/L — ABNORMAL LOW (ref 1.15–1.40)
Chloride: 101 mmol/L (ref 98–111)
Creatinine, Ser: 1.5 mg/dL — ABNORMAL HIGH (ref 0.61–1.24)
Glucose, Bld: 263 mg/dL — ABNORMAL HIGH (ref 70–99)
HCT: 36 % — ABNORMAL LOW (ref 39.0–52.0)
Hemoglobin: 12.2 g/dL — ABNORMAL LOW (ref 13.0–17.0)
Potassium: 4.7 mmol/L (ref 3.5–5.1)
Sodium: 135 mmol/L (ref 135–145)
TCO2: 27 mmol/L (ref 22–32)

## 2022-07-19 LAB — APTT: aPTT: 31 seconds (ref 24–36)

## 2022-07-19 LAB — ETHANOL: Alcohol, Ethyl (B): <10 mg/dL (ref <10)

## 2022-07-19 MED ORDER — SODIUM CHLORIDE 0.9 % IV SOLN
2.0000 g | Freq: Once | INTRAVENOUS | Status: AC
  Administered 2022-07-19: 2 g via INTRAVENOUS
  Filled 2022-07-19: qty 20

## 2022-07-19 MED ORDER — DEXTROSE 50 % IV SOLN
50.0000 mL | Freq: Once | INTRAVENOUS | Status: AC
  Administered 2022-07-19: 50 mL via INTRAVENOUS

## 2022-07-19 MED ORDER — IOHEXOL 300 MG/ML  SOLN
100.0000 mL | Freq: Once | INTRAMUSCULAR | Status: AC | PRN
  Administered 2022-07-19: 100 mL via INTRAVENOUS

## 2022-07-19 MED ORDER — CEPHALEXIN 500 MG PO CAPS: 500.0000 mg | ORAL_CAPSULE | Freq: Three times a day (TID) | ORAL | 0 refills | Status: DC

## 2022-07-19 MED ORDER — METOPROLOL TARTRATE 25 MG PO TABS: 25.0000 mg | ORAL_TABLET | ORAL | Status: DC

## 2022-07-19 NOTE — ED Notes (Signed)
Pt was given cheese it's and oreos and juice pt's blood sugar low per md orders. [name]

## 2022-07-19 NOTE — ED Notes (Signed)
On arrival checked pt sugar was 44. Had to override due to no stickers. MD notified

## 2022-07-19 NOTE — ED Provider Notes (Signed)
[facility] Provider Note   CSN: [id number] Arrival date & time: 07/19/22  0012     History  Chief Complaint  Patient presents with   Altered Mental Status    [name] is a 82 y.o. male.  Patient brought to the emergency department by EMS from home.  Patient exhibiting altered mental status.  Wife reports that she was talking with the patient and he suddenly became confused.  She thought it might be his blood sugar.  Patient comes by EMS.  They encountered the patient on the floor, having slid out of his recliner.  EMS concerned about right-sided facial droop, right-sided weakness.       Home Medications Prior to Admission medications   Medication Sig Start Date End Date Taking? Authorizing Provider  cephALEXin (KEFLEX) 500 MG capsule Take 1 capsule (500 mg total) by mouth 3 (three) times daily. 07/19/22  Yes Kayshawn Ozburn, Gwenyth Allegra, MD  acetaminophen (TYLENOL) 500 MG tablet Take 1,000 mg by mouth every 6 (six) hours as needed for moderate pain (left thigh).     [provider]  albuterol (VENTOLIN HFA) 108 (90 Base) MCG/ACT inhaler INHALE 1 TO 2 PUFFS EVERY 6 HOURS AS NEEDED FOR WHEEZING OR SHORTNESS OF BREATH 03/05/22   Fayrene Helper, MD  allopurinol (ZYLOPRIM) 300 MG tablet TAKE 1 TABLET EVERY DAY 08/15/21   Fayrene Helper, MD  aspirin EC 81 MG tablet Take 81 mg by mouth daily.    [provider]  Blood Glucose Calibration (TRUE METRIX LEVEL 1) Low SOLN Use as directed 03/09/20   Fayrene Helper, MD  Blood Glucose Monitoring Suppl (TRUE METRIX METER) w/Device KIT USE AS DIRECTED 09/18/20   Fayrene Helper, MD  budesonide-formoterol [facility]) 80-4.5 MCG/ACT inhaler Inhale 2 puffs into the lungs daily. 07/07/19   Perlie Mayo, NP  clopidogrel (PLAVIX) 75 MG tablet TAKE 1 TABLET EVERY DAY 06/05/22   Fayrene Helper, MD  DROPLET INSULIN SYRINGE 31G X 5/16" 1 ML MISC USE AS DIRECTED IN THE  MORNING AND AT BEDTIME 06/11/20   Fayrene Helper, MD  ezetimibe (ZETIA) 10 MG tablet TAKE 1 TABLET EVERY DAY 03/05/22   Fayrene Helper, MD  famotidine (PEPCID) 20 MG tablet Take 1 tablet (20 mg total) by mouth 2 (two) times daily. 07/09/22   Mesner, Corene Cornea, MD  glucose blood (TRUE METRIX BLOOD GLUCOSE TEST) test strip 4 times daily testing dx e11.65 01/14/21   Fayrene Helper, MD  isosorbide mononitrate (IMDUR) 30 MG 24 hr tablet TAKE 1 TABLET EVERY DAY 11/21/21   Fayrene Helper, MD  levETIRAcetam (KEPPRA) 500 MG tablet TAKE 1 TABLET TWICE DAILY 03/05/22   Fayrene Helper, MD  levothyroxine (SYNTHROID) 112 MCG tablet TAKE 1 TABLET EVERY DAY 05/23/22   Brita Romp, NP  methocarbamol (ROBAXIN) 500 MG tablet Take 1 tablet (500 mg total) by mouth 2 (two) times daily. 04/09/22   Gowens, Mariah L, PA-C  metoprolol tartrate (LOPRESSOR) 50 MG tablet TAKE 1/2 TABLET TWICE DAILY 08/15/21   Fayrene Helper, MD  nirmatrelvir & ritonavir (PAXLOVID, 300/100,) 20 x 150 MG & 10 x '100MG'$  TBPK Take 3 tablets by mouth 2 (two) times daily. 04/03/22   Milton Ferguson, MD  nitroGLYCERIN (NITROSTAT) 0.4 MG SL tablet Place 1 tablet (0.4 mg total) under the tongue every 5 (five) minutes as needed for chest pain. 05/08/17 09/02/19  Charolette Forward, MD  NOVOLIN 70/30 (70-30) 100  UNIT/ML injection INJECT 55 UNITS UNDER THE SKIN WITH BREAKFAST AND 45 UNITS WITH SUPPER. 05/20/22   Brita Romp, NP  pantoprazole (PROTONIX) 40 MG tablet TAKE 1 TABLET EVERY DAY BEFORE BREAKFAST 03/05/22   Fayrene Helper, MD  polyethylene glycol (MIRALAX / GLYCOLAX) 17 g packet Take 17 g by mouth daily as needed. 09/04/19   [place] Donnamarie Poag, DO  pravastatin (PRAVACHOL) 40 MG tablet TAKE 2 TABLETS EVERY DAY 03/05/22   Fayrene Helper, MD  sennosides-docusate sodium (SENOKOT-S) 8.6-50 MG tablet Take 1 tablet by mouth daily. 07/17/22   Lyndal Pulley, MD  spironolactone (ALDACTONE) 50 MG tablet TAKE 1 TABLET EVERY DAY  (DOSE INCREASE) 05/20/22   Fayrene Helper, MD  sucralfate (CARAFATE) 1 GM/10ML suspension Take 10 mLs (1 g total) by mouth 4 (four) times daily -  with meals and at bedtime. 07/09/22   Mesner, Corene Cornea, MD  TRUEplus Lancets 33G MISC TEST BLOOD SUGAR FOUR TIMES DAILY 01/06/22   Fayrene Helper, MD  UNABLE TO FIND CPAP mask and tubing  DX G47.33 06/11/21   Fayrene Helper, MD  vitamin B-12 (CYANOCOBALAMIN) 1000 MCG tablet Take 1,000 mcg by mouth daily.    [provider]  Vitamin D, Ergocalciferol, (DRISDOL) 1.25 MG (50000 UNIT) CAPS capsule Take by mouth. 05/05/21   [provider]      Allergies    Patient has no known allergies.    Review of Systems   Review of Systems  Physical Exam Updated Vital Signs BP (!) 171/90   Pulse 75   Temp 98.4 F (36.9 C) (Oral)   Resp (!) 21   Ht '5\' 10"'$  (1.778 m)   Wt 119 kg   SpO2 93%   BMI 37.64 kg/m  Physical Exam Constitutional:      Appearance: He is overweight.  HENT:     Head: Atraumatic.  Eyes:     Pupils: Pupils are equal, round, and reactive to light.  Cardiovascular:     Rate and Rhythm: Normal rate and regular rhythm.  Pulmonary:     Effort: Pulmonary effort is normal.     Breath sounds: Normal breath sounds.  Abdominal:     Palpations: Abdomen is soft.  Musculoskeletal:     Cervical back: Neck supple.     Right lower leg: Edema present.     Left lower leg: Edema present.  Neurological:     Mental Status: He is disoriented and confused.     GCS: GCS eye subscore is 4. GCS verbal subscore is 4. GCS motor subscore is 6.     Motor: Weakness (Right-sided extremities) present.     ED Results / Procedures / Treatments   Labs (all labs ordered are listed, but only abnormal results are displayed) Labs Reviewed  CBC - Abnormal; Notable for the following components:      Result Value   Hemoglobin 11.6 (*)    HCT 37.1 (*)    RDW 15.6 (*)    All other components within normal limits  COMPREHENSIVE  METABOLIC PANEL - Abnormal; Notable for the following components:   Sodium 133 (*)    Glucose, Bld 290 (*)    Creatinine, Ser 1.40 (*)    Calcium 8.4 (*)    Albumin 3.4 (*)    GFR, Estimated 51 (*)    All other components within normal limits  URINALYSIS, ROUTINE W REFLEX MICROSCOPIC - Abnormal; Notable for the following components:   APPearance HAZY (*)  Hgb urine dipstick SMALL (*)    Protein, ur 30 (*)    Nitrite POSITIVE (*)    Leukocytes,Ua MODERATE (*)    Bacteria, UA MANY (*)    All other components within normal limits  I-STAT CHEM 8, ED - Abnormal; Notable for the following components:   BUN 29 (*)    Creatinine, Ser 1.50 (*)    Glucose, Bld 263 (*)    Calcium, Ion 1.12 (*)    Hemoglobin 12.2 (*)    HCT 36.0 (*)    All other components within normal limits  CBG MONITORING, ED - Abnormal; Notable for the following components:   Glucose-Capillary 162 (*)    All other components within normal limits  CBG MONITORING, ED - Abnormal; Notable for the following components:   Glucose-Capillary 65 (*)    All other components within normal limits  CBG MONITORING, ED - Abnormal; Notable for the following components:   Glucose-Capillary 124 (*)    All other components within normal limits  CBG MONITORING, ED - Abnormal; Notable for the following components:   Glucose-Capillary 116 (*)    All other components within normal limits  ETHANOL  DIFFERENTIAL  RAPID URINE DRUG SCREEN, HOSP PERFORMED  PROTIME-INR  APTT  CBG MONITORING, ED    EKG None  Radiology CT ABDOMEN PELVIS W CONTRAST  Result Date: 07/19/2022 CLINICAL DATA:  Abdominal pain.  Fall. EXAM: CT ABDOMEN AND PELVIS WITH CONTRAST TECHNIQUE: Multidetector CT imaging of the abdomen and pelvis was performed using the standard protocol following bolus administration of intravenous contrast. RADIATION DOSE REDUCTION: This exam was performed according to the departmental dose-optimization program which includes automated  exposure control, adjustment of the mA and/or kV according to patient size and/or use of iterative reconstruction technique. CONTRAST:  163m OMNIPAQUE IOHEXOL 300 MG/ML  SOLN COMPARISON:  07/09/2022 FINDINGS: Lower chest: Coronary artery calcification is evident. Mild bronchiectasis again noted both lower lobes with volume loss and bandlike/patchy consolidative opacity in the dependent costophrenic sulci of each lung. Hepatobiliary: No suspicious focal abnormality within the liver parenchyma. There is no evidence for gallstones, gallbladder wall thickening, or pericholecystic fluid. No intrahepatic or extrahepatic biliary dilation. Pancreas: No focal mass lesion. No dilatation of the main duct. No intraparenchymal cyst. No peripancreatic edema. Spleen: No splenomegaly. No focal mass lesion. Adrenals/Urinary Tract: No adrenal nodule or mass. Stable 5.6 cm Bosniak I cysts right kidney. No followup imaging is recommended. Left kidney unremarkable. No evidence for hydroureter. The urinary bladder appears normal for the degree of distention. Stomach/Bowel: Stomach is unremarkable. No gastric wall thickening. No evidence of outlet obstruction. Duodenum is normally positioned as is the ligament of Treitz. No small bowel wall thickening. No small bowel dilatation. The terminal ileum is normal. Nonvisualization of the appendix is consistent with the reported history of appendectomy. No gross colonic mass. No colonic wall thickening. Vascular/Lymphatic: There is moderate atherosclerotic calcification of the abdominal aorta without aneurysm. There is no gastrohepatic or hepatoduodenal ligament lymphadenopathy. No retroperitoneal or mesenteric lymphadenopathy. Stable upper normal lymph nodes in the hepatoduodenal ligament. No pelvic sidewall lymphadenopathy. Reproductive: The prostate gland and seminal vesicles are unremarkable. Other: No intraperitoneal free fluid. Musculoskeletal: No worrisome lytic or sclerotic osseous  abnormality. IMPRESSION: 1. No acute findings in the abdomen or pelvis. 2. Mild bronchiectasis again noted both lower lobes with volume loss and bandlike/patchy consolidative opacity in the dependent costophrenic sulci of each lung. No substantial change. Imaging features are compatible with chronic atelectasis or scarring. Recurrent aspiration could  have this appearance. 3.  Aortic Atherosclerosis (ICD10-I70.0). Electronically Signed   By: Misty Stanley M.D.   On: 07/19/2022 05:57   CT CERVICAL SPINE WO CONTRAST  Result Date: 07/19/2022 CLINICAL DATA:  Mental status change, unknown cause. Found on floor. EXAM: CT HEAD WITHOUT CONTRAST CT CERVICAL SPINE WITHOUT CONTRAST TECHNIQUE: Multidetector CT imaging of the head and cervical spine was performed following the standard protocol without intravenous contrast. Multiplanar CT image reconstructions of the cervical spine were also generated. RADIATION DOSE REDUCTION: This exam was performed according to the departmental dose-optimization program which includes automated exposure control, adjustment of the mA and/or kV according to patient size and/or use of iterative reconstruction technique. COMPARISON:  07/26/2016, 04/20/2015. FINDINGS: CT HEAD FINDINGS Brain: No acute intracranial hemorrhage, midline shift or mass effect. No extra-axial fluid collection. Mild diffuse atrophy is noted. Subcortical and periventricular white matter hypodensities are present bilaterally. No hydrocephalus. Old lacunar infarcts are noted in the basal ganglia bilaterally. Vascular: No hyperdense vessel or unexpected calcification. Skull: Normal. Negative for fracture or focal lesion. Sinuses/Orbits: No acute finding. Other: None. CT CERVICAL SPINE FINDINGS Alignment: There is mild anterolisthesis at C2-C3 and C7-T1. There is mild kyphosis of the upper cervical spine. Skull base and vertebrae: No acute fracture. No primary bone lesion or focal pathologic process. Soft tissues and spinal  canal: No prevertebral fluid or swelling. No visible canal hematoma. Disc levels: Multilevel intervertebral disc space narrowing, uncovertebral osteophyte formation, and facet arthropathy. Upper chest: No acute abnormality. Other: Carotid artery calcifications. IMPRESSION: 1. No acute intracranial process. 2. Atrophy with chronic microvascular ischemic changes and old lacunar infarcts. 3. Multilevel degenerative changes in the cervical spine without evidence of acute fracture. Electronically Signed   By: Brett Fairy M.D.   On: 07/19/2022 01:05   CT HEAD WO CONTRAST  Result Date: 07/19/2022 CLINICAL DATA:  Mental status change, unknown cause. Found on floor. EXAM: CT HEAD WITHOUT CONTRAST CT CERVICAL SPINE WITHOUT CONTRAST TECHNIQUE: Multidetector CT imaging of the head and cervical spine was performed following the standard protocol without intravenous contrast. Multiplanar CT image reconstructions of the cervical spine were also generated. RADIATION DOSE REDUCTION: This exam was performed according to the departmental dose-optimization program which includes automated exposure control, adjustment of the mA and/or kV according to patient size and/or use of iterative reconstruction technique. COMPARISON:  07/26/2016, 04/20/2015. FINDINGS: CT HEAD FINDINGS Brain: No acute intracranial hemorrhage, midline shift or mass effect. No extra-axial fluid collection. Mild diffuse atrophy is noted. Subcortical and periventricular white matter hypodensities are present bilaterally. No hydrocephalus. Old lacunar infarcts are noted in the basal ganglia bilaterally. Vascular: No hyperdense vessel or unexpected calcification. Skull: Normal. Negative for fracture or focal lesion. Sinuses/Orbits: No acute finding. Other: None. CT CERVICAL SPINE FINDINGS Alignment: There is mild anterolisthesis at C2-C3 and C7-T1. There is mild kyphosis of the upper cervical spine. Skull base and vertebrae: No acute fracture. No primary bone  lesion or focal pathologic process. Soft tissues and spinal canal: No prevertebral fluid or swelling. No visible canal hematoma. Disc levels: Multilevel intervertebral disc space narrowing, uncovertebral osteophyte formation, and facet arthropathy. Upper chest: No acute abnormality. Other: Carotid artery calcifications. IMPRESSION: 1. No acute intracranial process. 2. Atrophy with chronic microvascular ischemic changes and old lacunar infarcts. 3. Multilevel degenerative changes in the cervical spine without evidence of acute fracture. Electronically Signed   By: Brett Fairy M.D.   On: 07/19/2022 01:05    Procedures Procedures    Medications Ordered in ED Medications  dextrose 50 % solution 50 mL (50 mLs Intravenous Given 07/19/22 0017)  cefTRIAXone (ROCEPHIN) 2 g in sodium chloride 0.9 % 100 mL IVPB (0 g Intravenous Stopped 07/19/22 0527)  iohexol (OMNIPAQUE) 300 MG/ML solution 100 mL (100 mLs Intravenous Contrast Given 07/19/22 0528)    ED Course/ Medical Decision Making/ A&P                             Medical Decision Making Amount and/or Complexity of Data Reviewed Independent Historian: spouse and EMS Labs: ordered. Decision-making details documented in ED Course. Radiology: ordered and independent interpretation performed. Decision-making details documented in ED Course. ECG/medicine tests: ordered and independent interpretation performed. Decision-making details documented in ED Course.  Risk Prescription drug management.   Differential diagnosis considered includes, but not limited to:  Acute stroke, metabolic encephalopathy, hypoglycemia  Patient noted to be disoriented at arrival.  He does have a right-sided deficit.  Wife, however, reports that he has a pre-existing right hemiparesis secondary to stroke.  He generally cannot move his right arm or leg very much because of this pre-existing deficit.    Patient's blood sugar was found to be 44 at arrival.  He was given IV  dextrose and became immediately more alert.  He does not have any facial asymmetry.  He is now oriented x 3.  Wife confirms that he is back to his baseline, no new stroke symptoms or deficit.  Sugar did drop somewhat after D50.  He was fed, however, and blood sugar has been maintaining.  Now that he is more awake and alert he indicates that he is having pain on the right side of his abdomen.  Urinalysis is assistant with infection.  Treated with Rocephin.  CT abdomen and pelvis performed, no acute abnormality noted.  Patient to be discharged on Keflex.  Watch sugars closely.         Final Clinical Impression(s) / ED Diagnoses Final diagnoses:  Hypoglycemia  Urinary tract infection without hematuria, site unspecified    Rx / DC Orders ED Discharge Orders          Ordered    cephALEXin (KEFLEX) 500 MG capsule  3 times daily        07/19/22 0607              Orpah Greek, MD 07/19/22 [phone]

## 2022-07-19 NOTE — ED Triage Notes (Addendum)
Pt arrived from home via POV s/p sliding out of recliner. Pt was found on floor by ems.  In triage bs 44. Pt is alert and oriented x 4 at present.

## 2022-07-19 NOTE — ED Notes (Signed)
Pt has been cleaned, new brief and malewick in place. Pt in CT at this time

## 2022-07-21 ENCOUNTER — Telehealth: Payer: Self-pay | Admitting: *Deleted

## 2022-07-21 ENCOUNTER — Encounter: Payer: Self-pay | Admitting: *Deleted

## 2022-07-21 LAB — CBG MONITORING, ED: Glucose-Capillary: 44 mg/dL — CL (ref 70–99)

## 2022-07-21 NOTE — Patient Outreach (Deleted)
{  CARE COORDINATION NOTES:[id number]} 

## 2022-07-21 NOTE — Transitions of Care (Post Inpatient/ED Visit) (Signed)
07/21/2022  Name: [name] MRN: [id number] DOB: 1940-08-08  Today's TOC FU Call Status: Today's TOC FU Call Status:: Successful TOC FU Call Competed TOC FU Call Complete Date: 07/21/22  Transition Care Management Follow-up Telephone Call Date of Discharge: 07/19/22 Discharge [facility] Type of Discharge: Emergency Department (ED EMMI Flag) Reason for ED Visit: Endocrine, Other: (abdominal pain) Endocrine Diagnosis:  (hypoglycemia) How have you been since you were released from the hospital?: Better Any questions or concerns?: No  Items Reviewed: Did you receive and understand the discharge instructions provided?: Yes (EMMI call indicated that he did not, but patient reports that he did) Medications obtained and verified?: Yes (Medications Reviewed) (Received keflex and carafate. did not receive senokot-S. Advised this is OTC and ins likely denied coverage. Can purchase OTC.) Any new allergies since your discharge?: No Dietary orders reviewed?: NA Do you have support at home?: Yes People in Home: spouse Name of Support/Comfort Primary Source: [facility] and Equipment/Supplies: [place] Ordered?: No Any new equipment or medical supplies ordered?: No  Functional Questionnaire: Do you need assistance with bathing/showering or dressing?: No Do you need assistance with meal preparation?: No Do you need assistance with eating?: No Do you have difficulty maintaining continence: No Do you need assistance with getting out of bed/getting out of a chair/moving?: No Do you have difficulty managing or taking your medications?: No  Folllow up appointments reviewed: PCP Follow-up appointment confirmed?: Yes Date of PCP follow-up appointment?: 07/29/22 Follow-up Provider: Dr [facility] Follow-up appointment confirmed?: Yes Date of Specialist follow-up appointment?: 07/30/22 Follow-Up Specialty Provider:: Rayetta Pigg, NP  (endo) Do you need transportation to your follow-up appointment?: No Do you understand care options if your condition(s) worsen?: Yes-patient verbalized understanding  SDOH Interventions Today    Flowsheet Row Most Recent Value  SDOH Interventions   Transportation Interventions Intervention Not Indicated  Financial Strain Interventions Intervention Not Indicated      Interventions Today    Flowsheet Row Most Recent Value  Chronic Disease   Chronic disease during today's visit Diabetes  General Interventions   General Interventions Discussed/Reviewed Doctor Visits  [checking blood sugar fasting and prior to insulin admin before supper. advised to check prior to bedtime as well since he's had low readings in the morning. Take log to endo visit. Call Endo with readings below 70.]  Doctor Visits Discussed/Reviewed Doctor Visits Discussed, Doctor Visits Reviewed, PCP, Specialist  PCP/Specialist Visits Compliance with follow-up visit  Education Interventions   Education Provided Provided Education  Provided Verbal Education On Nutrition, Blood Sugar Monitoring, Medication, Sick Day Rules, When to see the doctor  Pharmacy Interventions   Pharmacy Dicussed/Reviewed Medications and their functions  [Novolin 7/30  INJECT 55 UNITS UNDER THE SKIN WITH BREAKFAST AND 45 UNITS WITH SUPPER. Do not use if CBG <90. Can buy senokot-s OTC since pharm didn't fill it.]      TOC Interventions Today    Flowsheet Row Most Recent Value  TOC Interventions   TOC Interventions Discussed/Reviewed Contacted provider for patient needs, TOC Interventions Discussed  [Staff Message to Rayetta Pigg, NP (Endo) re: episodes of hypoglycemia. Advised to seek medical attention for new or worsening symptoms. Urgent Care on [address] is a good alternative to ED for non-emergencies if he can't get in to see PCP]       Chong Sicilian, BSN, RN-BC RN Care Coordinator [place]:  [phone] Main #: [phone]

## 2022-07-25 DIAGNOSIS — E038 Other specified hypothyroidism: Secondary | ICD-10-CM | POA: Diagnosis not present

## 2022-07-25 DIAGNOSIS — E1165 Type 2 diabetes mellitus with hyperglycemia: Secondary | ICD-10-CM | POA: Diagnosis not present

## 2022-07-26 LAB — TSH: TSH: 20.8 u[IU]/mL — ABNORMAL HIGH (ref 0.450–4.500)

## 2022-07-26 LAB — COMPREHENSIVE METABOLIC PANEL
ALT: 10 IU/L (ref 0–44)
AST: 13 IU/L (ref 0–40)
Albumin/Globulin Ratio: 1.2 (ref 1.2–2.2)
Albumin: 3.7 g/dL — ABNORMAL LOW (ref 3.8–4.8)
Alkaline Phosphatase: 80 IU/L (ref 44–121)
BUN/Creatinine Ratio: 9 — ABNORMAL LOW (ref 10–24)
BUN: 12 mg/dL (ref 8–27)
Bilirubin Total: 0.4 mg/dL (ref 0.0–1.2)
CO2: 21 mmol/L (ref 20–29)
Calcium: 9.1 mg/dL (ref 8.6–10.2)
Chloride: 102 mmol/L (ref 96–106)
Creatinine, Ser: 1.27 mg/dL (ref 0.76–1.27)
Globulin, Total: 3 g/dL (ref 1.5–4.5)
Glucose: 88 mg/dL (ref 70–99)
Potassium: 4.3 mmol/L (ref 3.5–5.2)
Sodium: 137 mmol/L (ref 134–144)
Total Protein: 6.7 g/dL (ref 6.0–8.5)
eGFR: 57 mL/min/{1.73_m2} — ABNORMAL LOW (ref >59)

## 2022-07-26 LAB — T4, FREE: Free T4: 0.72 ng/dL — ABNORMAL LOW (ref 0.82–1.77)

## 2022-07-29 ENCOUNTER — Ambulatory Visit (INDEPENDENT_AMBULATORY_CARE_PROVIDER_SITE_OTHER): Payer: Medicare HMO | Admitting: Internal Medicine

## 2022-07-29 ENCOUNTER — Encounter: Payer: Self-pay | Admitting: Internal Medicine

## 2022-07-29 VITALS — BP 171/67 | HR 79 | Resp 16 | Ht 70.0 in | Wt 236.0 lb

## 2022-07-29 DIAGNOSIS — I10 Essential (primary) hypertension: Secondary | ICD-10-CM | POA: Diagnosis not present

## 2022-07-29 DIAGNOSIS — Z794 Long term (current) use of insulin: Secondary | ICD-10-CM | POA: Diagnosis not present

## 2022-07-29 DIAGNOSIS — E11649 Type 2 diabetes mellitus with hypoglycemia without coma: Secondary | ICD-10-CM | POA: Diagnosis not present

## 2022-07-29 DIAGNOSIS — G4733 Obstructive sleep apnea (adult) (pediatric): Secondary | ICD-10-CM | POA: Diagnosis not present

## 2022-07-29 NOTE — Patient Instructions (Addendum)
Thank you, [name] for allowing [place] to provide your care today.    I am glad your abdominal pain is proved.  I have placed an order for a sleep study so we can get you a new CPAP machine.  Please discuss your insulin regimen with endocrinology tomorrow.  You already have follow-up scheduled with Dr. Moshe Cipro.    Tamsen Snider, M.D.

## 2022-07-29 NOTE — Progress Notes (Unsigned)
   HPI:[name] is a 82 y.o. male with pertinent history of T2DM , HTN, who presents for follow-up from emergency department after he presented with altered mental status and was found to have a low blood sugar and leukocyturia.  His low blood sugar was treated with D50.  He was also given Rocephin and discharged on Keflex.Today his abdominal pain has resolved. He has not taken blood pressure medication this morning. For the details of today's visit, please refer to the assessment and plan.  Physical Exam: Vitals:   07/29/22 0952  BP: (!) 171/67  Pulse: 79  Resp: 16  SpO2: (!) 86%  Weight: 236 lb (107 kg)  Height: 5\' 10"  (1.778 m)     Physical Exam Constitutional:      General: He is not in acute distress.    Appearance: He is not ill-appearing.     Comments: Wearing 2L supplemental oxygen  Cardiovascular:     Rate and Rhythm: Normal rate and regular rhythm.  Pulmonary:     Breath sounds: No wheezing or rales.  Abdominal:     General: Bowel sounds are normal.     Palpations: Abdomen is soft.     Tenderness: There is no abdominal tenderness. There is no guarding.      Assessment & Plan:   [name] was seen today for er follow up .  OSA on CPAP Assessment & Plan: Chronic problem Patient reports his CPAP is over 4 years old. Last sleep study in 2017 Sleep study    Orders: -     Split night study -     For home use only DME continuous positive airway pressure (CPAP)  Type 2 diabetes mellitus with hypoglycemia without coma, with long-term current use of insulin ([name]) Assessment & Plan: Patient has not had any hypoglycemic episodes since ER visit.  Hypoglycemia in the setting of recent abdominal pain.  After discussion I suspect he was not eating as much and taking the same amount of insulin.  His abdominal pain has improved since being treated with antibiotics and his bowel movements are regular.  He is on insulin 70/30 45 units in a.m. and 55 units in the p.m.   He has follow-up with endocrinology tomorrow.  He is following them for long time and he is asymptomatic today so I will defer any changes.   Essential hypertension Overview: Qualifier: Diagnosis of  By: Lenn Cal    Assessment & Plan: Patient's blood pressure 171/67 today.  Again he did not take his medications this morning and encouraged to take blood pressure medications before his visits.  -Continue metoprolol, Imdur, and spironolactone       [name], MD

## 2022-07-30 ENCOUNTER — Encounter: Payer: Self-pay | Admitting: Nurse Practitioner

## 2022-07-30 ENCOUNTER — Ambulatory Visit: Payer: Medicare HMO | Admitting: Nurse Practitioner

## 2022-07-30 VITALS — BP 140/70 | HR 66 | Ht 70.0 in | Wt 266.0 lb

## 2022-07-30 DIAGNOSIS — E1122 Type 2 diabetes mellitus with diabetic chronic kidney disease: Secondary | ICD-10-CM | POA: Diagnosis not present

## 2022-07-30 DIAGNOSIS — E559 Vitamin D deficiency, unspecified: Secondary | ICD-10-CM

## 2022-07-30 DIAGNOSIS — I10 Essential (primary) hypertension: Secondary | ICD-10-CM | POA: Diagnosis not present

## 2022-07-30 DIAGNOSIS — E1142 Type 2 diabetes mellitus with diabetic polyneuropathy: Secondary | ICD-10-CM | POA: Diagnosis not present

## 2022-07-30 DIAGNOSIS — E038 Other specified hypothyroidism: Secondary | ICD-10-CM

## 2022-07-30 DIAGNOSIS — E1165 Type 2 diabetes mellitus with hyperglycemia: Secondary | ICD-10-CM

## 2022-07-30 DIAGNOSIS — E11649 Type 2 diabetes mellitus with hypoglycemia without coma: Secondary | ICD-10-CM | POA: Insufficient documentation

## 2022-07-30 DIAGNOSIS — I509 Heart failure, unspecified: Secondary | ICD-10-CM | POA: Diagnosis not present

## 2022-07-30 DIAGNOSIS — J449 Chronic obstructive pulmonary disease, unspecified: Secondary | ICD-10-CM | POA: Diagnosis not present

## 2022-07-30 DIAGNOSIS — I110 Hypertensive heart disease with heart failure: Secondary | ICD-10-CM | POA: Diagnosis not present

## 2022-07-30 DIAGNOSIS — E782 Mixed hyperlipidemia: Secondary | ICD-10-CM | POA: Diagnosis not present

## 2022-07-30 LAB — POCT GLYCOSYLATED HEMOGLOBIN (HGB A1C): Hemoglobin A1C: 6 % — AB (ref 4.0–5.6)

## 2022-07-30 MED ORDER — ALBUTEROL SULFATE HFA 108 (90 BASE) MCG/ACT IN AERS: INHALATION_SPRAY | RESPIRATORY_TRACT | 10 refills | Status: AC

## 2022-07-30 MED ORDER — NOVOLIN 70/30 (70-30) 100 UNIT/ML ~~LOC~~ SUSP: 35.0000 [IU] | Freq: Two times a day (BID) | SUBCUTANEOUS | 3 refills | Status: DC

## 2022-07-30 NOTE — Assessment & Plan Note (Signed)
Patient's blood pressure 171/67 today.  Again he did not take his medications this morning and encouraged to take blood pressure medications before his visits.  -Continue metoprolol, Imdur, and spironolactone

## 2022-07-30 NOTE — Assessment & Plan Note (Signed)
Chronic problem Patient reports his CPAP is over 81 years old. Last sleep study in 2017 Sleep study

## 2022-07-30 NOTE — Progress Notes (Signed)
07/30/2022  Endocrinology follow-up note  Subjective:    Patient ID: [name], male    DOB: Mar 29, 1941, PCP Fayrene Helper, MD   Past Medical History:  Diagnosis Date   Abnormality of gait 05/01/2015   Anemia 04/19/2015   Annual physical exam 12/11/2013   Arthritis    "left leg; knees" (05/07/2017)   Arthritis of knee    CAD (coronary artery disease)    CHF (congestive heart failure) ([place])    Phreesia 08/08/2020   Choking 07/13/2015   Chronic bronchitis ([place])    "get it q yr"   Chronic lower back pain    "since I was a teen" (05/07/2017)   COPD (chronic obstructive pulmonary disease) ([place])    CVA (cerebral vascular accident) ([place]) 05/2015   R sided weakness (05/07/2017)   Diabetes mellitus without complication ([place])    Phreesia 08/08/2020   GERD (gastroesophageal reflux disease)    Gout    History of colonic polyps 04/03/2017   Added automatically from request for surgery [id number]   Hyperlipidemia    Hypertension    Myocardial infarction ([place]) 1999   Obesity    OSA on CPAP    Oxygen deficiency    Phreesia 08/08/2020   Pneumonia    "a few times; last time was ~ 06/2013" (05/07/2017)   Psoriasis    Stroke ([place])    Phreesia 08/08/2020   Thyroid disease    Phreesia 08/08/2020   Tussive syncope 05/01/2015   Type II diabetes mellitus ([place])    Vitamin B12 deficiency 05/01/2015   Past Surgical History:  Procedure Laterality Date   APPENDECTOMY     CATARACT EXTRACTION W/PHACO Left 10/07/2016   Procedure: CATARACT EXTRACTION PHACO AND INTRAOCULAR LENS PLACEMENT ([place]);  Surgeon: Rutherford Guys, MD;  Location: [facility];  Service: Ophthalmology;  Laterality: Left;  CDE: 13.49   CATARACT EXTRACTION W/PHACO Right 10/28/2016   Procedure: CATARACT EXTRACTION PHACO AND INTRAOCULAR LENS PLACEMENT ([facility]);  Surgeon: Rutherford Guys, MD;  Location: [facility];  Service: Ophthalmology;  Laterality: Right;  CDE: 16.71   COLONOSCOPY  03/17/2012   Procedure: COLONOSCOPY;  Surgeon: Rogene Houston, MD;  Location: [facility];  Service: Endoscopy;  Laterality: N/A;  830   COLONOSCOPY N/A 06/25/2017   Procedure: COLONOSCOPY;  Surgeon: Rogene Houston, MD;  Location: [facility];  Service: Endoscopy;  Laterality: N/A;  [place]- 2009-08/30/2013   "counting today's, I have 5 stents" (08/30/2013)   CORONARY ANGIOPLASTY WITH STENT PLACEMENT  05/07/2017   CORONARY STENT INTERVENTION N/A 05/07/2017   Procedure: CORONARY STENT INTERVENTION;  Surgeon: Charolette Forward, MD;  Location: [facility];  Service: Cardiovascular;  Laterality: N/A;   CYSTOSCOPY WITH URETHRAL DILATATION N/A 11/26/2012   Procedure: CYSTOSCOPY WITH URETHRAL DILATATION;  Surgeon: Malka So, MD;  Location: [facility];  Service: Urology;  Laterality: N/A;   LEFT HEART CATH AND CORONARY ANGIOGRAPHY N/A 05/07/2017   Procedure: LEFT HEART CATH AND CORONARY ANGIOGRAPHY;  Surgeon: Charolette Forward, MD;  Location: [facility];  Service: Cardiovascular;  Laterality: N/A;   LEFT HEART CATHETERIZATION WITH CORONARY ANGIOGRAM N/A 08/30/2013   Procedure: LEFT HEART CATHETERIZATION WITH CORONARY ANGIOGRAM;  Surgeon: Clent Demark, MD;  Location: [facility]ATH LAB;  Service: Cardiovascular;  Laterality: N/A;   PERCUTANEOUS CORONARY STENT INTERVENTION (PCI-S) Right 08/30/2013   Procedure: PERCUTANEOUS CORONARY STENT INTERVENTION (PCI-S);  Surgeon: Clent Demark, MD;  Location: [facility] CATH LAB;  Service: Cardiovascular;  Laterality: Right;   Social History   Socioeconomic History   Marital status: Married    Spouse name: Not on file   Number of children: 2   Years of education: 11   Highest education level: 12th grade  Occupational History   Occupation: Brewing technologist     Comment: retired   Tobacco Use   Smoking status: Former    Packs/day: 0.50    Years: 33.00    Total pack years: 16.50    Types: Cigarettes    Quit date: 11/29/2002    Years since quitting: 19.6    Smokeless tobacco: Never  Vaping Use   Vaping Use: Never used  Substance and Sexual Activity   Alcohol use: No   Drug use: No   Sexual activity: Not Currently  Other Topics Concern   Not on file  Social History Narrative   Patient drinks about 3 cups of soda daily.    Patient is left handed.    Lives alone with wife    Social Determinants of Health   Financial Resource Strain: Low Risk  (07/21/2022)   Overall Financial Resource Strain (CARDIA)    Difficulty of Paying Living Expenses: Not hard at all  Food Insecurity: Unknown (08/08/2019)   Hunger Vital Sign    Worried About Running Out of Food in the Last Year: Not on file    Ran Out of Food in the Last Year: Never true  Transportation Needs: No Transportation Needs (07/21/2022)   PRAPARE - Hydrologist (Medical): No    Lack of Transportation (Non-Medical): No  Physical Activity: Inactive (08/08/2020)   Exercise Vital Sign    Days of Exercise per Week: 0 days    Minutes of Exercise per Session: 0 min  Stress: No Stress Concern Present (08/08/2019)   [place]    Feeling of Stress : Not at all  Social Connections: Moderately Integrated (08/08/2020)   Social Connection and Isolation Panel [NHANES]    Frequency of Communication with Friends and Family: Three times a week    Frequency of Social Gatherings with Friends and Family: Three times a week    Attends Religious Services: 1 to 4 times per year    Active Member of Clubs or Organizations: No    Attends Archivist Meetings: Never    Marital Status: Married   Outpatient Encounter Medications as of 07/30/2022  Medication Sig   acetaminophen (TYLENOL) 500 MG tablet Take 1,000 mg by mouth every 6 (six) hours as needed for moderate pain (left thigh).    allopurinol (ZYLOPRIM) 300 MG tablet TAKE 1 TABLET EVERY DAY   Blood Glucose Calibration (TRUE METRIX LEVEL 1) Low SOLN Use as  directed   Blood Glucose Monitoring Suppl (TRUE METRIX METER) w/Device KIT USE AS DIRECTED   budesonide-formoterol (SYMBICORT) 80-4.5 MCG/ACT inhaler Inhale 2 puffs into the lungs daily.   cephALEXin (KEFLEX) 500 MG capsule Take 1 capsule (500 mg total) by mouth 3 (three) times daily.   clopidogrel (PLAVIX) 75 MG tablet TAKE 1 TABLET EVERY DAY   DROPLET INSULIN SYRINGE 31G X 5/16" 1 ML MISC USE AS DIRECTED IN THE MORNING AND AT BEDTIME   ezetimibe (ZETIA) 10 MG tablet TAKE 1 TABLET EVERY DAY   famotidine (PEPCID) 20 MG tablet Take 1 tablet (20 mg total) by mouth 2 (two) times daily.   glucose blood (TRUE METRIX BLOOD GLUCOSE TEST) test strip 4  times daily testing dx e11.65   isosorbide mononitrate (IMDUR) 30 MG 24 hr tablet TAKE 1 TABLET EVERY DAY   levETIRAcetam (KEPPRA) 500 MG tablet TAKE 1 TABLET TWICE DAILY   levothyroxine (SYNTHROID) 112 MCG tablet TAKE 1 TABLET EVERY DAY   methocarbamol (ROBAXIN) 500 MG tablet Take 1 tablet (500 mg total) by mouth 2 (two) times daily.   metoprolol tartrate (LOPRESSOR) 50 MG tablet TAKE 1/2 TABLET TWICE DAILY   pantoprazole (PROTONIX) 40 MG tablet TAKE 1 TABLET EVERY DAY BEFORE BREAKFAST   polyethylene glycol (MIRALAX / GLYCOLAX) 17 g packet Take 17 g by mouth daily as needed.   pravastatin (PRAVACHOL) 40 MG tablet TAKE 2 TABLETS EVERY DAY   spironolactone (ALDACTONE) 50 MG tablet TAKE 1 TABLET EVERY DAY (DOSE INCREASE)   sucralfate (CARAFATE) 1 GM/10ML suspension Take 10 mLs (1 g total) by mouth 4 (four) times daily -  with meals and at bedtime.   TRUEplus Lancets 33G MISC TEST BLOOD SUGAR FOUR TIMES DAILY   UNABLE TO FIND CPAP mask and tubing  DX G47.33   vitamin B-12 (CYANOCOBALAMIN) 1000 MCG tablet Take 1,000 mcg by mouth daily.   Vitamin D, Ergocalciferol, (DRISDOL) 1.25 MG (50000 UNIT) CAPS capsule Take by mouth.   [DISCONTINUED] albuterol (VENTOLIN HFA) 108 (90 Base) MCG/ACT inhaler INHALE 1 TO 2 PUFFS EVERY 6 HOURS AS NEEDED FOR WHEEZING OR  SHORTNESS OF BREATH   [DISCONTINUED] NOVOLIN 70/30 (70-30) 100 UNIT/ML injection INJECT 55 UNITS UNDER THE SKIN WITH BREAKFAST AND 45 UNITS WITH SUPPER.   albuterol (VENTOLIN HFA) 108 (90 Base) MCG/ACT inhaler INHALE 1 TO 2 PUFFS EVERY 6 HOURS AS NEEDED FOR WHEEZING OR SHORTNESS OF BREATH   aspirin EC 81 MG tablet Take 81 mg by mouth daily.   insulin NPH-regular Human (NOVOLIN 70/30) (70-30) 100 UNIT/ML injection Inject 35-45 Units into the skin 2 (two) times daily with a meal. Take 45 units with breakfast and 35 units with supper if glucose is above 90 and eating.   nitroGLYCERIN (NITROSTAT) 0.4 MG SL tablet Place 1 tablet (0.4 mg total) under the tongue every 5 (five) minutes as needed for chest pain.   [DISCONTINUED] nirmatrelvir & ritonavir (PAXLOVID, 300/100,) 20 x 150 MG & 10 x '100MG'$  TBPK Take 3 tablets by mouth 2 (two) times daily.   [DISCONTINUED] sennosides-docusate sodium (SENOKOT-S) 8.6-50 MG tablet Take 1 tablet by mouth daily.   No facility-administered encounter medications on file as of 07/30/2022.   ALLERGIES: No Known Allergies VACCINATION STATUS: Immunization History  Administered Date(s) Administered   Fluad Quad(high Dose 65+) 01/31/2019, 03/01/2020, 06/20/2021, 03/11/2022   Influenza Whole 03/18/2005   Influenza, High Dose Seasonal PF 02/22/2018   Influenza,inj,Quad PF,6+ Mos 03/19/2017   PFIZER(Purple Top)SARS-COV-2 Vaccination 06/25/2019, 07/16/2019, 04/22/2020   Pneumococcal Conjugate-13 12/08/2013   Pneumococcal Polysaccharide-23 06/09/2012   Td 12/04/2003   Tdap 03/06/2017    Diabetes He presents for his follow-up diabetic visit. He has type 2 diabetes mellitus. Onset time: He was diagnosed at approximate age of 84 years. His disease course has been stable. There are no hypoglycemic associated symptoms. Pertinent negatives for hypoglycemia include no confusion, headaches, nervousness/anxiousness, pallor, seizures or tremors. Pertinent negatives for diabetes  include no chest pain, no fatigue, no polydipsia, no polyphagia, no polyuria, no weakness and no weight loss. There are no hypoglycemic complications. Symptoms are stable. Diabetic complications include a CVA, heart disease, nephropathy and peripheral neuropathy. Risk factors for coronary artery disease include dyslipidemia, diabetes mellitus, male sex, obesity, tobacco exposure,  sedentary lifestyle and hypertension. Current diabetic treatment includes insulin injections. He is compliant with treatment most of the time. His weight is fluctuating minimally. He is following a generally healthy diet. When asked about meal planning, he reported none. He has not had a previous visit with a dietitian. He rarely participates in exercise. His home blood glucose trend is fluctuating minimally. His breakfast blood glucose range is generally 90-110 mg/dl. His dinner blood glucose range is generally 140-180 mg/dl. (He presents today with his logs, no meter, showing tightening glycemic profile overall.  His POCT A1c today is 6%, improving from previous visit of 6.6%.  He did go to the ED recently for hypoglycemia and was noted to have UTI which was treated with antibiotics.   He has not had hypoglycemia since. ) An ACE inhibitor/angiotensin II receptor blocker is not being taken. He does not see a podiatrist.Eye exam is current.  Hyperlipidemia This is a chronic problem. The current episode started more than 1 year ago. The problem is uncontrolled. Recent lipid tests were reviewed and are variable (elevated triglycerides). Exacerbating diseases include chronic renal disease, diabetes, hypothyroidism and obesity. Factors aggravating his hyperlipidemia include beta blockers and fatty foods. Associated symptoms include shortness of breath. Pertinent negatives include no chest pain or myalgias. Current antihyperlipidemic treatment includes statins. The current treatment provides moderate improvement of lipids. Compliance problems  include adherence to exercise and adherence to diet (r/t decreased mobility from prior CVA).  Risk factors for coronary artery disease include diabetes mellitus, dyslipidemia, hypertension, male sex, a sedentary lifestyle and obesity.  Hypertension This is a chronic problem. The current episode started more than 1 year ago. The problem is unchanged. The problem is controlled. Associated symptoms include shortness of breath. Pertinent negatives include no chest pain, headaches, neck pain or palpitations. Agents associated with hypertension include thyroid hormones. Risk factors for coronary artery disease include dyslipidemia, diabetes mellitus, sedentary lifestyle, male gender and obesity. Past treatments include beta blockers and diuretics. The current treatment provides mild improvement. There are no compliance problems.  Hypertensive end-organ damage includes kidney disease, CAD/MI and CVA. Identifiable causes of hypertension include chronic renal disease and a thyroid problem.  Thyroid Problem Presents for follow-up visit. Patient reports no anxiety, cold intolerance, constipation, depressed mood, diarrhea, fatigue, heat intolerance, palpitations, tremors, weight gain or weight loss. The symptoms have been stable. Past treatments include levothyroxine. His past medical history is significant for diabetes and hyperlipidemia.     Review of systems  Constitutional: + stable body weight,  current Body mass index is 38.17 kg/m. , no fatigue, no subjective hyperthermia, no subjective hypothermia Eyes: no blurry vision, no xerophthalmia ENT: no sore throat, no nodules palpated in throat, no dysphagia/odynophagia, no hoarseness Cardiovascular: no chest pain, no shortness of breath, no palpitations, no leg swelling Respiratory: no cough, no shortness of breath Gastrointestinal: no nausea/vomiting/diarrhea Musculoskeletal: no muscle/joint aches, walks with platform walker due to mobility deficits from  previous CVA Skin: no rashes, no hyperemia Neurological: no tremors, no tingling, no dizziness, numbness reported in RUE and RLE (from previous stroke) Psychiatric: no depression, no anxiety    Objective:    BP (!) 140/70 (BP Location: Right Arm, Patient Position: Sitting, Cuff Size: Large) Comment: Retake manuel cuff  Pulse 66   Ht '5\' 10"'$  (1.778 m)   Wt 266 lb (120.7 kg)   BMI 38.17 kg/m   Wt Readings from Last 3 Encounters:  07/30/22 266 lb (120.7 kg)  07/29/22 236 lb (107 kg)  07/19/22  262 lb 5.6 oz (119 kg)     BP Readings from Last 3 Encounters:  07/30/22 (!) 140/70  07/29/22 (!) 171/67  07/19/22 (!) 181/90    Physical Exam- Limited  Constitutional:  Body mass index is 38.17 kg/m. , not in acute distress, normal state of mind Eyes:  EOMI, no exophthalmos Musculoskeletal: no gross deformities, walks with walker due to previous CVA Skin:  no rashes, no hyperemia Neurological: no tremor with outstretched hands  Diabetic Foot Exam - Simple   Simple Foot Form Diabetic Foot exam was performed with the following findings: Yes 07/30/2022 10:15 AM  Visual Inspection See comments: Yes Sensation Testing See comments: Yes Pulse Check Posterior Tibialis and Dorsalis pulse intact bilaterally: Yes Comments No sensation to monofilament tool to right leg (previous stroke side); dry flaky skin to bilateral feet; onychomycosis bilaterally     CMP     Component Value Date/Time   NA 137 07/25/2022 0951   K 4.3 07/25/2022 0951   CL 102 07/25/2022 0951   CO2 21 07/25/2022 0951   GLUCOSE 88 07/25/2022 0951   GLUCOSE 263 (H) 07/19/2022 0028   BUN 12 07/25/2022 0951   CREATININE 1.27 07/25/2022 0951   CREATININE 1.12 02/16/2020 0838   CALCIUM 9.1 07/25/2022 0951   PROT 6.7 07/25/2022 0951   ALBUMIN 3.7 (L) 07/25/2022 0951   AST 13 07/25/2022 0951   ALT 10 07/25/2022 0951   ALKPHOS 80 07/25/2022 0951   BILITOT 0.4 07/25/2022 0951   GFRNONAA 51 (L) 07/19/2022 0022    GFRNONAA 63 02/16/2020 0838   GFRAA 53 (L) 06/20/2020 0907   GFRAA 73 02/16/2020 0838     Diabetic Labs (most recent): Lab Results  Component Value Date   HGBA1C 6.0 (A) 07/30/2022   HGBA1C 6.6 (A) 01/29/2022   HGBA1C 7.0 07/29/2021   MICROALBUR 6.4 10/11/2019   MICROALBUR 4.2 07/13/2018   MICROALBUR 7.8 (H) 03/23/2014     Lipid Panel ( most recent) Lipid Panel     Component Value Date/Time   CHOL 118 03/11/2022 0928   TRIG 185 (H) 03/11/2022 0928   HDL 28 (L) 03/11/2022 0928   CHOLHDL 4.2 03/11/2022 0928   CHOLHDL 5.3 (H) 10/11/2019 0924   VLDL 35 04/20/2017 1130   LDLCALC 59 03/11/2022 0928   LDLCALC 67 10/11/2019 0924   LDLDIRECT 50 08/18/2014 0737      Assessment & Plan:   1) Type 2 diabetes mellitus with multiple competitions including diabetic nephropathy  -His diabetes is complicated by coronary artery disease, CVA, CKD and patient remains at a high risk for more acute and chronic complications of diabetes which include CAD, CVA, CKD, retinopathy, and neuropathy. These are all discussed in detail with the patient.  He presents today with his logs, no meter, showing tightening glycemic profile overall.  His POCT A1c today is 6%, improving from previous visit of 6.6%.  He did go to the ED recently for hypoglycemia and was noted to have UTI which was treated with antibiotics.   He has not had hypoglycemia since.   - Recent labs reviewed.  - Nutritional counseling repeated at each appointment due to patients tendency to fall back in to old habits.  - The patient admits there is a room for improvement in their diet and drink choices. -  Suggestion is made for the patient to avoid simple carbohydrates from their diet including Cakes, Sweet Desserts / Pastries, Ice Cream, Soda (diet and regular), Sweet Tea, Candies, Chips, Cookies, Sweet Pastries,  Store Bought Juices, Alcohol in Excess of 1-2 drinks a day, Artificial Sweeteners, Coffee Creamer, and "Sugar-free" Products.  This will help patient to have stable blood glucose profile and potentially avoid unintended weight gain.   - I encouraged the patient to switch to unprocessed or minimally processed complex starch and increased protein intake (animal or plant source), fruits, and vegetables.   - Patient is advised to stick to a routine mealtimes to eat 3 meals a day and avoid unnecessary snacks (to snack only to correct hypoglycemia).  - I have approached patient with the following individualized plan to manage diabetes and patient agrees.  - He has done reasonably very well on premixed Novolin 70/30. He wishes to stay on the same regimen for cost reasons.   - Given his tightening glycemic profile, will lower his Novolin 70/30 to 45 units with breakfast and 35 units with supper if glucose is above 90 and he is eating.    -He is advised to continue monitoring blood glucose at least 3 times per day, before injecting insulin at breakfast and supper, and before bed.  He is instructed to call the clinic if he has readings less than 70 or greater than 300 for 3 tests in a row.  -He is warned not to take insulin without proper monitoring per orders.  - Patient specific target  for A1c; LDL, HDL, Triglycerides, and  Waist Circumference were discussed in detail.  2) BP/HTN:  His blood pressure is controlled to target for his age.  He is advised to continue Metoprolol 25 mg po twice daily, Isosorbide 30 mg po daily, and Spironolactone 50 mg po daily.    3) Lipids/HPL:  His most recent lipid panel from 02/19/22 shows controlled LDL of 59 and elevated triglycerides of 185.  He is advised to continue Pravastatin 40 mg po daily at bedtime.  Side effects and precautions discussed with him.  He is advised to avoid fried foods and butter.    4)  Weight/Diet: His Body mass index is 38.17 kg/m.-  He is a candidate for modest weight loss.  He cannot exercise optimally, limited by pulmonary sufficiency due to COPD and previous  CVA.  CDE consult in progress, exercise, and carbohydrates information provided.  5) Hypothyroidism:  -His previsit thyroid function tests are consistent with under- replacement but he notes he missed several doses due to another medication he was taking for a while.  He is now back to taking it daily.  He is advised to continue current dose of Levothyroxine 100 mcg po daily before breakfast.  Will recheck prior to next visit and adjust dose accordingly.   - We discussed about the correct intake of his thyroid hormone, on empty stomach at fasting, with water, separated by at least 30 minutes from breakfast and other medications,  and separated by more than 4 hours from calcium, iron, multivitamins, acid reflux medications (PPIs). -Patient is made aware of the fact that thyroid hormone replacement is needed for life, dose to be adjusted by periodic monitoring of thyroid function tests.  6) Vitamin D deficiency His most recent vitamin D level on 09/14/20 was 25.3.  He is not sure if he is still taking his supplement as his wife manages his medication.  He is advised to start OTC Vitamin D3 5000 units daily until next measurement.  7) Chronic Care/Health Maintenance: -Patient is on Statin medications and encouraged to continue to follow up with Ophthalmology, nephrology, Podiatrist at least yearly or according to  recommendations, and advised to stay away from smoking. I have recommended yearly flu vaccine and pneumonia vaccination at least every 5 years;  and  sleep for at least 7 hours a day.   - I advised patient to maintain close follow up with Fayrene Helper, MD for primary care needs.     I spent  34  minutes in the care of the patient today including review of labs from [place], Lipids, Thyroid Function, Hematology (current and previous including abstractions from other facilities); face-to-face time discussing  his blood glucose readings/logs, discussing hypoglycemia and hyperglycemia  episodes and symptoms, medications doses, his options of short and long term treatment based on the latest standards of care / guidelines;  discussion about incorporating lifestyle medicine;  and documenting the encounter. Risk reduction counseling performed per USPSTF guidelines to reduce obesity and cardiovascular risk factors.     Please refer to Patient Instructions for Blood Glucose Monitoring and Insulin/Medications Dosing Guide"  in media tab for additional information. Please  also refer to " Patient Self Inventory" in the Media  tab for reviewed elements of pertinent patient history.  [name] participated in the discussions, expressed understanding, and voiced agreement with the above plans.  All questions were answered to his satisfaction. he is encouraged to contact clinic should he have any questions or concerns prior to his return visit.   Follow up plan: -Return in about 6 months (around 01/30/2023) for Diabetes F/U with A1c in office, Thyroid follow up, Previsit labs, Bring meter and logs.  Rayetta Pigg, [facility] [address] [place], [place] [address] Phone: [phone] Fax: [phone]  07/30/2022, 10:23 AM

## 2022-07-30 NOTE — Assessment & Plan Note (Signed)
Patient has not had any hypoglycemic episodes since ER visit.  Hypoglycemia in the setting of recent abdominal pain.  After discussion I suspect he was not eating as much and taking the same amount of insulin.  His abdominal pain has improved since being treated with antibiotics and his bowel movements are regular.  He is on insulin 70/30 45 units in a.m. and 55 units in the p.m.  He has follow-up with endocrinology tomorrow.  He is following them for long time and he is asymptomatic today so I will defer any changes.

## 2022-07-31 ENCOUNTER — Telehealth: Payer: Self-pay | Admitting: Family Medicine

## 2022-07-31 NOTE — Telephone Encounter (Signed)
Jeanne Ivan called from [place] insurance needs verbal verification if patient has a chronic condition , call back # [phone] with reference # [id number]

## 2022-08-01 ENCOUNTER — Other Ambulatory Visit: Payer: Self-pay | Admitting: Family Medicine

## 2022-08-01 DIAGNOSIS — I2510 Atherosclerotic heart disease of native coronary artery without angina pectoris: Secondary | ICD-10-CM

## 2022-08-25 ENCOUNTER — Ambulatory Visit (INDEPENDENT_AMBULATORY_CARE_PROVIDER_SITE_OTHER): Payer: Medicare HMO | Admitting: Internal Medicine

## 2022-08-25 ENCOUNTER — Encounter: Payer: Self-pay | Admitting: Internal Medicine

## 2022-08-25 DIAGNOSIS — Z0000 Encounter for general adult medical examination without abnormal findings: Secondary | ICD-10-CM

## 2022-08-25 NOTE — Patient Instructions (Signed)
  [name] , Thank you for taking time to come for your Medicare Wellness Visit. I appreciate your ongoing commitment to your health goals. Please review the following plan we discussed and let me know if I can assist you in the future.   These are the goals we discussed:  Goals       Exercise 3x per week (30 min per time)      Recommend starting chair exercises 3 times a week for at least 30 minutes at a time as tolerated.      Patient Stated      Prevent falls       Patient Stated      Patient states that his goal is to continue serving the good Lord and keep living.      water (pt-stated)      Increase water intake         This is a list of the screening recommended for you and due dates:  Health Maintenance  Topic Date Due   Zoster (Shingles) Vaccine (1 of 2) Never done   Yearly kidney health urinalysis for diabetes  01/04/2022   Eye exam for diabetics  01/10/2022   COVID-19 Vaccine (4 - 2023-24 season) 01/17/2022   Medicare Annual Wellness Visit  08/16/2022   Flu Shot  12/18/2022   Hemoglobin A1C  01/30/2023   Yearly kidney function blood test for diabetes  07/25/2023   Complete foot exam   07/30/2023   DTaP/Tdap/Td vaccine (3 - Td or Tdap) 03/07/2027   Pneumonia Vaccine  Completed   HPV Vaccine  Aged Out

## 2022-08-25 NOTE — Progress Notes (Signed)
Subjective:  This is a telephone encounter between [name] and [name] on 08/25/2022 for AWV. The visit was conducted with the patient located at home and [name] at [facility]. The patient's identity was confirmed using their DOB and current address. The patient has consented to being evaluated through a telephone encounter and understands the associated risks (an examination cannot be done and the patient may need to come in for an appointment) / benefits (allows the patient to remain at home, decreasing exposure to coronavirus).    [name] is a 82 y.o. male who presents for Medicare Annual/Subsequent preventive examination.  Review of Systems    Review of Systems  All other systems reviewed and are negative.     Objective:    There were no vitals filed for this visit. There is no height or weight on file to calculate BMI.     08/25/2022    1:09 PM 07/19/2022    2:35 AM 04/09/2022    5:20 PM 04/03/2022    2:28 PM 04/03/2022   10:36 AM 08/15/2021    1:08 PM 09/04/2019   10:00 AM  Advanced Directives  Does Patient Have a Medical Advance Directive? No No No No No No No  Would patient like information on creating a medical advance directive? No - Patient declined No - Patient declined No - Patient declined No - Patient declined  No - Patient declined     Current Medications (verified) Outpatient Encounter Medications as of 08/25/2022  Medication Sig   acetaminophen (TYLENOL) 500 MG tablet Take 1,000 mg by mouth every 6 (six) hours as needed for moderate pain (left thigh).    albuterol (VENTOLIN HFA) 108 (90 Base) MCG/ACT inhaler INHALE 1 TO 2 PUFFS EVERY 6 HOURS AS NEEDED FOR WHEEZING OR SHORTNESS OF BREATH   allopurinol (ZYLOPRIM) 300 MG tablet TAKE 1 TABLET EVERY DAY   aspirin EC 81 MG tablet Take 81 mg by mouth daily.   Blood Glucose Calibration (TRUE METRIX LEVEL 1) Low SOLN Use as directed   Blood Glucose Monitoring Suppl (TRUE METRIX METER) w/Device KIT USE  AS DIRECTED   budesonide-formoterol (SYMBICORT) 80-4.5 MCG/ACT inhaler Inhale 2 puffs into the lungs daily.   cephALEXin (KEFLEX) 500 MG capsule Take 1 capsule (500 mg total) by mouth 3 (three) times daily.   clopidogrel (PLAVIX) 75 MG tablet TAKE 1 TABLET EVERY DAY   DROPLET INSULIN SYRINGE 31G X 5/16" 1 ML MISC USE AS DIRECTED IN THE MORNING AND AT BEDTIME   ezetimibe (ZETIA) 10 MG tablet TAKE 1 TABLET EVERY DAY   famotidine (PEPCID) 20 MG tablet Take 1 tablet (20 mg total) by mouth 2 (two) times daily.   glucose blood (TRUE METRIX BLOOD GLUCOSE TEST) test strip 4 times daily testing dx e11.65   insulin NPH-regular Human (NOVOLIN 70/30) (70-30) 100 UNIT/ML injection Inject 35-45 Units into the skin 2 (two) times daily with a meal. Take 45 units with breakfast and 35 units with supper if glucose is above 90 and eating.   isosorbide mononitrate (IMDUR) 30 MG 24 hr tablet TAKE 1 TABLET EVERY DAY   levETIRAcetam (KEPPRA) 500 MG tablet TAKE 1 TABLET TWICE DAILY   levothyroxine (SYNTHROID) 112 MCG tablet TAKE 1 TABLET EVERY DAY   methocarbamol (ROBAXIN) 500 MG tablet Take 1 tablet (500 mg total) by mouth 2 (two) times daily.   metoprolol tartrate (LOPRESSOR) 50 MG tablet TAKE 1/2 TABLET TWICE DAILY   nitroGLYCERIN (NITROSTAT) 0.4 MG SL tablet  Place 1 tablet (0.4 mg total) under the tongue every 5 (five) minutes as needed for chest pain.   pantoprazole (PROTONIX) 40 MG tablet TAKE 1 TABLET EVERY DAY BEFORE BREAKFAST   polyethylene glycol (MIRALAX / GLYCOLAX) 17 g packet Take 17 g by mouth daily as needed.   pravastatin (PRAVACHOL) 40 MG tablet TAKE 2 TABLETS EVERY DAY   spironolactone (ALDACTONE) 50 MG tablet TAKE 1 TABLET EVERY DAY (DOSE INCREASE)   sucralfate (CARAFATE) 1 GM/10ML suspension Take 10 mLs (1 g total) by mouth 4 (four) times daily -  with meals and at bedtime.   TRUEplus Lancets 33G MISC TEST BLOOD SUGAR FOUR TIMES DAILY   UNABLE TO FIND CPAP mask and tubing  DX G47.33   vitamin B-12  (CYANOCOBALAMIN) 1000 MCG tablet Take 1,000 mcg by mouth daily.   Vitamin D, Ergocalciferol, (DRISDOL) 1.25 MG (50000 UNIT) CAPS capsule Take by mouth.   No facility-administered encounter medications on file as of 08/25/2022.    Allergies (verified) Patient has no known allergies.   History: Past Medical History:  Diagnosis Date   Abnormality of gait 05/01/2015   Anemia 04/19/2015   Annual physical exam 12/11/2013   Arthritis    "left leg; knees" (05/07/2017)   Arthritis of knee    CAD (coronary artery disease)    CHF (congestive heart failure) (HCC)    Phreesia 08/08/2020   Choking 07/13/2015   Chronic bronchitis (HCC)    "get it q yr"   Chronic lower back pain    "since I was a teen" (05/07/2017)   COPD (chronic obstructive pulmonary disease) (HCC)    CVA (cerebral vascular accident) (HCC) 05/2015   R sided weakness (05/07/2017)   Diabetes mellitus without complication (HCC)    Phreesia 08/08/2020   GERD (gastroesophageal reflux disease)    Gout    History of colonic polyps 04/03/2017   Added automatically from request for surgery [id number]   Hyperlipidemia    Hypertension    Myocardial infarction (HCC) 1999   Obesity    OSA on CPAP    Oxygen deficiency    Phreesia 08/08/2020   Pneumonia    "a few times; last time was ~ 06/2013" (05/07/2017)   Psoriasis    Stroke (HCC)    Phreesia 08/08/2020   Thyroid disease    Phreesia 08/08/2020   Tussive syncope 05/01/2015   Type II diabetes mellitus (HCC)    Vitamin B12 deficiency 05/01/2015   Past Surgical History:  Procedure Laterality Date   APPENDECTOMY     CATARACT EXTRACTION W/PHACO Left 10/07/2016   Procedure: CATARACT EXTRACTION PHACO AND INTRAOCULAR LENS PLACEMENT (IOC);  Surgeon: Jethro Bolus, MD;  Location: [facility];  Service: Ophthalmology;  Laterality: Left;  CDE: 13.49   CATARACT EXTRACTION W/PHACO Right 10/28/2016   Procedure: CATARACT EXTRACTION PHACO AND INTRAOCULAR LENS PLACEMENT (IOC);  Surgeon: Jethro Bolus,  MD;  Location: [facility];  Service: Ophthalmology;  Laterality: Right;  CDE: 16.71   COLONOSCOPY  03/17/2012   Procedure: COLONOSCOPY;  Surgeon: Malissa Hippo, MD;  Location: [facility];  Service: Endoscopy;  Laterality: N/A;  830   COLONOSCOPY N/A 06/25/2017   Procedure: COLONOSCOPY;  Surgeon: Malissa Hippo, MD;  Location: [facility];  Service: Endoscopy;  Laterality: N/A;  930   CORONARY ANGIOPLASTY WITH STENT PLACEMENT  1999- 2009-08/30/2013   "counting today's, I have 5 stents" (08/30/2013)   CORONARY ANGIOPLASTY WITH STENT PLACEMENT  05/07/2017   CORONARY STENT INTERVENTION N/A 05/07/2017   Procedure:  CORONARY STENT INTERVENTION;  Surgeon: Rinaldo CloudHarwani, Mohan, MD;  Location: [facility];  Service: Cardiovascular;  Laterality: N/A;   CYSTOSCOPY WITH URETHRAL DILATATION N/A 11/26/2012   Procedure: CYSTOSCOPY WITH URETHRAL DILATATION;  Surgeon: Anner CreteJohn J Wrenn, MD;  Location: [facility];  Service: Urology;  Laterality: N/A;   LEFT HEART CATH AND CORONARY ANGIOGRAPHY N/A 05/07/2017   Procedure: LEFT HEART CATH AND CORONARY ANGIOGRAPHY;  Surgeon: Rinaldo CloudHarwani, Mohan, MD;  Location: [facility];  Service: Cardiovascular;  Laterality: N/A;   LEFT HEART CATHETERIZATION WITH CORONARY ANGIOGRAM N/A 08/30/2013   Procedure: LEFT HEART CATHETERIZATION WITH CORONARY ANGIOGRAM;  Surgeon: Robynn PaneMohan N Harwani, MD;  Location: [facility];  Service: Cardiovascular;  Laterality: N/A;   PERCUTANEOUS CORONARY STENT INTERVENTION (PCI-S) Right 08/30/2013   Procedure: PERCUTANEOUS CORONARY STENT INTERVENTION (PCI-S);  Surgeon: Robynn PaneMohan N Harwani, MD;  [facility];  Service: Cardiovascular;  Laterality: Right;   Family History  Problem Relation Age of Onset   Hypertension Mother    Diabetes Mother    Hypertension Father    Diabetes Brother    Stroke Daughter    Arthritis Other        Family History    Diabetes Other        family History    Social History   Socioeconomic History   Marital status:  Married    Spouse name: Not on file   Number of children: 2   Years of education: 11   Highest education level: 12th grade  Occupational History   Occupation: Restaurant manager, fast foodjanitorial services     Comment: retired   Tobacco Use   Smoking status: Former    Packs/day: 0.50    Years: 33.00    Additional pack years: 0.00    Total pack years: 16.50    Types: Cigarettes    Quit date: 11/29/2002    Years since quitting: 19.7   Smokeless tobacco: Never  Vaping Use   Vaping Use: Never used  Substance and Sexual Activity   Alcohol use: No   Drug use: No   Sexual activity: Not Currently  Other Topics Concern   Not on file  Social History Narrative   Patient drinks about 3 cups of soda daily.    Patient is left handed.    Lives alone with wife    Social Determinants of Health   Financial Resource Strain: Low Risk  (07/21/2022)   Overall Financial Resource Strain (CARDIA)    Difficulty of Paying Living Expenses: Not hard at all  Food Insecurity: Unknown (08/08/2019)   Hunger Vital Sign    Worried About Running Out of Food in the Last Year: Not on file    Ran Out of Food in the Last Year: Never true  Transportation Needs: No Transportation Needs (07/21/2022)   PRAPARE - Administrator, Civil ServiceTransportation    Lack of Transportation (Medical): No    Lack of Transportation (Non-Medical): No  Physical Activity: Inactive (08/08/2020)   Exercise Vital Sign    Days of Exercise per Week: 0 days    Minutes of Exercise per Session: 0 min  Stress: No Stress Concern Present (08/08/2019)   [employer] - Occupational Stress Questionnaire    Feeling of Stress : Not at all  Social Connections: Moderately Integrated (08/08/2020)   Social Connection and Isolation Panel [NHANES]    Frequency of Communication with Friends and Family: Three times a week    Frequency of Social Gatherings with Friends and Family: Three times a  week    Attends Religious Services: 1 to 4 times per year    Active Member of Clubs or  Organizations: No    Attends Banker Meetings: Never    Marital Status: Married    Tobacco Counseling Counseling given: Not Answered   Clinical Intake:  Pre-visit preparation completed: No  Pain : No/denies pain     Nutritional Status: BMI > 30  Obese Diabetes: Yes CBG done?: No Did pt. bring in CBG monitor from home?: No  How often do you need to have someone help you when you read instructions, pamphlets, or other written materials from your doctor or pharmacy?: 1 - Never What is the last grade level you completed in school?: 11th grade  Diabetic?No    Activities of Daily Living    08/25/2022    1:10 PM  In your present state of health, do you have any difficulty performing the following activities:  Hearing? 0  Vision? 0  Difficulty concentrating or making decisions? 0  Walking or climbing stairs? 0  Dressing or bathing? 0  Doing errands, shopping? 0    Patient Care Team: Kerri Perches, MD as PCP - Si Raider, MD as Consulting Physician (Pulmonary Disease) Rinaldo Cloud, MD as Consulting Physician (Cardiology) Jethro Bolus, MD as Consulting Physician (Ophthalmology) York Spaniel, MD (Inactive) as Consulting Physician (Neurology)  Indicate any recent Medical Services you may have received from other than [facility] providers in the past year (date may be approximate).     Assessment:   This is a routine wellness examination for Arkel.  Hearing/Vision screen No results found.  Dietary issues and exercise activities discussed:     Goals Addressed   None    Depression Screen    08/25/2022    1:10 PM 07/29/2022    9:53 AM 07/15/2022    9:27 AM 03/11/2022    8:55 AM 08/15/2021    1:08 PM 06/20/2021    8:09 AM 08/08/2020    9:08 AM  PHQ 2/9 Scores  PHQ - 2 Score 0 0 0 0 0 0 0    Fall Risk    08/25/2022    1:10 PM 07/29/2022    9:53 AM 07/15/2022    9:27 AM 03/11/2022    8:55 AM 09/25/2021    3:19 PM  Fall Risk    Falls in the past year? 1 1 1  0 0  Number falls in past yr: 0 1 0 0 0  Injury with Fall? 0 0 0 0 0  Risk for fall due to :   History of fall(s);Impaired balance/gait No Fall Risks Impaired mobility  Follow up   Falls evaluation completed Falls evaluation completed     FALL RISK PREVENTION PERTAINING TO THE HOME:  Any stairs in or around the home? No  If so, are there any without handrails? No  Home free of loose throw rugs in walkways, pet beds, electrical cords, etc? Yes  Adequate lighting in your home to reduce risk of falls? Yes   ASSISTIVE DEVICES UTILIZED TO PREVENT FALLS:  Life alert? Yes  Use of a cane, walker or w/c? Yes  Grab bars in the bathroom? No  Shower chair or bench in shower? Yes  Elevated toilet seat or a handicapped toilet? Yes     Cognitive Function:    05/01/2015    7:55 AM  MMSE - Mini Mental State Exam  Orientation to time 5  Orientation to Place 4  Registration 3  Attention/ Calculation 5  Recall 2  Language- name 2 objects 2  Language- repeat 1  Language- follow 3 step command 3  Language- read & follow direction 1  Write a sentence 1  Copy design 0  Total score 27        08/15/2021    1:12 PM 08/08/2020    9:15 AM 08/08/2019   11:18 AM 07/26/2018    9:58 AM 06/29/2017   10:30 AM  6CIT Screen  What Year? 0 points 0 points 0 points 0 points 0 points  What month? 0 points 0 points 0 points 0 points 0 points  What time? 0 points 0 points 0 points 0 points 0 points  Count back from 20 0 points 0 points 0 points 0 points 0 points  Months in reverse 0 points 0 points 0 points 0 points 0 points  Repeat phrase 0 points 0 points 0 points 2 points 0 points  Total Score 0 points 0 points 0 points 2 points 0 points    Immunizations Immunization History  Administered Date(s) Administered   Fluad Quad(high Dose 65+) 01/31/2019, 03/01/2020, 06/20/2021, 03/11/2022   Influenza Whole 03/18/2005   Influenza, High Dose Seasonal PF 02/22/2018    Influenza,inj,Quad PF,6+ Mos 03/19/2017   PFIZER(Purple Top)SARS-COV-2 Vaccination 06/25/2019, 07/16/2019, 04/22/2020   Pneumococcal Conjugate-13 12/08/2013   Pneumococcal Polysaccharide-23 06/09/2012   Td 12/04/2003   Tdap 03/06/2017    TDAP status: Up to date  Flu Vaccine status: Up to date  Pneumococcal vaccine status: Up to date  Covid-19 vaccine status: Information provided on how to obtain vaccines.   Qualifies for Shingles Vaccine? Yes   Zostavax completed No   Shingrix Completed?: No.    Education has been provided regarding the importance of this vaccine. Patient has been advised to call insurance company to determine out of pocket expense if they have not yet received this vaccine. Advised may also receive vaccine at local pharmacy or Health Dept. Verbalized acceptance and understanding.  Screening Tests Health Maintenance  Topic Date Due   Zoster Vaccines- Shingrix (1 of 2) Never done   Diabetic kidney evaluation - Urine ACR  01/04/2022   OPHTHALMOLOGY EXAM  01/10/2022   COVID-19 Vaccine (4 - 2023-24 season) 01/17/2022   Medicare Annual Wellness (AWV)  08/16/2022   INFLUENZA VACCINE  12/18/2022   HEMOGLOBIN A1C  01/30/2023   Diabetic kidney evaluation - eGFR measurement  07/25/2023   FOOT EXAM  07/30/2023   DTaP/Tdap/Td (3 - Td or Tdap) 03/07/2027   Pneumonia Vaccine 58+ Years old  Completed   HPV VACCINES  Aged Out    Health Maintenance  Health Maintenance Due  Topic Date Due   Zoster Vaccines- Shingrix (1 of 2) Never done   Diabetic kidney evaluation - Urine ACR  01/04/2022   OPHTHALMOLOGY EXAM  01/10/2022   COVID-19 Vaccine (4 - 2023-24 season) 01/17/2022   Medicare Annual Wellness (AWV)  08/16/2022    Colorectal cancer screening: No longer required.   Lung Cancer Screening: (Low Dose CT Chest recommended if Age 19-80 years, 30 pack-year currently smoking OR have quit w/in 15years.) does not qualify.    Additional Screening:  Hepatitis C Screening:  does not qualify  Vision Screening: Recommended annual ophthalmology exams for early detection of glaucoma and other disorders of the eye. Is the patient up to date with their annual eye exam?  Yes  Who is the provider or what is the name of the office in which the patient attends annual  eye exams? Dr.Cot[facility] If pt is not established with a provider, would they like to be referred to a provider to establish care? No .   Dental Screening: Recommended annual dental exams for proper oral hygiene  Community Resource Referral / Chronic Care Management: CRR required this visit?  No   CCM required this visit?  No      Plan:     I have personally reviewed and noted the following in the patient's chart:   Medical and social history Use of alcohol, tobacco or illicit drugs  Current medications and supplements including opioid prescriptions. Patient is not currently taking opioid prescriptions. Functional ability and status Nutritional status Physical activity Advanced directives List of other physicians Hospitalizations, surgeries, and ER visits in previous 12 months Vitals Screenings to include cognitive, depression, and falls Referrals and appointments  In addition, I have reviewed and discussed with patient certain preventive protocols, quality metrics, and best practice recommendations. A written personalized care plan for preventive services as well as general preventive health recommendations were provided to patient.     John Banister, MD   08/25/2022

## 2022-08-26 ENCOUNTER — Other Ambulatory Visit: Payer: Self-pay

## 2022-08-26 MED ORDER — TRUE METRIX BLOOD GLUCOSE TEST VI STRP: ORAL_STRIP | 1 refills | Status: DC

## 2022-09-10 ENCOUNTER — Other Ambulatory Visit: Payer: Self-pay | Admitting: Family Medicine

## 2022-09-16 ENCOUNTER — Telehealth: Payer: Self-pay | Admitting: Family Medicine

## 2022-09-16 NOTE — Telephone Encounter (Signed)
Chelsea  called from [facility] message to provider: Wearing O2 at all times , but he has not been likes to sit outside 4 to 5 hours tank only last 1 hour when patient goes out side, is it better portable O2 to suit the patient.[employer] is patient DME company. Check blood sugar readings around 113 now and about 2 months ago in the 60 went and went the the ER then. Patient skips lunch only eats breakfast and dinner. Patient takes 45 units morning and 35 units in the evening.

## 2022-09-16 NOTE — Telephone Encounter (Signed)
Sent in to [employer] for portable oxygen tank

## 2022-09-17 ENCOUNTER — Telehealth: Payer: Self-pay | Admitting: *Deleted

## 2022-09-17 NOTE — Telephone Encounter (Signed)
Patient called . He states that he is doing good, blood sugars are running in the 100's. He feels good. When ask if he was eating 3 well balanced meals a day , he said that he was. That was ehat he was doing at the time of my call, feeding hif face. Patient advised that if he experiences low blood sugars to call our office, that his medications may need to be adjusted. He agreed that he would do this.

## 2022-09-17 NOTE — Telephone Encounter (Signed)
Chelsey  patient's  Nurse case manger with [employer], left the following message. Patient was in the hospital about 2 months ago with low blood sugar of 60. At the time she talked with him o yesterday the blood sugar was 113. Patient told her that he only eats breakfast and dinner. He does not eat lunch, he would rather eat potato chips  Chelsey wanted to make Whitney aware. Patient's call back number is [phone]. His appointment is in September 2024.

## 2022-09-17 NOTE — Telephone Encounter (Signed)
Noted, no changes advised to meds at this time.  If he has recurrent hypoglycemia, will need to reduce.

## 2022-09-30 ENCOUNTER — Encounter: Payer: Self-pay | Admitting: Family Medicine

## 2022-09-30 ENCOUNTER — Ambulatory Visit (INDEPENDENT_AMBULATORY_CARE_PROVIDER_SITE_OTHER): Payer: Medicare HMO | Admitting: Family Medicine

## 2022-09-30 VITALS — BP 165/74 | HR 87 | Ht 70.0 in | Wt 262.1 lb

## 2022-09-30 DIAGNOSIS — E785 Hyperlipidemia, unspecified: Secondary | ICD-10-CM | POA: Diagnosis not present

## 2022-09-30 DIAGNOSIS — Z794 Long term (current) use of insulin: Secondary | ICD-10-CM

## 2022-09-30 DIAGNOSIS — Z0001 Encounter for general adult medical examination with abnormal findings: Secondary | ICD-10-CM | POA: Diagnosis not present

## 2022-09-30 DIAGNOSIS — E782 Mixed hyperlipidemia: Secondary | ICD-10-CM | POA: Diagnosis not present

## 2022-09-30 DIAGNOSIS — E11649 Type 2 diabetes mellitus with hypoglycemia without coma: Secondary | ICD-10-CM

## 2022-09-30 DIAGNOSIS — I10 Essential (primary) hypertension: Secondary | ICD-10-CM | POA: Diagnosis not present

## 2022-09-30 DIAGNOSIS — G8191 Hemiplegia, unspecified affecting right dominant side: Secondary | ICD-10-CM

## 2022-09-30 DIAGNOSIS — I509 Heart failure, unspecified: Secondary | ICD-10-CM | POA: Diagnosis not present

## 2022-09-30 DIAGNOSIS — L408 Other psoriasis: Secondary | ICD-10-CM | POA: Diagnosis not present

## 2022-09-30 MED ORDER — AMLODIPINE BESYLATE 5 MG PO TABS: 5.0000 mg | ORAL_TABLET | Freq: Every day | ORAL | 3 refills | Status: DC

## 2022-09-30 NOTE — Patient Instructions (Addendum)
F/U in 5 to 6 weeks re evaluate blood pressure, needs to bring all medications to next visit  New additional; tablet for your blood pressure is amlodipine 5 mg one every day  For metoprolol take 12 hrs apart, 10 am and 10 pm  Thyroid med take alone at 9 am one hour before the rest of your medication  Hepatic panel, lipid panel and Urine ACR today  Nurse pls send for and document eye exam this year  Dr Charise Killian  Please assist pt getting up out of chair, has difficulty  Nurse please request lift chair for patient  I will send cream for your skin  Thanks for choosing [facility], we consider it a privelige to serve you.

## 2022-09-30 NOTE — Progress Notes (Signed)
   [name]     MRN: [id number]      DOB: [birth year]  Chief Complaint  Patient presents with   Annual Exam    HPI: Patient is in for annual physical exam. Requests lift chhair, has extreme difficulty getting up from sitting, falls back about 3 times per effort in standing Recent labs,  are reviewed. Immunization is reviewed .    PE; BP (!) 165/74   Pulse 87   Ht 5\' 10"  (1.778 m)   Wt 262 lb 1.9 oz (118.9 kg)   SpO2 90%   BMI 37.61 kg/m   Pleasant male, alert and oriented x 3, in no cardio-pulmonary distress. Afebrile. HEENT No facial trauma or asymetry. Sinuses non tender. EOMI External ears normal,  Neck: decreased ROM, no adenopathy,JVD or thyromegaly.No bruits.  Chest: Decreased air entry, scattered wheezes, no crackles Non tender to palpation  Cardiovascular system; Heart sounds normal,  S1 and  S2 ,no S3.  No murmur, or thrill. Peripheral pulses decreased.  Abdomen: Soft, non tender  Musculoskeletal exam: Markedly decreased  ROM of spine, hips , shoulders and knees.  deformity ,swelling or crepitus noted.  muscle wasting and  atrophy. In all major muscles  Neurologic: Cranial nerves 2 to 12 intact. Power  and  tone ,decreased throughout, sensation  normal throughout. Marked  disturbance in gait. Incapable of walking without asistive device. Muscles of trunk markedly weak. No tremor.  Skin: Intact, , scaling rash noted on shins, knees and elbows Pigmentation normal throughout  Psych; Normal mood and affect.   Assessment & Plan:  Encounter for Medicare annual examination with abnormal findings Annual exam as documented. Immunization and cancer screening needs are specifically addressed at this visit.   Right hemiparesis (HCC) Generalized weakness , with reduced power and tone in truncal and major muscle groups, unable to rise from standing without assistance , has significant weakness in core muscles , lift chair necessary for improved  quality of life and safety  PSORIASIS Steroid ointment prescribed for topical use

## 2022-10-02 LAB — MICROALBUMIN / CREATININE URINE RATIO
Creatinine, Urine: 82.6 mg/dL
Microalb/Creat Ratio: 101 mg/g creat — ABNORMAL HIGH (ref 0–29)
Microalbumin, Urine: 83.7 ug/mL

## 2022-10-02 LAB — LIPID PANEL
Chol/HDL Ratio: 4.3 ratio (ref 0.0–5.0)
Cholesterol, Total: 129 mg/dL (ref 100–199)
HDL: 30 mg/dL — ABNORMAL LOW (ref >39)
LDL Chol Calc (NIH): 72 mg/dL (ref 0–99)
Triglycerides: 155 mg/dL — ABNORMAL HIGH (ref 0–149)
VLDL Cholesterol Cal: 27 mg/dL (ref 5–40)

## 2022-10-02 LAB — HEPATIC FUNCTION PANEL
ALT: 8 IU/L (ref 0–44)
AST: 13 IU/L (ref 0–40)
Albumin: 4.2 g/dL (ref 3.8–4.8)
Alkaline Phosphatase: 83 IU/L (ref 44–121)
Bilirubin Total: 0.3 mg/dL (ref 0.0–1.2)
Bilirubin, Direct: 0.11 mg/dL (ref 0.00–0.40)
Total Protein: 7.4 g/dL (ref 6.0–8.5)

## 2022-10-05 ENCOUNTER — Encounter: Payer: Self-pay | Admitting: Family Medicine

## 2022-10-05 DIAGNOSIS — Z0001 Encounter for general adult medical examination with abnormal findings: Secondary | ICD-10-CM | POA: Insufficient documentation

## 2022-10-05 MED ORDER — CLOBETASOL PROPIONATE 0.05 % EX OINT: TOPICAL_OINTMENT | CUTANEOUS | 1 refills | Status: AC

## 2022-10-05 NOTE — Assessment & Plan Note (Addendum)
Generalized weakness , with reduced power and tone in truncal and major muscle groups, unable to rise from standing without assistance , has significant weakness in core muscles , lift chair necessary for improved quality of life and safety

## 2022-10-05 NOTE — Assessment & Plan Note (Signed)
Steroid ointment prescribed for topical use

## 2022-10-05 NOTE — Assessment & Plan Note (Signed)
Hyperlipidemia:Low fat diet discussed and encouraged.   Lipid Panel  Lab Results  Component Value Date   CHOL 129 09/30/2022   HDL 30 (L) 09/30/2022   LDLCALC 72 09/30/2022   LDLDIRECT 50 08/18/2014   TRIG 155 (H) 09/30/2022   CHOLHDL 4.3 09/30/2022

## 2022-10-05 NOTE — Assessment & Plan Note (Signed)
[name] is reminded of the importance of commitment to daily physical activity for 30 minutes or more, as able and the need to limit carbohydrate intake to 30 to 60 grams per meal to help with blood sugar control.   The need to take medication as prescribed, test blood sugar as directed, and to call between visits if there is a concern that blood sugar is uncontrolled is also discussed.   [name] is reminded of the importance of daily foot exam, annual eye examination, and good blood sugar, blood pressure and cholesterol control.     Latest Ref Rng & Units 09/30/2022   11:10 AM 07/30/2022   10:12 AM 07/25/2022    9:51 AM 07/19/2022   12:28 AM 07/19/2022   12:22 AM  Diabetic Labs  HbA1c 4.0 - 5.6 %  6.0      Micro/Creat Ratio 0 - 29 mg/g creat 101       Chol 100 - 199 mg/dL 914       HDL >78 mg/dL 30       Calc LDL 0 - 99 mg/dL 72       Triglycerides 0 - 149 mg/dL 295       Creatinine 6.21 - 1.27 mg/dL   3.08  6.57  8.46       09/30/2022   10:14 AM 09/30/2022   10:13 AM 07/30/2022   10:01 AM 07/30/2022    9:54 AM 07/29/2022    9:52 AM 07/19/2022    6:30 AM 07/19/2022    4:30 AM  BP/Weight  Systolic BP 165 179 140 148 171 181 171  Diastolic BP 74 82 70 69 67 90 90  Wt. (Lbs)  262.12  266 236    BMI  37.61 kg/m2  38.17 kg/m2 33.86 kg/m2        07/30/2022   10:00 AM 06/20/2021    8:00 AM  Foot/eye exam completion dates  Foot Form Completion Done Done      Managed by Endo, most recent HBA1C is 6.0

## 2022-10-05 NOTE — Assessment & Plan Note (Signed)
Annual exam as documented. . Immunization and cancer screening needs are specifically addressed at this visit.  

## 2022-10-06 DIAGNOSIS — E119 Type 2 diabetes mellitus without complications: Secondary | ICD-10-CM | POA: Diagnosis not present

## 2022-10-06 DIAGNOSIS — E782 Mixed hyperlipidemia: Secondary | ICD-10-CM | POA: Diagnosis not present

## 2022-10-06 DIAGNOSIS — I10 Essential (primary) hypertension: Secondary | ICD-10-CM | POA: Diagnosis not present

## 2022-10-06 DIAGNOSIS — I25118 Atherosclerotic heart disease of native coronary artery with other forms of angina pectoris: Secondary | ICD-10-CM | POA: Diagnosis not present

## 2022-11-14 ENCOUNTER — Ambulatory Visit (INDEPENDENT_AMBULATORY_CARE_PROVIDER_SITE_OTHER): Payer: Medicare HMO | Admitting: Family Medicine

## 2022-11-14 ENCOUNTER — Encounter: Payer: Self-pay | Admitting: Family Medicine

## 2022-11-14 VITALS — BP 144/72 | HR 73 | Ht 70.0 in | Wt 258.1 lb

## 2022-11-14 DIAGNOSIS — E785 Hyperlipidemia, unspecified: Secondary | ICD-10-CM | POA: Diagnosis not present

## 2022-11-14 DIAGNOSIS — I10 Essential (primary) hypertension: Secondary | ICD-10-CM

## 2022-11-14 DIAGNOSIS — Z9181 History of falling: Secondary | ICD-10-CM

## 2022-11-14 DIAGNOSIS — E11649 Type 2 diabetes mellitus with hypoglycemia without coma: Secondary | ICD-10-CM

## 2022-11-14 DIAGNOSIS — E559 Vitamin D deficiency, unspecified: Secondary | ICD-10-CM

## 2022-11-14 DIAGNOSIS — Z794 Long term (current) use of insulin: Secondary | ICD-10-CM

## 2022-11-14 DIAGNOSIS — E038 Other specified hypothyroidism: Secondary | ICD-10-CM

## 2022-11-14 NOTE — Patient Instructions (Addendum)
F/U in mid to end September, call if you need me sooner  Chem 7 and Egfr today and vit D level  Need shingles vaccine and eye exam we will refer you to Dr Charise Killian, please go  Will advise on spironolactone 25 mg tablet dose when labs are reviewed,   Nurse please f/u on lift chair and communicate with patient  Thanks for choosing [facility], we consider it a privelige to serve you.

## 2022-11-15 ENCOUNTER — Emergency Department (HOSPITAL_COMMUNITY): Payer: Medicare HMO

## 2022-11-15 ENCOUNTER — Encounter: Payer: Self-pay | Admitting: Family Medicine

## 2022-11-15 ENCOUNTER — Encounter (HOSPITAL_COMMUNITY): Payer: Self-pay | Admitting: Emergency Medicine

## 2022-11-15 ENCOUNTER — Other Ambulatory Visit: Payer: Self-pay

## 2022-11-15 ENCOUNTER — Emergency Department (HOSPITAL_COMMUNITY)
Admission: EM | Admit: 2022-11-15 | Discharge: 2022-11-15 | Disposition: A | Discharge status: Home / Self Care | Payer: Medicare HMO | Attending: Emergency Medicine | Admitting: Emergency Medicine

## 2022-11-15 DIAGNOSIS — Z79899 Other long term (current) drug therapy: Secondary | ICD-10-CM | POA: Diagnosis not present

## 2022-11-15 DIAGNOSIS — Z7902 Long term (current) use of antithrombotics/antiplatelets: Secondary | ICD-10-CM | POA: Diagnosis not present

## 2022-11-15 DIAGNOSIS — I509 Heart failure, unspecified: Secondary | ICD-10-CM | POA: Diagnosis not present

## 2022-11-15 DIAGNOSIS — E119 Type 2 diabetes mellitus without complications: Secondary | ICD-10-CM | POA: Diagnosis not present

## 2022-11-15 DIAGNOSIS — I110 Hypertensive heart disease with heart failure: Secondary | ICD-10-CM | POA: Diagnosis not present

## 2022-11-15 DIAGNOSIS — I10 Essential (primary) hypertension: Secondary | ICD-10-CM | POA: Diagnosis not present

## 2022-11-15 DIAGNOSIS — R197 Diarrhea, unspecified: Secondary | ICD-10-CM | POA: Diagnosis not present

## 2022-11-15 DIAGNOSIS — N281 Cyst of kidney, acquired: Secondary | ICD-10-CM | POA: Diagnosis not present

## 2022-11-15 DIAGNOSIS — I2510 Atherosclerotic heart disease of native coronary artery without angina pectoris: Secondary | ICD-10-CM | POA: Insufficient documentation

## 2022-11-15 DIAGNOSIS — Z7982 Long term (current) use of aspirin: Secondary | ICD-10-CM | POA: Insufficient documentation

## 2022-11-15 DIAGNOSIS — E039 Hypothyroidism, unspecified: Secondary | ICD-10-CM | POA: Insufficient documentation

## 2022-11-15 DIAGNOSIS — N3000 Acute cystitis without hematuria: Secondary | ICD-10-CM | POA: Insufficient documentation

## 2022-11-15 DIAGNOSIS — Z794 Long term (current) use of insulin: Secondary | ICD-10-CM | POA: Insufficient documentation

## 2022-11-15 DIAGNOSIS — R109 Unspecified abdominal pain: Secondary | ICD-10-CM | POA: Diagnosis not present

## 2022-11-15 DIAGNOSIS — J449 Chronic obstructive pulmonary disease, unspecified: Secondary | ICD-10-CM | POA: Insufficient documentation

## 2022-11-15 LAB — BMP8+EGFR
BUN/Creatinine Ratio: 13 (ref 10–24)
BUN: 18 mg/dL (ref 8–27)
CO2: 22 mmol/L (ref 20–29)
Calcium: 9.5 mg/dL (ref 8.6–10.2)
Chloride: 101 mmol/L (ref 96–106)
Creatinine, Ser: 1.35 mg/dL — ABNORMAL HIGH (ref 0.76–1.27)
Glucose: 91 mg/dL (ref 70–99)
Potassium: 4.2 mmol/L (ref 3.5–5.2)
Sodium: 138 mmol/L (ref 134–144)
eGFR: 53 mL/min/{1.73_m2} — ABNORMAL LOW (ref >59)

## 2022-11-15 LAB — COMPREHENSIVE METABOLIC PANEL
ALT: 10 U/L (ref 0–44)
AST: 14 U/L — ABNORMAL LOW (ref 15–41)
Albumin: 3.3 g/dL — ABNORMAL LOW (ref 3.5–5.0)
Alkaline Phosphatase: 57 U/L (ref 38–126)
Anion gap: 8 (ref 5–15)
BUN: 19 mg/dL (ref 8–23)
CO2: 24 mmol/L (ref 22–32)
Calcium: 8.7 mg/dL — ABNORMAL LOW (ref 8.9–10.3)
Chloride: 103 mmol/L (ref 98–111)
Creatinine, Ser: 1.35 mg/dL — ABNORMAL HIGH (ref 0.61–1.24)
GFR, Estimated: 53 mL/min — ABNORMAL LOW (ref >60)
Glucose, Bld: 116 mg/dL — ABNORMAL HIGH (ref 70–99)
Potassium: 3.6 mmol/L (ref 3.5–5.1)
Sodium: 135 mmol/L (ref 135–145)
Total Bilirubin: 0.5 mg/dL (ref 0.3–1.2)
Total Protein: 7.6 g/dL (ref 6.5–8.1)

## 2022-11-15 LAB — CBC WITH DIFFERENTIAL/PLATELET
Abs Immature Granulocytes: 0.04 10*3/uL (ref 0.00–0.07)
Basophils Absolute: 0.1 10*3/uL (ref 0.0–0.1)
Basophils Relative: 1 %
Eosinophils Absolute: 0.2 10*3/uL (ref 0.0–0.5)
Eosinophils Relative: 1 %
HCT: 34 % — ABNORMAL LOW (ref 39.0–52.0)
Hemoglobin: 10.7 g/dL — ABNORMAL LOW (ref 13.0–17.0)
Immature Granulocytes: 0 %
Lymphocytes Relative: 25 %
Lymphs Abs: 3 10*3/uL (ref 0.7–4.0)
MCH: 28.1 pg (ref 26.0–34.0)
MCHC: 31.5 g/dL (ref 30.0–36.0)
MCV: 89.2 fL (ref 80.0–100.0)
Monocytes Absolute: 0.9 10*3/uL (ref 0.1–1.0)
Monocytes Relative: 8 %
Neutro Abs: 7.7 10*3/uL (ref 1.7–7.7)
Neutrophils Relative %: 65 %
Platelets: 258 10*3/uL (ref 150–400)
RBC: 3.81 MIL/uL — ABNORMAL LOW (ref 4.22–5.81)
RDW: 15.2 % (ref 11.5–15.5)
WBC: 12 10*3/uL — ABNORMAL HIGH (ref 4.0–10.5)
nRBC: 0 % (ref 0.0–0.2)

## 2022-11-15 LAB — URINALYSIS, ROUTINE W REFLEX MICROSCOPIC
Bilirubin Urine: NEGATIVE
Glucose, UA: NEGATIVE mg/dL
Ketones, ur: NEGATIVE mg/dL
Nitrite: POSITIVE — AB
Protein, ur: 30 mg/dL — AB
Specific Gravity, Urine: 1.032 — ABNORMAL HIGH (ref 1.005–1.030)
WBC, UA: >50 WBC/hpf (ref 0–5)
pH: 5 (ref 5.0–8.0)

## 2022-11-15 LAB — LIPASE, BLOOD: Lipase: 30 U/L (ref 11–51)

## 2022-11-15 LAB — VITAMIN D 25 HYDROXY (VIT D DEFICIENCY, FRACTURES): Vit D, 25-Hydroxy: 22 ng/mL — ABNORMAL LOW (ref 30.0–100.0)

## 2022-11-15 MED ORDER — CIPROFLOXACIN HCL 500 MG PO TABS: 500.0000 mg | ORAL_TABLET | Freq: Two times a day (BID) | ORAL | 0 refills | Status: DC

## 2022-11-15 MED ORDER — IOHEXOL 300 MG/ML  SOLN
100.0000 mL | Freq: Once | INTRAMUSCULAR | Status: AC | PRN
  Administered 2022-11-15: 100 mL via INTRAVENOUS

## 2022-11-15 MED ORDER — SODIUM CHLORIDE 0.9 % IV BOLUS
500.0000 mL | Freq: Once | INTRAVENOUS | Status: AC
  Administered 2022-11-15: 500 mL via INTRAVENOUS

## 2022-11-15 MED ORDER — VITAMIN D (ERGOCALCIFEROL) 1.25 MG (50000 UNIT) PO CAPS: 50000.0000 [IU] | ORAL_CAPSULE | ORAL | 2 refills | Status: DC

## 2022-11-15 MED ORDER — METRONIDAZOLE 500 MG PO TABS
500.0000 mg | ORAL_TABLET | Freq: Once | ORAL | Status: AC
  Administered 2022-11-15: 500 mg via ORAL
  Filled 2022-11-15: qty 1

## 2022-11-15 MED ORDER — CIPROFLOXACIN HCL 250 MG PO TABS
500.0000 mg | ORAL_TABLET | Freq: Once | ORAL | Status: AC
  Administered 2022-11-15: 500 mg via ORAL
  Filled 2022-11-15: qty 2

## 2022-11-15 MED ORDER — METRONIDAZOLE 500 MG PO TABS: 500.0000 mg | ORAL_TABLET | Freq: Two times a day (BID) | ORAL | 0 refills | Status: DC

## 2022-11-15 MED ORDER — SPIRONOLACTONE 50 MG PO TABS: ORAL_TABLET | ORAL | 3 refills | Status: DC

## 2022-11-15 MED ORDER — ALIGN 4 MG PO CAPS: 1.0000 | ORAL_CAPSULE | Freq: Every day | ORAL | 0 refills | Status: AC

## 2022-11-15 NOTE — Assessment & Plan Note (Signed)
Managed by Endo, controlled  

## 2022-11-15 NOTE — ED Notes (Signed)
Patient did not inform that he wears continuous O2 at home at 2L. Upon arrival without any. Placed patient on O2 in room.

## 2022-11-15 NOTE — Progress Notes (Signed)
[name]     MRN: [id number]      DOB: 08-31-1940  Chief Complaint  Patient presents with   Follow-up    Follow up lift chair    HPI [name] is here for follow up and re-evaluation of chronic medical conditions, medication management and review of any available recent lab and radiology data.  Preventive health is updated, specifically  Cancer screening and Immunization.   Questions or concerns regarding consultations or procedures which the PT has had in the interim are  addressed. The PT denies any adverse reactions to current medications since the last visit.  Still not heard anything about his lift chair, on questioning , this is not covered by his insurance Has seen cardiology since last visit, is taking additional spironolactone but not at dose prescribed will see labs Denies polyuria, polydipsia, blurred vision , or hypoglycemic episodes.   ROS Denies recent fever or chills. Denies sinus pressure, nasal congestion, ear pain or sore throat. Denies chest congestion, productive cough or wheezing. Denies chest pains, palpitations and leg swelling Acute diarrhea one day ago, thinks he ate stale fish, no nausea, wife not affected, has resolved  Denies dysuria, frequency, hesitancy or incontinence. Chronic  joint pain,  and limitation in mobility. Denies headaches, seizures, numbness, or tingling. Denies depression, anxiety or insomnia. Denies skin break down or rash.   PE  BP (!) 144/72   Pulse 73   Ht 5\' 10"  (1.778 m)   Wt 258 lb 1.9 oz (117.1 kg)   SpO2 90%   BMI 37.04 kg/m   Patient alert and oriented and in no cardiopulmonary distress.  HEENT: No facial asymmetry, EOMI,     Neck supple .  Chest: Clear to auscultation bilaterally.decreased air entry  CVS: S1, S2 no murmurs, no S3.Regular rate.  ABD: Soft non tender.   Ext: No edema  MS: decreased ROM spine, shoulders, hips and knees.  Skin: Intact, no ulcerations or rash noted.  Psych: Good  eye contact, normal affect. Memory intact not anxious or depressed appearing.  CNS: CN 2-12 intact, right hemiparesis.   Assessment & Plan  Essential hypertension Improved and at goal for age  DASH diet and commitment to daily physical activity for a minimum of 30 minutes discussed and encouraged, as a part of hypertension management. The importance of attaining a healthy weight is also discussed.     11/14/2022   10:07 AM 11/14/2022    9:53 AM 11/14/2022    9:49 AM 09/30/2022   10:14 AM 09/30/2022   10:13 AM 07/30/2022   10:01 AM 07/30/2022    9:54 AM  BP/Weight  Systolic BP 144 145 155 165 179 140 148  Diastolic BP 72 72 77 74 82 70 69  Wt. (Lbs)   258.12  262.12  266  BMI   37.04 kg/m2  37.61 kg/m2  38.17 kg/m2       At moderate risk for fall Home safety reviewed , will f/u with pt regarding lift chair  Hyperlipidemia LDL goal <70 Hyperlipidemia:Low fat diet discussed and encouraged.   Lipid Panel  Lab Results  Component Value Date   CHOL 129 09/30/2022   HDL 30 (L) 09/30/2022   LDLCALC 72 09/30/2022   LDLDIRECT 50 08/18/2014   TRIG 155 (H) 09/30/2022   CHOLHDL 4.3 09/30/2022       Hypothyroidism Managed by Endo, controlled  Type 2 diabetes mellitus with hypoglycemia (HCC) Better controlled on lower dose insulin [name] is reminded  of the importance of commitment to daily physical activity for 30 minutes or more, as able and the need to limit carbohydrate intake to 30 to 60 grams per meal to help with blood sugar control.   The need to take medication as prescribed, test blood sugar as directed, and to call between visits if there is a concern that blood sugar is uncontrolled is also discussed.   [name] is reminded of the importance of daily foot exam, annual eye examination, and good blood sugar, blood pressure and cholesterol control.     Latest Ref Rng & Units 09/30/2022   11:10 AM 07/30/2022   10:12 AM 07/25/2022    9:51 AM 07/19/2022   12:28  AM 07/19/2022   12:22 AM  Diabetic Labs  HbA1c 4.0 - 5.6 %  6.0      Micro/Creat Ratio 0 - 29 mg/g creat 101       Chol 100 - 199 mg/dL 811       HDL >91 mg/dL 30       Calc LDL 0 - 99 mg/dL 72       Triglycerides 0 - 149 mg/dL 478       Creatinine 2.95 - 1.27 mg/dL   6.21  3.08  6.57       11/14/2022   10:07 AM 11/14/2022    9:53 AM 11/14/2022    9:49 AM 09/30/2022   10:14 AM 09/30/2022   10:13 AM 07/30/2022   10:01 AM 07/30/2022    9:54 AM  BP/Weight  Systolic BP 144 145 155 165 179 140 148  Diastolic BP 72 72 77 74 82 70 69  Wt. (Lbs)   258.12  262.12  266  BMI   37.04 kg/m2  37.61 kg/m2  38.17 kg/m2      07/30/2022   10:00 AM 06/20/2021    8:00 AM  Foot/eye exam completion dates  Foot Form Completion Done Done      Managed by Endo  Vitamin d deficiency Updated lab needed at/ before next visit.

## 2022-11-15 NOTE — Assessment & Plan Note (Signed)
Updated lab needed at/ before next visit.   

## 2022-11-15 NOTE — ED Triage Notes (Signed)
Pt to triage with c/o diarrhea that started 4 days ago.  Pt states now having lower abdominal pain.  States diarrhea is watery.

## 2022-11-15 NOTE — Addendum Note (Signed)
Addended by: Kerri Perches on: 11/15/2022 07:02 AM   Modules accepted: Orders

## 2022-11-15 NOTE — Assessment & Plan Note (Signed)
Improved and at goal for age  DASH diet and commitment to daily physical activity for a minimum of 30 minutes discussed and encouraged, as a part of hypertension management. The importance of attaining a healthy weight is also discussed.     11/14/2022   10:07 AM 11/14/2022    9:53 AM 11/14/2022    9:49 AM 09/30/2022   10:14 AM 09/30/2022   10:13 AM 07/30/2022   10:01 AM 07/30/2022    9:54 AM  BP/Weight  Systolic BP 144 145 155 165 179 140 148  Diastolic BP 72 72 77 74 82 70 69  Wt. (Lbs)   258.12  262.12  266  BMI   37.04 kg/m2  37.61 kg/m2  38.17 kg/m2

## 2022-11-15 NOTE — Assessment & Plan Note (Signed)
Hyperlipidemia:Low fat diet discussed and encouraged.   Lipid Panel  Lab Results  Component Value Date   CHOL 129 09/30/2022   HDL 30 (L) 09/30/2022   LDLCALC 72 09/30/2022   LDLDIRECT 50 08/18/2014   TRIG 155 (H) 09/30/2022   CHOLHDL 4.3 09/30/2022      

## 2022-11-15 NOTE — ED Provider Notes (Signed)
[place] [facility] EMERGENCY DEPARTMENT AT [facility] Provider Note   CSN: [id number] Arrival date & time: 11/15/22  1731     History  Chief Complaint  Patient presents with   Abdominal Pain    [name] is a 82 y.o. male.  Pt is a 82 yo male with pmhx with GERD, CAD, Obesity, HLD, HTN, Gout, COPD (on chronic O2), CVA, arthritis, DM2, chronic back pain, CHF, and hypothyroidism.  Pt reports diarrhea for 4 days.  He has not control over his bm.  He does have some associated lower abd pain. He saw his pcp yesterday, but did not mention the diarrhea. No n/v. No fevers.  No recent travel or abx.       Home Medications Prior to Admission medications   Medication Sig Start Date End Date Taking? Authorizing Provider  acetaminophen (TYLENOL) 500 MG tablet Take 1,000 mg by mouth every 6 (six) hours as needed for moderate pain (left thigh).    Yes [provider]  albuterol (VENTOLIN HFA) 108 (90 Base) MCG/ACT inhaler INHALE 1 TO 2 PUFFS EVERY 6 HOURS AS NEEDED FOR WHEEZING OR SHORTNESS OF BREATH Patient taking differently: Inhale 2 puffs into the lungs every 6 (six) hours as needed for shortness of breath or wheezing. 07/30/22  Yes Dani Gobble, NP  allopurinol (ZYLOPRIM) 300 MG tablet TAKE 1 TABLET EVERY DAY 08/01/22  Yes Kerri Perches, MD  aspirin EC 81 MG tablet Take 81 mg by mouth daily.   Yes [provider]  ciprofloxacin (CIPRO) 500 MG tablet Take 1 tablet (500 mg total) by mouth 2 (two) times daily. 11/15/22  Yes Jacalyn Lefevre, MD  clobetasol ointment (TEMOVATE) 0.05 % Apply twice daily to affected areas for 14 days then once daily, as needed 10/05/22  Yes Kerri Perches, MD  clopidogrel (PLAVIX) 75 MG tablet TAKE 1 TABLET EVERY DAY 06/05/22  Yes Kerri Perches, MD  ezetimibe (ZETIA) 10 MG tablet TAKE 1 TABLET EVERY DAY 03/05/22  Yes Kerri Perches, MD  insulin NPH-regular Human (NOVOLIN 70/30) (70-30) 100 UNIT/ML injection Inject  35-45 Units into the skin 2 (two) times daily with a meal. Take 45 units with breakfast and 35 units with supper if glucose is above 90 and eating. 07/30/22  Yes Dani Gobble, NP  isosorbide mononitrate (IMDUR) 30 MG 24 hr tablet TAKE 1 TABLET EVERY DAY 09/10/22  Yes Kerri Perches, MD  levETIRAcetam (KEPPRA) 500 MG tablet TAKE 1 TABLET TWICE DAILY 03/05/22  Yes Kerri Perches, MD  levothyroxine (SYNTHROID) 112 MCG tablet TAKE 1 TABLET EVERY DAY 05/23/22  Yes Dani Gobble, NP  metoprolol tartrate (LOPRESSOR) 50 MG tablet TAKE 1/2 TABLET TWICE DAILY 08/01/22  Yes Kerri Perches, MD  metroNIDAZOLE (FLAGYL) 500 MG tablet Take 1 tablet (500 mg total) by mouth 2 (two) times daily. 11/15/22  Yes Jacalyn Lefevre, MD  nitroGLYCERIN (NITROSTAT) 0.4 MG SL tablet Place 1 tablet (0.4 mg total) under the tongue every 5 (five) minutes as needed for chest pain. 05/08/17 11/15/22 Yes Rinaldo Cloud, MD  pantoprazole (PROTONIX) 40 MG tablet TAKE 1 TABLET EVERY DAY BEFORE BREAKFAST Patient taking differently: Take 40 mg by mouth daily. 03/05/22  Yes Kerri Perches, MD  polyethylene glycol (MIRALAX / GLYCOLAX) 17 g packet Take 17 g by mouth daily as needed. 09/04/19  Yes [place], Eric J, DO  pravastatin (PRAVACHOL) 40 MG tablet TAKE 2 TABLETS EVERY DAY 03/05/22  Yes Kerri Perches, MD  Probiotic Product (ALIGN) 4 MG CAPS Take 1 capsule (4 mg total) by mouth daily. 11/15/22  Yes Jacalyn Lefevre, MD  spironolactone (ALDACTONE) 50 MG tablet TAKE 1 TABLET EVERY DAY (DOSE INCREASE) 11/15/22  Yes Kerri Perches, MD  UNABLE TO FIND CPAP mask and tubing  DX G47.33 06/11/21  Yes Kerri Perches, MD  Blood Glucose Calibration (TRUE METRIX LEVEL 1) Low SOLN Use as directed 03/09/20   Kerri Perches, MD  Blood Glucose Monitoring Suppl (TRUE METRIX METER) w/Device KIT USE AS DIRECTED 09/18/20   Kerri Perches, MD  DROPLET INSULIN SYRINGE 31G X 5/16" 1 ML MISC USE AS DIRECTED IN THE  MORNING AND AT BEDTIME 06/11/20   Kerri Perches, MD  glucose blood (TRUE METRIX BLOOD GLUCOSE TEST) test strip 4 times daily testing dx e11.65 08/26/22   Kerri Perches, MD  spironolactone (ALDACTONE) 25 MG tablet Take 12.5 mg by mouth daily. Take half a tablet by mouth daily Patient not taking: Reported on 11/15/2022    [provider]  TRUEplus Lancets 33G MISC TEST BLOOD SUGAR FOUR TIMES DAILY 01/06/22   Kerri Perches, MD  Vitamin D, Ergocalciferol, (DRISDOL) 1.25 MG (50000 UNIT) CAPS capsule Take 1 capsule (50,000 Units total) by mouth every 7 (seven) days. 11/17/22   Kerri Perches, MD      Allergies    Patient has no known allergies.    Review of Systems   Review of Systems  Gastrointestinal:  Positive for abdominal pain and diarrhea.  All other systems reviewed and are negative.   Physical Exam Updated Vital Signs BP 139/72   Pulse 67   Temp 98.1 F (36.7 C) (Oral)   Resp (!) 21   Ht 5\' 11"  (1.803 m)   Wt 117 kg   SpO2 95%   BMI 35.98 kg/m  Physical Exam Vitals and nursing note reviewed.  Constitutional:      Appearance: He is well-developed. He is obese.  HENT:     Head: Normocephalic and atraumatic.     Mouth/Throat:     Mouth: Mucous membranes are moist.     Pharynx: Oropharynx is clear.  Eyes:     Extraocular Movements: Extraocular movements intact.     Pupils: Pupils are equal, round, and reactive to light.  Cardiovascular:     Rate and Rhythm: Normal rate and regular rhythm.     Heart sounds: Normal heart sounds.  Pulmonary:     Effort: Pulmonary effort is normal.     Breath sounds: Normal breath sounds.  Abdominal:     General: Abdomen is flat. Bowel sounds are normal.     Palpations: Abdomen is soft.     Tenderness: There is abdominal tenderness in the left lower quadrant.  Skin:    General: Skin is warm.     Capillary Refill: Capillary refill takes less than 2 seconds.  Neurological:     General: No focal deficit present.      Mental Status: He is alert and oriented to person, place, and time.  Psychiatric:        Mood and Affect: Mood normal.        Behavior: Behavior normal.     ED Results / Procedures / Treatments   Labs (all labs ordered are listed, but only abnormal results are displayed) Labs Reviewed  CBC WITH DIFFERENTIAL/PLATELET - Abnormal; Notable for the following components:      Result Value   WBC 12.0 (*)  RBC 3.81 (*)    Hemoglobin 10.7 (*)    HCT 34.0 (*)    All other components within normal limits  COMPREHENSIVE METABOLIC PANEL - Abnormal; Notable for the following components:   Glucose, Bld 116 (*)    Creatinine, Ser 1.35 (*)    Calcium 8.7 (*)    Albumin 3.3 (*)    AST 14 (*)    GFR, Estimated 53 (*)    All other components within normal limits  URINALYSIS, ROUTINE W REFLEX MICROSCOPIC - Abnormal; Notable for the following components:   APPearance HAZY (*)    Specific Gravity, Urine 1.032 (*)    Hgb urine dipstick SMALL (*)    Protein, ur 30 (*)    Nitrite POSITIVE (*)    Leukocytes,Ua LARGE (*)    Bacteria, UA MANY (*)    All other components within normal limits  GASTROINTESTINAL PANEL BY PCR, STOOL (REPLACES STOOL CULTURE)  C DIFFICILE QUICK SCREEN W PCR REFLEX    LIPASE, BLOOD    EKG EKG Interpretation Date/Time:  Saturday November 15 2022 18:36:23 EDT Ventricular Rate:  70 PR Interval:  280 QRS Duration:  143 QT Interval:  430 QTC Calculation: 464 R Axis:   1  Text Interpretation: Sinus rhythm Prolonged PR interval Right bundle branch block No significant change since last tracing Confirmed by Jacalyn Lefevre [id number]) on 11/15/2022 8:04:29 PM  Radiology CT ABDOMEN PELVIS W CONTRAST  Result Date: 11/15/2022 CLINICAL DATA:  Abdominal pain, acute, nonlocalized diarrhea. EXAM: CT ABDOMEN AND PELVIS WITH CONTRAST TECHNIQUE: Multidetector CT imaging of the abdomen and pelvis was performed using the standard protocol following bolus administration of intravenous  contrast. RADIATION DOSE REDUCTION: This exam was performed according to the departmental dose-optimization program which includes automated exposure control, adjustment of the mA and/or kV according to patient size and/or use of iterative reconstruction technique. CONTRAST:  OMNIPAQUE IOHEXOL 300 MG/ML  SOLN COMPARISON:  CT of the abdomen and pelvis 07/19/2022 FINDINGS: Lower chest: Chronic scarring is again noted both lung bases. Lungs are otherwise clear. Heart size is normal. Coronary artery calcifications are present. No significant pleural or pericardial effusion is present. Hepatobiliary: No focal liver abnormality is seen. No gallstones, gallbladder wall thickening, or biliary dilatation. Pancreas: Unremarkable. No pancreatic ductal dilatation or surrounding inflammatory changes. Spleen: Normal in size without focal abnormality. Adrenals/Urinary Tract: Adrenal glands are normal bilaterally. The 5.3 cm simple cyst at the upper pole of the right kidney is stable. A 1.3 cm simple cyst in the midportion of the left kidney is stable. No other renal lesions are present. No stone or obstruction is present. The ureters are within normal limits bilaterally. The urinary bladder is normal. Stomach/Bowel: The stomach and duodenum are within normal limits. Small bowel is unremarkable. Terminal ileum is within normal limits. The appendix is surgically absent. The ascending and transverse colon are within normal limits. The descending and sigmoid colon are normal. Vascular/Lymphatic: Atherosclerotic calcifications are present in the aorta and branch vessels, stable. No aneurysm is present. No significant adenopathy is present. Reproductive: Prostate is unremarkable. Other: A paraumbilical hernia contains fat without bowel. No other significant ventral hernias are present. No free air free fluid is present. Musculoskeletal: Multilevel degenerative changes are again noted within the lumbar spine. No focal osseous  lesions are present. The hips are located. IMPRESSION: 1. No acute or focal lesion to explain the patient's symptoms. 2. Stable simple cysts of the kidneys bilaterally. Recommend no imaging follow-up. 3. Coronary artery disease. 4.  Multilevel degenerative changes in the lumbar spine. 5.  Aortic Atherosclerosis (ICD10-I70.0). Electronically Signed   By: Marin Roberts M.D.   On: 11/15/2022 19:45    Procedures Procedures    Medications Ordered in ED Medications  ciprofloxacin (CIPRO) tablet 500 mg (has no administration in time range)  metroNIDAZOLE (FLAGYL) tablet 500 mg (has no administration in time range)  sodium chloride 0.9 % bolus 500 mL (500 mLs Intravenous New Bag/Given 11/15/22 1834)  iohexol (OMNIPAQUE) 300 MG/ML solution 100 mL (100 mLs Intravenous Contrast Given 11/15/22 1842)    ED Course/ Medical Decision Making/ A&P                             Medical Decision Making Amount and/or Complexity of Data Reviewed Labs: ordered. Radiology: ordered.  Risk OTC drugs. Prescription drug management.   This patient presents to the ED for concern of diarrhea, this involves an extensive number of treatment options, and is a complaint that carries with it a high risk of complications and morbidity.  The differential diagnosis includes colitis, c diff, bacterial or viral infection, electrolyte abn   Co morbidities that complicate the patient evaluation   GERD, CAD, Obesity, HLD, HTN, Gout, COPD (on chronic O2), CVA, arthritis, DM2, chronic back pain, CHF, and hypothyroidism   Additional history obtained:  Additional history obtained from epic chart review External records from outside source obtained and reviewed including family   Lab Tests:  I Ordered, and personally interpreted labs.  The pertinent results include:  cbc with wbc 12.0, hgb 10.7 (hgb 12.2 in March); cmp with cr 1.35 (stable); UA + for UTI   Imaging Studies ordered:  I ordered imaging studies  including ct abd/pelvis  I independently visualized and interpreted imaging which showed  No acute or focal lesion to explain the patient's symptoms.  2. Stable simple cysts of the kidneys bilaterally. Recommend no  imaging follow-up.  3. Coronary artery disease.  4. Multilevel degenerative changes in the lumbar spine.  5.  Aortic Atherosclerosis (ICD10-I70.0).   I agree with the radiologist interpretation   Cardiac Monitoring:  The patient was maintained on a cardiac monitor.  I personally viewed and interpreted the cardiac monitored which showed an underlying rhythm of: nsr   Medicines ordered and prescription drug management:  I ordered medication including cipro/flagyl  for uti and diarrhea  Reevaluation of the patient after these medicines showed that the patient improved I have reviewed the patients home medicines and have made adjustments as needed   Test Considered:  ct   Critical Interventions:  abx  Problem List / ED Course:  Diarrhea:  no diarrhea while here.  CT neg for colitis.  Pt is feeling much better.  He is stable for d/c.  Return if worse.  Cipro/flagyl started. Uti:  cipro should cover   Reevaluation:  After the interventions noted above, I reevaluated the patient and found that they have :improved   Social Determinants of Health:  Lives at home   Dispostion:  After consideration of the diagnostic results and the patients response to treatment, I feel that the patent would benefit from discharge with outpatient f/u.          Final Clinical Impression(s) / ED Diagnoses Final diagnoses:  Diarrhea, unspecified type  Acute cystitis without hematuria    Rx / DC Orders ED Discharge Orders          Ordered    ciprofloxacin (CIPRO)  500 MG tablet  2 times daily        11/15/22 2046    metroNIDAZOLE (FLAGYL) 500 MG tablet  2 times daily        11/15/22 2046    Probiotic Product (ALIGN) 4 MG CAPS  Daily        11/15/22 2046               Jacalyn Lefevre, MD 11/15/22 2052

## 2022-11-15 NOTE — Assessment & Plan Note (Signed)
Home safety reviewed , will f/u with pt regarding lift chair

## 2022-11-15 NOTE — Assessment & Plan Note (Signed)
Better controlled on lower dose insulin [name] is reminded of the importance of commitment to daily physical activity for 30 minutes or more, as able and the need to limit carbohydrate intake to 30 to 60 grams per meal to help with blood sugar control.   The need to take medication as prescribed, test blood sugar as directed, and to call between visits if there is a concern that blood sugar is uncontrolled is also discussed.   [name] is reminded of the importance of daily foot exam, annual eye examination, and good blood sugar, blood pressure and cholesterol control.     Latest Ref Rng & Units 09/30/2022   11:10 AM 07/30/2022   10:12 AM 07/25/2022    9:51 AM 07/19/2022   12:28 AM 07/19/2022   12:22 AM  Diabetic Labs  HbA1c 4.0 - 5.6 %  6.0      Micro/Creat Ratio 0 - 29 mg/g creat 101       Chol 100 - 199 mg/dL 161       HDL >09 mg/dL 30       Calc LDL 0 - 99 mg/dL 72       Triglycerides 0 - 149 mg/dL 604       Creatinine 5.40 - 1.27 mg/dL   9.81  1.91  4.78       11/14/2022   10:07 AM 11/14/2022    9:53 AM 11/14/2022    9:49 AM 09/30/2022   10:14 AM 09/30/2022   10:13 AM 07/30/2022   10:01 AM 07/30/2022    9:54 AM  BP/Weight  Systolic BP 144 145 155 165 179 140 148  Diastolic BP 72 72 77 74 82 70 69  Wt. (Lbs)   258.12  262.12  266  BMI   37.04 kg/m2  37.61 kg/m2  38.17 kg/m2      07/30/2022   10:00 AM 06/20/2021    8:00 AM  Foot/eye exam completion dates  Foot Form Completion Done Done      Managed by Endo

## 2023-01-05 DIAGNOSIS — E119 Type 2 diabetes mellitus without complications: Secondary | ICD-10-CM | POA: Diagnosis not present

## 2023-01-05 DIAGNOSIS — I10 Essential (primary) hypertension: Secondary | ICD-10-CM | POA: Diagnosis not present

## 2023-01-05 DIAGNOSIS — E782 Mixed hyperlipidemia: Secondary | ICD-10-CM | POA: Diagnosis not present

## 2023-01-05 DIAGNOSIS — I25118 Atherosclerotic heart disease of native coronary artery with other forms of angina pectoris: Secondary | ICD-10-CM | POA: Diagnosis not present

## 2023-01-19 ENCOUNTER — Other Ambulatory Visit: Payer: Self-pay | Admitting: Family Medicine

## 2023-01-21 DIAGNOSIS — E1165 Type 2 diabetes mellitus with hyperglycemia: Secondary | ICD-10-CM | POA: Diagnosis not present

## 2023-01-21 DIAGNOSIS — E038 Other specified hypothyroidism: Secondary | ICD-10-CM | POA: Diagnosis not present

## 2023-01-21 DIAGNOSIS — E559 Vitamin D deficiency, unspecified: Secondary | ICD-10-CM | POA: Diagnosis not present

## 2023-01-30 ENCOUNTER — Ambulatory Visit: Payer: Medicare HMO | Admitting: Nurse Practitioner

## 2023-01-30 ENCOUNTER — Encounter: Payer: Self-pay | Admitting: Nurse Practitioner

## 2023-01-30 VITALS — BP 144/78 | HR 66 | Ht 70.0 in | Wt 251.6 lb

## 2023-01-30 DIAGNOSIS — I10 Essential (primary) hypertension: Secondary | ICD-10-CM | POA: Diagnosis not present

## 2023-01-30 DIAGNOSIS — E559 Vitamin D deficiency, unspecified: Secondary | ICD-10-CM | POA: Diagnosis not present

## 2023-01-30 DIAGNOSIS — E782 Mixed hyperlipidemia: Secondary | ICD-10-CM | POA: Diagnosis not present

## 2023-01-30 DIAGNOSIS — Z794 Long term (current) use of insulin: Secondary | ICD-10-CM | POA: Diagnosis not present

## 2023-01-30 DIAGNOSIS — E038 Other specified hypothyroidism: Secondary | ICD-10-CM | POA: Diagnosis not present

## 2023-01-30 DIAGNOSIS — E1165 Type 2 diabetes mellitus with hyperglycemia: Secondary | ICD-10-CM | POA: Diagnosis not present

## 2023-01-30 DIAGNOSIS — E039 Hypothyroidism, unspecified: Secondary | ICD-10-CM

## 2023-01-30 LAB — POCT GLYCOSYLATED HEMOGLOBIN (HGB A1C): Hemoglobin A1C: 6.1 % — AB (ref 4.0–5.6)

## 2023-01-30 MED ORDER — LEVOTHYROXINE SODIUM 112 MCG PO TABS
112.0000 ug | ORAL_TABLET | Freq: Every day | ORAL | 3 refills | Status: DC
Start: 2023-01-30 — End: 2023-07-30

## 2023-01-30 MED ORDER — NOVOLIN 70/30 (70-30) 100 UNIT/ML ~~LOC~~ SUSP: 35.0000 [IU] | Freq: Two times a day (BID) | SUBCUTANEOUS | 3 refills | Status: DC

## 2023-01-30 NOTE — Patient Instructions (Signed)

## 2023-01-30 NOTE — Progress Notes (Signed)
01/30/2023  Endocrinology follow-up note  Subjective:    Patient ID: [name], male    DOB: [birth year], PCP Kerri Perches, MD   Past Medical History:  Diagnosis Date   Abnormality of gait 05/01/2015   Anemia 04/19/2015   Annual physical exam 12/11/2013   Arthritis    "left leg; knees" (05/07/2017)   Arthritis of knee    CAD (coronary artery disease)    CHF (congestive heart failure) (HCC)    Phreesia 08/08/2020   Choking 07/13/2015   Chronic bronchitis (HCC)    "get it q yr"   Chronic lower back pain    "since I was a teen" (05/07/2017)   COPD (chronic obstructive pulmonary disease) (HCC)    CVA (cerebral vascular accident) (HCC) 05/2015   R sided weakness (05/07/2017)   Diabetes mellitus without complication (HCC)    Phreesia 08/08/2020   GERD (gastroesophageal reflux disease)    Gout    History of colonic polyps 04/03/2017   Added automatically from request for surgery [id number]   Hyperlipidemia    Hypertension    Myocardial infarction (HCC) 1999   Obesity    OSA on CPAP    Oxygen deficiency    Phreesia 08/08/2020   Pneumonia    "a few times; last time was ~ 06/2013" (05/07/2017)   Psoriasis    Stroke (HCC)    Phreesia 08/08/2020   Thyroid disease    Phreesia 08/08/2020   Tussive syncope 05/01/2015   Type II diabetes mellitus (HCC)    Vitamin B12 deficiency 05/01/2015   Past Surgical History:  Procedure Laterality Date   APPENDECTOMY     CATARACT EXTRACTION W/PHACO Left 10/07/2016   Procedure: CATARACT EXTRACTION PHACO AND INTRAOCULAR LENS PLACEMENT (IOC);  Surgeon: Jethro Bolus, MD;  Location: [facility];  Service: Ophthalmology;  Laterality: Left;  CDE: 13.49   CATARACT EXTRACTION W/PHACO Right 10/28/2016   Procedure: CATARACT EXTRACTION PHACO AND INTRAOCULAR LENS PLACEMENT (IOC);  Surgeon: Jethro Bolus, MD;  Location: [facility];  Service: Ophthalmology;  Laterality: Right;  CDE: 16.71   COLONOSCOPY  03/17/2012   Procedure: COLONOSCOPY;  Surgeon: Malissa Hippo, MD;  Location: [facility];  Service: Endoscopy;  Laterality: N/A;  830   COLONOSCOPY N/A 06/25/2017   Procedure: COLONOSCOPY;  Surgeon: Malissa Hippo, MD;  Location: [facility];  Service: Endoscopy;  Laterality: N/A;  930   CORONARY ANGIOPLASTY WITH STENT PLACEMENT  1999- 2009-08/30/2013   "counting today's, I have 5 stents" (08/30/2013)   CORONARY ANGIOPLASTY WITH STENT PLACEMENT  05/07/2017   CORONARY STENT INTERVENTION N/A 05/07/2017   Procedure: CORONARY STENT INTERVENTION;  Surgeon: Rinaldo Cloud, MD;  Location: [facility];  Service: Cardiovascular;  Laterality: N/A;   CYSTOSCOPY WITH URETHRAL DILATATION N/A 11/26/2012   Procedure: CYSTOSCOPY WITH URETHRAL DILATATION;  Surgeon: Anner Crete, MD;  Location: [facility];  Service: Urology;  Laterality: N/A;   LEFT HEART CATH AND CORONARY ANGIOGRAPHY N/A 05/07/2017   Procedure: LEFT HEART CATH AND CORONARY ANGIOGRAPHY;  Surgeon: Rinaldo Cloud, MD;  Location: [facility];  Service: Cardiovascular;  Laterality: N/A;   LEFT HEART CATHETERIZATION WITH CORONARY ANGIOGRAM N/A 08/30/2013   Procedure: LEFT HEART CATHETERIZATION WITH CORONARY ANGIOGRAM;  Surgeon: Robynn Pane, MD;  Location: [facility];  Service: Cardiovascular;  Laterality: N/A;   PERCUTANEOUS CORONARY STENT INTERVENTION (PCI-S) Right 08/30/2013   Procedure: PERCUTANEOUS CORONARY STENT INTERVENTION (PCI-S);  Surgeon: Robynn Pane, MD;  Location: [facility];  Service: Cardiovascular;  Laterality: Right;   Social History   Socioeconomic History   Marital status: Married    Spouse name: Not on file   Number of children: 2   Years of education: 11   Highest education level: 12th grade  Occupational History   Occupation: Restaurant manager, fast food     Comment: retired   Tobacco Use   Smoking status: Former    Current packs/day: 0.00    Average packs/day: 0.5 packs/day for 33.0 years (16.5 ttl pk-yrs)    Types: Cigarettes    Start date: 11/28/1969     Quit date: 11/29/2002    Years since quitting: 20.1   Smokeless tobacco: Never  Vaping Use   Vaping status: Never Used  Substance and Sexual Activity   Alcohol use: No   Drug use: No   Sexual activity: Not Currently  Other Topics Concern   Not on file  Social History Narrative   Patient drinks about 3 cups of soda daily.    Patient is left handed.    Lives alone with wife    Social Determinants of Health   Financial Resource Strain: Low Risk  (07/21/2022)   Overall Financial Resource Strain (CARDIA)    Difficulty of Paying Living Expenses: Not hard at all  Food Insecurity: Unknown (08/08/2019)   Hunger Vital Sign    Worried About Running Out of Food in the Last Year: Not on file    Ran Out of Food in the Last Year: Never true  Transportation Needs: No Transportation Needs (07/21/2022)   PRAPARE - Administrator, Civil Service (Medical): No    Lack of Transportation (Non-Medical): No  Physical Activity: Inactive (08/08/2020)   Exercise Vital Sign    Days of Exercise per Week: 0 days    Minutes of Exercise per Session: 0 min  Stress: No Stress Concern Present (08/08/2019)   [employer] of Occupational Health - Occupational Stress Questionnaire    Feeling of Stress : Not at all  Social Connections: Moderately Integrated (08/08/2020)   Social Connection and Isolation Panel [NHANES]    Frequency of Communication with Friends and Family: Three times a week    Frequency of Social Gatherings with Friends and Family: Three times a week    Attends Religious Services: 1 to 4 times per year    Active Member of Clubs or Organizations: No    Attends Banker Meetings: Never    Marital Status: Married   Outpatient Encounter Medications as of 01/30/2023  Medication Sig   acetaminophen (TYLENOL) 500 MG tablet Take 1,000 mg by mouth every 6 (six) hours as needed for moderate pain (left thigh).    albuterol (VENTOLIN HFA) 108 (90 Base) MCG/ACT inhaler INHALE 1 TO 2  PUFFS EVERY 6 HOURS AS NEEDED FOR WHEEZING OR SHORTNESS OF BREATH (Patient taking differently: Inhale 2 puffs into the lungs every 6 (six) hours as needed for shortness of breath or wheezing.)   allopurinol (ZYLOPRIM) 300 MG tablet TAKE 1 TABLET EVERY DAY   aspirin EC 81 MG tablet Take 81 mg by mouth daily.   Blood Glucose Calibration (TRUE METRIX LEVEL 1) Low SOLN Use as directed   Blood Glucose Monitoring Suppl (TRUE METRIX METER) w/Device KIT USE AS DIRECTED   clobetasol ointment (TEMOVATE) 0.05 % Apply twice daily to affected areas for 14 days then once daily, as needed   clopidogrel (PLAVIX) 75 MG tablet TAKE 1 TABLET EVERY DAY   DROPLET INSULIN SYRINGE 31G X  5/16" 1 ML MISC USE AS DIRECTED IN THE MORNING AND AT BEDTIME   ezetimibe (ZETIA) 10 MG tablet TAKE 1 TABLET EVERY DAY   glucose blood (TRUE METRIX BLOOD GLUCOSE TEST) test strip TEST BLOOD SUGAR FOUR TIMES DAILY   isosorbide mononitrate (IMDUR) 30 MG 24 hr tablet TAKE 1 TABLET EVERY DAY   levETIRAcetam (KEPPRA) 500 MG tablet TAKE 1 TABLET TWICE DAILY   metoprolol tartrate (LOPRESSOR) 50 MG tablet TAKE 1/2 TABLET TWICE DAILY   pantoprazole (PROTONIX) 40 MG tablet TAKE 1 TABLET EVERY DAY BEFORE BREAKFAST (Patient taking differently: Take 40 mg by mouth daily.)   polyethylene glycol (MIRALAX / GLYCOLAX) 17 g packet Take 17 g by mouth daily as needed.   pravastatin (PRAVACHOL) 40 MG tablet TAKE 2 TABLETS EVERY DAY   Probiotic Product (ALIGN) 4 MG CAPS Take 1 capsule (4 mg total) by mouth daily.   spironolactone (ALDACTONE) 25 MG tablet Take 12.5 mg by mouth daily. Take half a tablet by mouth daily   spironolactone (ALDACTONE) 50 MG tablet TAKE 1 TABLET EVERY DAY (DOSE INCREASE)   TRUEplus Lancets 33G MISC TEST BLOOD SUGAR FOUR TIMES DAILY   UNABLE TO FIND CPAP mask and tubing  DX G47.33   Vitamin D, Ergocalciferol, (DRISDOL) 1.25 MG (50000 UNIT) CAPS capsule Take 1 capsule (50,000 Units total) by mouth every 7 (seven) days.    [DISCONTINUED] insulin NPH-regular Human (NOVOLIN 70/30) (70-30) 100 UNIT/ML injection Inject 35-45 Units into the skin 2 (two) times daily with a meal. Take 45 units with breakfast and 35 units with supper if glucose is above 90 and eating.   [DISCONTINUED] levothyroxine (SYNTHROID) 112 MCG tablet TAKE 1 TABLET EVERY DAY   insulin NPH-regular Human (NOVOLIN 70/30) (70-30) 100 UNIT/ML injection Inject 35-45 Units into the skin 2 (two) times daily with a meal. Take 45 units with breakfast and 35 units with supper if glucose is above 90 and eating.   levothyroxine (SYNTHROID) 112 MCG tablet Take 1 tablet (112 mcg total) by mouth daily.   nitroGLYCERIN (NITROSTAT) 0.4 MG SL tablet Place 1 tablet (0.4 mg total) under the tongue every 5 (five) minutes as needed for chest pain.   [DISCONTINUED] ciprofloxacin (CIPRO) 500 MG tablet Take 1 tablet (500 mg total) by mouth 2 (two) times daily. (Patient not taking: Reported on 01/30/2023)   [DISCONTINUED] metroNIDAZOLE (FLAGYL) 500 MG tablet Take 1 tablet (500 mg total) by mouth 2 (two) times daily. (Patient not taking: Reported on 01/30/2023)   No facility-administered encounter medications on file as of 01/30/2023.   ALLERGIES: No Known Allergies VACCINATION STATUS: Immunization History  Administered Date(s) Administered   Fluad Quad(high Dose 65+) 01/31/2019, 03/01/2020, 06/20/2021, 03/11/2022   Influenza Whole 03/18/2005   Influenza, High Dose Seasonal PF 02/22/2018   Influenza,inj,Quad PF,6+ Mos 03/19/2017   PFIZER(Purple Top)SARS-COV-2 Vaccination 06/25/2019, 07/16/2019, 04/22/2020   Pneumococcal Conjugate-13 12/08/2013   Pneumococcal Polysaccharide-23 06/09/2012   Td 12/04/2003   Tdap 03/06/2017    Diabetes He presents for his follow-up diabetic visit. He has type 2 diabetes mellitus. Onset time: He was diagnosed at approximate age of 66 years. His disease course has been stable. There are no hypoglycemic associated symptoms. Pertinent negatives  for hypoglycemia include no confusion, headaches, nervousness/anxiousness, pallor, seizures or tremors. Associated symptoms include weight loss. Pertinent negatives for diabetes include no chest pain, no fatigue, no polydipsia, no polyphagia, no polyuria and no weakness. There are no hypoglycemic complications. Symptoms are stable. Diabetic complications include a CVA, heart disease, nephropathy and  peripheral neuropathy. Risk factors for coronary artery disease include dyslipidemia, diabetes mellitus, male sex, obesity, tobacco exposure, sedentary lifestyle and hypertension. Current diabetic treatment includes insulin injections. He is compliant with treatment most of the time. His weight is decreasing steadily. He is following a generally healthy diet. When asked about meal planning, he reported none. He has not had a previous visit with a dietitian. He rarely participates in exercise. His home blood glucose trend is fluctuating minimally. His breakfast blood glucose range is generally 90-110 mg/dl. His dinner blood glucose range is generally 140-180 mg/dl. (He presents today with his logs, no meter, showing at goal glycemic profile overall.  His POCT A1c today is 6.1%, essentially unchanged from previous visit.  He did go to the ED recently for diarrhea and says it has still not completely resolved.  He denies any significant hypoglycemia.) An ACE inhibitor/angiotensin II receptor blocker is not being taken. He does not see a podiatrist.Eye exam is current.  Hyperlipidemia This is a chronic problem. The current episode started more than 1 year ago. The problem is uncontrolled. Recent lipid tests were reviewed and are variable (elevated triglycerides). Exacerbating diseases include chronic renal disease, diabetes, hypothyroidism and obesity. Factors aggravating his hyperlipidemia include beta blockers and fatty foods. Associated symptoms include shortness of breath. Pertinent negatives include no chest pain or  myalgias. Current antihyperlipidemic treatment includes statins. The current treatment provides moderate improvement of lipids. Compliance problems include adherence to exercise and adherence to diet (r/t decreased mobility from prior CVA).  Risk factors for coronary artery disease include diabetes mellitus, dyslipidemia, hypertension, male sex, a sedentary lifestyle and obesity.  Hypertension This is a chronic problem. The current episode started more than 1 year ago. The problem is unchanged. The problem is controlled. Associated symptoms include shortness of breath. Pertinent negatives include no chest pain, headaches, neck pain or palpitations. Agents associated with hypertension include thyroid hormones. Risk factors for coronary artery disease include dyslipidemia, diabetes mellitus, sedentary lifestyle, male gender and obesity. Past treatments include beta blockers and diuretics. The current treatment provides mild improvement. There are no compliance problems.  Hypertensive end-organ damage includes kidney disease, CAD/MI and CVA. Identifiable causes of hypertension include chronic renal disease and a thyroid problem.  Thyroid Problem Presents for follow-up visit. Symptoms include weight loss. Patient reports no anxiety, cold intolerance, constipation, depressed mood, diarrhea, fatigue, heat intolerance, palpitations, tremors or weight gain. The symptoms have been stable. Past treatments include levothyroxine. His past medical history is significant for diabetes and hyperlipidemia.     Review of systems  Constitutional: + steadily decreasing body weight,  current Body mass index is 36.1 kg/m. , no fatigue, no subjective hyperthermia, no subjective hypothermia Eyes: no blurry vision, no xerophthalmia ENT: no sore throat, no nodules palpated in throat, no dysphagia/odynophagia, no hoarseness Cardiovascular: no chest pain, no shortness of breath, no palpitations, no leg swelling Respiratory: no  cough, no shortness of breath Gastrointestinal: no nausea/vomiting/diarrhea Musculoskeletal: no muscle/joint aches, walks with platform walker due to mobility deficits from previous CVA Skin: no rashes, no hyperemia Neurological: no tremors, no tingling, no dizziness, numbness reported in RUE and RLE (from previous stroke) Psychiatric: no depression, no anxiety    Objective:    BP (!) 144/78 (BP Location: Right Arm, Patient Position: Sitting, Cuff Size: Large) Comment: Retake Manuel Cuff  Pulse 66   Ht 5\' 10"  (1.778 m)   Wt 251 lb 9.6 oz (114.1 kg)   BMI 36.10 kg/m   Wt Readings from Last  3 Encounters:  01/30/23 251 lb 9.6 oz (114.1 kg)  11/15/22 258 lb (117 kg)  11/14/22 258 lb 1.9 oz (117.1 kg)     BP Readings from Last 3 Encounters:  01/30/23 (!) 144/78  11/15/22 135/71  11/14/22 (!) 144/72    Physical Exam- Limited  Constitutional:  Body mass index is 36.1 kg/m. , not in acute distress, normal state of mind Eyes:  EOMI, no exophthalmos Musculoskeletal: no gross deformities, walks with walker due to previous CVA Skin:  no rashes, no hyperemia Neurological: no tremor with outstretched hands  Diabetic Foot Exam - Simple   No data filed     CMP     Component Value Date/Time   NA 138 01/21/2023 0954   K 4.4 01/21/2023 0954   CL 102 01/21/2023 0954   CO2 22 01/21/2023 0954   GLUCOSE 132 (H) 01/21/2023 0954   GLUCOSE 116 (H) 11/15/2022 1756   BUN 14 01/21/2023 0954   CREATININE 1.36 (H) 01/21/2023 0954   CREATININE 1.12 02/16/2020 0838   CALCIUM 9.3 01/21/2023 0954   PROT 6.9 01/21/2023 0954   ALBUMIN 3.7 (L) 01/21/2023 0954   AST 14 01/21/2023 0954   ALT 10 01/21/2023 0954   ALKPHOS 89 01/21/2023 0954   BILITOT 0.2 01/21/2023 0954   GFRNONAA 53 (L) 11/15/2022 1756   GFRNONAA 63 02/16/2020 0838   GFRAA 53 (L) 06/20/2020 0907   GFRAA 73 02/16/2020 0838     Diabetic Labs (most recent): Lab Results  Component Value Date   HGBA1C 6.1 (A) 01/30/2023    HGBA1C 6.0 (A) 07/30/2022   HGBA1C 6.6 (A) 01/29/2022   MICROALBUR 6.4 10/11/2019   MICROALBUR 4.2 07/13/2018   MICROALBUR 7.8 (H) 03/23/2014     Lipid Panel ( most recent) Lipid Panel     Component Value Date/Time   CHOL 129 09/30/2022 1110   TRIG 155 (H) 09/30/2022 1110   HDL 30 (L) 09/30/2022 1110   CHOLHDL 4.3 09/30/2022 1110   CHOLHDL 5.3 (H) 10/11/2019 0924   VLDL 35 04/20/2017 1130   LDLCALC 72 09/30/2022 1110   LDLCALC 67 10/11/2019 0924   LDLDIRECT 50 08/18/2014 0737      Assessment & Plan:   1) Type 2 diabetes mellitus with multiple competitions including diabetic nephropathy  -His diabetes is complicated by coronary artery disease, CVA, CKD and patient remains at a high risk for more acute and chronic complications of diabetes which include CAD, CVA, CKD, retinopathy, and neuropathy. These are all discussed in detail with the patient.  He presents today with his logs, no meter, showing at goal glycemic profile overall.  His POCT A1c today is 6.1%, essentially unchanged from previous visit.  He did go to the ED recently for diarrhea and says it has still not completely resolved.  He denies any significant hypoglycemia.  - Recent labs reviewed.  - Nutritional counseling repeated at each appointment due to patients tendency to fall back in to old habits.  - The patient admits there is a room for improvement in their diet and drink choices. -  Suggestion is made for the patient to avoid simple carbohydrates from their diet including Cakes, Sweet Desserts / Pastries, Ice Cream, Soda (diet and regular), Sweet Tea, Candies, Chips, Cookies, Sweet Pastries, Store Bought Juices, Alcohol in Excess of 1-2 drinks a day, Artificial Sweeteners, Coffee Creamer, and "Sugar-free" Products. This will help patient to have stable blood glucose profile and potentially avoid unintended weight gain.   - I encouraged the  patient to switch to unprocessed or minimally processed complex starch  and increased protein intake (animal or plant source), fruits, and vegetables.   - Patient is advised to stick to a routine mealtimes to eat 3 meals a day and avoid unnecessary snacks (to snack only to correct hypoglycemia).  - I have approached patient with the following individualized plan to manage diabetes and patient agrees.  - He has done reasonably very well on premixed Novolin 70/30. He wishes to stay on the same regimen for cost reasons.   -Given his stable glycemic profile and lack of hypoglycemia no changes will be made to his regimen today.  He is advised to continue Novolin 70/30  45 units with breakfast and 35 units with supper if glucose is above 90 and he is eating.    -He is advised to continue monitoring blood glucose at least 3 times per day, before injecting insulin at breakfast and supper, and before bed.  He is instructed to call the clinic if he has readings less than 70 or greater than 300 for 3 tests in a row.  -He is warned not to take insulin without proper monitoring per orders.  - Patient specific target  for A1c; LDL, HDL, Triglycerides, and  Waist Circumference were discussed in detail.  2) BP/HTN:  His blood pressure is controlled to target for his age.  He is advised to continue Metoprolol 25 mg po twice daily, Isosorbide 30 mg po daily, and Spironolactone 50 mg po daily.    3) Lipids/HPL:  His most recent lipid panel from 09/30/22 shows controlled LDL of 72 and elevated triglycerides of 155.  He is advised to continue Pravastatin 40 mg po daily at bedtime.  Side effects and precautions discussed with him.  He is advised to avoid fried foods and butter.    4)  Weight/Diet: His Body mass index is 36.1 kg/m.-  He is a candidate for modest weight loss.  He cannot exercise optimally, limited by pulmonary sufficiency due to COPD and previous CVA.  CDE consult in progress, exercise, and carbohydrates information provided.  5) Hypothyroidism:  -His previsit thyroid  function tests are consistent with appropriate hormone replacement.  He is advised to continue Levothyroxine 112 mcg po daily before breakfast.    - We discussed about the correct intake of his thyroid hormone, on empty stomach at fasting, with water, separated by at least 30 minutes from breakfast and other medications,  and separated by more than 4 hours from calcium, iron, multivitamins, acid reflux medications (PPIs). -Patient is made aware of the fact that thyroid hormone replacement is needed for life, dose to be adjusted by periodic monitoring of thyroid function tests.  6) Vitamin D deficiency His most recent vitamin D level on 01/21/23 was 55.8.  He is not sure if he is still taking his supplement as his wife manages his medication.  He is advised to start OTC Vitamin D3 5000 units daily until next measurement.  7) Chronic Care/Health Maintenance: -Patient is on Statin medications and encouraged to continue to follow up with Ophthalmology, nephrology, Podiatrist at least yearly or according to recommendations, and advised to stay away from smoking. I have recommended yearly flu vaccine and pneumonia vaccination at least every 5 years;  and  sleep for at least 7 hours a day.   - I advised patient to maintain close follow up with Kerri Perches, MD for primary care needs.     I spent  31  minutes  in the care of the patient today including review of labs from CMP, Lipids, Thyroid Function, Hematology (current and previous including abstractions from other facilities); face-to-face time discussing  his blood glucose readings/logs, discussing hypoglycemia and hyperglycemia episodes and symptoms, medications doses, his options of short and long term treatment based on the latest standards of care / guidelines;  discussion about incorporating lifestyle medicine;  and documenting the encounter. Risk reduction counseling performed per USPSTF guidelines to reduce obesity and cardiovascular risk  factors.     Please refer to Patient Instructions for Blood Glucose Monitoring and Insulin/Medications Dosing Guide"  in media tab for additional information. Please  also refer to " Patient Self Inventory" in the Media  tab for reviewed elements of pertinent patient history.  [name] participated in the discussions, expressed understanding, and voiced agreement with the above plans.  All questions were answered to his satisfaction. he is encouraged to contact clinic should he have any questions or concerns prior to his return visit.   Follow up plan: -Return in about 6 months (around 07/30/2023) for Diabetes F/U with A1c in office, No previsit labs, Bring meter and logs.  Ronny Bacon, [facility] [address] [place], [place] [address] Phone: [phone] Fax: [phone]  01/30/2023, 10:52 AM

## 2023-02-04 ENCOUNTER — Encounter: Payer: Self-pay | Admitting: Family Medicine

## 2023-02-04 ENCOUNTER — Ambulatory Visit (INDEPENDENT_AMBULATORY_CARE_PROVIDER_SITE_OTHER): Payer: Medicare HMO | Admitting: Family Medicine

## 2023-02-04 ENCOUNTER — Other Ambulatory Visit: Payer: Self-pay

## 2023-02-04 VITALS — BP 137/68 | HR 79 | Ht 70.0 in | Wt 250.0 lb

## 2023-02-04 DIAGNOSIS — E038 Other specified hypothyroidism: Secondary | ICD-10-CM

## 2023-02-04 DIAGNOSIS — Z23 Encounter for immunization: Secondary | ICD-10-CM | POA: Diagnosis not present

## 2023-02-04 DIAGNOSIS — R194 Change in bowel habit: Secondary | ICD-10-CM

## 2023-02-04 DIAGNOSIS — I10 Essential (primary) hypertension: Secondary | ICD-10-CM | POA: Diagnosis not present

## 2023-02-04 DIAGNOSIS — E1121 Type 2 diabetes mellitus with diabetic nephropathy: Secondary | ICD-10-CM

## 2023-02-04 DIAGNOSIS — E785 Hyperlipidemia, unspecified: Secondary | ICD-10-CM

## 2023-02-04 DIAGNOSIS — R569 Unspecified convulsions: Secondary | ICD-10-CM

## 2023-02-04 DIAGNOSIS — G8191 Hemiplegia, unspecified affecting right dominant side: Secondary | ICD-10-CM | POA: Diagnosis not present

## 2023-02-04 DIAGNOSIS — J449 Chronic obstructive pulmonary disease, unspecified: Secondary | ICD-10-CM

## 2023-02-04 DIAGNOSIS — Z794 Long term (current) use of insulin: Secondary | ICD-10-CM

## 2023-02-04 MED ORDER — UNABLE TO FIND: 0 refills | Status: DC

## 2023-02-04 NOTE — Progress Notes (Signed)
[name]     MRN: [id number]      DOB: [birth year]  Chief Complaint  Patient presents with   Follow-up    Follow up seen in ER for diarrhea still going on     HPI [name] is here for follow up and re-evaluation of chronic medical conditions, medication management and review of any available recent lab and radiology data.  Preventive health is updated, specifically  d Immunization.   3 month h/o frequent  stool over 6/day mainly mucus, scant stool Still needs to get lift chair, has difficulty getting up from chair without assistance, gait is unsteady Denies polyuria, polydipsia, blurred vision , or hypoglycemic episodes.   ROS Denies recent fever or chills. Denies sinus pressure, nasal congestion, ear pain or sore throat. Denies chest congestion, productive cough or wheezing. Denies chest pains, palpitations and leg swelling  Chronic  joint pain, swelling and limitation in mobility. Denies headaches, seizures,  c/o numbness, . Denies depression, anxiety or insomnia. Denies skin break down or rash.   PE  BP 137/68 (BP Location: Left Arm, Patient Position: Sitting, Cuff Size: Large)   Pulse 79   Ht 5\' 10"  (1.778 m)   Wt 250 lb (113.4 kg)   SpO2 (!) 89%   BMI 35.87 kg/m   Patient alert and oriented and in no cardiopulmonary distress.  HEENT: No facial asymmetry, EOMI,     Neck decreased ROM .  Chest: Clear to auscultation bilaterally.Decreased air entry throughout  CVS: S1, S2 no murmurs, no S3.Regular rate.  ABD: Soft non tender.   Ext: No edema  MS: Markedly decreased  ROM spine, shoulders, hips and knees.  Skin: Intact, no ulcerations or rash noted.  Psych: Good eye contact, normal affect.  not anxious or depressed appearing.  CNS: CN 2-12 intact, right hemiparesis Assessment & Plan  Change in stool habits 3 month h/o loose stool, no change in  eating habits, refer GI. No associated n/v, no other family member affected, no chills, body aches ,  otherwise feels well  Essential hypertension Controlled, no change in medication   COPD (chronic obstructive pulmonary disease) (HCC) Stabble no recent or current flare  Hyperlipidemia LDL goal <70 Hyperlipidemia:Low fat diet discussed and encouraged.   Lipid Panel  Lab Results  Component Value Date   CHOL 129 09/30/2022   HDL 30 (L) 09/30/2022   LDLCALC 72 09/30/2022   LDLDIRECT 50 08/18/2014   TRIG 155 (H) 09/30/2022   CHOLHDL 4.3 09/30/2022     Needs to reduce fat in diet , no med change  Type 2 diabetes mellitus with diabetic nephropathy [facility]) [name] is reminded of the importance of commitment to daily physical activity for 30 minutes or more, as able and the need to limit carbohydrate intake to 30 to 60 grams per meal to help with blood sugar control.   The need to take medication as prescribed, test blood sugar as directed, and to call between visits if there is a concern that blood sugar is uncontrolled is also discussed.   [name] is reminded of the importance of daily foot exam, annual eye examination, and good blood sugar, blood pressure and cholesterol control.     Latest Ref Rng & Units 01/30/2023   10:42 AM 01/21/2023    9:54 AM 11/15/2022    5:56 PM 11/14/2022   10:27 AM 09/30/2022   11:10 AM  Diabetic Labs  HbA1c 4.0 - 5.6 % 6.1  Micro/Creat Ratio 0 - 29 mg/g creat     101   Chol 100 - 199 mg/dL     259   HDL >56 mg/dL     30   Calc LDL 0 - 99 mg/dL     72   Triglycerides 0 - 149 mg/dL     387   Creatinine 5.64 - 1.27 mg/dL  3.32  9.51  8.84        02/04/2023    9:49 AM 02/04/2023    9:48 AM 01/30/2023   10:34 AM 01/30/2023   10:27 AM 11/15/2022    9:31 PM 11/15/2022    6:30 PM 11/15/2022    6:15 PM  BP/Weight  Systolic BP 137 146 144 156 135 139 130  Diastolic BP 68 65 78 76 71 72 68  Wt. (Lbs)  250  251.6     BMI  35.87 kg/m2  36.1 kg/m2         07/30/2022   10:00 AM 06/20/2021    8:00 AM  Foot/eye exam completion dates  Foot Form  Completion Done Done      Controlled, no change in medication Manged by Endo  Right hemiparesis (HCC) Needs lift chair  Morbid obesity (HCC)  Patient re-educated about  the importance of commitment to a  minimum of 150 minutes of exercise per week as able.  The importance of healthy food choices with portion control discussed, as well as eating regularly and within a 12 hour window most days. The need to choose "clean , green" food 50 to 75% of the time is discussed, as well as to make water the primary drink and set a goal of 64 ounces water daily.       02/04/2023    9:48 AM 01/30/2023   10:27 AM 11/15/2022    5:39 PM  Weight /BMI  Weight 250 lb 251 lb 9.6 oz 258 lb  Height 5\' 10"  (1.778 m) 5\' 10"  (1.778 m) 5\' 11"  (1.803 m)  BMI 35.87 kg/m2 36.1 kg/m2 35.98 kg/m2    Unchanged  Hypothyroidism Controlled, no change in medication   Partial seizure (HCC) Controlled, no change in medication

## 2023-02-04 NOTE — Patient Instructions (Addendum)
F/u in 4 months, call if you need me sooner  Pls schedule diabetic retinal screen in office   Flu vaccine today  You are referred to GI re change in BM x 3 months  Otherwise you are doing well Keep up the great work!  Nurse pls provide feedback re lift chair, which he will benefit from  Carerful not to fall  Need covid vaccine at your pharmacy  Happy 19th birthday!!!  Thanks for choosing [facility], we consider it a privelige to serve you.

## 2023-02-06 DIAGNOSIS — R194 Change in bowel habit: Secondary | ICD-10-CM | POA: Insufficient documentation

## 2023-02-06 NOTE — Assessment & Plan Note (Signed)
Controlled, no change in medication  

## 2023-02-06 NOTE — Assessment & Plan Note (Signed)
  Patient re-educated about  the importance of commitment to a  minimum of 150 minutes of exercise per week as able.  The importance of healthy food choices with portion control discussed, as well as eating regularly and within a 12 hour window most days. The need to choose "clean , green" food 50 to 75% of the time is discussed, as well as to make water the primary drink and set a goal of 64 ounces water daily.       02/04/2023    9:48 AM 01/30/2023   10:27 AM 11/15/2022    5:39 PM  Weight /BMI  Weight 250 lb 251 lb 9.6 oz 258 lb  Height 5\' 10"  (1.778 m) 5\' 10"  (1.778 m) 5\' 11"  (1.803 m)  BMI 35.87 kg/m2 36.1 kg/m2 35.98 kg/m2    Unchanged

## 2023-02-06 NOTE — Assessment & Plan Note (Signed)
Stabble no recent or current flare

## 2023-02-06 NOTE — Assessment & Plan Note (Signed)
Hyperlipidemia:Low fat diet discussed and encouraged.   Lipid Panel  Lab Results  Component Value Date   CHOL 129 09/30/2022   HDL 30 (L) 09/30/2022   LDLCALC 72 09/30/2022   LDLDIRECT 50 08/18/2014   TRIG 155 (H) 09/30/2022   CHOLHDL 4.3 09/30/2022     Needs to reduce fat in diet , no med change

## 2023-02-06 NOTE — Assessment & Plan Note (Signed)
Needs lift chair

## 2023-02-06 NOTE — Assessment & Plan Note (Signed)
Controlled, no change in medication

## 2023-02-06 NOTE — Assessment & Plan Note (Signed)
3 month h/o loose stool, no change in  eating habits, refer GI. No associated n/v, no other family member affected, no chills, body aches , otherwise feels well

## 2023-02-06 NOTE — Assessment & Plan Note (Addendum)
[name] is reminded of the importance of commitment to daily physical activity for 30 minutes or more, as able and the need to limit carbohydrate intake to 30 to 60 grams per meal to help with blood sugar control.   The need to take medication as prescribed, test blood sugar as directed, and to call between visits if there is a concern that blood sugar is uncontrolled is also discussed.   [name] is reminded of the importance of daily foot exam, annual eye examination, and good blood sugar, blood pressure and cholesterol control.     Latest Ref Rng & Units 01/30/2023   10:42 AM 01/21/2023    9:54 AM 11/15/2022    5:56 PM 11/14/2022   10:27 AM 09/30/2022   11:10 AM  Diabetic Labs  HbA1c 4.0 - 5.6 % 6.1       Micro/Creat Ratio 0 - 29 mg/g creat     101   Chol 100 - 199 mg/dL     161   HDL >09 mg/dL     30   Calc LDL 0 - 99 mg/dL     72   Triglycerides 0 - 149 mg/dL     604   Creatinine 5.40 - 1.27 mg/dL  9.81  1.91  4.78        02/04/2023    9:49 AM 02/04/2023    9:48 AM 01/30/2023   10:34 AM 01/30/2023   10:27 AM 11/15/2022    9:31 PM 11/15/2022    6:30 PM 11/15/2022    6:15 PM  BP/Weight  Systolic BP 137 146 144 156 135 139 130  Diastolic BP 68 65 78 76 71 72 68  Wt. (Lbs)  250  251.6     BMI  35.87 kg/m2  36.1 kg/m2         07/30/2022   10:00 AM 06/20/2021    8:00 AM  Foot/eye exam completion dates  Foot Form Completion Done Done      Controlled, no change in medication Manged by Endo

## 2023-02-10 ENCOUNTER — Ambulatory Visit (INDEPENDENT_AMBULATORY_CARE_PROVIDER_SITE_OTHER): Payer: Medicare HMO | Admitting: Gastroenterology

## 2023-02-10 ENCOUNTER — Telehealth (INDEPENDENT_AMBULATORY_CARE_PROVIDER_SITE_OTHER): Payer: Self-pay | Admitting: Gastroenterology

## 2023-02-10 ENCOUNTER — Encounter (INDEPENDENT_AMBULATORY_CARE_PROVIDER_SITE_OTHER): Payer: Self-pay | Admitting: Gastroenterology

## 2023-02-10 VITALS — BP 139/75 | HR 79 | Temp 97.6°F | Ht 70.0 in | Wt 248.5 lb

## 2023-02-10 DIAGNOSIS — Z6835 Body mass index (BMI) 35.0-35.9, adult: Secondary | ICD-10-CM | POA: Diagnosis not present

## 2023-02-10 DIAGNOSIS — K921 Melena: Secondary | ICD-10-CM | POA: Diagnosis not present

## 2023-02-10 DIAGNOSIS — R194 Change in bowel habit: Secondary | ICD-10-CM | POA: Diagnosis not present

## 2023-02-10 DIAGNOSIS — D649 Anemia, unspecified: Secondary | ICD-10-CM

## 2023-02-10 DIAGNOSIS — K5904 Chronic idiopathic constipation: Secondary | ICD-10-CM | POA: Insufficient documentation

## 2023-02-10 DIAGNOSIS — R197 Diarrhea, unspecified: Secondary | ICD-10-CM | POA: Insufficient documentation

## 2023-02-10 MED ORDER — POLYETHYLENE GLYCOL 3350 17 G PO PACK
17.0000 g | PACK | Freq: Two times a day (BID) | ORAL | 0 refills | Status: AC
Start: 2023-02-10 — End: 2023-05-11

## 2023-02-10 MED ORDER — PSYLLIUM 58.6 % PO PACK
1.0000 | PACK | Freq: Two times a day (BID) | ORAL | 2 refills | Status: AC
Start: 2023-02-10 — End: 2023-05-11

## 2023-02-10 MED ORDER — PEG 3350-KCL-NA BICARB-NACL 420 G PO SOLR: 4000.0000 mL | Freq: Once | ORAL | 0 refills | Status: DC

## 2023-02-10 NOTE — Patient Instructions (Signed)
It was very nice to meet you today, as dicussed with will plan for the following :  Ensure adequate fluid intake: Aim for 8 glasses of water daily. Follow a high fiber diet: Include foods such as dates, prunes, pears, and kiwi. Take Miralax twice a day for the first week, then reduce to once daily thereafter. Use Metamucil twice a day.   Colonoscopy with 2 day prep

## 2023-02-10 NOTE — Telephone Encounter (Signed)
02/10/23  [name] December 30, 1940  What type of surgery is being performed? Colonoscopy  When is surgery scheduled? 03/12/23  Clearance to hold Plavix for 5 days prior to procedure  Name of physician performing surgery?  Dr. Starleen Arms [facility] at [facility] Phone: [phone] Fax: [phone]  Anethesia type (none, local, MAC, general)? MAC

## 2023-02-10 NOTE — H&P (View-Only) (Signed)
Vista Lawman , M.D. Gastroenterology & Hepatology [facility] G[facility]enterology [address] [place], [place] [address] Primary Care Physician: Kerri Perches, MD [address] [place] [address]  Chief Complaint:  Change in bowel habits   History of Present Illness: [name] is a 82 y.o. male with PMH IDDM.  CAD.  Seizure disorder.  CVA.  COPD on 2 Liter NCO2 during day.  OSA on CPAP at night.  CKD stage 2.  who presents for evaluation of altered bowel movements and hematochezia  Patient reports that his bowel movements are altered with hard stools and liquid stools.  Now noticing mucus and blood in the stool.  Has fresh blood mixed with stool and also in the toilet bowl.  Patient reports diffuse abdominal pain mostly after food intake and slight improvement with defecation.  Patient reports he has to defecate multiple times a day especially after having any food. The patient denies having any nfever, chills,  melena, hematemesis, jaundice, pruritus or weight loss.  02/2012 colonoscopy with removal of 10 mm polyp (serrated adenoma) from rectosigmoid, small rectal polyp "coagulated" using snare tip.  Nonbleeding hemorrhoids below dentate line. 06/2017 colonoscopy.  Poor prep with stool from rectum to transverse colon precluding visualization.  Plan was to repeat colonoscopy with better prep but this has yet to occur.  FHx: neg for any gastrointestinal/liver disease, no malignancies Social: neg smoking, alcohol or illicit drug use Surgical: Appendectomy  Past Medical History: Past Medical History:  Diagnosis Date   Abnormality of gait 05/01/2015   Anemia 04/19/2015   Annual physical exam 12/11/2013   Arthritis    "left leg; knees" (05/07/2017)   Arthritis of knee    CAD (coronary artery disease)    CHF (congestive heart failure) (HCC)    Phreesia 08/08/2020   Choking 07/13/2015   Chronic bronchitis (HCC)    "get it q yr"    Chronic lower back pain    "since I was a teen" (05/07/2017)   COPD (chronic obstructive pulmonary disease) (HCC)    CVA (cerebral vascular accident) (HCC) 05/2015   R sided weakness (05/07/2017)   Diabetes mellitus without complication (HCC)    Phreesia 08/08/2020   GERD (gastroesophageal reflux disease)    Gout    History of colonic polyps 04/03/2017   Added automatically from request for surgery [id number]   Hyperlipidemia    Hypertension    Myocardial infarction (HCC) 1999   Obesity    OSA on CPAP    Oxygen deficiency    Phreesia 08/08/2020   Pneumonia    "a few times; last time was ~ 06/2013" (05/07/2017)   Psoriasis    Stroke (HCC)    Phreesia 08/08/2020   Thyroid disease    Phreesia 08/08/2020   Tussive syncope 05/01/2015   Type II diabetes mellitus (HCC)    Vitamin B12 deficiency 05/01/2015    Past Surgical History: Past Surgical History:  Procedure Laterality Date   APPENDECTOMY     CATARACT EXTRACTION W/PHACO Left 10/07/2016   Procedure: CATARACT EXTRACTION PHACO AND INTRAOCULAR LENS PLACEMENT (IOC);  Surgeon: Jethro Bolus, MD;  Location: [facility];  Service: Ophthalmology;  Laterality: Left;  CDE: 13.49   CATARACT EXTRACTION W/PHACO Right 10/28/2016   Procedure: CATARACT EXTRACTION PHACO AND INTRAOCULAR LENS PLACEMENT (IOC);  Surgeon: Jethro Bolus, MD;  Location: [facility];  Service: Ophthalmology;  Laterality: Right;  CDE: 16.71   COLONOSCOPY  03/17/2012   Procedure: COLONOSCOPY;  Surgeon:  Malissa Hippo, MD;  Location: [facility];  Service: Endoscopy;  Laterality: N/A;  830   COLONOSCOPY N/A 06/25/2017   Procedure: COLONOSCOPY;  Surgeon: Malissa Hippo, MD;  Location: [facility];  Service: Endoscopy;  Laterality: N/A;  930   CORONARY ANGIOPLASTY WITH STENT PLACEMENT  1999- 2009-08/30/2013   "counting today's, I have 5 stents" (08/30/2013)   CORONARY ANGIOPLASTY WITH STENT PLACEMENT  05/07/2017   CORONARY STENT INTERVENTION N/A 05/07/2017   Procedure: CORONARY  STENT INTERVENTION;  Surgeon: Rinaldo Cloud, MD;  Location: [facility] IN[facility];  Service: Cardiovascular;  Laterality: N/A;   CYSTOSCOPY WITH URETHRAL DILATATION N/A 11/26/2012   Procedure: CYSTOSCOPY WITH URETHRAL DILATATION;  Surgeon: Anner Crete, MD;  Location: [facility];  Service: Urology;  Laterality: N/A;   LEFT HEART CATH AND CORONARY ANGIOGRAPHY N/A 05/07/2017   Procedure: LEFT HEART CATH AND CORONARY ANGIOGRAPHY;  Surgeon: Rinaldo Cloud, MD;  Location: [facility] CV [facility];  Service: Cardiovascular;  Laterality: N/A;   LEFT HEART CATHETERIZATION WITH CORONARY ANGIOGRAM N/A 08/30/2013   Procedure: LEFT HEART CATHETERIZATION WITH CORONARY ANGIOGRAM;  Surgeon: Robynn Pane, MD;  Location: [facility] CATH LAB;  Service: Cardiovascular;  Laterality: N/A;   PERCUTANEOUS CORONARY STENT INTERVENTION (PCI-S) Right 08/30/2013   Procedure: PERCUTANEOUS CORONARY STENT INTERVENTION (PCI-S);  Surgeon: Robynn Pane, MD;  [facility] CATH LAB;  Service: Cardiovascular;  Laterality: Right;    Family History: Family History  Problem Relation Age of Onset   Hypertension Mother    Diabetes Mother    Hypertension Father    Diabetes Brother    Stroke Daughter    Arthritis Other        Family History    Diabetes Other        family History     Social History: Social History   Tobacco Use  Smoking Status Former   Current packs/day: 0.00   Average packs/day: 0.5 packs/day for 33.0 years (16.5 ttl pk-yrs)   Types: Cigarettes   Start date: 11/28/1969   Quit date: 11/29/2002   Years since quitting: 20.2  Smokeless Tobacco Never   Social History   Substance and Sexual Activity  Alcohol Use No   Social History   Substance and Sexual Activity  Drug Use No    Allergies: No Known Allergies  Medications: Current Outpatient Medications  Medication Sig Dispense Refill   acetaminophen (TYLENOL) 500 MG tablet Take 1,000 mg by mouth every 6 (six) hours as needed for moderate pain (left thigh).       albuterol (VENTOLIN HFA) 108 (90 Base) MCG/ACT inhaler INHALE 1 TO 2 PUFFS EVERY 6 HOURS AS NEEDED FOR WHEEZING OR SHORTNESS OF BREATH (Patient taking differently: Inhale 2 puffs into the lungs every 6 (six) hours as needed for shortness of breath or wheezing.) 1 each 10   allopurinol (ZYLOPRIM) 300 MG tablet TAKE 1 TABLET EVERY DAY 90 tablet 3   aspirin EC 81 MG tablet Take 81 mg by mouth daily.     Blood Glucose Calibration (TRUE METRIX LEVEL 1) Low SOLN Use as directed 1 each 0   Blood Glucose Monitoring Suppl (TRUE METRIX METER) w/Device KIT USE AS DIRECTED 1 kit 0   clobetasol ointment (TEMOVATE) 0.05 % Apply twice daily to affected areas for 14 days then once daily, as needed 69 g 1   clopidogrel (PLAVIX) 75 MG tablet TAKE 1 TABLET EVERY DAY 90 tablet 3   DROPLET INSULIN SYRINGE 31G X 5/16" 1 ML MISC USE  AS DIRECTED IN THE MORNING AND AT BEDTIME 100 each 0   ezetimibe (ZETIA) 10 MG tablet TAKE 1 TABLET EVERY DAY 90 tablet 10   glucose blood (TRUE METRIX BLOOD GLUCOSE TEST) test strip TEST BLOOD SUGAR FOUR TIMES DAILY 400 strip 3   insulin NPH-regular Human (NOVOLIN 70/30) (70-30) 100 UNIT/ML injection Inject 35-45 Units into the skin 2 (two) times daily with a meal. Take 45 units with breakfast and 35 units with supper if glucose is above 90 and eating. 90 mL 3   isosorbide mononitrate (IMDUR) 30 MG 24 hr tablet TAKE 1 TABLET EVERY DAY 90 tablet 3   levETIRAcetam (KEPPRA) 500 MG tablet TAKE 1 TABLET TWICE DAILY 180 tablet 10   levothyroxine (SYNTHROID) 112 MCG tablet Take 1 tablet (112 mcg total) by mouth daily. 90 tablet 3   metoprolol tartrate (LOPRESSOR) 50 MG tablet TAKE 1/2 TABLET TWICE DAILY 90 tablet 3   nitroGLYCERIN (NITROSTAT) 0.4 MG SL tablet Place 1 tablet (0.4 mg total) under the tongue every 5 (five) minutes as needed for chest pain. 100 tablet 3   pantoprazole (PROTONIX) 40 MG tablet TAKE 1 TABLET EVERY DAY BEFORE BREAKFAST (Patient taking differently: Take 40 mg by mouth daily.)  90 tablet 10   polyethylene glycol (MIRALAX / GLYCOLAX) 17 g packet Take 17 g by mouth daily as needed. 14 each 0   pravastatin (PRAVACHOL) 40 MG tablet TAKE 2 TABLETS EVERY DAY 180 tablet 10   Probiotic Product (ALIGN) 4 MG CAPS Take 1 capsule (4 mg total) by mouth daily. 30 capsule 0   spironolactone (ALDACTONE) 50 MG tablet TAKE 1 TABLET EVERY DAY (DOSE INCREASE) 90 tablet 3   TRUEplus Lancets 33G MISC TEST BLOOD SUGAR FOUR TIMES DAILY 400 each 0   UNABLE TO FIND CPAP mask and tubing  DX G47.33 1 each 0   Vitamin D, Ergocalciferol, (DRISDOL) 1.25 MG (50000 UNIT) CAPS capsule Take 1 capsule (50,000 Units total) by mouth every 7 (seven) days. 12 capsule 2   spironolactone (ALDACTONE) 25 MG tablet Take 12.5 mg by mouth daily. Take half a tablet by mouth daily (Patient not taking: Reported on 02/10/2023)     No current facility-administered medications for this visit.    Review of Systems: GENERAL: negative for malaise, night sweats HEENT: No changes in hearing or vision, no nose bleeds or other nasal problems. NECK: Negative for lumps, goiter, pain and significant neck swelling RESPIRATORY: Negative for cough, wheezing CARDIOVASCULAR: Negative for chest pain, leg swelling, palpitations, orthopnea GI: SEE HPI MUSCULOSKELETAL: Negative for joint pain or swelling, back pain, and muscle pain. SKIN: Negative for lesions, rash HEMATOLOGY Negative for prolonged bleeding, bruising easily, and swollen nodes. ENDOCRINE: Negative for cold or heat intolerance, polyuria, polydipsia and goiter. NEURO: negative for tremor, gait imbalance, syncope and seizures. The remainder of the review of systems is noncontributory.   Physical Exam: BP 139/75   Pulse 79   Temp 97.6 F (36.4 C) (Oral)   Ht 5\' 10"  (1.778 m)   Wt 248 lb 8 oz (112.7 kg)   BMI 35.66 kg/m  GENERAL: The patient is AO x3, in no acute distress. HEENT: Head is normocephalic and atraumatic. EOMI are intact. Mouth is well hydrated and  without lesions. NECK: Supple. No masses LUNGS: Clear to auscultation. No presence of rhonchi/wheezing/rales. Adequate chest expansion HEART: RRR, normal s1 and s2. ABDOMEN: Soft, nontender, no guarding, no peritoneal signs, and nondistended. BS +. No masses. EXTREMITIES: Without any cyanosis, clubbing, rash, lesions or  edema. NEUROLOGIC: AOx3, no focal motor deficit. SKIN: no jaundice, no rashes   Imaging/Labs: as above     Latest Ref Rng & Units 11/15/2022    5:56 PM 07/19/2022   12:28 AM 07/19/2022   12:22 AM  CBC  WBC 4.0 - 10.5 K/uL 12.0   10.5   Hemoglobin 13.0 - 17.0 g/dL 16.1  09.6  04.5   Hematocrit 39.0 - 52.0 % 34.0  36.0  37.1   Platelets 150 - 400 K/uL 258   246    Lab Results  Component Value Date   IRON 23 (L) 04/19/2015   TIBC 277 04/19/2015   FERRITIN 130 04/19/2015    I personally reviewed and interpreted the available labs, imaging and endoscopic files.  CT Abdomen 10/2022  02/2012 colonoscopy with removal of 10 mm polyp (serrated adenoma) from rectosigmoid, small rectal polyp "coagulated" using snare tip.  Nonbleeding hemorrhoids below dentate line. 06/2017 colonoscopy.  Poor prep with stool from rectum to transverse colon precluding visualization.  Plan was to repeat colonoscopy with better prep but this has yet to occur.  Impression and Plan:  [name] is a 82 y.o. male with PMH IDDM.  CAD.  Seizure disorder.  CVA.  COPD on 2 Liter NCO2 during day.  OSA on CPAP at night.  CKD stage 2.  who presents for evaluation of altered bowel movements and hematochezia  # Altered bowel movements # Chronic constipation  Patient has chronic constipation, recent CAT scan with large amount of stool burden, bowel movement alternating between liquid and hard stool is likely overflow diarrhea  Recs:  Ensure adequate fluid intake: Aim for 8 glasses of water daily. Follow a high fiber diet: Include foods such as dates, prunes, pears, and kiwi. Take Miralax twice a  day for the first week, then reduce to once daily thereafter. Use Metamucil twice a day.  # Hematochezia  Fresh blood per rectum could be likely from hemorrhoidal bleed but given advanced age last colonoscopy 2013 need to rule out underlying lesion such as polyp, inflammation and malignancy  Discussed with patient risk indication limitation and benefit of colonoscopy. He would like to proceed with colonoscopy with extended bowel prep, 2 days of GoLytely 4 L x 2 as previously he had poor prep in 2019  # Normocytic anemia Hemoglobin 10 MCV 90  Patient has chronic anemia dating at least since 2014 with hemoglobin between 10-12  Will obtain ferritin, B12 and folate levels. If iron deficient will need to have upper endoscopy in future  #BMI 36       - goal to lose 5-10% of initial body weight     - avoid suagry drinks and juices, use zero calorie beverages     - increase water intake     - eat a low carb diet with plenty of veggies and fruit     - Get sufficient sleep 7-8 hrs nightly     - maitain active lifestyle     - avoid alcohol     - recommend 2-3 cups Coffee daily     - Counsel on lowering cholesterol by having a diet rich in vegetables,          protein (avoid red meats) and good fats(fish, salmon).   All questions were answered.      Vista Lawman, MD Gastroenterology and Hepatology [facility] Gastroenterology   This chart has been completed using [facility] Dictation software, and while attempts have been made  to ensure accuracy , certain words and phrases may not be transcribed as intended

## 2023-02-10 NOTE — Progress Notes (Signed)
Vista Lawman , M.D. Gastroenterology & Hepatology [facility] Gastroenterology [address] [place], [place] [address] Primary Care Physician: Kerri Perches, MD [address] [place] [address]  Chief Complaint:  Change in bowel habits   History of Present Illness: [name] is a 82 y.o. male with PMH IDDM.  CAD.  Seizure disorder.  CVA.  COPD on 2 Liter NCO2 during day.  OSA on CPAP at night.  CKD stage 2.  who presents for evaluation of altered bowel movements and hematochezia  Patient reports that his bowel movements are altered with hard stools and liquid stools.  Now noticing mucus and blood in the stool.  Has fresh blood mixed with stool and also in the toilet bowl.  Patient reports diffuse abdominal pain mostly after food intake and slight improvement with defecation.  Patient reports he has to defecate multiple times a day especially after having any food. The patient denies having any nfever, chills,  melena, hematemesis, jaundice, pruritus or weight loss.  02/2012 colonoscopy with removal of 10 mm polyp (serrated adenoma) from rectosigmoid, small rectal polyp "coagulated" using snare tip.  Nonbleeding hemorrhoids below dentate line. 06/2017 colonoscopy.  Poor prep with stool from rectum to transverse colon precluding visualization.  Plan was to repeat colonoscopy with better prep but this has yet to occur.  FHx: neg for any gastrointestinal/liver disease, no malignancies Social: neg smoking, alcohol or illicit drug use Surgical: Appendectomy  Past Medical History: Past Medical History:  Diagnosis Date   Abnormality of gait 05/01/2015   Anemia 04/19/2015   Annual physical exam 12/11/2013   Arthritis    "left leg; knees" (05/07/2017)   Arthritis of knee    CAD (coronary artery disease)    CHF (congestive heart failure) (HCC)    Phreesia 08/08/2020   Choking 07/13/2015   Chronic bronchitis (HCC)    "get it q yr"    Chronic lower back pain    "since I was a teen" (05/07/2017)   COPD (chronic obstructive pulmonary disease) (HCC)    CVA (cerebral vascular accident) (HCC) 05/2015   R sided weakness (05/07/2017)   Diabetes mellitus without complication (HCC)    Phreesia 08/08/2020   GERD (gastroesophageal reflux disease)    Gout    History of colonic polyps 04/03/2017   Added automatically from request for surgery [id number]   Hyperlipidemia    Hypertension    Myocardial infarction (HCC) 1999   Obesity    OSA on CPAP    Oxygen deficiency    Phreesia 08/08/2020   Pneumonia    "a few times; last time was ~ 06/2013" (05/07/2017)   Psoriasis    Stroke (HCC)    Phreesia 08/08/2020   Thyroid disease    Phreesia 08/08/2020   Tussive syncope 05/01/2015   Type II diabetes mellitus (HCC)    Vitamin B12 deficiency 05/01/2015    Past Surgical History: Past Surgical History:  Procedure Laterality Date   APPENDECTOMY     CATARACT EXTRACTION W/PHACO Left 10/07/2016   Procedure: CATARACT EXTRACTION PHACO AND INTRAOCULAR LENS PLACEMENT (IOC);  Surgeon: Jethro Bolus, MD;  Location: [facility];  Service: Ophthalmology;  Laterality: Left;  CDE: 13.49   CATARACT EXTRACTION W/PHACO Right 10/28/2016   Procedure: CATARACT EXTRACTION PHACO AND INTRAOCULAR LENS PLACEMENT (IOC);  Surgeon: Jethro Bolus, MD;  Location: [facility];  Service: Ophthalmology;  Laterality: Right;  CDE: 16.71   COLONOSCOPY  03/17/2012   Procedure: COLONOSCOPY;  Surgeon:  Malissa Hippo, MD;  Location: [facility];  Service: Endoscopy;  Laterality: N/A;  830   COLONOSCOPY N/A 06/25/2017   Procedure: COLONOSCOPY;  Surgeon: Malissa Hippo, MD;  Location: [facility];  Service: Endoscopy;  Laterality: N/A;  930   CORONARY ANGIOPLASTY WITH STENT PLACEMENT  1999- 2009-08/30/2013   "counting today's, I have 5 stents" (08/30/2013)   CORONARY ANGIOPLASTY WITH STENT PLACEMENT  05/07/2017   CORONARY STENT INTERVENTION N/A 05/07/2017   Procedure: CORONARY  STENT INTERVENTION;  Surgeon: Rinaldo Cloud, MD;  Location: [facility];  Service: Cardiovascular;  Laterality: N/A;   CYSTOSCOPY WITH URETHRAL DILATATION N/A 11/26/2012   Procedure: CYSTOSCOPY WITH URETHRAL DILATATION;  Surgeon: Anner Crete, MD;  Location: [facility];  Service: Urology;  Laterality: N/A;   LEFT HEART CATH AND CORONARY ANGIOGRAPHY N/A 05/07/2017   Procedure: LEFT HEART CATH AND CORONARY ANGIOGRAPHY;  Surgeon: Rinaldo Cloud, MD;  Location: [facility];  Service: Cardiovascular;  Laterality: N/A;   LEFT HEART CATHETERIZATION WITH CORONARY ANGIOGRAM N/A 08/30/2013   Procedure: LEFT HEART CATHETERIZATION WITH CORONARY ANGIOGRAM;  Surgeon: Robynn Pane, MD;  Location: [facility] CATH [facility];  Service: Cardiovascular;  Laterality: N/A;   PERCUTANEOUS CORONARY STENT INTERVENTION (PCI-S) Right 08/30/2013   Procedure: PERCUTANEOUS CORONARY STENT INTERVENTION (PCI-S);  Surgeon: Robynn Pane, MD;  Location: [facility] CATH LAB;  Service: Cardiovascular;  Laterality: Right;    Family History: Family History  Problem Relation Age of Onset   Hypertension Mother    Diabetes Mother    Hypertension Father    Diabetes Brother    Stroke Daughter    Arthritis Other        Family History    Diabetes Other        family History     Social History: Social History   Tobacco Use  Smoking Status Former   Current packs/day: 0.00   Average packs/day: 0.5 packs/day for 33.0 years (16.5 ttl pk-yrs)   Types: Cigarettes   Start date: 11/28/1969   Quit date: 11/29/2002   Years since quitting: 20.2  Smokeless Tobacco Never   Social History   Substance and Sexual Activity  Alcohol Use No   Social History   Substance and Sexual Activity  Drug Use No    Allergies: No Known Allergies  Medications: Current Outpatient Medications  Medication Sig Dispense Refill   acetaminophen (TYLENOL) 500 MG tablet Take 1,000 mg by mouth every 6 (six) hours as needed for moderate pain (left thigh).       albuterol (VENTOLIN HFA) 108 (90 Base) MCG/ACT inhaler INHALE 1 TO 2 PUFFS EVERY 6 HOURS AS NEEDED FOR WHEEZING OR SHORTNESS OF BREATH (Patient taking differently: Inhale 2 puffs into the lungs every 6 (six) hours as needed for shortness of breath or wheezing.) 1 each 10   allopurinol (ZYLOPRIM) 300 MG tablet TAKE 1 TABLET EVERY DAY 90 tablet 3   aspirin EC 81 MG tablet Take 81 mg by mouth daily.     Blood Glucose Calibration (TRUE METRIX LEVEL 1) Low SOLN Use as directed 1 each 0   Blood Glucose Monitoring Suppl (TRUE METRIX METER) w/Device KIT USE AS DIRECTED 1 kit 0   clobetasol ointment (TEMOVATE) 0.05 % Apply twice daily to affected areas for 14 days then once daily, as needed 69 g 1   clopidogrel (PLAVIX) 75 MG tablet TAKE 1 TABLET EVERY DAY 90 tablet 3   DROPLET INSULIN SYRINGE 31G X 5/16" 1 ML MISC USE  AS DIRECTED IN THE MORNING AND AT BEDTIME 100 each 0   ezetimibe (ZETIA) 10 MG tablet TAKE 1 TABLET EVERY DAY 90 tablet 10   glucose blood (TRUE METRIX BLOOD GLUCOSE TEST) test strip TEST BLOOD SUGAR FOUR TIMES DAILY 400 strip 3   insulin NPH-regular Human (NOVOLIN 70/30) (70-30) 100 UNIT/ML injection Inject 35-45 Units into the skin 2 (two) times daily with a meal. Take 45 units with breakfast and 35 units with supper if glucose is above 90 and eating. 90 mL 3   isosorbide mononitrate (IMDUR) 30 MG 24 hr tablet TAKE 1 TABLET EVERY DAY 90 tablet 3   levETIRAcetam (KEPPRA) 500 MG tablet TAKE 1 TABLET TWICE DAILY 180 tablet 10   levothyroxine (SYNTHROID) 112 MCG tablet Take 1 tablet (112 mcg total) by mouth daily. 90 tablet 3   metoprolol tartrate (LOPRESSOR) 50 MG tablet TAKE 1/2 TABLET TWICE DAILY 90 tablet 3   nitroGLYCERIN (NITROSTAT) 0.4 MG SL tablet Place 1 tablet (0.4 mg total) under the tongue every 5 (five) minutes as needed for chest pain. 100 tablet 3   pantoprazole (PROTONIX) 40 MG tablet TAKE 1 TABLET EVERY DAY BEFORE BREAKFAST (Patient taking differently: Take 40 mg by mouth daily.)  90 tablet 10   polyethylene glycol (MIRALAX / GLYCOLAX) 17 g packet Take 17 g by mouth daily as needed. 14 each 0   pravastatin (PRAVACHOL) 40 MG tablet TAKE 2 TABLETS EVERY DAY 180 tablet 10   Probiotic Product (ALIGN) 4 MG CAPS Take 1 capsule (4 mg total) by mouth daily. 30 capsule 0   spironolactone (ALDACTONE) 50 MG tablet TAKE 1 TABLET EVERY DAY (DOSE INCREASE) 90 tablet 3   TRUEplus Lancets 33G MISC TEST BLOOD SUGAR FOUR TIMES DAILY 400 each 0   UNABLE TO FIND CPAP mask and tubing  DX G47.33 1 each 0   Vitamin D, Ergocalciferol, (DRISDOL) 1.25 MG (50000 UNIT) CAPS capsule Take 1 capsule (50,000 Units total) by mouth every 7 (seven) days. 12 capsule 2   spironolactone (ALDACTONE) 25 MG tablet Take 12.5 mg by mouth daily. Take half a tablet by mouth daily (Patient not taking: Reported on 02/10/2023)     No current facility-administered medications for this visit.    Review of Systems: GENERAL: negative for malaise, night sweats HEENT: No changes in hearing or vision, no nose bleeds or other nasal problems. NECK: Negative for lumps, goiter, pain and significant neck swelling RESPIRATORY: Negative for cough, wheezing CARDIOVASCULAR: Negative for chest pain, leg swelling, palpitations, orthopnea GI: SEE HPI MUSCULOSKELETAL: Negative for joint pain or swelling, back pain, and muscle pain. SKIN: Negative for lesions, rash HEMATOLOGY Negative for prolonged bleeding, bruising easily, and swollen nodes. ENDOCRINE: Negative for cold or heat intolerance, polyuria, polydipsia and goiter. NEURO: negative for tremor, gait imbalance, syncope and seizures. The remainder of the review of systems is noncontributory.   Physical Exam: BP 139/75   Pulse 79   Temp 97.6 F (36.4 C) (Oral)   Ht 5\' 10"  (1.778 m)   Wt 248 lb 8 oz (112.7 kg)   BMI 35.66 kg/m  GENERAL: The patient is AO x3, in no acute distress. HEENT: Head is normocephalic and atraumatic. EOMI are intact. Mouth is well hydrated and  without lesions. NECK: Supple. No masses LUNGS: Clear to auscultation. No presence of rhonchi/wheezing/rales. Adequate chest expansion HEART: RRR, normal s1 and s2. ABDOMEN: Soft, nontender, no guarding, no peritoneal signs, and nondistended. BS +. No masses. EXTREMITIES: Without any cyanosis, clubbing, rash, lesions or  edema. NEUROLOGIC: AOx3, no focal motor deficit. SKIN: no jaundice, no rashes   Imaging/Labs: as above     Latest Ref Rng & Units 11/15/2022    5:56 PM 07/19/2022   12:28 AM 07/19/2022   12:22 AM  CBC  WBC 4.0 - 10.5 K/uL 12.0   10.5   Hemoglobin 13.0 - 17.0 g/dL 16.1  09.6  04.5   Hematocrit 39.0 - 52.0 % 34.0  36.0  37.1   Platelets 150 - 400 K/uL 258   246    Lab Results  Component Value Date   IRON 23 (L) 04/19/2015   TIBC 277 04/19/2015   FERRITIN 130 04/19/2015    I personally reviewed and interpreted the available labs, imaging and endoscopic files.  CT Abdomen 10/2022  02/2012 colonoscopy with removal of 10 mm polyp (serrated adenoma) from rectosigmoid, small rectal polyp "coagulated" using snare tip.  Nonbleeding hemorrhoids below dentate line. 06/2017 colonoscopy.  Poor prep with stool from rectum to transverse colon precluding visualization.  Plan was to repeat colonoscopy with better prep but this has yet to occur.  Impression and Plan:  [name] is a 82 y.o. male with PMH IDDM.  CAD.  Seizure disorder.  CVA.  COPD on 2 Liter NCO2 during day.  OSA on CPAP at night.  CKD stage 2.  who presents for evaluation of altered bowel movements and hematochezia  # Altered bowel movements # Chronic constipation  Patient has chronic constipation, recent CAT scan with large amount of stool burden, bowel movement alternating between liquid and hard stool is likely overflow diarrhea  Recs:  Ensure adequate fluid intake: Aim for 8 glasses of water daily. Follow a high fiber diet: Include foods such as dates, prunes, pears, and kiwi. Take Miralax twice a  day for the first week, then reduce to once daily thereafter. Use Metamucil twice a day.  # Hematochezia  Fresh blood per rectum could be likely from hemorrhoidal bleed but given advanced age last colonoscopy 2013 need to rule out underlying lesion such as polyp, inflammation and malignancy  Discussed with patient risk indication limitation and benefit of colonoscopy. He would like to proceed with colonoscopy with extended bowel prep, 2 days of GoLytely 4 L x 2 as previously he had poor prep in 2019  # Normocytic anemia Hemoglobin 10 MCV 90  Patient has chronic anemia dating at least since 2014 with hemoglobin between 10-12  Will obtain ferritin, B12 and folate levels. If iron deficient will need to have upper endoscopy in future  #BMI 36       - goal to lose 5-10% of initial body weight     - avoid suagry drinks and juices, use zero calorie beverages     - increase water intake     - eat a low carb diet with plenty of veggies and fruit     - Get sufficient sleep 7-8 hrs nightly     - maitain active lifestyle     - avoid alcohol     - recommend 2-3 cups Coffee daily     - Counsel on lowering cholesterol by having a diet rich in vegetables,          protein (avoid red meats) and good fats(fish, salmon).   All questions were answered.      Vista Lawman, MD Gastroenterology and Hepatology [facility] Gastroenterology   This chart has been completed using [facility] Dictation software, and while attempts have been made  to ensure accuracy , certain words and phrases may not be transcribed as intended

## 2023-02-16 ENCOUNTER — Encounter (INDEPENDENT_AMBULATORY_CARE_PROVIDER_SITE_OTHER): Payer: Self-pay

## 2023-02-16 ENCOUNTER — Ambulatory Visit: Payer: Medicare HMO

## 2023-03-03 ENCOUNTER — Telehealth: Payer: Self-pay | Admitting: Family Medicine

## 2023-03-03 NOTE — Telephone Encounter (Signed)
Spouse called in on patient behalf. Needs orders sent ion to [facility] for Lift chair with supporting documents

## 2023-03-10 ENCOUNTER — Encounter (HOSPITAL_COMMUNITY): Payer: Self-pay

## 2023-03-10 ENCOUNTER — Encounter (HOSPITAL_COMMUNITY)
Admission: RE | Admit: 2023-03-10 | Discharge: 2023-03-10 | Disposition: A | Discharge status: Home / Self Care | Payer: Medicare HMO | Source: Ambulatory Visit | Attending: Gastroenterology | Admitting: Gastroenterology

## 2023-03-10 ENCOUNTER — Other Ambulatory Visit: Payer: Self-pay

## 2023-03-10 NOTE — Pre-Procedure Instructions (Signed)
Spoke to patient for pre-op phone call and he took his plavix this morning. Dr Helayne Seminole to see if he wants to proceed or reschedule.

## 2023-03-11 ENCOUNTER — Other Ambulatory Visit (INDEPENDENT_AMBULATORY_CARE_PROVIDER_SITE_OTHER): Payer: Self-pay

## 2023-03-11 MED ORDER — PEG 3350-KCL-NA BICARB-NACL 420 G PO SOLR: 4000.0000 mL | Freq: Once | ORAL | 0 refills | Status: AC

## 2023-03-12 ENCOUNTER — Encounter (HOSPITAL_COMMUNITY): Payer: Self-pay

## 2023-03-12 ENCOUNTER — Encounter (HOSPITAL_COMMUNITY)
Admission: RE | Disposition: A | Discharge status: Home / Self Care | Payer: Self-pay | Source: Home / Self Care | Attending: Gastroenterology

## 2023-03-12 ENCOUNTER — Ambulatory Visit (HOSPITAL_COMMUNITY)
Admission: RE | Admit: 2023-03-12 | Discharge: 2023-03-12 | Disposition: A | Discharge status: Home / Self Care | Payer: Medicare HMO | Attending: Gastroenterology | Admitting: Gastroenterology

## 2023-03-12 ENCOUNTER — Ambulatory Visit (HOSPITAL_COMMUNITY): Payer: Medicare HMO | Admitting: Anesthesiology

## 2023-03-12 DIAGNOSIS — D123 Benign neoplasm of transverse colon: Secondary | ICD-10-CM | POA: Insufficient documentation

## 2023-03-12 DIAGNOSIS — J449 Chronic obstructive pulmonary disease, unspecified: Secondary | ICD-10-CM | POA: Insufficient documentation

## 2023-03-12 DIAGNOSIS — Z7982 Long term (current) use of aspirin: Secondary | ICD-10-CM | POA: Insufficient documentation

## 2023-03-12 DIAGNOSIS — Z7902 Long term (current) use of antithrombotics/antiplatelets: Secondary | ICD-10-CM | POA: Diagnosis not present

## 2023-03-12 DIAGNOSIS — D125 Benign neoplasm of sigmoid colon: Secondary | ICD-10-CM

## 2023-03-12 DIAGNOSIS — E1122 Type 2 diabetes mellitus with diabetic chronic kidney disease: Secondary | ICD-10-CM | POA: Insufficient documentation

## 2023-03-12 DIAGNOSIS — I509 Heart failure, unspecified: Secondary | ICD-10-CM | POA: Insufficient documentation

## 2023-03-12 DIAGNOSIS — D126 Benign neoplasm of colon, unspecified: Secondary | ICD-10-CM | POA: Diagnosis not present

## 2023-03-12 DIAGNOSIS — K648 Other hemorrhoids: Secondary | ICD-10-CM | POA: Insufficient documentation

## 2023-03-12 DIAGNOSIS — Z794 Long term (current) use of insulin: Secondary | ICD-10-CM | POA: Insufficient documentation

## 2023-03-12 DIAGNOSIS — G40909 Epilepsy, unspecified, not intractable, without status epilepticus: Secondary | ICD-10-CM | POA: Insufficient documentation

## 2023-03-12 DIAGNOSIS — Z9981 Dependence on supplemental oxygen: Secondary | ICD-10-CM | POA: Insufficient documentation

## 2023-03-12 DIAGNOSIS — Z833 Family history of diabetes mellitus: Secondary | ICD-10-CM | POA: Insufficient documentation

## 2023-03-12 DIAGNOSIS — K921 Melena: Secondary | ICD-10-CM | POA: Diagnosis not present

## 2023-03-12 DIAGNOSIS — K5730 Diverticulosis of large intestine without perforation or abscess without bleeding: Secondary | ICD-10-CM | POA: Diagnosis not present

## 2023-03-12 DIAGNOSIS — K644 Residual hemorrhoidal skin tags: Secondary | ICD-10-CM | POA: Diagnosis not present

## 2023-03-12 DIAGNOSIS — Z87891 Personal history of nicotine dependence: Secondary | ICD-10-CM | POA: Diagnosis not present

## 2023-03-12 DIAGNOSIS — Z79890 Hormone replacement therapy (postmenopausal): Secondary | ICD-10-CM | POA: Insufficient documentation

## 2023-03-12 DIAGNOSIS — G4730 Sleep apnea, unspecified: Secondary | ICD-10-CM | POA: Diagnosis not present

## 2023-03-12 DIAGNOSIS — G4733 Obstructive sleep apnea (adult) (pediatric): Secondary | ICD-10-CM | POA: Insufficient documentation

## 2023-03-12 DIAGNOSIS — E039 Hypothyroidism, unspecified: Secondary | ICD-10-CM | POA: Diagnosis not present

## 2023-03-12 DIAGNOSIS — N182 Chronic kidney disease, stage 2 (mild): Secondary | ICD-10-CM | POA: Diagnosis not present

## 2023-03-12 DIAGNOSIS — K5140 Inflammatory polyps of colon without complications: Secondary | ICD-10-CM

## 2023-03-12 DIAGNOSIS — E1121 Type 2 diabetes mellitus with diabetic nephropathy: Secondary | ICD-10-CM

## 2023-03-12 DIAGNOSIS — I130 Hypertensive heart and chronic kidney disease with heart failure and stage 1 through stage 4 chronic kidney disease, or unspecified chronic kidney disease: Secondary | ICD-10-CM | POA: Diagnosis not present

## 2023-03-12 DIAGNOSIS — Z8673 Personal history of transient ischemic attack (TIA), and cerebral infarction without residual deficits: Secondary | ICD-10-CM | POA: Diagnosis not present

## 2023-03-12 DIAGNOSIS — I2510 Atherosclerotic heart disease of native coronary artery without angina pectoris: Secondary | ICD-10-CM | POA: Insufficient documentation

## 2023-03-12 DIAGNOSIS — Z79899 Other long term (current) drug therapy: Secondary | ICD-10-CM | POA: Insufficient documentation

## 2023-03-12 DIAGNOSIS — K5909 Other constipation: Secondary | ICD-10-CM | POA: Diagnosis not present

## 2023-03-12 DIAGNOSIS — Z860101 Personal history of adenomatous and serrated colon polyps: Secondary | ICD-10-CM | POA: Insufficient documentation

## 2023-03-12 DIAGNOSIS — E079 Disorder of thyroid, unspecified: Secondary | ICD-10-CM | POA: Insufficient documentation

## 2023-03-12 DIAGNOSIS — K635 Polyp of colon: Secondary | ICD-10-CM | POA: Diagnosis not present

## 2023-03-12 DIAGNOSIS — K625 Hemorrhage of anus and rectum: Secondary | ICD-10-CM

## 2023-03-12 DIAGNOSIS — K219 Gastro-esophageal reflux disease without esophagitis: Secondary | ICD-10-CM | POA: Insufficient documentation

## 2023-03-12 HISTORY — PX: POLYPECTOMY: SHX5525

## 2023-03-12 HISTORY — PX: COLONOSCOPY WITH PROPOFOL: SHX5780

## 2023-03-12 LAB — HM COLONOSCOPY

## 2023-03-12 LAB — GLUCOSE, CAPILLARY: Glucose-Capillary: 99 mg/dL (ref 70–99)

## 2023-03-12 SURGERY — COLONOSCOPY WITH PROPOFOL
Anesthesia: General

## 2023-03-12 MED ORDER — PHENYLEPHRINE 80 MCG/ML (10ML) SYRINGE FOR IV PUSH (FOR BLOOD PRESSURE SUPPORT)
PREFILLED_SYRINGE | INTRAVENOUS | Status: DC | PRN
Start: 2023-03-12 — End: 2023-03-12
  Administered 2023-03-12 (×2): 160 ug via INTRAVENOUS

## 2023-03-12 MED ORDER — PROPOFOL 10 MG/ML IV BOLUS
INTRAVENOUS | Status: DC | PRN
  Administered 2023-03-12: 50 mg via INTRAVENOUS

## 2023-03-12 MED ORDER — LIDOCAINE HCL (CARDIAC) PF 100 MG/5ML IV SOSY
PREFILLED_SYRINGE | INTRAVENOUS | Status: DC | PRN
  Administered 2023-03-12: 80 mg via INTRAVENOUS

## 2023-03-12 MED ORDER — PROPOFOL 1000 MG/100ML IV EMUL
INTRAVENOUS | Status: AC
Start: 2023-03-12 — End: ?
  Filled 2023-03-12: qty 100

## 2023-03-12 MED ORDER — PROPOFOL 500 MG/50ML IV EMUL
INTRAVENOUS | Status: DC | PRN
  Administered 2023-03-12: 125 ug/kg/min via INTRAVENOUS

## 2023-03-12 MED ORDER — PHENYLEPHRINE 80 MCG/ML (10ML) SYRINGE FOR IV PUSH (FOR BLOOD PRESSURE SUPPORT)
PREFILLED_SYRINGE | INTRAVENOUS | Status: AC
Start: 2023-03-12 — End: ?
  Filled 2023-03-12: qty 10

## 2023-03-12 MED ORDER — LACTATED RINGERS IV SOLN: INTRAVENOUS | Status: DC | PRN

## 2023-03-12 NOTE — Interval H&P Note (Signed)
History and Physical Interval Note:  03/12/2023 8:50 AM  [name]  has presented today for surgery, with the diagnosis of hematochezia.  The various methods of treatment have been discussed with the patient and family. After consideration of risks, benefits and other options for treatment, the patient has consented to  Procedure(s) with comments: COLONOSCOPY WITH PROPOFOL (N/A) - 9:30am;asa 3 as a surgical intervention.  The patient's history has been reviewed, patient examined, no change in status, stable for surgery.  I have reviewed the patient's chart and labs.  Questions were answered to the patient's satisfaction.     Juanetta Beets Solace Wendorff

## 2023-03-12 NOTE — Transfer of Care (Addendum)
Immediate Anesthesia Transfer of Care Note  Patient: PIUS BULSON  Procedure(s) Performed: COLONOSCOPY WITH PROPOFOL POLYPECTOMY  Patient Location: Short Stay  Anesthesia Type:General  Level of Consciousness: drowsy and patient cooperative  Airway & Oxygen Therapy: Patient Spontanous Breathing and Patient connected to nasal cannula oxygen  Post-op Assessment: Report given to RN and Post -op Vital signs reviewed and stable  Post vital signs: Reviewed and stable  Last Vitals:  Vitals Value Taken Time  BP 126/73 03/12/23   1043  Temp 36.3 C 03/12/23 1043  Pulse 84 03/12/23 1043  Resp 20 03/12/23 1043  SpO2 99 % 03/12/23 1043    Last Pain:  Vitals:   03/12/23 1043  TempSrc: Oral  PainSc: 0-No pain      Patients Stated Pain Goal: 4 (03/12/23 1043)  Complications: No notable events documented.

## 2023-03-12 NOTE — Anesthesia Preprocedure Evaluation (Addendum)
Anesthesia Evaluation  Patient identified by MRN, date of birth, ID band Patient awake    Reviewed: Allergy & Precautions, NPO status , Patient's Chart, lab work & pertinent test results  Airway Mallampati: IV  TM Distance: >3 FB Neck ROM: Full    Dental  (+) Edentulous Upper, Partial Lower, Dental Advisory Given   Pulmonary sleep apnea and Continuous Positive Airway Pressure Ventilation , COPD,  oxygen dependent, former smoker   Pulmonary exam normal breath sounds clear to auscultation       Cardiovascular hypertension, Pt. on medications and Pt. on home beta blockers (-) angina + CAD, + Past MI, + Cardiac Stents and +CHF  Normal cardiovascular exam Rhythm:Regular Rate:Normal     Neuro/Psych  Neuromuscular disease CVA    GI/Hepatic ,GERD  Medicated and Controlled,Patient received Oral Contrast Agents,  Endo/Other  diabetes, Type 2Hypothyroidism  Morbid obesity  Renal/GU Renal disease     Musculoskeletal   Abdominal   Peds  Hematology  (+) Blood dyscrasia, anemia   Anesthesia Other Findings   Reproductive/Obstetrics                             Anesthesia Physical Anesthesia Plan  ASA: 4  Anesthesia Plan: General   Post-op Pain Management: Minimal or no pain anticipated   Induction: Intravenous  PONV Risk Score and Plan: Propofol infusion  Airway Management Planned: Natural Airway and Simple Face Mask  Additional Equipment: None  Intra-op Plan:   Post-operative Plan:   Informed Consent: I have reviewed the patients History and Physical, chart, labs and discussed the procedure including the risks, benefits and alternatives for the proposed anesthesia with the patient or authorized representative who has indicated his/her understanding and acceptance.       Plan Discussed with: CRNA  Anesthesia Plan Comments:         Anesthesia Quick Evaluation

## 2023-03-12 NOTE — Anesthesia Postprocedure Evaluation (Signed)
Anesthesia Post Note  Patient: [name]  Procedure(s) Performed: COLONOSCOPY WITH PROPOFOL POLYPECTOMY  Patient location during evaluation: PACU Anesthesia Type: General Level of consciousness: awake and alert Pain management: pain level controlled Vital Signs Assessment: post-procedure vital signs reviewed and stable Respiratory status: spontaneous breathing, nonlabored ventilation, respiratory function stable and patient connected to nasal cannula oxygen Cardiovascular status: blood pressure returned to baseline and stable Postop Assessment: no apparent nausea or vomiting Anesthetic complications: no   There were no known notable events for this encounter.   Last Vitals:  Vitals:   03/12/23 1043 03/12/23 1047  BP:  126/73  Pulse: 84 83  Resp: 20 18  Temp: (!) 36.3 C   SpO2: 99% 100%    Last Pain:  Vitals:   03/12/23 1047  TempSrc:   PainSc: 0-No pain                 Joeseph Verville L Bronte Kropf

## 2023-03-12 NOTE — Op Note (Signed)
[facility] Patient Name: [name] Procedure Date: 03/12/2023 9:31 AM MRN: [id number] Date of Birth: Apr 07, 1941 Attending MD: Sanjuan Dame , MD, [id number] CSN: [id number] Age: 82 Admit Type: Outpatient Procedure:                Colonoscopy Indications:              Hematochezia Providers:                Sanjuan Dame, MD, Sheran Fava, Francoise Ceo                            RN, RN, Elinor Parkinson Referring MD:              Medicines:                Monitored Anesthesia Care Complications:            No immediate complications. Estimated Blood Loss:     Estimated blood loss was minimal. Procedure:                Pre-Anesthesia Assessment:                           - Prior to the procedure, a History and Physical                            was performed, and patient medications and                            allergies were reviewed. The patient's tolerance of                            previous anesthesia was also reviewed. The risks                            and benefits of the procedure and the sedation                            options and risks were discussed with the patient.                            All questions were answered, and informed consent                            was obtained. Prior Anticoagulants: The patient has                            taken Plavix (clopidogrel), last dose was 5 days                            prior to procedure. ASA Grade Assessment: III - A                            patient with severe systemic disease. After  reviewing the risks and benefits, the patient was                            deemed in satisfactory condition to undergo the                            procedure.                           After obtaining informed consent, the colonoscope                            was passed under direct vision. Throughout the                            procedure, the patient's blood pressure, pulse,  and                            oxygen saturations were monitored continuously. The                            [id number]) scope was introduced through the                            anus and advanced to the the cecum, identified by                            appendiceal orifice and ileocecal valve. The                            colonoscopy was somewhat difficult due to                            significant looping. Successful completion of the                            procedure was aided by using manual pressure. Scope In: 9:58:11 AM Scope Out: 10:34:46 AM Scope Withdrawal Time: 0 hours 26 minutes 18 seconds  Total Procedure Duration: 0 hours 36 minutes 35 seconds  Findings:      Five sessile polyps were found in the transverse colon and hepatic       flexure. The polyps were 3 to 7 mm in size. These polyps were removed       with a cold snare. Resection and retrieval were complete.      Scattered diverticula were found in the left colon.      Non-bleeding external and internal hemorrhoids were found during       retroflexion. The hemorrhoids were medium-sized. Impression:               - Five 3 to 7 mm polyps in the transverse colon and                            at the hepatic flexure, removed with a cold snare.  Resected and retrieved.                           - Diverticulosis in the left colon.                           - Non-bleeding external and internal hemorrhoids. Moderate Sedation:      Per Anesthesia Care Recommendation:           - Patient has a contact number available for                            emergencies. The signs and symptoms of potential                            delayed complications were discussed with the                            patient. Return to normal activities tomorrow.                            Written discharge instructions were provided to the                            patient.                           -  Resume previous diet.                           - Continue present medications.                           - Await pathology results.                           - No repeat colonoscopy due to current age (71                            years or older).                           - Return to primary care physician as previously                            scheduled. Procedure Code(s):        --- Professional ---                           [phone], Colonoscopy, flexible; with removal of                            tumor(s), polyp(s), or other lesion(s) by snare                            technique Diagnosis Code(s):        --- Professional ---  D12.3, Benign neoplasm of transverse colon (hepatic                            flexure or splenic flexure)                           K64.8, Other hemorrhoids                           K92.1, Melena (includes Hematochezia)                           K57.30, Diverticulosis of large intestine without                            perforation or abscess without bleeding CPT copyright 2022 American Medical Association. All rights reserved. The codes documented in this report are preliminary and upon coder review may  be revised to meet current compliance requirements. Sanjuan Dame, MD Sanjuan Dame, MD 03/12/2023 10:40:14 AM This report has been signed electronically. Number of Addenda: 0

## 2023-03-12 NOTE — Discharge Instructions (Addendum)

## 2023-03-13 ENCOUNTER — Encounter (INDEPENDENT_AMBULATORY_CARE_PROVIDER_SITE_OTHER): Payer: Self-pay | Admitting: *Deleted

## 2023-03-14 ENCOUNTER — Other Ambulatory Visit: Payer: Self-pay | Admitting: Family Medicine

## 2023-03-16 ENCOUNTER — Ambulatory Visit (INDEPENDENT_AMBULATORY_CARE_PROVIDER_SITE_OTHER): Payer: Medicare HMO

## 2023-03-16 DIAGNOSIS — Z794 Long term (current) use of insulin: Secondary | ICD-10-CM

## 2023-03-16 DIAGNOSIS — E1121 Type 2 diabetes mellitus with diabetic nephropathy: Secondary | ICD-10-CM | POA: Diagnosis not present

## 2023-03-16 LAB — SURGICAL PATHOLOGY

## 2023-03-16 LAB — HM DIABETES EYE EXAM

## 2023-03-16 NOTE — Progress Notes (Signed)
[name] arrived 03/16/2023 and has given verbal consent to obtain images and complete their overdue diabetic retinal screening.  The images have been sent to an ophthalmologist or optometrist for review and interpretation.  Results will be sent back to Kerri Perches, MD for review.  Patient has been informed they will be contacted when we receive the results via telephone or MyChart   ;

## 2023-03-16 NOTE — Progress Notes (Signed)
I reviewed the pathology results. [name], can you send her a letter with the findings as described below please?  No Repeat screening colonoscopy.  Thanks,  Vista Lawman, MD Gastroenterology and Hepatology [facility] Gastroenterology  ---------------------------------------------------------------------------------------------  [facility]enterology [address], [address], [place], [place] [address] Phone:  [phone]   03/16/23 [name], [place]   Dear [name],  I am writing to inform you that the biopsies taken during your recent endoscopic examination showed:   I am writing to let you know the results of your recent colonoscopy.  You had a total of 5 polyps removed. The pathology came back as "tubular adenoma and inflammatory polyps." These findings are NOT cancer, but had the polyps remained in your colon, they could have turned into cancer.  Given these findings, it is recommended that your next colonoscopy be performed in 5 years but at that time you will be 82 years old  and As per [place]  Multi-Society Task Force on Colorectal Cancer recommendation;   screening colonoscopy is not recommended after age 46.  Please call us at [phone] if you have persistent problems or have questions about your condition that have not been fully answered at this time.  Sincerely,  Vista Lawman, MD Gastroenterology and Hepatology

## 2023-03-17 ENCOUNTER — Encounter (INDEPENDENT_AMBULATORY_CARE_PROVIDER_SITE_OTHER): Payer: Self-pay | Admitting: *Deleted

## 2023-03-18 ENCOUNTER — Encounter (HOSPITAL_COMMUNITY): Payer: Self-pay | Admitting: Gastroenterology

## 2023-03-25 ENCOUNTER — Other Ambulatory Visit: Payer: Self-pay | Admitting: Family Medicine

## 2023-04-09 ENCOUNTER — Encounter (INDEPENDENT_AMBULATORY_CARE_PROVIDER_SITE_OTHER): Payer: Self-pay | Admitting: Gastroenterology

## 2023-04-09 ENCOUNTER — Ambulatory Visit (INDEPENDENT_AMBULATORY_CARE_PROVIDER_SITE_OTHER): Payer: Medicare HMO | Admitting: Gastroenterology

## 2023-04-09 VITALS — BP 164/81 | HR 89 | Temp 97.5°F | Ht 70.0 in | Wt 248.6 lb

## 2023-04-09 DIAGNOSIS — D649 Anemia, unspecified: Secondary | ICD-10-CM

## 2023-04-09 DIAGNOSIS — R197 Diarrhea, unspecified: Secondary | ICD-10-CM

## 2023-04-09 DIAGNOSIS — R195 Other fecal abnormalities: Secondary | ICD-10-CM

## 2023-04-09 DIAGNOSIS — K529 Noninfective gastroenteritis and colitis, unspecified: Secondary | ICD-10-CM | POA: Insufficient documentation

## 2023-04-09 NOTE — Patient Instructions (Signed)
Perform blood and stool workup Please start Benefiber 1-2 tablespoons per day (mix with water)

## 2023-04-09 NOTE — Progress Notes (Signed)
Katrinka Blazing, M.D. Gastroenterology & Hepatology [facility] Gastroenterology [address] [place], [place] [address]  Primary Care Physician: Kerri Perches, MD [address] [place] [address]  I will communicate my assessment and recommendations to the referring MD via EMR.   History of Present Illness: [name] is a 82 y.o. male with past medical history of coronary artery disease, CHF, COPD, CVA, seizure disorder, OSA, diabetes, hypertension, hyperlipidemia, who presents for evaluation of diarrhea .  The patient was last seen on 02/10/2023 by Dr. Tasia Catchings (primary gastroenterologist). At that time, the patient was advised to implement a high-fiber diet and to take MiraLAX, as well as Metamucil.  Patient was found to have normocytic anemia with hemoglobin of 10 and MCV of 90.  Iron stores were ordered, as well as B12 and folate levels but these were never collected. Was scheduled to proceed with colonoscopy with findings described below.  The patient comes to the office with his wife.  Patient reports that he has noticed that he is still having issues with diarrhea, as he is having multiple bowel movements per day, close to 4-5 Bms per day. Stools are usually watery. States sometimes the stool comes out "as pus". He usually does not have to wake up to have a bowel movement in the middle of the night. He reports having frequent rumbling in his abdomen, which lets him know that he has to go to have a BM. No melena or hematochezia. He did not try Metamucil.  He tried taking MiraLAX for a couple of days but stopped as he was having multiple bowel movements per day.  The patient denies having any nausea, vomiting, fever, chills, hematochezia, melena, hematemesis, abdominal distention, abdominal pain, jaundice, pruritus or weight loss.  Last Colonoscopy: 03/12/2023 5 polyps between 3 to 7 mm were removed from the transverse colon and hepatic  flexure with a cold snare, diverticulosis in the left colon, internal and external hemorrhoids were seen.  Polyps were tubular adenomas and 1 inflammatory polyp.  Given age, no repeat colonoscopy was recommended.  Past Medical History: Past Medical History:  Diagnosis Date   Abnormality of gait 05/01/2015   Anemia 04/19/2015   Annual physical exam 12/11/2013   Arthritis    "left leg; knees" (05/07/2017)   Arthritis of knee    CAD (coronary artery disease)    CHF (congestive heart failure) (HCC)    Phreesia 08/08/2020   Choking 07/13/2015   Chronic bronchitis (HCC)    "get it q yr"   Chronic lower back pain    "since I was a teen" (05/07/2017)   COPD (chronic obstructive pulmonary disease) (HCC)    CVA (cerebral vascular accident) (HCC) 05/2015   R sided weakness (05/07/2017)   Diabetes mellitus without complication (HCC)    Phreesia 08/08/2020   GERD (gastroesophageal reflux disease)    Gout    History of colonic polyps 04/03/2017   Added automatically from request for surgery [id number]   Hyperlipidemia    Hypertension    Myocardial infarction (HCC) 1999   Obesity    OSA on CPAP    Oxygen deficiency    Phreesia 08/08/2020   Pneumonia    "a few times; last time was ~ 06/2013" (05/07/2017)   Psoriasis    Stroke (HCC)    Phreesia 08/08/2020   Thyroid disease    Phreesia 08/08/2020   Tussive syncope 05/01/2015   Type II diabetes mellitus (HCC)  Vitamin B12 deficiency 05/01/2015    Past Surgical History: Past Surgical History:  Procedure Laterality Date   APPENDECTOMY     CATARACT EXTRACTION W/PHACO Left 10/07/2016   Procedure: CATARACT EXTRACTION PHACO AND INTRAOCULAR LENS PLACEMENT (IOC);  Surgeon: Jethro Bolus, MD;  Location: [facility];  Service: Ophthalmology;  Laterality: Left;  CDE: 13.49   CATARACT EXTRACTION W/PHACO Right 10/28/2016   Procedure: CATARACT EXTRACTION PHACO AND INTRAOCULAR LENS PLACEMENT (IOC);  Surgeon: Jethro Bolus, MD;  Location: [facility];  Service:  Ophthalmology;  Laterality: Right;  CDE: 16.71   COLONOSCOPY  03/17/2012   Procedure: COLONOSCOPY;  Surgeon: Malissa Hippo, MD;  Location: [facility];  Service: Endoscopy;  Laterality: N/A;  830   COLONOSCOPY N/A 06/25/2017   Procedure: COLONOSCOPY;  Surgeon: Malissa Hippo, MD;  Location: [facility];  Service: Endoscopy;  Laterality: N/A;  930   COLONOSCOPY WITH PROPOFOL N/A 03/12/2023   Procedure: COLONOSCOPY WITH PROPOFOL;  Surgeon: Franky Macho, MD;  Location: [facility];  Service: Endoscopy;  Laterality: N/A;  9:30am;asa 3   CORONARY ANGIOPLASTY WITH STENT PLACEMENT  1999- 2009-08/30/2013   "counting today's, I have 5 stents" (08/30/2013)   CORONARY ANGIOPLASTY WITH STENT PLACEMENT  05/07/2017   CORONARY STENT INTERVENTION N/A 05/07/2017   Procedure: CORONARY STENT INTERVENTION;  Surgeon: Rinaldo Cloud, MD;  [facility]: [facility];  Service: Cardiovascular;  Laterality: N/A;   CYSTOSCOPY WITH URETHRAL DILATATION N/A 11/26/2012   Procedure: CYSTOSCOPY WITH URETHRAL DILATATION;  Surgeon: Anner Crete, MD;  Location: [facility];  Service: Urology;  Laterality: N/A;   LEFT HEART CATH AND CORONARY ANGIOGRAPHY N/A 05/07/2017   Procedure: LEFT HEART CATH AND CORONARY ANGIOGRAPHY;  Surgeon: Rinaldo Cloud, MD;  [facility]: [facility];  Service: Cardiovascular;  Laterality: N/A;   LEFT HEART CATHETERIZATION WITH CORONARY ANGIOGRAM N/A 08/30/2013   Procedure: LEFT HEART CATHETERIZATION WITH CORONARY ANGIOGRAM;  Surgeon: Robynn Pane, MD;  Location: [facility] CATH [facility];  Service: Cardiovascular;  Laterality: N/A;   PERCUTANEOUS CORONARY STENT INTERVENTION (PCI-S) Right 08/30/2013   Procedure: PERCUTANEOUS CORONARY STENT INTERVENTION (PCI-S);  Surgeon: Robynn Pane, MD;  Location: [facility] CATH LAB;  Service: Cardiovascular;  Laterality: Right;   POLYPECTOMY  03/12/2023   Procedure: POLYPECTOMY;  Surgeon: Franky Macho, MD;  [facility]: [facility];  Service: Endoscopy;;    Family  History: Family History  Problem Relation Age of Onset   Hypertension Mother    Diabetes Mother    Hypertension Father    Diabetes Brother    Stroke Daughter    Arthritis Other        Family History    Diabetes Other        family History     Social History: Social History   Tobacco Use  Smoking Status Former   Current packs/day: 0.00   Average packs/day: 0.5 packs/day for 33.0 years (16.5 ttl pk-yrs)   Types: Cigarettes   Start date: 11/28/1969   Quit date: 11/29/2002   Years since quitting: 20.3  Smokeless Tobacco Never   Social History   Substance and Sexual Activity  Alcohol Use No   Social History   Substance and Sexual Activity  Drug Use No    Allergies: No Known Allergies  Medications: Current Outpatient Medications  Medication Sig Dispense Refill   acetaminophen (TYLENOL) 500 MG tablet Take 1,000 mg by mouth every 6 (six) hours as needed for moderate pain (left thigh).      albuterol (VENTOLIN  HFA) 108 (90 Base) MCG/ACT inhaler INHALE 1 TO 2 PUFFS EVERY 6 HOURS AS NEEDED FOR WHEEZING OR SHORTNESS OF BREATH (Patient taking differently: Inhale 2 puffs into the lungs every 6 (six) hours as needed for shortness of breath or wheezing.) 1 each 10   allopurinol (ZYLOPRIM) 300 MG tablet TAKE 1 TABLET EVERY DAY 90 tablet 3   aspirin EC 81 MG tablet Take 81 mg by mouth daily.     Blood Glucose Calibration (TRUE METRIX LEVEL 1) Low SOLN Use as directed 1 each 0   Blood Glucose Monitoring Suppl (TRUE METRIX METER) w/Device KIT USE AS DIRECTED 1 kit 0   clobetasol ointment (TEMOVATE) 0.05 % Apply twice daily to affected areas for 14 days then once daily, as needed 69 g 1   clopidogrel (PLAVIX) 75 MG tablet TAKE 1 TABLET EVERY DAY 90 tablet 3   DROPLET INSULIN SYRINGE 31G X 5/16" 1 ML MISC USE AS DIRECTED IN THE MORNING AND AT BEDTIME 100 each 0   ezetimibe (ZETIA) 10 MG tablet TAKE 1 TABLET EVERY DAY 90 tablet 10   glucose blood (TRUE METRIX BLOOD GLUCOSE TEST) test  strip TEST BLOOD SUGAR FOUR TIMES DAILY 400 strip 3   insulin NPH-regular Human (NOVOLIN 70/30) (70-30) 100 UNIT/ML injection Inject 35-45 Units into the skin 2 (two) times daily with a meal. Take 45 units with breakfast and 35 units with supper if glucose is above 90 and eating. 90 mL 3   isosorbide mononitrate (IMDUR) 30 MG 24 hr tablet TAKE 1 TABLET EVERY DAY 90 tablet 3   levETIRAcetam (KEPPRA) 500 MG tablet TAKE 1 TABLET TWICE DAILY 180 tablet 3   levothyroxine (SYNTHROID) 112 MCG tablet Take 1 tablet (112 mcg total) by mouth daily. 90 tablet 3   metoprolol tartrate (LOPRESSOR) 50 MG tablet TAKE 1/2 TABLET TWICE DAILY 90 tablet 3   nitroGLYCERIN (NITROSTAT) 0.4 MG SL tablet Place 1 tablet (0.4 mg total) under the tongue every 5 (five) minutes as needed for chest pain. 100 tablet 3   pantoprazole (PROTONIX) 40 MG tablet TAKE 1 TABLET EVERY DAY BEFORE BREAKFAST 90 tablet 3   pravastatin (PRAVACHOL) 40 MG tablet TAKE 2 TABLETS EVERY DAY 180 tablet 3   Probiotic Product (ALIGN) 4 MG CAPS Take 1 capsule (4 mg total) by mouth daily. 30 capsule 0   spironolactone (ALDACTONE) 25 MG tablet Take 12.5 mg by mouth daily. Take half a tablet by mouth daily     spironolactone (ALDACTONE) 50 MG tablet TAKE 1 TABLET EVERY DAY (DOSE INCREASE) 90 tablet 3   TRUEplus Lancets 33G MISC TEST BLOOD SUGAR FOUR TIMES DAILY 400 each 0   UNABLE TO FIND CPAP mask and tubing  DX G47.33 1 each 0   Vitamin D, Ergocalciferol, (DRISDOL) 1.25 MG (50000 UNIT) CAPS capsule Take 1 capsule (50,000 Units total) by mouth every 7 (seven) days. 12 capsule 2   polyethylene glycol (MIRALAX / GLYCOLAX) 17 g packet Take 17 g by mouth daily as needed. (Patient not taking: Reported on 04/09/2023) 14 each 0   polyethylene glycol (MIRALAX / GLYCOLAX) 17 g packet Take 17 g by mouth 2 (two) times daily. (Patient not taking: Reported on 04/09/2023) 180 packet 0   psyllium (METAMUCIL) 58.6 % packet Take 1 packet by mouth 2 (two) times daily.  (Patient not taking: Reported on 04/09/2023) 60 packet 2   No current facility-administered medications for this visit.    Review of Systems: GENERAL: negative for malaise, night sweats HEENT:  No changes in hearing or vision, no nose bleeds or other nasal problems. NECK: Negative for lumps, goiter, pain and significant neck swelling RESPIRATORY: Negative for cough, wheezing CARDIOVASCULAR: Negative for chest pain, leg swelling, palpitations, orthopnea GI: SEE HPI MUSCULOSKELETAL: Negative for joint pain or swelling, back pain, and muscle pain. SKIN: Negative for lesions, rash PSYCH: Negative for sleep disturbance, mood disorder and recent psychosocial stressors. HEMATOLOGY Negative for prolonged bleeding, bruising easily, and swollen nodes. ENDOCRINE: Negative for cold or heat intolerance, polyuria, polydipsia and goiter. NEURO: negative for tremor, gait imbalance, syncope and seizures. The remainder of the review of systems is noncontributory.   Physical Exam: BP (!) 164/81   Pulse 89   Temp (!) 97.5 F (36.4 C) (Oral)   Ht 5\' 10"  (1.778 m)   Wt 248 lb 9.6 oz (112.8 kg)   BMI 35.67 kg/m  GENERAL: The patient is AO x3, in no acute distress. HEENT: Head is normocephalic and atraumatic. EOMI are intact. Mouth is well hydrated and without lesions. NECK: Supple. No masses LUNGS: Clear to auscultation. No presence of rhonchi/wheezing/rales. Adequate chest expansion HEART: RRR, normal s1 and s2. ABDOMEN: Soft, nontender, no guarding, no peritoneal signs, and nondistended. BS +. No masses. EXTREMITIES: Without any cyanosis, clubbing, rash, lesions or edema. NEUROLOGIC: AOx3, no focal motor deficit. SKIN: no jaundice, no rashes  Imaging/Labs: as above  I personally reviewed and interpreted the available labs, imaging and endoscopic files.  Impression and Plan: NIALL [name] is a 82 y.o. male with past medical history of coronary artery disease, CHF, COPD, CVA, seizure  disorder, OSA, diabetes, hypertension, hyperlipidemia, who presents for evaluation of diarrhea .  Patient has presented recurrent episodes of diarrhea and mucus production but unclear etiology.  Recently underwent colonoscopy without presence of abnormalities that would explain endoscopically his diarrhea.  Will check other etiologies with serologies.  Given his history of anemia, will reorder workup ordered by Dr. Tasia Catchings..  I will also order a GI pathogen panel to rule out chronic gastrointestinal infections.  For now, I advised him to start taking Benefiber on a regular basis to increase the bulk of his stool.  -Check CBC, CRP, B12, folic acid, celiac disease panel, iron panel and GI pathogen panel - start Benefiber 1-2 tablespoons per day  All questions were answered.      Katrinka Blazing, MD Gastroenterology and Hepatology [facility] Gastroenterology

## 2023-04-10 LAB — C-REACTIVE PROTEIN: CRP: 44.7 mg/L — ABNORMAL HIGH (ref <8.0)

## 2023-04-10 LAB — CBC WITH DIFFERENTIAL/PLATELET
Absolute Lymphocytes: 2328 {cells}/uL (ref 850–3900)
Absolute Monocytes: 696 {cells}/uL (ref 200–950)
Basophils Absolute: 108 {cells}/uL (ref 0–200)
Basophils Relative: 0.9 %
Eosinophils Absolute: 120 {cells}/uL (ref 15–500)
Eosinophils Relative: 1 %
HCT: 35.7 % — ABNORMAL LOW (ref 38.5–50.0)
Hemoglobin: 11.7 g/dL — ABNORMAL LOW (ref 13.2–17.1)
MCH: 28.2 pg (ref 27.0–33.0)
MCHC: 32.8 g/dL (ref 32.0–36.0)
MCV: 86 fL (ref 80.0–100.0)
MPV: 10 fL (ref 7.5–12.5)
Monocytes Relative: 5.8 %
Neutro Abs: 8748 {cells}/uL — ABNORMAL HIGH (ref 1500–7800)
Neutrophils Relative %: 72.9 %
Platelets: 249 10*3/uL (ref 140–400)
RBC: 4.15 10*6/uL — ABNORMAL LOW (ref 4.20–5.80)
RDW: 14.1 % (ref 11.0–15.0)
Total Lymphocyte: 19.4 %
WBC: 12 10*3/uL — ABNORMAL HIGH (ref 3.8–10.8)

## 2023-04-10 LAB — CELIAC DISEASE PANEL
(tTG) Ab, IgA: <1 U/mL
(tTG) Ab, IgG: <1 U/mL
Gliadin IgA: 37.5 U/mL — ABNORMAL HIGH
Gliadin IgG: <1 U/mL
Immunoglobulin A: 458 mg/dL — ABNORMAL HIGH (ref 70–320)

## 2023-04-10 LAB — B12 AND FOLATE PANEL
Folate: 9.5 ng/mL
Vitamin B-12: 274 pg/mL (ref 200–1100)

## 2023-04-10 LAB — IRON,TIBC AND FERRITIN PANEL
%SAT: 21 % (ref 20–48)
Ferritin: 184 ng/mL (ref 24–380)
Iron: 47 ug/dL — ABNORMAL LOW (ref 50–180)
TIBC: 229 ug/dL — ABNORMAL LOW (ref 250–425)

## 2023-04-13 DIAGNOSIS — D649 Anemia, unspecified: Secondary | ICD-10-CM | POA: Diagnosis not present

## 2023-04-13 DIAGNOSIS — K529 Noninfective gastroenteritis and colitis, unspecified: Secondary | ICD-10-CM | POA: Diagnosis not present

## 2023-04-13 DIAGNOSIS — R197 Diarrhea, unspecified: Secondary | ICD-10-CM | POA: Diagnosis not present

## 2023-04-15 ENCOUNTER — Telehealth (INDEPENDENT_AMBULATORY_CARE_PROVIDER_SITE_OTHER): Payer: Self-pay | Admitting: Gastroenterology

## 2023-04-15 ENCOUNTER — Encounter (INDEPENDENT_AMBULATORY_CARE_PROVIDER_SITE_OTHER): Payer: Self-pay

## 2023-04-15 LAB — GASTROINTESTINAL PATHOGEN PNL
CampyloBacter Group: NOT DETECTED
Norovirus GI/GII: NOT DETECTED
Rotavirus A: NOT DETECTED
Salmonella species: NOT DETECTED
Shiga Toxin 1: NOT DETECTED
Shiga Toxin 2: NOT DETECTED
Shigella Species: NOT DETECTED
Vibrio Group: NOT DETECTED
Yersinia enterocolitica: NOT DETECTED

## 2023-04-15 NOTE — Progress Notes (Signed)
Hi  Wendy :Can you please call the patient and tell the patient the lab work  shows elevated inflammation marker, and lab work with elevated antibodies concerning for celiac disease  Although the stool test is negative for any infection  Given patient is anemic and continues to be symptomatic with diarrhea I recommend upper endoscopy to look into the stomach and take biopsies of the small bowel to make sure he does not have celiac disease or anything else which would explain diarrhea and anemia  If patient is agreeable for upper endoscopy let us know  Tanya, :Can you please schedule a Upper endoscopy ? Dx: Anemia, Diarrhea . Room: 3  Thanks,  Vista Lawman, MD Gastroenterology and Hepatology [facility] Gastroenterology   Thanks,  Vista Lawman, MD Gastroenterology and Hepatology [facility] Gastroenterology   ===== Normal IgA deficiency but elevated antigliadin IgA (37.5) also normal TTG IgA  CRP 44.7.    Hemoglobin 11.7 ferritin 184, normal B12 and folate levels

## 2023-04-15 NOTE — Telephone Encounter (Signed)
    04/15/23  [name] [birth year]  What type of surgery is being performed? EGD  When is surgery scheduled? 05/15/23  CLEARANCE TO HOLD PLAVIX FOR 5 DAYS   Name of physician performing surgery?  Dr. Starleen Arms Gastroenterology at [facility] Phone: [phone] Fax: [phone]  Anethesia type (none, local, MAC, general)? MAC

## 2023-04-20 ENCOUNTER — Other Ambulatory Visit: Payer: Self-pay

## 2023-04-20 ENCOUNTER — Encounter (INDEPENDENT_AMBULATORY_CARE_PROVIDER_SITE_OTHER): Payer: Self-pay

## 2023-04-20 DIAGNOSIS — E1121 Type 2 diabetes mellitus with diabetic nephropathy: Secondary | ICD-10-CM

## 2023-05-08 NOTE — Patient Instructions (Signed)
BURRIS ALBERS  05/08/2023     @PREFPERIOPPHARMACY @   Your procedure is scheduled on  05/15/2023.   [facility] to [facility] at  0815  A.M.   Call this number if you have problems the morning of surgery:  [phone]  If you experience any cold or flu symptoms such as cough, fever, chills, shortness of breath, etc. between now and your scheduled surgery, please notify us at the above number.   Remember:        Your last dose of plavix should be on 05/09/2023.        Take 1/2 of your usual insulin dose the night before your procedure.        DO NOT take any medications for diabetes the morning of your procedure.        Use your inhaler before you come and bring your rescue inhaler with you.    Follow the diet instructions given to you by the office.   You may drink clear liquids until 0615 am on 05/15/2023.   Clear liquids allowed are:                    Water, Juice (No red color; non-citric and without pulp; diabetics please choose diet or no sugar options), Carbonated beverages (diabetics please choose diet or no sugar options), Clear Tea (No creamer, milk, or cream, including half & half and powdered creamer), Black Coffee Only (No creamer, milk or cream, including half & half and powdered creamer), and Clear Sports drink (No red color; diabetics please choose diet or no sugar options)    Take these medicines the morning of surgery with A SIP OF WATER                   allopurinol, isosrbide, levetiracetam, levothyroxine, metoprolol.     Do not wear jewelry, make-up or nail polish, including gel polish,  artificial nails, or any other type of covering on natural nails (fingers and  toes).   Do not wear lotions, powders, or perfumes, or deodorant.  Do not shave 48 hours prior to surgery.  Men may shave face and neck.  Do not bring valuables to the hospital.  [facility] is not responsible for any belongings or valuables.  Contacts, dentures or bridgework may  not be worn into surgery.  Leave your suitcase in the car.  After surgery it may be brought to your room.  For patients admitted to the hospital, discharge time will be determined by your treatment team.  Patients discharged the day of surgery will not be allowed to drive home and must have someone with them for 24 hours.    Special instructions:   DO NOT smoke tobacco or vape for 24 hours before your procedure,  Please read over the following fact sheets that you were given. Anesthesia Post-op Instructions and Care and Recovery After Surgery      Upper Endoscopy, Adult, Care After After the procedure, it is common to have a sore throat. It is also common to have: Mild stomach pain or discomfort. Bloating. Nausea. Follow these instructions at home: The instructions below may help you care for yourself at home. Your health care provider may give you more instructions. If you have questions, ask your health care provider. If you were given a sedative during the procedure, it can affect you for several hours. Do not drive or operate machinery until your health care provider says that  it is safe. If you will be going home right after the procedure, plan to have a responsible adult: Take you home from the hospital or clinic. You will not be allowed to drive. Care for you for the time you are told. Follow instructions from your health care provider about what you may eat and drink. Return to your normal activities as told by your health care provider. Ask your health care provider what activities are safe for you. Take over-the-counter and prescription medicines only as told by your health care provider. Contact a health care provider if you: Have a sore throat that lasts longer than one day. Have trouble swallowing. Have a fever. Get help right away if you: Vomit blood or your vomit looks like coffee grounds. Have bloody, black, or tarry stools. Have a very bad sore throat or you cannot  swallow. Have difficulty breathing or very bad pain in your chest or abdomen. These symptoms may be an emergency. Get help right away. Call 911. Do not wait to see if the symptoms will go away. Do not drive yourself to the hospital. Summary After the procedure, it is common to have a sore throat, mild stomach discomfort, bloating, and nausea. If you were given a sedative during the procedure, it can affect you for several hours. Do not drive until your health care provider says that it is safe. Follow instructions from your health care provider about what you may eat and drink. Return to your normal activities as told by your health care provider. This information is not intended to replace advice given to you by your health care provider. Make sure you discuss any questions you have with your health care provider. Document Revised: 08/14/2021 Document Reviewed: 08/14/2021 Elsevier Patient Education  2024 [employer]. Monitored Anesthesia Care, Care After The following information offers guidance on how to care for yourself after your procedure. Your health care provider may also give you more specific instructions. If you have problems or questions, contact your health care provider. What can I expect after the procedure? After the procedure, it is common to have: Tiredness. Little or no memory about what happened during or after the procedure. Impaired judgment when it comes to making decisions. Nausea or vomiting. Some trouble with balance. Follow these instructions at home: For the time period you were told by your health care provider:  Rest. Do not participate in activities where you could fall or become injured. Do not drive or use machinery. Do not drink alcohol. Do not take sleeping pills or medicines that cause drowsiness. Do not make important decisions or sign legal documents. Do not take care of children on your own. Medicines Take over-the-counter and prescription  medicines only as told by your health care provider. If you were prescribed antibiotics, take them as told by your health care provider. Do not stop using the antibiotic even if you start to feel better. Eating and drinking Follow instructions from your health care provider about what you may eat and drink. Drink enough fluid to keep your urine pale yellow. If you vomit: Drink clear fluids slowly and in small amounts as you are able. Clear fluids include water, ice chips, low-calorie sports drinks, and fruit juice that has water added to it (diluted fruit juice). Eat light and bland foods in small amounts as you are able. These foods include bananas, applesauce, rice, lean meats, toast, and crackers. General instructions  Have a responsible adult stay with you for the time you are  told. It is important to have someone help care for you until you are awake and alert. If you have sleep apnea, surgery and some medicines can increase your risk for breathing problems. Follow instructions from your health care provider about wearing your sleep device: When you are sleeping. This includes during daytime naps. While taking prescription pain medicines, sleeping medicines, or medicines that make you drowsy. Do not use any products that contain nicotine or tobacco. These products include cigarettes, chewing tobacco, and vaping devices, such as e-cigarettes. If you need help quitting, ask your health care provider. Contact a health care provider if: You feel nauseous or vomit every time you eat or drink. You feel light-headed. You are still sleepy or having trouble with balance after 24 hours. You get a rash. You have a fever. You have redness or swelling around the IV site. Get help right away if: You have trouble breathing. You have new confusion after you get home. These symptoms may be an emergency. Get help right away. Call 911. Do not wait to see if the symptoms will go away. Do not drive  yourself to the hospital. This information is not intended to replace advice given to you by your health care provider. Make sure you discuss any questions you have with your health care provider. Document Revised: 09/30/2021 Document Reviewed: 09/30/2021 Elsevier Patient Education  2024 [employer].

## 2023-05-11 ENCOUNTER — Encounter (HOSPITAL_COMMUNITY)
Admission: RE | Admit: 2023-05-11 | Discharge: 2023-05-11 | Disposition: A | Discharge status: Home / Self Care | Payer: Medicare HMO | Source: Ambulatory Visit | Attending: Gastroenterology | Admitting: Gastroenterology

## 2023-05-11 ENCOUNTER — Other Ambulatory Visit: Payer: Self-pay | Admitting: Family Medicine

## 2023-05-11 ENCOUNTER — Encounter (HOSPITAL_COMMUNITY): Payer: Self-pay

## 2023-05-11 ENCOUNTER — Encounter (INDEPENDENT_AMBULATORY_CARE_PROVIDER_SITE_OTHER): Payer: Medicare HMO | Admitting: Gastroenterology

## 2023-05-11 VITALS — BP 133/76 | HR 100 | Temp 97.9°F | Resp 18 | Ht 70.0 in | Wt 248.7 lb

## 2023-05-11 DIAGNOSIS — Z794 Long term (current) use of insulin: Secondary | ICD-10-CM | POA: Diagnosis not present

## 2023-05-11 DIAGNOSIS — I2510 Atherosclerotic heart disease of native coronary artery without angina pectoris: Secondary | ICD-10-CM

## 2023-05-11 DIAGNOSIS — E1121 Type 2 diabetes mellitus with diabetic nephropathy: Secondary | ICD-10-CM

## 2023-05-11 DIAGNOSIS — Z01812 Encounter for preprocedural laboratory examination: Secondary | ICD-10-CM | POA: Diagnosis not present

## 2023-05-11 LAB — BASIC METABOLIC PANEL
Anion gap: 7 (ref 5–15)
BUN: 19 mg/dL (ref 8–23)
CO2: 24 mmol/L (ref 22–32)
Calcium: 9.1 mg/dL (ref 8.9–10.3)
Chloride: 103 mmol/L (ref 98–111)
Creatinine, Ser: 1.2 mg/dL (ref 0.61–1.24)
GFR, Estimated: >60 mL/min (ref >60)
Glucose, Bld: 129 mg/dL — ABNORMAL HIGH (ref 70–99)
Potassium: 3.9 mmol/L (ref 3.5–5.1)
Sodium: 134 mmol/L — ABNORMAL LOW (ref 135–145)

## 2023-05-15 ENCOUNTER — Encounter (HOSPITAL_COMMUNITY): Payer: Self-pay | Admitting: Gastroenterology

## 2023-05-15 ENCOUNTER — Ambulatory Visit (HOSPITAL_COMMUNITY): Payer: Medicare HMO | Admitting: Anesthesiology

## 2023-05-15 ENCOUNTER — Encounter (HOSPITAL_COMMUNITY)
Admission: RE | Disposition: A | Discharge status: Home / Self Care | Payer: Self-pay | Source: Home / Self Care | Attending: Gastroenterology

## 2023-05-15 ENCOUNTER — Other Ambulatory Visit: Payer: Self-pay

## 2023-05-15 ENCOUNTER — Ambulatory Visit (HOSPITAL_COMMUNITY)
Admission: RE | Admit: 2023-05-15 | Discharge: 2023-05-15 | Disposition: A | Discharge status: Home / Self Care | Payer: Medicare HMO | Attending: Gastroenterology | Admitting: Gastroenterology

## 2023-05-15 DIAGNOSIS — R197 Diarrhea, unspecified: Secondary | ICD-10-CM | POA: Insufficient documentation

## 2023-05-15 DIAGNOSIS — K31A19 Gastric intestinal metaplasia without dysplasia, unspecified site: Secondary | ICD-10-CM | POA: Diagnosis not present

## 2023-05-15 DIAGNOSIS — Z8673 Personal history of transient ischemic attack (TIA), and cerebral infarction without residual deficits: Secondary | ICD-10-CM | POA: Insufficient documentation

## 2023-05-15 DIAGNOSIS — R768 Other specified abnormal immunological findings in serum: Secondary | ICD-10-CM | POA: Diagnosis not present

## 2023-05-15 DIAGNOSIS — Z9981 Dependence on supplemental oxygen: Secondary | ICD-10-CM | POA: Diagnosis not present

## 2023-05-15 DIAGNOSIS — K2950 Unspecified chronic gastritis without bleeding: Secondary | ICD-10-CM

## 2023-05-15 DIAGNOSIS — I509 Heart failure, unspecified: Secondary | ICD-10-CM | POA: Diagnosis not present

## 2023-05-15 DIAGNOSIS — Z79890 Hormone replacement therapy (postmenopausal): Secondary | ICD-10-CM | POA: Insufficient documentation

## 2023-05-15 DIAGNOSIS — G4733 Obstructive sleep apnea (adult) (pediatric): Secondary | ICD-10-CM | POA: Diagnosis not present

## 2023-05-15 DIAGNOSIS — G40909 Epilepsy, unspecified, not intractable, without status epilepticus: Secondary | ICD-10-CM | POA: Diagnosis not present

## 2023-05-15 DIAGNOSIS — K219 Gastro-esophageal reflux disease without esophagitis: Secondary | ICD-10-CM | POA: Insufficient documentation

## 2023-05-15 DIAGNOSIS — E039 Hypothyroidism, unspecified: Secondary | ICD-10-CM | POA: Insufficient documentation

## 2023-05-15 DIAGNOSIS — Z7902 Long term (current) use of antithrombotics/antiplatelets: Secondary | ICD-10-CM | POA: Insufficient documentation

## 2023-05-15 DIAGNOSIS — Z794 Long term (current) use of insulin: Secondary | ICD-10-CM | POA: Diagnosis not present

## 2023-05-15 DIAGNOSIS — E785 Hyperlipidemia, unspecified: Secondary | ICD-10-CM | POA: Insufficient documentation

## 2023-05-15 DIAGNOSIS — E119 Type 2 diabetes mellitus without complications: Secondary | ICD-10-CM | POA: Diagnosis not present

## 2023-05-15 DIAGNOSIS — J449 Chronic obstructive pulmonary disease, unspecified: Secondary | ICD-10-CM | POA: Insufficient documentation

## 2023-05-15 DIAGNOSIS — Z87891 Personal history of nicotine dependence: Secondary | ICD-10-CM | POA: Diagnosis not present

## 2023-05-15 DIAGNOSIS — I252 Old myocardial infarction: Secondary | ICD-10-CM | POA: Insufficient documentation

## 2023-05-15 DIAGNOSIS — I2510 Atherosclerotic heart disease of native coronary artery without angina pectoris: Secondary | ICD-10-CM | POA: Diagnosis not present

## 2023-05-15 DIAGNOSIS — Z79899 Other long term (current) drug therapy: Secondary | ICD-10-CM | POA: Diagnosis not present

## 2023-05-15 DIAGNOSIS — K2940 Chronic atrophic gastritis without bleeding: Secondary | ICD-10-CM | POA: Diagnosis not present

## 2023-05-15 DIAGNOSIS — I890 Lymphedema, not elsewhere classified: Secondary | ICD-10-CM | POA: Insufficient documentation

## 2023-05-15 DIAGNOSIS — D649 Anemia, unspecified: Secondary | ICD-10-CM | POA: Insufficient documentation

## 2023-05-15 DIAGNOSIS — I110 Hypertensive heart disease with heart failure: Secondary | ICD-10-CM | POA: Diagnosis not present

## 2023-05-15 DIAGNOSIS — K2970 Gastritis, unspecified, without bleeding: Secondary | ICD-10-CM

## 2023-05-15 DIAGNOSIS — Z955 Presence of coronary angioplasty implant and graft: Secondary | ICD-10-CM | POA: Diagnosis not present

## 2023-05-15 HISTORY — PX: BIOPSY: SHX5522

## 2023-05-15 HISTORY — PX: ESOPHAGOGASTRODUODENOSCOPY (EGD) WITH PROPOFOL: SHX5813

## 2023-05-15 LAB — GLUCOSE, CAPILLARY: Glucose-Capillary: 132 mg/dL — ABNORMAL HIGH (ref 70–99)

## 2023-05-15 SURGERY — ESOPHAGOGASTRODUODENOSCOPY (EGD) WITH PROPOFOL
Anesthesia: General

## 2023-05-15 MED ORDER — PROPOFOL 10 MG/ML IV BOLUS
INTRAVENOUS | Status: DC | PRN
  Administered 2023-05-15: 20 mg via INTRAVENOUS

## 2023-05-15 MED ORDER — LACTATED RINGERS IV SOLN: INTRAVENOUS | Status: DC | PRN

## 2023-05-15 MED ORDER — PROPOFOL 500 MG/50ML IV EMUL
INTRAVENOUS | Status: DC | PRN
  Administered 2023-05-15: 150 ug/kg/min via INTRAVENOUS

## 2023-05-15 MED ORDER — LIDOCAINE HCL (CARDIAC) PF 100 MG/5ML IV SOSY
PREFILLED_SYRINGE | INTRAVENOUS | Status: DC | PRN
  Administered 2023-05-15: 50 mg via INTRATRACHEAL

## 2023-05-15 NOTE — Op Note (Signed)
[facility] Patient Name: [name] Procedure Date: 05/15/2023 8:34 AM MRN: [id number] Date of Birth: August 05, 1940 Attending MD: Sanjuan Dame , MD, [id number] CSN: [id number] Age: 82 Admit Type: Outpatient Procedure:                Upper GI endoscopy Indications:              Diarrhea, Endoscopy to assess diarrhea in patient                            suspected of having celiac disease Providers:                Sanjuan Dame, MD, Buel Ream. Thomasena Edis RN, RN,                            Elinor Parkinson Referring MD:              Medicines:                Monitored Anesthesia Care Complications:            No immediate complications. Estimated Blood Loss:     Estimated blood loss was minimal. Procedure:                Pre-Anesthesia Assessment:                           - Prior to the procedure, a History and Physical                            was performed, and patient medications and                            allergies were reviewed. The patient's tolerance of                            previous anesthesia was also reviewed. The risks                            and benefits of the procedure and the sedation                            options and risks were discussed with the patient.                            All questions were answered, and informed consent                            was obtained. Prior Anticoagulants: The patient has                            taken Plavix (clopidogrel), last dose was 5 days                            prior to procedure. ASA Grade Assessment: III - A  patient with severe systemic disease. After                            reviewing the risks and benefits, the patient was                            deemed in satisfactory condition to undergo the                            procedure.                           After obtaining informed consent, the endoscope was                            passed under direct vision.  Throughout the                            procedure, the patient's blood pressure, pulse, and                            oxygen saturations were monitored continuously. The                            [id number]-[id number] ([id number]) scope was introduced through the                            mouth, and advanced to the second part of duodenum.                            The upper GI endoscopy was accomplished without                            difficulty. The patient tolerated the procedure                            well. Scope In: 8:48:21 AM Scope Out: 8:55:01 AM Total Procedure Duration: 0 hours 6 minutes 40 seconds  Findings:      The examined esophagus was normal.      Diffuse moderate inflammation characterized by erosions was found in the       entire examined stomach. Biopsies were taken with a cold forceps for       histology.      Lymphangiectasia was present in the duodenal bulb and in the second       portion of the duodenum. Biopsies for histology were taken with a cold       forceps for evaluation of celiac disease. Impression:               - Normal esophagus.                           - Atrophic gastritis. Biopsied.                           - Duodenal mucosal lymphangiectasia. Moderate Sedation:  Per Anesthesia Care Recommendation:           - Patient has a contact number available for                            emergencies. The signs and symptoms of potential                            delayed complications were discussed with the                            patient. Return to normal activities tomorrow.                            Written discharge instructions were provided to the                            patient.                           - Resume previous diet.                           - Continue present medications.                           - Await pathology results. Procedure Code(s):        --- Professional ---                           [phone],  Esophagogastroduodenoscopy, flexible,                            transoral; with biopsy, single or multiple Diagnosis Code(s):        --- Professional ---                           K29.40, Chronic atrophic gastritis without bleeding                           I89.0, Lymphedema, not elsewhere classified                           R19.7, Diarrhea, unspecified CPT copyright 2022 American Medical Association. All rights reserved. The codes documented in this report are preliminary and upon coder review may  be revised to meet current compliance requirements. Sanjuan Dame, MD Sanjuan Dame, MD 05/15/2023 9:04:34 AM This report has been signed electronically. Number of Addenda: 0

## 2023-05-15 NOTE — Anesthesia Preprocedure Evaluation (Signed)
Anesthesia Evaluation  Patient identified by MRN, date of birth, ID band Patient awake    Reviewed: Allergy & Precautions, NPO status , Patient's Chart, lab work & pertinent test results  Airway Mallampati: IV  TM Distance: >3 FB Neck ROM: Full    Dental  (+) Edentulous Upper, Partial Lower, Dental Advisory Given   Pulmonary sleep apnea and Continuous Positive Airway Pressure Ventilation , COPD,  oxygen dependent, former smoker   Pulmonary exam normal breath sounds clear to auscultation       Cardiovascular hypertension, Pt. on medications and Pt. on home beta blockers (-) angina + CAD, + Past MI, + Cardiac Stents and +CHF  Normal cardiovascular exam Rhythm:Regular Rate:Normal     Neuro/Psych  Neuromuscular disease CVA    GI/Hepatic ,GERD  Medicated and Controlled,Patient received Oral Contrast Agents,  Endo/Other  diabetes, Type 2Hypothyroidism  Class 3 obesity  Renal/GU Renal disease     Musculoskeletal   Abdominal Normal abdominal exam  (+)   Peds  Hematology  (+) Blood dyscrasia, anemia   Anesthesia Other Findings   Reproductive/Obstetrics                             Anesthesia Physical Anesthesia Plan  ASA: 4  Anesthesia Plan: General   Post-op Pain Management: Minimal or no pain anticipated   Induction: Intravenous  PONV Risk Score and Plan: Propofol infusion  Airway Management Planned: Natural Airway and Simple Face Mask  Additional Equipment: None  Intra-op Plan:   Post-operative Plan:   Informed Consent: I have reviewed the patients History and Physical, chart, labs and discussed the procedure including the risks, benefits and alternatives for the proposed anesthesia with the patient or authorized representative who has indicated his/her understanding and acceptance.       Plan Discussed with: CRNA  Anesthesia Plan Comments:         Anesthesia Quick  Evaluation

## 2023-05-15 NOTE — Anesthesia Postprocedure Evaluation (Signed)
Anesthesia Post Note  Patient: [name]  Procedure(s) Performed: ESOPHAGOGASTRODUODENOSCOPY (EGD) WITH PROPOFOL BIOPSY  Patient location during evaluation: Short Stay Anesthesia Type: General Level of consciousness: awake Pain management: pain level controlled Vital Signs Assessment: post-procedure vital signs reviewed and stable Respiratory status: spontaneous breathing Cardiovascular status: blood pressure returned to baseline Postop Assessment: no apparent nausea or vomiting Anesthetic complications: no   No notable events documented.   Last Vitals:  Vitals:   05/15/23 0820 05/15/23 0903  BP:  (!) 105/55  Pulse:  71  Resp:  18  Temp: 36.6 C 36.6 C  SpO2:  95%    Last Pain:  Vitals:   05/15/23 0903  TempSrc: Oral  PainSc: 0-No pain                 Marah Park

## 2023-05-15 NOTE — H&P (Signed)
Primary Care Physician:  Kerri Perches, MD Primary Gastroenterologist:  Dr. Tasia Catchings  Pre-Procedure History & Physical: HPI:  [name] is a 82 y.o. male with past medical history of coronary artery disease, CHF, COPD, CVA, seizure disorder, OSA, diabetes, hypertension, hyperlipidemia, who presents for evaluation of diarrhea . elevated antibodies concerning for celiac disease   patient is anemic and continues to be symptomatic with diarrhea I recommended upper endoscopy to look into the stomach and take biopsies of the small bowel to make sure he does not have celiac disease or anything else which would explain diarrhea and anemia   Normal IgA deficiency but elevated antigliadin IgA (37.5) also normal TTG IgA   CRP 44.7.     Hemoglobin 11.7 ferritin 184, normal B12 and folate levels  Past Medical History:  Diagnosis Date   Abnormality of gait 05/01/2015   Anemia 04/19/2015   Annual physical exam 12/11/2013   Arthritis    "left leg; knees" (05/07/2017)   Arthritis of knee    CAD (coronary artery disease)    CHF (congestive heart failure) (HCC)    Phreesia 08/08/2020   Choking 07/13/2015   Chronic bronchitis (HCC)    "get it q yr"   Chronic lower back pain    "since I was a teen" (05/07/2017)   COPD (chronic obstructive pulmonary disease) (HCC)    CVA (cerebral vascular accident) (HCC) 05/2015   R sided weakness (05/07/2017)   Diabetes mellitus without complication (HCC)    Phreesia 08/08/2020   GERD (gastroesophageal reflux disease)    Gout    History of colonic polyps 04/03/2017   Added automatically from request for surgery [id number]   Hyperlipidemia    Hypertension    Myocardial infarction (HCC) 1999   Obesity    OSA on CPAP    Oxygen deficiency    Phreesia 08/08/2020   Pneumonia    "a few times; last time was ~ 06/2013" (05/07/2017)   Psoriasis    Stroke (HCC)    Phreesia 08/08/2020   Thyroid disease    Phreesia 08/08/2020   Tussive syncope 05/01/2015    Type II diabetes mellitus (HCC)    Vitamin B12 deficiency 05/01/2015    Past Surgical History:  Procedure Laterality Date   APPENDECTOMY     CATARACT EXTRACTION W/PHACO Left 10/07/2016   Procedure: CATARACT EXTRACTION PHACO AND INTRAOCULAR LENS PLACEMENT (IOC);  Surgeon: Jethro Bolus, MD;  Location: [facility];  Service: Ophthalmology;  Laterality: Left;  CDE: 13.49   CATARACT EXTRACTION W/PHACO Right 10/28/2016   Procedure: CATARACT EXTRACTION PHACO AND INTRAOCULAR LENS PLACEMENT (IOC);  Surgeon: Jethro Bolus, MD;  Location: [facility];  Service: Ophthalmology;  Laterality: Right;  CDE: 16.71   COLONOSCOPY  03/17/2012   Procedure: COLONOSCOPY;  Surgeon: Malissa Hippo, MD;  Location: [facility];  Service: Endoscopy;  Laterality: N/A;  830   COLONOSCOPY N/A 06/25/2017   Procedure: COLONOSCOPY;  Surgeon: Malissa Hippo, MD;  Location: [facility];  Service: Endoscopy;  Laterality: N/A;  930   COLONOSCOPY WITH PROPOFOL N/A 03/12/2023   Procedure: COLONOSCOPY WITH PROPOFOL;  Surgeon: Franky Macho, MD;  Location: [facility];  Service: Endoscopy;  Laterality: N/A;  9:30am;asa 3   CORONARY ANGIOPLASTY WITH STENT PLACEMENT  1999- 2009-08/30/2013   "counting today's, I have 5 stents" (08/30/2013)   CORONARY ANGIOPLASTY WITH STENT PLACEMENT  05/07/2017   CORONARY STENT INTERVENTION N/A 05/07/2017   Procedure: CORONARY STENT INTERVENTION;  Surgeon: Rinaldo Cloud, MD;  Location:  [facility];  Service: Cardiovascular;  Laterality: N/A;   CYSTOSCOPY WITH URETHRAL DILATATION N/A 11/26/2012   Procedure: CYSTOSCOPY WITH URETHRAL DILATATION;  Surgeon: Anner Crete, MD;  Location: [facility];  Service: Urology;  Laterality: N/A;   LEFT HEART CATH AND CORONARY ANGIOGRAPHY N/A 05/07/2017   Procedure: LEFT HEART CATH AND CORONARY ANGIOGRAPHY;  Surgeon: Rinaldo Cloud, MD;  Location: [facility];  Service: Cardiovascular;  Laterality: N/A;   LEFT HEART CATHETERIZATION WITH CORONARY ANGIOGRAM N/A  08/30/2013   Procedure: LEFT HEART CATHETERIZATION WITH CORONARY ANGIOGRAM;  Surgeon: Robynn Pane, MD;  Location: [facility];  Service: Cardiovascular;  Laterality: N/A;   PERCUTANEOUS CORONARY STENT INTERVENTION (PCI-S) Right 08/30/2013   Procedure: PERCUTANEOUS CORONARY STENT INTERVENTION (PCI-S);  Surgeon: Robynn Pane, MD;  Location: [facility] C[facility];  Service: Cardiovascular;  Laterality: Right;   POLYPECTOMY  03/12/2023   Procedure: POLYPECTOMY;  Surgeon: Franky Macho, MD;  [facility]: [facility];  Service: Endoscopy;;    Prior to Admission medications   Medication Sig Start Date End Date Taking? Authorizing Provider  acetaminophen (TYLENOL) 500 MG tablet Take 1,000 mg by mouth every 6 (six) hours as needed for moderate pain (left thigh).     [provider]  albuterol (VENTOLIN HFA) 108 (90 Base) MCG/ACT inhaler INHALE 1 TO 2 PUFFS EVERY 6 HOURS AS NEEDED FOR WHEEZING OR SHORTNESS OF BREATH Patient taking differently: Inhale 2 puffs into the lungs every 6 (six) hours as needed for shortness of breath or wheezing. 07/30/22   Dani Gobble, NP  allopurinol (ZYLOPRIM) 300 MG tablet TAKE 1 TABLET EVERY DAY 05/11/23   Kerri Perches, MD  aspirin EC 81 MG tablet Take 81 mg by mouth daily.    [provider]  Blood Glucose Calibration (TRUE METRIX LEVEL 1) Low SOLN Use as directed 03/09/20   Kerri Perches, MD  Blood Glucose Monitoring Suppl (TRUE METRIX METER) w/Device KIT USE AS DIRECTED 09/18/20   Kerri Perches, MD  clobetasol ointment (TEMOVATE) 0.05 % Apply twice daily to affected areas for 14 days then once daily, as needed 10/05/22   Kerri Perches, MD  clopidogrel (PLAVIX) 75 MG tablet TAKE 1 TABLET EVERY DAY 03/25/23   Kerri Perches, MD  DROPLET INSULIN SYRINGE 31G X 5/16" 1 ML MISC USE AS DIRECTED IN THE MORNING AND AT BEDTIME 06/11/20   Kerri Perches, MD  ezetimibe (ZETIA) 10 MG tablet TAKE 1 TABLET EVERY DAY 05/11/23   Kerri Perches, MD  glucose blood (TRUE METRIX BLOOD GLUCOSE TEST) test strip TEST BLOOD SUGAR FOUR TIMES DAILY 01/20/23   Kerri Perches, MD  insulin NPH-regular Human (NOVOLIN 70/30) (70-30) 100 UNIT/ML injection Inject 35-45 Units into the skin 2 (two) times daily with a meal. Take 45 units with breakfast and 35 units with supper if glucose is above 90 and eating. 01/30/23   Dani Gobble, NP  isosorbide mononitrate (IMDUR) 30 MG 24 hr tablet TAKE 1 TABLET EVERY DAY 09/10/22   Kerri Perches, MD  levETIRAcetam (KEPPRA) 500 MG tablet TAKE 1 TABLET TWICE DAILY 03/16/23   Kerri Perches, MD  levothyroxine (SYNTHROID) 112 MCG tablet Take 1 tablet (112 mcg total) by mouth daily. 01/30/23   Dani Gobble, NP  metoprolol tartrate (LOPRESSOR) 50 MG tablet TAKE 1/2 TABLET TWICE DAILY 05/11/23   Kerri Perches, MD  nitroGLYCERIN (NITROSTAT) 0.4 MG SL tablet Place 1 tablet (0.4 mg total)  under the tongue every 5 (five) minutes as needed for chest pain. 05/08/17 04/09/23  Rinaldo Cloud, MD  pantoprazole (PROTONIX) 40 MG tablet TAKE 1 TABLET EVERY DAY BEFORE BREAKFAST 03/16/23   Kerri Perches, MD  polyethylene glycol (MIRALAX / GLYCOLAX) 17 g packet Take 17 g by mouth daily as needed. Patient not taking: Reported on 04/09/2023 09/04/19   [place], Alvira Philips, DO  pravastatin (PRAVACHOL) 40 MG tablet TAKE 2 TABLETS EVERY DAY 03/25/23   Kerri Perches, MD  Probiotic Product (ALIGN) 4 MG CAPS Take 1 capsule (4 mg total) by mouth daily. 11/15/22   Jacalyn Lefevre, MD  spironolactone (ALDACTONE) 25 MG tablet Take 12.5 mg by mouth daily. Take half a tablet by mouth daily    [provider]  spironolactone (ALDACTONE) 50 MG tablet TAKE 1 TABLET EVERY DAY (DOSE INCREASE) 11/15/22   Kerri Perches, MD  TRUEplus Lancets 33G MISC TEST BLOOD SUGAR FOUR TIMES DAILY 01/06/22   Kerri Perches, MD  UNABLE TO FIND CPAP mask and tubing  DX G47.33 06/11/21   Kerri Perches, MD   Vitamin D, Ergocalciferol, (DRISDOL) 1.25 MG (50000 UNIT) CAPS capsule Take 1 capsule (50,000 Units total) by mouth every 7 (seven) days. 11/17/22   Kerri Perches, MD    Allergies as of 04/15/2023   (No Known Allergies)    Family History  Problem Relation Age of Onset   Hypertension Mother    Diabetes Mother    Hypertension Father    Diabetes Brother    Stroke Daughter    Arthritis Other        Family History    Diabetes Other        family History     Social History   Socioeconomic History   Marital status: Married    Spouse name: Not on file   Number of children: 2   Years of education: 11   Highest education level: 12th grade  Occupational History   Occupation: Restaurant manager, fast food     Comment: retired   Tobacco Use   Smoking status: Former    Current packs/day: 0.00    Average packs/day: 0.5 packs/day for 33.0 years (16.5 ttl pk-yrs)    Types: Cigarettes    Start date: 11/28/1969    Quit date: 11/29/2002    Years since quitting: 20.4   Smokeless tobacco: Never  Vaping Use   Vaping status: Never Used  Substance and Sexual Activity   Alcohol use: No   Drug use: No   Sexual activity: Not Currently  Other Topics Concern   Not on file  Social History Narrative   Patient drinks about 3 cups of soda daily.    Patient is left handed.    Lives alone with wife    Social Drivers of Health   Financial Resource Strain: Low Risk  (07/21/2022)   Overall Financial Resource Strain (CARDIA)    Difficulty of Paying Living Expenses: Not hard at all  Food Insecurity: Unknown (08/08/2019)   Hunger Vital Sign    Worried About Running Out of Food in the Last Year: Not on file    Ran Out of Food in the Last Year: Never true  Transportation Needs: No Transportation Needs (07/21/2022)   PRAPARE - Administrator, Civil Service (Medical): No    Lack of Transportation (Non-Medical): No  Physical Activity: Inactive (08/08/2020)   Exercise Vital Sign    Days of  Exercise per Week: 0 days  Minutes of Exercise per Session: 0 min  Stress: No Stress Concern Present (08/08/2019)   [employer] of Occupational Health - Occupational Stress Questionnaire    Feeling of Stress : Not at all  Social Connections: Moderately Integrated (08/08/2020)   Social Connection and Isolation Panel [NHANES]    Frequency of Communication with Friends and Family: Three times a week    Frequency of Social Gatherings with Friends and Family: Three times a week    Attends Religious Services: 1 to 4 times per year    Active Member of Clubs or Organizations: No    Attends Banker Meetings: Never    Marital Status: Married  Catering manager Violence: Not At Risk (06/29/2017)   Humiliation, Afraid, Rape, and Kick questionnaire    Fear of Current or Ex-Partner: No    Emotionally Abused: No    Physically Abused: No    Sexually Abused: No    Review of Systems: See HPI, otherwise negative ROS  Physical Exam: Vital signs in last 24 hours:     General:   Alert,  Well-developed, well-nourished, pleasant and cooperative in NAD Head:  Normocephalic and atraumatic. Eyes:  Sclera clear, no icterus.   Conjunctiva pink. Ears:  Normal auditory acuity. Nose:  No deformity, discharge,  or lesions. Msk:  Symmetrical without gross deformities. Normal posture. Extremities:  Without clubbing or edema. Neurologic:  Alert and  oriented x4;  grossly normal neurologically. Skin:  Intact without significant lesions or rashes. Psych:  Alert and cooperative. Normal mood and affect.  Impression/Plan:  YAFET SKEES is a 82 y.o. male with past medical history of coronary artery disease, CHF, COPD, CVA, seizure disorder, OSA, diabetes, hypertension, hyperlipidemia, who presents for evaluation of diarrhea . elevated antibodies concerning for celiac disease . Proceed with Egd with small bowel biopsies   The risks of the procedure including infection, bleed, or perforation as  well as benefits, limitations, alternatives and imponderables have been reviewed with the patient. Questions have been answered. All parties agreeable.

## 2023-05-15 NOTE — Transfer of Care (Signed)
Immediate Anesthesia Transfer of Care Note  Patient: [name]  Procedure(s) Performed: ESOPHAGOGASTRODUODENOSCOPY (EGD) WITH PROPOFOL BIOPSY  Patient Location: Short Stay  Anesthesia Type:General  Level of Consciousness: awake  Airway & Oxygen Therapy: Patient Spontanous Breathing  Post-op Assessment: Report given to RN  Post vital signs: Reviewed and stable  Last Vitals:  Vitals Value Taken Time  BP 105/55 05/15/23 0903  Temp 36.6 C 05/15/23 0903  Pulse 71 05/15/23 0903  Resp 18 05/15/23 0903  SpO2 95 % 05/15/23 0903    Last Pain:  Vitals:   05/15/23 0903  TempSrc: Oral  PainSc: 0-No pain      Patients Stated Pain Goal: 6 (05/15/23 0813)  Complications: No notable events documented.

## 2023-05-15 NOTE — Discharge Instructions (Signed)

## 2023-05-18 LAB — SURGICAL PATHOLOGY

## 2023-05-19 ENCOUNTER — Encounter (INDEPENDENT_AMBULATORY_CARE_PROVIDER_SITE_OTHER): Payer: Self-pay | Admitting: Gastroenterology

## 2023-05-19 NOTE — Progress Notes (Signed)
I reviewed the pathology results. [name], can you send her a letter with the findings as described below please?  Thanks,  Vista Lawman, MD Gastroenterology and Hepatology [facility]  ---------------------------------------------------------------------------------------------  [facility] [address], [place], [place] [address] Phone:  [phone]   05/19/23 [place], [place]   Dear [name],  I am writing to inform you that the biopsies taken during your recent endoscopic examination showed:  No H. Pylori bacteria in stomach ,  Although did show changes to the stomach mucosa ( Intestinal metaplasia)   Normal biopsies of the small bowel  ( no celiac disease)   Also I value your feedback , so if you get a survey , please take the time to fill it out and thank you for choosing [facility]  Please call us at [phone] if you have persistent problems or have questions about your condition that have not been fully answered at this time.  Sincerely,  Vista Lawman, MD Gastroenterology and Hepatology

## 2023-05-21 ENCOUNTER — Encounter (INDEPENDENT_AMBULATORY_CARE_PROVIDER_SITE_OTHER): Payer: Self-pay | Admitting: *Deleted

## 2023-05-26 ENCOUNTER — Ambulatory Visit (INDEPENDENT_AMBULATORY_CARE_PROVIDER_SITE_OTHER): Payer: Medicare HMO | Admitting: Gastroenterology

## 2023-05-26 ENCOUNTER — Encounter (INDEPENDENT_AMBULATORY_CARE_PROVIDER_SITE_OTHER): Payer: Self-pay | Admitting: Gastroenterology

## 2023-05-26 VITALS — BP 149/75 | HR 64 | Temp 97.1°F | Ht 70.0 in | Wt 249.5 lb

## 2023-05-26 DIAGNOSIS — K921 Melena: Secondary | ICD-10-CM

## 2023-05-26 DIAGNOSIS — Z6835 Body mass index (BMI) 35.0-35.9, adult: Secondary | ICD-10-CM

## 2023-05-26 DIAGNOSIS — K529 Noninfective gastroenteritis and colitis, unspecified: Secondary | ICD-10-CM

## 2023-05-26 DIAGNOSIS — D649 Anemia, unspecified: Secondary | ICD-10-CM

## 2023-05-26 DIAGNOSIS — K31A19 Gastric intestinal metaplasia without dysplasia, unspecified site: Secondary | ICD-10-CM

## 2023-05-26 DIAGNOSIS — R197 Diarrhea, unspecified: Secondary | ICD-10-CM

## 2023-05-26 NOTE — Progress Notes (Signed)
 [name], M.D. Gastroenterology & Hepatology [facility] [address] [place], [place] [address]  Primary Care Physician: Antonetta Rollene BRAVO, MD [address] [place] [address]  I will communicate my assessment and recommendations to the referring MD via EMR.   History of Present Illness: [name] is a 83 y.o. male with past medical history of coronary artery disease, CHF, COPD, CVA, seizure disorder, OSA, diabetes, hypertension, hyperlipidemia, who presents for evaluation of diarrhea hematochezia s/p EGD and colonoscopy.  Patient reports diarrhea has resolved he is having 1 bowel movement Bristol stool 4 every day without difficulty.  Reports after starting Benefiber things are better now.  No further hematochezia resolved since controlling constipation.  Patient was found to have slightly abnormal celiac panel and underwent endoscopy with small bowel biopsies negative for celiac disease.  Although he was found to have intestinal metaplasia.The patient denies having any nausea, vomiting, fever, chills, hematochezia, melena, hematemesis, abdominal distention, abdominal pain, jaundice, pruritus or weight loss.  Last EGD 04/2023 - Normal esophagus. - Atrophic gastritis. Biopsied. - Duodenal mucosal lymphangiectasia.  A. SMALL BOWEL, BIOPSY:  Duodenal mucosa with normal villous architecture.  No villous atrophy or increased intraepithelial lymphocytes.   B. STOMACH, RANDOM, BIOPSY:  Gastric mucosa with slight chronic inflammation and intestinal  metaplasia.  Negative for dysplasia or malignancy.  Negative for Helicobacter pylori.   Last Colonoscopy: 03/12/2023 5 polyps between 3 to 7 mm were removed from the transverse colon and hepatic flexure with a cold snare, diverticulosis in the left colon, internal and external hemorrhoids were seen.  Polyps were tubular adenomas and 1 inflammatory polyp.  Given age,  no repeat colonoscopy was recommended.   Normal IgA deficiency but elevated antigliadin IgA (37.5) also normal TTG IgA CRP 44.7.   Hemoglobin 11.7 ferritin 184, normal B12 and folate levels   Past Medical History: Past Medical History:  Diagnosis Date   Abnormality of gait 05/01/2015   Anemia 04/19/2015   Annual physical exam 12/11/2013   Arthritis    left leg; knees (05/07/2017)   Arthritis of knee    CAD (coronary artery disease)    CHF (congestive heart failure) (HCC)    Phreesia 08/08/2020   Choking 07/13/2015   Chronic bronchitis (HCC)    get it q yr   Chronic lower back pain    since I was a teen (05/07/2017)   COPD (chronic obstructive pulmonary disease) (HCC)    CVA (cerebral vascular accident) (HCC) 05/2015   R sided weakness (05/07/2017)   Diabetes mellitus without complication (HCC)    Phreesia 08/08/2020   GERD (gastroesophageal reflux disease)    Gout    History of colonic polyps 04/03/2017   Added automatically from request for surgery [id number]   Hyperlipidemia    Hypertension    Myocardial infarction (HCC) 1999   Obesity    OSA on CPAP    Oxygen  deficiency    Phreesia 08/08/2020   Pneumonia    a few times; last time was ~ 06/2013 (05/07/2017)   Psoriasis    Stroke (HCC)    Phreesia 08/08/2020   Thyroid  disease    Phreesia 08/08/2020   Tussive syncope 05/01/2015   Type II diabetes mellitus (HCC)    Vitamin B12 deficiency 05/01/2015    Past Surgical History: Past Surgical History:  Procedure Laterality Date   APPENDECTOMY     CATARACT EXTRACTION W/PHACO Left 10/07/2016   Procedure: CATARACT EXTRACTION PHACO AND INTRAOCULAR LENS  PLACEMENT (IOC);  Surgeon: Roz Anes, MD;  Location: [facility];  Service: Ophthalmology;  Laterality: Left;  CDE: 13.49   CATARACT EXTRACTION W/PHACO Right 10/28/2016   Procedure: CATARACT EXTRACTION PHACO AND INTRAOCULAR LENS PLACEMENT (IOC);  Surgeon: Roz Anes, MD;  Location: [facility];  Service: Ophthalmology;   Laterality: Right;  CDE: 16.71   COLONOSCOPY  03/17/2012   Procedure: COLONOSCOPY;  Surgeon: Claudis RAYMOND Rivet, MD;  Location: [facility];  Service: Endoscopy;  Laterality: N/A;  830   COLONOSCOPY N/A 06/25/2017   Procedure: COLONOSCOPY;  Surgeon: Rivet Claudis RAYMOND, MD;  Location: [facility];  Service: Endoscopy;  Laterality: N/A;  930   COLONOSCOPY WITH PROPOFOL  N/A 03/12/2023   Procedure: COLONOSCOPY WITH PROPOFOL ;  Surgeon: Cinderella Deatrice FALCON, MD;  Location: [facility];  Service: Endoscopy;  Laterality: N/A;  9:30am;asa 3   CORONARY ANGIOPLASTY WITH STENT PLACEMENT  1999- 2009-08/30/2013   counting today's, I have 5 stents (08/30/2013)   CORONARY ANGIOPLASTY WITH STENT PLACEMENT  05/07/2017   CORONARY STENT INTERVENTION N/A 05/07/2017   Procedure: CORONARY STENT INTERVENTION;  Surgeon: Levern Hutching, MD;  [facility]: [facility];  Service: Cardiovascular;  Laterality: N/A;   CYSTOSCOPY WITH URETHRAL DILATATION N/A 11/26/2012   Procedure: CYSTOSCOPY WITH URETHRAL DILATATION;  Surgeon: Norleen JINNY Seltzer, MD;  Location: [facility];  Service: Urology;  Laterality: N/A;   LEFT HEART CATH AND CORONARY ANGIOGRAPHY N/A 05/07/2017   Procedure: LEFT HEART CATH AND CORONARY ANGIOGRAPHY;  Surgeon: Levern Hutching, MD;  Location: [facility];  Service: Cardiovascular;  Laterality: N/A;   LEFT HEART CATHETERIZATION WITH CORONARY ANGIOGRAM N/A 08/30/2013   Procedure: LEFT HEART CATHETERIZATION WITH CORONARY ANGIOGRAM;  Surgeon: Hutching LOISE Levern, MD;  Location: [facility];  Service: Cardiovascular;  Laterality: N/A;   PERCUTANEOUS CORONARY STENT INTERVENTION (PCI-S) Right 08/30/2013   Procedure: PERCUTANEOUS CORONARY STENT INTERVENTION (PCI-S);  Surgeon: Hutching LOISE Levern, MD;  Location: [facility] CATH LAB;  Service: Cardiovascular;  Laterality: Right;   POLYPECTOMY  03/12/2023   Procedure: POLYPECTOMY;  Surgeon: Cinderella Deatrice FALCON, MD;  [facility]: [facility];  Service: Endoscopy;;    Family History: Family  History  Problem Relation Age of Onset   Hypertension Mother    Diabetes Mother    Hypertension Father    Diabetes Brother    Stroke Daughter    Arthritis Other        Family History    Diabetes Other        family History     Social History: Social History   Tobacco Use  Smoking Status Former   Current packs/day: 0.00   Average packs/day: 0.5 packs/day for 33.0 years (16.5 ttl pk-yrs)   Types: Cigarettes   Start date: 11/28/1969   Quit date: 11/29/2002   Years since quitting: 20.5   Passive exposure: Past  Smokeless Tobacco Never   Social History   Substance and Sexual Activity  Alcohol Use No   Social History   Substance and Sexual Activity  Drug Use No    Allergies: No Known Allergies  Medications: Current Outpatient Medications  Medication Sig Dispense Refill   acetaminophen  (TYLENOL ) 500 MG tablet Take 1,000 mg by mouth every 6 (six) hours as needed for moderate pain (left thigh).      albuterol  (VENTOLIN  HFA) 108 (90 Base) MCG/ACT inhaler INHALE 1 TO 2 PUFFS EVERY 6 HOURS AS NEEDED FOR WHEEZING OR SHORTNESS OF BREATH (Patient taking differently: Inhale 2 puffs into the lungs every 6 (  six) hours as needed for shortness of breath or wheezing.) 1 each 10   allopurinol  (ZYLOPRIM ) 300 MG tablet TAKE 1 TABLET EVERY DAY 90 tablet 3   aspirin  EC 81 MG tablet Take 81 mg by mouth daily.     Blood Glucose Calibration (TRUE METRIX LEVEL 1) Low SOLN Use as directed 1 each 0   Blood Glucose Monitoring Suppl (TRUE METRIX METER) w/Device KIT USE AS DIRECTED 1 kit 0   clobetasol  ointment (TEMOVATE ) 0.05 % Apply twice daily to affected areas for 14 days then once daily, as needed 69 g 1   clopidogrel  (PLAVIX ) 75 MG tablet TAKE 1 TABLET EVERY DAY 90 tablet 3   DROPLET INSULIN  SYRINGE 31G X 5/16 1 ML MISC USE AS DIRECTED IN THE MORNING AND AT BEDTIME 100 each 0   ezetimibe  (ZETIA ) 10 MG tablet TAKE 1 TABLET EVERY DAY 90 tablet 3   glucose blood (TRUE METRIX BLOOD GLUCOSE TEST)  test strip TEST BLOOD SUGAR FOUR TIMES DAILY 400 strip 3   insulin  NPH-regular Human (NOVOLIN  70/30) (70-30) 100 UNIT/ML injection Inject 35-45 Units into the skin 2 (two) times daily with a meal. Take 45 units with breakfast and 35 units with supper if glucose is above 90 and eating. 90 mL 3   isosorbide  mononitrate (IMDUR ) 30 MG 24 hr tablet TAKE 1 TABLET EVERY DAY 90 tablet 3   levETIRAcetam  (KEPPRA ) 500 MG tablet TAKE 1 TABLET TWICE DAILY 180 tablet 3   levothyroxine  (SYNTHROID ) 112 MCG tablet Take 1 tablet (112 mcg total) by mouth daily. 90 tablet 3   metoprolol  tartrate (LOPRESSOR ) 50 MG tablet TAKE 1/2 TABLET TWICE DAILY 90 tablet 3   nitroGLYCERIN  (NITROSTAT ) 0.4 MG SL tablet Place 1 tablet (0.4 mg total) under the tongue every 5 (five) minutes as needed for chest pain. 100 tablet 3   pantoprazole  (PROTONIX ) 40 MG tablet TAKE 1 TABLET EVERY DAY BEFORE BREAKFAST 90 tablet 3   polyethylene glycol (MIRALAX  / GLYCOLAX ) 17 g packet Take 17 g by mouth daily as needed. 14 each 0   pravastatin  (PRAVACHOL ) 40 MG tablet TAKE 2 TABLETS EVERY DAY 180 tablet 3   Probiotic Product (ALIGN) 4 MG CAPS Take 1 capsule (4 mg total) by mouth daily. 30 capsule 0   spironolactone  (ALDACTONE ) 50 MG tablet TAKE 1 TABLET EVERY DAY (DOSE INCREASE) 90 tablet 3   TRUEplus Lancets 33G MISC TEST BLOOD SUGAR FOUR TIMES DAILY 400 each 0   UNABLE TO FIND CPAP mask and tubing  DX G47.33 1 each 0   Vitamin D , Ergocalciferol , (DRISDOL ) 1.25 MG (50000 UNIT) CAPS capsule Take 1 capsule (50,000 Units total) by mouth every 7 (seven) days. 12 capsule 2   No current facility-administered medications for this visit.    Review of Systems: GENERAL: negative for malaise, night sweats HEENT: No changes in hearing or vision, no nose bleeds or other nasal problems. NECK: Negative for lumps, goiter, pain and significant neck swelling RESPIRATORY: Negative for cough, wheezing CARDIOVASCULAR: Negative for chest pain, leg swelling,  palpitations, orthopnea GI: SEE HPI MUSCULOSKELETAL: Negative for joint pain or swelling, back pain, and muscle pain. SKIN: Negative for lesions, rash PSYCH: Negative for sleep disturbance, mood disorder and recent psychosocial stressors. HEMATOLOGY Negative for prolonged bleeding, bruising easily, and swollen nodes. ENDOCRINE: Negative for cold or heat intolerance, polyuria, polydipsia and goiter. NEURO: negative for tremor, gait imbalance, syncope and seizures. The remainder of the review of systems is noncontributory.   Physical Exam: BP (!) 149/75  Pulse 64   Temp (!) 97.1 F (36.2 C) (Skin)   Ht 5' 10 (1.778 m)   Wt 249 lb 8 oz (113.2 kg)   BMI 35.80 kg/m  GENERAL: The patient is AO x3, in no acute distress. HEENT: Head is normocephalic and atraumatic. EOMI are intact. Mouth is well hydrated and without lesions. NECK: Supple. No masses LUNGS: Clear to auscultation. No presence of rhonchi/wheezing/rales. Adequate chest expansion HEART: RRR, normal s1 and s2. ABDOMEN: Soft, nontender, no guarding, no peritoneal signs, and nondistended. BS +. No masses. EXTREMITIES: Without any cyanosis, clubbing, rash, lesions or edema. NEUROLOGIC: AOx3, no focal motor deficit. SKIN: no jaundice, no rashes  Imaging/Labs: as above  I personally reviewed and interpreted the available labs, imaging and endoscopic files.  Impression and Plan:  [name] is a 83 y.o. male with past medical history of coronary artery disease, CHF, COPD, CVA, seizure disorder, OSA, diabetes, hypertension, hyperlipidemia, who presents for evaluation of diarrhea hematochezia s/p EGD and colonoscopy. Diarrhea and hematochezia has resolved now after fiber supplementation   Recently underwent colonoscopy without presence of abnormalities that would explain endoscopically his diarrhea.  Upper endoscopy negative for celiac disease but positive for intestinal metaplasia.  GI PCR negative .For now, I advised him to  cotinue taking Benefiber on a regular basis to increase the bulk of his stool.  Regarding intestinal metaplasia discussed with patient and wife indication for further endoscopy with mapping biopsies.  Given no family history of gastric cancer and advanced age they would like to defer surveillance upper endoscopy for intestinal metaplasia at this time.  May re-discussed in 3 years regarding mapping biopsies   All questions were answered.      John Fortune, MD Gastroenterology and Hepatology [facility]astroenter[facility]

## 2023-05-26 NOTE — Patient Instructions (Signed)
 It was very nice to meet you today, as dicussed with will plan for the following :  1) continue benefiber  2) Will consider upper endoscopy in 3 years

## 2023-05-29 ENCOUNTER — Encounter (HOSPITAL_COMMUNITY): Payer: Self-pay | Admitting: Gastroenterology

## 2023-06-08 ENCOUNTER — Ambulatory Visit (INDEPENDENT_AMBULATORY_CARE_PROVIDER_SITE_OTHER): Payer: Medicare HMO | Admitting: Gastroenterology

## 2023-06-09 ENCOUNTER — Ambulatory Visit: Payer: Medicare HMO | Admitting: Family Medicine

## 2023-06-09 ENCOUNTER — Encounter: Payer: Self-pay | Admitting: Family Medicine

## 2023-06-09 VITALS — BP 128/72 | HR 81 | Ht 70.0 in | Wt 246.0 lb

## 2023-06-09 DIAGNOSIS — R569 Unspecified convulsions: Secondary | ICD-10-CM | POA: Diagnosis not present

## 2023-06-09 DIAGNOSIS — J449 Chronic obstructive pulmonary disease, unspecified: Secondary | ICD-10-CM | POA: Diagnosis not present

## 2023-06-09 DIAGNOSIS — R269 Unspecified abnormalities of gait and mobility: Secondary | ICD-10-CM | POA: Diagnosis not present

## 2023-06-09 DIAGNOSIS — G4733 Obstructive sleep apnea (adult) (pediatric): Secondary | ICD-10-CM

## 2023-06-09 DIAGNOSIS — E559 Vitamin D deficiency, unspecified: Secondary | ICD-10-CM

## 2023-06-09 DIAGNOSIS — E790 Hyperuricemia without signs of inflammatory arthritis and tophaceous disease: Secondary | ICD-10-CM | POA: Diagnosis not present

## 2023-06-09 DIAGNOSIS — L408 Other psoriasis: Secondary | ICD-10-CM

## 2023-06-09 DIAGNOSIS — L853 Xerosis cutis: Secondary | ICD-10-CM

## 2023-06-09 DIAGNOSIS — E118 Type 2 diabetes mellitus with unspecified complications: Secondary | ICD-10-CM | POA: Diagnosis not present

## 2023-06-09 DIAGNOSIS — E785 Hyperlipidemia, unspecified: Secondary | ICD-10-CM

## 2023-06-09 DIAGNOSIS — Z794 Long term (current) use of insulin: Secondary | ICD-10-CM

## 2023-06-09 DIAGNOSIS — E038 Other specified hypothyroidism: Secondary | ICD-10-CM | POA: Diagnosis not present

## 2023-06-09 DIAGNOSIS — I10 Essential (primary) hypertension: Secondary | ICD-10-CM | POA: Diagnosis not present

## 2023-06-09 NOTE — Assessment & Plan Note (Signed)
Controlled and managed by Endo 

## 2023-06-09 NOTE — Assessment & Plan Note (Addendum)
Hyperlipidemia:Low fat diet discussed and encouraged.   Lipid Panel  Lab Results  Component Value Date   CHOL 129 09/30/2022   HDL 30 (L) 09/30/2022   LDLCALC 72 09/30/2022   LDLDIRECT 50 08/18/2014   TRIG 155 (H) 09/30/2022   CHOLHDL 4.3 09/30/2022     Updated lab needed at/ before next visit. Needs to reduce fried and fatty foods Hyperlipidemia:Low fat diet discussed and encouraged.   Lipid Panel  Lab Results  Component Value Date   CHOL 129 09/30/2022   HDL 30 (L) 09/30/2022   LDLCALC 72 09/30/2022   LDLDIRECT 50 08/18/2014   TRIG 155 (H) 09/30/2022   CHOLHDL 4.3 09/30/2022

## 2023-06-09 NOTE — Patient Instructions (Addendum)
Annual exam in 5 months, call if you need me sooner  You are referred to localSpecialist for sleep apnea    Excellent blood pressure , no med change  Lipid, hepatic , Vit D today and uric acid level  You do need covid vaccine and shingles vaccines both are at the pharmacy at no cost  Use lotion with oil mixed in it twice daily to skin , since it is very dry  Please do NOT traumatize skin tag , they may drop off on their own  Use walker so you do not fall, and no more falling off bed  Eat regularly so blood sugar does not fall  Thanks for choosing [facility], we consider it a privelige to serve you.

## 2023-06-09 NOTE — Assessment & Plan Note (Signed)
Diabetes associated with hypertension, hyperlipidemia, obesity, and CKD  Mr. [name] is reminded of the importance of commitment to daily physical activity for 30 minutes or more, as able and the need to limit carbohydrate intake to 30 to 60 grams per meal to help with blood sugar control Controlled and managed by endo.   The need to take medication as prescribed, test blood sugar as directed, and to call between visits if there is a concern that blood sugar is uncontrolled is also discussed.   Mr. [name] is reminded of the importance of daily foot exam, annual eye examination, and good blood sugar, blood pressure and cholesterol control.     Latest Ref Rng & Units 05/11/2023    1:33 PM 01/30/2023   10:42 AM 01/21/2023    9:54 AM 11/15/2022    5:56 PM 11/14/2022   10:27 AM  Diabetic Labs  HbA1c 4.0 - 5.6 %  6.1      Creatinine 0.61 - 1.24 mg/dL 3.08   6.57  8.46  9.62       06/09/2023    9:49 AM 06/09/2023    9:35 AM 06/09/2023    9:34 AM 05/26/2023   11:26 AM 05/26/2023   11:24 AM 05/15/2023    9:03 AM 05/15/2023    8:13 AM  BP/Weight  Systolic BP 128 155 157 149 146 105 154  Diastolic BP 72 76 80 75 75 55 79  Wt. (Lbs)   246  249.5  248.68  BMI   35.3 kg/m2  35.8 kg/m2  35.68 kg/m2      03/16/2023    1:18 PM 07/30/2022   10:00 AM  Foot/eye exam completion dates  Eye Exam --       Foot Form Completion  Done     This result is from an external source.

## 2023-06-09 NOTE — Progress Notes (Signed)
[name]     MRN: [id number]      DOB: July 19, 1940  Chief Complaint  Patient presents with   Follow-up    Follow up    HPI [name] is here for follow up and re-evaluation of chronic medical conditions, medication management and review of any available recent lab and radiology data.  Preventive health is updated, specifically  Cancer screening and Immunization.   Questions or concerns regarding consultations or procedures which the PT has had in the interim are  addressed. The PT denies any adverse reactions to current medications since the last visit.  There are no new concerns.  There are no specific complaints   ROS Denies recent fever or chills. Denies sinus pressure, nasal congestion, ear pain or sore throat. Denies chest congestion, productive cough or wheezing. Denies chest pains, palpitations and leg swelling Denies abdominal pain, nausea, vomiting,diarrhea or constipation.   Denies dysuria, frequency, hesitancy or incontinence. Chronic  joint pain, swelling and limitation in mobility. Denies headaches, seizures, numbness, or tingling. Denies depression, anxiety or insomnia. Denies skin break down or rash.c/o skin tags   PE  BP 128/72   Pulse 81   Ht 5\' 10"  (1.778 m)   Wt 246 lb (111.6 kg)   SpO2 90%   BMI 35.30 kg/m   Patient alert and oriented and in no cardiopulmonary distress.  HEENT: No facial asymmetry, EOMI,     Neck supple .  Chest: Clear to auscultation bilaterally.decreased airentry  CVS: S1, S2 no murmurs, no S3.Regular rate.  ABD: Soft non tender.   Ext: No edema  MS: decreased  ROM spine, shoulders, hips and knees.  Skin: Intact, dry, skin tags present  Psych: Good eye contact, normal affect. Memory intact not anxious or depressed appearing.  CNS: CN 2-12 intact, power,  normal throughout.no focal deficits noted.   Assessment & Plan  Essential hypertension Controlled, no change in medication DASH diet and commitment to  daily physical activity for a minimum of 30 minutes discussed and encouraged, as a part of hypertension management. The importance of attaining a healthy weight is also discussed.     06/09/2023    9:49 AM 06/09/2023    9:35 AM 06/09/2023    9:34 AM 05/26/2023   11:26 AM 05/26/2023   11:24 AM 05/15/2023    9:03 AM 05/15/2023    8:13 AM  BP/Weight  Systolic BP 128 155 157 149 146 105 154  Diastolic BP 72 76 80 75 75 55 79  Wt. (Lbs)   246  249.5  248.68  BMI   35.3 kg/m2  35.8 kg/m2  35.68 kg/m2       Hyperlipidemia LDL goal <70 Hyperlipidemia:Low fat diet discussed and encouraged.   Lipid Panel  Lab Results  Component Value Date   CHOL 129 09/30/2022   HDL 30 (L) 09/30/2022   LDLCALC 72 09/30/2022   LDLDIRECT 50 08/18/2014   TRIG 155 (H) 09/30/2022   CHOLHDL 4.3 09/30/2022     Updated lab needed at/ before next visit. Needs to reduce fried and fatty foods Hyperlipidemia:Low fat diet discussed and encouraged.   Lipid Panel  Lab Results  Component Value Date   CHOL 129 09/30/2022   HDL 30 (L) 09/30/2022   LDLCALC 72 09/30/2022   LDLDIRECT 50 08/18/2014   TRIG 155 (H) 09/30/2022   CHOLHDL 4.3 09/30/2022       Hypothyroidism Controlled and managed by Endo  Partial seizure (HCC) No seizure activity reported  Morbid obesity (HCC)  Patient re-educated about  the importance of commitment to a  minimum of 150 minutes of exercise per week as able.  The importance of healthy food choices with portion control discussed, as well as eating regularly and within a 12 hour window most days. The need to choose "clean , green" food 50 to 75% of the time is discussed, as well as to make water the primary drink and set a goal of 64 ounces water daily.       06/09/2023    9:34 AM 05/26/2023   11:24 AM 05/15/2023    8:13 AM  Weight /BMI  Weight 246 lb 249 lb 8 oz 248 lb 10.9 oz  Height 5\' 10"  (1.778 m) 5\' 10"  (1.778 m) 5\' 10"  (1.778 m)  BMI 35.3 kg/m2 35.8 kg/m2 35.68  kg/m2    Associated with HTN, DM , hyperlipidemia  Abnormality of gait Home safety discussed pt uses assistive device ,  Controlled diabetes mellitus type 2 with complications (HCC) Diabetes associated with hypertension, hyperlipidemia, obesity, and CKD  [name] is reminded of the importance of commitment to daily physical activity for 30 minutes or more, as able and the need to limit carbohydrate intake to 30 to 60 grams per meal to help with blood sugar control Controlled and managed by endo.   The need to take medication as prescribed, test blood sugar as directed, and to call between visits if there is a concern that blood sugar is uncontrolled is also discussed.   [name] is reminded of the importance of daily foot exam, annual eye examination, and good blood sugar, blood pressure and cholesterol control.     Latest Ref Rng & Units 05/11/2023    1:33 PM 01/30/2023   10:42 AM 01/21/2023    9:54 AM 11/15/2022    5:56 PM 11/14/2022   10:27 AM  Diabetic Labs  HbA1c 4.0 - 5.6 %  6.1      Creatinine 0.61 - 1.24 mg/dL 4.09   8.11  9.14  7.82       06/09/2023    9:49 AM 06/09/2023    9:35 AM 06/09/2023    9:34 AM 05/26/2023   11:26 AM 05/26/2023   11:24 AM 05/15/2023    9:03 AM 05/15/2023    8:13 AM  BP/Weight  Systolic BP 128 155 157 149 146 105 154  Diastolic BP 72 76 80 75 75 55 79  Wt. (Lbs)   246  249.5  248.68  BMI   35.3 kg/m2  35.8 kg/m2  35.68 kg/m2      03/16/2023    1:18 PM 07/30/2022   10:00 AM  Foot/eye exam completion dates  Eye Exam --       Foot Form Completion  Done     This result is from an external source.

## 2023-06-09 NOTE — Assessment & Plan Note (Signed)
Currently controlled  with topical steroid as needed

## 2023-06-09 NOTE — Assessment & Plan Note (Signed)
Advised to use oil and lotion twice daily

## 2023-06-09 NOTE — Addendum Note (Signed)
Addended by: Jasper Riling on: 06/09/2023 10:17 AM   Modules accepted: Orders

## 2023-06-09 NOTE — Assessment & Plan Note (Signed)
Uses oxygen as needed.

## 2023-06-09 NOTE — Assessment & Plan Note (Signed)
No seizure activity reported °

## 2023-06-09 NOTE — Assessment & Plan Note (Signed)
Controlled, no change in medication DASH diet and commitment to daily physical activity for a minimum of 30 minutes discussed and encouraged, as a part of hypertension management. The importance of attaining a healthy weight is also discussed.     06/09/2023    9:49 AM 06/09/2023    9:35 AM 06/09/2023    9:34 AM 05/26/2023   11:26 AM 05/26/2023   11:24 AM 05/15/2023    9:03 AM 05/15/2023    8:13 AM  BP/Weight  Systolic BP 128 155 157 149 146 105 154  Diastolic BP 72 76 80 75 75 55 79  Wt. (Lbs)   246  249.5  248.68  BMI   35.3 kg/m2  35.8 kg/m2  35.68 kg/m2

## 2023-06-09 NOTE — Assessment & Plan Note (Signed)
Reports using  all the time , needs to be re evaluated

## 2023-06-09 NOTE — Assessment & Plan Note (Signed)
Home safety discussed pt uses assistive device ,

## 2023-06-09 NOTE — Assessment & Plan Note (Signed)
  Patient re-educated about  the importance of commitment to a  minimum of 150 minutes of exercise per week as able.  The importance of healthy food choices with portion control discussed, as well as eating regularly and within a 12 hour window most days. The need to choose "clean , green" food 50 to 75% of the time is discussed, as well as to make water the primary drink and set a goal of 64 ounces water daily.       06/09/2023    9:34 AM 05/26/2023   11:24 AM 05/15/2023    8:13 AM  Weight /BMI  Weight 246 lb 249 lb 8 oz 248 lb 10.9 oz  Height 5\' 10"  (1.778 m) 5\' 10"  (1.778 m) 5\' 10"  (1.778 m)  BMI 35.3 kg/m2 35.8 kg/m2 35.68 kg/m2    Associated with HTN, DM , hyperlipidemia

## 2023-06-10 LAB — LIPID PANEL
Chol/HDL Ratio: 3.4 {ratio} (ref 0.0–5.0)
Cholesterol, Total: 96 mg/dL — ABNORMAL LOW (ref 100–199)
HDL: 28 mg/dL — ABNORMAL LOW (ref >39)
LDL Chol Calc (NIH): 47 mg/dL (ref 0–99)
Triglycerides: 115 mg/dL (ref 0–149)
VLDL Cholesterol Cal: 21 mg/dL (ref 5–40)

## 2023-06-10 LAB — HEPATIC FUNCTION PANEL
ALT: 8 [IU]/L (ref 0–44)
AST: 11 [IU]/L (ref 0–40)
Albumin: 3.8 g/dL (ref 3.7–4.7)
Alkaline Phosphatase: 93 [IU]/L (ref 44–121)
Bilirubin Total: 0.3 mg/dL (ref 0.0–1.2)
Bilirubin, Direct: 0.12 mg/dL (ref 0.00–0.40)
Total Protein: 7 g/dL (ref 6.0–8.5)

## 2023-06-10 LAB — URIC ACID: Uric Acid: 4.3 mg/dL (ref 3.8–8.4)

## 2023-06-10 LAB — VITAMIN D 25 HYDROXY (VIT D DEFICIENCY, FRACTURES): Vit D, 25-Hydroxy: 70.6 ng/mL (ref 30.0–100.0)

## 2023-06-24 ENCOUNTER — Other Ambulatory Visit: Payer: Self-pay | Admitting: Family Medicine

## 2023-07-29 DIAGNOSIS — H401134 Primary open-angle glaucoma, bilateral, indeterminate stage: Secondary | ICD-10-CM | POA: Diagnosis not present

## 2023-07-29 DIAGNOSIS — E119 Type 2 diabetes mellitus without complications: Secondary | ICD-10-CM | POA: Diagnosis not present

## 2023-07-29 DIAGNOSIS — H43823 Vitreomacular adhesion, bilateral: Secondary | ICD-10-CM | POA: Diagnosis not present

## 2023-07-29 DIAGNOSIS — H35033 Hypertensive retinopathy, bilateral: Secondary | ICD-10-CM | POA: Diagnosis not present

## 2023-07-29 LAB — HM DIABETES EYE EXAM

## 2023-07-30 ENCOUNTER — Encounter: Payer: Self-pay | Admitting: Nurse Practitioner

## 2023-07-30 ENCOUNTER — Ambulatory Visit (INDEPENDENT_AMBULATORY_CARE_PROVIDER_SITE_OTHER): Payer: Medicare HMO | Admitting: Nurse Practitioner

## 2023-07-30 ENCOUNTER — Other Ambulatory Visit: Payer: Self-pay | Admitting: *Deleted

## 2023-07-30 VITALS — BP 132/78 | HR 82 | Ht 70.0 in | Wt 245.8 lb

## 2023-07-30 DIAGNOSIS — E782 Mixed hyperlipidemia: Secondary | ICD-10-CM

## 2023-07-30 DIAGNOSIS — E559 Vitamin D deficiency, unspecified: Secondary | ICD-10-CM

## 2023-07-30 DIAGNOSIS — E039 Hypothyroidism, unspecified: Secondary | ICD-10-CM | POA: Diagnosis not present

## 2023-07-30 DIAGNOSIS — I10 Essential (primary) hypertension: Secondary | ICD-10-CM

## 2023-07-30 DIAGNOSIS — E1165 Type 2 diabetes mellitus with hyperglycemia: Secondary | ICD-10-CM | POA: Diagnosis not present

## 2023-07-30 DIAGNOSIS — Z794 Long term (current) use of insulin: Secondary | ICD-10-CM

## 2023-07-30 LAB — HEMOGLOBIN A1C: Hemoglobin A1C: 6.2

## 2023-07-30 MED ORDER — LEVOTHYROXINE SODIUM 112 MCG PO TABS: 112.0000 ug | ORAL_TABLET | Freq: Every day | ORAL | 3 refills | Status: DC

## 2023-07-30 NOTE — Progress Notes (Signed)
 07/30/2023  Endocrinology follow-up note  Subjective:    Patient ID: [name], male    DOB: 04-01-1941, PCP Kerri Perches, MD   Past Medical History:  Diagnosis Date   Abnormality of gait 05/01/2015   Anemia 04/19/2015   Annual physical exam 12/11/2013   Arthritis    "left leg; knees" (05/07/2017)   Arthritis of knee    CAD (coronary artery disease)    CHF (congestive heart failure) (HCC)    Phreesia 08/08/2020   Choking 07/13/2015   Chronic bronchitis (HCC)    "get it q yr"   Chronic lower back pain    "since I was a teen" (05/07/2017)   COPD (chronic obstructive pulmonary disease) (HCC)    CVA (cerebral vascular accident) (HCC) 05/2015   R sided weakness (05/07/2017)   Diabetes mellitus without complication (HCC)    Phreesia 08/08/2020   GERD (gastroesophageal reflux disease)    Gout    History of colonic polyps 04/03/2017   Added automatically from request for surgery [id number]   Hyperlipidemia    Hypertension    Myocardial infarction (HCC) 1999   Obesity    OSA on CPAP    Oxygen deficiency    Phreesia 08/08/2020   Pneumonia    "a few times; last time was ~ 06/2013" (05/07/2017)   Psoriasis    Stroke (HCC)    Phreesia 08/08/2020   Thyroid disease    Phreesia 08/08/2020   Tussive syncope 05/01/2015   Type II diabetes mellitus (HCC)    Vitamin B12 deficiency 05/01/2015   Past Surgical History:  Procedure Laterality Date   APPENDECTOMY     BIOPSY  05/15/2023   Procedure: BIOPSY;  Surgeon: Franky Macho, MD;  Location: [facility];  Service: Endoscopy;;   CATARACT EXTRACTION W/PHACO Left 10/07/2016   Procedure: CATARACT EXTRACTION PHACO AND INTRAOCULAR LENS PLACEMENT (IOC);  Surgeon: Jethro Bolus, MD;  Location: [facility];  Service: Ophthalmology;  Laterality: Left;  CDE: 13.49   CATARACT EXTRACTION W/PHACO Right 10/28/2016   Procedure: CATARACT EXTRACTION PHACO AND INTRAOCULAR LENS PLACEMENT (IOC);  Surgeon: Jethro Bolus, MD;  Location: [facility];   Service: Ophthalmology;  Laterality: Right;  CDE: 16.71   COLONOSCOPY  03/17/2012   Procedure: COLONOSCOPY;  Surgeon: Malissa Hippo, MD;  [facility]: [facility];  Service: Endoscopy;  Laterality: N/A;  830   COLONOSCOPY N/A 06/25/2017   Procedure: COLONOSCOPY;  Surgeon: Malissa Hippo, MD;  [facility];  Service: Endoscopy;  Laterality: N/A;  930   COLONOSCOPY WITH PROPOFOL N/A 03/12/2023   Procedure: COLONOSCOPY WITH PROPOFOL;  Surgeon: Franky Macho, MD;  [facility]: [facility];  Service: Endoscopy;  Laterality: N/A;  9:30am;asa 3   CORONARY ANGIOPLASTY WITH STENT PLACEMENT  1999- 2009-08/30/2013   "counting today's, I have 5 stents" (08/30/2013)   CORONARY ANGIOPLASTY WITH STENT PLACEMENT  05/07/2017   CORONARY STENT INTERVENTION N/A 05/07/2017   Procedure: CORONARY STENT INTERVENTION;  Surgeon: Rinaldo Cloud, MD;  [facility];  Service: Cardiovascular;  Laterality: N/A;   CYSTOSCOPY WITH URETHRAL DILATATION N/A 11/26/2012   Procedure: CYSTOSCOPY WITH URETHRAL DILATATION;  Surgeon: Anner Crete, MD;  Location: [facility];  Service: Urology;  Laterality: N/A;   ESOPHAGOGASTRODUODENOSCOPY (EGD) WITH PROPOFOL N/A 05/15/2023   Procedure: ESOPHAGOGASTRODUODENOSCOPY (EGD) WITH PROPOFOL;  Surgeon: Franky Macho, MD;  [facility];  Service: Endoscopy;  Laterality: N/A;  10:25am;asa 3   LEFT HEART CATH AND CORONARY ANGIOGRAPHY N/A 05/07/2017  Procedure: LEFT HEART CATH AND CORONARY ANGIOGRAPHY;  Surgeon: Rinaldo Cloud, MD;  [facility]: [facility];  Service: Cardiovascular;  Laterality: N/A;   LEFT HEART CATHETERIZATION WITH CORONARY ANGIOGRAM N/A 08/30/2013   Procedure: LEFT HEART CATHETERIZATION WITH CORONARY ANGIOGRAM;  Surgeon: Robynn Pane, MD;  Location: [facility];  Service: Cardiovascular;  Laterality: N/A;   PERCUTANEOUS CORONARY STENT INTERVENTION (PCI-S) Right 08/30/2013   Procedure: PERCUTANEOUS CORONARY STENT INTERVENTION (PCI-S);   Surgeon: Robynn Pane, MD;  Location: [facility];  Service: Cardiovascular;  Laterality: Right;   POLYPECTOMY  03/12/2023   Procedure: POLYPECTOMY;  Surgeon: Franky Macho, MD;  [facility];  Service: Endoscopy;;   Social History   Socioeconomic History   Marital status: Married    Spouse name: Not on file   Number of children: 2   Years of education: 11   Highest education level: 12th grade  Occupational History   Occupation: Restaurant manager, fast food     Comment: retired   Tobacco Use   Smoking status: Former    Current packs/day: 0.00    Average packs/day: 0.5 packs/day for 33.0 years (16.5 ttl pk-yrs)    Types: Cigarettes    Start date: 11/28/1969    Quit date: 11/29/2002    Years since quitting: 20.6    Passive exposure: Past   Smokeless tobacco: Never  Vaping Use   Vaping status: Never Used  Substance and Sexual Activity   Alcohol use: No   Drug use: No   Sexual activity: Not Currently  Other Topics Concern   Not on file  Social History Narrative   Patient drinks about 3 cups of soda daily.    Patient is left handed.    Lives alone with wife    Social Drivers of Health   Financial Resource Strain: Low Risk  (07/21/2022)   Overall Financial Resource Strain (CARDIA)    Difficulty of Paying Living Expenses: Not hard at all  Food Insecurity: Unknown (08/08/2019)   Hunger Vital Sign    Worried About Running Out of Food in the Last Year: Not on file    Ran Out of Food in the Last Year: Never true  Transportation Needs: No Transportation Needs (07/21/2022)   PRAPARE - Administrator, Civil Service (Medical): No    Lack of Transportation (Non-Medical): No  Physical Activity: Inactive (08/08/2020)   Exercise Vital Sign    Days of Exercise per Week: 0 days    Minutes of Exercise per Session: 0 min  Stress: No Stress Concern Present (08/08/2019)   [employer] of Occupational Health - Occupational Stress Questionnaire    Feeling of Stress : Not  at all  Social Connections: Moderately Integrated (08/08/2020)   Social Connection and Isolation Panel [NHANES]    Frequency of Communication with Friends and Family: Three times a week    Frequency of Social Gatherings with Friends and Family: Three times a week    Attends Religious Services: 1 to 4 times per year    Active Member of Clubs or Organizations: No    Attends Banker Meetings: Never    Marital Status: Married   Outpatient Encounter Medications as of 07/30/2023  Medication Sig   acetaminophen (TYLENOL) 500 MG tablet Take 1,000 mg by mouth every 6 (six) hours as needed for moderate pain (left thigh).    albuterol (VENTOLIN HFA) 108 (90 Base) MCG/ACT inhaler INHALE 1 TO 2 PUFFS EVERY 6 HOURS AS NEEDED FOR WHEEZING  OR SHORTNESS OF BREATH (Patient taking differently: Inhale 2 puffs into the lungs every 6 (six) hours as needed for shortness of breath or wheezing.)   allopurinol (ZYLOPRIM) 300 MG tablet TAKE 1 TABLET EVERY DAY   aspirin EC 81 MG tablet Take 81 mg by mouth daily.   Blood Glucose Calibration (TRUE METRIX LEVEL 1) Low SOLN Use as directed   Blood Glucose Monitoring Suppl (TRUE METRIX METER) w/Device KIT USE AS DIRECTED   clobetasol ointment (TEMOVATE) 0.05 % Apply twice daily to affected areas for 14 days then once daily, as needed   clopidogrel (PLAVIX) 75 MG tablet TAKE 1 TABLET EVERY DAY   DROPLET INSULIN SYRINGE 31G X 5/16" 1 ML MISC USE AS DIRECTED IN THE MORNING AND AT BEDTIME   ezetimibe (ZETIA) 10 MG tablet TAKE 1 TABLET EVERY DAY   glucose blood (TRUE METRIX BLOOD GLUCOSE TEST) test strip TEST BLOOD SUGAR FOUR TIMES DAILY   insulin NPH-regular Human (NOVOLIN 70/30) (70-30) 100 UNIT/ML injection Inject 35-45 Units into the skin 2 (two) times daily with a meal. Take 45 units with breakfast and 35 units with supper if glucose is above 90 and eating.   isosorbide mononitrate (IMDUR) 30 MG 24 hr tablet TAKE 1 TABLET EVERY DAY   levETIRAcetam (KEPPRA) 500 MG  tablet TAKE 1 TABLET TWICE DAILY   metoprolol tartrate (LOPRESSOR) 50 MG tablet TAKE 1/2 TABLET TWICE DAILY   pantoprazole (PROTONIX) 40 MG tablet TAKE 1 TABLET EVERY DAY BEFORE BREAKFAST   polyethylene glycol (MIRALAX / GLYCOLAX) 17 g packet Take 17 g by mouth daily as needed.   pravastatin (PRAVACHOL) 40 MG tablet TAKE 2 TABLETS EVERY DAY   Probiotic Product (ALIGN) 4 MG CAPS Take 1 capsule (4 mg total) by mouth daily.   spironolactone (ALDACTONE) 50 MG tablet TAKE 1 TABLET EVERY DAY (DOSE INCREASE)   TRUEplus Lancets 33G MISC TEST BLOOD SUGAR FOUR TIMES DAILY   UNABLE TO FIND CPAP mask and tubing  DX G47.33   Vitamin D, Ergocalciferol, (DRISDOL) 1.25 MG (50000 UNIT) CAPS capsule Take 1 capsule (50,000 Units total) by mouth every 7 (seven) days.   [DISCONTINUED] levothyroxine (SYNTHROID) 112 MCG tablet Take 1 tablet (112 mcg total) by mouth daily.   levothyroxine (SYNTHROID) 112 MCG tablet Take 1 tablet (112 mcg total) by mouth daily.   nitroGLYCERIN (NITROSTAT) 0.4 MG SL tablet Place 1 tablet (0.4 mg total) under the tongue every 5 (five) minutes as needed for chest pain.   No facility-administered encounter medications on file as of 07/30/2023.   ALLERGIES: No Known Allergies VACCINATION STATUS: Immunization History  Administered Date(s) Administered   Fluad Quad(high Dose 65+) 01/31/2019, 03/01/2020, 06/20/2021, 03/11/2022   Fluad Trivalent(High Dose 65+) 02/04/2023   Influenza Whole 03/18/2005   Influenza, High Dose Seasonal PF 02/22/2018   Influenza,inj,Quad PF,6+ Mos 03/19/2017   PFIZER(Purple Top)SARS-COV-2 Vaccination 06/25/2019, 07/16/2019, 04/22/2020   Pneumococcal Conjugate-13 12/08/2013   Pneumococcal Polysaccharide-23 06/09/2012   Td 12/04/2003   Tdap 03/06/2017    Diabetes He presents for his follow-up diabetic visit. He has type 2 diabetes mellitus. Onset time: He was diagnosed at approximate age of 50 years. His disease course has been stable. There are no  hypoglycemic associated symptoms. Pertinent negatives for hypoglycemia include no confusion, headaches, nervousness/anxiousness, pallor, seizures or tremors. Associated symptoms include weight loss. Pertinent negatives for diabetes include no chest pain, no fatigue, no polydipsia, no polyphagia, no polyuria and no weakness. There are no hypoglycemic complications. Symptoms are stable. Diabetic complications include  a CVA, heart disease, nephropathy and peripheral neuropathy. Risk factors for coronary artery disease include dyslipidemia, diabetes mellitus, male sex, obesity, tobacco exposure, sedentary lifestyle and hypertension. Current diabetic treatment includes insulin injections. He is compliant with treatment most of the time. His weight is decreasing steadily. He is following a generally healthy diet. When asked about meal planning, he reported none. He has not had a previous visit with a dietitian. He rarely participates in exercise. His home blood glucose trend is fluctuating minimally. His breakfast blood glucose range is generally 90-110 mg/dl. His dinner blood glucose range is generally 140-180 mg/dl. (He presents today with his logs, no meter, showing at goal glycemic profile overall.  His POCT A1c today is 6.2%, essentially unchanged from previous visit.  He denies any significant hypoglycemia.) An ACE inhibitor/angiotensin II receptor blocker is not being taken. He does not see a podiatrist.Eye exam is current.  Hyperlipidemia This is a chronic problem. The current episode started more than 1 year ago. The problem is uncontrolled. Recent lipid tests were reviewed and are variable (elevated triglycerides). Exacerbating diseases include chronic renal disease, diabetes, hypothyroidism and obesity. Factors aggravating his hyperlipidemia include beta blockers and fatty foods. Associated symptoms include shortness of breath. Pertinent negatives include no chest pain or myalgias. Current antihyperlipidemic  treatment includes statins. The current treatment provides moderate improvement of lipids. Compliance problems include adherence to exercise and adherence to diet (r/t decreased mobility from prior CVA).  Risk factors for coronary artery disease include diabetes mellitus, dyslipidemia, hypertension, male sex, a sedentary lifestyle and obesity.  Hypertension This is a chronic problem. The current episode started more than 1 year ago. The problem is unchanged. The problem is controlled. Associated symptoms include shortness of breath. Pertinent negatives include no chest pain, headaches, neck pain or palpitations. Agents associated with hypertension include thyroid hormones. Risk factors for coronary artery disease include dyslipidemia, diabetes mellitus, sedentary lifestyle, male gender and obesity. Past treatments include beta blockers and diuretics. The current treatment provides mild improvement. There are no compliance problems.  Hypertensive end-organ damage includes kidney disease, CAD/MI and CVA. Identifiable causes of hypertension include chronic renal disease and a thyroid problem.  Thyroid Problem Presents for follow-up visit. Symptoms include weight loss. Patient reports no anxiety, cold intolerance, constipation, depressed mood, diarrhea, fatigue, heat intolerance, palpitations, tremors or weight gain. The symptoms have been stable. His past medical history is significant for diabetes.     Review of systems  Constitutional: + steadily decreasing body weight,  current Body mass index is 35.27 kg/m. , no fatigue, no subjective hyperthermia, no subjective hypothermia Eyes: no blurry vision, no xerophthalmia ENT: no sore throat, no nodules palpated in throat, no dysphagia/odynophagia, no hoarseness Cardiovascular: no chest pain, no shortness of breath, no palpitations, no leg swelling Respiratory: no cough, no shortness of breath Gastrointestinal: no nausea/vomiting/diarrhea Musculoskeletal:  no muscle/joint aches, walks with platform walker due to mobility deficits from previous CVA Skin: no rashes, no hyperemia Neurological: no tremors, no tingling, no dizziness, numbness reported in RUE and RLE (from previous stroke) Psychiatric: no depression, no anxiety    Objective:    BP 132/78 (BP Location: Left Arm, Patient Position: Sitting, Cuff Size: Large)   Pulse 82   Ht 5\' 10"  (1.778 m)   Wt 245 lb 12.8 oz (111.5 kg)   BMI 35.27 kg/m   Wt Readings from Last 3 Encounters:  07/30/23 245 lb 12.8 oz (111.5 kg)  06/09/23 246 lb (111.6 kg)  05/26/23 249 lb 8 oz (  113.2 kg)     BP Readings from Last 3 Encounters:  07/30/23 132/78  06/09/23 128/72  05/26/23 (!) 149/75    Physical Exam- Limited  Constitutional:  Body mass index is 35.27 kg/m. , not in acute distress, normal state of mind Eyes:  EOMI, no exophthalmos Musculoskeletal: no gross deformities, walks with walker due to previous CVA Skin:  no rashes, no hyperemia Neurological: no tremor with outstretched hands  Diabetic Foot Exam - Simple   No data filed     CMP     Component Value Date/Time   NA 134 (L) 05/11/2023 1333   NA 138 01/21/2023 0954   K 3.9 05/11/2023 1333   CL 103 05/11/2023 1333   CO2 24 05/11/2023 1333   GLUCOSE 129 (H) 05/11/2023 1333   BUN 19 05/11/2023 1333   BUN 14 01/21/2023 0954   CREATININE 1.20 05/11/2023 1333   CREATININE 1.12 02/16/2020 0838   CALCIUM 9.1 05/11/2023 1333   PROT 7.0 06/09/2023 1033   ALBUMIN 3.8 06/09/2023 1033   AST 11 06/09/2023 1033   ALT 8 06/09/2023 1033   ALKPHOS 93 06/09/2023 1033   BILITOT 0.3 06/09/2023 1033   GFRNONAA >60 05/11/2023 1333   GFRNONAA 63 02/16/2020 0838   GFRAA 53 (L) 06/20/2020 0907   GFRAA 73 02/16/2020 0838     Diabetic Labs (most recent): Lab Results  Component Value Date   HGBA1C 6.2 07/30/2023   HGBA1C 6.1 (A) 01/30/2023   HGBA1C 6.0 (A) 07/30/2022   MICROALBUR 6.4 10/11/2019   MICROALBUR 4.2 07/13/2018    MICROALBUR 7.8 (H) 03/23/2014     Lipid Panel ( most recent) Lipid Panel     Component Value Date/Time   CHOL 96 (L) 06/09/2023 1033   TRIG 115 06/09/2023 1033   HDL 28 (L) 06/09/2023 1033   CHOLHDL 3.4 06/09/2023 1033   CHOLHDL 5.3 (H) 10/11/2019 0924   VLDL 35 04/20/2017 1130   LDLCALC 47 06/09/2023 1033   LDLCALC 67 10/11/2019 0924   LDLDIRECT 50 08/18/2014 0737      Assessment & Plan:   1) Type 2 diabetes mellitus with multiple competitions including diabetic nephropathy  -His diabetes is complicated by coronary artery disease, CVA, CKD and patient remains at a high risk for more acute and chronic complications of diabetes which include CAD, CVA, CKD, retinopathy, and neuropathy. These are all discussed in detail with the patient.  He presents today with his logs, no meter, showing at goal glycemic profile overall.  His POCT A1c today is 6.2%, essentially unchanged from previous visit.  He denies any significant hypoglycemia.  - Recent labs reviewed.  - Nutritional counseling repeated at each appointment due to patients tendency to fall back in to old habits.  - The patient admits there is a room for improvement in their diet and drink choices. -  Suggestion is made for the patient to avoid simple carbohydrates from their diet including Cakes, Sweet Desserts / Pastries, Ice Cream, Soda (diet and regular), Sweet Tea, Candies, Chips, Cookies, Sweet Pastries, Store Bought Juices, Alcohol in Excess of 1-2 drinks a day, Artificial Sweeteners, Coffee Creamer, and "Sugar-free" Products. This will help patient to have stable blood glucose profile and potentially avoid unintended weight gain.   - I encouraged the patient to switch to unprocessed or minimally processed complex starch and increased protein intake (animal or plant source), fruits, and vegetables.   - Patient is advised to stick to a routine mealtimes to eat 3 meals a day  and avoid unnecessary snacks (to snack only to  correct hypoglycemia).  - I have approached patient with the following individualized plan to manage diabetes and patient agrees.  - He has done reasonably very well on premixed Novolin 70/30. He wishes to stay on the same regimen for cost reasons.    -Given his stable glycemic profile and lack of hypoglycemia no changes will be made to his regimen today.  He is advised to continue Novolin 70/30  45 units with breakfast and 35 units with supper if glucose is above 90 and he is eating.   -He is advised to continue monitoring blood glucose at least 3 times per day, before injecting insulin at breakfast and supper, and before bed.  He is instructed to call the clinic if he has readings less than 70 or greater than 300 for 3 tests in a row.  -He is warned not to take insulin without proper monitoring per orders.  - Patient specific target  for A1c; LDL, HDL, Triglycerides, and  Waist Circumference were discussed in detail.  2) BP/HTN:  His blood pressure is controlled to target for his age.  He is advised to continue Metoprolol 25 mg po twice daily, Isosorbide 30 mg po daily, and Spironolactone 50 mg po daily.    3) Lipids/HPL:  His most recent lipid panel from 09/30/22 shows controlled LDL of 72 and elevated triglycerides of 155.  He is advised to continue Pravastatin 40 mg po daily at bedtime.  Side effects and precautions discussed with him.  He is advised to avoid fried foods and butter.    4)  Weight/Diet: His Body mass index is 35.27 kg/m.-  He is a candidate for modest weight loss.  He cannot exercise optimally, limited by pulmonary sufficiency due to COPD and previous CVA.  CDE consult in progress, exercise, and carbohydrates information provided.  5) Hypothyroidism:  -There are no recent TFTs to review.  He is advised to continue Levothyroxine 112 mcg po daily before breakfast.  Will recheck TFTs prior to next visit and adjust dose accordingly.   - We discussed about the correct intake  of his thyroid hormone, on empty stomach at fasting, with water, separated by at least 30 minutes from breakfast and other medications,  and separated by more than 4 hours from calcium, iron, multivitamins, acid reflux medications (PPIs). -Patient is made aware of the fact that thyroid hormone replacement is needed for life, dose to be adjusted by periodic monitoring of thyroid function tests.  6) Vitamin D deficiency His most recent vitamin D level on 06/09/23 was 70.6.  He is not sure if he is still taking his supplement as his wife manages his medication.    7) Chronic Care/Health Maintenance: -Patient is on Statin medications and encouraged to continue to follow up with Ophthalmology, nephrology, Podiatrist at least yearly or according to recommendations, and advised to stay away from smoking. I have recommended yearly flu vaccine and pneumonia vaccination at least every 5 years;  and  sleep for at least 7 hours a day.   - I advised patient to maintain close follow up with Kerri Perches, MD for primary care needs.     I spent  24  minutes in the care of the patient today including review of labs from CMP, Lipids, Thyroid Function, Hematology (current and previous including abstractions from other facilities); face-to-face time discussing  his blood glucose readings/logs, discussing hypoglycemia and hyperglycemia episodes and symptoms, medications doses, his options of  short and long term treatment based on the latest standards of care / guidelines;  discussion about incorporating lifestyle medicine;  and documenting the encounter. Risk reduction counseling performed per USPSTF guidelines to reduce obesity and cardiovascular risk factors.     Please refer to Patient Instructions for Blood Glucose Monitoring and Insulin/Medications Dosing Guide"  in media tab for additional information. Please  also refer to " Patient Self Inventory" in the Media  tab for reviewed elements of pertinent  patient history.  [name] participated in the discussions, expressed understanding, and voiced agreement with the above plans.  All questions were answered to his satisfaction. he is encouraged to contact clinic should he have any questions or concerns prior to his return visit.   Follow up plan: -Return in about 6 months (around 01/30/2024) for Diabetes F/U with A1c in office, Thyroid follow up, Previsit labs, Bring meter and logs.  Ronny Bacon, [facility] [address] [place], [place] [address] Phone: [phone] Fax: [phone]  07/30/2023, 10:39 AM

## 2023-08-23 ENCOUNTER — Emergency Department (HOSPITAL_COMMUNITY)
Admission: EM | Admit: 2023-08-23 | Discharge: 2023-08-23 | Disposition: A | Discharge status: Home / Self Care | Attending: Emergency Medicine | Admitting: Emergency Medicine

## 2023-08-23 ENCOUNTER — Encounter (HOSPITAL_COMMUNITY): Payer: Self-pay | Admitting: Emergency Medicine

## 2023-08-23 ENCOUNTER — Other Ambulatory Visit: Payer: Self-pay

## 2023-08-23 DIAGNOSIS — I2510 Atherosclerotic heart disease of native coronary artery without angina pectoris: Secondary | ICD-10-CM | POA: Insufficient documentation

## 2023-08-23 DIAGNOSIS — I509 Heart failure, unspecified: Secondary | ICD-10-CM | POA: Insufficient documentation

## 2023-08-23 DIAGNOSIS — Z87891 Personal history of nicotine dependence: Secondary | ICD-10-CM | POA: Insufficient documentation

## 2023-08-23 DIAGNOSIS — E11649 Type 2 diabetes mellitus with hypoglycemia without coma: Secondary | ICD-10-CM | POA: Diagnosis not present

## 2023-08-23 DIAGNOSIS — E162 Hypoglycemia, unspecified: Secondary | ICD-10-CM | POA: Diagnosis present

## 2023-08-23 DIAGNOSIS — I10 Essential (primary) hypertension: Secondary | ICD-10-CM | POA: Diagnosis not present

## 2023-08-23 DIAGNOSIS — I110 Hypertensive heart disease with heart failure: Secondary | ICD-10-CM | POA: Diagnosis not present

## 2023-08-23 DIAGNOSIS — R410 Disorientation, unspecified: Secondary | ICD-10-CM | POA: Diagnosis not present

## 2023-08-23 DIAGNOSIS — J449 Chronic obstructive pulmonary disease, unspecified: Secondary | ICD-10-CM | POA: Insufficient documentation

## 2023-08-23 DIAGNOSIS — Z8673 Personal history of transient ischemic attack (TIA), and cerebral infarction without residual deficits: Secondary | ICD-10-CM | POA: Diagnosis not present

## 2023-08-23 DIAGNOSIS — W19XXXA Unspecified fall, initial encounter: Secondary | ICD-10-CM | POA: Diagnosis not present

## 2023-08-23 DIAGNOSIS — E161 Other hypoglycemia: Secondary | ICD-10-CM | POA: Diagnosis not present

## 2023-08-23 LAB — CBG MONITORING, ED
Glucose-Capillary: 80 mg/dL (ref 70–99)
Glucose-Capillary: 135 mg/dL — ABNORMAL HIGH (ref 70–99)
Glucose-Capillary: 122 mg/dL — ABNORMAL HIGH (ref 70–99)

## 2023-08-23 MED ORDER — GLUCOSE 40 % PO GEL: 1.0000 | ORAL | Status: DC | PRN

## 2023-08-23 NOTE — Discharge Instructions (Signed)
 You were evaluated in the Emergency Department and after careful evaluation, we did not find any emergent condition requiring admission or further testing in the hospital.  Your exam/testing today is overall reassuring.  Symptoms likely due to low blood sugar.  Recommend slightly decreasing your insulin dosing for the next few days until you can follow-up with your primary care doctor as we discussed.  Please return to the Emergency Department if you experience any worsening of your condition.   Thank you for allowing [place] to be a part of your care.

## 2023-08-23 NOTE — ED Triage Notes (Signed)
 Pt bib EMS after they were called out for "diabetic issues". Pt reported that his blood glucose was in the 50's. EMS states that pt was in the floor upon their arrival (pt denies injury or LOC) and that they got a blood glucose of 58. Pt was given 30 of oral glucose by EMS and a repeat blood glucose reading was 120.

## 2023-08-23 NOTE — ED Provider Notes (Signed)
 [facility]-EMERGENCY DEPT [facility] Emergency Department Provider Note MRN:  [id number]  Arrival date & time: 08/23/23     Chief Complaint   Hypoglycemia   History of Present Illness   [name] is a 83 y.o. year-old male with a history of CAD, CHF, COPD, stroke, MI, diabetes presenting to the ED with chief complaint of hypoglycemia.  EMS called for hypoglycemia down into the 50s.  Patient was behaving as if he had low blood sugar per his significant other.  Was acting a bit confused and "hollering".  EMS found patient on the floor rival and gave some supplemental glucose with improvement.  Patient is now at his baseline, cheerful, in good humor, with no complaints.  Does not know how his blood sugar dropped low, says he took his normal dose of insulin.  Review of Systems  A thorough review of systems was obtained and all systems are negative except as noted in the HPI and PMH.   Patient's Health History    Past Medical History:  Diagnosis Date   Abnormality of gait 05/01/2015   Anemia 04/19/2015   Annual physical exam 12/11/2013   Arthritis    "left leg; knees" (05/07/2017)   Arthritis of knee    CAD (coronary artery disease)    CHF (congestive heart failure) (HCC)    Phreesia 08/08/2020   Choking 07/13/2015   Chronic bronchitis (HCC)    "get it q yr"   Chronic lower back pain    "since I was a teen" (05/07/2017)   COPD (chronic obstructive pulmonary disease) (HCC)    CVA (cerebral vascular accident) (HCC) 05/2015   R sided weakness (05/07/2017)   Diabetes mellitus without complication (HCC)    Phreesia 08/08/2020   GERD (gastroesophageal reflux disease)    Gout    History of colonic polyps 04/03/2017   Added automatically from request for surgery [id number]   Hyperlipidemia    Hypertension    Myocardial infarction (HCC) 1999   Obesity    OSA on CPAP    Oxygen deficiency    Phreesia 08/08/2020   Pneumonia    "a few times; last time was ~ 06/2013" (05/07/2017)    Psoriasis    Stroke (HCC)    Phreesia 08/08/2020   Thyroid disease    Phreesia 08/08/2020   Tussive syncope 05/01/2015   Type II diabetes mellitus (HCC)    Vitamin B12 deficiency 05/01/2015    Past Surgical History:  Procedure Laterality Date   APPENDECTOMY     BIOPSY  05/15/2023   Procedure: BIOPSY;  Surgeon: Franky Macho, MD;  Location: [facility] ENDO SUITE;  Service: Endoscopy;;   CATARACT EXTRACTION W/PHACO Left 10/07/2016   Procedure: CATARACT EXTRACTION PHACO AND INTRAOCULAR LENS PLACEMENT (IOC);  Surgeon: Jethro Bolus, MD;  Location: [facility] ORS;  Service: Ophthalmology;  Laterality: Left;  CDE: 13.49   CATARACT EXTRACTION W/PHACO Right 10/28/2016   Procedure: CATARACT EXTRACTION PHACO AND INTRAOCULAR LENS PLACEMENT (IOC);  Surgeon: Jethro Bolus, MD;  Location: [facility] ORS;  Service: Ophthalmology;  Laterality: Right;  CDE: 16.71   COLONOSCOPY  03/17/2012   Procedure: COLONOSCOPY;  Surgeon: Malissa Hippo, MD;  Location: [facility];  Service: Endoscopy;  Laterality: N/A;  830   COLONOSCOPY N/A 06/25/2017   Procedure: COLONOSCOPY;  Surgeon: Malissa Hippo, MD;  Location: [facility];  Service: Endoscopy;  Laterality: N/A;  930   COLONOSCOPY WITH PROPOFOL N/A 03/12/2023   Procedure: COLONOSCOPY WITH PROPOFOL;  Surgeon: Franky Macho, MD;  Location: [facility];  Service: Endoscopy;  Laterality: N/A;  9:30am;asa 3   CORONARY ANGIOPLASTY WITH STENT PLACEMENT  1999- 2009-08/30/2013   "counting today's, I have 5 stents" (08/30/2013)   CORONARY ANGIOPLASTY WITH STENT PLACEMENT  05/07/2017   CORONARY STENT INTERVENTION N/A 05/07/2017   Procedure: CORONARY STENT INTERVENTION;  Surgeon: Rinaldo Cloud, MD;  Location: [facility] INVAS[facility] CV LAB;  Service: Cardiovascular;  Laterality: N/A;   CYSTOSCOPY WITH URETHRAL DILATATION N/A 11/26/2012   Procedure: CYSTOSCOPY WITH URETHRAL DILATATION;  Surgeon: Anner Crete, MD;  Location: [facility] ORS;  Service: Urology;  Laterality: N/A;   ESOPHAGOGASTRODUODENOSCOPY  (EGD) WITH PROPOFOL N/A 05/15/2023   Procedure: ESOPHAGOGASTRODUODENOSCOPY (EGD) WITH PROPOFOL;  Surgeon: Franky Macho, MD;  Location: [facility];  Service: Endoscopy;  Laterality: N/A;  10:25am;asa 3   LEFT HEART CATH AND CORONARY ANGIOGRAPHY N/A 05/07/2017   Procedure: LEFT HEART CATH AND CORONARY ANGIOGRAPHY;  Surgeon: Rinaldo Cloud, MD;  Location: [facility];  Service: Cardiovascular;  Laterality: N/A;   LEFT HEART CATHETERIZATION WITH CORONARY ANGIOGRAM N/A 08/30/2013   Procedure: LEFT HEART CATHETERIZATION WITH CORONARY ANGIOGRAM;  Surgeon: Robynn Pane, MD;  Location: [facility] CATH LAB;  Service: Cardiovascular;  Laterality: N/A;   PERCUTANEOUS CORONARY STENT INTERVENTION (PCI-S) Right 08/30/2013   Procedure: PERCUTANEOUS CORONARY STENT INTERVENTION (PCI-S);  Surgeon: Robynn Pane, MD;  [facility] CATH LAB;  Service: Cardiovascular;  Laterality: Right;   POLYPECTOMY  03/12/2023   Procedure: POLYPECTOMY;  Surgeon: Franky Macho, MD;  Location[facility];  Service: Endoscopy;;    Family History  Problem Relation Age of Onset   Hypertension Mother    Diabetes Mother    Hypertension Father    Diabetes Brother    Stroke Daughter    Arthritis Other        Family History    Diabetes Other        family History     Social History   Socioeconomic History   Marital status: Married    Spouse name: Not on file   Number of children: 2   Years of education: 11   Highest education level: 12th grade  Occupational History   Occupation: Restaurant manager, fast food     Comment: retired   Tobacco Use   Smoking status: Former    Current packs/day: 0.00    Average packs/day: 0.5 packs/day for 33.0 years (16.5 ttl pk-yrs)    Types: Cigarettes    Start date: 11/28/1969    Quit date: 11/29/2002    Years since quitting: 20.7    Passive exposure: Past   Smokeless tobacco: Never  Vaping Use   Vaping status: Never Used  Substance and Sexual Activity   Alcohol use: No   Drug  use: No   Sexual activity: Not Currently  Other Topics Concern   Not on file  Social History Narrative   Patient drinks about 3 cups of soda daily.    Patient is left handed.    Lives alone with wife    Social Drivers of Health   Financial Resource Strain: Low Risk  (07/21/2022)   Overall Financial Resource Strain (CARDIA)    Difficulty of Paying Living Expenses: Not hard at all  Food Insecurity: Unknown (08/08/2019)   Hunger Vital Sign    Worried About Running Out of Food in the Last Year: Not on file    Ran Out of Food in the Last Year: Never true  Transportation Needs: No Transportation Needs (07/21/2022)  PRAPARE - Administrator, Civil Service (Medical): No    Lack of Transportation (Non-Medical): No  Physical Activity: Inactive (08/08/2020)   Exercise Vital Sign    Days of Exercise per Week: 0 days    Minutes of Exercise per Session: 0 min  Stress: No Stress Concern Present (08/08/2019)   [employer] of Occupational Health - Occupational Stress Questionnaire    Feeling of Stress : Not at all  Social Connections: Moderately Integrated (08/08/2020)   Social Connection and Isolation Panel [NHANES]    Frequency of Communication with Friends and Family: Three times a week    Frequency of Social Gatherings with Friends and Family: Three times a week    Attends Religious Services: 1 to 4 times per year    Active Member of Clubs or Organizations: No    Attends Banker Meetings: Never    Marital Status: Married  Catering manager Violence: Not At Risk (06/29/2017)   Humiliation, Afraid, Rape, and Kick questionnaire    Fear of Current or Ex-Partner: No    Emotionally Abused: No    Physically Abused: No    Sexually Abused: No     Physical Exam   Vitals:   08/23/23 0430 08/23/23 0500  BP: (!) 152/60 (!) 151/65  Pulse: (!) 53 (!) 57  Resp: 20 14  Temp:    SpO2: 91% 94%    CONSTITUTIONAL: Well-appearing, NAD NEURO/PSYCH:  Alert and oriented x 3,  normal and symmetric strength and sensation, normal coordination, normal speech EYES:  eyes equal and reactive ENT/NECK:  no LAD, no JVD CARDIO: Regular rate, well-perfused, normal S1 and S2 PULM:  CTAB no wheezing or rhonchi GI/GU:  non-distended, non-tender MSK/SPINE:  No gross deformities, no edema SKIN:  no rash, atraumatic   *Additional and/or pertinent findings included in MDM below  Diagnostic and Interventional Summary    EKG Interpretation Date/Time:  Sunday August 23 2023 03:20:10 EDT Ventricular Rate:  58 PR Interval:  247 QRS Duration:  92 QT Interval:  446 QTC Calculation: 439 R Axis:   -13  Text Interpretation: Sinus rhythm Prolonged PR interval Borderline low voltage, extremity leads Confirmed by Kennis Carina [phone]) on 08/23/2023 5:28:55 AM       Labs Reviewed  CBG MONITORING, ED - Abnormal; Notable for the following components:      Result Value   Glucose-Capillary 135 (*)    All other components within normal limits  CBG MONITORING, ED - Abnormal; Notable for the following components:   Glucose-Capillary 122 (*)    All other components within normal limits  CBG MONITORING, ED    No orders to display    Medications  dextrose (GLUTOSE) oral gel 40% (peds > 20kg and adults) (has no administration in time range)     Procedures  /  Critical Care Procedures  ED Course and Medical Decision Making  Initial Impression and Ddx Hypoglycemia, denies infectious symptoms, no fever, vitals are reassuring, no complaints at this time after supplemental glucose.  Will check glucose a few more times to ensure it does not drop again.  Past medical/surgical history that increases complexity of ED encounter: Diabetes  Interpretation of Diagnostics I personally reviewed the EKG and my interpretation is as follows: Sinus rhythm  Blood sugars checked multiple times reassuring  Patient Reassessment and Ultimate Disposition/Management     Patient having stable blood  sugars and is at his cognitive baseline with no symptoms for a few hours here in the emergency  department, no indication for further testing or admission appropriate for discharge.  Patient management required discussion with the following services or consulting groups:  None  Complexity of Problems Addressed Acute complicated illness or Injury  Additional Data Reviewed and Analyzed Further history obtained from: Further history from spouse/family member  Additional Factors Impacting ED Encounter Risk Consideration of hospitalization  Elmer Sow. Pilar Plate, MD [facility] Emergency Medicine [facility] [email or web address]  Final Clinical Impressions(s) / ED Diagnoses     ICD-10-CM   1. Hypoglycemia  E16.2       ED Discharge Orders     None        Discharge Instructions Discussed with and Provided to Patient:     Discharge Instructions      You were evaluated in the Emergency Department and after careful evaluation, we did not find any emergent condition requiring admission or further testing in the hospital.  Your exam/testing today is overall reassuring.  Symptoms likely due to low blood sugar.  Recommend slightly decreasing your insulin dosing for the next few days until you can follow-up with your primary care doctor as we discussed.  Please return to the Emergency Department if you experience any worsening of your condition.   Thank you for allowing [place] to be a part of your care.       Sabas Sous, MD 08/23/23 0530

## 2023-08-26 ENCOUNTER — Ambulatory Visit (INDEPENDENT_AMBULATORY_CARE_PROVIDER_SITE_OTHER): Payer: Medicare HMO

## 2023-08-26 VITALS — Ht 70.0 in | Wt 245.0 lb

## 2023-08-26 DIAGNOSIS — Z0000 Encounter for general adult medical examination without abnormal findings: Secondary | ICD-10-CM

## 2023-08-26 NOTE — Patient Instructions (Signed)
 Mr. [name] , Thank you for taking time to come for your Medicare Wellness Visit. I appreciate your ongoing commitment to your health goals. Please review the following plan we discussed and let me know if I can assist you in the future.   Please see your treatment plan below: Referrals:none placed Preventative Screenings:You are due for a diabetic foot exam. I will send Dr. Lodema Hong a message to let her know to do this at your next visit with her. Labs:up to date Follow-Up:Medicare Annual Wellness Visit August 30, 2024 at 8:00 am telephone visit Clinician Recommendations: Aim for 30 minutes of exercise or brisk walking, 6-8 glasses of water, and 5 servings of fruits and vegetables each day. Hope ya catch a big one :)  This is a list of the screening recommended for you and due dates:  Health Maintenance  Topic Date Due   Zoster (Shingles) Vaccine (1 of 2) Never done   COVID-19 Vaccine (4 - 2024-25 season) 01/18/2023   Complete foot exam   07/30/2023   Yearly kidney health urinalysis for diabetes  09/30/2023   Flu Shot  12/18/2023   Hemoglobin A1C  01/30/2024   Eye exam for diabetics  03/15/2024   Yearly kidney function blood test for diabetes  05/10/2024   Medicare Annual Wellness Visit  08/25/2024   DTaP/Tdap/Td vaccine (3 - Td or Tdap) 03/07/2027   Pneumonia Vaccine  Completed   HPV Vaccine  Aged Out   Hepatitis C Screening  Discontinued    Advanced directives: (Declined) Advance directive discussed with you today. Even though you declined this today, please call our office should you change your mind, and we can give you the proper paperwork for you to fill out.   Advance Care Planning is important because it:  [x]  Makes sure you receive the medical care that is consistent with your values, goals, and preferences  [x]  It provides guidance to your family and loved ones and it also reduces their decisional burden about whether or not they are making the right decisions based on  what you want done  Follow the link provided in your after visit summary or read over the paperwork we have mailed to you to help you started getting your Advance Directives in place. If you need assistance in completing these, please reach out to [place] so that we can help you!   Next Medicare Annual Wellness Visit scheduled for next year: yes  Understanding Your Risk for Falls Millions of people have serious injuries from falls each year. It is important to understand your risk of falling. Talk with your health care provider about your risk and what you can do to lower it. If you do have a serious fall, make sure to tell your provider. Falling once raises your risk of falling again. How can falls affect me? Serious injuries from falls are common. These include: Broken bones, such as hip fractures. Head injuries, such as traumatic brain injuries (TBI) or concussions. A fear of falling can cause you to avoid activities and stay at home. This can make your muscles weaker and raise your risk for a fall. What can increase my risk? There are a number of risk factors that increase your risk for falling. The more risk factors you have, the higher your risk of falling. Serious injuries from a fall happen most often to people who are older than 82 years old. Teenagers and young adults ages 41-29 are also at higher risk. Common risk factors include: Weakness in  the lower body. Being generally weak or confused due to long-term (chronic) illness. Dizziness or balance problems. Poor vision. Medicines that cause dizziness or drowsiness. These may include: Medicines for your blood pressure, heart, anxiety, insomnia, or swelling (edema). Pain medicines. Muscle relaxants. Other risk factors include: Drinking alcohol. Having had a fall in the past. Having foot pain or wearing improper footwear. Working at a dangerous job. Having any of the following in your home: Tripping hazards, such as floor clutter or  loose rugs. Poor lighting. Pets. Having dementia or memory loss. What actions can I take to lower my risk of falling?     Physical activity Stay physically fit. Do strength and balance exercises. Consider taking a regular class to build strength and balance. Yoga and tai chi are good options. Vision Have your eyes checked every year and your prescription for glasses or contacts updated as needed. Shoes and walking aids Wear non-skid shoes. Wear shoes that have rubber soles and low heels. Do not wear high heels. Do not walk around the house in socks or slippers. Use a cane or walker as told by your provider. Home safety Attach secure railings on both sides of your stairs. Install grab bars for your bathtub, shower, and toilet. Use a non-skid mat in your bathtub or shower. Attach bath mats securely with double-sided, non-slip rug tape. Use good lighting in all rooms. Keep a flashlight near your bed. Make sure there is a clear path from your bed to the bathroom. Use night-lights. Do not use throw rugs. Make sure all carpeting is taped or tacked down securely. Remove all clutter from walkways and stairways, including extension cords. Repair uneven or broken steps and floors. Avoid walking on icy or slippery surfaces. Walk on the grass instead of on icy or slick sidewalks. Use ice melter to get rid of ice on walkways in the winter. Use a cordless phone. Questions to ask your health care provider Can you help me check my risk for a fall? Do any of my medicines make me more likely to fall? Should I take a vitamin D supplement? What exercises can I do to improve my strength and balance? Should I make an appointment to have my vision checked? Do I need a bone density test to check for weak bones (osteoporosis)? Would it help to use a cane or a walker? Where to find more information Centers for Disease Control and Prevention, STEADI: TonerPromos.no Community-Based Fall Prevention Programs:  TonerPromos.no [employer] on Aging: BaseRingTones.pl Contact a health care provider if: You fall at home. You are afraid of falling at home. You feel weak, drowsy, or dizzy. This information is not intended to replace advice given to you by your health care provider. Make sure you discuss any questions you have with your health care provider. Document Revised: 01/06/2022 Document Reviewed: 01/06/2022 Elsevier Patient Education  2024 [employer].

## 2023-08-26 NOTE — Progress Notes (Signed)
 Because this visit was a virtual/telehealth visit,  certain criteria was not obtained, such a blood pressure, CBG if applicable, and timed get up and go. Any medications not marked as "taking" were not mentioned during the medication reconciliation part of the visit. Any vitals not documented were not able to be obtained due to this being a telehealth visit or patient was unable to self-report a recent blood pressure reading due to a lack of equipment at home via telehealth. Vitals that have been documented are verbally provided by the patient.   Subjective:   [name] is a 83 y.o. who presents for a Medicare Wellness preventive visit.  Visit Complete: Virtual I connected with  [name] on 08/26/23 by a audio enabled telemedicine application and verified that I am speaking with the correct person using two identifiers.  Patient Location: Home  Provider Location: Home Office  I discussed the limitations of evaluation and management by telemedicine. The patient expressed understanding and agreed to proceed.  Vital Signs: Because this visit was a virtual/telehealth visit, some criteria may be missing or patient reported. Any vitals not documented were not able to be obtained and vitals that have been documented are patient reported.  VideoDeclined- This patient declined Librarian, academic. Therefore the visit was completed with audio only.  Persons Participating in Visit: Patient.  AWV Questionnaire: No: Patient Medicare AWV questionnaire was not completed prior to this visit.  Cardiac Risk Factors include: advanced age (>63men, >9 women);diabetes mellitus;dyslipidemia;hypertension;male gender;obesity (BMI >30kg/m2);sedentary lifestyle;Other (see comment), Risk factor comments: CAD, CHF, COPD     Objective:    Today's Vitals   08/26/23 1011  Weight: 245 lb (111.1 kg)  Height: 5\' 10"  (1.778 m)  PainSc: 0-No pain   Body mass index is 35.15  kg/m.     08/26/2023   10:15 AM 05/15/2023    8:15 AM 05/11/2023    2:06 PM 03/12/2023    7:59 AM 03/10/2023    1:55 PM 11/15/2022    5:39 PM 08/25/2022    1:09 PM  Advanced Directives  Does Patient Have a Medical Advance Directive? No No No No No No No  Would patient like information on creating a medical advance directive? No - Patient declined No - Patient declined No - Patient declined No - Patient declined No - Patient declined No - Patient declined No - Patient declined    Current Medications (verified) Outpatient Encounter Medications as of 08/26/2023  Medication Sig   acetaminophen (TYLENOL) 500 MG tablet Take 1,000 mg by mouth every 6 (six) hours as needed for moderate pain (left thigh).    albuterol (VENTOLIN HFA) 108 (90 Base) MCG/ACT inhaler INHALE 1 TO 2 PUFFS EVERY 6 HOURS AS NEEDED FOR WHEEZING OR SHORTNESS OF BREATH (Patient taking differently: Inhale 2 puffs into the lungs every 6 (six) hours as needed for shortness of breath or wheezing.)   allopurinol (ZYLOPRIM) 300 MG tablet TAKE 1 TABLET EVERY DAY   aspirin EC 81 MG tablet Take 81 mg by mouth daily.   Blood Glucose Calibration (TRUE METRIX LEVEL 1) Low SOLN Use as directed   Blood Glucose Monitoring Suppl (TRUE METRIX METER) w/Device KIT USE AS DIRECTED   clobetasol ointment (TEMOVATE) 0.05 % Apply twice daily to affected areas for 14 days then once daily, as needed   clopidogrel (PLAVIX) 75 MG tablet TAKE 1 TABLET EVERY DAY   DROPLET INSULIN SYRINGE 31G X 5/16" 1 ML MISC USE AS DIRECTED IN THE  MORNING AND AT BEDTIME   ezetimibe (ZETIA) 10 MG tablet TAKE 1 TABLET EVERY DAY   glucose blood (TRUE METRIX BLOOD GLUCOSE TEST) test strip TEST BLOOD SUGAR FOUR TIMES DAILY   insulin NPH-regular Human (NOVOLIN 70/30) (70-30) 100 UNIT/ML injection Inject 35-45 Units into the skin 2 (two) times daily with a meal. Take 45 units with breakfast and 35 units with supper if glucose is above 90 and eating.   isosorbide mononitrate  (IMDUR) 30 MG 24 hr tablet TAKE 1 TABLET EVERY DAY   levETIRAcetam (KEPPRA) 500 MG tablet TAKE 1 TABLET TWICE DAILY   levothyroxine (SYNTHROID) 112 MCG tablet Take 1 tablet (112 mcg total) by mouth daily.   metoprolol tartrate (LOPRESSOR) 50 MG tablet TAKE 1/2 TABLET TWICE DAILY   nitroGLYCERIN (NITROSTAT) 0.4 MG SL tablet Place 1 tablet (0.4 mg total) under the tongue every 5 (five) minutes as needed for chest pain.   pantoprazole (PROTONIX) 40 MG tablet TAKE 1 TABLET EVERY DAY BEFORE BREAKFAST   polyethylene glycol (MIRALAX / GLYCOLAX) 17 g packet Take 17 g by mouth daily as needed.   pravastatin (PRAVACHOL) 40 MG tablet TAKE 2 TABLETS EVERY DAY   Probiotic Product (ALIGN) 4 MG CAPS Take 1 capsule (4 mg total) by mouth daily.   spironolactone (ALDACTONE) 50 MG tablet TAKE 1 TABLET EVERY DAY (DOSE INCREASE)   TRUEplus Lancets 33G MISC TEST BLOOD SUGAR FOUR TIMES DAILY   UNABLE TO FIND CPAP mask and tubing  DX G47.33   Vitamin D, Ergocalciferol, (DRISDOL) 1.25 MG (50000 UNIT) CAPS capsule Take 1 capsule (50,000 Units total) by mouth every 7 (seven) days.   No facility-administered encounter medications on file as of 08/26/2023.    Allergies (verified) Patient has no known allergies.   History: Past Medical History:  Diagnosis Date   Abnormality of gait 05/01/2015   Anemia 04/19/2015   Annual physical exam 12/11/2013   Arthritis    "left leg; knees" (05/07/2017)   Arthritis of knee    CAD (coronary artery disease)    CHF (congestive heart failure) (HCC)    Phreesia 08/08/2020   Choking 07/13/2015   Chronic bronchitis (HCC)    "get it q yr"   Chronic lower back pain    "since I was a teen" (05/07/2017)   COPD (chronic obstructive pulmonary disease) (HCC)    CVA (cerebral vascular accident) (HCC) 05/2015   R sided weakness (05/07/2017)   Diabetes mellitus without complication (HCC)    Phreesia 08/08/2020   GERD (gastroesophageal reflux disease)    Gout    History of colonic  polyps 04/03/2017   Added automatically from request for surgery [id number]   Hyperlipidemia    Hypertension    Myocardial infarction (HCC) 1999   Obesity    OSA on CPAP    Oxygen deficiency    Phreesia 08/08/2020   Pneumonia    "a few times; last time was ~ 06/2013" (05/07/2017)   Psoriasis    Stroke (HCC)    Phreesia 08/08/2020   Thyroid disease    Phreesia 08/08/2020   Tussive syncope 05/01/2015   Type II diabetes mellitus (HCC)    Vitamin B12 deficiency 05/01/2015   Past Surgical History:  Procedure Laterality Date   APPENDECTOMY     BIOPSY  05/15/2023   Procedure: BIOPSY;  Surgeon: Franky Macho, MD;  Location: [facility];  Service: Endoscopy;;   CATARACT EXTRACTION W/PHACO Left 10/07/2016   Procedure: CATARACT EXTRACTION PHACO AND INTRAOCULAR LENS PLACEMENT (IOC);  Surgeon: Jethro Bolus, MD;  Location: [facility];  Service: Ophthalmology;  Laterality: Left;  CDE: 13.49   CATARACT EXTRACTION W/PHACO Right 10/28/2016   Procedure: CATARACT EXTRACTION PHACO AND INTRAOCULAR LENS PLACEMENT (IOC);  Surgeon: Jethro Bolus, MD;  Location: [facility];  Service: Ophthalmology;  Laterality: Right;  CDE: 16.71   COLONOSCOPY  03/17/2012   Procedure: COLONOSCOPY;  Surgeon: Malissa Hippo, MD;  [facility];  Service: Endoscopy;  Laterality: N/A;  830   COLONOSCOPY N/A 06/25/2017   Procedure: COLONOSCOPY;  Surgeon: Malissa Hippo, MD;  [facility];  Service: Endoscopy;  Laterality: N/A;  930   COLONOSCOPY WITH PROPOFOL N/A 03/12/2023   Procedure: COLONOSCOPY WITH PROPOFOL;  Surgeon: Franky Macho, MD;  [facility]: [facility];  Service: Endoscopy;  Laterality: N/A;  9:30am;asa 3   CORONARY ANGIOPLASTY WITH STENT PLACEMENT  1999- 2009-08/30/2013   "counting today's, I have 5 stents" (08/30/2013)   CORONARY ANGIOPLASTY WITH STENT PLACEMENT  05/07/2017   CORONARY STENT INTERVENTION N/A 05/07/2017   Procedure: CORONARY STENT INTERVENTION;  Surgeon: Rinaldo Cloud, MD;   [facility];  Service: Cardiovascular;  Laterality: N/A;   CYSTOSCOPY WITH URETHRAL DILATATION N/A 11/26/2012   Procedure: CYSTOSCOPY WITH URETHRAL DILATATION;  Surgeon: Anner Crete, MD;  Location: [facility];  Service: Urology;  Laterality: N/A;   ESOPHAGOGASTRODUODENOSCOPY (EGD) WITH PROPOFOL N/A 05/15/2023   Procedure: ESOPHAGOGASTRODUODENOSCOPY (EGD) WITH PROPOFOL;  Surgeon: Franky Macho, MD;  [facility];  Service: Endoscopy;  Laterality: N/A;  10:25am;asa 3   LEFT HEART CATH AND CORONARY ANGIOGRAPHY N/A 05/07/2017   Procedure: LEFT HEART CATH AND CORONARY ANGIOGRAPHY;  Surgeon: Rinaldo Cloud, MD;  [facility]: [facility];  Service: Cardiovascular;  Laterality: N/A;   LEFT HEART CATHETERIZATION WITH CORONARY ANGIOGRAM N/A 08/30/2013   Procedure: LEFT HEART CATHETERIZATION WITH CORONARY ANGIOGRAM;  Surgeon: Robynn Pane, MD;  Location: [facility];  Service: Cardiovascular;  Laterality: N/A;   PERCUTANEOUS CORONARY STENT INTERVENTION (PCI-S) Right 08/30/2013   Procedure: PERCUTANEOUS CORONARY STENT INTERVENTION (PCI-S);  Surgeon: Robynn Pane, MD;  [facility];  Service: Cardiovascular;  Laterality: Right;   POLYPECTOMY  03/12/2023   Procedure: POLYPECTOMY;  Surgeon: Franky Macho, MD;  [facility];  Service: Endoscopy;;   Family History  Problem Relation Age of Onset   Hypertension Mother    Diabetes Mother    Hypertension Father    Diabetes Brother    Stroke Daughter    Arthritis Other        Family History    Diabetes Other        family History    Social History   Socioeconomic History   Marital status: Married    Spouse name: Not on file   Number of children: 2   Years of education: 11   Highest education level: 12th grade  Occupational History   Occupation: Restaurant manager, fast food     Comment: retired   Tobacco Use   Smoking status: Former    Current packs/day: 0.00    Average packs/day: 0.5 packs/day for  33.0 years (16.5 ttl pk-yrs)    Types: Cigarettes    Start date: 11/28/1969    Quit date: 11/29/2002    Years since quitting: 20.7    Passive exposure: Past   Smokeless tobacco: Never  Vaping Use   Vaping status: Never Used  Substance and Sexual Activity   Alcohol use: No   Drug use:  No   Sexual activity: Not Currently  Other Topics Concern   Not on file  Social History Narrative   Patient drinks about 3 cups of soda daily.    Patient is left handed.    Lives alone with wife    Social Drivers of Health   Financial Resource Strain: Low Risk  (08/26/2023)   Overall Financial Resource Strain (CARDIA)    Difficulty of Paying Living Expenses: Not hard at all  Food Insecurity: No Food Insecurity (08/26/2023)   Hunger Vital Sign    Worried About Running Out of Food in the Last Year: Never true    Ran Out of Food in the Last Year: Never true  Transportation Needs: No Transportation Needs (08/26/2023)   PRAPARE - Administrator, Civil Service (Medical): No    Lack of Transportation (Non-Medical): No  Physical Activity: Inactive (08/26/2023)   Exercise Vital Sign    Days of Exercise per Week: 0 days    Minutes of Exercise per Session: 0 min  Stress: No Stress Concern Present (08/26/2023)   [employer] of Occupational Health - Occupational Stress Questionnaire    Feeling of Stress : Not at all  Social Connections: Socially Isolated (08/26/2023)   Social Connection and Isolation Panel [NHANES]    Frequency of Communication with Friends and Family: Once a week    Frequency of Social Gatherings with Friends and Family: Never    Attends Religious Services: Never    Database administrator or Organizations: No    Attends Engineer, structural: Never    Marital Status: Married    Tobacco Counseling Counseling given: Yes    Clinical Intake:  Pre-visit preparation completed: Yes  Pain : No/denies pain Pain Score: 0-No pain     BMI - recorded: 35.15 Nutritional  Status: BMI > 30  Obese Nutritional Risks: None Diabetes: Yes CBG done?: No (telehealth visit. unable to obtain cbg) Did pt. bring in CBG monitor from home?: No  Lab Results  Component Value Date   HGBA1C 6.2 07/30/2023   HGBA1C 6.1 (A) 01/30/2023   HGBA1C 6.0 (A) 07/30/2022     How often do you need to have someone help you when you read instructions, pamphlets, or other written materials from your doctor or pharmacy?: 5 - Always (pt unable to focus on reading since he had a stroke.)  Interpreter Needed?: No  Information entered by :: Maryjean Ka CMA   Activities of Daily Living     08/26/2023   10:13 AM 05/11/2023    2:08 PM  In your present state of health, do you have any difficulty performing the following activities:  Hearing? 1   Comment wears hearing aids   Vision? 0   Difficulty concentrating or making decisions? 0   Walking or climbing stairs? 1   Comment uses a walker. patient states he does not climb stairs.   Dressing or bathing? 0   Doing errands, shopping? 0 0  Preparing Food and eating ? N   Using the Toilet? N   In the past six months, have you accidently leaked urine? N   Do you have problems with loss of bowel control? N   Managing your Medications? N   Managing your Finances? N   Housekeeping or managing your Housekeeping? N     Patient Care Team: Kerri Perches, MD as PCP - General Rinaldo Cloud, MD as Consulting Physician (Cardiology) Ernesto Rutherford, MD as Referring Physician (Ophthalmology) Ronny Bacon  J, NP as Nurse Practitioner (Endocrinology) Daisy Lazar, DO (Optometry) Ahmed, Juanetta Beets, MD as Consulting Physician (Gastroenterology) Marguerita Merles, Reuel Boom, MD as Consulting Physician (Gastroenterology)  Indicate any recent Medical Services you may have received from other than [facility] providers in the past year (date may be approximate).     Assessment:   This is a routine wellness examination for [name].  Hearing/Vision  screen Hearing Screening - Comments:: Patient wears hearing aids  Vision Screening - Comments:: Wears rx glasses - up to date with routine eye exams  Patient sees Dr. Ernesto Rutherford in [place]   Goals Addressed             This Visit's Progress    Patient Stated       I'd like to go fishing and improve my health       Depression Screen     08/26/2023   10:21 AM 06/09/2023    9:34 AM 02/04/2023    9:49 AM 11/14/2022    9:53 AM 09/30/2022   10:15 AM 08/25/2022    1:10 PM 07/29/2022    9:53 AM  PHQ 2/9 Scores  PHQ - 2 Score 0 0 0 0 0 0 0  PHQ- 9 Score 1   0 0      Fall Risk     08/26/2023   10:16 AM 06/09/2023    9:34 AM 02/04/2023    9:49 AM 11/14/2022    9:53 AM 09/30/2022   10:15 AM  Fall Risk   Falls in the past year? 1 0 1 0 1  Number falls in past yr: 1 0 1 0 1  Injury with Fall? 0 0 0 0 0  Risk for fall due to : No Fall Risks;Impaired balance/gait;Impaired mobility;History of fall(s) No Fall Risks;Impaired balance/gait History of fall(s);Impaired balance/gait;Impaired mobility No Fall Risks Impaired balance/gait;Impaired mobility  Risk for fall due to: Comment uses walker      Follow up Falls prevention discussed;Education provided Falls evaluation completed Falls evaluation completed Falls evaluation completed Falls evaluation completed    MEDICARE RISK AT HOME:  Medicare Risk at Home Any stairs in or around the home?: No If so, are there any without handrails?: No Home free of loose throw rugs in walkways, pet beds, electrical cords, etc?: Yes Adequate lighting in your home to reduce risk of falls?: Yes Life alert?: No Use of a cane, walker or w/c?: Yes Grab bars in the bathroom?: No Shower chair or bench in shower?: Yes Elevated toilet seat or a handicapped toilet?: Yes  TIMED UP AND GO:  Was the test performed?  No  Cognitive Function: 6CIT completed    05/01/2015    7:55 AM  MMSE - Mini Mental State Exam  Orientation to time 5  Orientation to Place 4   Registration 3  Attention/ Calculation 5  Recall 2  Language- name 2 objects 2  Language- repeat 1  Language- follow 3 step command 3  Language- read & follow direction 1  Write a sentence 1  Copy design 0  Total score 27        08/26/2023   10:18 AM 08/25/2022    1:10 PM 08/15/2021    1:12 PM 08/08/2020    9:15 AM 08/08/2019   11:18 AM  6CIT Screen  What Year? 0 points 0 points 0 points 0 points 0 points  What month? 0 points 0 points 0 points 0 points 0 points  What time? 0 points 0 points 0 points  0 points 0 points  Count back from 20 0 points 0 points 0 points 0 points 0 points  Months in reverse 0 points 2 points 0 points 0 points 0 points  Repeat phrase 4 points 4 points 0 points 0 points 0 points  Total Score 4 points 6 points 0 points 0 points 0 points    Immunizations Immunization History  Administered Date(s) Administered   Fluad Quad(high Dose 65+) 01/31/2019, 03/01/2020, 06/20/2021, 03/11/2022   Fluad Trivalent(High Dose 65+) 02/04/2023   Influenza Whole 03/18/2005   Influenza, High Dose Seasonal PF 02/22/2018   Influenza,inj,Quad PF,6+ Mos 03/19/2017   PFIZER(Purple Top)SARS-COV-2 Vaccination 06/25/2019, 07/16/2019, 04/22/2020   Pneumococcal Conjugate-13 12/08/2013   Pneumococcal Polysaccharide-23 06/09/2012   Td 12/04/2003   Tdap 03/06/2017    Screening Tests Health Maintenance  Topic Date Due   Zoster Vaccines- Shingrix (1 of 2) Never done   COVID-19 Vaccine (4 - 2024-25 season) 01/18/2023   FOOT EXAM  07/30/2023   Diabetic kidney evaluation - Urine ACR  09/30/2023   INFLUENZA VACCINE  12/18/2023   HEMOGLOBIN A1C  01/30/2024   OPHTHALMOLOGY EXAM  03/15/2024   Diabetic kidney evaluation - eGFR measurement  05/10/2024   Medicare Annual Wellness (AWV)  08/25/2024   DTaP/Tdap/Td (3 - Td or Tdap) 03/07/2027   Pneumonia Vaccine 34+ Years old  Completed   HPV VACCINES  Aged Out   Hepatitis C Screening  Discontinued    Health Maintenance  Health  Maintenance Due  Topic Date Due   Zoster Vaccines- Shingrix (1 of 2) Never done   COVID-19 Vaccine (4 - 2024-25 season) 01/18/2023   FOOT EXAM  07/30/2023   Health Maintenance Items Addressed: Advised patient of recommended vaccines. Message sent to provider to let her know patient is due for a diabetic foot exam to close health maintenance gap  Additional Screening:  Vision Screening: Recommended annual ophthalmology exams for early detection of glaucoma and other disorders of the eye. Patient is up to date on yearly eye exams Dental Screening: Recommended annual dental exams for proper oral hygiene  Community Resource Referral / Chronic Care Management: CRR required this visit?  No   CCM required this visit?  No     Plan:     I have personally reviewed and noted the following in the patient's chart:   Medical and social history Use of alcohol, tobacco or illicit drugs  Current medications and supplements including opioid prescriptions. Patient is not currently taking opioid prescriptions. Functional ability and status Nutritional status Physical activity Advanced directives List of other physicians Hospitalizations, surgeries, and ER visits in previous 12 months Vitals Screenings to include cognitive, depression, and falls Referrals and appointments  In addition, I have reviewed and discussed with patient certain preventive protocols, quality metrics, and best practice recommendations. A written personalized care plan for preventive services as well as general preventive health recommendations were provided to patient.     Jordan Hawks Chevette Fee, CMA   08/26/2023   After Visit Summary: (Mail) Due to this being a telephonic visit, the after visit summary with patients personalized plan was offered to patient via mail   Notes: Please refer to Routing Comments.

## 2023-09-01 ENCOUNTER — Encounter: Payer: Self-pay | Admitting: Family Medicine

## 2023-09-01 ENCOUNTER — Ambulatory Visit: Payer: Medicare HMO | Admitting: Primary Care

## 2023-09-01 ENCOUNTER — Telehealth: Payer: Self-pay | Admitting: Primary Care

## 2023-09-01 NOTE — Telephone Encounter (Signed)
 LVM for patient to call and reschedule the 09/01/23 10:30 am appointment with Eulas Hick, NP (out sick)

## 2023-10-05 ENCOUNTER — Other Ambulatory Visit: Payer: Self-pay | Admitting: Family Medicine

## 2023-11-09 ENCOUNTER — Other Ambulatory Visit: Payer: Self-pay | Admitting: Family Medicine

## 2023-11-11 ENCOUNTER — Encounter: Payer: Self-pay | Admitting: Family Medicine

## 2023-11-11 ENCOUNTER — Ambulatory Visit (INDEPENDENT_AMBULATORY_CARE_PROVIDER_SITE_OTHER): Payer: Medicare HMO | Admitting: Family Medicine

## 2023-11-11 VITALS — BP 144/82 | HR 92 | Resp 18 | Ht 70.0 in

## 2023-11-11 DIAGNOSIS — I10 Essential (primary) hypertension: Secondary | ICD-10-CM | POA: Diagnosis not present

## 2023-11-11 DIAGNOSIS — Z0001 Encounter for general adult medical examination with abnormal findings: Secondary | ICD-10-CM | POA: Diagnosis not present

## 2023-11-11 DIAGNOSIS — M109 Gout, unspecified: Secondary | ICD-10-CM | POA: Diagnosis not present

## 2023-11-11 DIAGNOSIS — Z794 Long term (current) use of insulin: Secondary | ICD-10-CM

## 2023-11-11 DIAGNOSIS — E559 Vitamin D deficiency, unspecified: Secondary | ICD-10-CM | POA: Diagnosis not present

## 2023-11-11 DIAGNOSIS — Z125 Encounter for screening for malignant neoplasm of prostate: Secondary | ICD-10-CM | POA: Diagnosis not present

## 2023-11-11 DIAGNOSIS — E118 Type 2 diabetes mellitus with unspecified complications: Secondary | ICD-10-CM | POA: Diagnosis not present

## 2023-11-11 DIAGNOSIS — E785 Hyperlipidemia, unspecified: Secondary | ICD-10-CM | POA: Diagnosis not present

## 2023-11-11 DIAGNOSIS — E038 Other specified hypothyroidism: Secondary | ICD-10-CM | POA: Diagnosis not present

## 2023-11-11 MED ORDER — AMLODIPINE BESYLATE 2.5 MG PO TABS: 2.5000 mg | ORAL_TABLET | Freq: Every day | ORAL | 3 refills | Status: AC

## 2023-11-11 NOTE — Assessment & Plan Note (Signed)
 Uncontrolled , add amlodipine  2.5 mg daily

## 2023-11-11 NOTE — Assessment & Plan Note (Signed)

## 2023-11-11 NOTE — Progress Notes (Signed)
   [name]     MRN: [id number]      DOB: 08/17/1940  Chief Complaint  Patient presents with   Dysmenorrhea    Complains of having cramping in both legs for a couple weeks    Annual Exam    HPI: Patient is in for annual physical exam. No other health concerns are expressed or addressed at the visit. Recent labs, if available are reviewed. Immunization is reviewed , and  updated if needed.    PE; BP (!) 144/82   Pulse 92   Resp 18   Ht 5' 10 (1.778 m)   SpO2 (!) 88%   BMI 35.15 kg/m   Pleasant male, alert and oriented x 3, in no cardio-pulmonary distress. Afebrile. HEENT No facial trauma or asymetry. Sinuses non tender. EOMI External ears normal,  Neck: supple, no adenopathy,JVD or thyromegaly.No bruits.  Chest: Clear to ascultation bilaterally.No crackles or wheezes. Non tender to palpation  Cardiovascular system; Heart sounds normal,  S1 and  S2 ,no S3.  No murmur, or thrill. Apical beat not displaced Peripheral pulses normal.  Abdomen: Soft, non tender,   Musculoskeletal exam: Decreased ROM of spine, hips , shoulders and knees. No deformity ,swelling or crepitus noted.   Neurologic: Cranial nerves 2 to 12 intact. Grade 4 power in Right upper and lower extremities, CN 2 to 12 intact Skin: Intact, no ulceration, noted. Hyperpigmented annular rash noted on lower extremities   Psych; Normal mood and affect. Judgement  normal   Assessment & Plan:  Encounter for Medicare annual examination with abnormal findings Annual exam as documented. Counseling done  re healthy lifestyle involving commitment to 150 minutes exercise per week, heart healthy diet, and attaining healthy weight.The importance of adequate sleep also discussed. Regular seat belt use and home safety, is also discussed. Changes in health habits are decided on by the patient with goals and time frames  set for achieving them. Immunization and cancer screening needs are specifically  addressed at this visit.   Essential hypertension Uncontrolled , add amlodipine  2.5 mg daily

## 2023-11-11 NOTE — Patient Instructions (Addendum)
 F/U in early September, call if you need me sooner   New additional  MED FOR BP IS AMLODIPINE  2.5 MG DAILY  YOU NEED TO GO TO PODIATRY FOR FOOT CARE  Urine ACR today CBC, lipid, cmp and EGFr, PSA, urine ACR , TSH and free T4, uric acid  and vit D  Thanks for choosing [facility], we consider it a privelige to serve you.

## 2023-11-12 ENCOUNTER — Ambulatory Visit: Payer: Self-pay | Admitting: Family Medicine

## 2023-11-12 LAB — MICROALBUMIN / CREATININE URINE RATIO

## 2023-11-13 LAB — CBC WITH DIFFERENTIAL/PLATELET
Basophils Absolute: 0.1 10*3/uL (ref 0.0–0.2)
Basos: 1 %
EOS (ABSOLUTE): 0.2 10*3/uL (ref 0.0–0.4)
Eos: 2 %
Hematocrit: 38.1 % (ref 37.5–51.0)
Hemoglobin: 12.8 g/dL — ABNORMAL LOW (ref 13.0–17.7)
Immature Grans (Abs): 0 10*3/uL (ref 0.0–0.1)
Immature Granulocytes: 0 %
Lymphocytes Absolute: 3 10*3/uL (ref 0.7–3.1)
Lymphs: 26 %
MCH: 29.4 pg (ref 26.6–33.0)
MCHC: 33.6 g/dL (ref 31.5–35.7)
MCV: 88 fL (ref 79–97)
Monocytes Absolute: 0.7 10*3/uL (ref 0.1–0.9)
Monocytes: 6 %
Neutrophils Absolute: 7.6 10*3/uL — ABNORMAL HIGH (ref 1.4–7.0)
Neutrophils: 65 %
Platelets: 258 10*3/uL (ref 150–450)
RBC: 4.35 x10E6/uL (ref 4.14–5.80)
RDW: 13.5 % (ref 11.6–15.4)
WBC: 11.5 10*3/uL — ABNORMAL HIGH (ref 3.4–10.8)

## 2023-11-13 LAB — CMP14+EGFR
ALT: 6 IU/L (ref 0–44)
AST: 13 IU/L (ref 0–40)
Albumin: 4.1 g/dL (ref 3.7–4.7)
Alkaline Phosphatase: 87 IU/L (ref 44–121)
BUN/Creatinine Ratio: 11 (ref 10–24)
BUN: 14 mg/dL (ref 8–27)
Bilirubin Total: 0.3 mg/dL (ref 0.0–1.2)
CO2: 21 mmol/L (ref 20–29)
Calcium: 9.5 mg/dL (ref 8.6–10.2)
Chloride: 100 mmol/L (ref 96–106)
Creatinine, Ser: 1.29 mg/dL — ABNORMAL HIGH (ref 0.76–1.27)
Globulin, Total: 3.3 g/dL (ref 1.5–4.5)
Glucose: 66 mg/dL — ABNORMAL LOW (ref 70–99)
Potassium: 4.1 mmol/L (ref 3.5–5.2)
Sodium: 144 mmol/L (ref 134–144)
Total Protein: 7.4 g/dL (ref 6.0–8.5)
eGFR: 56 mL/min/{1.73_m2} — ABNORMAL LOW (ref >59)

## 2023-11-13 LAB — LIPID PANEL
Chol/HDL Ratio: 3.6 ratio (ref 0.0–5.0)
Cholesterol, Total: 115 mg/dL (ref 100–199)
HDL: 32 mg/dL — ABNORMAL LOW (ref >39)
LDL Chol Calc (NIH): 61 mg/dL (ref 0–99)
Triglycerides: 121 mg/dL (ref 0–149)
VLDL Cholesterol Cal: 22 mg/dL (ref 5–40)

## 2023-11-13 LAB — MICROALBUMIN / CREATININE URINE RATIO: Microalbumin, Urine: 39.9 ug/mL

## 2023-11-13 LAB — VITAMIN D 25 HYDROXY (VIT D DEFICIENCY, FRACTURES): Vit D, 25-Hydroxy: 37.5 ng/mL (ref 30.0–100.0)

## 2023-11-13 LAB — URIC ACID: Uric Acid: 4.2 mg/dL (ref 3.8–8.4)

## 2023-11-13 LAB — PSA: Prostate Specific Ag, Serum: 0.9 ng/mL (ref 0.0–4.0)

## 2023-11-13 LAB — TSH+FREE T4
Free T4: 1.1 ng/dL (ref 0.82–1.77)
TSH: 1.88 u[IU]/mL (ref 0.450–4.500)

## 2023-11-24 ENCOUNTER — Ambulatory Visit (INDEPENDENT_AMBULATORY_CARE_PROVIDER_SITE_OTHER): Admitting: Adult Health

## 2023-11-24 ENCOUNTER — Encounter: Payer: Self-pay | Admitting: Adult Health

## 2023-11-24 ENCOUNTER — Telehealth: Payer: Self-pay

## 2023-11-24 VITALS — BP 171/75 | HR 75 | Ht 70.0 in | Wt 252.0 lb

## 2023-11-24 DIAGNOSIS — Z87891 Personal history of nicotine dependence: Secondary | ICD-10-CM | POA: Diagnosis not present

## 2023-11-24 DIAGNOSIS — G4733 Obstructive sleep apnea (adult) (pediatric): Secondary | ICD-10-CM

## 2023-11-24 NOTE — Progress Notes (Signed)
 @Patient  ID: [name], male    DOB: September 13, 1940, 83 y.o.   MRN: [id number]  Chief Complaint  Patient presents with   Establish Care    Referring provider: Antonetta Rollene BRAVO, MD  HPI: 83 year old male seen for sleep consult November 24, 2023 to establish for sleep apnea.   TEST/EVENTS :   11/24/2023 Sleep consult  Discussed the use of AI scribe software for clinical note transcription with the patient, who gave verbal consent to proceed.  History of Present Illness   [name] is an 83 year old male with sleep apnea who presents for management for OSA  He has been using a CPAP machine since at least 2017, following a stroke in the same year. He wears the CPAP every night with a full face mask, achieving 8 to 9 hours of sleep per night, and takes naps during the day. He last received CPAP supplies in 2024 from [facility], including masks, hoses, and filters. Sleep study 08/27/2015 showed severe OSA.  In addition to sleep apnea, he has a history of a stroke in January 2017, resulting in right side weakness. He also has high blood pressure, a history of heart attack, and COPD, for which he uses an albuterol  inhaler as a rescue medication. He has a history of smoking but has since quit. He is on insulin  for diabetes and has high cholesterol. Feels he benefits from CPAP with decreased daytime sleepiness.   His family history includes diabetes and heart problems, as his father was a diabetic with heart issues. He is retired from working at a [employer] and has adult children. He does not consume alcohol or use recreational drugs.Married lives with Wife [name].       No Known Allergies  Immunization History  Administered Date(s) Administered   Fluad Quad(high Dose 65+) 01/31/2019, 03/01/2020, 06/20/2021, 03/11/2022   Fluad Trivalent(High Dose 65+) 02/04/2023   Influenza Whole 03/18/2005   Influenza, High Dose Seasonal PF 02/22/2018   Influenza,inj,Quad PF,6+ Mos 03/19/2017    PFIZER(Purple Top)SARS-COV-2 Vaccination 06/25/2019, 07/16/2019, 04/22/2020   Pneumococcal Conjugate-13 12/08/2013   Pneumococcal Polysaccharide-23 06/09/2012   Td 12/04/2003   Tdap 03/06/2017    Past Medical History:  Diagnosis Date   Abnormality of gait 05/01/2015   Anemia 04/19/2015   Annual physical exam 12/11/2013   Arthritis    left leg; knees (05/07/2017)   Arthritis of knee    CAD (coronary artery disease)    CHF (congestive heart failure) (HCC)    Phreesia 08/08/2020   Choking 07/13/2015   Chronic bronchitis (HCC)    get it q yr   Chronic lower back pain    since I was a teen (05/07/2017)   COPD (chronic obstructive pulmonary disease) (HCC)    CVA (cerebral vascular accident) (HCC) 05/2015   R sided weakness (05/07/2017)   Diabetes mellitus without complication (HCC)    Phreesia 08/08/2020   GERD (gastroesophageal reflux disease)    Gout    History of colonic polyps 04/03/2017   Added automatically from request for surgery [id number]   Hyperlipidemia    Hypertension    Myocardial infarction (HCC) 1999   Obesity    OSA on CPAP    Oxygen  deficiency    Phreesia 08/08/2020   Pneumonia    a few times; last time was ~ 06/2013 (05/07/2017)   Psoriasis    Stroke (HCC)    Phreesia 08/08/2020   Thyroid  disease    Phreesia 08/08/2020   Tussive syncope 05/01/2015  Type II diabetes mellitus (HCC)    Vitamin B12 deficiency 05/01/2015    Tobacco History: Social History   Tobacco Use  Smoking Status Former   Current packs/day: 0.00   Average packs/day: 0.5 packs/day for 33.0 years (16.5 ttl pk-yrs)   Types: Cigarettes   Start date: 11/28/1969   Quit date: 11/29/2002   Years since quitting: 21.0   Passive exposure: Past  Smokeless Tobacco Never   Counseling given: Not Answered   Outpatient Medications Prior to Visit  Medication Sig Dispense Refill   acetaminophen  (TYLENOL ) 500 MG tablet Take 1,000 mg by mouth every 6 (six) hours as needed for moderate pain  (left thigh).      albuterol  (VENTOLIN  HFA) 108 (90 Base) MCG/ACT inhaler INHALE 1 TO 2 PUFFS EVERY 6 HOURS AS NEEDED FOR WHEEZING OR SHORTNESS OF BREATH (Patient taking differently: Inhale 2 puffs into the lungs every 6 (six) hours as needed for shortness of breath or wheezing.) 1 each 10   allopurinol  (ZYLOPRIM ) 300 MG tablet TAKE 1 TABLET EVERY DAY 90 tablet 3   amLODipine  (NORVASC ) 2.5 MG tablet Take 1 tablet (2.5 mg total) by mouth daily. 30 tablet 3   aspirin  EC 81 MG tablet Take 81 mg by mouth daily.     Blood Glucose Calibration (TRUE METRIX LEVEL 1) Low SOLN Use as directed 1 each 0   Blood Glucose Monitoring Suppl (TRUE METRIX METER) w/Device KIT USE AS DIRECTED 1 kit 0   clobetasol  ointment (TEMOVATE ) 0.05 % Apply twice daily to affected areas for 14 days then once daily, as needed 69 g 1   clopidogrel  (PLAVIX ) 75 MG tablet TAKE 1 TABLET EVERY DAY 90 tablet 3   DROPLET INSULIN  SYRINGE 31G X 5/16 1 ML MISC USE AS DIRECTED IN THE MORNING AND AT BEDTIME 100 each 0   ezetimibe  (ZETIA ) 10 MG tablet TAKE 1 TABLET EVERY DAY 90 tablet 3   glucose blood (TRUE METRIX BLOOD GLUCOSE TEST) test strip TEST BLOOD SUGAR FOUR TIMES DAILY 400 strip 3   insulin  NPH-regular Human (NOVOLIN  70/30) (70-30) 100 UNIT/ML injection Inject 35-45 Units into the skin 2 (two) times daily with a meal. Take 45 units with breakfast and 35 units with supper if glucose is above 90 and eating. 90 mL 3   isosorbide  mononitrate (IMDUR ) 30 MG 24 hr tablet TAKE 1 TABLET EVERY DAY 90 tablet 3   levETIRAcetam  (KEPPRA ) 500 MG tablet TAKE 1 TABLET TWICE DAILY 180 tablet 3   levothyroxine  (SYNTHROID ) 112 MCG tablet Take 1 tablet (112 mcg total) by mouth daily. 90 tablet 3   metoprolol  tartrate (LOPRESSOR ) 50 MG tablet TAKE 1/2 TABLET TWICE DAILY 90 tablet 3   nitroGLYCERIN  (NITROSTAT ) 0.4 MG SL tablet Place 1 tablet (0.4 mg total) under the tongue every 5 (five) minutes as needed for chest pain. 100 tablet 3   pantoprazole   (PROTONIX ) 40 MG tablet TAKE 1 TABLET EVERY DAY BEFORE BREAKFAST 90 tablet 3   polyethylene glycol (MIRALAX  / GLYCOLAX ) 17 g packet Take 17 g by mouth daily as needed. 14 each 0   pravastatin  (PRAVACHOL ) 40 MG tablet TAKE 2 TABLETS EVERY DAY 180 tablet 3   Probiotic Product (ALIGN) 4 MG CAPS Take 1 capsule (4 mg total) by mouth daily. 30 capsule 0   spironolactone  (ALDACTONE ) 50 MG tablet TAKE 1 TABLET EVERY DAY 90 tablet 3   TRUEplus Lancets 33G MISC TEST BLOOD SUGAR FOUR TIMES DAILY 400 each 0   UNABLE TO FIND CPAP mask  and tubing  DX G47.33 1 each 0   Vitamin D , Ergocalciferol , (DRISDOL ) 1.25 MG (50000 UNIT) CAPS capsule Take 1 capsule (50,000 Units total) by mouth every 7 (seven) days. 12 capsule 2   No facility-administered medications prior to visit.     Review of Systems:   Constitutional:   No  weight loss, night sweats,  Fevers, chills, +fatigue, or  lassitude.  HEENT:   No headaches,  Difficulty swallowing,  Tooth/dental problems, or  Sore throat,                No sneezing, itching, ear ache, nasal congestion, post nasal drip,   CV:  No chest pain,  Orthopnea, PND, swelling in lower extremities, anasarca, dizziness, palpitations, syncope.   GI  No heartburn, indigestion, abdominal pain, nausea, vomiting, diarrhea, change in bowel habits, loss of appetite, bloody stools.   Resp: No shortness of breath with exertion or at rest.  No excess mucus, no productive cough,  No non-productive cough,  No coughing up of blood.  No change in color of mucus.  No wheezing.  No chest wall deformity  Skin: no rash or lesions.  GU: no dysuria, change in color of urine, no urgency or frequency.  No flank pain, no hematuria   MS:  No joint pain or swelling.  No decreased range of motion.  No back pain.    Physical Exam  BP (!) 171/75   Pulse 75   Ht 5' 10 (1.778 m)   Wt 252 lb (114.3 kg)   SpO2 94% Comment: RA  BMI 36.16 kg/m   GEN: A/Ox3; pleasant , NAD, elderly, standing walker     HEENT:  [place]/AT,   NOSE-clear, THROAT-clear, no lesions, no postnasal drip or exudate noted. Class 3 MP airway   NECK:  Supple w/ fair ROM; no JVD; normal carotid impulses w/o bruits; no thyromegaly or nodules palpated; no lymphadenopathy.    RESP  Clear  P & A; w/o, wheezes/ rales/ or rhonchi. no accessory muscle use, no dullness to percussion  CARD:  RRR, no m/r/g, no peripheral edema, pulses intact, no cyanosis or clubbing.  GI:   Soft & nt; nml bowel sounds; no organomegaly or masses detected.   Musco: Warm bil, no deformities or joint swelling noted.   Neuro: alert, no focal deficits noted.    Skin: Warm, no lesions or rashes    Lab Results:  CBC   BMET     Imaging: No results found.  Administration History     None           No data to display          No results found for: NITRICOXIDE      Assessment & Plan:   No problem-specific Assessment & Plan notes found for this encounter.  Assessment and Plan    Obstructive Sleep Apnea   Severe obstructive sleep apnea was diagnosed in 2017 (AHI 31/hr) on CPAP with perceived benefit.  He currently uses an 48-year-old CPAP machine with a full face mask for 8-9 hours nightly. Needs new CPAP and supplies. He lacks a current connection with a home care company for CPAP supplies.  Coordinate with a local home care company for CPAP machine download and order a new CPAP machine. Ensure regular CPAP maintenance: use distilled water , clean the mask daily, clean the hose weekly with soapy water , and change filters   Hx of stroke with residual right sided weakness.  A stroke in January 2017  resulted in right-sided weakness. He uses a walker for mobility support.  Hypertension , DM- continue follow up with PCP         Madelin Stank, NP 11/24/2023

## 2023-11-24 NOTE — Patient Instructions (Signed)
 CPAP download- contact local DME .  Order for new CPAP machine  Wear CPAP At bedtime  all night long and with naps Work on healthy weight loss  Do not drive if sleepy  Follow up in 3 months and As needed

## 2023-11-24 NOTE — Telephone Encounter (Signed)
 ATC regarding his CPAP and him going to [facility] to get a download off his machine. Phone number is not accepting calls.

## 2023-11-25 ENCOUNTER — Telehealth: Payer: Self-pay

## 2023-11-25 NOTE — Telephone Encounter (Signed)
 AtC x2 regarding his CPAP, We need a download from machine. Pt does not know the DME and [facility] couple possible pull a download if pt bring cpap to [facility].

## 2023-12-04 ENCOUNTER — Ambulatory Visit: Admitting: Podiatry

## 2023-12-11 ENCOUNTER — Ambulatory Visit: Admitting: Podiatry

## 2023-12-11 ENCOUNTER — Encounter: Payer: Self-pay | Admitting: Podiatry

## 2023-12-11 DIAGNOSIS — B351 Tinea unguium: Secondary | ICD-10-CM | POA: Diagnosis not present

## 2023-12-11 DIAGNOSIS — E1149 Type 2 diabetes mellitus with other diabetic neurological complication: Secondary | ICD-10-CM | POA: Diagnosis not present

## 2023-12-11 DIAGNOSIS — M79674 Pain in right toe(s): Secondary | ICD-10-CM

## 2023-12-11 DIAGNOSIS — L84 Corns and callosities: Secondary | ICD-10-CM

## 2023-12-11 DIAGNOSIS — E1140 Type 2 diabetes mellitus with diabetic neuropathy, unspecified: Secondary | ICD-10-CM | POA: Diagnosis not present

## 2023-12-11 DIAGNOSIS — M79675 Pain in left toe(s): Secondary | ICD-10-CM | POA: Diagnosis not present

## 2023-12-13 NOTE — Progress Notes (Signed)
 Subjective:   Patient ID: [name], male   DOB: 83 y.o.   MRN: [id number]   HPI Patient presents with caregiver with chronic lesions underneath the fifth metatarsal of both feet that get tender nail disease 1-5 both feet that have not been taken care of for a long time and diabetes that is kept under reasonable control.  The patient does not smoke likes to be active   Review of Systems  All other systems reviewed and are negative.       Objective:  Physical Exam Vitals and nursing note reviewed.  Constitutional:      Appearance: He is well-developed.  Pulmonary:     Effort: Pulmonary effort is normal.  Musculoskeletal:        General: Normal range of motion.  Skin:    General: Skin is warm.  Neurological:     Mental Status: He is alert.     Vascular status found to be intact mildly weakened pulses with diminishment of sharp dull vibratory bilateral with the patient noted to have significant keratotic lesions fifth metatarsal head both feet with elongated nailbeds 1-5 both feet painful when pressed.  Patient is moderately obese which is complicating factor     Assessment:  Patient with long-term diabetes under reasonable control with moderate neurological symptoms with elongated nailbeds 1-5 both feet thick yellow brittle and keratotic lesion subfifth metatarsal head painful when pressed     Plan:  H&P reviewed both conditions and discussed the importance of diabetic control and the importance of daily inspections of her feet.  I debrided the lesions bilateral no iatrogenic bleeding and I debrided nailbeds 1-5 both feet no iatrogenic bleeding.  Reappoint routine care

## 2024-01-02 ENCOUNTER — Other Ambulatory Visit: Payer: Self-pay | Admitting: Family Medicine

## 2024-01-13 ENCOUNTER — Other Ambulatory Visit: Payer: Self-pay | Admitting: Family Medicine

## 2024-01-22 ENCOUNTER — Other Ambulatory Visit: Payer: Self-pay | Admitting: Nurse Practitioner

## 2024-01-26 DIAGNOSIS — E039 Hypothyroidism, unspecified: Secondary | ICD-10-CM | POA: Diagnosis not present

## 2024-01-27 LAB — T4, FREE: Free T4: 1.17 ng/dL (ref 0.82–1.77)

## 2024-01-27 LAB — TSH: TSH: 3.1 u[IU]/mL (ref 0.450–4.500)

## 2024-01-28 ENCOUNTER — Ambulatory Visit: Admitting: Family Medicine

## 2024-02-02 ENCOUNTER — Ambulatory Visit: Admitting: Nurse Practitioner

## 2024-02-02 ENCOUNTER — Encounter: Payer: Self-pay | Admitting: Nurse Practitioner

## 2024-02-02 VITALS — BP 136/80 | HR 68 | Ht 70.0 in | Wt 251.8 lb

## 2024-02-02 DIAGNOSIS — I10 Essential (primary) hypertension: Secondary | ICD-10-CM | POA: Diagnosis not present

## 2024-02-02 DIAGNOSIS — E039 Hypothyroidism, unspecified: Secondary | ICD-10-CM

## 2024-02-02 DIAGNOSIS — E038 Other specified hypothyroidism: Secondary | ICD-10-CM

## 2024-02-02 DIAGNOSIS — Z794 Long term (current) use of insulin: Secondary | ICD-10-CM | POA: Diagnosis not present

## 2024-02-02 DIAGNOSIS — E559 Vitamin D deficiency, unspecified: Secondary | ICD-10-CM

## 2024-02-02 DIAGNOSIS — E782 Mixed hyperlipidemia: Secondary | ICD-10-CM | POA: Diagnosis not present

## 2024-02-02 DIAGNOSIS — E1165 Type 2 diabetes mellitus with hyperglycemia: Secondary | ICD-10-CM

## 2024-02-02 LAB — POCT GLYCOSYLATED HEMOGLOBIN (HGB A1C): Hemoglobin A1C: 6.3 % — AB (ref 4.0–5.6)

## 2024-02-02 MED ORDER — LEVOTHYROXINE SODIUM 112 MCG PO TABS: 112.0000 ug | ORAL_TABLET | Freq: Every day | ORAL | 3 refills | Status: AC

## 2024-02-02 MED ORDER — VITAMIN D (ERGOCALCIFEROL) 1.25 MG (50000 UNIT) PO CAPS: 50000.0000 [IU] | ORAL_CAPSULE | ORAL | 2 refills | Status: AC

## 2024-02-02 NOTE — Progress Notes (Signed)
 02/02/2024  Endocrinology follow-up note  Subjective:    Patient ID: [name], male    DOB: [birth year], PCP Antonetta Rollene BRAVO, MD   Past Medical History:  Diagnosis Date   Abnormality of gait 05/01/2015   Anemia 04/19/2015   Annual physical exam 12/11/2013   Arthritis    left leg; knees (05/07/2017)   Arthritis of knee    CAD (coronary artery disease)    CHF (congestive heart failure) (HCC)    Phreesia 08/08/2020   Choking 07/13/2015   Chronic bronchitis (HCC)    get it q yr   Chronic lower back pain    since I was a teen (05/07/2017)   COPD (chronic obstructive pulmonary disease) (HCC)    CVA (cerebral vascular accident) (HCC) 05/2015   R sided weakness (05/07/2017)   Diabetes mellitus without complication (HCC)    Phreesia 08/08/2020   GERD (gastroesophageal reflux disease)    Gout    History of colonic polyps 04/03/2017   Added automatically from request for surgery [id number]   Hyperlipidemia    Hypertension    Myocardial infarction (HCC) 1999   Obesity    OSA on CPAP    Oxygen  deficiency    Phreesia 08/08/2020   Pneumonia    a few times; last time was ~ 06/2013 (05/07/2017)   Psoriasis    Stroke (HCC)    Phreesia 08/08/2020   Thyroid  disease    Phreesia 08/08/2020   Tussive syncope 05/01/2015   Type II diabetes mellitus (HCC)    Vitamin B12 deficiency 05/01/2015   Past Surgical History:  Procedure Laterality Date   APPENDECTOMY     BIOPSY  05/15/2023   Procedure: BIOPSY;  Surgeon: Cinderella Deatrice FALCON, MD;  [facility]: [facility];  Service: Endoscopy;;   CATARACT EXTRACTION W/PHACO Left 10/07/2016   Procedure: CATARACT EXTRACTION PHACO AND INTRAOCULAR LENS PLACEMENT (IOC);  Surgeon: Roz Anes, MD;  Location: [facility];  Service: Ophthalmology;  Laterality: Left;  CDE: 13.49   CATARACT EXTRACTION W/PHACO Right 10/28/2016   Procedure: CATARACT EXTRACTION PHACO AND INTRAOCULAR LENS PLACEMENT (IOC);  Surgeon: Roz Anes, MD;  Location: [facility];   Service: Ophthalmology;  Laterality: Right;  CDE: 16.71   COLONOSCOPY  03/17/2012   Procedure: COLONOSCOPY;  Surgeon: Claudis RAYMOND Rivet, MD;  [facility]: [facility];  Service: Endoscopy;  Laterality: N/A;  830   COLONOSCOPY N/A 06/25/2017   Procedure: COLONOSCOPY;  Surgeon: Rivet Claudis RAYMOND, MD;  [facility];  Service: Endoscopy;  Laterality: N/A;  930   COLONOSCOPY WITH PROPOFOL  N/A 03/12/2023   Procedure: COLONOSCOPY WITH PROPOFOL ;  Surgeon: Cinderella Deatrice FALCON, MD;  [facility];  Service: Endoscopy;  Laterality: N/A;  9:30am;asa 3   CORONARY ANGIOPLASTY WITH STENT PLACEMENT  1999- 2009-08/30/2013   counting today's, I have 5 stents (08/30/2013)   CORONARY ANGIOPLASTY WITH STENT PLACEMENT  05/07/2017   CORONARY STENT INTERVENTION N/A 05/07/2017   Procedure: CORONARY STENT INTERVENTION;  Surgeon: Levern Hutching, MD;  [facility];  Service: Cardiovascular;  Laterality: N/A;   CYSTOSCOPY WITH URETHRAL DILATATION N/A 11/26/2012   Procedure: CYSTOSCOPY WITH URETHRAL DILATATION;  Surgeon: Norleen JINNY Seltzer, MD;  Location: [facility];  Service: Urology;  Laterality: N/A;   ESOPHAGOGASTRODUODENOSCOPY (EGD) WITH PROPOFOL  N/A 05/15/2023   Procedure: ESOPHAGOGASTRODUODENOSCOPY (EGD) WITH PROPOFOL ;  Surgeon: Cinderella Deatrice FALCON, MD;  [facility];  Service: Endoscopy;  Laterality: N/A;  10:25am;asa 3   LEFT HEART CATH AND CORONARY ANGIOGRAPHY N/A 05/07/2017  Procedure: LEFT HEART CATH AND CORONARY ANGIOGRAPHY;  Surgeon: Levern Hutching, MD;  [facility];  Service: Cardiovascular;  Laterality: N/A;   LEFT HEART CATHETERIZATION WITH CORONARY ANGIOGRAM N/A 08/30/2013   Procedure: LEFT HEART CATHETERIZATION WITH CORONARY ANGIOGRAM;  Surgeon: Hutching LOISE Levern, MD;  Location: [facility];  Service: Cardiovascular;  Laterality: N/A;   PERCUTANEOUS CORONARY STENT INTERVENTION (PCI-S) Right 08/30/2013   Procedure: PERCUTANEOUS CORONARY STENT INTERVENTION (PCI-S);   Surgeon: Hutching LOISE Levern, MD;  Location: [facility];  Service: Cardiovascular;  Laterality: Right;   POLYPECTOMY  03/12/2023   Procedure: POLYPECTOMY;  Surgeon: Cinderella Deatrice FALCON, MD;  [facility];  Service: Endoscopy;;   Social History   Socioeconomic History   Marital status: Married    Spouse name: Not on file   Number of children: 2   Years of education: 11   Highest education level: 12th grade  Occupational History   Occupation: Restaurant manager, fast food     Comment: retired   Tobacco Use   Smoking status: Former    Current packs/day: 0.00    Average packs/day: 0.5 packs/day for 33.0 years (16.5 ttl pk-yrs)    Types: Cigarettes    Start date: 11/28/1969    Quit date: 11/29/2002    Years since quitting: 21.1    Passive exposure: Past   Smokeless tobacco: Never  Vaping Use   Vaping status: Never Used  Substance and Sexual Activity   Alcohol use: No   Drug use: No   Sexual activity: Not Currently  Other Topics Concern   Not on file  Social History Narrative   Patient drinks about 3 cups of soda daily.    Patient is left handed.    Lives alone with wife    Social Drivers of Health   Financial Resource Strain: Low Risk  (08/26/2023)   Overall Financial Resource Strain (CARDIA)    Difficulty of Paying Living Expenses: Not hard at all  Food Insecurity: No Food Insecurity (08/26/2023)   Hunger Vital Sign    Worried About Running Out of Food in the Last Year: Never true    Ran Out of Food in the Last Year: Never true  Transportation Needs: No Transportation Needs (08/26/2023)   PRAPARE - Administrator, Civil Service (Medical): No    Lack of Transportation (Non-Medical): No  Physical Activity: Inactive (08/26/2023)   Exercise Vital Sign    Days of Exercise per Week: 0 days    Minutes of Exercise per Session: 0 min  Stress: No Stress Concern Present (08/26/2023)   [employer] of Occupational Health - Occupational Stress Questionnaire    Feeling of  Stress : Not at all  Social Connections: Socially Isolated (08/26/2023)   Social Connection and Isolation Panel    Frequency of Communication with Friends and Family: Once a week    Frequency of Social Gatherings with Friends and Family: Never    Attends Religious Services: Never    Database administrator or Organizations: No    Attends Banker Meetings: Never    Marital Status: Married   Outpatient Encounter Medications as of 02/02/2024  Medication Sig   acetaminophen  (TYLENOL ) 500 MG tablet Take 1,000 mg by mouth every 6 (six) hours as needed for moderate pain (left thigh).    albuterol  (VENTOLIN  HFA) 108 (90 Base) MCG/ACT inhaler INHALE 1 TO 2 PUFFS EVERY 6 HOURS AS NEEDED FOR WHEEZING OR SHORTNESS OF BREATH   allopurinol  (ZYLOPRIM ) 300  MG tablet TAKE 1 TABLET EVERY DAY   amLODipine  (NORVASC ) 2.5 MG tablet Take 1 tablet (2.5 mg total) by mouth daily.   aspirin  EC 81 MG tablet Take 81 mg by mouth daily.   Blood Glucose Calibration (TRUE METRIX LEVEL 1) Low SOLN Use as directed   Blood Glucose Monitoring Suppl (TRUE METRIX METER) w/Device KIT USE AS DIRECTED   clobetasol  ointment (TEMOVATE ) 0.05 % Apply twice daily to affected areas for 14 days then once daily, as needed   clopidogrel  (PLAVIX ) 75 MG tablet TAKE 1 TABLET EVERY DAY   DROPLET INSULIN  SYRINGE 31G X 5/16 1 ML MISC USE AS DIRECTED IN THE MORNING AND AT BEDTIME   ezetimibe  (ZETIA ) 10 MG tablet TAKE 1 TABLET EVERY DAY   glucose blood (TRUE METRIX BLOOD GLUCOSE TEST) test strip TEST BLOOD SUGAR FOUR TIMES DAILY   insulin  NPH-regular Human (NOVOLIN  70/30) (70-30) 100 UNIT/ML injection INJECT 45 UNITS WITH BREAKFAST AND 35 UNITS WITH SUPPER IF GLUCOSE IS ABOVE 90 AND EATING. OPENED VIAL EXPIRES AFTER 42 DAYS (Patient taking differently: Inject 30-40 Units into the skin 2 (two) times daily with a meal. Patient injects 40 units in the morning and 30 units at night.)   isosorbide  mononitrate (IMDUR ) 30 MG 24 hr tablet TAKE 1  TABLET EVERY DAY   levETIRAcetam  (KEPPRA ) 500 MG tablet TAKE 1 TABLET TWICE DAILY   metoprolol  tartrate (LOPRESSOR ) 50 MG tablet TAKE 1/2 TABLET TWICE DAILY   nitroGLYCERIN  (NITROSTAT ) 0.4 MG SL tablet Place 1 tablet (0.4 mg total) under the tongue every 5 (five) minutes as needed for chest pain.   pantoprazole  (PROTONIX ) 40 MG tablet TAKE 1 TABLET EVERY DAY BEFORE BREAKFAST   polyethylene glycol (MIRALAX  / GLYCOLAX ) 17 g packet Take 17 g by mouth daily as needed.   pravastatin  (PRAVACHOL ) 40 MG tablet TAKE 2 TABLETS EVERY DAY   Probiotic Product (ALIGN) 4 MG CAPS Take 1 capsule (4 mg total) by mouth daily.   spironolactone  (ALDACTONE ) 50 MG tablet TAKE 1 TABLET EVERY DAY   TRUEplus Lancets 33G MISC TEST BLOOD SUGAR FOUR TIMES DAILY   UNABLE TO FIND CPAP mask and tubing  DX G47.33   [DISCONTINUED] levothyroxine  (SYNTHROID ) 112 MCG tablet Take 1 tablet (112 mcg total) by mouth daily.   [DISCONTINUED] Vitamin D , Ergocalciferol , (DRISDOL ) 1.25 MG (50000 UNIT) CAPS capsule Take 1 capsule (50,000 Units total) by mouth every 7 (seven) days.   levothyroxine  (SYNTHROID ) 112 MCG tablet Take 1 tablet (112 mcg total) by mouth daily.   Vitamin D , Ergocalciferol , (DRISDOL ) 1.25 MG (50000 UNIT) CAPS capsule Take 1 capsule (50,000 Units total) by mouth every 7 (seven) days.   No facility-administered encounter medications on file as of 02/02/2024.   ALLERGIES: No Known Allergies VACCINATION STATUS: Immunization History  Administered Date(s) Administered   Fluad Quad(high Dose 65+) 01/31/2019, 03/01/2020, 06/20/2021, 03/11/2022   Fluad Trivalent(High Dose 65+) 02/04/2023   INFLUENZA, HIGH DOSE SEASONAL PF 02/22/2018   Influenza Whole 03/18/2005   Influenza,inj,Quad PF,6+ Mos 03/19/2017   PFIZER(Purple Top)SARS-COV-2 Vaccination 06/25/2019, 07/16/2019, 04/22/2020   Pneumococcal Conjugate-13 12/08/2013   Pneumococcal Polysaccharide-23 06/09/2012   Td 12/04/2003   Tdap 03/06/2017    Diabetes He  presents for his follow-up diabetic visit. He has type 2 diabetes mellitus. Onset time: He was diagnosed at approximate age of 50 years. His disease course has been stable. There are no hypoglycemic associated symptoms. Pertinent negatives for hypoglycemia include no confusion, nervousness/anxiousness, pallor, seizures or tremors. Associated symptoms include weight loss. Pertinent  negatives for diabetes include no fatigue, no polydipsia, no polyphagia, no polyuria and no weakness. There are no hypoglycemic complications. Symptoms are stable. Diabetic complications include heart disease, nephropathy and peripheral neuropathy. Risk factors for coronary artery disease include dyslipidemia, diabetes mellitus, male sex, obesity, tobacco exposure, sedentary lifestyle and hypertension. Current diabetic treatment includes insulin  injections. He is compliant with treatment most of the time. His weight is decreasing steadily. He is following a generally healthy diet. When asked about meal planning, he reported none. He has not had a previous visit with a dietitian. He rarely participates in exercise. His home blood glucose trend is decreasing steadily. His breakfast blood glucose range is generally 70-90 mg/dl. His dinner blood glucose range is generally 130-140 mg/dl. (He presents today with his logs, no meter, showing at goal glycemic profile overall.  His POCT A1c today is 6.3%, essentially unchanged from previous visit.  He denies any significant hypoglycemia.  He did have his insulin  doses decreased during recent hospitalization.) An ACE inhibitor/angiotensin II receptor blocker is not being taken. He does not see a podiatrist.Eye exam is current.  Thyroid  Problem Presents for follow-up visit. Symptoms include weight loss. Patient reports no anxiety, cold intolerance, constipation, depressed mood, diarrhea, fatigue, heat intolerance, tremors or weight gain. The symptoms have been stable.     Review of  systems  Constitutional: + steadily decreasing body weight,  current Body mass index is 36.13 kg/m. , no fatigue, no subjective hyperthermia, no subjective hypothermia Eyes: no blurry vision, no xerophthalmia ENT: no sore throat, no nodules palpated in throat, no dysphagia/odynophagia, no hoarseness Cardiovascular: no chest pain, no shortness of breath, no palpitations, no leg swelling Respiratory: no cough, no shortness of breath Gastrointestinal: no nausea/vomiting/diarrhea Musculoskeletal: + muscle/joint aches/cramps, walks with platform walker due to mobility deficits from previous CVA Skin: no rashes, no hyperemia Neurological: no tremors, no tingling, no dizziness, numbness reported in RUE and RLE (from previous stroke) Psychiatric: no depression, no anxiety    Objective:    BP 136/80 (BP Location: Left Arm, Patient Position: Sitting, Cuff Size: Large)   Pulse 68   Ht 5' 10 (1.778 m)   Wt 251 lb 12.8 oz (114.2 kg)   BMI 36.13 kg/m   Wt Readings from Last 3 Encounters:  02/02/24 251 lb 12.8 oz (114.2 kg)  11/24/23 252 lb (114.3 kg)  08/26/23 245 lb (111.1 kg)     BP Readings from Last 3 Encounters:  02/02/24 136/80  11/24/23 (!) 171/75  11/11/23 (!) 144/82    Physical Exam- Limited  Constitutional:  Body mass index is 36.13 kg/m. , not in acute distress, normal state of mind Eyes:  EOMI, no exophthalmos Musculoskeletal: no gross deformities, walks with walker due to previous CVA Skin:  no rashes, no hyperemia Neurological: no tremor with outstretched hands  Diabetic Foot Exam - Simple   No data filed     CMP     Component Value Date/Time   NA 144 11/11/2023 1046   K 4.1 11/11/2023 1046   CL 100 11/11/2023 1046   CO2 21 11/11/2023 1046   GLUCOSE 66 (L) 11/11/2023 1046   GLUCOSE 129 (H) 05/11/2023 1333   BUN 14 11/11/2023 1046   CREATININE 1.29 (H) 11/11/2023 1046   CREATININE 1.12 02/16/2020 0838   CALCIUM  9.5 11/11/2023 1046   PROT 7.4 11/11/2023  1046   ALBUMIN 4.1 11/11/2023 1046   AST 13 11/11/2023 1046   ALT 6 11/11/2023 1046   ALKPHOS 87 11/11/2023 1046  BILITOT 0.3 11/11/2023 1046   GFRNONAA >60 05/11/2023 1333   GFRNONAA 63 02/16/2020 0838   GFRAA 53 (L) 06/20/2020 0907   GFRAA 73 02/16/2020 0838     Diabetic Labs (most recent): Lab Results  Component Value Date   HGBA1C 6.3 (A) 02/02/2024   HGBA1C 6.2 07/30/2023   HGBA1C 6.1 (A) 01/30/2023   MICROALBUR 6.4 10/11/2019   MICROALBUR 4.2 07/13/2018   MICROALBUR 7.8 (H) 03/23/2014     Lipid Panel ( most recent) Lipid Panel     Component Value Date/Time   CHOL 115 11/11/2023 1046   TRIG 121 11/11/2023 1046   HDL 32 (L) 11/11/2023 1046   CHOLHDL 3.6 11/11/2023 1046   CHOLHDL 5.3 (H) 10/11/2019 0924   VLDL 35 04/20/2017 1130   LDLCALC 61 11/11/2023 1046   LDLCALC 67 10/11/2019 0924   LDLDIRECT 50 08/18/2014 0737      Assessment & Plan:   1) Type 2 diabetes mellitus with multiple competitions including diabetic nephropathy  -His diabetes is complicated by coronary artery disease, CVA, CKD and patient remains at a high risk for more acute and chronic complications of diabetes which include CAD, CVA, CKD, retinopathy, and neuropathy. These are all discussed in detail with the patient.  He presents today with his logs, no meter, showing at goal glycemic profile overall.  His POCT A1c today is 6.3%, essentially unchanged from previous visit.  He denies any significant hypoglycemia.  He did have his insulin  doses decreased during recent hospitalization.  - Recent labs reviewed.  - Nutritional counseling repeated at each appointment due to patients tendency to fall back in to old habits.  - The patient admits there is a room for improvement in their diet and drink choices. -  Suggestion is made for the patient to avoid simple carbohydrates from their diet including Cakes, Sweet Desserts / Pastries, Ice Cream, Soda (diet and regular), Sweet Tea, Candies, Chips,  Cookies, Sweet Pastries, Store Bought Juices, Alcohol in Excess of 1-2 drinks a day, Artificial Sweeteners, Coffee Creamer, and Sugar-free Products. This will help patient to have stable blood glucose profile and potentially avoid unintended weight gain.   - I encouraged the patient to switch to unprocessed or minimally processed complex starch and increased protein intake (animal or plant source), fruits, and vegetables.   - Patient is advised to stick to a routine mealtimes to eat 3 meals a day and avoid unnecessary snacks (to snack only to correct hypoglycemia).  - I have approached patient with the following individualized plan to manage diabetes and patient agrees.  - He has done reasonably very well on premixed Novolin  70/30. He wishes to stay on the same regimen for cost reasons.   -He is advised to lower Novolin  70/30  to 35 units with breakfast and 25 units with supper if glucose is above 90 and he is eating given tightening glycemic profile.  -He is advised to continue monitoring blood glucose at least 3 times per day, before injecting insulin  at breakfast and supper, and before bed.  He is instructed to call the clinic if he has readings less than 70 or greater than 300 for 3 tests in a row.  -He is warned not to take insulin  without proper monitoring per orders.  - Patient specific target  for A1c; LDL, HDL, Triglycerides, and  Waist Circumference were discussed in detail.  2) BP/HTN:  His blood pressure is controlled to target for his age.  He is advised to continue meds as  prescribed by PCP.    3) Lipids/HPL:  His most recent lipid panel from 11/11/23 shows controlled LDL of 61.  He is advised to continue Pravastatin  40 mg po daily at bedtime.  Side effects and precautions discussed with him.  He is advised to avoid fried foods and butter.    4)  Weight/Diet: His Body mass index is 36.13 kg/m.-  He is a candidate for modest weight loss.  He cannot exercise optimally, limited  by pulmonary sufficiency due to COPD and previous CVA.  CDE consult in progress, exercise, and carbohydrates information provided.  5) Hypothyroidism:  -His previsit TFTs are consistent with appropriate hormone replacement.  He is advised to continue Levothyroxine  112 mcg po daily before breakfast.  Will recheck TFTs prior to next visit and adjust dose accordingly.   - We discussed about the correct intake of his thyroid  hormone, on empty stomach at fasting, with water , separated by at least 30 minutes from breakfast and other medications,  and separated by more than 4 hours from calcium , iron, multivitamins, acid reflux medications (PPIs). -Patient is made aware of the fact that thyroid  hormone replacement is needed for life, dose to be adjusted by periodic monitoring of thyroid  function tests.  6) Vitamin D  deficiency His most recent vitamin D  level on 11/11/23 was 37.5.  He is not sure if he is still taking his supplement as his wife manages his medication.  I did recommend restarting Ergocalciferol , I sent this in for him.  7) Chronic Care/Health Maintenance: -Patient is on Statin medications and encouraged to continue to follow up with Ophthalmology, nephrology, Podiatrist at least yearly or according to recommendations, and advised to stay away from smoking. I have recommended yearly flu vaccine and pneumonia vaccination at least every 5 years;  and  sleep for at least 7 hours a day.   - I advised patient to maintain close follow up with Antonetta Rollene BRAVO, MD for primary care needs.     I spent  31  minutes in the care of the patient today including review of labs from CMP, Lipids, Thyroid  Function, Hematology (current and previous including abstractions from other facilities); face-to-face time discussing  his blood glucose readings/logs, discussing hypoglycemia and hyperglycemia episodes and symptoms, medications doses, his options of short and long term treatment based on the latest  standards of care / guidelines;  discussion about incorporating lifestyle medicine;  and documenting the encounter. Risk reduction counseling performed per USPSTF guidelines to reduce obesity and cardiovascular risk factors.     Please refer to Patient Instructions for Blood Glucose Monitoring and Insulin /Medications Dosing Guide  in media tab for additional information. Please  also refer to  Patient Self Inventory in the Media  tab for reviewed elements of pertinent patient history.  [name] participated in the discussions, expressed understanding, and voiced agreement with the above plans.  All questions were answered to his satisfaction. he is encouraged to contact clinic should he have any questions or concerns prior to his return visit.   Follow up plan: -Return in about 6 months (around 08/01/2024) for Diabetes F/U with A1c in office, Thyroid  follow up, Previsit labs, Bring meter and logs.  Benton Rio, [facility] [address] [place], [place] [address] Phone: [phone] Fax: [phone]  02/02/2024, 10:56 AM

## 2024-02-10 DIAGNOSIS — I10 Essential (primary) hypertension: Secondary | ICD-10-CM | POA: Diagnosis not present

## 2024-02-10 DIAGNOSIS — E119 Type 2 diabetes mellitus without complications: Secondary | ICD-10-CM | POA: Diagnosis not present

## 2024-02-10 DIAGNOSIS — E782 Mixed hyperlipidemia: Secondary | ICD-10-CM | POA: Diagnosis not present

## 2024-02-10 DIAGNOSIS — I25118 Atherosclerotic heart disease of native coronary artery with other forms of angina pectoris: Secondary | ICD-10-CM | POA: Diagnosis not present

## 2024-02-28 ENCOUNTER — Other Ambulatory Visit: Payer: Self-pay | Admitting: Family Medicine

## 2024-02-28 DIAGNOSIS — I2510 Atherosclerotic heart disease of native coronary artery without angina pectoris: Secondary | ICD-10-CM

## 2024-03-15 DIAGNOSIS — Z9989 Dependence on other enabling machines and devices: Secondary | ICD-10-CM | POA: Diagnosis not present

## 2024-03-15 DIAGNOSIS — Z833 Family history of diabetes mellitus: Secondary | ICD-10-CM | POA: Diagnosis not present

## 2024-03-15 DIAGNOSIS — H409 Unspecified glaucoma: Secondary | ICD-10-CM | POA: Diagnosis not present

## 2024-03-15 DIAGNOSIS — E1121 Type 2 diabetes mellitus with diabetic nephropathy: Secondary | ICD-10-CM | POA: Diagnosis not present

## 2024-03-15 DIAGNOSIS — J479 Bronchiectasis, uncomplicated: Secondary | ICD-10-CM | POA: Diagnosis not present

## 2024-03-15 DIAGNOSIS — I69351 Hemiplegia and hemiparesis following cerebral infarction affecting right dominant side: Secondary | ICD-10-CM | POA: Diagnosis not present

## 2024-03-15 DIAGNOSIS — M109 Gout, unspecified: Secondary | ICD-10-CM | POA: Diagnosis not present

## 2024-03-15 DIAGNOSIS — Z794 Long term (current) use of insulin: Secondary | ICD-10-CM | POA: Diagnosis not present

## 2024-03-15 DIAGNOSIS — I252 Old myocardial infarction: Secondary | ICD-10-CM | POA: Diagnosis not present

## 2024-03-15 DIAGNOSIS — Z8249 Family history of ischemic heart disease and other diseases of the circulatory system: Secondary | ICD-10-CM | POA: Diagnosis not present

## 2024-03-15 DIAGNOSIS — K5900 Constipation, unspecified: Secondary | ICD-10-CM | POA: Diagnosis not present

## 2024-03-15 DIAGNOSIS — G4733 Obstructive sleep apnea (adult) (pediatric): Secondary | ICD-10-CM | POA: Diagnosis not present

## 2024-03-15 DIAGNOSIS — E039 Hypothyroidism, unspecified: Secondary | ICD-10-CM | POA: Diagnosis not present

## 2024-03-15 DIAGNOSIS — Z7902 Long term (current) use of antithrombotics/antiplatelets: Secondary | ICD-10-CM | POA: Diagnosis not present

## 2024-03-15 DIAGNOSIS — H9193 Unspecified hearing loss, bilateral: Secondary | ICD-10-CM | POA: Diagnosis not present

## 2024-03-15 DIAGNOSIS — I25119 Atherosclerotic heart disease of native coronary artery with unspecified angina pectoris: Secondary | ICD-10-CM | POA: Diagnosis not present

## 2024-03-15 DIAGNOSIS — Z6836 Body mass index (BMI) 36.0-36.9, adult: Secondary | ICD-10-CM | POA: Diagnosis not present

## 2024-03-15 DIAGNOSIS — R32 Unspecified urinary incontinence: Secondary | ICD-10-CM | POA: Diagnosis not present

## 2024-03-15 DIAGNOSIS — E1142 Type 2 diabetes mellitus with diabetic polyneuropathy: Secondary | ICD-10-CM | POA: Diagnosis not present

## 2024-03-15 DIAGNOSIS — I509 Heart failure, unspecified: Secondary | ICD-10-CM | POA: Diagnosis not present

## 2024-03-15 DIAGNOSIS — I700 Atherosclerosis of aorta: Secondary | ICD-10-CM | POA: Diagnosis not present

## 2024-03-15 DIAGNOSIS — Z79890 Hormone replacement therapy (postmenopausal): Secondary | ICD-10-CM | POA: Diagnosis not present

## 2024-03-15 DIAGNOSIS — G40909 Epilepsy, unspecified, not intractable, without status epilepticus: Secondary | ICD-10-CM | POA: Diagnosis not present

## 2024-03-15 DIAGNOSIS — Z7982 Long term (current) use of aspirin: Secondary | ICD-10-CM | POA: Diagnosis not present

## 2024-03-15 DIAGNOSIS — I110 Hypertensive heart disease with heart failure: Secondary | ICD-10-CM | POA: Diagnosis not present

## 2024-03-24 ENCOUNTER — Encounter (INDEPENDENT_AMBULATORY_CARE_PROVIDER_SITE_OTHER): Payer: Self-pay | Admitting: Gastroenterology

## 2024-04-04 ENCOUNTER — Ambulatory Visit (INDEPENDENT_AMBULATORY_CARE_PROVIDER_SITE_OTHER): Admitting: Family Medicine

## 2024-04-04 ENCOUNTER — Encounter: Payer: Self-pay | Admitting: Family Medicine

## 2024-04-04 VITALS — BP 120/71 | HR 74 | Resp 18 | Ht 70.0 in | Wt 255.1 lb

## 2024-04-04 DIAGNOSIS — E039 Hypothyroidism, unspecified: Secondary | ICD-10-CM

## 2024-04-04 DIAGNOSIS — Z23 Encounter for immunization: Secondary | ICD-10-CM

## 2024-04-04 DIAGNOSIS — E790 Hyperuricemia without signs of inflammatory arthritis and tophaceous disease: Secondary | ICD-10-CM

## 2024-04-04 DIAGNOSIS — R569 Unspecified convulsions: Secondary | ICD-10-CM

## 2024-04-04 DIAGNOSIS — G8191 Hemiplegia, unspecified affecting right dominant side: Secondary | ICD-10-CM

## 2024-04-04 DIAGNOSIS — E1169 Type 2 diabetes mellitus with other specified complication: Secondary | ICD-10-CM

## 2024-04-04 DIAGNOSIS — E1159 Type 2 diabetes mellitus with other circulatory complications: Secondary | ICD-10-CM | POA: Diagnosis not present

## 2024-04-04 DIAGNOSIS — E785 Hyperlipidemia, unspecified: Secondary | ICD-10-CM

## 2024-04-04 DIAGNOSIS — I10 Essential (primary) hypertension: Secondary | ICD-10-CM

## 2024-04-04 DIAGNOSIS — E118 Type 2 diabetes mellitus with unspecified complications: Secondary | ICD-10-CM

## 2024-04-04 NOTE — Assessment & Plan Note (Signed)
 Check level , maintained on allopurinol 

## 2024-04-04 NOTE — Assessment & Plan Note (Signed)
 Diabetes associated with hypertension, hyperlipidemia, CKD, arthritis, and neurological disease  Mr. Bulkley is reminded of the importance of commitment to daily physical activity for 30 minutes or more, as able and the need to limit carbohydrate intake to 30 to 60 grams per meal to help with blood sugar control.   The need to take medication as prescribed, test blood sugar as directed, and to call between visits if there is a concern that blood sugar is uncontrolled is also discussed.   Mr. Poucher is reminded of the importance of daily foot exam, annual eye examination, and good blood sugar, blood pressure and cholesterol control.     Latest Ref Rng & Units 02/02/2024   10:46 AM 11/11/2023   10:46 AM 07/30/2023   12:00 AM 06/09/2023   10:33 AM 05/11/2023    1:33 PM  Diabetic Labs  HbA1c 4.0 - 5.6 % 6.3   6.2        Micro/Creat Ratio 0 - 29 mg/g creat  42      Chol 100 - 199 mg/dL  884   96    HDL >60 mg/dL  32   28    Calc LDL 0 - 99 mg/dL  61   47    Triglycerides 0 - 149 mg/dL  878   884    Creatinine 0.76 - 1.27 mg/dL  8.70    8.79      This result is from an external source.      04/04/2024    9:05 AM 02/02/2024   10:31 AM 11/24/2023   10:55 AM 11/11/2023   10:26 AM 11/11/2023    9:53 AM 08/26/2023   10:11 AM 08/23/2023    5:30 AM  BP/Weight  Systolic BP 120 136 171 144 166 -- 148  Diastolic BP 71 80 75 82 81 -- 68  Wt. (Lbs) 255.12 251.8 252   245   BMI 36.61 kg/m2 36.13 kg/m2 36.16 kg/m2   35.15 kg/m2       Latest Ref Rng & Units 11/11/2023    9:40 AM 07/29/2023   12:00 AM  Foot/eye exam completion dates  Eye Exam No Retinopathy  No Retinopathy      Foot Form Completion  Done      This result is from an external source.

## 2024-04-04 NOTE — Assessment & Plan Note (Signed)
 Grade 4 power in upper and lower extremities unchanged

## 2024-04-04 NOTE — Assessment & Plan Note (Signed)
 After obtaining informed consent, the vaccine is  administered , with no adverse effect noted at the time of administration.

## 2024-04-04 NOTE — Patient Instructions (Addendum)
 F/u in 5 months  Excellent blood pressure and blood sugar, keep up the great work  Labs today  Use muscle rub or Bengay or Aspercreme for soreness over muscles in the mornings and stiffness of left neck  Continue all the good things you are doing , be careful not to fall  Thanks for choosing [facility], we consider it a privelige to serve you.

## 2024-04-04 NOTE — Progress Notes (Signed)
 [name]     MRN: [id number]      DOB: 08/11/1940  Chief Complaint  Patient presents with   Hypertension    3 month follow up     HPI [name] is here for follow up and re-evaluation of chronic medical conditions, medication management and review of any available recent lab and radiology data.  Preventive health is updated, specifically  Cancer screening and Immunization.   Questions or concerns regarding consultations or procedures which the PT has had in the interim are  addressed. The PT denies any adverse reactions to current medications since the last visit.  C/o generalized soreness in the morning on awakening primarily on Right upper and lower extremities also c/o left neck stiffness intermittently Denies polyuria, polydipsia, blurred vision , or hypoglycemic episodes.    ROS Denies recent fever or chills. Denies sinus pressure, nasal congestion, ear pain or sore throat. Denies chest congestion, productive cough or wheezing. Denies chest pains, palpitations and leg swelling Chronic  constipation.   Denies dysuria, frequency, hesitancy or incontinence. C/o s joint pain, swelling and limitation in mobility. Denies headaches, seizures, right upper asnd lower numbnessDenies depression, anxiety or insomnia. Denies skin break down or rash.   PE  BP 120/71   Pulse 74   Resp 18   Ht 5' 10 (1.778 m)   Wt 255 lb 1.9 oz (115.7 kg)   SpO2 92%   BMI 36.61 kg/m   Patient alert and oriented and in no cardiopulmonary distress.  HEENT: No facial asymmetry, EOMI,     Neck decreased rOM with left neck spasm  Chest: Clear to auscultation bilaterally.  CVS: S1, S2 no murmurs, no S3.Regular rate.  ABD: Soft non tender.   Ext: No edema  MS: decreased  ROM spine, shoulders, hips and knees.  Skin: Intact, no ulcerations or rash noted.  Psych: Good eye contact, normal affect. Memory intact not anxious or depressed appearing.  CNS: CN 2-12 intact, power,  normal  throughout.no focal deficits noted.   Assessment & Plan  Influenza vaccination administered at current visit After obtaining informed consent, the vaccine is  administered , with no adverse effect noted at the time of administration.   Essential hypertension Controlled, no change in medication   Controlled diabetes mellitus type 2 with complications (HCC) Diabetes associated with hypertension, hyperlipidemia, CKD, arthritis, and neurological disease  [name] is reminded of the importance of commitment to daily physical activity for 30 minutes or more, as able and the need to limit carbohydrate intake to 30 to 60 grams per meal to help with blood sugar control.   The need to take medication as prescribed, test blood sugar as directed, and to call between visits if there is a concern that blood sugar is uncontrolled is also discussed.   [name] is reminded of the importance of daily foot exam, annual eye examination, and good blood sugar, blood pressure and cholesterol control.     Latest Ref Rng & Units 02/02/2024   10:46 AM 11/11/2023   10:46 AM 07/30/2023   12:00 AM 06/09/2023   10:33 AM 05/11/2023    1:33 PM  Diabetic Labs  HbA1c 4.0 - 5.6 % 6.3   6.2        Micro/Creat Ratio 0 - 29 mg/g creat  42      Chol 100 - 199 mg/dL  884   96    HDL >60 mg/dL  32   28    Calc  LDL 0 - 99 mg/dL  61   47    Triglycerides 0 - 149 mg/dL  878   884    Creatinine 0.76 - 1.27 mg/dL  8.70    8.79      This result is from an external source.      04/04/2024    9:05 AM 02/02/2024   10:31 AM 11/24/2023   10:55 AM 11/11/2023   10:26 AM 11/11/2023    9:53 AM 08/26/2023   10:11 AM 08/23/2023    5:30 AM  BP/Weight  Systolic BP 120 136 171 144 166 -- 148  Diastolic BP 71 80 75 82 81 -- 68  Wt. (Lbs) 255.12 251.8 252   245   BMI 36.61 kg/m2 36.13 kg/m2 36.16 kg/m2   35.15 kg/m2       Latest Ref Rng & Units 11/11/2023    9:40 AM 07/29/2023   12:00 AM  Foot/eye exam completion dates  Eye  Exam No Retinopathy  No Retinopathy      Foot Form Completion  Done      This result is from an external source.        Right hemiparesis (HCC) Grade 4 power in upper and lower extremities unchanged  Partial seizure (HCC) Controlled, no change in medication   Hyperlipidemia LDL goal <70 Hyperlipidemia:Low fat diet discussed and encouraged.   Lipid Panel  Lab Results  Component Value Date   CHOL 115 11/11/2023   HDL 32 (L) 11/11/2023   LDLCALC 61 11/11/2023   LDLDIRECT 50 08/18/2014   TRIG 121 11/11/2023   CHOLHDL 3.6 11/11/2023     Updated lab needed at/ before next visit.   Morbid obesity (HCC)  Patient re-educated about  the importance of commitment to a  minimum of 150 minutes of exercise per week as able.  The importance of healthy food choices with portion control discussed, as well as eating regularly and within a 12 hour window most days. The need to choose clean , green food 50 to 75% of the time is discussed, as well as to make water  the primary drink and set a goal of 64 ounces water  daily.       04/04/2024    9:05 AM 02/02/2024   10:31 AM 11/24/2023   10:55 AM  Weight /BMI  Weight 255 lb 1.9 oz 251 lb 12.8 oz 252 lb  Height 5' 10 (1.778 m) 5' 10 (1.778 m) 5' 10 (1.778 m)  BMI 36.61 kg/m2 36.13 kg/m2 36.16 kg/m2    unchanged  Hypothyroidism Updated lab needed at/ before next visit.

## 2024-04-04 NOTE — Assessment & Plan Note (Signed)
 Controlled, no change in medication

## 2024-04-04 NOTE — Assessment & Plan Note (Signed)
  Patient re-educated about  the importance of commitment to a  minimum of 150 minutes of exercise per week as able.  The importance of healthy food choices with portion control discussed, as well as eating regularly and within a 12 hour window most days. The need to choose clean , green food 50 to 75% of the time is discussed, as well as to make water  the primary drink and set a goal of 64 ounces water  daily.       04/04/2024    9:05 AM 02/02/2024   10:31 AM 11/24/2023   10:55 AM  Weight /BMI  Weight 255 lb 1.9 oz 251 lb 12.8 oz 252 lb  Height 5' 10 (1.778 m) 5' 10 (1.778 m) 5' 10 (1.778 m)  BMI 36.61 kg/m2 36.13 kg/m2 36.16 kg/m2    unchanged

## 2024-04-04 NOTE — Assessment & Plan Note (Signed)
 Hyperlipidemia:Low fat diet discussed and encouraged.   Lipid Panel  Lab Results  Component Value Date   CHOL 115 11/11/2023   HDL 32 (L) 11/11/2023   LDLCALC 61 11/11/2023   LDLDIRECT 50 08/18/2014   TRIG 121 11/11/2023   CHOLHDL 3.6 11/11/2023     Updated lab needed at/ before next visit.

## 2024-04-04 NOTE — Assessment & Plan Note (Signed)
 Updated lab needed at/ before next visit.

## 2024-04-05 ENCOUNTER — Ambulatory Visit: Payer: Self-pay | Admitting: Family Medicine

## 2024-04-05 LAB — CMP14+EGFR
ALT: 8 IU/L (ref 0–44)
AST: 14 IU/L (ref 0–40)
Albumin: 4 g/dL (ref 3.7–4.7)
Alkaline Phosphatase: 74 IU/L (ref 48–129)
BUN/Creatinine Ratio: 22 (ref 10–24)
BUN: 38 mg/dL — ABNORMAL HIGH (ref 8–27)
Bilirubin Total: 0.4 mg/dL (ref 0.0–1.2)
CO2: 19 mmol/L — ABNORMAL LOW (ref 20–29)
Calcium: 9.5 mg/dL (ref 8.6–10.2)
Chloride: 104 mmol/L (ref 96–106)
Creatinine, Ser: 1.71 mg/dL — ABNORMAL HIGH (ref 0.76–1.27)
Globulin, Total: 3.2 g/dL (ref 1.5–4.5)
Glucose: 111 mg/dL — ABNORMAL HIGH (ref 70–99)
Potassium: 5.5 mmol/L — ABNORMAL HIGH (ref 3.5–5.2)
Sodium: 139 mmol/L (ref 134–144)
Total Protein: 7.2 g/dL (ref 6.0–8.5)
eGFR: 39 mL/min/1.73 — ABNORMAL LOW (ref >59)

## 2024-04-05 LAB — LIPID PANEL W/O CHOL/HDL RATIO
Cholesterol, Total: 121 mg/dL (ref 100–199)
HDL: 29 mg/dL — ABNORMAL LOW (ref >39)
LDL Chol Calc (NIH): 61 mg/dL (ref 0–99)
Triglycerides: 181 mg/dL — ABNORMAL HIGH (ref 0–149)
VLDL Cholesterol Cal: 31 mg/dL (ref 5–40)

## 2024-04-05 LAB — TSH+FREE T4
Free T4: 1.16 ng/dL (ref 0.82–1.77)
TSH: 3.78 u[IU]/mL (ref 0.450–4.500)

## 2024-04-05 LAB — URIC ACID: Uric Acid: 6.3 mg/dL (ref 3.8–8.4)

## 2024-04-07 ENCOUNTER — Other Ambulatory Visit: Payer: Self-pay | Admitting: Nurse Practitioner

## 2024-04-07 ENCOUNTER — Other Ambulatory Visit: Payer: Self-pay | Admitting: Family Medicine

## 2024-05-18 ENCOUNTER — Ambulatory Visit: Admitting: Family Medicine

## 2024-06-22 ENCOUNTER — Other Ambulatory Visit: Payer: Self-pay | Admitting: Nurse Practitioner

## 2024-08-01 ENCOUNTER — Ambulatory Visit: Admitting: Nurse Practitioner

## 2024-08-30 ENCOUNTER — Ambulatory Visit

## 2024-08-30 ENCOUNTER — Ambulatory Visit: Admitting: Family Medicine
